# Patient Record
Sex: Male | Born: 1975 | Race: Black or African American | Hispanic: No | Marital: Married | Smoking: Former smoker
Health system: Southern US, Community
[De-identification: ages and names within clinical notes are randomized; demographics above are authoritative.]

## PROBLEM LIST (undated history)

## (undated) DIAGNOSIS — G8250 Quadriplegia, unspecified: Secondary | ICD-10-CM

## (undated) DIAGNOSIS — G40909 Epilepsy, unspecified, not intractable, without status epilepticus: Secondary | ICD-10-CM

## (undated) DIAGNOSIS — N319 Neuromuscular dysfunction of bladder, unspecified: Secondary | ICD-10-CM

## (undated) DIAGNOSIS — T883XXA Malignant hyperthermia due to anesthesia, initial encounter: Secondary | ICD-10-CM

## (undated) DIAGNOSIS — N210 Calculus in bladder: Secondary | ICD-10-CM

## (undated) DIAGNOSIS — G35 Multiple sclerosis: Secondary | ICD-10-CM

## (undated) DIAGNOSIS — J069 Acute upper respiratory infection, unspecified: Secondary | ICD-10-CM

## (undated) DIAGNOSIS — J45909 Unspecified asthma, uncomplicated: Secondary | ICD-10-CM

## (undated) DIAGNOSIS — F329 Major depressive disorder, single episode, unspecified: Secondary | ICD-10-CM

## (undated) DIAGNOSIS — R0602 Shortness of breath: Secondary | ICD-10-CM

## (undated) DIAGNOSIS — D649 Anemia, unspecified: Secondary | ICD-10-CM

## (undated) DIAGNOSIS — F0280 Dementia in other diseases classified elsewhere without behavioral disturbance: Secondary | ICD-10-CM

## (undated) DIAGNOSIS — N390 Urinary tract infection, site not specified: Secondary | ICD-10-CM

## (undated) DIAGNOSIS — G709 Myoneural disorder, unspecified: Secondary | ICD-10-CM

## (undated) DIAGNOSIS — J690 Pneumonitis due to inhalation of food and vomit: Secondary | ICD-10-CM

## (undated) DIAGNOSIS — F32A Depression, unspecified: Secondary | ICD-10-CM

## (undated) HISTORY — DX: Multiple sclerosis: G35

## (undated) HISTORY — DX: Dementia in other diseases classified elsewhere without behavioral disturbance: F02.80

## (undated) HISTORY — DX: Quadriplegia, unspecified: G82.50

## (undated) HISTORY — PX: SUPRAPUBIC CYSTOSTOMY: SHX2472

---

## 1999-10-02 ENCOUNTER — Emergency Department (HOSPITAL_COMMUNITY): Admission: EM | Admit: 1999-10-02 | Discharge: 1999-10-02 | Payer: Self-pay | Admitting: Emergency Medicine

## 2001-12-13 ENCOUNTER — Emergency Department (HOSPITAL_COMMUNITY): Admission: EM | Admit: 2001-12-13 | Discharge: 2001-12-13 | Payer: Self-pay | Admitting: Emergency Medicine

## 2002-08-10 ENCOUNTER — Emergency Department (HOSPITAL_COMMUNITY): Admission: EM | Admit: 2002-08-10 | Discharge: 2002-08-10 | Payer: Self-pay | Admitting: *Deleted

## 2002-08-10 ENCOUNTER — Encounter: Payer: Self-pay | Admitting: *Deleted

## 2002-08-13 ENCOUNTER — Emergency Department (HOSPITAL_COMMUNITY): Admission: EM | Admit: 2002-08-13 | Discharge: 2002-08-13 | Payer: Self-pay | Admitting: Emergency Medicine

## 2002-08-28 ENCOUNTER — Encounter: Payer: Self-pay | Admitting: Internal Medicine

## 2002-08-28 ENCOUNTER — Encounter: Admission: RE | Admit: 2002-08-28 | Discharge: 2002-08-28 | Payer: Self-pay | Admitting: Internal Medicine

## 2002-08-30 ENCOUNTER — Encounter: Payer: Self-pay | Admitting: Internal Medicine

## 2002-08-30 ENCOUNTER — Encounter: Admission: RE | Admit: 2002-08-30 | Discharge: 2002-08-30 | Payer: Self-pay | Admitting: Internal Medicine

## 2002-10-12 ENCOUNTER — Ambulatory Visit (HOSPITAL_COMMUNITY): Admission: RE | Admit: 2002-10-12 | Discharge: 2002-10-12 | Payer: Self-pay | Admitting: Neurology

## 2002-10-12 HISTORY — PX: LUMBAR PUNCTURE: SHX1985

## 2003-02-24 ENCOUNTER — Encounter: Payer: Self-pay | Admitting: Cardiology

## 2003-02-24 ENCOUNTER — Ambulatory Visit: Admission: RE | Admit: 2003-02-24 | Discharge: 2003-02-24 | Payer: Self-pay | Admitting: Neurology

## 2003-08-11 ENCOUNTER — Encounter (HOSPITAL_COMMUNITY): Admission: RE | Admit: 2003-08-11 | Discharge: 2003-11-09 | Payer: Self-pay | Admitting: Neurology

## 2003-12-10 ENCOUNTER — Ambulatory Visit: Payer: Self-pay

## 2004-01-12 ENCOUNTER — Encounter (HOSPITAL_COMMUNITY): Admission: RE | Admit: 2004-01-12 | Discharge: 2004-04-11 | Payer: Self-pay | Admitting: Neurology

## 2004-11-04 ENCOUNTER — Encounter: Admission: RE | Admit: 2004-11-04 | Discharge: 2004-11-04 | Payer: Self-pay | Admitting: Neurology

## 2005-08-09 ENCOUNTER — Encounter (INDEPENDENT_AMBULATORY_CARE_PROVIDER_SITE_OTHER): Payer: Self-pay | Admitting: *Deleted

## 2005-08-09 ENCOUNTER — Ambulatory Visit (HOSPITAL_COMMUNITY): Admission: RE | Admit: 2005-08-09 | Discharge: 2005-08-09 | Payer: Self-pay | Admitting: Psychiatry

## 2005-09-05 ENCOUNTER — Encounter (HOSPITAL_COMMUNITY): Admission: RE | Admit: 2005-09-05 | Discharge: 2005-12-04 | Payer: Self-pay | Admitting: Neurology

## 2005-12-05 ENCOUNTER — Encounter: Admission: RE | Admit: 2005-12-05 | Discharge: 2005-12-05 | Payer: Self-pay | Admitting: Neurology

## 2005-12-12 ENCOUNTER — Encounter (HOSPITAL_COMMUNITY): Admission: RE | Admit: 2005-12-12 | Discharge: 2006-03-12 | Payer: Self-pay | Admitting: Neurology

## 2006-03-13 ENCOUNTER — Encounter (HOSPITAL_COMMUNITY): Admission: RE | Admit: 2006-03-13 | Discharge: 2006-06-11 | Payer: Self-pay | Admitting: Neurology

## 2006-06-06 ENCOUNTER — Encounter: Admission: RE | Admit: 2006-06-06 | Discharge: 2006-06-06 | Payer: Self-pay | Admitting: Neurology

## 2006-06-13 ENCOUNTER — Encounter (HOSPITAL_COMMUNITY): Admission: RE | Admit: 2006-06-13 | Discharge: 2006-09-11 | Payer: Self-pay | Admitting: Neurology

## 2006-09-19 ENCOUNTER — Encounter (HOSPITAL_COMMUNITY): Admission: RE | Admit: 2006-09-19 | Discharge: 2006-12-18 | Payer: Self-pay | Admitting: Neurology

## 2007-02-06 ENCOUNTER — Encounter (HOSPITAL_COMMUNITY): Admission: RE | Admit: 2007-02-06 | Discharge: 2007-05-07 | Payer: Self-pay | Admitting: Neurology

## 2007-05-14 ENCOUNTER — Encounter (HOSPITAL_COMMUNITY): Admission: RE | Admit: 2007-05-14 | Discharge: 2007-08-12 | Payer: Self-pay | Admitting: Neurology

## 2007-08-08 ENCOUNTER — Encounter: Admission: RE | Admit: 2007-08-08 | Discharge: 2007-08-08 | Payer: Self-pay | Admitting: Neurology

## 2007-10-02 ENCOUNTER — Encounter (HOSPITAL_COMMUNITY): Admission: RE | Admit: 2007-10-02 | Discharge: 2007-12-31 | Payer: Self-pay | Admitting: Neurology

## 2008-01-21 ENCOUNTER — Encounter (HOSPITAL_COMMUNITY): Admission: RE | Admit: 2008-01-21 | Discharge: 2008-04-20 | Payer: Self-pay | Admitting: Neurology

## 2008-04-30 ENCOUNTER — Encounter: Admission: RE | Admit: 2008-04-30 | Discharge: 2008-04-30 | Payer: Self-pay | Admitting: Neurology

## 2008-05-05 ENCOUNTER — Encounter (HOSPITAL_COMMUNITY): Admission: RE | Admit: 2008-05-05 | Discharge: 2008-08-03 | Payer: Self-pay | Admitting: Neurology

## 2008-08-09 ENCOUNTER — Encounter (HOSPITAL_COMMUNITY): Admission: RE | Admit: 2008-08-09 | Discharge: 2008-11-07 | Payer: Self-pay | Admitting: Neurology

## 2008-11-18 ENCOUNTER — Encounter (HOSPITAL_COMMUNITY): Admission: RE | Admit: 2008-11-18 | Discharge: 2009-01-07 | Payer: Self-pay | Admitting: Neurology

## 2009-01-11 ENCOUNTER — Encounter (HOSPITAL_COMMUNITY): Admission: RE | Admit: 2009-01-11 | Discharge: 2009-04-11 | Payer: Self-pay | Admitting: Neurology

## 2009-05-26 ENCOUNTER — Encounter: Admission: RE | Admit: 2009-05-26 | Discharge: 2009-05-26 | Payer: Self-pay | Admitting: Neurology

## 2009-06-02 ENCOUNTER — Encounter (HOSPITAL_COMMUNITY): Admission: RE | Admit: 2009-06-02 | Discharge: 2009-08-31 | Payer: Self-pay | Admitting: Neurology

## 2009-06-17 ENCOUNTER — Ambulatory Visit: Payer: Self-pay | Admitting: Physical Medicine & Rehabilitation

## 2009-08-21 ENCOUNTER — Inpatient Hospital Stay (HOSPITAL_COMMUNITY): Admission: EM | Admit: 2009-08-21 | Discharge: 2009-08-22 | Payer: Self-pay | Admitting: Emergency Medicine

## 2009-09-01 ENCOUNTER — Encounter (HOSPITAL_COMMUNITY)
Admission: RE | Admit: 2009-09-01 | Discharge: 2009-11-30 | Payer: Self-pay | Source: Home / Self Care | Admitting: Neurology

## 2009-12-15 ENCOUNTER — Inpatient Hospital Stay (HOSPITAL_COMMUNITY): Admission: EM | Admit: 2009-12-15 | Discharge: 2009-06-20 | Payer: Self-pay | Admitting: Emergency Medicine

## 2009-12-21 ENCOUNTER — Encounter (HOSPITAL_COMMUNITY)
Admission: RE | Admit: 2009-12-21 | Discharge: 2010-02-07 | Payer: Self-pay | Source: Home / Self Care | Attending: Neurology | Admitting: Neurology

## 2010-01-29 ENCOUNTER — Encounter: Payer: Self-pay | Admitting: Neurology

## 2010-01-30 ENCOUNTER — Encounter: Payer: Self-pay | Admitting: Neurology

## 2010-02-14 ENCOUNTER — Encounter (HOSPITAL_COMMUNITY): Payer: Medicare PPO | Attending: Neurology

## 2010-02-14 DIAGNOSIS — G35 Multiple sclerosis: Secondary | ICD-10-CM | POA: Insufficient documentation

## 2010-02-14 DIAGNOSIS — Z79899 Other long term (current) drug therapy: Secondary | ICD-10-CM | POA: Insufficient documentation

## 2010-03-14 ENCOUNTER — Encounter (HOSPITAL_COMMUNITY): Payer: Medicare PPO | Attending: Neurology

## 2010-03-14 ENCOUNTER — Encounter (HOSPITAL_COMMUNITY): Payer: Medicare PPO

## 2010-03-14 DIAGNOSIS — G35 Multiple sclerosis: Secondary | ICD-10-CM | POA: Insufficient documentation

## 2010-03-14 DIAGNOSIS — Z79899 Other long term (current) drug therapy: Secondary | ICD-10-CM | POA: Insufficient documentation

## 2010-03-23 LAB — GLUCOSE, CAPILLARY
Glucose-Capillary: 127 mg/dL — ABNORMAL HIGH (ref 70–99)
Glucose-Capillary: 157 mg/dL — ABNORMAL HIGH (ref 70–99)
Glucose-Capillary: 166 mg/dL — ABNORMAL HIGH (ref 70–99)
Glucose-Capillary: 172 mg/dL — ABNORMAL HIGH (ref 70–99)
Glucose-Capillary: 218 mg/dL — ABNORMAL HIGH (ref 70–99)

## 2010-03-23 LAB — BASIC METABOLIC PANEL
BUN: 10 mg/dL (ref 6–23)
CO2: 25 mEq/L (ref 19–32)
Calcium: 9.3 mg/dL (ref 8.4–10.5)
Chloride: 108 mEq/L (ref 96–112)
Creatinine, Ser: 1.07 mg/dL (ref 0.4–1.5)
GFR calc Af Amer: >60 mL/min (ref >60)
GFR calc non Af Amer: >60 mL/min (ref >60)
Glucose, Bld: 169 mg/dL — ABNORMAL HIGH (ref 70–99)
Potassium: 4.7 mEq/L (ref 3.5–5.1)
Sodium: 141 mEq/L (ref 135–145)

## 2010-03-23 LAB — CBC
HCT: 37 % — ABNORMAL LOW (ref 39.0–52.0)
Hemoglobin: 13.3 g/dL (ref 13.0–17.0)
MCH: 29.9 pg (ref 26.0–34.0)
MCHC: 35.9 g/dL (ref 30.0–36.0)
MCV: 83.1 fL (ref 78.0–100.0)
Platelets: 261 10*3/uL (ref 150–400)
RBC: 4.45 MIL/uL (ref 4.22–5.81)
RDW: 13.7 % (ref 11.5–15.5)
WBC: 16.5 10*3/uL — ABNORMAL HIGH (ref 4.0–10.5)

## 2010-03-24 LAB — CBC
HCT: 35.2 % — ABNORMAL LOW (ref 39.0–52.0)
MCH: 28.6 pg (ref 26.0–34.0)
MCHC: 34.4 g/dL (ref 30.0–36.0)
MCV: 83.1 fL (ref 78.0–100.0)
Platelets: 262 10*3/uL (ref 150–400)
RBC: 4.19 MIL/uL — ABNORMAL LOW (ref 4.22–5.81)
RDW: 13.5 % (ref 11.5–15.5)
RDW: 13.6 % (ref 11.5–15.5)
WBC: 20.4 10*3/uL — ABNORMAL HIGH (ref 4.0–10.5)

## 2010-03-24 LAB — COMPREHENSIVE METABOLIC PANEL
ALT: 34 U/L (ref 0–53)
Albumin: 3.2 g/dL — ABNORMAL LOW (ref 3.5–5.2)
Alkaline Phosphatase: 89 U/L (ref 39–117)
Chloride: 107 mEq/L (ref 96–112)
Glucose, Bld: 98 mg/dL (ref 70–99)
Potassium: 3.8 mEq/L (ref 3.5–5.1)
Sodium: 139 mEq/L (ref 135–145)
Total Bilirubin: 0.6 mg/dL (ref 0.3–1.2)
Total Protein: 6.2 g/dL (ref 6.0–8.3)

## 2010-03-24 LAB — URINE MICROSCOPIC-ADD ON

## 2010-03-24 LAB — BASIC METABOLIC PANEL
BUN: 9 mg/dL (ref 6–23)
CO2: 22 mEq/L (ref 19–32)
Calcium: 8.9 mg/dL (ref 8.4–10.5)
Chloride: 104 mEq/L (ref 96–112)
Creatinine, Ser: 1.02 mg/dL (ref 0.4–1.5)
GFR calc Af Amer: >60 mL/min (ref >60)
Glucose, Bld: 112 mg/dL — ABNORMAL HIGH (ref 70–99)

## 2010-03-24 LAB — DIFFERENTIAL
Basophils Absolute: 0 10*3/uL (ref 0.0–0.1)
Eosinophils Absolute: 0.3 10*3/uL (ref 0.0–0.7)
Eosinophils Relative: 1 % (ref 0–5)
Lymphs Abs: 3.5 10*3/uL (ref 0.7–4.0)
Monocytes Absolute: 2.8 10*3/uL — ABNORMAL HIGH (ref 0.1–1.0)
Neutrophils Relative %: 74 % (ref 43–77)

## 2010-03-24 LAB — URINALYSIS, ROUTINE W REFLEX MICROSCOPIC
Bilirubin Urine: NEGATIVE
Glucose, UA: NEGATIVE mg/dL
Ketones, ur: 15 mg/dL — AB
Nitrite: POSITIVE — AB
Specific Gravity, Urine: 1.021 (ref 1.005–1.030)
pH: 5.5 (ref 5.0–8.0)

## 2010-03-24 LAB — URINE CULTURE: Colony Count: >=100000

## 2010-03-24 LAB — LIPID PANEL
Cholesterol: 151 mg/dL (ref 0–200)
Total CHOL/HDL Ratio: 3.1 RATIO
VLDL: 11 mg/dL (ref 0–40)

## 2010-03-24 LAB — CULTURE, BLOOD (ROUTINE X 2)

## 2010-03-24 LAB — TSH: TSH: 1.126 u[IU]/mL (ref 0.350–4.500)

## 2010-03-24 LAB — GLUCOSE, CAPILLARY: Glucose-Capillary: 90 mg/dL (ref 70–99)

## 2010-03-27 LAB — COMPREHENSIVE METABOLIC PANEL
Albumin: 3.3 g/dL — ABNORMAL LOW (ref 3.5–5.2)
Alkaline Phosphatase: 90 U/L (ref 39–117)
BUN: 10 mg/dL (ref 6–23)
BUN: 9 mg/dL (ref 6–23)
Chloride: 107 mEq/L (ref 96–112)
Chloride: 108 mEq/L (ref 96–112)
Creatinine, Ser: 0.91 mg/dL (ref 0.4–1.5)
GFR calc non Af Amer: >60 mL/min (ref >60)
Glucose, Bld: 134 mg/dL — ABNORMAL HIGH (ref 70–99)
Glucose, Bld: 152 mg/dL — ABNORMAL HIGH (ref 70–99)
Potassium: 4.6 mEq/L (ref 3.5–5.1)
Total Bilirubin: 0.6 mg/dL (ref 0.3–1.2)
Total Bilirubin: 0.7 mg/dL (ref 0.3–1.2)
Total Protein: 7.3 g/dL (ref 6.0–8.3)

## 2010-03-27 LAB — CBC
HCT: 38.6 % — ABNORMAL LOW (ref 39.0–52.0)
HCT: 41.7 % (ref 39.0–52.0)
Hemoglobin: 12.9 g/dL — ABNORMAL LOW (ref 13.0–17.0)
Hemoglobin: 14 g/dL (ref 13.0–17.0)
Hemoglobin: 14.5 g/dL (ref 13.0–17.0)
MCV: 87.5 fL (ref 78.0–100.0)
Platelets: 295 10*3/uL (ref 150–400)
RBC: 4.92 MIL/uL (ref 4.22–5.81)
RDW: 13.2 % (ref 11.5–15.5)
RDW: 13.5 % (ref 11.5–15.5)
RDW: 14 % (ref 11.5–15.5)
WBC: 11.6 10*3/uL — ABNORMAL HIGH (ref 4.0–10.5)
WBC: 16.3 10*3/uL — ABNORMAL HIGH (ref 4.0–10.5)

## 2010-03-27 LAB — URINE CULTURE: Special Requests: NEGATIVE

## 2010-03-27 LAB — DIFFERENTIAL
Basophils Absolute: 0.1 10*3/uL (ref 0.0–0.1)
Lymphocytes Relative: 24 % (ref 12–46)
Monocytes Absolute: 0.8 10*3/uL (ref 0.1–1.0)
Neutro Abs: 7.2 10*3/uL (ref 1.7–7.7)

## 2010-03-27 LAB — URINALYSIS, ROUTINE W REFLEX MICROSCOPIC
Nitrite: NEGATIVE
Specific Gravity, Urine: 1.033 — ABNORMAL HIGH (ref 1.005–1.030)
Urobilinogen, UA: 0.2 mg/dL (ref 0.0–1.0)

## 2010-03-27 LAB — URINALYSIS, MICROSCOPIC ONLY
Hgb urine dipstick: NEGATIVE
Leukocytes, UA: NEGATIVE
Nitrite: POSITIVE — AB
Protein, ur: NEGATIVE mg/dL
Specific Gravity, Urine: 1.02 (ref 1.005–1.030)
Urobilinogen, UA: 0.2 mg/dL (ref 0.0–1.0)

## 2010-03-27 LAB — BASIC METABOLIC PANEL
Calcium: 9.2 mg/dL (ref 8.4–10.5)
GFR calc Af Amer: >60 mL/min (ref >60)
GFR calc non Af Amer: >60 mL/min (ref >60)
Glucose, Bld: 93 mg/dL (ref 70–99)
Sodium: 138 mEq/L (ref 135–145)

## 2010-03-27 LAB — MAGNESIUM: Magnesium: 2.5 mg/dL (ref 1.5–2.5)

## 2010-03-27 LAB — PREALBUMIN: Prealbumin: 31.1 mg/dL (ref 18.0–45.0)

## 2010-03-29 LAB — DIFFERENTIAL
Basophils Absolute: 0.1 10*3/uL (ref 0.0–0.1)
Basophils Relative: 1 % (ref 0–1)
Eosinophils Absolute: 1.4 10*3/uL — ABNORMAL HIGH (ref 0.0–0.7)
Monocytes Relative: 6 % (ref 3–12)
Neutro Abs: 6 10*3/uL (ref 1.7–7.7)
Neutrophils Relative %: 53 % (ref 43–77)

## 2010-03-29 LAB — COMPREHENSIVE METABOLIC PANEL
ALT: 36 U/L (ref 0–53)
Alkaline Phosphatase: 91 U/L (ref 39–117)
BUN: 8 mg/dL (ref 6–23)
CO2: 24 mEq/L (ref 19–32)
Chloride: 104 mEq/L (ref 96–112)
Glucose, Bld: 98 mg/dL (ref 70–99)
Potassium: 4.1 mEq/L (ref 3.5–5.1)
Sodium: 138 mEq/L (ref 135–145)
Total Bilirubin: 0.7 mg/dL (ref 0.3–1.2)
Total Protein: 7.9 g/dL (ref 6.0–8.3)

## 2010-03-29 LAB — CBC
HCT: 43.1 % (ref 39.0–52.0)
Hemoglobin: 15 g/dL (ref 13.0–17.0)
RBC: 5.01 MIL/uL (ref 4.22–5.81)
WBC: 11.4 10*3/uL — ABNORMAL HIGH (ref 4.0–10.5)

## 2010-04-11 ENCOUNTER — Encounter (HOSPITAL_COMMUNITY): Payer: Medicare PPO

## 2010-04-18 LAB — CD4/CD8 (T-HELPER/T-SUPPRESSOR CELL)
CD4 absolute: 1630 /uL (ref 500–1900)
CD4%: 44 % (ref 30.0–60.0)
Total lymphocyte count: 3670 /uL (ref 1000–4000)

## 2010-04-18 LAB — COMPREHENSIVE METABOLIC PANEL
Alkaline Phosphatase: 91 U/L (ref 39–117)
BUN: 11 mg/dL (ref 6–23)
Chloride: 107 mEq/L (ref 96–112)
Creatinine, Ser: 0.85 mg/dL (ref 0.4–1.5)
GFR calc non Af Amer: >60 mL/min (ref >60)
Glucose, Bld: 89 mg/dL (ref 70–99)
Potassium: 4.2 mEq/L (ref 3.5–5.1)
Total Bilirubin: 0.7 mg/dL (ref 0.3–1.2)

## 2010-04-18 LAB — DIFFERENTIAL
Basophils Absolute: 0.2 10*3/uL — ABNORMAL HIGH (ref 0.0–0.1)
Basophils Relative: 2 % — ABNORMAL HIGH (ref 0–1)
Lymphocytes Relative: 32 % (ref 12–46)
Neutro Abs: 4.9 10*3/uL (ref 1.7–7.7)
Neutrophils Relative %: 43 % (ref 43–77)

## 2010-04-18 LAB — CBC
Hemoglobin: 14.1 g/dL (ref 13.0–17.0)
MCHC: 34.4 g/dL (ref 30.0–36.0)
RBC: 4.76 MIL/uL (ref 4.22–5.81)
RDW: 13.3 % (ref 11.5–15.5)

## 2010-04-18 LAB — MISCELLANEOUS TEST

## 2010-04-20 ENCOUNTER — Encounter (HOSPITAL_COMMUNITY): Payer: Medicare PPO

## 2010-05-08 ENCOUNTER — Encounter (HOSPITAL_COMMUNITY): Payer: Medicare PPO

## 2010-05-23 ENCOUNTER — Encounter (HOSPITAL_COMMUNITY): Payer: Medicare PPO

## 2010-05-26 NOTE — Op Note (Signed)
   NAME:  MEHTAAB, MAYEDA NO.:  [id number]   MEDICAL RECORD NO.:  [id number]                   PATIENT TYPE:  OUT   LOCATION:  MDC                                  FACILITY:  [facility]   PHYSICIAN:  Marlan Palau, M.D.               DATE OF BIRTH:  1975/06/08   DATE OF PROCEDURE:  10/12/2002  DATE OF DISCHARGE:                                 OPERATIVE REPORT   PROCEDURE PERFORMED:  Lumbar puncture.   INDICATIONS FOR PROCEDURE:  The patient is a 35 year old patient with a  history of progressive gait disturbance, dizziness with multiple white  matter lesions by MRI scan.  Patient being evaluated for possible multiple  sclerosis.   DESCRIPTION OF PROCEDURE:  The lumbar puncture was performed with the  patient in the fetal position on the left side.  The low back was cleaned  with Betadine solution.  Approximately 2 mL of 1% Xylocaine with epinephrine  was used a local anesthetic.  A 20 guage spinal needle was inserted in the  L3-4 interspace and approximately 18mL of clear colorless spinal fluid was  removed for testing. Opening pressure was 150 mmHg.  Again spinal fluid was  clear and colorless.  Tube #1 was sent for VDRL, cryptococcal antigen,  angiotensin converting enzyme level.  Tube #2 was sent for oligoclonal  banding, IgG, albumin ratio.  Tube #3 was sent for cells, differential,  glucose, protein, tube #4 was sent for Lyme antibody panel.  Blood work was  sent for ANA, rheumatoid factor, lupus anticoagulant, antiphospholipid  antibody panel, sed rate, HIV antibodies.  There were no complications to  the above procedure.  The patient tolerated the procedure well.                                               Marlan Palau, M.D.    CKW/MEDQ  D:  10/12/2002  T:  10/12/2002  Job:  [id number]

## 2010-05-30 ENCOUNTER — Encounter (HOSPITAL_COMMUNITY)
Admission: RE | Admit: 2010-05-30 | Discharge: 2010-05-30 | Disposition: A | Discharge status: Home / Self Care | Payer: Medicare PPO | Source: Ambulatory Visit | Attending: Neurology | Admitting: Neurology

## 2010-05-30 DIAGNOSIS — G35 Multiple sclerosis: Secondary | ICD-10-CM | POA: Insufficient documentation

## 2010-06-27 ENCOUNTER — Encounter (HOSPITAL_COMMUNITY): Payer: Medicare PPO

## 2010-07-03 ENCOUNTER — Encounter (HOSPITAL_COMMUNITY): Payer: Medicare PPO | Attending: Neurology

## 2010-07-03 DIAGNOSIS — G35 Multiple sclerosis: Secondary | ICD-10-CM | POA: Insufficient documentation

## 2010-07-06 ENCOUNTER — Emergency Department (HOSPITAL_COMMUNITY)
Admission: EM | Admit: 2010-07-06 | Discharge: 2010-07-06 | Disposition: A | Discharge status: Home / Self Care | Payer: Medicare PPO | Attending: Emergency Medicine | Admitting: Emergency Medicine

## 2010-07-06 DIAGNOSIS — G35 Multiple sclerosis: Secondary | ICD-10-CM | POA: Insufficient documentation

## 2010-07-06 DIAGNOSIS — Z79899 Other long term (current) drug therapy: Secondary | ICD-10-CM | POA: Insufficient documentation

## 2010-07-06 DIAGNOSIS — K089 Disorder of teeth and supporting structures, unspecified: Secondary | ICD-10-CM | POA: Insufficient documentation

## 2010-07-25 ENCOUNTER — Other Ambulatory Visit: Payer: Self-pay | Admitting: Neurology

## 2010-07-25 DIAGNOSIS — G35 Multiple sclerosis: Secondary | ICD-10-CM

## 2010-07-31 ENCOUNTER — Encounter (HOSPITAL_COMMUNITY): Payer: Medicare PPO

## 2010-08-04 ENCOUNTER — Encounter (HOSPITAL_COMMUNITY): Payer: Medicare PPO

## 2010-08-10 ENCOUNTER — Ambulatory Visit
Admission: RE | Admit: 2010-08-10 | Discharge: 2010-08-10 | Disposition: A | Discharge status: Home / Self Care | Payer: Medicare PPO | Source: Ambulatory Visit | Attending: Neurology | Admitting: Neurology

## 2010-08-10 DIAGNOSIS — G35 Multiple sclerosis: Secondary | ICD-10-CM

## 2010-08-10 MED ORDER — GADOBENATE DIMEGLUMINE 529 MG/ML IV SOLN
18.0000 mL | Freq: Once | INTRAVENOUS | Status: AC | PRN
  Administered 2010-08-10: 18 mL via INTRAVENOUS

## 2010-10-03 LAB — CD4/CD8 (T-HELPER/T-SUPPRESSOR CELL)
CD4 absolute: 1190
CD4%: 39
CD8tox: 28
Total lymphocyte count: 3050

## 2010-10-03 LAB — COMPREHENSIVE METABOLIC PANEL
ALT: 21
BUN: 10
CO2: 26
Calcium: 9.1
GFR calc non Af Amer: >60
Glucose, Bld: 87
Sodium: 136

## 2010-10-03 LAB — CBC
MCV: 83.1
Platelets: 299
WBC: 10.1

## 2010-10-03 LAB — DIFFERENTIAL
Basophils Relative: 1
Eosinophils Absolute: 0.9 — ABNORMAL HIGH
Lymphs Abs: 3.3
Neutrophils Relative %: 50

## 2010-10-15 ENCOUNTER — Emergency Department (HOSPITAL_COMMUNITY)
Admission: EM | Admit: 2010-10-15 | Discharge: 2010-10-15 | Disposition: A | Discharge status: Home / Self Care | Payer: Medicare HMO | Attending: Emergency Medicine | Admitting: Emergency Medicine

## 2010-10-15 DIAGNOSIS — G35 Multiple sclerosis: Secondary | ICD-10-CM | POA: Insufficient documentation

## 2010-10-15 DIAGNOSIS — R339 Retention of urine, unspecified: Secondary | ICD-10-CM | POA: Insufficient documentation

## 2010-10-15 DIAGNOSIS — Z993 Dependence on wheelchair: Secondary | ICD-10-CM | POA: Insufficient documentation

## 2010-10-15 DIAGNOSIS — R350 Frequency of micturition: Secondary | ICD-10-CM | POA: Insufficient documentation

## 2010-10-15 LAB — CBC
Platelets: 302 10*3/uL (ref 150–400)
RDW: 13 % (ref 11.5–15.5)
WBC: 8.9 10*3/uL (ref 4.0–10.5)

## 2010-10-15 LAB — URINALYSIS, ROUTINE W REFLEX MICROSCOPIC
Ketones, ur: NEGATIVE mg/dL
Leukocytes, UA: NEGATIVE
Nitrite: NEGATIVE
Protein, ur: NEGATIVE mg/dL
pH: 6 (ref 5.0–8.0)

## 2010-10-15 LAB — BASIC METABOLIC PANEL
BUN: 9 mg/dL (ref 6–23)
Chloride: 100 mEq/L (ref 96–112)
Creatinine, Ser: 0.89 mg/dL (ref 0.50–1.35)
GFR calc Af Amer: >90 mL/min (ref >90)
GFR calc non Af Amer: >90 mL/min (ref >90)
Potassium: 3.7 mEq/L (ref 3.5–5.1)

## 2010-10-15 LAB — DIFFERENTIAL
Basophils Absolute: 0.1 10*3/uL (ref 0.0–0.1)
Eosinophils Absolute: 0.6 10*3/uL (ref 0.0–0.7)
Eosinophils Relative: 7 % — ABNORMAL HIGH (ref 0–5)

## 2010-10-15 LAB — OCCULT BLOOD, POC DEVICE: Fecal Occult Bld: POSITIVE

## 2010-10-16 LAB — URINE CULTURE: Colony Count: NO GROWTH

## 2010-10-20 ENCOUNTER — Other Ambulatory Visit (HOSPITAL_COMMUNITY): Payer: Medicare PPO

## 2010-10-20 ENCOUNTER — Emergency Department (HOSPITAL_COMMUNITY)
Admission: EM | Admit: 2010-10-20 | Discharge: 2010-10-20 | Disposition: A | Discharge status: Other Acute Inpatient Hospital | Payer: Medicare PPO | Source: Home / Self Care | Attending: Emergency Medicine | Admitting: Emergency Medicine

## 2010-10-20 ENCOUNTER — Inpatient Hospital Stay (HOSPITAL_COMMUNITY)
Admission: AD | Admit: 2010-10-20 | Discharge: 2010-10-31 | DRG: 060 | Disposition: A | Discharge status: Home Health | Payer: Medicare PPO | Source: Other Acute Inpatient Hospital | Attending: Neurology | Admitting: Neurology

## 2010-10-20 ENCOUNTER — Emergency Department (HOSPITAL_COMMUNITY): Payer: Medicare PPO

## 2010-10-20 DIAGNOSIS — G35 Multiple sclerosis: Secondary | ICD-10-CM | POA: Insufficient documentation

## 2010-10-20 DIAGNOSIS — Z79899 Other long term (current) drug therapy: Secondary | ICD-10-CM

## 2010-10-20 DIAGNOSIS — R5381 Other malaise: Secondary | ICD-10-CM | POA: Diagnosis present

## 2010-10-20 DIAGNOSIS — R4789 Other speech disturbances: Secondary | ICD-10-CM | POA: Insufficient documentation

## 2010-10-20 DIAGNOSIS — Z993 Dependence on wheelchair: Secondary | ICD-10-CM

## 2010-10-20 LAB — CBC
HCT: 40.3 % (ref 39.0–52.0)
Hemoglobin: 14.1 g/dL (ref 13.0–17.0)
MCH: 29.3 pg (ref 26.0–34.0)
MCHC: 35 g/dL (ref 30.0–36.0)
MCV: 83.6 fL (ref 78.0–100.0)
RBC: 4.82 MIL/uL (ref 4.22–5.81)

## 2010-10-20 LAB — COMPREHENSIVE METABOLIC PANEL
AST: 26 U/L (ref 0–37)
BUN: 8 mg/dL (ref 6–23)
CO2: 26 mEq/L (ref 19–32)
Chloride: 102 mEq/L (ref 96–112)
Creatinine, Ser: 0.74 mg/dL (ref 0.50–1.35)
GFR calc Af Amer: >90 mL/min (ref >90)
GFR calc non Af Amer: >90 mL/min (ref >90)
Glucose, Bld: 116 mg/dL — ABNORMAL HIGH (ref 70–99)
Total Bilirubin: 0.2 mg/dL — ABNORMAL LOW (ref 0.3–1.2)

## 2010-10-20 LAB — DIFFERENTIAL
Lymphs Abs: 1.9 10*3/uL (ref 0.7–4.0)
Monocytes Absolute: 0.8 10*3/uL (ref 0.1–1.0)
Monocytes Relative: 9 % (ref 3–12)
Neutro Abs: 5.6 10*3/uL (ref 1.7–7.7)
Neutrophils Relative %: 63 % (ref 43–77)

## 2010-10-20 LAB — URINALYSIS, ROUTINE W REFLEX MICROSCOPIC
Bilirubin Urine: NEGATIVE
Glucose, UA: NEGATIVE mg/dL
Hgb urine dipstick: NEGATIVE
Ketones, ur: NEGATIVE mg/dL
Leukocytes, UA: NEGATIVE
Protein, ur: NEGATIVE mg/dL
pH: 6 (ref 5.0–8.0)

## 2010-10-22 NOTE — H&P (Signed)
NAMEMORTY, ORTWEIN NO.:  [id number]  MEDICAL RECORD NO.:  [id number]  LOCATION:  [facility]LED                         FACILITY:  [facility]  PHYSICIAN:  Weston Settle, MD   DATE OF BIRTH:  02-Apr-1975  DATE OF ADMISSION:  10/20/2010 DATE OF DISCHARGE:  10/20/2010                             HISTORY & PHYSICAL   HISTORY OF PRESENT ILLNESS:  The patient is a 35 year old African American male who is admitted to our service.  No family is here to corroborate details.  The patient is a reliable historian and the ER notes also gave further history.  According to the family, the patient has had a few-week history of progressively getting worse in a nonspecific diffuse manner.  He used to be able to use his upper extremities and feed himself, but he has had more difficulty doing that recently and has had more difficulty with his speech.  He has a history of multiple sclerosis diagnosed 5 years ago.  He says that the spinal tap at that time was positive, although we have no records.  Initially, he was on Copaxone, but he had some relapses which caused his physicians to switch him to [place].  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 For the past 3 years, the patient has been wheelchair bound with inability to even use a walker on his own.  He has also had increased difficulty with urination and expelling his urine recently.  Upon arrival here, the patient had an MRI of the brain which I reviewed personally.  It indicates periventricular and subcortical white matter changes which are relatively unchanged since his prior MRI in August 2011.  There are multiple corpus callosal lesions as well in a Dawson finger-type pattern.  MRI of the cervical spine was also performed, but it is significantly degraded by motion artifact and difficult  to analyze.  According to the prior cervical spine MRI in August, 2011, there were some lesions in the spinal cord, which were rather vague, but radiologist thought they were present.  Unfortunately on this admission, the patient could not tolerate the contrast imaging and we do not have any postcontrast images.  His CBC and complete metabolic panel and urinalysis are normal.  The patient has not been sick recently.  PAST MEDICAL HISTORY:  Multiple sclerosis.  PAST SURGICAL HISTORY:  Negative.  MEDICATIONS AT HOME: 1. Adderall. 2. Baclofen. 3. Prozac. 4. Tysabri until recently.  ALLERGIES:  No known drug allergies.  SOCIAL HISTORY:  The patient does not smoke, drink, or use any illicit drugs.  FAMILY HISTORY:  Noncontributory.  REVIEW OF SYSTEMS:  No fever, no meningismus, no diplopia, no dysphagia, no chest pain, no shortness of breath, no diarrhea or constipation.  All other review of systems are negative.  PHYSICAL EXAMINATION:  VITAL SIGNS:  His blood pressure was 117/75, pulse of 87, temperature 97.5. HEART:  Regular rate and rhythm.  S1, S2.  No murmurs. LUNGS:  Clear to auscultation.  There are no carotid bruits. NEUROLOGIC:  He  is very sleepy, but does awaken to stimulation.  He is disoriented to the year, the month, or the city, but knows the Economist.  His language is fluent.  Comprehension, naming, and repetition are intact.  Pupils are equal and reactive.  I do not see any obvious afferent pupillary defect.  Extraocular movements are intact. Face appears to have slightly left lower facial droop.  Tongue is midline.  Palate raises symmetrically.  There is no atrophy or fasciculations in the tongue.  He is able to have about a 4/5 strength in the bilateral upper extremities.  Lower extremities are about 3/5 in strength at best in all muscle groups.  He does have significant clonus at the ankles and +3 reflexes throughout the lower and upper extremities.  There  are bilateral Babinski signs present and there is equivocal Hoffman sign present bilaterally.  Sensory testing is grossly intact and coordination testing is impaired with some dysmetria bilaterally.  Gait testing was deferred at this time due to safety reasons.  ASSESSMENT AND PLAN:  This is a 35 year old with multiple sclerosis, who has progressed rather rapidly over the last 5 years and is wheelchair bound.  This admission is not consistent with any specific clear exacerbation of his multiple sclerosis and his MRI appears to be relatively stable compared to the prior MRI, although he could not tolerate post-contrast imaging.  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  Due to this reason and the fact that there is no clear exacerbation referable to a specific area of the nervous system, I hesitate to initiate empiric intravenous steroids.  I doubt that his current decompensation is related to the presence of progressive multifocal leukoencephalopathy.  However, strong consideration can be given to initiating plasmapheresis depending on his clinical course here in the hospital.  I will recommend extensive physical therapy and occupational therapy and speech therapy.  I will increase his baclofen to a maximum dose of 20 mg 4 times a day.  I will continue his Prozac at 20 mg a day.  I will also continue his Adderall at 20 mg every morning.  These can be readjusted based on his clinical course.  Due to the fact that he has some difficulty initiating urination and may have some urinary retention, I will initiate a Foley catheter for him.  At the present time, there is no evidence that he has any urinary tract infection.          ______________________________ Weston Settle, MD     SE/MEDQ  D:  10/20/2010  T:  10/21/2010   Job:  [id number]  Electronically Signed by Weston Settle MD on 10/22/2010 11:27:25 PM

## 2010-10-23 DIAGNOSIS — G35 Multiple sclerosis: Secondary | ICD-10-CM

## 2010-10-24 LAB — CBC
MCH: 28.6 pg (ref 26.0–34.0)
MCHC: 33.7 g/dL (ref 30.0–36.0)
Platelets: 294 10*3/uL (ref 150–400)
RBC: 4.93 MIL/uL (ref 4.22–5.81)

## 2010-10-24 LAB — BASIC METABOLIC PANEL
CO2: 24 mEq/L (ref 19–32)
Calcium: 9.9 mg/dL (ref 8.4–10.5)
GFR calc non Af Amer: >90 mL/min (ref >90)
Potassium: 4.8 mEq/L (ref 3.5–5.1)
Sodium: 141 mEq/L (ref 135–145)

## 2010-10-27 LAB — URINE CULTURE: Special Requests: NEGATIVE

## 2010-10-31 ENCOUNTER — Emergency Department (HOSPITAL_COMMUNITY)
Admission: EM | Admit: 2010-10-31 | Discharge: 2010-11-01 | Disposition: A | Discharge status: Home / Self Care | Payer: Medicare HMO | Attending: Emergency Medicine | Admitting: Emergency Medicine

## 2010-10-31 DIAGNOSIS — G35 Multiple sclerosis: Secondary | ICD-10-CM | POA: Insufficient documentation

## 2010-10-31 DIAGNOSIS — Z79899 Other long term (current) drug therapy: Secondary | ICD-10-CM | POA: Insufficient documentation

## 2010-10-31 DIAGNOSIS — R339 Retention of urine, unspecified: Secondary | ICD-10-CM | POA: Insufficient documentation

## 2010-10-31 DIAGNOSIS — N390 Urinary tract infection, site not specified: Secondary | ICD-10-CM | POA: Insufficient documentation

## 2010-10-31 DIAGNOSIS — R319 Hematuria, unspecified: Secondary | ICD-10-CM | POA: Insufficient documentation

## 2010-10-31 LAB — URINE MICROSCOPIC-ADD ON

## 2010-10-31 LAB — URINALYSIS, ROUTINE W REFLEX MICROSCOPIC
Specific Gravity, Urine: 1.011 (ref 1.005–1.030)
pH: 6.5 (ref 5.0–8.0)

## 2010-11-04 LAB — URINE CULTURE

## 2010-11-05 NOTE — Consult Note (Signed)
  NAME:  Terry Palmer, Terry Palmer NO.:  [id number]  MEDICAL RECORD NO.:  [id number]  LOCATION:                                 FACILITY:  PHYSICIAN:  Thana Farr, MD    DATE OF BIRTH:  12-14-1975  DATE OF CONSULTATION: DATE OF DISCHARGE:                                CONSULTATION   ADDENDUM  Due to complications with transportation, it was necessary to keep the patient in the hospital for one extended day.  During the extended day of hospitalization, the patient had no alterations in physical exam and no increasing weakness.  In fact, the patient states that he feels slightly stronger in bilateral upper extremities.  He is eating well, urinating, and defecating without any difficulty.  The patient will be discharged home as initially dictated today at 4 p.m.     Felicie Morn, PA-C   ______________________________ Thana Farr, MD    DS/MEDQ  D:  10/31/2010  T:  11/01/2010  Job:  [id number]  Electronically Signed by Felicie Morn PA-C on 11/02/2010 10:43:32 AM Electronically Signed by Thana Farr MD on 11/05/2010 05:21:48 PM

## 2010-11-27 NOTE — Discharge Summary (Signed)
Terry Palmer, Terry Palmer NO.:  [id number]  MEDICAL RECORD NO.:  [id number]  LOCATION:  3010                         FACILITY:  [facility]  PHYSICIAN:  Thana Farr, MD    DATE OF BIRTH:  07-12-1975  DATE OF ADMISSION:  10/20/2010 DATE OF DISCHARGE:  10/30/2010                              DISCHARGE SUMMARY   ADMITTING DIAGNOSIS:  Multiple sclerosis exacerbation.  DISCHARGE DIAGNOSIS:  Multiple sclerosis exacerbation.  HISTORY OF PRESENT ILLNESS:  This is a 35 year old male who was admitted to [facility].  The patient has a known history of multiple sclerosis and is currently on medications Adderall, baclofen, Prozac, and Tysabri until recently.  The patient is followed by [facility] as an outpatient.  The patient was doing well until few days prior to admission when he noticed his upper extremities were getting weaker and he had difficulty feeding himself.  He was also having progressive difficulty with his speech.  As stated he does have a history of multiple sclerosis diagnosed approximately 5 years ago. Initially the patient was on Copaxone but had some relapses and then was switched to Tysabri.  Recently he was found to be positive for JC virus and was about to be switched to Betaseron but had not yet had his first dose.  Over the past 3 years, the patient has been wheelchair bound with inability even using a walker on his own.  Upon arrival to the [facility], the patient's MRI of the brain indicate periventricular and subcortical white matter changes relatively unchanged since prior MRI in August 2011.  MRI of cervical spine was also performed but had significant degradation secondary to motion.  According to the prior cervical MRI in August 2011, there were some lesions in the spinal cord, but it was rather vague in the radiologist presentation.  During hospitalization, the patient received 6 doses of 1 g of  Solu- Medrol with significant improvement.  The patient was admitted on a home dose of baclofen 20 mg b.i.d. but was changed while hospitalized to baclofen 10 mg t.i.d. with improvement of lower extremity spasticity. Physical therapy saw the patient during hospitalization and noted recommendations of outpatient rehabilitation at [facility].  Dietitian saw the patient and recommended possibility of using nutritional supplements to prevent muscle mass loss if the patient continues to have difficulty with swallowing in the future.  Inpatient rehab evaluated the patient while he was on the floor, however, the patient did not meet the requirements needed for inpatient rehabilitation as he could not tolerate the duration of therapy that would be needed for inpatient rehab which consisted of 3 hours of therapy for 5 days a week.  Speech therapy saw the patient while in the hospital and obtained a FEES.  Their recommendation at that time was to place the patient on a dysphagia II thin liquid diet.  During FEES, no overt aspiration was noted.  Social worker worked very closely with the patient and family and recommended the possibility of both a skilled nursing facility versus home health.  At this time, family has decided that home health would be their desire and the patient would  be most desirable.  DISCHARGE PHYSICAL EXAMINATION:  The patient is awake.  He is oriented. He has slight facial asymmetry on the left aspect of his corner of his mouth.  Smile is fairly symmetrical.  Speech is slightly dysarthric, but he is able to hold conversations.  He shows no aphasia.  Pupils are equal, round, reactive to light.  Extraocular movements are grossly intact.  He does have slightly saccadic eye movement but is able to move his extraocular movements in all 6 cardinal gazes.  Vision is grossly intact.  The patient is able to hold his head upright without any difficulty.   Shoulder shrug head turn is within normal limits.  [name] strength in his right arm, his antigravity, however, with finger-to-nose notes that he gets weak very easily with the proximal aspect of his shoulder strength.  He does show some dysmetria with finger-nose on his right secondary to weakness.  His left arm is antigravity but weaker than his right.  He is in a cock-up wrist splint on his left hand to prevent spasticity.  His grip is slightly weaker on his left than his right, but he is moving bilateral upper extremities spontaneously and purposefully.  He states sensation is grossly intact throughout.  The patient shows no spontaneous movement of his lower extremities.  He does have spastic jerking of his lower extremities at times.  He has got significant amount of muscle atrophy in bilateral lower extremities.  Sensation is grossly intact.  Deep tendon reflexes are brisk throughout.  He does have upgoing toes bilaterally and clonus of the patella and Achilles bilaterally.  DISCHARGE MEDICATIONS:  The patient will be discharged on the following: 1. Automatic hospital bed.  Diagnosis for advanced multiple sclerosis     as the patient requires maximal to total assist for transfers,     ADLs. 2. Baclofen 10 mg t.i.d. by mouth. 3. Amphetamine/dextroamphetamine 20 mg 1 tab by mouth daily. 4. Fiber supplements. 5. Fish oil 1 capsule by mouth daily. 6. Fluoxetine 20 mg 1 cap by mouth daily. 7. Multivitamins 1 tab by mouth daily. 8. B12 1 tab by mouth daily. 9. Vitamin D3 over-the-counter 1 tab by mouth daily. 10.Scripts written for baclofen 10 mg tab, 1 tab by mouth 3 times     daily, dispensed #100, refills none.  DISPOSITION:  The patient will be discharged home with home health for physical therapy, occupational therapy, and speech therapy.  The orders have been written in the chart.  In addition, the patient has a followup appointment with Dr. Tinnie Gens at [facility] on October 29 at 2012.  The patient acknowledges this and family also acknowledges this.     Felicie Morn, PA-C   ______________________________ Thana Farr, MD    DS/MEDQ  D:  10/30/2010  T:  10/30/2010  Job:  [id number]  cc:   Thana Farr, MD  Electronically Signed by Felicie Morn PA-C on 11/27/2010 10:39:41 AM Electronically Signed by Thana Farr MD on 11/27/2010 10:41:29 AM

## 2010-11-30 ENCOUNTER — Emergency Department (HOSPITAL_COMMUNITY)
Admission: EM | Admit: 2010-11-30 | Discharge: 2010-12-01 | Disposition: A | Discharge status: Home / Self Care | Payer: Medicare HMO | Attending: Emergency Medicine | Admitting: Emergency Medicine

## 2010-11-30 ENCOUNTER — Encounter: Payer: Self-pay | Admitting: Emergency Medicine

## 2010-11-30 DIAGNOSIS — T8389XA Other specified complication of genitourinary prosthetic devices, implants and grafts, initial encounter: Secondary | ICD-10-CM | POA: Insufficient documentation

## 2010-11-30 DIAGNOSIS — R42 Dizziness and giddiness: Secondary | ICD-10-CM | POA: Insufficient documentation

## 2010-11-30 DIAGNOSIS — G35 Multiple sclerosis: Secondary | ICD-10-CM | POA: Insufficient documentation

## 2010-11-30 DIAGNOSIS — T83011A Breakdown (mechanical) of indwelling urethral catheter, initial encounter: Secondary | ICD-10-CM

## 2010-11-30 DIAGNOSIS — Z79899 Other long term (current) drug therapy: Secondary | ICD-10-CM | POA: Insufficient documentation

## 2010-11-30 DIAGNOSIS — Y849 Medical procedure, unspecified as the cause of abnormal reaction of the patient, or of later complication, without mention of misadventure at the time of the procedure: Secondary | ICD-10-CM | POA: Insufficient documentation

## 2010-11-30 DIAGNOSIS — R339 Retention of urine, unspecified: Secondary | ICD-10-CM | POA: Insufficient documentation

## 2010-11-30 DIAGNOSIS — R109 Unspecified abdominal pain: Secondary | ICD-10-CM | POA: Insufficient documentation

## 2010-11-30 HISTORY — DX: Unspecified asthma, uncomplicated: J45.909

## 2010-11-30 HISTORY — DX: Multiple sclerosis: G35

## 2010-11-30 NOTE — ED Provider Notes (Signed)
History     CSN: [id number] Arrival date & time: 11/30/2010  9:45 PM   First MD Initiated Contact with Patient 11/30/10 2213      Chief Complaint  Patient presents with  . Urinary Retention    (Consider location/radiation/quality/duration/timing/severity/associated sxs/prior treatment) HPI Comments: Patient with history of multiple sclerosis who lost the ability to urinate approximately 2 months ago after a severe flare -- presents with urinary retention. Patient has had a Foley catheter in place and has had problems with it becoming obstructed in the past 2 months. He thinks it is obstructed today. Patient is just about to finish course of Cipro that he has been on for 10 days. The current Foley catheter has been in place for approximately one week.  The history is provided by the patient and a caregiver.    No past medical history on file.  No past surgical history on file.  No family history on file.  History  Substance Use Topics  . Smoking status: Not on file  . Smokeless tobacco: Not on file  . Alcohol Use: Not on file      Review of Systems  Constitutional: Negative for fever and chills.  HENT: Negative for sore throat and rhinorrhea.   Eyes: Negative for redness.  Respiratory: Negative for shortness of breath.   Cardiovascular: Negative for chest pain.  Gastrointestinal: Positive for abdominal pain. Negative for nausea, vomiting, diarrhea and constipation.  Genitourinary: Positive for difficulty urinating. Negative for hematuria and flank pain.  Musculoskeletal: Negative for myalgias.  Skin: Negative for rash.  Neurological: Positive for dizziness. Negative for headaches.    Allergies  Review of patient's allergies indicates no known allergies.  Home Medications   Current Outpatient Rx  Name Route Sig Dispense Refill  . BACLOFEN 10 MG PO TABS Oral Take 10 mg by mouth 3 (three) times daily.      Marland Kitchen CIPROFLOXACIN HCL 500 MG PO TABS Oral Take 500 mg by  mouth 2 (two) times daily.      Marland Kitchen FLUOXETINE HCL 20 MG PO CAPS Oral Take 20 mg by mouth daily.        BP 115/75  Pulse 99  Temp(Src) 98.2 F (36.8 C) (Oral)  Resp 16  SpO2 97%  Physical Exam  Nursing note and vitals reviewed. Constitutional: He is oriented to person, place, and time. He appears well-developed and well-nourished.  HENT:  Head: Normocephalic and atraumatic.  Eyes: Conjunctivae are normal. Pupils are equal, round, and reactive to light. Right eye exhibits no discharge. Left eye exhibits no discharge.  Neck: Normal range of motion. Neck supple.  Cardiovascular: Normal rate, regular rhythm and normal heart sounds.   Pulmonary/Chest: Effort normal and breath sounds normal.  Abdominal: Soft. Bowel sounds are normal. There is no tenderness. There is no rebound and no guarding.  Musculoskeletal: He exhibits no edema.  Neurological: He is alert and oriented to person, place, and time.  Skin: Skin is warm and dry.  Psychiatric: He has a normal mood and affect.    ED Course  Procedures (including critical care time)  Labs Reviewed  URINALYSIS, ROUTINE W REFLEX MICROSCOPIC - Abnormal; Notable for the following:    Color, Urine RED (*) BIOCHEMICALS MAY BE AFFECTED BY COLOR   Appearance CLOUDY (*)    Hgb urine dipstick LARGE (*)    Ketones, ur TRACE (*)    Protein, ur 100 (*)    Leukocytes, UA SMALL (*)    All other components within normal  limits  URINE MICROSCOPIC-ADD ON  URINE CULTURE   No results found.   1. Urinary retention   2. Malfunction of indwelling urinary catheter     10:41 PM Patient seen and examined. The nurse had flushed the Foley prior to my exam and urine began flowing freely.  He feels much better. Urine sent.  1:02 AM patient and caregiver informed of UA results. Patient continues to feel well. Will discharge to home. Patient is working on getting appointment set up with urologist. He has plans to see his neurologist in the next week. Patient  is stable for discharge home  MDM  Patient with inability to urinate and this multiple sclerosis. His Foley block was locked earlier and obstruction was cleared with flushing. Symptoms fully resolved. UA is not suggestive of urinary tract infection however urine culture sent. Patient is currently on a course of Cipro.       Carolee Rota, [place] 12/01/10 0110

## 2010-11-30 NOTE — ED Notes (Signed)
EAV:[id number]> Expected date:<BR> Expected time:<BR> Means of arrival:<BR> Comments:<BR> Ems/urinary retention

## 2010-11-30 NOTE — ED Notes (Signed)
Per EMS: Pt with MS reports recent UTI and onset of urinary retention approximately 5 hours ago. Pt is currently on Fluoxetine and Ciprofloxacin and Baclofen.

## 2010-12-01 LAB — URINALYSIS, ROUTINE W REFLEX MICROSCOPIC
Bilirubin Urine: NEGATIVE
Protein, ur: 100 mg/dL — AB
Specific Gravity, Urine: 1.023 (ref 1.005–1.030)
Urobilinogen, UA: 0.2 mg/dL (ref 0.0–1.0)

## 2010-12-01 NOTE — ED Notes (Signed)
[facility] called for transport.  

## 2010-12-02 LAB — URINE CULTURE
Colony Count: NO GROWTH
Culture: NO GROWTH

## 2010-12-02 NOTE — ED Provider Notes (Signed)
Medical screening examination/treatment/procedure(s) were performed by non-physician practitioner and as supervising physician I was immediately available for consultation/collaboration.   Luie Laneve A. Patrica Duel, MD 12/02/10 [id number]

## 2010-12-26 ENCOUNTER — Inpatient Hospital Stay (HOSPITAL_COMMUNITY)
Admission: EM | Admit: 2010-12-26 | Discharge: 2011-01-04 | DRG: 058 | Disposition: A | Discharge status: Skilled Nursing Facility | Payer: Medicare PPO | Attending: Internal Medicine | Admitting: Internal Medicine

## 2010-12-26 ENCOUNTER — Encounter (HOSPITAL_COMMUNITY): Payer: Self-pay

## 2010-12-26 DIAGNOSIS — G35D Multiple sclerosis, unspecified: Secondary | ICD-10-CM

## 2010-12-26 DIAGNOSIS — N390 Urinary tract infection, site not specified: Secondary | ICD-10-CM

## 2010-12-26 DIAGNOSIS — G8250 Quadriplegia, unspecified: Secondary | ICD-10-CM | POA: Diagnosis present

## 2010-12-26 DIAGNOSIS — J45909 Unspecified asthma, uncomplicated: Secondary | ICD-10-CM | POA: Insufficient documentation

## 2010-12-26 DIAGNOSIS — R531 Weakness: Secondary | ICD-10-CM

## 2010-12-26 DIAGNOSIS — G35 Multiple sclerosis: Principal | ICD-10-CM | POA: Diagnosis present

## 2010-12-26 DIAGNOSIS — K5900 Constipation, unspecified: Secondary | ICD-10-CM | POA: Diagnosis present

## 2010-12-26 LAB — URINALYSIS, ROUTINE W REFLEX MICROSCOPIC
Ketones, ur: 15 mg/dL — AB
Protein, ur: >300 mg/dL — AB
Specific Gravity, Urine: 1.033 — ABNORMAL HIGH (ref 1.005–1.030)

## 2010-12-26 LAB — URINE MICROSCOPIC-ADD ON

## 2010-12-26 LAB — DIFFERENTIAL
Basophils Relative: 1 % (ref 0–1)
Eosinophils Relative: 2 % (ref 0–5)
Lymphocytes Relative: 28 % (ref 12–46)
Monocytes Absolute: 0.7 10*3/uL (ref 0.1–1.0)
Monocytes Relative: 10 % (ref 3–12)
Neutro Abs: 4.5 10*3/uL (ref 1.7–7.7)

## 2010-12-26 LAB — BASIC METABOLIC PANEL
BUN: 13 mg/dL (ref 6–23)
CO2: 25 mEq/L (ref 19–32)
Calcium: 9.7 mg/dL (ref 8.4–10.5)
Chloride: 106 mEq/L (ref 96–112)
Creatinine, Ser: 0.93 mg/dL (ref 0.50–1.35)

## 2010-12-26 LAB — CBC
HCT: 42 % (ref 39.0–52.0)
Hemoglobin: 14.7 g/dL (ref 13.0–17.0)
MCHC: 35 g/dL (ref 30.0–36.0)
MCV: 84.2 fL (ref 78.0–100.0)

## 2010-12-26 MED ORDER — BISACODYL 5 MG PO TBEC: 10.0000 mg | DELAYED_RELEASE_TABLET | Freq: Every day | ORAL | Status: DC | PRN

## 2010-12-26 MED ORDER — ENOXAPARIN SODIUM 40 MG/0.4ML ~~LOC~~ SOLN
40.0000 mg | SUBCUTANEOUS | Status: DC
  Administered 2010-12-26 – 2011-01-03 (×9): 40 mg via SUBCUTANEOUS
  Filled 2010-12-26 (×10): qty 0.4

## 2010-12-26 MED ORDER — FLUOXETINE HCL 20 MG PO CAPS
20.0000 mg | ORAL_CAPSULE | Freq: Every day | ORAL | Status: DC
  Administered 2010-12-26 – 2011-01-04 (×10): 20 mg via ORAL
  Filled 2010-12-26 (×10): qty 1

## 2010-12-26 MED ORDER — SODIUM CHLORIDE 0.9 % IV SOLN
INTRAVENOUS | Status: DC
  Administered 2010-12-26 – 2010-12-31 (×7): via INTRAVENOUS

## 2010-12-26 MED ORDER — DEXTROSE 5 % IV SOLN
1.0000 g | INTRAVENOUS | Status: DC
  Administered 2010-12-26 – 2010-12-27 (×2): 1 g via INTRAVENOUS
  Filled 2010-12-26 (×2): qty 10

## 2010-12-26 MED ORDER — INTERFERON BETA-1B 0.3 MG ~~LOC~~ SOLR: 1.0000 mg | SUBCUTANEOUS | Status: DC

## 2010-12-26 MED ORDER — OMEGA-3 FATTY ACIDS 1000 MG PO CAPS
1200.0000 mg | ORAL_CAPSULE | Freq: Every day | ORAL | Status: DC
  Administered 2010-12-26 – 2010-12-29 (×4): 1000 mg via ORAL
  Filled 2010-12-26 (×4): qty 1

## 2010-12-26 MED ORDER — SENNOSIDES-DOCUSATE SODIUM 8.6-50 MG PO TABS
2.0000 | ORAL_TABLET | Freq: Every day | ORAL | Status: DC
  Administered 2010-12-26 – 2011-01-03 (×9): 2 via ORAL
  Filled 2010-12-26 (×10): qty 2

## 2010-12-26 MED ORDER — GARLIC 1000 MG PO CAPS: 1000.0000 mg | ORAL_CAPSULE | Freq: Every day | ORAL | Status: DC

## 2010-12-26 MED ORDER — METHYLPREDNISOLONE SODIUM SUCC 1000 MG IJ SOLR
1000.0000 mg | Freq: Every day | INTRAMUSCULAR | Status: DC
  Administered 2010-12-26: 1000 mg via INTRAVENOUS
  Filled 2010-12-26: qty 8

## 2010-12-26 MED ORDER — DEXTROSE 5 % IV SOLN
1.0000 g | Freq: Once | INTRAVENOUS | Status: AC
  Administered 2010-12-26: 1 g via INTRAVENOUS
  Filled 2010-12-26: qty 10

## 2010-12-26 MED ORDER — BACLOFEN 10 MG PO TABS
10.0000 mg | ORAL_TABLET | Freq: Three times a day (TID) | ORAL | Status: DC
  Administered 2010-12-26 – 2011-01-04 (×26): 10 mg via ORAL
  Filled 2010-12-26 (×29): qty 1

## 2010-12-26 MED ORDER — VITAMIN B-12 1000 MCG PO TABS
2000.0000 ug | ORAL_TABLET | Freq: Every day | ORAL | Status: DC
  Administered 2010-12-26 – 2011-01-04 (×10): 2000 ug via ORAL
  Filled 2010-12-26 (×10): qty 2

## 2010-12-26 MED ORDER — VITAMIN D3 25 MCG (1000 UNIT) PO TABS
1000.0000 [IU] | ORAL_TABLET | Freq: Every day | ORAL | Status: DC
  Administered 2010-12-26 – 2011-01-04 (×10): 1000 [IU] via ORAL
  Filled 2010-12-26 (×10): qty 1

## 2010-12-26 MED ORDER — METHYLPREDNISOLONE SODIUM SUCC 125 MG IJ SOLR
125.0000 mg | Freq: Once | INTRAMUSCULAR | Status: DC
  Filled 2010-12-26: qty 2

## 2010-12-26 MED ORDER — SODIUM CHLORIDE 0.9 % IV SOLN: 1000.0000 mg | Freq: Every day | INTRAVENOUS | Status: DC

## 2010-12-26 NOTE — Progress Notes (Signed)
PHARMACIST - PHYSICIAN ORDER COMMUNICATION  CONCERNING: P&T Medication Policy on Herbal Medications  DESCRIPTION:  This patient's order for:  garlic  has been noted.  This product(s) is classified as an "herbal" or natural product. Due to a lack of definitive safety studies or FDA approval, nonstandard manufacturing practices, plus the potential risk of unknown drug-drug interactions while on inpatient medications, the Pharmacy and Therapeutics Committee does not permit the use of "herbal" or natural products of this type within [place].   ACTION TAKEN: The pharmacy department is unable to verify this order at this time and your patient has been informed of this safety policy. Please reevaluate patient's clinical condition at discharge and address if the herbal or natural product(s) should be resumed at that time.   

## 2010-12-26 NOTE — ED Notes (Signed)
5504-02 READY 

## 2010-12-26 NOTE — ED Provider Notes (Signed)
History    this is a 35 year old gentleman with a history of multiple sclerosis is presenting to the ED with chief complaints of weakness. Patient is bed bound. For the past 2 months patient has had multiple recurrent urinary tract infection. He has decrease in appetite in the same duration. He has been having increased lethargy, and general weakness, blurry vision, and change in speech for the past 4 days. His wife was contacted and spoke with his neurologist, who request for patient to be admitted to the hospital for IV prednisone. Patient is currently denying of any pain. He denies headache, chest pain, shortness of breath, abdominal pain, back pain, rash.  CSN: [id number] Arrival date & time: 12/26/2010  1:13 PM   First MD Initiated Contact with Patient 12/26/10 1327      Chief Complaint  Patient presents with  . Weakness    (Consider location/radiation/quality/duration/timing/severity/associated sxs/prior treatment) HPI  Past Medical History  Diagnosis Date  . MS (multiple sclerosis)   . UTI (urinary tract infection)   . Childhood asthma     No past surgical history on file.  Family History  Problem Relation Age of Onset  . Asthma Mother     History  Substance Use Topics  . Smoking status: Former Games developer  . Smokeless tobacco: Former Neurosurgeon  . Alcohol Use: No      Review of Systems  All other systems reviewed and are negative.    Allergies  Review of patient's allergies indicates no known allergies.  Home Medications   Current Outpatient Rx  Name Route Sig Dispense Refill  . INTERFERON BETA-1B 0.3 MG [place] SOLR Subcutaneous Inject 0.25 mg into the skin every other day.      Marland Kitchen BACLOFEN 10 MG PO TABS Oral Take 10 mg by mouth 3 (three) times daily.      Marland Kitchen FLUOXETINE HCL 20 MG PO CAPS Oral Take 20 mg by mouth daily.        There were no vitals taken for this visit.  Physical Exam  Nursing note and vitals reviewed. Constitutional: He appears well-developed and  well-nourished.       Awake, alert, nontoxic appearance. Slow speech, slow movements  HENT:  Head: Normocephalic and atraumatic.  Eyes: Conjunctivae and EOM are normal. Pupils are equal, round, and reactive to light. Right eye exhibits no discharge. Left eye exhibits no discharge.  Neck: Neck supple.  Cardiovascular: Normal rate and regular rhythm.   Pulmonary/Chest: Effort normal and breath sounds normal. He has no wheezes. He exhibits no tenderness.  Abdominal: Soft. Bowel sounds are normal. There is no tenderness. There is no rebound.  Musculoskeletal: He exhibits no tenderness.       Baseline ROM, no obvious new focal weakness  Neurological: He is alert.       Mental status and motor strength appears baseline for patient and situation.  Underlying MS, with bilateral extremities weakness, at his baseline  Skin: No rash noted.  Psychiatric: He has a normal mood and affect.    ED Course  Procedures (including critical care time)  Labs Reviewed - No data to display No results found.   No diagnosis found.    MDM  Pt w/ hx of MS and persistent UTI presents with increase weakness.  UA shows evidence of UTI.  Rocephin given.  IV fluid given.  Will call for admission.     4:32 PM Discuss pt with Orson Eva, who agrees to monitor and call for admission.  Pt require admission  for MS exacerbation.  Will require IV prednisone.     Fayrene Helper, PA 12/26/10 1632  Fayrene Helper, PA 12/26/10 [phone]

## 2010-12-26 NOTE — H&P (Signed)
Hospital Admission Note Date: 12/26/2010  Patient name: Terry Palmer Medical record number: [id number] Date of birth: 26-Feb-1975 Age: 35 y.o. Gender: male PCP: Dorrene German, MD Outpatient Neurologist: Dr. Stephanie Acre  Medical Service: Internal Medicine Teaching Service  Attending physician:  Tilford Pillar    1st Contact: Margorie John  Pager: [phone] 2nd Contact: Johnette Abraham  Pager: [phone] After 5 pm or weekends: 1st Contact:      Pager: [phone] 2nd Contact:      Pager: [phone]  Chief Complaint: Weakness  History of Present Illness: The patient is a 35 yo man with a long-standing history of MS x20 yrs, presenting with weakness.  The patient notes a 1-week history of generalized weakness, as well as increased weakness in his hands and feet, interfering with completion of his daily tasks.  He also notes decreased appetite during this time period, and notes that he did not eat anything on the day prior to admission.  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  The patient has also had several prior UTI's over the last 2 months, treated with cipro 500 mg BID x7 days on 10/24, cipro 500 mg BID x10 days around 11/12 (approx), and a different antibiotic x4 days 1 week ago at [facility]  He currently notes no dysuria, suprapubic pain, or CVA tenderness, but notes some decreased UOP x5 days, with darkening of his urine from his foley catheter.  The patient also notes some constipation recently, with his last bowel movement 2-3 days ago.  The patient notes no cp, sob, cough, fevers, chills, abd pain, nausea, or vomiting.  Meds: Medications Prior to Admission  Medication Sig Dispense Refill  . baclofen (LIORESAL) 10 MG tablet Take 10 mg by mouth 3 (three) times daily.       Marland Kitchen FLUoxetine (PROZAC) 20 MG capsule Take 20 mg by  mouth daily.       Betaseron (interferon beta 1b) 0.3 mg sq injection every other day  Allergies: Review of patient's allergies indicates no known allergies. Past Medical History  Diagnosis Date  . MS (multiple sclerosis)   . UTI (urinary tract infection) - history of multiple UTI's since placement of foley catheter during MS flare in August 2012, which caused significant urinary retention.  He has had no trial off of the foley since that time.  His foley was last changed 2 weeks ago.   . Childhood asthma - currently no inhaler use for many years    History reviewed. No pertinent past surgical history.  Family History  Problem Relation Age of Onset  . Asthma Mother    History   Social History  . Marital Status: Married    Spouse Name: N/A    Number of Children: N/A  . Years of Education: N/A   Occupational History  . Not on file.   Social History Main Topics  . Smoking status: Denies  . Smokeless tobacco: Former Neurosurgeon  . Alcohol Use: No  . Drug Use: Yes    Special: Marijuana, Cocaine     Former use  . Sexually Active:    Social History Narrative  . The patient currently lives at home with his wife and kids, who help him with his ADL's.  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  The patient  currently has [facility], and is in the process of reapplying for Medicaid (12/2010).   Review of Systems: General: no fevers, chills, changes in weight, changes in appetite Skin: no rash HEENT: no blurry vision, hearing changes, sore throat Pulm: no dyspnea, coughing, wheezing CV: no chest pain, palpitations, shortness of breath Abd: no abdominal pain, nausea/vomiting, diarrhea GU: see HPI Ext: no arthralgias, myalgias Neuro: see HPI  Physical Exam: Blood pressure 118/86, pulse 80, temperature 97.2 F (36.2 C), temperature source Oral, resp. rate 18, SpO2 98.00%. General: alert, cooperative, lying  in bed, and in no apparent distress HEENT: pupils equal round and reactive to light, vision grossly intact, oropharynx clear and non-erythematous  Neck: supple, no lymphadenopathy, no JVD Lungs: clear to ascultation bilaterally, normal work of respiration, no wheezes, rales, ronchi Heart: regular rate and rhythm, no murmurs, gallops, or rubs Abdomen: soft, non-tender, non-distended, normal bowel sounds, no CVA tenderness, no suprapubic tenderness Msk: no joint edema, warmth, or erythema Extremities: no cyanosis, clubbing, or edema Neurologic: alert & oriented X3, speech with some dysarthria and word-finding difficulty (baseline), mild bifacial weakness, tongue midline.  Spastic quadriparesis, with b/l UE strength 3/5 with noticeable muscle wasting.  B/l LE show spasticity and increased tone.  DTR's 3+ throughout, babinski upgoing bilaterally.  Sensation grossly intact.  Lab results: Basic Metabolic Panel:  Basename 12/26/10 1400  NA 141  K 3.9  CL 106  CO2 25  GLUCOSE 101*  BUN 13  CREATININE 0.93  CALCIUM 9.7  MG --  PHOS --   CBC:  Basename 12/26/10 1400  WBC 7.4  NEUTROABS 4.5  HGB 14.7  HCT 42.0  MCV 84.2  PLT 285   UA Color: red Appearance: turbid Spec grav: 1.033 pH: 5.5 Glucose: neg Bili: small Ketones: 15 Protein: > 300 Urobilinogen: 0.2 Nitrite: neg Leukocytes: large WBC's: too numerous to count RBC's: too numerous to count Squam: rare Bacteria: few Crystals: calcium oxalate  Assessment & Plan by Problem: The patient is a 35 yo man with a long-standing history of MS x20 yrs, presenting with weakness consistent with MS flare, and UA results suggestive of possible UTI.  1. Acute on Chronic Multiple Sclerosis - patient has a long-standing history of MS, presenting with symptoms of weakness and decreased appetite, likely consistent with MS flare.  [name] most recent MRI of the brain and cervical spine 10/20/10 showed no new lesions, though the c  spine imaging was of poor quality.  Repeat MRI is likely not needed at this point -appreciate neuro recs -IV solu-medrol -NS at 125 cc/hr  2. Decreased Urinary output - patient presents with decreased UOP, darkening of urine, and LE & WBC's seen on UA, though with no dysuria, suprapubic pain, fevers, or CVA tenderness, in the setting of a chronic foley catheter.  The patient does not meet the CDC or IDSA criteria for UTI (dysuria, suprapubic pain, fevers, or CVA tenderness), and his symptoms more likely represent asymptomatic bacteriuria.  However, his symptoms of decreased UOP, discoloration of urine, and recent antibiotic use complicate the picture, making relapsed UTI a possibility.  The patient has had 2 rounds of cipro (7 days starting 10/24, 10 days starting approx 11/12) and 1 round of an unknown antibiotic (4 days, starting 1 week ago at [facility]) recently, and was given ceftriaxone in the ED.  His most recent positive urine culture 10/31/10 showed E Coli and Proteus, which was pan-sensitive.  Culture 11/23 was negative.  Foley catheter was placed in August 2012  during an acute MS flare for urinary retention, with no trial off foley since that time. -continue ceftriaxone for now, though may consider discontinuing antibiotics in a.m. -urine culture sent, f/u results -the patient was placed on foley catheter in August during an acute MS flare.  Patient could likely try a trial off of foley, though it may be more appropriate in the outpatient setting than during an acute MS exacerbation.  Will touch base with neurology in a.m. -will replace foley in a.m. if we do not try a foley decath trial  3. Constipation - the patient has a history of chronic constipation, with currently no bowel movement in the last 2-3 days, with no abdominal pain.  The patient is on home dulcolax. -ordered sennakot S -consider miralax tomorrow if no BM  4. Prophy - Lovenox   Signed: Janalyn Harder 12/26/2010,  8:09 PM

## 2010-12-26 NOTE — Consult Note (Signed)
Reason for Consult: Multiple sclerosis worsening Referring Physician:Kevin Denton Lank, MD     HPI:  Terry Palmer is a 35 year old male with long-standing history of multiple sclerosis for the last 20 years. He has severe advanced disease and has quadriparesis and is bedbound. He's had multiple hospital admissions for exacerbation of his symptoms with the last admission being 2 months ago. He was treated at that time with IV Solu-Medrol six-day course and was found to have urinary tract infection. He lives at home with his wife and children. 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 He is presently on Betaseron. He complains of generalized weakness for the last 4 days with decreased appetite and not eating since yesterday. He complains of some dysuria and he wears a diaper at baseline. She denies any fever cough or cold. She denies significant vertigo, diplopia, blurred vision or focal increased weakness. His admission labs show a normal chemistry but too numerous  to count WBCs on his UA suggestive of persistent infection.  Past Medical History  Diagnosis Date  . MS (multiple sclerosis)   . UTI (urinary tract infection)   . Childhood asthma     History reviewed. No pertinent past surgical history.  Family History  Problem Relation Age of Onset  . Asthma Mother     Social History:  reports that he has quit smoking. He has quit using smokeless tobacco. He reports that he uses illicit drugs (Marijuana and Cocaine). He reports that he does not drink alcohol.  Allergies: No Known Allergies  Medications: I have reviewed the patient's current medications.  Results for orders placed during the hospital encounter of 12/26/10 (from the past 48 hour(s))  CBC     Status: Normal   Collection Time   12/26/10  2:00 PM      Component  Value Range Comment   WBC 7.4  4.0 - 10.5 (K/uL)    RBC 4.99  4.22 - 5.81 (MIL/uL)    Hemoglobin 14.7  13.0 - 17.0 (g/dL)    HCT 96.0  45.4 - 09.8 (%)    MCV 84.2  78.0 - 100.0 (fL)    MCH 29.5  26.0 - 34.0 (pg)    MCHC 35.0  30.0 - 36.0 (g/dL)    RDW 11.9  14.7 - 82.9 (%)    Platelets 285  150 - 400 (K/uL)   DIFFERENTIAL     Status: Normal   Collection Time   12/26/10  2:00 PM      Component Value Range Comment   Neutrophils Relative 60  43 - 77 (%)    Neutro Abs 4.5  1.7 - 7.7 (K/uL)    Lymphocytes Relative 28  12 - 46 (%)    Lymphs Abs 2.0  0.7 - 4.0 (K/uL)    Monocytes Relative 10  3 - 12 (%)    Monocytes Absolute 0.7  0.1 - 1.0 (K/uL)    Eosinophils Relative 2  0 - 5 (%)    Eosinophils Absolute 0.2  0.0 - 0.7 (K/uL)    Basophils Relative 1  0 - 1 (%)    Basophils Absolute 0.0  0.0 - 0.1 (K/uL)   BASIC METABOLIC PANEL     Status: Abnormal   Collection Time   12/26/10  2:00 PM      Component Value Range  Comment   Sodium 141  135 - 145 (mEq/L)    Potassium 3.9  3.5 - 5.1 (mEq/L)    Chloride 106  96 - 112 (mEq/L)    CO2 25  19 - 32 (mEq/L)    Glucose, Bld 101 (*) 70 - 99 (mg/dL)    BUN 13  6 - 23 (mg/dL)    Creatinine, Ser 1.61  0.50 - 1.35 (mg/dL)    Calcium 9.7  8.4 - 10.5 (mg/dL)    GFR calc non Af Amer >90  >90 (mL/min)    GFR calc Af Amer >90  >90 (mL/min)   URINALYSIS, ROUTINE W REFLEX MICROSCOPIC     Status: Abnormal   Collection Time   12/26/10  2:16 PM      Component Value Range Comment   Color, Urine RED (*) YELLOW  BIOCHEMICALS MAY BE AFFECTED BY COLOR   APPearance TURBID (*) CLEAR     Specific Gravity, Urine 1.033 (*) 1.005 - 1.030     pH 5.5  5.0 - 8.0     Glucose, UA NEGATIVE  NEGATIVE (mg/dL)    Hgb urine dipstick LARGE (*) NEGATIVE     Bilirubin Urine SMALL (*) NEGATIVE     Ketones, ur 15 (*) NEGATIVE (mg/dL)    Protein, ur >096 (*) NEGATIVE (mg/dL)    Urobilinogen, UA 0.2  0.0 - 1.0 (mg/dL)    Nitrite NEGATIVE  NEGATIVE     Leukocytes, UA LARGE  (*) NEGATIVE    URINE MICROSCOPIC-ADD ON     Status: Abnormal   Collection Time   12/26/10  2:16 PM      Component Value Range Comment   Squamous Epithelial / LPF RARE  RARE     WBC, UA TOO NUMEROUS TO COUNT  <3 (WBC/hpf)    RBC / HPF TOO NUMEROUS TO COUNT  <3 (RBC/hpf)    Bacteria, UA FEW (*) RARE     Crystals CA OXALATE CRYSTALS (*) NEGATIVE     Urine-Other MUCOUS PRESENT       No results found.     Review of Systems : as documented in history of present illness Blood pressure 110/73, pulse 76, temperature 97.2 F (36.2 C), temperature source Oral, resp. rate 18, SpO2 99.00%.   Physical Exam : frail cachectic young African American male who is not in distress. Afebrile. Distal pulses are felt. Head is nontraumatic without bruit. Spastic contractures of lower extremities with scissoring at the knees and hips Neurological Exam ; awake alert oriented to time place and person follow simple commands well. Speech is slow with scanning and dysarthria. Occasional word finding difficulties with disfluent speech. Eye movements are full range with saccadic dysmetria. Mild nystagmus on horizontal and vertical gaze. No ophthalmoplegia. Blinks to threat bilaterally. Mild bifacial weakness. Tongue is midline.  Motor system exam reveals spastic quadriparesis with 3/5 strength in the upper extremities at the shoulders and elbows with significant weakness of both grips and intrinsic hand muscles with muscle wasting. Lower extremities show significant spasticity with increased tone and bilateral ankle clonus with mild stimulation with scissoring at the hips and knees with the legs crossed. Deep tendon offenses all exaggerated. Plantars are both upgoing. Touch and pinprick sensation appear preserved.  There is bilateral finger-to-nose dysmetria and knee to heel coordination cannot be tested. Gait was not tested. Assessment/Plan:  35 year old male who with long-standing severe advanced end-stage multiple  sclerosis with subjective worsening of weakness with decreased appetite and poor oral intake in the setting  of urinary tract infection which appears to be chronic persistent and ongoing. This may represent worsening of his MS and treatment with a short course of high-dose steroids may be beneficial.  Plan : The patient will be admitted to the medical hospital;ist service and appropriate treatment of urinary tract infection with antibiotics. IV Solu-Medrol 1 g daily for 3 days. Maintain IV hydration with normal saline. Send urine culture sensitivity. Check chest x-ray. I had a long discussion with the patient regarding the symptoms plan for evaluation treatment and answered questions. Neurohospitalist team will follow the patient in a.m. Kindly call for questions.  SETHI,PRAMODKUMAR P 12/26/2010, 5:31 PM

## 2010-12-26 NOTE — ED Notes (Signed)
Pt eating meal tray at this time; wife assisting pt; 2 cokes with a cup of ice given to pt

## 2010-12-26 NOTE — ED Notes (Signed)
Provider at bedside

## 2010-12-26 NOTE — ED Notes (Signed)
Pt undressed, in gown, on monitor, continuous pulse oximetry and blood pressure cuff; wife at bedside 

## 2010-12-26 NOTE — ED Notes (Signed)
Per ems- pt c/o lethargy and gen weakness x4 days. Pt had 3 UTIS over past 2 mo. Pt oriented and responsive and follows commands to baseline.

## 2010-12-26 NOTE — ED Provider Notes (Signed)
Dr. Pearlean Brownie has seen the patient and written neuro consult. Recommends internal medicine admission secondary to UTI.  Rodena Medin, PA 12/26/10 1746    Medical screening examination/treatment/procedure(s) were conducted as a shared visit with non-physician practitioner(s) and myself.  I personally evaluated the patient during the encounter Pt hx ms w gen weakness, worsening. Neuro consult. rec high dose steroids, admit, rx uti.   Suzi Roots, MD 12/26/10 [phone]

## 2010-12-27 ENCOUNTER — Inpatient Hospital Stay (HOSPITAL_COMMUNITY): Payer: Medicare PPO

## 2010-12-27 ENCOUNTER — Encounter (HOSPITAL_COMMUNITY): Payer: Self-pay

## 2010-12-27 DIAGNOSIS — G35 Multiple sclerosis: Principal | ICD-10-CM

## 2010-12-27 LAB — COMPREHENSIVE METABOLIC PANEL
AST: 25 U/L (ref 0–37)
CO2: 21 mEq/L (ref 19–32)
Calcium: 9.2 mg/dL (ref 8.4–10.5)
Creatinine, Ser: 0.93 mg/dL (ref 0.50–1.35)
GFR calc Af Amer: >90 mL/min (ref >90)
GFR calc non Af Amer: >90 mL/min (ref >90)
Glucose, Bld: 127 mg/dL — ABNORMAL HIGH (ref 70–99)

## 2010-12-27 MED ORDER — SODIUM CHLORIDE 0.9 % IV SOLN
1000.0000 mg | Freq: Every day | INTRAVENOUS | Status: AC
  Administered 2010-12-27 – 2010-12-29 (×3): 1000 mg via INTRAVENOUS
  Filled 2010-12-27 (×3): qty 8

## 2010-12-27 MED ORDER — INTERFERON BETA-1B 0.3 MG ~~LOC~~ SOLR
0.3000 mg | SUBCUTANEOUS | Status: DC
  Administered 2010-12-27 – 2011-01-02 (×4): 0.3 mg via SUBCUTANEOUS
  Filled 2010-12-27: qty 0.3

## 2010-12-27 MED ORDER — FLEET ENEMA 7-19 GM/118ML RE ENEM
1.0000 | ENEMA | Freq: Every day | RECTAL | Status: DC | PRN
  Administered 2010-12-27: 1 via RECTAL
  Filled 2010-12-27: qty 1

## 2010-12-27 MED ORDER — ENSURE CLINICAL ST REVIGOR PO LIQD
237.0000 mL | Freq: Two times a day (BID) | ORAL | Status: DC
  Administered 2010-12-28 – 2011-01-04 (×15): 237 mL via ORAL

## 2010-12-27 NOTE — H&P (Signed)
Internal Medicine Teaching Service Attending Note Date: 12/27/2010  Patient name: Terry Palmer  Medical record number: [id number]  Date of birth: [birth year]   I have seen and evaluated Terry Palmer and discussed their care with the Residency Team.   Patient was eating breakfast with excellent nursing assistance when I went in room this AM. He is feeling better with HD IV solumedrol. Obviously he has had a rough time since 8/12 when foley was placed.  Physical Exam: Blood pressure 115/76, pulse 86, temperature 97.6 F (36.4 C), temperature source Oral, resp. rate 17, height 6\' 4"  (1.93 m), weight 166 lb 14.4 oz (75.705 kg), SpO2 97.00%. Pleasant male laying in bed, quadraplegic. Lungs-clear Heart-RRR Abdom-+BS, NT Extrem-no edema Neuro-some baseline dysarthria, but very alert and oriented and aware of what is going on Spastic quadriparesis, UE 3+/5 strength with muscle wasting, sensation grossly intact.  Lab results: Results for orders placed during the hospital encounter of 12/26/10 (from the past 24 hour(s))  CBC     Status: Normal   Collection Time   12/26/10  2:00 PM      Component Value Range   WBC 7.4  4.0 - 10.5 (K/uL)   RBC 4.99  4.22 - 5.81 (MIL/uL)   Hemoglobin 14.7  13.0 - 17.0 (g/dL)   HCT 84.6  96.2 - 95.2 (%)   MCV 84.2  78.0 - 100.0 (fL)   MCH 29.5  26.0 - 34.0 (pg)   MCHC 35.0  30.0 - 36.0 (g/dL)   RDW 84.1  32.4 - 40.1 (%)   Platelets 285  150 - 400 (K/uL)  DIFFERENTIAL     Status: Normal   Collection Time   12/26/10  2:00 PM      Component Value Range   Neutrophils Relative 60  43 - 77 (%)   Neutro Abs 4.5  1.7 - 7.7 (K/uL)   Lymphocytes Relative 28  12 - 46 (%)   Lymphs Abs 2.0  0.7 - 4.0 (K/uL)   Monocytes Relative 10  3 - 12 (%)   Monocytes Absolute 0.7  0.1 - 1.0 (K/uL)   Eosinophils Relative 2  0 - 5 (%)   Eosinophils Absolute 0.2  0.0 - 0.7 (K/uL)   Basophils Relative 1  0 - 1 (%)   Basophils Absolute 0.0  0.0 - 0.1 (K/uL)  BASIC  METABOLIC PANEL     Status: Abnormal   Collection Time   12/26/10  2:00 PM      Component Value Range   Sodium 141  135 - 145 (mEq/L)   Potassium 3.9  3.5 - 5.1 (mEq/L)   Chloride 106  96 - 112 (mEq/L)   CO2 25  19 - 32 (mEq/L)   Glucose, Bld 101 (*) 70 - 99 (mg/dL)   BUN 13  6 - 23 (mg/dL)   Creatinine, Ser 0.27  0.50 - 1.35 (mg/dL)   Calcium 9.7  8.4 - 25.3 (mg/dL)   GFR calc non Af Amer >90  >90 (mL/min)   GFR calc Af Amer >90  >90 (mL/min)  URINALYSIS, ROUTINE W REFLEX MICROSCOPIC     Status: Abnormal   Collection Time   12/26/10  2:16 PM      Component Value Range   Color, Urine RED (*) YELLOW    APPearance TURBID (*) CLEAR    Specific Gravity, Urine 1.033 (*) 1.005 - 1.030    pH 5.5  5.0 - 8.0    Glucose, UA NEGATIVE  NEGATIVE (mg/dL)  Hgb urine dipstick LARGE (*) NEGATIVE    Bilirubin Urine SMALL (*) NEGATIVE    Ketones, ur 15 (*) NEGATIVE (mg/dL)   Protein, ur >161 (*) NEGATIVE (mg/dL)   Urobilinogen, UA 0.2  0.0 - 1.0 (mg/dL)   Nitrite NEGATIVE  NEGATIVE    Leukocytes, UA LARGE (*) NEGATIVE   URINE MICROSCOPIC-ADD ON     Status: Abnormal   Collection Time   12/26/10  2:16 PM      Component Value Range   Squamous Epithelial / LPF RARE  RARE    WBC, UA TOO NUMEROUS TO COUNT  <3 (WBC/hpf)   RBC / HPF TOO NUMEROUS TO COUNT  <3 (RBC/hpf)   Bacteria, UA FEW (*) RARE    Crystals CA OXALATE CRYSTALS (*) NEGATIVE    Urine-Other MUCOUS PRESENT    COMPREHENSIVE METABOLIC PANEL     Status: Abnormal   Collection Time   12/26/10 11:34 PM      Component Value Range   Sodium 136  135 - 145 (mEq/L)   Potassium 3.7  3.5 - 5.1 (mEq/L)   Chloride 104  96 - 112 (mEq/L)   CO2 21  19 - 32 (mEq/L)   Glucose, Bld 127 (*) 70 - 99 (mg/dL)   BUN 12  6 - 23 (mg/dL)   Creatinine, Ser 0.96  0.50 - 1.35 (mg/dL)   Calcium 9.2  8.4 - 04.5 (mg/dL)   Total Protein 6.5  6.0 - 8.3 (g/dL)   Albumin 3.5  3.5 - 5.2 (g/dL)   AST 25  0 - 37 (U/L)   ALT 32  0 - 53 (U/L)   Alkaline Phosphatase  97  39 - 117 (U/L)   Total Bilirubin 0.3  0.3 - 1.2 (mg/dL)   GFR calc non Af Amer >90  >90 (mL/min)   GFR calc Af Amer >90  >90 (mL/min)    Imaging results:  No results found.  Assessment and Plan: I agree with the formulated Assessment and Plan with the following changes:  It is difficult to tell if patient has a UTI or not. This may represent relapsing UTI. His last + culture was 10/12 and was proteus and e. Coli and both were pansensitive. He has been on several courses of antibiotics, the last given to him from the ED at [facility]. Once he is out of this MS flare a trial without the foley might be considered.

## 2010-12-27 NOTE — Progress Notes (Signed)
INITIAL ADULT NUTRITION ASSESSMENT Date: 12/27/2010   Time: 3:30 PM  Reason for Assessment: Low Braden Scale  ASSESSMENT: Male 35 y.o.  Dx: Multiple sclerosis exacerbation  Hx:  Past Medical History  Diagnosis Date  . MS (multiple sclerosis)   . UTI (urinary tract infection)   . Childhood asthma     Related Meds:     . baclofen  10 mg Oral TID  . cefTRIAXone (ROCEPHIN)  IV  1 g Intravenous Once  . cefTRIAXone (ROCEPHIN)  IV  1 g Intravenous Q24H  . cholecalciferol  1,000 Units Oral Daily  . enoxaparin  40 mg Subcutaneous Q24H  . fish oil-omega-3 fatty acids  1,000 mg Oral Daily  . FLUoxetine  20 mg Oral Daily  . interferon beta-1b  1 mg Subcutaneous QODAY  . methylPREDNISolone (SOLU-MEDROL) injection  1,000 mg Intravenous Daily  . senna-docusate  2 tablet Oral QHS  . vitamin B-12  2,000 mcg Oral Daily  . DISCONTD: Garlic  1,000 mg Oral Daily  . DISCONTD: methylPREDNISolone (SOLU-MEDROL) injection  1,000 mg Intravenous Daily  . DISCONTD: methylPREDNISolone (SOLU-MEDROL) injection  1,000 mg Intravenous Daily  . DISCONTD: methylPREDNISolone (SOLU-MEDROL) injection  125 mg Intravenous Once     Ht: 6\' 4"  (193 cm)  Wt: 166 lb 14.4 oz (75.705 kg)  Ideal Wt: 91.8 % Ideal Wt: 82%  Usual Wt: unknown % Usual Wt: unknown  Body mass index is 20.32 kg/(m^2).  Food/Nutrition Related Hx: unable to talk with patient and obtain a food and nutrition hx. Per H&P decreased appetite and intake PTA which is consistent with previous MS flares.   Labs:  CMP     Component Value Date/Time   NA 136 12/26/2010 2334   K 3.7 12/26/2010 2334   CL 104 12/26/2010 2334   CO2 21 12/26/2010 2334   GLUCOSE 127* 12/26/2010 2334   BUN 12 12/26/2010 2334   CREATININE 0.93 12/26/2010 2334   CALCIUM 9.2 12/26/2010 2334   PROT 6.5 12/26/2010 2334   ALBUMIN 3.5 12/26/2010 2334   AST 25 12/26/2010 2334   ALT 32 12/26/2010 2334   ALKPHOS 97 12/26/2010 2334   BILITOT 0.3 12/26/2010 2334   GFRNONAA >90 12/26/2010 2334   GFRAA >90 12/26/2010 2334      Intake:  Output:   Diet Order: Dysphagia  Supplements/Tube Feeding:  IVF:    sodium chloride Last Rate: 125 mL/hr at 12/26/10 1405    Estimated Nutritional Needs:   Kcal:2200-2600 Protein:115-150 gm Fluid: 2.2 -2.6 L  Patient has a stage II pressure ulcer on sacrum. Patient was not available to confirm nutrition hx or weight trends. PO intake variable.  NUTRITION DIAGNOSIS: -Increased nutrient needs (NI-5.1).  Status: Ongoing  RELATED TO: stage II pressure ulcer  AS EVIDENCE BY: estimated protein needs  MONITORING/EVALUATION(Goals): Goal: PO intake will meet 90-100% of estimated needs.  Monitor: PO intake, weights, labs, I/O's  EDUCATION NEEDS: -No education needs identified at this time  INTERVENTION: 1. Add Ensure Clinical Strength BID 2. RD to follow for additional protein supplement needs.   Dietitian [phone]  DOCUMENTATION CODES Per approved criteria  -Not Applicable    [name] 12/27/2010, 3:30 PM

## 2010-12-27 NOTE — Progress Notes (Signed)
[facility] PROGRESS NOTE    SUBJECTIVE   Feels stronger today.  Has yet to receive first dose of Solumedrol.  OBJECTIVE   Vital signs in last 24 hours: Temp:  [97.2 F (36.2 C)-98.5 F (36.9 C)] 97.6 F (36.4 C) (12/19 0459) Pulse Rate:  [75-89] 86  (12/18 2156) Resp:  [16-18] 17  (12/19 0459) BP: (106-122)/(73-93) 115/76 mmHg (12/19 0459) SpO2:  [96 %-100 %] 97 % (12/19 0459) Weight:  [75.705 kg (166 lb 14.4 oz)] 166 lb 14.4 oz (75.705 kg) (12/18 2156)  Intake/Output from previous day: 12/18 0701 - 12/19 0700 In: 2156.3 [I.V.:2056.3; IV Piggyback:100] Out: 550 [Urine:550] Intake/Output this shift: Total I/O In: 354 [P.O.:354] Out: -  Nutritional status: Dysphagia  Past Medical History  Diagnosis Date  . MS (multiple sclerosis)   . UTI (urinary tract infection)   . Childhood asthma     Neurologic Exam:   Mental Status: Alert, oriented, thought content appropriate.  Speech dysarthric (baseline) fluent without evidence of aphasia. Able to follow 3 step commands without difficulty. Cranial Nerves: II-Visual fields grossly intact. III/IV/VI-Extraocular movements intact, his eye movements are not smooth (baseline).  Pupils reactive bilaterally. V/VII-Smile symmetric VIII-grossly intact IX/X-normal gag XI-bilateral shoulder shrug XII-midline tongue extension Motor: 5/5 bilaterally UE with normal tone and decreased bulk.  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   Sensory: Pinprick and light touch intact throughout, bilaterally Deep Tendon Reflexes: 2+ and symmetric throughout UE and bilateral LE shows 4+ patella and achilles with clonus Plantars: upgioing bilaterally Cerebellar: Normal finger-to-nose,     Lab Results: Lab Results  Component Value Date/Time   CHOL  Value: 151        ATP III CLASSIFICATION:  <200     mg/dL   Desirable  782-956  mg/dL   Borderline High  >=213    mg/dL   High        0/86/5784   7:06 AM   Lipid Panel No results found for this basename: CHOL,TRIG,HDL,CHOLHDL,VLDL,LDLCALC in the last 72 hours  Studies/Results: No results found.  Medications:     Scheduled:   . baclofen  10 mg Oral TID  . cefTRIAXone (ROCEPHIN)  IV  1 g Intravenous Once  . cefTRIAXone (ROCEPHIN)  IV  1 g Intravenous Q24H  . cholecalciferol  1,000 Units Oral Daily  . enoxaparin  40 mg Subcutaneous Q24H  . fish oil-omega-3 fatty acids  1,000 mg Oral Daily  . FLUoxetine  20 mg Oral Daily  . interferon beta-1b  1 mg Subcutaneous QODAY  . methylPREDNISolone (SOLU-MEDROL) injection  1,000 mg Intravenous Daily  . senna-docusate  2 tablet Oral QHS  . vitamin B-12  2,000 mcg Oral Daily  . DISCONTD: Garlic  1,000 mg Oral Daily  . DISCONTD: methylPREDNISolone (SOLU-MEDROL) injection  1,000 mg Intravenous Daily  . DISCONTD: methylPREDNISolone (SOLU-MEDROL) injection  1,000 mg Intravenous Daily  . DISCONTD: methylPREDNISolone (SOLU-MEDROL) injection  125 mg Intravenous Once    Assessment/Plan:    Patient Active Hospital Problem List: Multiple sclerosis exacerbation (12/26/2010)   Assessment:  Possible exacerbation secondary to UTI    Plan: getting IV solumedrol first dose today of total of 3    Buren Kos [facility] [phone]  12/27/2010, 11:26 AM    Thana Farr, MD [facility] [phone]

## 2010-12-27 NOTE — Progress Notes (Signed)
Subjective: Patient complains of constipation, asked nurse for enema. Denies any pain.  Vital signs in last 24 hours: Filed Vitals:   12/26/10 1915 12/26/10 2156 12/27/10 0459 12/27/10 1356  BP: 118/86 116/81 115/76 119/80  Pulse: 80 86  107  Temp:  98.5 F (36.9 C) 97.6 F (36.4 C) 97.9 F (36.6 C)  TempSrc:  Oral Oral Oral  Resp:  17 17 18   Height:  6\' 4"  (1.93 m)    Weight:  166 lb 14.4 oz (75.705 kg)    SpO2: 98% 96% 97% 98%   Weight change:   Intake/Output Summary (Last 24 hours) at 12/27/10 1503 Last data filed at 12/27/10 1300  Gross per 24 hour  Intake 2732.25 ml  Output   1750 ml  Net 982.25 ml   Physical Exam: General: young man resting in bed, in no acute distress HEENT: PERRL, EOMI, no scleral icterus, jumping eye movements Cardiac: RRR, no rubs, murmurs or gallops Pulm: clear to auscultation bilaterally, moving normal volumes of air Abd: soft, nontender, nondistended, BS present Ext: warm and well perfused, no pedal edema Neuro: alert and oriented X3, cranial nerves II-XII grossly intact  Lab Results: Basic Metabolic Panel:  Lab 12/26/10 1610 12/26/10 1400  NA 136 141  K 3.7 3.9  CL 104 106  CO2 21 25  GLUCOSE 127* 101*  BUN 12 13  CREATININE 0.93 0.93  CALCIUM 9.2 9.7  MG -- --  PHOS -- --   Liver Function Tests:  Lab 12/26/10 2334  AST 25  ALT 32  ALKPHOS 97  BILITOT 0.3  PROT 6.5  ALBUMIN 3.5   CBC:  Lab 12/26/10 1400  WBC 7.4  NEUTROABS 4.5  HGB 14.7  HCT 42.0  MCV 84.2  PLT 285   Micro Results: Recent Results (from the past 240 hour(s))  URINE CULTURE     Status: Normal (Preliminary result)   Collection Time   12/26/10  2:16 PM      Component Value Range Status Comment   Specimen Description URINE, CLEAN CATCH   Final    Special Requests ADDED ON [id number] @1627    Final    Setup Time [id number]   Final    Colony Count >=100,000 COLONIES/ML   Final    Culture GRAM NEGATIVE RODS   Final    Report Status PENDING    Incomplete    Studies/Results: Dg Chest 1 View  12/27/2010  *RADIOLOGY REPORT*  Clinical Data: Pneumonia  CHEST - 1 VIEW  Comparison: Portable chest x-ray of 08/21/2009  Findings: No active infiltrate or effusion is seen.  The heart is within normal limits in size.  No bony abnormality is noted.  IMPRESSION: No active lung disease.  Original Report Authenticated By: Juline Patch, M.D.   Medications: I have reviewed the patient's current medications. Scheduled Meds:   . baclofen  10 mg Oral TID  . cefTRIAXone (ROCEPHIN)  IV  1 g Intravenous Once  . cefTRIAXone (ROCEPHIN)  IV  1 g Intravenous Q24H  . cholecalciferol  1,000 Units Oral Daily  . enoxaparin  40 mg Subcutaneous Q24H  . fish oil-omega-3 fatty acids  1,000 mg Oral Daily  . FLUoxetine  20 mg Oral Daily  . interferon beta-1b  1 mg Subcutaneous QODAY  . methylPREDNISolone (SOLU-MEDROL) injection  1,000 mg Intravenous Daily  . senna-docusate  2 tablet Oral QHS  . vitamin B-12  2,000 mcg Oral Daily  . DISCONTD: Garlic  1,000 mg Oral Daily  . DISCONTD:  methylPREDNISolone (SOLU-MEDROL) injection  1,000 mg Intravenous Daily  . DISCONTD: methylPREDNISolone (SOLU-MEDROL) injection  1,000 mg Intravenous Daily  . DISCONTD: methylPREDNISolone (SOLU-MEDROL) injection  125 mg Intravenous Once   Continuous Infusions:   . sodium chloride 125 mL/hr at 12/26/10 1405   PRN Meds:.bisacodyl, sodium phosphate Assessment/Plan: Assessment & Plan by Problem:  The patient is a 35 yo man with a long-standing history of MS x20 yrs, presenting with weakness consistent with MS flare, and UA results suggestive of possible UTI.   1. Acute on Chronic Multiple Sclerosis - patient has a long-standing history of MS, presenting with symptoms of weakness and decreased appetite, likely consistent with MS flare. [name] most recent MRI of the brain and cervical spine 10/20/10 showed no new lesions, though the c spine imaging was of poor quality. Repeat MRI  is likely not needed at this point  -appreciate neuro recs  -IV solu-medrol  -will slow normal saline to 75cc/hr since patient taking good PO  2. UTI - patient presents with decreased UOP, darkening of urine, and LE & WBC's seen on UA, though with no dysuria, suprapubic pain, fevers, or CVA tenderness, in the setting of a chronic foley catheter. Preliminary Urine culture shows >10,000 gram negative rods. The patient has had 2 rounds of cipro (7 days starting 10/24, 10 days starting approx 11/12) and 1 round of an unknown antibiotic (4 days, starting 1 week ago at [facility]) recently, and was given ceftriaxone in the ED. His most recent positive urine culture 10/31/10 showed E Coli and Proteus, which was pan-sensitive. Culture 11/23 was negative. Foley catheter was placed in August 2012 during an acute MS flare for urinary retention, with no trial off foley since that time.  -continue ceftriaxone -urine culture showed GNR, will await sensitivty -the patient was placed on foley catheter in August during an acute MS flare. Patient could likely try a trial off of foley, though it may be more appropriate in the outpatient setting than during an acute MS exacerbation. -replaced oley, will consider voiding trial  3. Constipation - the patient has a history of chronic constipation, with currently no bowel movement in the last 2-3 days, with no abdominal pain. The patient is on home dulcolax.  -ordered sennakot S, gave fleet enema per patient's request -consider miralax tomorrow if no BM   4. Prophy - Lovenox   LOS: 1 day   Margorie John 12/27/2010, 3:03 PM

## 2010-12-28 LAB — URINE CULTURE

## 2010-12-28 MED ORDER — SULFAMETHOXAZOLE-TMP DS 800-160 MG PO TABS
1.0000 | ORAL_TABLET | Freq: Two times a day (BID) | ORAL | Status: DC
  Administered 2010-12-28 – 2011-01-04 (×15): 1 via ORAL
  Filled 2010-12-28 (×19): qty 1

## 2010-12-28 NOTE — Progress Notes (Signed)
Internal Medicine Teaching Service Attending Note Date: 12/28/2010  Patient name: Terry Palmer  Medical record number: [id number]  Date of birth: May 06, 1975    This patient has been seen and discussed with the house staff. Please see their note for complete details. I concur with their findings with the following additions/corrections:  Mr. Ortner is feeling better. We will try a trial of d/c foley today. On day #2 of 3 days of solumedrol. Appreciate neurology input.  Jeweline Reif E 12/28/2010, 11:25 AM

## 2010-12-28 NOTE — Progress Notes (Signed)
Subjective: Patient complains of continued constipation, fleet enema did not prompt BM. Denies any pain. Feels subjectively stronger after 1 dose of solumedrol.  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 Right hand is 4/5 and right hip extension is 3/5. Cannot perform dorsiflexion bilaterally.  Lab Results: Basic Metabolic Panel:  Lab 12/26/10 9604 12/26/10 1400  NA 136 141  K 3.7 3.9  CL 104 106  CO2 21 25  GLUCOSE 127* 101*  BUN 12 13  CREATININE 0.93 0.93  CALCIUM 9.2 9.7  MG -- --  PHOS -- --   Liver Function Tests:  Lab 12/26/10 2334  AST 25  ALT 32  ALKPHOS 97  BILITOT 0.3  PROT 6.5  ALBUMIN 3.5   CBC:  Lab 12/26/10 1400  WBC 7.4  NEUTROABS 4.5  HGB 14.7  HCT 42.0  MCV 84.2  PLT 285   Micro Results: Recent Results (from the past 240 hour(s))  URINE CULTURE     Status: Normal   Collection Time   12/26/10  2:16 PM      Component Value Range Status Comment   Specimen Description URINE, CLEAN CATCH    Final    Special Requests ADDED ON [id number] @1627    Final    Setup Time [id number]   Final    Colony Count >=100,000 COLONIES/ML   Final    Culture STENOTROPHOMONAS MALTOPHILIA   Final    Report Status 12/28/2010 FINAL   Final    Organism ID, Bacteria STENOTROPHOMONAS MALTOPHILIA   Final    Studies/Results: Dg Chest 1 View  12/27/2010  *RADIOLOGY REPORT*  Clinical Data: Pneumonia  CHEST - 1 VIEW  Comparison: Portable chest x-ray of 08/21/2009  Findings: No active infiltrate or effusion is seen.  The heart is within normal limits in size.  No bony abnormality is noted.  IMPRESSION: No active lung disease.  Original Report Authenticated By: Juline Patch, M.D.   Medications: I have reviewed the patient's current medications. Scheduled Meds:    . baclofen  10 mg Oral TID  . cefTRIAXone (ROCEPHIN)  IV  1 g Intravenous Q24H  . cholecalciferol  1,000 Units Oral Daily  . enoxaparin  40 mg Subcutaneous Q24H  . feeding supplement  237 mL Oral BID BM  . fish oil-omega-3 fatty acids  1,000 mg Oral Daily  . FLUoxetine  20 mg Oral Daily  .  interferon beta-1b  0.3 mg Subcutaneous QODAY  . methylPREDNISolone (SOLU-MEDROL) injection  1,000 mg Intravenous Daily  . senna-docusate  2 tablet Oral QHS  . vitamin B-12  2,000 mcg Oral Daily  . DISCONTD: interferon beta-1b  1 mg Subcutaneous QODAY   Continuous Infusions:    . sodium chloride 75 mL/hr at 12/28/10 0638   PRN Meds:.bisacodyl, sodium phosphate Assessment/Plan: Assessment & Plan by Problem:  The patient is a 35 yo man with a long-standing history of MS x20 yrs, presenting with weakness consistent with MS flare, and UA results suggestive of catheter-related UTI.   1. Acute on Chronic Multiple Sclerosis - patient has a long-standing history of MS, presenting with symptoms of weakness and decreased appetite, likely consistent with MS flare. [name] most recent MRI of the brain and cervical spine 10/20/10 showed no new lesions, though  the c spine imaging was of poor quality. Repeat MRI is likely not needed at this point  -appreciate neuro recs  -IV solu-medrol X 3 days -will slow normal saline to 50cc/hr since patient taking some PO  2. UTI - patient presents with decreased UOP, darkening of urine, and LE & WBC's seen on UA, though with no dysuria, suprapubic pain, fevers, or CVA tenderness, in the setting of a chronic foley catheter. Preliminary Urine culture shows >10,000 gram negative rods. The patient has had 2 rounds of cipro (7 days starting 10/24, 10 days starting approx 11/12) and 1 round of an unknown antibiotic (4 days, starting 1 week ago at [facility]) recently, and was given ceftriaxone in the ED. His most recent positive urine culture 10/31/10 showed E Coli and Proteus, which was pan-sensitive. Culture 11/23 was negative. Foley catheter was placed in August 2012 during an acute MS flare for urinary retention, with no trial off foley since that time.  -continue ceftriaxone -urine culture showed GNR, will await sensitivty -the patient was placed on foley catheter in August during an acute MS flare. Patient could likely try a trial off of foley, though it may be more appropriate in the outpatient setting than during an acute MS exacerbation. -replaced foley, will consider voiding trial  3. Constipation - the patient has a history of chronic constipation, with currently no bowel movement in the last 2-3 days, with no abdominal pain. The patient is on home dulcolax.  -ordered sennakot S, will try tap water enema today. -consider miralax  4. Prophy - Lovenox   LOS: 2 days   Margorie John 12/28/2010, 8:19 AM

## 2010-12-28 NOTE — Progress Notes (Signed)
[facility] PROGRESS NOTE    SUBJECTIVE   Continues to feel stronger today.  Complains of toothache he has had for two months  OBJECTIVE   Vital signs in last 24 hours: Temp:  [97.9 F (36.6 C)-98.3 F (36.8 C)] 98.3 F (36.8 C) (12/20 0548) Pulse Rate:  [78-110] 78  (12/20 0548) Resp:  [16-18] 18  (12/20 0548) BP: (119-123)/(73-80) 123/73 mmHg (12/20 0548) SpO2:  [96 %-98 %] 96 % (12/20 0548)  Intake/Output from previous day: 12/19 0701 - 12/20 0700 In: 1401 [P.O.:576; I.V.:825] Out: 2300 [Urine:2300] Intake/Output this shift:   Nutritional status: Dysphagia  Past Medical History  Diagnosis Date  . MS (multiple sclerosis)   . UTI (urinary tract infection)   . Childhood asthma     Neurologic Exam:   Mental Status:  Alert, oriented, thought content appropriate. Speech dysarthric (baseline) fluent without evidence of aphasia. Able to follow 3 step commands without difficulty.  Cranial Nerves:  II-Visual fields grossly intact.  III/IV/VI-Extraocular movements intact, his eye movements are not smooth (baseline). Pupils reactive bilaterally.  V/VII-Smile symmetric  VIII-grossly intact  IX/X-normal gag  XI-bilateral shoulder shrug  XII-midline tongue extension  Motor: 4/5 bilaterally UE with normal tone and decreased bulk. 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  Sensory: Pinprick and light touch intact throughout, bilaterally  Deep Tendon Reflexes: 2+ and symmetric throughout UE and bilateral LE shows 4+ patella and achilles with clonus  Plantars: upgioing bilaterally  Cerebellar: Normal finger-to-nose,    Lab Results: Lab Results  Component Value Date/Time   CHOL  Value: 151        ATP III CLASSIFICATION:  <200     mg/dL   Desirable  161-096  mg/dL   Borderline High  >=045    mg/dL   High        04/16/8117  7:06 AM   Lipid Panel No results found for this basename: CHOL,TRIG,HDL,CHOLHDL,VLDL,LDLCALC in the  last 72 hours  Studies/Results: Dg Chest 1 View  12/27/2010  *RADIOLOGY REPORT*  Clinical Data: Pneumonia  CHEST - 1 VIEW  Comparison: Portable chest x-ray of 08/21/2009  Findings: No active infiltrate or effusion is seen.  The heart is within normal limits in size.  No bony abnormality is noted.  IMPRESSION: No active lung disease.  Original Report Authenticated By: Juline Patch, M.D.    Medications:     Scheduled:   . baclofen  10 mg Oral TID  . cholecalciferol  1,000 Units Oral Daily  . enoxaparin  40 mg Subcutaneous Q24H  . feeding supplement  237 mL Oral BID BM  . fish oil-omega-3 fatty acids  1,000 mg Oral Daily  . FLUoxetine  20 mg Oral Daily  . interferon beta-1b  0.3 mg Subcutaneous QODAY  . methylPREDNISolone (SOLU-MEDROL) injection  1,000 mg Intravenous Daily  . senna-docusate  2 tablet Oral QHS  . sulfamethoxazole-trimethoprim  1 tablet Oral Q12H  . vitamin B-12  2,000 mcg Oral Daily  . DISCONTD: cefTRIAXone (ROCEPHIN)  IV  1 g Intravenous Q24H  . DISCONTD: interferon beta-1b  1 mg Subcutaneous QODAY    Assessment/Plan:    Patient Active Hospital Problem List: Multiple sclerosis exacerbation (12/26/2010)   Assessment: MS exacerbation associated with UTI   Plan: Continue with second dose of  solumedrol today (2/3).     Felicie Morn PA-C [facility] [phone]  12/28/2010, 9:24 AM

## 2010-12-29 LAB — COMPREHENSIVE METABOLIC PANEL
ALT: 27 U/L (ref 0–53)
AST: 19 U/L (ref 0–37)
Calcium: 8.7 mg/dL (ref 8.4–10.5)
Sodium: 138 mEq/L (ref 135–145)
Total Protein: 5.7 g/dL — ABNORMAL LOW (ref 6.0–8.3)

## 2010-12-29 LAB — GLUCOSE, CAPILLARY: Glucose-Capillary: 110 mg/dL — ABNORMAL HIGH (ref 70–99)

## 2010-12-29 LAB — CBC
HCT: 34.4 % — ABNORMAL LOW (ref 39.0–52.0)
Hemoglobin: 12 g/dL — ABNORMAL LOW (ref 13.0–17.0)
MCH: 29 pg (ref 26.0–34.0)
MCHC: 34.9 g/dL (ref 30.0–36.0)

## 2010-12-29 MED ORDER — OMEGA-3-ACID ETHYL ESTERS 1 G PO CAPS
1.0000 g | ORAL_CAPSULE | Freq: Every day | ORAL | Status: DC
  Administered 2010-12-30 – 2011-01-04 (×6): 1 g via ORAL
  Filled 2010-12-29 (×6): qty 1

## 2010-12-29 MED ORDER — MORPHINE SULFATE 2 MG/ML IJ SOLN: 2.0000 mg | Freq: Four times a day (QID) | INTRAMUSCULAR | Status: DC | PRN

## 2010-12-29 NOTE — Progress Notes (Addendum)
Subjective: Patient complains of 'bladder pain' which occurs before he voids. He has not complained of this previously. He has condom catheter currently since he voided in his bed yesterday and has a stage two pressure ulcer that should be kept dry. Foley was removed yesterday and patient was able to void but had pain and leg spasms with voiding as well.  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 Right hand is 4/5 and right knee extension is 3/5. Cannot perform dorsiflexion bilaterally.  Lab Results: Basic Metabolic Panel:  Lab 12/26/10 1610 12/26/10 1400  NA 136 141  K 3.7 3.9  CL 104 106  CO2 21 25  GLUCOSE 127* 101*  BUN 12 13  CREATININE 0.93 0.93  CALCIUM 9.2 9.7  MG -- --  PHOS -- --   Liver Function Tests:  Lab 12/26/10 2334  AST 25  ALT 32  ALKPHOS 97  BILITOT 0.3  PROT 6.5  ALBUMIN 3.5   CBC:  Lab 12/26/10 1400  WBC 7.4  NEUTROABS 4.5  HGB 14.7    HCT 42.0  MCV 84.2  PLT 285   Micro Results: Recent Results (from the past 240 hour(s))  URINE CULTURE     Status: Normal   Collection Time   12/26/10  2:16 PM      Component Value Range Status Comment   Specimen Description URINE, CLEAN CATCH   Final    Special Requests ADDED ON [id number] @1627    Final    Setup Time [id number]   Final    Colony Count >=100,000 COLONIES/ML   Final    Culture STENOTROPHOMONAS MALTOPHILIA   Final    Report Status 12/28/2010 FINAL   Final    Organism ID, Bacteria STENOTROPHOMONAS MALTOPHILIA   Final    Studies/Results: Dg Chest 1 View  12/27/2010  *RADIOLOGY REPORT*  Clinical Data: Pneumonia  CHEST - 1 VIEW  Comparison: Portable chest x-ray of 08/21/2009  Findings: No active infiltrate or effusion is seen.  The heart is within normal limits in size.  No bony abnormality is noted.  IMPRESSION: No active lung disease.  Original Report Authenticated By: Juline Patch, M.D.   Medications: I have reviewed the patient's current medications. Scheduled Meds:    . baclofen  10 mg Oral TID  . cholecalciferol  1,000  Units Oral Daily  . enoxaparin  40 mg Subcutaneous Q24H  . feeding supplement  237 mL Oral BID BM  . fish oil-omega-3 fatty acids  1,000 mg Oral Daily  . FLUoxetine  20 mg Oral Daily  . interferon beta-1b  0.3 mg Subcutaneous QODAY  . methylPREDNISolone (SOLU-MEDROL) injection  1,000 mg Intravenous Daily  . senna-docusate  2 tablet Oral QHS  . sulfamethoxazole-trimethoprim  1 tablet Oral Q12H  . vitamin B-12  2,000 mcg Oral Daily   Continuous Infusions:    . sodium chloride 50 mL/hr at 12/29/10 0906   PRN Meds:.bisacodyl, morphine injection Assessment/Plan: Assessment & Plan by Problem:  The patient is a 35 yo man with a long-standing history of MS x20 yrs, presenting with weakness consistent with MS flare, and UA results suggestive of catheter-related UTI.   1. Acute on Chronic Multiple Sclerosis - patient has a long-standing history  of MS, presenting with symptoms of weakness and decreased appetite, likely consistent with MS flare. [name] most recent MRI of the brain and cervical spine 10/20/10 showed no new lesions, though the c spine imaging was of poor quality. Repeat MRI is likely not needed at this point  -appreciate neuro recs  -IV solu-medrol X 3 days -will slow normal saline to 50cc/hr since patient taking some PO  2. UTI - patient presents with decreased UOP, darkening of urine, and LE & WBC's seen on UA, though with no dysuria, suprapubic pain, fevers, or CVA tenderness, in the setting of a chronic foley catheter. Preliminary Urine culture shows >10,000 gram negative rods. The patient has had 2 rounds of cipro (7 days starting 10/24, 10 days starting approx 11/12) and 1 round of an unknown antibiotic (4 days, starting 1 week ago at [facility]) recently, and was given ceftriaxone in the ED. His most recent positive urine culture 10/31/10 showed E Coli and Proteus, which was pan-se nsitive. Culture 11/23 was negative. Foley catheter was placed in August 2012 during an acute MS flare for urinary retention, with no trial off foley since that time.  -continue ceftriaxone -urine culture showed stenotrophomonas, switched ceftriaxone to  bactrim -patient is having bladder pain with removal of foley, will replace it since its benefits seem to outweigh the risks  3. Constipation - the patient has a history of chronic constipation, bowel movement 2 days ago after fleet enema, no abdominal pain. The patient is on home dulcolax.  -ordered sennakot S -consider miralax  4. Dispo- patient will be assessed by PT to see what they recommend as far as discharge.  5. Prophy - Lovenox   LOS: 3 days   Margorie John 12/29/2010, 11:29 AM

## 2010-12-29 NOTE — Progress Notes (Signed)
Internal Medicine Teaching Service Attending Note Date: 12/29/2010  Patient name: Terry Palmer  Medical record number: [id number]  Date of birth: 20-Mar-1975   I have seen and evaluated Terry Palmer and discussed their care with the Residency Team.   I spoke at length with patient's wife, [name]. She mentions that the social worker who works with family as an outpatient was discussing ?possible rehab for patient. We will see if that would be possible and will get PT/SW input for this. Patient has gotten considerably worse in the last 4-5 months and is a huge responsibility for wife and her 59 and 47 year old children. It sounds like the trial of the foley being out has not been successful as it has caused increased muscle spasms in LE and he has not done well with it out.    Physical Exam: Blood pressure 117/77, pulse 92, temperature 98.7 F (37.1 C), temperature source Oral, resp. rate 18, height 6\' 4"  (1.93 m), weight 166 lb 14.4 oz (75.705 kg), SpO2 97.00%. Sleeping comfortably, occasional LE spasm   Lab results: Results for orders placed during the hospital encounter of 12/26/10 (from the past 24 hour(s))  GLUCOSE, CAPILLARY     Status: Abnormal   Collection Time   12/29/10  7:24 AM      Component Value Range   Glucose-Capillary 110 (*) 70 - 99 (mg/dL)  COMPREHENSIVE METABOLIC PANEL     Status: Abnormal   Collection Time   12/29/10 11:40 AM      Component Value Range   Sodium 138  135 - 145 (mEq/L)   Potassium 3.3 (*) 3.5 - 5.1 (mEq/L)   Chloride 108  96 - 112 (mEq/L)   CO2 19  19 - 32 (mEq/L)   Glucose, Bld 125 (*) 70 - 99 (mg/dL)   BUN 10  6 - 23 (mg/dL)   Creatinine, Ser 8.29  0.50 - 1.35 (mg/dL)   Calcium 8.7  8.4 - 56.2 (mg/dL)   Total Protein 5.7 (*) 6.0 - 8.3 (g/dL)   Albumin 2.9 (*) 3.5 - 5.2 (g/dL)   AST 19  0 - 37 (U/L)   ALT 27  0 - 53 (U/L)   Alkaline Phosphatase 76  39 - 117 (U/L)   Total Bilirubin 0.2 (*) 0.3 - 1.2 (mg/dL)   GFR calc non Af Amer >90   >90 (mL/min)   GFR calc Af Amer >90  >90 (mL/min)  CBC     Status: Abnormal   Collection Time   12/29/10 11:40 AM      Component Value Range   WBC 22.4 (*) 4.0 - 10.5 (K/uL)   RBC 4.14 (*) 4.22 - 5.81 (MIL/uL)   Hemoglobin 12.0 (*) 13.0 - 17.0 (g/dL)   HCT 13.0 (*) 86.5 - 52.0 (%)   MCV 83.1  78.0 - 100.0 (fL)   MCH 29.0  26.0 - 34.0 (pg)   MCHC 34.9  30.0 - 36.0 (g/dL)   RDW 78.4  69.6 - 29.5 (%)   Platelets 273  150 - 400 (K/uL)    Imaging results:  No results found.  Assessment and Plan: I agree with the formulated Assessment and Plan with the following changes:  Will ask for PT and SW input regarding possible rehab. Dr. Maisie Fus spoke with Dr. Thad Ranger, neurology, and it sounds like patient probably needs to keep the foley catheter in at this point as he has increased spacticity and problems with it out.

## 2010-12-29 NOTE — Progress Notes (Signed)
[facility] PROGRESS NOTE    SUBJECTIVE   He feels stronger overall, his main concern at this time is bladder pain.    OBJECTIVE   Vital signs in last 24 hours: Temp:  [98.4 F (36.9 C)-98.8 F (37.1 C)] 98.7 F (37.1 C) (12/21 0407) Pulse Rate:  [84-126] 92  (12/21 0407) Resp:  [17-18] 18  (12/21 0407) BP: (117-141)/(70-79) 117/77 mmHg (12/21 0407) SpO2:  [97 %-99 %] 97 % (12/21 0407)  Intake/Output from previous day: 12/20 0701 - 12/21 0700 In: 904 [P.O.:354; I.V.:550] Out: 200 [Urine:200] Intake/Output this shift: Total I/O In: 358 [P.O.:358] Out: -  Nutritional status: Dysphagia  Past Medical History  Diagnosis Date  . MS (multiple sclerosis)   . UTI (urinary tract infection)   . Childhood asthma     Neurologic Exam:   Mental Status:  Alert, drowsy, oriented, thought content appropriate. Speech dysarthric (baseline) fluent without evidence of aphasia. Able to follow 3 step commands without difficulty.  Cranial Nerves:  II-Visual fields grossly intact.  III/IV/VI-Extraocular movements intact, his eye movements are not smooth (baseline). Pupils reactive bilaterally.  V/VII-Smile symmetric  VIII-grossly intact  IX/X-normal gag  XI-bilateral shoulder shrug  XII-midline tongue extension  Motor: 4/5 bilaterally UE with normal tone and decreased bulk. 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  Sensory: Pinprick and light touch intact throughout, bilaterally  Deep Tendon Reflexes: 2+ and symmetric throughout UE and bilateral LE shows 4+ patella and achilles with clonus  Plantars: upgioing bilaterally  Cerebellar: Normal finger-to-nose,    Lab Results: Lab Results  Component Value Date/Time   CHOL  Value: 151        ATP III CLASSIFICATION:  <200     mg/dL   Desirable  161-096  mg/dL   Borderline High  >=045    mg/dL   High        04/16/8117  7:06 AM   Lipid Panel No results found for this basename:  CHOL,TRIG,HDL,CHOLHDL,VLDL,LDLCALC in the last 72 hours  Studies/Results: Dg Chest 1 View  12/27/2010  *RADIOLOGY REPORT*  Clinical Data: Pneumonia  CHEST - 1 VIEW  Comparison: Portable chest x-ray of 08/21/2009  Findings: No active infiltrate or effusion is seen.  The heart is within normal limits in size.  No bony abnormality is noted.  IMPRESSION: No active lung disease.  Original Report Authenticated By: Juline Patch, M.D.    Medications:     Scheduled:   . baclofen  10 mg Oral TID  . cholecalciferol  1,000 Units Oral Daily  . enoxaparin  40 mg Subcutaneous Q24H  . feeding supplement  237 mL Oral BID BM  . fish oil-omega-3 fatty acids  1,000 mg Oral Daily  . FLUoxetine  20 mg Oral Daily  . interferon beta-1b  0.3 mg Subcutaneous QODAY  . methylPREDNISolone (SOLU-MEDROL) injection  1,000 mg Intravenous Daily  . senna-docusate  2 tablet Oral QHS  . sulfamethoxazole-trimethoprim  1 tablet Oral Q12H  . vitamin B-12  2,000 mcg Oral Daily    Assessment/Plan:    Patient Active Hospital Problem List: Multiple sclerosis exacerbation (12/26/2010)   Assessment: feeling stronger   Plan: No further recommendations.  Continue to treat UTI. Neurology will S/O    Felicie Morn PA-C [facility] [phone]  12/29/2010, 10:48  AM

## 2010-12-29 NOTE — Progress Notes (Signed)
Physical Therapy Evaluation Patient Details Name: Terry Palmer MRN: [id number] DOB: Jun 01, 1975 Today's Date: 12/29/2010  Problem List:  Patient Active Problem List  Diagnoses  . Multiple sclerosis exacerbation  . UTI (urinary tract infection)  . Asthma    Past Medical History:  Past Medical History  Diagnosis Date  . MS (multiple sclerosis)   . UTI (urinary tract infection)   . Childhood asthma    Past Surgical History: History reviewed. No pertinent past surgical history.  PT Assessment/Plan/Recommendation PT Assessment Clinical Impression Statement: Pt with baseline MS who has not stood in 3 years and non ambulatory for 5 years per pt report who is total care at home by wife. Pt is at baseline functional status and not appropriate for further therapy services. Should pt return home with wife recommend aide and will continue to need 24 hour total care. Should family decide they can no longer be care providers SNF recommended. Daily ROM would be beneficial to help prevent contractures which can be performed by nursing as wife performs daily at baseline per pt.  PT Recommendation/Assessment: Patent does not need any further PT services No Skilled PT: Patient unable to participate in therapy;Patient at baseline level of functioning PT Recommendation Follow Up Recommendations: None Equipment Recommended: None recommended by PT PT Goals     PT Evaluation Precautions/Restrictions    Prior Functioning  Home Living Lives With: Spouse;Family Receives Help From: Family Type of Home: House Home Layout: One level Home Access: Level entry Bathroom Toilet: Standard Bathroom Accessibility: No Home Adaptive Equipment: Other (comment);Bedside commode/3-in-1;Wheelchair - powered;Wheelchair - manual;Walker - rolling;Tub transfer bench;Hospital bed (hoyer lift) Prior Function Level of Independence: Needs assistance with ADLs;Needs assistance with tranfers;Needs assistance with  homemaking Bath: Maximal Toileting: Maximal Dressing: Maximal Feeding: Supervision/set-up Meal Prep: Total Light Housekeeping: Total Driving: No Comments: Pt lifted to WC via hoyer lift for 6 months. Wife home with pt as caregiver 24 hours a day. Pt has been non-ambulatory for 5 years Cognition Cognition Arousal/Alertness: Awake/alert Overall Cognitive Status: Impaired Orientation Level: Oriented X4 Safety/Judgement: Decreased safety judgement for tasks assessed Decreased Safety/Judgement: Decreased awareness of need for assistance Problem Solving: Requires assistance for problem solving Sensation/Coordination   Extremity Assessment RUE Assessment RUE Assessment: Exceptions to [facility] RUE AROM (degrees) Overall AROM Right Upper Extremity: Deficits (Pt with PROM WFL and grossly 2+/5 motion) LUE Assessment LUE Assessment: Exceptions to [facility] LUE Strength LUE Overall Strength: Deficits (Pt PROM WFL, strength grossly 2-/5) RLE Assessment RLE Assessment: Exceptions to [facility] RLE PROM (degrees) Overall PROM Right Lower Extremity: Deficits (PROM WFL, unable to Dorsiflex past neutral) RLE Strength RLE Overall Strength: Deficits;Due to premorbid status RLE Overall Strength Comments: Pt with no AROM RLE, spastic tremors with positioning into hip flexion lasting approximately 5 sec LLE Assessment LLE Assessment: Exceptions to WFL LLE PROM (degrees) Overall PROM Left Lower Extremity: Within functional limits for tasks assessed;Other (Comment) (unable to dorsiflex past neutral) LLE Strength LLE Overall Strength: Due to premorbid status;Deficits LLE Overall Strength Comments: Pt without AROM and spastic tremors with dorsiflexion and hip flexion at hip and ankle Mobility (including Balance) Bed Mobility Bed Mobility: Yes Rolling Right: 2: Max assist;With rail Rolling Right Details (indicate cue type and reason): cueing and assist to sequence transfer and complete Rolling Left: 2: Max  assist;With rail Rolling Left Details (indicate cue type and reason): cueing and assist to sequence transfer Supine to Sit: 1: +2 Total assist;Patient percentage (comment) (pt = 10%) Supine to Sit Details (indicate cue type and  reason): Pt attempting to help pull up with rail but unable to advance bil LE which were total A to EOB, pt with trunk extension and inability to initiate hip flexion with legs EOB despite total assist to attempt to lift trunk off bed.  Scooting to [facility]: 1: +2 Total assist;Patient percentage (comment) (pt = 5%) Scooting to [facility] Details (indicate cue type and reason): pt using RUE to assist sliding up in bed with rail Transfers Transfers: No Ambulation/Gait Ambulation/Gait: No Stairs: No Wheelchair Mobility Wheelchair Mobility: No  Posture/Postural Control Posture/Postural Control: Postural limitations Postural Limitations: Pt with extension of trunk with attempt into sitting Balance Balance Assessed: No Exercise  Other Exercises Other Exercises: PROM provided to Bil LE hip and knee End of Session PT - End of Session Activity Tolerance: Patient tolerated treatment well Patient left: in bed Nurse Communication: Mobility status for transfers;Need for lift equipment;Other (comment) (discussed with nurse discharge options) General Behavior During Session: [facility] for tasks performed Cognition: Impaired, at baseline  Delorse Lek 12/29/2010, 4:27 PM  Toney Sang, PT [phone]

## 2010-12-29 NOTE — ED Provider Notes (Signed)
Evaluation and management procedures were performed by the PA/NP under my supervision/collaboration.    Roch Quach D Harshil Cavallaro, MD 12/29/10 1522 

## 2010-12-30 MED ORDER — SULFAMETHOXAZOLE-TMP DS 800-160 MG PO TABS: 1.0000 | ORAL_TABLET | Freq: Two times a day (BID) | ORAL | Status: DC

## 2010-12-30 NOTE — Progress Notes (Signed)
Subjective: States the control of his upper extremities are improved.  Has completed IV methylprednisone.  Does not require PO taper.  Objective: BP 117/72  Pulse 66  Temp(Src) 97.7 F (36.5 C) (Oral)  Resp 16  Ht 6\' 4"  (1.93 m)  Wt 75.705 kg (166 lb 14.4 oz)  BMI 20.32 kg/m2  SpO2 96%  Intake/Output from previous day: 12/21 0701 - 12/22 0700 In: 1988 [P.O.:838; I.V.:1150] Out: 2850 [Urine:2850]  Medications:  Scheduled:   . baclofen  10 mg Oral TID  . cholecalciferol  1,000 Units Oral Daily  . enoxaparin  40 mg Subcutaneous Q24H  . feeding supplement  237 mL Oral BID BM  . FLUoxetine  20 mg Oral Daily  . interferon beta-1b  0.3 mg Subcutaneous QODAY  . omega-3 acid ethyl esters  1 g Oral Daily  . senna-docusate  2 tablet Oral QHS  . sulfamethoxazole-trimethoprim  1 tablet Oral Q12H  . vitamin B-12  2,000 mcg Oral Daily  . DISCONTD: fish oil-omega-3 fatty acids  1,000 mg Oral Daily    Neurologic Exam: Mental Status: Alert, oriented, thought content appropriate.  Speech fluent with base line dysarthria and hypophonia.   Able to follow commands without difficulty. Cranial Nerves: II- Visual fields grossly intact. III/IV/VI-Extraocular movements intact, conjugate and roving.   Pupils reactive bilaterally. V/VII-Smile symmetric VIII-hearing grossly intact XI-bilateral shoulder shrug XII-midline tongue extension Motor: Able to raise both arms spontaneously to 140 degrees.  Some dysmetria bilaterally.  Unable to move lower extremities (chronic).  Increased tone especially LE's.   Sensory: Light touch intact throughout, bilaterally Deep Tendon Reflexes: 1+ uppers, 3+ lowers and multi-jerk clonus achilles bilaterally. Plantars: Downgoing bilaterally   Lab Results: Results for orders placed during the hospital encounter of 12/26/10 (from the past 48 hour(s))  GLUCOSE, CAPILLARY     Status: Abnormal   Collection Time   12/29/10  7:24 AM      Component Value Range Comment    Glucose-Capillary 110 (*) 70 - 99 (mg/dL)   COMPREHENSIVE METABOLIC PANEL     Status: Abnormal   Collection Time   12/29/10 11:40 AM      Component Value Range Comment   Sodium 138  135 - 145 (mEq/L)    Potassium 3.3 (*) 3.5 - 5.1 (mEq/L)    Chloride 108  96 - 112 (mEq/L)    CO2 19  19 - 32 (mEq/L)    Glucose, Bld 125 (*) 70 - 99 (mg/dL)    BUN 10  6 - 23 (mg/dL)    Creatinine, Ser 9.62  0.50 - 1.35 (mg/dL)    Calcium 8.7  8.4 - 10.5 (mg/dL)    Total Protein 5.7 (*) 6.0 - 8.3 (g/dL)    Albumin 2.9 (*) 3.5 - 5.2 (g/dL)    AST 19  0 - 37 (U/L)    ALT 27  0 - 53 (U/L)    Alkaline Phosphatase 76  39 - 117 (U/L)    Total Bilirubin 0.2 (*) 0.3 - 1.2 (mg/dL)    GFR calc non Af Amer >90  >90 (mL/min)    GFR calc Af Amer >90  >90 (mL/min)   CBC     Status: Abnormal   Collection Time   12/29/10 11:40 AM      Component Value Range Comment   WBC 22.4 (*) 4.0 - 10.5 (K/uL)    RBC 4.14 (*) 4.22 - 5.81 (MIL/uL)    Hemoglobin 12.0 (*) 13.0 - 17.0 (g/dL)    HCT  34.4 (*) 39.0 - 52.0 (%)    MCV 83.1  78.0 - 100.0 (fL)    MCH 29.0  26.0 - 34.0 (pg)    MCHC 34.9  30.0 - 36.0 (g/dL)    RDW 16.1  09.6 - 04.5 (%)    Platelets 273  150 - 400 (K/uL)     Studies/Results: No results found.    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  He has improved control of his upper extremities since receiving IV solu-medrol.   Agree with primary team's disposition.     LOS: 4 days   Marya Fossa PA-C [facility] [phone] 12/30/2010  11:07 AM

## 2010-12-30 NOTE — Progress Notes (Addendum)
Discharge Note:  S: patient has no complaints, is sleeping comfortably  O:  Filed Vitals:   12/30/10 0529  BP: 117/72  Pulse: 66  Temp: 97.7 F (36.5 C)  Resp: 16   General: young man resting in bed in no acute distress HEENT: PERRL, EOMI, no scleral icterus Cardiac: RRR, no rubs, murmurs or gallops Pulm: clear to auscultation bilaterally, moving normal volumes of air Abd: soft, nontender, nondistended, BS present Ext: warm and well perfused, no pedal edema Neuro: alert and oriented X3, cranial nerves II-XII grossly intact  A/P: will DC IV and plan to discharge patient to home with aide or to SNF based on him and his wife's preferences. Patient would prefer to go home. Has finished steroid course and seems to have quite good use of his left arm with some use of his right arm.   Addendum: patient' wife is wavering in her decision for him to return home, she agrees that SNF is probably better for him and her family. Will discuss with case management tomorrow. He,has had frequent hospitalizations since August.

## 2010-12-30 NOTE — Progress Notes (Signed)
   CARE MANAGEMENT NOTE 12/30/2010  Patient:  Terry Palmer, Terry Palmer   Account Number:  [id number]  Date Initiated:  12/27/2010  Documentation initiated by:  Ronny Flurry  Subjective/Objective Assessment:   DX:  Weakness     Action/Plan:   discharge planning   Anticipated DC Date:  12/30/2010   Anticipated DC Plan:  HOME/SELF CARE      DC Planning Services  CM consult      Choice offered to / List presented to:             Status of service:  In process, will continue to follow Medicare Important Message given?   (If response is "NO", the following Medicare IM given date fields will be blank) Date Medicare IM given:   Date Additional Medicare IM given:    Discharge Disposition:    Per UR Regulation:  Reviewed for med. 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  He lives at home with his wife and children.  12/30/10 1111--New orders for pt. for [facility] aide.  Spouse not at bedside yet this am.  Will await spouse to visit to speak with her about [facility] choice.  Tera Mater, RN, BSN Case Manager # [phone]

## 2010-12-30 NOTE — Discharge Summary (Signed)
Internal Medicine Teaching [facility] Discharge Note  Name: Terry Palmer MRN: [id number] DOB: 1975-06-07 35 y.o.  Date of Admission: 12/26/2010  1:13 PM Date of Discharge: 12/30/2010 Attending Physician: Zoila Shutter  Discharge Diagnosis: Principal Problem:  Multiple sclerosis exacerbation- patient is able to move neck and left arm well at baseline and can move his right arm to a lesser degree. He seems to be near his baseline. Active Problems:  UTI (urinary tract infection)- patient has had foley catheter since August per his wife Placement in SNF- currently will defer placement but will likely be necessary in near future  Discharge Medications: Current Discharge Medication List    START taking these medications   Details  sulfamethoxazole-trimethoprim (BACTRIM DS) 800-160 MG per tablet Take 1 tablet by mouth every 12 (twelve) hours. Qty: 15 tablet, Refills: 0      CONTINUE these medications which have NOT CHANGED   Details  baclofen (LIORESAL) 10 MG tablet Take 10 mg by mouth 3 (three) times daily.     bisacodyl (DULCOLAX) 5 MG EC tablet Take 10 mg by mouth daily as needed. For constipation     cholecalciferol (VITAMIN D) 1000 UNITS tablet Take 1,000 Units by mouth daily.      fish oil-omega-3 fatty acids 1000 MG capsule Take 1,200 mg by mouth daily.      FLUoxetine (PROZAC) 20 MG capsule Take 20 mg by mouth daily.     Garlic 1000 MG CAPS Take 1,000 mg by mouth daily.      interferon beta-1b (BETASERON) 0.3 MG injection Inject 1 mg into the skin every other day.     vitamin B-12 (CYANOCOBALAMIN) 1000 MCG tablet Take 2,000 mcg by mouth daily.         Disposition and follow-up:   Terry Palmer was discharged from [facility] in Stable condition.    Follow-up Appointments: Follow-up Information    Follow up with AVBUERE,EDWIN A, MD. Call in 1 week. (please call his office and make an appointment within the next 1-2 weeks)       Follow up with Lesly Dukes, MD. Call in 1 week. (Please call Dr. Anne Hahn and make appointmetn with in the next 1-2 weeks)    Contact information:   [address], [address] [place] [address] [facility] [place] [address] [phone]    Consultations: Treatment Team:  [facility]  Procedures Performed:  Dg Chest 1 View  12/27/2010  *RADIOLOGY REPORT*  Clinical Data: Pneumonia  CHEST - 1 VIEW  Comparison: Portable chest x-ray of 08/21/2009  Findings: No active infiltrate or effusion is seen.  The heart is within normal limits in size.  No bony abnormality is noted.  IMPRESSION: No active lung disease.  Original Report Authenticated By: Juline Patch, M.D.   Admission HPI:  The patient is a 35 yo man with a long-standing history of MS x20 yrs, presenting with weakness. The patient notes a 1-week history of generalized weakness, as well as increased weakness in his hands and feet, interfering with completion of his daily tasks. He also notes decreased appetite during this time period, and notes that he did not eat anything on the day prior to admission. 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 The patient has also had several prior UTI's over the last 2 months, treated  with cipro 500 mg BID x7 days on 10/24, cipro 500 mg BID x10 days around 11/12 (approx), and a different antibiotic x4 days 1 week ago at [facility]. He currently notes no dysuria, suprapubic pain, or CVA tenderness, but notes some decreased UOP x5 days, with darkening of his urine from his foley catheter. The patient also notes some constipation recently, with his last bowel movement 2-3 days ago. The patient notes no cp, sob, cough, fevers, chills, abd pain, nausea, or vomiting.  Admitting Physical Exam:  Blood pressure 115/76,  pulse 86, temperature 97.6 F (36.4 C), temperature source Oral, resp. rate 17, height 6\' 4"  (1.93 m), weight 166 lb 14.4 oz (75.705 kg), SpO2 97.00%.  Pleasant male laying in bed, quadraplegic.  Lungs-clear  Heart-RRR  Abdom-+BS, NT  Extrem-no edema  Neuro-some baseline dysarthria, but very alert and oriented and aware of what is going on  Spastic quadriparesis, UE 3+/5 strength with muscle wasting, sensation grossly intact.  Hospital Course by problem list: Principal Problem:  *Multiple sclerosis exacerbation- patient was treated with 3 days of IV solumedrol by neurology team. He seemed to return to his baseline of having good functionality of his left arm and some functionality of right arm with good head and neck movement. Had recent flare per his wife treated at [facility] 2 months ago.   UTI (urinary tract infection)- patient was found to have stenotrophomonas on his urine culture so he was treated with Bactrim for 7 days. He had previously completed courses of ciprofloxacin as an outpatient. He has had an indwelling foley catheter since August which was removed with the hope of him not requiring a catheter. However, patient had violent leg spasms when trying to void and had sharp abdominal pains when trying to void that were likely caused by bladder spasms. Because these issues would make it very difficult for him to use a urinal or be straight-catherized we replaced his foley catheter.  Placement in Skilled Nursing Facility- Spoke with patient's wife [name] and she was explaining how difficult it is to care for her husband at home especially since she has lupus herself and they have a 37 year old daughter. She said that he has been in and out of the hospital since August. We do not have those records here, so was likely at outside hospital. She agreed that a skilled nursing facility would be a good idea since it is too difficult for her to care for him by herself and their insurance will  not cover a home health aide. Social work tired to find placeent for patient but only one facility, [facility] would consider patient due to insurance issues and young age. Patient's wife then changed her mind and said she wishes to take him home. Physical therapy evaluated patient and recommended SNF or home with home aide. No further rehabilitation recommendations were made.  Discharge Vitals:  BP 117/72  Pulse 66  Temp(Src) 97.7 F (36.5 C) (Oral)  Resp 16  Ht 6\' 4"  (1.93 m)  Wt 166 lb 14.4 oz (75.705 kg)  BMI 20.32 kg/m2  SpO2 96%  Discharge Labs:  Results for orders placed during the hospital encounter of 12/26/10 (from the past 24 hour(s))  COMPREHENSIVE METABOLIC PANEL     Status: Abnormal   Collection Time   12/29/10 11:40 AM      Component Value Range   Sodium 138  135 - 145 (mEq/L)   Potassium 3.3 (*) 3.5 - 5.1 (mEq/L)   Chloride 108  96 - 112 (mEq/L)   CO2 19  19 - 32 (mEq/L)   Glucose, Bld 125 (*) 70 - 99 (mg/dL)   BUN 10  6 - 23 (mg/dL)   Creatinine, Ser 1.61  0.50 - 1.35 (mg/dL)   Calcium 8.7  8.4 - 09.6 (mg/dL)   Total Protein 5.7 (*) 6.0 - 8.3 (g/dL)   Albumin 2.9 (*) 3.5 - 5.2 (g/dL)   AST 19  0 - 37 (U/L)   ALT 27  0 - 53 (U/L)   Alkaline Phosphatase 76  39 - 117 (U/L)   Total Bilirubin 0.2 (*) 0.3 - 1.2 (mg/dL)   GFR calc non Af Amer >90  >90 (mL/min)   GFR calc Af Amer >90  >90 (mL/min)  CBC     Status: Abnormal   Collection Time   12/29/10 11:40 AM      Component Value Range   WBC 22.4 (*) 4.0 - 10.5 (K/uL)   RBC 4.14 (*) 4.22 - 5.81 (MIL/uL)   Hemoglobin 12.0 (*) 13.0 - 17.0 (g/dL)   HCT 04.5 (*) 40.9 - 52.0 (%)   MCV 83.1  78.0 - 100.0 (fL)   MCH 29.0  26.0 - 34.0 (pg)   MCHC 34.9  30.0 - 36.0 (g/dL)   RDW 81.1  91.4 - 78.2 (%)   Platelets 273  150 - 400 (K/uL)   Signed: THOMAS, Baillie Mohammad 12/30/2010, 11:34 AM

## 2010-12-30 NOTE — Progress Notes (Signed)
1700  Pt was for d/c home today, CM waited to speak with wife regarding HH for home, wife arrived late, MD wrote d/c orders, attempt to speak with wife regarding d/c instructions, pt states she went home as she had children with her.  RN called pt family, pt father states pt is going to nursing home, wife seems confused still asking questions about possible short term rehab at nursing home, she does not really want him to go to "nursing home", but thinks he can improve and come home eventually.  Spoke with Dr Janee Morn, she in turn called the wife, got the same feeling that wife is confused as she has been told pt has debilitating illness that will not improve, so for now pt will stay another night and work with SW tomorrow regarding sending pt to SNF hoping to quickly resolve discharge issue as is on holiday weekend.  Bonney Leitz RN

## 2010-12-31 MED ORDER — WHITE PETROLATUM GEL
Status: AC
  Administered 2010-12-31: 06:00:00
  Filled 2010-12-31: qty 5

## 2010-12-31 NOTE — Progress Notes (Signed)
S: patient has no complaints, is sleepy but ate a full breakfast  O:  Filed Vitals:   12/31/10 0659  BP: 102/70  Pulse: 77  Temp: 97.9 F (36.6 C)  Resp: 20  General: tall young man resting in bed HEENT: PERRL, EOMI, no scleral icterus Cardiac: RRR, no rubs, murmurs or gallops Pulm: clear to auscultation bilaterally, moving normal volumes of air Abd: soft, nontender, nondistended, BS present Ext: warm and well perfused, no pedal edema Neuro: alert and oriented X3, cranial nerves II-XII grossly intact  A/P: Spoke with wife Terry Palmer last evening and she think SNF is the right decision for her husband and her family since he requires 24 hours care and she has lupus herself and a 107 year old daughter. Patient often wakes up in pain in the middle of the night and requires leg rubbing etc. Spoke with care manager and she contacted social worker and I left message for SW myself. She will discuss options for SNF with the patient and his wife today.

## 2010-12-31 NOTE — Progress Notes (Signed)
CSW received consult for SNF from [place], [place]. CSW spoke with pt's wife who wishes to pursue SNF. See chart for full eval and FL2. Will f/u with offers as available.  Dellie Burns, MSW, [place] [phone] (weekend)

## 2010-12-31 NOTE — Progress Notes (Signed)
   CARE MANAGEMENT NOTE 12/31/2010  Patient:  Terry Palmer, Terry Palmer   Account Number:  [id number]  Date Initiated:  12/27/2010  Documentation initiated by:  Ronny Flurry  Subjective/Objective Assessment:   DX:  Weakness     Action/Plan:   discharge planning   Anticipated DC Date:  12/30/2010   Anticipated DC Plan:  HOME/SELF CARE  In-house referral  Clinical Social Worker      DC Planning Services  CM consult      Choice offered to / List presented to:             Status of service:  In process, will continue to follow Medicare Important Message given?  NO (If response is "NO", the following Medicare IM given date fields will be blank) Date Medicare IM given:   Date Additional Medicare IM given:    Discharge Disposition:    Per UR Regulation:  Reviewed for med. 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  He lives at home with his wife and children.  12/31/10 0900--This NCM spoke with spouse, [name] at [phone]) about HH vs SNF.  [name] decided that she wanted pt. to go to SNF due to her not being able to adequately care for him along with trying to take care of their children.  TC to Josi, CSW [phone]) to make referral for new SNF placement.  NCM will continue to follow for any further discharge needs.  Tera Mater, RN, BSN Case Manager [phone]   12/30/10 1111--New orders for pt. for [facility] aide.  Spouse not at bedside yet this am.  Will await spouse to visit to speak with her about [facility] choice.  Tera Mater, RN, BSN Case Manager # [phone]

## 2011-01-01 LAB — BASIC METABOLIC PANEL
BUN: 10 mg/dL (ref 6–23)
Chloride: 104 mEq/L (ref 96–112)
GFR calc Af Amer: >90 mL/min (ref >90)
GFR calc non Af Amer: >90 mL/min (ref >90)
Glucose, Bld: 84 mg/dL (ref 70–99)
Potassium: 3.1 mEq/L — ABNORMAL LOW (ref 3.5–5.1)
Sodium: 137 mEq/L (ref 135–145)

## 2011-01-01 LAB — CBC
HCT: 37.8 % — ABNORMAL LOW (ref 39.0–52.0)
Hemoglobin: 13 g/dL (ref 13.0–17.0)
MCHC: 34.4 g/dL (ref 30.0–36.0)
RDW: 14.3 % (ref 11.5–15.5)
WBC: 10.3 10*3/uL (ref 4.0–10.5)

## 2011-01-01 MED ORDER — POTASSIUM CHLORIDE CRYS ER 20 MEQ PO TBCR
40.0000 meq | EXTENDED_RELEASE_TABLET | Freq: Two times a day (BID) | ORAL | Status: AC
  Administered 2011-01-01 (×2): 40 meq via ORAL
  Filled 2011-01-01 (×2): qty 2

## 2011-01-01 NOTE — Progress Notes (Signed)
S: patient has no complaints, is doing well. He is not able to tell me if he spoke to social worker yesterday.  O:  Filed Vitals:   01/01/11 0604  BP: 99/69  Pulse: 85  Temp: 98.1 F (36.7 C)  Resp: 20  General: tall young man resting in bed HEENT: PERRL, EOMI, no scleral icterus Cardiac: RRR, no rubs, murmurs or gallops Pulm: mild crackles heard on lateral-posterior lungs, moving normal volumes of air Abd: soft, nontender, nondistended, BS present Ext: warm and well perfused, no pedal edema Neuro: alert and oriented X3, cranial nerves II-XII grossly intact  A/P: Spoke to social worker Lupita Leash and she notes that patient is being considered for one SNF but that it will likely be a difficult placement since he is so young and doesn't have extensive insurance. Also may be delayed due to holiday closings at [facility].   Will stop normal saline since patient is taking good PO. Repleted his potassium X2 today.

## 2011-01-01 NOTE — Progress Notes (Signed)
Occupational Therapy Evaluation Patient Details Name: Terry Palmer MRN: [id number] DOB: 09/01/1975 Today's Date: 01/01/2011  Problem List:  Patient Active Problem List  Diagnoses  . Multiple sclerosis exacerbation  . UTI (urinary tract infection)  . Asthma    Past Medical History:  Past Medical History  Diagnosis Date  . MS (multiple sclerosis)   . UTI (urinary tract infection)   . Childhood asthma    Past Surgical History: History reviewed. No pertinent past surgical history.  OT Assessment/Plan/Recommendation OT Assessment Clinical Impression Statement: Pt appears to be at baseline level of functioning.  Pt. reports he was receiving HHOT and HHPT until ~ 2wks PTA.   No acute OT needs identified, but recommend OT at SNF.  Will sign off  OT Recommendation/Assessment: All further OT needs can be met in the next venue of care OT Problem List: Decreased strength;Decreased range of motion;Decreased activity tolerance;Impaired balance (sitting and/or standing);Impaired vision/perception;Decreased coordination;Decreased cognition;Impaired UE functional use OT Therapy Diagnosis : Generalized weakness;Disturbance of vision;Ataxia OT Recommendation Follow Up Recommendations: Skilled nursing facility Equipment Recommended: None recommended by OT Individuals Consulted Consulted and Agree with Results and Recommendations: Patient OT Goals    OT Evaluation Precautions/Restrictions    Prior Functioning Home Living Lives With: Spouse;Family Receives Help From: Family Type of Home: House Home Layout: One level Home Access: Level entry Bathroom Shower/Tub:  (N/A - sponge bathes) Bathroom Toilet: Standard Bathroom Accessibility: No Home Adaptive Equipment: Other (comment);Bedside commode/3-in-1;Wheelchair - powered;Wheelchair - manual;Walker - rolling;Tub transfer bench;Hospital bed Additional Comments: Plan is for pt to d/c to SNF Prior Function Level of Independence: Needs  assistance with ADLs;Needs assistance with tranfers;Needs assistance with homemaking Bath: Maximal Toileting: Maximal Dressing: Maximal Grooming: Maximal Feeding: Supervision/set-up Meal Prep: Total Light Housekeeping: Total Able to Take Stairs?: No Driving: No Vocation: On disability ADL ADL Eating/Feeding: Simulated;Set up Eating/Feeding Details (indicate cue type and reason): Pt. eats finger foods due to difficulty managing/manipulating utensils.  Discussed use of built of handles for utensils, but pt. prefers finger foods.  Pt. was provided with lidded mug to prevent spills Where Assessed - Eating/Feeding: Bed level Grooming: Simulated;Maximal assistance;Wash/dry face;Wash/dry hands;Teeth care;Brushing hair Where Assessed - Grooming: Supine, head of bed up Upper Body Bathing: Simulated;+1 Total assistance Where Assessed - Upper Body Bathing: Supine, head of bed up Lower Body Bathing: Simulated;+1 Total assistance Where Assessed - Lower Body Bathing: Supine, head of bed up;Rolling right and/or left Upper Body Dressing: Not assessed ADL Comments: Pt. feels he is at or close to baseline level of functioning.  He was max A - total A with BADLs PTA except for self feeding.  Pt. was provided with lidded mug to aid in independence Vision/Perception  Vision - History Baseline Vision: Other (comment) (Pt. with nystagmus and oscillopsia) Visual History: Other (comment) (visual impairment due to MS) Patient Visual Report: No change from baseline Vision - Assessment Eye Alignment: Impaired (comment) (Pt. with continuous nystagmus and oscillopsia) Vision Assessment: Vision not tested Additional Comments: Vision appears to be at baseline Cognition Cognition Arousal/Alertness: Lethargic Overall Cognitive Status: Difficult to assess Difficult to assess due to: level of arousal (Pt. very fatigued) Orientation Level: Oriented X4 Sensation/Coordination Sensation Light Touch: Not tested (Pt.  indicates no change from baseline) Coordination Gross Motor Movements are Fluid and Coordinated: No Fine Motor Movements are Fluid and Coordinated: No Coordination and Movement Description: Pt. with dysmetria/ataxia, and significant weakness throughout bil. UEs Extremity Assessment RUE Assessment RUE Assessment: Exceptions to [facility] RUE AROM (degrees) Overall AROM Right Upper  Extremity: Deficits RUE Overall AROM Comments: Pt. is Rt. hand dominant.  Shoulder 2-/5; elbow 3+/5; wrist 2/5; hand mass grasp/release with dysmetria noted LUE Assessment LUE Assessment: Exceptions to WFL LUE AROM (degrees) Overall AROM Left Upper Extremity: Deficits LUE Overall AROM Comments: Pt. is rt. hand dominant.  Pt. demonstrates strength shoulder 1+/5; elbow flex/ext 2/5; wrist 2-/5; hand mass grasp/release with dysmetria LUE Strength LUE Overall Strength: Deficits Mobility  Bed Mobility Bed Mobility: Yes Rolling Right: 2: Max assist Rolling Right Details (indicate cue type and reason): Pt. requires max A here in hospital due to placement of bed rails.  Pt. unable to perform horizontal abduction at 90 to reach upper rail, and lower rail to low for pt. to access with oposite UE to roll Rolling Left: 2: Max assist;With rail Rolling Left Details (indicate cue type and reason): See rolling Rt. details Exercises   End of Session OT - End of Session Activity Tolerance: Patient limited by fatigue Patient left: in bed;with call bell in reach General Behavior During Session: [facility] for tasks performed Cognition: Impaired, at baseline   Matsuko Kretz M 01/01/2011, 11:36 AM

## 2011-01-01 NOTE — Progress Notes (Signed)
[facility] checked; thus far- no bed offers on patient. He is being "considered" by [facility]. Social Worker will continue to check for possible offers. Met with patient and notified of above.   Darylene Price, BSW, 01/01/2011 8:44 AM

## 2011-01-02 LAB — GLUCOSE, CAPILLARY: Glucose-Capillary: 78 mg/dL (ref 70–99)

## 2011-01-02 LAB — BASIC METABOLIC PANEL
Calcium: 9.3 mg/dL (ref 8.4–10.5)
GFR calc non Af Amer: >90 mL/min (ref >90)
Potassium: 4.3 mEq/L (ref 3.5–5.1)
Sodium: 135 mEq/L (ref 135–145)

## 2011-01-02 NOTE — Progress Notes (Signed)
S: patient has no complaints, seems to be more alert. He was able to tell me a bit about his conversation with social worker yesterday. He would prefer to return home and I explained that he would not be able to have ongoing home aide visits which makes going home a non-viable option. I told him that there would likely be a delay through the holidays before we could find him a SNF.  O:  Filed Vitals:   01/02/11 0500  BP: 99/66  Pulse: 78  Temp: 97.9 F (36.6 C)  Resp: 20  General: tall young man resting in bed HEENT: PERRL, EOMI, no scleral icterus Cardiac: RRR, no rubs, murmurs or gallops Pulm: mild crackles heard on lateral-posterior lungs, moving normal volumes of air Abd: soft, nontender, nondistended, BS present Ext: warm and well perfused, no pedal edema Neuro: alert and oriented X3, cranial nerves II-XII grossly intact  A/P: Spoke to social worker Lupita Leash and she notes that patient is being considered for one SNF but that it will likely be a difficult placement since he is so young and doesn't have extensive insurance. Also may be delayed due to holiday closings at [facility].

## 2011-01-03 LAB — GLUCOSE, CAPILLARY: Glucose-Capillary: 94 mg/dL (ref 70–99)

## 2011-01-03 NOTE — Progress Notes (Signed)
Clinical social worker received a phone call that pt and his wife wanted to speak with CSW to address discharge plans. CSW met with pt and his wife, who shared that he would like to return home with home health services. CSW will follow up with RNCM to arrange home health services at discharge. CSW will continue to follow regardindg discharge planning.   Dede Query, MSW, [facility] [phone]

## 2011-01-03 NOTE — Progress Notes (Signed)
Nutrition Follow-up  Diet Order:  Dysphagia 3 PO intake mostly 100%, patient had open and partially drank Ensure in room at bedside.   Meds: Scheduled Meds:   . baclofen  10 mg Oral TID  . cholecalciferol  1,000 Units Oral Daily  . enoxaparin  40 mg Subcutaneous Q24H  . feeding supplement  237 mL Oral BID BM  . FLUoxetine  20 mg Oral Daily  . interferon beta-1b  0.3 mg Subcutaneous QODAY  . omega-3 acid ethyl esters  1 g Oral Daily  . senna-docusate  2 tablet Oral QHS  . sulfamethoxazole-trimethoprim  1 tablet Oral Q12H  . vitamin B-12  2,000 mcg Oral Daily   Continuous Infusions:  PRN Meds:.bisacodyl, morphine injection  Labs:  CMP     Component Value Date/Time   NA 135 01/02/2011 1030   K 4.3 01/02/2011 1030   CL 102 01/02/2011 1030   CO2 17* 01/02/2011 1030   GLUCOSE 101* 01/02/2011 1030   BUN 8 01/02/2011 1030   CREATININE 0.81 01/02/2011 1030   CALCIUM 9.3 01/02/2011 1030   PROT 5.7* 12/29/2010 1140   ALBUMIN 2.9* 12/29/2010 1140   AST 19 12/29/2010 1140   ALT 27 12/29/2010 1140   ALKPHOS 76 12/29/2010 1140   BILITOT 0.2* 12/29/2010 1140   GFRNONAA >90 01/02/2011 1030   GFRAA >90 01/02/2011 1030     Intake/Output Summary (Last 24 hours) at 01/03/11 1203 Last data filed at 01/03/11 0700  Gross per 24 hour  Intake    390 ml  Output    650 ml  Net   -260 ml   RD unable to speak with patient, he was resting with eyes closed and did not respond to his name. Patient had Ensure at bedside, appeared to be about half gone. Patient PO have improved. Patient still has stage II sacral pressure ulcer causing increased needs.  Weight Status:  No new weight for patient  Nutrition Dx:  Increased nutrient needs, ongoing  Goal:  PO intake to meet 90-100% of needs likely being met.  New goal: maintain >90% po intake for remainder of admission.   Intervention:  Continue with Ensure  Monitor:  Weights, labs, I/O's, PO intake   Rudean Haskell Pager #:   [phone]

## 2011-01-03 NOTE — Progress Notes (Signed)
PT NOTE: 01/03/2011   Received order for PT to establish and perform stretching program.   PT evaluated pt on 12/29/10 and found no acute PT needs. Spoke with evaluating PT today about new orders.  Evaluating PT reports pt's current condition is chronic.  Wife performs stretching at home prior to admission.  Acute PT intervention unlikely to improve pt's status.  Acute PT will not be following pt.  Suggesting PT follow-up in SNF to address pt's ROM and provide pt with any appropriate orthosis.  Acute PT signing off.    Ellijah Leffel L. Dameion Briles DPT [phone].

## 2011-01-03 NOTE — Progress Notes (Signed)
Clinical social worker contacted possible skilled nursing facilities that responded as considering patient. Due to patient insurance, and nursing facilities limited staff, pt does not have any actual bed offers at this time. The facilities will be able to respond more accurately tomorrow. CSW continuing to follow to assist with pt dc plans.   Catha Gosselin, [place] [facility]  [phone] .01/03/2011 14:45pm

## 2011-01-03 NOTE — Progress Notes (Signed)
Internal Medicine Teaching Service Attending Note Date: 01/03/2011  Patient name: Terry Palmer  Medical record number: [id number]  Date of birth: Sep 13, 1975    This patient has been seen and discussed with the house staff. Please see their note for complete details. I concur with their findings with the following additions/corrections:  PT has not been seeing patient regularly so we will see if we can get that started today. Otherwise awaiting placement, at least short term.  Tamel Abel E 01/03/2011, 11:01 AM

## 2011-01-03 NOTE — Progress Notes (Signed)
S: patient has no complaints, seems to be more alert. He would prefer to return home and I explained that he would not be able to have ongoing home aide visits which makes going home a non-viable option. I told him that there would likely be a delay through the holidays before we could find him a SNF. He is having some leg cramping which is helped by having the nurses help him stretch his legs.  O:  Filed Vitals:   01/03/11 0427  BP: 104/71  Pulse: 98  Temp: 98 F (36.7 C)  Resp: 16  General: tall young man resting in bed HEENT: PERRL, EOMI, no scleral icterus Cardiac: RRR, no rubs, murmurs or gallops Pulm: mild crackles heard on lateral-posterior lungs, moving normal volumes of air Abd: soft, nontender, nondistended, BS present Ext: warm and well perfused, no pedal edema Neuro: alert and oriented X3, cranial nerves II-XII grossly intact  A/P: will await information from social work about placement at [facility]

## 2011-01-04 LAB — GLUCOSE, CAPILLARY: Glucose-Capillary: 97 mg/dL (ref 70–99)

## 2011-01-04 NOTE — Progress Notes (Signed)
IV removed, site unremarkable.  Discharge instructions and follow-up appointment given. Pt has no new medications or prescriptions.  All questions answered and all belongings sent with pt.  Pt traveled home by [facility].  Pt's family at bedside for discharge instructions.

## 2011-01-04 NOTE — Progress Notes (Signed)
Covering Clinical Child psychotherapist (CSW) spoke with pt to discuss disposition as initial plan was for pt to dc to a SNF however this CSW observed that pt informed covering CSW yesterday that pt would like to go home with [facility]. Pt confirmed that the he and spouse have decided for pt to return home with [facility]. CSW has contacted CM of updated information.  CSW is signing off. Theresia Bough, MSW, Theresia Majors [phone]

## 2011-01-04 NOTE — Progress Notes (Signed)
Met with pt re d/c needs, pt states that he has used [place] in the past and wishes to use that agency again. HH needs very vague, noted PT eval not recommending acute PT needs, has ROM needs only. Will ask Genevieve Norlander to do in home eval and assessment after d/c to determine appropriate care that may be available for this pt.  Annamarie Major Davi Rotan RN MPH Case Manager [phone]     CARE MANAGEMENT NOTE 01/04/2011  Patient:  Terry Palmer, Terry Palmer   Account Number:  [id number]  Date Initiated:  12/27/2010  Documentation initiated by:  Ronny Flurry  Subjective/Objective Assessment:   DX:  Weakness     Action/Plan:   discharge planning  Pt wishes to use [facility] for [facility] needs will ask [facility] to provide [facility] eval and assessment   Anticipated DC Date:  01/04/2011   Anticipated DC Plan:  HOME W HOME HEALTH SERVICES  In-house referral  Clinical Social Worker      DC Planning Services  CM consult      [facility] Choice  HOME HEALTH   Choice offered to / List presented to:  C-1 Patient        HH arranged  HH-1 RN      [facility] agency  [facility]   Status of service:  Completed, signed off Medicare Important Message given?  NO (If response is "NO", the following Medicare IM given date fields will be blank) Date Medicare IM given:   Date Additional Medicare IM given:    Discharge Disposition:  HOME W HOME HEALTH SERVICES  Per UR Regulation:  Reviewed for med. 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  He lives at home with his wife and children. 01/04/2011 Met with pt re HH needs, pt insurance will not provide a HH aide, however pt has used [place] [facility] for [facility] previously and this CM will ask Genevieve Norlander to provide [facility] for eval and assessment in the home for any services which pt may qualify for at this time. CRoyal RN MPH Case Production designer, theatre/television/film..........[phone] 12/31/10 0900--This NCM spoke with spouse, [name] at [phone])  about HH vs SNF.  [name] decided that she wanted pt. to go to SNF due to her not being able to adequately care for him along with trying to take care of their children.  TC to Josi, CSW [phone]) to make referral for new SNF placement.  NCM will continue to follow for any further discharge needs.  Tera Mater, RN, BSN Case Manager [phone]   12/30/10 1111--New orders for pt. for [facility] aide.  Spouse not at bedside yet this am.  Will await spouse to visit to speak with her about [facility] choice.  Tera Mater, RN, BSN Case Manager # [phone]

## 2011-01-04 NOTE — Progress Notes (Signed)
Covering Clinical Child psychotherapist (CSW) was informed by RN that pt will need a non emergency ambulance home at 3:30pm. CSW contacted [facility] for transportation and placed [facility] form with pt face sheet inside pt wall-a-roo. NO additional needs requested. CSW is signing off.  Theresia Bough, MSW, Theresia Majors [phone]

## 2011-01-31 ENCOUNTER — Encounter (HOSPITAL_COMMUNITY): Payer: Self-pay | Admitting: *Deleted

## 2011-01-31 ENCOUNTER — Inpatient Hospital Stay (HOSPITAL_COMMUNITY)
Admission: EM | Admit: 2011-01-31 | Discharge: 2011-02-05 | DRG: 689 | Disposition: A | Discharge status: Skilled Nursing Facility | Payer: Medicare HMO | Attending: Internal Medicine | Admitting: Internal Medicine

## 2011-01-31 DIAGNOSIS — N390 Urinary tract infection, site not specified: Principal | ICD-10-CM | POA: Diagnosis present

## 2011-01-31 DIAGNOSIS — E876 Hypokalemia: Secondary | ICD-10-CM | POA: Diagnosis present

## 2011-01-31 DIAGNOSIS — R5383 Other fatigue: Secondary | ICD-10-CM | POA: Diagnosis present

## 2011-01-31 DIAGNOSIS — Z87440 Personal history of urinary (tract) infections: Secondary | ICD-10-CM

## 2011-01-31 DIAGNOSIS — R5381 Other malaise: Secondary | ICD-10-CM | POA: Diagnosis present

## 2011-01-31 DIAGNOSIS — B964 Proteus (mirabilis) (morganii) as the cause of diseases classified elsewhere: Secondary | ICD-10-CM | POA: Diagnosis present

## 2011-01-31 DIAGNOSIS — G35 Multiple sclerosis: Secondary | ICD-10-CM | POA: Diagnosis present

## 2011-01-31 DIAGNOSIS — R531 Weakness: Secondary | ICD-10-CM | POA: Diagnosis present

## 2011-01-31 DIAGNOSIS — G8250 Quadriplegia, unspecified: Secondary | ICD-10-CM | POA: Diagnosis present

## 2011-01-31 DIAGNOSIS — D72829 Elevated white blood cell count, unspecified: Secondary | ICD-10-CM | POA: Diagnosis present

## 2011-01-31 DIAGNOSIS — Z7401 Bed confinement status: Secondary | ICD-10-CM

## 2011-01-31 LAB — URINE MICROSCOPIC-ADD ON

## 2011-01-31 LAB — CBC
MCH: 29 pg (ref 26.0–34.0)
MCHC: 34.9 g/dL (ref 30.0–36.0)
MCV: 83.2 fL (ref 78.0–100.0)
Platelets: 340 10*3/uL (ref 150–400)
RDW: 14.2 % (ref 11.5–15.5)

## 2011-01-31 LAB — DIFFERENTIAL
Basophils Absolute: 0 10*3/uL (ref 0.0–0.1)
Basophils Relative: 0 % (ref 0–1)
Eosinophils Absolute: 0 10*3/uL (ref 0.0–0.7)
Eosinophils Relative: 0 % (ref 0–5)
Neutrophils Relative %: 82 % — ABNORMAL HIGH (ref 43–77)

## 2011-01-31 LAB — POCT I-STAT, CHEM 8
BUN: 12 mg/dL (ref 6–23)
Hemoglobin: 16 g/dL (ref 13.0–17.0)
Potassium: 3.8 mEq/L (ref 3.5–5.1)
Sodium: 138 mEq/L (ref 135–145)
TCO2: 18 mmol/L (ref 0–100)

## 2011-01-31 LAB — URINALYSIS, ROUTINE W REFLEX MICROSCOPIC
Glucose, UA: NEGATIVE mg/dL
Ketones, ur: NEGATIVE mg/dL
Nitrite: POSITIVE — AB
Protein, ur: >300 mg/dL — AB
Urobilinogen, UA: 0.2 mg/dL (ref 0.0–1.0)

## 2011-01-31 NOTE — ED Provider Notes (Signed)
History     CSN: [id number]  Arrival date & time 01/31/11  2124   First MD Initiated Contact with Patient 01/31/11 2143      Chief Complaint  Patient presents with  . Fever     HPI  History provided by the patient's father. Patient is a 36 year old male with advanced MS who is currently unable to communicate presents with concerns for fever for the past 2-3 days. Patient has daily home nursing. Recently there was some concern that patient may be having a UTI. His urine was collected by the home nurse and sent off for testing. Results were concerning for a UTI and patient had antibiotics called in. Per patient's father he has been unable to take this medication. Patient has had in general poor by mouth intake and is not swallowing well. There have been no other reports for other symptoms.    Past Medical History  Diagnosis Date  . MS (multiple sclerosis)   . UTI (urinary tract infection)   . Childhood asthma     No past surgical history on file.  Family History  Problem Relation Age of Onset  . Asthma Mother     History  Substance Use Topics  . Smoking status: Former Games developer  . Smokeless tobacco: Former Neurosurgeon  . Alcohol Use: No      Review of Systems  Unable to perform ROS Constitutional: Positive for fever. Negative for chills.  Genitourinary: Negative for hematuria.   level V caveat applies secondary to advanced MS  Allergies  Review of patient's allergies indicates no known allergies.  Home Medications   Current Outpatient Rx  Name Route Sig Dispense Refill  . BACLOFEN 10 MG PO TABS Oral Take 10 mg by mouth 3 (three) times daily.     Marland Kitchen BISACODYL 5 MG PO TBEC Oral Take 10 mg by mouth daily as needed. For constipation     . VITAMIN D 1000 UNITS PO TABS Oral Take 1,000 Units by mouth daily.      . OMEGA-3 FATTY ACIDS 1000 MG PO CAPS Oral Take 1,200 mg by mouth daily.      Marland Kitchen FLUOXETINE HCL 20 MG PO CAPS Oral Take 20 mg by mouth daily.     Marland Kitchen GARLIC 1000 MG PO  CAPS Oral Take 1,000 mg by mouth daily.      . INTERFERON BETA-1B 0.3 MG [place] SOLR Subcutaneous Inject 1 mg into the skin every other day.     Marland Kitchen VITAMIN B-12 1000 MCG PO TABS Oral Take 2,000 mcg by mouth daily.        BP 116/78  Pulse 110  Resp 16  SpO2 97%  Physical Exam  Nursing note and vitals reviewed. Constitutional: He appears well-developed and well-nourished.  HENT:  Head: Normocephalic and atraumatic.  Cardiovascular: Normal rate and regular rhythm.   Pulmonary/Chest: Effort normal and breath sounds normal. No respiratory distress. He has no wheezes.  Abdominal: Soft. He exhibits no distension. There is no tenderness.  Genitourinary: Penis normal.       Foley catheter in place. Urine dark and cloudy with sediment.  Neurological:       Awake. Nonverbal. Neurological function at baseline per father.  Skin: Skin is warm. No rash noted.    ED Course  Procedures  Labs Reviewed  CBC - Abnormal; Notable for the following:    WBC 14.2 (*)    All other components within normal limits  DIFFERENTIAL - Abnormal; Notable for the following:  Neutrophils Relative 82 (*)    Neutro Abs 11.7 (*)    Lymphocytes Relative 10 (*)    All other components within normal limits  URINALYSIS, ROUTINE W REFLEX MICROSCOPIC - Abnormal; Notable for the following:    APPearance TURBID (*)    pH 8.5 (*)    Hgb urine dipstick SMALL (*)    Protein, ur >300 (*)    Nitrite POSITIVE (*)    Leukocytes, UA LARGE (*)    All other components within normal limits  POCT I-STAT, CHEM 8 - Abnormal; Notable for the following:    Glucose, Bld 137 (*)    Calcium, Ion 1.07 (*)    All other components within normal limits  URINE MICROSCOPIC-ADD ON - Abnormal; Notable for the following:    Bacteria, UA MANY (*)    Crystals CA OXALATE CRYSTALS (*) TRIPLE PHOSPHATE CRYSTALS   All other components within normal limits  URINE CULTURE  I-STAT, CHEM 8    Results for orders placed during the hospital encounter  of 01/31/11  CBC      Component Value Range   WBC 14.2 (*) 4.0 - 10.5 (K/uL)   RBC 5.24  4.22 - 5.81 (MIL/uL)   Hemoglobin 15.2  13.0 - 17.0 (g/dL)   HCT 16.1  09.6 - 04.5 (%)   MCV 83.2  78.0 - 100.0 (fL)   MCH 29.0  26.0 - 34.0 (pg)   MCHC 34.9  30.0 - 36.0 (g/dL)   RDW 40.9  81.1 - 91.4 (%)   Platelets 340  150 - 400 (K/uL)  DIFFERENTIAL      Component Value Range   Neutrophils Relative 82 (*) 43 - 77 (%)   Neutro Abs 11.7 (*) 1.7 - 7.7 (K/uL)   Lymphocytes Relative 10 (*) 12 - 46 (%)   Lymphs Abs 1.5  0.7 - 4.0 (K/uL)   Monocytes Relative 7  3 - 12 (%)   Monocytes Absolute 1.0  0.1 - 1.0 (K/uL)   Eosinophils Relative 0  0 - 5 (%)   Eosinophils Absolute 0.0  0.0 - 0.7 (K/uL)   Basophils Relative 0  0 - 1 (%)   Basophils Absolute 0.0  0.0 - 0.1 (K/uL)  URINALYSIS, ROUTINE W REFLEX MICROSCOPIC      Component Value Range   Color, Urine YELLOW  YELLOW    APPearance TURBID (*) CLEAR    Specific Gravity, Urine 1.016  1.005 - 1.030    pH 8.5 (*) 5.0 - 8.0    Glucose, UA NEGATIVE  NEGATIVE (mg/dL)   Hgb urine dipstick SMALL (*) NEGATIVE    Bilirubin Urine NEGATIVE  NEGATIVE    Ketones, ur NEGATIVE  NEGATIVE (mg/dL)   Protein, ur >782 (*) NEGATIVE (mg/dL)   Urobilinogen, UA 0.2  0.0 - 1.0 (mg/dL)   Nitrite POSITIVE (*) NEGATIVE    Leukocytes, UA LARGE (*) NEGATIVE   POCT I-STAT, CHEM 8      Component Value Range   Sodium 138  135 - 145 (mEq/L)   Potassium 3.8  3.5 - 5.1 (mEq/L)   Chloride 109  96 - 112 (mEq/L)   BUN 12  6 - 23 (mg/dL)   Creatinine, Ser 9.56  0.50 - 1.35 (mg/dL)   Glucose, Bld 213 (*) 70 - 99 (mg/dL)   Calcium, Ion 0.86 (*) 1.12 - 1.32 (mmol/L)   TCO2 18  0 - 100 (mmol/L)   Hemoglobin 16.0  13.0 - 17.0 (g/dL)   HCT 57.8  46.9 - 62.9 (%)  URINE MICROSCOPIC-ADD ON      Component Value Range   WBC, UA TOO NUMEROUS TO COUNT  <3 (WBC/hpf)   RBC / HPF 7-10  <3 (RBC/hpf)   Bacteria, UA MANY (*) RARE    Crystals CA OXALATE CRYSTALS (*) NEGATIVE    Urine-Other  MUCOUS PRESENT        1. UTI (urinary tract infection)       MDM  9:45 PM patient seen and evaluated. Patient in no acute distress.  Patient continues to be stable. Pt was discussed with attending physician. Will start antibiotics for UTI and plan for admission.  Spoke with [facility] hospitalists. They will see patient and admit to team 4 general med surge bed.    Angus Seller, [place] 02/01/11 [phone]

## 2011-01-31 NOTE — ED Notes (Signed)
Pt has advanced MS. Developed a fever for past three days. Pt from home. Father thinks pt has a UTI. Pt has an indwelling catheter in his bladder. Urine is cloudy, concentrated with sediment in it. Pt is mostly nonverbal. Will follow simple commands. Pt is mostly immobile but with intermittent seizure activity.

## 2011-01-31 NOTE — ED Notes (Signed)
Pt has advanced MS and has an indwelling cathter that has cloudy, concentrated urine with sediment. Pt has been febrile for 3 days. Pt is noncommunicating at this time.

## 2011-02-01 ENCOUNTER — Encounter (HOSPITAL_COMMUNITY): Payer: Self-pay | Admitting: Internal Medicine

## 2011-02-01 DIAGNOSIS — R531 Weakness: Secondary | ICD-10-CM | POA: Diagnosis present

## 2011-02-01 DIAGNOSIS — D72829 Elevated white blood cell count, unspecified: Secondary | ICD-10-CM | POA: Diagnosis present

## 2011-02-01 LAB — BASIC METABOLIC PANEL
CO2: 22 mEq/L (ref 19–32)
Calcium: 9.3 mg/dL (ref 8.4–10.5)
Creatinine, Ser: 1.27 mg/dL (ref 0.50–1.35)
Glucose, Bld: 114 mg/dL — ABNORMAL HIGH (ref 70–99)

## 2011-02-01 LAB — CBC
Hemoglobin: 13.5 g/dL (ref 13.0–17.0)
MCH: 29 pg (ref 26.0–34.0)
MCV: 83.9 fL (ref 78.0–100.0)
Platelets: 309 10*3/uL (ref 150–400)
RBC: 4.65 MIL/uL (ref 4.22–5.81)

## 2011-02-01 LAB — GLUCOSE, CAPILLARY: Glucose-Capillary: 97 mg/dL (ref 70–99)

## 2011-02-01 MED ORDER — DEXTROSE 5 % IV SOLN
1.0000 g | Freq: Every day | INTRAVENOUS | Status: DC
  Administered 2011-02-01: 1 g via INTRAVENOUS
  Filled 2011-02-01 (×2): qty 10

## 2011-02-01 MED ORDER — DEXTROSE 5 % IV SOLN: 1.0000 g | INTRAVENOUS | Status: DC

## 2011-02-01 MED ORDER — SODIUM CHLORIDE 0.9 % IV SOLN
INTRAVENOUS | Status: DC
  Administered 2011-02-01 – 2011-02-02 (×5): via INTRAVENOUS
  Administered 2011-02-04: 20 mL/h via INTRAVENOUS

## 2011-02-01 MED ORDER — BACLOFEN 10 MG PO TABS
10.0000 mg | ORAL_TABLET | Freq: Three times a day (TID) | ORAL | Status: DC
  Administered 2011-02-01 – 2011-02-05 (×14): 10 mg via ORAL
  Filled 2011-02-01 (×18): qty 1

## 2011-02-01 MED ORDER — SENNA 8.6 MG PO TABS
1.0000 | ORAL_TABLET | Freq: Two times a day (BID) | ORAL | Status: DC
  Administered 2011-02-01 – 2011-02-05 (×8): 8.6 mg via ORAL
  Filled 2011-02-01 (×7): qty 1

## 2011-02-01 MED ORDER — INTERFERON BETA-1B 0.3 MG ~~LOC~~ SOLR
1.0000 mg | SUBCUTANEOUS | Status: DC
  Administered 2011-02-01 – 2011-02-03 (×2): 1 mg via SUBCUTANEOUS

## 2011-02-01 MED ORDER — ACETAMINOPHEN 650 MG RE SUPP: 650.0000 mg | Freq: Four times a day (QID) | RECTAL | Status: DC | PRN

## 2011-02-01 MED ORDER — DOCUSATE SODIUM 100 MG PO CAPS
100.0000 mg | ORAL_CAPSULE | Freq: Two times a day (BID) | ORAL | Status: DC
  Administered 2011-02-01 – 2011-02-05 (×9): 100 mg via ORAL
  Filled 2011-02-01 (×12): qty 1

## 2011-02-01 MED ORDER — HEPARIN SODIUM (PORCINE) 5000 UNIT/ML IJ SOLN
5000.0000 [IU] | Freq: Three times a day (TID) | INTRAMUSCULAR | Status: DC
  Administered 2011-02-01 – 2011-02-05 (×14): 5000 [IU] via SUBCUTANEOUS
  Filled 2011-02-01 (×19): qty 1

## 2011-02-01 MED ORDER — DEXTROSE 5 % IV SOLN
1.0000 g | Freq: Once | INTRAVENOUS | Status: AC
  Administered 2011-02-01: 1 g via INTRAVENOUS
  Filled 2011-02-01: qty 10

## 2011-02-01 MED ORDER — SODIUM CHLORIDE 0.9 % IV BOLUS (SEPSIS)
1000.0000 mL | Freq: Once | INTRAVENOUS | Status: AC
  Administered 2011-02-01: 1000 mL via INTRAVENOUS

## 2011-02-01 MED ORDER — FLUOXETINE HCL 20 MG PO CAPS
20.0000 mg | ORAL_CAPSULE | Freq: Every day | ORAL | Status: DC
  Administered 2011-02-01 – 2011-02-05 (×5): 20 mg via ORAL
  Filled 2011-02-01 (×6): qty 1

## 2011-02-01 MED ORDER — ACETAMINOPHEN 325 MG PO TABS: 650.0000 mg | ORAL_TABLET | Freq: Four times a day (QID) | ORAL | Status: DC | PRN

## 2011-02-01 NOTE — ED Notes (Signed)
Rufino Staup (cell) [phone]; (home) [phone],  Ocie Tino (cell) [phone] (home) [phone]

## 2011-02-01 NOTE — ED Provider Notes (Signed)
Medical screening examination/treatment/procedure(s) were performed by non-physician practitioner and as supervising physician I was immediately available for consultation/collaboration.   Gwyneth Sprout, MD 02/01/11 [phone]

## 2011-02-01 NOTE — Progress Notes (Signed)
Subjective: Feeling slightly better today.  Objective: Vital signs in last 24 hours: Filed Vitals:   02/01/11 0108 02/01/11 0126 02/01/11 0541 02/01/11 1406  BP: 121/77 118/83 114/76 112/75  Pulse: 96 83 88 84  Temp:  99.1 F (37.3 C) 98.1 F (36.7 C) 98.2 F (36.8 C)  TempSrc:  Oral Oral Oral  Resp:  16 16 16   Height:      Weight:  70.7 kg (155 lb 13.8 oz)    SpO2: 99% 99% 98% 99%   Weight change:   Intake/Output Summary (Last 24 hours) at 02/01/11 1735 Last data filed at 02/01/11 1300  Gross per 24 hour  Intake    465 ml  Output   1100 ml  Net   -635 ml    Physical Exam: General: Awake, Oriented, No acute distress. HEENT: EOMI. Neck: Supple CV: S1 and S2 Lungs: Clear to ascultation bilaterally Abdomen: Soft, Nontender, Nondistended, +bowel sounds. Ext: Good pulses. Trace edema. GU: Foley catheter in place.  Still pus noted from catheter insertion site.  Lab Results:  Basename 02/01/11 0322 01/31/11 2215  NA 137 138  K 3.9 3.8  CL 103 109  CO2 22 --  GLUCOSE 114* 137*  BUN 13 12  CREATININE 1.27 1.10  CALCIUM 9.3 --  MG -- --  PHOS -- --   No results found for this basename: AST:2,ALT:2,ALKPHOS:2,BILITOT:2,PROT:2,ALBUMIN:2 in the last 72 hours No results found for this basename: LIPASE:2,AMYLASE:2 in the last 72 hours  Basename 02/01/11 0322 01/31/11 2215 01/31/11 2210  WBC 15.7* -- 14.2*  NEUTROABS -- -- 11.7*  HGB 13.5 16.0 --  HCT 39.0 47.0 --  MCV 83.9 -- 83.2  PLT 309 -- 340   No results found for this basename: CKTOTAL:3,CKMB:3,CKMBINDEX:3,TROPONINI:3 in the last 72 hours No components found with this basename: POCBNP:3 No results found for this basename: DDIMER:2 in the last 72 hours No results found for this basename: HGBA1C:2 in the last 72 hours No results found for this basename: CHOL:2,HDL:2,LDLCALC:2,TRIG:2,CHOLHDL:2,LDLDIRECT:2 in the last 72 hours No results found for this basename: TSH,T4TOTAL,FREET3,T3FREE,THYROIDAB in the last 72  hours No results found for this basename: VITAMINB12:2,FOLATE:2,FERRITIN:2,TIBC:2,IRON:2,RETICCTPCT:2 in the last 72 hours  Micro Results: Recent Results (from the past 240 hour(s))  URINE CULTURE     Status: Normal (Preliminary result)   Collection Time   01/31/11 10:08 PM      Component Value Range Status Comment   Specimen Description URINE, RANDOM   Final    Special Requests NONE   Final    Setup Time [id number]   Final    Colony Count >=100,000 COLONIES/ML   Final    Culture GRAM NEGATIVE RODS   Final    Report Status PENDING   Incomplete     Studies/Results: No results found.  Medications: I have reviewed the patient's current medications. Scheduled Meds:   . baclofen  10 mg Oral TID  . cefTRIAXone (ROCEPHIN)  IV  1 g Intravenous Once  . cefTRIAXone (ROCEPHIN)  IV  1 g Intravenous QHS  . docusate sodium  100 mg Oral BID  . FLUoxetine  20 mg Oral Daily  . heparin  5,000 Units Subcutaneous Q8H  . interferon beta-1b  1 mg Subcutaneous QODAY  . senna  1 tablet Oral BID  . sodium chloride  1,000 mL Intravenous Once  . DISCONTD: cefTRIAXone (ROCEPHIN)  IV  1 g Intravenous Q24H   Continuous Infusions:   . sodium chloride 100 mL/hr at 02/01/11 1139   PRN  Meds:.acetaminophen, acetaminophen  Assessment/Plan: 1.  Urinary tract infection with leukocytosis.  Continue ceftriaxone.  Antibiotics since 02/01/2011.  Foley catheter changed in the emergency department.  Urine culture pending.  2.  History of multiple sclerosis.  Patient was evaluated by speech therapy, swelling at baseline.  Continue regular diet.  Stable at this time continue home medications including interferon and baclofen.  3.  Leukocytosis.  Likely due to #1.  4. Weakness generalized.  Will have physical therapy evaluate the patient.  5.  History of depression.  Continue fluoxetine.  6.  Prophylaxis.  Subcutaneous heparin.  7.  Disposition.  Pending.   LOS: 1 day  Haroon Shatto A, MD 02/01/2011, 5:35  PM

## 2011-02-01 NOTE — ED Notes (Signed)
Removed old foley. Pus coming out of penis. Peri care  performed. New Foley catheter 14Fr inserted using sterile technique.

## 2011-02-01 NOTE — Progress Notes (Signed)
INITIAL ADULT NUTRITION ASSESSMENT Date: 02/01/2011   Time: 10:52 AM Reason for Assessment: Low braden  ASSESSMENT: Male 36 y.o.  Dx: Fevers, cloudy urine output in foley  Hx:  Past Medical History  Diagnosis Date  . MS (multiple sclerosis)   . UTI (urinary tract infection)   . Childhood asthma    Related Meds:  Scheduled Meds:   . baclofen  10 mg Oral TID  . cefTRIAXone (ROCEPHIN)  IV  1 g Intravenous Once  . cefTRIAXone (ROCEPHIN)  IV  1 g Intravenous QHS  . docusate sodium  100 mg Oral BID  . FLUoxetine  20 mg Oral Daily  . heparin  5,000 Units Subcutaneous Q8H  . interferon beta-1b  1 mg Subcutaneous QODAY  . senna  1 tablet Oral BID  . sodium chloride  1,000 mL Intravenous Once  . DISCONTD: cefTRIAXone (ROCEPHIN)  IV  1 g Intravenous Q24H   Continuous Infusions:   . sodium chloride 100 mL/hr at 02/01/11 0145   PRN Meds:.acetaminophen, acetaminophen  Ht: 6' (182.9 cm)  Wt: 155 lb 13.8 oz (70.7 kg)  Ideal Wt: 80.9kg % Ideal Wt: 87  Usual Wt: 75.7kg on 12/27/10 admission at [facility] % Usual Wt: 93  Body mass index is 21.14 kg/(m^2).  Food/Nutrition Related Hx: Pt reports poor appetite for a long time. Noted pt has lost 11 pounds in the past month since December 2012 admission at [facility]. Pt unsure of usual weight before then. Pt states he drinks Ensure at home. Pt seen by SLP today who noted that pt has been requiring assistance to eat recently r/t pt not feeling well, otherwise pt without signs/symptoms of aspiration during evaluation.   Labs:  CMP     Component Value Date/Time   NA 140 02/02/2011 0325   K 3.3* 02/02/2011 0325   CL 109 02/02/2011 0325   CO2 22 02/02/2011 0325   GLUCOSE 89 02/02/2011 0325   BUN 8 02/02/2011 0325   CREATININE 0.80 02/02/2011 0325   CALCIUM 8.6 02/02/2011 0325   PROT 5.7* 12/29/2010 1140   ALBUMIN 2.9* 12/29/2010 1140   AST 19 12/29/2010 1140   ALT 27 12/29/2010 1140   ALKPHOS 76 12/29/2010 1140   BILITOT 0.2*  12/29/2010 1140   GFRNONAA >90 02/02/2011 0325   GFRAA >90 02/02/2011 0325    Diet Order: General  IVF:    sodium chloride Last Rate: 100 mL/hr at 02/01/11 0145    Estimated Nutritional Needs:   Kcal:1700-2000 Protein:85-105g Fluid:1.7-2L  NUTRITION DIAGNOSIS: -Inadequate oral intake (NI-2.1).  Status: Ongoing -Pt meets criteria for severe PCM of acute illness AEB 6.6% weight loss in the past month and <75% energy intake r/t poor appetite per pt statement  RELATED TO: poor appetite  AS EVIDENCE BY: pt statement  MONITORING/EVALUATION(Goals): Pt to consume >75% of meals/supplements.   EDUCATION NEEDS: -No education needs identified at this time  INTERVENTION: Ensure Clinical Strength TID. Encouraged increased intake.   Dietitian #: [phone]  DOCUMENTATION CODES Per approved criteria  -Severe malnutrition in the context of acute illness or injury    Marshall Cork 02/01/2011, 10:52 AM

## 2011-02-01 NOTE — ED Notes (Signed)
Called father to inform him that patient was moved to 7

## 2011-02-01 NOTE — H&P (Signed)
PCP:  Dorrene German, MD, MD   Unable to confirm this, but this is listed previously as well Per ED discussion with pt's father, he endorses pt's neurologist Dr. 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   Pt was recently admitted to IM teaching service 12/18 for weakness, with records indicating  several prior UTI's in the past few months, and poor PO intake. They felt acute on chronic  MS and started IV solumedrol x3d. Pt was given Bactrim 7d for stenotrophomonas in his urine.  Foley catheter was removed, but he didn't tolerate not having it, so it was replaced.    Pt cannot give history. There was a father here earlier, but he's now gone, so history gathered from ED staff. Pt has been febrile for past few days, and indwelling Foley outpt has been noted to be more cloudy. Family was concerned for UTI, and UA was sent off which was concerning for UTI, so outpt oral ABx were attempted however pt cannot swallow well, was unable to take PO's.   In the ED, pt was febrile to 100.9, with tachycardia up to 135, stable BP's, but most  recently p98. Labs showed HCO3 18, Ca 1.07, otherwise normal chem including renal 12/1.1.  WBC was 14.2 with 82% neutro, rest of CBC normal. UA with >300 protein, positive  nitrite/large LE, many bacteria, too numerous to count WBC's, and 7-10 RBC's. UCx pending. Pt had Foley replaced in the ED, and noted to have frank pus in the Foley and coming from penis. He was logrolled and no sacral decubitus ulcers were noted.   ROS o/w unobtainable.   Past Medical History  Diagnosis Date  . MS (multiple sclerosis)   . UTI (urinary tract infection)   .  Childhood asthma     History reviewed. No pertinent past surgical history.  Medications:  HOME MEDS:  Unable to reconcile this, but was done by pharmacy tech in ED.  Prior to Admission medications   Medication Sig Start Date End Date Taking? Authorizing Provider  baclofen (LIORESAL) 10 MG tablet Take 10 mg by mouth 3 (three) times daily.    Yes Historical Provider, MD  bisacodyl (DULCOLAX) 5 MG EC tablet Take 10 mg by mouth daily as needed. For constipation    Yes Historical Provider, MD  cholecalciferol (VITAMIN D) 1000 UNITS tablet Take 1,000 Units by mouth daily.     Yes Historical Provider, MD  fish oil-omega-3 fatty acids 1000 MG capsule Take 1,200 mg by mouth daily.     Yes Historical Provider, MD  FLUoxetine (PROZAC) 20 MG capsule Take 20 mg by mouth daily.    Yes Historical Provider, MD  Garlic 1000 MG CAPS Take 1,000 mg by mouth daily.     Yes Historical Provider, MD  interferon beta-1b (BETASERON) 0.3 MG injection Inject 1 mg into the skin every other day.    Yes Historical Provider, MD  vitamin B-12 (CYANOCOBALAMIN) 1000 MCG tablet Take 2,000 mcg by mouth daily.     Yes Historical Provider, MD   Allergies:  No Known Allergies  Social History:  Not reconciliable reports that he has quit smoking. He has quit using smokeless tobacco. He  reports that he uses illicit drugs (Marijuana and Cocaine). He reports that he does not drink alcohol. The patient currently lives at home with his wife and kids, who help him with his ADL's.  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  The patient currently has [facility], and is in the process of reapplying for Medicaid (12/2010).  Orrie Schubert (cell) [phone]; (home) [phone],   Shivaan Tierno (cell) [phone] (home) [phone]  Family History: Family History  Problem Relation Age of Onset  . Asthma Mother    Physical Exam: Filed Vitals:    01/31/11 2145 01/31/11 2346 02/01/11 0013 02/01/11 0108  BP: 150/99 123/83  121/77  Pulse: 135 98  96  Temp: 99 F (37.2 C)  100.9 F (38.3 C)   TempSrc: Rectal  Rectal   Resp:  11    Height: 6' (1.829 m)     Weight: 81.647 kg (180 lb)     SpO2: 98% 97%  99%   Blood pressure 121/77, pulse 96, temperature 100.9 F (38.3 C), temperature source Rectal, resp. rate 11, height 6' (1.829 m), weight 81.647 kg (180 lb), SpO2 99.00%. Gen: Thin, tall M laying in ED stetcher. Awakens with a start to loud voice, makes brief eye  contact, then dozes back off again, awakens again when touched. Doesn't talk, makes minimal  eye contact. Eyes glazed over, possibly moderately ill, but not sure if this is baseline for  him either.  HEENT: Pupils round, sclera/conjunctivae a bit injected but anicteric. Eyes with occasional  roving motions, and conjugate, grossly EOMI but can't assess fully, nor can I assess his  mouth. Lips moderately dry.  Lungs: Grossly clear with good air movement on the left anterolaterally (can't sit up), but  harder to assess on the right as breath sounds aren't as good.  Heart: RRR, not tachy, no gross m/g Abd: Scaphoid, thin, but soft NT ND, benign overall.  Extrem: warm, perfusing well, but thin, with decreased muscle bulk, almost cachectic. RUE  with normal tone, however LUE has somewhat increased tonicity. No clonus noted. When his  abdomen was touched, he startled and his RLE had a few rhythmic motions that settled down  quickly.  Neuro: Decreased alertness, not attentive, cannot follow commands or interact. Face grossly  symmetric. No speech. Can't assess muscle strength at all. Very limited exam.   Labs & Imaging Results for orders placed during the hospital encounter of 01/31/11 (from the past 48 hour(s))  URINALYSIS, ROUTINE W REFLEX MICROSCOPIC     Status: Abnormal   Collection Time   01/31/11 10:08 PM      Component Value Range Comment   Color, Urine YELLOW  YELLOW      APPearance TURBID (*) CLEAR     Specific Gravity, Urine 1.016  1.005 - 1.030     pH 8.5 (*) 5.0 - 8.0     Glucose, UA NEGATIVE  NEGATIVE (mg/dL)    Hgb urine dipstick SMALL (*) NEGATIVE     Bilirubin Urine NEGATIVE  NEGATIVE     Ketones, ur NEGATIVE  NEGATIVE (mg/dL)    Protein, ur >295 (*) NEGATIVE (mg/dL)    Urobilinogen, UA 0.2  0.0 - 1.0 (mg/dL)    Nitrite POSITIVE (*) NEGATIVE     Leukocytes, UA LARGE (*) NEGATIVE    URINE MICROSCOPIC-ADD ON     Status: Abnormal   Collection Time   01/31/11 10:08 PM  Component Value Range Comment   WBC, UA TOO NUMEROUS TO COUNT  <3 (WBC/hpf)    RBC / HPF 7-10  <3 (RBC/hpf)    Bacteria, UA MANY (*) RARE     Crystals CA OXALATE CRYSTALS (*) NEGATIVE  TRIPLE PHOSPHATE CRYSTALS   Urine-Other MUCOUS PRESENT     CBC     Status: Abnormal   Collection Time   01/31/11 10:10 PM      Component Value Range Comment   WBC 14.2 (*) 4.0 - 10.5 (K/uL)    RBC 5.24  4.22 - 5.81 (MIL/uL)    Hemoglobin 15.2  13.0 - 17.0 (g/dL)    HCT 16.1  09.6 - 04.5 (%)    MCV 83.2  78.0 - 100.0 (fL)    MCH 29.0  26.0 - 34.0 (pg)    MCHC 34.9  30.0 - 36.0 (g/dL)    RDW 40.9  81.1 - 91.4 (%)    Platelets 340  150 - 400 (K/uL)   DIFFERENTIAL     Status: Abnormal   Collection Time   01/31/11 10:10 PM      Component Value Range Comment   Neutrophils Relative 82 (*) 43 - 77 (%)    Neutro Abs 11.7 (*) 1.7 - 7.7 (K/uL)    Lymphocytes Relative 10 (*) 12 - 46 (%)    Lymphs Abs 1.5  0.7 - 4.0 (K/uL)    Monocytes Relative 7  3 - 12 (%)    Monocytes Absolute 1.0  0.1 - 1.0 (K/uL)    Eosinophils Relative 0  0 - 5 (%)    Eosinophils Absolute 0.0  0.0 - 0.7 (K/uL)    Basophils Relative 0  0 - 1 (%)    Basophils Absolute 0.0  0.0 - 0.1 (K/uL)   POCT I-STAT, CHEM 8     Status: Abnormal   Collection Time   01/31/11 10:15 PM      Component Value Range Comment   Sodium 138  135 - 145 (mEq/L)    Potassium 3.8  3.5 - 5.1 (mEq/L)    Chloride 109  96 - 112 (mEq/L)    BUN 12  6 -  23 (mg/dL)    Creatinine, Ser 7.82  0.50 - 1.35 (mg/dL)    Glucose, Bld 956 (*) 70 - 99 (mg/dL)    Calcium, Ion 2.13 (*) 1.12 - 1.32 (mmol/L)    TCO2 18  0 - 100 (mmol/L)    Hemoglobin 16.0  13.0 - 17.0 (g/dL)    HCT 08.6  57.8 - 46.9 (%)    No results found.  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   1. UTI, leukocytosis: There is frank pus in his Foley catheter, and this has been changed in  the ED already. UCx is currently pending, however prior cultures have shown Ecoli 10/2010  pansensitive, 10/2010 proteus without sensitivities, and 12/2010 stenotrophomonas sensitive  to bactrim/intermediation levofloxacine. Pt is now s/p Ceftraixone in the ED, which is  reasonable to continue pending culture data.   - Ceftriaxone, f/u UCx, maintenance IVF's   2. MS: continue home interferon, baclofen, prozac. Not sure at this time what his ability to  eat is, so will keep NPO and request swallowing evaluation before feeding him or giving  meds.   3. Holding all other non-essential meds  4. Social: SW consultation. Last d/c summary indicates that the patient's wife was  requesting placement but there were difficulties with this, only one place accepted him, but  then she changed her mind and took him home.   Regular bed, [facility] team 4 Presumed full code, unconfirmable   Other plans as per orders.  Copper Basnett 02/01/2011, 1:25 AM

## 2011-02-01 NOTE — Progress Notes (Signed)
Speech Language/Pathology Clinical/Bedside Swallow Evaluation Patient Details  Name: Terry Palmer MRN: [id number] DOB: 02-27-1975 Today's Date: 02/01/2011 [id number]  Past Medical History:  Past Medical History  Diagnosis Date  . MS (multiple sclerosis)   . UTI (urinary tract infection)   . Childhood asthma    Past Surgical History: History reviewed. No pertinent past surgical history. HPI:  36 year old male admitted to [facility] with fever, cloudly urine from chronic indwelling catheter - UTI, MS diagnosed 8-9 years ago, quadriparesis, bed bound.   Pt was reportedly unable to swallow his antibiotic, likely due to lethargy .  CXR 12/12 NAD, Pt with FTT 10/2010.  PMH + baclofen, ducolax, Prozac, B12.  Pt reports problems with constipation, trying to make an effort to drink more water.     Assessment/Recommendations/Treatment Plan    SLP Assessment Clinical Impression Statement: Pt presents with swallow function at baseline per his report.  No clinical s/s of aspiration with few bites of cracker, applesauce and water.  Pt does acknowledge getting choked on food/drink infrequently but does not have h/o pulmonary infections.  Pt consumes a regular/thin diet at home.  Chronic aspiration risk due to MS & decr pulmonary reserve but pt's swallow appears at baseline .  Pt will require someone to feed him due to his MS, he reports requring assitance at home in last few days to eat.  Recommend regular/thin , observing pt to assure swallow before giving more po, due to delay observed.   Risk for Aspiration: Moderate Other Related Risk Factors: History of dysphagia  Swallow Evaluation Recommendations Solid Consistency: Regular (prefer pt order soft foods) Liquid Consistency: Thin Liquid Administration via: Cup Medication Administration: Other (Comment) (per pt request)  WITH applesauce, pt agreeable Supervision: Staff feed patient Compensations: Slow rate;Small sips/bites Postural Changes and/or  Swallow Maneuvers: Seated upright 90 degrees Oral Care Recommendations: Oral care QID  Treatment Plan Treatment Plan Recommendations: No treatment recommended at this time   Individuals Consulted Consulted and Agree with Results and Recommendations: Patient   HPI: 36 year old male admitted to [facility] with fever, cloudly urine from chronic indwelling catheter - UTI, MS diagnosed 8-9 years ago, quadriparesis, bed bound.   Pt was reportedly unable to swallow his antibiotic, likely due to lethargy .  CXR 12/12 NAD, Pt with FTT 10/2010.  PMH + baclofen, ducolax, Prozac, B12.  Pt reports problems with constipation, trying to make an effort to drink more water.   Type of Study: Bedside swallow evaluation Diet Prior to this Study: Regular;Thin liquids Respiratory Status: Room air Behavior/Cognition: Alert;Cooperative;Pleasant mood Oral Cavity - Dentition: Adequate natural dentition Patient Positioning: Upright in bed Baseline Vocal Quality: Low vocal intensity Volitional Cough: Weak Volitional Swallow: Able to elicit  Oral Motor/Sensory Function  Overall Oral Motor/Sensory Function: Impaired at baseline (due to MS)  Consistency Results  Ice Chips Ice chips: Impaired Pharyngeal Phase Impairments: Delayed Swallow  Thin Liquid Thin Liquid: Impaired Presentation: Cup Pharyngeal  Phase Impairments: Delayed Swallow Other Comments: pt advised not to use straws by a previous SLP  Nectar Thick Liquid Nectar Thick Liquid: Not tested  Honey Thick Liquid Honey Thick Liquid: Not tested  Puree Puree: Impaired Presentation: Spoon Oral Phase Impairments: Reduced lingual movement/coordination Pharyngeal Phase Impairments: Delayed Swallow  Solid Solid: Impaired Pharyngeal Phase Impairments: Delayed Swallow   Chales Abrahams 02/01/2011,9:50 AM

## 2011-02-02 ENCOUNTER — Encounter (HOSPITAL_COMMUNITY): Payer: Self-pay

## 2011-02-02 LAB — CBC
HCT: 34.1 % — ABNORMAL LOW (ref 39.0–52.0)
MCHC: 34.3 g/dL (ref 30.0–36.0)
Platelets: 274 10*3/uL (ref 150–400)
RDW: 14.1 % (ref 11.5–15.5)

## 2011-02-02 LAB — BASIC METABOLIC PANEL
BUN: 8 mg/dL (ref 6–23)
GFR calc Af Amer: >90 mL/min (ref >90)
GFR calc non Af Amer: >90 mL/min (ref >90)
Potassium: 3.3 mEq/L — ABNORMAL LOW (ref 3.5–5.1)
Sodium: 140 mEq/L (ref 135–145)

## 2011-02-02 LAB — URINE CULTURE: Colony Count: >=100000

## 2011-02-02 MED ORDER — ENSURE CLINICAL ST REVIGOR PO LIQD
237.0000 mL | Freq: Three times a day (TID) | ORAL | Status: DC
  Administered 2011-02-02 – 2011-02-05 (×5): 237 mL via ORAL

## 2011-02-02 MED ORDER — CIPROFLOXACIN HCL 500 MG PO TABS
500.0000 mg | ORAL_TABLET | Freq: Two times a day (BID) | ORAL | Status: DC
  Administered 2011-02-02 – 2011-02-05 (×7): 500 mg via ORAL
  Filled 2011-02-02 (×11): qty 1

## 2011-02-02 MED ORDER — POTASSIUM CHLORIDE CRYS ER 20 MEQ PO TBCR
20.0000 meq | EXTENDED_RELEASE_TABLET | Freq: Once | ORAL | Status: AC
  Administered 2011-02-02: 20 meq via ORAL
  Filled 2011-02-02: qty 1

## 2011-02-02 NOTE — Progress Notes (Signed)
CSW met with patient. Patient is alert and oriented x3. Patient defers to his wife, Terry Palmer. CSW called [name]. She is requesting CSW explore the option of patient going to [facility] upon discharge. Fl-2 completed and faxed to [facility]. Psychosocial assessment completed and placed in shadow chart. CSW to follow for bed offer. Ej Pinson C. Karin Pinedo MSW, LCSW [phone]

## 2011-02-02 NOTE — Progress Notes (Addendum)
Subjective: Mentation improved.  Weakness also improved.  Objective: Vital signs in last 24 hours: Filed Vitals:   02/01/11 1406 02/01/11 2055 02/02/11 0526 02/02/11 1452  BP: 112/75 129/84 127/79 105/74  Pulse: 84 91 102 89  Temp: 98.2 F (36.8 C) 97.7 F (36.5 C) 98.9 F (37.2 C) 98.7 F (37.1 C)  TempSrc: Oral Oral Oral Oral  Resp: 16 18 18 16   Height:      Weight:      SpO2: 99% 99% 96% 99%   Weight change:   Intake/Output Summary (Last 24 hours) at 02/02/11 1720 Last data filed at 02/02/11 1500  Gross per 24 hour  Intake 3346.58 ml  Output   1275 ml  Net 2071.58 ml    Physical Exam: General: Awake, Oriented, No acute distress. HEENT: EOMI. Neck: Supple CV: S1 and S2 Lungs: Clear to ascultation bilaterally Abdomen: Soft, Nontender, Nondistended, +bowel sounds. Ext: Good pulses. Trace edema.  Lab Results:  Basename 02/02/11 0325 02/01/11 0322  NA 140 137  K 3.3* 3.9  CL 109 103  CO2 22 22  GLUCOSE 89 114*  BUN 8 13  CREATININE 0.80 1.27  CALCIUM 8.6 9.3  MG -- --  PHOS -- --   No results found for this basename: AST:2,ALT:2,ALKPHOS:2,BILITOT:2,PROT:2,ALBUMIN:2 in the last 72 hours No results found for this basename: LIPASE:2,AMYLASE:2 in the last 72 hours  Basename 02/02/11 0325 02/01/11 0322 01/31/11 2210  WBC 9.1 15.7* --  NEUTROABS -- -- 11.7*  HGB 11.7* 13.5 --  HCT 34.1* 39.0 --  MCV 83.2 83.9 --  PLT 274 309 --   No results found for this basename: CKTOTAL:3,CKMB:3,CKMBINDEX:3,TROPONINI:3 in the last 72 hours No components found with this basename: POCBNP:3 No results found for this basename: DDIMER:2 in the last 72 hours No results found for this basename: HGBA1C:2 in the last 72 hours No results found for this basename: CHOL:2,HDL:2,LDLCALC:2,TRIG:2,CHOLHDL:2,LDLDIRECT:2 in the last 72 hours No results found for this basename: TSH,T4TOTAL,FREET3,T3FREE,THYROIDAB in the last 72 hours No results found for this basename:  VITAMINB12:2,FOLATE:2,FERRITIN:2,TIBC:2,IRON:2,RETICCTPCT:2 in the last 72 hours  Micro Results: Recent Results (from the past 240 hour(s))  URINE CULTURE     Status: Normal   Collection Time   01/31/11 10:08 PM      Component Value Range Status Comment   Specimen Description URINE, RANDOM   Final    Special Requests NONE   Final    Setup Time [id number]   Final    Colony Count >=100,000 COLONIES/ML   Final    Culture PROTEUS MIRABILIS   Final    Report Status 02/02/2011 FINAL   Final    Organism ID, Bacteria PROTEUS MIRABILIS   Final     Studies/Results: No results found.  Medications: I have reviewed the patient's current medications. Scheduled Meds:    . baclofen  10 mg Oral TID  . ciprofloxacin  500 mg Oral BID  . docusate sodium  100 mg Oral BID  . feeding supplement  237 mL Oral TID WC  . FLUoxetine  20 mg Oral Daily  . heparin  5,000 Units Subcutaneous Q8H  . interferon beta-1b  1 mg Subcutaneous QODAY  . potassium chloride  20 mEq Oral Once  . senna  1 tablet Oral BID  . DISCONTD: cefTRIAXone (ROCEPHIN)  IV  1 g Intravenous QHS   Continuous Infusions:    . sodium chloride 75 mL/hr at 02/02/11 1451   PRN Meds:.acetaminophen, acetaminophen  Assessment/Plan: 1.  Proteus mirabilis urinary tract infection.  Continue ceftriaxone.  Antibiotics since 02/01/2011.  Foley catheter changed in the emergency department.  Urine culture sensitive to ampicillin, cefazolin, ceftriaxone, ciprofloxacin, levofloxacin, gentamicin, Zosyn, tobramycin, and Bactrim.  2.  History of multiple sclerosis.  Patient was evaluated by speech therapy, swelling at baseline.  Continue regular diet.  Stable at this time continue home medications including interferon and baclofen.  Given improvement in weakness continue to monitor, spoke to neurology who recommended if weakness does not continue to improve to consider doing MRI of the brain with contrast to evaluate for any multiple sclerosis  exacerbation.  3.  Leukocytosis.  Likely due to #1.  Resolved.  4. Weakness generalized.  Continue PT.  Patient agreeable for placement.  5.  History of depression.  Continue fluoxetine.  6.  Hypokalemia. Replace PRN  7.  Prophylaxis.  Subcutaneous heparin.  8.  Disposition.  Patient agreeable for skilled nursing facility placement.   LOS: 2 days  Janise Gora A, MD 02/02/2011, 5:20 PM

## 2011-02-02 NOTE — Progress Notes (Signed)
PT Note Order received. Pt asleep this pm and wife not in room.  Will see in AM for PT eval Ebony Hail, PT

## 2011-02-02 NOTE — Evaluation (Signed)
Occupational Therapy Evaluation Patient Details Name: Terry Palmer MRN: [id number] DOB: 1975/11/12 Today's Date: 02/02/2011 Time in: 11:30 am Time out: 1150 am Eval II  Problem List:  Patient Active Problem List  Diagnoses  . Multiple sclerosis exacerbation  . UTI (urinary tract infection)  . Asthma  . Leukocytosis  . Weakness generalized    Past Medical History:  Past Medical History  Diagnosis Date  . MS (multiple sclerosis)   . UTI (urinary tract infection)   . Childhood asthma    Past Surgical History:  Past Surgical History  Procedure Date  . Lumbar puncture 10/12/2002    OT Assessment/Plan/Recommendation OT Assessment Clinical Impression Statement: Pt will benefit from trial of OT to facilitate increased independence with self feeding task and adaptive equipment education. OT Recommendation/Assessment: Patient will need skilled OT in the acute care venue OT Problem List: Decreased strength;Decreased range of motion;Decreased knowledge of use of DME or AE;Impaired UE functional use;Impaired tone;Increased edema OT Therapy Diagnosis : Generalized weakness OT Plan OT Frequency: Min 2X/week OT Treatment/Interventions: Self-care/ADL training;DME and/or AE instruction;Patient/family education OT Recommendation Follow Up Recommendations: Home health OT;Other (comment) (was receiving PTA) Equipment Recommended: None recommended by OT Individuals Consulted Consulted and Agree with Results and Recommendations: Patient;Family member/caregiver Family Member Consulted: wife OT Goals Acute Rehab OT Goals OT Goal Formulation: With patient/family Time For Goal Achievement: 2 weeks ADL Goals Pt Will Perform Eating: with max assist;Supine, head of bed up;with adaptive utensils;with assist to don/doff brace/orthosis ADL Goal: Eating - Progress: Goal set today  OT Evaluation Precautions/Restrictions  Restrictions Weight Bearing Restrictions: Yes Prior Functioning Home  Living Lives With: Spouse Receives Help From: Family;Other (Comment) (wife 24/7. Has HH therapy and aide 2X/week) Type of Home: House Home Adaptive Equipment: Bedside commode/3-in-1;Hospital bed;Wheelchair - powered;Other (comment) (hoyer lift) Prior Function Level of Independence: Needs assistance with ADLs;Other (comment) (pt bedbound.) Comments: Pt's wife used a hoyer lift up until about a month ago to get patient up to his power wheelchair. She has not gotten him up with lift since a month ago because he almost slid out of his wheelchair. Could not hold himself up in wheelchair. ADL ADL Eating/Feeding: Simulated;+1 Total assistance Eating/Feeding Details (indicate cue type and reason): pt 10% Where Assessed - Eating/Feeding: Bed level Grooming: Simulated;Teeth care;+1 Total assistance Where Assessed - Grooming: Supine, head of bed up Upper Body Bathing: Not assessed Lower Body Bathing: Not assessed Upper Body Dressing: Not assessed Lower Body Dressing: Not assessed Toilet Transfer: Not assessed Toilet Transfer Method: Not assessed Toileting - Clothing Manipulation: Not assessed Toileting - Hygiene: Not assessed Tub/Shower Transfer: Not assessed Tub/Shower Transfer Method: Not assessed ADL Comments: Spoke with wife earlier this am and she states patient was able to feed himself up until about 6 months ago but has since gotten alot weaker. She assists with all ADL at bed level and he wears a depends for toileting needs. She has been able to roll him by herself with patient  able to assist with some of the rolling. She would like for the patient to be able to feed himself again and patient is willing to work on this goal.  Vision/Perception    Cognition Cognition Arousal/Alertness: Awake/alert Cognition - Other Comments: pt able to answer simple questions with increased time Sensation/Coordination Sensation Light Touch: Not tested Extremity Assessment RUE Assessment RUE  Assessment: Exceptions to [facility] RUE AROM (degrees) Right Shoulder Flexion  0-170: 15 Degrees (able to raise arm slightly off bed) RUE Strength RUE Overall Strength  Comments: AAROM Right UE throughout WNL. Pt able to flex/extend elbow across his chest while holding toothbrush to bring it to his chin but then unable to lift hand to bring toothbrush to lips to simulate feeding. His hand is shaky with movement. He can flex/extend wrist with UE supported but not in a functional setting.  Gross Grasp: Impaired (able to lightly hold toothbrush and lightly squeeze OT hand) LUE Assessment LUE Assessment: Exceptions to [place] LUE Strength LUE Overall Strength Comments: note edema left hand. Elevated it on pillows. Pt is right handed per wife. Unable to lift left UE off the bed. AAROM performed but note some tone in UE especially with elbow extension. With AAROM of shoulder flexion, note patient able to assist slightly. Pt able to bend elbow to slide arm across chest  toward his mouth but only about 1/2 the way.  Mobility    Exercises   End of Session OT - End of Session Activity Tolerance: Patient tolerated treatment well Patient left: in bed;with call bell in reach General Behavior During Session: [facility] for tasks performed Cognition: [facility] for tasks performed   Lennox Laity [phone] 02/02/2011, 12:45 PM

## 2011-02-03 LAB — CBC
Hemoglobin: 12.1 g/dL — ABNORMAL LOW (ref 13.0–17.0)
MCHC: 34.1 g/dL (ref 30.0–36.0)
RDW: 14.3 % (ref 11.5–15.5)
WBC: 6.4 10*3/uL (ref 4.0–10.5)

## 2011-02-03 LAB — BASIC METABOLIC PANEL
GFR calc Af Amer: >90 mL/min (ref >90)
GFR calc non Af Amer: >90 mL/min (ref >90)
Glucose, Bld: 94 mg/dL (ref 70–99)
Potassium: 3.6 mEq/L (ref 3.5–5.1)
Sodium: 140 mEq/L (ref 135–145)

## 2011-02-03 NOTE — Progress Notes (Signed)
Subjective: Feeling better.  No other specific complaints.  Objective: Vital signs in last 24 hours: Filed Vitals:   02/02/11 0526 02/02/11 1452 02/02/11 2043 02/03/11 0542  BP: 127/79 105/74 130/92 110/74  Pulse: 102 89 78 66  Temp: 98.9 F (37.2 C) 98.7 F (37.1 C) 97.7 F (36.5 C) 98.3 F (36.8 C)  TempSrc: Oral Oral Oral Oral  Resp: 18 16 17 18   Height:      Weight:      SpO2: 96% 99% 95% 97%   Weight change:   Intake/Output Summary (Last 24 hours) at 02/03/11 1350 Last data filed at 02/03/11 0917  Gross per 24 hour  Intake   2380 ml  Output   1750 ml  Net    630 ml    Physical Exam: General: Awake, Oriented, No acute distress. HEENT: EOMI. Neck: Supple CV: S1 and S2 Lungs: Clear to ascultation bilaterally Abdomen: Soft, Nontender, Nondistended, +bowel sounds. Ext: Good pulses. Trace edema.  Extremities slightly contracted  Lab Results:  Basename 02/03/11 0430 02/02/11 0325  NA 140 140  K 3.6 3.3*  CL 107 109  CO2 24 22  GLUCOSE 94 89  BUN 6 8  CREATININE 0.90 0.80  CALCIUM 9.0 8.6  MG -- --  PHOS -- --   No results found for this basename: AST:2,ALT:2,ALKPHOS:2,BILITOT:2,PROT:2,ALBUMIN:2 in the last 72 hours No results found for this basename: LIPASE:2,AMYLASE:2 in the last 72 hours  Basename 02/03/11 0430 02/02/11 0325 01/31/11 2210  WBC 6.4 9.1 --  NEUTROABS -- -- 11.7*  HGB 12.1* 11.7* --  HCT 35.5* 34.1* --  MCV 83.7 83.2 --  PLT 259 274 --   No results found for this basename: CKTOTAL:3,CKMB:3,CKMBINDEX:3,TROPONINI:3 in the last 72 hours No components found with this basename: POCBNP:3 No results found for this basename: DDIMER:2 in the last 72 hours No results found for this basename: HGBA1C:2 in the last 72 hours No results found for this basename: CHOL:2,HDL:2,LDLCALC:2,TRIG:2,CHOLHDL:2,LDLDIRECT:2 in the last 72 hours No results found for this basename: TSH,T4TOTAL,FREET3,T3FREE,THYROIDAB in the last 72 hours No results found for  this basename: VITAMINB12:2,FOLATE:2,FERRITIN:2,TIBC:2,IRON:2,RETICCTPCT:2 in the last 72 hours  Micro Results: Recent Results (from the past 240 hour(s))  URINE CULTURE     Status: Normal   Collection Time   01/31/11 10:08 PM      Component Value Range Status Comment   Specimen Description URINE, RANDOM   Final    Special Requests NONE   Final    Setup Time [id number]   Final    Colony Count >=100,000 COLONIES/ML   Final    Culture PROTEUS MIRABILIS   Final    Report Status 02/02/2011 FINAL   Final    Organism ID, Bacteria PROTEUS MIRABILIS   Final     Studies/Results: No results found.  Medications: I have reviewed the patient's current medications. Scheduled Meds:    . baclofen  10 mg Oral TID  . ciprofloxacin  500 mg Oral BID  . docusate sodium  100 mg Oral BID  . feeding supplement  237 mL Oral TID WC  . FLUoxetine  20 mg Oral Daily  . heparin  5,000 Units Subcutaneous Q8H  . interferon beta-1b  1 mg Subcutaneous QODAY  . senna  1 tablet Oral BID   Continuous Infusions:    . sodium chloride 20 mL/hr at 02/03/11 0600   PRN Meds:.acetaminophen, acetaminophen  Assessment/Plan: 1.  Proteus mirabilis urinary tract infection.  Continue ciprofloxacin.  Antibiotics since 02/01/2011.  Foley catheter changed  in the emergency department.  Urine culture sensitive to ampicillin, cefazolin, ceftriaxone, ciprofloxacin, levofloxacin, gentamicin, Zosyn, tobramycin, and Bactrim.  Discontinue Foley catheter. Will attempt to see how the patient does over the weekend, if the patient has more than 200 cc post void residual consider placing back the Foley catheter.  2.  History of multiple sclerosis.  Patient was evaluated by speech therapy, swelling at baseline.  Continue regular diet.  Stable at this time continue home medications including interferon and baclofen.  Given improvement in weakness continue to monitor.  Hold further workup.  3.  Leukocytosis.  Likely due to #1.   Resolved.  4. Weakness generalized.  Continue PT.  Patient agreeable for placement.  5.  History of depression.  Continue fluoxetine.  6.  Hypokalemia. Replace PRN  7.  Prophylaxis.  Subcutaneous heparin.  8.  Disposition.  Pending skilled nursing facility.   LOS: 3 days  Jyllian Haynie A, MD 02/03/2011, 1:50 PM

## 2011-02-03 NOTE — Evaluation (Signed)
Physical Therapy Evaluation Patient Details Name: Terry Palmer MRN: [id number] DOB: 28-Jul-1975 Today's Date: 02/03/2011 9:45-10:14 EVII  Problem List:  Patient Active Problem List  Diagnoses  . Multiple sclerosis exacerbation  . UTI (urinary tract infection)  . Asthma  . Leukocytosis  . Weakness generalized  . Hypokalemia    Past Medical History:  Past Medical History  Diagnosis Date  . MS (multiple sclerosis)   . UTI (urinary tract infection)   . Childhood asthma    Past Surgical History:  Past Surgical History  Procedure Date  . Lumbar puncture 10/12/2002    PT Assessment/Plan/Recommendation PT Assessment Clinical Impression Statement: Pt with increased tone and functional dependence from MS.  Hopefully will improve to baseline of helping wife with bed mobility as UTI improves.  He would benefit from continued PT at SNF to work to decrease burden of care PT Recommendation/Assessment: Patient will need skilled PT in the acute care venue PT Problem List: Impaired tone;Decreased strength;Decreased activity tolerance;Decreased mobility Barriers to Discharge: Decreased caregiver support PT Therapy Diagnosis : Generalized weakness PT Plan PT Frequency: Min 3X/week PT Treatment/Interventions: Therapeutic activities;Functional mobility training;Therapeutic exercise PT Recommendation Follow Up Recommendations: Skilled nursing facility Equipment Recommended: Defer to next venue (pt states his hospital bed needs replaced) PT Goals  Acute Rehab PT Goals PT Goal Formulation: With patient Time For Goal Achievement: Other (comment) Pt will Roll Supine to Right Side: with min assist PT Goal: Rolling Supine to Right Side - Progress: Goal set today Pt will Roll Supine to Left Side: with mod assist PT Goal: Rolling Supine to Left Side - Progress: Goal set today Pt will Perform Home Exercise Program: with min assist;Other (comment) (wife and pt will perform exercise program) PT  Goal: Perform Home Exercise Program - Progress: Goal set today  PT Evaluation Precautions/Restrictions    Prior Functioning  Home Living Lives With: Spouse Receives Help From: Family;Other (Comment) (wife 24/7. Has HH therapy and aide 2X/week) Type of Home: House Home Adaptive Equipment: Bedside commode/3-in-1;Hospital bed;Wheelchair - powered;Other (comment) (hoyer lift) Prior Function Level of Independence: Needs assistance with ADLs;Other (comment) (pt bedbound.) Cognition Cognition Arousal/Alertness: Awake/alert Overall Cognitive Status: Appears within functional limits for tasks assessed Orientation Level: Oriented X4 Sensation/Coordination Sensation Additional Comments: pt has continual random movements of eyes, did not evaluate vision Coordination Gross Motor Movements are Fluid and Coordinated: No (Ataxia noted with attempts at upper extremity movement) Coordination and Movement Description: pt has ataxia with upper extremtiy movement and has bilateral increased resistance to passive movement and ankle clonus  Extremity Assessment RLE Assessment RLE Assessment: Exceptions to [facility] RLE Tone RLE Tone Comments: pt with increased resistance to passive movement and ankle clonus bilaterally, but less in right leg than left.he has WFL ROM of hip and knee, but heelcord tightness resulting in plantarflexion contracture LLE Assessment LLE Assessment: Exceptions to WFL LLE Tone LLE Tone Comments: increased tone in LLE with similar presentaion as RLE, thought slightly less Mobility (including Balance) Bed Mobility Bed Mobility: Yes Rolling Right: 3: Mod assist Rolling Right Details (indicate cue type and reason): max assist to get left leg to hip flexed, knee flexed position, pt can reach with left arm to rail and pull himself over Rolling Left: 2: Max assist Rolling Left Details (indicate cue type and reason): max assist to position leg and assist pt to initiate roll , pt tries to  help by pulling on rail Transfers Transfers: No Ambulation/Gait Ambulation/Gait: No Stairs: No Wheelchair Mobility Wheelchair Mobility: No  Posture/Postural  Control Posture/Postural Control: Postural limitations Postural Limitations: pt very tall and thin, movement limited by abnormal muscle activation and bilaterl tight heel cords that are very sensitive to stretch setting off clonus Balance Balance Assessed: No Exercise    End of Session PT - End of Session Activity Tolerance: Patient limited by fatigue (pt fell asleep easily after treatment) Patient left: in bed;with call bell in reach General Behavior During Session: [facility] for tasks performed Cognition: [facility] for tasks performed  Donnetta Hail 02/03/2011, 10:40 AM

## 2011-02-04 NOTE — Progress Notes (Signed)
Patient has voided incontinently several rimes during the night.. Bladder scaner indicates 141 ml this am

## 2011-02-04 NOTE — Progress Notes (Signed)
Subjective: Feeling better.  No other specific complaints.  Objective: Vital signs in last 24 hours: Filed Vitals:   02/03/11 0542 02/03/11 1405 02/03/11 2133 02/04/11 0525  BP: 110/74 114/80 123/80 110/74  Pulse: 66 77 79 83  Temp: 98.3 F (36.8 C) 98.1 F (36.7 C) 98.8 F (37.1 C) 98.1 F (36.7 C)  TempSrc: Oral Oral Oral Oral  Resp: 18 16 18 18   Height:      Weight:      SpO2: 97% 98% 99% 99%   Weight change:   Intake/Output Summary (Last 24 hours) at 02/04/11 1215 Last data filed at 02/04/11 1100  Gross per 24 hour  Intake    180 ml  Output      0 ml  Net    180 ml    Physical Exam: General: Awake, Oriented, No acute distress. HEENT: EOMI. Neck: Supple CV: S1 and S2 Lungs: Clear to ascultation bilaterally Abdomen: Soft, Nontender, Nondistended, +bowel sounds. Ext: Good pulses. Trace edema.  Extremities slightly contracted  Lab Results:  Basename 02/03/11 0430 02/02/11 0325  NA 140 140  K 3.6 3.3*  CL 107 109  CO2 24 22  GLUCOSE 94 89  BUN 6 8  CREATININE 0.90 0.80  CALCIUM 9.0 8.6  MG -- --  PHOS -- --   No results found for this basename: AST:2,ALT:2,ALKPHOS:2,BILITOT:2,PROT:2,ALBUMIN:2 in the last 72 hours No results found for this basename: LIPASE:2,AMYLASE:2 in the last 72 hours  Basename 02/03/11 0430 02/02/11 0325  WBC 6.4 9.1  NEUTROABS -- --  HGB 12.1* 11.7*  HCT 35.5* 34.1*  MCV 83.7 83.2  PLT 259 274   No results found for this basename: CKTOTAL:3,CKMB:3,CKMBINDEX:3,TROPONINI:3 in the last 72 hours No components found with this basename: POCBNP:3 No results found for this basename: DDIMER:2 in the last 72 hours No results found for this basename: HGBA1C:2 in the last 72 hours No results found for this basename: CHOL:2,HDL:2,LDLCALC:2,TRIG:2,CHOLHDL:2,LDLDIRECT:2 in the last 72 hours No results found for this basename: TSH,T4TOTAL,FREET3,T3FREE,THYROIDAB in the last 72 hours No results found for this basename:  VITAMINB12:2,FOLATE:2,FERRITIN:2,TIBC:2,IRON:2,RETICCTPCT:2 in the last 72 hours  Micro Results: Recent Results (from the past 240 hour(s))  URINE CULTURE     Status: Normal   Collection Time   01/31/11 10:08 PM      Component Value Range Status Comment   Specimen Description URINE, RANDOM   Final    Special Requests NONE   Final    Setup Time [id number]   Final    Colony Count >=100,000 COLONIES/ML   Final    Culture PROTEUS MIRABILIS   Final    Report Status 02/02/2011 FINAL   Final    Organism ID, Bacteria PROTEUS MIRABILIS   Final     Studies/Results: No results found.  Medications: I have reviewed the patient's current medications. Scheduled Meds:    . baclofen  10 mg Oral TID  . ciprofloxacin  500 mg Oral BID  . docusate sodium  100 mg Oral BID  . feeding supplement  237 mL Oral TID WC  . FLUoxetine  20 mg Oral Daily  . heparin  5,000 Units Subcutaneous Q8H  . interferon beta-1b  1 mg Subcutaneous QODAY  . senna  1 tablet Oral BID   Continuous Infusions:    . sodium chloride 20 mL/hr (02/04/11 0922)   PRN Meds:.acetaminophen, acetaminophen  Assessment/Plan: 1.  Proteus mirabilis urinary tract infection.  Continue ciprofloxacin.  Antibiotics since 02/01/2011, define a total of 10 days.  Foley catheter  changed in the emergency department.  Urine culture sensitive to ampicillin, cefazolin, ceftriaxone, ciprofloxacin, levofloxacin, gentamicin, Zosyn, tobramycin, and Bactrim.  Foley catheter discontinued yesterday.  So far post void residual less than 150 cc.  Will avoid replacing Foley catheter if not necessary given propensity for frequent urinary tract infections.   2.  History of multiple sclerosis.  Continue regular diet.  Stable at this time continue home medications including interferon and baclofen.  Given improvement in weakness continue to monitor.  Hold further workup.  3.  Leukocytosis.  Likely due to #1.  Resolved.  4. Weakness generalized.  Continue PT.     5.  History of depression.  Continue fluoxetine.  6.  Hypokalemia. Replace PRN  7.  Prophylaxis.  Subcutaneous heparin.  8.  Disposition.  Pending skilled nursing facility.   LOS: 4 days  Lanea Vankirk A, MD 02/04/2011, 12:15 PM

## 2011-02-04 NOTE — Plan of Care (Signed)
Problem: Phase II Progression Outcomes Goal: Voiding independently Outcome: Completed/Met Date Met:  02/04/11 But incontinent

## 2011-02-05 LAB — GLUCOSE, CAPILLARY: Glucose-Capillary: 95 mg/dL (ref 70–99)

## 2011-02-05 MED ORDER — DSS 100 MG PO CAPS: 100.0000 mg | ORAL_CAPSULE | Freq: Two times a day (BID) | ORAL | Status: DC

## 2011-02-05 MED ORDER — ENSURE CLINICAL ST REVIGOR PO LIQD: 237.0000 mL | Freq: Three times a day (TID) | ORAL | Status: DC

## 2011-02-05 MED ORDER — SENNA 8.6 MG PO TABS: 1.0000 | ORAL_TABLET | Freq: Two times a day (BID) | ORAL | Status: DC

## 2011-02-05 MED ORDER — CIPROFLOXACIN HCL 500 MG PO TABS: 500.0000 mg | ORAL_TABLET | Freq: Two times a day (BID) | ORAL | Status: DC

## 2011-02-05 MED ORDER — DSS 100 MG PO CAPS: 100.0000 mg | ORAL_CAPSULE | Freq: Two times a day (BID) | ORAL | Status: AC

## 2011-02-05 MED ORDER — INTERFERON BETA-1B 0.3 MG ~~LOC~~ SOLR
0.2500 mg | SUBCUTANEOUS | Status: DC
  Administered 2011-02-05: 0.25 mg via SUBCUTANEOUS
  Filled 2011-02-05: qty 0.3

## 2011-02-05 MED ORDER — ACETAMINOPHEN 325 MG PO TABS: 650.0000 mg | ORAL_TABLET | Freq: Four times a day (QID) | ORAL | Status: DC | PRN

## 2011-02-05 MED ORDER — INTERFERON BETA-1B 0.3 MG ~~LOC~~ SOLR
0.3000 mg | SUBCUTANEOUS | Status: DC
  Filled 2011-02-05: qty 0.3

## 2011-02-05 MED ORDER — CIPROFLOXACIN HCL 500 MG PO TABS: 500.0000 mg | ORAL_TABLET | Freq: Two times a day (BID) | ORAL | Status: AC

## 2011-02-05 NOTE — Progress Notes (Signed)
[facility] does not contract with patients insurance company. CSW gave bed offers to patients wife. She chooses [facility]. [facility] is working on authorization from [place]. CSW to follow for auth and discharge today.  Earnest Thalman C. Shannon Kirkendall MSW, LCSW [phone]

## 2011-02-05 NOTE — Progress Notes (Signed)
Gave Discharge instructions to wife along with prescriptions.  She verbalized understanding.  Will contact [facility] for transportation.

## 2011-02-05 NOTE — Progress Notes (Signed)
Subjective: Feeling better.  No other specific complaints.  Objective: Vital signs in last 24 hours: Filed Vitals:   02/04/11 0525 02/04/11 1338 02/04/11 2135 02/05/11 0510  BP: 110/74 110/77 124/85 100/68  Pulse: 83 73 78 74  Temp: 98.1 F (36.7 C) 98.7 F (37.1 C) 98.3 F (36.8 C) 98.6 F (37 C)  TempSrc: Oral Oral Oral Oral  Resp: 18 18 16 12   Height:      Weight:      SpO2: 99% 96% 97% 97%   Weight change:   Intake/Output Summary (Last 24 hours) at 02/05/11 1001 Last data filed at 02/05/11 0826  Gross per 24 hour  Intake    820 ml  Output    200 ml  Net    620 ml    Physical Exam: General: Awake, Oriented, No acute distress. HEENT: EOMI. Neck: Supple CV: S1 and S2 Lungs: Clear to ascultation bilaterally Abdomen: Soft, Nontender, Nondistended, +bowel sounds. Ext: Good pulses. Trace edema.  Extremities slightly contracted  Lab Results:  Basename 02/03/11 0430  NA 140  K 3.6  CL 107  CO2 24  GLUCOSE 94  BUN 6  CREATININE 0.90  CALCIUM 9.0  MG --  PHOS --   No results found for this basename: AST:2,ALT:2,ALKPHOS:2,BILITOT:2,PROT:2,ALBUMIN:2 in the last 72 hours No results found for this basename: LIPASE:2,AMYLASE:2 in the last 72 hours  Basename 02/03/11 0430  WBC 6.4  NEUTROABS --  HGB 12.1*  HCT 35.5*  MCV 83.7  PLT 259   No results found for this basename: CKTOTAL:3,CKMB:3,CKMBINDEX:3,TROPONINI:3 in the last 72 hours No components found with this basename: POCBNP:3 No results found for this basename: DDIMER:2 in the last 72 hours No results found for this basename: HGBA1C:2 in the last 72 hours No results found for this basename: CHOL:2,HDL:2,LDLCALC:2,TRIG:2,CHOLHDL:2,LDLDIRECT:2 in the last 72 hours No results found for this basename: TSH,T4TOTAL,FREET3,T3FREE,THYROIDAB in the last 72 hours No results found for this basename: VITAMINB12:2,FOLATE:2,FERRITIN:2,TIBC:2,IRON:2,RETICCTPCT:2 in the last 72 hours  Micro Results: Recent Results  (from the past 240 hour(s))  URINE CULTURE     Status: Normal   Collection Time   01/31/11 10:08 PM      Component Value Range Status Comment   Specimen Description URINE, RANDOM   Final    Special Requests NONE   Final    Setup Time [id number]   Final    Colony Count >=100,000 COLONIES/ML   Final    Culture PROTEUS MIRABILIS   Final    Report Status 02/02/2011 FINAL   Final    Organism ID, Bacteria PROTEUS MIRABILIS   Final     Studies/Results: No results found.  Medications: I have reviewed the patient's current medications. Scheduled Meds:    . baclofen  10 mg Oral TID  . ciprofloxacin  500 mg Oral BID  . docusate sodium  100 mg Oral BID  . feeding supplement  237 mL Oral TID WC  . FLUoxetine  20 mg Oral Daily  . heparin  5,000 Units Subcutaneous Q8H  . interferon beta-1b  1 mg Subcutaneous QODAY  . senna  1 tablet Oral BID   Continuous Infusions:    . DISCONTD: sodium chloride 20 mL/hr (02/04/11 0922)   PRN Meds:.acetaminophen, acetaminophen  Assessment/Plan: 1.  Proteus mirabilis urinary tract infection.  Continue ciprofloxacin.  Antibiotics since 02/01/2011, define a total of 10 days.  Foley catheter changed in the emergency department which since then has been discontinued.  Urine culture sensitive to ampicillin, cefazolin, ceftriaxone, ciprofloxacin, levofloxacin,  gentamicin, Zosyn, tobramycin, and Bactrim.  So far patient able to void independently.  Currently has a condom catheter and tolerating it well.  Will avoid replacing Foley catheter if not necessary given propensity for frequent urinary tract infections.   2.  History of multiple sclerosis.  Continue regular diet.  Stable at this time continue home medications including interferon and baclofen.  Given improvement in weakness continue to monitor.  Hold further workup.  3.  Leukocytosis.  Likely due to #1.  Resolved.  4. Weakness generalized.  Continue PT.    5.  History of depression.  Continue  fluoxetine.  6.  Hypokalemia. Replace PRN  7.  Prophylaxis.  Subcutaneous heparin.  8.  Disposition.  Pending skilled nursing facility.   LOS: 5 days  Salisa Broz A, MD 02/05/2011, 10:01 AM

## 2011-02-05 NOTE — Progress Notes (Signed)
Spoke with pt's wife concerning discharge needs. She is taking pt home and wants to continue with [facility] with [facility], HHPT/OT, and HHCSW. Spoke with Corrie Dandy, RN with [facility] and faxed documents to [facility] [phone]. mp

## 2011-02-05 NOTE — Discharge Summary (Addendum)
Discharge Summary  JAKAI RISSE MR#: [id number]  DOB:1975/04/03  Date of Admission: 01/31/2011 Date of Discharge: 02/05/2011  Patient's PCP: Dorrene German, MD, MD  Attending Physician:Colbie Sliker A  Consults: None.  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  With history of urinary tract infections presented with fever on January 24 of 2013.  Discharge Medications Medication List  As of 02/05/2011 12:45 PM   TAKE these medications         acetaminophen 325 MG tablet   Commonly known as: TYLENOL   Take 2 tablets (650 mg total) by mouth every 6 (six) hours as needed (or Fever >/= 101).      baclofen 10 MG tablet   Commonly known as: LIORESAL   Take 10 mg by mouth 3 (three) times daily.      BETASERON 0.3 MG injection   Generic drug: interferon beta-1b   Inject 0.25 mg into the skin every other day.      bisacodyl 5 MG EC tablet   Commonly known as: DULCOLAX   Take 10 mg by mouth daily as needed. For constipation      cholecalciferol 1000 UNITS tablet   Commonly known as: VITAMIN D   Take 1,000 Units by mouth daily.      ciprofloxacin 500 MG tablet   Commonly known as: CIPRO   Take 1 tablet (500 mg total) by mouth 2 (two) times daily. Until 02/11/2011.      DSS 100 MG Caps   Take 100 mg by mouth 2 (two) times daily.      feeding supplement Liqd   Take 237 mLs by mouth 3 (three) times daily with meals.      fish oil-omega-3 fatty acids 1000 MG capsule   Take 1,200 mg by mouth daily.      FLUoxetine 20 MG capsule   Commonly known as: PROZAC   Take 20 mg by mouth daily.      Garlic 1000 MG Caps   Take 1,000 mg by mouth daily.      senna 8.6 MG Tabs   Commonly known as: SENOKOT   Take 1  tablet (8.6 mg total) by mouth 2 (two) times daily.      vitamin B-12 1000 MCG tablet   Commonly known as: CYANOCOBALAMIN   Take 2,000 mcg by mouth daily.            Hospital Course: 1.  Proteus mirabilis urinary tract infection.  Continue ciprofloxacin.  Antibiotics since 02/01/2011, define a total of 10 days, antibiotics to be discontinued on February 3 of 2013.  Foley catheter changed in the emergency department which since then has been discontinued.  Urine culture sensitive to ampicillin, cefazolin, ceftriaxone, ciprofloxacin, levofloxacin, gentamicin, Zosyn, tobramycin, and Bactrim.  So far patient able to void independently after Foley catheter has been discontinued.  Currently has a condom catheter and tolerating it well.  Would avoid replacing Foley catheter if not necessary given propensity for frequent urinary tract infections.   2.  History of multiple sclerosis.  Continue regular diet.  Stable at this time continue home medications including interferon and baclofen.  Given improvement in weakness continue to monitor.  Did not do any further workup during the course of hospital stay.  3.  Leukocytosis.  Likely due to  urinary tract infection.  Resolved.  4. Weakness generalized.  Continue PT.  Had a discussion with patient and wife who agreed for skilled nursing facility placement which will be arranged at the time of discharge.  5.  History of depression.  Continue fluoxetine.  6.  Hypokalemia. Replace PRN, resolved with replacement.  Day of Discharge BP 100/68  Pulse 74  Temp(Src) 98.6 F (37 C) (Oral)  Resp 12  Ht 6' (1.829 m)  Wt 70.7 kg (155 lb 13.8 oz)  BMI 21.14 kg/m2  SpO2 97%  Results for orders placed during the hospital encounter of 01/31/11 (from the past 48 hour(s))  GLUCOSE, CAPILLARY     Status: Normal   Collection Time   02/04/11  8:05 AM      Component Value Range Comment   Glucose-Capillary 94  70 - 99 (mg/dL)     No results  found.   Disposition: To skilled nursing facility for rehabilitation.  Diet: Regular  Activity: As tolerated as per physical therapy.   Follow-up Appts: Discharge Orders    Future Orders Please Complete By Expires   Diet general      Increase activity slowly      Discharge instructions      Comments:   Followup with Dorrene German, MD (PCP) in 1 week after discharge.      TESTS THAT NEED FOLLOW-UP None  Time spent on discharge, talking to the patient, and coordinating care:  25 mins.  Signed: Cristal Ford, MD 02/05/2011, 10:06 AM   Addendum: As arrangements were made for skilled nursing facility discharge.  Patient indicated that after discussion with his mother he would like to go home with home health.  As a result will arrange for discharge home with home health.  Traevon Meiring A, MD 02/05/2011, 2:50 PM

## 2011-02-06 LAB — GLUCOSE, CAPILLARY: Glucose-Capillary: 95 mg/dL (ref 70–99)

## 2011-03-01 ENCOUNTER — Encounter (HOSPITAL_COMMUNITY): Payer: Self-pay | Admitting: Family Medicine

## 2011-03-01 ENCOUNTER — Emergency Department (HOSPITAL_COMMUNITY)
Admission: EM | Admit: 2011-03-01 | Discharge: 2011-03-01 | Disposition: A | Discharge status: Home / Self Care | Payer: Medicare HMO | Attending: Emergency Medicine | Admitting: Emergency Medicine

## 2011-03-01 DIAGNOSIS — M2569 Stiffness of other specified joint, not elsewhere classified: Secondary | ICD-10-CM | POA: Insufficient documentation

## 2011-03-01 DIAGNOSIS — R300 Dysuria: Secondary | ICD-10-CM | POA: Insufficient documentation

## 2011-03-01 DIAGNOSIS — M216X9 Other acquired deformities of unspecified foot: Secondary | ICD-10-CM | POA: Insufficient documentation

## 2011-03-01 DIAGNOSIS — Z79899 Other long term (current) drug therapy: Secondary | ICD-10-CM | POA: Insufficient documentation

## 2011-03-01 DIAGNOSIS — G35 Multiple sclerosis: Secondary | ICD-10-CM | POA: Insufficient documentation

## 2011-03-01 DIAGNOSIS — N390 Urinary tract infection, site not specified: Secondary | ICD-10-CM | POA: Insufficient documentation

## 2011-03-01 LAB — URINE MICROSCOPIC-ADD ON

## 2011-03-01 LAB — URINALYSIS, ROUTINE W REFLEX MICROSCOPIC
Hgb urine dipstick: NEGATIVE
Nitrite: NEGATIVE
Protein, ur: NEGATIVE mg/dL
Specific Gravity, Urine: 1.027 (ref 1.005–1.030)
Urobilinogen, UA: 0.2 mg/dL (ref 0.0–1.0)

## 2011-03-01 MED ORDER — CEPHALEXIN 500 MG PO CAPS: 500.0000 mg | ORAL_CAPSULE | Freq: Four times a day (QID) | ORAL | Status: AC

## 2011-03-01 MED ORDER — CEFTRIAXONE SODIUM 1 G IJ SOLR
1.0000 g | Freq: Once | INTRAMUSCULAR | Status: AC
  Administered 2011-03-01: 1 g via INTRAMUSCULAR
  Filled 2011-03-01: qty 10

## 2011-03-01 MED ORDER — LIDOCAINE HCL 1 % IJ SOLN
INTRAMUSCULAR | Status: AC
  Administered 2011-03-01: 2.1 mL
  Filled 2011-03-01: qty 20

## 2011-03-01 NOTE — ED Notes (Signed)
Per EMS: pt from home, lives with wife, is in normal state, able to answer questions, but is slow to respond, reports hx of MS.  States Home health nurse did a quick cath and had cloudy urine return and hit a significant blockage.  Reports dysuria and urinary retention.

## 2011-03-01 NOTE — ED Notes (Signed)
WUJ:[id number]<JY> Expected date:03/01/11<BR> Expected time:<BR> Means of arrival:<BR> Comments:<BR> EMS 11 GC, 35 yom abd. Pain w UTI

## 2011-03-01 NOTE — ED Notes (Signed)
Patient is resting comfortably. 

## 2011-03-01 NOTE — Discharge Instructions (Signed)

## 2011-03-01 NOTE — ED Provider Notes (Signed)
History     CSN: [id number]  Arrival date & time 03/01/11  1431   First MD Initiated Contact with Patient 03/01/11 1451      Chief Complaint  Patient presents with  . Urinary Tract Infection     (Consider location/radiation/quality/duration/timing/severity/associated sxs/prior treatment) HPI Comments: Patient is 36 year old male with a history of MS who presents after his home health nurse noted that she had difficulty doing an I&O cath and then noticed cloudy urine - there has been no fever, chills, nausea or vomiting - patient denies any pain at this time.  Patient is a 36 y.o. male presenting with male genitourinary complaint. The history is provided by the patient and a caregiver. No language interpreter was used.  Male GU Problem Primary symptoms include dysuria.  Primary symptoms include no genital itching, no genital lesions, no genital rash, no penile discharge, no penile pain, no priapism and no scrotal pain. This is a new problem. The current episode started yesterday. The problem occurs constantly. The problem has not changed since onset.The symptoms occur during urination. Penile discharge characteristics: no discharge. Pertinent negatives include no anorexia, no diaphoresis, no nausea, no vomiting, no abdominal pain, no abdominal swelling, no frequency, no constipation and no diarrhea. There has been no fever. He has tried nothing for the symptoms. The treatment provided no relief. Sexual activity: non-contributory. Associated medical issues do not include gonorrhea, syphilis, chlamydia or Herpes simplex.    Past Medical History  Diagnosis Date  . MS (multiple sclerosis)   . UTI (urinary tract infection)   . Childhood asthma     Past Surgical History  Procedure Date  . Lumbar puncture 10/12/2002    Family History  Problem Relation Age of Onset  . Asthma Mother     History  Substance Use Topics  . Smoking status: Former Smoker    Types: Cigarettes    Quit date:  02/02/1999  . Smokeless tobacco: Former Neurosurgeon  . Alcohol Use: No      Review of Systems  Constitutional: Negative for diaphoresis.  Gastrointestinal: Negative for nausea, vomiting, abdominal pain, diarrhea, constipation and anorexia.  Genitourinary: Positive for dysuria and difficulty urinating. Negative for frequency, penile pain and penile discharge.  All other systems reviewed and are negative.    Allergies  Review of patient's allergies indicates no known allergies.  Home Medications   Current Outpatient Rx  Name Route Sig Dispense Refill  . ACETAMINOPHEN 325 MG PO TABS Oral Take 2 tablets (650 mg total) by mouth every 6 (six) hours as needed (or Fever >/= 101).    Marland Kitchen BACLOFEN 10 MG PO TABS Oral Take 10 mg by mouth 3 (three) times daily.     Marland Kitchen BISACODYL 5 MG PO TBEC Oral Take 10 mg by mouth daily as needed. For constipation     . VITAMIN D 1000 UNITS PO TABS Oral Take 1,000 Units by mouth daily.      Marland Kitchen ENSURE CLINICAL ST REVIGOR PO LIQD Oral Take 237 mLs by mouth 3 (three) times daily with meals.    . OMEGA-3 FATTY ACIDS 1000 MG PO CAPS Oral Take 1,200 mg by mouth daily.      Marland Kitchen FLUOXETINE HCL 20 MG PO CAPS Oral Take 20 mg by mouth daily.     Marland Kitchen GARLIC 1000 MG PO CAPS Oral Take 1,000 mg by mouth daily.      . INTERFERON BETA-1B 0.3 MG [place] SOLR Subcutaneous Inject 0.25 mg into the skin every other  day.    Marland Kitchen SENNA 8.6 MG PO TABS Oral Take 1 tablet (8.6 mg total) by mouth 2 (two) times daily. Hold for diarrhea. 60 each 0  . VITAMIN B-12 1000 MCG PO TABS Oral Take 2,000 mcg by mouth daily.        BP 116/86  Pulse 93  Temp(Src) 98.2 F (36.8 C) (Oral)  Resp 18  SpO2 99%  Physical Exam  Nursing note and vitals reviewed. Constitutional: He is oriented to person, place, and time. He appears well-developed and well-nourished. No distress.  HENT:  Head: Normocephalic and atraumatic.  Right Ear: External ear normal.  Left Ear: External ear normal.  Nose: Nose normal.    Mouth/Throat: Oropharynx is clear and moist. No oropharyngeal exudate.       Lips dry and chapped  Eyes: Conjunctivae are normal. Pupils are equal, round, and reactive to light. No scleral icterus.  Neck: Normal range of motion. Neck supple.  Cardiovascular: Normal rate, regular rhythm and normal heart sounds.  Exam reveals no gallop and no friction rub.   No murmur heard. Pulmonary/Chest: Effort normal and breath sounds normal. He exhibits no tenderness.  Abdominal: Soft. Bowel sounds are normal. He exhibits no distension. There is no tenderness.  Genitourinary: Penis normal. Right testis shows no mass and no tenderness. Left testis shows no mass and no tenderness. Circumcised. No penile tenderness. No discharge found.  Musculoskeletal:       Bilateral foot drop and stiff lower extremities  Lymphadenopathy:    He has no cervical adenopathy.  Neurological: He is alert and oriented to person, place, and time. No cranial nerve deficit.  Skin: Skin is warm and dry. No rash noted. No erythema. No pallor.  Psychiatric: He has a normal mood and affect. His behavior is normal. Judgment and thought content normal.    ED Course  Procedures (including critical care time)  Labs Reviewed  URINALYSIS, ROUTINE W REFLEX MICROSCOPIC - Abnormal; Notable for the following:    APPearance CLOUDY (*)    Ketones, ur TRACE (*)    Leukocytes, UA SMALL (*)    All other components within normal limits  URINE MICROSCOPIC-ADD ON - Abnormal; Notable for the following:    Bacteria, UA FEW (*)    All other components within normal limits  URINE CULTURE   No results found.  Results for orders placed during the hospital encounter of 03/01/11  URINALYSIS, ROUTINE W REFLEX MICROSCOPIC      Component Value Range   Color, Urine YELLOW  YELLOW    APPearance CLOUDY (*) CLEAR    Specific Gravity, Urine 1.027  1.005 - 1.030    pH 6.0  5.0 - 8.0    Glucose, UA NEGATIVE  NEGATIVE (mg/dL)   Hgb urine dipstick  NEGATIVE  NEGATIVE    Bilirubin Urine NEGATIVE  NEGATIVE    Ketones, ur TRACE (*) NEGATIVE (mg/dL)   Protein, ur NEGATIVE  NEGATIVE (mg/dL)   Urobilinogen, UA 0.2  0.0 - 1.0 (mg/dL)   Nitrite NEGATIVE  NEGATIVE    Leukocytes, UA SMALL (*) NEGATIVE   URINE MICROSCOPIC-ADD ON      Component Value Range   Squamous Epithelial / LPF RARE  RARE    WBC, UA 7-10  <3 (WBC/hpf)   Bacteria, UA FEW (*) RARE    No results found.   UTI    MDM  Patient with mild UTI - family concerned about frequency of UTI, tried reassurance but they will be speaking with Dr.  Yelverton as well - plan to d/c home - culture sent.       Izola Price [place], [place] 03/01/11 1659

## 2011-03-01 NOTE — ED Notes (Signed)
Patient denies pain and is resting comfortably.  

## 2011-03-01 NOTE — ED Provider Notes (Signed)
Medical screening examination/treatment/procedure(s) were performed by non-physician practitioner and as supervising physician I was immediately available for consultation/collaboration.   Loren Racer, MD 03/01/11 [phone]

## 2011-03-01 NOTE — ED Notes (Signed)
MD at bedside. 

## 2011-03-01 NOTE — ED Notes (Signed)
Vital signs stable. 

## 2011-03-03 LAB — URINE CULTURE: Special Requests: NORMAL

## 2011-03-04 NOTE — ED Notes (Signed)
+   Urine. Chart sent to EDP office for review. Given Keflex-No sensitivity listed.

## 2011-03-06 NOTE — ED Notes (Addendum)
Patient informed of positive results  

## 2011-03-06 NOTE — ED Notes (Signed)
Prescription called in to [facility] at [phone] for macrodantin 100mg  po bid x 7 days per [employer], no refills

## 2011-03-12 ENCOUNTER — Emergency Department (HOSPITAL_COMMUNITY): Payer: Medicare HMO

## 2011-03-12 ENCOUNTER — Other Ambulatory Visit: Payer: Self-pay

## 2011-03-12 ENCOUNTER — Encounter (HOSPITAL_COMMUNITY): Payer: Self-pay | Admitting: Emergency Medicine

## 2011-03-12 ENCOUNTER — Inpatient Hospital Stay (HOSPITAL_COMMUNITY)
Admission: EM | Admit: 2011-03-12 | Discharge: 2011-03-26 | DRG: 058 | Disposition: A | Discharge status: Home Health | Payer: Medicare HMO | Attending: Internal Medicine | Admitting: Internal Medicine

## 2011-03-12 DIAGNOSIS — Z681 Body mass index (BMI) 19 or less, adult: Secondary | ICD-10-CM

## 2011-03-12 DIAGNOSIS — D72829 Elevated white blood cell count, unspecified: Secondary | ICD-10-CM | POA: Diagnosis present

## 2011-03-12 DIAGNOSIS — G8250 Quadriplegia, unspecified: Secondary | ICD-10-CM | POA: Diagnosis present

## 2011-03-12 DIAGNOSIS — D649 Anemia, unspecified: Secondary | ICD-10-CM | POA: Diagnosis not present

## 2011-03-12 DIAGNOSIS — G9340 Encephalopathy, unspecified: Secondary | ICD-10-CM

## 2011-03-12 DIAGNOSIS — R633 Feeding difficulties, unspecified: Secondary | ICD-10-CM

## 2011-03-12 DIAGNOSIS — F329 Major depressive disorder, single episode, unspecified: Secondary | ICD-10-CM | POA: Diagnosis present

## 2011-03-12 DIAGNOSIS — J690 Pneumonitis due to inhalation of food and vomit: Secondary | ICD-10-CM | POA: Diagnosis not present

## 2011-03-12 DIAGNOSIS — R5381 Other malaise: Secondary | ICD-10-CM | POA: Diagnosis present

## 2011-03-12 DIAGNOSIS — R319 Hematuria, unspecified: Secondary | ICD-10-CM | POA: Diagnosis present

## 2011-03-12 DIAGNOSIS — R3129 Other microscopic hematuria: Secondary | ICD-10-CM | POA: Diagnosis present

## 2011-03-12 DIAGNOSIS — N390 Urinary tract infection, site not specified: Secondary | ICD-10-CM | POA: Diagnosis present

## 2011-03-12 DIAGNOSIS — R1310 Dysphagia, unspecified: Secondary | ICD-10-CM | POA: Diagnosis present

## 2011-03-12 DIAGNOSIS — N319 Neuromuscular dysfunction of bladder, unspecified: Secondary | ICD-10-CM | POA: Diagnosis present

## 2011-03-12 DIAGNOSIS — E46 Unspecified protein-calorie malnutrition: Secondary | ICD-10-CM

## 2011-03-12 DIAGNOSIS — K5641 Fecal impaction: Secondary | ICD-10-CM | POA: Diagnosis present

## 2011-03-12 DIAGNOSIS — N210 Calculus in bladder: Secondary | ICD-10-CM | POA: Diagnosis present

## 2011-03-12 DIAGNOSIS — Z7401 Bed confinement status: Secondary | ICD-10-CM

## 2011-03-12 DIAGNOSIS — Z79899 Other long term (current) drug therapy: Secondary | ICD-10-CM

## 2011-03-12 DIAGNOSIS — R531 Weakness: Secondary | ICD-10-CM | POA: Diagnosis present

## 2011-03-12 DIAGNOSIS — B952 Enterococcus as the cause of diseases classified elsewhere: Secondary | ICD-10-CM | POA: Diagnosis present

## 2011-03-12 DIAGNOSIS — E876 Hypokalemia: Secondary | ICD-10-CM | POA: Diagnosis not present

## 2011-03-12 DIAGNOSIS — F3289 Other specified depressive episodes: Secondary | ICD-10-CM | POA: Diagnosis present

## 2011-03-12 DIAGNOSIS — G35 Multiple sclerosis: Principal | ICD-10-CM | POA: Diagnosis present

## 2011-03-12 DIAGNOSIS — G9349 Other encephalopathy: Secondary | ICD-10-CM | POA: Diagnosis present

## 2011-03-12 DIAGNOSIS — IMO0001 Reserved for inherently not codable concepts without codable children: Secondary | ICD-10-CM | POA: Diagnosis present

## 2011-03-12 DIAGNOSIS — Z87891 Personal history of nicotine dependence: Secondary | ICD-10-CM

## 2011-03-12 HISTORY — DX: Major depressive disorder, single episode, unspecified: F32.9

## 2011-03-12 HISTORY — DX: Reserved for inherently not codable concepts without codable children: IMO0001

## 2011-03-12 HISTORY — DX: Myoneural disorder, unspecified: G70.9

## 2011-03-12 HISTORY — DX: Quadriplegia, unspecified: G82.50

## 2011-03-12 HISTORY — DX: Depression, unspecified: F32.A

## 2011-03-12 LAB — BASIC METABOLIC PANEL
BUN: 13 mg/dL (ref 6–23)
Chloride: 106 mEq/L (ref 96–112)
GFR calc Af Amer: >90 mL/min (ref >90)
GFR calc non Af Amer: >90 mL/min (ref >90)
Glucose, Bld: 98 mg/dL (ref 70–99)
Potassium: 4.2 mEq/L (ref 3.5–5.1)
Sodium: 142 mEq/L (ref 135–145)

## 2011-03-12 LAB — CBC
Hemoglobin: 13.2 g/dL (ref 13.0–17.0)
Platelets: 314 10*3/uL (ref 150–400)
RBC: 4.47 MIL/uL (ref 4.22–5.81)
WBC: 12 10*3/uL — ABNORMAL HIGH (ref 4.0–10.5)

## 2011-03-12 LAB — URINE MICROSCOPIC-ADD ON

## 2011-03-12 LAB — MAGNESIUM: Magnesium: 2.6 mg/dL — ABNORMAL HIGH (ref 1.5–2.5)

## 2011-03-12 LAB — DIFFERENTIAL
Eosinophils Absolute: 0.3 10*3/uL (ref 0.0–0.7)
Lymphs Abs: 1.6 10*3/uL (ref 0.7–4.0)
Monocytes Relative: 8 % (ref 3–12)
Neutro Abs: 9.1 10*3/uL — ABNORMAL HIGH (ref 1.7–7.7)
Neutrophils Relative %: 76 % (ref 43–77)

## 2011-03-12 LAB — URINALYSIS, ROUTINE W REFLEX MICROSCOPIC
Nitrite: NEGATIVE
Specific Gravity, Urine: 1.029 (ref 1.005–1.030)
Urobilinogen, UA: 0.2 mg/dL (ref 0.0–1.0)
pH: 6 (ref 5.0–8.0)

## 2011-03-12 LAB — HEPATIC FUNCTION PANEL
ALT: 18 U/L (ref 0–53)
AST: 23 U/L (ref 0–37)
Albumin: 3.1 g/dL — ABNORMAL LOW (ref 3.5–5.2)
Alkaline Phosphatase: 98 U/L (ref 39–117)
Total Bilirubin: 0.4 mg/dL (ref 0.3–1.2)

## 2011-03-12 LAB — APTT: aPTT: 30 seconds (ref 24–37)

## 2011-03-12 MED ORDER — BACLOFEN 20 MG PO TABS
20.0000 mg | ORAL_TABLET | Freq: Three times a day (TID) | ORAL | Status: DC
  Administered 2011-03-13 – 2011-03-17 (×10): 20 mg via ORAL
  Filled 2011-03-12 (×17): qty 1

## 2011-03-12 MED ORDER — PREDNISONE 20 MG PO TABS: 30.0000 mg | ORAL_TABLET | Freq: Once | ORAL | Status: DC

## 2011-03-12 MED ORDER — ALUM & MAG HYDROXIDE-SIMETH 200-200-20 MG/5ML PO SUSP
30.0000 mL | Freq: Four times a day (QID) | ORAL | Status: DC | PRN
  Administered 2011-03-20: 30 mL via ORAL
  Filled 2011-03-12: qty 30

## 2011-03-12 MED ORDER — INTERFERON BETA-1B 0.3 MG ~~LOC~~ SOLR
0.3000 mg | SUBCUTANEOUS | Status: DC
  Administered 2011-03-13 – 2011-03-19 (×4): 0.3 mg via SUBCUTANEOUS
  Filled 2011-03-12: qty 0.3

## 2011-03-12 MED ORDER — OMEGA-3-ACID ETHYL ESTERS 1 G PO CAPS
1.0000 g | ORAL_CAPSULE | Freq: Every day | ORAL | Status: DC
  Filled 2011-03-12 (×5): qty 1

## 2011-03-12 MED ORDER — PREDNISONE (PAK) 5 MG PO TABS: 15.0000 mg | ORAL_TABLET | Freq: Every day | ORAL | Status: DC

## 2011-03-12 MED ORDER — PANTOPRAZOLE SODIUM 40 MG IV SOLR
40.0000 mg | INTRAVENOUS | Status: DC
  Administered 2011-03-12 – 2011-03-13 (×2): 40 mg via INTRAVENOUS
  Filled 2011-03-12 (×3): qty 40

## 2011-03-12 MED ORDER — IBUPROFEN 200 MG PO TABS
200.0000 mg | ORAL_TABLET | Freq: Four times a day (QID) | ORAL | Status: DC | PRN
  Filled 2011-03-12: qty 1

## 2011-03-12 MED ORDER — PREDNISONE 5 MG PO TABS
15.0000 mg | ORAL_TABLET | Freq: Every day | ORAL | Status: DC
  Administered 2011-03-14 – 2011-03-17 (×3): 15 mg via ORAL
  Filled 2011-03-12 (×6): qty 1

## 2011-03-12 MED ORDER — SODIUM CHLORIDE 0.9 % IV SOLN
INTRAVENOUS | Status: DC
  Administered 2011-03-12 – 2011-03-13 (×3): via INTRAVENOUS

## 2011-03-12 MED ORDER — OMEGA-3 FATTY ACIDS 1000 MG PO CAPS: 1200.0000 mg | ORAL_CAPSULE | Freq: Every day | ORAL | Status: DC

## 2011-03-12 MED ORDER — ACETAMINOPHEN 650 MG RE SUPP
650.0000 mg | Freq: Four times a day (QID) | RECTAL | Status: DC | PRN
  Administered 2011-03-17 – 2011-03-25 (×4): 650 mg via RECTAL
  Filled 2011-03-12 (×6): qty 1

## 2011-03-12 MED ORDER — SODIUM CHLORIDE 0.9 % IV BOLUS (SEPSIS)
1000.0000 mL | Freq: Once | INTRAVENOUS | Status: AC
  Administered 2011-03-12: 1000 mL via INTRAVENOUS

## 2011-03-12 MED ORDER — METHYLPREDNISOLONE SODIUM SUCC 125 MG IJ SOLR
60.0000 mg | Freq: Once | INTRAMUSCULAR | Status: AC
  Administered 2011-03-12: 60 mg via INTRAVENOUS
  Filled 2011-03-12: qty 2

## 2011-03-12 MED ORDER — VITAMIN B-12 1000 MCG PO TABS
1000.0000 ug | ORAL_TABLET | Freq: Every day | ORAL | Status: DC
  Administered 2011-03-13 – 2011-03-15 (×3): 1000 ug via ORAL
  Filled 2011-03-12 (×5): qty 1

## 2011-03-12 MED ORDER — ONDANSETRON HCL 4 MG PO TABS: 4.0000 mg | ORAL_TABLET | Freq: Four times a day (QID) | ORAL | Status: DC | PRN

## 2011-03-12 MED ORDER — OXYCODONE HCL 5 MG PO TABS
5.0000 mg | ORAL_TABLET | ORAL | Status: DC | PRN
  Administered 2011-03-15: 5 mg via ORAL
  Filled 2011-03-12: qty 1

## 2011-03-12 MED ORDER — ACETAMINOPHEN 325 MG PO TABS
650.0000 mg | ORAL_TABLET | Freq: Four times a day (QID) | ORAL | Status: DC | PRN
Start: 2011-03-12 — End: 2011-03-26
  Administered 2011-03-26: 650 mg via ORAL
  Filled 2011-03-12: qty 2

## 2011-03-12 MED ORDER — PANTOPRAZOLE SODIUM 40 MG PO TBEC: 40.0000 mg | DELAYED_RELEASE_TABLET | Freq: Every day | ORAL | Status: DC

## 2011-03-12 MED ORDER — GARLIC 1000 MG PO CAPS: 1000.0000 mg | ORAL_CAPSULE | Freq: Every day | ORAL | Status: DC

## 2011-03-12 MED ORDER — BUPROPION HCL ER (SR) 150 MG PO TB12
150.0000 mg | ORAL_TABLET | Freq: Two times a day (BID) | ORAL | Status: DC
  Administered 2011-03-13 – 2011-03-15 (×6): 150 mg via ORAL
  Filled 2011-03-12 (×9): qty 1

## 2011-03-12 MED ORDER — DOCUSATE SODIUM 100 MG PO CAPS
100.0000 mg | ORAL_CAPSULE | Freq: Two times a day (BID) | ORAL | Status: DC
  Administered 2011-03-13 – 2011-03-17 (×6): 100 mg via ORAL
  Filled 2011-03-12 (×12): qty 1

## 2011-03-12 MED ORDER — SENNA 8.6 MG PO TABS
1.0000 | ORAL_TABLET | Freq: Two times a day (BID) | ORAL | Status: DC
  Administered 2011-03-13 – 2011-03-15 (×6): 8.6 mg via ORAL
  Filled 2011-03-12 (×9): qty 1

## 2011-03-12 MED ORDER — ONDANSETRON HCL 4 MG/2ML IJ SOLN: 4.0000 mg | Freq: Four times a day (QID) | INTRAMUSCULAR | Status: DC | PRN

## 2011-03-12 MED ORDER — SODIUM CHLORIDE 0.9 % IV SOLN
INTRAVENOUS | Status: AC
  Administered 2011-03-12: 17:00:00 via INTRAVENOUS

## 2011-03-12 NOTE — Consult Note (Addendum)
Reason for Consult: "altered mental status with MS"  HPI: Terry Palmer is an 36 y.o. Male with advanced MS and quadriparesis who comes in with altered mental status. Per report, the patient is more lethargic than normal. This problem has been gradually worsening.   Past Medical History  Diagnosis Date  . MS (multiple sclerosis)   . UTI (urinary tract infection)   . Childhood asthma    Medications: I have reviewed the patient's current medications.  Past Surgical History  Procedure Date  . Lumbar puncture 10/12/2002   Family History  Problem Relation Age of Onset  . Asthma Mother    Social History:  reports that he quit smoking about 12 years ago. His smoking use included Cigarettes. He has quit using smokeless tobacco. He reports that he uses illicit drugs (Marijuana and Cocaine). He reports that he does not drink alcohol.  Allergies: No Known Allergies  ROS: as above  Blood pressure 129/85, pulse 92, temperature 98.2 F (36.8 C), temperature source Oral, resp. rate 16, SpO2 98.00%.  Neuro: MS: AAO*1, follows some simple commands, PERRL, EOMI, directional gaze nystagmus in all 4 directions, left facial droop, severe dysarthria, distal RUE 3/5, proximal RUE 2/5, distal LUE 3-/5, proximal LUE 2/5, distal RLE 3/5, proximal RLE 1/5, LLE distal 1/5, withdrew LLE to pain less, Sensory: intact to pain throughout, Coord: could not assess, Reflexes: 3+ in UE, 3+ KJ b/l, sustained clonus in AJ b/l, plantars withdrawal b/l  Results for orders placed during the hospital encounter of 03/12/11 (from the past 48 hour(s))  CBC     Status: Abnormal   Collection Time   03/12/11 12:51 PM      Component Value Range Comment   WBC 12.0 (*) 4.0 - 10.5 (K/uL)    RBC 4.47  4.22 - 5.81 (MIL/uL)    Hemoglobin 13.2  13.0 - 17.0 (g/dL)    HCT 16.1  09.6 - 04.5 (%)    MCV 87.2  78.0 - 100.0 (fL)    MCH 29.5  26.0 - 34.0 (pg)    MCHC 33.8  30.0 - 36.0 (g/dL)    RDW 40.9  81.1 - 91.4 (%)    Platelets  314  150 - 400 (K/uL)   DIFFERENTIAL     Status: Abnormal   Collection Time   03/12/11 12:51 PM      Component Value Range Comment   Neutrophils Relative 76  43 - 77 (%)    Neutro Abs 9.1 (*) 1.7 - 7.7 (K/uL)    Lymphocytes Relative 13  12 - 46 (%)    Lymphs Abs 1.6  0.7 - 4.0 (K/uL)    Monocytes Relative 8  3 - 12 (%)    Monocytes Absolute 0.9  0.1 - 1.0 (K/uL)    Eosinophils Relative 2  0 - 5 (%)    Eosinophils Absolute 0.3  0.0 - 0.7 (K/uL)    Basophils Relative 0  0 - 1 (%)    Basophils Absolute 0.0  0.0 - 0.1 (K/uL)   BASIC METABOLIC PANEL     Status: Normal   Collection Time   03/12/11 12:51 PM      Component Value Range Comment   Sodium 142  135 - 145 (mEq/L)    Potassium 4.2  3.5 - 5.1 (mEq/L)    Chloride 106  96 - 112 (mEq/L)    CO2 26  19 - 32 (mEq/L)    Glucose, Bld 98  70 - 99 (mg/dL)  BUN 13  6 - 23 (mg/dL)    Creatinine, Ser 1.61  0.50 - 1.35 (mg/dL)    Calcium 9.7  8.4 - 10.5 (mg/dL)    GFR calc non Af Amer >90  >90 (mL/min)    GFR calc Af Amer >90  >90 (mL/min)   URINALYSIS, ROUTINE W REFLEX MICROSCOPIC     Status: Abnormal   Collection Time   03/12/11  2:23 PM      Component Value Range Comment   Color, Urine YELLOW  YELLOW     APPearance CLOUDY (*) CLEAR     Specific Gravity, Urine 1.029  1.005 - 1.030     pH 6.0  5.0 - 8.0     Glucose, UA NEGATIVE  NEGATIVE (mg/dL)    Hgb urine dipstick MODERATE (*) NEGATIVE     Bilirubin Urine SMALL (*) NEGATIVE     Ketones, ur 15 (*) NEGATIVE (mg/dL)    Protein, ur NEGATIVE  NEGATIVE (mg/dL)    Urobilinogen, UA 0.2  0.0 - 1.0 (mg/dL)    Nitrite NEGATIVE  NEGATIVE     Leukocytes, UA SMALL (*) NEGATIVE    URINE MICROSCOPIC-ADD ON     Status: Abnormal   Collection Time   03/12/11  2:23 PM      Component Value Range Comment   WBC, UA 3-6  <3 (WBC/hpf)    RBC / HPF 11-20  <3 (RBC/hpf)    Bacteria, UA FEW (*) RARE     Urine-Other MUCOUS PRESENT      Ct Head Wo Contrast  03/12/2011  *RADIOLOGY REPORT*  Clinical Data:  Mental status changes.  Not eating.  Speech disturbance.  History multiple sclerosis.  CT HEAD WITHOUT CONTRAST  Technique:  Contiguous axial images were obtained from the base of the skull through the vertex without contrast.  Comparison: MRI 10/20/2010  Findings: the brain shows generalized atrophy.  There is abnormal low density throughout the hemispheric white matter consistent with the patient's known chronic demyelinating disease.  This does not appear to have grossly changed.  No sign of acute infarction, mass lesion, hemorrhage, hydrocephalus or extra-axial collection. Sinuses, middle ears and mastoids are clear.  No skull or skull base lesion.  IMPRESSION: Chronic white matter lesions in brain atrophy consistent with chronic multiple sclerosis.  No sign of any identifiable acute process.  Original Report Authenticated By: Thomasenia Sales, M.D.   Assessment/Plan: 36 years old man with advanced MS and quadriparesis who comes in with progressive lethargy  1) Please obtain more history and clarify patient's baseline as well as find out where he gets his neurological care 2) Infectious work-up 3) Please discuss goals of care with family as patient's MS appears to be advanced and he is severely debilitated. If family wants to be aggressive, MRI brain and C-spine with Gad can be obtained and than patient can get 5 days of 1G of solumedrol daily if MRI shows enhancing lesions.   4) Ultimately, steroid treatment will not make a significant difference in regards to future recovery and improvement in quality of life. Placement should be considered if it has not been considered already. Call with questions.   Lexxi Koslow 03/12/2011, 5:13 PM

## 2011-03-12 NOTE — ED Notes (Signed)
Pts mother to briefly leave hospital, please call mother, Royetta Crochet @ [phone] if plan of care changes

## 2011-03-12 NOTE — ED Provider Notes (Signed)
History     CSN: [id number]  Arrival date & time 03/12/11  1215   First MD Initiated Contact with Patient 03/12/11 1306      Chief Complaint  Patient presents with  . Failure To Thrive    (Consider location/radiation/quality/duration/timing/severity/associated sxs/prior treatment) Patient is a 36 y.o. male presenting with altered mental status. The history is provided by the patient. No language interpreter was used.  Altered Mental Status This is a recurrent problem. The current episode started today. The problem has been gradually worsening. Associated symptoms include fatigue and weakness. Pertinent negatives include no abdominal pain, anorexia, chest pain, chills, diaphoresis, fever, headaches, neck pain, sore throat, swollen glands, vertigo or vomiting.    Past Medical History  Diagnosis Date  . MS (multiple sclerosis)   . UTI (urinary tract infection)   . Childhood asthma     Past Surgical History  Procedure Date  . Lumbar puncture 10/12/2002    Family History  Problem Relation Age of Onset  . Asthma Mother     History  Substance Use Topics  . Smoking status: Former Smoker    Types: Cigarettes    Quit date: 02/02/1999  . Smokeless tobacco: Former Neurosurgeon  . Alcohol Use: No      Review of Systems  Unable to perform ROS Constitutional: Positive for fatigue. Negative for fever, chills and diaphoresis.  HENT: Negative for sore throat and neck pain.   Cardiovascular: Negative for chest pain.  Gastrointestinal: Negative for vomiting, abdominal pain and anorexia.  Neurological: Positive for weakness. Negative for vertigo and headaches.  Psychiatric/Behavioral: Positive for altered mental status.    Allergies  Review of patient's allergies indicates no known allergies.  Home Medications   Current Outpatient Rx  Name Route Sig Dispense Refill  . BACLOFEN 10 MG PO TABS Oral Take 20 mg by mouth 3 (three) times daily.     . BUPROPION HCL ER (SR) 150 MG PO TB12  Oral Take 150 mg by mouth 2 (two) times daily.    Marland Kitchen VITAMIN D 1000 UNITS PO TABS Oral Take 1,000 Units by mouth daily.      Marland Kitchen DOCUSATE SODIUM 100 MG PO CAPS Oral Take 100 mg by mouth 2 (two) times daily.    Marland Kitchen ENSURE CLINICAL ST REVIGOR PO LIQD Oral Take 237 mLs by mouth 3 (three) times daily with meals.    . OMEGA-3 FATTY ACIDS 1000 MG PO CAPS Oral Take 1,200 mg by mouth daily.      Marland Kitchen GARLIC 1000 MG PO CAPS Oral Take 1,000 mg by mouth daily.      . IBUPROFEN 200 MG PO TABS Oral Take 200 mg by mouth every 6 (six) hours as needed. For pain and fever    . INTERFERON BETA-1B 0.3 MG [place] SOLR Subcutaneous Inject 0.3 mg into the skin every other day. Next dose due 03/13/2011    . NITROFURANTOIN [place] MACRO 100 MG PO CAPS Oral Take 100 mg by mouth 2 (two) times daily.    Marland Kitchen PREDNISONE (PAK) 5 MG PO TABS Oral Take 15 mg by mouth daily.    . SENNA 8.6 MG PO TABS Oral Take 1 tablet (8.6 mg total) by mouth 2 (two) times daily. Hold for diarrhea. 60 each 0  . VITAMIN B-12 1000 MCG PO TABS Oral Take 1,000 mcg by mouth daily.     . CEPHALEXIN 500 MG PO CAPS Oral Take 1 capsule (500 mg total) by mouth 4 (four) times daily. 40 capsule 0  BP 113/75  Pulse 96  Temp(Src) 98.2 F (36.8 C) (Oral)  Resp 16  SpO2 98%  Physical Exam  Nursing note and vitals reviewed. Constitutional: He is oriented to person, place, and time. He appears well-developed and well-nourished.  HENT:  Head: Normocephalic and atraumatic.  Eyes: Pupils are equal, round, and reactive to light.  Neck: Neck supple.  Cardiovascular: Normal rate and regular rhythm.  Exam reveals no gallop and no friction rub.   No murmur heard. Pulmonary/Chest: Breath sounds normal. No respiratory distress.  Abdominal: Soft. He exhibits no distension.  Musculoskeletal: Normal range of motion.  Neurological: He is alert and oriented to person, place, and time. No cranial nerve deficit.  Skin: Skin is warm and dry.  Psychiatric: He has a normal mood and  affect.    ED Course  Procedures (including critical care time)  Labs Reviewed  CBC - Abnormal; Notable for the following:    WBC 12.0 (*)    All other components within normal limits  DIFFERENTIAL - Abnormal; Notable for the following:    Neutro Abs 9.1 (*)    All other components within normal limits  BASIC METABOLIC PANEL  URINALYSIS, ROUTINE W REFLEX MICROSCOPIC   No results found.   No diagnosis found.    MDM   Date: 03/12/2011  Rate: 97  Rhythm: normal sinus rhythm  QRS Axis: normal  Intervals: normal  ST/T Wave abnormalities: normal  Conduction Disutrbances:none  Narrative Interpretation:   Old EKG Reviewed: none available  36yo male with MS and quadriparesis coming in today with decreased LOC per family. Patient has been treated recently for a UTI. Patient has bacteria and blood in his urine presently with pain on urination. Family states that the patient bears down when he tries to pee. His indwelling catheter was removed one month ago. The urine shows bacteria and hematuria we will culture this. He will be admitted by the hospitalist for observation and we will rule out kidney stone in the process. Neurology will consult for his decreased LOC. Patient has been on a narcotic medication at home. Asked to a sternal rub earlier in the day. He arouses to a shout now Will be admitted to [facility].  Neuro was consulted as well.  CT abdomnen and pelvis to r/o stone added.       Jethro Bastos, NP 03/12/11 1712

## 2011-03-12 NOTE — H&P (Signed)
Terry Palmer MRN: [id number] DOB/AGE: 38-10-1975 38 y.o. Primary Care Physician:AVBUERE,EDWIN A, MD, MD Admit date: 03/12/2011 Chief Complaint: Altered mental status HPI:  Terry Palmer is a unfortunate 36 year old gentleman with a history of multiple sclerosis and quadriparesis bedbound, history of recurrent UTIs who presents to the ED with altered mental status. Unable to decipher this patient's speech as it sounds garbled at times and as such history was obtained from the ED PA.Marland Kitchen EDPA patient was difficult to arouse this morning per family with increased weakness and fatigue. Also it was stated that patient did cry out in pain whenever he tried to pee. Patient was brought to the ED and in the ED with you was known noted to have a leukocytosis of 12,000. Urinalysis which was done did show small leukocytes nitrite negative, moderate hemoglobin with 11-20 RBCs and 3-6 WBCs with a few bacteria. CT of the head which was done showed chronic white matter lesions in the brain atrophy consistent with chronic multiple sclerosis. Will call to admit the patient for further evaluation and management. On interview patient is alert.  Past Medical History  Diagnosis Date  . MS (multiple sclerosis)   . UTI (urinary tract infection)   . Childhood asthma   . Quadriparesis (muscle weakness) 03/12/2011    Past Surgical History  Procedure Date  . Lumbar puncture 10/12/2002    Prior to Admission medications   Medication Sig Start Date End Date Taking? Authorizing Provider  baclofen (LIORESAL) 10 MG tablet Take 20 mg by mouth 3 (three) times daily.    Yes Historical Provider, MD  buPROPion (WELLBUTRIN SR) 150 MG 12 hr tablet Take 150 mg by mouth 2 (two) times daily.   Yes Historical Provider, MD  cholecalciferol (VITAMIN D) 1000 UNITS tablet Take 1,000 Units by mouth daily.     Yes Historical Provider, MD  docusate sodium (COLACE) 100 MG capsule Take 100 mg by mouth 2 (two) times daily.   Yes Historical  Provider, MD  feeding supplement (ENSURE CLINICAL STRENGTH) LIQD Take 237 mLs by mouth 3 (three) times daily with meals. 02/05/11  Yes Srikar Cherlynn Kaiser, MD  fish oil-omega-3 fatty acids 1000 MG capsule Take 1,200 mg by mouth daily.     Yes Historical Provider, MD  Garlic 1000 MG CAPS Take 1,000 mg by mouth daily.     Yes Historical Provider, MD  ibuprofen (ADVIL,MOTRIN) 200 MG tablet Take 200 mg by mouth every 6 (six) hours as needed. For pain and fever   Yes Historical Provider, MD  interferon beta-1b (BETASERON) 0.3 MG injection Inject 0.3 mg into the skin every other day. Next dose due 03/13/2011   Yes Historical Provider, MD  nitrofurantoin, macrocrystal-monohydrate, (MACROBID) 100 MG capsule Take 100 mg by mouth 2 (two) times daily. 03/05/11 03/12/11 Yes Historical Provider, MD  predniSONE (STERAPRED UNI-PAK) 5 MG TABS Take 15 mg by mouth daily.   Yes Historical Provider, MD  senna (SENOKOT) 8.6 MG TABS Take 1 tablet (8.6 mg total) by mouth 2 (two) times daily. Hold for diarrhea. 02/05/11  Yes Srikar Cherlynn Kaiser, MD  vitamin B-12 (CYANOCOBALAMIN) 1000 MCG tablet Take 1,000 mcg by mouth daily.    Yes Historical Provider, MD  cephALEXin (KEFLEX) 500 MG capsule Take 1 capsule (500 mg total) by mouth 4 (four) times daily. 03/01/11 03/11/11  Izola Price. [place], [place]    Allergies: No Known Allergies  Family History  Problem Relation Age of Onset  . Asthma Mother     Social History:  reports that he quit smoking about 12 years ago. His smoking use included Cigarettes. He has quit using smokeless tobacco. He reports that he uses illicit drugs (Marijuana and Cocaine). He reports that he does not drink alcohol.  ROS: All systems reviewed with the patient and was positive as per HPI otherwise all other systems are negative.  PHYSICAL EXAM: Blood pressure 129/85, pulse 92, temperature 98.2 F (36.8 C), temperature source Oral, resp. rate 16, SpO2 98.00%. General: Well-developed well-nourished in no acute  cardiopulmonary distress. Patient is alert. With some garbled speech.  HEENT: Normocephalic atraumatic. Pupils equal round and reactive to light and accommodation. Oropharynx is clear with no lesions has some food particles in there. Neck is supple with no lymphadenopathy. No bruits, no goiter. Heart: Regular rate and rhythm, without murmurs, rubs, gallops. Lungs: Clear to auscultation bilaterally. Abdomen: Soft, nontender, nondistended, positive bowel sounds. Extremities: No clubbing cyanosis or edema with positive pedal pulses. Neuro: Patient is alert quadriparesis with some mild contractures of the lower extremities. Patient moving extremities somewhat spontaneously. Unable to do the rest of the neurological exam.    EKG: None.  No results found for this or any previous visit (from the past 240 hour(s)).   Lab results:  Basename 03/12/11 1251  NA 142  K 4.2  CL 106  CO2 26  GLUCOSE 98  BUN 13  CREATININE 0.98  CALCIUM 9.7  MG --  PHOS --   No results found for this basename: AST:2,ALT:2,ALKPHOS:2,BILITOT:2,PROT:2,ALBUMIN:2 in the last 72 hours No results found for this basename: LIPASE:2,AMYLASE:2 in the last 72 hours  Basename 03/12/11 1251  WBC 12.0*  NEUTROABS 9.1*  HGB 13.2  HCT 39.0  MCV 87.2  PLT 314   No results found for this basename: CKTOTAL:3,CKMB:3,CKMBINDEX:3,TROPONINI:3 in the last 72 hours No components found with this basename: POCBNP:3 No results found for this basename: DDIMER in the last 72 hours No results found for this basename: HGBA1C:2 in the last 72 hours No results found for this basename: CHOL:2,HDL:2,LDLCALC:2,TRIG:2,CHOLHDL:2,LDLDIRECT:2 in the last 72 hours No results found for this basename: TSH,T4TOTAL,FREET3,T3FREE,THYROIDAB in the last 72 hours No results found for this basename: VITAMINB12:2,FOLATE:2,FERRITIN:2,TIBC:2,IRON:2,RETICCTPCT:2 in the last 72 hours Imaging results:  Ct Abdomen Pelvis Wo Contrast  03/12/2011  *RADIOLOGY  REPORT*  Clinical Data: Microscopic hematuria.  CT ABDOMEN AND PELVIS WITHOUT CONTRAST  Technique:  Multidetector CT imaging of the abdomen and pelvis was performed following the standard protocol without intravenous contrast.  Comparison: None.  Findings: Asymmetric elevation of the left hemidiaphragm noted.  No focal abnormalities seen in the liver or spleen on this study performed without intravenous contrast material.  The stomach, the duodenum, pancreas, gallbladder, and adrenal glands are unremarkable.  No stones are seen in either kidney.  There is no hydronephrosis. No secondary changes in either kidney.  No abdominal aortic aneurysm.  No free fluid or lymphadenopathy in the abdomen.  The abdominal bowel loops are unremarkable.  Imaging through the pelvis shows a prominent stool volume in the rectal vault.  No intraperitoneal free fluid.  Multiple stones are seen in the bladder lumen, measuring up to 1.5 cm in diameter.  No pelvic sidewall lymphadenopathy.  There is some abnormal edema in the presacral soft tissues of indeterminate etiology.  The terminal ileum and the appendix are normal in appearance.  Bone windows reveal no worrisome lytic or sclerotic osseous lesions.  IMPRESSION: No renal or ureteral calculi.  No secondary changes in either kidney.  Multiple bladder stones are evident.  Original Report Authenticated By: ERIC A. MANSELL, M.D.   Ct Head Wo Contrast  03/12/2011  *RADIOLOGY REPORT*  Clinical Data: Mental status changes.  Not eating.  Speech disturbance.  History multiple sclerosis.  CT HEAD WITHOUT CONTRAST  Technique:  Contiguous axial images were obtained from the base of the skull through the vertex without contrast.  Comparison: MRI 10/20/2010  Findings: the brain shows generalized atrophy.  There is abnormal low density throughout the hemispheric white matter consistent with the patient's known chronic demyelinating disease.  This does not appear to have grossly changed.  No sign of  acute infarction, mass lesion, hemorrhage, hydrocephalus or extra-axial collection. Sinuses, middle ears and mastoids are clear.  No skull or skull base lesion.  IMPRESSION: Chronic white matter lesions in brain atrophy consistent with chronic multiple sclerosis.  No sign of any identifiable acute process.  Original Report Authenticated By: Thomasenia Sales, M.D.   Dg Chest Portable 1 View  03/12/2011  *RADIOLOGY REPORT*  Clinical Data: Multiple sclerosis.  Failure to thrive.  PORTABLE CHEST - 1 VIEW  Comparison: 12/27/2010  Findings: 1715 hours.  Lung volumes are low. The lungs are clear without focal infiltrate, edema, pneumothorax or pleural effusion. The cardiopericardial silhouette is within normal limits for size. Imaged bony structures of the thorax are intact. Telemetry leads overlie the chest.  IMPRESSION: Low volume film without acute findings.  Original Report Authenticated By: ERIC A. MANSELL, M.D.   Impression/Plan:  Principal Problem:  *Encephalopathy Active Problems:  Multiple sclerosis exacerbation  Leukocytosis  Weakness generalized  Hematuria  Quadriparesis (muscle weakness)   #1 encephalopathy Questionable etiology. Patient is currently alert. No family at bedside to know his baseline. Head CT which was done was negative. Patient does have a leukocytosis. Urinalysis which was done did show small leukocytes negative nitrite and moderate RBCs. Patient was recently treated for urinary tract infection and finished antibiotics one day prior to admission. Will admit the patient. Chest x-ray i is negative. Will hydrate with IV fluids. Repeat chest x-ray in the morning to rule out a blood in pneumonia. And follow. If no improvement consider MRI and neuro evaluation as patient does have a history of MS.  #2 leukocytosis Questionable etiology. Patient is however on prednisone. Urinalysis is nitrite negative, small leukocytes and moderate blood. We'll get a CT of the abdomen and pelvis  stone protocol. Monitor and follow. Chest x-ray was negative. We'll repeat chest x-ray in the morning. We'll off on antibiotics at this time.  #3 hematuria We'll get a CT of the abdomen and pelvis stone protocol.  #4 multiple sclerosis/quadriparesis Stable continue home regimen of baclofen prednisone.  #5 prophylaxis SCDs for DVT prophylaxis. Protonix for GI prophylaxis.   Cherryl Babin,[place] 03/12/2011, 5:59 PM

## 2011-03-12 NOTE — ED Notes (Signed)
Anne, NP at pt bedside.   

## 2011-03-12 NOTE — ED Notes (Signed)
Pt not eating x 2 days and speaking with a weak voice. Seen at [facility] 2 weeks and put on antibiotics.History of MS.

## 2011-03-12 NOTE — ED Notes (Signed)
CALLED DR THOMPSON ABOUT HIS ORDERS. SPOKE WITH HIM ABOUT PT NPO STATUS AND THE PO MEDS ORDERED. HE SEEMED UPSET THAT THIS ORDER QUESTIONED BUT NOW WANTS A STAT SPEECH SWALLOW EVAL FOR PT. HE DOES NOT UNDERSTAND THAT WE MUST HAVE AN ORDER TO GIVE PT PO MEDS WHEN NPO STATUS.

## 2011-03-12 NOTE — ED Notes (Signed)
Patient transported to CT 

## 2011-03-13 ENCOUNTER — Inpatient Hospital Stay (HOSPITAL_COMMUNITY): Payer: Medicare HMO

## 2011-03-13 ENCOUNTER — Encounter (HOSPITAL_COMMUNITY): Payer: Self-pay | Admitting: General Practice

## 2011-03-13 LAB — CBC
HCT: 34.4 % — ABNORMAL LOW (ref 39.0–52.0)
MCH: 29.5 pg (ref 26.0–34.0)
MCHC: 34 g/dL (ref 30.0–36.0)
MCV: 86.9 fL (ref 78.0–100.0)
Platelets: 296 10*3/uL (ref 150–400)
RDW: 13.6 % (ref 11.5–15.5)

## 2011-03-13 LAB — BASIC METABOLIC PANEL
BUN: 13 mg/dL (ref 6–23)
CO2: 21 mEq/L (ref 19–32)
Calcium: 9.1 mg/dL (ref 8.4–10.5)
Creatinine, Ser: 0.73 mg/dL (ref 0.50–1.35)
Glucose, Bld: 114 mg/dL — ABNORMAL HIGH (ref 70–99)

## 2011-03-13 LAB — URINE CULTURE: Culture: NO GROWTH

## 2011-03-13 LAB — DIFFERENTIAL
Basophils Absolute: 0 10*3/uL (ref 0.0–0.1)
Eosinophils Absolute: 0 10*3/uL (ref 0.0–0.7)
Eosinophils Relative: 0 % (ref 0–5)
Monocytes Absolute: 0.4 10*3/uL (ref 0.1–1.0)

## 2011-03-13 MED ORDER — DIPHENHYDRAMINE HCL 25 MG PO CAPS
25.0000 mg | ORAL_CAPSULE | Freq: Four times a day (QID) | ORAL | Status: DC | PRN
  Administered 2011-03-13: 25 mg via ORAL
  Filled 2011-03-13: qty 1

## 2011-03-13 MED ORDER — ENSURE CLINICAL ST REVIGOR PO LIQD
237.0000 mL | Freq: Three times a day (TID) | ORAL | Status: DC
  Administered 2011-03-13 – 2011-03-15 (×6): 237 mL via ORAL

## 2011-03-13 MED ORDER — MINERAL OIL RE ENEM
1.0000 | ENEMA | Freq: Once | RECTAL | Status: AC
  Administered 2011-03-13: 1 via RECTAL
  Filled 2011-03-13: qty 1

## 2011-03-13 MED ORDER — FOOD THICKENER (THICKENUP CLEAR)
ORAL | Status: DC | PRN
  Filled 2011-03-13: qty 120

## 2011-03-13 MED ORDER — ADULT MULTIVITAMIN W/MINERALS CH
1.0000 | ORAL_TABLET | Freq: Every day | ORAL | Status: DC
  Administered 2011-03-13 – 2011-03-15 (×3): 1 via ORAL
  Filled 2011-03-13 (×4): qty 1

## 2011-03-13 NOTE — Progress Notes (Signed)
PT Note  Order received.  According to wife pt has been bed bound for quite some time.  No PT eval performed.  [employer] PT [phone]

## 2011-03-13 NOTE — Progress Notes (Signed)
Utilization Review Completed.  Shalena Ezzell T  03/13/2011  

## 2011-03-13 NOTE — Procedures (Signed)
FEES- Fiberoptic Endoscopic Evaluation of Swallowing Procedure Note Patient Details  Name: Terry Palmer MRN: [id number] Date of Birth: 1975/05/27  Today's Date: 03/13/2011 Past Medical History:  Past Medical History  Diagnosis Date  . MS (multiple sclerosis)   . UTI (urinary tract infection)   . Childhood asthma   . 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 Neuromuscular disorder     Quadraperesis  . Depression    Past Surgical History:  Past Surgical History  Procedure Date  . Lumbar puncture 10/12/2002   HPI:  36 y.o. male with h/o multiple sclerosis, quadraparesis, bedbound, and recurrent UTIs who presented on 03/12/11 with AMS due to UTI, leukocytosis and encephalopathy.  Wife reports that he has been mostly non-verbal over the last 2 months, has a weak cough and shows signs of aspiration with thin liquids for about 3 weeks at home.  Chest x-ray on 3/4 showed no acute pulmonary findings.     Recommendation/Prognosis  Clinical Impression Dysphagia Diagnosis: Moderate oral phase dysphagia;Mild pharyngeal phase dysphagia Clinical impression: Demonstrates both an oral and pharyngeal phase dysphagia marked by generalized weakness of all swallow musculature, particularly with base of tongue retraction, lending to premature spillage of oral bolus into the pharynx (reaching the pyriform sinuses) before the swallow trigger.  Immediate sensed aspiration of thin liquid trials 50% of the time and unclear if patient was able to fully expell the aspirate from the trachea.  Nectar-thick liquids were judged to be the safest for consumption as sensed aspiration of thin liquids creates a very debilitating cough response that requires time for patient to regain his breath.  Patient and wife agreeable to a modified diet during this acute illness with the plan for Home Health SLP services to facilitate treatment for return to thin liquids.  Swallow Evaluation Recommendations Solid Consistency:  Dysphagia 1 (Puree) Liquid Consistency: Thin Liquid Administration via: Cup Medication Administration: Crushed with puree Supervision: Staff feed patient;Full supervision/cueing for compensatory strategies Postural Changes and/or Swallow Maneuvers: Seated upright 90 degrees;Upright 30-60 min after meal (Assist with head posture during feeding)  Follow up Recommendations: Home health SLP Prognosis Prognosis for Safe Diet Advancement: Fair Individuals Consulted Consulted and Agree with Results and Recommendations: Patient;Family member/caregiver;MD;RN Family Member Consulted: wife  SLP Goals  SLP Swallowing Goals Patient will consume recommended diet without observed clinical signs of aspiration with: Minimal assistance Patient will utilize recommended strategies during swallow to increase swallowing safety with: Minimal assistance Patient will direct his care and caregivers during functional feeding to minimize aspiration risk with minimal cues.   Myra Rude, M.S.,CCC-SLP Pager [phone] 03/13/2011, 2:38 PM

## 2011-03-13 NOTE — Progress Notes (Signed)
Spoke to mother and wife at bedside who both stated that they would like a urology consult for Terry Palmer. He has been having increased occurences of UTIs and strains when urinating ("like he is having a baby"). They also talked with their home health RN about why he is having slurred speech and blank stares at times. They "googled" it and saw that it may be due to seizures. They were wondering about an EEG as well. Will pass the word along to Dr. Sharl Ma.

## 2011-03-13 NOTE — Progress Notes (Signed)
OT Note:  Spoke with patient's family (mother and wife) who state patient was receiving HHOT PTA. Mother and wife are comfortable with performing various stretches and exercises with patient here in the hospital. Recommend for patient to resume HH therapies after d/c from hospital if that is the patient and family's wish. Will sign off acutely. Thank you!  Glendale Chard, OTR/L Pager: [phone] 03/13/2011

## 2011-03-13 NOTE — Consult Note (Signed)
Urology Consult  CC: Bladder spasms, hematuria, difficult urination  HPI: 36 year old male with a several year history of multiple sclerosis, with significant progression. He has been on immune therapy, which was stopped within the past few months. The patient is admitted for deterioration of his neurologic condition as well as possible encephalopathy.  Apparently, for several months, he has been having recurrent urinary tract infections, about monthly. These infections have been treated with oral antibiotics. They have not been associated with fever, abdominal pain, or gross hematuria. Prior to several months ago, he does not have a long-standing history of these infections. He has not had a see a urologist before.  For quite some time, he has also had to strain with urination. There is not a significant amount of leakage, but the patient apparently does have to wear diapers. He is cared for at home usually by his mother and his wife. There is not a history of kidney stones in the past.   Urologic evaluation/consultation is requested for the patient's microscopic hematuria and his urinary situation. According to the patient's mother, he is not able to be transported to any physicians office.  PMH: Past Medical History  Diagnosis Date  . MS (multiple sclerosis)   . UTI (urinary tract infection)   . Childhood asthma   . 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 Neuromuscular disorder     Quadraperesis  . Depression     PSH: Past Surgical History  Procedure Date  . Lumbar puncture 10/12/2002    Allergies: No Known Allergies  Medications: Prescriptions prior to admission  Medication Sig Dispense Refill  . baclofen (LIORESAL) 10 MG tablet Take 20 mg by mouth 3 (three) times daily.       Marland Kitchen buPROPion (WELLBUTRIN SR) 150 MG 12 hr tablet Take 150 mg by mouth 2 (two) times daily.      . cholecalciferol (VITAMIN D) 1000 UNITS tablet Take 1,000 Units by mouth daily.        Marland Kitchen docusate  sodium (COLACE) 100 MG capsule Take 100 mg by mouth 2 (two) times daily.      . feeding supplement (ENSURE CLINICAL STRENGTH) LIQD Take 237 mLs by mouth 3 (three) times daily with meals.      . fish oil-omega-3 fatty acids 1000 MG capsule Take 1,200 mg by mouth daily.        . Garlic 1000 MG CAPS Take 1,000 mg by mouth daily.        Marland Kitchen ibuprofen (ADVIL,MOTRIN) 200 MG tablet Take 200 mg by mouth every 6 (six) hours as needed. For pain and fever      . interferon beta-1b (BETASERON) 0.3 MG injection Inject 0.3 mg into the skin every other day. Next dose due 03/13/2011      . nitrofurantoin, macrocrystal-monohydrate, (MACROBID) 100 MG capsule Take 100 mg by mouth 2 (two) times daily.      . predniSONE (STERAPRED UNI-PAK) 5 MG TABS Take 15 mg by mouth daily.      Marland Kitchen senna (SENOKOT) 8.6 MG TABS Take 1 tablet (8.6 mg total) by mouth 2 (two) times daily. Hold for diarrhea.  60 each  0  . vitamin B-12 (CYANOCOBALAMIN) 1000 MCG tablet Take 1,000 mcg by mouth daily.       . cephALEXin (KEFLEX) 500 MG capsule Take 1 capsule (500 mg total) by mouth 4 (four) times daily.  40 capsule  0     Social History: History   Social History  . Marital Status:  Married    Spouse Name: N/A    Number of Children: N/A  . Years of Education: N/A   Occupational History  . Not on file.   Social History Main Topics  . Smoking status: Former Smoker    Types: Cigarettes    Quit date: 02/02/1999  . Smokeless tobacco: Former Neurosurgeon  . Alcohol Use: No  . Drug Use: Yes    Special: Marijuana, Cocaine     Former use  . Sexually Active: No   Other Topics Concern  . Not on file   Social History Narrative   The patient currently lives at home with his wife and kids, who help him with his ADL's.  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  The patient currently has [facility], and is in the process of reapplying for Medicaid (12/2010). Ole Lafon (cell) [phone]; (home) [phone],  Makenzie Vittorio (cell) [phone] (home) [phone]    Family History: Family History  Problem Relation Age of Onset  . Asthma Mother     Review of Systems: Positive: Straining to urinate, slow urinary stream, recurring infections, significant progression of neurologic condition secondary to his multiple sclerosis Negative: .  A further 10 point review of systems was negative except what is listed in the HPI.  Physical Exam: @VITALS2 @ General: No acute distress.  Awake. He is alert but noncommunicative Head:  Normocephalic.  Atraumatic. Temporal wasting was present ENT:  EOMI.  Mucous membranes dry   Abdomen: Soft.  Mildly tender to palpation in the right lower quadrant. There was a mobile mass present, most likely secondary to a colon filled with stool. No peritoneal signs were noted Skin:  Normal turgor.  No visible rash. Extremity: No gross deformity of bilateral upper extremities.  No gross deformity of    bilateral lower extremities. Neurologic: Alert. Appropriate mood.  Penis:  circumcised.  No lesions. Urethra: Condom catheter in place.  Orthotopic meatus. Scrotum: No lesions.  No ecchymosis.  No erythema. Testicles: Descended bilaterally.  No masses bilaterally. Epididymis: Palpable bilaterally.  Non Tender to palpation.  Studies:  Recent Labs  Basename 03/13/11 0500 03/12/11 1251   HGB 11.7* 13.2   WBC 11.3* 12.0*   PLT 296 314    Recent Labs  Basename 03/13/11 0500 03/12/11 1251   NA 143 142   K 3.7 4.2   CL 110 106   CO2 21 26   BUN 13 13   CREATININE 0.73 0.98   CALCIUM 9.1 9.7   GFRNONAA >90 >90   GFRAA >90 >90     Recent Labs  Basename 03/12/11 2054 03/12/11 1850   INR -- 1.09   APTT 30 --     No components found with this basename: ABG:2  I reviewed the patient's CT urogram. This revealed several stones within the bladder layering posteriorly. Kidneys appear normal, without  hydronephrosis, perinephric edema or scarring. Prostate appears small, with some calcifications.  Assessment:   1. History of recurring urinary tract infections. No doubt, this is related to the patient's current bladder situation-with his multiple sclerosis, there is a fairly good chance that he is not emptying out completely, which would lead to his infections. Additionally, there are several stones which are not painful for him, but may be predisposing to urinary tract infections.  2. Bladder calculi. These may be infections stones. The past 3 urine cultures that grew stenotrophomonas maltophilia, Proteus and enterococcus. At the  very least, Proteus could be colonized in the stone  3. 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 This is a difficult situation in this young man with progressive multiple sclerosis. His bladder does not seem distended on the CT scan. I do think, more than likely, he needs assistance with bladder emptying. Additionally, he has a significant impaction, and I think that this may be adding to the lack of proper bladder emptying.  Plan: 1. I spoke with the patient's mother today. I outlined a plan-either get him on regular in and out catheterizations, at least twice a day (the patient does not have a significant oral intake of free water, so I do not think he will be making large amounts of urine), or leave a Foley catheter in. At this point, I think we should start every 8 hours in and out catheterization. In between, he can have the condom catheter left on . His mother is not very confident that even twice a day in and out catheterizations can be done  2. Regarding the patient's impaction, I have ordered a an enema. Because of his low free water intake, I think constipation and impaction is could be a regular issue in this man, making his urinary situation worse  3. More than likely, the patient's microscopic hematuria secondary to his stones.  4. Because of his debilitated state, I do  not think he is a candidate for stone management i.e. operative/cystoscopic extraction.  5. If a Foley catheter is left in, I think it would be worthwhile to start him on dilute acetic acid irrigation (0.025%) once or twice a day-this will decrease bacterial colonization  6. At this point, I do not have a significant amount more to offer. [name] mother says that he cannot be seen as an outpatient due to lack of mobility. I would see about working with her and the family about possibly taking them in and out catheterization or providing home health care that can do this. If not, I think it worthwhile just to leave the Foley in.      Pager:[phone]

## 2011-03-13 NOTE — Evaluation (Signed)
Clinical/Bedside Swallow Evaluation Patient Details  Name: Terry Palmer MRN: [id number] DOB: [birth year] Today's Date: 03/13/2011  Past Medical History:  Past Medical History  Diagnosis Date  . MS (multiple sclerosis)   . UTI (urinary tract infection)   . Childhood asthma   . 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 Neuromuscular disorder     Quadraperesis  . Depression    Past Surgical History:  Past Surgical History  Procedure Date  . Lumbar puncture 10/12/2002   HPI:  36 y.o. male with h/o multiple sclerosis, quadraparesis, bedbound, and recurrent UTIs who presented on 03/12/11 with AMS due to UTI, leukocytosis and encephalopathy.  Wife reports that he has been mostly non-verbal over the last 2 months, has a weak cough and shows signs of aspiration with thin liquids for about 3 weeks at home.  Chest x-ray on 3/4 showed no acute pulmonary findings.   Assessment/Recommendations/Treatment Plan    SLP Assessment Clinical Impression Statement: Demonstrates clinical indicators of both an oral and suspected pharyngeal phase dysphagia with consistent prolonged oral transit, ataxic oral movements as well as a suspected delay in swallow trigger with immediate coughing with thin liquid trials 75% of the time.  Long discussion with wife and patient regarding options.  Patient has been likely aspirating PTA with no pulmonary consequence at this time as well as clarification of their goals, which the wife stated was his quality of life.  Suggested a Palliative Care consult as a medium to determine what he wants in terms of care from this point forward and wife/patient agreed.  We decided to proceed with a FEES to objectively assess his swallow function and determine his true aspiration risk.  Dr. Sharl Ma called and discussed these suggestions. Risk for Aspiration: Severe Other Related Risk Factors: History of dysphagia (Quadraparesis)  Swallow Evaluation Recommendations Recommended Consults:  FEES General Recommendation: NPO except meds (Crush meds in puree) Medication Administration: Crushed with puree Supervision: Staff feed patient Oral Care Recommendations: Staff/trained caregiver to provide oral care;Oral care QID Recommendations for Other Services: Other (Comment) (Palliative Care) Follow up Recommendations: Home health SLP  Treatment Plan Treatment Plan Recommendations: Defer treatment plan to SLP at (Comment) (Pending FEES)    Myra Rude, M.S.,CCC-SLP Pager [phone] 03/13/2011,10:32 AM

## 2011-03-13 NOTE — Progress Notes (Addendum)
INITIAL ADULT NUTRITION ASSESSMENT Date: 03/13/2011   Time: 2:13 PM Reason for Assessment: Consult, dysphgia  ASSESSMENT: Male 36 y.o.  Dx: Encephalopathy  Hx:  Past Medical History  Diagnosis Date  . MS (multiple sclerosis)   . UTI (urinary tract infection)   . Childhood asthma   . 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 Neuromuscular disorder     Quadraperesis  . Depression     Related Meds:     . sodium chloride   Intravenous STAT  . baclofen  20 mg Oral TID  . buPROPion  150 mg Oral BID  . docusate sodium  100 mg Oral BID  . interferon beta-1b  0.3 mg Subcutaneous QODAY  . methylPREDNISolone (SOLU-MEDROL) injection  60 mg Intravenous Once  . omega-3 acid ethyl esters  1 g Oral Daily  . pantoprazole (PROTONIX) IV  40 mg Intravenous Q24H  . predniSONE  15 mg Oral Q breakfast  . senna  1 tablet Oral BID  . sodium chloride  1,000 mL Intravenous Once  . vitamin B-12  1,000 mcg Oral Daily  . DISCONTD: fish oil-omega-3 fatty acids  1,000 mg Oral Daily  . DISCONTD: Garlic  1,000 mg Oral Daily  . DISCONTD: pantoprazole  40 mg Oral Q0600  . DISCONTD: predniSONE  30 mg Oral Once  . DISCONTD: predniSONE  15 mg Oral Daily     Ht: 6\' 4"  (193 cm)  Wt: 152 lb 12.5 oz (69.3 kg)  Ideal Wt:  91.8 kg % Ideal Wt: 75%  Usual Wt: 84.1 kg  In August per patients family reports Wt Readings from Last 3 Encounters:  03/13/11 152 lb 12.5 oz (69.3 kg)  02/01/11 155 lb 13.8 oz (70.7 kg)  12/26/10 166 lb 14.4 oz (75.705 kg)  % usual weight: 82%  Body mass index is 18.60 kg/(m^2). underweight  Food/Nutrition Related Hx: Increased dysphagia for several months  Labs:  CMP     Component Value Date/Time   NA 143 03/13/2011 0500   K 3.7 03/13/2011 0500   CL 110 03/13/2011 0500   CO2 21 03/13/2011 0500   GLUCOSE 114* 03/13/2011 0500   BUN 13 03/13/2011 0500   CREATININE 0.73 03/13/2011 0500   CALCIUM 9.1 03/13/2011 0500   PROT 6.5 03/12/2011 2054   ALBUMIN 3.1* 03/12/2011 2054   AST 23  03/12/2011 2054   ALT 18 03/12/2011 2054   ALKPHOS 98 03/12/2011 2054   BILITOT 0.4 03/12/2011 2054   GFRNONAA >90 03/13/2011 0500   GFRAA >90 03/13/2011 0500     Intake/Output Summary (Last 24 hours) at 03/13/11 1419 Last data filed at 03/13/11 0900  Gross per 24 hour  Intake    705 ml  Output      0 ml  Net    705 ml     Diet Order: NPO  Supplements/Tube Feeding: none  IVF:    sodium chloride Last Rate: 100 mL/hr at 03/13/11 1312    Estimated Nutritional Needs:   Kcal: 1610-9604  Protein: 80-100 gm Fluid: 2-2.2 L  Patient was seen by SLP who recommended a FEES and NPO except crushed meds and palliative consult. RD spoke with patients wife and mother who provided a nutrition hx. Patient has been losing weight since about August. Patient has had trouble with swallowing, per family it would take him an hour to eat half a sandwich. Started trying to provide Ensure 3 times a day, but it would run back out of his mouth. Based on this, patient  has been eating less then 50% of his needs for greater then one month. Patients weight loss is 17.8% in less then one year. Patient meets criteria for severe malnutrition in the context of chronic illness 2/2 to the above mentioned characteristics.  RD also spoke with family about patient being able to meet nutrition needs. Family is open to other feeding options if patient is unable to meet nutrition needs through PO intake.  Patient has multiple stage II pressure ulcers.   NUTRITION DIAGNOSIS: -Inadequate oral intake (NI-2.1).  Status: Ongoing  RELATED TO: swallowing difficulty  AS EVIDENCE BY: weight loss, 17.8% in less then one year  MONITORING/EVALUATION(Goals): Goal: PO intake of meals and supplements will meet >90% of estimated nutrition needs Monitor: PO intake, weights, labs, Goals of Care  EDUCATION NEEDS: -No education needs identified at this time  INTERVENTION: 1. Ensure Clinical Strength TID (thicken to appropriate consistency  per SLP recommendations) 2. RD will continue to follow  3. Multivitamin added  Dietitian [phone]  DOCUMENTATION CODES Per approved criteria  -Severe malnutrition in the context of chronic illness -Underweight    KOWALSKI, Alwyn Cordner MARIE 03/13/2011, 2:13 PM

## 2011-03-13 NOTE — Progress Notes (Signed)
   CARE MANAGEMENT NOTE 03/13/2011  Patient:  Terry Palmer, Terry Palmer   Account Number:  [id number]  Date Initiated:  03/13/2011  Documentation initiated by:  Junius Creamer  Subjective/Objective Assessment:   adm w uti, hx mult sclerosis     Action/Plan:   lives w wife, pcp dr Fleet Contras, [facility]   Anticipated DC Date:  03/15/2011   Anticipated DC Plan:  HOME W HOME HEALTH SERVICES      DC Planning Services  CM consult      [facility] Choice  Resumption Of Svcs/PTA Provider   Choice offered to / List presented to:          [facility] arranged  HH-1 RN      [facility] agency  [facility]   Status of service:   Medicare Important Message given?   (If response is "NO", the following Medicare IM given date fields will be blank) Date Medicare IM given:   Date Additional Medicare IM given:    Discharge Disposition:  HOME W HOME HEALTH SERVICES  Per UR Regulation:    Comments:  3-5 act w [facility]. alerted mary [facility] who will follow along until disch. debbie Blong Busk rn,bsn [phone]

## 2011-03-13 NOTE — ED Provider Notes (Signed)
Medical screening examination/treatment/procedure(s) were performed by non-physician practitioner and as supervising physician I was immediately available for consultation/collaboration.   Brittley Regner, MD 03/13/11 0849 

## 2011-03-13 NOTE — Progress Notes (Signed)
Subjective: Patient seen and examined, admitted to the hospital with altered mental status , now back to the baseline.   Objective: Vital signs in last 24 hours: Temp:  [97.1 F (36.2 C)-98.6 F (37 C)] 97.1 F (36.2 C) (03/05 1400) Pulse Rate:  [91-97] 91  (03/05 1400) Resp:  [15-18] 18  (03/05 1400) BP: (109-130)/(70-85) 109/72 mmHg (03/05 1400) SpO2:  [95 %-99 %] 99 % (03/05 1400) Weight:  [67.6 kg (149 lb 0.5 oz)-69.3 kg (152 lb 12.5 oz)] 69.3 kg (152 lb 12.5 oz) (03/05 0612) Weight change:  Last BM Date: 03/12/11  Intake/Output from previous day: 03/04 0701 - 03/05 0700 In: 705 [I.V.:705] Out: -      Physical Exam: HEENT: Atraumatic, normocephalic Neck: Supple Chest : Clear to auscultation bilaterally, no wheezing, no crackles Heart : S1 S2 Regular, no murmurs Abdomen: Soft, Nontender, no organomegaly Ext : No cyanosis, clubbing, edema   Lab Results: Basic Metabolic Panel:  Basename 03/13/11 0500 03/12/11 1850 03/12/11 1251  NA 143 -- 142  K 3.7 -- 4.2  CL 110 -- 106  CO2 21 -- 26  GLUCOSE 114* -- 98  BUN 13 -- 13  CREATININE 0.73 -- 0.98  CALCIUM 9.1 -- 9.7  MG -- 2.6* --  PHOS -- -- --   Liver Function Tests:  [facility] 03/12/11 2054  AST 23  ALT 18  ALKPHOS 98  BILITOT 0.4  PROT 6.5  ALBUMIN 3.1*   No results found for this basename: LIPASE:2,AMYLASE:2 in the last 72 hours No results found for this basename: AMMONIA:2 in the last 72 hours CBC:  Basename 03/13/11 0500 03/12/11 1251  WBC 11.3* 12.0*  NEUTROABS 9.9* 9.1*  HGB 11.7* 13.2  HCT 34.4* 39.0  MCV 86.9 87.2  PLT 296 314   Coagulation:  Basename 03/12/11 1850  LABPROT 14.3  INR 1.09   Urine Drug Screen: Drugs of Abuse  No results found for this basename: labopia, cocainscrnur, labbenz, amphetmu, thcu, labbarb    Alcohol Level: No results found for this basename: ETH:2 in the last 72 hours Urinalysis:  Basename 03/12/11 1423  COLORURINE YELLOW  LABSPEC 1.029  PHURINE  6.0  GLUCOSEU NEGATIVE  HGBUR MODERATE*  BILIRUBINUR SMALL*  KETONESUR 15*  PROTEINUR NEGATIVE  UROBILINOGEN 0.2  NITRITE NEGATIVE  LEUKOCYTESUR SMALL*   Misc. Labs:  Recent Results (from the past 240 hour(s))  URINE CULTURE     Status: Normal   Collection Time   03/12/11  2:23 PM      Component Value Range Status Comment   Specimen Description URINE, RANDOM   Final    Special Requests ADDED [id number]   Final    Culture  Setup Time [id number]   Final    Colony Count NO GROWTH   Final    Culture NO GROWTH   Final    Report Status 03/13/2011 FINAL   Final     Studies/Results: Ct Abdomen Pelvis Wo Contrast  03/12/2011  *RADIOLOGY REPORT*  Clinical Data: Microscopic hematuria.  CT ABDOMEN AND PELVIS WITHOUT CONTRAST  Technique:  Multidetector CT imaging of the abdomen and pelvis was performed following the standard protocol without intravenous contrast.  Comparison: None.  Findings: Asymmetric elevation of the left hemidiaphragm noted.  No focal abnormalities seen in the liver or spleen on this study performed without intravenous contrast material.  The stomach, the duodenum, pancreas, gallbladder, and adrenal glands are unremarkable.  No stones are seen in either kidney.  There is no hydronephrosis. No secondary  changes in either kidney.  No abdominal aortic aneurysm.  No free fluid or lymphadenopathy in the abdomen.  The abdominal bowel loops are unremarkable.  Imaging through the pelvis shows a prominent stool volume in the rectal vault.  No intraperitoneal free fluid.  Multiple stones are seen in the bladder lumen, measuring up to 1.5 cm in diameter.  No pelvic sidewall lymphadenopathy.  There is some abnormal edema in the presacral soft tissues of indeterminate etiology.  The terminal ileum and the appendix are normal in appearance.  Bone windows reveal no worrisome lytic or sclerotic osseous lesions.  IMPRESSION: No renal or ureteral calculi.  No secondary changes in either kidney.   Multiple bladder stones are evident.  Original Report Authenticated By: ERIC A. MANSELL, M.D.   Ct Head Wo Contrast  03/12/2011  *RADIOLOGY REPORT*  Clinical Data: Mental status changes.  Not eating.  Speech disturbance.  History multiple sclerosis.  CT HEAD WITHOUT CONTRAST  Technique:  Contiguous axial images were obtained from the base of the skull through the vertex without contrast.  Comparison: MRI 10/20/2010  Findings: the brain shows generalized atrophy.  There is abnormal low density throughout the hemispheric white matter consistent with the patient's known chronic demyelinating disease.  This does not appear to have grossly changed.  No sign of acute infarction, mass lesion, hemorrhage, hydrocephalus or extra-axial collection. Sinuses, middle ears and mastoids are clear.  No skull or skull base lesion.  IMPRESSION: Chronic white matter lesions in brain atrophy consistent with chronic multiple sclerosis.  No sign of any identifiable acute process.  Original Report Authenticated By: Thomasenia Sales, M.D.   Dg Chest Port 1 View  03/13/2011  *RADIOLOGY REPORT*  Clinical Data: Pneumonia.  PORTABLE CHEST - 1 VIEW  Comparison: 03/12/2011.  Findings: The cardiac silhouette, mediastinal and hilar contours are within normal limits and stable.  Stable mild eventration of the left hemidiaphragm with mild overlying atelectasis.  Low lung volumes with mild vascular crowding but no infiltrates, edema or effusions.  IMPRESSION: No significant pulmonary findings.  Original Report Authenticated By: P. Loralie Champagne, M.D.   Dg Chest Portable 1 View  03/12/2011  *RADIOLOGY REPORT*  Clinical Data: Multiple sclerosis.  Failure to thrive.  PORTABLE CHEST - 1 VIEW  Comparison: 12/27/2010  Findings: 1715 hours.  Lung volumes are low. The lungs are clear without focal infiltrate, edema, pneumothorax or pleural effusion. The cardiopericardial silhouette is within normal limits for size. Imaged bony structures of the thorax  are intact. Telemetry leads overlie the chest.  IMPRESSION: Low volume film without acute findings.  Original Report Authenticated By: ERIC A. MANSELL, M.D.    Medications: Scheduled Meds:   . sodium chloride   Intravenous STAT  . baclofen  20 mg Oral TID  . buPROPion  150 mg Oral BID  . docusate sodium  100 mg Oral BID  . interferon beta-1b  0.3 mg Subcutaneous QODAY  . methylPREDNISolone (SOLU-MEDROL) injection  60 mg Intravenous Once  . omega-3 acid ethyl esters  1 g Oral Daily  . pantoprazole (PROTONIX) IV  40 mg Intravenous Q24H  . predniSONE  15 mg Oral Q breakfast  . senna  1 tablet Oral BID  . vitamin B-12  1,000 mcg Oral Daily  . DISCONTD: fish oil-omega-3 fatty acids  1,000 mg Oral Daily  . DISCONTD: Garlic  1,000 mg Oral Daily  . DISCONTD: pantoprazole  40 mg Oral Q0600  . DISCONTD: predniSONE  30 mg Oral Once  . DISCONTD: predniSONE  15 mg Oral Daily   Continuous Infusions:   . sodium chloride 100 mL/hr at 03/13/11 1312   PRN Meds:.acetaminophen, acetaminophen, alum & mag hydroxide-simeth, diphenhydrAMINE, food thickener, ibuprofen, ondansetron (ZOFRAN) IV, ondansetron, oxyCODONE  Assessment/Plan: 1 encephalopathy  Resolved, now back to baseline. Urine culture is pending.  #2 leukocytosis  CXR done today is negative. Urine culture is negative.  #3 hematuria  CT stone protocol is negative Follow urine culture Urology consulted,  #4 multiple sclerosis/quadriparesis  Stable continue home regimen of baclofen prednisone.   #5 prophylaxis  SCDs for DVT prophylaxis.     LOS: 1 day   [facility] Pager: [phone] 03/13/2011, 3:02 PM

## 2011-03-14 ENCOUNTER — Inpatient Hospital Stay (HOSPITAL_COMMUNITY): Payer: Medicare HMO

## 2011-03-14 MED ORDER — LORAZEPAM 2 MG/ML IJ SOLN
1.0000 mg | Freq: Once | INTRAMUSCULAR | Status: AC
  Administered 2011-03-14: 1 mg via INTRAVENOUS
  Filled 2011-03-14: qty 1

## 2011-03-14 NOTE — Progress Notes (Signed)
Spoke with Corrie Dandy at [facility], informed her patient will need hospital bed and air mattress , will fax order to her at [phone].

## 2011-03-14 NOTE — Progress Notes (Signed)
Subjective: Still very weak and poorly functional  Unable to verbalize enough or move   Objective: Vital signs in last 24 hours: Temp:  [97.1 F (36.2 C)-98.5 F (36.9 C)] 98.3 F (36.8 C) (03/06 0521) Pulse Rate:  [82-91] 82  (03/06 0521) Resp:  [18] 18  (03/06 0521) BP: (109-123)/(71-78) 123/78 mmHg (03/06 0521) SpO2:  [99 %] 99 % (03/06 0521) Weight:  [72.6 kg (160 lb 0.9 oz)] 72.6 kg (160 lb 0.9 oz) (03/06 0521) Weight change: 5 kg (11 lb 0.4 oz) Last BM Date: 03/12/11  Intake/Output from previous day: 03/05 0701 - 03/06 0700 In: 2620 [P.O.:320; I.V.:2300] Out: 610 [Urine:610]     Physical Exam: HEENT: Atraumatic, normocephalic, oculogyric movements,   Chest : Clear to auscultation bilaterally, no wheezing, no crackles Heart : S1 S2 Regular, no murmurs Abdomen: Soft, Nontender, no organomegaly Ext : No cyanosis, clubbing, edema Extremities: contractures,  Lab Results: Basic Metabolic Panel:  Basename 03/13/11 0500 03/12/11 1850 03/12/11 1251  NA 143 -- 142  K 3.7 -- 4.2  CL 110 -- 106  CO2 21 -- 26  GLUCOSE 114* -- 98  BUN 13 -- 13  CREATININE 0.73 -- 0.98  CALCIUM 9.1 -- 9.7  MG -- 2.6* --  PHOS -- -- --   Liver Function Tests:  [facility] 03/12/11 2054  AST 23  ALT 18  ALKPHOS 98  BILITOT 0.4  PROT 6.5  ALBUMIN 3.1*   No results found for this basename: LIPASE:2,AMYLASE:2 in the last 72 hours No results found for this basename: AMMONIA:2 in the last 72 hours CBC:  Basename 03/13/11 0500 03/12/11 1251  WBC 11.3* 12.0*  NEUTROABS 9.9* 9.1*  HGB 11.7* 13.2  HCT 34.4* 39.0  MCV 86.9 87.2  PLT 296 314   Coagulation:  Basename 03/12/11 1850  LABPROT 14.3  INR 1.09   Urine Drug Screen: Drugs of Abuse  No results found for this basename: labopia,  cocainscrnur,  labbenz,  amphetmu,  thcu,  labbarb    Alcohol Level: No results found for this basename: ETH:2 in the last 72 hours Urinalysis:  Basename 03/12/11 1423  COLORURINE YELLOW    LABSPEC 1.029  PHURINE 6.0  GLUCOSEU NEGATIVE  HGBUR MODERATE*  BILIRUBINUR SMALL*  KETONESUR 15*  PROTEINUR NEGATIVE  UROBILINOGEN 0.2  NITRITE NEGATIVE  LEUKOCYTESUR SMALL*   Misc. Labs:  Recent Results (from the past 240 hour(s))  URINE CULTURE     Status: Normal   Collection Time   03/12/11  2:23 PM      Component Value Range Status Comment   Specimen Description URINE, RANDOM   Final    Special Requests ADDED [id number]   Final    Culture  Setup Time [id number]   Final    Colony Count NO GROWTH   Final    Culture NO GROWTH   Final    Report Status 03/13/2011 FINAL   Final     Studies/Results: Ct Abdomen Pelvis Wo Contrast  03/12/2011  *RADIOLOGY REPORT*  Clinical Data: Microscopic hematuria.  CT ABDOMEN AND PELVIS WITHOUT CONTRAST  Technique:  Multidetector CT imaging of the abdomen and pelvis was performed following the standard protocol without intravenous contrast.  Comparison: None.  Findings: Asymmetric elevation of the left hemidiaphragm noted.  No focal abnormalities seen in the liver or spleen on this study performed without intravenous contrast material.  The stomach, the duodenum, pancreas, gallbladder, and adrenal glands are unremarkable.  No stones are seen in either kidney.  There is  no hydronephrosis. No secondary changes in either kidney.  No abdominal aortic aneurysm.  No free fluid or lymphadenopathy in the abdomen.  The abdominal bowel loops are unremarkable.  Imaging through the pelvis shows a prominent stool volume in the rectal vault.  No intraperitoneal free fluid.  Multiple stones are seen in the bladder lumen, measuring up to 1.5 cm in diameter.  No pelvic sidewall lymphadenopathy.  There is some abnormal edema in the presacral soft tissues of indeterminate etiology.  The terminal ileum and the appendix are normal in appearance.  Bone windows reveal no worrisome lytic or sclerotic osseous lesions.  IMPRESSION: No renal or ureteral calculi.  No secondary  changes in either kidney.  Multiple bladder stones are evident.  Original Report Authenticated By: ERIC A. MANSELL, M.D.   Ct Head Wo Contrast  03/12/2011  *RADIOLOGY REPORT*  Clinical Data: Mental status changes.  Not eating.  Speech disturbance.  History multiple sclerosis.  CT HEAD WITHOUT CONTRAST  Technique:  Contiguous axial images were obtained from the base of the skull through the vertex without contrast.  Comparison: MRI 10/20/2010  Findings: the brain shows generalized atrophy.  There is abnormal low density throughout the hemispheric white matter consistent with the patient's known chronic demyelinating disease.  This does not appear to have grossly changed.  No sign of acute infarction, mass lesion, hemorrhage, hydrocephalus or extra-axial collection. Sinuses, middle ears and mastoids are clear.  No skull or skull base lesion.  IMPRESSION: Chronic white matter lesions in brain atrophy consistent with chronic multiple sclerosis.  No sign of any identifiable acute process.  Original Report Authenticated By: Thomasenia Sales, M.D.   Dg Chest Port 1 View  03/13/2011  *RADIOLOGY REPORT*  Clinical Data: Pneumonia.  PORTABLE CHEST - 1 VIEW  Comparison: 03/12/2011.  Findings: The cardiac silhouette, mediastinal and hilar contours are within normal limits and stable.  Stable mild eventration of the left hemidiaphragm with mild overlying atelectasis.  Low lung volumes with mild vascular crowding but no infiltrates, edema or effusions.  IMPRESSION: No significant pulmonary findings.  Original Report Authenticated By: P. Loralie Champagne, M.D.   Dg Chest Portable 1 View  03/12/2011  *RADIOLOGY REPORT*  Clinical Data: Multiple sclerosis.  Failure to thrive.  PORTABLE CHEST - 1 VIEW  Comparison: 12/27/2010  Findings: 1715 hours.  Lung volumes are low. The lungs are clear without focal infiltrate, edema, pneumothorax or pleural effusion. The cardiopericardial silhouette is within normal limits for size. Imaged bony  structures of the thorax are intact. Telemetry leads overlie the chest.  IMPRESSION: Low volume film without acute findings.  Original Report Authenticated By: ERIC A. MANSELL, M.D.    Medications: Scheduled Meds:    . baclofen  20 mg Oral TID  . buPROPion  150 mg Oral BID  . docusate sodium  100 mg Oral BID  . feeding supplement  237 mL Oral TID WC  . interferon beta-1b  0.3 mg Subcutaneous QODAY  . mineral oil  1 enema Rectal Once  . mulitivitamin with minerals  1 tablet Oral Daily  . omega-3 acid ethyl esters  1 g Oral Daily  . predniSONE  15 mg Oral Q breakfast  . senna  1 tablet Oral BID  . vitamin B-12  1,000 mcg Oral Daily  . DISCONTD: pantoprazole (PROTONIX) IV  40 mg Intravenous Q24H   Continuous Infusions:    . DISCONTD: sodium chloride 100 mL/hr at 03/13/11 1312   PRN Meds:.acetaminophen, acetaminophen, alum & mag hydroxide-simeth, diphenhydrAMINE,  food thickener, ibuprofen, ondansetron (ZOFRAN) IV, ondansetron, oxyCODONE  Assessment/Plan:  1 encephalopathy  Resolved, now back to baseline. Urine culture is pending.  #2 leukocytosis  CXR negative  Urine culture is negative.  #3 hematuria  CT showing bladder stones  Urology consulted, recommended I and O cath   #4 multiple sclerosis/quadriparesis  Discussed with Dr. Anne Hahn - plan is to repeat MRI of the brain and cervical  spine,  He was on Tysabri and there is concern that he may have PML - will await MRI results.  Stable continue home regimen of baclofen prednisone.   Dysphagia on nectar thick liquids  #5 prophylaxis  SCDs for DVT prophylaxis.     LOS: 2 days   Mikaele Stecher [facility] Pager [phone] 03/14/2011, 8:10 AM

## 2011-03-14 NOTE — Progress Notes (Signed)
Speech Pathology: Dysphagia Treatment Note  Subjective: Awake, alert  Objective: Limited PO trials as patient had just completed eating the majority of his lunch tray and he declined further bites/sips after 3 of each, nodding "yes" when asked if he was full. SLP facilitated head positioning without any signs of aspiration and a timely oral transit. Patient's mother present and positional strategies reviewed for safest/most effective feeding.  Wife not present in this session.    Assessment: Demonstrates safe toleration and good intake with today's lunch.  Aware of physician's concern for overall and long term nutrition (Dr. Lavera Guise).  Recommendations:  1. Continue Dys.1 & Nectar-thick liquids 2. Crush meds 3. Full assist at meals with proper head support/positioning  Pain:   none Intervention Required:   No  Goals: Goals Partially Met  Myra Rude, M.S.,CCC-SLP Pager [phone]

## 2011-03-14 NOTE — Progress Notes (Signed)
Performed intermittent I&O cath- no urine output.

## 2011-03-14 NOTE — Progress Notes (Signed)
   CARE MANAGEMENT NOTE 03/14/2011  Patient:  Terry Palmer,Terry Palmer   Account Number:  [id number]  Date Initiated:  03/13/2011  Documentation initiated by:  DOWELL,DEBBIE  Subjective/Objective Assessment:   adm w uti, hx mult sclerosis     Action/Plan:   lives w wife, pcp dr edwin avbuere, [facility] w [facility]   Anticipated DC Date:  03/15/2011   Anticipated DC Plan:  HOME W HOME HEALTH SERVICES      DC Planning Services  CM consult      PAC Choice  Resumption Of Svcs/PTA Provider   Choice offered to / List presented to:          HH arranged  HH-1 RN      HH agency  [facility]   Status of service:   Medicare Important Message given?   (If response is "NO", the following Medicare IM given date fields will be blank) Date Medicare IM given:   Date Additional Medicare IM given:    Discharge Disposition:  HOME W HOME HEALTH SERVICES  Per UR Regulation:    Comments:  03/14/11 12:17 Genevieve Arbaugh RN, BSN [phone] Patient is active with [facility] per NCM note, I called Mary with [facility] to see what services patient had previously.  I tried to talke to patient , but patient was not awake yet.  3-5 act w hhc. alerted mary w [facility] who will follow along until disch. debbie dowell rn,bsn [phone]    

## 2011-03-14 NOTE — Progress Notes (Signed)
MRI was unable to get a clear picture. Per Dr. Lavera Guise, we will reschedule MRI Brain with contrast for tomorrow 2/7

## 2011-03-14 NOTE — Progress Notes (Signed)
HH services on hold for now, patient is not eating etc.  Will await plan for disposition.

## 2011-03-15 ENCOUNTER — Inpatient Hospital Stay (HOSPITAL_COMMUNITY): Payer: Medicare HMO

## 2011-03-15 MED ORDER — SODIUM CHLORIDE 0.9 % IV SOLN
INTRAVENOUS | Status: DC
  Administered 2011-03-15: 200 mL via INTRAVENOUS
  Administered 2011-03-15: 1000 mL via INTRAVENOUS

## 2011-03-15 MED ORDER — GADOBENATE DIMEGLUMINE 529 MG/ML IV SOLN
10.0000 mL | Freq: Once | INTRAVENOUS | Status: AC
  Administered 2011-03-15: 10 mL via INTRAVENOUS

## 2011-03-15 MED ORDER — LORAZEPAM 2 MG/ML IJ SOLN
2.0000 mg | Freq: Once | INTRAMUSCULAR | Status: AC
  Administered 2011-03-15: 2 mg via INTRAVENOUS
  Filled 2011-03-15: qty 1

## 2011-03-15 NOTE — Progress Notes (Signed)
   CARE MANAGEMENT NOTE 03/15/2011  Patient:  Terry Palmer, Terry Palmer   Account Number:  [id number]  Date Initiated:  03/13/2011  Documentation initiated by:  Junius Creamer  Subjective/Objective Assessment:   adm w uti, hx mult sclerosis     Action/Plan:   lives w wife, pcp dr Fleet Contras, [facility]   Anticipated DC Date:  03/15/2011   Anticipated DC Plan:  HOME W HOME HEALTH SERVICES      DC Planning Services  CM consult      [facility] Choice  Resumption Of Svcs/PTA Provider   Choice offered to / List presented to:     DME arranged  AIR OVERLAY MATTRESS  HOSPITAL BED        HH arranged  HH-1 RN      [facility] agency  [facility]   Status of service:   Medicare Important Message given?   (If response is "NO", the following Medicare IM given date fields will be blank) Date Medicare IM given:   Date Additional Medicare IM given:    Discharge Disposition:  HOME W HOME HEALTH SERVICES  Per UR Regulation:    Comments:  03/15/11- 1245- Donn Pierini RN, BSN [phone] Received notice from [facility] that pt already has hospital bed, order for gel overlay given to [facility] with [facility]. Per pt's wife they are working with someone to get pt a newer bed through an assistance program. CM to continue to follow for d/c needs. Will need HH-RN orders prior to discharge for I&O cath at home.  03/14/11 12:17 Letha Cape RN, BSN [phone] Patient is active with [facility] per NCM note, I called Corrie Dandy with [facility] to see what services patient had previously.  I tried to talke to patient , but patient was not awake yet.  Spoke with mother [name] she states they will need a hospital bed and air mattress, her phone number is  [phone].  Mother states wife name is [name] , her home phone is [phone] and cell is [phone].  I called [facility] with [facility] and faxed orders for hospital bed and air mattress, she states they will coordinate with wife for delivery tomorrow.  3-5 act w hhc. alerted mary w  [facility] who will follow along until disch. debbie dowell rn,bsn [phone]

## 2011-03-15 NOTE — Progress Notes (Signed)
Subjective: Sedated post mri  Objective: Vital signs in last 24 hours: Temp:  [98 F (36.7 C)-98.2 F (36.8 C)] 98 F (36.7 C) (03/07 1442) Pulse Rate:  [85-107] 107  (03/07 1442) Resp:  [18-20] 19  (03/07 1442) BP: (104-112)/(68-72) 104/72 mmHg (03/07 1442) SpO2:  [98 %-100 %] 98 % (03/07 1442) Weight:  [70 kg (154 lb 5.2 oz)] 70 kg (154 lb 5.2 oz) (03/07 0558) Weight change: -2.6 kg (-5 lb 11.7 oz) Last BM Date: 03/14/11  Intake/Output from previous day: 03/06 0701 - 03/07 0700 In: 365 [P.O.:365] Out: 951 [Urine:950; Stool:1] Total I/O In: 240 [P.O.:240] Out: 200 [Urine:200]   Physical Exam: HEENT: Atraumatic, normocephalic, oculogyric movements,   Chest : Clear to auscultation bilaterally, no wheezing, no crackles Heart : S1 S2 Regular, no murmurs Abdomen: Soft, Nontender, no organomegaly Ext : No cyanosis, clubbing, edema Extremities: contractures,  Lab Results: Basic Metabolic Panel:  Basename 03/13/11 0500 03/12/11 1850  NA 143 --  K 3.7 --  CL 110 --  CO2 21 --  GLUCOSE 114* --  BUN 13 --  CREATININE 0.73 --  CALCIUM 9.1 --  MG -- 2.6*  PHOS -- --   Liver Function Tests:  [facility] 03/12/11 2054  AST 23  ALT 18  ALKPHOS 98  BILITOT 0.4  PROT 6.5  ALBUMIN 3.1*   No results found for this basename: LIPASE:2,AMYLASE:2 in the last 72 hours No results found for this basename: AMMONIA:2 in the last 72 hours CBC:  Basename 03/13/11 0500  WBC 11.3*  NEUTROABS 9.9*  HGB 11.7*  HCT 34.4*  MCV 86.9  PLT 296   Coagulation:  Basename 03/12/11 1850  LABPROT 14.3  INR 1.09   Urine Drug Screen: Drugs of Abuse  No results found for this basename: labopia,  cocainscrnur,  labbenz,  amphetmu,  thcu,  labbarb    Alcohol Level: No results found for this basename: ETH:2 in the last 72 hours Urinalysis: No results found for this basename:  COLORURINE:2,APPERANCEUR:2,LABSPEC:2,PHURINE:2,GLUCOSEU:2,HGBUR:2,BILIRUBINUR:2,KETONESUR:2,PROTEINUR:2,UROBILINOGEN:2,NITRITE:2,LEUKOCYTESUR:2 in the last 72 hours Misc. Labs:  Recent Results (from the past 240 hour(s))  URINE CULTURE     Status: Normal   Collection Time   03/12/11  2:23 PM      Component Value Range Status Comment   Specimen Description URINE, RANDOM   Final    Special Requests ADDED [id number]   Final    Culture  Setup Time [id number]   Final    Colony Count NO GROWTH   Final    Culture NO GROWTH   Final    Report Status 03/13/2011 FINAL   Final     Studies/Results: Mr Laqueta Jean MW Contrast  03/15/2011  *RADIOLOGY REPORT*  Clinical Data:  History of multiple sclerosis.  Decrease in mental status.  Please note present examination was performed over 2 days as the patient could not tolerate imaging one time.  MRI HEAD WITHOUT AND WITH CONTRAST MRI CERVICAL SPINE WITHOUT AND WITH CONTRAST  Technique:  Multiplanar, multiecho pulse sequences of the brain and surrounding structures, and cervical spine, to include the craniocervical junction and cervicothoracic junction, were obtained without and with intravenous contrast.  Contrast: ,10mL MULTIHANCE GADOBENATE DIMEGLUMINE 529 MG/ML IV SOLN  Comparison:  10/20/2010.  MRI HEAD  Findings:  No acute infarct.  Mild progression of the severe demyelinating disease with change most apparent in the posterior-superior periventricular region and periatrial region.  None of these areas demonstrates enhancement or restricted motion as can be seen with active areas  of demyelination.  No intracranial mass.  No intracranial hemorrhage.  Global atrophy without hydrocephalus.  Major intracranial vascular structures are patent.  Minimal paranasal sinus mucosal thickening.  Cervical spondylotic changes as discussed below.  IMPRESSION: Mild progression of the severe demyelinating disease with change most apparent in the posterior-superior periventricular  region and periatrial region.  None of these areas demonstrates enhancement or restricted motion as can be seen with active areas of demyelination.  MRI CERVICAL SPINE  Findings: Diffuse altered signal intensity throughout the cervical and thoracic spine consistent with demyelinating process.  It is difficult to make direct comparison to the most recent prior exam as the prior exam is motion degraded.  Grossly there appears been no significant change.  None of these areas demonstrates enhancement to suggest active demyelination.  C2-3:  Bulge/shallow protrusion.  C3-4:  Shallow broad-based protrusion greater to the right with minimal cord flattening.  C4-5:  Minimal bulge.  Minimal Schmorl's node deformity.  C5-6:  Minimal to mild bulge.  C6-7:  Minimal Schmorl's node deformity.  Minimal bulge.  C7-T1:  Negative.  2 HU hemangioma unchanged.  IMPRESSION: Relatively similar appearance of demyelination involving the cervical and thoracic cord taking into account that the prior examination was motion degraded.  Similar appearance of disc disease most notable C3-4 level.  Please see above.  Original Report Authenticated By: Fuller Canada, M.D.   Mr Cervical Spine W Wo Contrast  03/15/2011  *RADIOLOGY REPORT*  Clinical Data:  History of multiple sclerosis.  Decrease in mental status.  Please note present examination was performed over 2 days as the patient could not tolerate imaging one time.  MRI HEAD WITHOUT AND WITH CONTRAST MRI CERVICAL SPINE WITHOUT AND WITH CONTRAST  Technique:  Multiplanar, multiecho pulse sequences of the brain and surrounding structures, and cervical spine, to include the craniocervical junction and cervicothoracic junction, were obtained without and with intravenous contrast.  Contrast: ,10mL MULTIHANCE GADOBENATE DIMEGLUMINE 529 MG/ML IV SOLN  Comparison:  10/20/2010.  MRI HEAD  Findings:  No acute infarct.  Mild progression of the severe demyelinating disease with change most apparent in  the posterior-superior periventricular region and periatrial region.  None of these areas demonstrates enhancement or restricted motion as can be seen with active areas of demyelination.  No intracranial mass.  No intracranial hemorrhage.  Global atrophy without hydrocephalus.  Major intracranial vascular structures are patent.  Minimal paranasal sinus mucosal thickening.  Cervical spondylotic changes as discussed below.  IMPRESSION: Mild progression of the severe demyelinating disease with change most apparent in the posterior-superior periventricular region and periatrial region.  None of these areas demonstrates enhancement or restricted motion as can be seen with active areas of demyelination.  MRI CERVICAL SPINE  Findings: Diffuse altered signal intensity throughout the cervical and thoracic spine consistent with demyelinating process.  It is difficult to make direct comparison to the most recent prior exam as the prior exam is motion degraded.  Grossly there appears been no significant change.  None of these areas demonstrates enhancement to suggest active demyelination.  C2-3:  Bulge/shallow protrusion.  C3-4:  Shallow broad-based protrusion greater to the right with minimal cord flattening.  C4-5:  Minimal bulge.  Minimal Schmorl's node deformity.  C5-6:  Minimal to mild bulge.  C6-7:  Minimal Schmorl's node deformity.  Minimal bulge.  C7-T1:  Negative.  2 HU hemangioma unchanged.  IMPRESSION: Relatively similar appearance of demyelination involving the cervical and thoracic cord taking into account that the prior examination was motion  degraded.  Similar appearance of disc disease most notable C3-4 level.  Please see above.  Original Report Authenticated By: Fuller Canada, M.D.   MRI brain with multiple white matter lesions - none of them enhancing Medications: Scheduled Meds:    . baclofen  20 mg Oral TID  . buPROPion  150 mg Oral BID  . docusate sodium  100 mg Oral BID  . feeding supplement   237 mL Oral TID WC  . gadobenate dimeglumine  10 mL Intravenous Once  . interferon beta-1b  0.3 mg Subcutaneous QODAY  . LORazepam  2 mg Intravenous Once  . mulitivitamin with minerals  1 tablet Oral Daily  . omega-3 acid ethyl esters  1 g Oral Daily  . predniSONE  15 mg Oral Q breakfast  . senna  1 tablet Oral BID  . vitamin B-12  1,000 mcg Oral Daily   Continuous Infusions:    . sodium chloride 200 mL (03/15/11 1143)   PRN Meds:.acetaminophen, acetaminophen, alum & mag hydroxide-simeth, diphenhydrAMINE, food thickener, ibuprofen, ondansetron (ZOFRAN) IV, ondansetron, oxyCODONE  Assessment/Plan:  1 encephalopathy  Resolved, now back to baseline. Urine culture no growth   #2 leukocytosis  CXR negative  Urine culture is negative. Resolved   #3 hematuria  CT showing bladder stones  Urology consulted, recommended I and O cath   #4 multiple sclerosis/quadriparesis  Discussed with Dr. Anne Hahn - plan is to repeat MRI of the brain and cervical  spine,  He was on Tysabri and there is concern that he may have PML - will await MRI results.  Stable continue home regimen of baclofen prednisone.  MRI rsulted on 03/15/11 no active lesions - will continue betaseron and get palliative care for goals of care meeting  Dysphagia on nectar thick liquids  #5 prophylaxis  SCDs for DVT prophylaxis.     LOS: 3 days   Ruel Dimmick [facility] Pager [phone] 03/15/2011, 6:19 PM

## 2011-03-15 NOTE — Progress Notes (Signed)
   CARE MANAGEMENT NOTE 03/15/2011  Patient:  Terry Palmer, Terry Palmer   Account Number:  [id number]  Date Initiated:  03/13/2011  Documentation initiated by:  Junius Creamer  Subjective/Objective Assessment:   adm w uti, hx mult sclerosis     Action/Plan:   lives w wife, pcp dr Fleet Contras, [facility]   Anticipated DC Date:  03/15/2011   Anticipated DC Plan:  HOME W HOME HEALTH SERVICES      DC Planning Services  CM consult      [facility] Choice  Resumption Of Svcs/PTA Provider   Choice offered to / List presented to:     DME arranged  AIR OVERLAY MATTRESS  HOSPITAL BED        HH arranged  HH-1 RN      [facility] agency  [facility]   Status of service:   Medicare Important Message given?   (If response is "NO", the following Medicare IM given date fields will be blank) Date Medicare IM given:   Date Additional Medicare IM given:    Discharge Disposition:  HOME W HOME HEALTH SERVICES  Per UR Regulation:    Comments:  03/14/11 12:17 Letha Cape RN, BSN [phone] Patient is active with [facility] per NCM note, I called Corrie Dandy with [facility] to see what services patient had previously.  I tried to talke to patient , but patient was not awake yet.  Spoke with mother [name] she states they will need a hospital bed and air mattress, her phone number is  [phone].  Mother states wife name is [name] , her home phone is [phone] and cell is [phone].  I called [facility] with [facility] and faxed orders for hospital bed and air mattress, she states they will coordinate with wife for delivery tomorrow.  3-5 act w hhc. alerted mary w [facility] who will follow along until disch. debbie dowell rn,bsn [phone]

## 2011-03-16 LAB — BASIC METABOLIC PANEL
Calcium: 8.8 mg/dL (ref 8.4–10.5)
Creatinine, Ser: 0.77 mg/dL (ref 0.50–1.35)
GFR calc Af Amer: >90 mL/min (ref >90)
GFR calc non Af Amer: >90 mL/min (ref >90)
Sodium: 136 mEq/L (ref 135–145)

## 2011-03-16 LAB — CBC
MCH: 29.1 pg (ref 26.0–34.0)
MCHC: 34 g/dL (ref 30.0–36.0)
MCV: 85.5 fL (ref 78.0–100.0)
Platelets: 284 10*3/uL (ref 150–400)
RBC: 3.92 MIL/uL — ABNORMAL LOW (ref 4.22–5.81)
RDW: 13.5 % (ref 11.5–15.5)

## 2011-03-16 MED ORDER — KCL IN DEXTROSE-NACL 40-5-0.45 MEQ/L-%-% IV SOLN
INTRAVENOUS | Status: DC
  Administered 2011-03-16 – 2011-03-19 (×3): via INTRAVENOUS
  Filled 2011-03-16 (×7): qty 1000

## 2011-03-16 NOTE — Progress Notes (Signed)
Speech Language/Pathology Pt now NPO due to choking episode during breakfast. RN reports pt is often pocketing food with frequent coughing with nectar thick liquids. Spoke with Dr. Lavera Guise who reports that due to the pts fluctuating alertness level safety with PO intake likely to be inconsistent. Dr. Lavera Guise plans to discuss plan of care with family to decide on continuing PO diet with known risk vs alternative nutrition.   Recent objective testing demonstrates that pt does have ability to consume more restrictive diet though POs should be presented when pt is most alert. If POs are to be resumed with an attempt to reduce aspiration risk consider initiating (dys 1) Puree diet with honey thick liquids. SLP would be glad to provide education to family as needed and will continue to follow pt as plan of care decisions are made.  Harlon Ditty, MA CCC-SLP [phone]

## 2011-03-16 NOTE — Progress Notes (Signed)
Palliative Medicine Team consult for goals of care requested by Dr Lavera Guise; spoke with patient's mother Elenka Ramsay at bedside she stated she can confirm appointment time for patient's wife [name] and her ex-husband patient's father who plan to participate-  meeting scheduled for tomorrow, Saturday 03/17/11 @ 11:30 am.   Valente David, RN 03/16/2011, 1:40 PM Palliative Medicine Team RN Liaison [phone]

## 2011-03-16 NOTE — Progress Notes (Signed)
Utilization review complete 

## 2011-03-16 NOTE — Progress Notes (Signed)
Pt choked earlier today while he was being fed . His skin color dusky and no air movement  Heimlich maneuver and turning to side and suction was done. Patient vomited a moderate amt of emesis. Dr. Lavera Guise and  Rapid Response at bedside. Pt  Became more awake , coughing some and his sat was in the 90's  Placed on monitor /Dr. Lavera Guise and 02 at 2l  , HOB up NPO for now with meds only.O2 sat on finger for continuous monitoring

## 2011-03-16 NOTE — Progress Notes (Signed)
Subjective: Choked with eggs today   Objective: Vital signs in last 24 hours: Temp:  [98 F (36.7 C)-99.3 F (37.4 C)] 98.5 F (36.9 C) (03/08 0545) Pulse Rate:  [96-107] 96  (03/08 0545) Resp:  [18-20] 18  (03/08 0545) BP: (104-116)/(66-80) 116/80 mmHg (03/08 0545) SpO2:  [98 %] 98 % (03/08 0545) Weight:  [69.7 kg (153 lb 10.6 oz)] 69.7 kg (153 lb 10.6 oz) (03/08 0545) Weight change: -0.3 kg (-10.6 oz) Last BM Date: 03/14/11  Intake/Output from previous day: 03/07 0701 - 03/08 0700 In: 1029.2 [P.O.:665; I.V.:364.2] Out: 1585 [Urine:1585]     Physical Exam: Now he is in no distress  Awake Minimally responsive Cvs: rrr Rs: ctab  Abdomen : soft, nt Contractures Non verbal    Lab Results: Basic Metabolic Panel:  Basename 03/16/11 0618  NA 136  K 3.2*  CL 102  CO2 23  GLUCOSE 77  BUN 4*  CREATININE 0.77  CALCIUM 8.8  MG --  PHOS --   Liver Function Tests: No results found for this basename: AST:2,ALT:2,ALKPHOS:2,BILITOT:2,PROT:2,ALBUMIN:2 in the last 72 hours No results found for this basename: LIPASE:2,AMYLASE:2 in the last 72 hours No results found for this basename: AMMONIA:2 in the last 72 hours CBC:  Basename 03/16/11 0618  WBC 10.7*  NEUTROABS --  HGB 11.4*  HCT 33.5*  MCV 85.5  PLT 284    Recent Results (from the past 240 hour(s))  URINE CULTURE     Status: Normal   Collection Time   03/12/11  2:23 PM      Component Value Range Status Comment   Specimen Description URINE, RANDOM   Final    Special Requests ADDED [id number]   Final    Culture  Setup Time [id number]   Final    Colony Count NO GROWTH   Final    Culture NO GROWTH   Final    Report Status 03/13/2011 FINAL   Final     Studies/Results: Mr Laqueta Jean NW Contrast  03/15/2011  *RADIOLOGY REPORT*  Clinical Data:  History of multiple sclerosis.  Decrease in mental status.  Please note present examination was performed over 2 days as the patient could not tolerate imaging one time.   MRI HEAD WITHOUT AND WITH CONTRAST MRI CERVICAL SPINE WITHOUT AND WITH CONTRAST  Technique:  Multiplanar, multiecho pulse sequences of the brain and surrounding structures, and cervical spine, to include the craniocervical junction and cervicothoracic junction, were obtained without and with intravenous contrast.  Contrast: ,10mL MULTIHANCE GADOBENATE DIMEGLUMINE 529 MG/ML IV SOLN  Comparison:  10/20/2010.  MRI HEAD  Findings:  No acute infarct.  Mild progression of the severe demyelinating disease with change most apparent in the posterior-superior periventricular region and periatrial region.  None of these areas demonstrates enhancement or restricted motion as can be seen with active areas of demyelination.  No intracranial mass.  No intracranial hemorrhage.  Global atrophy without hydrocephalus.  Major intracranial vascular structures are patent.  Minimal paranasal sinus mucosal thickening.  Cervical spondylotic changes as discussed below.  IMPRESSION: Mild progression of the severe demyelinating disease with change most apparent in the posterior-superior periventricular region and periatrial region.  None of these areas demonstrates enhancement or restricted motion as can be seen with active areas of demyelination.  MRI CERVICAL SPINE  Findings: Diffuse altered signal intensity throughout the cervical and thoracic spine consistent with demyelinating process.  It is difficult to make direct comparison to the most recent prior exam as the prior exam  is motion degraded.  Grossly there appears been no significant change.  None of these areas demonstrates enhancement to suggest active demyelination.  C2-3:  Bulge/shallow protrusion.  C3-4:  Shallow broad-based protrusion greater to the right with minimal cord flattening.  C4-5:  Minimal bulge.  Minimal Schmorl's node deformity.  C5-6:  Minimal to mild bulge.  C6-7:  Minimal Schmorl's node deformity.  Minimal bulge.  C7-T1:  Negative.  2 HU hemangioma unchanged.   IMPRESSION: Relatively similar appearance of demyelination involving the cervical and thoracic cord taking into account that the prior examination was motion degraded.  Similar appearance of disc disease most notable C3-4 level.  Please see above.  Original Report Authenticated By: Fuller Canada, M.D.   Mr Cervical Spine W Wo Contrast  03/15/2011  *RADIOLOGY REPORT*  Clinical Data:  History of multiple sclerosis.  Decrease in mental status.  Please note present examination was performed over 2 days as the patient could not tolerate imaging one time.  MRI HEAD WITHOUT AND WITH CONTRAST MRI CERVICAL SPINE WITHOUT AND WITH CONTRAST  Technique:  Multiplanar, multiecho pulse sequences of the brain and surrounding structures, and cervical spine, to include the craniocervical junction and cervicothoracic junction, were obtained without and with intravenous contrast.  Contrast: ,10mL MULTIHANCE GADOBENATE DIMEGLUMINE 529 MG/ML IV SOLN  Comparison:  10/20/2010.  MRI HEAD  Findings:  No acute infarct.  Mild progression of the severe demyelinating disease with change most apparent in the posterior-superior periventricular region and periatrial region.  None of these areas demonstrates enhancement or restricted motion as can be seen with active areas of demyelination.  No intracranial mass.  No intracranial hemorrhage.  Global atrophy without hydrocephalus.  Major intracranial vascular structures are patent.  Minimal paranasal sinus mucosal thickening.  Cervical spondylotic changes as discussed below.  IMPRESSION: Mild progression of the severe demyelinating disease with change most apparent in the posterior-superior periventricular region and periatrial region.  None of these areas demonstrates enhancement or restricted motion as can be seen with active areas of demyelination.  MRI CERVICAL SPINE  Findings: Diffuse altered signal intensity throughout the cervical and thoracic spine consistent with demyelinating process.   It is difficult to make direct comparison to the most recent prior exam as the prior exam is motion degraded.  Grossly there appears been no significant change.  None of these areas demonstrates enhancement to suggest active demyelination.  C2-3:  Bulge/shallow protrusion.  C3-4:  Shallow broad-based protrusion greater to the right with minimal cord flattening.  C4-5:  Minimal bulge.  Minimal Schmorl's node deformity.  C5-6:  Minimal to mild bulge.  C6-7:  Minimal Schmorl's node deformity.  Minimal bulge.  C7-T1:  Negative.  2 HU hemangioma unchanged.  IMPRESSION: Relatively similar appearance of demyelination involving the cervical and thoracic cord taking into account that the prior examination was motion degraded.  Similar appearance of disc disease most notable C3-4 level.  Please see above.  Original Report Authenticated By: Fuller Canada, M.D.   MRI brain with multiple white matter lesions - none of them enhancing Medications: Scheduled Meds:    . baclofen  20 mg Oral TID  . buPROPion  150 mg Oral BID  . docusate sodium  100 mg Oral BID  . feeding supplement  237 mL Oral TID WC  . gadobenate dimeglumine  10 mL Intravenous Once  . interferon beta-1b  0.3 mg Subcutaneous QODAY  . LORazepam  2 mg Intravenous Once  . mulitivitamin with minerals  1 tablet Oral  Daily  . omega-3 acid ethyl esters  1 g Oral Daily  . predniSONE  15 mg Oral Q breakfast  . senna  1 tablet Oral BID  . vitamin B-12  1,000 mcg Oral Daily   Continuous Infusions:    . dextrose 5 % and 0.45 % NaCl with KCl 40 mEq/L    . DISCONTD: sodium chloride 1,000 mL (03/15/11 1859)   PRN Meds:.acetaminophen, acetaminophen, alum & mag hydroxide-simeth, diphenhydrAMINE, food thickener, ibuprofen, ondansetron (ZOFRAN) IV, ondansetron, oxyCODONE  Assessment/Plan:  1 encephalopathy  Resolved, now back to baseline. Urine culture no growth   #2 leukocytosis  CXR negative  Urine culture is negative. 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 Anne Hahn - plan is to repeat MRI of the brain and cervical  spine,  He was on Tysabri and there is concern that he may have PML - but MRI does not indicate such  Stable continue home regimen of baclofen prednisone.  MRI resulted on 03/15/11 -  no active lesions - will continue betaseron and get palliative care for goals of care meeting   We need to address artificial feeding and code status   #5 prophylaxis  SCDs for DVT prophylaxis.     LOS: 4 days   Nateisha Moyd [facility] Pager [phone] 03/16/2011, 8:13 AM

## 2011-03-16 NOTE — Significant Event (Signed)
Rapid Response Event Note  Overview: Time Called: 0836 Arrival Time: 7829 Event Type: Other (Comment) (choking)  Initial Focused Assessment: Per Staff, patient choked on small spoonful of scrambled eggs while NT feeding patient.  RN performed Heimlich maneuver, pt vomited. Upon arrival patient breathing freely, good air movement bilat lung sounds.  No stridor noted.  Suction at bedside.  Patient mildly responsive. VSS  BP 117/83,  HR 108, RR 18, O2 sat 99%, Temp 98 oral Dr Lavera Guise at bedside, orders received. Patient repositioned and oral care done. Patient becoming more alert and responsive, back to baseline.  Interventions: Pt now NPO except meds O2 via [place] 500 cc NS bolus given RN to call if assistance needed.     Event Summary: Name of Physician Notified: Dr Lavera Guise at [phone]    at    Outcome: Stayed in room and stabalized  Event End Time: 0905  Marcellina Millin

## 2011-03-17 LAB — CBC
MCV: 84.6 fL (ref 78.0–100.0)
Platelets: 338 10*3/uL (ref 150–400)
RBC: 4.22 MIL/uL (ref 4.22–5.81)
RDW: 13.3 % (ref 11.5–15.5)
WBC: 12.5 10*3/uL — ABNORMAL HIGH (ref 4.0–10.5)

## 2011-03-17 LAB — BASIC METABOLIC PANEL
CO2: 23 mEq/L (ref 19–32)
Calcium: 9.1 mg/dL (ref 8.4–10.5)
Chloride: 98 mEq/L (ref 96–112)
Creatinine, Ser: 0.82 mg/dL (ref 0.50–1.35)
GFR calc Af Amer: >90 mL/min (ref >90)
Sodium: 133 mEq/L — ABNORMAL LOW (ref 135–145)

## 2011-03-17 MED ORDER — LEVOFLOXACIN IN D5W 500 MG/100ML IV SOLN
500.0000 mg | INTRAVENOUS | Status: DC
  Administered 2011-03-17 – 2011-03-18 (×2): 500 mg via INTRAVENOUS
  Filled 2011-03-17 (×3): qty 100

## 2011-03-17 MED ORDER — WHITE PETROLATUM GEL
Status: AC
  Administered 2011-03-17: 12:00:00
  Filled 2011-03-17: qty 5

## 2011-03-17 MED ORDER — METHOCARBAMOL 100 MG/ML IJ SOLN
500.0000 mg | Freq: Three times a day (TID) | INTRAVENOUS | Status: DC
  Administered 2011-03-17 – 2011-03-26 (×28): 500 mg via INTRAVENOUS
  Filled 2011-03-17 (×50): qty 5

## 2011-03-17 NOTE — Progress Notes (Signed)
Subjective: Choked with meds today  He is choking with his own saliva Objective: Vital signs in last 24 hours: Temp:  [98.4 F (36.9 C)-100.3 F (37.9 C)] 100.3 F (37.9 C) (03/09 1431) Pulse Rate:  [102-119] 119  (03/09 1431) Resp:  [20] 20  (03/09 1431) BP: (109-119)/(76-80) 119/80 mmHg (03/09 1431) SpO2:  [95 %-100 %] 95 % (03/09 1431) Weight:  [68.9 kg (151 lb 14.4 oz)] 68.9 kg (151 lb 14.4 oz) (03/09 0511) Weight change: -0.8 kg (-1 lb 12.2 oz) Last BM Date: 03/14/11  Intake/Output from previous day: 03/08 0701 - 03/09 0700 In: 1491.7 [I.V.:1491.7] Out: 1375 [Urine:1375] Total I/O In: -  Out: 200 [Urine:200]   Physical Exam:  Minimally responsive Cvs: rrr Rs: ctab  Abdomen : soft, nt Contractures Non verbal  Moaning at times  Lab Results: Basic Metabolic Panel:  Basename 03/17/11 0615 03/16/11 0618  NA 133* 136  K 3.6 3.2*  CL 98 102  CO2 23 23  GLUCOSE 86 77  BUN 5* 4*  CREATININE 0.82 0.77  CALCIUM 9.1 8.8  MG -- --  PHOS -- --   Liver Function Tests: No results found for this basename: AST:2,ALT:2,ALKPHOS:2,BILITOT:2,PROT:2,ALBUMIN:2 in the last 72 hours No results found for this basename: LIPASE:2,AMYLASE:2 in the last 72 hours No results found for this basename: AMMONIA:2 in the last 72 hours CBC:  Basename 03/17/11 0615 03/16/11 0618  WBC 12.5* 10.7*  NEUTROABS -- --  HGB 12.2* 11.4*  HCT 35.7* 33.5*  MCV 84.6 85.5  PLT 338 284    Recent Results (from the past 240 hour(s))  URINE CULTURE     Status: Normal   Collection Time   03/12/11  2:23 PM      Component Value Range Status Comment   Specimen Description URINE, RANDOM   Final    Special Requests ADDED [id number]   Final    Culture  Setup Time [id number]   Final    Colony Count NO GROWTH   Final    Culture NO GROWTH   Final    Report Status 03/13/2011 FINAL   Final     Studies/Results: No results found. MRI brain with multiple white matter lesions - none of them  enhancing Medications: Scheduled Meds:    . interferon beta-1b  0.3 mg Subcutaneous QODAY  . levofloxacin (LEVAQUIN) IV  500 mg Intravenous Q24H  . methocarbamol(ROBAXIN) IV  500 mg Intravenous Q8H  . white petrolatum      . DISCONTD: baclofen  20 mg Oral TID  . DISCONTD: docusate sodium  100 mg Oral BID  . DISCONTD: feeding supplement  237 mL Oral TID WC  . DISCONTD: predniSONE  15 mg Oral Q breakfast   Continuous Infusions:    . dextrose 5 % and 0.45 % NaCl with KCl 40 mEq/L 50 mL/hr at 03/16/11 0930   PRN Meds:.acetaminophen, acetaminophen, alum & mag hydroxide-simeth, diphenhydrAMINE, food thickener, ibuprofen, ondansetron (ZOFRAN) IV, ondansetron  Assessment/Plan:  1 encephalopathy - worse again today. Suspect probably due to aspiration event.  #2 leukocytosis  Probably aspiration pneumonia - we'll start Levaquin Urine culture is negative.   #3 hematuria  CT showing bladder stones  Urology consulted, recommended I and O cath . 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 Anne Hahn - plan is to repeat MRI of the brain and cervical  spine,  He was on Tysabri and there is concern that he may have PML - but MRI does not indicate such  MRI resulted on 03/15/11 -  no active lesions - will continue betaseron and get palliative care for goals of care meeting   We need to address artificial feeding and code status   #5 prophylaxis  SCDs for DVT prophylaxis.     LOS: 5 days   Chenoah Mcnally [facility] Pager [phone] 03/17/2011, 4:32 PM

## 2011-03-17 NOTE — Progress Notes (Signed)
Patient choked earlier while taking crushed meds in applesauce. Gasping for air and drooling. Suction him out and patient cleared with coughing. Dr. Lavera Guise aware. Will continue to monitor.

## 2011-03-17 NOTE — Consult Note (Signed)
Consult Note from the Palliative Medicine Team at [facility] Patient Terry Palmer      DOB: 02-11-1975      XBM:[id number]   Consult Requested by: Dr. Lavera Guise     PCP: Dorrene German, MD, MD Reason for Consultation:       Phone Number:[phone]  Assessment and Plan: 1. Code Status: Full code for now.  Patient's spouse is going to contact their Imam and see if he can meet and assist in the interpretation of end of life choices that would be compatible with their Muslim faith. 2. Symptom Control: 1. Dysphagia: Keep patient NPO while family decides on placement of a feeding tube.  Permission will be given to paint the tongue with flavors with explicit instructions to not soak the sponge.  Patient's spouse and I need to discuss option to not place feeding tube if Terry Palmer is truly at end of life. 2. Altered mental status secondary to progression of MS, less likely PML.  [name] Mother asks that we clarify how to check for PML 3. Psycho/Social: The patient is married with two children a boy and girl ages 32 and 41.  His mother and father are  Terry Palmer and Terry Palmer are active in his care and are very supportive of his wife.  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  He loves classical music, so I have left our CD player for his family to play for him. 4. Spiritual: Patient is Muslim and his wife asks that we respect his wishes around death rituals- turning his bed to face [place], having the Imam prepare his body after death.  I will get more information from the Imam when he comes. 5. Disposition: To be determined . I suspect that with his poor prognosis that he will need residential placement.  We will work through the current goals and re-assess.  Patient Documents Completed or Given: Document Given Completed  Advanced Directives Pkt    MOST    DNR    Gone from My Sight    Hard Choices      Brief HPI: Patient is a 36 year old male with a known history of multiple sclerosis he  presented to the hospital with worsening mental status in the context of having recent treatment with Tysabri. There was some concern that his alteration in mental status could be secondary to PML. 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 Patient was evaluated by MRI and found to have diffuse demyelinating disease apparent in the posterior superior periventricular region and peritrial region. I have been asked to work with this family on both of care  ROS: Unable secondary to altered mental status and poor verbal skills    PMH:  Past Medical History  Diagnosis Date  . MS (multiple sclerosis)   . UTI (urinary tract infection)   . Childhood asthma   . 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 Neuromuscular disorder     Quadraperesis  . Depression      PSH: Past Surgical History  Procedure Date  . Lumbar puncture 10/12/2002   I have reviewed the FH and SH and  If appropriate update it with new information. No Known Allergies Scheduled Meds:   . interferon beta-1b  0.3 mg Subcutaneous QODAY  . methocarbamol(ROBAXIN) IV  500 mg Intravenous Q8H  . white petrolatum      . DISCONTD: baclofen  20 mg Oral TID  . DISCONTD: docusate sodium  100 mg Oral BID  .  DISCONTD: feeding supplement  237 mL Oral TID WC  . DISCONTD: predniSONE  15 mg Oral Q breakfast   Continuous Infusions:   . dextrose 5 % and 0.45 % NaCl with KCl 40 mEq/L 50 mL/hr at 03/16/11 0930   PRN Meds:.acetaminophen, acetaminophen, alum & mag hydroxide-simeth, diphenhydrAMINE, food thickener, ibuprofen, ondansetron (ZOFRAN) IV, ondansetron    BP 119/80  Pulse 119  Temp(Src) 100.3 F (37.9 C) (Oral)  Resp 20  Ht 6\' 4"  (1.93 m)  Wt 68.9 kg (151 lb 14.4 oz)  BMI 18.49 kg/m2  SpO2 95%   PPS: 10%   Intake/Output Summary (Last 24 hours) at 03/17/11 1553 Last data filed at 03/17/11 0837  Gross per 24 hour  Intake 966.67 ml  Output   1575 ml  Net -608.33 ml   LBM: 03/14/2011                       Physical Exam:  General: Arousable to gentle verbal and tactile stimuli. In mild distress with moaning. HEENT:  He has bilateral ptosis with slowly reactive pupils. Mucous membranes are dry he has obvious temporal wasting Chest:  Decreased with coarse rhonchi bilaterally no wheezing CVS: Tachycardic positive S1 and S2 no S3 or S4 is noted no murmur rub or gallop  Abdomen: Soft nontender nondistended with positive bowel sounds Ext: Contracted without ability to move upper or lower extremities. Neuro: Intermittent wakefulness with slurred speech is not intelligible contracted and spastic upper and lower extremities  Labs: CBC    Component Value Date/Time   WBC 12.5* 03/17/2011 0615   RBC 4.22 03/17/2011 0615   HGB 12.2* 03/17/2011 0615   HCT 35.7* 03/17/2011 0615   PLT 338 03/17/2011 0615   MCV 84.6 03/17/2011 0615   MCH 28.9 03/17/2011 0615   MCHC 34.2 03/17/2011 0615   RDW 13.3 03/17/2011 0615   LYMPHSABS 1.0 03/13/2011 0500   MONOABS 0.4 03/13/2011 0500   EOSABS 0.0 03/13/2011 0500   BASOSABS 0.0 03/13/2011 0500      CMP     Component Value Date/Time   NA 133* 03/17/2011 0615   K 3.6 03/17/2011 0615   CL 98 03/17/2011 0615   CO2 23 03/17/2011 0615   GLUCOSE 86 03/17/2011 0615   BUN 5* 03/17/2011 0615   CREATININE 0.82 03/17/2011 0615   CALCIUM 9.1 03/17/2011 0615   PROT 6.5 03/12/2011 2054   ALBUMIN 3.1* 03/12/2011 2054   AST 23 03/12/2011 2054   ALT 18 03/12/2011 2054   ALKPHOS 98 03/12/2011 2054   BILITOT 0.4 03/12/2011 2054   GFRNONAA >90 03/17/2011 0615   GFRAA >90 03/17/2011 0615    Chest Xray Reviewed/Impressions: As of 3/5 no significant pulmonary findings  MRI of the brain shows diffuse demyelination with increased areas of change.    Time In Time Out Total Time Spent with Patient Total Overall Time  11:30 AM   1:15 PM   45 minutes   105 minute     Greater than 50%  of this time was spent counseling and coordinating care related to the above assessment and plan.   Terry Heatley L. Ladona Ridgel, MD  MBA The Palliative Medicine Team at [facility] Phone: [phone] Pager: [phone]    Addendum: I received a late evening call from the patient's wife who states that she spoke with the Imam, they desire to proceed with consideration for PEG tube placement if the patient is able to have this. Have not addressed his CODE  STATUS at this time we'll continue to work with him on this he remains a full code. Please note that the patient's wife had a sister with lupus who required a feeding tube in her nose she survived the life limiting illness and was threatening her and was able to have the tube removed. At this time the family does not desire to have an Panda tube in the nose they would like to go directly to gastric tube.    Starla Deller L. Ladona Ridgel, MD MBA The Palliative Medicine Team at [facility] Phone: [phone] Pager: [phone]

## 2011-03-18 ENCOUNTER — Inpatient Hospital Stay (HOSPITAL_COMMUNITY): Payer: Medicare HMO

## 2011-03-18 LAB — BASIC METABOLIC PANEL
Calcium: 9.4 mg/dL (ref 8.4–10.5)
Creatinine, Ser: 0.89 mg/dL (ref 0.50–1.35)
GFR calc Af Amer: >90 mL/min (ref >90)

## 2011-03-18 LAB — CBC
MCH: 29.2 pg (ref 26.0–34.0)
MCV: 83.8 fL (ref 78.0–100.0)
Platelets: 380 10*3/uL (ref 150–400)
RDW: 13.5 % (ref 11.5–15.5)

## 2011-03-18 MED ORDER — DEXTROSE 5 % IV SOLN
1.0000 g | Freq: Two times a day (BID) | INTRAVENOUS | Status: DC
  Administered 2011-03-18 – 2011-03-19 (×3): 1 g via INTRAVENOUS
  Filled 2011-03-18 (×4): qty 1

## 2011-03-18 MED ORDER — VANCOMYCIN HCL IN DEXTROSE 1-5 GM/200ML-% IV SOLN
1000.0000 mg | Freq: Two times a day (BID) | INTRAVENOUS | Status: DC
  Administered 2011-03-18 – 2011-03-20 (×5): 1000 mg via INTRAVENOUS
  Filled 2011-03-18 (×6): qty 200

## 2011-03-18 NOTE — Progress Notes (Addendum)
Subjective:     Objective: Vital signs in last 24 hours: Temp:  [100.1 F (37.8 C)-101.3 F (38.5 C)] 100.1 F (37.8 C) (03/10 0519) Pulse Rate:  [115-122] 115  (03/10 0519) Resp:  [17-20] 17  (03/10 0519) BP: (113-119)/(74-80) 113/74 mmHg (03/10 0519) SpO2:  [95 %-100 %] 100 % (03/10 0519) Weight:  [73.1 kg (161 lb 2.5 oz)] 73.1 kg (161 lb 2.5 oz) (03/10 0500) Weight change: 4.2 kg (9 lb 4.2 oz) Last BM Date: 03/14/11  Intake/Output from previous day: 03/09 0701 - 03/10 0700 In: 1388.3 [I.V.:1233.3; IV Piggyback:155] Out: 675 [Urine:675]     Physical Exam: Arousable Tracking with eyes Chest anterior rhonchi Unable to protect the airway choker with his own saliva Heart tachycardic and regular without murmurs Abdomen soft nontender All 4 extremities with contracture  Lab Results: Basic Metabolic Panel:  Basename 03/17/11 0615 03/16/11 0618  NA 133* 136  K 3.6 3.2*  CL 98 102  CO2 23 23  GLUCOSE 86 77  BUN 5* 4*  CREATININE 0.82 0.77  CALCIUM 9.1 8.8  MG -- --  PHOS -- --   Liver Function Tests: No results found for this basename: AST:2,ALT:2,ALKPHOS:2,BILITOT:2,PROT:2,ALBUMIN:2 in the last 72 hours No results found for this basename: LIPASE:2,AMYLASE:2 in the last 72 hours No results found for this basename: AMMONIA:2 in the last 72 hours CBC:  Basename 03/18/11 0551 03/17/11 0615  WBC 16.1* 12.5*  NEUTROABS -- --  HGB 12.6* 12.2*  HCT 36.2* 35.7*  MCV 83.8 84.6  PLT 380 338    Recent Results (from the past 240 hour(s))  URINE CULTURE     Status: Normal   Collection Time   03/12/11  2:23 PM      Component Value Range Status Comment   Specimen Description URINE, RANDOM   Final    Special Requests ADDED [id number]   Final    Culture  Setup Time [id number]   Final    Colony Count NO GROWTH   Final    Culture NO GROWTH   Final    Report Status 03/13/2011 FINAL   Final     Studies/Results: No results found. MRI brain with multiple white  matter lesions - none of them enhancing Medications: Scheduled Meds:    . interferon beta-1b  0.3 mg Subcutaneous QODAY  . levofloxacin (LEVAQUIN) IV  500 mg Intravenous Q24H  . methocarbamol(ROBAXIN) IV  500 mg Intravenous Q8H  . white petrolatum      . DISCONTD: baclofen  20 mg Oral TID  . DISCONTD: docusate sodium  100 mg Oral BID  . DISCONTD: feeding supplement  237 mL Oral TID WC  . DISCONTD: predniSONE  15 mg Oral Q breakfast   Continuous Infusions:    . dextrose 5 % and 0.45 % NaCl with KCl 40 mEq/L 50 mL/hr at 03/17/11 2139   PRN Meds:.acetaminophen, acetaminophen, alum & mag hydroxide-simeth, diphenhydrAMINE, food thickener, ibuprofen, ondansetron (ZOFRAN) IV, ondansetron Urine culture 03/01/11 enterococus Urine culture 03/12/11 no growth  Urine culture 03/18/11 pending  Blood culture 03/18/11 pending   Assessment/Plan:  1 encephalopathy - worse again today. Suspect probably due to aspiration event vs sepsis  #2 recurrent  leukocytosis / fever/tachycardia  Probably aspiration pneumonia - vs recurrent UTI vs bacteremia  Doubt meningitis - he is still waking up and responding to family Urine culture and blood cultures have been sent Started on empiric vanc, cefepime and levaquin to cover for resistant pathogens   #3 hematuria  CT showing  bladder stones  Urology consulted, recommended I and O cath .  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 Anne Hahn - He was on Tysabri and there is concern that he may have PML - but MRI does not indicate such  MRI resulted on 03/15/11 -  Shows no active MS lesions - will continue betaseron   Dysphagia and malnutrition - for now will start D1 diet with honey thick liquids  Will place  peg tube after fever resolved   Full code per family wishes after palliative care met with family   #5 prophylaxis  SCDs for DVT prophylaxis.     LOS: 6 days   Quy Lotts [facility] Pager [phone] 03/18/2011, 7:42 AM

## 2011-03-18 NOTE — Progress Notes (Addendum)
ANTIBIOTIC CONSULT NOTE - INITIAL  Pharmacy Consult for Vancomycin/Cefepime Indication: suspected PNA  No Known Allergies  Patient Measurements: Height: 6\' 4"  (193 cm) Weight: 161 lb 2.5 oz (73.1 kg) IBW/kg (Calculated) : 86.8   Vital Signs: Temp: 100.1 F (37.8 C) (03/10 0519) Temp src: Axillary (03/10 0519) BP: 113/74 mmHg (03/10 0519) Pulse Rate: 115  (03/10 0519) Intake/Output from previous day: 03/09 0701 - 03/10 0700 In: 1388.3 [I.V.:1233.3; IV Piggyback:155] Out: 675 [Urine:675] Intake/Output from this shift:    Labs:  Basename 03/18/11 0551 03/17/11 0615 03/16/11 0618  WBC 16.1* 12.5* 10.7*  HGB 12.6* 12.2* 11.4*  PLT 380 338 284  LABCREA -- -- --  CREATININE 0.89 0.82 0.77   Estimated Creatinine Clearance: 117.5 ml/min (by C-G formula based on Cr of 0.89). No results found for this basename: VANCOTROUGH:2,VANCOPEAK:2,VANCORANDOM:2,GENTTROUGH:2,GENTPEAK:2,GENTRANDOM:2,TOBRATROUGH:2,TOBRAPEAK:2,TOBRARND:2,AMIKACINPEAK:2,AMIKACINTROU:2,AMIKACIN:2, in the last 72 hours   Microbiology: Recent Results (from the past 720 hour(s))  URINE CULTURE     Status: Normal   Collection Time   03/01/11  3:13 PM      Component Value Range Status Comment   Specimen Description URINE, CATHETERIZED   Final    Special Requests none Normal   Final    Culture  Setup Time [id number]   Final    Colony Count >=100,000 COLONIES/ML   Final    Culture ENTEROCOCCUS SPECIES   Final    Report Status 03/03/2011 FINAL   Final    Organism ID, Bacteria ENTEROCOCCUS SPECIES   Final   URINE CULTURE     Status: Normal   Collection Time   03/12/11  2:23 PM      Component Value Range Status Comment   Specimen Description URINE, RANDOM   Final    Special Requests ADDED [id number]   Final    Culture  Setup Time [id number]   Final    Colony Count NO GROWTH   Final    Culture NO GROWTH   Final    Report Status 03/13/2011 FINAL   Final     Medical History: Past Medical History  Diagnosis  Date  . MS (multiple sclerosis)   . UTI (urinary tract infection)   . Childhood asthma   . 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 Neuromuscular disorder     Quadraperesis  . Depression     Medications:  Prescriptions prior to admission  Medication Sig Dispense Refill  . baclofen (LIORESAL) 10 MG tablet Take 20 mg by mouth 3 (three) times daily.       Marland Kitchen buPROPion (WELLBUTRIN SR) 150 MG 12 hr tablet Take 150 mg by mouth 2 (two) times daily.      . cholecalciferol (VITAMIN D) 1000 UNITS tablet Take 1,000 Units by mouth daily.        Marland Kitchen docusate sodium (COLACE) 100 MG capsule Take 100 mg by mouth 2 (two) times daily.      . feeding supplement (ENSURE CLINICAL STRENGTH) LIQD Take 237 mLs by mouth 3 (three) times daily with meals.      . fish oil-omega-3 fatty acids 1000 MG capsule Take 1,200 mg by mouth daily.        . Garlic 1000 MG CAPS Take 1,000 mg by mouth daily.        Marland Kitchen ibuprofen (ADVIL,MOTRIN) 200 MG tablet Take 200 mg by mouth every 6 (six) hours as needed. For pain and fever      . interferon beta-1b (BETASERON) 0.3 MG injection Inject 0.3 mg into the skin every other  day. Next dose due 03/13/2011      . nitrofurantoin, macrocrystal-monohydrate, (MACROBID) 100 MG capsule Take 100 mg by mouth 2 (two) times daily.      . predniSONE (STERAPRED UNI-PAK) 5 MG TABS Take 15 mg by mouth daily.      Marland Kitchen senna (SENOKOT) 8.6 MG TABS Take 1 tablet (8.6 mg total) by mouth 2 (two) times daily. Hold for diarrhea.  60 each  0  . vitamin B-12 (CYANOCOBALAMIN) 1000 MCG tablet Take 1,000 mcg by mouth daily.       . cephALEXin (KEFLEX) 500 MG capsule Take 1 capsule (500 mg total) by mouth 4 (four) times daily.  40 capsule  0   Assessment: 36 y.o. Male with multiple sclerosis/quadriparesis to begin Vancomycin Day #1  and cefepime for suspected PNA. Pt already on Levaquin Day #2 to cover asp pna. Tm 101.3. Pt with normal Creat but UOP only 675/24h. Creatinine may not be reflective of renal function in pt  with MS and quadriparesis. Recent cultures ngtd or neg.  Goal of Therapy:  Vancomycin trough level 15-20 mcg/ml  Plan:  1. Vancomycin 1 gm IV q12h 2. Cefepime 1g IV q12h. 3. F/u UOP, cultures, and will check level at Css  Christoper Fabian, PharmD, BCPS Clinical pharmacist, pager [phone] 03/18/2011,8:32 AM  Misty Stanley, PharmD, BCPS 03/18/11, 1008

## 2011-03-18 NOTE — Progress Notes (Signed)
Visited briefly with [name] today.  He was able to open is eyes and maintain contact better than yesterday but he is still not able to speak clearly.  His wife and mother were not at bedside.  I conveyed to Dr. Lavera Guise the desire for placement of a PEG tube.  I will engage his wife and mother at a later time to follow up our discussion regarding code status.  At present they desire full code status.  Agree with work up for pneumonia.  [name] L. Ladona Ridgel, MD MBA The Palliative Medicine Team at [facility] Phone: [phone] Pager: [phone]

## 2011-03-19 DIAGNOSIS — G9340 Encephalopathy, unspecified: Secondary | ICD-10-CM

## 2011-03-19 DIAGNOSIS — G35 Multiple sclerosis: Principal | ICD-10-CM

## 2011-03-19 DIAGNOSIS — N390 Urinary tract infection, site not specified: Secondary | ICD-10-CM

## 2011-03-19 LAB — CBC
Hemoglobin: 11.9 g/dL — ABNORMAL LOW (ref 13.0–17.0)
MCH: 29.1 pg (ref 26.0–34.0)
RBC: 4.09 MIL/uL — ABNORMAL LOW (ref 4.22–5.81)

## 2011-03-19 LAB — BASIC METABOLIC PANEL
CO2: 18 mEq/L — ABNORMAL LOW (ref 19–32)
Calcium: 9.2 mg/dL (ref 8.4–10.5)
Chloride: 102 mEq/L (ref 96–112)
Glucose, Bld: 105 mg/dL — ABNORMAL HIGH (ref 70–99)
Sodium: 133 mEq/L — ABNORMAL LOW (ref 135–145)

## 2011-03-19 MED ORDER — PIPERACILLIN-TAZOBACTAM 3.375 G IVPB 30 MIN
3.3750 g | INTRAVENOUS | Status: AC
  Administered 2011-03-19: 3.375 g via INTRAVENOUS
  Filled 2011-03-19: qty 50

## 2011-03-19 MED ORDER — SODIUM CHLORIDE 0.9 % IV BOLUS (SEPSIS)
1000.0000 mL | Freq: Once | INTRAVENOUS | Status: AC
  Administered 2011-03-19: 1000 mL via INTRAVENOUS

## 2011-03-19 MED ORDER — SODIUM CHLORIDE 0.9 % IV SOLN
INTRAVENOUS | Status: DC
  Administered 2011-03-19 – 2011-03-20 (×2): via INTRAVENOUS

## 2011-03-19 MED ORDER — PIPERACILLIN-TAZOBACTAM 3.375 G IVPB
3.3750 g | Freq: Three times a day (TID) | INTRAVENOUS | Status: DC
  Administered 2011-03-20 (×2): 3.375 g via INTRAVENOUS
  Filled 2011-03-19 (×3): qty 50

## 2011-03-19 NOTE — Progress Notes (Signed)
Nutrition Follow-up  Diet Order:  Dysphagia 1 with honey thick liquids.  Patient continues to have difficulty with swallowing, likely PNA from aspirations per MD notes. Palliative is now involved and family would like a PEG tube placed. MD notes it will be placed when fevers are resolved.    Meds: Scheduled Meds:   . ceFEPime (MAXIPIME) IV  1 g Intravenous Q12H  . interferon beta-1b  0.3 mg Subcutaneous QODAY  . levofloxacin (LEVAQUIN) IV  500 mg Intravenous Q24H  . methocarbamol(ROBAXIN) IV  500 mg Intravenous Q8H  . vancomycin  1,000 mg Intravenous Q12H   Continuous Infusions:   . dextrose 5 % and 0.45 % NaCl with KCl 40 mEq/L 50 mL/hr at 03/19/11 0023   PRN Meds:.acetaminophen, acetaminophen, alum & mag hydroxide-simeth, diphenhydrAMINE, food thickener, ibuprofen, ondansetron (ZOFRAN) IV, ondansetron  Labs:  CMP     Component Value Date/Time   NA 133* 03/19/2011 0615   K 3.8 03/19/2011 0615   CL 102 03/19/2011 0615   CO2 18* 03/19/2011 0615   GLUCOSE 105* 03/19/2011 0615   BUN 6 03/19/2011 0615   CREATININE 0.81 03/19/2011 0615   CALCIUM 9.2 03/19/2011 0615   PROT 6.5 03/12/2011 2054   ALBUMIN 3.1* 03/12/2011 2054   AST 23 03/12/2011 2054   ALT 18 03/12/2011 2054   ALKPHOS 98 03/12/2011 2054   BILITOT 0.4 03/12/2011 2054   GFRNONAA >90 03/19/2011 0615   GFRAA >90 03/19/2011 0615     Intake/Output Summary (Last 24 hours) at 03/19/11 0951 Last data filed at 03/19/11 0600  Gross per 24 hour  Intake   2070 ml  Output    800 ml  Net   1270 ml    Weight Status:  183 lbs, significant increase over night, ? Accuracy of this weight. Weight does appear to be trending up.   Re-estimated needs:  1975-2175 kcal, 80-100 gm protein  Nutrition Dx:  Inadequate oral intake, continues  Goal:  PO intake of meals and supplements to meet >90% of estimated needs, unmet, D/C New Goal: >90% of estimated nutrition needs will be met via EN once PEG is placed  Intervention:   1. Recommend if patient is  to be NPO, Jevity 1.2 at a goal rate of 70 ml/hr via PEG tube. This will provide 2016 kcal, 93 gm protein, and 1355 ml free water.  2. Recommend 150 ml free water bolus every 4 hours.   Monitor:  PEG placement, weight, labs, I/O's   Rudean Haskell Pager #:  [phone]

## 2011-03-19 NOTE — Progress Notes (Signed)
Speech Language/Pathology Speech Pathology: Dysphagia Treatment Note  Patient was observed with : Pureed and Honey liquids.  Patient was noted to have s/s of aspiration : Yes:  Weak cough, no vocal fold adduction  Lung Sounds:  Clear diminished Temperature: 99-101  Patient has been observed to have fluctuating function from nursing staff, sometimes with overt aspiration of solids and sometimes tolerating meals well. Pt is now on a Dys 1 puree diet and honey thick liquids, pt has been requesting bites/sips. Plan for now is to proceed with PEG tube and allow comfort feeds as pt is able.  SLP observed pt with bites of honey thick liquids via teaspoon, initially slightly delayed oral transit and delayed swallow response. Delay increased with each trial. Switched to magic cup, pt continue to consume functionally, intermittently requiring oral suction of secretions mixed with residue. Pt requested thick ensure, pt with ineffective cough response with first teaspoon presentation.   Clinical Impression: Pt continue to present with fluctuating function with poor endurance. Suspect residuals that build in pharynx after several bites sips causing cough response. If pt/family proceed with PEG agree that pt should be allowed bites/sips of MOIST puree and honey thick liquids as tolerated. Provided education to wife who has experience with thickened liquids.   Recommendations:  Continue current diet and precautions, posted  Pain:   none Intervention Required:   No   Goals: Goals Partially Met  Harlon Ditty, MA CCC-SLP [phone]

## 2011-03-19 NOTE — Consult Note (Signed)
[facility] Consult: 11:53 AM 03/19/2011   Referring Provider: Lonia Blood, MD Primary Care Physician:  Dorrene German, MD Primary Gastroenterologist:  None    Reason for Consultation:  PEG placement  HPI: Terry Palmer is a 36 y.o. male.  Has 10 year hx MS with spasticity and limb weakness.  Hospitalized 1 week ago acute confusion, hematuria and leukocytosis. He has history of recurrent UTIs. On CT scan he has bladder stones. His MS has reached end-stage with severe dysphagia confirmed by speech pathology study performed today at bedside. Patient has been approved for pured diet and honey liquids. His fluctuating mental status and general poor in duress put him at high risk for aspiration. He is noted to have cough response to suspected residuals in the pharynx. He has not had formal modified barium swallow study study.  His wife and other family members desire him to have a feeding tube placed. He has never seen a GI doctor before.    Past Medical History  Diagnosis Date  . MS (multiple sclerosis)   . UTI (urinary tract infection)   . Childhood asthma   . 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 Neuromuscular disorder     Quadraperesis  . Depression     Past Surgical History  Procedure Date  . Lumbar puncture 10/12/2002    Prior to Admission medications   Medication Sig Start Date End Date Taking? Authorizing Provider  baclofen (LIORESAL) 10 MG tablet Take 20 mg by mouth 3 (three) times daily.    Yes Historical Provider, MD  buPROPion (WELLBUTRIN SR) 150 MG 12 hr tablet Take 150 mg by mouth 2 (two) times daily.   Yes Historical Provider, MD  cholecalciferol (VITAMIN D) 1000 UNITS tablet Take 1,000 Units by mouth daily.     Yes Historical Provider, MD  docusate sodium (COLACE) 100 MG capsule Take 100 mg by mouth 2 (two) times daily.   Yes Historical Provider, MD  feeding supplement (ENSURE CLINICAL STRENGTH) LIQD Take 237 mLs by mouth  3 (three) times daily with meals. 02/05/11  Yes Srikar Cherlynn Kaiser, MD  fish oil-omega-3 fatty acids 1000 MG capsule Take 1,200 mg by mouth daily.     Yes Historical Provider, MD  Garlic 1000 MG CAPS Take 1,000 mg by mouth daily.     Yes Historical Provider, MD  ibuprofen (ADVIL,MOTRIN) 200 MG tablet Take 200 mg by mouth every 6 (six) hours as needed. For pain and fever   Yes Historical Provider, MD  interferon beta-1b (BETASERON) 0.3 MG injection Inject 0.3 mg into the skin every other day. Next dose due 03/13/2011   Yes Historical Provider, MD  predniSONE (STERAPRED UNI-PAK) 5 MG TABS Take 15 mg by mouth daily.   Yes Historical Provider, MD  senna (SENOKOT) 8.6 MG TABS Take 1 tablet (8.6 mg total) by mouth 2 (two) times daily. Hold for diarrhea. 02/05/11  Yes Srikar Cherlynn Kaiser, MD  vitamin B-12 (CYANOCOBALAMIN) 1000 MCG tablet Take 1,000 mcg by mouth daily.    Yes Historical Provider, MD    Scheduled Meds:    . ceFEPime (MAXIPIME) IV  1 g Intravenous Q12H  . interferon beta-1b  0.3 mg Subcutaneous QODAY  . levofloxacin (LEVAQUIN) IV  500 mg Intravenous Q24H  . methocarbamol(ROBAXIN) IV  500 mg Intravenous Q8H  . vancomycin  1,000 mg Intravenous Q12H   Infusions:    . dextrose 5 % and 0.45 % NaCl with KCl 40 mEq/L 50 mL/hr at 03/19/11 0023   PRN Meds:  acetaminophen, acetaminophen, alum & mag hydroxide-simeth, diphenhydrAMINE, food thickener, ibuprofen, ondansetron (ZOFRAN) IV, ondansetron   Allergies as of 03/12/2011  . (No Known Allergies)    Family History  Problem Relation Age of Onset  . Asthma Mother     History   Social History  . Marital Status: Married    Spouse Name: N/A    Number of Children: N/A  . Years of Education: N/A   Occupational History  . Not on file.   Social History Main Topics  . Smoking status: Former Smoker    Types: Cigarettes    Quit date: 02/02/1999  . Smokeless tobacco: Former Neurosurgeon  . Alcohol Use: No  . Drug Use: Yes    Special: Marijuana,  Cocaine     Former use  . Sexually Active: No   Other Topics Concern  . Not on file   Social History Narrative   The patient currently lives at home with his wife and kids, who help him with his ADL's.  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  The patient currently has [facility], and is in the process of reapplying for Medicaid (12/2010). [name] (cell) [phone]; (home) [phone],  [name] (cell) [phone] (home) [phone]    REVIEW OF SYSTEMS: Constitutional:  Significant weight loss of 30 or more pounds over the past couple of years ENT:  No nosebleeds Pulm:  Cough is weak. CV:  nno chest pain or palpitations.2 GU:  Patient has difficulty voiding and when he does try to avoid he has a spastic effort in the abdomen. Family reports frequent UTIs. GI:  Constipation is a chronic problem. Family has not yet resorted to manual disimpaction, they use stool softeners or laxatives and not very often will use enemas Heme:  No history of any.    Transfusions:  none Neuro:  Patient is essentially bedridden. Derm:  No source or rash Endocrine:  No history diabetes or thyroid disease Immunization:  Status of vaccinations unknown. Travel:  None generally stays put in [place]   PHYSICAL EXAM: Vital signs in last 24 hours: Temp:  [99.4 F (37.4 C)-101.5 F (38.6 C)] 99.4 F (37.4 C) (03/11 0500) Pulse Rate:  [111-115] 111  (03/11 0500) Resp:  [18-20] 20  (03/11 0500) BP: (106-118)/(72-79) 118/79 mmHg (03/11 0500) SpO2:  [98 %-100 %] 98 % (03/11 0500) Weight:  [183 lb 3.2 oz (83.1 kg)] 183 lb 3.2 oz (83.1 kg) (03/11 0500)  General: cachectic, chronically ill-appearing man who looks about his stated age Head:  Temporal wasting evident  Eyes:  No scleral icterus or pallor of the conjunctiva Ears:  Unable to assess hearing, he is not responding to my voice but does respond to his  mothers voice Nose:  No discharge Mouth:  Mucous membranes are moist, pink and clear. Teeth are in good repair Neck:  No masses or JVD Lungs:  Clear to auscultation and percussion in front Heart: regular rate and rhythm no murmurs rubs or gallops Abdomen:  Thin. Active bowel sounds.   There is some fullness in the upper abdomen near the right upper quadrant unable to tell whether this is the liver and were just stool. Rectal: not perform   Musc/Skeltl:  contracted at limbs Extremities: no pedal edema. Neurologic:  Spasticity of the limbs and head Skin:  No rash or sore Tattoos:  None see Nodes:  None of the neck   Psych:  Unable to  assess as the patient is not verbal for me  Intake/Output from previous day: 03/10 0701 - 03/11 0700 In: 2070 [I.V.:1200; IV Piggyback:870] Out: 800 [Urine:800] Intake/Output this shift: Total I/O In: 120 [P.O.:120] Out: -   LAB RESULTS:  Basename 03/19/11 0615 03/18/11 0551 03/17/11 0615  WBC 13.4* 16.1* 12.5*  HGB 11.9* 12.6* 12.2*  HCT 34.0* 36.2* 35.7*  PLT 392 380 338   BMET Lab Results  Component Value Date   NA 133* 03/19/2011   NA 132* 03/18/2011   NA 133* 03/17/2011   K 3.8 03/19/2011   K 4.1 03/18/2011   K 3.6 03/17/2011   CL 102 03/19/2011   CL 99 03/18/2011   CL 98 03/17/2011   CO2 18* 03/19/2011   CO2 20 03/18/2011   CO2 23 03/17/2011   GLUCOSE 105* 03/19/2011   GLUCOSE 107* 03/18/2011   GLUCOSE 86 03/17/2011   BUN 6 03/19/2011   BUN 7 03/18/2011   BUN 5* 03/17/2011   CREATININE 0.81 03/19/2011   CREATININE 0.89 03/18/2011   CREATININE 0.82 03/17/2011   CALCIUM 9.2 03/19/2011   CALCIUM 9.4 03/18/2011   CALCIUM 9.1 03/17/2011   LFT No results found for this basename: PROT:3,ALBUMIN:3,AST:3,ALT:3,ALKPHOS:3,BILITOT:3,BILIDIR:3,IBILI:3 in the last 72 hours PT/INR Lab Results  Component Value Date   INR 1.09 03/12/2011   RADIOLOGY STUDIES: Dg Chest Port 1 View  03/18/2011  *RADIOLOGY REPORT*  Clinical Data: Aspiration.  PORTABLE CHEST - 1 VIEW   Comparison: Chest x-ray 03/13/2011.  Findings: Lung volumes are low.  No consolidative airspace disease. No pleural effusions.  Pulmonary vasculature and the cardiomediastinal silhouette are within normal limits.  IMPRESSION:  1.  No radiographic evidence of acute cardiopulmonary disease.  Low lung volumes.  Original Report Authenticated By: Florencia Reasons, M.D.    ENDOSCOPIC STUDIES: None   IMPRESSION: 1.  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 2.  Recurrent UTIs.  Now with hematuria and bladder stones. Urine culture is pending 3. Normocytic anemia  PLAN: 1.  Peg tomorrow.  Risks and benefits discussed with patient's family and they agreed to proceed. His wife and mother were both in the room during the exam and discussion   LOS: 7 days   Jennye Moccasin  03/19/2011, 11:53 AM Pager: [phone]

## 2011-03-19 NOTE — Progress Notes (Signed)
ANTIBIOTIC CONSULT NOTE - FOLLOW UP  Pharmacy Consult for Zosyn Indication: Empiric - recurrent fevers  No Known Allergies  Patient Measurements: Height: 6\' 4"  (193 cm) Weight: 183 lb 3.2 oz (83.1 kg) (new air overlay bed) IBW/kg (Calculated) : 86.8  Adjusted Body Weight:   Vital Signs: Temp: 101.7 F (38.7 C) (03/11 1525) Temp src: Rectal (03/11 1525) BP: 115/77 mmHg (03/11 1401) Pulse Rate: 121  (03/11 1401) Intake/Output from previous day: 03/10 0701 - 03/11 0700 In: 2070 [I.V.:1200; IV Piggyback:870] Out: 800 [Urine:800] Intake/Output from this shift: Total I/O In: 120 [P.O.:120] Out: -   Labs:  Basename 03/19/11 0615 03/18/11 0551 03/17/11 0615  WBC 13.4* 16.1* 12.5*  HGB 11.9* 12.6* 12.2*  PLT 392 380 338  LABCREA -- -- --  CREATININE 0.81 0.89 0.82   Estimated Creatinine Clearance: 146.8 ml/min (by C-G formula based on Cr of 0.81). No results found for this basename: VANCOTROUGH:2,VANCOPEAK:2,VANCORANDOM:2,GENTTROUGH:2,GENTPEAK:2,GENTRANDOM:2,TOBRATROUGH:2,TOBRAPEAK:2,TOBRARND:2,AMIKACINPEAK:2,AMIKACINTROU:2,AMIKACIN:2, in the last 72 hours   Microbiology: Recent Results (from the past 720 hour(s))  URINE CULTURE     Status: Normal   Collection Time   03/01/11  3:13 PM      Component Value Range Status Comment   Specimen Description URINE, CATHETERIZED   Final    Special Requests none Normal   Final    Culture  Setup Time [id number]   Final    Colony Count >=100,000 COLONIES/ML   Final    Culture ENTEROCOCCUS SPECIES   Final    Report Status 03/03/2011 FINAL   Final    Organism ID, Bacteria ENTEROCOCCUS SPECIES   Final   URINE CULTURE     Status: Normal   Collection Time   03/12/11  2:23 PM      Component Value Range Status Comment   Specimen Description URINE, RANDOM   Final    Special Requests ADDED [id number]   Final    Culture  Setup Time [id number]   Final    Colony Count NO GROWTH   Final    Culture NO GROWTH   Final    Report Status  03/13/2011 FINAL   Final   URINE CULTURE     Status: Normal (Preliminary result)   Collection Time   03/18/11  8:26 AM      Component Value Range Status Comment   Specimen Description URINE, RANDOM   Final    Special Requests NONE   Final    Culture  Setup Time [id number]   Final    Colony Count >=100,000 COLONIES/ML   Final    Culture ENTEROCOCCUS SPECIES   Final    Report Status PENDING   Incomplete   CULTURE, BLOOD (ROUTINE X 2)     Status: Normal (Preliminary result)   Collection Time   03/18/11 11:20 AM      Component Value Range Status Comment   Specimen Description BLOOD RIGHT ARM   Final    Special Requests BOTTLES DRAWN AEROBIC AND ANAEROBIC 10CC   Final    Culture  Setup Time [id number]   Final    Culture     Final    Value:        BLOOD CULTURE RECEIVED NO GROWTH TO DATE CULTURE WILL BE HELD FOR 5 DAYS BEFORE ISSUING A FINAL NEGATIVE REPORT   Report Status PENDING   Incomplete   CULTURE, BLOOD (ROUTINE X 2)     Status: Normal (Preliminary result)   Collection Time   03/18/11 11:25 AM  Component Value Range Status Comment   Specimen Description BLOOD RIGHT HAND   Final    Special Requests BOTTLES DRAWN AEROBIC AND ANAEROBIC 10CC   Final    Culture  Setup Time [id number]   Final    Culture     Final    Value:        BLOOD CULTURE RECEIVED NO GROWTH TO DATE CULTURE WILL BE HELD FOR 5 DAYS BEFORE ISSUING A FINAL NEGATIVE REPORT   Report Status PENDING   Incomplete     Anti-infectives     Start     Dose/Rate Route Frequency Ordered Stop   03/18/11 1100   ceFEPIme (MAXIPIME) 1 g in dextrose 5 % 50 mL IVPB  Status:  Discontinued        1 g 100 mL/hr over 30 Minutes Intravenous Every 12 hours 03/18/11 1009 03/19/11 1624   03/18/11 0900   vancomycin (VANCOCIN) IVPB 1000 mg/200 mL premix        1,000 mg 200 mL/hr over 60 Minutes Intravenous Every 12 hours 03/18/11 0838     03/17/11 1700   levofloxacin (LEVAQUIN) IVPB 500 mg  Status:  Discontinued        500  mg 100 mL/hr over 60 Minutes Intravenous Every 24 hours 03/17/11 1625 03/19/11 1624          Assessment: 35yom to change antibiotic coverage from Cefepime/Levaquin to Zosyn for empiric coverage of recurrent fevers (aspiration PNA vs UTI vs bacteremia). Patient is also receiving Vancomycin. Wt 83kg, Crcl >100 (may not be reflective of renal function in pt with MS and quadriparesis).   Goal of Therapy:  Clinical improvement  Plan:  1. Zosyn 3.375g IV q8h -  Infuse over 4hrs 2. Monitor renal function, cultures, clinical course and adjust as indicated  Cleon Dew [phone] 03/19/2011,5:51 PM

## 2011-03-19 NOTE — Progress Notes (Signed)
Subjective:  Better today    Objective: Vital signs in last 24 hours: Temp:  [99.4 F (37.4 C)-101.5 F (38.6 C)] 99.4 F (37.4 C) (03/11 0500) Pulse Rate:  [111-115] 111  (03/11 0500) Resp:  [18-20] 20  (03/11 0500) BP: (106-118)/(72-79) 118/79 mmHg (03/11 0500) SpO2:  [98 %-100 %] 98 % (03/11 0500) Weight:  [83.1 kg (183 lb 3.2 oz)] 83.1 kg (183 lb 3.2 oz) (03/11 0500) Weight change: 10 kg (22 lb 0.7 oz) Last BM Date: 03/14/11  Patient Vitals for the past 24 hrs:  BP Temp Temp src Pulse Resp SpO2 Weight  03/19/11 0500 118/79 mmHg 99.4 F (37.4 C) Oral 111  20  98 % 83.1 kg (183 lb 3.2 oz)  03/18/11 2115 106/72 mmHg 101 F (38.3 C) Axillary 111  18  99 % -  03/18/11 1400 111/73 mmHg 101.5 F (38.6 C) Axillary 115  20  100 % -     Intake/Output from previous day: 03/10 0701 - 03/11 0700 In: 2070 [I.V.:1200; IV Piggyback:870] Out: 800 [Urine:800]     Physical Exam: Arousable Tracking with eyes Chest anterior rhonchi Unable to protect the airway choker with his own saliva Heart tachycardic and regular without murmurs Abdomen soft nontender All 4 extremities with contracture  Lab Results: Basic Metabolic Panel:  Basename 03/19/11 0615 03/18/11 0551  NA 133* 132*  K 3.8 4.1  CL 102 99  CO2 18* 20  GLUCOSE 105* 107*  BUN 6 7  CREATININE 0.81 0.89  CALCIUM 9.2 9.4  MG -- --  PHOS -- --   Liver Function Tests: No results found for this basename: AST:2,ALT:2,ALKPHOS:2,BILITOT:2,PROT:2,ALBUMIN:2 in the last 72 hours No results found for this basename: LIPASE:2,AMYLASE:2 in the last 72 hours No results found for this basename: AMMONIA:2 in the last 72 hours CBC:  Basename 03/19/11 0615 03/18/11 0551  WBC 13.4* 16.1*  NEUTROABS -- --  HGB 11.9* 12.6*  HCT 34.0* 36.2*  MCV 83.1 83.8  PLT 392 380    Recent Results (from the past 240 hour(s))  URINE CULTURE     Status: Normal   Collection Time   03/12/11  2:23 PM      Component Value Range Status  Comment   Specimen Description URINE, RANDOM   Final    Special Requests ADDED [id number]   Final    Culture  Setup Time [id number]   Final    Colony Count NO GROWTH   Final    Culture NO GROWTH   Final    Report Status 03/13/2011 FINAL   Final   CULTURE, BLOOD (ROUTINE X 2)     Status: Normal (Preliminary result)   Collection Time   03/18/11 11:20 AM      Component Value Range Status Comment   Specimen Description BLOOD RIGHT ARM   Final    Special Requests BOTTLES DRAWN AEROBIC AND ANAEROBIC 10CC   Final    Culture  Setup Time [id number]   Final    Culture     Final    Value:        BLOOD CULTURE RECEIVED NO GROWTH TO DATE CULTURE WILL BE HELD FOR 5 DAYS BEFORE ISSUING A FINAL NEGATIVE REPORT   Report Status PENDING   Incomplete   CULTURE, BLOOD (ROUTINE X 2)     Status: Normal (Preliminary result)   Collection Time   03/18/11 11:25 AM      Component Value Range Status Comment   Specimen Description BLOOD RIGHT HAND  Final    Special Requests BOTTLES DRAWN AEROBIC AND ANAEROBIC 10CC   Final    Culture  Setup Time [id number]   Final    Culture     Final    Value:        BLOOD CULTURE RECEIVED NO GROWTH TO DATE CULTURE WILL BE HELD FOR 5 DAYS BEFORE ISSUING A FINAL NEGATIVE REPORT   Report Status PENDING   Incomplete     Studies/Results: Dg Chest Port 1 View  03/18/2011  *RADIOLOGY REPORT*  Clinical Data: Aspiration.  PORTABLE CHEST - 1 VIEW  Comparison: Chest x-ray 03/13/2011.  Findings: Lung volumes are low.  No consolidative airspace disease. No pleural effusions.  Pulmonary vasculature and the cardiomediastinal silhouette are within normal limits.  IMPRESSION:  1.  No radiographic evidence of acute cardiopulmonary disease.  Low lung volumes.  Original Report Authenticated By: Florencia Reasons, M.D.   MRI brain with multiple white matter lesions - none of them enhancing Medications: Scheduled Meds:    . ceFEPime (MAXIPIME) IV  1 g Intravenous Q12H  . interferon  beta-1b  0.3 mg Subcutaneous QODAY  . levofloxacin (LEVAQUIN) IV  500 mg Intravenous Q24H  . methocarbamol(ROBAXIN) IV  500 mg Intravenous Q8H  . vancomycin  1,000 mg Intravenous Q12H   Continuous Infusions:    . dextrose 5 % and 0.45 % NaCl with KCl 40 mEq/L 50 mL/hr at 03/19/11 0023   PRN Meds:.acetaminophen, acetaminophen, alum & mag hydroxide-simeth, diphenhydrAMINE, food thickener, ibuprofen, ondansetron (ZOFRAN) IV, ondansetron Urine culture 03/01/11 enterococus Urine culture 03/12/11 no growth  Urine culture 03/18/11 pending  Blood culture 03/18/11 pending   Assessment/Plan:  1 encephalopathy - waxing and waning course - related to fever   #2 recurrent  leukocytosis / fever/tachycardia  Probably aspiration pneumonia - vs recurrent UTI vs bacteremia . CXR looking pretty good.  Doubt meningitis - he is still waking up and responding to family Urine culture and blood cultures have been sent Started on empiric vanc, cefepime and levaquin to cover for resistant pathogens   #3 hematuria  CT showing bladder stones  Urology consulted, recommended I and O cath .  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 Anne Hahn - he recommended peg tube He was on Tysabri and there is concern that he may have PML - but MRI does not indicate such  MRI resulted on 03/15/11 -  Shows no active MS lesions - will continue betaseron   Dysphagia and malnutrition - for now will start D1 diet with honey thick liquids  Will place peg tube after fever resolved   Full code per family wishes after palliative care met with family   #5 prophylaxis  SCDs for DVT prophylaxis.     LOS: 7 days   Deshaun Schou [facility] Pager [phone] 03/19/2011, 8:02  AM

## 2011-03-19 NOTE — Consult Note (Signed)
Patient seen, examined, and I agree with the above documentation, including the assessment and plan. Febrile this pm to 103F, concern is for UTI with resistant organism, ? VRE I discussed PEG with family (Wife, mother) including the risks/benefits/alternative, and they are agreeable to proceed.  They also state patient is agreeable.  Pt somnolent at present with fever and therefore I could not discuss with him. I would like defervescence before proceeding with PEG placement. Will re-eval tomorrow. Could consider NG for feedings if needed today.

## 2011-03-20 DIAGNOSIS — B952 Enterococcus as the cause of diseases classified elsewhere: Secondary | ICD-10-CM

## 2011-03-20 DIAGNOSIS — N390 Urinary tract infection, site not specified: Secondary | ICD-10-CM | POA: Diagnosis present

## 2011-03-20 DIAGNOSIS — R633 Feeding difficulties: Secondary | ICD-10-CM

## 2011-03-20 DIAGNOSIS — E46 Unspecified protein-calorie malnutrition: Secondary | ICD-10-CM

## 2011-03-20 LAB — CBC
HCT: 34.4 % — ABNORMAL LOW (ref 39.0–52.0)
Hemoglobin: 11.6 g/dL — ABNORMAL LOW (ref 13.0–17.0)
MCH: 28.4 pg (ref 26.0–34.0)
MCV: 84.3 fL (ref 78.0–100.0)
Platelets: 413 10*3/uL — ABNORMAL HIGH (ref 150–400)
RBC: 4.08 MIL/uL — ABNORMAL LOW (ref 4.22–5.81)

## 2011-03-20 LAB — BASIC METABOLIC PANEL
BUN: 6 mg/dL (ref 6–23)
CO2: 19 mEq/L (ref 19–32)
Calcium: 9.2 mg/dL (ref 8.4–10.5)
Creatinine, Ser: 0.83 mg/dL (ref 0.50–1.35)
Glucose, Bld: 88 mg/dL (ref 70–99)

## 2011-03-20 LAB — URINE CULTURE

## 2011-03-20 MED ORDER — DEXTROSE-NACL 5-0.9 % IV SOLN
INTRAVENOUS | Status: DC
  Administered 2011-03-20: 1000 mL via INTRAVENOUS

## 2011-03-20 MED ORDER — SODIUM CHLORIDE 0.9 % IV SOLN
3.0000 g | Freq: Four times a day (QID) | INTRAVENOUS | Status: DC
  Administered 2011-03-20 – 2011-03-26 (×24): 3 g via INTRAVENOUS
  Filled 2011-03-20 (×26): qty 3

## 2011-03-20 NOTE — Progress Notes (Signed)
I agree with the above documentation, including the assessment and plan. Overall improvement on treatment on enterococcal UTI. Last fever to this point, yesterday pm. Will plan PEG for tomorrow am.  I would like for him to be at least 24 hour out from last fever.

## 2011-03-20 NOTE — Progress Notes (Signed)
03/20/2011 Palliative Medicine Team SW 11:17 AM Pt discussed in PMT Rounds, referred for psychosocial support. Met with pt and wife at bedside; both in good spirits about perceived improvement. Pt alert, nodding and attentive to conversation. Wife expressed questions about feeding tube placement; RN to follow up.   Discussed family's social support network including friends, family, and faith community. Wife expressed difficulty with pt's increased medical complications. Provided information on [facility] services for 10 and 75 y.o. Children. Family very much focused on improvement and moving forward, deny any acute emotional or practical needs at this time. Provided contact information as additional needs arise. Available as needed.   Kennieth Francois, [place] Pager [phone]

## 2011-03-20 NOTE — Progress Notes (Addendum)
Subjective:  Better today    Objective: Vital signs in last 24 hours: Temp:  [98.2 F (36.8 C)-103.3 F (39.6 C)] 98.4 F (36.9 C) (03/12 0435) Pulse Rate:  [98-121] 98  (03/12 0435) Resp:  [18-19] 18  (03/12 0435) BP: (104-119)/(71-80) 119/80 mmHg (03/12 0435) SpO2:  [90 %-100 %] 100 % (03/12 0435) Weight:  [87 kg (191 lb 12.8 oz)] 87 kg (191 lb 12.8 oz) (03/12 0435) Weight change: 3.9 kg (8 lb 9.6 oz) Last BM Date: 03/14/11 (has been NPO )  Patient Vitals for the past 24 hrs:  BP Temp Temp src Pulse Resp SpO2 Weight  03/20/11 0435 119/80 mmHg 98.4 F (36.9 C) Oral 98  18  100 % 87 kg (191 lb 12.8 oz)  03/19/11 2201 104/71 mmHg 99.2 F (37.3 C) Oral 119  19  90 % -  03/19/11 1630 - 99.7 F (37.6 C) Rectal - - - -  03/19/11 1525 - 101.7 F (38.7 C) Rectal - - - -  03/19/11 1419 - 103.3 F (39.6 C) Rectal - - - -  03/19/11 1401 115/77 mmHg 98.2 F (36.8 C) - 121  19  100 % -     Intake/Output from previous day: 03/11 0701 - 03/12 0700 In: 591.7 [P.O.:120; I.V.:116.7; IV Piggyback:355] Out: 450 [Urine:450] Total I/O In: 0  Out: 400 [Urine:400]   Physical Exam: Alert Tracking with the eyes Ocular gyrate movements Opening the mouth and taking pured diet Chest clear bilaterally Abdomen soft with suprapubic tenderness Lower extremities with contractures no edema  Lab Results: Basic Metabolic Panel:  Basename 03/20/11 0516 03/19/11 0615  NA 135 133*  K 3.6 3.8  CL 104 102  CO2 19 18*  GLUCOSE 88 105*  BUN 6 6  CREATININE 0.83 0.81  CALCIUM 9.2 9.2  MG -- --  PHOS -- --   Liver Function Tests: No results found for this basename: AST:2,ALT:2,ALKPHOS:2,BILITOT:2,PROT:2,ALBUMIN:2 in the last 72 hours No results found for this basename: LIPASE:2,AMYLASE:2 in the last 72 hours No results found for this basename: AMMONIA:2 in the last 72 hours CBC:  Basename 03/20/11 0516 03/19/11 0615  WBC 8.0 13.4*  NEUTROABS -- --  HGB 11.6* 11.9*  HCT 34.4* 34.0*    MCV 84.3 83.1  PLT 413* 392    Recent Results (from the past 240 hour(s))  URINE CULTURE     Status: Normal   Collection Time   03/12/11  2:23 PM      Component Value Range Status Comment   Specimen Description URINE, RANDOM   Final    Special Requests ADDED [id number]   Final    Culture  Setup Time [id number]   Final    Colony Count NO GROWTH   Final    Culture NO GROWTH   Final    Report Status 03/13/2011 FINAL   Final   URINE CULTURE     Status: Normal   Collection Time   03/18/11  8:26 AM      Component Value Range Status Comment   Specimen Description URINE, RANDOM   Final    Special Requests NONE   Final    Culture  Setup Time [id number]   Final    Colony Count >=100,000 COLONIES/ML   Final    Culture ENTEROCOCCUS SPECIES   Final    Report Status 03/20/2011 FINAL   Final    Organism ID, Bacteria ENTEROCOCCUS SPECIES   Final   CULTURE, BLOOD (ROUTINE X 2)  Status: Normal (Preliminary result)   Collection Time   03/18/11 11:20 AM      Component Value Range Status Comment   Specimen Description BLOOD RIGHT ARM   Final    Special Requests BOTTLES DRAWN AEROBIC AND ANAEROBIC 10CC   Final    Culture  Setup Time [id number]   Final    Culture     Final    Value:        BLOOD CULTURE RECEIVED NO GROWTH TO DATE CULTURE WILL BE HELD FOR 5 DAYS BEFORE ISSUING A FINAL NEGATIVE REPORT   Report Status PENDING   Incomplete   CULTURE, BLOOD (ROUTINE X 2)     Status: Normal (Preliminary result)   Collection Time   03/18/11 11:25 AM      Component Value Range Status Comment   Specimen Description BLOOD RIGHT HAND   Final    Special Requests BOTTLES DRAWN AEROBIC AND ANAEROBIC 10CC   Final    Culture  Setup Time [id number]   Final    Culture     Final    Value:        BLOOD CULTURE RECEIVED NO GROWTH TO DATE CULTURE WILL BE HELD FOR 5 DAYS BEFORE ISSUING A FINAL NEGATIVE REPORT   Report Status PENDING   Incomplete     Studies/Results: No results found. MRI brain with  multiple white matter lesions - none of them enhancing Medications: Scheduled Meds:    . ampicillin-sulbactam (UNASYN) IV  3 g Intravenous Q6H  . interferon beta-1b  0.3 mg Subcutaneous QODAY  . methocarbamol(ROBAXIN) IV  500 mg Intravenous Q8H  . piperacillin-tazobactam  3.375 g Intravenous NOW  . sodium chloride  1,000 mL Intravenous Once  . DISCONTD: ceFEPime (MAXIPIME) IV  1 g Intravenous Q12H  . DISCONTD: levofloxacin (LEVAQUIN) IV  500 mg Intravenous Q24H  . DISCONTD: piperacillin-tazobactam (ZOSYN)  IV  3.375 g Intravenous Q8H  . DISCONTD: vancomycin  1,000 mg Intravenous Q12H   Continuous Infusions:    . dextrose 5 % and 0.9% NaCl    . DISCONTD: sodium chloride 100 mL/hr at 03/20/11 0956  . DISCONTD: dextrose 5 % and 0.45 % NaCl with KCl 40 mEq/L 50 mL/hr at 03/19/11 0023   PRN Meds:.acetaminophen, acetaminophen, alum & mag hydroxide-simeth, diphenhydrAMINE, food thickener, ibuprofen, ondansetron (ZOFRAN) IV, ondansetron Urine culture 03/01/11 enterococus Urine culture 03/12/11 no growth  Urine culture 3/10/13Enterococcus sensitive to ampicillin Blood culture 03/18/11 NGTD  Assessment/Plan:  1 encephalopathy - Initially patient was admitted for supposedly altered mental status. 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 The patient can communicate a little, is bed bound, has contractures, has extreme difficulty with swallowing, cannot chew. When he becomes febrile and there is an infectious complication obviously the encephalopathy is getting worse.   Enterococcus UTI - the patient was admitted on March 12, 2011. Initially we suspected he may have a urinary tract infection but there are surprise the initial urine culture from March 12, 2011 did not indicate any growth. On March 17, 2011 we noticed that he was having low-grade fever. By March 18, 2011 temperature was up to 101 and the patient  was markedly worse. Blood cultures urine cultures and chest x-ray were done on that date. The patient was started on empiric vancomycin Levaquin and cefepime. He continued to remain febrile. On March 19, 2011 the urine culture results were available indicated enterococcus. On  that date we have switched antibiotics to vancomycin and Zosyn. Final urine culture results were available on March 20, 2011 and the patient is currently on Unasyn. Obviously the presence of bladder stones is making this infection extremity difficult to eradicate.    #3 hematuria  CT showing bladder stones Urology consulted, recommended I and O cath but the patient's wife says she will be unable to do this. .  Patient was extremely uncomfortable with In and Out cath- moaning and complaining so we placed an indwelling Foley catheter for now.   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 Anne Hahn, patient's primary neurologist  - he recommended peg tube The patient  was on Tysabri for 3 years and there is concern that he may have PML - but MRI does not indicate such  MRI resulted on 03/15/11 -  Shows no active MS lesions - will continue betaseron and not give pulse dose iv steroids.   #5 Dysphagia and malnutrition - for now will start D1 diet with honey thick liquids  Will place peg tube after fever resolved for consistent long-term nutrition in this patient with end-stage multiple sclerosis and dysphagia  Full code per family wishes after palliative care met with family   #5 prophylaxis  SCDs for DVT prophylaxis.     LOS: 8 days   Takeysha Bonk [facility] Pager [phone] 03/20/2011, 1:27 PM

## 2011-03-20 NOTE — Progress Notes (Signed)
     [facility] Daily Rounding Note 03/20/2011, 8:30 AM  SUBJECTIVE:       Fevers improved.  Last acetominophen at 1423 yesterday.  WBCs improved.   OBJECTIVE:        General: Looks better  Vital signs in last 24 hours:    Temp:  [98.2 F (36.8 C)-103.3 F (39.6 C)] 98.4 F (36.9 C) (03/12 0435) Pulse Rate:  [98-121] 98  (03/12 0435) Resp:  [18-19] 18  (03/12 0435) BP: (104-119)/(71-80) 119/80 mmHg (03/12 0435) SpO2:  [90 %-100 %] 100 % (03/12 0435) Weight:  [191 lb 12.8 oz (87 kg)] 191 lb 12.8 oz (87 kg) (03/12 0435) Last BM Date: 03/14/11  Heart: RRR Chest: Clear in front.  No labored breathing. Abdomen: soft, ND, NT.  BS normoactive.   Extremities: no pedal edema.  Neuro/Psych:  Awake, alert, attempts speech but unable to vocalize, no spasms, follows commands:  Overall better.   Intake/Output from previous day: 03/11 0701 - 03/12 0700 In: 591.7 [P.O.:120; I.V.:116.7; IV Piggyback:355] Out: 450 [Urine:450]  Intake/Output this shift:    Lab Results:  Basename 03/20/11 0516 03/19/11 0615 03/18/11 0551  WBC 8.0 13.4* 16.1*  HGB 11.6* 11.9* 12.6*  HCT 34.4* 34.0* 36.2*  PLT 413* 392 380   BMET  Basename 03/20/11 0516 03/19/11 0615 03/18/11 0551  NA 135 133* 132*  K 3.6 3.8 4.1  CL 104 102 99  CO2 19 18* 20  GLUCOSE 88 105* 107*  BUN 6 6 7   CREATININE 0.83 0.81 0.89  CALCIUM 9.2 9.2 9.4   Studies/Results: Dg Chest Port 1 View  03/18/2011  *RADIOLOGY REPORT*  Clinical Data: Aspiration.  PORTABLE CHEST - 1 VIEW  Comparison: Chest x-ray 03/13/2011.  Findings: Lung volumes are low.  No consolidative airspace disease. No pleural effusions.  Pulmonary vasculature and the cardiomediastinal silhouette are within normal limits.  IMPRESSION:  1.  No radiographic evidence of acute cardiopulmonary disease.  Low lung volumes.  Original Report Authenticated By: Florencia Reasons, M.D.    ASSESMENT: 1. 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  2. Recurrent  UTIs. Now with hematuria and bladder stones. Urine culture is pending.  Fever improved. On Zosyn, Vanco.  Growing enterococcal species in urine. 3. Normocytic anemia    PLAN: 1.  PEG.  Timing per Dr Rhea Belton.  Continue puree diet for now with IVF.   LOS: 8 days   Jennye Moccasin  03/20/2011, 8:30 AM Pager: [phone]

## 2011-03-20 NOTE — Progress Notes (Signed)
ANTIBIOTIC CONSULT NOTE - FOLLOW UP  Pharmacy Consult for Zosyn Indication: Empiric - recurrent fevers  No Known Allergies  Patient Measurements: Height: 6\' 4"  (193 cm) Weight: 191 lb 12.8 oz (87 kg) IBW/kg (Calculated) : 86.8  Adjusted Body Weight:   Vital Signs: Temp: 98.4 F (36.9 C) (03/12 0435) Temp src: Oral (03/12 0435) BP: 119/80 mmHg (03/12 0435) Pulse Rate: 98  (03/12 0435) Intake/Output from previous day: 03/11 0701 - 03/12 0700 In: 591.7 [P.O.:120; I.V.:116.7; IV Piggyback:355] Out: 450 [Urine:450] Intake/Output from this shift:    Labs:  Basename 03/20/11 0516 03/19/11 0615 03/18/11 0551  WBC 8.0 13.4* 16.1*  HGB 11.6* 11.9* 12.6*  PLT 413* 392 380  LABCREA -- -- --  CREATININE 0.83 0.81 0.89   Estimated Creatinine Clearance: 149.6 ml/min (by C-G formula based on Cr of 0.83). No results found for this basename: VANCOTROUGH:2,VANCOPEAK:2,VANCORANDOM:2,GENTTROUGH:2,GENTPEAK:2,GENTRANDOM:2,TOBRATROUGH:2,TOBRAPEAK:2,TOBRARND:2,AMIKACINPEAK:2,AMIKACINTROU:2,AMIKACIN:2, in the last 72 hours   Microbiology: Recent Results (from the past 720 hour(s))  URINE CULTURE     Status: Normal   Collection Time   03/01/11  3:13 PM      Component Value Range Status Comment   Specimen Description URINE, CATHETERIZED   Final    Special Requests none Normal   Final    Culture  Setup Time [id number]   Final    Colony Count >=100,000 COLONIES/ML   Final    Culture ENTEROCOCCUS SPECIES   Final    Report Status 03/03/2011 FINAL   Final    Organism ID, Bacteria ENTEROCOCCUS SPECIES   Final   URINE CULTURE     Status: Normal   Collection Time   03/12/11  2:23 PM      Component Value Range Status Comment   Specimen Description URINE, RANDOM   Final    Special Requests ADDED [id number]   Final    Culture  Setup Time [id number]   Final    Colony Count NO GROWTH   Final    Culture NO GROWTH   Final    Report Status 03/13/2011 FINAL   Final   URINE CULTURE     Status:  Normal   Collection Time   03/18/11  8:26 AM      Component Value Range Status Comment   Specimen Description URINE, RANDOM   Final    Special Requests NONE   Final    Culture  Setup Time [id number]   Final    Colony Count >=100,000 COLONIES/ML   Final    Culture ENTEROCOCCUS SPECIES   Final    Report Status 03/20/2011 FINAL   Final    Organism ID, Bacteria ENTEROCOCCUS SPECIES   Final   CULTURE, BLOOD (ROUTINE X 2)     Status: Normal (Preliminary result)   Collection Time   03/18/11 11:20 AM      Component Value Range Status Comment   Specimen Description BLOOD RIGHT ARM   Final    Special Requests BOTTLES DRAWN AEROBIC AND ANAEROBIC 10CC   Final    Culture  Setup Time [id number]   Final    Culture     Final    Value:        BLOOD CULTURE RECEIVED NO GROWTH TO DATE CULTURE WILL BE HELD FOR 5 DAYS BEFORE ISSUING A FINAL NEGATIVE REPORT   Report Status PENDING   Incomplete   CULTURE, BLOOD (ROUTINE X 2)     Status: Normal (Preliminary result)   Collection Time   03/18/11 11:25 AM  Component Value Range Status Comment   Specimen Description BLOOD RIGHT HAND   Final    Special Requests BOTTLES DRAWN AEROBIC AND ANAEROBIC 10CC   Final    Culture  Setup Time [id number]   Final    Culture     Final    Value:        BLOOD CULTURE RECEIVED NO GROWTH TO DATE CULTURE WILL BE HELD FOR 5 DAYS BEFORE ISSUING A FINAL NEGATIVE REPORT   Report Status PENDING   Incomplete     Anti-infectives     Start     Dose/Rate Route Frequency Ordered Stop   03/20/11 1200   Ampicillin-Sulbactam (UNASYN) 3 g in sodium chloride 0.9 % 100 mL IVPB        3 g 100 mL/hr over 60 Minutes Intravenous Every 6 hours 03/20/11 1013     03/20/11 0000   piperacillin-tazobactam (ZOSYN) IVPB 3.375 g  Status:  Discontinued        3.375 g 12.5 mL/hr over 240 Minutes Intravenous Every 8 hours 03/19/11 1802 03/20/11 1013   03/19/11 1815   piperacillin-tazobactam (ZOSYN) IVPB 3.375 g        3.375 g 100 mL/hr over  30 Minutes Intravenous NOW 03/19/11 1802 03/19/11 1923   03/18/11 1100   ceFEPIme (MAXIPIME) 1 g in dextrose 5 % 50 mL IVPB  Status:  Discontinued        1 g 100 mL/hr over 30 Minutes Intravenous Every 12 hours 03/18/11 1009 03/19/11 1624   03/18/11 0900   vancomycin (VANCOCIN) IVPB 1000 mg/200 mL premix  Status:  Discontinued        1,000 mg 200 mL/hr over 60 Minutes Intravenous Every 12 hours 03/18/11 0838 03/20/11 1013   03/17/11 1700   levofloxacin (LEVAQUIN) IVPB 500 mg  Status:  Discontinued        500 mg 100 mL/hr over 60 Minutes Intravenous Every 24 hours 03/17/11 1625 03/19/11 1624          Assessment: Urine culture came back with enterococcus that is sens to ampicillin. D/w Dr. Lavera Guise, change vanc/zosyn to unasyn.  Goal of Therapy:  Clinical improvement  Plan:  1. Unasyn 3gm IV q6 2. Rx to sign off

## 2011-03-20 NOTE — Progress Notes (Signed)
Utilization review complete 

## 2011-03-21 ENCOUNTER — Encounter (HOSPITAL_COMMUNITY): Payer: Self-pay

## 2011-03-21 ENCOUNTER — Encounter (HOSPITAL_COMMUNITY)
Admission: EM | Disposition: A | Discharge status: Home Health | Payer: Self-pay | Source: Home / Self Care | Attending: Internal Medicine

## 2011-03-21 LAB — CBC
Hemoglobin: 10.8 g/dL — ABNORMAL LOW (ref 13.0–17.0)
MCHC: 34.4 g/dL (ref 30.0–36.0)
Platelets: 389 10*3/uL (ref 150–400)
RBC: 3.78 MIL/uL — ABNORMAL LOW (ref 4.22–5.81)

## 2011-03-21 LAB — BASIC METABOLIC PANEL
GFR calc non Af Amer: >90 mL/min (ref >90)
Glucose, Bld: 105 mg/dL — ABNORMAL HIGH (ref 70–99)
Potassium: 3.1 mEq/L — ABNORMAL LOW (ref 3.5–5.1)
Sodium: 138 mEq/L (ref 135–145)

## 2011-03-21 SURGERY — INSERTION, PEG TUBE
Anesthesia: Moderate Sedation

## 2011-03-21 MED ORDER — BUTAMBEN-TETRACAINE-BENZOCAINE 2-2-14 % EX AERO
INHALATION_SPRAY | CUTANEOUS | Status: DC | PRN
  Administered 2011-03-21: 1 via TOPICAL

## 2011-03-21 MED ORDER — POTASSIUM CHLORIDE 2 MEQ/ML IV SOLN
INTRAVENOUS | Status: AC
  Administered 2011-03-21 – 2011-03-22 (×3): via INTRAVENOUS
  Filled 2011-03-21 (×5): qty 1000

## 2011-03-21 MED ORDER — FENTANYL NICU IV SYRINGE 50 MCG/ML
INJECTION | INTRAMUSCULAR | Status: DC | PRN
  Administered 2011-03-21: 12.5 ug via INTRAVENOUS
  Administered 2011-03-21 (×3): 25 ug via INTRAVENOUS

## 2011-03-21 MED ORDER — LIDOCAINE HCL 2 % IJ SOLN
INTRAMUSCULAR | Status: AC
  Filled 2011-03-21: qty 1

## 2011-03-21 MED ORDER — INTERFERON BETA-1B 0.3 MG ~~LOC~~ SOLR
0.3000 mg | SUBCUTANEOUS | Status: DC
  Administered 2011-03-21 – 2011-03-25 (×3): 0.3 mg via SUBCUTANEOUS
  Filled 2011-03-21: qty 0.3

## 2011-03-21 MED ORDER — MIDAZOLAM HCL 10 MG/2ML IJ SOLN
INTRAMUSCULAR | Status: DC | PRN
  Administered 2011-03-21 (×2): 1 mg via INTRAVENOUS
  Administered 2011-03-21 (×2): 2 mg via INTRAVENOUS

## 2011-03-21 MED ORDER — MIDAZOLAM HCL 10 MG/2ML IJ SOLN
INTRAMUSCULAR | Status: AC
  Filled 2011-03-21: qty 2

## 2011-03-21 MED ORDER — FENTANYL CITRATE 0.05 MG/ML IJ SOLN
INTRAMUSCULAR | Status: AC
  Filled 2011-03-21: qty 2

## 2011-03-21 NOTE — Progress Notes (Signed)
Patent seen and examined, long discussion with wife and mother at bedside, patient has very advanced MS, now with worsening dysphagia with plans to place a peg tube later today, family today was very hesitant in going ahead with peg tube, however after re- discussion,they want to proceed.Have spoken with IR to see if they can place it today. Family interested in exploring home hospice options as well. Agree with plan as outlined by Ms Elyn Peers.

## 2011-03-21 NOTE — Progress Notes (Signed)
03/21/2011 Palliative Medicine Team SW 1:42 PM  PMT RN/Hospice Liaison Valente David received request from RN/CM to provide education on hospice services to family. PMT RN and PMT SW met with pt's wife and mother and had extensive discussion about general services and philosophy of hospice at home as well as residential hospice options. Informed family that pt would need to be evaluated for eligibility for home/residential hospice services.    Family struggling to make choices regarding a comfort vs aggressive treatment approach for pt given the daily variability of his condition. Family report they are beginning to note a cycle of repeated infections/hospitalizations related to his medical decline.   Family report current consensus that attempting the feeding tube placement would be an effort to support pt's quality of life at this time. Wife and mother disagree on code status, but mother deferring to wife, who requests pt continue to be full code at this time.   Family expressed some concerns about pt d/c to home based on increased care needs and fears related to a home death and request further information about residential hospice, stating this would be their preference. Requested BP Liaison Forrestine Him for educational visit follow up. Family strongly state SNF placement not an option. Family report desire to continue to gather information, are hopeful for PEG placement this afternoon, and plan to contact PMT to communicate their decisions on residential vs hospice at home vs home health. PMT will continue to follow and support.   Kennieth Francois, [place] Pager [phone]

## 2011-03-21 NOTE — Interval H&P Note (Signed)
History and Physical Interval Note:  Received request for gastric tube placement under fluoroscopy.  History reviewed and patient examined. Seen earlier by palliative care.  No family currently present to talk with- they have apparently been talked with by others in regards to IR gastric tube placement.  RN informs me they wish to proceed. Orders placed to do so.   03/21/2011 1:49 PM    Terry Palmer D

## 2011-03-21 NOTE — Progress Notes (Signed)
Spouse and mother have returned to unit.  Discussed with them in detail procedure for percutaneous gastric tube placement.  Potential complications including but not limited to infection, bleeding, complications with moderate sedation and anatomical variance affecting placement were discussed and all their questions answered.  Spouse signed consent.  Orders written to start barium this pm for planned placement tomorrow.

## 2011-03-21 NOTE — Progress Notes (Signed)
Agree 

## 2011-03-21 NOTE — H&P (View-Only) (Signed)
Patient ID: Duncan E Rabago, male   DOB: 10/29/1975, 36 y.o.   MRN: [id number]  Consults:   1.  Palliative Care (full code) 2.  Urology  Procedures:   Subjective: Patient Asleep.  Family very concerned about: 1.  Recurrently UTIs necessitating frequent hospitalizations since Sept. 2.  Bladder Stones. 3.  Family feels strongly if they could get him past recurrent UTIs they could attempt stronger therapy for M/S.   Objective: Vital signs in last 24 hours: Temp:  [98 F (36.7 C)-99.5 F (37.5 C)] 98 F (36.7 C) (03/13 0504) Pulse Rate:  [89-98] 89  (03/13 0504) Resp:  [17-18] 17  (03/13 0504) BP: (112-116)/(75-76) 116/76 mmHg (03/13 0504) SpO2:  [97 %-100 %] 99 % (03/13 0504) Weight:  [82.5 kg (181 lb 14.1 oz)] 82.5 kg (181 lb 14.1 oz) (03/13 0504) Weight change: -4.5 kg (-9 lb 14.7 oz) Last BM Date: 03/14/11  Patient Vitals for the past 24 hrs:  BP Temp Temp src Pulse Resp SpO2 Weight  03/21/11 0504 116/76 mmHg 98 F (36.7 C) Oral 89  17  99 % 82.5 kg (181 lb 14.1 oz)  03/20/11 2220 114/75 mmHg 98.8 F (37.1 C) Axillary 98  18  97 % -  03/20/11 1400 112/76 mmHg 99.5 F (37.5 C) Oral 92  18  100 % -     Intake/Output from previous day: 03/12 0701 - 03/13 0700 In: 1390 [P.O.:240; I.V.:800; IV Piggyback:350] Out: 1400 [Urine:1400]    Physical Exam: Sleeping soundly after sedation in Endoscopy lab. Chest clear bilaterally Abdomen soft with suprapubic tenderness, foley in place. Lower extremities with contractures no edema  Lab Results: Basic Metabolic Panel:  Basename 03/21/11 0500 03/20/11 0516  NA 138 135  K 3.1* 3.6  CL 105 104  CO2 21 19  GLUCOSE 105* 88  BUN <3* 6  CREATININE 0.64 0.83  CALCIUM 8.7 9.2  MG -- --  PHOS -- --   CBC:  Basename 03/21/11 0500 03/20/11 0516  WBC 6.5 8.0  NEUTROABS -- --  HGB 10.8* 11.6*  HCT 31.4* 34.4*  MCV 83.1 84.3  PLT 389 413*    Recent Results (from the past 240 hour(s))  URINE CULTURE     Status:  Normal   Collection Time   03/12/11  2:23 PM      Component Value Range Status Comment   Specimen Description URINE, RANDOM   Final    Special Requests ADDED [id number]   Final    Culture  Setup Time [id number]   Final    Colony Count NO GROWTH   Final    Culture NO GROWTH   Final    Report Status 03/13/2011 FINAL   Final   URINE CULTURE     Status: Normal   Collection Time   03/18/11  8:26 AM      Component Value Range Status Comment   Specimen Description URINE, RANDOM   Final    Special Requests NONE   Final    Culture  Setup Time [id number]   Final    Colony Count >=100,000 COLONIES/ML   Final    Culture ENTEROCOCCUS SPECIES   Final    Report Status 03/20/2011 FINAL   Final    Organism ID, Bacteria ENTEROCOCCUS SPECIES   Final   CULTURE, BLOOD (ROUTINE X 2)     Status: Normal (Preliminary result)   Collection Time   03/18/11 11:20 AM      Component Value Range Status Comment     Specimen Description BLOOD RIGHT ARM   Final    Special Requests BOTTLES DRAWN AEROBIC AND ANAEROBIC 10CC   Final    Culture  Setup Time [id number]   Final    Culture     Final    Value:        BLOOD CULTURE RECEIVED NO GROWTH TO DATE CULTURE WILL BE HELD FOR 5 DAYS BEFORE ISSUING A FINAL NEGATIVE REPORT   Report Status PENDING   Incomplete   CULTURE, BLOOD (ROUTINE X 2)     Status: Normal (Preliminary result)   Collection Time   03/18/11 11:25 AM      Component Value Range Status Comment   Specimen Description BLOOD RIGHT HAND   Final    Special Requests BOTTLES DRAWN AEROBIC AND ANAEROBIC 10CC   Final    Culture  Setup Time [id number]   Final    Culture     Final    Value:        BLOOD CULTURE RECEIVED NO GROWTH TO DATE CULTURE WILL BE HELD FOR 5 DAYS BEFORE ISSUING A FINAL NEGATIVE REPORT   Report Status PENDING   Incomplete     Studies/Results: No results found. MRI brain with multiple white matter lesions - none of them enhancing Medications: Scheduled Meds:    .  ampicillin-sulbactam (UNASYN) IV  3 g Intravenous Q6H  . interferon beta-1b  0.3 mg Subcutaneous QODAY  . methocarbamol(ROBAXIN) IV  500 mg Intravenous Q8H  . DISCONTD: piperacillin-tazobactam (ZOSYN)  IV  3.375 g Intravenous Q8H  . DISCONTD: vancomycin  1,000 mg Intravenous Q12H   Continuous Infusions:    . dextrose 5 % and 0.9% NaCl 1,000 mL (03/20/11 1632)  . DISCONTD: sodium chloride 100 mL/hr at 03/20/11 0956   PRN Meds:.acetaminophen, acetaminophen, alum & mag hydroxide-simeth, diphenhydrAMINE, food thickener, ibuprofen, ondansetron (ZOFRAN) IV, ondansetron Urine culture 03/01/11 enterococus Urine culture 03/12/11 no growth  Urine culture 3/10/13Enterococcus sensitive to ampicillin Blood culture 03/18/11 NGTD  Assessment/Plan:  encephalopathy - Initially patient was admitted for supposedly altered mental status. 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 The patient can communicate a little, is bed bound, has contractures, has extreme difficulty with swallowing, cannot chew. When he becomes febrile and there is an infectious complication obviously the encephalopathy is getting worse.   Enterococcus UTI - the patient was admitted on March 12, 2011. Initially we suspected he may have a urinary tract infection but the initial urine culture from March 12, 2011 did not indicate any growth. On March 17, 2011 we noticed that he was having low-grade fever. By March 18, 2011 temperature was up to 103 (R) and the patient was markedly worse. Blood cultures urine cultures and chest x-ray were done on that date. The patient was started on empiric vancomycin Levaquin and cefepime. He continued to remain febrile. On March 19, 2011 the urine culture results were available indicated enterococcus. On that date we have switched antibiotics to vancomycin and Zosyn. Final urine culture results were available on March 20, 2011  and the patient is currently on Unasyn. Obviously the presence of bladder stones is making this infection extremity difficult to eradicate.   Have requested Urology re-consultation on 3/13 for recurrent UTIs   hematuria  CT showing bladder stones Urology consulted, recommended I and O cath but the patient's wife says she will be unable to do this. .  Patient was extremely   uncomfortable with In and Out cath- moaning and complaining so we placed an indwelling Foley catheter for now.   Dr. Dahlstadt consulted on 3/5 and made the following recommendations:    1.  In and Out Cath q 8 hours.  (Patient unable to tolerate this). 2.  Eliminate constipation and fecal impaction. 3.   I do not think he is a candidate for stone management i.e. operative/cystoscopic extraction. (Family would like to explore stone management further). 4.  If a foley is left in place start him on dilute acetic acid irrigation (0.025%) once or twice a day to decrease bacterial colonization. - ( Please leave specific instructions to nursing about how this should be done).   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  Discussed with Dr. Willis, patient's primary neurologist  - he recommended peg tube The patient  was on Tysabri for 3 years and there is concern that he may have PML - but MRI does not indicate such.  MRI resulted on 03/15/11 -  Shows no active MS lesions - will continue betaseron and not give pulse dose iv steroids.   #5 Dysphagia and malnutrition - for now will start D1 diet with honey thick liquids Gastroenterology attempted PEG placement, but determined it would be better to be done by IR. IR to see patient today and hopefully place peg today.  Hypokalemia  Replete in IV Fluids.  Change to elixir after PEG placed.  Full code per family wishes after palliative care met with family    prophylaxis  SCDs for  DVT prophylaxis.     LOS: 9 days   York, Ezra Denne L [facility] Pager [phone] 03/21/2011, 8:05 AM  

## 2011-03-21 NOTE — Progress Notes (Signed)
Patient discussed at the Long Length of Stay Terry Palmer 03/21/2011  

## 2011-03-21 NOTE — Progress Notes (Signed)
I spoke with the patient's wife, Terry Palmer on the phone this evening. She was concerned about the bladder stones. Currently, he has a Foley catheter and is adequately draining his bladder. I do not recommend treating his bladder stones, as that would require a significant procedure under anesthesia, and this man cannot tolerate that at the present time.I would recommend leaving the catheter in, and changing it every 4-6 weeks. If the stones/infection becomes symptomatic,irrigating the bladder twice a day through the catheter with a very weak acetic acid solution (0.025%) would be the best way to manage that, probably keeping the rate of infections down.

## 2011-03-21 NOTE — Progress Notes (Signed)
Nutrition Follow-up/ Consult  Diet Order:  Dysphagia 1 with honey thick PEG placement was attempted on 3/13, unsuccessful.  Plan to try again 3/14 with IR. RD consult for recommendations for TF.   Meds: Scheduled Meds:   . ampicillin-sulbactam (UNASYN) IV  3 g Intravenous Q6H  . interferon beta-1b  0.3 mg Subcutaneous QODAY  . methocarbamol(ROBAXIN) IV  500 mg Intravenous Q8H   Continuous Infusions:   . dextrose 5 % 1,000 mL with potassium chloride 40 mEq infusion 100 mL/hr at 03/21/11 1339  . DISCONTD: dextrose 5 % and 0.9% NaCl 1,000 mL (03/20/11 1632)   PRN Meds:.acetaminophen, acetaminophen, alum & mag hydroxide-simeth, diphenhydrAMINE, food thickener, ibuprofen, ondansetron (ZOFRAN) IV, ondansetron, DISCONTD: butamben-tetracaine-benzocaine, DISCONTD: fentaNYL, DISCONTD: midazolam  Labs:  CMP     Component Value Date/Time   NA 138 03/21/2011 0500   K 3.1* 03/21/2011 0500   CL 105 03/21/2011 0500   CO2 21 03/21/2011 0500   GLUCOSE 105* 03/21/2011 0500   BUN <3* 03/21/2011 0500   CREATININE 0.64 03/21/2011 0500   CALCIUM 8.7 03/21/2011 0500   PROT 6.5 03/12/2011 2054   ALBUMIN 3.1* 03/12/2011 2054   AST 23 03/12/2011 2054   ALT 18 03/12/2011 2054   ALKPHOS 98 03/12/2011 2054   BILITOT 0.4 03/12/2011 2054   GFRNONAA >90 03/21/2011 0500   GFRAA >90 03/21/2011 0500     Intake/Output Summary (Last 24 hours) at 03/21/11 1447 Last data filed at 03/21/11 0429  Gross per 24 hour  Intake   1040 ml  Output   1000 ml  Net     40 ml    Weight Status:  181 lbs, 10 lbs weight increased in one day. ? Accuracy of weight.   Re-estimated needs:  1900-2100 kcal, 85-100 gm protein  Nutrition Dx:  Inadequate oral intake, continues  Goal:  >90% of estimated nutrition needs will be met via EN once PEG placed, unmet  Intervention:   If continuous feeding is desired, recommend: Jevity 1.2 at a goal rate of 70 ml/hr to provide 2016 kcal, 93 gm protein, and 1355 ml free water. Plus additional 150 ml free  water bolus q 6 hours for hydration needs.   If bolus feeding is desired, recommend: Jevity 1.2 cal, 7 can daily. 2 cans at each 0800, 1200, 1600, and 1 can at 2000. This provides 1995 kcal, 92.4 gm protein and 1337 ml free water. Plus additional 150 ml free water flush q 6 hours for hydration.   Monitor:  PEG placement, TF tolerance, weight , labs   Terry Palmer MARIE Pager #:  [phone]

## 2011-03-21 NOTE — Op Note (Signed)
[facility] [address] [place], [place]  [address]  ENDOSCOPY PROCEDURE REPORT  PATIENT:  Terry Palmer, Terry Palmer  MR#:  [id number] BIRTHDATE:  14-Mar-1975, 35 yrs. old  GENDER:  male ENDOSCOPIST:  Carie Caddy. Aurel Nguyen, MD Referred by:  [facility] PROCEDURE DATE:  03/21/2011 PROCEDURE:  EGD, diagnostic 43235 ASA CLASS:  Class III INDICATIONS:  dysphagia, placement of PEG MEDICATIONS:   Fentanyl 87.5 mcg IV, Versed 6 mg IV TOPICAL ANESTHETIC:  Cetacaine Spray  DESCRIPTION OF PROCEDURE:   After the risks benefits and alternatives of the procedure were thoroughly explained, informed consent was obtained.  The Pentax Gastroscope [id number] endoscope was introduced through the mouth and advanced to the second portion of the duodenum, without limitations.  The instrument was slowly withdrawn as the mucosa was fully examined. <<PROCEDUREIMAGES>>  The upper, middle, and distal third of the esophagus were carefully inspected and no abnormalities were noted. The z-line was well seen at the GEJ. The endoscope was pushed into the fundus which was normal including a retroflexed view. The antrum,gastric body, first and second part of the duodenum were unremarkable. Retroflexed views revealed no abnormalities.   PEG placement was not performed due to the inability to transilluminate and less than ideal one to one palpation after insufflation of the stomach. The scope was then withdrawn from the patient and the procedure completed.  COMPLICATIONS:  None  ENDOSCOPIC IMPRESSION: 1) Normal EGD 2) PEG placement not performed due to inability to transilluminate.  RECOMMENDATIONS: 1) IR consult for PEG placement.  Carie Caddy. Rhea Belton, MD  n. Rosalie DoctorCarie Caddy. Isaack Preble at 03/21/2011 10:13 AM  Lennie Odor, [phone]

## 2011-03-21 NOTE — Progress Notes (Signed)
Patient ID: Terry Palmer, male   DOB: 1975-12-12, 36 y.o.   MRN: [id number]  Consults:   1.  Palliative Care (full code) 2.  Urology  Procedures:   Subjective: Patient Asleep.  Family very concerned about: 1.  Recurrently UTIs necessitating frequent hospitalizations since Sept. 2.  Bladder Stones. 3.  Family feels strongly if they could get him past recurrent UTIs they could attempt stronger therapy for M/S.   Objective: Vital signs in last 24 hours: Temp:  [98 F (36.7 C)-99.5 F (37.5 C)] 98 F (36.7 C) (03/13 0504) Pulse Rate:  [89-98] 89  (03/13 0504) Resp:  [17-18] 17  (03/13 0504) BP: (112-116)/(75-76) 116/76 mmHg (03/13 0504) SpO2:  [97 %-100 %] 99 % (03/13 0504) Weight:  [82.5 kg (181 lb 14.1 oz)] 82.5 kg (181 lb 14.1 oz) (03/13 0504) Weight change: -4.5 kg (-9 lb 14.7 oz) Last BM Date: 03/14/11  Patient Vitals for the past 24 hrs:  BP Temp Temp src Pulse Resp SpO2 Weight  03/21/11 0504 116/76 mmHg 98 F (36.7 C) Oral 89  17  99 % 82.5 kg (181 lb 14.1 oz)  03/20/11 2220 114/75 mmHg 98.8 F (37.1 C) Axillary 98  18  97 % -  03/20/11 1400 112/76 mmHg 99.5 F (37.5 C) Oral 92  18  100 % -     Intake/Output from previous day: 03/12 0701 - 03/13 0700 In: 1390 [P.O.:240; I.V.:800; IV Piggyback:350] Out: 1400 [Urine:1400]    Physical Exam: Sleeping soundly after sedation in Endoscopy lab. Chest clear bilaterally Abdomen soft with suprapubic tenderness, foley in place. Lower extremities with contractures no edema  Lab Results: Basic Metabolic Panel:  Basename 03/21/11 0500 03/20/11 0516  NA 138 135  K 3.1* 3.6  CL 105 104  CO2 21 19  GLUCOSE 105* 88  BUN <3* 6  CREATININE 0.64 0.83  CALCIUM 8.7 9.2  MG -- --  PHOS -- --   CBC:  Basename 03/21/11 0500 03/20/11 0516  WBC 6.5 8.0  NEUTROABS -- --  HGB 10.8* 11.6*  HCT 31.4* 34.4*  MCV 83.1 84.3  PLT 389 413*    Recent Results (from the past 240 hour(s))  URINE CULTURE     Status:  Normal   Collection Time   03/12/11  2:23 PM      Component Value Range Status Comment   Specimen Description URINE, RANDOM   Final    Special Requests ADDED [id number]   Final    Culture  Setup Time [id number]   Final    Colony Count NO GROWTH   Final    Culture NO GROWTH   Final    Report Status 03/13/2011 FINAL   Final   URINE CULTURE     Status: Normal   Collection Time   03/18/11  8:26 AM      Component Value Range Status Comment   Specimen Description URINE, RANDOM   Final    Special Requests NONE   Final    Culture  Setup Time [id number]   Final    Colony Count >=100,000 COLONIES/ML   Final    Culture ENTEROCOCCUS SPECIES   Final    Report Status 03/20/2011 FINAL   Final    Organism ID, Bacteria ENTEROCOCCUS SPECIES   Final   CULTURE, BLOOD (ROUTINE X 2)     Status: Normal (Preliminary result)   Collection Time   03/18/11 11:20 AM      Component Value Range Status Comment  Specimen Description BLOOD RIGHT ARM   Final    Special Requests BOTTLES DRAWN AEROBIC AND ANAEROBIC 10CC   Final    Culture  Setup Time [id number]   Final    Culture     Final    Value:        BLOOD CULTURE RECEIVED NO GROWTH TO DATE CULTURE WILL BE HELD FOR 5 DAYS BEFORE ISSUING A FINAL NEGATIVE REPORT   Report Status PENDING   Incomplete   CULTURE, BLOOD (ROUTINE X 2)     Status: Normal (Preliminary result)   Collection Time   03/18/11 11:25 AM      Component Value Range Status Comment   Specimen Description BLOOD RIGHT HAND   Final    Special Requests BOTTLES DRAWN AEROBIC AND ANAEROBIC 10CC   Final    Culture  Setup Time [id number]   Final    Culture     Final    Value:        BLOOD CULTURE RECEIVED NO GROWTH TO DATE CULTURE WILL BE HELD FOR 5 DAYS BEFORE ISSUING A FINAL NEGATIVE REPORT   Report Status PENDING   Incomplete     Studies/Results: No results found. MRI brain with multiple white matter lesions - none of them enhancing Medications: Scheduled Meds:    .  ampicillin-sulbactam (UNASYN) IV  3 g Intravenous Q6H  . interferon beta-1b  0.3 mg Subcutaneous QODAY  . methocarbamol(ROBAXIN) IV  500 mg Intravenous Q8H  . DISCONTD: piperacillin-tazobactam (ZOSYN)  IV  3.375 g Intravenous Q8H  . DISCONTD: vancomycin  1,000 mg Intravenous Q12H   Continuous Infusions:    . dextrose 5 % and 0.9% NaCl 1,000 mL (03/20/11 1632)  . DISCONTD: sodium chloride 100 mL/hr at 03/20/11 0956   PRN Meds:.acetaminophen, acetaminophen, alum & mag hydroxide-simeth, diphenhydrAMINE, food thickener, ibuprofen, ondansetron (ZOFRAN) IV, ondansetron Urine culture 03/01/11 enterococus Urine culture 03/12/11 no growth  Urine culture 3/10/13Enterococcus sensitive to ampicillin Blood culture 03/18/11 NGTD  Assessment/Plan:  encephalopathy - Initially patient was admitted for supposedly altered mental status. 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 The patient can communicate a little, is bed bound, has contractures, has extreme difficulty with swallowing, cannot chew. When he becomes febrile and there is an infectious complication obviously the encephalopathy is getting worse.   Enterococcus UTI - the patient was admitted on March 12, 2011. Initially we suspected he may have a urinary tract infection but the initial urine culture from March 12, 2011 did not indicate any growth. On March 17, 2011 we noticed that he was having low-grade fever. By March 18, 2011 temperature was up to 103 (R) and the patient was markedly worse. Blood cultures urine cultures and chest x-ray were done on that date. The patient was started on empiric vancomycin Levaquin and cefepime. He continued to remain febrile. On March 19, 2011 the urine culture results were available indicated enterococcus. On that date we have switched antibiotics to vancomycin and Zosyn. Final urine culture results were available on March 20, 2011  and the patient is currently on Unasyn. Obviously the presence of bladder stones is making this infection extremity difficult to eradicate.   Have requested Urology re-consultation on 3/13 for recurrent UTIs   hematuria  CT showing bladder stones Urology consulted, recommended I and O cath but the patient's wife says she will be unable to do this. .  Patient was extremely  uncomfortable with In and Out cath- moaning and complaining so we placed an indwelling Foley catheter for now.   Dr. Hillis Range consulted on 3/5 and made the following recommendations:    1.  In and Out Cath q 8 hours.  (Patient unable to tolerate this). 2.  Eliminate constipation and fecal impaction. 3.   I do not think he is a candidate for stone management i.e. operative/cystoscopic extraction. (Family would like to explore stone management further). 4.  If a foley is left in place start him on dilute acetic acid irrigation (0.025%) once or twice a day to decrease bacterial colonization. - ( Please leave specific instructions to nursing about how this should be done).   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  Discussed with Dr. Anne Hahn, patient's primary neurologist  - he recommended peg tube The patient  was on Tysabri for 3 years and there is concern that he may have PML - but MRI does not indicate such.  MRI resulted on 03/15/11 -  Shows no active MS lesions - will continue betaseron and not give pulse dose iv steroids.   #5 Dysphagia and malnutrition - for now will start D1 diet with honey thick liquids Gastroenterology attempted PEG placement, but determined it would be better to be done by IR. IR to see patient today and hopefully place peg today.  Hypokalemia  Replete in IV Fluids.  Change to elixir after PEG placed.  Full code per family wishes after palliative care met with family    prophylaxis  SCDs for  DVT prophylaxis.     LOS: 9 days   Stephani Police [facility] Pager [phone] 03/21/2011, 8:05 AM

## 2011-03-21 NOTE — Interval H&P Note (Signed)
History and Physical Interval Note:  03/21/2011 9:32 AM  Terry Palmer  has presented today for surgery, with the diagnosis of dysphagia, difficulty with feeding along with known hx of MS.  The various methods of treatment have been discussed with the patient and family. After consideration of risks, benefits and other options for treatment, the patient has consented to  Procedure(s) (LRB): PERCUTANEOUS ENDOSCOPIC GASTROSTOMY (PEG) PLACEMENT (N/A) as a surgical intervention .  [name] history has been reviewed, patient examined, no change in status, stable for surgery.  I have reviewed the patients' chart and labs.  Questions were answered to the patient's satisfaction.    No fevers for > 24 hours.  Already on abx, thus no additional pre-procedure medication necessary.   Caroljean Monsivais M

## 2011-03-22 ENCOUNTER — Inpatient Hospital Stay (HOSPITAL_COMMUNITY): Payer: Medicare HMO

## 2011-03-22 LAB — BASIC METABOLIC PANEL
BUN: <3 mg/dL — ABNORMAL LOW (ref 6–23)
Chloride: 102 mEq/L (ref 96–112)
GFR calc Af Amer: >90 mL/min (ref >90)
GFR calc non Af Amer: >90 mL/min (ref >90)
Potassium: 3.3 mEq/L — ABNORMAL LOW (ref 3.5–5.1)
Sodium: 133 mEq/L — ABNORMAL LOW (ref 135–145)

## 2011-03-22 MED ORDER — FENTANYL CITRATE 0.05 MG/ML IJ SOLN
INTRAMUSCULAR | Status: AC
  Filled 2011-03-22: qty 4

## 2011-03-22 MED ORDER — MIDAZOLAM HCL 2 MG/2ML IJ SOLN
INTRAMUSCULAR | Status: AC
  Filled 2011-03-22: qty 4

## 2011-03-22 MED ORDER — MORPHINE SULFATE 2 MG/ML IJ SOLN
1.0000 mg | INTRAMUSCULAR | Status: DC | PRN
  Administered 2011-03-22: 1 mg via INTRAVENOUS
  Filled 2011-03-22: qty 1

## 2011-03-22 MED ORDER — CEFAZOLIN SODIUM 1-5 GM-% IV SOLN
INTRAVENOUS | Status: AC
  Filled 2011-03-22: qty 50

## 2011-03-22 NOTE — ED Notes (Signed)
Procedure postponed till am pending barium location.

## 2011-03-22 NOTE — Progress Notes (Signed)
Gave pt half of cup of Barium thickened to honey consistency per order. He started coughing so I did not resume to finish the cup. Pt resting comfortably 30 minutes later.

## 2011-03-22 NOTE — Progress Notes (Addendum)
Patient ZO:[id number] Terry Palmer      DOB: 1975/01/25      VWU:[id number]   Palliative Medicine Team at [facility] Progress Note    Cc: "Pt with c/o of L arm discomfort     Filed Vitals:   03/22/11 0459  BP: 112/72  Pulse: 94  Temp: 99 F (37.2 C)  Resp: 20   Objective: Patient alert this am to verbal stimuli, wife present at bedside.Scheduled in IR for PEG tube placement today. Pt.stated he was having pain in L arm. Physical exam:  Resp: CTA bilaterally, unlabored  CV: RRR,   NEURO: alert/arousable with  Verbal stimuli, responds appropriately with muffled voice to queries    Assessment and plan:     Pain: Ordered Morphine 1 mg IV q 4 hrs as needed  GOC: Asked wife to see if patients mother Terry Palmer) would agree to meet with PCT this afternoon around 2 pm to discuss disposition plans and future care goals. Spoke with wife re plans for disposition. She verbalized she would want to continue aggresive medical intervention for future problems that would arise, but has concerns about her ability to care for husband at home.Wife Terry Palmer  does not wish to have patient placed on hospice care, and would consider SNF placement with palliative care. Wife also informed Dr Sharl Ma that patients mother coming in today. Will plan meeting today with wife and mother to readdress goals of care.  Lowella Dell, ANP-C  Palliative Care Service  I have seen the patient and agree with above plan  Meredeth Ide [phone]

## 2011-03-22 NOTE — Progress Notes (Signed)
PATIENT DETAILS Name: Terry Palmer Age: 36 y.o. Sex: male Date of Birth: Jan 30, 1975 Admit Date: 03/12/2011 ZOX:[id number],UJWJX A, MD, MD  Subjective: More awake today  Objective: Vital signs in last 24 hours: Filed Vitals:   03/21/11 1300 03/21/11 2203 03/22/11 0459 03/22/11 0500  BP: 104/69 115/74 112/72   Pulse: 85 97 94   Temp: 98.1 F (36.7 C) 99.5 F (37.5 C) 99 F (37.2 C)   TempSrc: Oral Oral Rectal   Resp: 20 18 20    Height:      Weight:    82.555 kg (182 lb)  SpO2:  99% 98%     Weight change: 0.055 kg (1.9 oz)  Body mass index is 22.15 kg/(m^2).  Intake/Output from previous day:  Intake/Output Summary (Last 24 hours) at 03/22/11 1136 Last data filed at 03/22/11 0600  Gross per 24 hour  Intake   2200 ml  Output   1851 ml  Net    349 ml    PHYSICAL EXAM: Gen Exam: Awake and alert  Neck: Supple, No JVD.   Chest: B/L Clear.   CVS: S1 S2 Regular, no murmurs.  Abdomen: soft, BS +, non tender, non distended.  Extremities: no edema, lower extremities warm to touch.   CONSULTS: Palliative Care Urology  LAB RESULTS: CBC  Lab 03/21/11 0500 03/20/11 0516 03/19/11 0615 03/18/11 0551 03/17/11 0615  WBC 6.5 8.0 13.4* 16.1* 12.5*  HGB 10.8* 11.6* 11.9* 12.6* 12.2*  HCT 31.4* 34.4* 34.0* 36.2* 35.7*  PLT 389 413* 392 380 338  MCV 83.1 84.3 83.1 83.8 84.6  MCH 28.6 28.4 29.1 29.2 28.9  MCHC 34.4 33.7 35.0 34.8 34.2  RDW 13.7 13.7 13.4 13.5 13.3  LYMPHSABS -- -- -- -- --  MONOABS -- -- -- -- --  EOSABS -- -- -- -- --  BASOSABS -- -- -- -- --  BANDABS -- -- -- -- --    Chemistries   Lab 03/22/11 0510 03/21/11 0500 03/20/11 0516 03/19/11 0615 03/18/11 0551  NA 133* 138 135 133* 132*  K 3.3* 3.1* 3.6 3.8 4.1  CL 102 105 104 102 99  CO2 21 21 19  18* 20  GLUCOSE 105* 105* 88 105* 107*  BUN <3* <3* 6 6 7   CREATININE 0.65 0.64 0.83 0.81 0.89  CALCIUM 8.8 8.7 9.2 9.2 9.4  MG -- -- -- -- --    GFR Estimated Creatinine Clearance: 147.7 ml/min (by  C-G formula based on Cr of 0.65).  Coagulation profile No results found for this basename: INR:5,PROTIME:5 in the last 168 hours  Cardiac Enzymes No results found for this basename: CK:3,CKMB:3,TROPONINI:3,MYOGLOBIN:3 in the last 168 hours  No components found with this basename: POCBNP:3 No results found for this basename: DDIMER:2 in the last 72 hours No results found for this basename: HGBA1C:2 in the last 72 hours No results found for this basename: CHOL:2,HDL:2,LDLCALC:2,TRIG:2,CHOLHDL:2,LDLDIRECT:2 in the last 72 hours No results found for this basename: TSH,T4TOTAL,FREET3,T3FREE,THYROIDAB in the last 72 hours No results found for this basename: VITAMINB12:2,FOLATE:2,FERRITIN:2,TIBC:2,IRON:2,RETICCTPCT:2 in the last 72 hours No results found for this basename: LIPASE:2,AMYLASE:2 in the last 72 hours  Urine Studies No results found for this basename: UACOL:2,UAPR:2,USPG:2,UPH:2,UTP:2,UGL:2,UKET:2,UBIL:2,UHGB:2,UNIT:2,UROB:2,ULEU:2,UEPI:2,UWBC:2,URBC:2,UBAC:2,CAST:2,CRYS:2,UCOM:2,BILUA:2 in the last 72 hours  MICROBIOLOGY: Recent Results (from the past 240 hour(s))  URINE CULTURE     Status: Normal   Collection Time   03/12/11  2:23 PM      Component Value Range Status Comment   Specimen Description URINE, RANDOM   Final    Special  Requests ADDED [id number]   Final    Culture  Setup Time [id number]   Final    Colony Count NO GROWTH   Final    Culture NO GROWTH   Final    Report Status 03/13/2011 FINAL   Final   URINE CULTURE     Status: Normal   Collection Time   03/18/11  8:26 AM      Component Value Range Status Comment   Specimen Description URINE, RANDOM   Final    Special Requests NONE   Final    Culture  Setup Time [id number]   Final    Colony Count >=100,000 COLONIES/ML   Final    Culture ENTEROCOCCUS SPECIES   Final    Report Status 03/20/2011 FINAL   Final    Organism ID, Bacteria ENTEROCOCCUS SPECIES   Final   CULTURE, BLOOD (ROUTINE X 2)     Status: Normal  (Preliminary result)   Collection Time   03/18/11 11:20 AM      Component Value Range Status Comment   Specimen Description BLOOD RIGHT ARM   Final    Special Requests BOTTLES DRAWN AEROBIC AND ANAEROBIC 10CC   Final    Culture  Setup Time [id number]   Final    Culture     Final    Value:        BLOOD CULTURE RECEIVED NO GROWTH TO DATE CULTURE WILL BE HELD FOR 5 DAYS BEFORE ISSUING A FINAL NEGATIVE REPORT   Report Status PENDING   Incomplete   CULTURE, BLOOD (ROUTINE X 2)     Status: Normal (Preliminary result)   Collection Time   03/18/11 11:25 AM      Component Value Range Status Comment   Specimen Description BLOOD RIGHT HAND   Final    Special Requests BOTTLES DRAWN AEROBIC AND ANAEROBIC 10CC   Final    Culture  Setup Time [id number]   Final    Culture     Final    Value:        BLOOD CULTURE RECEIVED NO GROWTH TO DATE CULTURE WILL BE HELD FOR 5 DAYS BEFORE ISSUING A FINAL NEGATIVE REPORT   Report Status PENDING   Incomplete     RADIOLOGY STUDIES/RESULTS: Ct Abdomen Pelvis Wo Contrast  03/12/2011  *RADIOLOGY REPORT*  Clinical Data: Microscopic hematuria.  CT ABDOMEN AND PELVIS WITHOUT CONTRAST  Technique:  Multidetector CT imaging of the abdomen and pelvis was performed following the standard protocol without intravenous contrast.  Comparison: None.  Findings: Asymmetric elevation of the left hemidiaphragm noted.  No focal abnormalities seen in the liver or spleen on this study performed without intravenous contrast material.  The stomach, the duodenum, pancreas, gallbladder, and adrenal glands are unremarkable.  No stones are seen in either kidney.  There is no hydronephrosis. No secondary changes in either kidney.  No abdominal aortic aneurysm.  No free fluid or lymphadenopathy in the abdomen.  The abdominal bowel loops are unremarkable.  Imaging through the pelvis shows a prominent stool volume in the rectal vault.  No intraperitoneal free fluid.  Multiple stones are seen in the  bladder lumen, measuring up to 1.5 cm in diameter.  No pelvic sidewall lymphadenopathy.  There is some abnormal edema in the presacral soft tissues of indeterminate etiology.  The terminal ileum and the appendix are normal in appearance.  Bone windows reveal no worrisome lytic or sclerotic osseous lesions.  IMPRESSION: No renal or ureteral calculi.  No secondary changes in  either kidney.  Multiple bladder stones are evident.  Original Report Authenticated By: ERIC A. MANSELL, M.D.   Ct Head Wo Contrast  03/12/2011  *RADIOLOGY REPORT*  Clinical Data: Mental status changes.  Not eating.  Speech disturbance.  History multiple sclerosis.  CT HEAD WITHOUT CONTRAST  Technique:  Contiguous axial images were obtained from the base of the skull through the vertex without contrast.  Comparison: MRI 10/20/2010  Findings: the brain shows generalized atrophy.  There is abnormal low density throughout the hemispheric white matter consistent with the patient's known chronic demyelinating disease.  This does not appear to have grossly changed.  No sign of acute infarction, mass lesion, hemorrhage, hydrocephalus or extra-axial collection. Sinuses, middle ears and mastoids are clear.  No skull or skull base lesion.  IMPRESSION: Chronic white matter lesions in brain atrophy consistent with chronic multiple sclerosis.  No sign of any identifiable acute process.  Original Report Authenticated By: Thomasenia Sales, M.D.   Mr Laqueta Jean Wo Contrast  03/15/2011  *RADIOLOGY REPORT*  Clinical Data:  History of multiple sclerosis.  Decrease in mental status.  Please note present examination was performed over 2 days as the patient could not tolerate imaging one time.  MRI HEAD WITHOUT AND WITH CONTRAST MRI CERVICAL SPINE WITHOUT AND WITH CONTRAST  Technique:  Multiplanar, multiecho pulse sequences of the brain and surrounding structures, and cervical spine, to include the craniocervical junction and cervicothoracic junction, were obtained  without and with intravenous contrast.  Contrast: ,10mL MULTIHANCE GADOBENATE DIMEGLUMINE 529 MG/ML IV SOLN  Comparison:  10/20/2010.  MRI HEAD  Findings:  No acute infarct.  Mild progression of the severe demyelinating disease with change most apparent in the posterior-superior periventricular region and periatrial region.  None of these areas demonstrates enhancement or restricted motion as can be seen with active areas of demyelination.  No intracranial mass.  No intracranial hemorrhage.  Global atrophy without hydrocephalus.  Major intracranial vascular structures are patent.  Minimal paranasal sinus mucosal thickening.  Cervical spondylotic changes as discussed below.  IMPRESSION: Mild progression of the severe demyelinating disease with change most apparent in the posterior-superior periventricular region and periatrial region.  None of these areas demonstrates enhancement or restricted motion as can be seen with active areas of demyelination.  MRI CERVICAL SPINE  Findings: Diffuse altered signal intensity throughout the cervical and thoracic spine consistent with demyelinating process.  It is difficult to make direct comparison to the most recent prior exam as the prior exam is motion degraded.  Grossly there appears been no significant change.  None of these areas demonstrates enhancement to suggest active demyelination.  C2-3:  Bulge/shallow protrusion.  C3-4:  Shallow broad-based protrusion greater to the right with minimal cord flattening.  C4-5:  Minimal bulge.  Minimal Schmorl's node deformity.  C5-6:  Minimal to mild bulge.  C6-7:  Minimal Schmorl's node deformity.  Minimal bulge.  C7-T1:  Negative.  2 HU hemangioma unchanged.  IMPRESSION: Relatively similar appearance of demyelination involving the cervical and thoracic cord taking into account that the prior examination was motion degraded.  Similar appearance of disc disease most notable C3-4 level.  Please see above.  Original Report Authenticated  By: Fuller Canada, M.D.   Mr Cervical Spine W Wo Contrast  03/15/2011  *RADIOLOGY REPORT*  Clinical Data:  History of multiple sclerosis.  Decrease in mental status.  Please note present examination was performed over 2 days as the patient could not tolerate imaging one time.  MRI HEAD  WITHOUT AND WITH CONTRAST MRI CERVICAL SPINE WITHOUT AND WITH CONTRAST  Technique:  Multiplanar, multiecho pulse sequences of the brain and surrounding structures, and cervical spine, to include the craniocervical junction and cervicothoracic junction, were obtained without and with intravenous contrast.  Contrast: ,10mL MULTIHANCE GADOBENATE DIMEGLUMINE 529 MG/ML IV SOLN  Comparison:  10/20/2010.  MRI HEAD  Findings:  No acute infarct.  Mild progression of the severe demyelinating disease with change most apparent in the posterior-superior periventricular region and periatrial region.  None of these areas demonstrates enhancement or restricted motion as can be seen with active areas of demyelination.  No intracranial mass.  No intracranial hemorrhage.  Global atrophy without hydrocephalus.  Major intracranial vascular structures are patent.  Minimal paranasal sinus mucosal thickening.  Cervical spondylotic changes as discussed below.  IMPRESSION: Mild progression of the severe demyelinating disease with change most apparent in the posterior-superior periventricular region and periatrial region.  None of these areas demonstrates enhancement or restricted motion as can be seen with active areas of demyelination.  MRI CERVICAL SPINE  Findings: Diffuse altered signal intensity throughout the cervical and thoracic spine consistent with demyelinating process.  It is difficult to make direct comparison to the most recent prior exam as the prior exam is motion degraded.  Grossly there appears been no significant change.  None of these areas demonstrates enhancement to suggest active demyelination.  C2-3:  Bulge/shallow protrusion.  C3-4:   Shallow broad-based protrusion greater to the right with minimal cord flattening.  C4-5:  Minimal bulge.  Minimal Schmorl's node deformity.  C5-6:  Minimal to mild bulge.  C6-7:  Minimal Schmorl's node deformity.  Minimal bulge.  C7-T1:  Negative.  2 HU hemangioma unchanged.  IMPRESSION: Relatively similar appearance of demyelination involving the cervical and thoracic cord taking into account that the prior examination was motion degraded.  Similar appearance of disc disease most notable C3-4 level.  Please see above.  Original Report Authenticated By: Fuller Canada, M.D.   Dg Chest Port 1 View  03/18/2011  *RADIOLOGY REPORT*  Clinical Data: Aspiration.  PORTABLE CHEST - 1 VIEW  Comparison: Chest x-ray 03/13/2011.  Findings: Lung volumes are low.  No consolidative airspace disease. No pleural effusions.  Pulmonary vasculature and the cardiomediastinal silhouette are within normal limits.  IMPRESSION:  1.  No radiographic evidence of acute cardiopulmonary disease.  Low lung volumes.  Original Report Authenticated By: Florencia Reasons, M.D.   Dg Chest Port 1 View  03/13/2011  *RADIOLOGY REPORT*  Clinical Data: Pneumonia.  PORTABLE CHEST - 1 VIEW  Comparison: 03/12/2011.  Findings: The cardiac silhouette, mediastinal and hilar contours are within normal limits and stable.  Stable mild eventration of the left hemidiaphragm with mild overlying atelectasis.  Low lung volumes with mild vascular crowding but no infiltrates, edema or effusions.  IMPRESSION: No significant pulmonary findings.  Original Report Authenticated By: P. Loralie Champagne, M.D.   Dg Chest Portable 1 View  03/12/2011  *RADIOLOGY REPORT*  Clinical Data: Multiple sclerosis.  Failure to thrive.  PORTABLE CHEST - 1 VIEW  Comparison: 12/27/2010  Findings: 1715 hours.  Lung volumes are low. The lungs are clear without focal infiltrate, edema, pneumothorax or pleural effusion. The cardiopericardial silhouette is within normal limits for size.  Imaged bony structures of the thorax are intact. Telemetry leads overlie the chest.  IMPRESSION: Low volume film without acute findings.  Original Report Authenticated By: ERIC A. MANSELL, M.D.    MEDICATIONS: Scheduled Meds:   . ampicillin-sulbactam (UNASYN) IV  3 g Intravenous Q6H  . interferon beta-1b  0.3 mg Subcutaneous QODAY  . methocarbamol(ROBAXIN) IV  500 mg Intravenous Q8H  . DISCONTD: interferon beta-1b  0.3 mg Subcutaneous QODAY   Continuous Infusions:   . dextrose 5 % 1,000 mL with potassium chloride 40 mEq infusion 100 mL/hr at 03/22/11 0950  . DISCONTD: dextrose 5 % and 0.9% NaCl 1,000 mL (03/20/11 1632)   PRN Meds:.acetaminophen, acetaminophen, alum & mag hydroxide-simeth, diphenhydrAMINE, food thickener, ibuprofen, morphine injection, ondansetron (ZOFRAN) IV, ondansetron  Antibiotics: Anti-infectives     Start     Dose/Rate Route Frequency Ordered Stop   03/20/11 1200   Ampicillin-Sulbactam (UNASYN) 3 g in sodium chloride 0.9 % 100 mL IVPB        3 g 100 mL/hr over 60 Minutes Intravenous Every 6 hours 03/20/11 1013     03/20/11 0000   piperacillin-tazobactam (ZOSYN) IVPB 3.375 g  Status:  Discontinued        3.375 g 12.5 mL/hr over 240 Minutes Intravenous Every 8 hours 03/19/11 1802 03/20/11 1013   03/19/11 1815  piperacillin-tazobactam (ZOSYN) IVPB 3.375 g       3.375 g 100 mL/hr over 30 Minutes Intravenous NOW 03/19/11 1802 03/19/11 1923   03/18/11 1100   ceFEPIme (MAXIPIME) 1 g in dextrose 5 % 50 mL IVPB  Status:  Discontinued        1 g 100 mL/hr over 30 Minutes Intravenous Every 12 hours 03/18/11 1009 03/19/11 1624   03/18/11 0900   vancomycin (VANCOCIN) IVPB 1000 mg/200 mL premix  Status:  Discontinued        1,000 mg 200 mL/hr over 60 Minutes Intravenous Every 12 hours 03/18/11 0838 03/20/11 1013   03/17/11 1700   levofloxacin (LEVAQUIN) IVPB 500 mg  Status:  Discontinued        500 mg 100 mL/hr over 60 Minutes Intravenous Every 24 hours 03/17/11  1625 03/19/11 1624          Assessment/Plan: Patient Active Hospital Problem List: Enterococcus UTI -continue with Unasyn -will transition to augmentin on dischargte  Encephalopathy  -likely may have had worsening encephalopathy from baseline on admission-from UTI -current mental status now close to baseline  Hematuria/Bladder Stones -per urology not a operative/Procedure candidate  Neurogenic Bladder -foley in place -family hesitant to try straight cath at home  Hypokakemia -replete  Dysphagia -for Peg Tube placement by IR  Multiple sclerosis -advanced -continue with Betaseron -long discussion with patient   Quadriparesis (muscle weakness) -from advanced MS -bed bound, now needing peg tube  And foley cath placement  Disposition: Unclear-will need to talk with family to see if they are interested in SNF or home with home health services  DVT Prophylaxis: SCD's for now, after peg placed will re-evaluate after peg placed  Code Status:  Maretta Bees, fifth  MD. 03/22/2011, 11:36 AM

## 2011-03-22 NOTE — Progress Notes (Signed)
   CARE MANAGEMENT NOTE 03/22/2011  Patient:  Terry Palmer, Terry Palmer   Account Number:  [id number]  Date Initiated:  03/13/2011  Documentation initiated by:  Junius Creamer  Subjective/Objective Assessment:   adm w uti, hx mult sclerosis     Action/Plan:   lives w wife, pcp dr Fleet Contras, act w [facility]   Anticipated DC Date:  03/15/2011   Anticipated DC Plan:  HOME W HOME HEALTH SERVICES      DC Planning Services  CM consult      [facility] Choice  Resumption Of Svcs/PTA Provider   Choice offered to / List presented to:     DME arranged  AIR OVERLAY MATTRESS  HOSPITAL BED        HH arranged  HH-1 RN      [facility] agency  [facility]   Status of service:   Medicare Important Message given?   (If response is "NO", the following Medicare IM given date fields will be blank) Date Medicare IM given:   Date Additional Medicare IM given:    Discharge Disposition:  HOME W HOME HEALTH SERVICES  Per UR Regulation:    If discussed at Long Length of Stay Meetings, dates discussed:   03/21/2011    Comments:  03/21/11- 1100- Donn Pierini RN, BSN [phone] Will have Margie/PC team come talk with pt's wife/family regarding hospice services for information purposes. Also called [facility] and confirmed services that pt had PTA- pt had PT/OT/ST/RN and an aide that came 2x week. - gel overlay has been delivered to home-pt already has hospital bed. Pt for Peg tube placement. CM to continue to follow for d/c needs.  03/19/11- 1400- Donn Pierini RN, BSN [phone] Per MD family wound like to move forward with peg tube- GI to be consulted. Pt with fevers- unsure when peg tube to be placed. PC consulted and met with family over weekend. CM to continue to follow for d/c needs and planning.  03/15/11- 1245- Donn Pierini RN, BSN [phone] Received notice from Christoper Allegra that pt already has hospital bed, order for gel overlay given to [facility] with [facility]. Per pt's wife they are working with someone to get  pt a newer bed through an assistance program. CM to continue to follow for d/c needs. Will need HH-RN orders prior to discharge for I&O cath at home.  03/14/11 12:17 Letha Cape RN, BSN [phone] Patient is active with [facility] per NCM note, I called Corrie Dandy with [facility] to see what services patient had previously.  I tried to talke to patient , but patient was not awake yet.  Spoke with mother [name] she states they will need a hospital bed and air mattress, her phone number is  [phone].  Mother states wife name is [name] , her home phone is [phone] and cell is [phone].  I called [facility] with [facility] and faxed orders for hospital bed and air mattress, she states they will coordinate with wife for delivery tomorrow.  3-5 act w hhc. alerted mary w [facility] who will follow along until disch. debbie dowell rn,bsn [phone]

## 2011-03-22 NOTE — Progress Notes (Signed)
Patient ID: Terry Palmer, male   DOB: 09-24-1975, 36 y.o.   MRN: [id number]  Patient brought down for possible gastrostomy placement.  Barium ingested has reached transverse colon but not the distal transverse or splenic flexure.  Significant bowel gas in small bowel.  Attempt made to pass 5 Fr catheter through mouth into stomach to see how stomach would insufflate with air under fluoro.  Procedure aborted due to severe gag reflex and incomplete colonic opacification.  Patient will require moderate sedation just to allow orogastric catheter placement.  Plan: 1)  Gastrostomy tube placement cancelled for today. 2)  Will obtain abdominal xray in AM to check on barium transit and determine if safe to proceed with gastrostomy tomorrow.

## 2011-03-22 NOTE — Progress Notes (Signed)
Speech Language/Pathology Pt to have PEG placement today, SLP will defer treatment. Harlon Ditty, MA CCC-SLP [phone]

## 2011-03-22 NOTE — Procedures (Signed)
Fluoro utilized to check bowel gas and barium transit.  See report in Imaging/PACS.

## 2011-03-23 ENCOUNTER — Telehealth (HOSPITAL_COMMUNITY): Payer: Self-pay

## 2011-03-23 ENCOUNTER — Inpatient Hospital Stay (HOSPITAL_COMMUNITY): Payer: Medicare HMO

## 2011-03-23 ENCOUNTER — Encounter (HOSPITAL_COMMUNITY): Payer: Self-pay | Admitting: General Surgery

## 2011-03-23 DIAGNOSIS — R1310 Dysphagia, unspecified: Secondary | ICD-10-CM

## 2011-03-23 LAB — BASIC METABOLIC PANEL
CO2: 23 mEq/L (ref 19–32)
Chloride: 104 mEq/L (ref 96–112)
Creatinine, Ser: 0.64 mg/dL (ref 0.50–1.35)
GFR calc Af Amer: >90 mL/min (ref >90)
Potassium: 4.1 mEq/L (ref 3.5–5.1)
Sodium: 138 mEq/L (ref 135–145)

## 2011-03-23 MED ORDER — FENTANYL CITRATE 0.05 MG/ML IJ SOLN
INTRAMUSCULAR | Status: AC
  Filled 2011-03-23: qty 4

## 2011-03-23 MED ORDER — SODIUM CHLORIDE 0.9 % IV SOLN
1750.0000 mg | Freq: Once | INTRAVENOUS | Status: AC
  Administered 2011-03-23: 1750 mg via INTRAVENOUS
  Filled 2011-03-23: qty 1750

## 2011-03-23 MED ORDER — MIDAZOLAM HCL 2 MG/2ML IJ SOLN
INTRAMUSCULAR | Status: AC
  Filled 2011-03-23: qty 6

## 2011-03-23 MED ORDER — CEFAZOLIN SODIUM 1-5 GM-% IV SOLN
1.0000 g | Freq: Once | INTRAVENOUS | Status: AC
  Administered 2011-03-23: 1 g via INTRAVENOUS

## 2011-03-23 MED ORDER — POTASSIUM CHLORIDE 2 MEQ/ML IV SOLN
INTRAVENOUS | Status: DC
  Administered 2011-03-23 – 2011-03-25 (×5): via INTRAVENOUS
  Filled 2011-03-23 (×6): qty 1000

## 2011-03-23 MED ORDER — FENTANYL CITRATE 0.05 MG/ML IJ SOLN
INTRAMUSCULAR | Status: AC | PRN
  Administered 2011-03-23: 25 ug via INTRAVENOUS

## 2011-03-23 MED ORDER — VANCOMYCIN HCL IN DEXTROSE 1-5 GM/200ML-% IV SOLN
1000.0000 mg | Freq: Two times a day (BID) | INTRAVENOUS | Status: DC
  Administered 2011-03-24 – 2011-03-26 (×5): 1000 mg via INTRAVENOUS
  Filled 2011-03-23 (×6): qty 200

## 2011-03-23 MED ORDER — MIDAZOLAM HCL 5 MG/5ML IJ SOLN
INTRAMUSCULAR | Status: AC | PRN
  Administered 2011-03-23: 1 mg via INTRAVENOUS

## 2011-03-23 MED ORDER — VANCOMYCIN HCL IN DEXTROSE 1-5 GM/200ML-% IV SOLN
1000.0000 mg | Freq: Three times a day (TID) | INTRAVENOUS | Status: DC
  Filled 2011-03-23: qty 200

## 2011-03-23 NOTE — Progress Notes (Signed)
Patient temp 101.4  MD made aware Vanco protocol  Per Pharmacy order blood cx will attach cooling blanket, have to order  Rectal probe.

## 2011-03-23 NOTE — Progress Notes (Signed)
Utilization review completed.  

## 2011-03-23 NOTE — Consult Note (Signed)
Progress Note from the Palliative Medicine Team at [facility]  Subjective: Patient seen and examined, has been spiking temperature . Temp is 102.1. Mother at bedside, Peg tube could not be placed by IR. Surgery was consulted and at this time , patient declined peg tube . Mother supports the son's decision of not putting peg tube . Patient is on po dys 1 diet.     Objective: No Known Allergies Scheduled Meds:   . ampicillin-sulbactam (UNASYN) IV  3 g Intravenous Q6H  . ceFAZolin      .  ceFAZolin (ANCEF) IV  1 g Intravenous Once  . fentaNYL      . interferon beta-1b  0.3 mg Subcutaneous QODAY  . methocarbamol(ROBAXIN) IV  500 mg Intravenous Q8H  . midazolam       Continuous Infusions:   . dextrose 5 % with kcl 50 mL/hr at 03/23/11 1524   PRN Meds:.acetaminophen, acetaminophen, alum & mag hydroxide-simeth, diphenhydrAMINE, fentaNYL, food thickener, ibuprofen, midazolam, morphine injection, ondansetron (ZOFRAN) IV, ondansetron  BP 112/13  Pulse 77  Temp(Src) 102.1 F (38.9 C) (Axillary)  Resp 18  Ht 6\' 4"  (1.93 m)  Wt 83.961 kg (185 lb 1.6 oz)  BMI 22.53 kg/m2  SpO2 98%   PPS: 30%     Intake/Output Summary (Last 24 hours) at 03/23/11 1635 Last data filed at 03/23/11 1458  Gross per 24 hour  Intake   1640 ml  Output   3600 ml  Net  -1960 ml      LBM: 03/22/11    Stool Softner: Dulcolax  Physical Exam:  General: lethargic, does respond to verbal stimuli HEENT: Oral mucosa is pink and moist Chest: Clear Bilaterally CVS: S1S2 RRR Abdomen: Soft, nontender Ext: No c/c/e Neuro: Drowsy, responds to verbal stimuli  Labs: CBC    Component Value Date/Time   WBC 6.5 03/21/2011 0500   RBC 3.78* 03/21/2011 0500   HGB 10.8* 03/21/2011 0500   HCT 31.4* 03/21/2011 0500   PLT 389 03/21/2011 0500   MCV 83.1 03/21/2011 0500   MCH 28.6 03/21/2011 0500   MCHC 34.4 03/21/2011 0500   RDW 13.7 03/21/2011 0500   LYMPHSABS 1.0 03/13/2011 0500   MONOABS 0.4 03/13/2011 0500   EOSABS 0.0  03/13/2011 0500   BASOSABS 0.0 03/13/2011 0500    BMET    Component Value Date/Time   NA 138 03/23/2011 0500   K 4.1 03/23/2011 0500   CL 104 03/23/2011 0500   CO2 23 03/23/2011 0500   GLUCOSE 102* 03/23/2011 0500   BUN <3* 03/23/2011 0500   CREATININE 0.64 03/23/2011 0500   CALCIUM 9.2 03/23/2011 0500   GFRNONAA >90 03/23/2011 0500   GFRAA >90 03/23/2011 0500    CMP     Component Value Date/Time   NA 138 03/23/2011 0500   K 4.1 03/23/2011 0500   CL 104 03/23/2011 0500   CO2 23 03/23/2011 0500   GLUCOSE 102* 03/23/2011 0500   BUN <3* 03/23/2011 0500   CREATININE 0.64 03/23/2011 0500   CALCIUM 9.2 03/23/2011 0500   PROT 6.5 03/12/2011 2054   ALBUMIN 3.1* 03/12/2011 2054   AST 23 03/12/2011 2054   ALT 18 03/12/2011 2054   ALKPHOS 98 03/12/2011 2054   BILITOT 0.4 03/12/2011 2054   GFRNONAA >90 03/23/2011 0500   GFRAA >90 03/23/2011 0500        1     Assessment and Plan:  Multiple sclerosis Fever ? Aspiration pneumonia UTI  Consider CXR for possible pneumonia. Will  try to schedule regoals of care on 03/24/11.  Patient Documents Completed or Given: Document Given Completed  Advanced Directives Pkt    MOST    DNR    Gone from My Sight    Hard Choices      Time In Time Out Total Time Spent with Patient Total Overall Time         Greater than 50%  of this time was spent counseling and coordinating care related to the above assessment and plan.     1

## 2011-03-23 NOTE — Progress Notes (Signed)
   CARE MANAGEMENT NOTE 03/23/2011  Patient:  Terry Palmer, Terry Palmer   Account Number:  [id number]  Date Initiated:  03/13/2011  Documentation initiated by:  Junius Creamer  Subjective/Objective Assessment:   adm w uti, hx mult sclerosis     Action/Plan:   lives w wife, pcp dr Fleet Contras, act w [facility]   Anticipated DC Date:  03/26/2011   Anticipated DC Plan:  SKILLED NURSING FACILITY      DC Planning Services  CM consult      [facility] Choice  Resumption Of Svcs/PTA Provider   Choice offered to / List presented to:     DME arranged  AIR OVERLAY MATTRESS  HOSPITAL BED        HH arranged  HH-1 RN      [facility] agency  [facility]   Status of service:   Medicare Important Message given?   (If response is "NO", the following Medicare IM given date fields will be blank) Date Medicare IM given:   Date Additional Medicare IM given:    Discharge Disposition:  HOME W HOME HEALTH SERVICES  Per UR Regulation:    If discussed at Long Length of Stay Meetings, dates discussed:   03/21/2011    Comments:  03/23/11 15:46 Letha Cape RN, BSN [phone] per MD , wife is wanting snf now, CSW aware.  03/21/11- 1100- Donn Pierini RN, BSN [phone] Will have Margie/PC team come talk with pt's wife/family regarding hospice services for information purposes. Also called [facility] and confirmed services that pt had PTA- pt had PT/OT/ST/RN and an aide that came 2x week. - gel overlay has been delivered to home-pt already has hospital bed. CM to continue to follow for d/c needs.  03/19/11- 1400- Donn Pierini RN, BSN [phone] Per MD family wound like to move forward with peg tube- GI to be consulted. Pt with fevers- unsure when peg tube to be placed. PC consulted and met with family over weekend. CM to continue to follow for d/c needs and planning.  03/15/11- 1245- Donn Pierini RN, BSN [phone] Received notice from Christ[facility] Allegra that pt already has hospital bed, order for gel overlay given to [facility]  with [facility]. Per pt's wife they are working with someone to get pt a newer bed through an assistance program. CM to continue to follow for d/c needs. Will need HH-RN orders prior to discharge for I&O cath at home.  03/14/11 12:17 Letha Cape RN, BSN [phone] Patient is active with [facility] per NCM note, I called Corrie Dandy with [facility] to see what services patient had previously.  I tried to talke to patient , but patient was not awake yet.  Spoke with mother [name] she states they will need a hospital bed and air mattress, her phone number is  [phone].  Mother states wife name is [name] , her home phone is [phone] and cell is [phone].  I called [facility] with [facility] and faxed orders for hospital bed and air mattress, she states they will coordinate with wife for delivery tomorrow.  3-5 act w hhc. alerted mary w [facility] who will follow along until disch. debbie dowell rn,bsn [phone]

## 2011-03-23 NOTE — Progress Notes (Signed)
PATIENT DETAILS Name: Terry Palmer Age: 36 y.o. Sex: male Date of Birth: May 08, 1975 Admit Date: 03/12/2011 ZOX:[id number],UJWJX A, MD, MD  Subjective: More awake today-IR unable to place peg tube  Objective: Vital signs in last 24 hours: Filed Vitals:   03/23/11 0905 03/23/11 0910 03/23/11 0915 03/23/11 0920  BP: 112/76 109/69 106/72 112/76  Pulse: 93 93 93 94  Temp:      TempSrc:      Resp: 11 19 15 14   Height:      Weight:      SpO2: 98% 98% 100% 100%    Weight change:   Body mass index is 22.15 kg/(m^2).  Intake/Output from previous day:  Intake/Output Summary (Last 24 hours) at 03/23/11 1125 Last data filed at 03/23/11 0622  Gross per 24 hour  Intake   1555 ml  Output   3000 ml  Net  -1445 ml    PHYSICAL EXAM: Gen Exam: Awake and alert  Neck: Supple, No JVD.   Chest: B/L Clear.   CVS: S1 S2 Regular, no murmurs.  Abdomen: soft, BS +, non tender, non distended.  Extremities: no edema, lower extremities warm to touch.   CONSULTS: Palliative Care Urology  LAB RESULTS: CBC  Lab 03/21/11 0500 03/20/11 0516 03/19/11 0615 03/18/11 0551 03/17/11 0615  WBC 6.5 8.0 13.4* 16.1* 12.5*  HGB 10.8* 11.6* 11.9* 12.6* 12.2*  HCT 31.4* 34.4* 34.0* 36.2* 35.7*  PLT 389 413* 392 380 338  MCV 83.1 84.3 83.1 83.8 84.6  MCH 28.6 28.4 29.1 29.2 28.9  MCHC 34.4 33.7 35.0 34.8 34.2  RDW 13.7 13.7 13.4 13.5 13.3  LYMPHSABS -- -- -- -- --  MONOABS -- -- -- -- --  EOSABS -- -- -- -- --  BASOSABS -- -- -- -- --  BANDABS -- -- -- -- --    Chemistries   Lab 03/23/11 0500 03/22/11 0510 03/21/11 0500 03/20/11 0516 03/19/11 0615  NA 138 133* 138 135 133*  K 4.1 3.3* 3.1* 3.6 3.8  CL 104 102 105 104 102  CO2 23 21 21 19  18*  GLUCOSE 102* 105* 105* 88 105*  BUN <3* <3* <3* 6 6  CREATININE 0.64 0.65 0.64 0.83 0.81  CALCIUM 9.2 8.8 8.7 9.2 9.2  MG -- -- -- -- --    GFR Estimated Creatinine Clearance: 147.7 ml/min (by C-G formula based on Cr of 0.64).  Coagulation  profile No results found for this basename: INR:5,PROTIME:5 in the last 168 hours  Cardiac Enzymes No results found for this basename: CK:3,CKMB:3,TROPONINI:3,MYOGLOBIN:3 in the last 168 hours  No components found with this basename: POCBNP:3 No results found for this basename: DDIMER:2 in the last 72 hours No results found for this basename: HGBA1C:2 in the last 72 hours No results found for this basename: CHOL:2,HDL:2,LDLCALC:2,TRIG:2,CHOLHDL:2,LDLDIRECT:2 in the last 72 hours No results found for this basename: TSH,T4TOTAL,FREET3,T3FREE,THYROIDAB in the last 72 hours No results found for this basename: VITAMINB12:2,FOLATE:2,FERRITIN:2,TIBC:2,IRON:2,RETICCTPCT:2 in the last 72 hours No results found for this basename: LIPASE:2,AMYLASE:2 in the last 72 hours  Urine Studies No results found for this basename: UACOL:2,UAPR:2,USPG:2,UPH:2,UTP:2,UGL:2,UKET:2,UBIL:2,UHGB:2,UNIT:2,UROB:2,ULEU:2,UEPI:2,UWBC:2,URBC:2,UBAC:2,CAST:2,CRYS:2,UCOM:2,BILUA:2 in the last 72 hours  MICROBIOLOGY: Recent Results (from the past 240 hour(s))  URINE CULTURE     Status: Normal   Collection Time   03/18/11  8:26 AM      Component Value Range Status Comment   Specimen Description URINE, RANDOM   Final    Special Requests NONE   Final    Culture  Setup Time  [id number]   Final    Colony Count >=100,000 COLONIES/ML   Final    Culture ENTEROCOCCUS SPECIES   Final    Report Status 03/20/2011 FINAL   Final    Organism ID, Bacteria ENTEROCOCCUS SPECIES   Final   CULTURE, BLOOD (ROUTINE X 2)     Status: Normal (Preliminary result)   Collection Time   03/18/11 11:20 AM      Component Value Range Status Comment   Specimen Description BLOOD RIGHT ARM   Final    Special Requests BOTTLES DRAWN AEROBIC AND ANAEROBIC 10CC   Final    Culture  Setup Time [id number]   Final    Culture     Final    Value:        BLOOD CULTURE RECEIVED NO GROWTH TO DATE CULTURE WILL BE HELD FOR 5 DAYS BEFORE ISSUING A FINAL NEGATIVE  REPORT   Report Status PENDING   Incomplete   CULTURE, BLOOD (ROUTINE X 2)     Status: Normal (Preliminary result)   Collection Time   03/18/11 11:25 AM      Component Value Range Status Comment   Specimen Description BLOOD RIGHT HAND   Final    Special Requests BOTTLES DRAWN AEROBIC AND ANAEROBIC 10CC   Final    Culture  Setup Time [id number]   Final    Culture     Final    Value:        BLOOD CULTURE RECEIVED NO GROWTH TO DATE CULTURE WILL BE HELD FOR 5 DAYS BEFORE ISSUING A FINAL NEGATIVE REPORT   Report Status PENDING   Incomplete     RADIOLOGY STUDIES/RESULTS: Ct Abdomen Pelvis Wo Contrast  03/12/2011  *RADIOLOGY REPORT*  Clinical Data: Microscopic hematuria.  CT ABDOMEN AND PELVIS WITHOUT CONTRAST  Technique:  Multidetector CT imaging of the abdomen and pelvis was performed following the standard protocol without intravenous contrast.  Comparison: None.  Findings: Asymmetric elevation of the left hemidiaphragm noted.  No focal abnormalities seen in the liver or spleen on this study performed without intravenous contrast material.  The stomach, the duodenum, pancreas, gallbladder, and adrenal glands are unremarkable.  No stones are seen in either kidney.  There is no hydronephrosis. No secondary changes in either kidney.  No abdominal aortic aneurysm.  No free fluid or lymphadenopathy in the abdomen.  The abdominal bowel loops are unremarkable.  Imaging through the pelvis shows a prominent stool volume in the rectal vault.  No intraperitoneal free fluid.  Multiple stones are seen in the bladder lumen, measuring up to 1.5 cm in diameter.  No pelvic sidewall lymphadenopathy.  There is some abnormal edema in the presacral soft tissues of indeterminate etiology.  The terminal ileum and the appendix are normal in appearance.  Bone windows reveal no worrisome lytic or sclerotic osseous lesions.  IMPRESSION: No renal or ureteral calculi.  No secondary changes in either kidney.  Multiple bladder stones  are evident.  Original Report Authenticated By: ERIC A. MANSELL, M.D.   Ct Head Wo Contrast  03/12/2011  *RADIOLOGY REPORT*  Clinical Data: Mental status changes.  Not eating.  Speech disturbance.  History multiple sclerosis.  CT HEAD WITHOUT CONTRAST  Technique:  Contiguous axial images were obtained from the base of the skull through the vertex without contrast.  Comparison: MRI 10/20/2010  Findings: the brain shows generalized atrophy.  There is abnormal low density throughout the hemispheric white matter consistent with the patient's known chronic demyelinating disease.  This does not  appear to have grossly changed.  No sign of acute infarction, mass lesion, hemorrhage, hydrocephalus or extra-axial collection. Sinuses, middle ears and mastoids are clear.  No skull or skull base lesion.  IMPRESSION: Chronic white matter lesions in brain atrophy consistent with chronic multiple sclerosis.  No sign of any identifiable acute process.  Original Report Authenticated By: Thomasenia Sales, M.D.   Mr Laqueta Jean Wo Contrast  03/15/2011  *RADIOLOGY REPORT*  Clinical Data:  History of multiple sclerosis.  Decrease in mental status.  Please note present examination was performed over 2 days as the patient could not tolerate imaging one time.  MRI HEAD WITHOUT AND WITH CONTRAST MRI CERVICAL SPINE WITHOUT AND WITH CONTRAST  Technique:  Multiplanar, multiecho pulse sequences of the brain and surrounding structures, and cervical spine, to include the craniocervical junction and cervicothoracic junction, were obtained without and with intravenous contrast.  Contrast: ,10mL MULTIHANCE GADOBENATE DIMEGLUMINE 529 MG/ML IV SOLN  Comparison:  10/20/2010.  MRI HEAD  Findings:  No acute infarct.  Mild progression of the severe demyelinating disease with change most apparent in the posterior-superior periventricular region and periatrial region.  None of these areas demonstrates enhancement or restricted motion as can be seen with active  areas of demyelination.  No intracranial mass.  No intracranial hemorrhage.  Global atrophy without hydrocephalus.  Major intracranial vascular structures are patent.  Minimal paranasal sinus mucosal thickening.  Cervical spondylotic changes as discussed below.  IMPRESSION: Mild progression of the severe demyelinating disease with change most apparent in the posterior-superior periventricular region and periatrial region.  None of these areas demonstrates enhancement or restricted motion as can be seen with active areas of demyelination.  MRI CERVICAL SPINE  Findings: Diffuse altered signal intensity throughout the cervical and thoracic spine consistent with demyelinating process.  It is difficult to make direct comparison to the most recent prior exam as the prior exam is motion degraded.  Grossly there appears been no significant change.  None of these areas demonstrates enhancement to suggest active demyelination.  C2-3:  Bulge/shallow protrusion.  C3-4:  Shallow broad-based protrusion greater to the right with minimal cord flattening.  C4-5:  Minimal bulge.  Minimal Schmorl's node deformity.  C5-6:  Minimal to mild bulge.  C6-7:  Minimal Schmorl's node deformity.  Minimal bulge.  C7-T1:  Negative.  2 HU hemangioma unchanged.  IMPRESSION: Relatively similar appearance of demyelination involving the cervical and thoracic cord taking into account that the prior examination was motion degraded.  Similar appearance of disc disease most notable C3-4 level.  Please see above.  Original Report Authenticated By: Fuller Canada, M.D.   Mr Cervical Spine W Wo Contrast  03/15/2011  *RADIOLOGY REPORT*  Clinical Data:  History of multiple sclerosis.  Decrease in mental status.  Please note present examination was performed over 2 days as the patient could not tolerate imaging one time.  MRI HEAD WITHOUT AND WITH CONTRAST MRI CERVICAL SPINE WITHOUT AND WITH CONTRAST  Technique:  Multiplanar, multiecho pulse sequences of the  brain and surrounding structures, and cervical spine, to include the craniocervical junction and cervicothoracic junction, were obtained without and with intravenous contrast.  Contrast: ,10mL MULTIHANCE GADOBENATE DIMEGLUMINE 529 MG/ML IV SOLN  Comparison:  10/20/2010.  MRI HEAD  Findings:  No acute infarct.  Mild progression of the severe demyelinating disease with change most apparent in the posterior-superior periventricular region and periatrial region.  None of these areas demonstrates enhancement or restricted motion as can be seen with active areas  of demyelination.  No intracranial mass.  No intracranial hemorrhage.  Global atrophy without hydrocephalus.  Major intracranial vascular structures are patent.  Minimal paranasal sinus mucosal thickening.  Cervical spondylotic changes as discussed below.  IMPRESSION: Mild progression of the severe demyelinating disease with change most apparent in the posterior-superior periventricular region and periatrial region.  None of these areas demonstrates enhancement or restricted motion as can be seen with active areas of demyelination.  MRI CERVICAL SPINE  Findings: Diffuse altered signal intensity throughout the cervical and thoracic spine consistent with demyelinating process.  It is difficult to make direct comparison to the most recent prior exam as the prior exam is motion degraded.  Grossly there appears been no significant change.  None of these areas demonstrates enhancement to suggest active demyelination.  C2-3:  Bulge/shallow protrusion.  C3-4:  Shallow broad-based protrusion greater to the right with minimal cord flattening.  C4-5:  Minimal bulge.  Minimal Schmorl's node deformity.  C5-6:  Minimal to mild bulge.  C6-7:  Minimal Schmorl's node deformity.  Minimal bulge.  C7-T1:  Negative.  2 HU hemangioma unchanged.  IMPRESSION: Relatively similar appearance of demyelination involving the cervical and thoracic cord taking into account that the prior  examination was motion degraded.  Similar appearance of disc disease most notable C3-4 level.  Please see above.  Original Report Authenticated By: Fuller Canada, M.D.   Dg Chest Port 1 View  03/18/2011  *RADIOLOGY REPORT*  Clinical Data: Aspiration.  PORTABLE CHEST - 1 VIEW  Comparison: Chest x-ray 03/13/2011.  Findings: Lung volumes are low.  No consolidative airspace disease. No pleural effusions.  Pulmonary vasculature and the cardiomediastinal silhouette are within normal limits.  IMPRESSION:  1.  No radiographic evidence of acute cardiopulmonary disease.  Low lung volumes.  Original Report Authenticated By: Florencia Reasons, M.D.   Dg Chest Port 1 View  03/13/2011  *RADIOLOGY REPORT*  Clinical Data: Pneumonia.  PORTABLE CHEST - 1 VIEW  Comparison: 03/12/2011.  Findings: The cardiac silhouette, mediastinal and hilar contours are within normal limits and stable.  Stable mild eventration of the left hemidiaphragm with mild overlying atelectasis.  Low lung volumes with mild vascular crowding but no infiltrates, edema or effusions.  IMPRESSION: No significant pulmonary findings.  Original Report Authenticated By: P. Loralie Champagne, M.D.   Dg Chest Portable 1 View  03/12/2011  *RADIOLOGY REPORT*  Clinical Data: Multiple sclerosis.  Failure to thrive.  PORTABLE CHEST - 1 VIEW  Comparison: 12/27/2010  Findings: 1715 hours.  Lung volumes are low. The lungs are clear without focal infiltrate, edema, pneumothorax or pleural effusion. The cardiopericardial silhouette is within normal limits for size. Imaged bony structures of the thorax are intact. Telemetry leads overlie the chest.  IMPRESSION: Low volume film without acute findings.  Original Report Authenticated By: ERIC A. MANSELL, M.D.    MEDICATIONS: Scheduled Meds:    . ampicillin-sulbactam (UNASYN) IV  3 g Intravenous Q6H  . ceFAZolin      .  ceFAZolin (ANCEF) IV  1 g Intravenous Once  . fentaNYL      . interferon beta-1b  0.3 mg Subcutaneous  QODAY  . methocarbamol(ROBAXIN) IV  500 mg Intravenous Q8H  . midazolam       Continuous Infusions:    . dextrose 5 % 1,000 mL with potassium chloride 40 mEq infusion 100 mL/hr at 03/22/11 0950  . dextrose 5 % with kcl 100 mL/hr at 03/23/11 0104   PRN Meds:.acetaminophen, acetaminophen, alum & mag hydroxide-simeth, diphenhydrAMINE,  fentaNYL, food thickener, ibuprofen, midazolam, morphine injection, ondansetron (ZOFRAN) IV, ondansetron  Antibiotics: Anti-infectives     Start     Dose/Rate Route Frequency Ordered Stop   03/23/11 0900   ceFAZolin (ANCEF) IVPB 1 g/50 mL premix        1 g 100 mL/hr over 30 Minutes Intravenous  Once 03/23/11 0917 03/23/11 1610   03/22/11 1619   ceFAZolin (ANCEF) 1-5 GM-% IVPB     Comments: Delmer Islam: cabinet override         03/22/11 1619 03/23/11 0429   03/20/11 1200   Ampicillin-Sulbactam (UNASYN) 3 g in sodium chloride 0.9 % 100 mL IVPB        3 g 100 mL/hr over 60 Minutes Intravenous Every 6 hours 03/20/11 1013     03/20/11 0000   piperacillin-tazobactam (ZOSYN) IVPB 3.375 g  Status:  Discontinued        3.375 g 12.5 mL/hr over 240 Minutes Intravenous Every 8 hours 03/19/11 1802 03/20/11 1013   03/19/11 1815   piperacillin-tazobactam (ZOSYN) IVPB 3.375 g        3.375 g 100 mL/hr over 30 Minutes Intravenous NOW 03/19/11 1802 03/19/11 1923   03/18/11 1100   ceFEPIme (MAXIPIME) 1 g in dextrose 5 % 50 mL IVPB  Status:  Discontinued        1 g 100 mL/hr over 30 Minutes Intravenous Every 12 hours 03/18/11 1009 03/19/11 1624   03/18/11 0900   vancomycin (VANCOCIN) IVPB 1000 mg/200 mL premix  Status:  Discontinued        1,000 mg 200 mL/hr over 60 Minutes Intravenous Every 12 hours 03/18/11 0838 03/20/11 1013   03/17/11 1700   levofloxacin (LEVAQUIN) IVPB 500 mg  Status:  Discontinued        500 mg 100 mL/hr over 60 Minutes Intravenous Every 24 hours 03/17/11 1625 03/19/11 1624          Assessment/Plan: Patient Active Hospital Problem  List: Enterococcus UTI -continue with Unasyn -will transition to augmentin on dischargte  Encephalopathy  -likely may have had worsening encephalopathy from baseline on admission-from UTI -current mental status now close to baseline  Hematuria/Bladder Stones -per urology not a operative/Procedure candidate  Neurogenic Bladder -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 03/23/2011, 11:25 AM

## 2011-03-23 NOTE — Procedures (Signed)
Unable to puncture stomach safely under fluoro because of high position beneath the left costal margin. Therefore, can't place gastrostomy without risking small bowel injury or placement too close to the rib margin. Discussed with Dr. Rhea Belton.  rec surgical consultation for placement

## 2011-03-23 NOTE — Progress Notes (Signed)
Patient Temp elevated Tylenol given MD aware

## 2011-03-23 NOTE — Progress Notes (Signed)
Follow up- spoke with patient's mother Remus Blake outside of room, mother voiced conflicting emotions around decision making and patient's labile condition, offered emotional support and actively listened allowing mother to voice fears and frustrations, mother agreeable to talking again with PMT MD and contacted wife [name] via phone [phone]) who also is willing to be present- both [name] and patient's mother can meet together with PMT MD -meeting time confirmed for tomorrow  Saturday 03/24/11 @ 5:00 pm.  Valente David, RN 03/23/2011, 6:02 PM Palliative Medicine Team RN Liaison [phone]

## 2011-03-23 NOTE — Consult Note (Signed)
Family unsure at this point. Consult once decision made.

## 2011-03-23 NOTE — Consult Note (Signed)
I was asked to see this patient in regards to placement of an open gastrostomy tube.  He has a history of multiple sclerosis, which is advanced, along with bladder stones and recurrent UTIs.  He had been doing well at home until he kept getting recurrent UTIs 7-8 months ago.  When he felt bad his PO intake would drop off.  Otherwise, he tolerates a Dysphagia I diet without difficulty.  The family has been considering placement of a G-tube for nutritional assistance.  They are very much so on the fence about this decision.  I had a long d/w the wife and mother of the patient and explained that we would prefer not to place a g-tube if the patient is able to eat.  We also discussed that this would be an open placement which would require complete anesthesia, which has its own risks and complications, especially given the patient's current medical state.  After a long discussion, the mother and wife were leaning more away from placement of a g-tube.  I told them to discuss it and to also talk to the palliative care team about it as well.  I informed them that there is no need for an urgent decision.  I told them we were available if they changed their minds.  They understood and for now would like to wait.  I d/w the primary service.  Please call if family changes their mind.  Echo Propp E 4:04 PM 03-23-11

## 2011-03-23 NOTE — Progress Notes (Addendum)
ANTIBIOTIC CONSULT NOTE - INITIAL  Pharmacy Consult for Vancomycin Indication: Empiric for fever and concern for infection (source unknown)  No Known Allergies  Patient Measurements: Height: 6\' 4"  (193 cm) Weight: 185 lb 1.6 oz (83.961 kg) IBW/kg (Calculated) : 86.8   Vital Signs: Temp: 102.1 F (38.9 C) (03/15 1454) Temp src: Axillary (03/15 1454) BP: 112/13 mmHg (03/15 1357) Pulse Rate: 77  (03/15 1357) Intake/Output from previous day: 03/14 0701 - 03/15 0700 In: 1555 [I.V.:1300; IV Piggyback:255] Out: 3000 [Urine:3000] Intake/Output from this shift: Total I/O In: 240 [P.O.:240] Out: 600 [Urine:600]  Labs:  Basename 03/23/11 0500 03/22/11 0510 03/21/11 0500  WBC -- -- 6.5  HGB -- -- 10.8*  PLT -- -- 389  LABCREA -- -- --  CREATININE 0.64 0.65 0.64   Estimated Creatinine Clearance: 150.2 ml/min (by C-G formula based on Cr of 0.64). No results found for this basename: VANCOTROUGH:2,VANCOPEAK:2,VANCORANDOM:2,GENTTROUGH:2,GENTPEAK:2,GENTRANDOM:2,TOBRATROUGH:2,TOBRAPEAK:2,TOBRARND:2,AMIKACINPEAK:2,AMIKACINTROU:2,AMIKACIN:2, in the last 72 hours   Microbiology: Recent Results (from the past 720 hour(s))  URINE CULTURE     Status: Normal   Collection Time   03/01/11  3:13 PM      Component Value Range Status Comment   Specimen Description URINE, CATHETERIZED   Final    Special Requests none Normal   Final    Culture  Setup Time [id number]   Final    Colony Count >=100,000 COLONIES/ML   Final    Culture ENTEROCOCCUS SPECIES   Final    Report Status 03/03/2011 FINAL   Final    Organism ID, Bacteria ENTEROCOCCUS SPECIES   Final   URINE CULTURE     Status: Normal   Collection Time   03/12/11  2:23 PM      Component Value Range Status Comment   Specimen Description URINE, RANDOM   Final    Special Requests ADDED [id number]   Final    Culture  Setup Time [id number]   Final    Colony Count NO GROWTH   Final    Culture NO GROWTH   Final    Report Status 03/13/2011  FINAL   Final   URINE CULTURE     Status: Normal   Collection Time   03/18/11  8:26 AM      Component Value Range Status Comment   Specimen Description URINE, RANDOM   Final    Special Requests NONE   Final    Culture  Setup Time [id number]   Final    Colony Count >=100,000 COLONIES/ML   Final    Culture ENTEROCOCCUS SPECIES   Final    Report Status 03/20/2011 FINAL   Final    Organism ID, Bacteria ENTEROCOCCUS SPECIES   Final   CULTURE, BLOOD (ROUTINE X 2)     Status: Normal (Preliminary result)   Collection Time   03/18/11 11:20 AM      Component Value Range Status Comment   Specimen Description BLOOD RIGHT ARM   Final    Special Requests BOTTLES DRAWN AEROBIC AND ANAEROBIC 10CC   Final    Culture  Setup Time [id number]   Final    Culture     Final    Value:        BLOOD CULTURE RECEIVED NO GROWTH TO DATE CULTURE WILL BE HELD FOR 5 DAYS BEFORE ISSUING A FINAL NEGATIVE REPORT   Report Status PENDING   Incomplete   CULTURE, BLOOD (ROUTINE X 2)     Status: Normal (Preliminary result)   Collection Time   03/18/11 11:25  AM      Component Value Range Status Comment   Specimen Description BLOOD RIGHT HAND   Final    Special Requests BOTTLES DRAWN AEROBIC AND ANAEROBIC 10CC   Final    Culture  Setup Time [id number]   Final    Culture     Final    Value:        BLOOD CULTURE RECEIVED NO GROWTH TO DATE CULTURE WILL BE HELD FOR 5 DAYS BEFORE ISSUING A FINAL NEGATIVE REPORT   Report Status PENDING   Incomplete     Medical History: Past Medical History  Diagnosis Date  . MS (multiple sclerosis)   . UTI (urinary tract infection)   . 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 Childhood asthma   . Depression   . Neuromuscular disorder     Quadraperesis    Medications:  Anti-infectives     Start     Dose/Rate Route Frequency Ordered Stop   03/23/11 0900   ceFAZolin (ANCEF) IVPB 1 g/50 mL premix        1 g 100 mL/hr over 30 Minutes Intravenous  Once 03/23/11 0917 03/23/11 0937    03/22/11 1619   ceFAZolin (ANCEF) 1-5 GM-% IVPB     Comments: BARR, KAILEE: cabinet override         03/22/11 1619 03/23/11 0429   03/20/11 1200   Ampicillin-Sulbactam (UNASYN) 3 g in sodium chloride 0.9 % 100 mL IVPB        3 g 100 mL/hr over 60 Minutes Intravenous Every 6 hours 03/20/11 1013     03/20/11 0000   piperacillin-tazobactam (ZOSYN) IVPB 3.375 g  Status:  Discontinued        3.375 g 12.5 mL/hr over 240 Minutes Intravenous Every 8 hours 03/19/11 1802 03/20/11 1013   03/19/11 1815  piperacillin-tazobactam (ZOSYN) IVPB 3.375 g       3.375 g 100 mL/hr over 30 Minutes Intravenous NOW 03/19/11 1802 03/19/11 1923   03/18/11 1100   ceFEPIme (MAXIPIME) 1 g in dextrose 5 % 50 mL IVPB  Status:  Discontinued        1 g 100 mL/hr over 30 Minutes Intravenous Every 12 hours 03/18/11 1009 03/19/11 1624   03/18/11 0900   vancomycin (VANCOCIN) IVPB 1000 mg/200 mL premix  Status:  Discontinued        1,000 mg 200 mL/hr over 60 Minutes Intravenous Every 12 hours 03/18/11 0838 03/20/11 1013   03/17/11 1700   levofloxacin (LEVAQUIN) IVPB 500 mg  Status:  Discontinued        500 mg 100 mL/hr over 60 Minutes Intravenous Every 24 hours 03/17/11 1625 03/19/11 1624         Assessment: 36 year old male with multiple sclerosis with fever on Unasyn, now to start Vancomycin empirically. 03/18/11- Urine culture grew >100K Enterococcus sensitive to ampicillin and vancomycin. Blood cultures NGTD. Obtaining repeat blood cultures today. ?Pneumonia.   WBC wnl. Tmax 102.1. SCr 0.64/estCrCl>149ml/min--caution in interpreting in patient with MS however good body habitus. UOP~1.5 ml/kg/hr.   Goal of Therapy:  Vancomycin trough level 15-20 mcg/ml  Plan:  1. Vancomycin 1750mg  IV x1, then 1g IV  Q12h.  2. Follow-up renal function closely.  3. Follow-up cultures and antibiotic plans.   Link Snuffer, PharmD, BCPS Clinical Pharmacist [phone] 03/23/2011,5:16 PM

## 2011-03-24 LAB — CBC
HCT: 33.9 % — ABNORMAL LOW (ref 39.0–52.0)
MCV: 83.7 fL (ref 78.0–100.0)
RBC: 4.05 MIL/uL — ABNORMAL LOW (ref 4.22–5.81)
RDW: 13.8 % (ref 11.5–15.5)
WBC: 5.8 10*3/uL (ref 4.0–10.5)

## 2011-03-24 LAB — CULTURE, BLOOD (ROUTINE X 2)
Culture: NO GROWTH
Culture: NO GROWTH

## 2011-03-24 LAB — BASIC METABOLIC PANEL
CO2: 22 mEq/L (ref 19–32)
Chloride: 101 mEq/L (ref 96–112)
Creatinine, Ser: 0.62 mg/dL (ref 0.50–1.35)

## 2011-03-24 MED ORDER — STARCH (THICKENING) PO POWD
ORAL | Status: DC | PRN
  Filled 2011-03-24: qty 227

## 2011-03-24 MED ORDER — BUPROPION HCL ER (SR) 150 MG PO TB12
150.0000 mg | ORAL_TABLET | Freq: Two times a day (BID) | ORAL | Status: DC
  Administered 2011-03-24 – 2011-03-26 (×4): 150 mg via ORAL
  Filled 2011-03-24 (×5): qty 1

## 2011-03-24 MED ORDER — ENOXAPARIN SODIUM 40 MG/0.4ML ~~LOC~~ SOLN
40.0000 mg | SUBCUTANEOUS | Status: DC
  Administered 2011-03-24 – 2011-03-25 (×2): 40 mg via SUBCUTANEOUS
  Filled 2011-03-24 (×3): qty 0.4

## 2011-03-24 MED ORDER — POLYETHYLENE GLYCOL 3350 17 G PO PACK
17.0000 g | PACK | Freq: Every day | ORAL | Status: DC
  Administered 2011-03-24 – 2011-03-26 (×2): 17 g via ORAL
  Filled 2011-03-24 (×3): qty 1

## 2011-03-24 MED ORDER — FLEET ENEMA 7-19 GM/118ML RE ENEM
1.0000 | ENEMA | Freq: Every day | RECTAL | Status: DC | PRN
  Administered 2011-03-24: 1 via RECTAL
  Filled 2011-03-24: qty 1

## 2011-03-24 MED ORDER — PHENOL 1.4 % MT LIQD
1.0000 | OROMUCOSAL | Status: DC | PRN
  Administered 2011-03-24: 1 via OROMUCOSAL
  Filled 2011-03-24: qty 177

## 2011-03-24 NOTE — Progress Notes (Addendum)
Progress Note from the Palliative Medicine Team at [facility]  Subjective: Patient seen and examined, no fever today. Eating is fair. Patient does not want surgery for peg tube.      Objective: No Known Allergies Scheduled Meds:   . ampicillin-sulbactam (UNASYN) IV  3 g Intravenous Q6H  . buPROPion  150 mg Oral BID  . fentaNYL      . interferon beta-1b  0.3 mg Subcutaneous QODAY  . methocarbamol(ROBAXIN) IV  500 mg Intravenous Q8H  . midazolam      . polyethylene glycol  17 g Oral Daily  . vancomycin  1,750 mg Intravenous Once  . vancomycin  1,000 mg Intravenous Q12H   Continuous Infusions:   . dextrose 5 % with kcl 50 mL/hr at 03/24/11 1350   PRN Meds:.acetaminophen, acetaminophen, alum & mag hydroxide-simeth, diphenhydrAMINE, food thickener, food thickener, ibuprofen, morphine injection, ondansetron (ZOFRAN) IV, ondansetron, sodium phosphate  BP 121/82  Pulse 96  Temp(Src) 99.7 F (37.6 C) (Oral)  Resp 18  Ht 6\' 4"  (1.93 m)  Wt 83.7 kg (184 lb 8.4 oz)  BMI 22.46 kg/m2  SpO2 95%      Intake/Output Summary (Last 24 hours) at 03/24/11 1753 Last data filed at 03/24/11 1400  Gross per 24 hour  Intake 2492.5 ml  Output   3250 ml  Net -757.5 ml        Physical Exam:  General: alert, responding better HEENT: Oral mucosa is moist Chest: Clear Bilaterally CVS:  s1S2 RRR Abdomen: Soft, nontender Ext: No edema   Labs: CBC    Component Value Date/Time   WBC 5.8 03/24/2011 0710   RBC 4.05* 03/24/2011 0710   HGB 11.6* 03/24/2011 0710   HCT 33.9* 03/24/2011 0710   PLT 451* 03/24/2011 0710   MCV 83.7 03/24/2011 0710   MCH 28.6 03/24/2011 0710   MCHC 34.2 03/24/2011 0710   RDW 13.8 03/24/2011 0710   LYMPHSABS 1.0 03/13/2011 0500   MONOABS 0.4 03/13/2011 0500   EOSABS 0.0 03/13/2011 0500   BASOSABS 0.0 03/13/2011 0500    BMET    Component Value Date/Time   NA 135 03/24/2011 0710   K 3.9 03/24/2011 0710   CL 101 03/24/2011 0710   CO2 22 03/24/2011 0710   GLUCOSE 96  03/24/2011 0710   BUN <3* 03/24/2011 0710   CREATININE 0.62 03/24/2011 0710   CALCIUM 9.2 03/24/2011 0710   GFRNONAA >90 03/24/2011 0710   GFRAA >90 03/24/2011 0710    CMP     Component Value Date/Time   NA 135 03/24/2011 0710   K 3.9 03/24/2011 0710   CL 101 03/24/2011 0710   CO2 22 03/24/2011 0710   GLUCOSE 96 03/24/2011 0710   BUN <3* 03/24/2011 0710   CREATININE 0.62 03/24/2011 0710   CALCIUM 9.2 03/24/2011 0710   PROT 6.5 03/12/2011 2054   ALBUMIN 3.1* 03/12/2011 2054   AST 23 03/12/2011 2054   ALT 18 03/12/2011 2054   ALKPHOS 98 03/12/2011 2054   BILITOT 0.4 03/12/2011 2054   GFRNONAA >90 03/24/2011 0710   GFRAA >90 03/24/2011 0710         1     Assessment and Plan: Multiple sclerosis UTI Fever   Had long discussion with family, both mother and wife, discussed the options from going home vs SNF. At this time family would like to take him home if they can arrange for transportation for doctor visits. If not they would consider the option for SNF.  CODE Status:  Discussed with patient,  wife and mother at bedside, the CPR, Intubation, mechanical ventilation, if he develops cardiopulmonary arrest. Patient has decided to be DNR. Family supports his decision.  Family also would consider comfort care approach in future,  once patient's condition  declines and he becomes unresponsive and not interacting with them.    1.   Patient Documents Completed or Given: Document Given Completed  Advanced Directives Pkt    MOST    DNR    Gone from My Sight    Hard Choices      Time In Time Out Total Time Spent with Patient Total Overall Time  5:00 pm 6:00 pm 15 min 60 min    Greater than 50%  of this time was spent counseling and coordinating care related to the above assessment and plan.     1

## 2011-03-24 NOTE — Progress Notes (Addendum)
PATIENT DETAILS Name: Terry Palmer Age: 36 y.o. Sex: male Date of Birth: [birth year] Admit Date: 03/12/2011 VWU:[id number],NWGNF A, MD, MD  Subjective: More awake today-IR unable to place peg tube,family discussed with [facility]CS-now leaning against Febrile yesterday pm-no further fever since then  Objective: Vital signs in last 24 hours: Filed Vitals:   03/23/11 1515 03/23/11 1757 03/23/11 2215 03/24/11 0436  BP:   105/70 108/74  Pulse:   91 86  Temp: 101.4 F (38.6 C) 100.3 F (37.9 C) 98.4 F (36.9 C) 98.1 F (36.7 C)  TempSrc: Axillary Oral Oral Oral  Resp:   20 20  Height:      Weight:    83.7 kg (184 lb 8.4 oz)  SpO2:   97% 100%    Weight change:   Body mass index is 22.46 kg/(m^2).  Intake/Output from previous day:  Intake/Output Summary (Last 24 hours) at 03/24/11 1119 Last data filed at 03/24/11 0900  Gross per 24 hour  Intake 2492.5 ml  Output   2900 ml  Net -407.5 ml    PHYSICAL EXAM: Gen Exam: Awake and alert  Neck: Supple, No JVD.   Chest: B/L Clear.   CVS: S1 S2 Regular, no murmurs.  Abdomen: soft, BS +, non tender, non distended.  Extremities: no edema, lower extremities warm to touch.   CONSULTS: Palliative Care Urology  LAB RESULTS: CBC  Lab 03/24/11 0710 03/21/11 0500 03/20/11 0516 03/19/11 0615 03/18/11 0551  WBC 5.8 6.5 8.0 13.4* 16.1*  HGB 11.6* 10.8* 11.6* 11.9* 12.6*  HCT 33.9* 31.4* 34.4* 34.0* 36.2*  PLT 451* 389 413* 392 380  MCV 83.7 83.1 84.3 83.1 83.8  MCH 28.6 28.6 28.4 29.1 29.2  MCHC 34.2 34.4 33.7 35.0 34.8  RDW 13.8 13.7 13.7 13.4 13.5  LYMPHSABS -- -- -- -- --  MONOABS -- -- -- -- --  EOSABS -- -- -- -- --  BASOSABS -- -- -- -- --  BANDABS -- -- -- -- --    Chemistries   Lab 03/24/11 0710 03/23/11 0500 03/22/11 0510 03/21/11 0500 03/20/11 0516  NA 135 138 133* 138 135  K 3.9 4.1 3.3* 3.1* 3.6  CL 101 104 102 105 104  CO2 22 23 21 21 19   GLUCOSE 96 102* 105* 105* 88  BUN <3* <3* <3* <3* 6  CREATININE 0.62  0.64 0.65 0.64 0.83  CALCIUM 9.2 9.2 8.8 8.7 9.2  MG -- -- -- -- --    GFR Estimated Creatinine Clearance: 149.7 ml/min (by C-G formula based on Cr of 0.62).  Coagulation profile No results found for this basename: INR:5,PROTIME:5 in the last 168 hours  Cardiac Enzymes No results found for this basename: CK:3,CKMB:3,TROPONINI:3,MYOGLOBIN:3 in the last 168 hours  No components found with this basename: POCBNP:3 No results found for this basename: DDIMER:2 in the last 72 hours No results found for this basename: HGBA1C:2 in the last 72 hours No results found for this basename: CHOL:2,HDL:2,LDLCALC:2,TRIG:2,CHOLHDL:2,LDLDIRECT:2 in the last 72 hours No results found for this basename: TSH,T4TOTAL,FREET3,T3FREE,THYROIDAB in the last 72 hours No results found for this basename: VITAMINB12:2,FOLATE:2,FERRITIN:2,TIBC:2,IRON:2,RETICCTPCT:2 in the last 72 hours No results found for this basename: LIPASE:2,AMYLASE:2 in the last 72 hours  Urine Studies No results found for this basename: UACOL:2,UAPR:2,USPG:2,UPH:2,UTP:2,UGL:2,UKET:2,UBIL:2,UHGB:2,UNIT:2,UROB:2,ULEU:2,UEPI:2,UWBC:2,URBC:2,UBAC:2,CAST:2,CRYS:2,UCOM:2,BILUA:2 in the last 72 hours  MICROBIOLOGY: Recent Results (from the past 240 hour(s))  URINE CULTURE     Status: Normal   Collection Time   03/18/11  8:26 AM      Component Value Range Status  Comment   Specimen Description URINE, RANDOM   Final    Special Requests NONE   Final    Culture  Setup Time [id number]   Final    Colony Count >=100,000 COLONIES/ML   Final    Culture ENTEROCOCCUS SPECIES   Final    Report Status 03/20/2011 FINAL   Final    Organism ID, Bacteria ENTEROCOCCUS SPECIES   Final   CULTURE, BLOOD (ROUTINE X 2)     Status: Normal   Collection Time   03/18/11 11:20 AM      Component Value Range Status Comment   Specimen Description BLOOD RIGHT ARM   Final    Special Requests BOTTLES DRAWN AEROBIC AND ANAEROBIC 10CC   Final    Culture  Setup Time  [id number]   Final    Culture NO GROWTH 5 DAYS   Final    Report Status 03/24/2011 FINAL   Final   CULTURE, BLOOD (ROUTINE X 2)     Status: Normal   Collection Time   03/18/11 11:25 AM      Component Value Range Status Comment   Specimen Description BLOOD RIGHT HAND   Final    Special Requests BOTTLES DRAWN AEROBIC AND ANAEROBIC 10CC   Final    Culture  Setup Time [id number]   Final    Culture NO GROWTH 5 DAYS   Final    Report Status 03/24/2011 FINAL   Final     RADIOLOGY STUDIES/RESULTS: Ct Abdomen Pelvis Wo Contrast  03/12/2011  *RADIOLOGY REPORT*  Clinical Data: Microscopic hematuria.  CT ABDOMEN AND PELVIS WITHOUT CONTRAST  Technique:  Multidetector CT imaging of the abdomen and pelvis was performed following the standard protocol without intravenous contrast.  Comparison: None.  Findings: Asymmetric elevation of the left hemidiaphragm noted.  No focal abnormalities seen in the liver or spleen on this study performed without intravenous contrast material.  The stomach, the duodenum, pancreas, gallbladder, and adrenal glands are unremarkable.  No stones are seen in either kidney.  There is no hydronephrosis. No secondary changes in either kidney.  No abdominal aortic aneurysm.  No free fluid or lymphadenopathy in the abdomen.  The abdominal bowel loops are unremarkable.  Imaging through the pelvis shows a prominent stool volume in the rectal vault.  No intraperitoneal free fluid.  Multiple stones are seen in the bladder lumen, measuring up to 1.5 cm in diameter.  No pelvic sidewall lymphadenopathy.  There is some abnormal edema in the presacral soft tissues of indeterminate etiology.  The terminal ileum and the appendix are normal in appearance.  Bone windows reveal no worrisome lytic or sclerotic osseous lesions.  IMPRESSION: No renal or ureteral calculi.  No secondary changes in either kidney.  Multiple bladder stones are evident.  Original Report Authenticated By: ERIC A. MANSELL, M.D.    Ct Head Wo Contrast  03/12/2011  *RADIOLOGY REPORT*  Clinical Data: Mental status changes.  Not eating.  Speech disturbance.  History multiple sclerosis.  CT HEAD WITHOUT CONTRAST  Technique:  Contiguous axial images were obtained from the base of the skull through the vertex without contrast.  Comparison: MRI 10/20/2010  Findings: the brain shows generalized atrophy.  There is abnormal low density throughout the hemispheric white matter consistent with the patient's known chronic demyelinating disease.  This does not appear to have grossly changed.  No sign of acute infarction, mass lesion, hemorrhage, hydrocephalus or extra-axial collection. Sinuses, middle ears and mastoids are clear.  No skull or skull base lesion.  IMPRESSION: Chronic white matter lesions in brain atrophy consistent with chronic multiple sclerosis.  No sign of any identifiable acute process.  Original Report Authenticated By: Thomasenia Sales, M.D.   Mr Laqueta Jean Wo Contrast  03/15/2011  *RADIOLOGY REPORT*  Clinical Data:  History of multiple sclerosis.  Decrease in mental status.  Please note present examination was performed over 2 days as the patient could not tolerate imaging one time.  MRI HEAD WITHOUT AND WITH CONTRAST MRI CERVICAL SPINE WITHOUT AND WITH CONTRAST  Technique:  Multiplanar, multiecho pulse sequences of the brain and surrounding structures, and cervical spine, to include the craniocervical junction and cervicothoracic junction, were obtained without and with intravenous contrast.  Contrast: ,10mL MULTIHANCE GADOBENATE DIMEGLUMINE 529 MG/ML IV SOLN  Comparison:  10/20/2010.  MRI HEAD  Findings:  No acute infarct.  Mild progression of the severe demyelinating disease with change most apparent in the posterior-superior periventricular region and periatrial region.  None of these areas demonstrates enhancement or restricted motion as can be seen with active areas of demyelination.  No intracranial mass.  No intracranial  hemorrhage.  Global atrophy without hydrocephalus.  Major intracranial vascular structures are patent.  Minimal paranasal sinus mucosal thickening.  Cervical spondylotic changes as discussed below.  IMPRESSION: Mild progression of the severe demyelinating disease with change most apparent in the posterior-superior periventricular region and periatrial region.  None of these areas demonstrates enhancement or restricted motion as can be seen with active areas of demyelination.  MRI CERVICAL SPINE  Findings: Diffuse altered signal intensity throughout the cervical and thoracic spine consistent with demyelinating process.  It is difficult to make direct comparison to the most recent prior exam as the prior exam is motion degraded.  Grossly there appears been no significant change.  None of these areas demonstrates enhancement to suggest active demyelination.  C2-3:  Bulge/shallow protrusion.  C3-4:  Shallow broad-based protrusion greater to the right with minimal cord flattening.  C4-5:  Minimal bulge.  Minimal Schmorl's node deformity.  C5-6:  Minimal to mild bulge.  C6-7:  Minimal Schmorl's node deformity.  Minimal bulge.  C7-T1:  Negative.  2 HU hemangioma unchanged.  IMPRESSION: Relatively similar appearance of demyelination involving the cervical and thoracic cord taking into account that the prior examination was motion degraded.  Similar appearance of disc disease most notable C3-4 level.  Please see above.  Original Report Authenticated By: Fuller Canada, M.D.   Mr Cervical Spine W Wo Contrast  03/15/2011  *RADIOLOGY REPORT*  Clinical Data:  History of multiple sclerosis.  Decrease in mental status.  Please note present examination was performed over 2 days as the patient could not tolerate imaging one time.  MRI HEAD WITHOUT AND WITH CONTRAST MRI CERVICAL SPINE WITHOUT AND WITH CONTRAST  Technique:  Multiplanar, multiecho pulse sequences of the brain and surrounding structures, and cervical spine, to  include the craniocervical junction and cervicothoracic junction, were obtained without and with intravenous contrast.  Contrast: ,10mL MULTIHANCE GADOBENATE DIMEGLUMINE 529 MG/ML IV SOLN  Comparison:  10/20/2010.  MRI HEAD  Findings:  No acute infarct.  Mild progression of the severe demyelinating disease with change most apparent in the posterior-superior periventricular region and periatrial region.  None of these areas demonstrates enhancement or restricted motion as can be seen with active areas of demyelination.  No intracranial mass.  No intracranial hemorrhage.  Global atrophy without hydrocephalus.  Major intracranial vascular structures are patent.  Minimal paranasal sinus mucosal thickening.  Cervical spondylotic changes as  discussed below.  IMPRESSION: Mild progression of the severe demyelinating disease with change most apparent in the posterior-superior periventricular region and periatrial region.  None of these areas demonstrates enhancement or restricted motion as can be seen with active areas of demyelination.  MRI CERVICAL SPINE  Findings: Diffuse altered signal intensity throughout the cervical and thoracic spine consistent with demyelinating process.  It is difficult to make direct comparison to the most recent prior exam as the prior exam is motion degraded.  Grossly there appears been no significant change.  None of these areas demonstrates enhancement to suggest active demyelination.  C2-3:  Bulge/shallow protrusion.  C3-4:  Shallow broad-based protrusion greater to the right with minimal cord flattening.  C4-5:  Minimal bulge.  Minimal Schmorl's node deformity.  C5-6:  Minimal to mild bulge.  C6-7:  Minimal Schmorl's node deformity.  Minimal bulge.  C7-T1:  Negative.  2 HU hemangioma unchanged.  IMPRESSION: Relatively similar appearance of demyelination involving the cervical and thoracic cord taking into account that the prior examination was motion degraded.  Similar appearance of disc  disease most notable C3-4 level.  Please see above.  Original Report Authenticated By: Fuller Canada, M.D.   Dg Chest Port 1 View  03/18/2011  *RADIOLOGY REPORT*  Clinical Data: Aspiration.  PORTABLE CHEST - 1 VIEW  Comparison: Chest x-ray 03/13/2011.  Findings: Lung volumes are low.  No consolidative airspace disease. No pleural effusions.  Pulmonary vasculature and the cardiomediastinal silhouette are within normal limits.  IMPRESSION:  1.  No radiographic evidence of acute cardiopulmonary disease.  Low lung volumes.  Original Report Authenticated By: Florencia Reasons, M.D.   Dg Chest Port 1 View  03/13/2011  *RADIOLOGY REPORT*  Clinical Data: Pneumonia.  PORTABLE CHEST - 1 VIEW  Comparison: 03/12/2011.  Findings: The cardiac silhouette, mediastinal and hilar contours are within normal limits and stable.  Stable mild eventration of the left hemidiaphragm with mild overlying atelectasis.  Low lung volumes with mild vascular crowding but no infiltrates, edema or effusions.  IMPRESSION: No significant pulmonary findings.  Original Report Authenticated By: P. Loralie Champagne, M.D.   Dg Chest Portable 1 View  03/12/2011  *RADIOLOGY REPORT*  Clinical Data: Multiple sclerosis.  Failure to thrive.  PORTABLE CHEST - 1 VIEW  Comparison: 12/27/2010  Findings: 1715 hours.  Lung volumes are low. The lungs are clear without focal infiltrate, edema, pneumothorax or pleural effusion. The cardiopericardial silhouette is within normal limits for size. Imaged bony structures of the thorax are intact. Telemetry leads overlie the chest.  IMPRESSION: Low volume film without acute findings.  Original Report Authenticated By: ERIC A. MANSELL, M.D.    MEDICATIONS: Scheduled Meds:    . ampicillin-sulbactam (UNASYN) IV  3 g Intravenous Q6H  . fentaNYL      . interferon beta-1b  0.3 mg Subcutaneous QODAY  . methocarbamol(ROBAXIN) IV  500 mg Intravenous Q8H  . midazolam      . polyethylene glycol  17 g Oral Daily  .  vancomycin  1,750 mg Intravenous Once  . vancomycin  1,000 mg Intravenous Q12H  . DISCONTD: vancomycin  1,000 mg Intravenous Q8H   Continuous Infusions:    . dextrose 5 % with kcl 50 mL/hr at 03/23/11 1524   PRN Meds:.acetaminophen, acetaminophen, alum & mag hydroxide-simeth, diphenhydrAMINE, food thickener, food thickener, ibuprofen, morphine injection, ondansetron (ZOFRAN) IV, ondansetron, sodium phosphate  Antibiotics: Anti-infectives     Start     Dose/Rate Route Frequency Ordered Stop   03/24/11 0500  vancomycin (VANCOCIN) IVPB 1000 mg/200 mL premix        1,000 mg 200 mL/hr over 60 Minutes Intravenous Every 12 hours 03/23/11 1729     03/24/11 0200   vancomycin (VANCOCIN) IVPB 1000 mg/200 mL premix  Status:  Discontinued        1,000 mg 200 mL/hr over 60 Minutes Intravenous Every 8 hours 03/23/11 1726 03/23/11 1729   03/23/11 1800   vancomycin (VANCOCIN) 1,750 mg in sodium chloride 0.9 % 500 mL IVPB        1,750 mg 250 mL/hr over 120 Minutes Intravenous  Once 03/23/11 1726 03/23/11 2126   03/23/11 0900   ceFAZolin (ANCEF) IVPB 1 g/50 mL premix        1 g 100 mL/hr over 30 Minutes Intravenous  Once 03/23/11 0917 03/23/11 1610   03/22/11 1619   ceFAZolin (ANCEF) 1-5 GM-% IVPB     Comments: BARR, KAILEE: cabinet override         03/22/11 1619 03/23/11 0429   03/20/11 1200   Ampicillin-Sulbactam (UNASYN) 3 g in sodium chloride 0.9 % 100 mL IVPB        3 g 100 mL/hr over 60 Minutes Intravenous Every 6 hours 03/20/11 1013     03/20/11 0000   piperacillin-tazobactam (ZOSYN) IVPB 3.375 g  Status:  Discontinued        3.375 g 12.5 mL/hr over 240 Minutes Intravenous Every 8 hours 03/19/11 1802 03/20/11 1013   03/19/11 1815  piperacillin-tazobactam (ZOSYN) IVPB 3.375 g       3.375 g 100 mL/hr over 30 Minutes Intravenous NOW 03/19/11 1802 03/19/11 1923   03/18/11 1100   ceFEPIme (MAXIPIME) 1 g in dextrose 5 % 50 mL IVPB  Status:  Discontinued        1 g 100 mL/hr over 30  Minutes Intravenous Every 12 hours 03/18/11 1009 03/19/11 1624   03/18/11 0900   vancomycin (VANCOCIN) IVPB 1000 mg/200 mL premix  Status:  Discontinued        1,000 mg 200 mL/hr over 60 Minutes Intravenous Every 12 hours 03/18/11 0838 03/20/11 1013   03/17/11 1700   levofloxacin (LEVAQUIN) IVPB 500 mg  Status:  Discontinued        500 mg 100 mL/hr over 60 Minutes Intravenous Every 24 hours 03/17/11 1625 03/19/11 1624          Assessment/Plan: Patient Active Hospital Problem List: Fever -while on Unasyn -CXR did not show PNA-but high risk of aspiration-given advanced MS -will empirically continue with Vancomycin -will culture again if febrile  Enterococcus UTI -continue with Unasyn -will transition to augmentin on dischargte  Encephalopathy  -likely may have had worsening encephalopathy from baseline on admission-from UTI -current mental status now close to baseline  Hematuria/Bladder Stones -per urology not a operative/Procedure candidate  Neurogenic Bladder -foley in place -family hesitant to try straight cath at home, therefore will keep Foley in place.  Hypokakemia -resolved, monitor lytes periodically  Dysphagia - Peg Tube not able to be placed by IR and GI-family talked with CCS and now very reluctant to proceed-continue with Dys 1 diet-family accepting risks of aspiration  Multiple sclerosis -advanced -continue with Betaseron  Quadriparesis (muscle weakness) -from advanced MS -bed bound-with contractures  Disposition: Unclear-will need to talk with family to see if they are interested in SNF or home with home health services  DVT Prophylaxis: Resume Lovenox  Code Status: Full Code  Repeat goals of care meeting with the palliative care team scheduled later  todayMaretta Bees, fifth  MD. 03/24/2011, 11:19 AM

## 2011-03-24 NOTE — Plan of Care (Signed)
Problem: Phase II Progression Outcomes Goal: Vital signs remain stable Outcome: Progressing Patient with no fever for the shift

## 2011-03-24 NOTE — Progress Notes (Signed)
Patient received fleets enema and had a Huge BM. Great result Patient reports feeling better.

## 2011-03-25 MED ORDER — SODIUM CHLORIDE 0.9 % IV BOLUS (SEPSIS)
500.0000 mL | Freq: Once | INTRAVENOUS | Status: AC
  Administered 2011-03-25: 500 mL via INTRAVENOUS

## 2011-03-25 NOTE — Progress Notes (Signed)
PATIENT DETAILS Name: Terry Palmer Age: 36 y.o. Sex: male Date of Birth: 1975/06/30 Admit Date: 03/12/2011 ZOX:[id number] A, MD, MD  Subjective: Afebrile for 48 hours-awake-no major issues overnight  Objective: Vital signs in last 24 hours: Filed Vitals:   03/24/11 0436 03/24/11 1400 03/24/11 2251 03/25/11 0526  BP: 108/74 121/82 113/70 88/59  Pulse: 86 96 99 91  Temp: 98.1 F (36.7 C) 99.7 F (37.6 C) 98.9 F (37.2 C) 98.4 F (36.9 C)  TempSrc: Oral Oral Oral Axillary  Resp: 20 18 18 18   Height:      Weight: 83.7 kg (184 lb 8.4 oz)   85.1 kg (187 lb 9.8 oz)  SpO2: 100% 95% 94% 97%    Weight change: 1.139 kg (2 lb 8.2 oz)  Body mass index is 22.84 kg/(m^2).  Intake/Output from previous day:  Intake/Output Summary (Last 24 hours) at 03/25/11 1037 Last data filed at 03/25/11 0547  Gross per 24 hour  Intake 3673.33 ml  Output   3300 ml  Net 373.33 ml    PHYSICAL EXAM: Gen Exam: Awake and alert  Neck: Supple, No JVD.   Chest: B/L Clear.   CVS: S1 S2 Regular, no murmurs.  Abdomen: soft, BS +, non tender, non distended.  Extremities: no edema, lower extremities warm to touch.   CONSULTS: [facility]  LAB RESULTS: CBC  Lab 03/24/11 0710 03/21/11 0500 03/20/11 0516 03/19/11 0615  WBC 5.8 6.5 8.0 13.4*  HGB 11.6* 10.8* 11.6* 11.9*  HCT 33.9* 31.4* 34.4* 34.0*  PLT 451* 389 413* 392  MCV 83.7 83.1 84.3 83.1  MCH 28.6 28.6 28.4 29.1  MCHC 34.2 34.4 33.7 35.0  RDW 13.8 13.7 13.7 13.4  LYMPHSABS -- -- -- --  MONOABS -- -- -- --  EOSABS -- -- -- --  BASOSABS -- -- -- --  BANDABS -- -- -- --    Chemistries   Lab 03/24/11 0710 03/23/11 0500 03/22/11 0510 03/21/11 0500 03/20/11 0516  NA 135 138 133* 138 135  K 3.9 4.1 3.3* 3.1* 3.6  CL 101 104 102 105 104  CO2 22 23 21 21 19   GLUCOSE 96 102* 105* 105* 88  BUN <3* <3* <3* <3* 6  CREATININE 0.62 0.64 0.65 0.64 0.83  CALCIUM 9.2 9.2 8.8 8.7 9.2  MG -- -- -- -- --    GFR Estimated  Creatinine Clearance: 152.2 ml/min (by C-G formula based on Cr of 0.62).  Coagulation profile No results found for this basename: INR:5,PROTIME:5 in the last 168 hours  Cardiac Enzymes No results found for this basename: CK:3,CKMB:3,TROPONINI:3,MYOGLOBIN:3 in the last 168 hours  No components found with this basename: POCBNP:3 No results found for this basename: DDIMER:2 in the last 72 hours No results found for this basename: HGBA1C:2 in the last 72 hours No results found for this basename: CHOL:2,HDL:2,LDLCALC:2,TRIG:2,CHOLHDL:2,LDLDIRECT:2 in the last 72 hours No results found for this basename: TSH,T4TOTAL,FREET3,T3FREE,THYROIDAB in the last 72 hours No results found for this basename: VITAMINB12:2,FOLATE:2,FERRITIN:2,TIBC:2,IRON:2,RETICCTPCT:2 in the last 72 hours No results found for this basename: LIPASE:2,AMYLASE:2 in the last 72 hours  Urine Studies No results found for this basename: UACOL:2,UAPR:2,USPG:2,UPH:2,UTP:2,UGL:2,UKET:2,UBIL:2,UHGB:2,UNIT:2,UROB:2,ULEU:2,UEPI:2,UWBC:2,URBC:2,UBAC:2,CAST:2,CRYS:2,UCOM:2,BILUA:2 in the last 72 hours  MICROBIOLOGY: Recent Results (from the past 240 hour(s))  URINE CULTURE     Status: Normal   Collection Time   03/18/11  8:26 AM      Component Value Range Status Comment   Specimen Description URINE, RANDOM   Final    Special Requests NONE  Final    Culture  Setup Time [id number]   Final    Colony Count >=100,000 COLONIES/ML   Final    Culture ENTEROCOCCUS SPECIES   Final    Report Status 03/20/2011 FINAL   Final    Organism ID, Bacteria ENTEROCOCCUS SPECIES   Final   CULTURE, BLOOD (ROUTINE X 2)     Status: Normal   Collection Time   03/18/11 11:20 AM      Component Value Range Status Comment   Specimen Description BLOOD RIGHT ARM   Final    Special Requests BOTTLES DRAWN AEROBIC AND ANAEROBIC 10CC   Final    Culture  Setup Time [id number]   Final    Culture NO GROWTH 5 DAYS   Final    Report Status 03/24/2011 FINAL   Final    CULTURE, BLOOD (ROUTINE X 2)     Status: Normal   Collection Time   03/18/11 11:25 AM      Component Value Range Status Comment   Specimen Description BLOOD RIGHT HAND   Final    Special Requests BOTTLES DRAWN AEROBIC AND ANAEROBIC 10CC   Final    Culture  Setup Time [id number]   Final    Culture NO GROWTH 5 DAYS   Final    Report Status 03/24/2011 FINAL   Final     RADIOLOGY STUDIES/RESULTS: Ct Abdomen Pelvis Wo Contrast  03/12/2011  *RADIOLOGY REPORT*  Clinical Data: Microscopic hematuria.  CT ABDOMEN AND PELVIS WITHOUT CONTRAST  Technique:  Multidetector CT imaging of the abdomen and pelvis was performed following the standard protocol without intravenous contrast.  Comparison: None.  Findings: Asymmetric elevation of the left hemidiaphragm noted.  No focal abnormalities seen in the liver or spleen on this study performed without intravenous contrast material.  The stomach, the duodenum, pancreas, gallbladder, and adrenal glands are unremarkable.  No stones are seen in either kidney.  There is no hydronephrosis. No secondary changes in either kidney.  No abdominal aortic aneurysm.  No free fluid or lymphadenopathy in the abdomen.  The abdominal bowel loops are unremarkable.  Imaging through the pelvis shows a prominent stool volume in the rectal vault.  No intraperitoneal free fluid.  Multiple stones are seen in the bladder lumen, measuring up to 1.5 cm in diameter.  No pelvic sidewall lymphadenopathy.  There is some abnormal edema in the presacral soft tissues of indeterminate etiology.  The terminal ileum and the appendix are normal in appearance.  Bone windows reveal no worrisome lytic or sclerotic osseous lesions.  IMPRESSION: No renal or ureteral calculi.  No secondary changes in either kidney.  Multiple bladder stones are evident.  Original Report Authenticated By: ERIC A. MANSELL, M.D.   Ct Head Wo Contrast  03/12/2011  *RADIOLOGY REPORT*  Clinical Data: Mental status changes.  Not eating.   Speech disturbance.  History multiple sclerosis.  CT HEAD WITHOUT CONTRAST  Technique:  Contiguous axial images were obtained from the base of the skull through the vertex without contrast.  Comparison: MRI 10/20/2010  Findings: the brain shows generalized atrophy.  There is abnormal low density throughout the hemispheric white matter consistent with the patient's known chronic demyelinating disease.  This does not appear to have grossly changed.  No sign of acute infarction, mass lesion, hemorrhage, hydrocephalus or extra-axial collection. Sinuses, middle ears and mastoids are clear.  No skull or skull base lesion.  IMPRESSION: Chronic white matter lesions in brain atrophy consistent with chronic multiple sclerosis.  No sign of  any identifiable acute process.  Original Report Authenticated By: Thomasenia Sales, M.D.   Mr Laqueta Jean Wo Contrast  03/15/2011  *RADIOLOGY REPORT*  Clinical Data:  History of multiple sclerosis.  Decrease in mental status.  Please note present examination was performed over 2 days as the patient could not tolerate imaging one time.  MRI HEAD WITHOUT AND WITH CONTRAST MRI CERVICAL SPINE WITHOUT AND WITH CONTRAST  Technique:  Multiplanar, multiecho pulse sequences of the brain and surrounding structures, and cervical spine, to include the craniocervical junction and cervicothoracic junction, were obtained without and with intravenous contrast.  Contrast: ,10mL MULTIHANCE GADOBENATE DIMEGLUMINE 529 MG/ML IV SOLN  Comparison:  10/20/2010.  MRI HEAD  Findings:  No acute infarct.  Mild progression of the severe demyelinating disease with change most apparent in the posterior-superior periventricular region and periatrial region.  None of these areas demonstrates enhancement or restricted motion as can be seen with active areas of demyelination.  No intracranial mass.  No intracranial hemorrhage.  Global atrophy without hydrocephalus.  Major intracranial vascular structures are patent.  Minimal  paranasal sinus mucosal thickening.  Cervical spondylotic changes as discussed below.  IMPRESSION: Mild progression of the severe demyelinating disease with change most apparent in the posterior-superior periventricular region and periatrial region.  None of these areas demonstrates enhancement or restricted motion as can be seen with active areas of demyelination.  MRI CERVICAL SPINE  Findings: Diffuse altered signal intensity throughout the cervical and thoracic spine consistent with demyelinating process.  It is difficult to make direct comparison to the most recent prior exam as the prior exam is motion degraded.  Grossly there appears been no significant change.  None of these areas demonstrates enhancement to suggest active demyelination.  C2-3:  Bulge/shallow protrusion.  C3-4:  Shallow broad-based protrusion greater to the right with minimal cord flattening.  C4-5:  Minimal bulge.  Minimal Schmorl's node deformity.  C5-6:  Minimal to mild bulge.  C6-7:  Minimal Schmorl's node deformity.  Minimal bulge.  C7-T1:  Negative.  2 HU hemangioma unchanged.  IMPRESSION: Relatively similar appearance of demyelination involving the cervical and thoracic cord taking into account that the prior examination was motion degraded.  Similar appearance of disc disease most notable C3-4 level.  Please see above.  Original Report Authenticated By: Fuller Canada, M.D.   Mr Cervical Spine W Wo Contrast  03/15/2011  *RADIOLOGY REPORT*  Clinical Data:  History of multiple sclerosis.  Decrease in mental status.  Please note present examination was performed over 2 days as the patient could not tolerate imaging one time.  MRI HEAD WITHOUT AND WITH CONTRAST MRI CERVICAL SPINE WITHOUT AND WITH CONTRAST  Technique:  Multiplanar, multiecho pulse sequences of the brain and surrounding structures, and cervical spine, to include the craniocervical junction and cervicothoracic junction, were obtained without and with intravenous contrast.   Contrast: ,10mL MULTIHANCE GADOBENATE DIMEGLUMINE 529 MG/ML IV SOLN  Comparison:  10/20/2010.  MRI HEAD  Findings:  No acute infarct.  Mild progression of the severe demyelinating disease with change most apparent in the posterior-superior periventricular region and periatrial region.  None of these areas demonstrates enhancement or restricted motion as can be seen with active areas of demyelination.  No intracranial mass.  No intracranial hemorrhage.  Global atrophy without hydrocephalus.  Major intracranial vascular structures are patent.  Minimal paranasal sinus mucosal thickening.  Cervical spondylotic changes as discussed below.  IMPRESSION: Mild progression of the severe demyelinating disease with change most apparent in the  posterior-superior periventricular region and periatrial region.  None of these areas demonstrates enhancement or restricted motion as can be seen with active areas of demyelination.  MRI CERVICAL SPINE  Findings: Diffuse altered signal intensity throughout the cervical and thoracic spine consistent with demyelinating process.  It is difficult to make direct comparison to the most recent prior exam as the prior exam is motion degraded.  Grossly there appears been no significant change.  None of these areas demonstrates enhancement to suggest active demyelination.  C2-3:  Bulge/shallow protrusion.  C3-4:  Shallow broad-based protrusion greater to the right with minimal cord flattening.  C4-5:  Minimal bulge.  Minimal Schmorl's node deformity.  C5-6:  Minimal to mild bulge.  C6-7:  Minimal Schmorl's node deformity.  Minimal bulge.  C7-T1:  Negative.  2 HU hemangioma unchanged.  IMPRESSION: Relatively similar appearance of demyelination involving the cervical and thoracic cord taking into account that the prior examination was motion degraded.  Similar appearance of disc disease most notable C3-4 level.  Please see above.  Original Report Authenticated By: Fuller Canada, M.D.   Dg Chest  Port 1 View  03/18/2011  *RADIOLOGY REPORT*  Clinical Data: Aspiration.  PORTABLE CHEST - 1 VIEW  Comparison: Chest x-ray 03/13/2011.  Findings: Lung volumes are low.  No consolidative airspace disease. No pleural effusions.  Pulmonary vasculature and the cardiomediastinal silhouette are within normal limits.  IMPRESSION:  1.  No radiographic evidence of acute cardiopulmonary disease.  Low lung volumes.  Original Report Authenticated By: Florencia Reasons, M.D.   Dg Chest Port 1 View  03/13/2011  *RADIOLOGY REPORT*  Clinical Data: Pneumonia.  PORTABLE CHEST - 1 VIEW  Comparison: 03/12/2011.  Findings: The cardiac silhouette, mediastinal and hilar contours are within normal limits and stable.  Stable mild eventration of the left hemidiaphragm with mild overlying atelectasis.  Low lung volumes with mild vascular crowding but no infiltrates, edema or effusions.  IMPRESSION: No significant pulmonary findings.  Original Report Authenticated By: P. Loralie Champagne, M.D.   Dg Chest Portable 1 View  03/12/2011  *RADIOLOGY REPORT*  Clinical Data: Multiple sclerosis.  Failure to thrive.  PORTABLE CHEST - 1 VIEW  Comparison: 12/27/2010  Findings: 1715 hours.  Lung volumes are low. The lungs are clear without focal infiltrate, edema, pneumothorax or pleural effusion. The cardiopericardial silhouette is within normal limits for size. Imaged bony structures of the thorax are intact. Telemetry leads overlie the chest.  IMPRESSION: Low volume film without acute findings.  Original Report Authenticated By: ERIC A. MANSELL, M.D.    MEDICATIONS: Scheduled Meds:    . ampicillin-sulbactam (UNASYN) IV  3 g Intravenous Q6H  . buPROPion  150 mg Oral BID  . enoxaparin (LOVENOX) injection  40 mg Subcutaneous Q24H  . interferon beta-1b  0.3 mg Subcutaneous QODAY  . methocarbamol(ROBAXIN) IV  500 mg Intravenous Q8H  . polyethylene glycol  17 g Oral Daily  . sodium chloride  500 mL Intravenous Once  . vancomycin  1,000 mg  Intravenous Q12H   Continuous Infusions:    . dextrose 5 % with kcl 50 mL/hr at 03/24/11 1350   PRN Meds:.acetaminophen, acetaminophen, alum & mag hydroxide-simeth, diphenhydrAMINE, food thickener, food thickener, ibuprofen, morphine injection, ondansetron (ZOFRAN) IV, ondansetron, phenol, sodium phosphate  Antibiotics: Anti-infectives     Start     Dose/Rate Route Frequency Ordered Stop   03/24/11 0500   vancomycin (VANCOCIN) IVPB 1000 mg/200 mL premix        1,000 mg 200 mL/hr over  60 Minutes Intravenous Every 12 hours 03/23/11 1729     03/24/11 0200   vancomycin (VANCOCIN) IVPB 1000 mg/200 mL premix  Status:  Discontinued        1,000 mg 200 mL/hr over 60 Minutes Intravenous Every 8 hours 03/23/11 1726 03/23/11 1729   03/23/11 1800   vancomycin (VANCOCIN) 1,750 mg in sodium chloride 0.9 % 500 mL IVPB        1,750 mg 250 mL/hr over 120 Minutes Intravenous  Once 03/23/11 1726 03/23/11 2126   03/23/11 0900   ceFAZolin (ANCEF) IVPB 1 g/50 mL premix        1 g 100 mL/hr over 30 Minutes Intravenous  Once 03/23/11 0917 03/23/11 1610   03/22/11 1619   ceFAZolin (ANCEF) 1-5 GM-% IVPB     Comments: BARR, KAILEE: cabinet override         03/22/11 1619 03/23/11 0429   03/20/11 1200   Ampicillin-Sulbactam (UNASYN) 3 g in sodium chloride 0.9 % 100 mL IVPB        3 g 100 mL/hr over 60 Minutes Intravenous Every 6 hours 03/20/11 1013     03/20/11 0000   piperacillin-tazobactam (ZOSYN) IVPB 3.375 g  Status:  Discontinued        3.375 g 12.5 mL/hr over 240 Minutes Intravenous Every 8 hours 03/19/11 1802 03/20/11 1013   03/19/11 1815   piperacillin-tazobactam (ZOSYN) IVPB 3.375 g        3.375 g 100 mL/hr over 30 Minutes Intravenous NOW 03/19/11 1802 03/19/11 1923   03/18/11 1100   ceFEPIme (MAXIPIME) 1 g in dextrose 5 % 50 mL IVPB  Status:  Discontinued        1 g 100 mL/hr over 30 Minutes Intravenous Every 12 hours 03/18/11 1009 03/19/11 1624   03/18/11 0900   vancomycin (VANCOCIN)  IVPB 1000 mg/200 mL premix  Status:  Discontinued        1,000 mg 200 mL/hr over 60 Minutes Intravenous Every 12 hours 03/18/11 0838 03/20/11 1013   03/17/11 1700   levofloxacin (LEVAQUIN) IVPB 500 mg  Status:  Discontinued        500 mg 100 mL/hr over 60 Minutes Intravenous Every 24 hours 03/17/11 1625 03/19/11 1624          Assessment/Plan: Patient Active Hospital Problem List: Fever -while on Unasyn -CXR did not show PNA-but high risk of aspiration-given advanced MS, no clear cut foci of sepsis evident -will empirically continue with Vancomycin for another 24-48 hours-if continues to be afebrile will then d/c -will culture again if febrile  Enterococcus UTI -continue with Unasyn -will transition to augmentin on discharge  Encephalopathy  -likely may have had worsening encephalopathy from baseline on admission-from UTI -current mental status now close to baseline  Hematuria/Bladder Stones -per urology not a operative/Procedure candidate  Neurogenic Bladder -foley in place -family hesitant to try straight cath at home, therefore will keep Foley in place.  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 03/25/2011, 10:37 AM

## 2011-03-25 NOTE — Progress Notes (Signed)
Physical Therapy Discharge Note  MD order received for PT eval.  Pt is bedbound and has been for quite some time.   Per chart, OT/PT received eval orders 03/13/11.  A family interview was completed at that time, with pt's wife and mother stated they were comfortable performing ROM/stretching with pt.  He received HHOT PTA.  Acute care PT is not indicated.  PT sign off.  Aida Raider, PT  Office # [phone] Pager [phone]

## 2011-03-26 ENCOUNTER — Telehealth (HOSPITAL_COMMUNITY): Payer: Self-pay | Admitting: Radiology

## 2011-03-26 MED ORDER — NITROFURANTOIN MONOHYD MACRO 100 MG PO CAPS: 100.0000 mg | ORAL_CAPSULE | Freq: Two times a day (BID) | ORAL | Status: AC

## 2011-03-26 MED ORDER — AMOXICILLIN-POT CLAVULANATE 400-57 MG/5ML PO SUSR: 875.0000 mg | Freq: Two times a day (BID) | ORAL | Status: AC

## 2011-03-26 MED ORDER — AMOXICILLIN-POT CLAVULANATE 400-57 MG/5ML PO SUSR
875.0000 mg | Freq: Two times a day (BID) | ORAL | Status: DC
  Administered 2011-03-26: 875 mg via ORAL
  Filled 2011-03-26 (×2): qty 10.9

## 2011-03-26 MED ORDER — POLYETHYLENE GLYCOL 3350 17 G PO PACK: 17.0000 g | PACK | Freq: Every day | ORAL | Status: AC

## 2011-03-26 MED ORDER — METHOCARBAMOL 500 MG PO TABS: 500.0000 mg | ORAL_TABLET | Freq: Three times a day (TID) | ORAL | Status: AC

## 2011-03-26 MED ORDER — ACETAMINOPHEN 325 MG PO TABS: 650.0000 mg | ORAL_TABLET | Freq: Four times a day (QID) | ORAL | Status: DC | PRN

## 2011-03-26 NOTE — Progress Notes (Signed)
03/26/2011 Palliative Medicine Team SW 11:26 AM Pt discussed in PMT Rounds and plan reported as return home without hospice. Was informed that family has questions regarding transportation to medical follow up, left contact information for county transportation with pt, no family present. No further needs, available as needed.   Kennieth Francois, [place] Pager [phone]

## 2011-03-26 NOTE — Progress Notes (Signed)
Clinical Child psychotherapist received referral for [employer]. Pt was discussed with RN Case Manager and MD. Per MD, pt family main concern was transporting pt to doctors appointments and if this concern could be resolved then pt family wanted pt to return home with home health services. Per RN Case Manager, RN Case Manager spoke with pt and pt wife and arranged for in home physician services through Dr. Redmond School. Per RN Case Manager, pt and pt wife agreeable to pt return home today and RN Case Manager notified this Clinical Social Worker of need for ambulance transportation and pt wife request for transport to be arranged later in the day in order for pt wife to be present at home. No full psychosocial assessment completed.     Clinical Social Worker arranged ambulance transportation for pt to home at discharge. Clinical Social Worker contacted pt wife and left message to notify and notified pt mother to inform pt wife and pt mother confirmed pt wife was home. No further social work needs identified at this time. Per chart, pt to have home health social worker at home to address any further needs with pt and pt family. Clinical Social Worker signing off.   Jacklynn Lewis, MSW, LCSWA  Clinical Social Work [phone]

## 2011-03-26 NOTE — Progress Notes (Signed)
Speech Language/Pathology Speech Pathology: Dysphagia Treatment Note  Patient was observed with : pt not observed directly with POs   Lung Sounds:  Diminished, clear Temperature: 98.1-100.0   Clinical Impression: Discussed diet decisions with family. PEG unable to be placed, have decided to d/c home with hospice consuming POs. Pt and wife report good tolerance of POs recently with no significant choking episodes. SLP completed education with pt/family regarding aspiration precautions and altering diet as needed with home health SLP depending on pts alertness and strength. Pt likely to fluctuate and family is willing to provided upgraded textures with help of SLP.   Recommendations:  Continue Dys 1 puree/honey thick liquids until pt showing further improvement. Home health SLP.   Pain:   none Intervention Required:   No   Goals: All Goals Met

## 2011-03-26 NOTE — Progress Notes (Signed)
Pt. discharged to home. Pts. Wife has not been answering her phone so I was unable to give her discharge instructions. The EMTs will call me once they get to his house so discharge instructions can be given.  Pt remains stable. No signs and symptoms of distress. Will continue to call wife.Terry Palmer E

## 2011-03-26 NOTE — Progress Notes (Signed)
  Patient seen, no new complaints.  Filed Vitals:   03/26/11 0442  BP: 103/68  Pulse: 96  Temp: 98.3 F (36.8 C)  Resp: 18   Assesment Multiple sclerosis UTI Plan Had a long discussion with patient's mother and wife on 03/24/11 Patient is DNR They are not interested in hospice at this time. They want to take him home with home health, also would like to see if they can get help with transportation for doctor's visit.

## 2011-03-26 NOTE — Progress Notes (Signed)
Nutrition Follow-up  Diet Order:  Dysphagia 1, Nectar thick liquids PO intake mostly 50-100% Patient was waiting for PEG placement which had to be put off for fevers. Now patient declines PEG and accepts risk with PO intake. Now DNR status. No family at bedside at time of RD visit, patient resting with eyes closed.   Meds: Scheduled Meds:   . ampicillin-sulbactam (UNASYN) IV  3 g Intravenous Q6H  . buPROPion  150 mg Oral BID  . enoxaparin (LOVENOX) injection  40 mg Subcutaneous Q24H  . interferon beta-1b  0.3 mg Subcutaneous QODAY  . methocarbamol(ROBAXIN) IV  500 mg Intravenous Q8H  . polyethylene glycol  17 g Oral Daily  . vancomycin  1,000 mg Intravenous Q12H   Continuous Infusions:   . dextrose 5 % with kcl 20 mL/hr at 03/25/11 2349   PRN Meds:.acetaminophen, acetaminophen, alum & mag hydroxide-simeth, diphenhydrAMINE, food thickener, food thickener, ibuprofen, morphine injection, ondansetron (ZOFRAN) IV, ondansetron, phenol, sodium phosphate  Labs:  CMP     Component Value Date/Time   NA 135 03/24/2011 0710   K 3.9 03/24/2011 0710   CL 101 03/24/2011 0710   CO2 22 03/24/2011 0710   GLUCOSE 96 03/24/2011 0710   BUN <3* 03/24/2011 0710   CREATININE 0.62 03/24/2011 0710   CALCIUM 9.2 03/24/2011 0710   PROT 6.5 03/12/2011 2054   ALBUMIN 3.1* 03/12/2011 2054   AST 23 03/12/2011 2054   ALT 18 03/12/2011 2054   ALKPHOS 98 03/12/2011 2054   BILITOT 0.4 03/12/2011 2054   GFRNONAA >90 03/24/2011 0710   GFRAA >90 03/24/2011 0710     Intake/Output Summary (Last 24 hours) at 03/26/11 0948 Last data filed at 03/26/11 1610  Gross per 24 hour  Intake 2574.5 ml  Output   2150 ml  Net  424.5 ml    Weight Status:  186 lbs, stable for about 1 week, up from admission weight Body mass index is 22.76 kg/(m^2).   Re-estimated needs:  1900-2100 kcal, 85-100 gm protein  Nutrition Dx:  Inadequate oral intake, ongoing  Goal:  >90% of estimated nutrition needs to be met through PO and EN, unmet, no  longer applicable New Goal: PO intake as tolerated with in Goals of Care  Intervention:   RD will continue to follow for goals of care  Monitor:  PO intake, desire for supplements, weights   Rudean Haskell Pager #:  [phone]

## 2011-03-26 NOTE — Progress Notes (Signed)
Patient will also have speech therapy with [facility].

## 2011-03-26 NOTE — Progress Notes (Signed)
ANTIBIOTIC CONSULT NOTE - INITIAL  Pharmacy Consult for Vancomycin Indication: Empiric for fever and concern for infection (source unknown)  No Known Allergies  Patient Measurements: Height: 6\' 4"  (193 cm) Weight: 186 lb 15.2 oz (84.8 kg) IBW/kg (Calculated) : 86.8   Vital Signs: Temp: 98.3 F (36.8 C) (03/18 0442) Temp src: Oral (03/18 0442) BP: 103/68 mmHg (03/18 0442) Pulse Rate: 96  (03/18 0442) Intake/Output from previous day: 03/17 0701 - 03/18 0700 In: 2509.5 [P.O.:480; I.V.:1064.5; IV Piggyback:965] Out: 900 [Urine:900] Intake/Output from this shift: Total I/O In: 120 [P.O.:120] Out: 1250 [Urine:1250]  Labs:  [facility] 03/24/11 0710  WBC 5.8  HGB 11.6*  PLT 451*  LABCREA --  CREATININE 0.62   Estimated Creatinine Clearance: 151.6 ml/min (by C-G formula based on Cr of 0.62). No results found for this basename: VANCOTROUGH:2,VANCOPEAK:2,VANCORANDOM:2,GENTTROUGH:2,GENTPEAK:2,GENTRANDOM:2,TOBRATROUGH:2,TOBRAPEAK:2,TOBRARND:2,AMIKACINPEAK:2,AMIKACINTROU:2,AMIKACIN:2, in the last 72 hours   Microbiology: Recent Results (from the past 720 hour(s))  URINE CULTURE     Status: Normal   Collection Time   03/01/11  3:13 PM      Component Value Range Status Comment   Specimen Description URINE, CATHETERIZED   Final    Special Requests none Normal   Final    Culture  Setup Time [id number]   Final    Colony Count >=100,000 COLONIES/ML   Final    Culture ENTEROCOCCUS SPECIES   Final    Report Status 03/03/2011 FINAL   Final    Organism ID, Bacteria ENTEROCOCCUS SPECIES   Final   URINE CULTURE     Status: Normal   Collection Time   03/12/11  2:23 PM      Component Value Range Status Comment   Specimen Description URINE, RANDOM   Final    Special Requests ADDED [id number]   Final    Culture  Setup Time [id number]   Final    Colony Count NO GROWTH   Final    Culture NO GROWTH   Final    Report Status 03/13/2011 FINAL   Final   URINE CULTURE     Status: Normal   Collection Time   03/18/11  8:26 AM      Component Value Range Status Comment   Specimen Description URINE, RANDOM   Final    Special Requests NONE   Final    Culture  Setup Time [id number]   Final    Colony Count >=100,000 COLONIES/ML   Final    Culture ENTEROCOCCUS SPECIES   Final    Report Status 03/20/2011 FINAL   Final    Organism ID, Bacteria ENTEROCOCCUS SPECIES   Final   CULTURE, BLOOD (ROUTINE X 2)     Status: Normal   Collection Time   03/18/11 11:20 AM      Component Value Range Status Comment   Specimen Description BLOOD RIGHT ARM   Final    Special Requests BOTTLES DRAWN AEROBIC AND ANAEROBIC 10CC   Final    Culture  Setup Time [id number]   Final    Culture NO GROWTH 5 DAYS   Final    Report Status 03/24/2011 FINAL   Final   CULTURE, BLOOD (ROUTINE X 2)     Status: Normal   Collection Time   03/18/11 11:25 AM      Component Value Range Status Comment   Specimen Description BLOOD RIGHT HAND   Final    Special Requests BOTTLES DRAWN AEROBIC AND ANAEROBIC 10CC   Final    Culture  Setup Time [id number]  Final    Culture NO GROWTH 5 DAYS   Final    Report Status 03/24/2011 FINAL   Final     Medical History: Past Medical History  Diagnosis Date  . MS (multiple sclerosis)   . UTI (urinary tract infection)   . 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 Childhood asthma   . Depression   . Neuromuscular disorder     Quadraperesis    Medications:  Anti-infectives     Start     Dose/Rate Route Frequency Ordered Stop   03/24/11 0500   vancomycin (VANCOCIN) IVPB 1000 mg/200 mL premix        1,000 mg 200 mL/hr over 60 Minutes Intravenous Every 12 hours 03/23/11 1729     03/24/11 0200   vancomycin (VANCOCIN) IVPB 1000 mg/200 mL premix  Status:  Discontinued        1,000 mg 200 mL/hr over 60 Minutes Intravenous Every 8 hours 03/23/11 1726 03/23/11 1729   03/23/11 1800   vancomycin (VANCOCIN) 1,750 mg in sodium chloride 0.9 % 500 mL IVPB        1,750 mg 250  mL/hr over 120 Minutes Intravenous  Once 03/23/11 1726 03/23/11 2126   03/23/11 0900   ceFAZolin (ANCEF) IVPB 1 g/50 mL premix        1 g 100 mL/hr over 30 Minutes Intravenous  Once 03/23/11 0917 03/23/11 0937   03/22/11 1619   ceFAZolin (ANCEF) 1-5 GM-% IVPB     Comments: BARR, KAILEE: cabinet override         03/22/11 1619 03/23/11 0429   03/20/11 1200   Ampicillin-Sulbactam (UNASYN) 3 g in sodium chloride 0.9 % 100 mL IVPB        3 g 100 mL/hr over 60 Minutes Intravenous Every 6 hours 03/20/11 1013     03/20/11 0000   piperacillin-tazobactam (ZOSYN) IVPB 3.375 g  Status:  Discontinued        3.375 g 12.5 mL/hr over 240 Minutes Intravenous Every 8 hours 03/19/11 1802 03/20/11 1013   03/19/11 1815   piperacillin-tazobactam (ZOSYN) IVPB 3.375 g        3.375 g 100 mL/hr over 30 Minutes Intravenous NOW 03/19/11 1802 03/19/11 1923   03/18/11 1100   ceFEPIme (MAXIPIME) 1 g in dextrose 5 % 50 mL IVPB  Status:  Discontinued        1 g 100 mL/hr over 30 Minutes Intravenous Every 12 hours 03/18/11 1009 03/19/11 1624   03/18/11 0900   vancomycin (VANCOCIN) IVPB 1000 mg/200 mL premix  Status:  Discontinued        1,000 mg 200 mL/hr over 60 Minutes Intravenous Every 12 hours 03/18/11 0838 03/20/11 1013   03/17/11 1700   levofloxacin (LEVAQUIN) IVPB 500 mg  Status:  Discontinued        500 mg 100 mL/hr over 60 Minutes Intravenous Every 24 hours 03/17/11 1625 03/19/11 1624         Assessment: 36 year old male with multiple sclerosis with fever on Unasyn, Vancomycin added empirically 3/15. 03/18/11- Urine culture grew >100K Enterococcus sensitive to ampicillin and vancomycin. Blood cultures NGTD.   WBC wnl. Tmax 100.1. SCr 0.62/estCrCl>125ml/min--caution in interpreting in patient with MS however good body habitus.   Goal of Therapy:  Vancomycin trough level 15-20 mcg/ml  Plan:  1. Continue vancomycin 1g q12.  Per MD plan, will change to Augmentin po at discharge, hopefully today.  No  trough levels for now. 2. Follow-up renal function closely.  3.  Follow-up cultures and antibiotic plans.   Reece Leader, Pharm D 03/26/2011 10:55 AM

## 2011-03-26 NOTE — Progress Notes (Signed)
CARE MANAGEMENT NOTE 03/26/2011  Patient:  Terry Palmer, Terry Palmer   Account Number:  [id number]  Date Initiated:  03/13/2011  Documentation initiated by:  Junius Creamer  Subjective/Objective Assessment:   adm w uti, hx mult sclerosis     Action/Plan:   lives w wife, pcp dr Fleet Contras, [facility]   Anticipated DC Date:  03/26/2011   Anticipated DC Plan:  HOME W HOME HEALTH SERVICES      DC Planning Services  CM consult      [facility] Choice  Resumption Of Svcs/PTA Provider   Choice offered to / List presented to:     DME arranged  AIR OVERLAY MATTRESS  HOSPITAL BED        HH arranged  HH-1 RN  HH-4 NURSE'S AIDE  HH-2 PT  HH-3 OT  HH-6 SOCIAL WORKER      HH agency  [facility]   Status of service:  Completed, signed off Medicare Important Message given?   (If response is "NO", the following Medicare IM given date fields will be blank) Date Medicare IM given:   Date Additional Medicare IM given:    Discharge Disposition:  HOME W HOME HEALTH SERVICES  Per UR Regulation:    If discussed at Long Length of Stay Meetings, dates discussed:   03/21/2011    Comments:  03/26/11 16:32 Letha Cape RN, BSN [phone] patient for dc to home today with [facility] services resumed with [facility], PT, OT , Aide and SW.  Notified Mary with [facility], she states the hospital bed is already at the patient's home with air mattress.  Soc will begin 24-48 hs post discharge.  Admission papers were faxed to Dr. Sherilyn Cooter Tripp's office today to fax number [phone].  I spoke with Skipper Cliche who received these papers and stated she has everything she needs and Dr. Redmond School will see this patient in 2 weeks.  Burt Knack phone is [phone].  Also patient's wife states she does not want pantient to be a DNR, and patient also verified this information when I was in the room, I informed the MD and he updated this information.  Patient will be transported by ambulance, which CSW is  arranging.   03/23/11 15:46 Letha Cape RN, BSN [phone] per MD , wife is wanting snf now, CSW aware.  03/21/11- 1100- Donn Pierini RN, BSN [phone] Will have Margie/PC team come talk with pt's wife/family regarding hospice services for information purposes. Also called [facility] and confirmed services that pt had PTA- pt had PT/OT/ST/RN and an aide that came 2x week. - gel overlay has been delivered to home-pt already has hospital bed. CM to continue to follow for d/c needs.  03/19/11- 1400- Donn Pierini RN, BSN [phone] Per MD family wound like to move forward with peg tube- GI to be consulted. Pt with fevers- unsure when peg tube to be placed. PC consulted and met with family over weekend. CM to continue to follow for d/c needs and planning.  03/15/11- 1245- Donn Pierini RN, BSN [phone] Received notice from Christoper Allegra that pt already has hospital bed, order for gel overlay given to [facility] with [facility]. Per pt's wife they are working with someone to get pt a newer bed through an assistance program. CM to continue to follow for d/c needs. Will need HH-RN orders prior to discharge for I&O cath at home.  03/14/11 12:17 Letha Cape RN, BSN [phone] Patient is active with [facility]  per NCM note, I called Corrie Dandy with [facility] to see what services patient had previously.  I tried to talke to patient , but patient was not awake yet.  Spoke with mother [name] she states they will need a hospital bed and air mattress, her phone number is  [phone].  Mother states wife name is [name] , her home phone is [phone] and cell is [phone].  I called [facility] with [facility] and faxed orders for hospital bed and air mattress, she states they will coordinate with wife for delivery tomorrow.  3-5 act w hhc. alerted mary w [facility] who will follow along until disch. debbie dowell rn,bsn [phone]

## 2011-03-26 NOTE — Progress Notes (Signed)
Wife called unit and discharge instructions given by nurse via telephone. Discharge summary reviewed and wife capable of re verbalizing medications and follow up appointments. Terry Palmer E

## 2011-03-26 NOTE — Discharge Summary (Signed)
PATIENT DETAILS Name: Terry Palmer Age: 36 y.o. Sex: male Date of Birth: 21-Jul-1975 MRN: [id number]. Admit Date: 03/12/2011 Admitting Physician: Rodolph Bong, MD NWG:[id number],[id number], MD, MD  PRIMARY DISCHARGE DIAGNOSIS:  Principal Problem:  *Enterococcus UTI Active Problems:  Multiple sclerosis exacerbation  Weakness generalized  Encephalopathy  Hematuria  Quadriparesis (muscle weakness)  Malnutrition  Feeding difficulties      PAST MEDICAL HISTORY: Past Medical History  Diagnosis Date  . MS (multiple sclerosis)   . UTI (urinary tract infection)   . 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 Childhood asthma   . Depression   . Neuromuscular disorder     Quadraperesis    DISCHARGE MEDICATIONS: Medication List  As of 03/26/2011  1:10 PM   STOP taking these medications         baclofen 10 MG tablet      bisacodyl 5 MG EC tablet      cephALEXin 500 MG capsule      FLUoxetine 20 MG capsule      predniSONE 5 MG Tabs         TAKE these medications         acetaminophen 325 MG tablet   Commonly known as: TYLENOL   Take 2 tablets (650 mg total) by mouth every 6 (six) hours as needed (or Fever >/= 101).      amoxicillin-clavulanate 400-57 MG/5ML suspension   Commonly known as: AUGMENTIN   Take 10.9 mLs (875 mg total) by mouth every 12 (twelve) hours. Take  for 7 days      BETASERON 0.3 MG injection   Generic drug: interferon beta-1b   Inject 0.3 mg into the skin every other day. Next dose due 03/13/2011      buPROPion 150 MG 12 hr tablet   Commonly known as: WELLBUTRIN SR   Take 150 mg by mouth 2 (two) times daily.      cholecalciferol 1000 UNITS tablet   Commonly known as: VITAMIN D   Take 1,000 Units by mouth daily.      docusate sodium 100 MG capsule   Commonly known as: COLACE   Take 100 mg by mouth 2 (two) times daily.      feeding supplement Liqd   Take 237 mLs by mouth 3 (three) times daily with meals.      fish oil-omega-3 fatty acids  1000 MG capsule   Take 1,200 mg by mouth daily.      Garlic 1000 MG Caps   Take 1,000 mg by mouth daily.      ibuprofen 200 MG tablet   Commonly known as: ADVIL,MOTRIN   Take 200 mg by mouth every 6 (six) hours as needed. For pain and fever      methocarbamol 500 MG tablet   Commonly known as: ROBAXIN   Take 1 tablet (500 mg total) by mouth 3 (three) times daily.      nitrofurantoin (macrocrystal-monohydrate) 100 MG capsule   Commonly known as: MACROBID   Take 1 capsule (100 mg total) by mouth 2 (two) times daily.      polyethylene glycol packet   Commonly known as: MIRALAX / GLYCOLAX   Take 17 g by mouth daily.      senna 8.6 MG Tabs   Commonly known as: SENOKOT   Take 1 tablet (8.6 mg total) by mouth 2 (two) times daily. Hold for diarrhea.      vitamin B-12 1000 MCG tablet   Commonly known as: CYANOCOBALAMIN   Take  1,000 mcg by mouth daily.             BRIEF HPI:  See H&P, Labs, Consult and Test reports for all details in brief, patient was admitted for   CONSULTATIONS:  Palliative care, GI and general surgery  PERTINENT RADIOLOGIC STUDIES: Ct Abdomen Pelvis Wo Contrast  03/12/2011  *RADIOLOGY REPORT*  Clinical Data: Microscopic hematuria.  CT ABDOMEN AND PELVIS WITHOUT CONTRAST  Technique:  Multidetector CT imaging of the abdomen and pelvis was performed following the standard protocol without intravenous contrast.  Comparison: None.  Findings: Asymmetric elevation of the left hemidiaphragm noted.  No focal abnormalities seen in the liver or spleen on this study performed without intravenous contrast material.  The stomach, the duodenum, pancreas, gallbladder, and adrenal glands are unremarkable.  No stones are seen in either kidney.  There is no hydronephrosis. No secondary changes in either kidney.  No abdominal aortic aneurysm.  No free fluid or lymphadenopathy in the abdomen.  The abdominal bowel loops are unremarkable.  Imaging through the pelvis shows a prominent  stool volume in the rectal vault.  No intraperitoneal free fluid.  Multiple stones are seen in the bladder lumen, measuring up to 1.5 cm in diameter.  No pelvic sidewall lymphadenopathy.  There is some abnormal edema in the presacral soft tissues of indeterminate etiology.  The terminal ileum and the appendix are normal in appearance.  Bone windows reveal no worrisome lytic or sclerotic osseous lesions.  IMPRESSION: No renal or ureteral calculi.  No secondary changes in either kidney.  Multiple bladder stones are evident.  Original Report Authenticated By: ERIC A. MANSELL, M.D.   Dg Abd 1 View  03/23/2011  *RADIOLOGY REPORT*  Clinical Data: For gastrostomy tube placement.  ABDOMEN - 1 VIEW  Comparison: Images dated 03/22/2011  Findings: There is now contrast throughout the transverse portion of the colon to the level of the splenic flexure.  Minimal air in the nondistended bowel.  No osseous abnormality.  IMPRESSION: Contrast throughout the transverse colon.  Original Report Authenticated By: Gwynn Burly, M.D.   Ct Head Wo Contrast  03/12/2011  *RADIOLOGY REPORT*  Clinical Data: Mental status changes.  Not eating.  Speech disturbance.  History multiple sclerosis.  CT HEAD WITHOUT CONTRAST  Technique:  Contiguous axial images were obtained from the base of the skull through the vertex without contrast.  Comparison: MRI 10/20/2010  Findings: the brain shows generalized atrophy.  There is abnormal low density throughout the hemispheric white matter consistent with the patient's known chronic demyelinating disease.  This does not appear to have grossly changed.  No sign of acute infarction, mass lesion, hemorrhage, hydrocephalus or extra-axial collection. Sinuses, middle ears and mastoids are clear.  No skull or skull base lesion.  IMPRESSION: Chronic white matter lesions in brain atrophy consistent with chronic multiple sclerosis.  No sign of any identifiable acute process.  Original Report Authenticated By:  Thomasenia Sales, M.D.   Mr Laqueta Jean Wo Contrast  03/15/2011  *RADIOLOGY REPORT*  Clinical Data:  History of multiple sclerosis.  Decrease in mental status.  Please note present examination was performed over 2 days as the patient could not tolerate imaging one time.  MRI HEAD WITHOUT AND WITH CONTRAST MRI CERVICAL SPINE WITHOUT AND WITH CONTRAST  Technique:  Multiplanar, multiecho pulse sequences of the brain and surrounding structures, and cervical spine, to include the craniocervical junction and cervicothoracic junction, were obtained without and with intravenous contrast.  Contrast: ,10mL MULTIHANCE GADOBENATE DIMEGLUMINE 529  MG/ML IV SOLN  Comparison:  10/20/2010.  MRI HEAD  Findings:  No acute infarct.  Mild progression of the severe demyelinating disease with change most apparent in the posterior-superior periventricular region and periatrial region.  None of these areas demonstrates enhancement or restricted motion as can be seen with active areas of demyelination.  No intracranial mass.  No intracranial hemorrhage.  Global atrophy without hydrocephalus.  Major intracranial vascular structures are patent.  Minimal paranasal sinus mucosal thickening.  Cervical spondylotic changes as discussed below.  IMPRESSION: Mild progression of the severe demyelinating disease with change most apparent in the posterior-superior periventricular region and periatrial region.  None of these areas demonstrates enhancement or restricted motion as can be seen with active areas of demyelination.  MRI CERVICAL SPINE  Findings: Diffuse altered signal intensity throughout the cervical and thoracic spine consistent with demyelinating process.  It is difficult to make direct comparison to the most recent prior exam as the prior exam is motion degraded.  Grossly there appears been no significant change.  None of these areas demonstrates enhancement to suggest active demyelination.  C2-3:  Bulge/shallow protrusion.  C3-4:  Shallow  broad-based protrusion greater to the right with minimal cord flattening.  C4-5:  Minimal bulge.  Minimal Schmorl's node deformity.  C5-6:  Minimal to mild bulge.  C6-7:  Minimal Schmorl's node deformity.  Minimal bulge.  C7-T1:  Negative.  2 HU hemangioma unchanged.  IMPRESSION: Relatively similar appearance of demyelination involving the cervical and thoracic cord taking into account that the prior examination was motion degraded.  Similar appearance of disc disease most notable C3-4 level.  Please see above.  Original Report Authenticated By: Fuller Canada, M.D.   Mr Cervical Spine W Wo Contrast  03/15/2011  *RADIOLOGY REPORT*  Clinical Data:  History of multiple sclerosis.  Decrease in mental status.  Please note present examination was performed over 2 days as the patient could not tolerate imaging one time.  MRI HEAD WITHOUT AND WITH CONTRAST MRI CERVICAL SPINE WITHOUT AND WITH CONTRAST  Technique:  Multiplanar, multiecho pulse sequences of the brain and surrounding structures, and cervical spine, to include the craniocervical junction and cervicothoracic junction, were obtained without and with intravenous contrast.  Contrast: ,10mL MULTIHANCE GADOBENATE DIMEGLUMINE 529 MG/ML IV SOLN  Comparison:  10/20/2010.  MRI HEAD  Findings:  No acute infarct.  Mild progression of the severe demyelinating disease with change most apparent in the posterior-superior periventricular region and periatrial region.  None of these areas demonstrates enhancement or restricted motion as can be seen with active areas of demyelination.  No intracranial mass.  No intracranial hemorrhage.  Global atrophy without hydrocephalus.  Major intracranial vascular structures are patent.  Minimal paranasal sinus mucosal thickening.  Cervical spondylotic changes as discussed below.  IMPRESSION: Mild progression of the severe demyelinating disease with change most apparent in the posterior-superior periventricular region and periatrial  region.  None of these areas demonstrates enhancement or restricted motion as can be seen with active areas of demyelination.  MRI CERVICAL SPINE  Findings: Diffuse altered signal intensity throughout the cervical and thoracic spine consistent with demyelinating process.  It is difficult to make direct comparison to the most recent prior exam as the prior exam is motion degraded.  Grossly there appears been no significant change.  None of these areas demonstrates enhancement to suggest active demyelination.  C2-3:  Bulge/shallow protrusion.  C3-4:  Shallow broad-based protrusion greater to the right with minimal cord flattening.  C4-5:  Minimal bulge.  Minimal Schmorl's node deformity.  C5-6:  Minimal to mild bulge.  C6-7:  Minimal Schmorl's node deformity.  Minimal bulge.  C7-T1:  Negative.  2 HU hemangioma unchanged.  IMPRESSION: Relatively similar appearance of demyelination involving the cervical and thoracic cord taking into account that the prior examination was motion degraded.  Similar appearance of disc disease most notable C3-4 level.  Please see above.  Original Report Authenticated By: Fuller Canada, M.D.   Ir Fluoro Rm 30-60 Min  03/23/2011  *RADIOLOGY REPORT*  Clinical Data: Advanced multiple sclerosis, quadriparesis, malnutrition  IR FLOURO RM 0-60 MIN  Comparison: 03/12/2011 CT abdomen  Findings: In preparation for percutaneous fluoroscopic gastrostomy, the patient was administered barium the night before.  Under fluoroscopy the barium is within the transverse colon.  Next, a 5- [place] oral gastric catheter was advanced under fluoroscopy into the stomach for insufflation with air.  The stomach was distended with air.  Fluoroscopy exam was performed of the abdomen.  The air distended stomach is high in the left upper quadrant beneath the left subcostal margin.  Even with adequate stomach distention, the stomach is well beneath the left subcostal margin in both the frontal and lateral projections.   Therefore percutaneous needle access of the stomach cannot be performed safely with fluoroscopy without risk of bowel injury or placing the tube too close to the rib margin.  Therefore the gastrostomy tube placement was not performed.  Findings discussed with Dr. Rhea Belton.  Recommend surgical consultation for gastrostomy placement  IMPRESSION: Unable to place percutaneous gastrostomy safely under fluoroscopy because of a high positioned stomach in the left upper quadrant beneath the subcostal margin as described  Original Report Authenticated By: Judie Petit. Ruel Favors, M.D.   Ir Fluoro Rm 30-60 Min  03/22/2011  *RADIOLOGY REPORT*  Clinical Data: Multiple sclerosis and dysphagia.  The patient has been referred for gastrostomy tube placement.  IR FLOURO RM 0-60 MIN  Comparison: CT of the abdomen dated 03/12/2011  The patient was brought down for planned gastrostomy tube placement and had received some barium by mouth yesterday.  Initial fluoroscopy was performed and images saved.  There was attempt made to place a 5-French catheter through the mouth and into the stomach under fluoroscopy.  This attempt was aborted before successful advancement.  Findings:  Initial fluoroscopy shows barium in the proximal colon and up to approximately the mid transverse colon level.  The stomach is located in a high position with adjacent high splenic flexure noted.  In addition, multiple loops of air-filled small bowel are present throughout the abdomen.  Attempt was made to place a 5-French orogastric catheter to try to insufflate the stomach under fluoroscopy to determine whether a gastrostomy tube could be placed safely.  The patient tolerated the attempt very poorly due to gag reflex and increased secretions.  It was clear that the patient would not tolerate further attempt at orogastric catheter advancement without significant administration of conscious sedation.  Given radiographic findings, it was felt not safe to proceed today  with attempted gastrostomy tube placement.  An abdominal film will be obtained tomorrow morning to reassess advancement of barium in the colon and whether to attempt orogastric catheter placement after administration of IV conscious sedation to try to insufflate the stomach safely for gastrostomy tube placement.  IMPRESSION: Gastrostomy tube placement aborted today due to findings above.  An abdominal film will be obtained in the morning to reassess bowel gas pattern and transit of barium.  Original Report Authenticated By: Sherrine Maples  T. Fredia Sorrow, M.D.   Dg Chest Port 1 View  03/23/2011  *RADIOLOGY REPORT*  Clinical Data: Shortness of breath.  PORTABLE CHEST - 1 VIEW  Comparison: Portable chest 03/18/2011.  Findings: The heart size is normal. Scarring in the left upper lobe is stable.  The lungs are otherwise clear.  The visualized soft tissues and bony thorax are unremarkable.  IMPRESSION: No acute cardiopulmonary disease or significant interval change.  Original Report Authenticated By: Jamesetta Orleans. MATTERN, M.D.   Dg Chest Port 1 View  03/18/2011  *RADIOLOGY REPORT*  Clinical Data: Aspiration.  PORTABLE CHEST - 1 VIEW  Comparison: Chest x-ray 03/13/2011.  Findings: Lung volumes are low.  No consolidative airspace disease. No pleural effusions.  Pulmonary vasculature and the cardiomediastinal silhouette are within normal limits.  IMPRESSION:  1.  No radiographic evidence of acute cardiopulmonary disease.  Low lung volumes.  Original Report Authenticated By: Florencia Reasons, M.D.   Dg Chest Port 1 View  03/13/2011  *RADIOLOGY REPORT*  Clinical Data: Pneumonia.  PORTABLE CHEST - 1 VIEW  Comparison: 03/12/2011.  Findings: The cardiac silhouette, mediastinal and hilar contours are within normal limits and stable.  Stable mild eventration of the left hemidiaphragm with mild overlying atelectasis.  Low lung volumes with mild vascular crowding but no infiltrates, edema or effusions.  IMPRESSION: No significant  pulmonary findings.  Original Report Authenticated By: P. Loralie Champagne, M.D.   Dg Chest Portable 1 View  03/12/2011  *RADIOLOGY REPORT*  Clinical Data: Multiple sclerosis.  Failure to thrive.  PORTABLE CHEST - 1 VIEW  Comparison: 12/27/2010  Findings: 1715 hours.  Lung volumes are low. The lungs are clear without focal infiltrate, edema, pneumothorax or pleural effusion. The cardiopericardial silhouette is within normal limits for size. Imaged bony structures of the thorax are intact. Telemetry leads overlie the chest.  IMPRESSION: Low volume film without acute findings.  Original Report Authenticated By: ERIC A. MANSELL, M.D.     PERTINENT LAB RESULTS: CBC:  Basename 03/24/11 0710  WBC 5.8  HGB 11.6*  HCT 33.9*  PLT 451*   CMET CMP     Component Value Date/Time   NA 135 03/24/2011 0710   K 3.9 03/24/2011 0710   CL 101 03/24/2011 0710   CO2 22 03/24/2011 0710   GLUCOSE 96 03/24/2011 0710   BUN <3* 03/24/2011 0710   CREATININE 0.62 03/24/2011 0710   CALCIUM 9.2 03/24/2011 0710   PROT 6.5 03/12/2011 2054   ALBUMIN 3.1* 03/12/2011 2054   AST 23 03/12/2011 2054   ALT 18 03/12/2011 2054   ALKPHOS 98 03/12/2011 2054   BILITOT 0.4 03/12/2011 2054   GFRNONAA >90 03/24/2011 0710   GFRAA >90 03/24/2011 0710    GFR Estimated Creatinine Clearance: 151.6 ml/min (by C-G formula based on Cr of 0.62). No results found for this basename: LIPASE:2,AMYLASE:2 in the last 72 hours No results found for this basename: CKTOTAL:3,CKMB:3,CKMBINDEX:3,TROPONINI:3 in the last 72 hours No components found with this basename: POCBNP:3 No results found for this basename: DDIMER:2 in the last 72 hours No results found for this basename: HGBA1C:2 in the last 72 hours No results found for this basename: CHOL:2,HDL:2,LDLCALC:2,TRIG:2,CHOLHDL:2,LDLDIRECT:2 in the last 72 hours No results found for this basename: TSH,T4TOTAL,FREET3,T3FREE,THYROIDAB in the last 72 hours No results found for this basename:  VITAMINB12:2,FOLATE:2,FERRITIN:2,TIBC:2,IRON:2,RETICCTPCT:2 in the last 72 hours Coags: No results found for this basename: PT:2,INR:2 in the last 72 hours Microbiology: Recent Results (from the past 240 hour(s))  URINE CULTURE     Status:  Normal   Collection Time   03/18/11  8:26 AM      Component Value Range Status Comment   Specimen Description URINE, RANDOM   Final    Special Requests NONE   Final    Culture  Setup Time [id number]   Final    Colony Count >=100,000 COLONIES/ML   Final    Culture ENTEROCOCCUS SPECIES   Final    Report Status 03/20/2011 FINAL   Final    Organism ID, Bacteria ENTEROCOCCUS SPECIES   Final   CULTURE, BLOOD (ROUTINE X 2)     Status: Normal   Collection Time   03/18/11 11:20 AM      Component Value Range Status Comment   Specimen Description BLOOD RIGHT ARM   Final    Special Requests BOTTLES DRAWN AEROBIC AND ANAEROBIC 10CC   Final    Culture  Setup Time [id number]   Final    Culture NO GROWTH 5 DAYS   Final    Report Status 03/24/2011 FINAL   Final   CULTURE, BLOOD (ROUTINE X 2)     Status: Normal   Collection Time   03/18/11 11:25 AM      Component Value Range Status Comment   Specimen Description BLOOD RIGHT HAND   Final    Special Requests BOTTLES DRAWN AEROBIC AND ANAEROBIC 10CC   Final    Culture  Setup Time [id number]   Final    Culture NO GROWTH 5 DAYS   Final    Report Status 03/24/2011 FINAL   Final      BRIEF HOSPITAL COURSE:   Principal Problem: Enterococcus UTI   the patient was admitted on March 12, 2011. Initially we suspected he may have a urinary tract infection but there are surprise the initial urine culture from March 12, 2011 did not indicate any growth. On March 17, 2011 we noticed that he was having low-grade fever. By March 18, 2011 temperature was up to 101 and the patient was markedly worse. Blood cultures urine cultures and chest x-ray were done on that date. The patient was started on empiric vancomycin Levaquin and  cefepime. He continued to remain febrile. On March 19, 2011 the urine culture results were available indicated enterococcus. On that date we have switched antibiotics to vancomycin and Zosyn. Final urine culture results were available on March 20, 2011 and the patient is currently on Unasyn. Obviously the presence of bladder stones is making this infection extremity difficult to eradicate. Patient will be transitioned to Augmentin on discharge, he will then after he finishes Augmentin he will go back on Macrobid for suppressive therapy   Encephalopathy - Initially patient was admitted for supposedly altered mental status. 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 The patient can communicate a little, is bed bound, has contractures, has extreme difficulty with swallowing, cannot chew. When he becomes febrile and there is an infectious complication obviously the encephalopathy is getting worse. Currently mental status is back to baseline  hematuria  CT showing bladder stones  Urology consulted, recommended I and O cath but the patient's wife says she will be unable to do this. .  Patient was extremely uncomfortable with In and Out cath- moaning and complaining so we placed an indwelling Foley catheter for now-this will need to be changed monthly. 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 Anne Hahn, patient's primary neurologist - he recommended peg tube- not able to place peg tube by GI or IR. Family did speak with [facility] regarding open peg placement, then subsequently refused to take risk of general anesthesia The patient was on Tysabri for 3 years and there is concern that he may have PML - but MRI does not  indicate such  MRI resulted on 03/15/11 - Shows no active MS lesions - will continue betaseron on discharge  Dysphagia -Initially plans were to place a Peg Tube, but  Both GI and IR could not plave given patient's anatomy-stomach high up, we did consult [facility], family briefly discussed with [facility] but then decided to continue with Dysphagia 1 diet, they did not want to take risk of general anesthesia, patient seems to be tolerating Dysphagia 1 diet, family is well aware of the risks of Aspiration and are accepting it.   Palliative Care -Patient had multiple discussions with the palliative care team, at one point the family and the patient chose DO NOT RESUSCITATE status, however on the day of discharge date again decided to reverse DO NOT RESUSCITATE status and outpatient is back to being a full code. However the family and patient regularly advised that this patient has end-stage/advanced multiple sclerosis, remains at risk for recurrent UTIs and aspiration pneumonia. He also is at risk for DVTs and sacral decubitus ulcers. At this point plans are to discharge him back home with home health services.  TODAY-DAY OF DISCHARGE:  Subjective:   Terry Palmer today has no headache,no chest abdominal pain,no new weakness tingling or numbness, feels that his usual baseline and family is anxious to take him back home.  Objective:   Blood pressure 103/68, pulse 96, temperature 98.3 F (36.8 C), temperature source Oral, resp. rate 18, height 6\' 4"  (1.93 m), weight 84.8 kg (186 lb 15.2 oz), SpO2 97.00%.  Intake/Output Summary (Last 24 hours) at 03/26/11 1310 Last data filed at 03/26/11 0939  Gross per 24 hour  Intake 2234.5 ml  Output   2150 ml  Net   84.5 ml    Exam Awake Alert, Oriented *3, No new F.N deficits, Normal affect [place].AT,PERRAL Supple Neck,No JVD, No cervical lymphadenopathy appriciated.  Symmetrical Chest wall movement, Good air movement bilaterally, CTAB RRR,No Gallops,Rubs or new  Murmurs, No Parasternal Heave +ve B.Sounds, Abd Soft, Non tender, No organomegaly appriciated, No rebound -guarding or rigidity. No Cyanosis, Clubbing or edema, No new Rash or bruise  DISPOSITION: Home with Home health services   DISCHARGE INSTRUCTIONS:    Follow-up Information    Follow up with Florentina Jenny, MD. (2 weeks)    Contact information:   [address] [place]. [address] [place] [address] [phone]         Total Time spent on discharge equals 45 minutes.  SignedJeoffrey Massed 03/26/2011 1:10 PM

## 2011-04-02 ENCOUNTER — Encounter (HOSPITAL_COMMUNITY): Payer: Self-pay | Admitting: Internal Medicine

## 2011-04-10 ENCOUNTER — Other Ambulatory Visit: Payer: Self-pay

## 2011-04-10 ENCOUNTER — Inpatient Hospital Stay (HOSPITAL_COMMUNITY)
Admission: EM | Admit: 2011-04-10 | Discharge: 2011-04-19 | Disposition: A | Discharge status: Home Health | Payer: Medicare HMO | Source: Home / Self Care | Attending: Internal Medicine | Admitting: Internal Medicine

## 2011-04-10 ENCOUNTER — Emergency Department (HOSPITAL_COMMUNITY): Payer: Medicare HMO

## 2011-04-10 ENCOUNTER — Encounter (HOSPITAL_COMMUNITY): Payer: Self-pay | Admitting: Cardiology

## 2011-04-10 DIAGNOSIS — R1310 Dysphagia, unspecified: Secondary | ICD-10-CM | POA: Diagnosis present

## 2011-04-10 DIAGNOSIS — IMO0002 Reserved for concepts with insufficient information to code with codable children: Secondary | ICD-10-CM

## 2011-04-10 DIAGNOSIS — E860 Dehydration: Secondary | ICD-10-CM | POA: Diagnosis present

## 2011-04-10 DIAGNOSIS — N390 Urinary tract infection, site not specified: Secondary | ICD-10-CM | POA: Diagnosis present

## 2011-04-10 DIAGNOSIS — K5900 Constipation, unspecified: Secondary | ICD-10-CM | POA: Diagnosis present

## 2011-04-10 DIAGNOSIS — G9340 Encephalopathy, unspecified: Secondary | ICD-10-CM | POA: Diagnosis present

## 2011-04-10 DIAGNOSIS — R633 Feeding difficulties, unspecified: Secondary | ICD-10-CM | POA: Diagnosis present

## 2011-04-10 DIAGNOSIS — R197 Diarrhea, unspecified: Secondary | ICD-10-CM | POA: Diagnosis present

## 2011-04-10 DIAGNOSIS — G35 Multiple sclerosis: Secondary | ICD-10-CM | POA: Diagnosis present

## 2011-04-10 DIAGNOSIS — R531 Weakness: Secondary | ICD-10-CM | POA: Diagnosis present

## 2011-04-10 DIAGNOSIS — M6281 Muscle weakness (generalized): Secondary | ICD-10-CM | POA: Diagnosis present

## 2011-04-10 DIAGNOSIS — L8992 Pressure ulcer of unspecified site, stage 2: Secondary | ICD-10-CM | POA: Diagnosis present

## 2011-04-10 DIAGNOSIS — A419 Sepsis, unspecified organism: Principal | ICD-10-CM | POA: Diagnosis present

## 2011-04-10 DIAGNOSIS — Z7401 Bed confinement status: Secondary | ICD-10-CM

## 2011-04-10 DIAGNOSIS — N210 Calculus in bladder: Secondary | ICD-10-CM | POA: Diagnosis present

## 2011-04-10 DIAGNOSIS — K668 Other specified disorders of peritoneum: Secondary | ICD-10-CM | POA: Diagnosis present

## 2011-04-10 DIAGNOSIS — E46 Unspecified protein-calorie malnutrition: Secondary | ICD-10-CM | POA: Diagnosis present

## 2011-04-10 DIAGNOSIS — E876 Hypokalemia: Secondary | ICD-10-CM | POA: Diagnosis present

## 2011-04-10 DIAGNOSIS — G8929 Other chronic pain: Secondary | ICD-10-CM | POA: Diagnosis present

## 2011-04-10 DIAGNOSIS — IMO0001 Reserved for inherently not codable concepts without codable children: Secondary | ICD-10-CM | POA: Diagnosis present

## 2011-04-10 DIAGNOSIS — R000 Tachycardia, unspecified: Secondary | ICD-10-CM | POA: Diagnosis present

## 2011-04-10 DIAGNOSIS — J45909 Unspecified asthma, uncomplicated: Secondary | ICD-10-CM | POA: Diagnosis present

## 2011-04-10 DIAGNOSIS — R627 Adult failure to thrive: Secondary | ICD-10-CM | POA: Diagnosis present

## 2011-04-10 DIAGNOSIS — G8250 Quadriplegia, unspecified: Secondary | ICD-10-CM | POA: Diagnosis present

## 2011-04-10 DIAGNOSIS — J690 Pneumonitis due to inhalation of food and vomit: Secondary | ICD-10-CM | POA: Diagnosis present

## 2011-04-10 DIAGNOSIS — R319 Hematuria, unspecified: Secondary | ICD-10-CM | POA: Diagnosis present

## 2011-04-10 DIAGNOSIS — B965 Pseudomonas (aeruginosa) (mallei) (pseudomallei) as the cause of diseases classified elsewhere: Secondary | ICD-10-CM | POA: Diagnosis present

## 2011-04-10 DIAGNOSIS — L89109 Pressure ulcer of unspecified part of back, unspecified stage: Secondary | ICD-10-CM | POA: Diagnosis present

## 2011-04-10 LAB — URINALYSIS, ROUTINE W REFLEX MICROSCOPIC
Glucose, UA: NEGATIVE mg/dL
Protein, ur: 30 mg/dL — AB

## 2011-04-10 LAB — BASIC METABOLIC PANEL
BUN: 12 mg/dL (ref 6–23)
CO2: 25 mEq/L (ref 19–32)
Calcium: 9.3 mg/dL (ref 8.4–10.5)
Creatinine, Ser: 0.93 mg/dL (ref 0.50–1.35)
Glucose, Bld: 102 mg/dL — ABNORMAL HIGH (ref 70–99)
Sodium: 141 mEq/L (ref 135–145)

## 2011-04-10 LAB — DIFFERENTIAL
Eosinophils Relative: 1 % (ref 0–5)
Lymphocytes Relative: 14 % (ref 12–46)
Lymphs Abs: 1.7 10*3/uL (ref 0.7–4.0)
Monocytes Absolute: 0.9 10*3/uL (ref 0.1–1.0)
Monocytes Relative: 7 % (ref 3–12)

## 2011-04-10 LAB — CREATININE, SERUM
Creatinine, Ser: 0.75 mg/dL (ref 0.50–1.35)
GFR calc Af Amer: >90 mL/min (ref >90)
GFR calc non Af Amer: >90 mL/min (ref >90)

## 2011-04-10 LAB — CBC
HCT: 41.5 % (ref 39.0–52.0)
Hemoglobin: 11.8 g/dL — ABNORMAL LOW (ref 13.0–17.0)
MCV: 84.7 fL (ref 78.0–100.0)
RBC: 4.9 MIL/uL (ref 4.22–5.81)
RBC: 4.15 MIL/uL — ABNORMAL LOW (ref 4.22–5.81)
WBC: 12.6 10*3/uL — ABNORMAL HIGH (ref 4.0–10.5)

## 2011-04-10 LAB — URINE MICROSCOPIC-ADD ON

## 2011-04-10 MED ORDER — BACLOFEN 10 MG PO TABS
10.0000 mg | ORAL_TABLET | Freq: Three times a day (TID) | ORAL | Status: DC
  Administered 2011-04-10 – 2011-04-19 (×20): 10 mg via ORAL
  Filled 2011-04-10 (×28): qty 1

## 2011-04-10 MED ORDER — SODIUM CHLORIDE 0.9 % IV BOLUS (SEPSIS)
1000.0000 mL | Freq: Once | INTRAVENOUS | Status: AC
  Administered 2011-04-10: 1000 mL via INTRAVENOUS

## 2011-04-10 MED ORDER — CIPROFLOXACIN IN D5W 200 MG/100ML IV SOLN
200.0000 mg | Freq: Two times a day (BID) | INTRAVENOUS | Status: DC
  Administered 2011-04-10 – 2011-04-11 (×2): 200 mg via INTRAVENOUS
  Filled 2011-04-10 (×3): qty 100

## 2011-04-10 MED ORDER — IBUPROFEN 200 MG PO TABS
200.0000 mg | ORAL_TABLET | Freq: Once | ORAL | Status: AC
Start: 2011-04-10 — End: 2011-04-10
  Administered 2011-04-10: 200 mg via ORAL
  Filled 2011-04-10: qty 1

## 2011-04-10 MED ORDER — VITAMIN D3 25 MCG (1000 UNIT) PO TABS
1000.0000 [IU] | ORAL_TABLET | Freq: Two times a day (BID) | ORAL | Status: DC
  Administered 2011-04-10 – 2011-04-19 (×14): 1000 [IU] via ORAL
  Filled 2011-04-10 (×19): qty 1

## 2011-04-10 MED ORDER — ENSURE PUDDING PO PUDG
1.0000 | Freq: Three times a day (TID) | ORAL | Status: DC
  Administered 2011-04-10 – 2011-04-16 (×12): 1 via ORAL
  Filled 2011-04-10: qty 1

## 2011-04-10 MED ORDER — OMEGA-3-ACID ETHYL ESTERS 1 G PO CAPS
1.0000 g | ORAL_CAPSULE | Freq: Every day | ORAL | Status: DC
  Administered 2011-04-13 – 2011-04-19 (×4): 1 g via ORAL
  Filled 2011-04-10 (×9): qty 1

## 2011-04-10 MED ORDER — SODIUM CHLORIDE 0.9 % IJ SOLN
3.0000 mL | Freq: Two times a day (BID) | INTRAMUSCULAR | Status: DC
  Administered 2011-04-11 – 2011-04-17 (×6): 3 mL via INTRAVENOUS

## 2011-04-10 MED ORDER — STARCH (THICKENING) PO POWD
ORAL | Status: DC | PRN
  Filled 2011-04-10: qty 227

## 2011-04-10 MED ORDER — SENNA 8.6 MG PO TABS
2.0000 | ORAL_TABLET | Freq: Every day | ORAL | Status: DC
  Administered 2011-04-13 – 2011-04-19 (×7): 17.2 mg via ORAL
  Filled 2011-04-10 (×9): qty 2

## 2011-04-10 MED ORDER — INTERFERON BETA-1B 0.3 MG ~~LOC~~ SOLR
0.3000 mg | SUBCUTANEOUS | Status: DC
  Administered 2011-04-12 – 2011-04-18 (×3): 0.3 mg via SUBCUTANEOUS
  Filled 2011-04-10: qty 0.3

## 2011-04-10 MED ORDER — DEXTROSE-NACL 5-0.9 % IV SOLN
INTRAVENOUS | Status: DC
  Administered 2011-04-10 – 2011-04-11 (×3): via INTRAVENOUS
  Administered 2011-04-12: 100 mL via INTRAVENOUS

## 2011-04-10 MED ORDER — ENOXAPARIN SODIUM 40 MG/0.4ML ~~LOC~~ SOLN
40.0000 mg | SUBCUTANEOUS | Status: DC
  Administered 2011-04-10 – 2011-04-15 (×6): 40 mg via SUBCUTANEOUS
  Filled 2011-04-10 (×7): qty 0.4

## 2011-04-10 NOTE — ED Notes (Signed)
Pt to department via EMs from home- wife reports that pt has had a decreased intake over the past couple of days. Reports that he is not acting his baseline. Pt is non-verbal per baseline. Bp- 120/86 Hr-112 CBG-101 20g LAC with 500cc NS.

## 2011-04-10 NOTE — ED Notes (Signed)
[id number] Ready 

## 2011-04-10 NOTE — Progress Notes (Signed)
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 Hx obtained from mother and his wife. Patient was recently discharged 3 weeks back with hospitalized for AMS with enterococcal UTI . At that time it was thought he also has advanced MS and a palliative care consult was called.  He was DNR but family was not interested in hospice. They also revoked DNR upon discharge. patient was seen by GI and IR for peg tube and given his complex anatomy recommended open surgery for PEG placement and family refused it as well.  His baseline mental status is mainly nodding head to questions and he was not responding much since yesterday and lethargic as well. Wife noticed him to have temp of 99.4. Denies chills, N,V , SOB or diarrhea. On my evaluation in the ED family felt patient was more interactive. In the ED,  Patient was tachycardic to 120s and abs done shows mild leucocytosis with UA showing leucocytes with few bacteria. His foley was changed yesterday.    Allergies: No Known Allergies  Prior to Admission medications   Medication Sig Start Date End Date Taking? Authorizing Provider  baclofen (LIORESAL) 20 MG tablet Take 10 mg by mouth 3 (three) times daily.   Yes Historical Provider, MD  cholecalciferol (VITAMIN D) 1000 UNITS tablet Take 1,000 Units by mouth 2 (two) times daily.    Yes Historical Provider, MD  Garlic 1000 MG CAPS Take 1,000 mg by mouth daily.     Yes Historical Provider, MD  interferon beta-1b (BETASERON) 0.3 MG injection Inject 0.3 mg into the skin every other day. Next dose due 04/10/2011   Yes Historical Provider, MD  nitrofurantoin (MACRODANTIN)  100 MG capsule Take 100 mg by mouth 2 (two) times daily.   Yes Historical Provider, MD  omega-3 acid ethyl esters (LOVAZA) 1 G capsule Take 1 g by mouth daily.   Yes Historical Provider, MD  senna (SENOKOT) 8.6 MG TABS Take 2 tablets by mouth daily.   Yes Historical Provider, MD    Past Medical History  Diagnosis Date  . MS (multiple sclerosis)   . UTI (urinary tract infection)   . 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 Childhood asthma   . Depression   . Neuromuscular disorder     Quadraperesis    Past Surgical History  Procedure Date  . Lumbar puncture 10/12/2002  . Peg placement 03/21/2011    Procedure: PERCUTANEOUS ENDOSCOPIC GASTROSTOMY (PEG) PLACEMENT;  Surgeon: Beverley Fiedler, MD;  Location: [facility] ENDOSCOPY;  Service: Gastroenterology;  Laterality: N/A;  sara /ja new peg    Social History:  reports that he quit smoking about 12 years ago. His smoking use included Cigarettes. He has quit using smokeless tobacco. He reports that he uses illicit drugs (Marijuana and Cocaine). He reports that he does not drink alcohol.  Family History  Problem Relation Age of Onset  . Asthma Mother     Review of Systems:  Constitutional: low grade fever, no  chills, diaphoresis, poor appetite HEENT: Denies photophobia, eye pain, redness, hearing loss, ear pain, congestion, sore throat, rhinorrhea, sneezing, mouth sores, trouble swallowing, neck pain, neck stiffness and  tinnitus.   Respiratory: Denies SOB, DOE, cough, chest tightness,  and wheezing.   Cardiovascular: Denies chest pain, palpitations and leg swelling.  Gastrointestinal: Denies nausea, vomiting, abdominal pain, diarrhea, constipation, blood in stool and abdominal distention.  Genitourinary: Denies dysuria, urgency, frequency, hematuria, flank pain and difficulty urinating.  Musculoskeletal: Denies myalgias, back pain, joint swelling, arthralgias and gait problem.  Skin: Denies pallor, rash and wound.  Neurological: positive for  lethargy, Denies dizziness, seizures, syncope, weakness, light-headedness, numbness and headaches.  Hematological: Denies adenopathy. Easy bruising, personal or family bleeding history  Psychiatric/Behavioral: Denies suicidal ideation, mood changes, confusion, nervousness, sleep disturbance and agitation   Physical Exam:  Filed Vitals:   04/10/11 1627 04/10/11 1708 04/10/11 1818  BP: 129/88 117/82 119/80  Pulse: 120 113 100  Temp: 98.2 F (36.8 C)    TempSrc: Oral    Resp: 18 18 16   SpO2: 98% 98% 98%   General: Well-developed thin built young male in no acute distress. Alert with some garbled speech.  HEENT: Normocephalic atraumatic. Pupils equal round and reactive to light and accommodation.dry oral mucosa. Neck is supple with no lymphadenopathy. No bruits, no goiter.  Heart: Regular rate and rhythm, without murmurs, rubs, gallops.  Lungs: Clear to auscultation bilaterally.  Abdomen: Soft, nontender, nondistended, positive bowel sounds.  Extremities: No clubbing cyanosis or edema with positive pedal pulses.  Neuro: Patient is alert with eye movement and some nodding on questioning,  quadriparesis with some mild contractures of the lower extremities.Unable to perform rest of the neurological exam.       Labs on Admission:  Results for orders placed during the hospital encounter of 04/10/11 (from the past 48 hour(s))  CBC     Status: Abnormal   Collection Time   04/10/11  5:00 PM      Component Value Range Comment   WBC 12.6 (*) 4.0 - 10.5 (K/uL)    RBC 4.90  4.22 - 5.81 (MIL/uL)    Hemoglobin 14.3  13.0 - 17.0 (g/dL)    HCT 96.0  45.4 - 09.8 (%)    MCV 84.7  78.0 - 100.0 (fL)    MCH 29.2  26.0 - 34.0 (pg)    MCHC 34.5  30.0 - 36.0 (g/dL)    RDW 11.9  14.7 - 82.9 (%)    Platelets 338  150 - 400 (K/uL)   DIFFERENTIAL     Status: Abnormal   Collection Time   04/10/11  5:00 PM      Component Value Range Comment   Neutrophils Relative 78 (*) 43 - 77 (%)    Neutro Abs 9.8 (*)  1.7 - 7.7 (K/uL)    Lymphocytes Relative 14  12 - 46 (%)    Lymphs Abs 1.7  0.7 - 4.0 (K/uL)    Monocytes Relative 7  3 - 12 (%)    Monocytes Absolute 0.9  0.1 - 1.0 (K/uL)    Eosinophils Relative 1  0 - 5 (%)    Eosinophils Absolute 0.1  0.0 - 0.7 (K/uL)    Basophils Relative 0  0 - 1 (%)    Basophils Absolute 0.0  0.0 - 0.1 (K/uL)   BASIC METABOLIC PANEL     Status: Abnormal   Collection Time   04/10/11  5:00 PM      Component Value Range Comment   Sodium 141  135 - 145 (mEq/L)    Potassium 4.1  3.5 - 5.1 (mEq/L)    Chloride 104  96 - 112 (  mEq/L)    CO2 25  19 - 32 (mEq/L)    Glucose, Bld 102 (*) 70 - 99 (mg/dL)    BUN 12  6 - 23 (mg/dL)    Creatinine, Ser 1.61  0.50 - 1.35 (mg/dL)    Calcium 9.3  8.4 - 10.5 (mg/dL)    GFR calc non Af Amer >90  >90 (mL/min)    GFR calc Af Amer >90  >90 (mL/min)   URINALYSIS, ROUTINE W REFLEX MICROSCOPIC     Status: Abnormal   Collection Time   04/10/11  5:23 PM      Component Value Range Comment   Color, Urine AMBER (*) YELLOW  BIOCHEMICALS MAY BE AFFECTED BY COLOR   APPearance TURBID (*) CLEAR     Specific Gravity, Urine 1.029  1.005 - 1.030     pH 5.5  5.0 - 8.0     Glucose, UA NEGATIVE  NEGATIVE (mg/dL)    Hgb urine dipstick MODERATE (*) NEGATIVE     Bilirubin Urine SMALL (*) NEGATIVE     Ketones, ur 15 (*) NEGATIVE (mg/dL)    Protein, ur 30 (*) NEGATIVE (mg/dL)    Urobilinogen, UA 0.2  0.0 - 1.0 (mg/dL)    Nitrite NEGATIVE  NEGATIVE     Leukocytes, UA LARGE (*) NEGATIVE    URINE MICROSCOPIC-ADD ON     Status: Abnormal   Collection Time   04/10/11  5:23 PM      Component Value Range Comment   Squamous Epithelial / LPF FEW (*) RARE     WBC, UA TOO NUMEROUS TO COUNT  <3 (WBC/hpf)    RBC / HPF 0-2  <3 (RBC/hpf)    Bacteria, UA FEW (*) RARE     Crystals CA OXALATE CRYSTALS (*) NEGATIVE     Urine-Other MANY YEAST   MUCOUS PRESENT    Radiological Exams on Admission: No results found.  Assessment/Plan  #1 encephalopathy  Possibly  related to? UTI again and with poor appetite and dehydration and perhaps advanced MS. patient had similar presentation recently. appears quite dehydrated with tachycardia  monitor in tele  Patient is currently alert and improving as per family. Patient has mild  Leukocytosis and UA does show signs of UTI . Patient has hx of recurrent UTI and  chronically on microbid. Will treat for UTI with IV cipro and follow urine cx..   Will hydrate with IV fluids. Monitor improvement neurological fn Will try dysphagia level 1 diet with meds crushed in puree which he's on at home.    multiple sclerosis/quadriparesis   continue home regimen of baclofen prednisone.   DVT prophylaxis: sq lovenox   Code status:  Full code  Goals of care: Please refer to Dr Windell Norfolk discharge summary from march.  Patient was evaluated for PEG but due to complexity recommenced for surgical PEG placement but family refused due to risks involved. Also revoked DNR prior to discharge. Discussed these issues with famiyl again . They want to think overnight and might be able to decide tomorrow depending upon how patient progresses. Also refused hospice after discussing with palliative care during last admission.    Time Spent on Admission: 55 minutes  Kristell Wooding 04/10/2011, 7:27 PM

## 2011-04-10 NOTE — ED Notes (Signed)
EKG given to Dr. Radford Pax.  Copies placed in chart.

## 2011-04-10 NOTE — ED Notes (Signed)
Dr. Gonzella Lex was in to see the patient

## 2011-04-10 NOTE — ED Notes (Signed)
Patient is scheduled to receive his Interferon today to be medicated with Ibuprofen prior to his injection. Dr. Jamas Lav was made aware and informed RN that it is okay for the patient's family to administer it.

## 2011-04-10 NOTE — ED Provider Notes (Signed)
History     CSN: [id number]  Arrival date & time 04/10/11  1619   First MD Initiated Contact with Patient 04/10/11 1635      Chief Complaint  Patient presents with  . Altered Mental Status    (Consider location/radiation/quality/duration/timing/severity/associated sxs/prior treatment) HPI Pt here with decreased oral intake, less responsive today.  Has h/o chronic utis with indwelling foley. decreased intake and weight loss recently 2/2 dysphagia and poor effort.  Pt's wife and mother had been in talks recently with physicians about placing g tube but pt did not want this at that time.  Sx's moderate, no aggravating or alleviating factors.  AMS started today.    Past Medical History  Diagnosis Date  . MS (multiple sclerosis)   . UTI (urinary tract infection)   . 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 Childhood asthma   . Depression   . Neuromuscular disorder     Quadraperesis    Past Surgical History  Procedure Date  . Lumbar puncture 10/12/2002  . Peg placement 03/21/2011    Procedure: PERCUTANEOUS ENDOSCOPIC GASTROSTOMY (PEG) PLACEMENT;  Surgeon: Beverley Fiedler, MD;  Location: [facility] ENDOSCOPY;  Service: Gastroenterology;  Laterality: N/A;  sara /ja new peg    Family History  Problem Relation Age of Onset  . Asthma Mother     History  Substance Use Topics  . Smoking status: Former Smoker    Types: Cigarettes    Quit date: 02/02/1999  . Smokeless tobacco: Former Neurosurgeon  . Alcohol Use: No      Review of Systems  Unable to perform ROS: Mental status change  Constitutional: Positive for activity change and appetite change.  Psychiatric/Behavioral: Positive for confusion.    Allergies  Review of patient's allergies indicates no known allergies.  Home Medications   No current outpatient prescriptions on file.  BP 123/79  Pulse 93  Temp(Src) 98.9 F (37.2 C) (Oral)  Resp 18  Ht 6\' 4"  (1.93 m)  Wt 148 lb 5.9 oz (67.3 kg)  BMI 18.06 kg/m2  SpO2  99%  Physical Exam  Nursing note and vitals reviewed. Constitutional: He appears well-developed and well-nourished.  HENT:  Head: Normocephalic and atraumatic.  Eyes: Right eye exhibits no discharge. Left eye exhibits no discharge.  Neck: Normal range of motion. Neck supple.  Cardiovascular: Regular rhythm and normal heart sounds.  Tachycardia present.   Pulmonary/Chest: Effort normal and breath sounds normal.  Abdominal: Soft. There is no tenderness.  Musculoskeletal: Normal range of motion. He exhibits no tenderness.  Neurological: He is alert. He displays atrophy (all extremities).  Skin: Skin is warm and dry.  Psychiatric: He has a normal mood and affect. His behavior is normal.    ED Course  Procedures (including critical care time)  Labs Reviewed  CBC - Abnormal; Notable for the following:    WBC 12.6 (*)    All other components within normal limits  DIFFERENTIAL - Abnormal; Notable for the following:    Neutrophils Relative 78 (*)    Neutro Abs 9.8 (*)    All other components within normal limits  BASIC METABOLIC PANEL - Abnormal; Notable for the following:    Glucose, Bld 102 (*)    All other components within normal limits  URINALYSIS, ROUTINE W REFLEX MICROSCOPIC - Abnormal; Notable for the following:    Color, Urine AMBER (*) BIOCHEMICALS MAY BE AFFECTED BY COLOR   APPearance TURBID (*)    Hgb urine dipstick MODERATE (*)    Bilirubin Urine SMALL (*)  Ketones, ur 15 (*)    Protein, ur 30 (*)    Leukocytes, UA LARGE (*)    All other components within normal limits  URINE MICROSCOPIC-ADD ON - Abnormal; Notable for the following:    Squamous Epithelial / LPF FEW (*)    Bacteria, UA FEW (*)    Crystals CA OXALATE CRYSTALS (*)    All other components within normal limits  CBC - Abnormal; Notable for the following:    WBC 11.1 (*)    RBC 4.15 (*)    Hemoglobin 11.8 (*)    HCT 35.3 (*)    All other components within normal limits  CREATININE, SERUM  URINE  CULTURE   Dg Chest Port 1 View  04/10/2011  *RADIOLOGY REPORT*  Clinical Data: Altered mental status, tachycardia, history of dysphagia  PORTABLE CHEST - 1 VIEW  Comparison: Portable exam 1707 hours compared to 03/23/2011  Findings: Normal heart size, mediastinal contours, and pulmonary vascularity. Minimal peribronchial thickening, chronic. No infiltrate, pleural effusion or pneumothorax. Bones unremarkable.  IMPRESSION: No acute abnormalities.  Original Report Authenticated By: Lollie Marrow, M.D.     1. Dehydration   2. Malnourished       Date: 04/11/2011  Rate: 110  Rhythm: sinus tachycardia  QRS Axis: normal  Intervals: normal  ST/T Wave abnormalities: normal  Conduction Disutrbances:none  Narrative Interpretation:   Old EKG Reviewed: unchanged   MDM  Pt here with decreased oral intake, less responsive today.  Has h/o chronic utis with indwelling foley.  Ct head no acute.  cxr nad.  Pt tachy, appears dehydrated on exam, has had decreased intake and weight loss recently 2/2 dysphagia and poor effort.  Appears to be failure to thrive, dehydration picture.  HR improved with ivf's.  Pt's wife and mother had been in talks recently with physicians about placing g tube but pt did not want this at that time.  I have consulted internal medicine for admission and to revisit the g tube issue.  Pt stable on admission        Elijio Miles, MD 04/11/11 [phone]

## 2011-04-11 ENCOUNTER — Encounter (HOSPITAL_COMMUNITY): Payer: Self-pay

## 2011-04-11 LAB — URINE CULTURE: Colony Count: >=100000

## 2011-04-11 MED ORDER — ADULT MULTIVITAMIN W/MINERALS CH
1.0000 | ORAL_TABLET | Freq: Every day | ORAL | Status: DC
  Administered 2011-04-11 – 2011-04-19 (×8): 1 via ORAL
  Filled 2011-04-11 (×9): qty 1

## 2011-04-11 MED ORDER — VANCOMYCIN HCL 1000 MG IV SOLR
1250.0000 mg | Freq: Once | INTRAVENOUS | Status: AC
  Administered 2011-04-11: 1250 mg via INTRAVENOUS
  Filled 2011-04-11: qty 1250

## 2011-04-11 MED ORDER — VANCOMYCIN HCL IN DEXTROSE 1-5 GM/200ML-% IV SOLN
1000.0000 mg | Freq: Two times a day (BID) | INTRAVENOUS | Status: DC
  Administered 2011-04-12: 1000 mg via INTRAVENOUS
  Filled 2011-04-11 (×2): qty 200

## 2011-04-11 NOTE — Progress Notes (Signed)
Speech Pathology Note This pt known to SLP from previous admit. Pt discharged on a puree diet with honey thick liquids via teaspoon. Family not currently available to discuss how pt has tolerated this, though report in chart is that pt has had poor PO intake recently. Currently Pt is not arousable for POs. Pt is on a Dysphagia 1 diet with pudding thick liquids, which is appropriate if pt requests PO.  SLP will return in one day to complete assessment.

## 2011-04-11 NOTE — Progress Notes (Signed)
ANTIBIOTIC CONSULT NOTE - INITIAL  Pharmacy Consult for Vancomycin Indication: UTI (usually resistant enterococcus)  No Known Allergies  Patient Measurements: Height: 6\' 4"  (193 cm) Weight: 148 lb 5.9 oz (67.3 kg) IBW/kg (Calculated) : 86.8  Adjusted Body Weight:   Vital Signs: Temp: 98.9 F (37.2 C) (04/03 1345) Temp src: Oral (04/03 1345) BP: 123/79 mmHg (04/03 1345) Pulse Rate: 93  (04/03 1345) Intake/Output from previous day: 04/02 0701 - 04/03 0700 In: 993.3 [I.V.:893.3; IV Piggyback:100] Out: 350 [Urine:350] Intake/Output from this shift:    Labs:  [facility] 04/10/11 2116 04/10/11 1700  WBC 11.1* 12.6*  HGB 11.8* 14.3  PLT 285 338  LABCREA -- --  CREATININE 0.75 0.93   Estimated Creatinine Clearance: 120.3 ml/min (by C-G formula based on Cr of 0.75). No results found for this basename: VANCOTROUGH:2,VANCOPEAK:2,VANCORANDOM:2,GENTTROUGH:2,GENTPEAK:2,GENTRANDOM:2,TOBRATROUGH:2,TOBRAPEAK:2,TOBRARND:2,AMIKACINPEAK:2,AMIKACINTROU:2,AMIKACIN:2, in the last 72 hours   Microbiology: Recent Results (from the past 720 hour(s))  URINE CULTURE     Status: Normal   Collection Time   03/18/11  8:26 AM      Component Value Range Status Comment   Specimen Description URINE, RANDOM   Final    Special Requests NONE   Final    Culture  Setup Time [id number]   Final    Colony Count >=100,000 COLONIES/ML   Final    Culture ENTEROCOCCUS SPECIES   Final    Report Status 03/20/2011 FINAL   Final    Organism ID, Bacteria ENTEROCOCCUS SPECIES   Final   CULTURE, BLOOD (ROUTINE X 2)     Status: Normal   Collection Time   03/18/11 11:20 AM      Component Value Range Status Comment   Specimen Description BLOOD RIGHT ARM   Final    Special Requests BOTTLES DRAWN AEROBIC AND ANAEROBIC 10CC   Final    Culture  Setup Time [id number]   Final    Culture NO GROWTH 5 DAYS   Final    Report Status 03/24/2011 FINAL   Final   CULTURE, BLOOD (ROUTINE X 2)     Status: Normal   Collection  Time   03/18/11 11:25 AM      Component Value Range Status Comment   Specimen Description BLOOD RIGHT HAND   Final    Special Requests BOTTLES DRAWN AEROBIC AND ANAEROBIC 10CC   Final    Culture  Setup Time [id number]   Final    Culture NO GROWTH 5 DAYS   Final    Report Status 03/24/2011 FINAL   Final     Medical History: Past Medical History  Diagnosis Date  . MS (multiple sclerosis)   . UTI (urinary tract infection)   . 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 Childhood asthma   . Depression   . Neuromuscular disorder     Quadraperesis    Medications:  Scheduled:    . baclofen  10 mg Oral TID  . cholecalciferol  1,000 Units Oral BID  . enoxaparin  40 mg Subcutaneous Q24H  . feeding supplement  1 Container Oral TID BM  . interferon beta-1b  0.3 mg Subcutaneous QODAY  . mulitivitamin with minerals  1 tablet Oral Daily  . omega-3 acid ethyl esters  1 g Oral Daily  . senna  2 tablet Oral Daily  . sodium chloride  3 mL Intravenous Q12H  . vancomycin  1,250 mg Intravenous Once  . vancomycin  1,000 mg Intravenous Q12H  . DISCONTD: ciprofloxacin  200 mg Intravenous BID   Assessment: 36 yr  old male with end stage multiple sclerosis and quadriplegia. He has recurrent UTI secondary to bladder stones. His last UTI in March was enterococcus sensitive to vancomycin.  Goal of Therapy:  Vancomycin trough level 15-20 mcg/ml  Plan:  Pt was hospitalized in March with a UTI which was treated with Vancomycin. Will give a loading dose of 1250 mg then start him back on the dose he was on at his last admission of 1 Gm Vancomycin q12 hrs. CrCl is probably not accurate in this quadriplegic patient. Will need to get a vancomycin trough at steady state to check dose.  Eugene Garnet 04/11/2011,9:13 PM

## 2011-04-11 NOTE — Progress Notes (Signed)
   CARE MANAGEMENT NOTE 04/11/2011  Patient:  Terry Palmer, Terry Palmer   Account Number:  [id number]  Date Initiated:  04/11/2011  Documentation initiated by:  Donn Pierini  Subjective/Objective Assessment:   Pt admitted with encephalopathy     Action/Plan:   PTA pt was living at home with wife, assisted with ADLs- had HH with [facility]   Anticipated DC Date:  04/14/2011   Anticipated DC Plan:  HOME W HOME HEALTH SERVICES      DC Planning Services  CM consult      [facility] Choice  Resumption Of Svcs/PTA Provider   Choice offered to / List presented to:             Status of service:  In process, will continue to follow Medicare Important Message given?   (If response is "NO", the following Medicare IM given date fields will be blank) Date Medicare IM given:   Date Additional Medicare IM given:    Discharge Disposition:    Per UR Regulation:    If discussed at Long Length of Stay Meetings, dates discussed:    Comments:  PCP- [place]  04/11/11- 1200- Donn Pierini RN, BSN [phone] Pt was recently discharged home with [facility] services through [facility] for HH-RN/PT/OT/ST/aide/CSW. Pt has a hospital bed and air overlay at home. Arrangements were also made for pt to be followed at home by Dr. Florentina Jenny. Will need resumption orders for [facility] at time of discharge. CM to follow

## 2011-04-11 NOTE — Progress Notes (Signed)
Patient ID: Terry Palmer, male   DOB: 13-Sep-1975, 36 y.o.   MRN: [id number] PATIENT DETAILS Name: Terry Palmer Age: 36 y.o. Sex: male Date of Birth: 1975/05/11 Admit Date: 04/10/2011 PCP:@PCP    Interim History:  Patient with end stage multiple sclerosis and quadriplegia.  Has recurrent urinary tract infections secondary to bladder stones.  These urinary tract infections cause altered mental status.  Previously in March his urinary tract infection was enterococcus sensitive to vancomycin. Of note the family has refused hospice services and palliative care consult in the past.  Further he has a history of aspiration.  PEG tube placement was attempted by GI and IR last admission but they were unable to complete gastrostomy placement. The family elected not to have surgical placement. The patient is a full code.  Subjective: The patient denies pain.  He looks at me and answers yes and no to questions this afternoon (he did not this morning).  Objective: Weight change:   Intake/Output Summary (Last 24 hours) at 04/11/11 1604 Last data filed at 04/11/11 0610  Gross per 24 hour  Intake 993.33 ml  Output    350 ml  Net 643.33 ml   Blood pressure 123/79, pulse 93, temperature 98.9 F (37.2 C), temperature source Oral, resp. rate 18, height 6\' 4"  (1.93 m), weight 67.3 kg (148 lb 5.9 oz), SpO2 99.00%. Filed Vitals:   04/10/11 1930 04/10/11 2130 04/11/11 0559 04/11/11 1345  BP: 112/75 107/72 128/76 123/79  Pulse: 104 100 99 93  Temp:  97.9 F (36.6 C) 100.4 F (38 C) 98.9 F (37.2 C)  TempSrc:  Axillary Axillary Oral  Resp: 16 16 17 18   Height:  6\' 4"  (1.93 m)    Weight:  67.3 kg (148 lb 5.9 oz)    SpO2: 99% 99% 100% 99%    Physical Exam: General: No acute distress, slight slow nystagmus  Lungs: Clear to auscultation bilaterally without wheezes or crackles Cardiovascular: Regular rate and rhythm without murmur gallop or rub normal S1 and S2 Abdomen: thin Nontender,  nondistended, soft, bowel sounds positive, no rebound, no ascites, no appreciable mass Extremities:  Contracted LEs. No significant cyanosis, clubbing, or edema bilateral lower extremities  Basic Metabolic Panel:  Lab 04/10/11 4098 04/10/11 1700  NA -- 141  K -- 4.1  CL -- 104  CO2 -- 25  GLUCOSE -- 102*  BUN -- 12  CREATININE 0.75 0.93  CALCIUM -- 9.3  MG -- --  PHOS -- --   CBC:  Lab 04/10/11 2116 04/10/11 1700  WBC 11.1* 12.6*  NEUTROABS -- 9.8*  HGB 11.8* 14.3  HCT 35.3* 41.5  MCV 85.1 84.7  PLT 285 338    Studies/Results: Scheduled Meds:   . baclofen  10 mg Oral TID  . cholecalciferol  1,000 Units Oral BID  . ciprofloxacin  200 mg Intravenous BID  . enoxaparin  40 mg Subcutaneous Q24H  . feeding supplement  1 Container Oral TID BM  . ibuprofen  200 mg Oral Once  . interferon beta-1b  0.3 mg Subcutaneous QODAY  . mulitivitamin with minerals  1 tablet Oral Daily  . omega-3 acid ethyl esters  1 g Oral Daily  . senna  2 tablet Oral Daily  . sodium chloride  1,000 mL Intravenous Once  . sodium chloride  1,000 mL Intravenous Once  . sodium chloride  3 mL Intravenous Q12H   Continuous Infusions:   . dextrose 5 % and 0.9% NaCl 100 mL/hr at 04/11/11 0807  PRN Meds:.food thickener  Anti-infectives:  Anti-infectives     Start     Dose/Rate Route Frequency Ordered Stop   04/10/11 2330   ciprofloxacin (CIPRO) IVPB 200 mg  Status:  Discontinued        200 mg 100 mL/hr over 60 Minutes Intravenous 2 times daily 04/10/11 2103 04/11/11 1613          Assessment/Plan: Active Problems:  Multiple sclerosis exacerbation  Asthma  Weakness generalized  Encephalopathy  Hematuria  Quadriparesis (muscle weakness)  Feeding difficulties  1.  Altered mental status.  Improving.  Able to answer yes/no.  2.  Urinary Tract Infection.  Recurrent despite prophylatic macrobid. Usually resistant enterococcus.   Will d/c cipro and start vanc per pharmacy.  Follow culture and  sensitivities.  3.  MS.  End stage.  Per neurology last admit there is nothing else to try.  Family still hopeful for treatment.  Refuse [employer]  4.  Dysphagia with Aspiration.  If family wants PEG it will need to be placed by Surgery.   5.  DVT Prophylaxis.  Lovenox.  Family (mother and wife) very involved in patient's care.  Went to patient's room twice to see if family was there but I did not catch them to gather their current expectations and plans.   LOS: 1 day   Conley Canal [facility] 04/11/2011, 4:18 PM [phone]

## 2011-04-11 NOTE — Progress Notes (Signed)
04/11/2011 Hiep Ollis SPARKS Case Management Note [phone]  Utilization review completed.  

## 2011-04-11 NOTE — Progress Notes (Signed)
Addendum  Patient seen and examined, chart and data base reviewed.  I agree with the above assessment and plan  For full details please see Mrs. Algis Downs PA. Note.  Advanced MS and bedfast status. He came in for UTI.  Clint Lipps Pager: [phone] 04/11/2011, 4:45 PM

## 2011-04-11 NOTE — Progress Notes (Signed)
INITIAL ADULT NUTRITION ASSESSMENT Date: 04/11/2011   Time: 2:51 PM Reason for Assessment: Dysphagia, Pudding thick liquids  ASSESSMENT: Male 36 y.o.  Dx: AMS, UTI  Hx:  Past Medical History  Diagnosis Date  . MS (multiple sclerosis)   . UTI (urinary tract infection)   . 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 Childhood asthma   . Depression   . Neuromuscular disorder     Quadraperesis    Related Meds:     . baclofen  10 mg Oral TID  . cholecalciferol  1,000 Units Oral BID  . ciprofloxacin  200 mg Intravenous BID  . enoxaparin  40 mg Subcutaneous Q24H  . feeding supplement  1 Container Oral TID BM  . ibuprofen  200 mg Oral Once  . interferon beta-1b  0.3 mg Subcutaneous QODAY  . omega-3 acid ethyl esters  1 g Oral Daily  . senna  2 tablet Oral Daily  . sodium chloride  1,000 mL Intravenous Once  . sodium chloride  1,000 mL Intravenous Once  . sodium chloride  3 mL Intravenous Q12H     Ht: 6\' 4"  (193 cm)  Wt: 148 lb 5.9 oz (67.3 kg)  Ideal Wt: 80 kg % Ideal Wt: 84%  Usual Wt: 184 lbs Pt was 152 lbs at last admission, weight trended up through out admission and then was stable at 184 lbs at discharge. This was his UBW reported at last admission.   % Usual Wt: 80%  Body mass index is 18.06 kg/(m^2). Underweight  Food/Nutrition Related Hx: Pt with difficulty eating, dysphagia. Weight loss.   Labs:  CMP     Component Value Date/Time   NA 141 04/10/2011 1700   K 4.1 04/10/2011 1700   CL 104 04/10/2011 1700   CO2 25 04/10/2011 1700   GLUCOSE 102* 04/10/2011 1700   BUN 12 04/10/2011 1700   CREATININE 0.75 04/10/2011 2116   CALCIUM 9.3 04/10/2011 1700   PROT 6.5 03/12/2011 2054   ALBUMIN 3.1* 03/12/2011 2054   AST 23 03/12/2011 2054   ALT 18 03/12/2011 2054   ALKPHOS 98 03/12/2011 2054   BILITOT 0.4 03/12/2011 2054   GFRNONAA >90 04/10/2011 2116   GFRAA >90 04/10/2011 2116      Intake/Output Summary (Last 24 hours) at 04/11/11 1516 Last data filed at 04/11/11 1610  Gross  per 24 hour  Intake 993.33 ml  Output    350 ml  Net 643.33 ml     Diet Order: Dysphagia 1 with pudding thick liquids  Supplements/Tube Feeding: Ensure Pudding TID between meals  IVF:    dextrose 5 % and 0.9% NaCl Last Rate: 100 mL/hr at 04/11/11 0807    Estimated Nutritional Needs:   Kcal: 1600-1800  Protein: 80-90 gm Fluid: 1.6 - 1.8 L  Pt with recent admission. Previously was going to have PEG tube placed, but then family decided against it due to risk of complications. Pt was d/c'd on puree diet with honey thick liquids. RD spoke with wife, pt has to be encouraged to eat/swallow but will on command. Wife agreeable with supplements.  Since last admission pt has had weight loss, 36 lbs (19.5%) in about one month, severe weight loss. Pt may have some fluid losses/dehydration. Current weight is less then last admission weight. Pt likely has decreased PO intake. Also with Stage II pressure ulcer on sacrum.  Last admission pt had been dx with severe malnutrition in the context of chronic illness, this continues.   NUTRITION DIAGNOSIS: -Inadequate  oral intake (NI-2.1).  Status: Ongoing  RELATED TO: MS, dysphagia  AS EVIDENCE BY: weight loss, dehydration  MONITORING/EVALUATION(Goals): Goal: PO intake will be adequate to maintain weight and promote healing of pressure ulcer Monitor: PO intake, weight, labs, I/O's, initiation of NS and GOC  EDUCATION NEEDS: -No education needs identified at this time  INTERVENTION: 1. Agree with Ensure pudding TID, continue 2. Add multivitamin 3. RD will continue to follow  Dietitian [phone]  DOCUMENTATION CODES Per approved criteria  -Severe malnutrition in the context of chronic illness -Underweight    KOWALSKI, Zarina Pe MARIE 04/11/2011, 2:51 PM

## 2011-04-12 LAB — CBC
Platelets: 228 10*3/uL (ref 150–400)
RBC: 3.9 MIL/uL — ABNORMAL LOW (ref 4.22–5.81)
WBC: 10 10*3/uL (ref 4.0–10.5)

## 2011-04-12 LAB — BASIC METABOLIC PANEL
CO2: 20 mEq/L (ref 19–32)
Calcium: 8.4 mg/dL (ref 8.4–10.5)
Chloride: 106 mEq/L (ref 96–112)
Sodium: 136 mEq/L (ref 135–145)

## 2011-04-12 MED ORDER — KCL IN DEXTROSE-NACL 20-5-0.9 MEQ/L-%-% IV SOLN
INTRAVENOUS | Status: DC
  Administered 2011-04-12 – 2011-04-17 (×7): via INTRAVENOUS
  Filled 2011-04-12 (×15): qty 1000

## 2011-04-12 MED ORDER — FLUCONAZOLE IN SODIUM CHLORIDE 400-0.9 MG/200ML-% IV SOLN
400.0000 mg | INTRAVENOUS | Status: DC
  Administered 2011-04-12 – 2011-04-17 (×6): 400 mg via INTRAVENOUS
  Filled 2011-04-12 (×8): qty 200

## 2011-04-12 NOTE — Progress Notes (Signed)
Addendum  Patient seen and examined, chart and data base reviewed.  I agree with the above assessment and plan  For full details please see Mrs. Cordelia Pen NP note.  Urine showing yeast. Start Diflucan.  Clint Lipps Pager: [phone] 04/12/2011, 3:02 PM

## 2011-04-12 NOTE — Progress Notes (Signed)
   CARE MANAGEMENT NOTE 04/12/2011  Patient:  Terry Palmer, Terry Palmer   Account Number:  [id number]  Date Initiated:  04/11/2011  Documentation initiated by:  Donn Pierini  Subjective/Objective Assessment:   Pt admitted with encephalopathy     Action/Plan:   PTA pt was living at home with wife, assisted with ADLs- had HH with [facility]   Anticipated DC Date:  04/14/2011   Anticipated DC Plan:  HOME W HOME HEALTH SERVICES      DC Planning Services  CM consult      [facility] Choice  Resumption Of Svcs/PTA Provider   Choice offered to / List presented to:             Status of service:  In process, will continue to follow Medicare Important Message given?   (If response is "NO", the following Medicare IM given date fields will be blank) Date Medicare IM given:   Date Additional Medicare IM given:    Discharge Disposition:    Per UR Regulation:    If discussed at Long Length of Stay Meetings, dates discussed:    Comments:  PCP- Avbuere  04/12/11 15:50 Letha Cape RN, BSN [phone] patient has poor po intake, just started on Diflucan, was on Vanc iv and cipro iv but dc'd.  Will need resumption of Care [facility] services for RN,PT, OT, ST, aide and CSW when ready for dc.  04/11/11- 1200- Donn Pierini RN, BSN [phone] Pt was recently discharged home with [facility] services through [facility] for HH-RN/PT/OT/ST/aide/CSW. Pt has a hospital bed and air overlay at home. Arrangements were also made for pt to be followed at home by Dr. Florentina Jenny. Will need resumption orders for [facility] at time of discharge. CM to follow

## 2011-04-12 NOTE — Progress Notes (Signed)
Speech Pathology Note Pt known to this SLP from previous admission. Pt was sleeping on arrival, has not been mostly nonverbal/sleeping today.  Wife states that pt has been consuming pureed diet with pudding thick liquids without significant choking events. Asked wife if she or Mr. Stuckey feel that they would want to upgrade to thinner liquids for increased pleasure/intake. Wife is unsure, is comfortable with current consistency. Given pts general decline since last admit, would not recommend changing pts diet unless LOA/strength improves or if pt transitions to comfort care. No further skilled treatment needed at this time, will defer full eval. Please reorder if any changes in pt status warrant reevaluation. Harlon Ditty, MA CCC-SLP [phone]

## 2011-04-12 NOTE — Progress Notes (Signed)
Received call from Ransom at [facility] states patient is approved for inpatient, authorization is [id number],WUJ will still need further clinical to further approve for remaining days of stay.

## 2011-04-12 NOTE — Progress Notes (Signed)
PATIENT DETAILS Name: Terry Palmer Age: 36 y.o. Sex: male Date of Birth: 10/21/1975 Admit Date: 04/10/2011 ZOX:[id number] A, MD, MD POA:   CONSULTS: None   Interim history:  Urine cx >100,000 yeast. WBC 10.0<--11.1<--12.6   Subjective: Nonverbal on exam  Objective: Vital signs in last 24 hours: Temp:  [98.3 F (36.8 C)-99.8 F (37.7 C)] 99.8 F (37.7 C) (04/04 0519) Pulse Rate:  [104-105] 104  (04/04 0519) Resp:  [18] 18  (04/04 0519) BP: (133-137)/(70-76) 137/70 mmHg (04/04 0519) SpO2:  [100 %] 100 % (04/04 0519) Weight change:  Last BM Date:  (prior to admission)  Intake/Output from previous day:  Intake/Output Summary (Last 24 hours) at 04/12/11 1349 Last data filed at 04/12/11 1100  Gross per 24 hour  Intake 2833.33 ml  Output   1000 ml  Net 1833.33 ml     Physical Exam:  Gen:  Chronically weak appearing, opening eyes to verbal stimuli, but nonverbal Cardiovascular:  S1S2 RRR, no m/r/g Respiratory: CTAB, no w/r/c, no increased wob Gastrointestinal: abdomen flat, soft, NT/ND, BS+ Extremities: contracted LE's. No c/ce GU: foley draining clear, yellow urine.    Lab Results:  Lab 04/12/11 0537 04/10/11 2116 04/10/11 1700  HGB 11.0* 11.8* 14.3  HCT 33.5* 35.3* 41.5  WBC 10.0 11.1* 12.6*  PLT 228 285 338     Lab 04/12/11 0537 04/10/11 2116 04/10/11 1700  NA 136 -- 141  K 3.4* -- 4.1  CL 106 -- 104  CO2 20 -- 25  GLUCOSE 105* -- 102*  BUN 3* -- 12  CREATININE 0.64 0.75 0.93  CALCIUM 8.4 -- 9.3  MG -- -- --  PHOS -- -- --    Studies/Results: Dg Chest Port 1 View  04/10/2011  *RADIOLOGY REPORT*  Clinical Data: Altered mental status, tachycardia, history of dysphagia  PORTABLE CHEST - 1 VIEW  Comparison: Portable exam 1707 hours compared to 03/23/2011  Findings: Normal heart size, mediastinal contours, and pulmonary vascularity. Minimal peribronchial thickening, chronic. No infiltrate, pleural effusion or pneumothorax. Bones unremarkable.   IMPRESSION: No acute abnormalities.  Original Report Authenticated By: Lollie Marrow, M.D.    Medications: Scheduled Meds:   . baclofen  10 mg Oral TID  . cholecalciferol  1,000 Units Oral BID  . enoxaparin  40 mg Subcutaneous Q24H  . feeding supplement  1 Container Oral TID BM  . fluconazole (DIFLUCAN) IV  400 mg Intravenous Q24H  . interferon beta-1b  0.3 mg Subcutaneous QODAY  . mulitivitamin with minerals  1 tablet Oral Daily  . omega-3 acid ethyl esters  1 g Oral Daily  . senna  2 tablet Oral Daily  . sodium chloride  3 mL Intravenous Q12H  . vancomycin  1,250 mg Intravenous Once  . DISCONTD: ciprofloxacin  200 mg Intravenous BID  . DISCONTD: vancomycin  1,000 mg Intravenous Q12H   Continuous Infusions:   . dextrose 5 % and 0.9% NaCl 100 mL (04/12/11 0746)   PRN Meds:.food thickener Antibiotics: Anti-infectives     Start     Dose/Rate Route Frequency Ordered Stop   04/12/11 1200   fluconazole (DIFLUCAN) IVPB 400 mg        400 mg 200 mL/hr over 60 Minutes Intravenous Every 24 hours 04/12/11 1106     04/12/11 0600   vancomycin (VANCOCIN) IVPB 1000 mg/200 mL premix  Status:  Discontinued        1,000 mg 200 mL/hr over 60 Minutes Intravenous Every 12 hours 04/11/11 1725 04/12/11 1106  04/11/11 1800   vancomycin (VANCOCIN) 1,250 mg in sodium chloride 0.9 % 250 mL IVPB        1,250 mg 166.7 mL/hr over 90 Minutes Intravenous  Once 04/11/11 1723 04/11/11 1954   04/10/11 2330   ciprofloxacin (CIPRO) IVPB 200 mg  Status:  Discontinued        200 mg 100 mL/hr over 60 Minutes Intravenous 2 times daily 04/10/11 2103 04/11/11 1613           Assessment/Plan: Patient with end stage multiple sclerosis and quadriplegia. Has recurrent urinary tract infections secondary to bladder stones. These urinary tract infections cause altered mental status. Previously in March his urinary tract infection was enterococcus sensitive to vancomycin. Of note the family has refused hospice  services and palliative care consult in the past. Further he has a history of aspiration. PEG tube placement was attempted by GI and IR last admission but they were unable to complete gastrostomy placement. The family elected not to have surgical placement. The patient is a full code.   1. Yeast UTI:  Start Diflucan. Ok to d/c vancomycin. Prophylactic macrobid as pta   2. Acute encephalopathy: still not at baseline. Apparently, pt has had similar presentation in the past with UTIs. CT head on admit with no acute findings.   3. Hypokalemia, mild: replete with IVFs  4. End-stage multiple sclerosis: family refused hospice and revoked DNR during recent hospitalization. They did undergo a palliative medicine consult on 03/17/11. They are of muslim faith and are receiving guidance from their Imam.   5. Chronic dysphagia in setting of MS: PEG tube unable to be placed by IR and GI during recent admission due to difficult anatomy. Family has declined surgical placement. If patient continues to remain lethargic with no po intake, will need to discuss TPN with family.  6. DVT prophylaxis: on Lovenox  Cordelia Pen, NP-C [facility]  pgr [phone]   LOS: 2 days    04/12/2011, 1:49 PM

## 2011-04-12 NOTE — ED Provider Notes (Signed)
I saw and evaluated the patient, reviewed the resident's note and I agree with the findings and plan.   .Face to face Exam:  General:  Awake HEENT:  Atraumatic Resp:  Normal effort Abd:  Nondistended Neuro:No focal weakness Lymph: No adenopathy   Nelia Shi, MD 04/12/11 1116

## 2011-04-13 ENCOUNTER — Encounter (HOSPITAL_COMMUNITY): Payer: Self-pay | Admitting: General Surgery

## 2011-04-13 DIAGNOSIS — R1310 Dysphagia, unspecified: Secondary | ICD-10-CM

## 2011-04-13 LAB — CBC
MCH: 27.9 pg (ref 26.0–34.0)
MCHC: 33.5 g/dL (ref 30.0–36.0)
MCV: 83.1 fL (ref 78.0–100.0)
Platelets: 254 10*3/uL (ref 150–400)
RBC: 3.91 MIL/uL — ABNORMAL LOW (ref 4.22–5.81)

## 2011-04-13 LAB — BASIC METABOLIC PANEL
BUN: 3 mg/dL — ABNORMAL LOW (ref 6–23)
CO2: 20 mEq/L (ref 19–32)
Calcium: 8.4 mg/dL (ref 8.4–10.5)
Creatinine, Ser: 0.66 mg/dL (ref 0.50–1.35)
Glucose, Bld: 98 mg/dL (ref 70–99)

## 2011-04-13 MED ORDER — POTASSIUM CHLORIDE 20 MEQ/15ML (10%) PO LIQD
40.0000 meq | Freq: Two times a day (BID) | ORAL | Status: AC
  Administered 2011-04-13 (×2): 40 meq via ORAL
  Filled 2011-04-13 (×2): qty 30

## 2011-04-13 NOTE — Progress Notes (Signed)
Pt resting comfortably in bed. Turn every 2 hour from side to side. Show no immediate sign of distress. Bed in low position. Will continue to monitor.

## 2011-04-13 NOTE — Progress Notes (Signed)
Addendum  Patient seen and examined, chart and data base reviewed.  I agree with the above assessment and plan  For full details please see Mrs. Algis Downs PA. Note.  Surgical consultation for PEG-tube placement.  I think he back to baseline. He answer yes/no questions.  Clint Lipps Pager: [phone] 04/13/2011, 11:50 AM

## 2011-04-13 NOTE — Progress Notes (Signed)
   CARE MANAGEMENT NOTE 04/13/2011  Patient:  Terry Palmer, Terry Palmer   Account Number:  [id number]  Date Initiated:  04/11/2011  Documentation initiated by:  Donn Pierini  Subjective/Objective Assessment:   Pt admitted with encephalopathy     Action/Plan:   PTA pt was living at home with wife, assisted with ADLs- had HH with [facility]   Anticipated DC Date:  04/17/2011   Anticipated DC Plan:  HOME W HOME HEALTH SERVICES      DC Planning Services  CM consult      [facility] Choice  Resumption Of Svcs/PTA Provider   Choice offered to / List presented to:  C-3 Spouse        HH arranged  HH-1 RN  HH-2 PT  HH-3 OT  HH-4 NURSE'S AIDE  HH-5 SPEECH THERAPY  HH-6 SOCIAL WORKER      HH agency  [facility]   Status of service:  In process, will continue to follow Medicare Important Message given?   (If response is "NO", the following Medicare IM given date fields will be blank) Date Medicare IM given:   Date Additional Medicare IM given:    Discharge Disposition:    Per UR Regulation:    If discussed at Long Length of Stay Meetings, dates discussed:    Comments:  PCP- Avbuere  04/13/11 15:37 Letha Cape RN, BSN [phone] patient will have the surgery for the peg tube on Monday, Patient will resume services with [facility], PT,OT, St, aide and Child psychotherapist.  Referral made to [facility] with [facility], Corrie Dandy will come by hospital on Monday to get orders.  04/12/11 15:50 Letha Cape RN, BSN [phone] patient has poor po intake, just started on Diflucan, was on Vanc iv and cipro iv but dc'd.  Will need resumption of Care [facility] services for RN,PT, OT, ST, aide and CSW when ready for dc.  04/11/11- 1200- Donn Pierini RN, BSN [phone] Pt was recently discharged home with [facility] services through [facility] for HH-RN/PT/OT/ST/aide/CSW. Pt has a hospital bed and air overlay at home. Arrangements were also made for pt to be followed at home by Dr. Florentina Jenny. Will need  resumption orders for [facility] at time of discharge. CM to follow

## 2011-04-13 NOTE — Consult Note (Signed)
Terry Palmer 04/23/1975  [id number].   Requesting MD: Dr. Arthor Captain Chief Complaint/Reason for Consult: request for G/J tube HPI: This is a 36 yo male with end stage MS who has dysphagia as well as recurrent UTIs that cause anorexia.  I met the patient and his family several weeks ago for the same request.  The family was not certain at that time that they wanted to pursue a feeding tube option.  The patient has been readmitted for another UTI and is having increasing difficulties with dysphagia per his mom.  The patient is essentially nonverbal.  The mom states that he is not eating much at home either.  We have been asked to see him again as the patient and family are now all agreeable for feeding tube placement.  Review of Systems: Unable to obtain from the patient.  Family History  Problem Relation Age of Onset  . Asthma Mother     Past Medical History  Diagnosis Date  . MS (multiple sclerosis)   . UTI (urinary tract infection)   . 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 Childhood asthma   . Depression   . Neuromuscular disorder     Quadraperesis    Past Surgical History  Procedure Date  . Lumbar puncture 10/12/2002    Social History:  reports that he quit smoking about 12 years ago. His smoking use included Cigarettes. He has quit using smokeless tobacco. He reports that he uses illicit drugs (Marijuana and Cocaine). He reports that he does not drink alcohol.  Allergies: No Known Allergies  Medications Prior to Admission  Medication Dose Route Frequency Provider Last Rate Last Dose  . baclofen (LIORESAL) tablet 10 mg  10 mg Oral TID Nishant Dhungel, MD   10 mg at 04/13/11 1049  . cholecalciferol (VITAMIN D) tablet 1,000 Units  1,000 Units Oral BID Nishant Dhungel, MD   1,000 Units at 04/13/11 1049  . dextrose 5 % and 0.9 % NaCl with KCl 20 mEq/L infusion   Intravenous Continuous Cordelia Pen, NP 75 mL/hr at 04/13/11 [phone]    . enoxaparin (LOVENOX) injection 40 mg   40 mg Subcutaneous Q24H Nishant Dhungel, MD   40 mg at 04/13/11 0029  . feeding supplement (ENSURE) pudding 1 Container  1 Container Oral TID BM Nelia Shi, MD   1 Container at 04/13/11 1000  . fluconazole (DIFLUCAN) IVPB 400 mg  400 mg Intravenous Q24H Cordelia Pen, NP   400 mg at 04/13/11 1228  . food thickener (THICK IT) powder   Oral PRN Caroline More, NP      . ibuprofen (ADVIL,MOTRIN) tablet 200 mg  200 mg Oral Once Elijio Miles, MD   200 mg at 04/10/11 1856  . interferon beta-1b (BETASERON) injection 0.3 mg  0.3 mg Subcutaneous QODAY Clydia Llano, MD   0.3 mg at 04/12/11 1726  . mulitivitamin with minerals tablet 1 tablet  1 tablet Oral Daily Tonye Becket, RD   1 tablet at 04/13/11 1049  . omega-3 acid ethyl esters (LOVAZA) capsule 1 g  1 g Oral Daily Nishant Dhungel, MD   1 g at 04/13/11 1049  . potassium chloride 20 MEQ/15ML (10%) liquid 40 mEq  40 mEq Oral BID Stephani Police, PA   40 mEq at 04/13/11 1054  . senna (SENOKOT) tablet 17.2 mg  2 tablet Oral Daily Nishant Dhungel, MD   17.2 mg at 04/13/11 1049  . sodium chloride 0.9 % bolus 1,000 mL  1,000 mL Intravenous  Once Elijio Miles, MD   1,000 mL at 04/10/11 1707  . sodium chloride 0.9 % bolus 1,000 mL  1,000 mL Intravenous Once Elijio Miles, MD   1,000 mL at 04/10/11 1816  . sodium chloride 0.9 % injection 3 mL  3 mL Intravenous Q12H Nishant Dhungel, MD   3 mL at 04/13/11 1054  . vancomycin (VANCOCIN) 1,250 mg in sodium chloride 0.9 % 250 mL IVPB  1,250 mg Intravenous Once Clydia Llano, MD   1,250 mg at 04/11/11 1824  . DISCONTD: ciprofloxacin (CIPRO) IVPB 200 mg  200 mg Intravenous BID Nishant Dhungel, MD   200 mg at 04/11/11 1031  . DISCONTD: dextrose 5 %-0.9 % sodium chloride infusion   Intravenous Continuous Nishant Dhungel, MD 100 mL/hr at 04/12/11 0746 100 mL at 04/12/11 0746  . DISCONTD: vancomycin (VANCOCIN) IVPB 1000 mg/200 mL premix  1,000 mg Intravenous Q12H Clydia Llano, MD   1,000 mg at 04/12/11 0518    Medications Prior to Admission  Medication Sig Dispense Refill  . cholecalciferol (VITAMIN D) 1000 UNITS tablet Take 1,000 Units by mouth 2 (two) times daily.       . Garlic 1000 MG CAPS Take 1,000 mg by mouth daily.        . interferon beta-1b (BETASERON) 0.3 MG injection Inject 0.3 mg into the skin every other day. Next dose due 04/10/2011        Blood pressure 105/68, pulse 98, temperature 97.5 F (36.4 C), temperature source Axillary, resp. rate 18, height 6\' 4"  (1.93 m), weight 148 lb 5.9 oz (67.3 kg), SpO2 99.00%. Physical Exam: General: 36 yo cachetic appearing male who is laying in bed in NAD HEENT: head is normocephalic, atraumatic.  Sclera are noninjected.  PERRL.  Ears and nose without any masses or lesions.  Mouth is pink and moist Heart: regular, rate, and rhythm.  Normal s1,s2. No obvious murmurs, gallops, or rubs noted.  Palpable radial and pedal pulses bilaterally Lungs: CTAB, no wheezes, rhonchi, or rales noted.  Respiratory effort nonlabored Abd: soft, NT, ND, +BS, no masses, hernias, or organomegaly MS: all 4 extremities are symmetrical with no cyanosis, clubbing, or edema.  Somewhat atrophic limbs Psych: the patient is awake but only answers yes or no questions if really pushed.  Unable to assess orientation    Results for orders placed during the hospital encounter of 04/10/11 (from the past 48 hour(s))  CBC     Status: Abnormal   Collection Time   04/12/11  5:37 AM      Component Value Range Comment   WBC 10.0  4.0 - 10.5 (K/uL)    RBC 3.90 (*) 4.22 - 5.81 (MIL/uL)    Hemoglobin 11.0 (*) 13.0 - 17.0 (g/dL)    HCT 16.1 (*) 09.6 - 52.0 (%)    MCV 85.9  78.0 - 100.0 (fL)    MCH 28.2  26.0 - 34.0 (pg)    MCHC 32.8  30.0 - 36.0 (g/dL)    RDW 04.5  40.9 - 81.1 (%)    Platelets 228  150 - 400 (K/uL)   BASIC METABOLIC PANEL     Status: Abnormal   Collection Time   04/12/11  5:37 AM      Component Value Range Comment   Sodium 136  135 - 145 (mEq/L)    Potassium 3.4  (*) 3.5 - 5.1 (mEq/L) DELTA CHECK NOTED   Chloride 106  96 - 112 (mEq/L)    CO2 20  19 - 32 (mEq/L)  Glucose, Bld 105 (*) 70 - 99 (mg/dL)    BUN 3 (*) 6 - 23 (mg/dL)    Creatinine, Ser 1.61  0.50 - 1.35 (mg/dL)    Calcium 8.4  8.4 - 10.5 (mg/dL)    GFR calc non Af Amer >90  >90 (mL/min)    GFR calc Af Amer >90  >90 (mL/min)   BASIC METABOLIC PANEL     Status: Abnormal   Collection Time   04/13/11  7:00 AM      Component Value Range Comment   Sodium 138  135 - 145 (mEq/L)    Potassium 3.2 (*) 3.5 - 5.1 (mEq/L)    Chloride 106  96 - 112 (mEq/L)    CO2 20  19 - 32 (mEq/L)    Glucose, Bld 98  70 - 99 (mg/dL)    BUN 3 (*) 6 - 23 (mg/dL)    Creatinine, Ser 0.96  0.50 - 1.35 (mg/dL)    Calcium 8.4  8.4 - 10.5 (mg/dL)    GFR calc non Af Amer >90  >90 (mL/min)    GFR calc Af Amer >90  >90 (mL/min)   CBC     Status: Abnormal   Collection Time   04/13/11  7:00 AM      Component Value Range Comment   WBC 7.5  4.0 - 10.5 (K/uL)    RBC 3.91 (*) 4.22 - 5.81 (MIL/uL)    Hemoglobin 10.9 (*) 13.0 - 17.0 (g/dL)    HCT 04.5 (*) 40.9 - 52.0 (%)    MCV 83.1  78.0 - 100.0 (fL)    MCH 27.9  26.0 - 34.0 (pg)    MCHC 33.5  30.0 - 36.0 (g/dL)    RDW 81.1  91.4 - 78.2 (%)    Platelets 254  150 - 400 (K/uL)    No results found.     Assessment/Plan 1. End stage MS 2. Dysphagia  Patient Active Problem List  Diagnoses  . Multiple sclerosis exacerbation  . Asthma  . Weakness generalized  . Encephalopathy  . Hematuria  . Quadriparesis (muscle weakness)  . Enterococcus UTI  . Malnutrition  . Feeding difficulties   Plan: 1. I have discussed placement of a feeding open G tube with the patient's mother.  His wife is currently not at the bedside.  When asked, the patient does say "yeah" to wanting a feeding tube.  It was initially expressed the desire for a GJ tube.  His stomach and intestines function properly.  I do not see an immediate need for a J tube as I do not think that will add any  additional benefits to the patient's care.  Sometimes you can get reflux with a G tube and have aspiration, but as long as the patient's stomach and intestines are working well, which they are, I do see that the patient needs anything beyond a G tube at this time.  I will have Dr. Ezzard Standing evaluate and make recommendations if he feels differently.  I discussed with the mother that if we can proceed with placement over the weekend we will try, but it may not be until early next week.  Thank you for this consult.  OSBORNE,KELLY E 04/13/2011, 1:38 PM  Agree with above. Wife was at the patient's side when I saw the patient.  I discussed the options with the wife. The patient himself is basically non-communicative. I reviewed with her the differences between jejunostomy tubes and gastrostomy tubes.  A gastrostomy tube would,  most likely, be much easier to manage. She also understands there is a risk to any surgery and her husband's condition would make any procedure risky. I told her it would most likely be next week before anything could be done.  DN  Ovidio Kin, MD, [facility] Pager: [phone] Office phone:  [phone]

## 2011-04-13 NOTE — Progress Notes (Signed)
Patient ID: Terry Palmer, male   DOB: Nov 28, 1975, 36 y.o.   MRN: [id number]  PATIENT DETAILS Name: Terry Palmer Age: 36 y.o. Sex: male Date of Birth: 1975-05-29 Admit Date: 04/10/2011 [name], MD, MD POA:   CONSULTS: [facility]   Interim history:  Family requests surgical consultation for J tube placement.  [name] mother understands that Terry Palmer's time is limited.  However, she requests that all conversation in front of him remain positive. Currently the patient is a full code by his own request.   Subjective: Nonverbal on exam  Objective: Vital signs in last 24 hours: Temp:  [97.5 F (36.4 C)-98.9 F (37.2 C)] 97.5 F (36.4 C) (04/05 0518) Pulse Rate:  [98-105] 98  (04/05 0518) Resp:  [18-19] 18  (04/05 0518) BP: (105-150)/(68-87) 105/68 mmHg (04/05 0518) SpO2:  [99 %-100 %] 99 % (04/05 0518) Weight change:  Last BM Date:  (PTA)  Intake/Output from previous day:  Intake/Output Summary (Last 24 hours) at 04/13/11 0739 Last data filed at 04/13/11 0644  Gross per 24 hour  Intake   1220 ml  Output   1800 ml  Net   -580 ml     Physical Exam:  Gen:  Chronically weak appearing, opening eyes to verbal stimuli, but nonverbal HEENT:  Eyes with slow nystagmus Cardiovascular:  S1S2 RRR, no m/r/g Respiratory: CTAB, no w/r/c, no increased wob Gastrointestinal: abdomen flat, soft, NT/ND, BS+ Extremities: contracted LE's. No c/ce GU: foley draining clear, yellow urine.    Lab Results:  Lab 04/12/11 0537 04/10/11 2116 04/10/11 1700  HGB 11.0* 11.8* 14.3  HCT 33.5* 35.3* 41.5  WBC 10.0 11.1* 12.6*  PLT 228 285 338     Lab 04/12/11 0537 04/10/11 2116 04/10/11 1700  NA 136 -- 141  K 3.4* -- 4.1  CL 106 -- 104  CO2 20 -- 25  GLUCOSE 105* -- 102*  BUN 3* -- 12  CREATININE 0.64 0.75 0.93  CALCIUM 8.4 -- 9.3  MG -- -- --  PHOS -- -- --    Studies/Results: No results found.  Medications: Scheduled Meds:    . baclofen   10 mg Oral TID  . cholecalciferol  1,000 Units Oral BID  . enoxaparin  40 mg Subcutaneous Q24H  . feeding supplement  1 Container Oral TID BM  . fluconazole (DIFLUCAN) IV  400 mg Intravenous Q24H  . interferon beta-1b  0.3 mg Subcutaneous QODAY  . mulitivitamin with minerals  1 tablet Oral Daily  . omega-3 acid ethyl esters  1 g Oral Daily  . senna  2 tablet Oral Daily  . sodium chloride  3 mL Intravenous Q12H  . DISCONTD: vancomycin  1,000 mg Intravenous Q12H   Continuous Infusions:    . dextrose 5 % and 0.9 % NaCl with KCl 20 mEq/L 75 mL/hr at 04/13/11 0644  . DISCONTD: dextrose 5 % and 0.9% NaCl 100 mL (04/12/11 0746)   PRN Meds:.food thickener Antibiotics: Anti-infectives     Start     Dose/Rate Route Frequency Ordered Stop   04/12/11 1200   fluconazole (DIFLUCAN) IVPB 400 mg        400 mg 200 mL/hr over 60 Minutes Intravenous Every 24 hours 04/12/11 1106     04/12/11 0600   vancomycin (VANCOCIN) IVPB 1000 mg/200 mL premix  Status:  Discontinued        1,000 mg 200 mL/hr over 60 Minutes Intravenous Every 12 hours 04/11/11 1725 04/12/11 1106   04/11/11 1800  vancomycin (VANCOCIN) 1,250 mg in sodium chloride 0.9 % 250 mL IVPB        1,250 mg 166.7 mL/hr over 90 Minutes Intravenous  Once 04/11/11 1723 04/11/11 1954   04/10/11 2330   ciprofloxacin (CIPRO) IVPB 200 mg  Status:  Discontinued        200 mg 100 mL/hr over 60 Minutes Intravenous 2 times daily 04/10/11 2103 04/11/11 1613           Assessment/Plan: Patient with end stage multiple sclerosis and quadriplegia. Has recurrent urinary tract infections secondary to bladder stones. These urinary tract infections cause altered mental status. Previously in March his urinary tract infection was enterococcus sensitive to vancomycin. Of note the family has refused hospice services and palliative care consult in the past. Further he has a history of aspiration. PEG tube placement was attempted by GI and IR last admission but  they were unable to complete gastrostomy placement. The family elected not to have surgical placement during that hospitalization. The patient is a full code.   1. Yeast UTI:  Start Diflucan 4/4. Ok to d/c vancomycin. Prophylactic macrobid as pta   2. Acute encephalopathy: - Resolved Wife describes baseline as blinking once for yes or twice no.  Or patient mouths words but does not speak. CT head on admit with no acute findings.   3. Hypokalemia, mild: replete with IVFs and potassium liquid.  4. End-stage multiple sclerosis: family refused hospice and revoked DNR during recent hospitalization. They did undergo a palliative medicine consult on 03/17/11. They are of muslim faith and are receiving guidance from their Imam.   5. Chronic dysphagia with aspiration (family is aware he aspirates) in setting of MS: Family would like surgery to evaluate for the placement of Gastrostomy.  It was suggested to the family by speech therapy that Jejunostomy be placed.  [facility] consulted for consideration of G/J placement.  6. DVT prophylaxis: on Lovenox  Terry Downs, PA-C [facility] Pager: [phone]    LOS: 3 days    04/13/2011, 7:39 AM

## 2011-04-14 LAB — BASIC METABOLIC PANEL
CO2: 21 mEq/L (ref 19–32)
Calcium: 8.6 mg/dL (ref 8.4–10.5)
Creatinine, Ser: 0.62 mg/dL (ref 0.50–1.35)

## 2011-04-14 NOTE — Progress Notes (Signed)
Patient ID: Terry Palmer, male   DOB: 1975/07/27, 36 y.o.   MRN: [id number]  PATIENT DETAILS Name: Terry Palmer Age: 36 y.o. Sex: male Date of Birth: February 16, 1975 Admit Date: 04/10/2011 [id number] A, MD, MD POA:   CONSULTS: [facility]   Interim history:  Family requests surgical consultation for J tube placement.  [name] mother understands that [place]'s time is limited.  However, she requests that all conversation in front of him remain positive. Currently the patient is a full code by his own request.   Subjective: Nonverbal on exam  Objective: Vital signs in last 24 hours: Temp:  [98.2 F (36.8 C)-99 F (37.2 C)] 98.2 F (36.8 C) (04/06 0621) Pulse Rate:  [93-99] 94  (04/06 0621) Resp:  [18-20] 20  (04/06 0621) BP: (107-112)/(71-75) 107/75 mmHg (04/06 0621) SpO2:  [97 %-99 %] 97 % (04/06 0621) Weight change:  Last BM Date:  (pta)  Intake/Output from previous day:  Intake/Output Summary (Last 24 hours) at 04/14/11 1232 Last data filed at 04/14/11 0900  Gross per 24 hour  Intake   1943 ml  Output   1850 ml  Net     93 ml     Physical Exam:  Gen:  Chronically weak appearing, opening eyes to verbal stimuli, but nonverbal HEENT:  Eyes with slow nystagmus Cardiovascular:  S1S2 RRR, no m/r/g Respiratory: CTAB, no w/r/c, no increased wob Gastrointestinal: abdomen flat, soft, NT/ND, BS+ Extremities: contracted LE's. No c/ce GU: foley draining clear, yellow urine.    Lab Results:  Lab 04/13/11 0700 04/12/11 0537 04/10/11 2116  HGB 10.9* 11.0* 11.8*  HCT 32.5* 33.5* 35.3*  WBC 7.5 10.0 11.1*  PLT 254 228 285     Lab 04/14/11 0500 04/13/11 0700 04/12/11 0537 04/10/11 2116 04/10/11 1700  NA 140 138 136 -- 141  K 3.5 3.2* -- -- --  CL 110 106 106 -- 104  CO2 21 20 20  -- 25  GLUCOSE 103* 98 105* -- 102*  BUN <3* 3* 3* -- 12  CREATININE 0.62 0.66 0.64 0.75 0.93  CALCIUM 8.6 8.4 8.4 -- 9.3  MG -- -- -- -- --  PHOS -- -- --  -- --    Studies/Results: No results found.  Medications: Scheduled Meds:    . baclofen  10 mg Oral TID  . cholecalciferol  1,000 Units Oral BID  . enoxaparin  40 mg Subcutaneous Q24H  . feeding supplement  1 Container Oral TID BM  . fluconazole (DIFLUCAN) IV  400 mg Intravenous Q24H  . interferon beta-1b  0.3 mg Subcutaneous QODAY  . mulitivitamin with minerals  1 tablet Oral Daily  . omega-3 acid ethyl esters  1 g Oral Daily  . potassium chloride  40 mEq Oral BID  . senna  2 tablet Oral Daily  . sodium chloride  3 mL Intravenous Q12H   Continuous Infusions:    . dextrose 5 % and 0.9 % NaCl with KCl 20 mEq/L 75 mL/hr at 04/14/11 1124   PRN Meds:.food thickener Antibiotics: Anti-infectives     Start     Dose/Rate Route Frequency Ordered Stop   04/12/11 1200   fluconazole (DIFLUCAN) IVPB 400 mg        400 mg 200 mL/hr over 60 Minutes Intravenous Every 24 hours 04/12/11 1106     04/12/11 0600   vancomycin (VANCOCIN) IVPB 1000 mg/200 mL premix  Status:  Discontinued        1,000 mg 200 mL/hr over 60  Minutes Intravenous Every 12 hours 04/11/11 1725 04/12/11 1106   04/11/11 1800   vancomycin (VANCOCIN) 1,250 mg in sodium chloride 0.9 % 250 mL IVPB        1,250 mg 166.7 mL/hr over 90 Minutes Intravenous  Once 04/11/11 1723 04/11/11 1954   04/10/11 2330   ciprofloxacin (CIPRO) IVPB 200 mg  Status:  Discontinued        200 mg 100 mL/hr over 60 Minutes Intravenous 2 times daily 04/10/11 2103 04/11/11 1613           Assessment/Plan: Patient with end stage multiple sclerosis and quadriplegia. Has recurrent urinary tract infections secondary to bladder stones. These urinary tract infections cause altered mental status. Previously in March his urinary tract infection was enterococcus sensitive to vancomycin. Of note the family has refused hospice services and palliative care consult in the past. Further he has a history of aspiration. PEG tube placement was attempted by GI  and IR last admission but they were unable to complete gastrostomy placement. The family elected not to have surgical placement during that hospitalization. The patient is a full code.   1. Yeast UTI:   -This is indwelling Foley catheter associated UTI, patient also has bladder stones increases his risks. -Start Diflucan 4/4. Ok to d/c vancomycin. Prophylactic macrobid as pta   2. Acute encephalopathy: - Resolved Wife describes baseline as blinking once for yes or twice no.  Or patient mouths words but does not speak. CT head on admit with no acute findings.   3. Hypokalemia, mild: replete with IVFs and potassium liquid.  4. End-stage multiple sclerosis: family refused hospice and revoked DNR during recent hospitalization. They did undergo a palliative medicine consult on 03/17/11. They are of muslim faith and are receiving guidance from their Imam.   5. Chronic dysphagia with aspiration (family is aware he aspirates) -Gen. surgery is following. -Gastrostomy tube, likely early next week.  6. DVT prophylaxis: on Lovenox  Clint Lipps Pager: [phone] 04/14/2011, 12:38 PM     LOS: 4 days

## 2011-04-15 MED ORDER — POTASSIUM CHLORIDE 20 MEQ/15ML (10%) PO LIQD
40.0000 meq | Freq: Once | ORAL | Status: AC
  Administered 2011-04-15: 40 meq via ORAL
  Filled 2011-04-15: qty 30

## 2011-04-15 NOTE — Progress Notes (Signed)
Patient ID: Terry Palmer, male   DOB: 1975/11/13, 36 y.o.   MRN: [id number]  PATIENT DETAILS Name: Terry Palmer Age: 36 y.o. Sex: male Date of Birth: Mar 10, 1975 Admit Date: 04/10/2011 [id number] A, MD, MD POA:   CONSULTS: [facility]   Subjective: His is back to his baseline, sitting, the nurse technician is feeding him. He answers questions with yes or no.  Objective: Vital signs in last 24 hours: Temp:  [98.1 F (36.7 C)-100.9 F (38.3 C)] 98.5 F (36.9 C) (04/07 0526) Pulse Rate:  [89-99] 92  (04/07 0526) Resp:  [16-18] 16  (04/07 0526) BP: (116-138)/(78-86) 121/79 mmHg (04/07 0526) SpO2:  [99 %] 99 % (04/07 0526) Weight change:  Last BM Date: 04/14/11  Intake/Output from previous day:  Intake/Output Summary (Last 24 hours) at 04/15/11 1306 Last data filed at 04/15/11 0900  Gross per 24 hour  Intake    965 ml  Output   1675 ml  Net   -710 ml     Physical Exam:  Gen:  Chronically weak appearing, opening eyes to verbal stimuli, but nonverbal HEENT:  Eyes with slow nystagmus Cardiovascular:  S1S2 RRR, no m/r/g Respiratory: CTAB, no w/r/c, no increased wob Gastrointestinal: abdomen flat, soft, NT/ND, BS+ Extremities: contracted LE's. No c/ce GU: foley draining clear, yellow urine.    Lab Results:  Lab 04/13/11 0700 04/12/11 0537 04/10/11 2116  HGB 10.9* 11.0* 11.8*  HCT 32.5* 33.5* 35.3*  WBC 7.5 10.0 11.1*  PLT 254 228 285     Lab 04/14/11 0500 04/13/11 0700 04/12/11 0537 04/10/11 2116 04/10/11 1700  NA 140 138 136 -- 141  K 3.5 3.2* -- -- --  CL 110 106 106 -- 104  CO2 21 20 20  -- 25  GLUCOSE 103* 98 105* -- 102*  BUN <3* 3* 3* -- 12  CREATININE 0.62 0.66 0.64 0.75 0.93  CALCIUM 8.6 8.4 8.4 -- 9.3  MG -- -- -- -- --  PHOS -- -- -- -- --    Studies/Results: No results found.  Medications: Scheduled Meds:    . baclofen  10 mg Oral TID  . cholecalciferol  1,000 Units Oral BID  . enoxaparin  40 mg Subcutaneous  Q24H  . feeding supplement  1 Container Oral TID BM  . fluconazole (DIFLUCAN) IV  400 mg Intravenous Q24H  . interferon beta-1b  0.3 mg Subcutaneous QODAY  . mulitivitamin with minerals  1 tablet Oral Daily  . omega-3 acid ethyl esters  1 g Oral Daily  . potassium chloride  40 mEq Oral Once  . senna  2 tablet Oral Daily  . sodium chloride  3 mL Intravenous Q12H   Continuous Infusions:    . dextrose 5 % and 0.9 % NaCl with KCl 20 mEq/L 75 mL/hr at 04/15/11 0237   PRN Meds:.food thickener Antibiotics: Anti-infectives     Start     Dose/Rate Route Frequency Ordered Stop   04/12/11 1200   fluconazole (DIFLUCAN) IVPB 400 mg        400 mg 200 mL/hr over 60 Minutes Intravenous Every 24 hours 04/12/11 1106     04/12/11 0600   vancomycin (VANCOCIN) IVPB 1000 mg/200 mL premix  Status:  Discontinued        1,000 mg 200 mL/hr over 60 Minutes Intravenous Every 12 hours 04/11/11 1725 04/12/11 1106   04/11/11 1800   vancomycin (VANCOCIN) 1,250 mg in sodium chloride 0.9 % 250 mL IVPB  1,250 mg 166.7 mL/hr over 90 Minutes Intravenous  Once 04/11/11 1723 04/11/11 1954   04/10/11 2330   ciprofloxacin (CIPRO) IVPB 200 mg  Status:  Discontinued        200 mg 100 mL/hr over 60 Minutes Intravenous 2 times daily 04/10/11 2103 04/11/11 1613           Assessment/Plan: Patient with end stage multiple sclerosis and quadriplegia. Has recurrent urinary tract infections secondary to bladder stones. These urinary tract infections cause altered mental status. Previously in March his urinary tract infection was enterococcus sensitive to vancomycin. Of note the family has refused hospice services and palliative care consult in the past. Further he has a history of aspiration. PEG tube placement was attempted by GI and IR last admission but they were unable to complete gastrostomy placement. The family elected not to have surgical placement during that hospitalization. The patient is a full code.   1.  Yeast UTI:   -This is indwelling Foley catheter associated UTI, patient also has bladder stones increases his risks. -Start Diflucan 4/4. Ok to d/c vancomycin. Prophylactic macrobid as pta   2. Acute encephalopathy: - Resolved Wife describes baseline as blinking once for yes or twice no.  Or patient mouths words but does not speak. CT head on admit with no acute findings.   3. Hypokalemia, mild: replete with IVFs and potassium liquid.  4. End-stage multiple sclerosis: family refused hospice and revoked DNR during recent hospitalization. They did undergo a palliative medicine consult on 03/17/11. They are of muslim faith and are receiving guidance from their Imam.   5. Chronic dysphagia with aspiration (family is aware he aspirates) -Gen. surgery is following. -Gastrostomy tube, likely early next week.  6. DVT prophylaxis: on Lovenox  Clint Lipps Pager: [phone] 04/15/2011, 1:06 PM     LOS: 5 days

## 2011-04-16 ENCOUNTER — Encounter (HOSPITAL_COMMUNITY)
Admission: EM | Disposition: A | Discharge status: Home Health | Payer: Self-pay | Source: Home / Self Care | Attending: Internal Medicine

## 2011-04-16 ENCOUNTER — Encounter (HOSPITAL_COMMUNITY): Payer: Self-pay | Admitting: Certified Registered"

## 2011-04-16 ENCOUNTER — Inpatient Hospital Stay (HOSPITAL_COMMUNITY): Payer: Medicare HMO | Admitting: Certified Registered"

## 2011-04-16 HISTORY — PX: GASTROSTOMY: SHX5249

## 2011-04-16 LAB — CBC
HCT: 36.5 % — ABNORMAL LOW (ref 39.0–52.0)
MCH: 28.5 pg (ref 26.0–34.0)
MCHC: 33.7 g/dL (ref 30.0–36.0)
MCV: 84.7 fL (ref 78.0–100.0)
RDW: 14.3 % (ref 11.5–15.5)

## 2011-04-16 SURGERY — INSERTION OF GASTROSTOMY TUBE
Anesthesia: General | Site: Abdomen | Wound class: Clean Contaminated

## 2011-04-16 MED ORDER — MORPHINE SULFATE 2 MG/ML IJ SOLN
1.0000 mg | INTRAMUSCULAR | Status: DC | PRN
  Administered 2011-04-16: 2 mg via INTRAVENOUS
  Administered 2011-04-17 (×2): 4 mg via INTRAVENOUS
  Filled 2011-04-16 (×2): qty 2
  Filled 2011-04-16 (×2): qty 1

## 2011-04-16 MED ORDER — ONDANSETRON HCL 4 MG/2ML IJ SOLN
INTRAMUSCULAR | Status: DC | PRN
  Administered 2011-04-16: 4 mg via INTRAVENOUS

## 2011-04-16 MED ORDER — GLYCOPYRROLATE 0.2 MG/ML IJ SOLN
INTRAMUSCULAR | Status: DC | PRN
  Administered 2011-04-16: .4 mg via INTRAVENOUS

## 2011-04-16 MED ORDER — ENOXAPARIN SODIUM 40 MG/0.4ML ~~LOC~~ SOLN
40.0000 mg | SUBCUTANEOUS | Status: DC
  Filled 2011-04-16: qty 0.4

## 2011-04-16 MED ORDER — LACTATED RINGERS IV SOLN
INTRAVENOUS | Status: DC
  Administered 2011-04-16: 12:00:00 via INTRAVENOUS

## 2011-04-16 MED ORDER — HYDROCODONE-ACETAMINOPHEN 7.5-500 MG/15ML PO SOLN
10.0000 mL | Freq: Four times a day (QID) | ORAL | Status: DC | PRN
  Administered 2011-04-19: 10 mL
  Filled 2011-04-16: qty 15

## 2011-04-16 MED ORDER — ONDANSETRON HCL 4 MG/2ML IJ SOLN: 4.0000 mg | Freq: Once | INTRAMUSCULAR | Status: DC | PRN

## 2011-04-16 MED ORDER — ENOXAPARIN SODIUM 40 MG/0.4ML ~~LOC~~ SOLN
40.0000 mg | SUBCUTANEOUS | Status: DC
  Administered 2011-04-17 – 2011-04-18 (×2): 40 mg via SUBCUTANEOUS
  Filled 2011-04-16 (×3): qty 0.4

## 2011-04-16 MED ORDER — PHENYLEPHRINE HCL 10 MG/ML IJ SOLN
INTRAMUSCULAR | Status: DC | PRN
  Administered 2011-04-16 (×2): 80 ug via INTRAVENOUS

## 2011-04-16 MED ORDER — 0.9 % SODIUM CHLORIDE (POUR BTL) OPTIME
TOPICAL | Status: DC | PRN
  Administered 2011-04-16: 1000 mL

## 2011-04-16 MED ORDER — PROPOFOL 10 MG/ML IV EMUL
INTRAVENOUS | Status: DC | PRN
  Administered 2011-04-16: 200 mg via INTRAVENOUS

## 2011-04-16 MED ORDER — LIDOCAINE HCL (CARDIAC) 20 MG/ML IV SOLN
INTRAVENOUS | Status: DC | PRN
  Administered 2011-04-16: 100 mg via INTRAVENOUS

## 2011-04-16 MED ORDER — HYDROMORPHONE HCL PF 1 MG/ML IJ SOLN
0.2500 mg | INTRAMUSCULAR | Status: DC | PRN
Start: 2011-04-16 — End: 2011-04-16
  Administered 2011-04-16: 0.25 mg via INTRAVENOUS

## 2011-04-16 MED ORDER — NEOSTIGMINE METHYLSULFATE 1 MG/ML IJ SOLN
INTRAMUSCULAR | Status: DC | PRN
  Administered 2011-04-16: 3 mg via INTRAVENOUS

## 2011-04-16 MED ORDER — FENTANYL CITRATE 0.05 MG/ML IJ SOLN
INTRAMUSCULAR | Status: DC | PRN
  Administered 2011-04-16: 100 ug via INTRAVENOUS
  Administered 2011-04-16: 50 ug via INTRAVENOUS

## 2011-04-16 MED ORDER — ROCURONIUM BROMIDE 100 MG/10ML IV SOLN
INTRAVENOUS | Status: DC | PRN
  Administered 2011-04-16: 30 mg via INTRAVENOUS

## 2011-04-16 MED ORDER — DEXTROSE 5 % IV SOLN
1.0000 g | Freq: Once | INTRAVENOUS | Status: AC
  Administered 2011-04-16: 1 g via INTRAVENOUS
  Filled 2011-04-16: qty 1

## 2011-04-16 SURGICAL SUPPLY — 45 items
BAG URINE DRAINAGE (UROLOGICAL SUPPLIES) ×1 IMPLANT
CANISTER SUCTION 2500CC (MISCELLANEOUS) ×2 IMPLANT
CATH MALECOT BARD  24FR (CATHETERS) ×1
CATH MALECOT BARD 24FR (CATHETERS) ×1 IMPLANT
CHLORAPREP W/TINT 26ML (MISCELLANEOUS) ×2 IMPLANT
CLOTH BEACON ORANGE TIMEOUT ST (SAFETY) ×2 IMPLANT
COVER SURGICAL LIGHT HANDLE (MISCELLANEOUS) ×2 IMPLANT
DRAPE LAPAROSCOPIC ABDOMINAL (DRAPES) ×2 IMPLANT
DRAPE UTILITY 15X26 W/TAPE STR (DRAPE) ×4 IMPLANT
ELECT CAUTERY BLADE 6.4 (BLADE) ×2 IMPLANT
ELECT REM PT RETURN 9FT ADLT (ELECTROSURGICAL) ×2
ELECTRODE REM PT RTRN 9FT ADLT (ELECTROSURGICAL) ×1 IMPLANT
GLOVE BIO SURGEON STRL SZ7 (GLOVE) ×1 IMPLANT
GLOVE BIO SURGEON STRL SZ8 (GLOVE) ×4 IMPLANT
GLOVE BIOGEL PI IND STRL 7.0 (GLOVE) IMPLANT
GLOVE BIOGEL PI IND STRL 8 (GLOVE) ×1 IMPLANT
GLOVE BIOGEL PI INDICATOR 7.0 (GLOVE) ×1
GLOVE BIOGEL PI INDICATOR 8 (GLOVE) ×2
GLOVE ECLIPSE 7.0 STRL STRAW (GLOVE) ×1 IMPLANT
GLOVE EXAM NITRILE MICROT MD (GLOVE) ×1 IMPLANT
GOWN PREVENTION PLUS XLARGE (GOWN DISPOSABLE) ×2 IMPLANT
GOWN STRL NON-REIN LRG LVL3 (GOWN DISPOSABLE) ×3 IMPLANT
KIT BASIN OR (CUSTOM PROCEDURE TRAY) ×2 IMPLANT
KIT ROOM TURNOVER OR (KITS) ×2 IMPLANT
NEEDLE 22X1 1/2 (OR ONLY) (NEEDLE) IMPLANT
NS IRRIG 1000ML POUR BTL (IV SOLUTION) ×2 IMPLANT
PACK GENERAL/GYN (CUSTOM PROCEDURE TRAY) ×2 IMPLANT
PAD ARMBOARD 7.5X6 YLW CONV (MISCELLANEOUS) ×4 IMPLANT
PLUG CATH AND CAP STER (CATHETERS) IMPLANT
SPONGE GAUZE 4X4 12PLY (GAUZE/BANDAGES/DRESSINGS) ×2 IMPLANT
STAPLER VISISTAT 35W (STAPLE) ×2 IMPLANT
SUCTION POOLE TIP (SUCTIONS) IMPLANT
SUT PDS AB 1 TP1 54 (SUTURE) ×2 IMPLANT
SUT SILK 2 0 (SUTURE) ×2
SUT SILK 2 0 SH (SUTURE) ×2 IMPLANT
SUT SILK 2 0 SH CR/8 (SUTURE) ×2 IMPLANT
SUT SILK 2-0 18XBRD TIE 12 (SUTURE) ×1 IMPLANT
SUT SILK 3 0 (SUTURE) ×2
SUT SILK 3 0 SH CR/8 (SUTURE) ×1 IMPLANT
SUT SILK 3-0 18XBRD TIE 12 (SUTURE) ×1 IMPLANT
SYR CONTROL 10ML LL (SYRINGE) IMPLANT
SYRINGE TOOMEY DISP (SYRINGE) ×2 IMPLANT
TOWEL OR 17X24 6PK STRL BLUE (TOWEL DISPOSABLE) ×2 IMPLANT
TOWEL OR 17X26 10 PK STRL BLUE (TOWEL DISPOSABLE) ×2 IMPLANT
WATER STERILE IRR 1000ML POUR (IV SOLUTION) ×2 IMPLANT

## 2011-04-16 NOTE — Progress Notes (Signed)
Patient ID: Terry Palmer, male   DOB: 04-Nov-1975, 36 y.o.   MRN: [id number]    Subjective: Pt is sleeping.  Doesn't awaken to his name.  Objective: Vital signs in last 24 hours: Temp:  [98.1 F (36.7 C)-98.3 F (36.8 C)] 98.1 F (36.7 C) (04/08 0511) Pulse Rate:  [74-91] 83  (04/08 0511) Resp:  [17-18] 17  (04/08 0511) BP: (111-125)/(76-83) 111/76 mmHg (04/08 0511) SpO2:  [98 %-99 %] 98 % (04/08 0511) Last BM Date: 04/14/11  Intake/Output from previous day: 04/07 0701 - 04/08 0700 In: 1241.3 [P.O.:145; I.V.:896.3; IV Piggyback:200] Out: 950 [Urine:950] Intake/Output this shift:    PE: Abd: soft, NT  Lab Results:  No results found for this basename: WBC:2,HGB:2,HCT:2,PLT:2 in the last 72 hours BMET  Basename 04/14/11 0500  NA 140  K 3.5  CL 110  CO2 21  GLUCOSE 103*  BUN <3*  CREATININE 0.62  CALCIUM 8.6   PT/INR No results found for this basename: LABPROT:2,INR:2 in the last 72 hours CMP     Component Value Date/Time   NA 140 04/14/2011 0500   K 3.5 04/14/2011 0500   CL 110 04/14/2011 0500   CO2 21 04/14/2011 0500   GLUCOSE 103* 04/14/2011 0500   BUN <3* 04/14/2011 0500   CREATININE 0.62 04/14/2011 0500   CALCIUM 8.6 04/14/2011 0500   PROT 6.5 03/12/2011 2054   ALBUMIN 3.1* 03/12/2011 2054   AST 23 03/12/2011 2054   ALT 18 03/12/2011 2054   ALKPHOS 98 03/12/2011 2054   BILITOT 0.4 03/12/2011 2054   GFRNONAA >90 04/14/2011 0500   GFRAA >90 04/14/2011 0500   Lipase  No results found for this basename: lipase       Studies/Results: No results found.  Anti-infectives: Anti-infectives     Start     Dose/Rate Route Frequency Ordered Stop   04/16/11 0800   cefOXitin (MEFOXIN) 1 g in dextrose 5 % 50 mL IVPB     Comments: Please give on call to OR      1 g 100 mL/hr over 30 Minutes Intravenous  Once 04/16/11 0731     04/12/11 1200   fluconazole (DIFLUCAN) IVPB 400 mg        400 mg 200 mL/hr over 60 Minutes Intravenous Every 24 hours 04/12/11 1106     04/12/11 0600    vancomycin (VANCOCIN) IVPB 1000 mg/200 mL premix  Status:  Discontinued        1,000 mg 200 mL/hr over 60 Minutes Intravenous Every 12 hours 04/11/11 1725 04/12/11 1106   04/11/11 1800   vancomycin (VANCOCIN) 1,250 mg in sodium chloride 0.9 % 250 mL IVPB        1,250 mg 166.7 mL/hr over 90 Minutes Intravenous  Once 04/11/11 1723 04/11/11 1954   04/10/11 2330   ciprofloxacin (CIPRO) IVPB 200 mg  Status:  Discontinued        200 mg 100 mL/hr over 60 Minutes Intravenous 2 times daily 04/10/11 2103 04/11/11 1613           Assessment/Plan  1. End stage MS 2. Dysphagia  Plan: 1. Plan for OR today to place an open G-tube.  Wife will have to sign consent for patient as he is unable.   LOS: 6 days    Jaelie Aguilera E 04/16/2011

## 2011-04-16 NOTE — Transfer of Care (Signed)
Immediate Anesthesia Transfer of Care Note  Patient: Terry Palmer  Procedure(s) Performed: Procedure(s) (LRB): GASTROSTOMY (N/A)  Patient Location: PACU  Anesthesia Type: General  Level of Consciousness: lethargic and responds to stimulation  Airway & Oxygen Therapy: Patient Spontanous Breathing, Patient connected to face mask oxygen and some stridor on inspiration  Post-op Assessment: Report given to PACU RN  Post vital signs: Reviewed and stable  Complications: No apparent anesthesia complications

## 2011-04-16 NOTE — Progress Notes (Signed)
Nutrition Follow-up/ Consult  Diet Order:  NPO for sx, previously D1 with pudding thick liquids.  Pt is back near baseline with mental status. Plan is for open G-Tube placement today. RD consulted for recommendations.  PO intake; pt is dependant for feedings. Intake has been variable this admission, 0-100% documented.  RD spoke with wife, states she would like nocturnal feedings and allow pt to eat as desired during the day. Agreeable to meeting 100% of needs with TF.   Meds: Scheduled Meds:   . baclofen  10 mg Oral TID  . cefOXitin  1 g Intravenous Once  . cholecalciferol  1,000 Units Oral BID  . enoxaparin  40 mg Subcutaneous Q24H  . feeding supplement  1 Container Oral TID BM  . fluconazole (DIFLUCAN) IV  400 mg Intravenous Q24H  . interferon beta-1b  0.3 mg Subcutaneous QODAY  . mulitivitamin with minerals  1 tablet Oral Daily  . omega-3 acid ethyl esters  1 g Oral Daily  . potassium chloride  40 mEq Oral Once  . senna  2 tablet Oral Daily  . sodium chloride  3 mL Intravenous Q12H  . DISCONTD: enoxaparin  40 mg Subcutaneous Q24H   Continuous Infusions:   . dextrose 5 % and 0.9 % NaCl with KCl 20 mEq/L 75 mL/hr at 04/15/11 0237   PRN Meds:.food thickener  Labs:  CMP     Component Value Date/Time   NA 140 04/14/2011 0500   K 3.5 04/14/2011 0500   CL 110 04/14/2011 0500   CO2 21 04/14/2011 0500   GLUCOSE 103* 04/14/2011 0500   BUN <3* 04/14/2011 0500   CREATININE 0.62 04/14/2011 0500   CALCIUM 8.6 04/14/2011 0500   PROT 6.5 03/12/2011 2054   ALBUMIN 3.1* 03/12/2011 2054   AST 23 03/12/2011 2054   ALT 18 03/12/2011 2054   ALKPHOS 98 03/12/2011 2054   BILITOT 0.4 03/12/2011 2054   GFRNONAA >90 04/14/2011 0500   GFRAA >90 04/14/2011 0500     Intake/Output Summary (Last 24 hours) at 04/16/11 0953 Last data filed at 04/16/11 0900  Gross per 24 hour  Intake 1216.25 ml  Output   1250 ml  Net -33.75 ml    Weight Status:  No new weights have been obtained.   Re-estimated needs:  1600-1800 kcal,  80-90 gm protein, > 1.6 L fluids.   Nutrition Dx:  Inadequate oral intake, ongoing  Goal:  PO intake will be adequate to maintain weight and promote healing of pressure ulcer, unmet  New Goal: EN and PO intake will meet 100% of estimated nutrition needs.   Intervention:   1. Recommend once G tube has been placed, starting nocturnal feedings. Goal of Osmolite 1.2 @ 105 ml/hr for 14 hours (8 pm-10 am).  2. Free water bolus 150 ml 4 times daily to meet hydration needs.  3. This will provide 1764 kcal, 82 gm protein and 1805 ml free water. This will meet 100% nutrition needs, PO intake will be comfort/pleasure only.  4. Continue with D1 diet and pudding thick liquids.   Monitor:  Tube placement, weight, labs, I/O's   Rudean Haskell Pager #:  [phone]

## 2011-04-16 NOTE — Progress Notes (Signed)
  Subjective: Arouses but unable to communicate  Objective: Vital signs in last 24 hours: Temp:  [98.1 F (36.7 C)-98.3 F (36.8 C)] 98.1 F (36.7 C) (04/08 0511) Pulse Rate:  [74-91] 83  (04/08 0511) Resp:  [17-18] 17  (04/08 0511) BP: (111-125)/(76-83) 111/76 mmHg (04/08 0511) SpO2:  [98 %-99 %] 98 % (04/08 0511) Last BM Date: 04/14/11  Intake/Output from previous day: 04/07 0701 - 04/08 0700 In: 1241.3 [P.O.:145; I.V.:896.3; IV Piggyback:200] Out: 950 [Urine:950] Intake/Output this shift:    General appearance: cooperative Resp: clear to auscultation bilaterally GI: soft, NT, ND  Lab Results:  No results found for this basename: WBC:2,HGB:2,HCT:2,PLT:2 in the last 72 hours BMET  Basename 04/14/11 0500  NA 140  K 3.5  CL 110  CO2 21  GLUCOSE 103*  BUN <3*  CREATININE 0.62  CALCIUM 8.6   PT/INR No results found for this basename: LABPROT:2,INR:2 in the last 72 hours ABG No results found for this basename: PHART:2,PCO2:2,PO2:2,HCO3:2 in the last 72 hours  Studies/Results: No results found.  Anti-infectives: Anti-infectives     Start     Dose/Rate Route Frequency Ordered Stop   04/16/11 0800   cefOXitin (MEFOXIN) 1 g in dextrose 5 % 50 mL IVPB     Comments: Please give on call to OR      1 g 100 mL/hr over 30 Minutes Intravenous  Once 04/16/11 0731     04/12/11 1200   fluconazole (DIFLUCAN) IVPB 400 mg        400 mg 200 mL/hr over 60 Minutes Intravenous Every 24 hours 04/12/11 1106     04/12/11 0600   vancomycin (VANCOCIN) IVPB 1000 mg/200 mL premix  Status:  Discontinued        1,000 mg 200 mL/hr over 60 Minutes Intravenous Every 12 hours 04/11/11 1725 04/12/11 1106   04/11/11 1800   vancomycin (VANCOCIN) 1,250 mg in sodium chloride 0.9 % 250 mL IVPB        1,250 mg 166.7 mL/hr over 90 Minutes Intravenous  Once 04/11/11 1723 04/11/11 1954   04/10/11 2330   ciprofloxacin (CIPRO) IVPB 200 mg  Status:  Discontinued        200 mg 100 mL/hr over 60  Minutes Intravenous 2 times daily 04/10/11 2103 04/11/11 1613          Assessment/Plan: s/p Procedure(s) (LRB): GASTROSTOMY (N/A) For open G-tube placement today.  Patient is agreeable but I will also speak to his wife before surgery.  I also spoke to the IM team taking care of him.  LOS: 6 days    Othman Masur E 04/16/2011  

## 2011-04-16 NOTE — Anesthesia Postprocedure Evaluation (Signed)
  Anesthesia Post-op Note  Patient: Terry Palmer  Procedure(s) Performed: Procedure(s) (LRB): GASTROSTOMY (N/A)  Patient Location: PACU  Anesthesia Type: General  Level of Consciousness: awake  Airway and Oxygen Therapy: Patient Spontanous Breathing  Post-op Pain: mild  Post-op Assessment: Post-op Vital signs reviewed, Patient's Cardiovascular Status Stable and Respiratory Function Stable  Post-op Vital Signs: stable  Complications: No apparent anesthesia complications

## 2011-04-16 NOTE — H&P (View-Only) (Signed)
  Subjective: Arouses but unable to communicate  Objective: Vital signs in last 24 hours: Temp:  [98.1 F (36.7 C)-98.3 F (36.8 C)] 98.1 F (36.7 C) (04/08 0511) Pulse Rate:  [74-91] 83  (04/08 0511) Resp:  [17-18] 17  (04/08 0511) BP: (111-125)/(76-83) 111/76 mmHg (04/08 0511) SpO2:  [98 %-99 %] 98 % (04/08 0511) Last BM Date: 04/14/11  Intake/Output from previous day: 04/07 0701 - 04/08 0700 In: 1241.3 [P.O.:145; I.V.:896.3; IV Piggyback:200] Out: 950 [Urine:950] Intake/Output this shift:    General appearance: cooperative Resp: clear to auscultation bilaterally GI: soft, NT, ND  Lab Results:  No results found for this basename: WBC:2,HGB:2,HCT:2,PLT:2 in the last 72 hours BMET  Basename 04/14/11 0500  NA 140  K 3.5  CL 110  CO2 21  GLUCOSE 103*  BUN <3*  CREATININE 0.62  CALCIUM 8.6   PT/INR No results found for this basename: LABPROT:2,INR:2 in the last 72 hours ABG No results found for this basename: PHART:2,PCO2:2,PO2:2,HCO3:2 in the last 72 hours  Studies/Results: No results found.  Anti-infectives: Anti-infectives     Start     Dose/Rate Route Frequency Ordered Stop   04/16/11 0800   cefOXitin (MEFOXIN) 1 g in dextrose 5 % 50 mL IVPB     Comments: Please give on call to OR      1 g 100 mL/hr over 30 Minutes Intravenous  Once 04/16/11 0731     04/12/11 1200   fluconazole (DIFLUCAN) IVPB 400 mg        400 mg 200 mL/hr over 60 Minutes Intravenous Every 24 hours 04/12/11 1106     04/12/11 0600   vancomycin (VANCOCIN) IVPB 1000 mg/200 mL premix  Status:  Discontinued        1,000 mg 200 mL/hr over 60 Minutes Intravenous Every 12 hours 04/11/11 1725 04/12/11 1106   04/11/11 1800   vancomycin (VANCOCIN) 1,250 mg in sodium chloride 0.9 % 250 mL IVPB        1,250 mg 166.7 mL/hr over 90 Minutes Intravenous  Once 04/11/11 1723 04/11/11 1954   04/10/11 2330   ciprofloxacin (CIPRO) IVPB 200 mg  Status:  Discontinued        200 mg 100 mL/hr over 60  Minutes Intravenous 2 times daily 04/10/11 2103 04/11/11 1613          Assessment/Plan: s/p Procedure(s) (LRB): GASTROSTOMY (N/A) For open G-tube placement today.  Patient is agreeable but I will also speak to his wife before surgery.  I also spoke to the IM team taking care of him.  LOS: 6 days    Akhilesh Sassone E 04/16/2011

## 2011-04-16 NOTE — Progress Notes (Signed)
Patient ID: AVETT REINECK, male   DOB: [birth year], 36 y.o.   MRN: [id number]   PATIENT DETAILS Name: DAVIONTE LUSBY Age: 36 y.o. Sex: male Date of Birth: Oct 23, 1975 Admit Date: 04/10/2011 [name],[name], MD, MD POA:   CONSULTS: [name]  Subjective: No complaints.  Wife and Mother at bedside.  Objective: Vital signs in last 24 hours: Temp:  [98.1 F (36.7 C)-98.3 F (36.8 C)] 98.1 F (36.7 C) (04/08 0511) Pulse Rate:  [74-91] 83  (04/08 0511) Resp:  [17-18] 17  (04/08 0511) BP: (111-125)/(76-83) 111/76 mmHg (04/08 0511) SpO2:  [98 %-99 %] 98 % (04/08 0511) Weight change:  Last BM Date: 04/14/11  Intake/Output from previous day:  Intake/Output Summary (Last 24 hours) at 04/16/11 0728 Last data filed at 04/16/11 0100  Gross per 24 hour  Intake 1241.25 ml  Output    950 ml  Net 291.25 ml     Physical Exam:  Gen:  Chronically weak appearing, opening eyes to verbal stimuli, nods yes and no to my questions. HEENT:  Eyes with slow nystagmus Cardiovascular:  S1S2 RRR, no m/r/g Respiratory: CTAB, no w/r/c, no increased wob Gastrointestinal: abdomen flat, soft, NT/ND, BS+ Extremities: contracted LE's. No c/ce GU: foley draining clear, yellow urine.    Lab Results:  Lab 04/13/11 0700 04/12/11 0537 04/10/11 2116  HGB 10.9* 11.0* 11.8*  HCT 32.5* 33.5* 35.3*  WBC 7.5 10.0 11.1*  PLT 254 228 285     Lab 04/14/11 0500 04/13/11 0700 04/12/11 0537 04/10/11 2116 04/10/11 1700  NA 140 138 136 -- 141  K 3.5 3.2* -- -- --  CL 110 106 106 -- 104  CO2 21 20 20  -- 25  GLUCOSE 103* 98 105* -- 102*  BUN <3* 3* 3* -- 12  CREATININE 0.62 0.66 0.64 0.75 0.93  CALCIUM 8.6 8.4 8.4 -- 9.3  MG -- -- -- -- --  PHOS -- -- -- -- --    Studies/Results: No results found.  Medications: Scheduled Meds:    . baclofen  10 mg Oral TID  . cholecalciferol  1,000 Units Oral BID  . enoxaparin  40 mg Subcutaneous Q24H  . feeding supplement  1 Container Oral  TID BM  . fluconazole (DIFLUCAN) IV  400 mg Intravenous Q24H  . interferon beta-1b  0.3 mg Subcutaneous QODAY  . mulitivitamin with minerals  1 tablet Oral Daily  . omega-3 acid ethyl esters  1 g Oral Daily  . potassium chloride  40 mEq Oral Once  . senna  2 tablet Oral Daily  . sodium chloride  3 mL Intravenous Q12H   Continuous Infusions:    . dextrose 5 % and 0.9 % NaCl with KCl 20 mEq/L 75 mL/hr at 04/15/11 0237   PRN Meds:.food thickener Antibiotics: Anti-infectives     Start     Dose/Rate Route Frequency Ordered Stop   04/12/11 1200   fluconazole (DIFLUCAN) IVPB 400 mg        400 mg 200 mL/hr over 60 Minutes Intravenous Every 24 hours 04/12/11 1106     04/12/11 0600   vancomycin (VANCOCIN) IVPB 1000 mg/200 mL premix  Status:  Discontinued        1,000 mg 200 mL/hr over 60 Minutes Intravenous Every 12 hours 04/11/11 1725 04/12/11 1106   04/11/11 1800   vancomycin (VANCOCIN) 1,250 mg in sodium chloride 0.9 % 250 mL IVPB        1,250 mg 166.7 mL/hr over 90 Minutes Intravenous  Once 04/11/11 1723 04/11/11 1954   04/10/11 2330   ciprofloxacin (CIPRO) IVPB 200 mg  Status:  Discontinued        200 mg 100 mL/hr over 60 Minutes Intravenous 2 times daily 04/10/11 2103 04/11/11 1613           Assessment/Plan: Patient with end stage multiple sclerosis and quadriplegia. Has recurrent urinary tract infections secondary to bladder stones. These urinary tract infections cause altered mental status. Previously in March his urinary tract infection was enterococcus sensitive to vancomycin. Of note the family has refused hospice services and palliative care consult in the past. Further he has a history of aspiration.  GI and IR are unable to safely place gastrostomy tube.  The patient went to the Operating Room 4/8 for gastrostomy tube placement.   The patient is a full code.   1. Yeast UTI:   -This is indwelling Foley catheter associated UTI, patient also has bladder stones increases  his risks. -Start Diflucan 4/4. Ok to d/c vancomycin. Prophylactic macrobid as pta   2. Acute encephalopathy: - Resolved Wife describes baseline as blinking once for yes or twice no.  Or patient mouths words but rarely speaks. CT head on admit with no acute findings.   3. Hypokalemia, mild: replete with IVFs and potassium liquid.  4. End-stage multiple sclerosis: family refused hospice and revoked DNR during recent hospitalization. They did undergo a palliative medicine consult on 03/17/11. They are of muslim faith and are receiving guidance from their Imam.   5. Chronic dysphagia with aspiration (family is aware he aspirates) -Gen. surgery is following. -Gastrostomy tube.  Patient to the O.R. Today (4/8). -Nutrition consultation requested for PEG feedings and free water recommendations.  6. DVT prophylaxis: on Lovenox  Disposition:  Hopefully home 4/9 or 4/10.  Algis Downs, PA-C [facility] Pager: [phone]  04/16/2011, 7:28 AM     LOS: 6 days

## 2011-04-16 NOTE — Anesthesia Procedure Notes (Signed)
Procedure Name: Intubation Date/Time: 04/16/2011 1:29 PM Performed by: Jefm Miles E Pre-anesthesia Checklist: Patient identified, Emergency Drugs available, Suction available, Patient being monitored and Timeout performed Patient Re-evaluated:Patient Re-evaluated prior to inductionOxygen Delivery Method: Circle system utilized Preoxygenation: Pre-oxygenation with 100% oxygen Intubation Type: IV induction Ventilation: Mask ventilation without difficulty Laryngoscope Size: Mac and 4 Grade View: Grade I Tube type: Oral Tube size: 7.5 mm Number of attempts: 1 Airway Equipment and Method: Stylet Placement Confirmation: ETT inserted through vocal cords under direct vision,  breath sounds checked- equal and bilateral and positive ETCO2 Secured at: 23 cm Tube secured with: Tape Dental Injury: Teeth and Oropharynx as per pre-operative assessment

## 2011-04-16 NOTE — Progress Notes (Signed)
Addendum  Patient seen and examined, chart and data base reviewed.  I agree with the above assessment and plan  For full details please see Mrs. Algis Downs PA. Note.  Gastrostomy tube placed today by general surgery.  He will be appropriate for discharge in the morning if it's okay with general surgery.  Clint Lipps Pager: [phone] 04/16/2011, 3:20 PM

## 2011-04-16 NOTE — Op Note (Signed)
04/10/2011 - 04/16/2011  2:16 PM  PATIENT:  Terry Palmer  36 y.o. male  PRE-OPERATIVE DIAGNOSIS:  dysphasia  POST-OPERATIVE DIAGNOSIS:  Dysphasia   PROCEDURE:  Procedure(s): GASTROSTOMY  SURGEON:  Surgeon(s): Liz Malady, MD  PHYSICIAN ASSISTANT: Jettie Pagan, PA-C  ASSISTANTS:   ANESTHESIA:   general  EBL:  Total I/O In: 0  Out: 310 [Urine:300; Blood:10]  BLOOD ADMINISTERED:none  DRAINS: none   SPECIMEN:  No Specimen  DISPOSITION OF SPECIMEN:  N/A  COUNTS:  YES  DICTATION: .Dragon Dictation patient has end-stage multiple sclerosis and significant dysphagia. Family has decided on feeding tube placement. This was unable to be done in interventional radiology. He is brought for open gastrostomy tube placement today. The patient was identified in the preop holding area. Informed consent was obtained from his wife. He received intravenous antibiotics. He was brought to the operating room and general endotracheal anesthesia was administered by the anesthesia staff. His abdomen was prepped and draped in a sterile fashion. We did a time out procedure. Upper midline incision was made. Subcutaneous tissues were dissected down revealing the anterior fascia. This was divided sharply along the midline. Peritoneal cavity was entered under direct vision without difficulty. The stomach was visualized. It reached down to an area in the left upper quadrant a couple centimeters below the costal margin. This site was selected. 2 concentric pursestring 2-0 silk sutures were placed on the stomach body. The 24 French Malecot tube was brought to the small skin incision in the left upper quadrant into the abdomen. A small gastrotomy was made in the center of the pursestring sutures. The Malecot was inserted into the stomach without difficulty. The inner followed by the outer pursestring suture was tied leaving the needle in place. The tube was gently withdrawn from the skin level until the stomach  was flush up against the abdominal wall. Next these 2 sutures that were left in place were used to tack the stomach up to the anterior abdominal wall. Next multiple 2-0 silk sutures were used to circumferentially tack the stomach up to the anterior abdominal wall. The tube is not visible at the completion of this. Tube was secured to the skin with a 2-0 silk suture. It was flushed with saline and this flowed easily without leakage. There was good hemostasis. The fascia was closed with 2 lengths of running #1 PDS tied in the middle. Skin was irrigated thoroughly and closed with staples. The G-tube was hooked up to a Foley bag for drainage. All counts were correct. A sterile dressing was applied. There were no apparent complications. Patient tolerated the procedure well and was taken recovery room in stable condition.  PATIENT DISPOSITION:  PACU - hemodynamically stable.   Delay start of Pharmacological VTE agent (>24hrs) due to surgical blood loss or risk of bleeding:  no  Violeta Gelinas, MD, MPH, FACS Pager: [phone]  4/8/20132:16 PM

## 2011-04-16 NOTE — Preoperative (Signed)
Beta Blockers   Reason not to administer Beta Blockers:Not Applicable 

## 2011-04-16 NOTE — Interval H&P Note (Signed)
History and Physical Interval Note:  04/16/2011 11:29 AM  Terry Palmer  has presented today for surgery, with the diagnosis of dehydration  The various methods of treatment have been discussed with the patient and family. After consideration of risks, benefits and other options for treatment, the patient has consented to  Procedure(s) (LRB): GASTROSTOMY (N/A) as a surgical intervention .  [name] history has been reviewed, patient re-examined, no change in status, stable for surgery.  I have reviewed the patients' chart and labs.  Questions were answered to the patient's satisfaction.    I also spoke to the patient's wife at the bedside.  Consent obtained and questions answered. Terry Palmer E

## 2011-04-16 NOTE — Anesthesia Preprocedure Evaluation (Signed)
Anesthesia Evaluation  Patient identified by MRN, date of birth, ID band Patient confused    Reviewed: Allergy & Precautions, H&P , NPO status , Patient's Chart, lab work & pertinent test results  Airway Mallampati: I TM Distance: >3 FB Neck ROM: full    Dental   Pulmonary asthma ,          Cardiovascular Rhythm:regular Rate:Normal     Neuro/Psych PSYCHIATRIC DISORDERS  Neuromuscular disease    GI/Hepatic   Endo/Other    Renal/GU      Musculoskeletal   Abdominal   Peds  Hematology   Anesthesia Other Findings   Reproductive/Obstetrics                           Anesthesia Physical Anesthesia Plan  ASA: IV  Anesthesia Plan: General   Post-op Pain Management:    Induction: Intravenous  Airway Management Planned: Oral ETT  Additional Equipment:   Intra-op Plan:   Post-operative Plan: Extubation in OR  Informed Consent: I have reviewed the patients History and Physical, chart, labs and discussed the procedure including the risks, benefits and alternatives for the proposed anesthesia with the patient or authorized representative who has indicated his/her understanding and acceptance.     Plan Discussed with: CRNA, Anesthesiologist and Surgeon  Anesthesia Plan Comments:         Anesthesia Quick Evaluation

## 2011-04-16 NOTE — Progress Notes (Signed)
See my note Violeta Gelinas, MD, MPH, FACS Pager: [phone]

## 2011-04-17 ENCOUNTER — Encounter (HOSPITAL_COMMUNITY): Payer: Self-pay | Admitting: General Surgery

## 2011-04-17 DIAGNOSIS — E46 Unspecified protein-calorie malnutrition: Secondary | ICD-10-CM

## 2011-04-17 LAB — CBC
HCT: 34.8 % — ABNORMAL LOW (ref 39.0–52.0)
Hemoglobin: 11.8 g/dL — ABNORMAL LOW (ref 13.0–17.0)
MCH: 28.1 pg (ref 26.0–34.0)
MCHC: 33.9 g/dL (ref 30.0–36.0)
MCV: 82.9 fL (ref 78.0–100.0)
Platelets: 381 10*3/uL (ref 150–400)
RBC: 4.2 MIL/uL — ABNORMAL LOW (ref 4.22–5.81)
RDW: 14.4 % (ref 11.5–15.5)
WBC: 10.7 10*3/uL — ABNORMAL HIGH (ref 4.0–10.5)

## 2011-04-17 LAB — BASIC METABOLIC PANEL
BUN: 3 mg/dL — ABNORMAL LOW (ref 6–23)
CO2: 23 mEq/L (ref 19–32)
Chloride: 103 mEq/L (ref 96–112)
Creatinine, Ser: 0.81 mg/dL (ref 0.50–1.35)

## 2011-04-17 LAB — URINALYSIS, ROUTINE W REFLEX MICROSCOPIC
Glucose, UA: NEGATIVE mg/dL
Hgb urine dipstick: NEGATIVE
Ketones, ur: NEGATIVE mg/dL
Protein, ur: NEGATIVE mg/dL
Urobilinogen, UA: 0.2 mg/dL (ref 0.0–1.0)

## 2011-04-17 LAB — URINE MICROSCOPIC-ADD ON

## 2011-04-17 MED ORDER — ACETAMINOPHEN 325 MG PO TABS
650.0000 mg | ORAL_TABLET | Freq: Four times a day (QID) | ORAL | Status: DC | PRN
  Administered 2011-04-18: 650 mg via ORAL
  Filled 2011-04-17: qty 2

## 2011-04-17 MED ORDER — OSMOLITE 1.2 CAL PO LIQD
1000.0000 mL | ORAL | Status: DC
  Administered 2011-04-17: 1000 mL
  Filled 2011-04-17 (×5): qty 1000

## 2011-04-17 NOTE — Plan of Care (Signed)
Problem: Phase II Progression Outcomes Goal: Discharge plan established Outcome: Completed/Met Date Met:  04/17/11 To go home with family and with home health assistance.

## 2011-04-17 NOTE — Progress Notes (Signed)
Nutrition Follow-up/Consult Pt had G tube placed yesterday. Consulted to start TF today. Per PA, unsure if pt will have nocturnal TF or continuous when he goes home. Will start TF now and hopefully get to goal prior to D/C. RD will provide recommendations for both nocturnal and continuous, see intervention at bottom of note.  Pt with fevers overnight. Tmax- 101 F.   Diet Order:  NPO  Meds: Scheduled Meds:   . baclofen  10 mg Oral TID  . cholecalciferol  1,000 Units Oral BID  . enoxaparin  40 mg Subcutaneous Q24H  . feeding supplement  1 Container Oral TID BM  . fluconazole (DIFLUCAN) IV  400 mg Intravenous Q24H  . interferon beta-1b  0.3 mg Subcutaneous QODAY  . mulitivitamin with minerals  1 tablet Oral Daily  . omega-3 acid ethyl esters  1 g Oral Daily  . senna  2 tablet Oral Daily  . sodium chloride  3 mL Intravenous Q12H  . DISCONTD: enoxaparin  40 mg Subcutaneous Q24H   Continuous Infusions:   . dextrose 5 % and 0.9 % NaCl with KCl 20 mEq/L 75 mL/hr at 04/17/11 0707  . lactated ringers 50 mL/hr at 04/16/11 1227   PRN Meds:.food thickener, HYDROcodone-acetaminophen, morphine injection, DISCONTD: 0.9 % irrigation (POUR BTL), DISCONTD: HYDROmorphone, DISCONTD: ondansetron (ZOFRAN) IV  Labs:  CMP     Component Value Date/Time   NA 135 04/17/2011 0645   K 4.3 04/17/2011 0645   CL 103 04/17/2011 0645   CO2 23 04/17/2011 0645   GLUCOSE 103* 04/17/2011 0645   BUN 3* 04/17/2011 0645   CREATININE 0.81 04/17/2011 0645   CALCIUM 9.1 04/17/2011 0645   PROT 6.5 03/12/2011 2054   ALBUMIN 3.1* 03/12/2011 2054   AST 23 03/12/2011 2054   ALT 18 03/12/2011 2054   ALKPHOS 98 03/12/2011 2054   BILITOT 0.4 03/12/2011 2054   GFRNONAA >90 04/17/2011 0645   GFRAA >90 04/17/2011 0645     Intake/Output Summary (Last 24 hours) at 04/17/11 1128 Last data filed at 04/17/11 1112  Gross per 24 hour  Intake    763 ml  Output    460 ml  Net    303 ml    Weight Status:  No new weights  Re-estimated needs:  1700-1900  kcal, 80-90 gm protein  Nutrition Dx:  Inadequate oral intake, ongoing   Goal:  EN and PO intake will meet 100% of estimated nutrition needs, currently unmet  Intervention:   1. Initiate Osmolite 1.2 at 15 ml/hr and advance by 10 ml q 4 hours as tolerated to a goal rate of 65 ml/hr. This will provide 1872 kcal, 87 gm protein, and 1279 ml free water. This meet 100% of estimated kcal and protein needs. Hydration needs being met through IV fluids.  2. When pt returns home, RD recommends pt be on a nocturnal feeding regimen that meet >90% of estimated needs. Recommend using Osmolite 1.5 at a rate of 85 ml/hr over 14 hours (8 pm-10 am) via G tube. This will provide pt with 1785 kcal (105%), 75 gm protein (93%), and 906 ml free water.   3.Pt will needs additional free water to meet hydration needs at home, recommend 150 ml water bolus 6 times daily.   Monitor:  TF Rate/tolerance, PO intake, weight, labs, I/O's   Rudean Haskell Pager #:  [phone]

## 2011-04-17 NOTE — Progress Notes (Signed)
Addendum  Patient seen and examined, chart and data base reviewed.  I agree with the above assessment and plan  For full details please see Mrs. Algis Downs PA. Note.  Gastrostomy tube placed by Gen. surgery yesterday.  Low-grade fever, will not start antibiotic observe for now. If afebrile can be discharged in the morning.  Clint Lipps Pager: [phone] 04/17/2011, 5:02 PM

## 2011-04-17 NOTE — Progress Notes (Signed)
Utilization review completed.  

## 2011-04-17 NOTE — Progress Notes (Signed)
Patient ID: Terry Palmer, male   DOB: 1975-08-10, 36 y.o.   MRN: [id number] 1 Day Post-Op  Subjective: Pt nonverbal this morning.  Objective: Vital signs in last 24 hours: Temp:  [97.3 F (36.3 C)-101 F (38.3 C)] 99.9 F (37.7 C) (04/09 0500) Pulse Rate:  [69-112] 112  (04/09 0444) Resp:  [11-18] 18  (04/09 0444) BP: (119-133)/(70-87) 132/70 mmHg (04/09 0444) SpO2:  [96 %-100 %] 96 % (04/09 0444) Last BM Date: 04/16/11  Intake/Output from previous day: 04/08 0701 - 04/09 0700 In: 760 [I.V.:760] Out: 760 [Urine:750; Blood:10] Intake/Output this shift:    PE: Abd: soft, g tube in place, incision c/d/i  Lab Results:   Basename 04/17/11 0645 04/16/11 1840  WBC 10.7* 13.4*  HGB 11.8* 12.3*  HCT 34.8* 36.5*  PLT 381 368   BMET  Basename 04/17/11 0645 04/16/11 1840  NA 135 --  K 4.3 --  CL 103 --  CO2 23 --  GLUCOSE 103* --  BUN 3* --  CREATININE 0.81 0.64  CALCIUM 9.1 --   PT/INR No results found for this basename: LABPROT:2,INR:2 in the last 72 hours CMP     Component Value Date/Time   NA 135 04/17/2011 0645   K 4.3 04/17/2011 0645   CL 103 04/17/2011 0645   CO2 23 04/17/2011 0645   GLUCOSE 103* 04/17/2011 0645   BUN 3* 04/17/2011 0645   CREATININE 0.81 04/17/2011 0645   CALCIUM 9.1 04/17/2011 0645   PROT 6.5 03/12/2011 2054   ALBUMIN 3.1* 03/12/2011 2054   AST 23 03/12/2011 2054   ALT 18 03/12/2011 2054   ALKPHOS 98 03/12/2011 2054   BILITOT 0.4 03/12/2011 2054   GFRNONAA >90 04/17/2011 0645   GFRAA >90 04/17/2011 0645   Lipase  No results found for this basename: lipase       Studies/Results: No results found.  Anti-infectives: Anti-infectives     Start     Dose/Rate Route Frequency Ordered Stop   04/16/11 0800   cefOXitin (MEFOXIN) 1 g in dextrose 5 % 50 mL IVPB     Comments: Please give on call to OR      1 g 100 mL/hr over 30 Minutes Intravenous  Once 04/16/11 0731 04/16/11 0830   04/12/11 1200   fluconazole (DIFLUCAN) IVPB 400 mg        400 mg 200 mL/hr  over 60 Minutes Intravenous Every 24 hours 04/12/11 1106     04/12/11 0600   vancomycin (VANCOCIN) IVPB 1000 mg/200 mL premix  Status:  Discontinued        1,000 mg 200 mL/hr over 60 Minutes Intravenous Every 12 hours 04/11/11 1725 04/12/11 1106   04/11/11 1800   vancomycin (VANCOCIN) 1,250 mg in sodium chloride 0.9 % 250 mL IVPB        1,250 mg 166.7 mL/hr over 90 Minutes Intravenous  Once 04/11/11 1723 04/11/11 1954   04/10/11 2330   ciprofloxacin (CIPRO) IVPB 200 mg  Status:  Discontinued        200 mg 100 mL/hr over 60 Minutes Intravenous 2 times daily 04/10/11 2103 04/11/11 1613           Assessment/Plan  1. Dysphagia 2. MS 3.s/p placement of open g tube  Plan: 1. Will have nutrition start TF today and advance as they see fit.   LOS: 7 days    Reighn Kaplan E 04/17/2011

## 2011-04-17 NOTE — Progress Notes (Signed)
Patient ID: Terry Palmer, male   DOB: 13-Mar-1975, 36 y.o.   MRN: [id number]   PATIENT DETAILS Name: Terry Palmer Age: 36 y.o. Sex: male Date of Birth: 08-29-1975 Admit Date: 04/10/2011 AOZ:[id number],IONGE A, MD, MD POA:   CONSULTS: [facility]  Subjective: Patient grunts in apparent pain.  Unable to convey what is bothering him.  Objective: Vital signs in last 24 hours: Temp:  [97.3 F (36.3 C)-101 F (38.3 C)] 99.9 F (37.7 C) (04/09 0500) Pulse Rate:  [69-112] 112  (04/09 0444) Resp:  [11-18] 18  (04/09 0444) BP: (119-133)/(70-87) 132/70 mmHg (04/09 0444) SpO2:  [96 %-100 %] 96 % (04/09 0444) Weight change:  Last BM Date: 04/16/11  Intake/Output from previous day:  Intake/Output Summary (Last 24 hours) at 04/17/11 1216 Last data filed at 04/17/11 1112  Gross per 24 hour  Intake    763 ml  Output    460 ml  Net    303 ml     Physical Exam:  Gen:  Chronically weak appearing, Eyes open, slightly diaphoretic HEENT:  Eyes with slow nystagmus Cardiovascular:  S1S2 RRR, no m/r/g Respiratory: CTAB, no w/r/c, no increased wob Gastrointestinal: abdomen flat, Gastrostomy in place.  Patient tender to palpation. Extremities: contracted LE's. No c/ce GU: foley draining clear, yellow urine.    Lab Results:  Lab 04/17/11 0645 04/16/11 1840 04/13/11 0700  HGB 11.8* 12.3* 10.9*  HCT 34.8* 36.5* 32.5*  WBC 10.7* 13.4* 7.5  PLT 381 368 254     Lab 04/17/11 0645 04/16/11 1840 04/14/11 0500 04/13/11 0700 04/12/11 0537 04/10/11 1700  NA 135 -- 140 138 136 141  K 4.3 -- 3.5 -- -- --  CL 103 -- 110 106 106 104  CO2 23 -- 21 20 20 25   GLUCOSE 103* -- 103* 98 105* 102*  BUN 3* -- <3* 3* 3* 12  CREATININE 0.81 0.64 0.62 0.66 0.64 --  CALCIUM 9.1 -- 8.6 8.4 8.4 9.3  MG -- -- -- -- -- --  PHOS -- -- -- -- -- --    Studies/Results: No results found.  Medications: Scheduled Meds:    . baclofen  10 mg Oral TID  . cholecalciferol  1,000 Units Oral BID   . enoxaparin  40 mg Subcutaneous Q24H  . fluconazole (DIFLUCAN) IV  400 mg Intravenous Q24H  . interferon beta-1b  0.3 mg Subcutaneous QODAY  . mulitivitamin with minerals  1 tablet Oral Daily  . omega-3 acid ethyl esters  1 g Oral Daily  . senna  2 tablet Oral Daily  . sodium chloride  3 mL Intravenous Q12H  . DISCONTD: enoxaparin  40 mg Subcutaneous Q24H  . DISCONTD: feeding supplement  1 Container Oral TID BM   Continuous Infusions:    . dextrose 5 % and 0.9 % NaCl with KCl 20 mEq/L 75 mL/hr at 04/17/11 0707  . feeding supplement (OSMOLITE 1.2 CAL)    . lactated ringers 50 mL/hr at 04/16/11 1227   PRN Meds:.food thickener, HYDROcodone-acetaminophen, morphine injection, DISCONTD: 0.9 % irrigation (POUR BTL), DISCONTD: HYDROmorphone, DISCONTD: ondansetron (ZOFRAN) IV Antibiotics: Anti-infectives     Start     Dose/Rate Route Frequency Ordered Stop   04/16/11 0800   cefOXitin (MEFOXIN) 1 g in dextrose 5 % 50 mL IVPB     Comments: Please give on call to OR      1 g 100 mL/hr over 30 Minutes Intravenous  Once 04/16/11 0731 04/16/11 0830   04/12/11 1200  fluconazole (DIFLUCAN) IVPB 400 mg        400 mg 200 mL/hr over 60 Minutes Intravenous Every 24 hours 04/12/11 1106     04/12/11 0600   vancomycin (VANCOCIN) IVPB 1000 mg/200 mL premix  Status:  Discontinued        1,000 mg 200 mL/hr over 60 Minutes Intravenous Every 12 hours 04/11/11 1725 04/12/11 1106   04/11/11 1800   vancomycin (VANCOCIN) 1,250 mg in sodium chloride 0.9 % 250 mL IVPB        1,250 mg 166.7 mL/hr over 90 Minutes Intravenous  Once 04/11/11 1723 04/11/11 1954   04/10/11 2330   ciprofloxacin (CIPRO) IVPB 200 mg  Status:  Discontinued        200 mg 100 mL/hr over 60 Minutes Intravenous 2 times daily 04/10/11 2103 04/11/11 1613           Assessment/Plan: Patient with end stage multiple sclerosis and quadriplegia. Has recurrent urinary tract infections secondary to bladder stones. These urinary tract  infections cause altered mental status. Previously in March his urinary tract infection was enterococcus sensitive to vancomycin. Of note the family has refused hospice services and palliative care consult in the past. Further he has a history of aspiration.  GI and IR are unable to safely place gastrostomy tube.  The patient went to the Operating Room 4/8 for gastrostomy tube placement.   The patient is a full code.    Yeast UTI:   -This is indwelling Foley catheter associated UTI, patient also has bladder stones increases his risks. -Start Diflucan 4/4. Ok to d/c vancomycin. Prophylactic macrobid as pta   Acute encephalopathy: - Resolved Wife describes baseline as blinking once for yes or twice no.  Or patient mouths words but rarely speaks. CT head on admit with no acute findings.   Hypokalemia, mild: replete with IVFs and potassium liquid.  End-stage multiple sclerosis: family refused hospice and revoked DNR during recent hospitalization. They did undergo a palliative medicine consult on 03/17/11. They are of muslim faith and are receiving guidance from their Imam.   Chronic dysphagia with aspiration (family is aware he aspirates) -Gen. surgery is following. -Gastrostomy tube placed 4/8. -continuous peg feedings started.  Appreciate nutrition recs.  Will check residuals q 6 hours.  Fever. (101 on 4/9) Possibly reaction to gastrostomy placement.  Will monitor.  Check u/a.   DVT prophylaxis: on Lovenox  Disposition:  Hopefully home 4/10.  Terry Downs, PA-C [facility] Pager: [phone]  04/17/2011, 12:16 PM     LOS: 7 days

## 2011-04-17 NOTE — Progress Notes (Signed)
Plan to start TF I spoke to his family Patient examined and I agree with the assessment and plan  Violeta Gelinas, MD, MPH, FACS Pager: [phone]  04/17/2011 3:07 PM

## 2011-04-18 LAB — CBC
Hemoglobin: 11.8 g/dL — ABNORMAL LOW (ref 13.0–17.0)
MCH: 28.2 pg (ref 26.0–34.0)
MCHC: 33.4 g/dL (ref 30.0–36.0)
MCV: 84.4 fL (ref 78.0–100.0)
RBC: 4.18 MIL/uL — ABNORMAL LOW (ref 4.22–5.81)

## 2011-04-18 MED ORDER — OSMOLITE 1.5 CAL PO LIQD
1000.0000 mL | ORAL | Status: DC
  Administered 2011-04-18 – 2011-04-19 (×2): 1000 mL
  Filled 2011-04-18 (×5): qty 1000

## 2011-04-18 MED ORDER — FREE WATER
150.0000 mL | Freq: Every day | Status: DC
  Administered 2011-04-18 – 2011-04-19 (×7): 150 mL

## 2011-04-18 MED ORDER — WHITE PETROLATUM GEL
Status: AC
  Administered 2011-04-18: 16:00:00
  Filled 2011-04-18: qty 5

## 2011-04-18 NOTE — Progress Notes (Signed)
Patient examined and I agree with the assessment and plan  Alazia Crocket, MD, MPH, FACS Pager: [phone]  04/18/2011 5:11 PM  

## 2011-04-18 NOTE — Progress Notes (Signed)
   CARE MANAGEMENT NOTE 04/18/2011  Patient:  Terry Palmer, Terry Palmer   Account Number:  [id number]  Date Initiated:  04/11/2011  Documentation initiated by:  Donn Pierini  Subjective/Objective Assessment:   Pt admitted with encephalopathy     Action/Plan:   PTA pt was living at home with wife, assisted with ADLs- had HH with [facility]   Anticipated DC Date:  04/17/2011   Anticipated DC Plan:  HOME W HOME HEALTH SERVICES      DC Planning Services  CM consult      [facility] Choice  Resumption Of Svcs/PTA Provider   Choice offered to / List presented to:  C-3 Spouse   DME arranged  TUBE FEEDING  TUBE FEEDING PUMP      DME agency  [facility]     [facility] arranged  HH-1 RN  HH-2 PT  HH-3 OT  HH-4 NURSE'S AIDE  HH-5 SPEECH THERAPY  HH-6 SOCIAL WORKER      HH agency  [facility]   Status of service:  Completed, signed off Medicare Important Message given?   (If response is "NO", the following Medicare IM given date fields will be blank) Date Medicare IM given:   Date Additional Medicare IM given:    Discharge Disposition:  HOME W HOME HEALTH SERVICES  Per UR Regulation:    If discussed at Long Length of Stay Meetings, dates discussed:   04/18/2011    Comments:  PCP- [place]  04/18/11 10:53  Letha Cape RN, BSN [phone] patient for dc today, will resume hh services with [facility], informed [facility] with [facility] that patient is for dc today, she will be here around 1:30 pm.  Patient will need ambulance transport at dc.  Informed patient's wife that pt will be dc today, she will be here shortly.  04/17/11- 1150- Donn Pierini RN, BSN [phone] Pt s/p peg tub placement- to start tube feeds today- plan to d/c home with [facility]- when tolerating tube feeds.  04/13/11 15:37 Letha Cape RN, BSN [phone] patient will have the surgery for the peg tube on Monday, Patient will resume services with [facility], PT,OT, St, aide and Arts development officer.  Referral made to [facility] with [facility], Corrie Dandy will come by hospital on Monday to get orders.  04/12/11 15:50 Letha Cape RN, BSN [phone] patient has poor po intake, just started on Diflucan, was on Vanc iv and cipro iv but dc'd.  Will need resumption of [facility] services for RN,PT, OT, ST, aide and CSW when ready for dc.  04/11/11- 1200- Donn Pierini RN, BSN [phone] Pt was recently discharged home with [facility] services through [facility] for HH-RN/PT/OT/ST/aide/CSW. Pt has a hospital bed and air overlay at home. Arrangements were also made for pt to be followed at home by Dr. Florentina Jenny. Will need resumption orders for [facility] at time of discharge. CM to follow

## 2011-04-18 NOTE — Progress Notes (Signed)
Patient ID: Terry Palmer, male   DOB: January 01, 1976, 36 y.o.   MRN: [id number]   PATIENT DETAILS Name: Terry Palmer Age: 36 y.o. Sex: male Date of Birth: 18-Oct-1975 Admit Date: 04/10/2011 [id number] A, MD, MD POA:   CONSULTS: [facility]  Subjective: Patient non-verbal.  Eyes open, tries to follow commands  Objective: Vital signs in last 24 hours: Temp:  [98.1 F (36.7 C)-98.9 F (37.2 C)] 98.5 F (36.9 C) (04/10 0517) Pulse Rate:  [102-116] 102  (04/10 0517) Resp:  [18-20] 20  (04/10 0517) BP: (103-127)/(57-80) 111/80 mmHg (04/10 0517) SpO2:  [96 %-100 %] 97 % (04/10 0517) Weight:  [73 kg (160 lb 15 oz)] 73 kg (160 lb 15 oz) (04/10 0517) Weight change:  Last BM Date: 04/17/11  Intake/Output from previous day:  Intake/Output Summary (Last 24 hours) at 04/18/11 1317 Last data filed at 04/18/11 0500  Gross per 24 hour  Intake   1710 ml  Output   1000 ml  Net    710 ml     Physical Exam:  Gen:  Chronically weak appearing, Eyes open,  HEENT:  Eyes with slow nystagmus Cardiovascular:  S1S2 tachycardic (102 - 116), no m/r/g Respiratory: CTAB, no w/r/c, no increased wob Gastrointestinal: abdomen flat, Gastrostomy in place.  + BS.  Peg site with no infection.  Patient tender to palpation. Extremities: contracted LE's. No c/ce.  Legs with more spastic movement. GU: foley draining clear, yellow urine.    Lab Results:  Lab 04/18/11 0700 04/17/11 0645 04/16/11 1840  HGB 11.8* 11.8* 12.3*  HCT 35.3* 34.8* 36.5*  WBC 13.1* 10.7* 13.4*  PLT 368 381 368     Lab 04/17/11 0645 04/16/11 1840 04/14/11 0500 04/13/11 0700 04/12/11 0537  NA 135 -- 140 138 136  K 4.3 -- 3.5 -- --  CL 103 -- 110 106 106  CO2 23 -- 21 20 20   GLUCOSE 103* -- 103* 98 105*  BUN 3* -- <3* 3* 3*  CREATININE 0.81 0.64 0.62 0.66 0.64  CALCIUM 9.1 -- 8.6 8.4 8.4  MG -- -- -- -- --  PHOS -- -- -- -- --    Studies/Results: No results found.  Medications: Scheduled  Meds:    . baclofen  10 mg Oral TID  . cholecalciferol  1,000 Units Oral BID  . enoxaparin  40 mg Subcutaneous Q24H  . fluconazole (DIFLUCAN) IV  400 mg Intravenous Q24H  . free water  150 mL Per Tube 6 X Daily  . interferon beta-1b  0.3 mg Subcutaneous QODAY  . mulitivitamin with minerals  1 tablet Oral Daily  . omega-3 acid ethyl esters  1 g Oral Daily  . senna  2 tablet Oral Daily  . sodium chloride  3 mL Intravenous Q12H  . white petrolatum       Continuous Infusions:    . dextrose 5 % and 0.9 % NaCl with KCl 20 mEq/L 75 mL/hr at 04/17/11 2242  . feeding supplement (OSMOLITE 1.5 CAL)    . lactated ringers 50 mL/hr at 04/16/11 1227  . DISCONTD: feeding supplement (OSMOLITE 1.2 CAL) 1,000 mL (04/17/11 1514)   PRN Meds:.acetaminophen, food thickener, HYDROcodone-acetaminophen, morphine injection Antibiotics: Anti-infectives     Start     Dose/Rate Route Frequency Ordered Stop   04/16/11 0800   cefOXitin (MEFOXIN) 1 g in dextrose 5 % 50 mL IVPB     Comments: Please give on call to OR      1 g 100  mL/hr over 30 Minutes Intravenous  Once 04/16/11 0731 04/16/11 0830   04/12/11 1200   fluconazole (DIFLUCAN) IVPB 400 mg        400 mg 200 mL/hr over 60 Minutes Intravenous Every 24 hours 04/12/11 1106     04/12/11 0600   vancomycin (VANCOCIN) IVPB 1000 mg/200 mL premix  Status:  Discontinued        1,000 mg 200 mL/hr over 60 Minutes Intravenous Every 12 hours 04/11/11 1725 04/12/11 1106   04/11/11 1800   vancomycin (VANCOCIN) 1,250 mg in sodium chloride 0.9 % 250 mL IVPB        1,250 mg 166.7 mL/hr over 90 Minutes Intravenous  Once 04/11/11 1723 04/11/11 1954   04/10/11 2330   ciprofloxacin (CIPRO) IVPB 200 mg  Status:  Discontinued        200 mg 100 mL/hr over 60 Minutes Intravenous 2 times daily 04/10/11 2103 04/11/11 1613           Assessment/Plan: Patient with end stage multiple sclerosis and quadriplegia. Has recurrent urinary tract infections secondary to bladder  stones. These urinary tract infections cause altered mental status. Previously in March his urinary tract infection was enterococcus sensitive to vancomycin. Of note the family has refused hospice services and palliative care consult in the past. Further he has a history of aspiration.  GI and IR are unable to safely place gastrostomy tube.  The patient went to the Operating Room 4/8 for gastrostomy tube placement.   Continuous tube feeds have been started and are running without complication.  The patient is a full code.    Yeast UTI:   -This is indwelling Foley catheter associated UTI, patient also has bladder stones increases his risks. -Start Diflucan 4/4. Ok to d/c vancomycin. Prophylactic macrobid as pta   Acute encephalopathy: - Resolved Wife describes baseline as blinking once for yes or twice no.  Or patient mouths words but rarely speaks. CT head on admit with no acute findings.   Hypokalemia, mild: replete with IVFs and potassium liquid.  End-stage multiple sclerosis: family refused hospice and revoked DNR during recent hospitalization. They did undergo a palliative medicine consult on 03/17/11. They are of muslim faith and are receiving guidance from their Imam.   Chronic dysphagia with aspiration (family is aware he aspirates) -Gen. surgery is following. -Gastrostomy tube placed 4/8. -continuous peg feedings started.  Appreciate nutrition recs.  Will check residuals q 6 hours.  Fever. (101 on 4/9) - no fever for 24 hours as of 04/18/11. Possibly reaction to gastrostomy placement.  Will monitor.  Check u/a.   DVT prophylaxis: on Lovenox  Disposition: Patient is slightly tachycardic, and his wbc is slightly elevated.  Family not prepared to manage G-tube at home yet.  Home Health will be meeting them at their home on 4/11 to assist in use and care of PEG.  D/C to home 4/11 approximately mid day.   Algis Downs, PA-C [facility] Pager: [phone]  04/18/2011, 1:17  PM  Attending  I have seen and examined the patient, agree with the above assessment and plan, patient had one episode of fever early yesterday, afebrile since. Monitor another 24 hours. Will need peg teaching to family prior to discharge. Likely d/c in am-if afebrile.  Dr Windell Norfolk    LOS: 8 days

## 2011-04-18 NOTE — Progress Notes (Signed)
Nutrition Follow-up  Diet Order:  NPO  Osmolite 1.2 currently at 55 ml/hr per PA notes. Tolerating well. RD consulted to provide order for TF when pt returns home. Per RD's conversation with pts wife, would prefer nocturnal TF. RD will order TF per recommendations given in previous note, 4/9.   Meds: Scheduled Meds:   . baclofen  10 mg Oral TID  . cholecalciferol  1,000 Units Oral BID  . enoxaparin  40 mg Subcutaneous Q24H  . fluconazole (DIFLUCAN) IV  400 mg Intravenous Q24H  . interferon beta-1b  0.3 mg Subcutaneous QODAY  . mulitivitamin with minerals  1 tablet Oral Daily  . omega-3 acid ethyl esters  1 g Oral Daily  . senna  2 tablet Oral Daily  . sodium chloride  3 mL Intravenous Q12H  . white petrolatum      . DISCONTD: feeding supplement  1 Container Oral TID BM   Continuous Infusions:   . dextrose 5 % and 0.9 % NaCl with KCl 20 mEq/L 75 mL/hr at 04/17/11 2242  . feeding supplement (OSMOLITE 1.2 CAL) 1,000 mL (04/17/11 1514)  . lactated ringers 50 mL/hr at 04/16/11 1227   PRN Meds:.acetaminophen, food thickener, HYDROcodone-acetaminophen, morphine injection  Labs:  CMP     Component Value Date/Time   NA 135 04/17/2011 0645   K 4.3 04/17/2011 0645   CL 103 04/17/2011 0645   CO2 23 04/17/2011 0645   GLUCOSE 103* 04/17/2011 0645   BUN 3* 04/17/2011 0645   CREATININE 0.81 04/17/2011 0645   CALCIUM 9.1 04/17/2011 0645   PROT 6.5 03/12/2011 2054   ALBUMIN 3.1* 03/12/2011 2054   AST 23 03/12/2011 2054   ALT 18 03/12/2011 2054   ALKPHOS 98 03/12/2011 2054   BILITOT 0.4 03/12/2011 2054   GFRNONAA >90 04/17/2011 0645   GFRAA >90 04/17/2011 0645     Intake/Output Summary (Last 24 hours) at 04/18/11 1113 Last data filed at 04/18/11 0500  Gross per 24 hour  Intake   1710 ml  Output   1000 ml  Net    710 ml    Weight Status:  No new weights  Re-estimated needs:  1700-1900 kcal, 80-90 gm protein  Nutrition Dx:  Inadequate oral intake improving  Goal:  EN and PO intake will meet 100% of  estimated nutrition needs, still unmet.   Intervention:   Will order TF: Osmolite 1.5 at a rte of 85 ml/hr over 14 hours, (8 pm-10 am) via G tube. Also  Water flushes, 150 ml  6 times daily.  This will provide 1785 kcal, 75 gm protein, and 1806 ml water daily.   Monitor:  D/C needs    Rudean Haskell Pager #:  [phone]

## 2011-04-18 NOTE — Progress Notes (Signed)
Started teaching wife  Regarding tube feeding how to check tube placement and how to check for residual and  tube site care verbalize and understand.

## 2011-04-18 NOTE — Progress Notes (Signed)
Patient ID: Terry Palmer, male   DOB: 07/14/1975, 36 y.o.   MRN: [id number] 2 Days Post-Op  Subjective: Pt sleeping.  Seems to be tolerating TFs at 55cc/hr currently without an issue.  His goal is 65cc/hr.  Objective: Vital signs in last 24 hours: Temp:  [98.1 F (36.7 C)-98.9 F (37.2 C)] 98.5 F (36.9 C) (04/10 0517) Pulse Rate:  [102-116] 102  (04/10 0517) Resp:  [18-20] 20  (04/10 0517) BP: (103-127)/(57-80) 111/80 mmHg (04/10 0517) SpO2:  [96 %-100 %] 97 % (04/10 0517) Weight:  [160 lb 15 oz (73 kg)] 160 lb 15 oz (73 kg) (04/10 0517) Last BM Date: 04/17/11  Intake/Output from previous day: 04/09 0701 - 04/10 0700 In: 1713 [I.V.:1653] Out: 1000 [Urine:1000] Intake/Output this shift:    PE: Abd: soft, ND, g tube in place receiving tube feeds. Incision c/d/i with staples.  Lab Results:   Basename 04/18/11 0700 04/17/11 0645  WBC 13.1* 10.7*  HGB 11.8* 11.8*  HCT 35.3* 34.8*  PLT 368 381   BMET  Basename 04/17/11 0645 04/16/11 1840  NA 135 --  K 4.3 --  CL 103 --  CO2 23 --  GLUCOSE 103* --  BUN 3* --  CREATININE 0.81 0.64  CALCIUM 9.1 --   PT/INR No results found for this basename: LABPROT:2,INR:2 in the last 72 hours CMP     Component Value Date/Time   NA 135 04/17/2011 0645   K 4.3 04/17/2011 0645   CL 103 04/17/2011 0645   CO2 23 04/17/2011 0645   GLUCOSE 103* 04/17/2011 0645   BUN 3* 04/17/2011 0645   CREATININE 0.81 04/17/2011 0645   CALCIUM 9.1 04/17/2011 0645   PROT 6.5 03/12/2011 2054   ALBUMIN 3.1* 03/12/2011 2054   AST 23 03/12/2011 2054   ALT 18 03/12/2011 2054   ALKPHOS 98 03/12/2011 2054   BILITOT 0.4 03/12/2011 2054   GFRNONAA >90 04/17/2011 0645   GFRAA >90 04/17/2011 0645   Lipase  No results found for this basename: lipase       Studies/Results: No results found.  Anti-infectives: Anti-infectives     Start     Dose/Rate Route Frequency Ordered Stop   04/16/11 0800   cefOXitin (MEFOXIN) 1 g in dextrose 5 % 50 mL IVPB     Comments: Please give  on call to OR      1 g 100 mL/hr over 30 Minutes Intravenous  Once 04/16/11 0731 04/16/11 0830   04/12/11 1200   fluconazole (DIFLUCAN) IVPB 400 mg        400 mg 200 mL/hr over 60 Minutes Intravenous Every 24 hours 04/12/11 1106     04/12/11 0600   vancomycin (VANCOCIN) IVPB 1000 mg/200 mL premix  Status:  Discontinued        1,000 mg 200 mL/hr over 60 Minutes Intravenous Every 12 hours 04/11/11 1725 04/12/11 1106   04/11/11 1800   vancomycin (VANCOCIN) 1,250 mg in sodium chloride 0.9 % 250 mL IVPB        1,250 mg 166.7 mL/hr over 90 Minutes Intravenous  Once 04/11/11 1723 04/11/11 1954   04/10/11 2330   ciprofloxacin (CIPRO) IVPB 200 mg  Status:  Discontinued        200 mg 100 mL/hr over 60 Minutes Intravenous 2 times daily 04/10/11 2103 04/11/11 1613           Assessment/Plan  1. Dysphagia, s/p placement of open g tube  Plan: 1. Cont TFs.  Once patient is able  to tolerate goal TFs and is used to this he could be switched to bolus TFs at home or nightly TFs, whichever the family desires.  This way he does not have to continuously be hooked up to the feeding machine. 2.  He will need to follow up in our office in 7-10 days for staple removal.  We will sign off.  Please call if needed.   LOS: 8 days    Emmons Toth E 04/18/2011

## 2011-04-18 NOTE — Progress Notes (Signed)
This patient suffers from chronic pain which is caused by endstage multiple sclerosis and quadriplegia. Hospital bed will alleviate pain by allowing his spine and pelvis to be positioned in ways not feasible with a normal bed. Pain episodes and spasms are frequent and require frequent changes in body position which cannot be achieved with normal bed.

## 2011-04-19 LAB — BASIC METABOLIC PANEL
BUN: 6 mg/dL (ref 6–23)
Calcium: 8.8 mg/dL (ref 8.4–10.5)
Creatinine, Ser: 0.61 mg/dL (ref 0.50–1.35)
GFR calc Af Amer: >90 mL/min (ref >90)
GFR calc non Af Amer: >90 mL/min (ref >90)

## 2011-04-19 LAB — CBC
HCT: 35.8 % — ABNORMAL LOW (ref 39.0–52.0)
MCHC: 33.8 g/dL (ref 30.0–36.0)
MCV: 83.3 fL (ref 78.0–100.0)
Platelets: 421 10*3/uL — ABNORMAL HIGH (ref 150–400)
RDW: 14.4 % (ref 11.5–15.5)
WBC: 9.5 10*3/uL (ref 4.0–10.5)

## 2011-04-19 MED ORDER — OSMOLITE 1.5 CAL PO LIQD: 1000.0000 mL | ORAL | Status: DC

## 2011-04-19 MED ORDER — FREE WATER: 150.0000 mL | Freq: Every day | Status: DC

## 2011-04-19 MED ORDER — HYDROCODONE-ACETAMINOPHEN 7.5-500 MG/15ML PO SOLN: 10.0000 mL | Freq: Four times a day (QID) | ORAL | Status: DC | PRN

## 2011-04-19 NOTE — Discharge Summary (Signed)
Physician Discharge Summary  Patient ID: Terry Palmer MRN: [id number] DOB/AGE: 08-21-1975 35 y.o.  Admit date: 04/10/2011 Discharge date: 04/19/2011    Discharge Diagnoses:  1. Yeast UTI 2. Acute encephalopathy, resolved 3. Hypokalemia, resolved 4. End-stage MS/Quadriparesis 5. Chronic dysphagia secondary to above s/p  gtube placement 04/16/11  Consults: General surgery for G tube placement   Brief Summary: Terry Palmer is a 36 y.o. y/o male with a PMH of multiple sclerosis and quadriparesis, bedbound, history of recurrent UTIs ( on chronic foley) that cause anorexia. He was hospitalized just prior to this admission with enterococcal UTI. Pt presented to the ED on 4/2 with his wife with complaints of lethargy and poor po intake. During recent hospitalization, pt was seen by IR and GI for peg tube and given his complex anatomy recommended open surgery for PEG placement and family refused at that time. Of note the family has refused hospice services and palliative care consult in the past. They reversed his DNR during recent admission and continue to wish pt to be a full code.     Hospital Course by Discharge Summary  1. Yeast UTI: initially started on empiric Vancomycin given recent enterococcal UTI. Culture grew only yeast. Vancomycin was discontinued and pt was treated with Diflucan x 7 days. Will continue prophylactic Macrobid as prior to admission. Recurrent UTI's related to chronic indwelling foley and bladder stones.   2. Acute encephalopathy, resolved: felt secondary to UTI as resolved with treatment and patient has had similar presentation in the past. Pt currently at baseline blinking once for no and twice for yes, nonverbal.  3. End-stage MS with chronic dysphagia: as mentioned above, family did not wish to proceed with surgical intervention during most recent hospitalization 3/4-3/18 after GI and IR were unable to place G-tube with pt's difficult anatomy. However, pt's  wife and mother opted to proceed with g-tube placement as they felt he was having increasing difficulty with dysphagia and subsequent anorexia. Gastrostomy tube was placed on 4/8. Pt is currently receiving continuous peg feedings per nutrition recommendations. This will be continued at discharged and followed by [facility]. Plan to transition pt to bolus feeds though family is somewhat uncomfortable with this as of now until more teaching can be done in home setting. Pt still with significant aspiration risk despite tube placement.   4. Disposition: patient's family has  had multiple discussions with the palliative care team during recent hospitalization. They have changed patient's code status to a full code.  However the family and patient regularly advised that this patient has end-stage/advanced multiple sclerosis, remains at risk for recurrent UTIs and aspiration pneumonia. He also is at risk for DVTs and sacral decubitus ulcers. At this point plans are to discharge him back home with home health services.   Significant Diagnostic Studies:   PORTABLE CHEST - 1 VIEW  Comparison: Portable exam 1707 hours compared to 03/23/2011  Findings:  Normal heart size, mediastinal contours, and pulmonary vascularity.  Minimal peribronchial thickening, chronic.  No infiltrate, pleural effusion or pneumothorax.  Bones unremarkable.  IMPRESSION:  No acute abnormalities.   Discharge Exam: Gen: Chronically weak appearing, Eyes open,  HEENT: Eyes with slow nystagmus  Cardiovascular: S1S2 RRR, no m/r/g  Respiratory: CTAB, no w/r/c, no increased wob  Gastrointestinal: abdomen flat, Gastrostomy in place. + BS. Peg site with no infection. Patient tender to palpation.  Extremities: contracted LE's. No c/ce.  GU: foley draining clear, yellow urine.      Discharge Labs  BMET  Lab 04/19/11 0545 04/17/11 0645 04/16/11 1840 04/14/11 0500 04/13/11 0700  NA 139 135 -- 140 138  K 3.9 4.3 -- -- --  CL 104 103 --  110 106  CO2 24 23 -- 21 20  GLUCOSE 117* 103* -- 103* 98  BUN 6 3* -- <3* 3*  CREATININE 0.61 0.81 0.64 0.62 0.66  CALCIUM 8.8 9.1 -- 8.6 8.4  MG -- -- -- -- --  PHOS -- -- -- -- --     CBC   Lab 04/19/11 0545 04/18/11 0700 04/17/11 0645  HGB 12.1* 11.8* 11.8*  HCT 35.8* 35.3* 34.8*  WBC 9.5 13.1* 10.7*  PLT 421* 368 381         Follow-up Information    Follow up with AVBUERE,EDWIN A, MD. Call in 2 weeks.      Schedule an appointment as soon as possible for a visit with Liz Malady, MD. (7-10 days for staple removal)    Contact information:   [facility] [address] [place] [address] [phone]            Aries, [place]  Home Medication Instructions [id number]   Printed on:04/19/11 1257  Medication Information                    cholecalciferol (VITAMIN D) 1000 UNITS tablet Take 1,000 Units by mouth 2 (two) times daily.            Garlic 1000 MG CAPS Take 1,000 mg by mouth daily.             interferon beta-1b (BETASERON) 0.3 MG injection Inject 0.3 mg into the skin every other day. Next dose due 04/10/2011           baclofen (LIORESAL) 20 MG tablet Take 10 mg by mouth 3 (three) times daily.           nitrofurantoin (MACRODANTIN) 100 MG capsule Take 100 mg by mouth 2 (two) times daily.           senna (SENOKOT) 8.6 MG TABS Take 2 tablets by mouth daily.           omega-3 acid ethyl esters (LOVAZA) 1 G capsule Take 1 g by mouth daily.           HYDROcodone-acetaminophen (LORTAB) 7.5-500 MG/15ML solution Place 10 mLs into feeding tube every 6 (six) hours as needed.           Water For Irrigation, Sterile (FREE WATER) SOLN Place 150 mLs into feeding tube 6 (six) times daily.           Nutritional Supplements (FEEDING SUPPLEMENT, OSMOLITE 1.5 CAL,) LIQD Place 1,000 mLs into feeding tube continuous.              Disposition: 06-Home-Health Care Svc  Discharged Condition: Terry Palmer has met maximum benefit of inpatient care and is medically stable and cleared for discharge.  Patient is pending follow up as above.      Time spent on disposition:  Greater than 35 minutes.   Cordelia Pen, NP-C [facility]  pgr [phone]

## 2011-04-19 NOTE — Progress Notes (Signed)
Discharge instructions reviewed with wife. Verbalized and understand prescription given to wife. Foam dressing intact to sacral area. PEG tube intact site cleanse and new dsg reapplied. Foley intact and draining yellow urine. 1325 Patient discharged to home per order, transported via ambulance and two attendants also  Accompanied by wife.

## 2011-04-19 NOTE — Progress Notes (Signed)
   CARE MANAGEMENT NOTE 04/19/2011  Patient:  NEO, YEPIZ   Account Number:  [id number]  Date Initiated:  04/11/2011  Documentation initiated by:  Donn Pierini  Subjective/Objective Assessment:   Pt admitted with encephalopathy     Action/Plan:   PTA pt was living at home with wife, assisted with ADLs- had HH with [facility]   Anticipated DC Date:  04/17/2011   Anticipated DC Plan:  HOME W HOME HEALTH SERVICES      DC Planning Services  CM consult      [facility] Choice  Resumption Of Svcs/PTA Provider   Choice offered to / List presented to:  C-3 Spouse   DME arranged  TUBE FEEDING  TUBE FEEDING PUMP      DME agency  [facility]     [facility] arranged  HH-1 RN  HH-2 PT  HH-3 OT  HH-4 NURSE'S AIDE  HH-5 SPEECH THERAPY  HH-6 SOCIAL WORKER      HH agency  [facility]   Status of service:  Completed, signed off Medicare Important Message given?   (If response is "NO", the following Medicare IM given date fields will be blank) Date Medicare IM given:   Date Additional Medicare IM given:    Discharge Disposition:  HOME W HOME HEALTH SERVICES  Per UR Regulation:    If discussed at Long Length of Stay Meetings, dates discussed:   04/18/2011    Comments:  PCP- Avbuere  04/19/11 14:11 Letha Cape RN, BSN   [phone] patient dc to home today via ambulance.  [facility] with [facility] notified. Patient's wife, [name] also wanted anothe hospital bed ordered, informed [facility] and faxed order over for hospital bed to [facility].  Corrie Dandy stated to inform the wife that they are working on getting another hospital bed with air mattress overlay.  04/18/11 10:53  Letha Cape RN, BSN [phone] patient for dc today, will resume hh services with [facility], informed [facility] with [facility] that patient is for dc today, she will be here around 1:30 pm.  Patient will need ambulance transport at dc.  Informed patient's wife that pt will be dc today, she  will be here shortly.  04/17/11- 1150- Donn Pierini RN, BSN [phone] Pt s/p peg tub placement- to start tube feeds today- plan to d/c home with [facility]- when tolerating tube feeds.  04/13/11 15:37 Letha Cape RN, BSN [phone] patient will have the surgery for the peg tube on Monday, Patient will resume services with [facility] for [facility], PT,OT, St, aide and Child psychotherapist.  Referral made to [facility] with [facility], Corrie Dandy will come by hospital on Monday to get orders.  04/12/11 15:50 Letha Cape RN, BSN [phone] patient has poor po intake, just started on Diflucan, was on Vanc iv and cipro iv but dc'd.  Will need resumption of [facility] services for RN,PT, OT, ST, aide and CSW when ready for dc.  04/11/11- 1200- Donn Pierini RN, BSN [phone] Pt was recently discharged home with [facility] services through [facility] for HH-RN/PT/OT/ST/aide/CSW. Pt has a hospital bed and air overlay at home. Arrangements were also made for pt to be followed at home by Dr. Florentina Jenny. Will need resumption orders for [facility] at time of discharge. CM to follow

## 2011-04-19 NOTE — Discharge Summary (Signed)
I have seen and examined the patient, I agree with the assessment and plan as outlined as above.He is now persistently afebrile, tachycardia is significantly better. His peg feeding is at goal, he is stable to be discharged home today.

## 2011-04-19 NOTE — Progress Notes (Signed)
0400 Pt HR 115-130. Pt moaning, grimace and clammy.  Gave pt Lortab 10 ml tube. Will continue to monitor. Dahlia Byes Boschen

## 2011-04-20 ENCOUNTER — Inpatient Hospital Stay (HOSPITAL_COMMUNITY): Payer: Medicare HMO

## 2011-04-20 ENCOUNTER — Inpatient Hospital Stay (HOSPITAL_COMMUNITY)
Admission: EM | Admit: 2011-04-20 | Discharge: 2011-05-03 | DRG: 871 | Disposition: A | Discharge status: Home Health | Payer: Medicare HMO | Attending: Internal Medicine | Admitting: Internal Medicine

## 2011-04-20 ENCOUNTER — Encounter (HOSPITAL_COMMUNITY): Payer: Self-pay | Admitting: *Deleted

## 2011-04-20 DIAGNOSIS — K668 Other specified disorders of peritoneum: Secondary | ICD-10-CM | POA: Diagnosis present

## 2011-04-20 DIAGNOSIS — L89109 Pressure ulcer of unspecified part of back, unspecified stage: Secondary | ICD-10-CM | POA: Diagnosis present

## 2011-04-20 DIAGNOSIS — E46 Unspecified protein-calorie malnutrition: Secondary | ICD-10-CM | POA: Diagnosis present

## 2011-04-20 DIAGNOSIS — N390 Urinary tract infection, site not specified: Secondary | ICD-10-CM | POA: Diagnosis present

## 2011-04-20 DIAGNOSIS — K5900 Constipation, unspecified: Secondary | ICD-10-CM | POA: Diagnosis present

## 2011-04-20 DIAGNOSIS — Q232 Congenital mitral stenosis: Secondary | ICD-10-CM

## 2011-04-20 DIAGNOSIS — L89152 Pressure ulcer of sacral region, stage 2: Secondary | ICD-10-CM | POA: Diagnosis present

## 2011-04-20 DIAGNOSIS — R627 Adult failure to thrive: Secondary | ICD-10-CM | POA: Diagnosis present

## 2011-04-20 DIAGNOSIS — L8992 Pressure ulcer of unspecified site, stage 2: Secondary | ICD-10-CM | POA: Diagnosis present

## 2011-04-20 DIAGNOSIS — G35 Multiple sclerosis: Secondary | ICD-10-CM | POA: Diagnosis present

## 2011-04-20 DIAGNOSIS — A419 Sepsis, unspecified organism: Secondary | ICD-10-CM | POA: Diagnosis present

## 2011-04-20 DIAGNOSIS — N210 Calculus in bladder: Secondary | ICD-10-CM | POA: Diagnosis present

## 2011-04-20 DIAGNOSIS — M6281 Muscle weakness (generalized): Secondary | ICD-10-CM | POA: Diagnosis present

## 2011-04-20 DIAGNOSIS — R1310 Dysphagia, unspecified: Secondary | ICD-10-CM | POA: Diagnosis present

## 2011-04-20 DIAGNOSIS — R197 Diarrhea, unspecified: Secondary | ICD-10-CM | POA: Diagnosis present

## 2011-04-20 DIAGNOSIS — R000 Tachycardia, unspecified: Secondary | ICD-10-CM | POA: Diagnosis present

## 2011-04-20 LAB — CBC
HCT: 32.9 % — ABNORMAL LOW (ref 39.0–52.0)
MCV: 82.8 fL (ref 78.0–100.0)
MCV: 83.5 fL (ref 78.0–100.0)
Platelets: 469 10*3/uL — ABNORMAL HIGH (ref 150–400)
RBC: 4.37 MIL/uL (ref 4.22–5.81)
RBC: 3.94 MIL/uL — ABNORMAL LOW (ref 4.22–5.81)
RDW: 14.3 % (ref 11.5–15.5)
WBC: 11.6 10*3/uL — ABNORMAL HIGH (ref 4.0–10.5)
WBC: 7.7 10*3/uL (ref 4.0–10.5)

## 2011-04-20 LAB — BASIC METABOLIC PANEL
Calcium: 9.1 mg/dL (ref 8.4–10.5)
Creatinine, Ser: 0.61 mg/dL (ref 0.50–1.35)
GFR calc Af Amer: >90 mL/min (ref >90)
GFR calc non Af Amer: >90 mL/min (ref >90)
Sodium: 135 mEq/L (ref 135–145)

## 2011-04-20 LAB — URINALYSIS, ROUTINE W REFLEX MICROSCOPIC
Bilirubin Urine: NEGATIVE
Nitrite: POSITIVE — AB
Specific Gravity, Urine: 1.029 (ref 1.005–1.030)
Urobilinogen, UA: 0.2 mg/dL (ref 0.0–1.0)

## 2011-04-20 LAB — MAGNESIUM: Magnesium: 2.1 mg/dL (ref 1.5–2.5)

## 2011-04-20 LAB — CREATININE, SERUM: GFR calc Af Amer: >90 mL/min (ref >90)

## 2011-04-20 LAB — URINE MICROSCOPIC-ADD ON

## 2011-04-20 MED ORDER — HYDROMORPHONE HCL PF 1 MG/ML IJ SOLN
0.5000 mg | INTRAMUSCULAR | Status: DC | PRN
  Filled 2011-04-20: qty 1

## 2011-04-20 MED ORDER — INTERFERON BETA-1B 0.3 MG ~~LOC~~ SOLR
0.3000 mg | SUBCUTANEOUS | Status: DC
  Administered 2011-04-22 – 2011-05-02 (×5): 0.3 mg via SUBCUTANEOUS

## 2011-04-20 MED ORDER — OSMOLITE 1.5 CAL PO LIQD: 1000.0000 mL | ORAL | Status: DC

## 2011-04-20 MED ORDER — ACETAMINOPHEN 160 MG/5ML PO SOLN: 650.0000 mg | Freq: Four times a day (QID) | ORAL | Status: DC | PRN

## 2011-04-20 MED ORDER — ENOXAPARIN SODIUM 40 MG/0.4ML ~~LOC~~ SOLN
40.0000 mg | SUBCUTANEOUS | Status: DC
  Administered 2011-04-20 – 2011-05-02 (×13): 40 mg via SUBCUTANEOUS
  Filled 2011-04-20 (×15): qty 0.4

## 2011-04-20 MED ORDER — BACLOFEN 10 MG PO TABS
10.0000 mg | ORAL_TABLET | Freq: Three times a day (TID) | ORAL | Status: DC
  Administered 2011-04-21 – 2011-04-25 (×11): 10 mg
  Filled 2011-04-20 (×16): qty 1

## 2011-04-20 MED ORDER — SODIUM CHLORIDE 0.9 % IV SOLN
250.0000 mg | Freq: Four times a day (QID) | INTRAVENOUS | Status: AC
  Administered 2011-04-21 – 2011-04-23 (×11): 250 mg via INTRAVENOUS
  Filled 2011-04-20 (×14): qty 250

## 2011-04-20 MED ORDER — SODIUM CHLORIDE 0.9 % IV SOLN
250.0000 mg | Freq: Once | INTRAVENOUS | Status: AC
  Administered 2011-04-20: 250 mg via INTRAVENOUS
  Filled 2011-04-20: qty 250

## 2011-04-20 MED ORDER — SODIUM CHLORIDE 0.9 % IV SOLN
INTRAVENOUS | Status: DC
  Administered 2011-04-20: 20:00:00 via INTRAVENOUS

## 2011-04-20 MED ORDER — VANCOMYCIN HCL 1000 MG IV SOLR
750.0000 mg | Freq: Two times a day (BID) | INTRAVENOUS | Status: DC
  Administered 2011-04-21 – 2011-04-23 (×5): 750 mg via INTRAVENOUS
  Filled 2011-04-20 (×7): qty 750

## 2011-04-20 MED ORDER — ONDANSETRON HCL 4 MG/2ML IJ SOLN: 4.0000 mg | Freq: Four times a day (QID) | INTRAMUSCULAR | Status: DC | PRN

## 2011-04-20 MED ORDER — ONDANSETRON HCL 4 MG PO TABS: 4.0000 mg | ORAL_TABLET | Freq: Four times a day (QID) | ORAL | Status: DC | PRN

## 2011-04-20 MED ORDER — FREE WATER
150.0000 mL | Freq: Every day | Status: DC
  Administered 2011-04-21 – 2011-04-27 (×33): 150 mL

## 2011-04-20 MED ORDER — SENNA 8.6 MG PO TABS
2.0000 | ORAL_TABLET | Freq: Every day | ORAL | Status: DC
  Administered 2011-04-22: 17.2 mg
  Filled 2011-04-20 (×4): qty 2

## 2011-04-20 MED ORDER — SODIUM CHLORIDE 0.9 % IV BOLUS (SEPSIS)
1000.0000 mL | Freq: Once | INTRAVENOUS | Status: AC
  Administered 2011-04-20: 1000 mL via INTRAVENOUS

## 2011-04-20 MED ORDER — OSMOLITE 1.5 CAL PO LIQD
1000.0000 mL | ORAL | Status: DC
  Filled 2011-04-20 (×5): qty 1000

## 2011-04-20 MED ORDER — VANCOMYCIN HCL IN DEXTROSE 1-5 GM/200ML-% IV SOLN
1000.0000 mg | Freq: Once | INTRAVENOUS | Status: AC
  Administered 2011-04-20: 1000 mg via INTRAVENOUS
  Filled 2011-04-20: qty 200

## 2011-04-20 MED ORDER — SODIUM CHLORIDE 0.9 % IV SOLN
INTRAVENOUS | Status: DC
  Administered 2011-04-20 – 2011-04-23 (×6): via INTRAVENOUS

## 2011-04-20 MED ORDER — DEXTROSE 5 % IV SOLN
1.0000 g | Freq: Once | INTRAVENOUS | Status: AC
  Administered 2011-04-20: 1 g via INTRAVENOUS
  Filled 2011-04-20: qty 10

## 2011-04-20 MED ORDER — HYDROCODONE-ACETAMINOPHEN 7.5-500 MG/15ML PO SOLN
10.0000 mL | Freq: Four times a day (QID) | ORAL | Status: DC | PRN
  Administered 2011-04-25 – 2011-05-03 (×7): 10 mL
  Filled 2011-04-20 (×7): qty 15

## 2011-04-20 NOTE — Progress Notes (Signed)
Clinical Social Work Department BRIEF PSYCHOSOCIAL ASSESSMENT 04/20/2011  Patient:  Terry Palmer, Terry Palmer     Account Number:  [id number]     Admit date:  04/20/2011  Clinical Social Worker:  Thomasene Mohair  Date/Time:  04/20/2011 03:45 PM  Referred by:  Physician  Date Referred:  04/20/2011 Referred for  SNF Placement   Other Referral:   Interview type:  Family Other interview type:   Pt emergency room with family, awake but sleepy.    PSYCHOSOCIAL DATA Living Status:  FAMILY Admitted from facility:   Level of care:   Primary support name:  Hosteen Kienast Primary support relationship to patient:  SPOUSE Degree of support available:   strong. Mother very supportive as well. 61yr old son and 89 year old daugther in home.    CURRENT CONCERNS Current Concerns  Post-Acute Placement  Other - See comment   Other Concerns:   End of life issues, support for wife    SOCIAL WORK ASSESSMENT / PLAN CSW was requested to meet with Pt and family in the ED because they are requesting SNF placement. Pt dc'd home with [facility] home health services yesterday afternoon. Pt has new PEG tube which family was not educated on prior to dc and then food was not in home when home health nurse arrived to do the teaching. Pt's wife called home health nurse with concerns with Pt's tube and was instructed to bring Pt to ED. Pt's wife and mother now agree that Pt would benefit from 24hr skilled nursing care. Pt's wife is very supportive but reports little social support related to current situation. Pt also have 2 children at home who are grieving the loss of their father as he is in end stage of his disease.  Pt's mother is supportive and at bedside, she is in agreement with placement.  Pt's insurance requires preauthorization for SNF and is closed until Monday.  Pt's secondary insurance also requires preauthorization. Pt would likely qualify for hospice services, but family is not interested in those services at  this time and are sensitive to the conversation.  Pt's family receives support from their Imam.   Assessment/plan status:  Psychosocial Support/Ongoing Assessment of Needs Other assessment/ plan:   Work on SNF placement while Pt is in ED. CSW will contact facilities with medicaid beds for potential availability.   Information/referral to community resources:    PATIENT'S/FAMILY'S RESPONSE TO PLAN OF CARE: Pt's family is overwhelmed with new care requirements of Pt.  Pt's wife is very attentive and is trying to navigate Pt's care. Pt's family is agreeable to SNF placement.     Frederico Hamman, LCSW [phone]

## 2011-04-20 NOTE — Consult Note (Signed)
ANTIBIOTIC CONSULT NOTE - INITIAL  Pharmacy Consult for Vancomycin, Imipenem Indication: UTI  No Known Allergies  Patient Measurements:  Height: 6\' 4"  (193 cm)  Weight: 148 lb 5.9 oz (67.3 kg)  IBW/kg (Calculated) : 86.8   Vital Signs: Temp: 97.8 F (36.6 C) (04/12 1203) Temp src: Rectal (04/12 1203) BP: 109/85 mmHg (04/12 1600) Pulse Rate: 107  (04/12 1600) Intake/Output from previous day:   Intake/Output from this shift:    Labs:  Basename 04/20/11 1149 04/19/11 0545 04/18/11 0700  WBC 11.6* 9.5 13.1*  HGB 12.2* 12.1* 11.8*  PLT 469* 421* 368  LABCREA -- -- --  CREATININE 0.61 0.61 --   The CrCl is unknown because both a height and weight (above a minimum accepted value) are required for this calculation. No results found for this basename: VANCOTROUGH:2,VANCOPEAK:2,VANCORANDOM:2,GENTTROUGH:2,GENTPEAK:2,GENTRANDOM:2,TOBRATROUGH:2,TOBRAPEAK:2,TOBRARND:2,AMIKACINPEAK:2,AMIKACINTROU:2,AMIKACIN:2, in the last 72 hours   Microbiology: Recent Results (from the past 720 hour(s))  URINE CULTURE     Status: Normal   Collection Time   04/10/11  5:23 PM      Component Value Range Status Comment   Specimen Description URINE, CATHETERIZED   Final    Special Requests NONE   Final    Culture  Setup Time [id number]   Final    Colony Count >=100,000 COLONIES/ML   Final    Culture YEAST   Final    Report Status 04/11/2011 FINAL   Final     Medical History: Past Medical History  Diagnosis Date  . MS (multiple sclerosis)   . UTI (urinary tract infection)   . 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 Childhood asthma   . Depression   . Neuromuscular disorder     Quadraperesis    Medications:   (Not in a hospital admission) Admit Complaint: 36 y.o.  male  admitted 04/20/2011 with MS, quadraparesis, neurogenic bladder with chronic foley admitted with fever and tachycardia.  Pharmacy consulted to start vancomycin and imipenem empirically for UTI.  Assessment: Infectious  Disease: UTI, sepsis, currently afebrile, WBC elevated, tachycardic, low BP, His UTI in April it was Yeast and in March was enterococcus sensitive to vancomycin.   Goal of Therapy:  Vancomycin trough level 15-20 mcg/ml   Plan:  1. Vancomycin 1g IV x 1 load then 750 mg IV q12h, due to MS, population PK not likely to be accurate will plan trough at steady state to evaluate dose. 2. Imipenem 250 mg IV q6h 3. Follow up SCr, UOP, cultures, clinical course and adjust as clinically indicated.   Terry Palmer Terry Palmer Terry Palmer Terry Palmer 04/20/2011,6:48 PM

## 2011-04-20 NOTE — Progress Notes (Signed)
Paged by NP Dewayne Hatch to find placement for this patient. Informed her that I would contact SW for this. I contacted SW Delice Bison and gave her a brief background and need.

## 2011-04-20 NOTE — ED Notes (Signed)
Pt has MS, had peg tube placed yesterday. Pts baseline is non verbal and non mobile. Family noticed low grade fever this am and pt is curled up and appears in some abd pain.

## 2011-04-20 NOTE — Progress Notes (Signed)
Chart review complete. Patient triggered on our high risk for readmission report. Contacted [employer] CM Nunzio Cory [phone]) regarding available support services to assist this patient.  Left VM message with call back request.  The patient is clinically not appropriate for [facility] Care Management services. However, I am available to assist with community resource assessments. For any additional questions or new referrals please contact Anibal Henderson BSN RN [facility] Liaison at [phone].

## 2011-04-20 NOTE — ED Notes (Signed)
Patient was received to POD 11 with c/o on and off abdominal pain with fever. Pt has a hx of MS and is non-verbal. Pt has contractures noted to legs. Family is at the bedside

## 2011-04-20 NOTE — H&P (Signed)
PCP:   Dorrene German, MD, MD   Chief Complaint:  Feverish and sweaty today, with rapid heart rate.  HPI: This is a 36 year old male, with known history of multiple sclerosis, quadriparesis and neurogenic bladder, on chronic Foley catheter, bedbound, stage 2 sacral decubitus, recurrent UTIs on prophylactic Macrobid, hospitalized from 04/10/11- 04/19/11, with FFT, dehydration, fungal UTI and altered mental status. Patient has chronic dysphagia, and aspiration. In the recent past, he had been evaluated by IR and GI for peg tube placement and given his complex anatomy, was recommended  surgery for PEG. Gastrostomy tube was placed surgically on 04/16/11, by Dr Violeta Gelinas. Patient was discharged in satisfactery condition. Family is not present in the ED at this time, and at baseline, patient is nonverbal, nodding or shaking his head, and/or blinking once for no and twice for yes. History is gleaned mainly from ED PA, and patient nodded in affirmative, as i repeated the information I had. It appears the he became quite sweaty and ran a temperature on 04/19/11, after getting home. Today, he has become worse, and developed a rapid heart rate. He was therefore, brought to the ED.  Allergies:  No Known Allergies    Past Medical History  Diagnosis Date  . MS (multiple sclerosis)   . UTI (urinary tract infection)   . 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 Childhood asthma   . Depression   . Neuromuscular disorder     Quadraperesis    Past Surgical History  Procedure Date  . Lumbar puncture 10/12/2002  . Gastrostomy 04/16/2011    Procedure: GASTROSTOMY;  Surgeon: Liz Malady, MD;  Location: [facility] OR;  Service: General;  Laterality: N/A;  Open G-Tube placement    Prior to Admission medications   Medication Sig Start Date End Date Taking? Authorizing Provider  baclofen (LIORESAL) 20 MG tablet Take 10 mg by mouth 3 (three) times daily.   Yes Historical Provider, MD  cholecalciferol (VITAMIN D)  1000 UNITS tablet Take 1,000 Units by mouth 2 (two) times daily.    Yes Historical Provider, MD  Garlic 1000 MG CAPS Take 1,000 mg by mouth daily.     Yes Historical Provider, MD  HYDROcodone-acetaminophen (LORTAB) 7.5-500 MG/15ML solution Place 10 mLs into feeding tube every 6 (six) hours as needed. For pain 04/19/11 04/29/11 Yes Cordelia Pen, NP  ibuprofen (ADVIL,MOTRIN) 200 MG tablet Take 200 mg by mouth every 6 (six) hours as needed. For pain   Yes Historical Provider, MD  interferon beta-1b (BETASERON) 0.3 MG injection Inject 0.3 mg into the skin every other day. Next dose due 04/20/2011   Yes Historical Provider, MD  nitrofurantoin (MACRODANTIN) 100 MG capsule Take 100 mg by mouth 2 (two) times daily.   Yes Historical Provider, MD  omega-3 acid ethyl esters (LOVAZA) 1 G capsule Take 1 g by mouth daily.   Yes Historical Provider, MD  senna (SENOKOT) 8.6 MG TABS Take 2 tablets by mouth daily.   Yes Historical Provider, MD  Nutritional Supplements (FEEDING SUPPLEMENT, OSMOLITE 1.5 CAL,) LIQD Place 1,000 mLs into feeding tube continuous. 04/19/11   Cordelia Pen, NP    Social History:  reports that he quit smoking about 12 years ago. His smoking use included Cigarettes. He has quit using smokeless tobacco. He reports that he uses illicit drugs (Marijuana and Cocaine). He reports that he does not drink alcohol.  Family History  Problem Relation Age of Onset  . Asthma Mother     Review of Systems:  As per HPI and chief complaint. Patent denies chest pain, cough, vomiting, diarrhea. The rest of the systems review was unobtainable.  Physical Exam:  General:  Patient does not appear to be in obvious acute distress, not short of breath at rest, following simple commands. He appears warm to tough, and mildly diaphoretic.  HEENT:  No clinical pallor, no jaundice, no conjunctival injection or discharge. Hydration status appears fair. NECK:  Supple, JVP not seen, no carotid bruits, no palpable  lymphadenopathy, no palpable goiter. CHEST:  Clinically clear to auscultation, no wheezes, no crackles. HEART:  Sounds 1 and 2 heard, normal, regular, mildly tachycardic, no murmurs. ABDOMEN:  Flat, soft, non-tender, no palpable organomegaly, no palpable masses, normal bowel sounds. Peg site is unremarkable, and dressings are clean. GENITALIA:  Not examined. Patient has healing stage 2 sacral decubitus. LOWER EXTREMITIES:  No pitting edema, palpable peripheral pulses. MUSCULOSKELETAL SYSTEM:  Unremarkable. CENTRAL NERVOUS SYSTEM:  Quadriparetic.  Labs on Admission:  Results for orders placed during the hospital encounter of 04/20/11 (from the past 48 hour(s))  BASIC METABOLIC PANEL     Status: Abnormal   Collection Time   04/20/11 11:49 AM      Component Value Range Comment   Sodium 135  135 - 145 (mEq/L)    Potassium 3.8  3.5 - 5.1 (mEq/L)    Chloride 101  96 - 112 (mEq/L)    CO2 20  19 - 32 (mEq/L)    Glucose, Bld 106 (*) 70 - 99 (mg/dL)    BUN 11  6 - 23 (mg/dL)    Creatinine, Ser 1.61  0.50 - 1.35 (mg/dL)    Calcium 9.1  8.4 - 10.5 (mg/dL)    GFR calc non Af Amer >90  >90 (mL/min)    GFR calc Af Amer >90  >90 (mL/min)   CBC     Status: Abnormal   Collection Time   04/20/11 11:49 AM      Component Value Range Comment   WBC 11.6 (*) 4.0 - 10.5 (K/uL)    RBC 4.37  4.22 - 5.81 (MIL/uL)    Hemoglobin 12.2 (*) 13.0 - 17.0 (g/dL)    HCT 09.6 (*) 04.5 - 52.0 (%)    MCV 82.8  78.0 - 100.0 (fL)    MCH 27.9  26.0 - 34.0 (pg)    MCHC 33.7  30.0 - 36.0 (g/dL)    RDW 40.9  81.1 - 91.4 (%)    Platelets 469 (*) 150 - 400 (K/uL)   URINALYSIS, ROUTINE W REFLEX MICROSCOPIC     Status: Abnormal   Collection Time   04/20/11 12:49 PM      Component Value Range Comment   Color, Urine AMBER (*) YELLOW  BIOCHEMICALS MAY BE AFFECTED BY COLOR   APPearance TURBID (*) CLEAR     Specific Gravity, Urine 1.029  1.005 - 1.030     pH 6.0  5.0 - 8.0     Glucose, UA NEGATIVE  NEGATIVE (mg/dL)    Hgb  urine dipstick LARGE (*) NEGATIVE     Bilirubin Urine NEGATIVE  NEGATIVE     Ketones, ur 15 (*) NEGATIVE (mg/dL)    Protein, ur 782 (*) NEGATIVE (mg/dL)    Urobilinogen, UA 0.2  0.0 - 1.0 (mg/dL)    Nitrite POSITIVE (*) NEGATIVE     Leukocytes, UA LARGE (*) NEGATIVE    URINE MICROSCOPIC-ADD ON     Status: Abnormal   Collection Time   04/20/11 12:49 PM  Component Value Range Comment   WBC, UA TOO NUMEROUS TO COUNT  <3 (WBC/hpf)    RBC / HPF 7-10  <3 (RBC/hpf)    Bacteria, UA FEW (*) RARE      Radiological Exams on Admission: No results found.  Assessment/Plan Principal Problem:  *UTI (lower urinary tract infection): Patient has a positive urinary sediment, with marked pyuria, consistent with a UTI. He has just completed a 7-day course of Diflucan for funguria, during his recent hospitalization. ED PA has given a single dose of iv Rocephin already, but given his hository of recurrences, chronic Foley catheter and prophylactic Macrobid, this is a complicated UTI, and I suspect a resistant organism may be responsible. We shall therefore, treat with Vancomycin/Primaxin, until cultures/sensitivities are available.  Active Problems:  1. Sepsis: Patient appears to have SIRS, and possibly even early sepsis. The has a leukocytosis, tachycardia and borderline low BP. He was reportedly, feverish at home. We shall carry out septic workup, including urine cultures, blood cultures and CXR. Also check lactic acid level and procalcitonin.  2. Multiple sclerosis: This is advanced MS, and patient is under the care of Dr Lesia Sago, neurologist, and is on Betaseron, which we shall continue.  3. FTT (failure to thrive) in adult/Protein calorie malnutrition: This is occasioned by chronic dysphagia, due to multiple sclerosis, and will be addressed with artificial nutrition, via PEG tube.   4. Sacral decubitus ulcer, stage II: Patient dose have a healing stage 2 sacral decubitus, which appease well granulated  and un-infected. We shall request WOC consult, and utilize appropriate mattress overlay.  Further management will depend on clinical course.  Comment: Of note the family has refused hospice services and palliative care consult in the past. They reversed his DNR during recent admission and continue to wish patient to be FULL CODE.   Per my discussion with Remi Haggard PA, family now want patient placed.  Time Spent on Admission: 1 hour.  [place] Jakubiak,CHRISTOPHER 04/20/2011, 5:14 PM

## 2011-04-20 NOTE — ED Notes (Signed)
Pt up to floor with EMT & family x2, pt & family updated.

## 2011-04-20 NOTE — ED Notes (Signed)
[id number] Ready 

## 2011-04-20 NOTE — ED Provider Notes (Signed)
History     CSN: [id number]  Arrival date & time 04/20/11  1113   First MD Initiated Contact with Patient 04/20/11 1120      Chief Complaint  Patient presents with  . Abdominal Pain  . Fever    (Consider location/radiation/quality/duration/timing/severity/associated sxs/prior treatment) Patient is a 36 y.o. male presenting with abdominal pain and fever. The history is provided by the patient, a parent and the spouse. No language interpreter was used.  Abdominal Pain The primary symptoms of the illness include abdominal pain, fever and nausea. The primary symptoms of the illness do not include shortness of breath, vomiting or diarrhea. The current episode started 3 to 5 hours ago. The onset of the illness was gradual.  The patient states that she believes she is currently not pregnant. The patient has not had a change in bowel habit. Additional symptoms associated with the illness include diaphoresis. Symptoms associated with the illness do not include chills or back pain.  Fever Primary symptoms of the febrile illness include fever, abdominal pain and nausea. Primary symptoms do not include shortness of breath, vomiting or diarrhea.   36 year old male with MS but is bed ridden with quadriparesis due to muscle weakness.  His wife takes care of him at home. He was discharged from the hospital yesterday treated for unresponsiveness and urinary tract infection. Patient had a PEG tube placement on Monday with Dr. Janee Morn.  No tube feeding at home and was not trained on how to feed through the tube. Wife states that he has been holding his stomach with abdominal pain intermittently. States she has also had a fever at home 99.7 max. Is also on Diflucan for yeast infection in his groin area. Today she states that she cannot take care of him at home and he needs to go to a facility. NO PCP at this time. He does see Dr.  Hahn. Patient does have a Foley as well with cloudy urine. Patient's mother  states that the home health care [facility] constitute a house. States that Dr. Nash Shearer specific and see them today but she was sick. States that this ER trip is to get him into a facility because they are having a difficult time taking care of him at home. Social worker contacted. Patient answers simple yes and no questions by blinking once for yes and twice for no.    Past Medical History  Diagnosis Date  . MS (multiple sclerosis)   . UTI (urinary tract infection)   . 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 Childhood asthma   . Depression   . Neuromuscular disorder     Quadraperesis    Past Surgical History  Procedure Date  . Lumbar puncture 10/12/2002  . Gastrostomy 04/16/2011    Procedure: GASTROSTOMY;  Surgeon: Liz Malady, MD;  Location: [facility] OR;  Service: General;  Laterality: N/A;  Open G-Tube placement    Family History  Problem Relation Age of Onset  . Asthma Mother     History  Substance Use Topics  . Smoking status: Former Smoker    Types: Cigarettes    Quit date: 02/02/1999  . Smokeless tobacco: Former Neurosurgeon  . Alcohol Use: No      Review of Systems  Constitutional: Positive for fever and diaphoresis. Negative for chills.  Respiratory: Negative for shortness of breath.   Cardiovascular: Negative for chest pain.  Gastrointestinal: Positive for nausea and abdominal pain. Negative for vomiting and diarrhea.  Musculoskeletal: Negative for back pain.  Quadraplegia  Skin: Negative.   Neurological:       Quadroplegia  All other systems reviewed and are negative.    Allergies  Review of patient's allergies indicates no known allergies.  Home Medications   Current Outpatient Rx  Name Route Sig Dispense Refill  . BACLOFEN 20 MG PO TABS Oral Take 10 mg by mouth 3 (three) times daily.    Marland Kitchen VITAMIN D 1000 UNITS PO TABS Oral Take 1,000 Units by mouth 2 (two) times daily.     Marland Kitchen GARLIC 1000 MG PO CAPS Oral Take 1,000 mg by mouth daily.      Marland Kitchen  HYDROCODONE-ACETAMINOPHEN 7.5-500 MG/15ML PO SOLN Per Tube Place 10 mLs into feeding tube every 6 (six) hours as needed. For pain    . IBUPROFEN 200 MG PO TABS Oral Take 200 mg by mouth every 6 (six) hours as needed. For pain    . INTERFERON BETA-1B 0.3 MG [place] SOLR Subcutaneous Inject 0.3 mg into the skin every other day. Next dose due 04/20/2011    . NITROFURANTOIN MACROCRYSTAL 100 MG PO CAPS Oral Take 100 mg by mouth 2 (two) times daily.    . OMEGA-3-ACID ETHYL ESTERS 1 G PO CAPS Oral Take 1 g by mouth daily.    . SENNA 8.6 MG PO TABS Oral Take 2 tablets by mouth daily.    . OSMOLITE 1.5 CAL PO LIQD Per Tube Place 1,000 mLs into feeding tube continuous.      BP 103/73  Pulse 116  Temp(Src) 97.8 F (36.6 C) (Rectal)  Resp 12  SpO2 96%  Physical Exam  Nursing note and vitals reviewed. Constitutional: He is oriented to person, place, and time. He appears well-developed and well-nourished.  HENT:  Head: Normocephalic.  Eyes: Conjunctivae and EOM are normal. Pupils are equal, round, and reactive to light.  Neck: Normal range of motion. Neck supple.  Cardiovascular: Normal rate and intact distal pulses.   Pulmonary/Chest: Effort normal and breath sounds normal. No respiratory distress.  Abdominal: Soft. Bowel sounds are normal. He exhibits no distension. There is no tenderness.  Musculoskeletal: He exhibits tenderness.       Upper and lower extremity with no muscle tone LE contracted and upper extremities contracted to some extent.    Neurological: He is alert and oriented to person, place, and time. He has normal reflexes.  Skin: Skin is warm and dry.  Psychiatric: He has a normal mood and affect.    ED Course  Procedures (including critical care time)  Labs Reviewed  CBC - Abnormal; Notable for the following:    WBC 11.6 (*)    Hemoglobin 12.2 (*)    HCT 36.2 (*)    Platelets 469 (*)    All other components within normal limits  BASIC METABOLIC PANEL  URINALYSIS, ROUTINE W  REFLEX MICROSCOPIC   No results found.   No diagnosis found.    MDM  12:00  Case management called to get patient placed but social work will be contacted to start proceedings.   1:30 pm  Spoke with social will be here asap. 3:30pm  Child psychotherapist at bedside.  Doubtful he will get a placement today.  Family not prepared to take him home.  Pt has UTI and they want him re-admitted.  Also want to be trained on how to feed through the tube and they need TF at home.   1630  Hospitalist team 2 Dr Brien Few will admit for UTI and nursing home placement. Patient  was hydrated in the ER. And given 1 gram of rocephen IV.  Child psychotherapist for nhp.    Labs Reviewed  BASIC METABOLIC PANEL - Abnormal; Notable for the following:    Glucose, Bld 106 (*)    All other components within normal limits  CBC - Abnormal; Notable for the following:    WBC 11.6 (*)    Hemoglobin 12.2 (*)    HCT 36.2 (*)    Platelets 469 (*)    All other components within normal limits  URINALYSIS, ROUTINE W REFLEX MICROSCOPIC - Abnormal; Notable for the following:    Color, Urine AMBER (*) BIOCHEMICALS MAY BE AFFECTED BY COLOR   APPearance TURBID (*)    Hgb urine dipstick LARGE (*)    Ketones, ur 15 (*)    Protein, ur 100 (*)    Nitrite POSITIVE (*)    Leukocytes, UA LARGE (*)    All other components within normal limits  URINE MICROSCOPIC-ADD ON - Abnormal; Notable for the following:    Bacteria, UA FEW (*)    All other components within normal limits          Remi Haggard, NP 04/20/11 1652

## 2011-04-20 NOTE — ED Notes (Signed)
Family is at the bedside  

## 2011-04-21 ENCOUNTER — Inpatient Hospital Stay (HOSPITAL_COMMUNITY): Payer: Medicare HMO

## 2011-04-21 LAB — COMPREHENSIVE METABOLIC PANEL
AST: 17 U/L (ref 0–37)
Albumin: 2.5 g/dL — ABNORMAL LOW (ref 3.5–5.2)
Alkaline Phosphatase: 79 U/L (ref 39–117)
Chloride: 104 mEq/L (ref 96–112)
Potassium: 3.8 mEq/L (ref 3.5–5.1)
Total Bilirubin: 0.2 mg/dL — ABNORMAL LOW (ref 0.3–1.2)

## 2011-04-21 LAB — CBC
MCH: 27.4 pg (ref 26.0–34.0)
MCV: 83.1 fL (ref 78.0–100.0)
Platelets: 431 10*3/uL — ABNORMAL HIGH (ref 150–400)
RBC: 3.61 MIL/uL — ABNORMAL LOW (ref 4.22–5.81)
RDW: 14.3 % (ref 11.5–15.5)

## 2011-04-21 MED ORDER — WHITE PETROLATUM GEL
Status: AC
  Administered 2011-04-21: 10:00:00
  Filled 2011-04-21: qty 5

## 2011-04-21 MED ORDER — OSMOLITE 1.2 CAL PO LIQD
1000.0000 mL | ORAL | Status: DC
  Administered 2011-04-21 – 2011-04-30 (×7): 1000 mL
  Filled 2011-04-21 (×22): qty 1000

## 2011-04-21 MED ORDER — METOCLOPRAMIDE HCL 5 MG/ML IJ SOLN
5.0000 mg | Freq: Four times a day (QID) | INTRAMUSCULAR | Status: DC
  Administered 2011-04-21 – 2011-04-22 (×4): 5 mg via INTRAVENOUS
  Filled 2011-04-21 (×8): qty 1

## 2011-04-21 MED ORDER — BISACODYL 10 MG RE SUPP
10.0000 mg | Freq: Every day | RECTAL | Status: DC
  Administered 2011-04-21 – 2011-04-22 (×2): 10 mg via RECTAL
  Filled 2011-04-21 (×2): qty 1

## 2011-04-21 NOTE — Progress Notes (Signed)
PT Cancellation Note  Treatment cancelled today due to pt is at end stage MS and has been bedbound. Spoke with MD regarding PT needs. Will provide nurses with bed exercises and educate on frequency. Will not follow acutely. Please reorder if necessary. Thank you. 04/21/2011 Milana Kidney DPT PAGER: [phone] OFFICE: [phone]    Milana Kidney 04/21/2011, 11:38 AM

## 2011-04-21 NOTE — Progress Notes (Signed)
Nursing- secretary called for overlay mattress.

## 2011-04-21 NOTE — ED Provider Notes (Signed)
Medical screening examination/treatment/procedure(s) were performed by non-physician practitioner and as supervising physician I was immediately available for consultation/collaboration.  Nihal Marzella, MD 04/21/11 0707 

## 2011-04-21 NOTE — Progress Notes (Signed)
Nursing- PEG Residual checked before administering TF to pt. Residual found to be greater than . [facility] floor coverage notified. Orders given to hold TF until morning. Will cont to monitor pt.

## 2011-04-21 NOTE — Progress Notes (Signed)
INITIAL ADULT NUTRITION ASSESSMENT Date: 04/21/2011   Time: 11:23 AM Reason for Assessment: New TF  ASSESSMENT: Male 36 y.o.  Dx: UTI (lower urinary tract infection)  Hx:  Past Medical History  Diagnosis Date  . MS (multiple sclerosis)   . UTI (urinary tract infection)   . 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 Childhood asthma   . Depression   . Neuromuscular disorder     Quadraperesis   Related Meds:     . baclofen  10 mg Per Tube TID  . cefTRIAXone (ROCEPHIN)  IV  1 g Intravenous Once  . enoxaparin  40 mg Subcutaneous Q24H  . free water  150 mL Per Tube 6 X Daily  . imipenem-cilastatin  250 mg Intravenous Once  . imipenem-cilastatin  250 mg Intravenous Q6H  . interferon beta-1b  0.3 mg Subcutaneous QODAY  . senna  2 tablet Per Tube Daily  . sodium chloride  1,000 mL Intravenous Once  . vancomycin  750 mg Intravenous Q12H  . vancomycin  1,000 mg Intravenous Once  . white petrolatum      . DISCONTD: sodium chloride   Intravenous STAT   Ht: 6\' 4"  (193 cm)  Wt: 160 lbs (72.7 kg)  Ideal Wt: 78 - 82.6 kg % Ideal Wt: 93%  Usual Wt:  04/10/11: 148 lbs Wt Readings from Last 10 Encounters:  04/18/11 160 lb 15 oz (73 kg)  04/18/11 160 lb 15 oz (73 kg)  03/26/11 186 lb 15.2 oz (84.8 kg)  03/26/11 186 lb 15.2 oz (84.8 kg)  02/01/11 155 lb 13.8 oz (70.7 kg)  12/26/10 166 lb 14.4 oz (75.705 kg)    % Usual Wt: >100%  BMI: 19.5 WNL  Food/Nutrition Related Hx:   Per H&P this is a 36 year old male, with known history of multiple sclerosis, quadriparesis and neurogenic bladder, on chronic Foley catheter, bedbound, stage 2 sacral decubitus, recurrent UTIs on prophylactic Macrobid, hospitalized from 04/10/11- 04/19/11, with FFT, dehydration, fungal UTI and altered mental status. Patient has chronic dysphagia, and aspiration. In the recent past, he had been evaluated by IR and GI for peg tube placement and given his complex anatomy, was recommended surgery for PEG.  Gastrostomy tube was placed surgically on 04/16/11, by Dr Violeta Gelinas. Patient was discharged in satisfactery condition. Family is not present in the ED at this time, and at baseline, patient is nonverbal, nodding or shaking his head, and/or blinking once for no and twice for yes. History is gleaned mainly from ED PA, and patient nodded in affirmative, as i repeated the information I had. It appears the he became quite sweaty and ran a temperature on 04/19/11, after getting home. Today, he has become worse, and developed a rapid heart rate. He was therefore, brought to the ED. Pt currently lives at home with family but noted plans to d/c to SNF. Per records pt uses marijuana and cocaine. Previous TF regimen:Osmolite 1.5 at a rte of 85 ml/hr over 14 hours, (8 pm-10 am) via G tube. Also Water flushes, 150 ml 6 times daily.  This will provide 1785 kcal, 75 gm protein, and 1806 ml water daily.   Spoke with pt's mother, per her account pt has gained some wt since starting TF. She reports that pt went home on Thursday and no one delivered pt's TF.  TF currently held for abd CT. Spoke with Dr. Brien Few, who plans to resume TF once CT completed. Asked to leave TF recommendations in the chart. Pt with  hx of severe malnutrition in the context of chronic illness. Pt with 3 stage 2 wounds on his sacrum. WOC RN consult pending.  Labs:  CMP     Component Value Date/Time   NA 136 04/21/2011 0640   K 3.8 04/21/2011 0640   CL 104 04/21/2011 0640   CO2 22 04/21/2011 0640   GLUCOSE 86 04/21/2011 0640   BUN 8 04/21/2011 0640   CREATININE 0.63 04/21/2011 0640   CALCIUM 8.4 04/21/2011 0640   PROT 5.8* 04/21/2011 0640   ALBUMIN 2.5* 04/21/2011 0640   AST 17 04/21/2011 0640   ALT 12 04/21/2011 0640   ALKPHOS 79 04/21/2011 0640   BILITOT 0.2* 04/21/2011 0640   GFRNONAA >90 04/21/2011 0640   GFRAA >90 04/21/2011 0640   No intake or output data in the 24 hours ending 04/21/11 1128  Diet Order: NPO  Supplements/Tube Feeding:  Osmolite 1.5 @ 85 ml/hr via PEG, providing: 3060 kcals, 128 grams protein, 1554 ml H2O Free water: 150 ml every 4 hours: 900 ml H2O Total water: 2454 ml/day  IVF:    sodium chloride Last Rate: 125 mL/hr at 04/21/11 0446  feeding supplement (OSMOLITE 1.5 CAL)   DISCONTD: feeding supplement (OSMOLITE 1.5 CAL)     Estimated Nutritional Needs:   Kcal: 1700-1900 Protein: 80-95 grams Fluid: >2L/day  NUTRITION DIAGNOSIS: -Swallowing difficulty (NI-1.1).  Status: Ongoing  RELATED TO: multiple sclerosis  AS EVIDENCE BY: NPO status, need for alternate nutrition  MONITORING/EVALUATION(Goals): Goal: Provide >/= 90 % of his estimated needs Monitor: weight, labs  EDUCATION NEEDS: -No education needs identified at this time  INTERVENTION:  Recommend once TF can resume to change formula to Osmolite 1.2 with goal rate of 60 ml/hr (will provide 1728 kcals, 80 grams protein, 1166 ml H2O)  Dietitian #:[id number]  DOCUMENTATION CODES Per approved criteria  -Not Applicable    Kendell Bane Cornelison 04/21/2011, 11:23 AM

## 2011-04-21 NOTE — Progress Notes (Signed)
Nursing- Dr Eppie Gibson (radiologist) called critical value of free air in RUQ pt abdomen at 2141. Paged  [facility] Floor coverage Burnadette Peter). Page returned at 2145, critical value relayed. No new orders received.

## 2011-04-21 NOTE — Progress Notes (Addendum)
Subjective: No new issues overnight. Feels better today.  Objective: Vital signs in last 24 hours: Temp:  [97.5 F (36.4 C)-98.5 F (36.9 C)] 97.5 F (36.4 C) (04/13 0952) Pulse Rate:  [92-116] 96  (04/13 0952) Resp:  [12-18] 18  (04/13 0952) BP: (100-133)/(66-85) 129/84 mmHg (04/13 0952) SpO2:  [96 %-99 %] 96 % (04/13 0952) Weight change:     Intake/Output from previous day:       Physical Exam: General: Appears comfortable, not short of breath at rest, following simple commands.  HEENT: No clinical pallor, no jaundice, no conjunctival injection or discharge. Hydration status appears fair.  NECK: Supple, JVP not seen, no carotid bruits, no palpable lymphadenopathy, no palpable goiter.  CHEST: Clinically clear to auscultation, no wheezes, no crackles.  HEART: Sounds 1 and 2 heard, normal, regular, mildly tachycardic, no murmurs.  ABDOMEN: Flat, soft, non-tender, no palpable organomegaly, no palpable masses, normal bowel sounds. Peg site is unremarkable, and dressings are clean.  GENITALIA: Not examined. Patient has healing stage 2 sacral decubitus.  LOWER EXTREMITIES: No pitting edema, palpable peripheral pulses.  MUSCULOSKELETAL SYSTEM: Unremarkable.  CENTRAL NERVOUS SYSTEM: Quadriparetic.  Lab Results:  [facility] 04/21/11 0640 04/20/11 2105  WBC 6.8 7.7  HGB 9.9* 11.0*  HCT 30.0* 32.9*  PLT 431* 468*    Basename 04/21/11 0640 04/20/11 2105 04/20/11 1149  NA 136 -- 135  K 3.8 -- 3.8  CL 104 -- 101  CO2 22 -- 20  GLUCOSE 86 -- 106*  BUN 8 -- 11  CREATININE 0.63 0.72 --  CALCIUM 8.4 -- 9.1   No results found for this or any previous visit (from the past 240 hour(s)).   Studies/Results: Dg Chest Port 1 View  04/20/2011  *RADIOLOGY REPORT*  Clinical Data: Fever.  Percutaneous gastrostomy tube placement several days ago.  PORTABLE CHEST - 1 VIEW  Comparison: 04/10/2011  Findings: A moderate amount of free intraperitoneal air is seen in the right upper quadrant. This  is likely due to recent percutaneous gastrostomy tube placement.  No evidence of pneumothorax or pleural effusion.  Low lung volumes are seen however both lungs are clear.  Heart size is normal.  IMPRESSION:  1.  No active cardiopulmonary disease. 2.  Pneumoperitoneum, likely due to given history of recent percutaneous gastrostomy tube placement.  Critical Value/emergent results were called by telephone at the time of interpretation on 04/20/2011  at 2145 hours  to  the patient's floor nurse Terry Palmer on 3100, who verbally acknowledged these results. .  Original Report Authenticated By: Terry Palmer, M.D.    Medications: Scheduled Meds:   . baclofen  10 mg Per Tube TID  . cefTRIAXone (ROCEPHIN)  IV  1 g Intravenous Once  . enoxaparin  40 mg Subcutaneous Q24H  . free water  150 mL Per Tube 6 X Daily  . imipenem-cilastatin  250 mg Intravenous Once  . imipenem-cilastatin  250 mg Intravenous Q6H  . interferon beta-1b  0.3 mg Subcutaneous QODAY  . senna  2 tablet Per Tube Daily  . sodium chloride  1,000 mL Intravenous Once  . vancomycin  750 mg Intravenous Q12H  . vancomycin  1,000 mg Intravenous Once  . white petrolatum      . DISCONTD: sodium chloride   Intravenous STAT   Continuous Infusions:   . sodium chloride 125 mL/hr at 04/21/11 0446  . feeding supplement (OSMOLITE 1.5 CAL)    . DISCONTD: feeding supplement (OSMOLITE 1.5 CAL)     PRN Meds:.acetaminophen (TYLENOL)  oral liquid 160 mg/5 mL, HYDROcodone-acetaminophen, HYDROmorphone, ondansetron (ZOFRAN) IV, ondansetron  Assessment/Plan: Principal Problem:  *UTI (lower urinary tract infection): Patient has a positive urinary sediment, with marked pyuria, consistent with a UTI. He has just completed a 7-day course of Diflucan for funguria, during his recent hospitalization. ED PA gave a single dose of iv Rocephin in the ED, but given his history of recurrences, chronic Foley catheter and prophylactic Macrobid, this is a complicated UTI, and I  suspect a resistant organism may be responsible. We shall therefore, treat with Vancomycin/Primaxin now day# 2, until cultures/sensitivities are available.  Active Problems:  1. Sepsis: Patient appeared to have SIRS, and possibly even early sepsis on presentation. He had a leukocytosis, tachycardia and borderline low BP and was reportedly, feverish at home. Septic workup, is in progress including urine cultures, blood cultures and CXR. Lactic acid is 1.3 and procalcitonin is <0.10, effectively ruling out sepsis.  2. Multiple sclerosis: This is advanced MS, and patient is under the care of Terry Palmer, neurologist, and is on Betaseron, which we have continued.  3. FTT (failure to thrive) in adult/Protein calorie malnutrition: This is occasioned by chronic dysphagia, due to multiple sclerosis, and is being addressed with artificial nutrition, via PEG tube.  4. Sacral decubitus ulcer, stage II: Patient does have a healing stage 2 sacral decubitus, which appears well granulated and un-infected. We have requested WOC consult, and have obtained appropriate mattress overlay.  5. Pneumoperitoneum. This was an incidental finding on CXR, and was not seen on single view AXR. It is likely to be a legacy of patient's surgical PEG placement on 04/16/11. Have discussed with radiologist, and an acute abdominal series has been recommended, to evaluate for progression. Patient has no abdominal tenderness, and bowel sounds are heard. I have discussed with Terry Palmer, and he has assured me that following such a procedure, a pneumoperitoneum is anticipated for 7-10 days.   Comment: Of note the family has refused hospice services and palliative care consult in the past. They reversed his DNR during recent admission and continue to wish patient to be FULL CODE.  Per my discussion with Terry Haggard PA, family now want patient placed.   Patient had 300cc residual on tube feeding. Will start Reglan.     LOS: 1 day    Terry Palmer,Terry 04/21/2011, 11:13 AM

## 2011-04-22 LAB — CBC
HCT: 30 % — ABNORMAL LOW (ref 39.0–52.0)
Hemoglobin: 10.2 g/dL — ABNORMAL LOW (ref 13.0–17.0)
MCH: 27.9 pg (ref 26.0–34.0)
MCHC: 34 g/dL (ref 30.0–36.0)
MCV: 82.2 fL (ref 78.0–100.0)
Platelets: 409 K/uL — ABNORMAL HIGH (ref 150–400)
RBC: 3.65 MIL/uL — ABNORMAL LOW (ref 4.22–5.81)
RDW: 14.1 % (ref 11.5–15.5)
WBC: 7.9 K/uL (ref 4.0–10.5)

## 2011-04-22 LAB — COMPREHENSIVE METABOLIC PANEL WITH GFR
ALT: 12 U/L (ref 0–53)
AST: 19 U/L (ref 0–37)
Albumin: 2.5 g/dL — ABNORMAL LOW (ref 3.5–5.2)
Alkaline Phosphatase: 70 U/L (ref 39–117)
BUN: 4 mg/dL — ABNORMAL LOW (ref 6–23)
CO2: 21 meq/L (ref 19–32)
Calcium: 8.5 mg/dL (ref 8.4–10.5)
Chloride: 106 meq/L (ref 96–112)
Creatinine, Ser: 0.65 mg/dL (ref 0.50–1.35)
GFR calc Af Amer: >90 mL/min
GFR calc non Af Amer: >90 mL/min
Glucose, Bld: 102 mg/dL — ABNORMAL HIGH (ref 70–99)
Potassium: 3.1 meq/L — ABNORMAL LOW (ref 3.5–5.1)
Sodium: 138 meq/L (ref 135–145)
Total Bilirubin: 0.2 mg/dL — ABNORMAL LOW (ref 0.3–1.2)
Total Protein: 5.7 g/dL — ABNORMAL LOW (ref 6.0–8.3)

## 2011-04-22 MED ORDER — POTASSIUM CHLORIDE 20 MEQ/15ML (10%) PO LIQD
40.0000 meq | Freq: Every day | ORAL | Status: DC
  Administered 2011-04-22 – 2011-04-25 (×4): 40 meq
  Filled 2011-04-22 (×4): qty 30

## 2011-04-22 MED ORDER — METOCLOPRAMIDE HCL 5 MG/5ML PO SOLN
10.0000 mg | Freq: Four times a day (QID) | ORAL | Status: DC
Start: 2011-04-22 — End: 2011-05-03
  Administered 2011-04-22 – 2011-05-03 (×42): 10 mg
  Filled 2011-04-22 (×50): qty 10

## 2011-04-22 NOTE — Progress Notes (Signed)
Subjective: No new issues. Moved bowels several times.  Objective: Vital signs in last 24 hours: Temp:  [98.1 F (36.7 C)] 98.1 F (36.7 C) (04/13 2014) Pulse Rate:  [114] 114  (04/13 2014) Resp:  [18] 18  (04/13 2014) BP: (130)/(73) 130/73 mmHg (04/13 2014) SpO2:  [100 %] 100 % (04/13 2014) Weight change:  Last BM Date: 04/22/11  Intake/Output from previous day: 04/13 0701 - 04/14 0700 In: 0  Out: 1200 [Urine:1200]     Physical Exam: General: Appears comfortable, not short of breath at rest, following simple commands.  HEENT: No clinical pallor, no jaundice, no conjunctival injection or discharge. Hydration status appears fair.  NECK: Supple, JVP not seen, no carotid bruits, no palpable lymphadenopathy, no palpable goiter.  CHEST: Clinically clear to auscultation, no wheezes, no crackles.  HEART: Sounds 1 and 2 heard, normal, regular, mildly tachycardic, no murmurs.  ABDOMEN: Flat, soft, non-tender, no palpable organomegaly, no palpable masses, normal bowel sounds. Peg site is unremarkable, and dressings are clean.  GENITALIA: Not examined. Patient has healing stage 2 sacral decubitus.  LOWER EXTREMITIES: No pitting edema, palpable peripheral pulses.  MUSCULOSKELETAL SYSTEM: Unremarkable.  CENTRAL NERVOUS SYSTEM: Quadriparetic.  Lab Results:  [facility] 04/22/11 0626 04/21/11 0640  WBC 7.9 6.8  HGB 10.2* 9.9*  HCT 30.0* 30.0*  PLT 409* 431*    Basename 04/22/11 0626 04/21/11 0640  NA 138 136  K 3.1* 3.8  CL 106 104  CO2 21 22  GLUCOSE 102* 86  BUN 4* 8  CREATININE 0.65 0.63  CALCIUM 8.5 8.4   Recent Results (from the past 240 hour(s))  CULTURE, BLOOD (ROUTINE X 2)     Status: Normal (Preliminary result)   Collection Time   04/20/11  9:05 PM      Component Value Range Status Comment   Specimen Description BLOOD RIGHT ARM   Final    Special Requests BOTTLES DRAWN AEROBIC AND ANAEROBIC 10CC EACH   Final    Culture  Setup Time [id number]   Final    Culture      Final    Value:        BLOOD CULTURE RECEIVED NO GROWTH TO DATE CULTURE WILL BE HELD FOR 5 DAYS BEFORE ISSUING A FINAL NEGATIVE REPORT   Report Status PENDING   Incomplete   CULTURE, BLOOD (ROUTINE X 2)     Status: Normal (Preliminary result)   Collection Time   04/20/11  9:14 PM      Component Value Range Status Comment   Specimen Description BLOOD RIGHT HAND   Final    Special Requests BOTTLES DRAWN AEROBIC AND ANAEROBIC 10CC EACH   Final    Culture  Setup Time [id number]   Final    Culture     Final    Value:        BLOOD CULTURE RECEIVED NO GROWTH TO DATE CULTURE WILL BE HELD FOR 5 DAYS BEFORE ISSUING A FINAL NEGATIVE REPORT   Report Status PENDING   Incomplete      Studies/Results: Dg Chest Port 1 View  04/20/2011  *RADIOLOGY REPORT*  Clinical Data: Fever.  Percutaneous gastrostomy tube placement several days ago.  PORTABLE CHEST - 1 VIEW  Comparison: 04/10/2011  Findings: A moderate amount of free intraperitoneal air is seen in the right upper quadrant. This is likely due to recent percutaneous gastrostomy tube placement.  No evidence of pneumothorax or pleural effusion.  Low lung volumes are seen however both lungs are clear.  Heart size  is normal.  IMPRESSION:  1.  No active cardiopulmonary disease. 2.  Pneumoperitoneum, likely due to given history of recent percutaneous gastrostomy tube placement.  Critical Value/emergent results were called by telephone at the time of interpretation on 04/20/2011  at 2145 hours  to  the patient's floor nurse [name] on 3100, who verbally acknowledged these results. .  Original Report Authenticated By: Danae Orleans, M.D.   Dg Abd 2 Views  04/21/2011  *RADIOLOGY REPORT*  Clinical Data: Evaluate pneumoperitoneum  ABDOMEN - 2 VIEW  Comparison: Supine abdominal radiograph dated 04/21/2011.  Semi- erect chest radiograph dated 04/20/2011.  Findings: Gastrostomy tube.  Overlying skin staples.  Nonobstructive bowel gas pattern.  Pneumoperitoneum on the decubitus  radiograph, although not expected given the history of recent gastrostomy placement.  Given differences in technique, the amount cannot be directly compared to the prior radiographs.  Visualized osseous structures are within normal limits.  IMPRESSION: Pneumoperitoneum on the decubitus radiograph, not unexpected given the history of recent gastrostomy placement.  Follow-up radiographs (performed with a consistent technique) could be performed to confirm that the amount of free air is decreasing.  If the patient develops symptoms of abdominal pain, CT abdomen/pelvis should be considered.  These findings were discussed in person with the referring physician at the time of dictation.  Original Report Authenticated By: Charline Bills, M.D.   Dg Abd Portable 1v  04/21/2011  *RADIOLOGY REPORT*  Clinical Data: Recent gastrostomy tube placement  PORTABLE ABDOMEN - 1 VIEW  Comparison: Chest film 04/12 1013  Findings: There are skin staples over the midabdomen.  Percutaneous gastrostomy tube is in the expected location of the stomach. Intraperitoneal free air is difficult to evaluate on this supine exam.  IMPRESSION: No evidence of  complication following percutaneous gastrostomy tube placement.  Recommend follow-up acute abdominal series to further assess free air seen on chest radiograph.  Original Report Authenticated By: Genevive Bi, M.D.    Medications: Scheduled Meds:    . baclofen  10 mg Per Tube TID  . bisacodyl  10 mg Rectal Daily  . enoxaparin  40 mg Subcutaneous Q24H  . free water  150 mL Per Tube 6 X Daily  . imipenem-cilastatin  250 mg Intravenous Q6H  . interferon beta-1b  0.3 mg Subcutaneous QODAY  . metoCLOPramide (REGLAN) injection  5 mg Intravenous Q6H  . senna  2 tablet Per Tube Daily  . vancomycin  750 mg Intravenous Q12H   Continuous Infusions:    . sodium chloride 125 mL/hr at 04/22/11 0202  . feeding supplement (OSMOLITE 1.2 CAL) 1,000 mL (04/21/11 2046)  . DISCONTD:  feeding supplement (OSMOLITE 1.5 CAL)     PRN Meds:.acetaminophen (TYLENOL) oral liquid 160 mg/5 mL, HYDROcodone-acetaminophen, HYDROmorphone, ondansetron (ZOFRAN) IV, ondansetron  Assessment/Plan: Principal Problem:  *UTI (lower urinary tract infection): Patient has a positive urinary sediment, with marked pyuria, consistent with a UTI. He has just completed a 7-day course of Diflucan for funguria, during his recent hospitalization. ED PA gave a single dose of iv Rocephin in the ED, but given his history of recurrences, chronic Foley catheter and prophylactic Macrobid, this is a complicated UTI, and I suspect a resistant organism may be responsible. We shall therefore, treat with Vancomycin/Primaxin now day# 3, until cultures/sensitivities are available.  Active Problems:  1. Query Sepsis: Patient appeared to have SIRS, and possibly even early sepsis on presentation. He had a leukocytosis, tachycardia and borderline low BP and was reportedly, feverish at home. Septic workup, is in progress including  urine cultures, blood cultures and CXR. Lactic acid is 1.3 and procalcitonin is <0.10, effectively ruling out sepsis.  2. Multiple sclerosis: This is advanced MS, and patient is under the care of Dr Lesia Sago, neurologist, and is on Betaseron, which we have continued.  3. FTT (failure to thrive) in adult/Protein calorie malnutrition: This is occasioned by chronic dysphagia, due to multiple sclerosis, and is being addressed with artificial nutrition, via PEG tube.  4. Sacral decubitus ulcer, stage II: Patient does have a healing stage 2 sacral decubitus, which appears well granulated and un-infected. We have requested WOC consult, and have obtained appropriate mattress overlay.  5. Pneumoperitoneum. This was an incidental finding on CXR, and was not seen on single view AXR. It is likely to be a legacy of patient's surgical PEG placement on 04/16/11. Have discussed with radiologist, and an acute abdominal  series has been recommended, to evaluate for progression. Patient has no abdominal tenderness, and bowel sounds are heard. I have discussed with Dr Violeta Gelinas, and he has assured me that following such a procedure, a pneumoperitoneum is anticipated for 7-10 days. We anticipate a repeat radiologic study on 04/24/11. 6. Diarrhea: Patent has had several bowel movements today, and while this may be a consequence of his artificial nutrition, there is concern for C.Difficle colitis, given recent and on-going antibiotic therapy. Will discontinue laxatives, and check C.Difficile PCR. 7. Bladder Calculi: Patient was found to have multiple bladder calculi on Pelvic CT scan of 03/12/11. This, in addition to neurogenic bladder, this is obviously yet another predisposition to UTIs. Will consult Urology, for recommendations.  Comment: Of note the family has refused hospice services and palliative care consult in the past. They reversed his DNR during recent admission and continue to wish patient to be FULL CODE.  Per my discussion with Remi Haggard PA, family now want patient placed.       LOS: 2 days   Amyra Vantuyl,CHRISTOPHER 04/22/2011, 11:11 AM

## 2011-04-22 NOTE — Consult Note (Signed)
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  History of Present Illness: Patient is a very unfortunate 36 year old male who has had progressive multiple sclerosis. Urology was consulted and he was seen approximately a month ago by Dr. Retta Diones. Counseled at that time was for similar reasons with bladder calculi. [name] has had multiple admissions for urinary tract infection and urosepsis. 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 They have apparently wanted him to continue to be a fold. Patient was noted to have some bladder calculi from admission one month ago. He has had a Foley catheter now for the last month. Dr. Retta Diones at that time did not feel that he would really benefit from removal was bladder stones and he would be a difficult situation given his debilitated state. The patient has been readmitted now with another febrile urinary tract infection. Patient's currently line in bed and is noncommunicative. He has an indwelling stent 16 French Foley draining clear urine. Past Medical History  Diagnosis Date  . MS (multiple sclerosis)   . UTI (urinary tract infection)   . 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 Childhood asthma   . Depression   . Neuromuscular disorder     Quadraperesis   Past Surgical History  Procedure Date  . Lumbar puncture 10/12/2002  . Gastrostomy 04/16/2011    Procedure: GASTROSTOMY;  Surgeon: Liz Malady, MD;  Location: [facility] OR;  Service: General;  Laterality: N/A;  Open G-Tube placement    Medications:  Scheduled:   . baclofen  10 mg Per Tube TID  . enoxaparin  40 mg Subcutaneous Q24H  . free water  150 mL Per Tube 6 X Daily  . imipenem-cilastatin  250 mg Intravenous Q6H  . interferon beta-1b  0.3 mg Subcutaneous QODAY  . metoCLOPramide  10 mg Per Tube Q6H  . potassium  chloride  40 mEq Per Tube Daily  . vancomycin  750 mg Intravenous Q12H  . DISCONTD: bisacodyl  10 mg Rectal Daily  . DISCONTD: metoCLOPramide (REGLAN) injection  5 mg Intravenous Q6H  . DISCONTD: senna  2 tablet Per Tube Daily    Allergies: No Known Allergies  Family History  Problem Relation Age of Onset  . Asthma Mother     Social History:  reports that he quit smoking about 12 years ago. His smoking use included Cigarettes. He has quit using smokeless tobacco. He reports that he uses illicit drugs (Marijuana and Cocaine). He reports that he does not drink alcohol.  Not obtainable  Physical Exam:  Vital signs in last 24 hours: Temp:  [98 F (36.7 C)-98.1 F (36.7 C)] 98 F (36.7 C) (04/14 1500) Pulse Rate:  [91-114] 91  (04/14 1500) Resp:  [18] 18  (04/14 1500) BP: (114-130)/(73-76) 114/76 mmHg (04/14 1500) SpO2:  [93 %-100 %] 93 % (04/14 1500)  Constitutional: Vital signs reviewed. WD WN in NAD Head: Normocephalic and atraumatic   Eyes: PERRL, No scleral icterus.  Neck: Supple No  Gross JVD, mass, thyromegaly, or carotid bruit present.  Cardiovascular: RRR Pulmonary/Chest: Normal effort Abdominal: Soft. Non-tender, non-distended Genitourinary: Indwelling Foley catheter draining clear urine. Extremities: Markedly contracted lower extremities     Laboratory Data:  Results for orders placed during the hospital encounter of 04/20/11 (from the past 72 hour(s))  BASIC METABOLIC PANEL     Status: Abnormal   Collection Time  04/20/11 11:49 AM      Component Value Range Comment   Sodium 135  135 - 145 (mEq/L)    Potassium 3.8  3.5 - 5.1 (mEq/L)    Chloride 101  96 - 112 (mEq/L)    CO2 20  19 - 32 (mEq/L)    Glucose, Bld 106 (*) 70 - 99 (mg/dL)    BUN 11  6 - 23 (mg/dL)    Creatinine, Ser 0.86  0.50 - 1.35 (mg/dL)    Calcium 9.1  8.4 - 10.5 (mg/dL)    GFR calc non Af Amer >90  >90 (mL/min)    GFR calc Af Amer >90  >90 (mL/min)   CBC     Status: Abnormal   Collection  Time   04/20/11 11:49 AM      Component Value Range Comment   WBC 11.6 (*) 4.0 - 10.5 (K/uL)    RBC 4.37  4.22 - 5.81 (MIL/uL)    Hemoglobin 12.2 (*) 13.0 - 17.0 (g/dL)    HCT 57.8 (*) 46.9 - 52.0 (%)    MCV 82.8  78.0 - 100.0 (fL)    MCH 27.9  26.0 - 34.0 (pg)    MCHC 33.7  30.0 - 36.0 (g/dL)    RDW 62.9  52.8 - 41.3 (%)    Platelets 469 (*) 150 - 400 (K/uL)   URINALYSIS, ROUTINE W REFLEX MICROSCOPIC     Status: Abnormal   Collection Time   04/20/11 12:49 PM      Component Value Range Comment   Color, Urine AMBER (*) YELLOW  BIOCHEMICALS MAY BE AFFECTED BY COLOR   APPearance TURBID (*) CLEAR     Specific Gravity, Urine 1.029  1.005 - 1.030     pH 6.0  5.0 - 8.0     Glucose, UA NEGATIVE  NEGATIVE (mg/dL)    Hgb urine dipstick LARGE (*) NEGATIVE     Bilirubin Urine NEGATIVE  NEGATIVE     Ketones, ur 15 (*) NEGATIVE (mg/dL)    Protein, ur 244 (*) NEGATIVE (mg/dL)    Urobilinogen, UA 0.2  0.0 - 1.0 (mg/dL)    Nitrite POSITIVE (*) NEGATIVE     Leukocytes, UA LARGE (*) NEGATIVE    URINE MICROSCOPIC-ADD ON     Status: Abnormal   Collection Time   04/20/11 12:49 PM      Component Value Range Comment   WBC, UA TOO NUMEROUS TO COUNT  <3 (WBC/hpf)    RBC / HPF 7-10  <3 (RBC/hpf)    Bacteria, UA FEW (*) RARE    CULTURE, BLOOD (ROUTINE X 2)     Status: Normal (Preliminary result)   Collection Time   04/20/11  9:05 PM      Component Value Range Comment   Specimen Description BLOOD RIGHT ARM      Special Requests BOTTLES DRAWN AEROBIC AND ANAEROBIC 10CC EACH      Culture  Setup Time [id number]      Culture        Value:        BLOOD CULTURE RECEIVED NO GROWTH TO DATE CULTURE WILL BE HELD FOR 5 DAYS BEFORE ISSUING A FINAL NEGATIVE REPORT   Report Status PENDING     LACTIC ACID, PLASMA     Status: Normal   Collection Time   04/20/11  9:05 PM      Component Value Range Comment   Lactic Acid, Venous 1.3  0.5 - 2.2 (mmol/L)   PROCALCITONIN  Status: Normal   Collection Time   04/20/11   9:05 PM      Component Value Range Comment   Procalcitonin <0.10     CBC     Status: Abnormal   Collection Time   04/20/11  9:05 PM      Component Value Range Comment   WBC 7.7  4.0 - 10.5 (K/uL)    RBC 3.94 (*) 4.22 - 5.81 (MIL/uL)    Hemoglobin 11.0 (*) 13.0 - 17.0 (g/dL)    HCT 16.1 (*) 09.6 - 52.0 (%)    MCV 83.5  78.0 - 100.0 (fL)    MCH 27.9  26.0 - 34.0 (pg)    MCHC 33.4  30.0 - 36.0 (g/dL)    RDW 04.5  40.9 - 81.1 (%)    Platelets 468 (*) 150 - 400 (K/uL)   CREATININE, SERUM     Status: Normal   Collection Time   04/20/11  9:05 PM      Component Value Range Comment   Creatinine, Ser 0.72  0.50 - 1.35 (mg/dL)    GFR calc non Af Amer >90  >90 (mL/min)    GFR calc Af Amer >90  >90 (mL/min)   MAGNESIUM     Status: Normal   Collection Time   04/20/11  9:05 PM      Component Value Range Comment   Magnesium 2.1  1.5 - 2.5 (mg/dL)   CULTURE, BLOOD (ROUTINE X 2)     Status: Normal (Preliminary result)   Collection Time   04/20/11  9:14 PM      Component Value Range Comment   Specimen Description BLOOD RIGHT HAND      Special Requests BOTTLES DRAWN AEROBIC AND ANAEROBIC 10CC EACH      Culture  Setup Time [id number]      Culture        Value:        BLOOD CULTURE RECEIVED NO GROWTH TO DATE CULTURE WILL BE HELD FOR 5 DAYS BEFORE ISSUING A FINAL NEGATIVE REPORT   Report Status PENDING     COMPREHENSIVE METABOLIC PANEL     Status: Abnormal   Collection Time   04/21/11  6:40 AM      Component Value Range Comment   Sodium 136  135 - 145 (mEq/L)    Potassium 3.8  3.5 - 5.1 (mEq/L)    Chloride 104  96 - 112 (mEq/L)    CO2 22  19 - 32 (mEq/L)    Glucose, Bld 86  70 - 99 (mg/dL)    BUN 8  6 - 23 (mg/dL)    Creatinine, Ser 0.86  0.50 - 1.35 (mg/dL)    Calcium 8.4  8.4 - 10.5 (mg/dL)    Total Protein 5.8 (*) 6.0 - 8.3 (g/dL)    Albumin 2.5 (*) 3.5 - 5.2 (g/dL)    AST 17  0 - 37 (U/L)    ALT 12  0 - 53 (U/L)    Alkaline Phosphatase 79  39 - 117 (U/L)    Total Bilirubin 0.2 (*)  0.3 - 1.2 (mg/dL)    GFR calc non Af Amer >90  >90 (mL/min)    GFR calc Af Amer >90  >90 (mL/min)   CBC     Status: Abnormal   Collection Time   04/21/11  6:40 AM      Component Value Range Comment   WBC 6.8  4.0 - 10.5 (K/uL)    RBC 3.61 (*) 4.22 -  5.81 (MIL/uL)    Hemoglobin 9.9 (*) 13.0 - 17.0 (g/dL)    HCT 47.8 (*) 29.5 - 52.0 (%)    MCV 83.1  78.0 - 100.0 (fL)    MCH 27.4  26.0 - 34.0 (pg)    MCHC 33.0  30.0 - 36.0 (g/dL)    RDW 62.1  30.8 - 65.7 (%)    Platelets 431 (*) 150 - 400 (K/uL)   CBC     Status: Abnormal   Collection Time   04/22/11  6:26 AM      Component Value Range Comment   WBC 7.9  4.0 - 10.5 (K/uL)    RBC 3.65 (*) 4.22 - 5.81 (MIL/uL)    Hemoglobin 10.2 (*) 13.0 - 17.0 (g/dL)    HCT 84.6 (*) 96.2 - 52.0 (%)    MCV 82.2  78.0 - 100.0 (fL)    MCH 27.9  26.0 - 34.0 (pg)    MCHC 34.0  30.0 - 36.0 (g/dL)    RDW 95.2  84.1 - 32.4 (%)    Platelets 409 (*) 150 - 400 (K/uL)   COMPREHENSIVE METABOLIC PANEL     Status: Abnormal   Collection Time   04/22/11  6:26 AM      Component Value Range Comment   Sodium 138  135 - 145 (mEq/L)    Potassium 3.1 (*) 3.5 - 5.1 (mEq/L)    Chloride 106  96 - 112 (mEq/L)    CO2 21  19 - 32 (mEq/L)    Glucose, Bld 102 (*) 70 - 99 (mg/dL)    BUN 4 (*) 6 - 23 (mg/dL)    Creatinine, Ser 4.01  0.50 - 1.35 (mg/dL)    Calcium 8.5  8.4 - 10.5 (mg/dL)    Total Protein 5.7 (*) 6.0 - 8.3 (g/dL)    Albumin 2.5 (*) 3.5 - 5.2 (g/dL)    AST 19  0 - 37 (U/L)    ALT 12  0 - 53 (U/L)    Alkaline Phosphatase 70  39 - 117 (U/L)    Total Bilirubin 0.2 (*) 0.3 - 1.2 (mg/dL)    GFR calc non Af Amer >90  >90 (mL/min)    GFR calc Af Amer >90  >90 (mL/min)    Recent Results (from the past 240 hour(s))  CULTURE, BLOOD (ROUTINE X 2)     Status: Normal (Preliminary result)   Collection Time   04/20/11  9:05 PM      Component Value Range Status Comment   Specimen Description BLOOD RIGHT ARM   Final    Special Requests BOTTLES DRAWN AEROBIC AND ANAEROBIC  10CC EACH   Final    Culture  Setup Time [id number]   Final    Culture     Final    Value:        BLOOD CULTURE RECEIVED NO GROWTH TO DATE CULTURE WILL BE HELD FOR 5 DAYS BEFORE ISSUING A FINAL NEGATIVE REPORT   Report Status PENDING   Incomplete   CULTURE, BLOOD (ROUTINE X 2)     Status: Normal (Preliminary result)   Collection Time   04/20/11  9:14 PM      Component Value Range Status Comment   Specimen Description BLOOD RIGHT HAND   Final    Special Requests BOTTLES DRAWN AEROBIC AND ANAEROBIC [facility]   Final    Culture  Setup Time [id number]   Final    Culture     Final    Value:  BLOOD CULTURE RECEIVED NO GROWTH TO DATE CULTURE WILL BE HELD FOR 5 DAYS BEFORE ISSUING A FINAL NEGATIVE REPORT   Report Status PENDING   Incomplete    Creatinine:  Basename 04/22/11 0626 04/21/11 0640 04/20/11 2105 04/20/11 1149 04/19/11 0545 04/17/11 0645 04/16/11 1840  CREATININE 0.65 0.63 0.72 0.61 0.61 0.81 0.64   Baseline Creatinine:   Impression/Assessment:  Mr. Jean Rosenthal has a very difficult situation with neurogenic bladder secondary to end-stage multiple sclerosis and bladder calculi. Given the need for chronic catheterization he will never really clear his urinary tract of bacterial colonization. Whether the stones are really making the situation worse is debatable. Treatment of the stones would be difficult and potentially risky for the patient given his current situation medically. In addition given his markedly contracted lower extremities it would be exceedingly difficult to do an endoscopic procedure on him. Certainly rigid cystoscopy would be out of the question and it would be difficult to use flexible cystoscopy with laser treatment to try to render him stone free. Given the entirety of the situation it really appears that it may be more prudent to really continue to address issues of palliative care in this patient who clearly will not benefit long-term from aggressive medical  therapy.  Plan:  Will notify Dr. Retta Diones about Mr. Markovic admission and he can ultimately decide whether he wants to pursue treatment of the bladder calculi.  Reford Olliff S 04/22/2011, 4:31 PM

## 2011-04-22 NOTE — Progress Notes (Signed)
CSW reviewed PT sign off.  CSW requested RN put order in for re-eval with recommendations.  CSW met with Pt and mother, who are hopeful for short term rehab, with return back home.  Rehab list given to Pt mother with copy left for Pt wife.  Pt mother reported Pt does not have mcd, had it for only 2 months and lost it.  CSW requested weekday SW refer to financial counselor.  CSW to f/u with rehab recommendations. Milus Banister MSW,LCSW w/e Coverage [phone]

## 2011-04-23 LAB — COMPREHENSIVE METABOLIC PANEL
ALT: 14 U/L (ref 0–53)
AST: 20 U/L (ref 0–37)
Alkaline Phosphatase: 75 U/L (ref 39–117)
CO2: 22 mEq/L (ref 19–32)
Chloride: 108 mEq/L (ref 96–112)
GFR calc non Af Amer: >90 mL/min (ref >90)
Sodium: 141 mEq/L (ref 135–145)
Total Bilirubin: 0.2 mg/dL — ABNORMAL LOW (ref 0.3–1.2)

## 2011-04-23 LAB — CBC
Platelets: 447 10*3/uL — ABNORMAL HIGH (ref 150–400)
RBC: 3.72 MIL/uL — ABNORMAL LOW (ref 4.22–5.81)
WBC: 7.8 10*3/uL (ref 4.0–10.5)

## 2011-04-23 LAB — URINE CULTURE

## 2011-04-23 LAB — GLUCOSE, CAPILLARY: Glucose-Capillary: 87 mg/dL (ref 70–99)

## 2011-04-23 MED ORDER — CIPROFLOXACIN IN D5W 400 MG/200ML IV SOLN
400.0000 mg | Freq: Two times a day (BID) | INTRAVENOUS | Status: DC
Start: 2011-04-24 — End: 2011-04-29
  Administered 2011-04-24 – 2011-04-29 (×12): 400 mg via INTRAVENOUS
  Filled 2011-04-23 (×13): qty 200

## 2011-04-23 NOTE — Progress Notes (Signed)
Received return call from [facility] discharge planner [facility] RN [phone] x [phone].  She is available to assist with discharge & long term care planning for patient.  For any additional questions or new referrals please contact Anibal Henderson BSN RN [facility] Liaison at [phone].

## 2011-04-23 NOTE — Progress Notes (Signed)
Subjective: No new issues. Feels "hungry".  Objective: Vital signs in last 24 hours: Temp:  [97.4 F (36.3 C)-98.2 F (36.8 C)] 97.8 F (36.6 C) (04/15 1346) Pulse Rate:  [94-101] 99  (04/15 1346) Resp:  [18] 18  (04/15 1346) BP: (104-116)/(68-74) 116/74 mmHg (04/15 1346) SpO2:  [94 %-98 %] 95 % (04/15 1346) Weight:  [75.297 kg (166 lb)] 75.297 kg (166 lb) (04/15 1147) Weight change:  Last BM Date: 04/22/11  Intake/Output from previous day: 04/14 0701 - 04/15 0700 In: -  Out: 2500 [Urine:2500] Total I/O In: -  Out: 1300 [Urine:1300]   Physical Exam: General: Appears comfortable, not short of breath at rest, following simple commands. HEENT: No clinical pallor, no jaundice, no conjunctival injection or discharge. Hydration status appears fair.  NECK: Supple, JVP not seen, no carotid bruits, no palpable lymphadenopathy, no palpable goiter.  CHEST: Clinically clear to auscultation, no wheezes, no crackles.  HEART: Sounds 1 and 2 heard, normal, regular, mildly tachycardic, no murmurs.  ABDOMEN: Flat, soft, non-tender, no palpable organomegaly, no palpable masses, normal bowel sounds. Peg site is unremarkable, and dressings are clean.  GENITALIA: Not examined. Patient has healing stage 2 sacral decubitus.  LOWER EXTREMITIES: No pitting edema, palpable peripheral pulses.  MUSCULOSKELETAL SYSTEM: Unremarkable.  CENTRAL NERVOUS SYSTEM: Quadriparetic.  Lab Results:  [facility] 04/23/11 0606 04/22/11 0626  WBC 7.8 7.9  HGB 10.4* 10.2*  HCT 30.6* 30.0*  PLT 447* 409*    Basename 04/23/11 0606 04/22/11 0626  NA 141 138  K 3.5 3.1*  CL 108 106  CO2 22 21  GLUCOSE 135* 102*  BUN <3* 4*  CREATININE 0.56 0.65  CALCIUM 8.5 8.5   Recent Results (from the past 240 hour(s))  CULTURE, BLOOD (ROUTINE X 2)     Status: Normal (Preliminary result)   Collection Time   04/20/11  9:05 PM      Component Value Range Status Comment   Specimen Description BLOOD RIGHT ARM   Final    Special Requests BOTTLES DRAWN AEROBIC AND ANAEROBIC 10CC EACH   Final    Culture  Setup Time [id number]   Final    Culture     Final    Value:        BLOOD CULTURE RECEIVED NO GROWTH TO DATE CULTURE WILL BE HELD FOR 5 DAYS BEFORE ISSUING A FINAL NEGATIVE REPORT   Report Status PENDING   Incomplete   CULTURE, BLOOD (ROUTINE X 2)     Status: Normal (Preliminary result)   Collection Time   04/20/11  9:14 PM      Component Value Range Status Comment   Specimen Description BLOOD RIGHT HAND   Final    Special Requests BOTTLES DRAWN AEROBIC AND ANAEROBIC 10CC EACH   Final    Culture  Setup Time [id number]   Final    Culture     Final    Value:        BLOOD CULTURE RECEIVED NO GROWTH TO DATE CULTURE WILL BE HELD FOR 5 DAYS BEFORE ISSUING A FINAL NEGATIVE REPORT   Report Status PENDING   Incomplete   URINE CULTURE     Status: Normal   Collection Time   04/21/11  6:51 AM      Component Value Range Status Comment   Specimen Description URINE, CATHETERIZED   Final    Special Requests NONE   Final    Culture  Setup Time [id number]   Final    Colony Count 50,000 COLONIES/ML  Final    Culture PSEUDOMONAS AERUGINOSA   Final    Report Status 04/23/2011 FINAL   Final    Organism ID, Bacteria PSEUDOMONAS AERUGINOSA   Final      Studies/Results: Dg Abd 2 Views  May 12, 2011  *RADIOLOGY REPORT*  Clinical Data: Evaluate pneumoperitoneum  ABDOMEN - 2 VIEW  Comparison: Supine abdominal radiograph dated May 12, 2011.  Semi- erect chest radiograph dated 04/20/2011.  Findings: Gastrostomy tube.  Overlying skin staples.  Nonobstructive bowel gas pattern.  Pneumoperitoneum on the decubitus radiograph, although not expected given the history of recent gastrostomy placement.  Given differences in technique, the amount cannot be directly compared to the prior radiographs.  Visualized osseous structures are within normal limits.  IMPRESSION: Pneumoperitoneum on the decubitus radiograph, not unexpected given the  history of recent gastrostomy placement.  Follow-up radiographs (performed with a consistent technique) could be performed to confirm that the amount of free air is decreasing.  If the patient develops symptoms of abdominal pain, CT abdomen/pelvis should be considered.  These findings were discussed in person with the referring physician at the time of dictation.  Original Report Authenticated By: Charline Bills, M.D.    Medications: Scheduled Meds:    . baclofen  10 mg Per Tube TID  . enoxaparin  40 mg Subcutaneous Q24H  . free water  150 mL Per Tube 6 X Daily  . imipenem-cilastatin  250 mg Intravenous Q6H  . interferon beta-1b  0.3 mg Subcutaneous QODAY  . metoCLOPramide  10 mg Per Tube Q6H  . potassium chloride  40 mEq Per Tube Daily  . vancomycin  750 mg Intravenous Q12H   Continuous Infusions:    . sodium chloride 75 mL/hr at 04/23/11 0458  . feeding supplement (OSMOLITE 1.2 CAL) 1,000 mL (04/23/11 1552)   PRN Meds:.acetaminophen (TYLENOL) oral liquid 160 mg/5 mL, HYDROcodone-acetaminophen, HYDROmorphone, ondansetron (ZOFRAN) IV, ondansetron  Assessment/Plan: Principal Problem:  *UTI (lower urinary tract infection): Patient has a positive urinary sediment, with marked pyuria, consistent with a UTI. He has just completed a 7-day course of Diflucan for funguria, during his recent hospitalization. ED PA gave a single dose of iv Rocephin in the ED, but given his history of recurrences, chronic Foley catheter and prophylactic Macrobid, this is a complicated UTI, and a resistant organism may be responsible. He was placed on Vancomycin/Primaxin now day# 4, with satisfactory clinical response. Wcc has normalized, he is no longer tachycardic, and feels considerably better.Urine culture grew Pseudomonas Aeruginosa, most sensitive to Imipenem and Ciprofloxacin. We shall switch patient to Ciprofloxacin monotherapy from 04/24/11. Vancomycin has been discontinued. Total planned course of  antibiotics, will be 10 days. Active Problems:  1. Query Sepsis: Patient appeared to have SIRS, and possibly even early sepsis on presentation. He had a leukocytosis, tachycardia and borderline low BP and was reportedly, feverish at home. CXR showed no acute disease. Lactic acid is 1.3 and procalcitonin is <0.10, effectively ruling out sepsis.  2. Multiple sclerosis: This is advanced MS, and patient is under the care of Dr Lesia Sago, neurologist, and is on Betaseron, which we have continued.  3. FTT (failure to thrive) in adult/Protein calorie malnutrition: This is occasioned by chronic dysphagia, due to multiple sclerosis, and is being addressed with artificial nutrition, via PEG tube.  4. Sacral decubitus ulcer, stage II: Patient does have a healing stage 2 sacral decubitus, which appears well granulated and un-infected. WOC consult recommendations have been obtained, and he has appropriate mattress overlay.  5. Pneumoperitoneum. This was an incidental  finding on CXR, and was not seen on single view AXR. It is likely to be a legacy of patient's surgical PEG placement on 04/16/11. Have discussed with radiologist, and an acute abdominal series has been recommended, to evaluate for progression. Patient has no abdominal tenderness, and bowel sounds are heard. I have discussed with Dr Violeta Gelinas, and he has assured me that following such a procedure, a pneumoperitoneum is anticipated for 7-10 days. We anticipate a repeat radiologic study on 04/24/11. 6. Diarrhea: Patent has had several bowel movements today, and while this may be a consequence of his artificial nutrition, there is concern for C.Difficle colitis, given recent and on-going antibiotic therapy. Will discontinue laxatives, and check C.Difficile PCR. 7. Bladder Calculi: Patient was found to have multiple bladder calculi on Pelvic CT scan of 03/12/11. This, in addition to neurogenic bladder, is obviously yet another predisposition to UTIs. Urology  consultation was provided by Dr Isabel Caprice, and he has opined that specific/invasive management of patient's calculi, will prove technically very difficult. He feels that given the entirety of patient's particular situation, it may be more prudent to continue to pursue issues of palliative care. 8. Dysphagia. Patient has dysphagia, due to MS, and per family, was able to tolerate soft mechanical diet, pre-admission. They are desirous of re-starting this, now that he is better. We shall consult speech therapist, to make recommendations.  Comment: Of note the family has refused hospice services and palliative care consult in the past. They reversed his DNR during recent admission and continue to wish patient to be FULL CODE.  Per my discussion with Remi Haggard PA, family now want patient placed.       LOS: 3 days   Kazuo Durnil,CHRISTOPHER 04/23/2011, 4:20 PM

## 2011-04-23 NOTE — Evaluation (Signed)
Occupational Therapy Evaluation Patient Details Name: Terry Palmer MRN: [id number] DOB: 1975/05/20 Today's Date: 04/23/2011  Problem List:  Patient Active Problem List  Diagnoses  . Multiple sclerosis exacerbation  . Asthma  . Weakness generalized  . Encephalopathy  . Hematuria  . Quadriparesis (muscle weakness)  . Enterococcus UTI  . Malnutrition  . Feeding difficulties  . UTI (lower urinary tract infection)  . Sepsis  . Multiple sclerosis  . FTT (failure to thrive) in adult  . Protein calorie malnutrition  . Sacral decubitus ulcer, stage II    Past Medical History:  Past Medical History  Diagnosis Date  . MS (multiple sclerosis)   . UTI (urinary tract infection)   . 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 Childhood asthma   . Depression   . Neuromuscular disorder     Quadraperesis   Past Surgical History:  Past Surgical History  Procedure Date  . Lumbar puncture 10/12/2002  . Gastrostomy 04/16/2011    Procedure: GASTROSTOMY;  Surgeon: Liz Malady, MD;  Location: [facility] OR;  Service: General;  Laterality: N/A;  Open G-Tube placement    OT Assessment/Plan/Recommendation OT Assessment Clinical Impression Statement: Pt admitted with UTI in the setting of end stage MS.  Pt is dependent in all mobility and ADL.  Joint integrity is fairly well maintained through ROM and positioning.  No splinting needs.  No further OT needs acutely.  Family is seeking SNF placement. OT Recommendation/Assessment: Patient does not need any further OT services OT Recommendation Follow Up Recommendations: Skilled nursing facility OT Evaluation Precautions/Restrictions  Precautions Precautions: Fall Restrictions Weight Bearing Restrictions: No Prior Functioning Home Living Lives With: Family Available Help at Discharge: Family Home Adaptive Equipment: Hospital bed Additional Comments: No family available. Prior Function Level of Independence: Needs assistance Needs  Assistance: Bathing;Dressing;Grooming;Transfers Bath: Total Dressing: Total Grooming: Total  ADL ADL Eating/Feeding: Other (comment);NPO (peg tube) Grooming: Performed;+1 Total assistance;Wash/dry face Where Assessed - Grooming: Supine, head of bed up ADL Comments: Pt requires total care for all ADL and bed mobility.  Worked with pt on accessing his call button with his head or either hand.   Cognition Cognition Arousal/Alertness: Awake/alert Overall Cognitive Status: Difficult to assess Difficult to assess due to: other (comment) (non verbal) Orientation Level: Oriented to person Extremity Assessment RUE Assessment RUE Assessment: Exceptions to [facility] (full PROM, no functional use.) LUE Assessment LUE Assessment: Exceptions to [facility] (contracted elbow at 90, all other PROM [facility]) Mobility  Bed Mobility Bed Mobility: Yes Rolling Right: 1: +1 Total assist Rolling Right Details (indicate cue type and reason): Pt with significant muscle spasms in LEs contributing to flexed posturing in bed. Responded favorable to abduction of LEs and slow stretch. Rolling Left: 1: +1 Total assist Scooting to HOB: 1: +1 Total assist Transfers Transfers: No End of Session OT - End of Session Activity Tolerance: Patient limited by fatigue;Other (comment) (closed his eyes once positioned optimally) Patient left: in bed;with call bell in reach Nurse Communication: Other (comment) (placement of call pad for pt's access) General Behavior During Session: [facility] for tasks performed   Evern Bio 04/23/2011, 12:34 PM  [phone]

## 2011-04-23 NOTE — Progress Notes (Signed)
Clinical Social Worker spoke with patient and patient wife at bedside to discuss patient plans at discharge.  According to CSW assessment on 4/12, patient family had expressed interest in short term SNF placement.  Patient and patient wife continues to be agreeable with this plan, however understands that patient insurance may not approve patient for short term stay and is prepared to have patient return home with [facility].    Patient wife agreeable to SNF search in [facility] - CSW completed FL2 and initiated SNF search for possible bed offers.  Once bed has been chosen, will attempt for insurance authorization with [facility].  Patient wife understands that patient may be eligible for placement at [facility] without prior authorization - if this is the case patient wife is agreeable with placement at [facility].    Clinical Social Worker will continue to follow up with patient and patient wife to discuss bed offers and insurance authorization.  [address] [place], [place] [phone]

## 2011-04-23 NOTE — Progress Notes (Addendum)
Clinical Social Work Department CLINICAL SOCIAL WORK PLACEMENT NOTE 04/23/2011  Patient:  Terry Palmer, Terry Palmer  Account Number:  [id number] Admit date:  04/20/2011  Clinical Social Worker:  Macario Golds, Theresia Majors  Date/time:  04/23/2011 10:10 AM  Clinical Social Work is seeking post-discharge placement for this patient at the following level of care:   SKILLED NURSING   (*CSW will update this form in Epic as items are completed)   04/23/2011  Patient/family provided with [facility] Department of Clinical Social Work's list of facilities offering this level of care within the geographic area requested by the patient (or if unable, by the patient's family).  04/23/2011  Patient/family informed of their freedom to choose among providers that offer the needed level of care, that participate in Medicare, Medicaid or managed care program needed by the patient, have an available bed and are willing to accept the patient.  04/23/2011  Patient/family informed of [facility]' ownership interest in [facility], as well as of the fact that they are under no obligation to receive care at this facility.  PASARR submitted to EDS on 10/29/2010 PASARR number received from EDS on 10/30/2010  FL2 transmitted to all facilities in geographic area requested by pt/family on  04/23/2011 FL2 transmitted to all facilities within larger geographic area on   Patient informed that his/her managed care company has contracts with or will negotiate with  certain facilities, including the following:   Family understands that [facility] may not authorize short term SNF option at discharge     Patient/family informed of bed offers received:  04/27/2011 Patient chooses bed at [facility] Physician recommends and patient chooses bed at    Patient to be transferred to  on  05/03/11 Patient to be transferred to facility by [facility]  The following physician request were entered in Epic:   Additional  Comments:  Macario Golds, LCSWA [phone]

## 2011-04-23 NOTE — Consult Note (Signed)
WOC consult Note Reason for Consult: eval wound of sacrum.  Family at bedside reports they have been caring for this at home, cleaning and applying dressing. Has HHRN for support with this as well, she reports he has hospital bed but does not have air mattress overlay, would suggest this if pt is to be dced back to home. Wound type: Stage III pressure ulcer, full thickness skin loss Pressure Ulcer POA: Yes Measurement: 2.5cm x 2.0cm x 0.2cm  Wound JXB:JYNW and beefy red with 100% granulation tissue present Drainage (amount, consistency, odor) minimal on dressing, serous, no odor Periwound:intact without problems Dressing procedure/placement/frequency: silicone foam dressing to this wound, changed every 3 days and PRN soilage. Discussed with family at bedside and with nursing.  Air mattress in place currently for pressure redistribution.  Re consult if needed, will not follow at this time. Thanks  Shontez Sermon [employer], CWOCN [phone])

## 2011-04-23 NOTE — Consult Note (Signed)
ANTIBIOTIC CONSULT NOTE - INITIAL  Pharmacy Consult for Vancomycin, Imipenem Indication: UTI  No Known Allergies  Patient Measurements:  Height: 6\' 4"  (193 cm)  Weight: 148 lb 5.9 oz (67.3 kg)  IBW/kg (Calculated) : 86.8   Vital Signs: Temp: 98.2 F (36.8 C) (04/15 0631) Temp src: Oral (04/15 0631) BP: 107/70 mmHg (04/15 0631) Pulse Rate: 94  (04/15 0631) Intake/Output from previous day: 04/14 0701 - 04/15 0700 In: -  Out: 2500 [Urine:2500] Intake/Output from this shift:    Labs:  Basename 04/23/11 0606 04/22/11 0626 04/21/11 0640  WBC 7.8 7.9 6.8  HGB 10.4* 10.2* 9.9*  PLT 447* 409* 431*  LABCREA -- -- --  CREATININE 0.56 0.65 0.63   The CrCl is unknown because both a height and weight (above a minimum accepted value) are required for this calculation. No results found for this basename: VANCOTROUGH:2,VANCOPEAK:2,VANCORANDOM:2,GENTTROUGH:2,GENTPEAK:2,GENTRANDOM:2,TOBRATROUGH:2,TOBRAPEAK:2,TOBRARND:2,AMIKACINPEAK:2,AMIKACINTROU:2,AMIKACIN:2, in the last 72 hours   Microbiology: Recent Results (from the past 720 hour(s))  URINE CULTURE     Status: Normal   Collection Time   04/10/11  5:23 PM      Component Value Range Status Comment   Specimen Description URINE, CATHETERIZED   Final    Special Requests NONE   Final    Culture  Setup Time [id number]   Final    Colony Count >=100,000 COLONIES/ML   Final    Culture YEAST   Final    Report Status 04/11/2011 FINAL   Final   CULTURE, BLOOD (ROUTINE X 2)     Status: Normal (Preliminary result)   Collection Time   04/20/11  9:05 PM      Component Value Range Status Comment   Specimen Description BLOOD RIGHT ARM   Final    Special Requests BOTTLES DRAWN AEROBIC AND ANAEROBIC 10CC EACH   Final    Culture  Setup Time [id number]   Final    Culture     Final    Value:        BLOOD CULTURE RECEIVED NO GROWTH TO DATE CULTURE WILL BE HELD FOR 5 DAYS BEFORE ISSUING A FINAL NEGATIVE REPORT   Report Status PENDING   Incomplete    CULTURE, BLOOD (ROUTINE X 2)     Status: Normal (Preliminary result)   Collection Time   04/20/11  9:14 PM      Component Value Range Status Comment   Specimen Description BLOOD RIGHT HAND   Final    Special Requests BOTTLES DRAWN AEROBIC AND ANAEROBIC 10CC EACH   Final    Culture  Setup Time [id number]   Final    Culture     Final    Value:        BLOOD CULTURE RECEIVED NO GROWTH TO DATE CULTURE WILL BE HELD FOR 5 DAYS BEFORE ISSUING A FINAL NEGATIVE REPORT   Report Status PENDING   Incomplete   URINE CULTURE     Status: Normal   Collection Time   04/21/11  6:51 AM      Component Value Range Status Comment   Specimen Description URINE, CATHETERIZED   Final    Special Requests NONE   Final    Culture  Setup Time [id number]   Final    Colony Count 50,000 COLONIES/ML   Final    Culture PSEUDOMONAS AERUGINOSA   Final    Report Status 04/23/2011 FINAL   Final    Organism ID, Bacteria PSEUDOMONAS AERUGINOSA   Final     Medical History: Past Medical History  Diagnosis Date  . MS (multiple sclerosis)   . UTI (urinary tract infection)   . 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 Childhood asthma   . Depression   . Neuromuscular disorder     Quadraperesis    Medications:  Prescriptions prior to admission  Medication Sig Dispense Refill  . baclofen (LIORESAL) 20 MG tablet Take 10 mg by mouth 3 (three) times daily.      . cholecalciferol (VITAMIN D) 1000 UNITS tablet Take 1,000 Units by mouth 2 (two) times daily.       . Garlic 1000 MG CAPS Take 1,000 mg by mouth daily.        Marland Kitchen HYDROcodone-acetaminophen (LORTAB) 7.5-500 MG/15ML solution Place 10 mLs into feeding tube every 6 (six) hours as needed. For pain      . ibuprofen (ADVIL,MOTRIN) 200 MG tablet Take 200 mg by mouth every 6 (six) hours as needed. For pain      . interferon beta-1b (BETASERON) 0.3 MG injection Inject 0.3 mg into the skin every other day. Next dose due 04/20/2011      . nitrofurantoin (MACRODANTIN) 100 MG  capsule Take 100 mg by mouth 2 (two) times daily.      Marland Kitchen omega-3 acid ethyl esters (LOVAZA) 1 G capsule Take 1 g by mouth daily.      Marland Kitchen senna (SENOKOT) 8.6 MG TABS Take 2 tablets by mouth daily.      . Nutritional Supplements (FEEDING SUPPLEMENT, OSMOLITE 1.5 CAL,) LIQD Place 1,000 mLs into feeding tube continuous.       Admit Complaint: 35 y.o.  male  admitted 04/20/2011 with MS, quadraparesis, neurogenic bladder with chronic foley admitted with fever and tachycardia.  Pharmacy consulted to start vancomycin and imipenem empirically for UTI.  Assessment: Infectious Disease: UTI, sepsis, currently afebrile, WBC 7.8, urine culture Pseudomonas aeruginosa sens to primaxin.  Goal of Therapy:  Vancomycin trough level 15-20 mcg/ml   Plan:  1. Continue imipenem 250 mg IV q6h 2.  Check vanc trough tonight.   Talbert Cage Poteet 04/23/2011,12:31 PM

## 2011-04-24 ENCOUNTER — Inpatient Hospital Stay (HOSPITAL_COMMUNITY): Payer: Medicare HMO

## 2011-04-24 LAB — BASIC METABOLIC PANEL
BUN: 4 mg/dL — ABNORMAL LOW (ref 6–23)
GFR calc non Af Amer: >90 mL/min (ref >90)
Glucose, Bld: 92 mg/dL (ref 70–99)
Potassium: 3.8 mEq/L (ref 3.5–5.1)

## 2011-04-24 LAB — CBC
HCT: 32.1 % — ABNORMAL LOW (ref 39.0–52.0)
Hemoglobin: 10.8 g/dL — ABNORMAL LOW (ref 13.0–17.0)
MCH: 28.2 pg (ref 26.0–34.0)
MCHC: 33.6 g/dL (ref 30.0–36.0)

## 2011-04-24 LAB — GLUCOSE, CAPILLARY
Glucose-Capillary: 93 mg/dL (ref 70–99)
Glucose-Capillary: 94 mg/dL (ref 70–99)
Glucose-Capillary: 96 mg/dL (ref 70–99)

## 2011-04-24 NOTE — Progress Notes (Signed)
Clinical Social Worker spoke with patient mother and patient wife while patient down in Radiology.  Patient mother and patient wife expressed concerns about patient returning home and are still hopeful for short term SNF placement.  CSW explained that due to patient age and current insurance provider there would be a difficulty of placement.  Patient family understanding but remain hopeful for short term placement prior to home.  Patient family agreeable to discuss patient eligibility for palliative care services but do not want to discuss hospice option - CSW will notify MD.  Clinical Social Worker will continue to follow up with patient and patient family to discuss placement options versus home option pending insurance authorization.    [address] [place], [place] [phone]

## 2011-04-24 NOTE — Progress Notes (Signed)
Speech-language Pathology   Order received for swallowing eval.  Pt well-known to SLP services from prior admissions.  Family questioning if pt may begin POs for pleasure.  Discussed with mother and wife hx of Sarah's dysphagia, basic precautions, and the moderately high risk of aspiration of POs as well as PEG feedings (explaining that PEGs do not eliminate aspiration risk in progressive neurological disease).  They would like to resume some POs in the mornings when pt is stronger, well-rested, and more alert, but rely primarily on enteral feeding for nutritional support. Provided support.    Formalized SLP swallow eval not undertaken - there is, unfortunately, little more we can offer in way of rehabilitating swallow, but pt's wife and mother would like to provide the pt with POs per his preferences and it appears quite appropriate to do so.  REC:  Wife/mother to feed pt per his preferences; primary nutrition via PEG. No further SLP intervention warranted.

## 2011-04-24 NOTE — Discharge Summary (Signed)
Physician Discharge Summary  Patient ID: Terry Palmer MRN: [id number] DOB/AGE: Oct 09, 1975 36 y.o.  Admit date: 04/20/2011 Discharge date: 04/24/2011  Primary Care Physician:  Dorrene German, MD, MD   Discharge Diagnoses:    Patient Active Problem List  Diagnoses  . Multiple sclerosis exacerbation  . Asthma  . Weakness generalized  . Encephalopathy  . Hematuria  . Quadriparesis (muscle weakness)  . Enterococcus UTI  . Malnutrition  . Feeding difficulties  . UTI (lower urinary tract infection)  . Sepsis  . Multiple sclerosis  . FTT (failure to thrive) in adult  . Protein calorie malnutrition  . Sacral decubitus ulcer, stage II    Medication List  As of 04/24/2011  5:00 PM   ASK your doctor about these medications         baclofen 20 MG tablet   Commonly known as: LIORESAL   Take 10 mg by mouth 3 (three) times daily.      BETASERON 0.3 MG injection   Generic drug: interferon beta-1b   Inject 0.3 mg into the skin every other day. Next dose due 04/20/2011      cholecalciferol 1000 UNITS tablet   Commonly known as: VITAMIN D   Take 1,000 Units by mouth 2 (two) times daily.      feeding supplement (OSMOLITE 1.5 CAL) Liqd   Place 1,000 mLs into feeding tube continuous.      free water Soln   Place 150 mLs into feeding tube 6 (six) times daily.      Garlic 1000 MG Caps   Take 1,000 mg by mouth daily.      HYDROcodone-acetaminophen 7.5-500 MG/15ML solution   Commonly known as: LORTAB   Place 10 mLs into feeding tube every 6 (six) hours as needed. For pain      ibuprofen 200 MG tablet   Commonly known as: ADVIL,MOTRIN   Take 200 mg by mouth every 6 (six) hours as needed. For pain      nitrofurantoin 100 MG capsule   Commonly known as: MACRODANTIN   Take 100 mg by mouth 2 (two) times daily.      omega-3 acid ethyl esters 1 G capsule   Commonly known as: LOVAZA   Take 1 g by mouth daily.      senna 8.6 MG Tabs   Commonly known as: SENOKOT   Take 2 tablets  by mouth daily.             Disposition and Follow-up:  Follow up with SNF MD.  Consults:  urology  Dr Isabel Caprice, Urologist.  Significant Diagnostic Studies:  Dg Chest Port 1 View  04/20/2011  *RADIOLOGY REPORT*  Clinical Data: Fever.  Percutaneous gastrostomy tube placement several days ago.  PORTABLE CHEST - 1 VIEW  Comparison: 04/10/2011  Findings: A moderate amount of free intraperitoneal air is seen in the right upper quadrant. This is likely due to recent percutaneous gastrostomy tube placement.  No evidence of pneumothorax or pleural effusion.  Low lung volumes are seen however both lungs are clear.  Heart size is normal.  IMPRESSION:  1.  No active cardiopulmonary disease. 2.  Pneumoperitoneum, likely due to given history of recent percutaneous gastrostomy tube placement.  Critical Value/emergent results were called by telephone at the time of interpretation on 04/20/2011  at 2145 hours  to  the patient's floor nurse [name] on 3100, who verbally acknowledged these results. .  Original Report Authenticated By: Danae Orleans, M.D.   Dg Abd Portable 1v  04/21/2011  *RADIOLOGY REPORT*  Clinical Data: Recent gastrostomy tube placement  PORTABLE ABDOMEN - 1 VIEW  Comparison: Chest film 04/12 1013  Findings: There are skin staples over the midabdomen.  Percutaneous gastrostomy tube is in the expected location of the stomach. Intraperitoneal free air is difficult to evaluate on this supine exam.  IMPRESSION: No evidence of  complication following percutaneous gastrostomy tube placement.  Recommend follow-up acute abdominal series to further assess free air seen on chest radiograph.  Original Report Authenticated By: Genevive Bi, M.D.    Brief H and P: For complete details, refer to admission H and P. However, in brief, this is a 36 year old male, with known history of multiple sclerosis, quadriparesis and neurogenic bladder, on chronic Foley catheter, bedbound, stage 2 sacral decubitus,  recurrent UTIs on prophylactic Macrobid, hospitalized from 04/10/11- 04/19/11, with FFT, dehydration, fungal UTI and altered mental status. Patient has chronic dysphagia, and aspiration. In the recent past, he had been evaluated by IR and GI for peg tube placement and given his complex anatomy, was recommended surgery for PEG. Gastrostomy tube was placed surgically on 04/16/11, by Dr Violeta Gelinas. Patient was discharged in satisfactory condition on 04/19/11, but on arrival at home became quite sweaty and ran a temperature on 04/19/11. On 04/20/11, he become worse, and developed a rapid heart rate. He was therefore, brought to the ED and was re-admitted, for further evaluation, investigation and management.  Physical Exam: On 04/24/11. General: Appears comfortable, not short of breath at rest, following simple commands.  HEENT: No clinical pallor, no jaundice, no conjunctival injection or discharge. Hydration status is satisfactory.  NECK: Supple, JVP not seen, no carotid bruits, no palpable lymphadenopathy, no palpable goiter.  CHEST: Clinically clear to auscultation, no wheezes, no crackles.  HEART: Sounds 1 and 2 heard, normal, regular, mildly tachycardic, no murmurs.  ABDOMEN: Flat, soft, non-tender, no palpable organomegaly, no palpable masses, normal bowel sounds. Peg site is unremarkable, and dressings are clean.  GENITALIA: Not examined. Patient has healing stage 2 sacral decubitus.  LOWER EXTREMITIES: No pitting edema, palpable peripheral pulses.  MUSCULOSKELETAL SYSTEM: Unremarkable.  CENTRAL NERVOUS SYSTEM: Quadriparetic.  Hospital Course:  Principal Problem:  *UTI (lower urinary tract infection): Patient at presentation, was found to have a positive urinary sediment, with marked pyuria, consistent with a UTI. He has just completed a 7-day course of Diflucan for funguria, during his recent hospitalization. ED PA gave a single dose of iv Rocephin in the ED, but given his history of recurrences,  chronic Foley catheter and prophylactic Macrobid, this was deemed a complicated UTI, and as a resistant organism may be responsible, he was placed on Vancomycin/Primaxin, with satisfactory clinical response. Wcc normalized, tachycardia resolved, and patient felt considerably better. Urine culture grew Pseudomonas Aeruginosa, most sensitive to Imipenem and Ciprofloxacin. We switched patient to Ciprofloxacin monotherapy from 04/24/11. Total planned course of antibiotics, will be 10 days.  Active Problems:  1. Query Sepsis: Patient appeared to have SIRS, and possibly even early sepsis on presentation. He had a leukocytosis, tachycardia and borderline low BP and was reportedly, feverish at home. CXR showed no acute disease. Lactic acid was 1.3 and procalcitonin was <0.10, effectively ruling out sepsis.  2. Multiple sclerosis: This is advanced MS, patient is under the care of Dr Lesia Sago, neurologist, and is on Betaseron, which we have continued.  3. FTT (failure to thrive) in adult/Protein calorie malnutrition: This is occasioned by chronic dysphagia, due to multiple sclerosis, and is being addressed with  artificial nutrition, via PEG tube.  4. Sacral decubitus ulcer, stage II: Patient does have a healing stage 2 sacral decubitus, which appears well granulated and un-infected. WOC consult recommendations were obtained during this hospitalization and he was managed with appropriate mattress overlay.  5. Pneumoperitoneum. This was an incidental finding on CXR, and was not seen on single view AXR. It is likely to be a legacy of patient's surgical PEG placement on 04/16/11. Have discussed with radiologist, and an acute abdominal series was been recommended, to evaluate for progression. Patient had no abdominal tenderness, and bowel sounds were heard. I have discussed with Dr Violeta Gelinas, surgeon, and he has assured me that following such a procedure, a pneumoperitoneum is anticipated for 7-10 days. Repeat  abdominal X-Ray of  04/24/11, showed pneumoperitoneum which has mildly decreased in size. Normal bowel gas pattern. Patient has been reassured accordingly.  6. Diarrhea: Patent had several bowel movements on 04/23/11, and while this may be a consequence of his artificial nutrition, there was concern for C.Difficle colitis, given recent and on-going antibiotic therapy. Have discontinued laxatives, without recurrence. C.Diff PCR is pending.  7. Bladder Calculi: Patient was found to have multiple bladder calculi on Pelvic CT scan of 03/12/11. This, in addition to neurogenic bladder, is obviously yet another predisposition to UTIs. Urology consultation was provided by Dr Isabel Caprice, and he has opined that specific/invasive management of patient's calculi, will prove technically very difficult. He feels that given the entirety of patient's particular situation, it may be more prudent to continue to pursue issues of palliative care.  8. Dysphagia. Patient has dysphagia, due to MS, and per family, was able to tolerate soft mechanical diet, pre-admission. They are desirous of re-starting this, now that he is better. We consulted speech therapist, to make recommendations. Per speech therapist, formalized SLP swallow evauationl not undertaken, as there is unfortunately, little that can be offered in way of rehabilitating swallow, but patient's wife and mother would like to provide the patient with POs per his preferences and it appears quite appropriate to do so.  Comment:  Clinically stable for discharge to SNF on 04/25/11.   Time spent on Discharge: 45 mins.  Signed: Aubrianne Molyneux,CHRISTOPHER 04/24/2011, 5:00 PM

## 2011-04-24 NOTE — Progress Notes (Signed)
Subjective: No new issues.   Objective: Vital signs in last 24 hours: Temp:  [97.7 F (36.5 C)-97.8 F (36.6 C)] 97.7 F (36.5 C) (04/16 0550) Pulse Rate:  [92-99] 94  (04/16 0550) Resp:  [18] 18  (04/16 0550) BP: (106-121)/(70-80) 106/70 mmHg (04/16 0550) SpO2:  [95 %-98 %] 96 % (04/16 0550) Weight change:  Last BM Date: 04/23/11  Intake/Output from previous day: 04/15 0701 - 04/16 0700 In: -  Out: 2900 [Urine:2900]     Physical Exam: General: Appears comfortable, not short of breath at rest, following simple commands. HEENT: No clinical pallor, no jaundice, no conjunctival injection or discharge. Hydration status is satisfactory. NECK: Supple, JVP not seen, no carotid bruits, no palpable lymphadenopathy, no palpable goiter.  CHEST: Clinically clear to auscultation, no wheezes, no crackles.  HEART: Sounds 1 and 2 heard, normal, regular, mildly tachycardic, no murmurs.  ABDOMEN: Flat, soft, non-tender, no palpable organomegaly, no palpable masses, normal bowel sounds. Peg site is unremarkable, and dressings are clean.  GENITALIA: Not examined. Patient has healing stage 2 sacral decubitus.  LOWER EXTREMITIES: No pitting edema, palpable peripheral pulses.  MUSCULOSKELETAL SYSTEM: Unremarkable.  CENTRAL NERVOUS SYSTEM: Quadriparetic.  Lab Results:  [facility] 04/24/11 0555 04/23/11 0606  WBC 7.1 7.8  HGB 10.8* 10.4*  HCT 32.1* 30.6*  PLT 482* 447*    Basename 04/24/11 0555 04/23/11 0606  NA 140 141  K 3.8 3.5  CL 107 108  CO2 23 22  GLUCOSE 92 135*  BUN 4* <3*  CREATININE 0.58 0.56  CALCIUM 8.9 8.5   Recent Results (from the past 240 hour(s))  CULTURE, BLOOD (ROUTINE X 2)     Status: Normal (Preliminary result)   Collection Time   04/20/11  9:05 PM      Component Value Range Status Comment   Specimen Description BLOOD RIGHT ARM   Final    Special Requests BOTTLES DRAWN AEROBIC AND ANAEROBIC 10CC EACH   Final    Culture  Setup Time [id number]   Final    Culture     Final    Value:        BLOOD CULTURE RECEIVED NO GROWTH TO DATE CULTURE WILL BE HELD FOR 5 DAYS BEFORE ISSUING A FINAL NEGATIVE REPORT   Report Status PENDING   Incomplete   CULTURE, BLOOD (ROUTINE X 2)     Status: Normal (Preliminary result)   Collection Time   04/20/11  9:14 PM      Component Value Range Status Comment   Specimen Description BLOOD RIGHT HAND   Final    Special Requests BOTTLES DRAWN AEROBIC AND ANAEROBIC 10CC EACH   Final    Culture  Setup Time [id number]   Final    Culture     Final    Value:        BLOOD CULTURE RECEIVED NO GROWTH TO DATE CULTURE WILL BE HELD FOR 5 DAYS BEFORE ISSUING A FINAL NEGATIVE REPORT   Report Status PENDING   Incomplete   URINE CULTURE     Status: Normal   Collection Time   04/21/11  6:51 AM      Component Value Range Status Comment   Specimen Description URINE, CATHETERIZED   Final    Special Requests NONE   Final    Culture  Setup Time [id number]   Final    Colony Count 50,000 COLONIES/ML   Final    Culture PSEUDOMONAS AERUGINOSA   Final    Report Status 04/23/2011 FINAL  Final    Organism ID, Bacteria PSEUDOMONAS AERUGINOSA   Final      Studies/Results: No results found.  Medications: Scheduled Meds:    . baclofen  10 mg Per Tube TID  . ciprofloxacin  400 mg Intravenous Q12H  . enoxaparin  40 mg Subcutaneous Q24H  . free water  150 mL Per Tube 6 X Daily  . imipenem-cilastatin  250 mg Intravenous Q6H  . interferon beta-1b  0.3 mg Subcutaneous QODAY  . metoCLOPramide  10 mg Per Tube Q6H  . potassium chloride  40 mEq Per Tube Daily  . DISCONTD: vancomycin  750 mg Intravenous Q12H   Continuous Infusions:    . sodium chloride 75 mL/hr at 04/23/11 0458  . feeding supplement (OSMOLITE 1.2 CAL) 1,000 mL (04/24/11 0742)   PRN Meds:.acetaminophen (TYLENOL) oral liquid 160 mg/5 mL, HYDROcodone-acetaminophen, HYDROmorphone, ondansetron (ZOFRAN) IV, ondansetron  Assessment/Plan: Principal Problem:  *UTI (lower  urinary tract infection): Patient has a positive urinary sediment, with marked pyuria, consistent with a UTI. He has just completed a 7-day course of Diflucan for funguria, during his recent hospitalization. ED PA gave a single dose of iv Rocephin in the ED, but given his history of recurrences, chronic Foley catheter and prophylactic Macrobid, this is a complicated UTI, and a resistant organism may be responsible. He was placed on Vancomycin/Primaxin now day# 4, with satisfactory clinical response. Wcc has normalized, he is no longer tachycardic, and feels considerably better.Urine culture grew Pseudomonas Aeruginosa, most sensitive to Imipenem and Ciprofloxacin. We have switched patient to Ciprofloxacin monotherapy from 04/24/11. Vancomycin has been discontinued. Total planned course of antibiotics, will be 10 days. Active Problems:  1. Query Sepsis: Patient appeared to have SIRS, and possibly even early sepsis on presentation. He had a leukocytosis, tachycardia and borderline low BP and was reportedly, feverish at home. CXR showed no acute disease. Lactic acid is 1.3 and procalcitonin is <0.10, effectively ruling out sepsis.  2. Multiple sclerosis: This is advanced MS, and patient is under the care of Dr Lesia Sago, neurologist, and is on Betaseron, which we have continued.  3. FTT (failure to thrive) in adult/Protein calorie malnutrition: This is occasioned by chronic dysphagia, due to multiple sclerosis, and is being addressed with artificial nutrition, via PEG tube.  4. Sacral decubitus ulcer, stage II: Patient does have a healing stage 2 sacral decubitus, which appears well granulated and un-infected. WOC consult recommendations have been obtained, and he has appropriate mattress overlay.  5. Pneumoperitoneum. This was an incidental finding on CXR, and was not seen on single view AXR. It is likely to be a legacy of patient's surgical PEG placement on 04/16/11. Have discussed with radiologist, and an  acute abdominal series has been recommended, to evaluate for progression. Patient has no abdominal tenderness, and bowel sounds are heard. I have discussed with Dr Violeta Gelinas, and he has assured me that following such a procedure, a pneumoperitoneum is anticipated for 7-10 days. We anticipate a repeat radiologic study on 04/24/11. 6. Diarrhea: Patent has had several bowel movements today, and while this may be a consequence of his artificial nutrition, there is concern for C.Difficle colitis, given recent and on-going antibiotic therapy. Have discontinued laxatives, without recurrence. C.Diff PCR is pending. 7. Bladder Calculi: Patient was found to have multiple bladder calculi on Pelvic CT scan of 03/12/11. This, in addition to neurogenic bladder, is obviously yet another predisposition to UTIs. Urology consultation was provided by Dr Isabel Caprice, and he has opined that specific/invasive management of  patient's calculi, will prove technically very difficult. He feels that given the entirety of patient's particular situation, it may be more prudent to continue to pursue issues of palliative care. 8. Dysphagia. Patient has dysphagia, due to MS, and per family, was able to tolerate soft mechanical diet, pre-admission. They are desirous of re-starting this, now that he is better. We shall consult speech therapist, to make recommendations.  Comment: Of note the family has refused hospice services and palliative care consult in the past. They reversed his DNR during recent admission and continue to wish patient to be FULL CODE. Per my discussion with Remi Haggard PA, family now want patient placed.   Disposition: Anticipate discharge on 04/25/11 or 04/26/11.    LOS: 4 days   Jadis Mika,CHRISTOPHER 04/24/2011, 12:47 PM

## 2011-04-25 ENCOUNTER — Encounter (HOSPITAL_COMMUNITY): Payer: Self-pay | Admitting: Internal Medicine

## 2011-04-25 LAB — BASIC METABOLIC PANEL
CO2: 23 mEq/L (ref 19–32)
Chloride: 105 mEq/L (ref 96–112)
Glucose, Bld: 112 mg/dL — ABNORMAL HIGH (ref 70–99)
Sodium: 140 mEq/L (ref 135–145)

## 2011-04-25 LAB — GLUCOSE, CAPILLARY
Glucose-Capillary: 113 mg/dL — ABNORMAL HIGH (ref 70–99)
Glucose-Capillary: 97 mg/dL (ref 70–99)

## 2011-04-25 LAB — CBC
HCT: 32.9 % — ABNORMAL LOW (ref 39.0–52.0)
Hemoglobin: 11.2 g/dL — ABNORMAL LOW (ref 13.0–17.0)
MCHC: 34 g/dL (ref 30.0–36.0)

## 2011-04-25 MED ORDER — ACETIC ACID 0.25 % IR SOLN
Freq: Every day | Status: DC
  Administered 2011-04-25 – 2011-05-03 (×9)
  Filled 2011-04-25 (×5): qty 1000

## 2011-04-25 MED ORDER — BUPROPION HCL 100 MG PO TABS
100.0000 mg | ORAL_TABLET | Freq: Three times a day (TID) | ORAL | Status: DC
  Administered 2011-04-25 – 2011-05-03 (×23): 100 mg
  Filled 2011-04-25 (×29): qty 1

## 2011-04-25 MED ORDER — POTASSIUM CHLORIDE 20 MEQ/15ML (10%) PO LIQD
20.0000 meq | Freq: Every day | ORAL | Status: DC
  Administered 2011-04-26 – 2011-05-03 (×8): 20 meq
  Filled 2011-04-25 (×8): qty 15

## 2011-04-25 MED ORDER — BACLOFEN 20 MG PO TABS
20.0000 mg | ORAL_TABLET | Freq: Three times a day (TID) | ORAL | Status: DC
  Administered 2011-04-25 – 2011-05-03 (×23): 20 mg
  Filled 2011-04-25 (×28): qty 1

## 2011-04-25 MED ORDER — BUPROPION HCL 100 MG PO TABS: 100.0000 mg | ORAL_TABLET | Freq: Three times a day (TID) | ORAL | Status: DC

## 2011-04-25 NOTE — Progress Notes (Signed)
Subjective: Alert unable to speak but following the conversation   Objective: Vital signs in last 24 hours: Temp:  [97.7 F (36.5 C)-98.6 F (37 C)] 98.6 F (37 C) (04/17 0600) Pulse Rate:  [92-107] 107  (04/17 0600) Resp:  [18-19] 19  (04/17 0600) BP: (108-120)/(76-83) 120/83 mmHg (04/17 0600) SpO2:  [98 %-100 %] 98 % (04/17 0600) Weight change:  Last BM Date: 04/23/11  Intake/Output from previous day: 25-Apr-2022 0701 - 04/17 0700 In: -  Out: 1600 [Urine:1600]     Physical Exam: Restless, contractures UE>LE Spastic movements CVs: RRR Rs: CTAB Abdomen : excavated soft , G tube in place Staples in place   Lab Results:  Basename 04/25/11 0600 2011/04/25 0555  WBC 6.7 7.1  HGB 11.2* 10.8*  HCT 32.9* 32.1*  PLT 528* 482*    Basename 04/25/11 0600 2011/04/25 0555  NA 140 140  K 4.2 3.8  CL 105 107  CO2 23 23  GLUCOSE 112* 92  BUN 6 4*  CREATININE 0.62 0.58  CALCIUM 9.4 8.9   Recent Results (from the past 240 hour(s))  CULTURE, BLOOD (ROUTINE X 2)     Status: Normal (Preliminary result)   Collection Time   04/20/11  9:05 PM      Component Value Range Status Comment   Specimen Description BLOOD RIGHT ARM   Final    Special Requests BOTTLES DRAWN AEROBIC AND ANAEROBIC 10CC EACH   Final    Culture  Setup Time [id number]   Final    Culture     Final    Value:        BLOOD CULTURE RECEIVED NO GROWTH TO DATE CULTURE WILL BE HELD FOR 5 DAYS BEFORE ISSUING A FINAL NEGATIVE REPORT   Report Status PENDING   Incomplete   CULTURE, BLOOD (ROUTINE X 2)     Status: Normal (Preliminary result)   Collection Time   04/20/11  9:14 PM      Component Value Range Status Comment   Specimen Description BLOOD RIGHT HAND   Final    Special Requests BOTTLES DRAWN AEROBIC AND ANAEROBIC 10CC EACH   Final    Culture  Setup Time [id number]   Final    Culture     Final    Value:        BLOOD CULTURE RECEIVED NO GROWTH TO DATE CULTURE WILL BE HELD FOR 5 DAYS BEFORE ISSUING A FINAL NEGATIVE  REPORT   Report Status PENDING   Incomplete   URINE CULTURE     Status: Normal   Collection Time   04/21/11  6:51 AM      Component Value Range Status Comment   Specimen Description URINE, CATHETERIZED   Final    Special Requests NONE   Final    Culture  Setup Time [id number]   Final    Colony Count 50,000 COLONIES/ML   Final    Culture PSEUDOMONAS AERUGINOSA   Final    Report Status 04/23/2011 FINAL   Final    Organism ID, Bacteria PSEUDOMONAS AERUGINOSA   Final      Studies/Results: Dg Abd 2 Views  04/25/11  *RADIOLOGY REPORT*  Clinical Data: Evaluate pneumoperitoneum.  Abdominal pain.  ABDOMEN - 2 VIEW  Comparison: 04/21/2011.  Findings: There is a persistent pneumoperitoneum located superior to the right lobe the liver on the decubitus view.  This has mildly decreased in size.  The bowel gas pattern is normal.  There is a feeding gastrostomy tube present.  IMPRESSION: Pneumoperitoneum  which has mildly decreased in size.  Normal bowel gas pattern.  Original Report Authenticated By: Rolla Plate, M.D.    Medications: Scheduled Meds:    . acetic acid   Irrigation Daily  . baclofen  20 mg Per Tube TID  . buPROPion  100 mg Per Tube TID  . ciprofloxacin  400 mg Intravenous Q12H  . enoxaparin  40 mg Subcutaneous Q24H  . free water  150 mL Per Tube 6 X Daily  . interferon beta-1b  0.3 mg Subcutaneous QODAY  . metoCLOPramide  10 mg Per Tube Q6H  . potassium chloride  20 mEq Per Tube Daily  . DISCONTD: baclofen  10 mg Per Tube TID  . DISCONTD: buPROPion  100 mg Oral TID  . DISCONTD: potassium chloride  40 mEq Per Tube Daily   Continuous Infusions:    . feeding supplement (OSMOLITE 1.2 CAL) 1,000 mL (04/24/11 0742)   PRN Meds:.acetaminophen (TYLENOL) oral liquid 160 mg/5 mL, HYDROcodone-acetaminophen, HYDROmorphone, ondansetron (ZOFRAN) IV, ondansetron  Assessment/Plan: Principal Problem:  *UTI (lower urinary tract infection): Patient has a positive urinary sediment,  with marked pyuria, consistent with a UTI. He has just completed a 7-day course of Diflucan for funguria, during his recent hospitalization. ED PA gave a single dose of iv Rocephin in the ED, but given his history of recurrences, chronic Foley catheter and prophylactic Macrobid, this is a complicated UTI, and a resistant organism may be responsible. He was placed on Vancomycin/Primaxin now day# 4, with satisfactory clinical response. Wcc has normalized, he is no longer tachycardic, and feels considerably better.Urine culture grew Pseudomonas Aeruginosa, most sensitive to Imipenem and Ciprofloxacin. We have switched patient to Ciprofloxacin monotherapy from 04/24/11. Vancomycin has been discontinued. Total planned course of antibiotics, will be 10 days.  1. Mild Sepsis on admission: Patient appeared to have SIRS, and possibly even early sepsis on presentation. He had a leukocytosis, tachycardia and borderline low BP and was reportedly, feverish at home. CXR showed no acute disease. Lactic acid was only 1.3 and procalcitonin is <0.10, effectively ruling out severe sepsis. 2. Multiple sclerosis: This is advanced MS, and patient is under the care of Dr Lesia Sago, neurologist, and is on Betaseron, which we have continued. Family is requesting Fingolimod (Gilenya) 3. FTT (failure to thrive) in adult/Protein calorie malnutrition: This is occasioned by chronic dysphagia, due to multiple sclerosis, and is being addressed with artificial nutrition, via PEG tube.  4. Sacral decubitus ulcer, stage II: Patient does have a healing stage 2 sacral decubitus, which appears well granulated and un-infected. WOC consult recommendations have been obtained, and he has appropriate mattress overlay.  5. Pneumoperitoneum. This was an incidental finding on CXR, and was not seen on single view AXR. It is likely to be a legacy of patient's surgical PEG placement on 04/16/11. Have discussed with radiologist, and an acute abdominal series  has been recommended, to evaluate for progression. Patient has no abdominal tenderness, and bowel sounds are heard. I have discussed with Dr Violeta Gelinas, and he has assured me that following such a procedure, a pneumoperitoneum is anticipated for 7-10 days. We anticipate a repeat radiologic study on 04/24/11. 6. Diarrhea: Patent has had several bowel movements today, and while this may be a consequence of his artificial nutrition, there is concern for C.Difficile colitis, given recent and on-going antibiotic therapy. Have discontinued laxatives, without recurrence. C.Diff PCR is pending. 7. Bladder Calculi: Patient was found to have multiple bladder calculi on Pelvic CT scan of 03/12/11. This, in  addition to neurogenic bladder, is obviously yet another predisposition to UTIs. Urology consultation was provided by Dr Isabel Caprice, and he has opined that specific/invasive management of patient's calculi, will prove technically very difficult. He feels that given the entirety of patient's particular situation, it may be more prudent to continue to pursue issues of palliative care. 8. Dysphagia. Patient has dysphagia, due to MS, and per family, was able to tolerate soft mechanical diet, pre-admission. They are desirous of re-starting this, now that he is better. He is getting comfort pureed feeds  Comment: Of note the family has refused hospice services and palliative care consult in the past. They reversed his DNR during recent admission and continue to wish patient to be FULL CODE.        LOS: 5 days   Sairah Knobloch 04/25/2011, 1:25 PM

## 2011-04-26 LAB — VARICELLA ZOSTER ANTIBODY, IGG: Varicella IgG: 4.6 {ISR} — ABNORMAL HIGH

## 2011-04-26 LAB — GLUCOSE, CAPILLARY: Glucose-Capillary: 93 mg/dL (ref 70–99)

## 2011-04-26 NOTE — Progress Notes (Signed)
Utilization Review Completed.Terry Palmer T4/18/2013   

## 2011-04-26 NOTE — Progress Notes (Signed)
LOS 6    Subjective: Alert unable to speak but following the conversation   Objective: Vital signs in last 24 hours: Temp:  [97.6 F (36.4 C)-98.6 F (37 C)] 97.8 F (36.6 C) (04/18 0600) Pulse Rate:  [98-112] 112  (04/18 0600) Resp:  [16-18] 16  (04/18 0600) BP: (96-104)/(59-73) 96/59 mmHg (04/18 0600) SpO2:  [96 %-99 %] 97 % (04/18 0600) Weight change:  Last BM Date: 04/23/11  Intake/Output from previous day: 04/17 0701 - 04/18 0700 In: -  Out: 3750 [Urine:3750] Total I/O In: -  Out: 850 [Urine:850]   Physical Exam: contractures UE>LE Spastic movements CVs: RRR Rs: CTAB Abdomen : excavated soft , G tube in place, staples removed yesterday without complications    Lab Results:  Basename 04/25/11 0600 10-May-2011 0555  WBC 6.7 7.1  HGB 11.2* 10.8*  HCT 32.9* 32.1*  PLT 528* 482*    Basename 04/25/11 0600 10-May-2011 0555  NA 140 140  K 4.2 3.8  CL 105 107  CO2 23 23  GLUCOSE 112* 92  BUN 6 4*  CREATININE 0.62 0.58  CALCIUM 9.4 8.9   Recent Results (from the past 240 hour(s))  CULTURE, BLOOD (ROUTINE X 2)     Status: Normal (Preliminary result)   Collection Time   04/20/11  9:05 PM      Component Value Range Status Comment   Specimen Description BLOOD RIGHT ARM   Final    Special Requests BOTTLES DRAWN AEROBIC AND ANAEROBIC 10CC EACH   Final    Culture  Setup Time [id number]   Final    Culture     Final    Value:        BLOOD CULTURE RECEIVED NO GROWTH TO DATE CULTURE WILL BE HELD FOR 5 DAYS BEFORE ISSUING A FINAL NEGATIVE REPORT   Report Status PENDING   Incomplete   CULTURE, BLOOD (ROUTINE X 2)     Status: Normal (Preliminary result)   Collection Time   04/20/11  9:14 PM      Component Value Range Status Comment   Specimen Description BLOOD RIGHT HAND   Final    Special Requests BOTTLES DRAWN AEROBIC AND ANAEROBIC 10CC EACH   Final    Culture  Setup Time [id number]   Final    Culture     Final    Value:        BLOOD CULTURE RECEIVED NO GROWTH  TO DATE CULTURE WILL BE HELD FOR 5 DAYS BEFORE ISSUING A FINAL NEGATIVE REPORT   Report Status PENDING   Incomplete   URINE CULTURE     Status: Normal   Collection Time   04/21/11  6:51 AM      Component Value Range Status Comment   Specimen Description URINE, CATHETERIZED   Final    Special Requests NONE   Final    Culture  Setup Time [id number]   Final    Colony Count 50,000 COLONIES/ML   Final    Culture PSEUDOMONAS AERUGINOSA   Final    Report Status 04/23/2011 FINAL   Final    Organism ID, Bacteria PSEUDOMONAS AERUGINOSA   Final      Studies/Results: Dg Abd 2 Views  2011/05/10  *RADIOLOGY REPORT*  Clinical Data: Evaluate pneumoperitoneum.  Abdominal pain.  ABDOMEN - 2 VIEW  Comparison: 04/21/2011.  Findings: There is a persistent pneumoperitoneum located superior to the right lobe the liver on the decubitus view.  This has mildly decreased in size.  The bowel gas pattern  is normal.  There is a feeding gastrostomy tube present.  IMPRESSION: Pneumoperitoneum which has mildly decreased in size.  Normal bowel gas pattern.  Original Report Authenticated By: Rolla Plate, M.D.    Medications: Scheduled Meds:    . acetic acid   Irrigation Daily  . baclofen  20 mg Per Tube TID  . buPROPion  100 mg Per Tube TID  . ciprofloxacin  400 mg Intravenous Q12H  . enoxaparin  40 mg Subcutaneous Q24H  . free water  150 mL Per Tube 6 X Daily  . interferon beta-1b  0.3 mg Subcutaneous QODAY  . metoCLOPramide  10 mg Per Tube Q6H  . potassium chloride  20 mEq Per Tube Daily   Continuous Infusions:    . feeding supplement (OSMOLITE 1.2 CAL) 1,000 mL (04/26/11 1318)   PRN Meds:.acetaminophen (TYLENOL) oral liquid 160 mg/5 mL, HYDROcodone-acetaminophen, HYDROmorphone, ondansetron (ZOFRAN) IV, ondansetron  Assessment/Plan: Principal Problem:  *UTI (lower urinary tract infection): Patient has a positive urinary sediment, with marked pyuria, consistent with a UTI. He has just completed a  7-day course of Diflucan for funguria, during his recent hospitalization. ED PA gave a single dose of iv Rocephin in the ED, but given his history of recurrences, chronic Foley catheter and prophylactic Macrobid, this is a complicated UTI, and a resistant organism may be responsible. He was placed on Vancomycin/Primaxin, with satisfactory clinical response. Wcc has normalized, he is no longer tachycardic, and feels considerably better. Urine culture grew Pseudomonas Aeruginosa, most sensitive to Imipenem and Ciprofloxacin. We have switched patient to Ciprofloxacin monotherapy from 04/24/11. Vancomycin has been discontinued. Total planned course of antibiotics, will be 10 days. Today os day 6/10 of planned therapy   1. Mild Sepsis on admission: Patient appeared to have SIRS, and possibly even early sepsis on presentation. He had a leukocytosis, tachycardia and borderline low BP and was reportedly, feverish at home. CXR showed no acute disease. Lactic acid was only 1.3 and procalcitonin is <0.10, effectively ruling out severe sepsis. 2. Multiple sclerosis: This is advanced MS, and patient is under the care of Dr Lesia Sago, neurologist, and is on Betaseron, which we have continued. Family is requesting Fingolimod (Gilenya) - spoke with Dr. Anne Hahn and he will arrange as outpatient  Patient needs zoster atibodies - pending EKG - [facility] exam - at SNF  3. FTT (failure to thrive) in adult/Protein calorie malnutrition: This is occasioned by chronic dysphagia, due to multiple sclerosis, and is being addressed with artificial nutrition, via PEG tube.  4. Sacral decubitus ulcer, stage II: Patient does have a healing stage 2 sacral decubitus, which appears well granulated and un-infected. WOC consult recommendations have been obtained, and he has appropriate mattress overlay.  5. Pneumoperitoneum. This was an incidental finding on CXR, and was not seen on single view AXR. It is likely to be a legacy of patient's  surgical PEG placement on 04/16/11. Have discussed with radiologist, and an acute abdominal series has been recommended, to evaluate for progression. Patient has no abdominal tenderness, and bowel sounds are heard. I have discussed with Dr Violeta Gelinas, and he has assured me that following such a procedure, a pneumoperitoneum is anticipated for 7-10 days.  6. Diarrhea: Patent has had several bowel movements today, and while this may be a consequence of his artificial nutrition, there is concern for C.Difficile colitis, given recent and on-going antibiotic therapy. Have discontinued laxatives, without recurrence. C.Diff PCR is pending. 7. Bladder Calculi: Patient was found to have multiple bladder  calculi on Pelvic CT scan of 03/12/11. This, in addition to neurogenic bladder, is obviously yet another predisposition to UTIs. Urology consultation was provided by Dr Isabel Caprice, and he has opined that specific/invasive management of patient's calculi, will prove technically very difficult. He feels that given the entirety of patient's particular situation, it may be more prudent to continue to pursue issues of palliative care. 8. Dysphagia. Patient has dysphagia, due to MS, and per family, was able to tolerate soft mechanical diet, pre-admission. They are desirous of re-starting this, now that he is better. He is getting comfort pureed feeds  Comment: Of note the family has refused hospice services and palliative care consult in the past. They reversed his DNR during recent admission and continue to wish patient to be FULL CODE.   9. Awaiting snf placement     LOS: 6 days   Alexie Samson 04/26/2011, 2:11 PM

## 2011-04-26 NOTE — Progress Notes (Signed)
Clinical Social Worker spoke with patient family at bedside to further discuss patient discharge plans.  Patient family plans to tour [employer] this afternoon and determine whether plan will continue to be SNF versus home with home health.  CSW will follow up with patient family in the morning to discuss discharge plans further.  Clinical Social Worker spoke with patient mother over the phone this evening who is speaking with wife in detail tonight about further plans of care.  Patient mother is starting to verbalize her acceptance of patient severe decline and is wavering between continuing full treatment and making patient comfort care.  Patient mother feels she needs the night to "sleep on it" and she will provide her decisions in the morning with patient wife.  Patient mother is having feelings of guilt and selfishness about "keeping patient alive."  CSW offered continued support and recommended follow up Palliative Care Consult - patient family to discuss this evening and let CSW know.  Clinical Social Worker will continue to follow up with patient family for emotional support and discharge planning needs.  [address] [place], [place] [phone]

## 2011-04-27 LAB — GLUCOSE, CAPILLARY
Glucose-Capillary: 103 mg/dL — ABNORMAL HIGH (ref 70–99)
Glucose-Capillary: 98 mg/dL (ref 70–99)
Glucose-Capillary: 98 mg/dL (ref 70–99)

## 2011-04-27 LAB — CULTURE, BLOOD (ROUTINE X 2): Culture  Setup Time: 201304130224

## 2011-04-27 LAB — CREATININE, SERUM: GFR calc Af Amer: >90 mL/min (ref >90)

## 2011-04-27 MED ORDER — FREE WATER
200.0000 mL | Freq: Every day | Status: DC
  Administered 2011-04-27 – 2011-05-03 (×32): 200 mL

## 2011-04-27 MED ORDER — PRO-STAT SUGAR FREE PO LIQD
30.0000 mL | Freq: Every day | ORAL | Status: DC
  Administered 2011-04-27 – 2011-05-02 (×6): 30 mL
  Filled 2011-04-27 (×4): qty 30

## 2011-04-27 MED ORDER — DOCUSATE SODIUM 50 MG/5ML PO LIQD
100.0000 mg | Freq: Two times a day (BID) | ORAL | Status: DC
  Administered 2011-04-27 – 2011-05-03 (×12): 100 mg
  Filled 2011-04-27 (×15): qty 10

## 2011-04-27 MED ORDER — POLYETHYLENE GLYCOL 3350 17 G PO PACK
17.0000 g | PACK | Freq: Every day | ORAL | Status: DC | PRN
  Administered 2011-04-30: 17 g via ORAL
  Filled 2011-04-27: qty 1

## 2011-04-27 NOTE — Progress Notes (Signed)
Nutrition Follow-up  Diet Order:  NPO  Osmolite 1.2 @ 60 ml/hr via PEG; provides: 1728 kcals, 80 grams protein, 1166 ml H2O Free Water: 150 ml every 6 hours provides: 600 ml Total Free Water: 1766 ml Last BM documented 4/15 Pt continues to have 3 stage 2 wounds.  Per notes family considering SNF/HH vs comfort care  Meds: Scheduled Meds:   . acetic acid   Irrigation Daily  . baclofen  20 mg Per Tube TID  . buPROPion  100 mg Per Tube TID  . ciprofloxacin  400 mg Intravenous Q12H  . enoxaparin  40 mg Subcutaneous Q24H  . free water  150 mL Per Tube 6 X Daily  . interferon beta-1b  0.3 mg Subcutaneous QODAY  . metoCLOPramide  10 mg Per Tube Q6H  . potassium chloride  20 mEq Per Tube Daily   Continuous Infusions:   . feeding supplement (OSMOLITE 1.2 CAL) 1,000 mL (04/26/11 1318)   PRN Meds:.acetaminophen (TYLENOL) oral liquid 160 mg/5 mL, HYDROcodone-acetaminophen, HYDROmorphone, ondansetron (ZOFRAN) IV, ondansetron  Labs:  CMP     Component Value Date/Time   NA 140 04/25/2011 0600   K 4.2 04/25/2011 0600   CL 105 04/25/2011 0600   CO2 23 04/25/2011 0600   GLUCOSE 112* 04/25/2011 0600   BUN 6 04/25/2011 0600   CREATININE 0.78 04/27/2011 0600   CALCIUM 9.4 04/25/2011 0600   PROT 5.7* 04/23/2011 0606   ALBUMIN 2.5* 04/23/2011 0606   AST 20 04/23/2011 0606   ALT 14 04/23/2011 0606   ALKPHOS 75 04/23/2011 0606   BILITOT 0.2* 04/23/2011 0606   GFRNONAA >90 04/27/2011 0600   GFRAA >90 04/27/2011 0600    Intake/Output Summary (Last 24 hours) at 04/27/11 1142 Last data filed at 04/27/11 0700  Gross per 24 hour  Intake      0 ml  Output   2200 ml  Net  -2200 ml    Weight Status:   166 lbs 4/16 (admit wt 160 lbs)  Re-estimated needs:  Kcal: 1700-1900 Protein: 80-95 grams  Nutrition Dx:  Swallowing difficulty (NI-1.1). Status: Ongoing  Goal:  Goal: Provide >/= 90 % of his estimated needs; met  Intervention:    Add 30 ml Prostat daily  Increase free water to 200 ml every 6  hours New TF regimen will provide 1828 kcals, 95 grams protein, with total free water of 1966 ml/day   Monitor:  TF tolerance, weight   Kendell Bane Cornelison Pager #:  [phone]

## 2011-04-27 NOTE — Progress Notes (Signed)
LOS 7    Subjective: Alert unable to speak but following the conversation, and softly mumbles yes or no. Patient following commands.  Objective: Vital signs in last 24 hours: Temp:  [97.3 F (36.3 C)-99.1 F (37.3 C)] 97.5 F (36.4 C) (04/19 0600) Pulse Rate:  [93-113] 93  (04/19 0600) Resp:  [18] 18  (04/19 0600) BP: (103-106)/(68-69) 106/69 mmHg (04/19 0600) SpO2:  [99 %] 99 % (04/19 0600) Weight change:  Last BM Date: 04/23/11  Intake/Output from previous day: 04/18 0701 - 04/19 0700 In: -  Out: 2200 [Urine:2200]     Physical Exam: contractures UE>LE Spastic movements CVs: RRR Rs: CTAB anterior lung fields. Abdomen : excavated soft , G tube in place, staples removed 2 days ago, without complications    Lab Results:  Basename 04/25/11 0600  WBC 6.7  HGB 11.2*  HCT 32.9*  PLT 528*    Basename 04/27/11 0600 04/25/11 0600  NA -- 140  K -- 4.2  CL -- 105  CO2 -- 23  GLUCOSE -- 112*  BUN -- 6  CREATININE 0.78 0.62  CALCIUM -- 9.4   Recent Results (from the past 240 hour(s))  CULTURE, BLOOD (ROUTINE X 2)     Status: Normal   Collection Time   04/20/11  9:05 PM      Component Value Range Status Comment   Specimen Description BLOOD RIGHT ARM   Final    Special Requests BOTTLES DRAWN AEROBIC AND ANAEROBIC [facility]   Final    Culture  Setup Time [id number]   Final    Culture NO GROWTH 5 DAYS   Final    Report Status 04/27/2011 FINAL   Final   CULTURE, BLOOD (ROUTINE X 2)     Status: Normal   Collection Time   04/20/11  9:14 PM      Component Value Range Status Comment   Specimen Description BLOOD RIGHT HAND   Final    Special Requests BOTTLES DRAWN AEROBIC AND ANAEROBIC [facility]   Final    Culture  Setup Time [id number]   Final    Culture NO GROWTH 5 DAYS   Final    Report Status 04/27/2011 FINAL   Final   URINE CULTURE     Status: Normal   Collection Time   04/21/11  6:51 AM      Component Value Range Status Comment   Specimen Description  URINE, CATHETERIZED   Final    Special Requests NONE   Final    Culture  Setup Time [id number]   Final    Colony Count 50,000 COLONIES/ML   Final    Culture PSEUDOMONAS AERUGINOSA   Final    Report Status 04/23/2011 FINAL   Final    Organism ID, Bacteria PSEUDOMONAS AERUGINOSA   Final      Studies/Results: No results found.  Medications: Scheduled Meds:    . acetic acid   Irrigation Daily  . baclofen  20 mg Per Tube TID  . buPROPion  100 mg Per Tube TID  . ciprofloxacin  400 mg Intravenous Q12H  . enoxaparin  40 mg Subcutaneous Q24H  . feeding supplement  30 mL Per Tube Daily  . free water  200 mL Per Tube 6 X Daily  . interferon beta-1b  0.3 mg Subcutaneous QODAY  . metoCLOPramide  10 mg Per Tube Q6H  . potassium chloride  20 mEq Per Tube Daily  . DISCONTD: free water  150 mL Per Tube 6 X Daily  Continuous Infusions:    . feeding supplement (OSMOLITE 1.2 CAL) 1,000 mL (04/26/11 1318)   PRN Meds:.acetaminophen (TYLENOL) oral liquid 160 mg/5 mL, HYDROcodone-acetaminophen, HYDROmorphone, ondansetron (ZOFRAN) IV, ondansetron  Assessment/Plan: Principal Problem:   1.UTI (lower urinary tract infection): Patient has a positive urinary sediment, with marked pyuria, consistent with a UTI. He has just completed a 7-day course of Diflucan for funguria, during his recent hospitalization. ED PA gave a single dose of iv Rocephin in the ED, but given his history of recurrences, chronic Foley catheter and prophylactic Macrobid, this is a complicated UTI, and a resistant organism may be responsible. He was placed on Vancomycin/Primaxin, with satisfactory clinical response. Wcc has normalized, he is no longer tachycardic, and feels considerably better. Urine culture grew Pseudomonas Aeruginosa, most sensitive to Imipenem and Ciprofloxacin. We have switched patient to Ciprofloxacin monotherapy from 04/24/11. Vancomycin has been discontinued. Total planned course of antibiotics, will be 10 days.  Today is day 7/10-14 of planned therapy   2. Mild Sepsis on admission: Patient appeared to have SIRS, and possibly even early sepsis on presentation. He had a leukocytosis, tachycardia and borderline low BP and was reportedly, feverish at home. CXR showed no acute disease. Lactic acid was only 1.3 and procalcitonin is <0.10, effectively ruling out severe sepsis.Blood cultures negative x 2. Urine cx with pseudomonas.Continue current antibiotics. 3. Multiple sclerosis: This is advanced MS, and patient is under the care of Dr Lesia Sago, neurologist, and is on Betaseron, which we have continued. Family is requesting Fingolimod (Gilenya) -  Dr. Lavera Guise spoke with Dr. Anne Hahn and he will arrange as outpatient  Patient needs zoster atibodies which are elevated at 4.60 for varicella. EKG - [place] [facility] exam - at SNF  4. FTT (failure to thrive) in adult/Protein calorie malnutrition: This is occasioned by chronic dysphagia, due to multiple sclerosis, and is being addressed with artificial nutrition, via PEG tube.  5. Sacral decubitus ulcer, stage II: Patient does have a healing stage 2 sacral decubitus, which appears well granulated and un-infected. WOC consult recommendations have been obtained, and he has appropriate mattress overlay.  6. Pneumoperitoneum. This was an incidental finding on CXR, and was not seen on single view AXR. It is likely to be a legacy of patient's surgical PEG placement on 04/16/11. Have discussed with radiologist, and an acute abdominal series has been recommended, to evaluate for progression. Patient has no abdominal tenderness, and bowel sounds are heard. Dr Lavera Guise has discussed with Dr Violeta Gelinas, and he has assured him that following such a procedure, a pneumoperitoneum is anticipated for 7-10 days.  7. Diarrhea: Patent has had several bowel movements today, and while this may be a consequence of his artificial nutrition, there is concern for C.Difficile colitis, given recent and on-going  antibiotic therapy. Have discontinued laxatives, without recurrence. C.Diff PCR is pending. 8. Bladder Calculi: Patient was found to have multiple bladder calculi on Pelvic CT scan of 03/12/11. This, in addition to neurogenic bladder, is obviously yet another predisposition to UTIs. Urology consultation was provided by Dr Isabel Caprice, and he has opined that specific/invasive management of patient's calculi, will prove technically very difficult. He feels that given the entirety of patient's particular situation, it may be more prudent to continue to pursue issues of palliative care. 9. Dysphagia. Patient has dysphagia, due to MS, and per family, was able to tolerate soft mechanical diet, pre-admission. They are desirous of re-starting this, now that he is better. He is getting comfort pureed feeds  Comment: Of  note the family has refused hospice services and palliative care consult in the past. They reversed his DNR during recent admission and continue to wish patient to be FULL CODE.   10. Awaiting snf placement     LOS: 7 days   Terry Palmer,Terry Palmer 04/27/2011, 1:30 PM

## 2011-04-28 LAB — BASIC METABOLIC PANEL
BUN: 11 mg/dL (ref 6–23)
Creatinine, Ser: 0.71 mg/dL (ref 0.50–1.35)
GFR calc non Af Amer: >90 mL/min (ref >90)
Glucose, Bld: 113 mg/dL — ABNORMAL HIGH (ref 70–99)
Potassium: 4.1 mEq/L (ref 3.5–5.1)

## 2011-04-28 LAB — GLUCOSE, CAPILLARY: Glucose-Capillary: 120 mg/dL — ABNORMAL HIGH (ref 70–99)

## 2011-04-28 MED ORDER — WHITE PETROLATUM GEL
Status: AC
  Administered 2011-04-28: 17:00:00
  Filled 2011-04-28: qty 5

## 2011-04-28 NOTE — Progress Notes (Signed)
LOS 8    Subjective: Alert unable to speak but following the conversation, and softly mumbles yes or no. Patient following commands. No complaints.  Objective: Vital signs in last 24 hours: Temp:  [97.3 F (36.3 C)-98.6 F (37 C)] 97.3 F (36.3 C) (04/20 0639) Pulse Rate:  [86-108] 86  (04/20 0639) Resp:  [18] 18  (04/20 0639) BP: (102-106)/(67-72) 106/70 mmHg (04/20 0639) SpO2:  [98 %-100 %] 100 % (04/20 0639) Weight change:  Last BM Date: 04/27/11  Intake/Output from previous day: 04/19 0701 - 04/20 0700 In: 2920 [IV Piggyback:1800] Out: 2600 [Urine:2600]     Physical Exam: contractures UE>LE Spastic movements CVs: RRR Rs: CTAB anterior lung fields. Abdomen : excavated soft , G tube in place, staples removed 3 days ago, without complications    Lab Results: No results found for this basename: WBC:2,HGB:2,HCT:2,PLT:2 in the last 72 hours  Basename 04/28/11 0615 04/27/11 0600  NA 136 --  K 4.1 --  CL 101 --  CO2 23 --  GLUCOSE 113* --  BUN 11 --  CREATININE 0.71 0.78  CALCIUM 9.2 --   Recent Results (from the past 240 hour(s))  CULTURE, BLOOD (ROUTINE X 2)     Status: Normal   Collection Time   04/20/11  9:05 PM      Component Value Range Status Comment   Specimen Description BLOOD RIGHT ARM   Final    Special Requests BOTTLES DRAWN AEROBIC AND ANAEROBIC [facility]   Final    Culture  Setup Time [id number]   Final    Culture NO GROWTH 5 DAYS   Final    Report Status 04/27/2011 FINAL   Final   CULTURE, BLOOD (ROUTINE X 2)     Status: Normal   Collection Time   04/20/11  9:14 PM      Component Value Range Status Comment   Specimen Description BLOOD RIGHT HAND   Final    Special Requests BOTTLES DRAWN AEROBIC AND ANAEROBIC [facility]   Final    Culture  Setup Time [id number]   Final    Culture NO GROWTH 5 DAYS   Final    Report Status 04/27/2011 FINAL   Final   URINE CULTURE     Status: Normal   Collection Time   04/21/11  6:51 AM      Component  Value Range Status Comment   Specimen Description URINE, CATHETERIZED   Final    Special Requests NONE   Final    Culture  Setup Time [id number]   Final    Colony Count 50,000 COLONIES/ML   Final    Culture PSEUDOMONAS AERUGINOSA   Final    Report Status 04/23/2011 FINAL   Final    Organism ID, Bacteria PSEUDOMONAS AERUGINOSA   Final      Studies/Results: No results found.  Medications: Scheduled Meds:    . acetic acid   Irrigation Daily  . baclofen  20 mg Per Tube TID  . buPROPion  100 mg Per Tube TID  . ciprofloxacin  400 mg Intravenous Q12H  . docusate  100 mg Per Tube BID  . enoxaparin  40 mg Subcutaneous Q24H  . feeding supplement  30 mL Per Tube Daily  . free water  200 mL Per Tube 6 X Daily  . interferon beta-1b  0.3 mg Subcutaneous QODAY  . metoCLOPramide  10 mg Per Tube Q6H  . potassium chloride  20 mEq Per Tube Daily   Continuous Infusions:    .  feeding supplement (OSMOLITE 1.2 CAL) 1,000 mL (04/26/11 1318)   PRN Meds:.acetaminophen (TYLENOL) oral liquid 160 mg/5 mL, HYDROcodone-acetaminophen, HYDROmorphone, ondansetron (ZOFRAN) IV, ondansetron, polyethylene glycol  Assessment/Plan: Principal Problem:   1.UTI (lower urinary tract infection): Patient has a positive urinary sediment, with marked pyuria, consistent with a UTI. He has just completed a 7-day course of Diflucan for funguria, during his recent hospitalization. ED PA gave a single dose of iv Rocephin in the ED, but given his history of recurrences, chronic Foley catheter and prophylactic Macrobid, this is a complicated UTI, and a resistant organism may be responsible. He was placed on Vancomycin/Primaxin, with satisfactory clinical response. Wcc has normalized, he is no longer tachycardic, and feels considerably better. Urine culture grew Pseudomonas Aeruginosa, most sensitive to Imipenem and Ciprofloxacin. We have switched patient to Ciprofloxacin monotherapy from 04/24/11. Vancomycin has been discontinued.  Total planned course of antibiotics, will be 10-14 days. Today is day 8/10-14 of planned therapy   2. Mild Sepsis on admission: Patient appeared to have SIRS, and possibly even early sepsis on presentation. He had a leukocytosis, tachycardia and borderline low BP and was reportedly, feverish at home. CXR showed no acute disease. Lactic acid was only 1.3 and procalcitonin is <0.10, effectively ruling out severe sepsis.Blood cultures negative x 2. Urine cx with pseudomonas.Continue current antibiotics. 3. Multiple sclerosis: This is advanced MS, and patient is under the care of Dr Lesia Sago, neurologist, and is on Betaseron, which we have continued. Family is requesting Fingolimod (Gilenya) -  Dr. Lavera Guise spoke with Dr. Anne Hahn and he will arrange as outpatient  Patient's zoster atibodies are elevated at 4.60 for varicella. EKG - [place] [facility] exam - at SNF  4. FTT (failure to thrive) in adult/Protein calorie malnutrition: This is occasioned by chronic dysphagia, due to multiple sclerosis, and is being addressed with artificial nutrition, via PEG tube.  5. Sacral decubitus ulcer, stage II: Patient does have a healing stage 2 sacral decubitus, which appears well granulated and un-infected. WOC consult recommendations have been obtained, and he has appropriate mattress overlay.  6. Pneumoperitoneum. This was an incidental finding on CXR, and was not seen on single view AXR. It is likely to be a legacy of patient's surgical PEG placement on 04/16/11. Have discussed with radiologist, and an acute abdominal series has been recommended, to evaluate for progression. Patient has no abdominal tenderness, and bowel sounds are heard. Dr Lavera Guise has discussed with Dr Violeta Gelinas, and he has assured him that following such a procedure, a pneumoperitoneum is anticipated for 7-10 days.  7. Diarrhea: Patent has had several bowel movements today, and while this may be a consequence of his artificial nutrition, there is concern for  C.Difficile colitis, given recent and on-going antibiotic therapy. Have discontinued laxatives, without recurrence. C.Diff PCR is pending. 8. Bladder Calculi: Patient was found to have multiple bladder calculi on Pelvic CT scan of 03/12/11. This, in addition to neurogenic bladder, is obviously yet another predisposition to UTIs. Urology consultation was provided by Dr Isabel Caprice, and he has opined that specific/invasive management of patient's calculi, will prove technically very difficult. He feels that given the entirety of patient's particular situation, it may be more prudent to continue to pursue issues of palliative care. 9. Dysphagia. Patient has dysphagia, due to MS, and per family, was able to tolerate soft mechanical diet, pre-admission. They are desirous of re-starting this, now that he is better. He is getting comfort pureed feeds  Comment: Of note the family has refused hospice  services and palliative care consult in the past. They reversed his DNR during recent admission and continue to wish patient to be FULL CODE.   10. Awaiting snf placement     LOS: 8 days   Terry Palmer,Kimon 04/28/2011, 1:29 PM

## 2011-04-29 LAB — GLUCOSE, CAPILLARY
Glucose-Capillary: 91 mg/dL (ref 70–99)
Glucose-Capillary: 97 mg/dL (ref 70–99)
Glucose-Capillary: 109 mg/dL — ABNORMAL HIGH (ref 70–99)
Glucose-Capillary: 98 mg/dL (ref 70–99)
Glucose-Capillary: 95 mg/dL (ref 70–99)

## 2011-04-29 MED ORDER — BISACODYL 10 MG RE SUPP
10.0000 mg | Freq: Once | RECTAL | Status: AC
  Administered 2011-04-29: 10 mg via RECTAL
  Filled 2011-04-29: qty 1

## 2011-04-29 NOTE — Progress Notes (Signed)
LOS 9    Subjective:  No new events  Objective: Vital signs in last 24 hours: Temp:  [97.1 F (36.2 C)-98.4 F (36.9 C)] 98.4 F (36.9 C) (04/21 0529) Pulse Rate:  [95-103] 95  (04/21 0529) Resp:  [18-20] 18  (04/21 0529) BP: (101-125)/(61-80) 102/61 mmHg (04/21 0529) SpO2:  [99 %-100 %] 99 % (04/21 0529) Weight change:  Last BM Date: 04/27/11  Intake/Output from previous day: 04/20 0701 - 04/21 0700 In: 1560 [IV Piggyback:400] Out: 2450 [Urine:2450]     Physical Exam: contractures UE>LE Spastic movements CVs: RRR Rs: CTAB anterior lung fields. Abdomen : excavated soft , G tube in place, staples removed 3 days ago, without complications    Lab Results: No results found for this basename: WBC:2,HGB:2,HCT:2,PLT:2 in the last 72 hours  Basename 04/28/11 0615 04/27/11 0600  NA 136 --  K 4.1 --  CL 101 --  CO2 23 --  GLUCOSE 113* --  BUN 11 --  CREATININE 0.71 0.78  CALCIUM 9.2 --   Recent Results (from the past 240 hour(s))  CULTURE, BLOOD (ROUTINE X 2)     Status: Normal   Collection Time   04/20/11  9:05 PM      Component Value Range Status Comment   Specimen Description BLOOD RIGHT ARM   Final    Special Requests BOTTLES DRAWN AEROBIC AND ANAEROBIC [facility]   Final    Culture  Setup Time [id number]   Final    Culture NO GROWTH 5 DAYS   Final    Report Status 04/27/2011 FINAL   Final   CULTURE, BLOOD (ROUTINE X 2)     Status: Normal   Collection Time   04/20/11  9:14 PM      Component Value Range Status Comment   Specimen Description BLOOD RIGHT HAND   Final    Special Requests BOTTLES DRAWN AEROBIC AND ANAEROBIC [facility]   Final    Culture  Setup Time [id number]   Final    Culture NO GROWTH 5 DAYS   Final    Report Status 04/27/2011 FINAL   Final   URINE CULTURE     Status: Normal   Collection Time   04/21/11  6:51 AM      Component Value Range Status Comment   Specimen Description URINE, CATHETERIZED   Final    Special Requests NONE    Final    Culture  Setup Time [id number]   Final    Colony Count 50,000 COLONIES/ML   Final    Culture PSEUDOMONAS AERUGINOSA   Final    Report Status 04/23/2011 FINAL   Final    Organism ID, Bacteria PSEUDOMONAS AERUGINOSA   Final      Studies/Results: No results found.  Medications: Scheduled Meds:    . acetic acid   Irrigation Daily  . baclofen  20 mg Per Tube TID  . buPROPion  100 mg Per Tube TID  . ciprofloxacin  400 mg Intravenous Q12H  . docusate  100 mg Per Tube BID  . enoxaparin  40 mg Subcutaneous Q24H  . feeding supplement  30 mL Per Tube Daily  . free water  200 mL Per Tube 6 X Daily  . interferon beta-1b  0.3 mg Subcutaneous QODAY  . metoCLOPramide  10 mg Per Tube Q6H  . potassium chloride  20 mEq Per Tube Daily  . white petrolatum       Continuous Infusions:    . feeding supplement (OSMOLITE 1.2 CAL)  1,000 mL (04/29/11 0528)   PRN Meds:.acetaminophen (TYLENOL) oral liquid 160 mg/5 mL, HYDROcodone-acetaminophen, HYDROmorphone, ondansetron (ZOFRAN) IV, ondansetron, polyethylene glycol  Assessment/Plan: Principal Problem:   1.UTI (lower urinary tract infection): Patient has a positive urinary sediment, with marked pyuria, consistent with a UTI. He has just completed a 7-day course of Diflucan for funguria, during his recent hospitalization. ED PA gave a single dose of iv Rocephin in the ED, but given his history of recurrences, chronic Foley catheter and prophylactic Macrobid, this is a complicated UTI, and a resistant organism may be responsible. He was placed on Vancomycin/Primaxin, with satisfactory clinical response. Wcc has normalized, he is no longer tachycardic, and feels considerably better. Urine culture grew Pseudomonas Aeruginosa, most sensitive to Imipenem and Ciprofloxacin. We have switched patient to Ciprofloxacin monotherapy from 04/24/11. Vancomycin has been discontinued. Total planned course of antibiotics, will be 10-14 days. Today is day 8/10-14 of  planned therapy   2. Mild Sepsis on admission: Patient appeared to have SIRS, and possibly even early sepsis on presentation. He had a leukocytosis, tachycardia and borderline low BP and was reportedly, feverish at home. CXR showed no acute disease. Lactic acid was only 1.3 and procalcitonin is <0.10, effectively ruling out severe sepsis.Blood cultures negative x 2. Urine cx with pseudomonas.Completed abx course 04/29/11.  3. Multiple sclerosis: This is advanced MS, and patient is under the care of Dr Lesia Sago, neurologist, and is on Betaseron, which we have continued. Family is requesting Fingolimod (Gilenya) -  Dr. Lavera Guise spoke with Dr. Anne Hahn and he will arrange as outpatient  Patient's zoster atibodies are elevated at 4.60 for varicella.  EKG - [place] [facility] exam - at SNF  4. FTT (failure to thrive) in adult/Protein calorie malnutrition: This is occasioned by chronic dysphagia, due to multiple sclerosis, and is being addressed with artificial nutrition, via PEG tube.  5. Sacral decubitus ulcer, stage II: Patient does have a healing stage 2 sacral decubitus, which appears well granulated and un-infected. WOC consult recommendations have been obtained, and he has appropriate mattress overlay.  6. Pneumoperitoneum. This was an incidental finding on CXR, and was not seen on single view AXR. It is likely to be a legacy of patient's surgical PEG placement on 04/16/11. Have discussed with radiologist, and an acute abdominal series has been recommended, to evaluate for progression. Patient has no abdominal tenderness, and bowel sounds are heard. Dr Brien Few has discussed with Dr Violeta Gelinas, and he has assured him that following such a procedure, a pneumoperitoneum is anticipated for 7-10 days.  7. Diarrhea: Patent has had several bowel movements today, and while this may be a consequence of his artificial nutrition, there is concern for C.Difficile colitis, given recent and on-going antibiotic therapy. Have  discontinued laxatives, without recurrence. C.Diff PCR is pending. 8. Bladder Calculi: Patient was found to have multiple bladder calculi on Pelvic CT scan of 03/12/11. This, in addition to neurogenic bladder, is obviously yet another predisposition to UTIs. Urology consultation was provided by Dr Isabel Caprice, and he has opined that specific/invasive management of patient's calculi, will prove technically very difficult. He feels that given the entirety of patient's particular situation, it may be more prudent to continue to pursue issues of palliative care. 9. Dysphagia. Patient has dysphagia, due to MS, and per family, was able to tolerate soft mechanical diet, pre-admission. They are desirous of re-starting this, now that he is better. He is getting comfort pureed feeds  Comment: Of note the family has refused hospice services and  palliative care consult in the past. They reversed his DNR during recent admission and continue to wish patient to be FULL CODE.   10. Awaiting snf placement     LOS: 9 days   Providence Stivers 04/29/2011, 8:35 AM

## 2011-04-30 MED ORDER — CIPROFLOXACIN HCL 500 MG PO TABS
500.0000 mg | ORAL_TABLET | Freq: Two times a day (BID) | ORAL | Status: DC
  Administered 2011-04-30 – 2011-05-03 (×6): 500 mg
  Filled 2011-04-30 (×11): qty 1

## 2011-04-30 NOTE — Progress Notes (Signed)
LOS 10    Subjective:  No new events  Objective: Vital signs in last 24 hours: Temp:  [98.4 F (36.9 C)-100.6 F (38.1 C)] 98.5 F (36.9 C) (04/22 1355) Pulse Rate:  [105-115] 113  (04/22 1355) Resp:  [18-20] 18  (04/22 1355) BP: (97-115)/(64-74) 114/67 mmHg (04/22 1355) SpO2:  [94 %-99 %] 94 % (04/22 1355) Weight change:  Last BM Date: 04/27/11  Intake/Output from previous day: 04/21 0701 - 04/22 0700 In: 1320  Out: 4050 [Urine:4050] Total I/O In: -  Out: 700 [Urine:700]   Physical Exam: contractures UE>LE Spastic movements CVs: RRR Rs: CTAB anterior lung fields. Abdomen : excavated soft , G tube in place,     Lab Results: No results found for this basename: WBC:2,HGB:2,HCT:2,PLT:2 in the last 72 hours  Basename 04/28/11 0615  NA 136  K 4.1  CL 101  CO2 23  GLUCOSE 113*  BUN 11  CREATININE 0.71  CALCIUM 9.2   Recent Results (from the past 240 hour(s))  CULTURE, BLOOD (ROUTINE X 2)     Status: Normal   Collection Time   04/20/11  9:05 PM      Component Value Range Status Comment   Specimen Description BLOOD RIGHT ARM   Final    Special Requests BOTTLES DRAWN AEROBIC AND ANAEROBIC [facility]   Final    Culture  Setup Time [id number]   Final    Culture NO GROWTH 5 DAYS   Final    Report Status 04/27/2011 FINAL   Final   CULTURE, BLOOD (ROUTINE X 2)     Status: Normal   Collection Time   04/20/11  9:14 PM      Component Value Range Status Comment   Specimen Description BLOOD RIGHT HAND   Final    Special Requests BOTTLES DRAWN AEROBIC AND ANAEROBIC [facility]   Final    Culture  Setup Time [id number]   Final    Culture NO GROWTH 5 DAYS   Final    Report Status 04/27/2011 FINAL   Final   URINE CULTURE     Status: Normal   Collection Time   04/21/11  6:51 AM      Component Value Range Status Comment   Specimen Description URINE, CATHETERIZED   Final    Special Requests NONE   Final    Culture  Setup Time [id number]   Final    Colony Count  50,000 COLONIES/ML   Final    Culture PSEUDOMONAS AERUGINOSA   Final    Report Status 04/23/2011 FINAL   Final    Organism ID, Bacteria PSEUDOMONAS AERUGINOSA   Final      Studies/Results: No results found.  Medications: Scheduled Meds:    . acetic acid   Irrigation Daily  . baclofen  20 mg Per Tube TID  . buPROPion  100 mg Per Tube TID  . ciprofloxacin  500 mg Per Tube BID  . docusate  100 mg Per Tube BID  . enoxaparin  40 mg Subcutaneous Q24H  . feeding supplement  30 mL Per Tube Daily  . free water  200 mL Per Tube 6 X Daily  . interferon beta-1b  0.3 mg Subcutaneous QODAY  . metoCLOPramide  10 mg Per Tube Q6H  . potassium chloride  20 mEq Per Tube Daily   Continuous Infusions:    . feeding supplement (OSMOLITE 1.2 CAL) 1,000 mL (04/30/11 1225)   PRN Meds:.acetaminophen (TYLENOL) oral liquid 160 mg/5 mL, HYDROcodone-acetaminophen, HYDROmorphone, ondansetron (ZOFRAN)  IV, ondansetron, polyethylene glycol  Assessment/Plan: Principal Problem:   1.UTI (lower urinary tract infection): Patient has a positive urinary sediment, with marked pyuria, consistent with a UTI. He has just completed a 7-day course of Diflucan for funguria, during his recent hospitalization. ED PA gave a single dose of iv Rocephin in the ED, but given his history of recurrences, chronic Foley catheter and prophylactic Macrobid, this is a complicated UTI, and a resistant organism may be responsible. He was placed on Vancomycin/Primaxin, with satisfactory clinical response. Wcc has normalized, he is no longer tachycardic, and feels considerably better. Urine culture grew Pseudomonas Aeruginosa, most sensitive to Imipenem and Ciprofloxacin. We have switched patient to Ciprofloxacin monotherapy from 04/24/11. Vancomycin has been discontinued. Total planned course of antibiotics, will be 14 days.   2. Mild Sepsis on admission: Patient appeared to have SIRS, and possibly even early sepsis on presentation. He had a  leukocytosis, tachycardia and borderline low BP and was reportedly, feverish at home. CXR showed no acute disease. Lactic acid was only 1.3 and procalcitonin is <0.10, effectively ruling out severe sepsis.Blood cultures negative x 2. Urine cx with pseudomonas.Completed abx course 04/29/11.  3. Multiple sclerosis: This is advanced MS, and patient is under the care of Dr Lesia Sago, neurologist, and is on Betaseron, which we have continued. Family is requesting Fingolimod (Gilenya) -  Dr. Lavera Guise spoke with Dr. Anne Hahn and he will arrange as outpatient  Patient's zoster atibodies are elevated at 4.60 for varicella.  EKG - [place] Eye exam - at SNF  4. FTT (failure to thrive) in adult/Protein calorie malnutrition: This is occasioned by chronic dysphagia, due to multiple sclerosis, and is being addressed with artificial nutrition, via PEG tube.  5. Sacral decubitus ulcer, stage II: Patient does have a healing stage 2 sacral decubitus, which appears well granulated and un-infected. WOC consult recommendations have been obtained, and he has appropriate mattress overlay.  6. Pneumoperitoneum. This was an incidental finding on CXR, and was not seen on single view AXR. It is likely to be a legacy of patient's surgical PEG placement on 04/16/11. Have discussed with radiologist, and an acute abdominal series has been recommended, to evaluate for progression. Patient has no abdominal tenderness, and bowel sounds are heard. Dr Brien Few has discussed with Dr Violeta Gelinas, and he has assured him that following such a procedure, a pneumoperitoneum is anticipated for 7-10 days.  7. Diarrhea: Patent has had several bowel movements today, and while this may be a consequence of his artificial nutrition, there is concern for C.Difficile colitis, given recent and on-going antibiotic therapy. Have discontinued laxatives, without recurrence. C.Diff PCR is pending. 8. Bladder Calculi: Patient was found to have multiple bladder calculi on  Pelvic CT scan of 03/12/11. This, in addition to neurogenic bladder, is obviously yet another predisposition to UTIs. Urology consultation was provided by Dr Isabel Caprice, and he has opined that specific/invasive management of patient's calculi, will prove technically very difficult. He feels that given the entirety of patient's particular situation, it may be more prudent to continue to pursue issues of palliative care. Dr. Retta Diones recommended acetic acid irrigation  9. Dysphagia. Patient has dysphagia, due to MS, and per family, was able to tolerate soft mechanical diet, pre-admission. They are desirous of re-starting this, now that he is better. He is getting comfort pureed feeds  Comment: Of note the family has refused hospice services and palliative care consult in the past. They reversed his DNR during recent admission and continue to wish patient  to be FULL CODE.   10. Awaiting snf placement     LOS: 10 days   Feliciana Narayan 04/30/2011, 6:26 PM

## 2011-04-30 NOTE — Progress Notes (Signed)
Clinical Social Work  CSW met with patient's wife who stated she felt patient needed to go to SNF due to complications with medical problems. Wife expressed concern with distance from home to SNF. CSW provided wife with directions. CSW informed SNF ([facility]) of patient's desire to go to their facility. CSW sent additional information to facility for insurance approval. CSW will continue to follow.  [place], [place] [phone]

## 2011-04-30 NOTE — Progress Notes (Signed)
Utilization Review Completed.Terry Palmer T4/22/2013   

## 2011-04-30 NOTE — Progress Notes (Signed)
Clinical Social Work  CSW received call from CM that patient was not accepted into LTAC. CSW went to room and spoke with mother while patient slept. Mother is aware that LTAC is not an option but is not agreeable to making decision for [facility] vs SNF. CSW called wife [phone]) and left a message regarding SNF vs HH. Per mom, she and wife visited SNF but did not even tour facility due to feeling it was too far from her house. CSW will continue to follow.  [place], [place] [phone]

## 2011-05-01 LAB — GLUCOSE, CAPILLARY: Glucose-Capillary: 107 mg/dL — ABNORMAL HIGH (ref 70–99)

## 2011-05-01 MED ORDER — BISACODYL 10 MG RE SUPP
10.0000 mg | Freq: Once | RECTAL | Status: AC
  Administered 2011-05-01: 10 mg via RECTAL

## 2011-05-01 NOTE — Progress Notes (Signed)
LOS 11    Subjective Seems less interactive  Objective: Vital signs in last 24 hours: Temp:  [97.3 F (36.3 C)-98 F (36.7 C)] 98 F (36.7 C) (04/23 1310) Pulse Rate:  [104-112] 112  (04/23 1310) Resp:  [15-18] 15  (04/23 1310) BP: (105-119)/(71-79) 106/71 mmHg (04/23 1310) SpO2:  [95 %-96 %] 95 % (04/23 1310) Weight change:  Last BM Date: 04/27/11  Intake/Output from previous day: 04/22 0701 - 04/23 0700 In: 1480  Out: 2350 [Urine:2350]     Physical Exam: contractures UE>LE Spastic movements CVs: RRR Rs: CTAB anterior lung fields. Abdomen : excavated soft , G tube in place,     Lab Results: No results found for this basename: WBC:2,HGB:2,HCT:2,PLT:2 in the last 72 hours No results found for this basename: NA:2,K:2,CL:2,CO2:2,GLUCOSE:2,BUN:2,CREATININE:2,CALCIUM:2 in the last 72 hours No results found for this or any previous visit (from the past 240 hour(s)).   Studies/Results: No results found.  Medications: Scheduled Meds:    . acetic acid   Irrigation Daily  . baclofen  20 mg Per Tube TID  . bisacodyl  10 mg Rectal Once  . buPROPion  100 mg Per Tube TID  . ciprofloxacin  500 mg Per Tube BID  . docusate  100 mg Per Tube BID  . enoxaparin  40 mg Subcutaneous Q24H  . feeding supplement  30 mL Per Tube Daily  . free water  200 mL Per Tube 6 X Daily  . interferon beta-1b  0.3 mg Subcutaneous QODAY  . metoCLOPramide  10 mg Per Tube Q6H  . potassium chloride  20 mEq Per Tube Daily   Continuous Infusions:    . feeding supplement (OSMOLITE 1.2 CAL) 1,000 mL (04/30/11 1225)   PRN Meds:.acetaminophen (TYLENOL) oral liquid 160 mg/5 mL, HYDROcodone-acetaminophen, HYDROmorphone, ondansetron (ZOFRAN) IV, ondansetron, polyethylene glycol  Assessment/Plan:    Patient seems to be again sliding backwards - low grade fever, decreased responsiveness, We will resend urine culture and also do blood cultures if T >101   1.UTI (lower urinary tract infection):  Patient has a positive urinary sediment, with marked pyuria, consistent with a UTI. He has just completed a 7-day course of Diflucan for funguria, during his recent hospitalization. ED PA gave a single dose of iv Rocephin in the ED, but given his history of recurrences, chronic Foley catheter and prophylactic Macrobid, this is a complicated UTI, and a resistant organism may be responsible. He was placed on Vancomycin/Primaxin, with satisfactory clinical response. Wcc has normalized, he is no longer tachycardic, and feels considerably better. Urine culture grew Pseudomonas Aeruginosa, most sensitive to Imipenem and Ciprofloxacin. We have switched patient to Ciprofloxacin monotherapy from 04/24/11. Vancomycin has been discontinued. Total planned course of antibiotics, will be 14 days.   2. Mild Sepsis on admission: Patient appeared to have SIRS, and possibly even early sepsis on presentation. He had a leukocytosis, tachycardia and borderline low BP and was reportedly, feverish at home. CXR showed no acute disease. Lactic acid was only 1.3 and procalcitonin is <0.10, effectively ruling out severe sepsis.Blood cultures negative x 2. Urine cx with pseudomonas.Completed abx course 04/29/11.  3. Multiple sclerosis: This is advanced MS, and patient is under the care of Dr Lesia Sago, neurologist, and is on Betaseron, which we have continued. Family is requesting Fingolimod (Gilenya) -  Dr. Lavera Guise spoke with Dr. Anne Hahn and he will arrange as outpatient  Patient's zoster atibodies are elevated at 4.60 for varicella.  EKG - [place] Eye exam - at SNF  4.  FTT (failure to thrive) in adult/Protein calorie malnutrition: This is occasioned by chronic dysphagia, due to multiple sclerosis, and is being addressed with artificial nutrition, via PEG tube.  5. Sacral decubitus ulcer, stage II: Patient does have a healing stage 2 sacral decubitus, which appears well granulated and un-infected. WOC consult recommendations have been  obtained, and he has appropriate mattress overlay.  6. Pneumoperitoneum. This was an incidental finding on CXR, and was not seen on single view AXR. It is likely to be a legacy of patient's surgical PEG placement on 04/16/11. Have discussed with radiologist, and an acute abdominal series has been recommended, to evaluate for progression. Patient has no abdominal tenderness, and bowel sounds are heard. Dr Brien Few has discussed with Dr Violeta Gelinas, and he has assured him that following such a procedure, a pneumoperitoneum is anticipated for 7-10 days.   7. Bladder Calculi: Patient was found to have multiple bladder calculi on Pelvic CT scan of 03/12/11. This, in addition to neurogenic bladder, is obviously yet another predisposition to UTIs. Urology consultation was provided by Dr Isabel Caprice, and he has opined that specific/invasive management of patient's calculi, will prove technically very difficult. He feels that given the entirety of patient's particular situation, it may be more prudent to continue to pursue issues of palliative care. Dr. Retta Diones recommended acetic acid irrigation  8. Dysphagia.  Comment: Of note the family has refused hospice services and palliative care consult in the past. They reversed his DNR during recent admission and continue to wish patient to be FULL CODE.   9. Awaiting snf placement     LOS: 11 days   Archita Lomeli 05/01/2011, 9:41 PM

## 2011-05-01 NOTE — Progress Notes (Signed)
Clinical Social Work  CSW called SNF [facility]) who stated they had submitted authorization but still did not have approval from [employer]. SNF stated they would call CSW as soon as insurance contacted them. CSW will continue to follow.  [place], [place] [phone]

## 2011-05-02 ENCOUNTER — Inpatient Hospital Stay (HOSPITAL_COMMUNITY): Payer: Medicare HMO

## 2011-05-02 LAB — GLUCOSE, CAPILLARY
Glucose-Capillary: 133 mg/dL — ABNORMAL HIGH (ref 70–99)
Glucose-Capillary: 114 mg/dL — ABNORMAL HIGH (ref 70–99)

## 2011-05-02 LAB — BASIC METABOLIC PANEL
Chloride: 101 mEq/L (ref 96–112)
Creatinine, Ser: 0.83 mg/dL (ref 0.50–1.35)
GFR calc Af Amer: >90 mL/min (ref >90)
GFR calc non Af Amer: >90 mL/min (ref >90)
Potassium: 4 mEq/L (ref 3.5–5.1)

## 2011-05-02 LAB — CBC
MCH: 27.9 pg (ref 26.0–34.0)
MCV: 83.1 fL (ref 78.0–100.0)
Platelets: 453 10*3/uL — ABNORMAL HIGH (ref 150–400)
RBC: 4.33 MIL/uL (ref 4.22–5.81)
RDW: 14.8 % (ref 11.5–15.5)
WBC: 11.9 10*3/uL — ABNORMAL HIGH (ref 4.0–10.5)

## 2011-05-02 MED ORDER — BISACODYL 10 MG RE SUPP
10.0000 mg | Freq: Once | RECTAL | Status: AC
  Administered 2011-05-02: 10 mg via RECTAL
  Filled 2011-05-02: qty 1

## 2011-05-02 MED ORDER — POLYETHYLENE GLYCOL 3350 17 G PO PACK
17.0000 g | PACK | Freq: Two times a day (BID) | ORAL | Status: DC
  Administered 2011-05-02 (×2): 17 g via ORAL
  Filled 2011-05-02 (×4): qty 1

## 2011-05-02 MED ORDER — MINERAL OIL RE ENEM
1.0000 | ENEMA | Freq: Once | RECTAL | Status: DC
  Filled 2011-05-02: qty 1

## 2011-05-02 NOTE — Progress Notes (Signed)
Clinical Social Work  Per MD, patient is not ready to dc today. CSW called SNF [facility]) and updated facility on patient's dc plans. SNF still agreeable to admission. CSW received call from DSS Angelina Pih [phone]) regarding patient. CSW returned phone call and left a message with DSS. CSW will continue to follow to assist with dc needs.  [place], [place] [phone]

## 2011-05-02 NOTE — Progress Notes (Signed)
Clinical Social Work  CSW received call from SNF stating that they received approval from insurance. Patient can dc to SNF when medically ready.  [place], [place] [phone]

## 2011-05-02 NOTE — Progress Notes (Signed)
Subjective: Per family patient more awake. He smile once.  No bowel movement since 16 ?  Objective: Filed Vitals:   05/01/11 0600 05/01/11 1310 05/01/11 2200 05/02/11 0600  BP: 119/79 106/71 130/86 112/75  Pulse: 104 112 100 106  Temp: 97.3 F (36.3 C) 98 F (36.7 C) 98 F (36.7 C) 97.8 F (36.6 C)  TempSrc: Oral Oral Oral Oral  Resp: 18 15 18 18   Height:      Weight:      SpO2: 95% 95% 98% 98%   Weight change:   Intake/Output Summary (Last 24 hours) at 05/02/11 1215 Last data filed at 05/01/11 1758  Gross per 24 hour  Intake      0 ml  Output    600 ml  Net   -600 ml    General: Alert, awake,, in no acute distress.  HEENT: No bruits, no goiter.  Heart: Regular rate and rhythm, without murmurs, rubs, gallops.  Lungs: Crackles left side, bilateral air movement.  Abdomen: Soft, nontender, nondistended, positive bowel sounds.  Neuro: contracture extremities. Eyes open.    Lab Results:  [facility] 05/02/11 0535  NA 137  K 4.0  CL 101  CO2 23  GLUCOSE 100*  BUN 15  CREATININE 0.83  CALCIUM 9.6  MG --  PHOS --    Basename 05/02/11 0535  WBC 11.9*  NEUTROABS --  HGB 12.1*  HCT 36.0*  MCV 83.1  PLT 453*    Micro Results: No results found for this or any previous visit (from the past 240 hour(s)).  Studies/Results: No results found.  Medications: I have reviewed the patient's current medications.  1.UTI (lower urinary tract infection): Patient has a positive urinary sediment, with marked pyuria, consistent with a UTI. He has just completed a 7-day course of Diflucan for funguria, during his recent hospitalization. ED PA gave a single dose of iv Rocephin in the ED, but given his history of recurrences, chronic Foley catheter and prophylactic Macrobid, this is a complicated UTI, and a resistant organism may be responsible. He was placed on Vancomycin/Primaxin, with satisfactory clinical response. . Urine culture grew Pseudomonas Aeruginosa, most sensitive to  Imipenem and Ciprofloxacin. We have switched patient to Ciprofloxacin monotherapy from 04/24/11. Vancomycin has been discontinued. Total planned course of antibiotics, will be 14 days.  Will monitor for fever, repeat cbc in am.   2. Mild Sepsis on admission: Patient appeared to have SIRS, and possibly even early sepsis on presentation. He had a leukocytosis, tachycardia and borderline low BP and was reportedly, feverish at home. CXR showed no acute disease. Lactic acid was only 1.3 and procalcitonin is <0.10, effectively ruling out severe sepsis.Blood cultures negative x 2. Urine cx with pseudomonas.Completed abx course 04/29/11.  3. Multiple sclerosis: This is advanced MS, and patient is under the care of Dr Lesia Sago, neurologist, and is on Betaseron, which we have continued. Family is requesting Fingolimod (Gilenya) - Dr. Lavera Guise spoke with Dr. Anne Hahn and he will arrange as outpatient  Patient's zoster atibodies are elevated at 4.60 for varicella.  EKG - [place]  Eye exam - at SNF  4. FTT (failure to thrive) in adult/Protein calorie malnutrition: This is occasioned by chronic dysphagia, due to multiple sclerosis, and is being addressed with artificial nutrition, via PEG tube.  5. Sacral decubitus ulcer, stage II: Patient does have a healing stage 2 sacral decubitus, which appears well granulated and un-infected. WOC consult recommendations have been obtained, and he has appropriate mattress overlay.  6. Pneumoperitoneum.  This was an incidental finding on CXR, and was not seen on single view AXR. It is likely to be a legacy of patient's surgical PEG placement on 04/16/11. Have discussed with radiologist, and an acute abdominal series has been recommended, to evaluate for progression. Patient has no abdominal tenderness, and bowel sounds are heard. Dr Brien Few has discussed with Dr Violeta Gelinas, and he has assured him that following such a procedure, a pneumoperitoneum is anticipated for 7-10 days. Repeat KUB. 7.  Bladder Calculi: Patient was found to have multiple bladder calculi on Pelvic CT scan of 03/12/11. This, in addition to neurogenic bladder, is obviously yet another predisposition to UTIs. Urology consultation was provided by Dr Isabel Caprice, and he has opined that specific/invasive management of patient's calculi, will prove technically very difficult. He feels that given the entirety of patient's particular situation, it may be more prudent to continue to pursue issues of palliative care.  Dr. Retta Diones recommended acetic acid irrigation  8. Dysphagia.  Comment: Of note the family has refused hospice services and palliative care consult in the past. They reversed his DNR during recent admission and continue to wish patient to be FULL CODE.   9Constipation: will add schedule miralax, will check KUB, will give enema.     LOS: 12 days   Guerry Covington M.D.  [facility] 05/02/2011, 12:15 PM

## 2011-05-03 LAB — CBC
HCT: 35.6 % — ABNORMAL LOW (ref 39.0–52.0)
Hemoglobin: 11.9 g/dL — ABNORMAL LOW (ref 13.0–17.0)
RBC: 4.3 MIL/uL (ref 4.22–5.81)
WBC: 10.7 10*3/uL — ABNORMAL HIGH (ref 4.0–10.5)

## 2011-05-03 LAB — GLUCOSE, CAPILLARY
Glucose-Capillary: 107 mg/dL — ABNORMAL HIGH (ref 70–99)
Glucose-Capillary: 123 mg/dL — ABNORMAL HIGH (ref 70–99)
Glucose-Capillary: 81 mg/dL (ref 70–99)

## 2011-05-03 LAB — URINE CULTURE: Colony Count: 60000

## 2011-05-03 LAB — BASIC METABOLIC PANEL
CO2: 24 mEq/L (ref 19–32)
Chloride: 100 mEq/L (ref 96–112)
GFR calc non Af Amer: >90 mL/min (ref >90)
Glucose, Bld: 100 mg/dL — ABNORMAL HIGH (ref 70–99)
Potassium: 4.1 mEq/L (ref 3.5–5.1)
Sodium: 137 mEq/L (ref 135–145)

## 2011-05-03 MED ORDER — METOCLOPRAMIDE HCL 5 MG/5ML PO SOLN: 10.0000 mg | Freq: Four times a day (QID) | ORAL | Status: DC

## 2011-05-03 MED ORDER — FREE WATER: 150.0000 mL | Freq: Every day | Status: DC

## 2011-05-03 MED ORDER — CIPROFLOXACIN HCL 500 MG PO TABS: 500.0000 mg | ORAL_TABLET | Freq: Two times a day (BID) | ORAL | Status: AC

## 2011-05-03 MED ORDER — FLUCONAZOLE 100 MG PO TABS: 100.0000 mg | ORAL_TABLET | Freq: Every day | ORAL | Status: AC

## 2011-05-03 MED ORDER — POLYETHYLENE GLYCOL 3350 17 G PO PACK: 17.0000 g | PACK | Freq: Every day | ORAL | Status: AC

## 2011-05-03 MED ORDER — ACETAMINOPHEN 160 MG/5ML PO SOLN: 650.0000 mg | Freq: Four times a day (QID) | ORAL | Status: AC | PRN

## 2011-05-03 MED ORDER — DOCUSATE SODIUM 50 MG/5ML PO LIQD: 100.0000 mg | Freq: Two times a day (BID) | ORAL | Status: AC

## 2011-05-03 NOTE — Discharge Summary (Addendum)
Admit date: 04/20/2011 Discharge date: 05/03/2011  Primary Care Physician:  Dorrene German, MD, MD   Discharge Diagnoses:         Pseudomona UTI. .  Multiple sclerosis exacerbation   .  Asthma   .  Weakness generalized   .  Encephalopathy   .  Hematuria   .  Quadriparesis (muscle weakness)   .  Enterococcus UTI   .  Malnutrition   .  Feeding difficulties   .  UTI (lower urinary tract infection)   .  Sepsis   .  Multiple sclerosis   .  FTT (failure to thrive) in adult   .  Protein calorie malnutrition   .  Sacral decubitus ulcer, stage II      DISCHARGE MEDICATION: Medication List  As of 05/03/2011  9:46 AM   STOP taking these medications         HYDROcodone-acetaminophen 7.5-500 MG/15ML solution      ibuprofen 200 MG tablet      nitrofurantoin 100 MG capsule         TAKE these medications         acetaminophen 160 MG/5ML solution   Commonly known as: TYLENOL   Place 20.3 mLs (650 mg total) into feeding tube every 6 (six) hours as needed.      baclofen 20 MG tablet   Commonly known as: LIORESAL   Take 10 mg by mouth 3 (three) times daily.      BETASERON 0.3 MG injection   Generic drug: interferon beta-1b   Inject 0.3 mg into the skin every other day. Next dose due 04/20/2011      cholecalciferol 1000 UNITS tablet   Commonly known as: VITAMIN D   Take 1,000 Units by mouth 2 (two) times daily.      ciprofloxacin 500 MG tablet   Commonly known as: CIPRO   Place 1 tablet (500 mg total) into feeding tube 2 (two) times daily.      docusate 50 MG/5ML liquid   Commonly known as: COLACE   Place 10 mLs (100 mg total) into feeding tube 2 (two) times daily.      feeding supplement (OSMOLITE 1.5 CAL) Liqd   Place 1,000 mLs into feeding tube continuous.      fluconazole 100 MG tablet   Commonly known as: DIFLUCAN   Place 1 tablet (100 mg total) into feeding tube daily.      free water Soln   Place 150 mLs into feeding tube 6 (six) times daily.      Garlic 1000  MG Caps   Take 1,478 mg by mouth daily.      metoCLOPramide 5 MG/5ML solution   Commonly known as: REGLAN   Place 10 mLs (10 mg total) into feeding tube every 6 (six) hours.      omega-3 acid ethyl esters 1 G capsule   Commonly known as: LOVAZA   Take 1 g by mouth daily.      polyethylene glycol packet   Commonly known as: MIRALAX / GLYCOLAX   Take 17 g by mouth daily.      senna 8.6 MG Tabs   Commonly known as: SENOKOT   Take 2 tablets by mouth daily.              Consults: Treatment Team:  Marcine Matar, MD   SIGNIFICANT DIAGNOSTIC STUDIES:  Dg Abd 1 View  05/02/2011  *RADIOLOGY REPORT*  Clinical Data: 36 year old male with pneumoperitoneum. Constipation.  ABDOMEN -  1 VIEW  Comparison:   04/24/2011 and earlier.  Findings: Two supine views of the abdomen pelvis. Nonobstructed bowel gas pattern.Percutaneous enteric tube in the epigastric region.  No definite pneumoperitoneum, but a fairly large volume of pneumoperitoneum is not evident on the previous supine views (just the decubitus views). Stable visualized osseous structures.    IMPRESSION: 1.  No evidence of persistent pneumoperitoneum on the supine views. A repeat left side down lateral decubitus view would be more sensitive. 2. Nonobstructed bowel gas pattern.  Original Report Authenticated By: Harley Hallmark, M.D.   Dg Chest Port 1 View  04/20/2011  *RADIOLOGY REPORT*  Clinical Data: Fever.  Percutaneous gastrostomy tube placement several days ago.  PORTABLE CHEST - 1 VIEW  Comparison: 04/10/2011  Findings: A moderate amount of free intraperitoneal air is seen in the right upper quadrant. This is likely due to recent percutaneous gastrostomy tube placement.  No evidence of pneumothorax or pleural effusion.  Low lung volumes are seen however both lungs are clear.  Heart size is normal.  IMPRESSION:  1.  No active cardiopulmonary disease. 2.  Pneumoperitoneum, likely due to given history of recent percutaneous gastrostomy  tube placement.  Critical Value/emergent results were called by telephone at the time of interpretation on 04/20/2011  at 2145 hours  to  the patient's floor nurse [name] on 3100, who verbally acknowledged these results. .  Original Report Authenticated By: Danae Orleans, M.D.   Dg Chest Port 1 View  04/10/2011  *RADIOLOGY REPORT*  Clinical Data: Altered mental status, tachycardia, history of dysphagia  PORTABLE CHEST - 1 VIEW  Comparison: Portable exam 1707 hours compared to 03/23/2011  Findings: Normal heart size, mediastinal contours, and pulmonary vascularity. Minimal peribronchial thickening, chronic. No infiltrate, pleural effusion or pneumothorax. Bones unremarkable.  IMPRESSION: No acute abnormalities.  Original Report Authenticated By: Lollie Marrow, M.D.   Dg Abd 2 Views  04/24/2011  *RADIOLOGY REPORT*  Clinical Data: Evaluate pneumoperitoneum.  Abdominal pain.  ABDOMEN - 2 VIEW  Comparison: 04/21/2011.  Findings: There is a persistent pneumoperitoneum located superior to the right lobe the liver on the decubitus view.  This has mildly decreased in size.  The bowel gas pattern is normal.  There is a feeding gastrostomy tube present.  IMPRESSION: Pneumoperitoneum which has mildly decreased in size.  Normal bowel gas pattern.  Original Report Authenticated By: Rolla Plate, M.D.   Dg Abd 2 Views  04/21/2011  *RADIOLOGY REPORT*  Clinical Data: Evaluate pneumoperitoneum  ABDOMEN - 2 VIEW  Comparison: Supine abdominal radiograph dated 04/21/2011.  Semi- erect chest radiograph dated 04/20/2011.  Findings: Gastrostomy tube.  Overlying skin staples.  Nonobstructive bowel gas pattern.  Pneumoperitoneum on the decubitus radiograph, although not expected given the history of recent gastrostomy placement.  Given differences in technique, the amount cannot be directly compared to the prior radiographs.  Visualized osseous structures are within normal limits.  IMPRESSION: Pneumoperitoneum on the decubitus  radiograph, not unexpected given the history of recent gastrostomy placement.  Follow-up radiographs (performed with a consistent technique) could be performed to confirm that the amount of free air is decreasing.  If the patient develops symptoms of abdominal pain, CT abdomen/pelvis should be considered.  These findings were discussed in person with the referring physician at the time of dictation.  Original Report Authenticated By: Charline Bills, M.D.   Dg Abd Portable 1v  04/21/2011  *RADIOLOGY REPORT*  Clinical Data: Recent gastrostomy tube placement  PORTABLE ABDOMEN - 1 VIEW  Comparison: Chest  film 04/12 1013  Findings: There are skin staples over the midabdomen.  Percutaneous gastrostomy tube is in the expected location of the stomach. Intraperitoneal free air is difficult to evaluate on this supine exam.  IMPRESSION: No evidence of  complication following percutaneous gastrostomy tube placement.  Recommend follow-up acute abdominal series to further assess free air seen on chest radiograph.  Original Report Authenticated By: Genevive Bi, M.D.     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 Patient has chronic dysphagia, and aspiration. In the recent past, he had been evaluated by IR and GI for peg tube placement and given his complex anatomy, was recommended surgery for PEG. Gastrostomy tube was placed surgically on 04/16/11, by Dr Violeta Gelinas. Patient was discharged in satisfactery condition. Family is not present in the ED at this time, and at baseline, patient is nonverbal, nodding or shaking his head, and/or blinking once for no and twice for yes. History is gleaned mainly from ED PA, and patient nodded in  affirmative, as i repeated the information I had. It appears the he became quite sweaty and ran a temperature on 04/19/11, after getting home. Today, he has become worse, and developed a rapid heart rate. He was therefore, brought to the ED  Hospital Course:  1.UTI (lower urinary tract infection): Patient has a positive urinary sediment, with marked pyuria, consistent with a UTI. He has just completed a 7-day course of Diflucan for funguria, during his recent hospitalization. ED PA gave a single dose of iv Rocephin in the ED, but given his history of recurrences, chronic Foley catheter and prophylactic Macrobid, this is a complicated UTI, and a resistant organism may be responsible. He was placed on Vancomycin/Primaxin, with satisfactory clinical response. . Urine culture grew Pseudomonas Aeruginosa, most sensitive to Imipenem and Ciprofloxacin. We have switched patient to Ciprofloxacin monotherapy from 04/24/11. Vancomycin has been discontinued. Total planned course of antibiotics, will be 14 days counting from 4-16. Patient has remain afebrile, WBC decreasing. I will prescribe 5 days empirically diflucan due to recent yeast infection. Please make sure patient follow with the urologist. Please consider restart Macrobid.   2. Mild Sepsis on admission: Patient appeared to have SIRS, and possibly even early sepsis on presentation. He had a leukocytosis, tachycardia and borderline low BP and was reportedly, feverish at home. CXR showed no acute disease. Lactic acid was only 1.3 and procalcitonin is <0.10, effectively ruling out severe sepsis.Blood cultures negative x 2. Urine cx with pseudomonas. Vitals stable.  3. Multiple sclerosis: This is advanced MS, and patient is under the care of Dr Lesia Sago, neurologist, and is on Betaseron, which we have continued. Family is requesting Fingolimod (Gilenya) - Dr. Lavera Guise spoke with Dr. Anne Hahn and he will arrange as outpatient . Patient's zoster atibodies are elevated at  4.60 for varicella.  EKG - OK  Eye exam - at SNF  4. FTT (failure to thrive) in adult/Protein calorie malnutrition: This is occasioned by chronic dysphagia, due to multiple sclerosis, and is being addressed with artificial nutrition, via PEG tube.  5. Sacral decubitus ulcer, stage II: Patient does have a healing stage 2 sacral decubitus, which appears well granulated and un-infected. WOC consult recommendations have been obtained, and he has appropriate mattress overlay.  6.  Pneumoperitoneum. This was an incidental finding on CXR, and was not seen on single view AXR. It is likely to be a legacy of patient's surgical PEG placement on 04/16/11. Have discussed with radiologist, and an acute abdominal series has been recommended, to evaluate for progression. Patient has no abdominal tenderness, and bowel sounds are heard. Dr Brien Few has discussed with Dr Violeta Gelinas, and he has assured him that following such a procedure, a pneumoperitoneum is anticipated for 7-10 days. Repeat KUB show no evidence of pneumoperitoneum on supine view.   7. Bladder Calculi: Patient was found to have multiple bladder calculi on Pelvic CT scan of 03/12/11. This, in addition to neurogenic bladder, is obviously yet another predisposition to UTIs. Urology consultation was provided by Dr Isabel Caprice, and he has opined that specific/invasive management of patient's calculi, will prove technically very difficult. He feels that given the entirety of patient's particular situation, it may be more prudent to continue to pursue issues of palliative care.  Dr. Retta Diones recommended acetic acid irrigation for 7 days. Needs to follow up with urology.  8. Dysphagia.  Comment: Of note the family has refused hospice services and palliative care consult in the past. They reversed his DNR during recent admission and continue to wish patient to be FULL CODE.  9Constipation:  check KUB 4-24 was  negative for obstruction. Patient had a bowel movement. Continue  with bowel regimen.     Disposition and Follow-up:  Discharge Orders    Future Orders Please Complete By Expires   Increase activity slowly        Follow-up Information    Follow up with Dorrene German, MD .          DISCHARGE EXAM:  General: Alert, awake,, in no acute distress.  HEENT: No bruits, no goiter.  Heart: Regular rate and rhythm, without murmurs, rubs, gallops.  Lungs: CTA, bilateral air movement.  Abdomen: Soft, nontender, nondistended, positive bowel sounds.  Neuro: contracture extremities. Eyes open, following some commands, open mouth to command.    Blood pressure 100/68, pulse 99, temperature 97.9 F (36.6 C), temperature source Oral, resp. rate 16, height 6\' 3"  (1.905 m), weight 75.297 kg (166 lb), SpO2 98.00%.   Basename 05/03/11 0648 05/02/11 0535  NA 137 137  K 4.1 4.0  CL 100 101  CO2 24 23  GLUCOSE 100* 100*  BUN 16 15  CREATININE 0.78 0.83  CALCIUM 9.4 9.6  MG -- --  PHOS -- --    Basename 05/03/11 0648 05/02/11 0535  WBC 10.7* 11.9*  NEUTROABS -- --  HGB 11.9* 12.1*  HCT 35.6* 36.0*  MCV 82.8 83.1  PLT 491* 453*    Signed: Amandajo Gonder M.D. 05/03/2011, 9:46 AM

## 2011-05-03 NOTE — Progress Notes (Signed)
Utilization Review Completed.Terry Palmer T4/25/2013   

## 2011-05-03 NOTE — Progress Notes (Signed)
Dc instructions provided to wife. Wife verbalized understanding. Pt under no s/s distress.

## 2011-05-03 NOTE — Progress Notes (Signed)
05/03/2011 Milana Kidney DPT PAGER: [phone] OFFICE: [phone]

## 2011-05-03 NOTE — Progress Notes (Signed)
Clinical Social Work   Per MD, patient is ready to dc. CSW faxed dc summary to SNF [facility]) who is agreeable to admission. CSW prepared dc packet. CSW met with wife at bedside and informed her of dc to SNF. Wife agreeable to dc as well. CSW informed RN who asked for PTAR pick up to be around 1200. CSW coordinated transportation via [facility]. CSW is signing off.  [place], [place] [phone]

## 2011-05-03 NOTE — Progress Notes (Signed)
OT / PT Discharge Note  Patient is being discharged from OT/ PT services secondary to:    Pt total (A) for all ADLs and bed mobility PTA   Pt bed bound at baseline   Pt is end stage MS  Please see latest Therapy Progress Note for current level of functioning and progress toward goals.  Progress and discharge plan and discussed with patient/caregiver and they    Pt nonverbal    Patient unable to participate in discharge planning   RN Selena Batten) educated on PROM exercise program for contracture management.   Handouts provided to staff   Terry Palmer   OTR/L Pager: [phone] Office: [phone] .

## 2011-05-27 ENCOUNTER — Inpatient Hospital Stay (HOSPITAL_COMMUNITY)
Admission: EM | Admit: 2011-05-27 | Discharge: 2011-05-31 | DRG: 871 | Disposition: A | Discharge status: Skilled Nursing Facility | Payer: Medicare HMO | Attending: Internal Medicine | Admitting: Internal Medicine

## 2011-05-27 ENCOUNTER — Emergency Department (HOSPITAL_COMMUNITY): Payer: Medicare HMO

## 2011-05-27 ENCOUNTER — Encounter (HOSPITAL_COMMUNITY): Payer: Self-pay | Admitting: *Deleted

## 2011-05-27 DIAGNOSIS — G35 Multiple sclerosis: Secondary | ICD-10-CM

## 2011-05-27 DIAGNOSIS — M25529 Pain in unspecified elbow: Secondary | ICD-10-CM | POA: Diagnosis present

## 2011-05-27 DIAGNOSIS — E43 Unspecified severe protein-calorie malnutrition: Secondary | ICD-10-CM | POA: Diagnosis present

## 2011-05-27 DIAGNOSIS — G8250 Quadriplegia, unspecified: Secondary | ICD-10-CM

## 2011-05-27 DIAGNOSIS — N3000 Acute cystitis without hematuria: Secondary | ICD-10-CM

## 2011-05-27 DIAGNOSIS — L8992 Pressure ulcer of unspecified site, stage 2: Secondary | ICD-10-CM | POA: Diagnosis present

## 2011-05-27 DIAGNOSIS — B952 Enterococcus as the cause of diseases classified elsewhere: Secondary | ICD-10-CM

## 2011-05-27 DIAGNOSIS — R627 Adult failure to thrive: Secondary | ICD-10-CM

## 2011-05-27 DIAGNOSIS — D649 Anemia, unspecified: Secondary | ICD-10-CM | POA: Diagnosis present

## 2011-05-27 DIAGNOSIS — L89109 Pressure ulcer of unspecified part of back, unspecified stage: Secondary | ICD-10-CM | POA: Diagnosis present

## 2011-05-27 DIAGNOSIS — N210 Calculus in bladder: Secondary | ICD-10-CM | POA: Diagnosis present

## 2011-05-27 DIAGNOSIS — R531 Weakness: Secondary | ICD-10-CM

## 2011-05-27 DIAGNOSIS — R1310 Dysphagia, unspecified: Secondary | ICD-10-CM | POA: Diagnosis present

## 2011-05-27 DIAGNOSIS — E46 Unspecified protein-calorie malnutrition: Secondary | ICD-10-CM

## 2011-05-27 DIAGNOSIS — L89152 Pressure ulcer of sacral region, stage 2: Secondary | ICD-10-CM | POA: Diagnosis present

## 2011-05-27 DIAGNOSIS — A419 Sepsis, unspecified organism: Principal | ICD-10-CM | POA: Diagnosis present

## 2011-05-27 DIAGNOSIS — G9340 Encephalopathy, unspecified: Secondary | ICD-10-CM

## 2011-05-27 DIAGNOSIS — R6510 Systemic inflammatory response syndrome (SIRS) of non-infectious origin without acute organ dysfunction: Secondary | ICD-10-CM

## 2011-05-27 DIAGNOSIS — Z931 Gastrostomy status: Secondary | ICD-10-CM

## 2011-05-27 DIAGNOSIS — IMO0001 Reserved for inherently not codable concepts without codable children: Secondary | ICD-10-CM | POA: Diagnosis present

## 2011-05-27 DIAGNOSIS — N319 Neuromuscular dysfunction of bladder, unspecified: Secondary | ICD-10-CM | POA: Diagnosis present

## 2011-05-27 DIAGNOSIS — N390 Urinary tract infection, site not specified: Secondary | ICD-10-CM

## 2011-05-27 DIAGNOSIS — R633 Feeding difficulties: Secondary | ICD-10-CM

## 2011-05-27 DIAGNOSIS — Z79899 Other long term (current) drug therapy: Secondary | ICD-10-CM

## 2011-05-27 DIAGNOSIS — Z87891 Personal history of nicotine dependence: Secondary | ICD-10-CM

## 2011-05-27 HISTORY — DX: Anemia, unspecified: D64.9

## 2011-05-27 HISTORY — DX: Calculus in bladder: N21.0

## 2011-05-27 HISTORY — DX: Neuromuscular dysfunction of bladder, unspecified: N31.9

## 2011-05-27 HISTORY — DX: Shortness of breath: R06.02

## 2011-05-27 HISTORY — DX: Acute upper respiratory infection, unspecified: J06.9

## 2011-05-27 HISTORY — DX: Urinary tract infection, site not specified: N39.0

## 2011-05-27 LAB — COMPREHENSIVE METABOLIC PANEL
ALT: 59 U/L — ABNORMAL HIGH (ref 0–53)
Albumin: 3.9 g/dL (ref 3.5–5.2)
Alkaline Phosphatase: 110 U/L (ref 39–117)
Calcium: 10 mg/dL (ref 8.4–10.5)
Potassium: 4.1 mEq/L (ref 3.5–5.1)
Sodium: 142 mEq/L (ref 135–145)
Total Protein: 8.1 g/dL (ref 6.0–8.3)

## 2011-05-27 LAB — DIFFERENTIAL
Basophils Relative: 0 % (ref 0–1)
Eosinophils Absolute: 0.1 10*3/uL (ref 0.0–0.7)
Eosinophils Relative: 1 % (ref 0–5)
Neutrophils Relative %: 80 % — ABNORMAL HIGH (ref 43–77)

## 2011-05-27 LAB — CBC
MCH: 27.9 pg (ref 26.0–34.0)
MCHC: 33.8 g/dL (ref 30.0–36.0)
MCV: 82.6 fL (ref 78.0–100.0)
Platelets: 476 10*3/uL — ABNORMAL HIGH (ref 150–400)
Platelets: 397 10*3/uL (ref 150–400)
RDW: 14.5 % (ref 11.5–15.5)
RDW: 14.5 % (ref 11.5–15.5)
WBC: 17.2 10*3/uL — ABNORMAL HIGH (ref 4.0–10.5)

## 2011-05-27 LAB — GLUCOSE, CAPILLARY: Glucose-Capillary: 110 mg/dL — ABNORMAL HIGH (ref 70–99)

## 2011-05-27 LAB — URINE MICROSCOPIC-ADD ON

## 2011-05-27 LAB — URINALYSIS, ROUTINE W REFLEX MICROSCOPIC
Glucose, UA: NEGATIVE mg/dL
Protein, ur: NEGATIVE mg/dL
pH: 5.5 (ref 5.0–8.0)

## 2011-05-27 LAB — PROCALCITONIN: Procalcitonin: <0.1 ng/mL

## 2011-05-27 LAB — LACTIC ACID, PLASMA: Lactic Acid, Venous: 2.6 mmol/L — ABNORMAL HIGH (ref 0.5–2.2)

## 2011-05-27 LAB — CREATININE, SERUM
GFR calc Af Amer: >90 mL/min (ref >90)
GFR calc non Af Amer: >90 mL/min (ref >90)

## 2011-05-27 MED ORDER — SODIUM CHLORIDE 0.9 % IV BOLUS (SEPSIS)
1000.0000 mL | Freq: Once | INTRAVENOUS | Status: AC
  Administered 2011-05-27: 1000 mL via INTRAVENOUS

## 2011-05-27 MED ORDER — PIPERACILLIN-TAZOBACTAM 3.375 G IVPB
3.3750 g | Freq: Once | INTRAVENOUS | Status: AC
  Administered 2011-05-27: 3.375 g via INTRAVENOUS
  Filled 2011-05-27: qty 50

## 2011-05-27 MED ORDER — METOCLOPRAMIDE HCL 5 MG/5ML PO SOLN
10.0000 mg | Freq: Four times a day (QID) | ORAL | Status: DC
  Administered 2011-05-27 – 2011-05-31 (×17): 10 mg
  Filled 2011-05-27 (×20): qty 10

## 2011-05-27 MED ORDER — INTERFERON BETA-1B 0.3 MG ~~LOC~~ SOLR
0.3000 mg | SUBCUTANEOUS | Status: DC
  Administered 2011-05-27 – 2011-05-29 (×2): 0.3 mg via SUBCUTANEOUS
  Filled 2011-05-27 (×2): qty 0.3

## 2011-05-27 MED ORDER — CIPROFLOXACIN IN D5W 400 MG/200ML IV SOLN
400.0000 mg | Freq: Two times a day (BID) | INTRAVENOUS | Status: DC
  Administered 2011-05-27 – 2011-05-28 (×2): 400 mg via INTRAVENOUS
  Filled 2011-05-27 (×3): qty 200

## 2011-05-27 MED ORDER — POLYETHYLENE GLYCOL 3350 17 G PO PACK
17.0000 g | PACK | Freq: Every day | ORAL | Status: DC
  Administered 2011-05-27 – 2011-05-31 (×4): 17 g
  Filled 2011-05-27 (×5): qty 1

## 2011-05-27 MED ORDER — DOCUSATE SODIUM 50 MG/5ML PO LIQD
100.0000 mg | Freq: Two times a day (BID) | ORAL | Status: DC
  Administered 2011-05-27 – 2011-05-31 (×8): 100 mg
  Filled 2011-05-27 (×10): qty 10

## 2011-05-27 MED ORDER — SODIUM CHLORIDE 0.9 % IJ SOLN
3.0000 mL | Freq: Two times a day (BID) | INTRAMUSCULAR | Status: DC
  Administered 2011-05-27 – 2011-05-31 (×4): 3 mL via INTRAVENOUS

## 2011-05-27 MED ORDER — OSMOLITE 1.2 CAL PO LIQD
1000.0000 mL | ORAL | Status: DC
  Administered 2011-05-27: 1000 mL
  Filled 2011-05-27 (×2): qty 1000

## 2011-05-27 MED ORDER — ENOXAPARIN SODIUM 40 MG/0.4ML ~~LOC~~ SOLN
40.0000 mg | SUBCUTANEOUS | Status: DC
  Administered 2011-05-27 – 2011-05-31 (×5): 40 mg via SUBCUTANEOUS
  Filled 2011-05-27 (×5): qty 0.4

## 2011-05-27 MED ORDER — BACLOFEN 10 MG PO TABS
10.0000 mg | ORAL_TABLET | Freq: Four times a day (QID) | ORAL | Status: DC
  Administered 2011-05-27 – 2011-05-31 (×16): 10 mg
  Filled 2011-05-27 (×19): qty 1

## 2011-05-27 MED ORDER — ACETAMINOPHEN 160 MG/5ML PO SOLN
650.0000 mg | Freq: Four times a day (QID) | ORAL | Status: DC | PRN
  Filled 2011-05-27: qty 20.3

## 2011-05-27 MED ORDER — SODIUM CHLORIDE 0.9 % IV SOLN
INTRAVENOUS | Status: DC
  Administered 2011-05-27 – 2011-05-28 (×2): via INTRAVENOUS

## 2011-05-27 MED ORDER — TIZANIDINE HCL 4 MG PO TABS
4.0000 mg | ORAL_TABLET | Freq: Four times a day (QID) | ORAL | Status: DC
  Administered 2011-05-27 – 2011-05-31 (×18): 4 mg
  Filled 2011-05-27 (×20): qty 1

## 2011-05-27 MED ORDER — FLUCONAZOLE IN SODIUM CHLORIDE 200-0.9 MG/100ML-% IV SOLN
200.0000 mg | INTRAVENOUS | Status: DC
  Administered 2011-05-27 – 2011-05-31 (×5): 200 mg via INTRAVENOUS
  Filled 2011-05-27 (×6): qty 100

## 2011-05-27 MED ORDER — FREE WATER
150.0000 mL | Freq: Every day | Status: DC
  Administered 2011-05-27 – 2011-05-31 (×26): 150 mL

## 2011-05-27 MED ORDER — PIPERACILLIN-TAZOBACTAM 3.375 G IVPB
3.3750 g | Freq: Three times a day (TID) | INTRAVENOUS | Status: DC
  Administered 2011-05-27 – 2011-05-31 (×13): 3.375 g via INTRAVENOUS
  Filled 2011-05-27 (×15): qty 50

## 2011-05-27 MED ORDER — BUPROPION HCL 100 MG PO TABS
100.0000 mg | ORAL_TABLET | Freq: Three times a day (TID) | ORAL | Status: DC
  Administered 2011-05-27 – 2011-05-31 (×13): 100 mg
  Filled 2011-05-27 (×14): qty 1

## 2011-05-27 MED ORDER — MORPHINE SULFATE 2 MG/ML IJ SOLN
2.0000 mg | INTRAMUSCULAR | Status: DC | PRN
  Administered 2011-05-28 – 2011-05-30 (×5): 2 mg via INTRAVENOUS
  Filled 2011-05-27 (×5): qty 1

## 2011-05-27 MED ORDER — ACETAMINOPHEN 325 MG PO TABS
650.0000 mg | ORAL_TABLET | Freq: Once | ORAL | Status: AC
  Administered 2011-05-27: 650 mg
  Filled 2011-05-27: qty 2

## 2011-05-27 MED ORDER — ONDANSETRON HCL 4 MG/2ML IJ SOLN: 4.0000 mg | Freq: Four times a day (QID) | INTRAMUSCULAR | Status: DC | PRN

## 2011-05-27 MED ORDER — SODIUM CHLORIDE 0.9 % IV SOLN
Freq: Once | INTRAVENOUS | Status: AC
  Administered 2011-05-27: 13:00:00 via INTRAVENOUS

## 2011-05-27 MED ORDER — SODIUM CHLORIDE 0.9 % IV BOLUS (SEPSIS)
500.0000 mL | Freq: Once | INTRAVENOUS | Status: AC
  Administered 2011-05-27: 500 mL via INTRAVENOUS

## 2011-05-27 MED ORDER — ONDANSETRON HCL 4 MG PO TABS: 4.0000 mg | ORAL_TABLET | Freq: Four times a day (QID) | ORAL | Status: DC | PRN

## 2011-05-27 NOTE — Progress Notes (Signed)
ANTIBIOTIC CONSULT NOTE - INITIAL  Pharmacy Consult for Zosyn/Diflucan/Cipro Indication: presumed UTI  No Known Allergies  Patient Measurements: Height: 6\' 2"  (188 cm) (stated by family) Weight: 142 lb 3.2 oz (64.5 kg) (bed scale) IBW/kg (Calculated) : 82.2  Adjusted Body Weight:   Vital Signs: Temp: 97.8 F (36.6 C) (05/19 1248) Temp src: Axillary (05/19 1248) BP: 129/64 mmHg (05/19 1248) Pulse Rate: 117  (05/19 1248) Intake/Output from previous day:   Intake/Output from this shift: Total I/O In: 2142.5 [I.V.:1552.5; Other:40; IV Piggyback:550] Out: 450 [Urine:450]  Labs:  Basename 05/27/11 1257 05/27/11 0700  WBC 17.2* 17.4*  HGB 12.1* 13.0  PLT 397 476*  LABCREA -- --  CREATININE 0.77 0.78   Estimated Creatinine Clearance: 115.3 ml/min (by C-G formula based on Cr of 0.77). No results found for this basename: VANCOTROUGH:2,VANCOPEAK:2,VANCORANDOM:2,GENTTROUGH:2,GENTPEAK:2,GENTRANDOM:2,TOBRATROUGH:2,TOBRAPEAK:2,TOBRARND:2,AMIKACINPEAK:2,AMIKACINTROU:2,AMIKACIN:2, in the last 72 hours   Microbiology: Recent Results (from the past 720 hour(s))  URINE CULTURE     Status: Normal   Collection Time   05/02/11  6:52 AM      Component Value Range Status Comment   Specimen Description URINE, CATHETERIZED   Final    Special Requests PATIENT ON FOLLOWING CIPRO   Final    Culture  Setup Time [id number]   Final    Colony Count 60,000 COLONIES/ML   Final    Culture YEAST   Final    Report Status 05/03/2011 FINAL   Final     Medical History: Past Medical History  Diagnosis Date  . MS (multiple sclerosis)   . 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 Childhood asthma   . Depression   . Neuromuscular disorder     Quadraperesis  . Recurrent UTI   . Dysphagia   . Bladder calculi   . Neurogenic bladder   . Shortness of breath   . Recurrent upper respiratory infection (URI)     Medications:  CTX 1 gram IV x 1 5/19 Finished course (unknown duration) macrobid  5/18  Assessment: 35yoM presents w/ fever, chills, diaphoresis, AMS, difficulty breathing. Recent hospitalization 04/2011 for pseudomonas UTI treated w/ vanc/primaxin-->cipro along with many other previous UTI infections with yeast, enterobacter.  NKDAs.   Pharmacy asked to manage Diflucan/Zosyn/Cipro for presumed UTI with early sepsis. Received one dose CTX as outpt 5/19, finished course macrobid 5/18 (unclear duration). In ED: Tm100.1, CBC 17.4, HR 132. U/A and CXR negative. PCT <0.1. Urine, blood cxs pending. Renal fxn stable.    Plan:  Zosyn 3.375g IV q 8 hours Diflucan 200mg  IV q 24h Cipro 400mg  IV q 12 hours. F/u renal fxn, clinical course, cultures, T, WBC, ect   Terry Palmer E 05/27/2011,2:01 PM

## 2011-05-27 NOTE — ED Notes (Addendum)
Pt arrived via [facility] from [facility] with  c/o fever, Altered LOC, skin cool pale and  diaphoresis. Pt hx End stage MS. EMS notes yellowish tan discharge around PEG tude

## 2011-05-27 NOTE — ED Notes (Signed)
Pt is still having frequent diaphoresis.  Pt eyes open and moans.  Indwelling urinary catheter with no urine for sample.  Tried repositioning and have clamped foley to try to get sample.  Lung sounds clear.  Temp 100.1 rectal reported to dr. Judd Lien and giving another NS 500 bolus.  Pt has peg tube with some slight drainage around site.  Bowel sounds hypoactive.  Will continue to monitor

## 2011-05-27 NOTE — ED Notes (Signed)
Pt given tylenol via tube.  No residual and flushed well

## 2011-05-27 NOTE — ED Notes (Signed)
Pt is tachycardic, tachypnea, diaphoretic, cool clammy skin. Lung sounds clear. Pt non verbal.

## 2011-05-27 NOTE — ED Notes (Addendum)
Pt chronic hx of UTI per staff at [facility]. NPO. All PO med through PEG tube

## 2011-05-27 NOTE — ED Notes (Signed)
Pt from [facility] sent to ED for fever chills and diaphoretic with decreased LOC Hx MS end stage

## 2011-05-27 NOTE — ED Provider Notes (Signed)
Pt with sweats, fever, tachycardia, has elevated lactic acid, procalciton is normal, likely early uro-bacteremia or sepsis.  Foley will be replaced.  Cultures obtained.  UA shows 7-10 WBC's.  Pt is already on rocephin according to [facility].  Will give Zosyn for broader coverage.  Will consult hosptialist for admission.  Pt after IVF has improvement in HR.    CRITICAL CARE Performed by: Lear Ng   Total critical care time: 30 min  Critical care time was exclusive of separately billable procedures and treating other patients.  Critical care was necessary to treat or prevent imminent or life-threatening deterioration.  Critical care was time spent personally by me on the following activities: development of treatment plan with patient and/or surrogate as well as nursing, discussions with consultants, evaluation of patient's response to treatment, examination of patient, obtaining history from patient or surrogate, ordering and performing treatments and interventions, ordering and review of laboratory studies, ordering and review of radiographic studies, pulse oximetry and re-evaluation of patient's condition.      Gavin Pound. Juwana Thoreson, MD 05/27/11 1030

## 2011-05-27 NOTE — ED Notes (Signed)
Still no urine production to draw out a sample

## 2011-05-27 NOTE — ED Notes (Signed)
Per EDP pt switched from NRB mask to [employer] going at 2 L/min

## 2011-05-27 NOTE — H&P (Signed)
History and Physical  Terry Palmer:[id number] DOB: March 04, 1975 DOA: 05/27/2011  Referring physician: Quita Skye, MD PCP: Dorrene German, MD, MD  Neurologist: Marlan Palau, MD  Chief Complaint: fever  HPI:  36 year old man with history of end-stage multiple sclerosis, quadriparesis, neurogenic bladder with chronic indwelling Foley catheter, recurrent urinary tract infections presents to the emergency department from [facility] for fever, chills, diaphoresis, decreased level of consciousness, difficulty breathing. History obtained from father at bedside, emergency department physician and documentation as patient is nonverbal. By [facility] patient was started on Rocephin today as an outpatient, presumably for a UTI although this is not confirmed.  Review the records as summarized as below and is notable for recurrent urinary tract infections and progression of multiple sclerosis. Last documented post status was full code.  In the emergency department was noted to have a temperature 100.1, tachycardia with rate 132. Normotensive. Minimal hypoxia. Complete metabolic panel is unremarkable but CBC was notable for a leukocytosis of 17.4. Lactic acid 2.6. Urinalysis was equivocal. Chest x-ray was negative. Patient was treated with a 1 L bolus of normal saline and Zosyn and referred for admission for presumed UTI with early sepsis.  Chart Review:  04/20/2011-05/03/2011 Hospitalization: Pseudomonas UTI, sepsis, failure to thrive, multiple sclerosis exacerbation. Treated initially with vancomycin/Primaxin and then placed on ciprofloxacin and based on culture data. Presented with diaphoresis, tachycardia and fever. Patient's family refused hospice services in palliative care in the past and patient was a code during this hospitalization.  04/10/2011-04/19/2011 Hospitalization: Yeast UTI, acute encephalopathy, end-stage multiple sclerosis, chronic dysphagia status post G-tube  placement.  03/12/2011-03/26/2011 Hospitalization: Enterococcus UTI, encephalopathy.  01/31/2011-02/05/2011 Hospitalization: UTI.  12/26/2010-12/28/2010 Hospitalization: UTI.  Review of Systems:  As above. Additional items obtained from father bedside, otherwise unobtainable. Negative for bleeding, nausea, vomiting.  Past Medical History  Diagnosis Date  . MS (multiple sclerosis)   . 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 Childhood asthma   . Depression   . Neuromuscular disorder     Quadraperesis  . Recurrent UTI   . Dysphagia   . Bladder calculi   . Neurogenic bladder    Past Surgical History  Procedure Date  . Lumbar puncture 10/12/2002  . Gastrostomy 04/16/2011    Procedure: GASTROSTOMY;  Surgeon: Liz Malady, MD;  Location: [facility] OR;  Service: General;  Laterality: N/A;  Open G-Tube placement   Social History:  reports that he quit smoking about 12 years ago. His smoking use included Cigarettes. He has quit using smokeless tobacco. He reports that he does not drink alcohol. His drug history not on file.  No Known Allergies  Family History  Problem Relation Age of Onset  . Asthma Mother    Prior to Admission medications   Medication Sig Start Date End Date Taking? Authorizing Provider  acetaminophen (TYLENOL) 160 MG/5ML solution 650 mg by PEG Tube route every 6 (six) hours as needed. For moderate pain   Yes Historical Provider, MD  Ascorbic Acid (VITAMIN C PO) 5 mLs by PEG Tube route 2 (two) times daily. Vitamin C 500mg /78ml liquid   Yes Historical Provider, MD  baclofen (LIORESAL) 20 MG tablet 10 mg by PEG Tube route 4 (four) times daily.    Yes Historical Provider, MD  buPROPion (WELLBUTRIN) 100 MG tablet 100 mg by PEG Tube route 3 (three) times daily.   Yes Historical Provider, MD  cefTRIAXone (ROCEPHIN) 1 G injection Inject 1 g into the muscle daily. Starting 05/27/11 for 7 days for UTI  Yes Historical Provider, MD  cholecalciferol (VITAMIN D) 1000 UNITS tablet  Take 1,000 Units by mouth 2 (two) times daily.    Yes Historical Provider, MD  Cranberry 475 MG CAPS 1 capsule by PEG Tube route 2 (two) times daily.   Yes Historical Provider, MD  Cranberry-Vitamin C-Inulin (UTI-STAT) LIQD 30 mLs by PEG Tube route 2 (two) times daily.   Yes Historical Provider, MD  docusate (COLACE) 50 MG/5ML liquid 100 mg by PEG Tube route 2 (two) times daily.   Yes Historical Provider, MD  Garlic 1000 MG CAPS 1,000 mg by PEG Tube route daily.    Yes Historical Provider, MD  interferon beta-1b (BETASERON) 0.3 MG injection Inject 0.3 mg into the skin every other day. Odd days   Yes Historical Provider, MD  metoCLOPramide (REGLAN) 5 MG/5ML solution Place 10 mg into feeding tube every 6 (six) hours. 05/03/11 06/02/11 Yes Belkys A Regalado, MD  Multiple Vitamin (MULITIVITAMIN WITH MINERALS) TABS 1 tablet by PEG Tube route daily.   Yes Historical Provider, MD  nitrofurantoin, macrocrystal-monohydrate, (MACROBID) 100 MG capsule Take 100 mg by mouth 2 (two) times daily. D/C on 5/18   Yes Historical Provider, MD  omega-3 acid ethyl esters (LOVAZA) 1 G capsule Take 1 g by mouth daily.   Yes Historical Provider, MD  polyethylene glycol (MIRALAX / GLYCOLAX) packet 17 g by PEG Tube route daily.   Yes Historical Provider, MD  predniSONE 5 MG/5ML solution 5-35 mg by PEG Tube route daily. Starting 5/18 should end 5/24. 7 day taper, decrease by 5 ml each day   Yes Historical Provider, MD  tiZANidine (ZANAFLEX) 2 MG tablet 4 mg by PEG Tube route every 6 (six) hours.   Yes Historical Provider, MD  zinc sulfate 220 MG capsule 220 mg by PEG Tube route daily.   Yes Historical Provider, MD  Nutritional Supplements (FEEDING SUPPLEMENT, OSMOLITE 1.5 CAL,) LIQD Place 1,000 mLs into feeding tube continuous. 04/19/11   Cordelia Pen, NP  Water For Irrigation, Sterile (FREE WATER) SOLN Place 150 mLs into feeding tube 6 (six) times daily. 05/03/11   Alba Cory, MD   Physical Exam: Filed Vitals:    05/27/11 0540 05/27/11 0543 05/27/11 0738 05/27/11 0930  BP:  163/92 123/83 122/75  Pulse:   132 108  Temp:  98.9 F (37.2 C) 100.1 F (37.8 C) 99.3 F (37.4 C)  TempSrc:  Oral Rectal Core (Comment)  Resp:  20 22 20   SpO2: 98% 100% 100% 98%    General:  Examined in the emergency department. Thin man in no acute distress. Appears calm. Nonverbal and unable to follow commands at baseline except did open mouth to command. Currently normotensive with normal oxygenation and respiratory effort.  Eyes: Pupils are round and appear to be equal. Lids appear unremarkable.  ENT: Appears grossly normal hearing. Lips and tongue appear unremarkable.  Neck: No lymphadenopathy or masses. No thyromegaly.  Cardiovascular: Tachycardic, regular rhythm. No murmur, rub, gallop. No lower extremity edema.  Respiratory: Clear to auscultation bilaterally. No wheezes, rales, rhonchi. Normal respiratory effort.  Abdomen: Thin, soft, nondistended, nontender. Well-healed vertical incision midline. PEG tube in place.  Skin: Appears grossly unremarkable anteriorly. A history of stage II sacral decubitus which was not examined at this point to prevent patient discomfort and worsening spasm of his legs.  Musculoskeletal: Spontaneous spasm of legs during examination. Unable to follow commands. Contractures at the hip and knees.  Psychiatric: Unable to assess.  Neurologic: Appearance consistent with history of advanced/end-stage  multiple sclerosis.  Labs on Admission:  Basic Metabolic Panel:  Lab 05/27/11 8938  NA 142  K 4.1  CL 104  CO2 20  GLUCOSE 97  BUN 17  CREATININE 0.78  CALCIUM 10.0  MG --  PHOS --   Liver Function Tests:  Lab 05/27/11 0700  AST 43*  ALT 59*  ALKPHOS 110  BILITOT 0.3  PROT 8.1  ALBUMIN 3.9   CBC:  Lab 05/27/11 0700  WBC 17.4*  NEUTROABS 13.9*  HGB 13.0  HCT 38.5*  MCV 82.6  PLT 476*   Radiological Exams on Admission: Dg Chest Port 1 View  05/27/2011   *RADIOLOGY REPORT*  Clinical Data: Shortness of breath  PORTABLE CHEST - 1 VIEW  Comparison: 04/20/2011  Findings: Lungs are clear. No pleural effusion or pneumothorax. The cardiomediastinal contours are within normal limits. The visualized bones and soft tissues are without significant appreciable abnormality.  IMPRESSION: No radiographic evidence for acute cardiopulmonary process.  Original Report Authenticated By: Waneta Martins, M.D.   Principal Problem:  *UTI (lower urinary tract infection) Active Problems:  Sepsis  Quadriparesis (muscle weakness)  Multiple sclerosis  Sacral decubitus ulcer, stage II   Assessment/Plan 1. Febrile illness: Presumed UTI with early sepsis. 2. Presumed UTI with early sepsis: IV fluids, broad-spectrum antibiotics given multiple hospitalizations, chronic indwelling Foley catheter and chronic bladder calculi. Follow-up urine culture and blood cultures. Most recent urine cultures were notable for yeast and Pseudomonas sensitive to Cipro and Zosyn. March 2010: Enterococcus sensitive to ampicillin. 3. Neurogenic bladder/bladder calculi: By review of previous urology consultations the patient is not felt to be a candidate for stone extraction or annihilation. 4. End-stage multiple sclerosis  5. Stage II sacral decubitus: Wound care.  Prognosis guarded. Discussed with wife by telephone, all questions answered to her apparent satisfaction. She spontaneously told me that she wants her husband to be full code.  Code Status: Full code  Family Communication: Wife Brandan Robicheaux, [phone] Disposition Plan: Pending further evaluation  Brendia Sacks, MD  [facility] Pager [phone]  If 8PM-8AM, please contact floor/night-coverage at [email or web address], password [facility] 05/27/2011, 10:10 AM

## 2011-05-27 NOTE — ED Provider Notes (Addendum)
History     CSN: [id number]  Arrival date & time 05/27/11  0536   First MD Initiated Contact with Patient 05/27/11 0542      Chief Complaint  Patient presents with  . Fever  . Chills    (Consider location/radiation/quality/duration/timing/severity/associated sxs/prior treatment) HPI Comments: Patient is non-verbal, therefore a Level 5 caveat applies.  Patient has a history of end-stage MS, by [facility] from [facility] where he began with difficulty breathing, diaphoresis acutely this AM.    Patient is a 36 y.o. male presenting with fever.  Fever Primary symptoms of the febrile illness include fever.    Past Medical History  Diagnosis Date  . MS (multiple sclerosis)   . UTI (urinary tract infection)   . 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 Childhood asthma   . Depression   . Neuromuscular disorder     Quadraperesis    Past Surgical History  Procedure Date  . Lumbar puncture 10/12/2002  . Gastrostomy 04/16/2011    Procedure: GASTROSTOMY;  Surgeon: Liz Malady, MD;  Location: [facility] OR;  Service: General;  Laterality: N/A;  Open G-Tube placement    Family History  Problem Relation Age of Onset  . Asthma Mother     History  Substance Use Topics  . Smoking status: Former Smoker    Types: Cigarettes    Quit date: 02/02/1999  . Smokeless tobacco: Former Neurosurgeon  . Alcohol Use: No      Review of Systems  Constitutional: Positive for fever.    Allergies  Review of patient's allergies indicates no known allergies.  Home Medications   Current Outpatient Rx  Name Route Sig Dispense Refill  . BACLOFEN 20 MG PO TABS Oral Take 10 mg by mouth 3 (three) times daily.    Marland Kitchen VITAMIN D 1000 UNITS PO TABS Oral Take 1,000 Units by mouth 2 (two) times daily.     Marland Kitchen GARLIC 1000 MG PO CAPS Oral Take 1,000 mg by mouth daily.      . INTERFERON BETA-1B 0.3 MG Boon SOLR Subcutaneous Inject 0.3 mg into the skin every other day. Next dose due 04/20/2011    . METOCLOPRAMIDE HCL 5 MG/5ML PO  SOLN Per Tube Place 10 mLs (10 mg total) into feeding tube every 6 (six) hours. 120 mL 0  . OSMOLITE 1.5 CAL PO LIQD Per Tube Place 1,000 mLs into feeding tube continuous.    . OMEGA-3-ACID ETHYL ESTERS 1 G PO CAPS Oral Take 1 g by mouth daily.    . SENNA 8.6 MG PO TABS Oral Take 2 tablets by mouth daily.    Marland Kitchen FREE WATER Per Tube Place 150 mLs into feeding tube 6 (six) times daily. 500 mL 0    BP 163/92  Temp(Src) 98.9 F (37.2 C) (Oral)  Resp 20  SpO2 100%  Physical Exam  Nursing note and vitals reviewed. Constitutional: He is oriented to person, place, and time.       The patient appears chronically and acutely ill.    HENT:  Head: Normocephalic and atraumatic.       MM's somewhat dry.  Neck: Normal range of motion. Neck supple.  Cardiovascular: Normal rate and regular rhythm.   No murmur heard. Pulmonary/Chest: Effort normal and breath sounds normal.  Abdominal: Soft. Bowel sounds are normal. He exhibits no distension. There is no tenderness.  Musculoskeletal:       The extremities show muscle wasting.    Lymphadenopathy:    He has no cervical adenopathy.  Neurological: He is alert and oriented to person, place, and time.  Skin: There is pallor.       diaphoretic    ED Course  Procedures (including critical care time)  Labs Reviewed - No data to display No results found.   No diagnosis found.    MDM  The patient arrived febrile and sweating profusely.  The workup reveals a wbc of 17k, raising my suspicion for infection/sepsis.  He has an indwelling foley and I suspect this is likely the culprit.  As of yet, a UA has not been obtained as there has been no urine from the catheter.  The nurse is currently adjusting the catheter in an attempt to obtain the specimen.  At this point, the patient's care will be signed out to Dr. Oletta Lamas at shift change.  He will obtain the results of the tests and determine the final disposition.           Geoffery Lyons, MD 05/27/11  1610  Geoffery Lyons, MD 07/10/11 [phone]

## 2011-05-28 ENCOUNTER — Encounter (HOSPITAL_COMMUNITY): Payer: Self-pay | Admitting: Family Medicine

## 2011-05-28 DIAGNOSIS — N210 Calculus in bladder: Secondary | ICD-10-CM

## 2011-05-28 DIAGNOSIS — G35 Multiple sclerosis: Secondary | ICD-10-CM

## 2011-05-28 DIAGNOSIS — E43 Unspecified severe protein-calorie malnutrition: Secondary | ICD-10-CM | POA: Diagnosis present

## 2011-05-28 DIAGNOSIS — D649 Anemia, unspecified: Secondary | ICD-10-CM

## 2011-05-28 DIAGNOSIS — N3000 Acute cystitis without hematuria: Secondary | ICD-10-CM

## 2011-05-28 DIAGNOSIS — E1165 Type 2 diabetes mellitus with hyperglycemia: Secondary | ICD-10-CM

## 2011-05-28 DIAGNOSIS — N319 Neuromuscular dysfunction of bladder, unspecified: Secondary | ICD-10-CM | POA: Diagnosis present

## 2011-05-28 HISTORY — DX: Anemia, unspecified: D64.9

## 2011-05-28 LAB — CBC
HCT: 31 % — ABNORMAL LOW (ref 39.0–52.0)
MCV: 84.2 fL (ref 78.0–100.0)
Platelets: 350 10*3/uL (ref 150–400)
RBC: 3.68 MIL/uL — ABNORMAL LOW (ref 4.22–5.81)
WBC: 7.7 10*3/uL (ref 4.0–10.5)

## 2011-05-28 LAB — URINE CULTURE
Colony Count: 80000
Culture  Setup Time: 201305191708

## 2011-05-28 LAB — BASIC METABOLIC PANEL
CO2: 23 mEq/L (ref 19–32)
Chloride: 107 mEq/L (ref 96–112)
GFR calc Af Amer: >90 mL/min (ref >90)
Potassium: 3.6 mEq/L (ref 3.5–5.1)
Sodium: 140 mEq/L (ref 135–145)

## 2011-05-28 MED ORDER — OSMOLITE 1.2 CAL PO LIQD
1000.0000 mL | ORAL | Status: DC
  Administered 2011-05-28 – 2011-05-30 (×4): 1000 mL
  Filled 2011-05-28 (×12): qty 1000

## 2011-05-28 MED ORDER — PRO-STAT SUGAR FREE PO LIQD
30.0000 mL | Freq: Every day | ORAL | Status: DC
  Administered 2011-05-28 – 2011-05-31 (×4): 30 mL
  Filled 2011-05-28 (×4): qty 30

## 2011-05-28 NOTE — Progress Notes (Addendum)
INITIAL ADULT NUTRITION ASSESSMENT Date: 05/28/2011   Time: 9:11 AM Reason for Assessment: consult  ASSESSMENT: Male 36 y.o.  Dx: UTI (lower urinary tract infection)  Hx:  Past Medical History  Diagnosis Date  . MS (multiple sclerosis)   . 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 Childhood asthma   . Depression   . Neuromuscular disorder     Quadraperesis  . Recurrent UTI   . Dysphagia   . Bladder calculi   . Neurogenic bladder   . Shortness of breath   . Recurrent upper respiratory infection (URI)     Related Meds:     . sodium chloride   Intravenous Once  . baclofen  10 mg Per Tube QID  . buPROPion  100 mg Per Tube TID  . ciprofloxacin  400 mg Intravenous Q12H  . docusate  100 mg Per Tube BID  . enoxaparin  40 mg Subcutaneous Q24H  . fluconazole (DIFLUCAN) IV  200 mg Intravenous Q24H  . free water  150 mL Per Tube 6 X Daily  . interferon beta-1b  0.3 mg Subcutaneous Q48H  . metoCLOPramide  10 mg Per Tube Q6H  . piperacillin-tazobactam (ZOSYN)  IV  3.375 g Intravenous Once  . piperacillin-tazobactam (ZOSYN)  IV  3.375 g Intravenous Q8H  . polyethylene glycol  17 g Per Tube Daily  . sodium chloride  500 mL Intravenous Once  . sodium chloride  3 mL Intravenous Q12H  . tiZANidine  4 mg Per Tube Q6H     Ht: 6\' 2"  (188 cm) (stated by family)  Wt: 147 lb 11.3 oz (67 kg) (Bed wt.)  Ideal Wt: 86.4 kg % Ideal Wt: 77%  Usual Wt: 184 lbs  Wt Readings from Last 10 Encounters:  05/28/11 147 lb 11.3 oz (67 kg)  04/23/11 166 lb (75.297 kg)  04/18/11 160 lb 15 oz (73 kg)  04/18/11 160 lb 15 oz (73 kg)  03/26/11 186 lb 15.2 oz (84.8 kg)  03/26/11 186 lb 15.2 oz (84.8 kg)  02/01/11 155 lb 13.8 oz (70.7 kg)  12/26/10 166 lb 14.4 oz (75.705 kg)    % Usual Wt: 80%  Body mass index is 18.96 kg/(m^2). Pt is underweight  Food/Nutrition Related Hx: pt with dysphagia and weight loss pta.   Labs:  CMP     Component Value Date/Time   NA 140 05/28/2011 0602   K 3.6  05/28/2011 0602   CL 107 05/28/2011 0602   CO2 23 05/28/2011 0602   GLUCOSE 103* 05/28/2011 0602   BUN 12 05/28/2011 0602   CREATININE 0.85 05/28/2011 0602   CALCIUM 8.8 05/28/2011 0602   PROT 8.1 05/27/2011 0700   ALBUMIN 3.9 05/27/2011 0700   AST 43* 05/27/2011 0700   ALT 59* 05/27/2011 0700   ALKPHOS 110 05/27/2011 0700   BILITOT 0.3 05/27/2011 0700   GFRNONAA >90 05/28/2011 0602   GFRAA >90 05/28/2011 0602     Intake/Output Summary (Last 24 hours) at 05/28/11 0916 Last data filed at 05/28/11 4540  Gross per 24 hour  Intake   4560 ml  Output   1225 ml  Net   3335 ml     Diet Order: NPO  Supplements/Tube Feeding: Osmolite 1.2 @ 40 ml/hr via PEG   IVF:    sodium chloride Last Rate: 75 mL/hr at 05/27/11 1319  feeding supplement (OSMOLITE 1.2 CAL) Last Rate: 1,000 mL (05/27/11 1505)    Estimated Nutritional Needs:   Kcal: 2200-2400 Protein: 113-125 gm  Fluid:  2.2 - 2.4 L  Pt is well known to this RD. Had PEG placed at last admission. Pt has had weight loss since d/c from last admission, 19 lbs since 4/15 (11%) severe weight loss. Per nursing home records pt was receiving Osmolite 1.2 @ 70 ml/hr over 24 hours providing 2016 kcal and 93 gm protein. Pt also with stage 2 pressure ulcer on sacrum.  Pt has been previously diagnosed with severe malnutrition in the context of chronic illness, this continues given continued weight loss.   RD attempted to speak with pt, he was unable to provide any additional information. No family was available to talk with at time of RD visit.  NUTRITION DIAGNOSIS: -Inadequate enteral nutrition infusion (NI-2.3).  Status: Ongoing  RELATED TO: high energy needs with pressure ulcer  AS EVIDENCE BY: weight loss  MONITORING/EVALUATION(Goals): Goal: EN to provide 90-100% of estimated nutrition needs to promote weight maintenance or gain.   Monitor: Weight, TF, I/O's, labs  EDUCATION NEEDS: -No education needs identified at this  time  INTERVENTION: 1. RD to adjust TF: increase Osmolite 1.2 to a goal rate of 75 ml/hr.  2. Add 30 ml Pro-stat once daily via tube  This EN regimen will provide 2260 kcal, 115 gm protein and 1467 ml free water. Additional free water flushes of 150 ml 6 times daily meeting additional hydration needs at this time.  3. RD will continue to follow.   Dietitian [phone]  DOCUMENTATION CODES Per approved criteria  -Severe malnutrition in the context of chronic illness    KOWALSKI, Shekera Beavers MARIE 05/28/2011, 9:11 AM

## 2011-05-28 NOTE — Clinical Social Work Psychosocial (Signed)
     Clinical Social Work Department BRIEF PSYCHOSOCIAL ASSESSMENT 05/28/2011  Patient:  ZAVIYAR, RAHAL     Account Number:  [id number]     Admit date:  05/27/2011  Clinical Social Worker:  Hulan Fray  Date/Time:  05/28/2011 02:00 PM  Referred by:  CSW  Date Referred:  05/28/2011 Referred for  Other - See comment   Other Referral:   admit from facility   Interview type:  Family Other interview type:   Darnelle Bos [phone]    PSYCHOSOCIAL DATA Living Status:  FACILITY Admitted from facility:  [facility] Level of care:  Skilled Nursing Facility Primary support name:  [name] Primary support relationship to patient:  SPOUSE Degree of support available:   supportive    CURRENT CONCERNS Current Concerns  Post-Acute Placement   Other Concerns:    SOCIAL WORK ASSESSMENT / PLAN Clinical Social Worker reviewed chart and saw that patient was from facility. CSW spoke with wife, [name] at bedside and confirmed patient was at [facility]. Per wife, plans are to return at discharge. CSW will complete FL2 for MD's signature and continue to follow for discharge planning needs.   Assessment/plan status:  Psychosocial Support/Ongoing Assessment of Needs Other assessment/ plan:   Information/referral to community resources:    PATIENTS/FAMILYS RESPONSE TO PLAN OF CARE: Wife is agreeable for patient to return back to facility. Wife was appreciative of CSW's assistance.

## 2011-05-28 NOTE — Consult Note (Signed)
WOC consult Note Reason for Consult:Consult requested for sacrum wound.  Pt was seen last month by wound care for this site and it is greatly improved in appearance and decreased in size. Wound type: Stage 2 Pressure Ulcer POA: Yes Measurement:.5X.5X.1cm Wound bed: 100% red Drainage (amount, consistency, odor) scant yellow, no odor Periwound: intact Dressing procedure/placement/frequency: Foam dressing to protect and promote healing. Will not plan to follow further unless re-consulted.  [address] RN, MSN, [employer]  [phone]

## 2011-05-28 NOTE — Progress Notes (Signed)
[facility] PROGRESS NOTE  Terry Palmer:[id number] DOB: 06/27/1975 DOA: 05/27/2011 PCP: Dorrene German, MD, MD Neurologist: Marlan Palau, MD  Assessment/Plan  1. Presumed UTI with early sepsis: Clinically appears better although blood pressure is borderline low. Continue IV fluids, broad-spectrum antibiotics given multiple hospitalizations, chronic indwelling Foley catheter and chronic bladder calculi. Follow-up urine culture and blood cultures. Most recent urine cultures were notable for yeast and Pseudomonas sensitive to Cipro and Zosyn. March 2013: Enterococcus sensitive to ampicillin. 2. Neurogenic bladder/bladder calculi: By review of previous urology consultations the patient is not felt to be a candidate for stone extraction or annihilation. 3. Normocytic anemia: Baseline hemoglobin approximately 10-12. Monitor. Repeat CBC in the morning. 4. End-stage multiple sclerosis: Continue Betaseron, baclofen, Zanaflex. 5. Chronic dysphasia: Continue tube feeds. 6. Stage II sacral decubitus: Wound care. 7. Severe malnutrition in context of chronic illness: Continue tube feeds. Pro-stat 30 mL once daily via tube.  Updated wife by telephone today.  Code Status: Full code  Family Communication: Wife Maxim Bedel, [phone]  Disposition Plan: Return to [facility] when improved, anticipated 48-72 hours.  Brendia Sacks, MD  [facility] Pager [phone]. If 8PM-8AM, please contact night-coverage at [email or web address], password [facility] 05/28/2011, 9:33 AM  LOS: 1 day   Brief narrative: 36 year old male with end-stage multiple sclerosis, quadriparesis, neurogenic bladder with chronic indwelling Foley catheter, bladder calculi and recurrent urinary tract infections presented to the emergency department from [facility] for fever, chills, diaphoresis, decreased level of consciousness. Admitted for treatment for sepsis and  UTI.  Consultants:    Procedures:    Antibiotics:  Ciprofloxacin May 19?  Diflucan May 19?  Zosyn May 19?  HPI/Subjective: Interval documentation review. Borderline hypotensive. Respiratory rate, oxygenation and heart rate stable. Afebrile. Blood cultures, urine culture pending.  Objective: Filed Vitals:   05/27/11 1447 05/28/11 0015 05/28/11 0310 05/28/11 0652  BP: 123/71 99/57 93/57  90/50  Pulse: 110 87 94 86  Temp: 98.6 F (37 C) 98.5 F (36.9 C) 97.5 F (36.4 C) 97.6 F (36.4 C)  TempSrc: Axillary Axillary Axillary Axillary  Resp: 20 18 18 18   Height:      Weight:    67 kg (147 lb 11.3 oz)  SpO2: 100% 100% 100% 100%    Intake/Output Summary (Last 24 hours) at 05/28/11 0933 Last data filed at 05/28/11 6578  Gross per 24 hour  Intake   4560 ml  Output   1225 ml  Net   3335 ml    Exam:   General:  Appears better today. Calm and comfortable in appearance. Able to speak a few words today.  Cardiovascular: Regular rate and rhythm. No murmur, rub, gallop.  Respiratory: Clear to auscultation bilaterally. No wheezes, rales, rhonchi. Normal respiratory effort.  Data Reviewed: Basic Metabolic Panel:  Lab 05/28/11 4696 05/27/11 1257 05/27/11 0700  NA 140 -- 142  K 3.6 -- 4.1  CL 107 -- 104  CO2 23 -- 20  GLUCOSE 103* -- 97  BUN 12 -- 17  CREATININE 0.85 0.77 0.78  CALCIUM 8.8 -- 10.0  MG -- -- --  PHOS -- -- --   Liver Function Tests:  Lab 05/27/11 0700  AST 43*  ALT 59*  ALKPHOS 110  BILITOT 0.3  PROT 8.1  ALBUMIN 3.9   CBC:  Lab 05/28/11 0602 05/27/11 1257 05/27/11 0700  WBC 7.7 17.2* 17.4*  NEUTROABS -- -- 13.9*  HGB 10.0* 12.1* 13.0  HCT 31.0* 36.1* 38.5*  MCV 84.2 83.0 82.6  PLT 350 397 476*   CBG:  Lab 05/28/11 0557 05/27/11 1633  GLUCAP 98 110*    Recent Results (from the past 240 hour(s))  MRSA PCR SCREENING     Status: Normal   Collection Time   05/27/11  1:12 PM      Component Value Range Status Comment   MRSA by  PCR NEGATIVE  NEGATIVE  Final     Studies: Dg Chest Port 1 View  05/27/2011  *RADIOLOGY REPORT*  Clinical Data: Shortness of breath  PORTABLE CHEST - 1 VIEW  IMPRESSION: No radiographic evidence for acute cardiopulmonary process.  Original Report Authenticated By: Waneta Martins, M.D.   Scheduled Meds:   . sodium chloride   Intravenous Once  . baclofen  10 mg Per Tube QID  . buPROPion  100 mg Per Tube TID  . ciprofloxacin  400 mg Intravenous Q12H  . docusate  100 mg Per Tube BID  . enoxaparin  40 mg Subcutaneous Q24H  . fluconazole (DIFLUCAN) IV  200 mg Intravenous Q24H  . free water  150 mL Per Tube 6 X Daily  . interferon beta-1b  0.3 mg Subcutaneous Q48H  . metoCLOPramide  10 mg Per Tube Q6H  . piperacillin-tazobactam (ZOSYN)  IV  3.375 g Intravenous Once  . piperacillin-tazobactam (ZOSYN)  IV  3.375 g Intravenous Q8H  . polyethylene glycol  17 g Per Tube Daily  . sodium chloride  500 mL Intravenous Once  . sodium chloride  3 mL Intravenous Q12H  . tiZANidine  4 mg Per Tube Q6H   Continuous Infusions:   . sodium chloride 75 mL/hr at 05/27/11 1319  . feeding supplement (OSMOLITE 1.2 CAL) 1,000 mL (05/27/11 1505)    Principal Problem:  *UTI (lower urinary tract infection) Active Problems:  Sepsis  Quadriparesis (muscle weakness)  Multiple sclerosis  Neurogenic bladder  Bladder calculi  Normocytic anemia  Sacral decubitus ulcer, stage II  Severe malnutrition

## 2011-05-29 ENCOUNTER — Inpatient Hospital Stay (HOSPITAL_COMMUNITY): Payer: Medicare HMO

## 2011-05-29 DIAGNOSIS — A419 Sepsis, unspecified organism: Secondary | ICD-10-CM

## 2011-05-29 DIAGNOSIS — G35 Multiple sclerosis: Secondary | ICD-10-CM

## 2011-05-29 DIAGNOSIS — N3000 Acute cystitis without hematuria: Secondary | ICD-10-CM

## 2011-05-29 DIAGNOSIS — N210 Calculus in bladder: Secondary | ICD-10-CM

## 2011-05-29 DIAGNOSIS — M25529 Pain in unspecified elbow: Secondary | ICD-10-CM | POA: Diagnosis present

## 2011-05-29 LAB — CBC
Hemoglobin: 10.5 g/dL — ABNORMAL LOW (ref 13.0–17.0)
MCH: 27.7 pg (ref 26.0–34.0)
MCV: 83.9 fL (ref 78.0–100.0)
RBC: 3.79 MIL/uL — ABNORMAL LOW (ref 4.22–5.81)
WBC: 7.7 10*3/uL (ref 4.0–10.5)

## 2011-05-29 NOTE — Progress Notes (Signed)
[facility] PROGRESS NOTE  Terry Palmer NWG:[id number] DOB: 1975/01/09 DOA: 05/27/2011 PCP: Dorrene German, MD, MD Neurologist: Marlan Palau, MD  Assessment/Plan  1. Presumed UTI with early sepsis: Continues to improve.  Blood pressure stable (may have been low secondary to drug-drug reaction between Cipro and Zanaflex). Stop fluids. Blood cultures NGTD. Urine culture unrevealing (patient was given Rocephin prior to admission). Given multiple hospitalizations, chronic indwelling Foley catheter, chronic bladder calculi and previous culture data will continue to treat empirically with Zosyn and Diflucan. Most recent urine cultures were notable for yeast and Pseudomonas sensitive to Cipro and Zosyn. March 2013: Enterococcus sensitive to ampicillin. 2. Neurogenic bladder/bladder calculi: By review of previous urology consultations the patient is not felt to be a candidate for stone extraction or annihilation. 3. Right elbow pain: Check x-ray given history of fall prior to admission. 4. Normocytic anemia: Stable. 5. End-stage multiple sclerosis: Continue Betaseron, baclofen, Zanaflex. 6. Chronic dysphasia: Continue tube feeds. 7. Stage II sacral decubitus: Wound care evaluation appreciated. 8. Severe malnutrition in context of chronic illness: Continue tube feeds. Pro-stat 30 mL once daily via tube.  Updated wife at bedside today. Can probably change to therapy per tube in 24-48 hours. Anticipate discharge 48 hours.  Code Status: Full code  Family Communication: Wife Terry Palmer, [phone]  Disposition Plan: Return to [facility] when improved, anticipated 48 hours.  Brendia Sacks, MD  [facility] Pager [phone]. If 8PM-8AM, please contact night-coverage at [email or web address], password [facility] 05/29/2011, 11:41 AM  LOS: 2 days   Brief narrative: 36 year old male with end-stage multiple sclerosis, quadriparesis, neurogenic bladder with chronic indwelling Foley  catheter, bladder calculi and recurrent urinary tract infections presented to the emergency department from [facility] for fever, chills, diaphoresis, decreased level of consciousness. Admitted for treatment for sepsis and UTI.  Consultants:  Wound care: Stage II ulcer greatly improved in appearance. Foam dressing to protect and promote healing.  Procedures:  None  Antibiotics:  Ciprofloxacin May 19?20  Diflucan May 19?  Zosyn May 19?  HPI/Subjective: Interval documentation reviewed. Afebrile, vital signs stable with minimal tachypnea. Hypotension was probably related to drug interaction between ciprofloxacin and Zanaflex. Wife at bedside. She reports the patient talked to her this morning and complained of right elbow pain. Apparently he fell out of bed at the facility a few days prior to admission.  Objective: Filed Vitals:   05/28/11 0652 05/28/11 1412 05/28/11 2042 05/29/11 0444  BP: 90/50 110/69 112/72 116/69  Pulse: 86 89 88 94  Temp: 97.6 F (36.4 C) 98 F (36.7 C) 98.4 F (36.9 C) 98.7 F (37.1 C)  TempSrc: Axillary Oral Oral Oral  Resp: 18 18 22 24   Height:      Weight: 67 kg (147 lb 11.3 oz)   65.5 kg (144 lb 6.4 oz)  SpO2: 100% 100% 100% 100%    Intake/Output Summary (Last 24 hours) at 05/29/11 1141 Last data filed at 05/29/11 1050  Gross per 24 hour  Intake    153 ml  Output   2300 ml  Net  -2147 ml   Exam:   General:  Appears calm and comfortable. Does not speak to me today.  Cardiovascular: Regular rate and rhythm. No murmur, rub, gallop.  Respiratory: Clear to auscultation bilaterally. No wheezes, rales, rhonchi. Normal respiratory effort.  Musculoskeletal: Normal passive movement at right elbow joint. No edema or ecchymosis.   Data Reviewed: Basic Metabolic Panel:  Lab 05/28/11 4010 05/27/11 1257 05/27/11 0700  NA 140 --  142  K 3.6 -- 4.1  CL 107 -- 104  CO2 23 -- 20  GLUCOSE 103* -- 97  BUN 12 -- 17  CREATININE 0.85 0.77 0.78   CALCIUM 8.8 -- 10.0  MG -- -- --  PHOS -- -- --   Liver Function Tests:  Lab 05/27/11 0700  AST 43*  ALT 59*  ALKPHOS 110  BILITOT 0.3  PROT 8.1  ALBUMIN 3.9   CBC:  Lab 05/29/11 0615 05/28/11 0602 05/27/11 1257 05/27/11 0700  WBC 7.7 7.7 17.2* 17.4*  NEUTROABS -- -- -- 13.9*  HGB 10.5* 10.0* 12.1* 13.0  HCT 31.8* 31.0* 36.1* 38.5*  MCV 83.9 84.2 83.0 82.6  PLT 347 350 397 476*   CBG:  Lab 05/28/11 0557 05/27/11 1633  GLUCAP 98 110*   Recent Results (from the past 240 hour(s))  CULTURE, BLOOD (ROUTINE X 2)     Status: Normal (Preliminary result)   Collection Time   05/27/11  7:00 AM      Component Value Range Status Comment   Specimen Description BLOOD RIGHT HAND   Final    Special Requests BOTTLES DRAWN AEROBIC ONLY 3CC   Final    Culture  Setup Time [id number]   Final    Culture     Final    Value:        BLOOD CULTURE RECEIVED NO GROWTH TO DATE CULTURE WILL BE HELD FOR 5 DAYS BEFORE ISSUING A FINAL NEGATIVE REPORT   Report Status PENDING   Incomplete   CULTURE, BLOOD (ROUTINE X 2)     Status: Normal (Preliminary result)   Collection Time   05/27/11  7:05 AM      Component Value Range Status Comment   Specimen Description BLOOD RIGHT HAND   Final    Special Requests BOTTLES DRAWN AEROBIC ONLY 5CC   Final    Culture  Setup Time [id number]   Final    Culture     Final    Value:        BLOOD CULTURE RECEIVED NO GROWTH TO DATE CULTURE WILL BE HELD FOR 5 DAYS BEFORE ISSUING A FINAL NEGATIVE REPORT   Report Status PENDING   Incomplete   URINE CULTURE     Status: Normal   Collection Time   05/27/11  9:30 AM      Component Value Range Status Comment   Specimen Description URINE, CATHETERIZED   Final    Special Requests NONE   Final    Culture  Setup Time [id number]   Final    Colony Count 80,000 COLONIES/ML   Final    Culture     Final    Value: Multiple bacterial morphotypes present, none predominant. Suggest appropriate recollection if clinically indicated.    Report Status 05/28/2011 FINAL   Final   MRSA PCR SCREENING     Status: Normal   Collection Time   05/27/11  1:12 PM      Component Value Range Status Comment   MRSA by PCR NEGATIVE  NEGATIVE  Final     Studies: Dg Chest Port 1 View  05/27/2011  *RADIOLOGY REPORT*  Clinical Data: Shortness of breath  PORTABLE CHEST - 1 VIEW  IMPRESSION: No radiographic evidence for acute cardiopulmonary process.  Original Report Authenticated By: Waneta Martins, M.D.   Scheduled Meds:    . baclofen  10 mg Per Tube QID  . buPROPion  100 mg Per Tube TID  . docusate  100 mg Per Tube  BID  . enoxaparin  40 mg Subcutaneous Q24H  . feeding supplement  30 mL Per Tube Q1200  . fluconazole (DIFLUCAN) IV  200 mg Intravenous Q24H  . free water  150 mL Per Tube 6 X Daily  . interferon beta-1b  0.3 mg Subcutaneous Q48H  . metoCLOPramide  10 mg Per Tube Q6H  . piperacillin-tazobactam (ZOSYN)  IV  3.375 g Intravenous Q8H  . polyethylene glycol  17 g Per Tube Daily  . sodium chloride  3 mL Intravenous Q12H  . tiZANidine  4 mg Per Tube Q6H  . DISCONTD: ciprofloxacin  400 mg Intravenous Q12H   Continuous Infusions:    . sodium chloride 125 mL/hr at 05/28/11 2340  . feeding supplement (OSMOLITE 1.2 CAL) 1,000 mL (05/28/11 2039)   Principal Problem:  *UTI (lower urinary tract infection) Active Problems:  Sepsis  Quadriparesis (muscle weakness)  Multiple sclerosis  Neurogenic bladder  Bladder calculi  Normocytic anemia  Sacral decubitus ulcer, stage II  Severe malnutrition  Elbow pain

## 2011-05-29 NOTE — Progress Notes (Signed)
Pts brother stated the pt was in pain.  Pt received 2 mg morphine IV. Will continue to monitor.

## 2011-05-30 DIAGNOSIS — N210 Calculus in bladder: Secondary | ICD-10-CM

## 2011-05-30 DIAGNOSIS — A419 Sepsis, unspecified organism: Secondary | ICD-10-CM

## 2011-05-30 DIAGNOSIS — G35D Multiple sclerosis, unspecified: Secondary | ICD-10-CM

## 2011-05-30 DIAGNOSIS — N3000 Acute cystitis without hematuria: Secondary | ICD-10-CM

## 2011-05-30 DIAGNOSIS — G35 Multiple sclerosis: Secondary | ICD-10-CM

## 2011-05-30 LAB — CBC
HCT: 33.2 % — ABNORMAL LOW (ref 39.0–52.0)
Hemoglobin: 10.9 g/dL — ABNORMAL LOW (ref 13.0–17.0)
MCV: 83.6 fL (ref 78.0–100.0)
RDW: 14.8 % (ref 11.5–15.5)
WBC: 9.3 10*3/uL (ref 4.0–10.5)

## 2011-05-30 NOTE — Progress Notes (Signed)
Patient ID: Terry Palmer  male  ZOX:[id number]    DOB: 05/03/1975    DOA: 05/27/2011  PCP: Dorrene German, MD, MD  Subjective: Afebrile, awake and staring although lethargic, wife at bedside.  Objective: Weight change: -0.5 kg (-1 lb 1.6 oz)  Intake/Output Summary (Last 24 hours) at 05/30/11 1454 Last data filed at 05/30/11 1300  Gross per 24 hour  Intake      0 ml  Output   2575 ml  Net  -2575 ml   Blood pressure 123/72, pulse 100, temperature 98.1 F (36.7 C), temperature source Oral, resp. rate 22, height 6\' 2"  (1.88 m), weight 65 kg (143 lb 4.8 oz), SpO2 98.00%.  Physical Exam: General: awake, and staring, noncommunicative HEENT: anicteric sclera, pupils reactive to light and accommodation, EOMI CVS: S1-S2 clear, no murmur rubs or gallops Chest: clear to auscultation bilaterally, no wheezing, rales or rhonchi Abdomen: soft nontender, nondistended, normal bowel sounds, no organomegaly Extremities: no cyanosis, clubbing or edema noted bilaterally   Lab Results: Basic Metabolic Panel:  Lab 05/28/11 8119 05/27/11 1257 05/27/11 0700  NA 140 -- 142  K 3.6 -- 4.1  CL 107 -- 104  CO2 23 -- 20  GLUCOSE 103* -- 97  BUN 12 -- 17  CREATININE 0.85 0.77 --  CALCIUM 8.8 -- 10.0  MG -- -- --  PHOS -- -- --   Liver Function Tests:  Lab 05/27/11 0700  AST 43*  ALT 59*  ALKPHOS 110  BILITOT 0.3  PROT 8.1  ALBUMIN 3.9   CBC:  Lab 05/30/11 0545 05/29/11 0615 05/27/11 0700  WBC 9.3 7.7 --  NEUTROABS -- -- 13.9*  HGB 10.9* 10.5* --  HCT 33.2* 31.8* --  MCV 83.6 83.9 --  PLT 386 347 --   CBG:  Lab 05/28/11 0557 05/27/11 1633  GLUCAP 98 110*     Micro Results: Recent Results (from the past 240 hour(s))  CULTURE, BLOOD (ROUTINE X 2)     Status: Normal (Preliminary result)   Collection Time   05/27/11  7:00 AM      Component Value Range Status Comment   Specimen Description BLOOD RIGHT HAND   Final    Special Requests BOTTLES DRAWN AEROBIC ONLY 3CC   Final    Culture  Setup Time [id number]   Final    Culture     Final    Value:        BLOOD CULTURE RECEIVED NO GROWTH TO DATE CULTURE WILL BE HELD FOR 5 DAYS BEFORE ISSUING A FINAL NEGATIVE REPORT   Report Status PENDING   Incomplete   CULTURE, BLOOD (ROUTINE X 2)     Status: Normal (Preliminary result)   Collection Time   05/27/11  7:05 AM      Component Value Range Status Comment   Specimen Description BLOOD RIGHT HAND   Final    Special Requests BOTTLES DRAWN AEROBIC ONLY 5CC   Final    Culture  Setup Time [id number]   Final    Culture     Final    Value:        BLOOD CULTURE RECEIVED NO GROWTH TO DATE CULTURE WILL BE HELD FOR 5 DAYS BEFORE ISSUING A FINAL NEGATIVE REPORT   Report Status PENDING   Incomplete   URINE CULTURE     Status: Normal   Collection Time   05/27/11  9:30 AM      Component Value Range Status Comment   Specimen Description URINE, CATHETERIZED  Final    Special Requests NONE   Final    Culture  Setup Time [id number]   Final    Colony Count 80,000 COLONIES/ML   Final    Culture     Final    Value: Multiple bacterial morphotypes present, none predominant. Suggest appropriate recollection if clinically indicated.   Report Status 05/28/2011 FINAL   Final   MRSA PCR SCREENING     Status: Normal   Collection Time   05/27/11  1:12 PM      Component Value Range Status Comment   MRSA by PCR NEGATIVE  NEGATIVE  Final     Studies/Results: Dg Elbow 2 Views Right  05/29/2011  *RADIOLOGY REPORT*  Clinical Data: History of injury from fall.  History of pain.  RIGHT ELBOW - 2 VIEW  Comparison: None.  Findings: No joint effusion is evident. Alignment is normal.  Joint spaces are preserved.  No fracture or dislocation is evident.  No soft tissue lesions are seen.  IMPRESSION: No fracture or dislocation is evident.  Original Report Authenticated By: Crawford Givens, M.D.   Dg Abd 1 View  05/02/2011  *RADIOLOGY REPORT*  Clinical Data: 36 year old male with pneumoperitoneum.  Constipation.  ABDOMEN - 1 VIEW  Comparison:   04/24/2011 and earlier.  Findings: Two supine views of the abdomen pelvis. Nonobstructed bowel gas pattern.Percutaneous enteric tube in the epigastric region.  No definite pneumoperitoneum, but a fairly large volume of pneumoperitoneum is not evident on the previous supine views (just the decubitus views). Stable visualized osseous structures.    IMPRESSION: 1.  No evidence of persistent pneumoperitoneum on the supine views. A repeat left side down lateral decubitus view would be more sensitive. 2. Nonobstructed bowel gas pattern.  Original Report Authenticated By: Harley Hallmark, M.D.   Dg Chest Port 1 View  05/27/2011  *RADIOLOGY REPORT*  Clinical Data: Shortness of breath  PORTABLE CHEST - 1 VIEW  Comparison: 04/20/2011  Findings: Lungs are clear. No pleural effusion or pneumothorax. The cardiomediastinal contours are within normal limits. The visualized bones and soft tissues are without significant appreciable abnormality.  IMPRESSION: No radiographic evidence for acute cardiopulmonary process.  Original Report Authenticated By: Waneta Martins, M.D.    Medications: Scheduled Meds:   . baclofen  10 mg Per Tube QID  . buPROPion  100 mg Per Tube TID  . docusate  100 mg Per Tube BID  . enoxaparin  40 mg Subcutaneous Q24H  . feeding supplement  30 mL Per Tube Q1200  . fluconazole (DIFLUCAN) IV  200 mg Intravenous Q24H  . free water  150 mL Per Tube 6 X Daily  . interferon beta-1b  0.3 mg Subcutaneous Q48H  . metoCLOPramide  10 mg Per Tube Q6H  . piperacillin-tazobactam (ZOSYN)  IV  3.375 g Intravenous Q8H  . polyethylene glycol  17 g Per Tube Daily  . sodium chloride  3 mL Intravenous Q12H  . tiZANidine  4 mg Per Tube Q6H   Continuous Infusions:   . feeding supplement (OSMOLITE 1.2 CAL) 1,000 mL (05/30/11 0554)     Assessment/Plan: Principal Problem:  *UTI (lower urinary tract infection) the early sepsis - Blood cultures negative to  date, continue with Zosyn and Diflucan. Will transition to ciprofloxacin tomorrow  Active Problems:   Multiple sclerosis: End-stage with the quadriparesis   Sacral decubitus ulcer, stage II: Wound care consult appreciated   Neurogenic bladder/bladder calculi:  - Not a candidate for stone extraction per previous urology consultations, outpatient followup  with urology    Severe malnutrition continue the tube feeds   Normocytic anemia stable   Elbow pain - X-ray negative for any fracture or dislocation  DVT Prophylaxis: Lovenox  Code Status: Full code Disposition: Not medically ready    LOS: 3 days   Hazell Siwik M.D. [facility] 05/30/2011, 2:54 PM Pager: [phone]

## 2011-05-30 NOTE — Progress Notes (Signed)
ANTIBIOTIC CONSULT NOTE - FOLLOW UP  Pharmacy Consult for Zosyn and Diflucan Indication: UTI  No Known Allergies  Patient Measurements: Height: 6\' 2"  (188 cm) (stated by family) Weight: 143 lb 4.8 oz (65 kg) IBW/kg (Calculated) : 82.2   Vital Signs: Temp: 98.1 F (36.7 C) (05/22 0500) Temp src: Oral (05/22 0500) BP: 123/72 mmHg (05/22 0500) Pulse Rate: 100  (05/22 0500) Intake/Output from previous day: 05/21 0701 - 05/22 0700 In: 153 [I.V.:3] Out: 4075 [Urine:4075] Intake/Output from this shift: Total I/O In: 0  Out: 600 [Urine:600]  Labs:  Basename 05/30/11 0545 05/29/11 0615 05/28/11 0602 05/27/11 1257  WBC 9.3 7.7 7.7 --  HGB 10.9* 10.5* 10.0* --  PLT 386 347 350 --  LABCREA -- -- -- --  CREATININE -- -- 0.85 0.77   Estimated Creatinine Clearance: 109.4 ml/min (by C-G formula based on Cr of 0.85). No results found for this basename: VANCOTROUGH:2,VANCOPEAK:2,VANCORANDOM:2,GENTTROUGH:2,GENTPEAK:2,GENTRANDOM:2,TOBRATROUGH:2,TOBRAPEAK:2,TOBRARND:2,AMIKACINPEAK:2,AMIKACINTROU:2,AMIKACIN:2, in the last 72 hours   Microbiology: Recent Results (from the past 720 hour(s))  URINE CULTURE     Status: Normal   Collection Time   05/02/11  6:52 AM      Component Value Range Status Comment   Specimen Description URINE, CATHETERIZED   Final    Special Requests PATIENT ON FOLLOWING CIPRO   Final    Culture  Setup Time [id number]   Final    Colony Count 60,000 COLONIES/ML   Final    Culture YEAST   Final    Report Status 05/03/2011 FINAL   Final   CULTURE, BLOOD (ROUTINE X 2)     Status: Normal (Preliminary result)   Collection Time   05/27/11  7:00 AM      Component Value Range Status Comment   Specimen Description BLOOD RIGHT HAND   Final    Special Requests BOTTLES DRAWN AEROBIC ONLY 3CC   Final    Culture  Setup Time [id number]   Final    Culture     Final    Value:        BLOOD CULTURE RECEIVED NO GROWTH TO DATE CULTURE WILL BE HELD FOR 5 DAYS BEFORE ISSUING A  FINAL NEGATIVE REPORT   Report Status PENDING   Incomplete   CULTURE, BLOOD (ROUTINE X 2)     Status: Normal (Preliminary result)   Collection Time   05/27/11  7:05 AM      Component Value Range Status Comment   Specimen Description BLOOD RIGHT HAND   Final    Special Requests BOTTLES DRAWN AEROBIC ONLY 5CC   Final    Culture  Setup Time [id number]   Final    Culture     Final    Value:        BLOOD CULTURE RECEIVED NO GROWTH TO DATE CULTURE WILL BE HELD FOR 5 DAYS BEFORE ISSUING A FINAL NEGATIVE REPORT   Report Status PENDING   Incomplete   URINE CULTURE     Status: Normal   Collection Time   05/27/11  9:30 AM      Component Value Range Status Comment   Specimen Description URINE, CATHETERIZED   Final    Special Requests NONE   Final    Culture  Setup Time [id number]   Final    Colony Count 80,000 COLONIES/ML   Final    Culture     Final    Value: Multiple bacterial morphotypes present, none predominant. Suggest appropriate recollection if clinically indicated.   Report Status 05/28/2011 FINAL  Final   MRSA PCR SCREENING     Status: Normal   Collection Time   05/27/11  1:12 PM      Component Value Range Status Comment   MRSA by PCR NEGATIVE  NEGATIVE  Final     Anti-infectives     Start     Dose/Rate Route Frequency Ordered Stop   05/27/11 1500   ciprofloxacin (CIPRO) IVPB 400 mg  Status:  Discontinued        400 mg 200 mL/hr over 60 Minutes Intravenous Every 12 hours 05/27/11 1404 05/28/11 1335   05/27/11 1500  piperacillin-tazobactam (ZOSYN) IVPB 3.375 g       3.375 g 12.5 mL/hr over 240 Minutes Intravenous 3 times per day 05/27/11 1404     05/27/11 1500   fluconazole (DIFLUCAN) IVPB 200 mg        200 mg 100 mL/hr over 60 Minutes Intravenous Every 24 hours 05/27/11 1404     05/27/11 1015  piperacillin-tazobactam (ZOSYN) IVPB 3.375 g       3.375 g 12.5 mL/hr over 240 Minutes Intravenous  Once 05/27/11 1002 05/27/11 1427          Assessment: 36 yo Palmer who  presented with fever, chills, diaphoresis, AMS, and difficulty breathing. Recent hospitalization 04/2011 for pseudomonas UTI treated w/ vanc/primaxin-->cipro along with many other previous UTI infections with yeast, enterobacter.  Pharmacy asked to manage Diflucan/Zosyn for presumed UTI with early sepsis, now on day 3. Urine culture resulted with no predominate organism. Afeb and WBC trending down.  Goal of Therapy:  treat infection appropriately  Plan:  Continue Zosyn 3.375 g IV q8h to be infused over 4 hours Continue Diflucan 200 mg IV q24h for now - consider d/c based on urine culture results  [place], Pharm.D., BCPS Clinical Pharmacist 05/30/2011 11:02 AM

## 2011-05-31 DIAGNOSIS — A419 Sepsis, unspecified organism: Secondary | ICD-10-CM

## 2011-05-31 DIAGNOSIS — N3000 Acute cystitis without hematuria: Secondary | ICD-10-CM

## 2011-05-31 DIAGNOSIS — G35 Multiple sclerosis: Secondary | ICD-10-CM

## 2011-05-31 DIAGNOSIS — N210 Calculus in bladder: Secondary | ICD-10-CM

## 2011-05-31 LAB — CBC
HCT: 34.7 % — ABNORMAL LOW (ref 39.0–52.0)
Hemoglobin: 11.5 g/dL — ABNORMAL LOW (ref 13.0–17.0)
MCH: 27.4 pg (ref 26.0–34.0)
MCHC: 33.1 g/dL (ref 30.0–36.0)
MCV: 82.8 fL (ref 78.0–100.0)
Platelets: 401 10*3/uL — ABNORMAL HIGH (ref 150–400)
RBC: 4.19 MIL/uL — ABNORMAL LOW (ref 4.22–5.81)
RDW: 14.8 % (ref 11.5–15.5)
WBC: 8.7 10*3/uL (ref 4.0–10.5)

## 2011-05-31 MED ORDER — CIPROFLOXACIN HCL 500 MG PO TABS: 500.0000 mg | ORAL_TABLET | Freq: Two times a day (BID) | ORAL | Status: AC

## 2011-05-31 MED ORDER — FLUCONAZOLE 200 MG PO TABS: 200.0000 mg | ORAL_TABLET | Freq: Every day | ORAL | Status: AC

## 2011-05-31 NOTE — Progress Notes (Signed)
UR Completed. Rafan Sanders, RN, Nurse Case Manager [phone]     

## 2011-05-31 NOTE — Progress Notes (Signed)
Pt transferred to [facility] per ambulance with discharge orders. All paperwork given to ambulance personnel. J-Tube flushed and clamped for transfer. Pt's wife aware of pending transfer back to facility today. Marisa Cyphers RN

## 2011-05-31 NOTE — Progress Notes (Signed)
Clinical Social Worker facilitated discharge by contacting facility, [facility] and family. Wife was at bedside. Patient will be transported via EMS. Patient's discharge packet, EMS form and facesheet will be placed in designated room number at nurses station. CSW will sign off as social work intervention is no longer needed.   Rozetta Nunnery MSW, [employer] [phone]

## 2011-05-31 NOTE — Progress Notes (Signed)
Pt is non verbal.Has had MS for 10 yrs & unable to teach pt for pt ed or careplan at this time. Marisa Cyphers RN

## 2011-05-31 NOTE — Discharge Summary (Signed)
Physician Discharge Summary  Patient ID: Terry Palmer MRN: [id number] DOB/AGE: May 18, 1975 35 y.o.  Admit date: 05/27/2011 Discharge date: 05/31/2011  Primary Care Physician:  Dorrene German, MD, MD  Discharge Diagnoses:    . recurrent UTI (lower urinary tract infection) .Sepsis .Sacral decubitus ulcer, stage II .Quadriparesis (muscle weakness) .Multiple sclerosis .Neurogenic bladder .Bladder calculi .Severe malnutrition .Normocytic anemia .Elbow pain  Consults: None   Discharge Medications: Medication List  As of 05/31/2011 12:09 PM   STOP taking these medications         baclofen 20 MG tablet      cefTRIAXone 1 G injection      nitrofurantoin (macrocrystal-monohydrate) 100 MG capsule      predniSONE 5 MG/5ML solution         TAKE these medications         acetaminophen 160 MG/5ML solution   Commonly known as: TYLENOL   650 mg by PEG Tube route every 6 (six) hours as needed. For moderate pain      BETASERON 0.3 MG injection   Generic drug: interferon beta-1b   Inject 0.3 mg into the skin every other day. Odd days      buPROPion 100 MG tablet   Commonly known as: WELLBUTRIN   100 mg by PEG Tube route 3 (three) times daily.      cholecalciferol 1000 UNITS tablet   Commonly known as: VITAMIN D   Take 1,000 Units by mouth 2 (two) times daily.      ciprofloxacin 500 MG tablet   Commonly known as: CIPRO   Place 1 tablet (500 mg total) into feeding tube 2 (two) times daily. X 10days      Cranberry 475 MG Caps   1 capsule by PEG Tube route 2 (two) times daily.      docusate 50 MG/5ML liquid   Commonly known as: COLACE   100 mg by PEG Tube route 2 (two) times daily.      feeding supplement (OSMOLITE 1.5 CAL) Liqd   Place 1,000 mLs into feeding tube continuous.      fluconazole 200 MG tablet   Commonly known as: DIFLUCAN   Place 1 tablet (200 mg total) into feeding tube daily. X 10 days      free water Soln   Place 150 mLs into feeding tube 6 (six)  times daily.      Garlic 1000 MG Caps   1,000 mg by PEG Tube route daily.      metoCLOPramide 5 MG/5ML solution   Commonly known as: REGLAN   Place 10 mg into feeding tube every 6 (six) hours.      mulitivitamin with minerals Tabs   1 tablet by PEG Tube route daily.      omega-3 acid ethyl esters 1 G capsule   Commonly known as: LOVAZA   Take 1 g by mouth daily.      polyethylene glycol packet   Commonly known as: MIRALAX / GLYCOLAX   17 g by PEG Tube route daily.      tiZANidine 2 MG tablet   Commonly known as: ZANAFLEX   4 mg by PEG Tube route every 6 (six) hours.      UTI-Stat Liqd   30 mLs by PEG Tube route 2 (two) times daily.      VITAMIN C PO   5 mLs by PEG Tube route 2 (two) times daily. Vitamin C 500mg /44ml liquid      zinc sulfate 220 MG capsule  220 mg by PEG Tube route daily.             Brief H and P: For complete details please refer to admission H and P, but in brief per admission note by Dr. Irene Limbo, patient is a 36 year old man with history of end-stage multiple sclerosis, quadriparesis, neurogenic bladder with chronic indwelling Foley catheter, recurrent urinary tract infections presented to the emergency department from [facility] for fever, chills, diaphoresis, decreased level of consciousness, difficulty breathing. History was obtained from father at bedside, emergency department physician and documentation as patient is nonverbal. By [facility] patient was started on Rocephin today as an outpatient, presumably for a UTI although this was not confirmed.  Review the records was notable for recurrent urinary tract infections and progression of multiple sclerosis. Last documented post status was full code.  In the emergency department was noted to have a temperature 100.1, tachycardia with rate 132. Normotensive. Minimal hypoxia. Complete metabolic panel is unremarkable but CBC was notable for a leukocytosis of 17.4. Lactic acid 2.6. Urinalysis was  equivocal. Chest x-ray was negative. Patient was treated with a 1 L bolus of normal saline and Zosyn and referred for admission for presumed UTI with early sepsis.   Hospital Course:  Patient was admitted for acute febrile illness with early sepsis presumed from UTI. He was aggressively treated with IV fluids and broad-spectrum antibiotics given multiple hospitalizations. UTI (lower urinary tract infection) with early sepsis: Clinically improved, blood culture remained negative to date. Urine culture showed 80,000 colonies. With the prior record of urine cultures patient was started on IV Diflucan and Zosyn. Patient was transitioned to ciprofloxacin and Diflucan per tube to continue for 10 days for complicated UTI. Patient's wife was also recommended to make followup appointment with urology in 10 days due to recurrent UTIs.   Multiple sclerosis: End-stage with the quadriparesis  Sacral decubitus ulcer, stage II: Patient was seen by wound care consultation during hospitalization   Neurogenic bladder/bladder calculi:  Not a candidate for stone extraction per previous urology consultations, outpatient followup with urology recommended to patient's wife. Severe malnutrition: continue the tube feeds  Elbow pain: X-ray was negative for any fracture or dislocation   Day of Discharge BP 109/66  Pulse 101  Temp(Src) 97.7 F (36.5 C) (Axillary)  Resp 18  Ht 6\' 2"  (1.88 m)  Wt 66.2 kg (145 lb 15.1 oz)  BMI 18.74 kg/m2  SpO2 100%  Physical Exam: General: Alert and awake, not in any acute distress, blinks his eyes for yes and no questions. Nonverbal and unable to follow any commands HEENT: anicteric sclera, pupils reactive to light and accommodation CVS: S1-S2 clear no murmur rubs or gallops Chest: clear to auscultation bilaterally, no wheezing rales or rhonchi Abdomen: PEG tube with well-healed vertical incision soft nontender Extremities: Contractures at the hips and knees     The results of  significant diagnostics from this hospitalization (including imaging, microbiology, ancillary and laboratory) are listed below for reference.    LAB RESULTS: Basic Metabolic Panel:  Lab 05/28/11 2130 05/27/11 1257 05/27/11 0700  NA 140 -- 142  K 3.6 -- 4.1  CL 107 -- 104  CO2 23 -- 20  GLUCOSE 103* -- 97  BUN 12 -- 17  CREATININE 0.85 0.77 --  CALCIUM 8.8 -- 10.0  MG -- -- --  PHOS -- -- --   Liver Function Tests:  Lab 05/27/11 0700  AST 43*  ALT 59*  ALKPHOS 110  BILITOT 0.3  PROT 8.1  ALBUMIN 3.9   CBC:  Lab 05/31/11 0632 05/30/11 0545 05/27/11 0700  WBC 8.7 9.3 --  NEUTROABS -- -- 13.9*  HGB 11.5* 10.9* --  HCT 34.7* 33.2* --  MCV 82.8 -- --  PLT 401* 386 --   CBG:  Lab 05/28/11 0557 05/27/11 1633  GLUCAP 98 110*    Significant Diagnostic Studies:  Dg Chest Port 1 View  05/27/2011  *RADIOLOGY REPORT*  Clinical Data: Shortness of breath  PORTABLE CHEST - 1 VIEW  Comparison: 04/20/2011  Findings: Lungs are clear. No pleural effusion or pneumothorax. The cardiomediastinal contours are within normal limits. The visualized bones and soft tissues are without significant appreciable abnormality.  IMPRESSION: No radiographic evidence for acute cardiopulmonary process.  Original Report Authenticated By: Waneta Martins, M.D.     Disposition and Follow-up: Discharge Orders    Future Orders Please Complete By Expires   Increase activity slowly      Discharge instructions      Comments:   Please make urology appointment in next 2 weeks due to recurrent UTI's       DISPOSITION: Skilled nursing facility  DIET: via PEG tube  ACTIVITY: As tolerated  DISCHARGE FOLLOW-UP Follow-up Information    Follow up with AVBUERE,EDWIN A, MD. Schedule an appointment as soon as possible for a visit in 2 weeks.   Contact information:   [address] [place] [address] [phone]          Time spent on Discharge: 45  minutes  Signed:  Farhan Jean M.D. [facility] 05/31/2011, 12:09 PM

## 2011-06-02 LAB — CULTURE, BLOOD (ROUTINE X 2)
Culture  Setup Time: 201305191139
Culture  Setup Time: 201305191139
Culture: NO GROWTH

## 2011-06-13 ENCOUNTER — Other Ambulatory Visit (HOSPITAL_COMMUNITY): Payer: Self-pay | Admitting: Internal Medicine

## 2011-06-13 DIAGNOSIS — R9389 Abnormal findings on diagnostic imaging of other specified body structures: Secondary | ICD-10-CM

## 2011-06-13 DIAGNOSIS — M899 Disorder of bone, unspecified: Secondary | ICD-10-CM

## 2011-06-19 ENCOUNTER — Encounter (HOSPITAL_COMMUNITY)
Admission: RE | Admit: 2011-06-19 | Discharge: 2011-06-19 | Disposition: A | Discharge status: Home / Self Care | Payer: Medicare HMO | Source: Ambulatory Visit | Attending: Internal Medicine | Admitting: Internal Medicine

## 2011-06-19 ENCOUNTER — Ambulatory Visit (HOSPITAL_COMMUNITY)
Admission: RE | Admit: 2011-06-19 | Discharge: 2011-06-19 | Disposition: A | Discharge status: Home / Self Care | Payer: Medicare HMO | Source: Ambulatory Visit | Attending: Internal Medicine | Admitting: Internal Medicine

## 2011-06-19 DIAGNOSIS — M899 Disorder of bone, unspecified: Secondary | ICD-10-CM

## 2011-06-19 DIAGNOSIS — C9000 Multiple myeloma not having achieved remission: Secondary | ICD-10-CM | POA: Insufficient documentation

## 2011-06-19 DIAGNOSIS — R937 Abnormal findings on diagnostic imaging of other parts of musculoskeletal system: Secondary | ICD-10-CM | POA: Insufficient documentation

## 2011-06-19 DIAGNOSIS — R9389 Abnormal findings on diagnostic imaging of other specified body structures: Secondary | ICD-10-CM

## 2011-06-19 MED ORDER — TECHNETIUM TC 99M MEDRONATE IV KIT
25.0000 | PACK | Freq: Once | INTRAVENOUS | Status: AC | PRN
  Administered 2011-06-19: 25 via INTRAVENOUS

## 2011-07-28 ENCOUNTER — Emergency Department (HOSPITAL_COMMUNITY): Payer: Medicare HMO

## 2011-07-28 ENCOUNTER — Emergency Department (HOSPITAL_COMMUNITY)
Admission: EM | Admit: 2011-07-28 | Discharge: 2011-07-28 | Disposition: A | Discharge status: Skilled Nursing Facility | Payer: Medicare HMO | Attending: Emergency Medicine | Admitting: Emergency Medicine

## 2011-07-28 DIAGNOSIS — Z431 Encounter for attention to gastrostomy: Secondary | ICD-10-CM

## 2011-07-28 DIAGNOSIS — G35 Multiple sclerosis: Secondary | ICD-10-CM | POA: Insufficient documentation

## 2011-07-28 DIAGNOSIS — Z87891 Personal history of nicotine dependence: Secondary | ICD-10-CM | POA: Insufficient documentation

## 2011-07-28 DIAGNOSIS — F3289 Other specified depressive episodes: Secondary | ICD-10-CM | POA: Insufficient documentation

## 2011-07-28 DIAGNOSIS — F329 Major depressive disorder, single episode, unspecified: Secondary | ICD-10-CM | POA: Insufficient documentation

## 2011-07-28 DIAGNOSIS — Z79899 Other long term (current) drug therapy: Secondary | ICD-10-CM | POA: Insufficient documentation

## 2011-07-28 MED ORDER — IOHEXOL 300 MG/ML  SOLN
10.0000 mL | Freq: Once | INTRAMUSCULAR | Status: AC | PRN
  Administered 2011-07-28: 10 mL

## 2011-07-28 NOTE — Procedures (Signed)
Successful replacement of an 20fr balloon retention gtube No comp Ready for use Full report in PACS

## 2011-07-28 NOTE — ED Notes (Signed)
Patient is alert to voice.  His PEG tube was reinserted and he has been sent back to [facility].  V/S stable.   He is not showing any signs of distress at this time

## 2011-07-28 NOTE — ED Notes (Signed)
EMS called to Terry Palmer.  Patient was found laying in bed with PEG tube removed. Patient is non verbal.  Showing no signs of pain.  No bleeding present

## 2011-07-28 NOTE — ED Provider Notes (Signed)
History     CSN: [id number]  Arrival date & time 07/28/11  4782   First MD Initiated Contact with Patient 07/28/11 1008      Chief Complaint  Patient presents with  . Wound Check    PEG tube replacement    (Consider location/radiation/quality/duration/timing/severity/associated sxs/prior treatment) Patient is a 36 y.o. male presenting with wound check. The history is provided by the patient.  Wound Check    patient was sent in from [facility] health care for PEG tube replacement. Reportedly found at 3 in the morning with a PEG tube. Patient has no family doctor to get IR to replace tube in the morning. His history of severe MS and is nonverbal. He is also nonambulatory.  Past Medical History  Diagnosis Date  . MS (multiple sclerosis)   . 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 Childhood asthma   . Depression   . Neuromuscular disorder     Quadraperesis  . Recurrent UTI   . Dysphagia   . Bladder calculi   . Neurogenic bladder   . Shortness of breath   . Recurrent upper respiratory infection (URI)   . Normocytic anemia 05/28/2011    Past Surgical History  Procedure Date  . Lumbar puncture 10/12/2002  . Gastrostomy 04/16/2011    Procedure: GASTROSTOMY;  Surgeon: Liz Malady, MD;  Location: [facility] OR;  Service: General;  Laterality: N/A;  Open G-Tube placement    Family History  Problem Relation Age of Onset  . Asthma Mother     History  Substance Use Topics  . Smoking status: Former Smoker    Types: Cigarettes    Quit date: 02/02/1999  . Smokeless tobacco: Former Neurosurgeon  . Alcohol Use: No      Review of Systems  Unable to perform ROS   Allergies  Review of patient's allergies indicates no known allergies.  Home Medications   Current Outpatient Rx  Name Route Sig Dispense Refill  . ACETAMINOPHEN 160 MG/5ML PO SOLN PEG Tube 160 mg by PEG Tube route every 6 (six) hours as needed. For pain.    Marland Kitchen VITAMIN C 500 MG/5ML PO SYRP PEG Tube 500 mg by PEG Tube  route 2 (two) times daily.    . BUPROPION HCL 100 MG PO TABS PEG Tube 100 mg by PEG Tube route 3 (three) times daily.    . CEPHALEXIN 500 MG PO CAPS PEG Tube 500 mg by PEG Tube route 3 (three) times daily. He is taking from 07/25/11 until 07/28/11 for a urinary tract infection.. He has not completed this regimen.    Marland Kitchen VITAMIN D 1000 UNITS PO TABS PEG Tube 1,000 Units by PEG Tube route 2 (two) times daily.     Marland Kitchen CRANBERRY 475 MG PO CAPS PEG Tube 1 capsule by PEG Tube route 2 (two) times daily.    Marland Kitchen UTI-STAT PO LIQD PEG Tube 30 mLs by PEG Tube route 2 (two) times daily.    Marland Kitchen DOCUSATE SODIUM 50 MG/5ML PO LIQD PEG Tube 100 mg by PEG Tube route every morning.     Marland Kitchen GARLIC 500 MG PO CAPS PEG Tube 1,000 mg by PEG Tube route every morning.    Marland Kitchen HYDROCODONE-ACETAMINOPHEN 7.5-500 MG/15ML PO SOLN PEG Tube 10 mLs by PEG Tube route every 4 (four) hours as needed. For pain.    . INTERFERON BETA-1B 0.3 MG [place] SOLR Subcutaneous Inject 0.3 mg into the skin every other day. He takes on even days.    Marland Kitchen LEVETIRACETAM 500  MG PO TABS PEG Tube 500 mg by PEG Tube route 2 (two) times daily.    Marland Kitchen METOCLOPRAMIDE HCL 10 MG/10ML PO SOLN PEG Tube 5 mg by PEG Tube route 4 (four) times daily.    . ADULT MULTIVITAMIN W/MINERALS CH PEG Tube 1 tablet by PEG Tube route every morning.     . OSMOLITE 1.5 CAL PO LIQD Per Tube Place 70 mLs into feeding tube continuous.     . OMEGA-3-ACID ETHYL ESTERS 1 G PO CAPS PEG Tube 1 g by PEG Tube route every morning.    Marland Kitchen POLYETHYLENE GLYCOL 3350 PO PACK PEG Tube 17 g by PEG Tube route every other day.     Marland Kitchen TIZANIDINE HCL 2 MG PO TABS PEG Tube 4 mg by PEG Tube route every 6 (six) hours.    Marland Kitchen FREE WATER Per Tube Place 150 mLs into feeding tube 6 (six) times daily. 500 mL 0  . ZINC SULFATE 220 MG PO CAPS PEG Tube 220 mg by PEG Tube route every morning.     Marland Kitchen METOCLOPRAMIDE HCL 5 MG/5ML PO SOLN Per Tube Place 5 mg into feeding tube every 6 (six) hours.       BP 114/70  Pulse 103  Temp 98.2 F  (36.8 C) (Oral)  Resp 20  SpO2 98%  Physical Exam  Constitutional: He appears well-developed.  Cardiovascular: Normal rate.   Pulmonary/Chest: No respiratory distress.  Abdominal: There is no tenderness.       PEG tube site left upper abdomen. hole has mostly closed. Nontender.  Neurological:       She is alert and will not yes or no. Baseline contractions.    ED Course  Procedures (including critical care time)  Labs Reviewed - No data to display Ir Replc Gastro/colonic Tube Percut W/fluoro  07/28/2011  *RADIOLOGY REPORT*  Clinical Data: Quadriparesis, malnutrition, dysphagia, accidental gastrostomy removal  GASTROSTOMY CATHETER REPLACEMENT  Comparison: 05/02/2011 abdominal x-ray  Findings: Under sterile conditions, the existing percutaneous tract in the left lower quadrant was cannulated with a 5-French Kumpe catheter.  This was advanced into the stomach.  Contrast injection confirms position in the stomach.  Amplatz guide wire inserted.  18 French tract dilatation performed.  18-French balloon retention gastrostomy advanced over the catheter into the stomach.  Retention balloon inflated with 10 ml saline containing 1 ml contrast.  This was retracted against anterior gastric wall.  This was secured externally.  Contrast injection confirms position in the stomach. Gastrostomy ready for use.  IMPRESSION: Fluoroscopic replacement of an 18-French balloon retention gastrostomy ready for use  Original Report Authenticated By: Judie Petit. Ruel Favors, M.D.     1. PEG (percutaneous endoscopic gastrostomy) adjustment/replacement/removal       MDM  Patient is sent for PEG tube replacement by interventional radiology. His been replaced initial be discharged back to nursing home.        Juliet Rude. Rubin Payor, MD 07/28/11 1240

## 2011-07-28 NOTE — ED Notes (Signed)
MVH:[id number]<GE> Expected date:07/28/11<BR> Expected time: 9:38 AM<BR> Means of arrival:Ambulance<BR> Comments:<BR> Peg tube out

## 2011-08-20 ENCOUNTER — Ambulatory Visit (HOSPITAL_COMMUNITY)
Admission: RE | Admit: 2011-08-20 | Discharge: 2011-08-20 | Disposition: A | Discharge status: Home / Self Care | Payer: Medicare HMO | Source: Ambulatory Visit | Attending: Internal Medicine | Admitting: Internal Medicine

## 2011-08-20 DIAGNOSIS — Q232 Congenital mitral stenosis: Secondary | ICD-10-CM

## 2011-08-20 DIAGNOSIS — G35 Multiple sclerosis: Secondary | ICD-10-CM | POA: Insufficient documentation

## 2011-08-20 DIAGNOSIS — R1312 Dysphagia, oropharyngeal phase: Secondary | ICD-10-CM | POA: Insufficient documentation

## 2011-08-20 NOTE — Procedures (Signed)
Objective Swallowing Evaluation: Modified Barium Swallowing Study  Patient Details  Name: Terry Palmer MRN: [id number] Date of Birth: 1975-10-18  Today's Date: 08/20/2011 Time: 1100-1135 SLP Time Calculation (min): 35 min  Past Medical History:  Past Medical History  Diagnosis Date  . MS (multiple sclerosis)   . 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 Childhood asthma   . Depression   . Neuromuscular disorder     Quadraperesis  . Recurrent UTI   . Dysphagia   . Bladder calculi   . Neurogenic bladder   . Shortness of breath   . Recurrent upper respiratory infection (URI)   . Normocytic anemia 05/28/2011   Past Surgical History:  Past Surgical History  Procedure Date  . Lumbar puncture 10/12/2002  . Gastrostomy 04/16/2011    Procedure: GASTROSTOMY;  Surgeon: Liz Malady, MD;  Location: [facility] OR;  Service: General;  Laterality: N/A;  Open G-Tube placement   HPI:  36 year old male with h/o progressing MS, FTT, severe dysphagia s/p PEG tube placement. Wife present for evaluation and reports that for the past 4 days, patient has been taking a new MD med via mouth, whole in puree, secondary to inability to crush and put down PEG tube. She reports overall good tolerance of this particular pill. Current MBS ordered, per wife, primarily to ensure safety with continuation of this pill via mouth. Wife verbalizes understanding that based on patient's current condition as well as previously documented h/o dysphagia, aspiration risk high in general at this time. Most recent objective evaluation was a FEES which was completed 03/13/11 and recommended initially dysphagia 1 with thin liquids. By end of that hospitalization however, patient required honey thick liquids along with pureed solids to decrease risk of aspiration as much as possible.      Assessment / Plan / Recommendation Clinical Impression  Dysphagia Diagnosis: Severe oral phase dysphagia;Moderate pharyngeal phase  dysphagia Clinical impression: Patient continues to present with a primary and severe oral phase dysphagia with a secondary pharyngeal component. Oral phase characterized by gross weakness resulting in limited ability to transit bolus posteriorly for initiation of the swallow. After much delay, including tongue pumping, anterior spillage, and decreased bolus cohesion, patient was able to intiate a swallow with first bite of pureed solids only, swallowing without penetration/aspiration and with full pharyngeal clearance. Patient then however, due to significant fatigue, was unable to orally transit remaining trials, eventually requiring manual suctioning of oral cavity. Although no aspiration noted during today's exam, risk of aspiration remains high with all pos and abilities are likely to fluctuate on a day to day or even hour to hour basis depending on strength and alertness. Discussed with wife who verbalized understanding of aspiration risk. Defer decision to MD regarding continuation of po meds, particulary discussion regarding benefits of medications vs risk of aspiration. Overall, it appears based on wife report that patient has been tolerating meds up to this date. If po meds are to continue, recommend providing in pureed solids during most alert time of day and with assist with upright positioning.     Treatment Recommendation  Defer treatment plan to SLP at (Comment) (SNF)    Diet Recommendation NPO;Alternative means - long-term (med whole in puree if needed.)   Postural Changes and/or Swallow Maneuvers:  (ensure patient fully alert and sitting upright)    Other  Recommendations Oral Care Recommendations: Oral care QID   Follow Up Recommendations  Skilled Nursing facility (for education if patient to continue po meds)  SLP Swallow Goals     General HPI: 36 year old male with h/o progressing MS, FTT, severe dysphagia s/p PEG tube placement. Wife present for evaluation and  reports that for the past 4 days, patient has been taking a new MD med via mouth, whole in puree, secondary to inability to crush and put down PEG tube. She reports overall good tolerance of this particular pill. Current MBS ordered, per wife, primarily to ensure safety with continuation of this pill via mouth. Wife verbalizes understanding that based on patient's current condition as well as previously documented h/o dysphagia, aspiration risk high in general at this time. Most recent objective evaluation was a FEES which was completed 03/13/11 and recommended initially dysphagia 1 with thin liquids. By end of that hospitalization however, patient required honey thick liquids along with pureed solids to decrease risk of aspiration as much as possible.  Type of Study: Modified Barium Swallowing Study Reason for Referral: Objectively evaluate swallowing function Previous Swallow Assessment: see clinical impression statement Diet Prior to this Study: NPO;PEG tube (except for one medication whole in puree) Temperature Spikes Noted: No Respiratory Status: Room air History of Recent Intubation: No Behavior/Cognition: Lethargic Oral Cavity - Dentition: Adequate natural dentition Oral Motor / Sensory Function: Impaired motor Oral impairment: Right lingual;Left lingual;Right facial;Left facial;Right labial;Left labial (gross weakness, poor secretion management) Self-Feeding Abilities: Total assist Patient Positioning: Upright in chair (significant head lean downward and to the right) Baseline Vocal Quality: Aphonic Volitional Cough: Other (Comment) (unable to due poor breath support) Volitional Swallow: Able to elicit Anatomy: Other (Comment) (significant posterior curvature of cervical spine ) Pharyngeal Secretions: Not observed secondary MBS    Reason for Referral Objectively evaluate swallowing function   Oral Phase     Pharyngeal Phase Pharyngeal Phase: Impaired   Cervical Esophageal  Phase    GO   Ferdinand Lango MA, CCC-SLP [phone]  Cervical Esophageal Phase: [facility]    Terry Palmer 08/20/2011, 1:57 PM

## 2012-01-29 ENCOUNTER — Inpatient Hospital Stay (HOSPITAL_COMMUNITY)
Admission: EM | Admit: 2012-01-29 | Discharge: 2012-02-08 | DRG: 871 | Disposition: A | Discharge status: Skilled Nursing Facility | Payer: Medicare HMO | Attending: Internal Medicine | Admitting: Internal Medicine

## 2012-01-29 ENCOUNTER — Emergency Department (HOSPITAL_COMMUNITY): Payer: Medicare HMO

## 2012-01-29 ENCOUNTER — Encounter (HOSPITAL_COMMUNITY): Payer: Self-pay | Admitting: Emergency Medicine

## 2012-01-29 DIAGNOSIS — R531 Weakness: Secondary | ICD-10-CM

## 2012-01-29 DIAGNOSIS — N390 Urinary tract infection, site not specified: Secondary | ICD-10-CM | POA: Diagnosis present

## 2012-01-29 DIAGNOSIS — L89609 Pressure ulcer of unspecified heel, unspecified stage: Secondary | ICD-10-CM | POA: Diagnosis present

## 2012-01-29 DIAGNOSIS — G40909 Epilepsy, unspecified, not intractable, without status epilepticus: Secondary | ICD-10-CM | POA: Diagnosis present

## 2012-01-29 DIAGNOSIS — G92 Toxic encephalopathy: Secondary | ICD-10-CM | POA: Diagnosis present

## 2012-01-29 DIAGNOSIS — E876 Hypokalemia: Secondary | ICD-10-CM | POA: Diagnosis present

## 2012-01-29 DIAGNOSIS — M25529 Pain in unspecified elbow: Secondary | ICD-10-CM

## 2012-01-29 DIAGNOSIS — R633 Feeding difficulties: Secondary | ICD-10-CM

## 2012-01-29 DIAGNOSIS — G929 Unspecified toxic encephalopathy: Secondary | ICD-10-CM | POA: Diagnosis present

## 2012-01-29 DIAGNOSIS — N210 Calculus in bladder: Secondary | ICD-10-CM

## 2012-01-29 DIAGNOSIS — G35 Multiple sclerosis: Secondary | ICD-10-CM | POA: Diagnosis present

## 2012-01-29 DIAGNOSIS — N319 Neuromuscular dysfunction of bladder, unspecified: Secondary | ICD-10-CM | POA: Diagnosis present

## 2012-01-29 DIAGNOSIS — G8250 Quadriplegia, unspecified: Secondary | ICD-10-CM | POA: Diagnosis present

## 2012-01-29 DIAGNOSIS — D649 Anemia, unspecified: Secondary | ICD-10-CM

## 2012-01-29 DIAGNOSIS — R1310 Dysphagia, unspecified: Secondary | ICD-10-CM | POA: Diagnosis present

## 2012-01-29 DIAGNOSIS — E43 Unspecified severe protein-calorie malnutrition: Secondary | ICD-10-CM

## 2012-01-29 DIAGNOSIS — Z515 Encounter for palliative care: Secondary | ICD-10-CM

## 2012-01-29 DIAGNOSIS — E46 Unspecified protein-calorie malnutrition: Secondary | ICD-10-CM | POA: Diagnosis present

## 2012-01-29 DIAGNOSIS — A419 Sepsis, unspecified organism: Principal | ICD-10-CM | POA: Diagnosis present

## 2012-01-29 DIAGNOSIS — L8992 Pressure ulcer of unspecified site, stage 2: Secondary | ICD-10-CM | POA: Diagnosis present

## 2012-01-29 DIAGNOSIS — B964 Proteus (mirabilis) (morganii) as the cause of diseases classified elsewhere: Secondary | ICD-10-CM | POA: Diagnosis present

## 2012-01-29 DIAGNOSIS — G9340 Encephalopathy, unspecified: Secondary | ICD-10-CM

## 2012-01-29 DIAGNOSIS — B952 Enterococcus as the cause of diseases classified elsewhere: Secondary | ICD-10-CM

## 2012-01-29 DIAGNOSIS — R627 Adult failure to thrive: Secondary | ICD-10-CM

## 2012-01-29 DIAGNOSIS — Z87891 Personal history of nicotine dependence: Secondary | ICD-10-CM

## 2012-01-29 DIAGNOSIS — Z931 Gastrostomy status: Secondary | ICD-10-CM

## 2012-01-29 DIAGNOSIS — L89152 Pressure ulcer of sacral region, stage 2: Secondary | ICD-10-CM

## 2012-01-29 DIAGNOSIS — L89109 Pressure ulcer of unspecified part of back, unspecified stage: Secondary | ICD-10-CM | POA: Diagnosis present

## 2012-01-29 DIAGNOSIS — R4182 Altered mental status, unspecified: Secondary | ICD-10-CM | POA: Diagnosis present

## 2012-01-29 LAB — URINALYSIS, ROUTINE W REFLEX MICROSCOPIC
Bilirubin Urine: NEGATIVE
Ketones, ur: NEGATIVE mg/dL
Nitrite: NEGATIVE
Specific Gravity, Urine: 1.037 — ABNORMAL HIGH (ref 1.005–1.030)
Urobilinogen, UA: 0.2 mg/dL (ref 0.0–1.0)
pH: 8.5 — ABNORMAL HIGH (ref 5.0–8.0)

## 2012-01-29 LAB — COMPREHENSIVE METABOLIC PANEL
CO2: 24 mEq/L (ref 19–32)
Calcium: 9.2 mg/dL (ref 8.4–10.5)
Chloride: 97 mEq/L (ref 96–112)
Creatinine, Ser: 0.72 mg/dL (ref 0.50–1.35)
GFR calc Af Amer: >90 mL/min (ref >90)
GFR calc non Af Amer: >90 mL/min (ref >90)
Glucose, Bld: 108 mg/dL — ABNORMAL HIGH (ref 70–99)
Total Bilirubin: 0.2 mg/dL — ABNORMAL LOW (ref 0.3–1.2)

## 2012-01-29 LAB — URINE MICROSCOPIC-ADD ON

## 2012-01-29 LAB — CBC
HCT: 38.8 % — ABNORMAL LOW (ref 39.0–52.0)
HCT: 36.3 % — ABNORMAL LOW (ref 39.0–52.0)
Hemoglobin: 13.2 g/dL (ref 13.0–17.0)
MCH: 30.3 pg (ref 26.0–34.0)
MCH: 29.5 pg (ref 26.0–34.0)
MCHC: 33.6 g/dL (ref 30.0–36.0)
MCV: 89.2 fL (ref 78.0–100.0)
MCV: 87.9 fL (ref 78.0–100.0)
RBC: 4.35 MIL/uL (ref 4.22–5.81)
RDW: 13.5 % (ref 11.5–15.5)
WBC: 17.8 10*3/uL — ABNORMAL HIGH (ref 4.0–10.5)

## 2012-01-29 LAB — PROCALCITONIN: Procalcitonin: <0.1 ng/mL

## 2012-01-29 LAB — LACTIC ACID, PLASMA: Lactic Acid, Venous: 3.7 mmol/L — ABNORMAL HIGH (ref 0.5–2.2)

## 2012-01-29 LAB — INFLUENZA PANEL BY PCR (TYPE A & B): H1N1 flu by pcr: NOT DETECTED

## 2012-01-29 MED ORDER — FREE WATER
150.0000 mL | Freq: Every day | Status: DC
  Administered 2012-01-29 – 2012-02-04 (×34): 150 mL

## 2012-01-29 MED ORDER — PRO-STAT SUGAR FREE PO LIQD
60.0000 mL | Freq: Two times a day (BID) | ORAL | Status: DC
  Administered 2012-01-29 – 2012-02-08 (×20): 60 mL
  Filled 2012-01-29 (×22): qty 60

## 2012-01-29 MED ORDER — SODIUM CHLORIDE 0.9 % IV BOLUS (SEPSIS)
2000.0000 mL | Freq: Once | INTRAVENOUS | Status: AC
  Administered 2012-01-29: 2000 mL via INTRAVENOUS

## 2012-01-29 MED ORDER — POLYETHYLENE GLYCOL 3350 17 G PO PACK
17.0000 g | PACK | ORAL | Status: DC
  Administered 2012-02-06 – 2012-02-08 (×2): 17 g
  Filled 2012-01-29 (×6): qty 1

## 2012-01-29 MED ORDER — CIPROFLOXACIN IN D5W 400 MG/200ML IV SOLN
400.0000 mg | Freq: Once | INTRAVENOUS | Status: DC
  Filled 2012-01-29: qty 200

## 2012-01-29 MED ORDER — ACETAMINOPHEN 160 MG/5ML PO SOLN
975.0000 mg | Freq: Three times a day (TID) | ORAL | Status: DC
  Administered 2012-01-29 – 2012-01-30 (×2): 975 mg via ORAL
  Filled 2012-01-29 (×3): qty 40.6

## 2012-01-29 MED ORDER — CIPROFLOXACIN IN D5W 400 MG/200ML IV SOLN
400.0000 mg | Freq: Two times a day (BID) | INTRAVENOUS | Status: DC
  Administered 2012-01-29 – 2012-01-31 (×4): 400 mg via INTRAVENOUS
  Filled 2012-01-29 (×6): qty 200

## 2012-01-29 MED ORDER — ACETAMINOPHEN 650 MG RE SUPP
650.0000 mg | Freq: Once | RECTAL | Status: AC
  Administered 2012-01-29: 650 mg via RECTAL
  Filled 2012-01-29: qty 1

## 2012-01-29 MED ORDER — SODIUM CHLORIDE 0.9 % IV BOLUS (SEPSIS)
1000.0000 mL | Freq: Once | INTRAVENOUS | Status: AC
  Administered 2012-01-29: 1000 mL via INTRAVENOUS

## 2012-01-29 MED ORDER — ACETAMINOPHEN 160 MG/5ML PO SOLN
975.0000 mg | Freq: Four times a day (QID) | ORAL | Status: DC | PRN
  Administered 2012-01-29 – 2012-02-08 (×13): 975 mg
  Filled 2012-01-29 (×2): qty 20.3
  Filled 2012-01-29 (×2): qty 40.6
  Filled 2012-01-29 (×2): qty 20.3
  Filled 2012-01-29 (×2): qty 40.6
  Filled 2012-01-29: qty 20.3
  Filled 2012-01-29 (×3): qty 40.6
  Filled 2012-01-29: qty 20.3
  Filled 2012-01-29: qty 40.6
  Filled 2012-01-29: qty 20.3

## 2012-01-29 MED ORDER — ENOXAPARIN SODIUM 40 MG/0.4ML ~~LOC~~ SOLN
40.0000 mg | SUBCUTANEOUS | Status: DC
  Administered 2012-01-29 – 2012-02-07 (×10): 40 mg via SUBCUTANEOUS
  Filled 2012-01-29 (×11): qty 0.4

## 2012-01-29 MED ORDER — ONDANSETRON HCL 4 MG PO TABS: 4.0000 mg | ORAL_TABLET | Freq: Four times a day (QID) | ORAL | Status: DC | PRN

## 2012-01-29 MED ORDER — BUPROPION HCL 100 MG PO TABS
100.0000 mg | ORAL_TABLET | Freq: Three times a day (TID) | ORAL | Status: DC
  Administered 2012-01-29 – 2012-02-08 (×30): 100 mg
  Filled 2012-01-29 (×33): qty 1

## 2012-01-29 MED ORDER — GUAIFENESIN-DM 100-10 MG/5ML PO SYRP
5.0000 mL | ORAL_SOLUTION | ORAL | Status: DC | PRN
  Administered 2012-02-01: 5 mL
  Filled 2012-01-29: qty 5

## 2012-01-29 MED ORDER — ADULT MULTIVITAMIN W/MINERALS CH: 1.0000 | ORAL_TABLET | Freq: Every morning | ORAL | Status: DC

## 2012-01-29 MED ORDER — OMEGA-3-ACID ETHYL ESTERS 1 G PO CAPS
1.0000 g | ORAL_CAPSULE | Freq: Every day | ORAL | Status: DC
  Administered 2012-01-31 – 2012-02-08 (×6): 1 g
  Filled 2012-01-29 (×11): qty 1

## 2012-01-29 MED ORDER — SODIUM CHLORIDE 0.9 % IV SOLN
INTRAVENOUS | Status: DC
  Administered 2012-01-30 – 2012-01-31 (×2): 75 mL/h via INTRAVENOUS

## 2012-01-29 MED ORDER — TIZANIDINE HCL 4 MG PO TABS
4.0000 mg | ORAL_TABLET | Freq: Four times a day (QID) | ORAL | Status: DC
  Administered 2012-01-29 – 2012-02-08 (×39): 4 mg
  Filled 2012-01-29 (×49): qty 1

## 2012-01-29 MED ORDER — CIPROFLOXACIN IN D5W 400 MG/200ML IV SOLN
400.0000 mg | Freq: Once | INTRAVENOUS | Status: AC
  Administered 2012-01-29: 400 mg via INTRAVENOUS

## 2012-01-29 MED ORDER — ALBUTEROL SULFATE (5 MG/ML) 0.5% IN NEBU: 2.5000 mg | INHALATION_SOLUTION | RESPIRATORY_TRACT | Status: DC | PRN

## 2012-01-29 MED ORDER — ONDANSETRON HCL 4 MG/2ML IJ SOLN: 4.0000 mg | Freq: Four times a day (QID) | INTRAMUSCULAR | Status: DC | PRN

## 2012-01-29 MED ORDER — DEXTROSE 5 % IV SOLN
1.0000 g | Freq: Two times a day (BID) | INTRAVENOUS | Status: DC
  Administered 2012-01-29 – 2012-01-31 (×5): 1 g via INTRAVENOUS
  Filled 2012-01-29 (×8): qty 1

## 2012-01-29 MED ORDER — VANCOMYCIN HCL IN DEXTROSE 1-5 GM/200ML-% IV SOLN
1000.0000 mg | Freq: Two times a day (BID) | INTRAVENOUS | Status: DC
  Administered 2012-01-29 – 2012-01-31 (×4): 1000 mg via INTRAVENOUS
  Filled 2012-01-29 (×6): qty 200

## 2012-01-29 MED ORDER — ACETAMINOPHEN 650 MG RE SUPP: 650.0000 mg | Freq: Four times a day (QID) | RECTAL | Status: DC | PRN

## 2012-01-29 MED ORDER — OSMOLITE 1.5 CAL PO LIQD
1000.0000 mL | ORAL | Status: DC
  Administered 2012-01-30 – 2012-02-08 (×4): 1000 mL
  Filled 2012-01-29 (×18): qty 1000

## 2012-01-29 MED ORDER — ADULT MULTIVITAMIN LIQUID CH
5.0000 mL | Freq: Every day | ORAL | Status: DC
  Administered 2012-01-29 – 2012-01-30 (×2): 5 mL via ORAL
  Filled 2012-01-29 (×2): qty 5

## 2012-01-29 MED ORDER — PIPERACILLIN-TAZOBACTAM 3.375 G IVPB
3.3750 g | Freq: Once | INTRAVENOUS | Status: AC
  Administered 2012-01-29: 3.375 g via INTRAVENOUS
  Filled 2012-01-29: qty 50

## 2012-01-29 MED ORDER — SODIUM CHLORIDE 0.9 % IV SOLN
400.0000 mg | Freq: Once | INTRAVENOUS | Status: AC
  Administered 2012-01-29: 400 mg via INTRAVENOUS
  Filled 2012-01-29: qty 4

## 2012-01-29 MED ORDER — IBUPROFEN 100 MG/5ML PO SUSP
600.0000 mg | Freq: Once | ORAL | Status: AC
  Administered 2012-01-29: 600 mg
  Filled 2012-01-29: qty 30

## 2012-01-29 MED ORDER — VITAMIN C 500 MG/5ML PO SYRP
500.0000 mg | ORAL_SOLUTION | Freq: Two times a day (BID) | ORAL | Status: DC
  Administered 2012-01-29 – 2012-02-08 (×21): 500 mg
  Filled 2012-01-29 (×22): qty 5

## 2012-01-29 MED ORDER — SODIUM CHLORIDE 0.9 % IV BOLUS (SEPSIS): 2000.0000 mL | Freq: Once | INTRAVENOUS | Status: DC

## 2012-01-29 MED ORDER — VANCOMYCIN HCL IN DEXTROSE 1-5 GM/200ML-% IV SOLN
1000.0000 mg | Freq: Once | INTRAVENOUS | Status: AC
  Administered 2012-01-29: 1000 mg via INTRAVENOUS
  Filled 2012-01-29: qty 200

## 2012-01-29 MED ORDER — ACETAMINOPHEN 500 MG PO TABS: 1000.0000 mg | ORAL_TABLET | Freq: Once | ORAL | Status: DC

## 2012-01-29 MED ORDER — ACETAMINOPHEN 160 MG/5ML PO SOLN
975.0000 mg | Freq: Once | ORAL | Status: AC
  Administered 2012-01-29: 975 mg
  Filled 2012-01-29: qty 40.6

## 2012-01-29 MED ORDER — WHITE PETROLATUM GEL
Status: AC
  Administered 2012-01-29: 23:00:00
  Filled 2012-01-29: qty 5

## 2012-01-29 MED ORDER — ACETAMINOPHEN 325 MG PO TABS: 650.0000 mg | ORAL_TABLET | Freq: Four times a day (QID) | ORAL | Status: DC | PRN

## 2012-01-29 MED ORDER — LEVETIRACETAM 500 MG PO TABS
500.0000 mg | ORAL_TABLET | Freq: Two times a day (BID) | ORAL | Status: DC
  Administered 2012-01-29 – 2012-01-30 (×3): 500 mg via ORAL
  Filled 2012-01-29 (×4): qty 1

## 2012-01-29 MED ORDER — METOCLOPRAMIDE HCL 5 MG/5ML PO SOLN
5.0000 mg | Freq: Four times a day (QID) | ORAL | Status: DC
  Administered 2012-01-29 – 2012-02-08 (×40): 5 mg
  Filled 2012-01-29 (×45): qty 5

## 2012-01-29 NOTE — H&P (Signed)
PATIENT DETAILS Name: Terry Palmer Age: 37 y.o. Sex: male Date of Birth: Nov 27, 1975 Admit Date: 01/29/2012 ZOX:[id number],UJWJX A, MD   CHIEF COMPLAINT:  Altered mental status with fever  HPI: Patient is a 37 year old man with a history of disabling multiple sclerosis and resultant quadriparesis, severe dysphagia with a PEG tube in place, history of neurogenic bladder with a suprapubic catheter in place who was brought to the hospital for evaluation of the above noted complaints. Please note that this patient is a resident of a local skilled nursing facility, most of this history is obtained from the patient's mother who is at bedside. Per the patient's mother, since approximately 2 days ago patient started having fever and purulent discharge from his urine was noted in the Foley bag. He then started to have fever. Apparently a urinary analysis was done at his skilled nursing facility that was negative for UTI, his Foley catheter was changed just yesterday. Over the past 24 hours, patient started getting more lethargic, and started moaning and groaning. He was then brought to the emergency room where she was found to have persistent fever in spite of giving Tylenol and ibuprofen, he was found to have a urinary tract infection. He was found to have persistent tachycardia, but his blood pressure was stable. I was subsequently called to admit this patient for further evaluation and treatment -[name] mother she has also noted that for the past 1 day or so he has had cough and has difficulty bringing in. During my evaluation, patient just moans and groans, he is currently unable to communicate. From what I can gather from the nursing staff and from the patient's mother, there is no history of shortness of breath, nausea vomiting or diarrhea. There is no history of chest pain abdominal pain. There is no history of headaches.   ALLERGIES:  No Known Allergies  PAST MEDICAL HISTORY: Past Medical  History  Diagnosis Date  . MS (multiple sclerosis)   . 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 Childhood asthma   . Depression   . Neuromuscular disorder     Quadraperesis  . Recurrent UTI   . Dysphagia   . Bladder calculi   . Neurogenic bladder   . Shortness of breath   . Recurrent upper respiratory infection (URI)   . Normocytic anemia 05/28/2011    PAST SURGICAL HISTORY: Past Surgical History  Procedure Date  . Lumbar puncture 10/12/2002  . Gastrostomy 04/16/2011    Procedure: GASTROSTOMY;  Surgeon: Liz Malady, MD;  Location: [facility] OR;  Service: General;  Laterality: N/A;  Open G-Tube placement    MEDICATIONS AT HOME: Prior to Admission medications   Medication Sig Start Date End Date Taking? Authorizing Provider  acetaminophen (TYLENOL) 160 MG/5ML solution 160 mg by PEG Tube route every 6 (six) hours as needed. For pain.   Yes Historical Provider, MD  ascorbic acid (VITAMIN C) 500 MG/5ML syrup 500 mg by PEG Tube route 2 (two) times daily.   Yes Historical Provider, MD  buPROPion (WELLBUTRIN) 100 MG tablet 100 mg by PEG Tube route 3 (three) times daily.   Yes Historical Provider, MD  cholecalciferol (VITAMIN D) 1000 UNITS tablet 1,000 Units by PEG Tube route 2 (two) times daily.    Yes Historical Provider, MD  Cranberry 475 MG CAPS 1 capsule by PEG Tube route 2 (two) times daily.   Yes Historical Provider, MD  Cranberry-Vitamin C-Inulin (UTI-STAT) LIQD 30 mLs by PEG Tube route 2 (two) times daily.  Yes Historical Provider, MD  Fingolimod HCl (GILENYA) 0.5 MG CAPS Take 1 capsule by mouth daily.   Yes Historical Provider, MD  Garlic 500 MG CAPS 1,000 mg by PEG Tube route every morning.   Yes Historical Provider, MD  levETIRAcetam (KEPPRA) 500 MG tablet 500 mg by PEG Tube route 2 (two) times daily.   Yes Historical Provider, MD  metoCLOPramide (REGLAN) 5 MG/5ML solution Place 5 mg into feeding tube every 6 (six) hours.  05/03/11 02/28/12 Yes Belkys A Regalado, MD  Multiple  Vitamin (MULITIVITAMIN WITH MINERALS) TABS 1 tablet by PEG Tube route every morning.    Yes Historical Provider, MD  Nutritional Supplements (FEEDING SUPPLEMENT, OSMOLITE 1.5 CAL,) LIQD Place 75 mLs into feeding tube continuous.  04/19/11  Yes Army Chaco, NP  omega-3 acid ethyl esters (LOVAZA) 1 G capsule 1 g by PEG Tube route every morning.   Yes Historical Provider, MD  polyethylene glycol (MIRALAX / GLYCOLAX) packet 17 g by PEG Tube route every other day.    Yes Historical Provider, MD  tiZANidine (ZANAFLEX) 2 MG tablet 4 mg by PEG Tube route every 6 (six) hours.   Yes Historical Provider, MD  Water For Irrigation, Sterile (FREE WATER) SOLN Place 150 mLs into feeding tube 6 (six) times daily. 05/03/11   Belkys A Regalado, MD    FAMILY HISTORY: Family History  Problem Relation Age of Onset  . Asthma Mother     SOCIAL HISTORY:  reports that he quit smoking about 12 years ago. His smoking use included Cigarettes. He has quit using smokeless tobacco. He reports that he does not drink alcohol. His drug history not on file.  REVIEW OF SYSTEMS:  Constitutional:   No  weight loss, night sweats,   HEENT:    No headaches,Tooth/dental problems,Sore throat,  No sneezing, itching, ear ache, nasal congestion, post nasal drip,   Cardio-vascular: No chest pain,  Orthopnea, PND, swelling in lower extremities, anasarca,  dizziness, palpitations  GI:  No heartburn, indigestion, abdominal pain, nausea, vomiting, diarrhea, change in bowel habits, loss of appetite  Resp: No shortness of breath with exertion or at rest.  No excess mucus,   No coughing up of blood.No change in color of mucus.No wheezing.No chest wall deformity  Skin:  no rash or lesions.  GU:  no dysuria, change in color of urine, no urgency or frequency.  No flank pain.  Musculoskeletal: No joint pain or swelling.  No decreased range of motion.  No back pain.  Psych: No change in mood or affect. No depression or  anxiety.  No memory loss.   PHYSICAL EXAM: Blood pressure 112/63, pulse 119, temperature 103.3 F (39.6 C), temperature source Rectal, resp. rate 27, SpO2 95.00%.  General appearance : Flushed, lethargic, moaning and groaning. Currently unable to communicate. HEENT: Atraumatic and Normocephalic, pupils equally reactive to light and accomodation Neck: supple, no JVD. No cervical lymphadenopathy.  Chest:Good air entry bilaterally, no added sounds  CVS: S1 S2 regular, tachycardic. Abdomen: Bowel sounds present, Non tender and not distended with no gaurding, rigidity or rebound. PEG tube in place, no discharge or erythema seen around the PEG tube Extremities: B/L Lower Ext shows no edema, both legs are warm to touch. He has significant contractures of all of his 4 extremities. Neurology: As above, contracted an office for extremity Skin:No Rash Wounds: Stage 1-2 pressure ulcers present in the sacrum-these were present prior to admission  LABS ON ADMISSION:   [facility] 01/29/12 0307  NA 136  K 4.1  CL 97  CO2 24  GLUCOSE 108*  BUN 16  CREATININE 0.72  CALCIUM 9.2  MG --  PHOS --    Basename 01/29/12 0307  AST 22  ALT 37  ALKPHOS 120*  BILITOT 0.2*  PROT 7.6  ALBUMIN 3.7   No results found for this basename: LIPASE:2,AMYLASE:2 in the last 72 hours  Basename 01/29/12 0307  WBC 17.8*  NEUTROABS --  HGB 13.2  HCT 38.8*  MCV 89.2  PLT 308   No results found for this basename: CKTOTAL:3,CKMB:3,CKMBINDEX:3,TROPONINI:3 in the last 72 hours No results found for this basename: DDIMER:2 in the last 72 hours No components found with this basename: POCBNP:3   RADIOLOGIC STUDIES ON ADMISSION: Ct Abdomen Pelvis Wo Contrast  01/29/2012  *RADIOLOGY REPORT*  Clinical Data: Fever.  CT ABDOMEN AND PELVIS WITHOUT CONTRAST  Technique:  Multidetector CT imaging of the abdomen and pelvis was performed following the standard protocol without intravenous contrast.  Comparison: CT of the  abdomen and pelvis performed 03/12/2011  Findings: Minimal bibasilar atelectasis is noted.  The visualized portions of the liver and spleen are unremarkable in appearance.  The gallbladder is within normal limits.  The pancreas and adrenal glands are unremarkable.  The kidneys are unremarkable in appearance.  There is no evidence of hydronephrosis.  No renal or ureteral stones are seen.  No perinephric stranding is appreciated.  No free fluid is identified.  The small bowel is unremarkable in appearance.  The stomach is within normal limits.  A G-tube is noted ending at the antrum of the stomach.  No acute vascular abnormalities are seen.  Minimal calcification is noted along the distal abdominal aorta.  The appendix is normal in caliber, without evidence for appendicitis.  The visualized portions of the colon are unremarkable in appearance.  The bladder is decompressed, with a suprapubic catheter in place. The prostate is not well characterized.  No inguinal lymphadenopathy is seen.  There is posterior dislocation or marked subluxation of the right hip joint, new from the prior studies, with minimal associated cortical irregularity at the femoral head.  An associated joint effusion is suggested.  IMPRESSION:  1.  No definite findings seen to explain the patient's symptoms; the patient's known neurogenic bladder is decompressed and not well assessed. 2.  Posterior dislocation or marked subluxation of the right hip joint, new from the prior studies, with minimal associated cortical irregularity at the femoral head.  Associated hip joint effusion suggested.  These results were called by telephone on 01/29/2012 at 06:02 a.m. to Dr. Mendel Ryder, who verbally acknowledged these results.   Original Report Authenticated By: Tonia Ghent, M.D.    Dg Chest Port 1 View  01/29/2012  *RADIOLOGY REPORT*  Clinical Data: Fever and cough.  PORTABLE CHEST - 1 VIEW  Comparison: Chest radiograph performed 05/27/2011   Findings: The lungs are mildly hypoexpanded but appear clear. There is no evidence of focal opacification, pleural effusion or pneumothorax.  The cardiomediastinal silhouette is within normal limits.  No acute osseous abnormalities are seen.  IMPRESSION: Mildly hypoexpanded but clear lungs.   Original Report Authenticated By: Tonia Ghent, M.D.     ASSESSMENT AND PLAN: Present on Admission:  . Sepsis - Suspect source to be urinary tract infection from the history obtained and from positive urine analysis. Chest x-ray is negative for UTI, his sacral decubitus seems to be healing and is very dry without any discharge. He is persistently febrile, tachycardic and has altered mental status.  -  Has just received 1 L of bolus in the emergency room, will go ahead and bolus with 2 L of normal saline. Has received vancomycin and Zosyn here in the emergency room, add ciprofloxacin . - Currently blood pressure remained stable, he will be admitted to the step down unit, await lactate and for calcitonin levels.  - If he develops further hemodynamic instability, he needs to be transferred to the intensive care unit.  - Followup blood and urine culture results have been already collected in the ED. Await influenza PCR done in the emergency room.  . Altered mental status - Likely toxic metabolite encephalopathy - Hopefully with treatment of underlying UTI, he will come back to his usual baseline.  Marland Kitchen UTI (lower urinary tract infection) - Has a chronic suprapubic catheter, has a history of Pseudomonas UTI in the past.  - Will place him on empiric vancomycin, cefepime and ciprofloxacin.  - Upon further clinical stability, and depending on the cultures-this can be further narrowed   . Sacral decubitus ulcer, stage II - Obtain wound care consultation   . Protein calorie malnutrition - Has a chronic PEG tube in place, will get nutrition therapy services to evaluate and recommend appropriate take feedings and  nutritional supplementation   . Neurogenic bladder - Likely secondary to severe and disabling multiple sclerosis  - Is a chronic suprapubic catheter in place   . Multiple sclerosis - Is on Gilyena- per patient's mother, this can only be given via mouth. Currently patient is very lethargic and not able to have any sort of by mouth intake. For now hold off on this and resume when able   . Seizure disorder - Continue with Keppra  . Quadriparesis (muscle weakness) - All 4 extremities are contracted and weak- likely from multiple sclerosis   Further plan will depend as patient's clinical course evolves and further radiologic and laboratory data become available. Patient will be monitored closely.  DVT Prophylaxis: - Prophylactic Lovenox  Code Status: - Full code  Total time spent for admission equals 45 minutes.  [facility] Pager [phone]  If 7PM-7AM, please contact night-coverage [email or web address] Password [facility] 01/29/2012, 8:04 AM

## 2012-01-29 NOTE — Consult Note (Signed)
WOC consult Note Reason for Consult: sacral wound, however this is completely healed, has scar tissue present. No open wound of the sacrum, also noted healed pressure ulcer of the left trochanter.  Pt does present on admission with two pressure ulcers, left heel and left medial first metatarsal head.  Pt severely contracted, and only moans to my conversation today. Wound type: Stage III healing pressure ulcer of the left medial first met head DTI (deep tissue injury) of the left heel  Pressure Ulcer POA: Yes x 2 Measurement: Left lateral first met head: 0.5cm x 0.5cm x 0.2cm  Left heel: 2.5cm x 2.5cm x 0 Wound bed: Left lateral first met head: pink, 100% granulation tissue Left heel: deep, dark, maroon tissue, not open, minimal fluctuance  Drainage (amount, consistency, odor) serous from the medial left foot wound, no odor. None from the heel Periwound: all intact Dressing procedure/placement/frequency:silicone foam heel dressing and foam to the medial foot, change every 3 days and PRN. Will not add Prevalon due to contracted extremity, will need to be positioned as much as possible for offloading.  Re consult if needed, will not follow at this time. Thanks  Jaxx Huish [employer], CWOCN [phone])

## 2012-01-29 NOTE — Progress Notes (Signed)
Pt arrived from ED, barely responsive, febrile, VS otherwise stable. Will continue to monitor.

## 2012-01-29 NOTE — Progress Notes (Signed)
INITIAL NUTRITION ASSESSMENT  DOCUMENTATION CODES Per approved criteria  -Not Applicable   INTERVENTION:  Initiate TF via PEG with Osmolite 1.5 at 30 ml/h, increase by 10 ml every 4 hours to goal rate of 50 ml/h with Prostat 60 ml BID to provide 2200 kcals, 135 gm protein, 914 ml free water daily.  NUTRITION DIAGNOSIS: Inadequate oral intake related to chronic dysphagia as evidenced by receiving TF via PEG for full nutrition support.   Goal: Intake to meet >90% of estimated nutrition needs.  Monitor:  TF tolerance/adequacy, weight trend, labs, I/O  Reason for Assessment: MD Consult for TF initiation and management.  37 y.o. male  Admitting Dx: Sepsis(995.91)  ASSESSMENT: Patient was brought to the hospital for evaluation of altered mental status with fever.  He has a history of disabling multiple sclerosis and resultant quadriparesis, severe dysphagia with a PEG tube in place.  Patient is severely contracted.  Weight seems to have increased quite a bit over the past 8 months.  Per review of home meds, patient was receiving Osmolite 1.5 at 75 ml/h with MVI daily, vitamin C 500 mg BID, Reglan 5 mg QID, free water 150 ml 6 times daily.  Unsure if patient was receiving TF for 24 hours/day or just part of the day.    Height: Ht Readings from Last 1 Encounters:  01/29/12 6' 2.02" (1.88 m)    Weight: Wt Readings from Last 1 Encounters:  01/29/12 183 lb 3.2 oz (83.1 kg)    Ideal Body Weight: 78-86 kg  % Ideal Body Weight: 100%  Wt Readings from Last 10 Encounters:  01/29/12 183 lb 3.2 oz (83.1 kg)  05/31/11 145 lb 15.1 oz (66.2 kg)  04/23/11 166 lb (75.297 kg)  04/18/11 160 lb 15 oz (73 kg)  04/18/11 160 lb 15 oz (73 kg)  03/26/11 186 lb 15.2 oz (84.8 kg)  03/26/11 186 lb 15.2 oz (84.8 kg)  02/01/11 155 lb 13.8 oz (70.7 kg)  12/26/10 166 lb 14.4 oz (75.705 kg)    Usual Body Weight: 166 lb  % Usual Body Weight: 110%  BMI:  Body mass index is 23.51  kg/(m^2).  Estimated Nutritional Needs: Kcal: 2000-2200 Protein: 120-135 gm Fluid: 2 L  Skin: Stage III healing pressure ulcer of the left medial first metatarsal head; DTI (deep tissue injury) of the left heel   Diet Order: NPO  EDUCATION NEEDS: -No education needs identified at this time  No intake or output data in the 24 hours ending 01/29/12 1457  Labs:   Lab 01/29/12 0307  NA 136  K 4.1  CL 97  CO2 24  BUN 16  CREATININE 0.72  CALCIUM 9.2  MG --  PHOS --  GLUCOSE 108*    CBG (last 3)   Basename 01/29/12 0229  GLUCAP 117*    Scheduled Meds:   . ascorbic acid  500 mg Per Tube BID  . buPROPion  100 mg Per Tube TID  . ceFEPime (MAXIPIME) IV  1 g Intravenous Q12H  . ciprofloxacin  400 mg Intravenous Q12H  . enoxaparin (LOVENOX) injection  40 mg Subcutaneous Q24H  . free water  150 mL Per Tube 6 X Daily  . levETIRAcetam  500 mg Oral BID  . metoCLOPramide  5 mg Per Tube Q6H  . multivitamin  5 mL Oral Daily  . omega-3 acid ethyl esters  1 g Per Tube Daily  . polyethylene glycol  17 g Per Tube QODAY  . tiZANidine  4 mg  Per Tube Q6H  . vancomycin  1,000 mg Intravenous Q12H    Continuous Infusions:   . sodium chloride      Past Medical History  Diagnosis Date  . MS (multiple sclerosis)   . 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 Childhood asthma   . Depression   . Neuromuscular disorder     Quadraperesis  . Recurrent UTI   . Dysphagia   . Bladder calculi   . Neurogenic bladder   . Shortness of breath   . Recurrent upper respiratory infection (URI)   . Normocytic anemia 05/28/2011    Past Surgical History  Procedure Date  . Lumbar puncture 10/12/2002  . Gastrostomy 04/16/2011    Procedure: GASTROSTOMY;  Surgeon: Liz Malady, MD;  Location: [facility] OR;  Service: General;  Laterality: N/A;  Open G-Tube placement    Terry Palmer, RD, LDN, CNSC Pager# [phone] After Hours Pager# [phone]

## 2012-01-29 NOTE — Progress Notes (Signed)
ANTIBIOTIC CONSULT NOTE - INITIAL  Pharmacy Consult for Cipro Indication: rule out sepsis  No Known Allergies  Patient Measurements:   Adjusted Body Weight: n/a Per pt. Mother, he weighs about 145 lbs.   Vital Signs: Temp: 103.3 F (39.6 C) (01/21 0847) Temp src: Rectal (01/21 0847) BP: 124/57 mmHg (01/21 0830) Pulse Rate: 116  (01/21 0830) Intake/Output from previous day:   Intake/Output from this shift:    Labs:  [facility] 01/29/12 0307  WBC 17.8*  HGB 13.2  PLT 308  LABCREA --  CREATININE 0.72   The CrCl is unknown because both a height and weight (above a minimum accepted value) are required for this calculation. No results found for this basename: VANCOTROUGH:2,VANCOPEAK:2,VANCORANDOM:2,GENTTROUGH:2,GENTPEAK:2,GENTRANDOM:2,TOBRATROUGH:2,TOBRAPEAK:2,TOBRARND:2,AMIKACINPEAK:2,AMIKACINTROU:2,AMIKACIN:2, in the last 72 hours   Microbiology: No results found for this or any previous visit (from the past 720 hour(s)).  Medical History: Past Medical History  Diagnosis Date  . MS (multiple sclerosis)   . 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 Childhood asthma   . Depression   . Neuromuscular disorder     Quadraperesis  . Recurrent UTI   . Dysphagia   . Bladder calculi   . Neurogenic bladder   . Shortness of breath   . Recurrent upper respiratory infection (URI)   . Normocytic anemia 05/28/2011    Assessment: 37 yo male admitted from SNF with altered mental status and fever.  Hx multiple sclerosis, quadriparesis, chronic foley and PEG tube.  Pharmacy asked to begin empiric treatment with IV Cipro.  Initial dose of 400 mg given at 0830 AM.  Urine and blood cultures pending.  Estimated CrCl > 50; but difficult to esimate based on Scr due to quadriparesis.  Scr 0.7 seems to be close to his baseline Scr.  Goal of Therapy:  Resolution of infection  Plan:  1. Cipro 400 mg IV q 12 hrs. 2. Watch renal function closely and adjust dose as needed. 3. F/U  cultures.  Malon Siddall C 01/29/2012,8:51 AM

## 2012-01-29 NOTE — Progress Notes (Signed)
Utilization review completed.  

## 2012-01-29 NOTE — ED Notes (Signed)
Admitting called and given update on pt status.

## 2012-01-29 NOTE — ED Provider Notes (Addendum)
History     CSN: [id number]  Arrival date & time 01/29/12  0214   First MD Initiated Contact with Patient 01/29/12 0216      Chief Complaint  Patient presents with  . Fever    (Consider location/radiation/quality/duration/timing/severity/associated sxs/prior treatment) Patient is a 37 y.o. male presenting with fever. The history is provided by the EMS personnel. The history is limited by the condition of the patient.  Fever Primary symptoms of the febrile illness include fever. The current episode started today. This is a new problem. The problem has not changed since onset. Risk factors for febrile illness include implanted device and immunodeficiency.   Past Medical History  Diagnosis Date  . MS (multiple sclerosis)   . 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 Childhood asthma   . Depression   . Neuromuscular disorder     Quadraperesis  . Recurrent UTI   . Dysphagia   . Bladder calculi   . Neurogenic bladder   . Shortness of breath   . Recurrent upper respiratory infection (URI)   . Normocytic anemia 05/28/2011    Past Surgical History  Procedure Date  . Lumbar puncture 10/12/2002  . Gastrostomy 04/16/2011    Procedure: GASTROSTOMY;  Surgeon: Liz Malady, MD;  Location: [facility] OR;  Service: General;  Laterality: N/A;  Open G-Tube placement    Family History  Problem Relation Age of Onset  . Asthma Mother     History  Substance Use Topics  . Smoking status: Former Smoker    Types: Cigarettes    Quit date: 02/02/1999  . Smokeless tobacco: Former Neurosurgeon  . Alcohol Use: No      Review of Systems  Unable to perform ROS: Mental status change  Constitutional: Positive for fever.    Allergies  Review of patient's allergies indicates no known allergies.  Home Medications   Current Outpatient Rx  Name  Route  Sig  Dispense  Refill  . ACETAMINOPHEN 160 MG/5ML PO SOLN   PEG Tube   160 mg by PEG Tube route every 6 (six) hours as needed. For pain.           Marland Kitchen VITAMIN C 500 MG/5ML PO SYRP   PEG Tube   500 mg by PEG Tube route 2 (two) times daily.         . BUPROPION HCL 100 MG PO TABS   PEG Tube   100 mg by PEG Tube route 3 (three) times daily.         . CEPHALEXIN 500 MG PO CAPS   PEG Tube   500 mg by PEG Tube route 3 (three) times daily. He is taking from 07/25/11 until 07/28/11 for a urinary tract infection.. He has not completed this regimen.         Marland Kitchen VITAMIN D 1000 UNITS PO TABS   PEG Tube   1,000 Units by PEG Tube route 2 (two) times daily.          Marland Kitchen CRANBERRY 475 MG PO CAPS   PEG Tube   1 capsule by PEG Tube route 2 (two) times daily.         Marland Kitchen UTI-STAT PO LIQD   PEG Tube   30 mLs by PEG Tube route 2 (two) times daily.         Marland Kitchen DOCUSATE SODIUM 50 MG/5ML PO LIQD   PEG Tube   100 mg by PEG Tube route every morning.          Marland Kitchen  GARLIC 500 MG PO CAPS   PEG Tube   1,000 mg by PEG Tube route every morning.         Marland Kitchen HYDROCODONE-ACETAMINOPHEN 7.5-500 MG/15ML PO SOLN   PEG Tube   10 mLs by PEG Tube route every 4 (four) hours as needed. For pain.         . INTERFERON BETA-1B 0.3 MG [place] SOLR   Subcutaneous   Inject 0.3 mg into the skin every other day. He takes on even days.         Marland Kitchen LEVETIRACETAM 500 MG PO TABS   PEG Tube   500 mg by PEG Tube route 2 (two) times daily.         Marland Kitchen METOCLOPRAMIDE HCL 10 MG/10ML PO SOLN   PEG Tube   5 mg by PEG Tube route 4 (four) times daily.         Marland Kitchen METOCLOPRAMIDE HCL 5 MG/5ML PO SOLN   Per Tube   Place 5 mg into feeding tube every 6 (six) hours.          . ADULT MULTIVITAMIN W/MINERALS CH   PEG Tube   1 tablet by PEG Tube route every morning.          . OSMOLITE 1.5 CAL PO LIQD   Per Tube   Place 70 mLs into feeding tube continuous.          . OMEGA-3-ACID ETHYL ESTERS 1 G PO CAPS   PEG Tube   1 g by PEG Tube route every morning.         Marland Kitchen POLYETHYLENE GLYCOL 3350 PO PACK   PEG Tube   17 g by PEG Tube route every other day.           Marland Kitchen TIZANIDINE HCL 2 MG PO TABS   PEG Tube   4 mg by PEG Tube route every 6 (six) hours.         Marland Kitchen FREE WATER   Per Tube   Place 150 mLs into feeding tube 6 (six) times daily.   500 mL   0   . ZINC SULFATE 220 MG PO CAPS   PEG Tube   220 mg by PEG Tube route every morning.            BP 125/86  Pulse 125  Temp 104.3 F (40.2 C) (Rectal)  Resp 11  SpO2 94%  Physical Exam  HENT:  Head: Normocephalic and atraumatic.  Eyes: Conjunctivae normal and EOM are normal. Pupils are equal, round, and reactive to light.  Neck: Normal range of motion. Neck supple.  Cardiovascular: Regular rhythm.  Tachycardia present.   Pulmonary/Chest: Effort normal and breath sounds normal. No respiratory distress.  Abdominal: Soft.       Peg in place  Genitourinary: Penis normal.       Indwelling catheter present  Neurological:       Says name,  Moans to pain.  No spont movement otherwise noted  Skin:       Sacral, heel, and thigh Decubitis    ED Course  Procedures (including critical care time)  Labs Reviewed  GLUCOSE, CAPILLARY - Abnormal; Notable for the following:    Glucose-Capillary 117 (*)     All other components within normal limits  CBC  COMPREHENSIVE METABOLIC PANEL  CULTURE, BLOOD (ROUTINE X 2)  CULTURE, BLOOD (ROUTINE X 2)  URINALYSIS, ROUTINE W REFLEX MICROSCOPIC  INFLUENZA PANEL BY PCR   No results found.  No diagnosis found.  Date: 01/29/2012  Rate: 116  Rhythm: normal sinus rhythm  QRS Axis: normal  Intervals: normal  ST/T Wave abnormalities: normal  Conduction Disutrbances: none  Narrative Interpretation: unremarkable   CRITICAL CARE Performed by: Rosanne Ashing   Total critical care time:  Critical care time was exclusive of separately billable procedures and treating other patients.  Critical care was necessary to treat or prevent imminent or life-threatening deterioration.  Critical care was time spent personally by me on the  following activities: development of treatment plan with patient and/or surrogate as well as nursing, discussions with consultants, evaluation of patient's response to treatment, examination of patient, obtaining history from patient or surrogate, ordering and performing treatments and interventions, ordering and review of laboratory studies, ordering and review of radiographic studies, pulse oximetry and re-evaluation of patient's condition.  MDM  + fever,  Ams,  Decubiti.  Will labs,  Ua, culture,  Xray, reassess  + urosepsis.  Empiric abx given.  Will admit .  Cooling blanket and antipyretic for hyperthermia      Traeson Dusza Lytle Michaels, MD 01/29/12 4098  Khriz Liddy Lytle Michaels, MD 01/29/12 [phone]

## 2012-01-29 NOTE — ED Notes (Signed)
Per ems- pt from [facility]. Pt is in final stages of MS. Pt moans and growns, unable to speak but responds to his name. Unsure of baseline. Facility reports took temp and was 103. Pt has a couple bed sores stage 1/2. Pt also with catheter and PEG tube.

## 2012-01-29 NOTE — Progress Notes (Signed)
ANTIBIOTIC CONSULT NOTE - INITIAL  Pharmacy Consult: Vancomycin / Cefepime Indication:  Sepsis, possible urosepsis  No Known Allergies  Patient Measurements: Height: 6' 2.02" (188 cm) Weight: 145 lb 15.1 oz (66.2 kg) IBW/kg (Calculated) : 82.24   Vital Signs: Temp: 102.7 F (39.3 C) (01/21 1149) Temp src: Rectal (01/21 1149) BP: 122/62 mmHg (01/21 1130) Pulse Rate: 113  (01/21 1130)  Labs:  [facility] 01/29/12 0307  WBC 17.8*  HGB 13.2  PLT 308  LABCREA --  CREATININE 0.72   Estimated Creatinine Clearance: 117.2 ml/min (by C-G formula based on Cr of 0.72). No results found for this basename: VANCOTROUGH:2,VANCOPEAK:2,VANCORANDOM:2,GENTTROUGH:2,GENTPEAK:2,GENTRANDOM:2,TOBRATROUGH:2,TOBRAPEAK:2,TOBRARND:2,AMIKACINPEAK:2,AMIKACINTROU:2,AMIKACIN:2, in the last 72 hours   Microbiology: No results found for this or any previous visit (from the past 720 hour(s)).  Medical History: Past Medical History  Diagnosis Date  . MS (multiple sclerosis)   . 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 Childhood asthma   . Depression   . Neuromuscular disorder     Quadraperesis  . Recurrent UTI   . Dysphagia   . Bladder calculi   . Neurogenic bladder   . Shortness of breath   . Recurrent upper respiratory infection (URI)   . Normocytic anemia 05/28/2011       Assessment: 36 YOM from a skilled nursing home presented to the ED with AMS and fever.  Pharmacy consulted to manage vancomycin and Cefepime for sepsis, probable urosepsis.  He is also started on Cipro therapy.  Noted patient is quadriplegic, so estimated CrCL is likely falsely elevated.  He is febrile and his WBC is elevated.    Vanc 1/21 >> Cefepime 1/21 >> Cipro 1/21 >>   Goal of Therapy:  Vancomycin trough level 15-20 mcg/ml until sepsis ruled out   Plan:  - Vanc 1gm IV Q12H - Cefepime 1gm IV Q12H - Continue Cipro 400mg  IV Q12H - Monitor renal fxn, clinical course, C/S, vanc trough prior to 4th dose - F/U  VTE prophylaxis     Larosa Rhines D. Laney Potash, PharmD, BCPS Pager:  [phone] 01/29/2012, 1:02 PM

## 2012-01-30 DIAGNOSIS — G35 Multiple sclerosis: Secondary | ICD-10-CM

## 2012-01-30 DIAGNOSIS — R627 Adult failure to thrive: Secondary | ICD-10-CM

## 2012-01-30 LAB — GLUCOSE, CAPILLARY: Glucose-Capillary: 96 mg/dL (ref 70–99)

## 2012-01-30 LAB — COMPREHENSIVE METABOLIC PANEL
BUN: 11 mg/dL (ref 6–23)
CO2: 21 mEq/L (ref 19–32)
Chloride: 104 mEq/L (ref 96–112)
Creatinine, Ser: 0.51 mg/dL (ref 0.50–1.35)
GFR calc non Af Amer: >90 mL/min (ref >90)
Glucose, Bld: 114 mg/dL — ABNORMAL HIGH (ref 70–99)
Total Bilirubin: 0.4 mg/dL (ref 0.3–1.2)

## 2012-01-30 LAB — CBC
HCT: 36.4 % — ABNORMAL LOW (ref 39.0–52.0)
MCV: 86.1 fL (ref 78.0–100.0)
RBC: 4.23 MIL/uL (ref 4.22–5.81)
WBC: 10.9 10*3/uL — ABNORMAL HIGH (ref 4.0–10.5)

## 2012-01-30 LAB — LACTIC ACID, PLASMA: Lactic Acid, Venous: 1.2 mmol/L (ref 0.5–2.2)

## 2012-01-30 MED ORDER — ONDANSETRON HCL 4 MG PO TABS: 4.0000 mg | ORAL_TABLET | Freq: Four times a day (QID) | ORAL | Status: DC | PRN

## 2012-01-30 MED ORDER — CHLORHEXIDINE GLUCONATE 0.12 % MT SOLN
15.0000 mL | Freq: Two times a day (BID) | OROMUCOSAL | Status: DC
  Administered 2012-01-30 – 2012-02-07 (×16): 15 mL via OROMUCOSAL
  Filled 2012-01-30 (×13): qty 15

## 2012-01-30 MED ORDER — ADULT MULTIVITAMIN LIQUID CH
5.0000 mL | Freq: Every day | ORAL | Status: DC
  Administered 2012-01-31 – 2012-02-08 (×9): 5 mL
  Filled 2012-01-30 (×9): qty 5

## 2012-01-30 MED ORDER — BIOTENE DRY MOUTH MT LIQD
15.0000 mL | Freq: Two times a day (BID) | OROMUCOSAL | Status: DC
  Administered 2012-01-30 – 2012-02-07 (×16): 15 mL via OROMUCOSAL

## 2012-01-30 MED ORDER — LEVETIRACETAM 100 MG/ML PO SOLN
500.0000 mg | Freq: Two times a day (BID) | ORAL | Status: DC
  Administered 2012-01-30 – 2012-02-03 (×9): 500 mg
  Filled 2012-01-30 (×11): qty 5

## 2012-01-30 MED ORDER — ONDANSETRON HCL 4 MG/2ML IJ SOLN: 4.0000 mg | Freq: Four times a day (QID) | INTRAMUSCULAR | Status: DC | PRN

## 2012-01-30 MED ORDER — ARTIFICIAL TEARS OP OINT
1.0000 "application " | TOPICAL_OINTMENT | Freq: Three times a day (TID) | OPHTHALMIC | Status: DC
  Administered 2012-01-30 – 2012-02-08 (×26): 1 via OPHTHALMIC
  Filled 2012-01-30: qty 3.5

## 2012-01-30 NOTE — Progress Notes (Signed)
[facility] Progress Note [place] TEAM 1 - Stepdown/ICU TEAM   Terry Palmer ZOX:[id number] DOB: [birth year] DOA: 01/29/2012 PCP: Dorrene German, MD  Brief narrative: 37 year old man with a history of disabling multiple sclerosis and resultant quadriparesis, severe dysphagia with a PEG tube in place, history of neurogenic bladder with a suprapubic catheter in place who was brought to the hospital for evaluation of altered mental status w/ fever. Patient is a resident of a local skilled nursing facility.  Per the patient's mother, since approximately 2 days ago patient started having fever and purulent discharge from his urine was noted in the Foley bag. He then started to have fever. Apparently a urinary analysis was done at his skilled nursing facility that was "negative" for UTI.  His Foley catheter was changed 01/28/2012. Over 24 hours, patient started getting more lethargic,and started moaning and groaning. He was then brought to the emergency room where she was found to have persistent fever in spite of giving Tylenol and ibuprofen.  He was found to have a urinary tract infection. He was found to have persistent tachycardia, but his blood pressure was stable.  Assessment/Plan:  Sepsis  - Suspect source to be urinary tract infection from the history obtained and from positive urine analysis. Chest x-ray negative.  His sacral decubitus is very dry without any discharge.   - urine cx + for >100k Proteus - blood cx negative thus far  . Altered mental status  - Likely toxic metabolite encephalopathy  - Hopefully with treatment of underlying UTI, he will come back to his usual baseline - no improvement as of today   . UTI (lower urinary tract infection) - Proteus - Has a chronic suprapubic catheter - has a history of Pseudomonas UTI in the past.  - on empiric vancomycin, cefepime and ciprofloxacin.  - Upon further clinical stability, and depending on the cultures this can be  further narrowed   . Sacral decubitus ulcer, stage II  - Wound care is following - utilizing air mattress  . Protein calorie malnutrition  - Has a chronic PEG tube in place - nutrition therapy services to follow  . Neurogenic bladder  - secondary to severe and disabling multiple sclerosis  - has a chronic suprapubic catheter in place   . Multiple sclerosis  - Is on Gilyena - this can only be given via mouth - patient is very lethargic and not able to have any sort of by mouth intake - hold off on this and resume when able - discussed w/ family at bedside  . Seizure disorder  - Continue with Keppra   . Quadriparesis (muscle weakness)  - All 4 extremities are contracted from multiple sclerosis   Code Status: Full Family Communication: Spoke with mother wife and children at bedside Disposition Plan: Stepdown unit  Consultants: None  Procedures: None  Antibiotics: Maxipime 01/29/2012 Ciprofloxacin 01/29/2012 Vancomycin 01/29/2012  DVT prophylaxis: lovenox  HPI/Subjective: The patient is unresponsive.  He does moan but does so almost constantly.  He does not communicate in a meaningful way.  He does not appear to be uncomfortable.   Objective: Blood pressure 155/86, pulse 93, temperature 102.8 F (39.3 C), temperature source Axillary, resp. rate 16, height 6' 2.02" (1.88 m), weight 83.1 kg (183 lb 3.2 oz), SpO2 100.00%.  Intake/Output Summary (Last 24 hours) at 01/30/12 1534 Last data filed at 01/30/12 1219  Gross per 24 hour  Intake   3270 ml  Output   5425 ml  Net  -  2155 ml     Exam: General: No acute respiratory distress Lungs: Clear to auscultation bilaterally without wheezes or crackles Cardiovascular: Regular rate and rhythm without murmur gallop or rub normal S1 and S2 Abdomen: Nontender, nondistended, soft, bowel sounds positive, no rebound, no ascites, no appreciable mass Extremities: No significant cyanosis, clubbing, or edema bilateral lower  extremities - all extremities are flexed/contracted  Data Reviewed: Basic Metabolic Panel:  Lab 01/30/12 0981 01/29/12 1651 01/29/12 0307  NA 136 -- 136  K 3.5 -- 4.1  CL 104 -- 97  CO2 21 -- 24  GLUCOSE 114* -- 108*  BUN 11 -- 16  CREATININE 0.51 0.58 0.72  CALCIUM 8.4 -- 9.2  MG -- -- --  PHOS -- -- --   Liver Function Tests:  Lab 01/30/12 0455 01/29/12 0307  AST 46* 22  ALT 38 37  ALKPHOS 93 120*  BILITOT 0.4 0.2*  PROT 6.4 7.6  ALBUMIN 3.1* 3.7   CBC:  Lab 01/30/12 0455 01/29/12 1651 01/29/12 0307  WBC 10.9* 15.0* 17.8*  NEUTROABS -- -- --  HGB 12.6* 12.2* 13.2  HCT 36.4* 36.3* 38.8*  MCV 86.1 87.9 89.2  PLT 235 247 308   CBG:  Lab 01/30/12 0806 01/29/12 0229  GLUCAP 96 117*    Recent Results (from the past 240 hour(s))  CULTURE, BLOOD (ROUTINE X 2)     Status: Normal (Preliminary result)   Collection Time   01/29/12  3:00 AM      Component Value Range Status Comment   Specimen Description BLOOD RIGHT HAND   Final    Special Requests BOTTLES DRAWN AEROBIC ONLY 10CC   Final    Culture  Setup Time 01/29/2012 09:28   Final    Culture     Final    Value:        BLOOD CULTURE RECEIVED NO GROWTH TO DATE CULTURE WILL BE HELD FOR 5 DAYS BEFORE ISSUING A FINAL NEGATIVE REPORT   Report Status PENDING   Incomplete   CULTURE, BLOOD (ROUTINE X 2)     Status: Normal (Preliminary result)   Collection Time   01/29/12  3:10 AM      Component Value Range Status Comment   Specimen Description BLOOD LEFT HAND   Final    Special Requests BOTTLES DRAWN AEROBIC ONLY 4CC   Final    Culture  Setup Time 01/29/2012 09:28   Final    Culture     Final    Value:        BLOOD CULTURE RECEIVED NO GROWTH TO DATE CULTURE WILL BE HELD FOR 5 DAYS BEFORE ISSUING A FINAL NEGATIVE REPORT   Report Status PENDING   Incomplete   URINE CULTURE     Status: Normal (Preliminary result)   Collection Time   01/29/12  3:35 AM      Component Value Range Status Comment   Specimen Description URINE,  CATHETERIZED   Final    Special Requests CX ADDED AT 0407 ON 191478   Final    Culture  Setup Time 01/29/2012 09:31   Final    Colony Count >=100,000 COLONIES/ML   Final    Culture PROTEUS MIRABILIS   Final    Report Status PENDING   Incomplete   MRSA PCR SCREENING     Status: Normal   Collection Time   01/29/12  2:05 PM      Component Value Range Status Comment   MRSA by PCR NEGATIVE  NEGATIVE Final  Studies:  Recent x-ray studies have been reviewed in detail by the Attending Physician  Scheduled Meds:  Reviewed in detail by the Attending Physician   Lonia Blood, MD [facility] Office  [phone] Pager [phone]  On-Call/Text Page:      [email or web address]      password [email or web address]  If 7PM-7AM, please contact night-coverage [email or web address] Password [facility] 01/30/2012, 3:34 PM   LOS: 1 day

## 2012-01-30 NOTE — Clinical Social Work Psychosocial (Addendum)
Clinical Social Work Department BRIEF PSYCHOSOCIAL ASSESSMENT 01/30/2012  Patient:  Terry Palmer, Terry Palmer     Account Number:  [id number]     Admit date:  01/29/2012  Clinical Social Worker:  Lourdes Sledge  Date/Time:  01/30/2012 10:41 AM  Referred by:  RN  Date Referred:  01/30/2012 Referred for  SNF Placement   Other Referral:   Interview type:  Family Other interview type:   CSW completed assessment with pt mother Royetta Crochet [phone] who was present in pt room.    PSYCHOSOCIAL DATA Living Status:  FACILITY Admitted from facility:  [facility] Level of care:  Skilled Nursing Facility Primary support name:  Wife, Mother & Father Primary support relationship to patient:  FAMILY Degree of support available:   Pt wife, mother and father (contact information listed in EPIC) are all very actively involved in pt care. Pt family considering taking pt home at dc.    CURRENT CONCERNS Current Concerns  Post-Acute Placement   Other Concerns:   SNF vs. HH vs. Hospice/Palliative Care    SOCIAL WORK ASSESSMENT / PLAN CSW received a call from [facility] that pt was admitted from their facility and would be able to return when medically ready.    CSW visited pt room and introduced herself and role to pt mother [name] Ramsay. CSW confirmed that pt was admitted from [facility] however CSW informed that family did not want pt to return to that facility at discharge. CSW provided active listening as pt mother expressed concern with lack of care pt received at the facility. Pt mother stated she expressed concern with facility staff and the state however the care did not improve. CSW validated pt mother feelings and expressed empathy regarding disappointment mother had with care pt received. CSW explored whether family would be agreeable to exploring another SNF for pt.    Pt mother became tearful and stated she understood that pt course would only  decline from here and was unsure whether palliative or hospice would now be appropriate. CSW provided education regarding the differences and the ability to also have them in a facility or at home. CSW observed that mother and from conversation pt wife and father are unsure of the best route to go at discharge. CSW recommended that a goals of care meeting be had to discuss pt wishes and EOL. Pt mother agreed this would be helpful and stated pt wife would also be agreeable to a palliative care meeting. CSW informed mother that CSW would request a GOC meeting (CSW notified RN & included request in physician sticky note in EPIC.)    Pt wife aware that facilities may not offer placement due to not having any Medicaid beds available. Pt mother states in this instance family may consider taking pt home. Pt mother stated that in the past she was informed by [facility] that they would provide 6hrs of assistance a day. CSW informed pt mother that CSW would request RNCM to explore this further. Pt mother appreciative. At this moment disposition has not been confirmed. CSW will however do a SNF search in [facility].   Assessment/plan status:  Psychosocial Support/Ongoing Assessment of Needs Other assessment/ plan:   Information/referral to community resources:   CSW provided pt mother with a SNF list and CSW contact information. CSW will provide additional resources when appropriate. CSW has also informed RN/MD of family request for a goals of care meeting.    PATIENT'S/FAMILY'S RESPONSE TO PLAN OF CARE: Pt  laying in bed moaning however nonverbal. Assessment could not be completed with pt.    Pt mother present in the room and very involved. Pt mother very appreciative of CSW assistance and support. Pt mother tearful but presents willing to discuss EOL. Pt parents and wife involved and from conversation with mother all work together and openly discuss and decide together plan of care.  CSW will speak to wife as  she is pt main Management consultant.

## 2012-01-31 DIAGNOSIS — N319 Neuromuscular dysfunction of bladder, unspecified: Secondary | ICD-10-CM

## 2012-01-31 LAB — CBC
MCH: 29.6 pg (ref 26.0–34.0)
MCV: 84.4 fL (ref 78.0–100.0)
Platelets: 232 10*3/uL (ref 150–400)
RDW: 12.8 % (ref 11.5–15.5)

## 2012-01-31 LAB — BASIC METABOLIC PANEL
BUN: 11 mg/dL (ref 6–23)
CO2: 24 mEq/L (ref 19–32)
Calcium: 8.7 mg/dL (ref 8.4–10.5)
Creatinine, Ser: 0.53 mg/dL (ref 0.50–1.35)
GFR calc Af Amer: >90 mL/min (ref >90)

## 2012-01-31 LAB — URINE CULTURE

## 2012-01-31 LAB — GLUCOSE, CAPILLARY: Glucose-Capillary: 131 mg/dL — ABNORMAL HIGH (ref 70–99)

## 2012-01-31 MED ORDER — VANCOMYCIN HCL IN DEXTROSE 1-5 GM/200ML-% IV SOLN
1000.0000 mg | Freq: Three times a day (TID) | INTRAVENOUS | Status: DC
  Administered 2012-01-31: 1000 mg via INTRAVENOUS
  Filled 2012-01-31 (×2): qty 200

## 2012-01-31 MED ORDER — DEXTROSE 5 % IV SOLN
1.0000 g | INTRAVENOUS | Status: DC
  Administered 2012-01-31 – 2012-02-03 (×4): 1 g via INTRAVENOUS
  Filled 2012-01-31 (×6): qty 10

## 2012-01-31 NOTE — Progress Notes (Signed)
[facility] Progress Note [place] TEAM 1 - Stepdown/ICU TEAM   Terry Palmer EXB:[id number] DOB: 05/04/1975 DOA: 01/29/2012 PCP: Dorrene German, MD  Brief narrative: 37 year old man with a history of disabling multiple sclerosis and resultant quadriparesis, severe dysphagia with a PEG tube in place, history of neurogenic bladder with a suprapubic catheter in place who was brought to the hospital for evaluation of altered mental status w/ fever. Patient is a resident of a local skilled nursing facility.  Per the patient's mother, since approximately 2 days ago patient started having fever and purulent discharge from his urine was noted in the Foley bag. He then started to have fever. Apparently a urinary analysis was done at his skilled nursing facility that was "negative" for UTI.  His Foley catheter was changed 01/28/2012. Over 24 hours, patient started getting more lethargic,and started moaning and groaning. He was then brought to the emergency room where she was found to have persistent fever in spite of giving Tylenol and ibuprofen.  He was found to have a urinary tract infection. He was found to have persistent tachycardia, but his blood pressure was stable.  Assessment/Plan:  Sepsis  - Suspect source to be urinary tract infection from the history obtained and from positive urine analysis. Chest x-ray negative.  His sacral decubitus is very dry without any discharge.   - urine cx + for >100k Proteus-this is sensitive to Rocephin therefore will DC cefepime Levaquin and vancomycin and start Rocephin-will need to treat for at least 10 days - blood cx negative thus far  . Altered mental status  - Likely toxic metabolite encephalopathy  - Hopefully with treatment of underlying UTI, he will come back to his usual baseline - Apparently was slightly more awake this morning but not noted on my exam  . UTI (lower urinary tract infection) - Proteus - Has a chronic suprapubic catheter -  has a history of Pseudomonas UTI in the past.  - As mentioned above will be changing to Rocephin  . Sacral decubitus ulcer, stage II  - Wound care is following - utilizing air mattress  . Protein calorie malnutrition  - Has a chronic PEG tube in place  - nutrition therapy services to follow -tolerating tube feeds and free water flushes  . Neurogenic bladder  - secondary to severe and disabling multiple sclerosis  - has a chronic suprapubic catheter in place   . Multiple sclerosis  - Is on Gilyena - this can only be given via mouth - patient is very lethargic and not able to have any sort of by mouth intake - hold off on this and resume when able - discussed w/ family at bedside  . Seizure disorder  - Continue with Keppra   . Quadriparesis (muscle weakness)  - All 4 extremities are contracted from multiple sclerosis   Code Status: Full Family Communication: None today Disposition Plan: Transfer to med surg-of note family has been very resistant to palliative care in the past  Consultants: None  Procedures: None  Antibiotics: Maxipime 01/29/2012 Ciprofloxacin 01/29/2012 Vancomycin 01/29/2012  DVT prophylaxis: lovenox  HPI/Subjective: The patient is lethargic but on my exam but he opens eyes when his children and wife are in the room - wife tells me over the phone that he is attemptig to talk but too weak to vocalize. She does agree to talking with palliative care again.    Objective: Blood pressure 105/69, pulse 103, temperature 98.5 F (36.9 C), temperature source Axillary, resp. rate 19,  height 6' 2.02" (1.88 m), weight 83.1 kg (183 lb 3.2 oz), SpO2 100.00%.  Intake/Output Summary (Last 24 hours) at 01/31/12 1627 Last data filed at 01/31/12 1207  Gross per 24 hour  Intake 3506.25 ml  Output   4750 ml  Net -1243.75 ml     Exam: General: eyes closed, when I attempt to open them he is resisting, No acute respiratory distress Lungs: Clear to auscultation  bilaterally without wheezes or crackles Cardiovascular: Regular rate and rhythm without murmur gallop or rub normal S1 and S2 Abdomen: Nontender, nondistended, soft, bowel sounds positive, no rebound, no ascites, no appreciable mass Extremities: No significant cyanosis, clubbing, or edema bilateral lower extremities - all extremities are flexed/contracted  Data Reviewed: Basic Metabolic Panel:  Lab 01/31/12 4540 01/30/12 0455 01/29/12 1651 01/29/12 0307  NA 131* 136 -- 136  K 3.4* 3.5 -- 4.1  CL 92* 104 -- 97  CO2 24 21 -- 24  GLUCOSE 137* 114* -- 108*  BUN 11 11 -- 16  CREATININE 0.53 0.51 0.58 0.72  CALCIUM 8.7 8.4 -- 9.2  MG -- -- -- --  PHOS -- -- -- --   Liver Function Tests:  Lab 01/30/12 0455 01/29/12 0307  AST 46* 22  ALT 38 37  ALKPHOS 93 120*  BILITOT 0.4 0.2*  PROT 6.4 7.6  ALBUMIN 3.1* 3.7   CBC:  Lab 01/31/12 0645 01/30/12 0455 01/29/12 1651 01/29/12 0307  WBC 11.6* 10.9* 15.0* 17.8*  NEUTROABS -- -- -- --  HGB 12.9* 12.6* 12.2* 13.2  HCT 36.8* 36.4* 36.3* 38.8*  MCV 84.4 86.1 87.9 89.2  PLT 232 235 247 308   CBG:  Lab 01/31/12 0916 01/30/12 0806 01/29/12 0229  GLUCAP 131* 96 117*    Recent Results (from the past 240 hour(s))  CULTURE, BLOOD (ROUTINE X 2)     Status: Normal (Preliminary result)   Collection Time   01/29/12  3:00 AM      Component Value Range Status Comment   Specimen Description BLOOD RIGHT HAND   Final    Special Requests BOTTLES DRAWN AEROBIC ONLY 10CC   Final    Culture  Setup Time 01/29/2012 09:28   Final    Culture     Final    Value:        BLOOD CULTURE RECEIVED NO GROWTH TO DATE CULTURE WILL BE HELD FOR 5 DAYS BEFORE ISSUING A FINAL NEGATIVE REPORT   Report Status PENDING   Incomplete   CULTURE, BLOOD (ROUTINE X 2)     Status: Normal (Preliminary result)   Collection Time   01/29/12  3:10 AM      Component Value Range Status Comment   Specimen Description BLOOD LEFT HAND   Final    Special Requests BOTTLES DRAWN AEROBIC  ONLY 4CC   Final    Culture  Setup Time 01/29/2012 09:28   Final    Culture     Final    Value:        BLOOD CULTURE RECEIVED NO GROWTH TO DATE CULTURE WILL BE HELD FOR 5 DAYS BEFORE ISSUING A FINAL NEGATIVE REPORT   Report Status PENDING   Incomplete   URINE CULTURE     Status: Normal   Collection Time   01/29/12  3:35 AM      Component Value Range Status Comment   Specimen Description URINE, CATHETERIZED   Final    Special Requests CX ADDED AT 0407 ON [id number]   Final  Culture  Setup Time 01/29/2012 09:31   Final    Colony Count >=100,000 COLONIES/ML   Final    Culture PROTEUS MIRABILIS   Final    Report Status 01/31/2012 FINAL   Final    Organism ID, Bacteria PROTEUS MIRABILIS   Final   MRSA PCR SCREENING     Status: Normal   Collection Time   01/29/12  2:05 PM      Component Value Range Status Comment   MRSA by PCR NEGATIVE  NEGATIVE Final      Studies:  Recent x-ray studies have been reviewed in detail by the Attending Physician  Scheduled Meds:  Reviewed in detail by the Attending Physician     Calvert Cantor, MD [phone] If 7PM-7AM, please contact night-coverage [email or web address] Password [facility] 01/31/2012, 4:27 PM   LOS: 2 days

## 2012-01-31 NOTE — Progress Notes (Signed)
Pt transferred to 6N32 via bed,alert and VSS.

## 2012-01-31 NOTE — Progress Notes (Signed)
Vascular tech. Here to do lower extremity dopplers. Unable to do them because of the patient's legs severely contracture and they do not relax.

## 2012-01-31 NOTE — Progress Notes (Signed)
ANTIBIOTIC CONSULT NOTE - FOLLOW UP  Pharmacy Consult for vancomycin Indication: Sepsis, possible urosepsis   No Known Allergies  Patient Measurements: Height: 6' 2.02" (188 cm) Weight: 183 lb 3.2 oz (83.1 kg) IBW/kg (Calculated) : 82.24    Vital Signs: Temp: 98.5 F (36.9 C) (01/23 1600) Temp src: Axillary (01/23 1600) BP: 105/69 mmHg (01/23 1600) Intake/Output from previous day: 01/22 0701 - 01/23 0700 In: 3881.3 [I.V.:2581.3; IV Piggyback:450] Out: 5375 [Urine:5375] Intake/Output from this shift: Total I/O In: 325 [Other:125; IV Piggyback:200] Out: 2550 [Urine:2550]  Labs:  [facility] 01/31/12 0645 01/30/12 0455 01/29/12 1651  WBC 11.6* 10.9* 15.0*  HGB 12.9* 12.6* 12.2*  PLT 232 235 247  LABCREA -- -- --  CREATININE 0.53 0.51 0.58   Estimated Creatinine Clearance: 145.6 ml/min (by C-G formula based on Cr of 0.53).  Basename 01/31/12 1525  VANCOTROUGH 6.2*  VANCOPEAK --  Drue Dun --  GENTTROUGH --  GENTPEAK --  GENTRANDOM --  TOBRATROUGH --  TOBRAPEAK --  TOBRARND --  AMIKACINPEAK --  AMIKACINTROU --  AMIKACIN --      Assessment: 89 YOM from a skilled nursing home presented to the ED with AMS and fever. Pharmacy consulted to manage vancomycin and Cefepime for sepsis, probable urosepsis. A vancomycin trough today was 6.2 on 1000mg  IV q12h  Vanc 1/21 >> Cefepime 1/21 >> Cipro 1/21 >>  1/21 Blood - NGTD 1/21 MRSA - NEG 1/21 Urine - >100K Proteus (R to cipro, levaquin, bactrim; sens to ampicillin, rocephin)   Goal of Therapy:  Vancomycin trough level 15-20 mcg/ml  Plan:  -Change vancomycin to 1000mg  IV q8h -Could consider streamlining antibiotics to rocephin?  Harland German, Pharm D 01/31/2012 4:25 PM

## 2012-01-31 NOTE — Progress Notes (Signed)
Patient ZO:XWRUEA [name]      DOB: 1975/05/02      VWU:[id number]  Spoke with Wife [name]. Have set GOC meeting for 11 am tomorrow.  Kendrix Orman L. Ladona Ridgel, MD MBA The Palliative Medicine Team at [facility] Phone: [phone] Pager: [phone]

## 2012-01-31 NOTE — Progress Notes (Addendum)
Clinical Social Work Department CLINICAL SOCIAL WORK PLACEMENT NOTE 01/31/2012  Patient:  Terry Palmer, Terry Palmer  Account Number:  [id number] Admit date:  01/29/2012  Clinical Social Worker:  Theresia Bough, Theresia Majors  Date/time:  01/31/2012 08:42 AM  Clinical Social Work is seeking post-discharge placement for this patient at the following level of care:   SKILLED NURSING   (*CSW will update this form in Epic as items are completed)   01/31/2012  Patient/family provided with [facility] Department of Clinical Social Work's list of facilities offering this level of care within the geographic area requested by the patient (or if unable, by the patient's family).  01/31/2012  Patient/family informed of their freedom to choose among providers that offer the needed level of care, that participate in Medicare, Medicaid or managed care program needed by the patient, have an available bed and are willing to accept the patient.  01/31/2012  Patient/family informed of [facility]' ownership interest in [facility], as well as of the fact that they are under no obligation to receive care at this facility.  PASARR submitted to EDS on  PASARR number received from [employer] on EXISTING # [id number]  FL2 transmitted to all facilities in geographic area requested by pt/family on  01/31/2012 FL2 transmitted to all facilities within larger geographic area on   Patient informed that his/her managed care company has contracts with or will negotiate with  certain facilities, including the following:     Patient/family informed of bed offers received:  02-04-12 Patient chooses bed at  Physician recommends and patient chooses bed at    Patient to be transferred to  on   Patient to be transferred to facility by   The following physician request were entered in Epic:   Additional Comments:

## 2012-01-31 NOTE — Progress Notes (Signed)
Patient Terry Palmer      DOB: 31-May-1975      VWU:[id number]  Consult for Palliative Medicine consult received made contact with Dr. Butler Denmark this am.  Family in the past has appreciated our consult services but had firm beliefs that they attributed to their religious beliefs which would choose to promote life and therefore, excluded palliative end of life choices.  Dr. Butler Denmark will communicate with the family to affirm their desire to participate in goals of care.  Alyss Granato L. Ladona Ridgel, MD MBA The Palliative Medicine Team at [facility] Phone: [phone] Pager: [phone]

## 2012-02-01 ENCOUNTER — Inpatient Hospital Stay (HOSPITAL_COMMUNITY): Payer: Medicare HMO

## 2012-02-01 DIAGNOSIS — E876 Hypokalemia: Secondary | ICD-10-CM | POA: Diagnosis not present

## 2012-02-01 DIAGNOSIS — Z515 Encounter for palliative care: Secondary | ICD-10-CM

## 2012-02-01 DIAGNOSIS — A419 Sepsis, unspecified organism: Secondary | ICD-10-CM

## 2012-02-01 DIAGNOSIS — G92 Toxic encephalopathy: Secondary | ICD-10-CM | POA: Diagnosis present

## 2012-02-01 LAB — GLUCOSE, CAPILLARY: Glucose-Capillary: 113 mg/dL — ABNORMAL HIGH (ref 70–99)

## 2012-02-01 LAB — BASIC METABOLIC PANEL
BUN: 12 mg/dL (ref 6–23)
Chloride: 97 mEq/L (ref 96–112)
GFR calc Af Amer: >90 mL/min (ref >90)
Potassium: 2.8 mEq/L — ABNORMAL LOW (ref 3.5–5.1)
Sodium: 136 mEq/L (ref 135–145)

## 2012-02-01 LAB — MAGNESIUM: Magnesium: 2.2 mg/dL (ref 1.5–2.5)

## 2012-02-01 LAB — CBC
HCT: 36.3 % — ABNORMAL LOW (ref 39.0–52.0)
Hemoglobin: 12.7 g/dL — ABNORMAL LOW (ref 13.0–17.0)
MCHC: 35 g/dL (ref 30.0–36.0)
RBC: 4.24 MIL/uL (ref 4.22–5.81)

## 2012-02-01 MED ORDER — SALINE SPRAY 0.65 % NA SOLN
1.0000 | NASAL | Status: DC | PRN
  Administered 2012-02-01 – 2012-02-05 (×2): 1 via NASAL
  Filled 2012-02-01: qty 44

## 2012-02-01 MED ORDER — POTASSIUM CHLORIDE 20 MEQ/15ML (10%) PO LIQD
40.0000 meq | ORAL | Status: AC
  Administered 2012-02-01 (×2): 40 meq
  Filled 2012-02-01 (×2): qty 30

## 2012-02-01 MED ORDER — POTASSIUM CHLORIDE 20 MEQ PO PACK: 40.0000 meq | PACK | ORAL | Status: DC

## 2012-02-01 NOTE — Consult Note (Signed)
NEURO HOSPITALIST CONSULT NOTE    Reason for Consult: MS  HPI:                                                                                                                                          Terry Palmer is an 37 y.o. male who was diagnosed with MS 10 years ago and is followed as a out patient by Dr. Anne Hahn of [facility].  Patient was on Tysabri until one year ago in February he was diagnosed with JC virus and this medication was stopped. Family stated he did not take any immunomodulating medications for about three months. Over this time period patient had a significant decline in functioning.  Over the last year he has been unable to feed himself, had difficulty with speaking, and over the past three months has been communicating by mouthing words or blinking. He recently has been able to take PO medications including his Gilenya which he received just 4 days ago.  Currently patient has a UTI and is febrile. He has not been able to take PO medications for last few days, in addition, now minimally responding to external stimuli. Patient also has a 8 month history of intermittent seizure activity.  Last seizure was on Monday, day of admission. He currently is on Keppra and has had no further seizures while in the hospital.   Past Medical History  Diagnosis Date  . MS (multiple sclerosis)   . 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 Childhood asthma   . Depression   . Neuromuscular disorder     Quadraperesis  . Recurrent UTI   . Dysphagia   . Bladder calculi   . Neurogenic bladder   . Shortness of breath   . Recurrent upper respiratory infection (URI)   . Normocytic anemia 05/28/2011    Past Surgical History  Procedure Date  . Lumbar puncture 10/12/2002  . Gastrostomy 04/16/2011    Procedure: GASTROSTOMY;  Surgeon: Liz Malady, MD;  Location: [facility] OR;  Service: General;  Laterality: N/A;  Open G-Tube placement    Family History  Problem Relation Age of  Onset  . Asthma Mother      Social History:  reports that he quit smoking about 13 years ago. His smoking use included Cigarettes. He has quit using smokeless tobacco. He reports that he does not drink alcohol. His drug history not on file.  No Known Allergies  MEDICATIONS:  Scheduled:   . antiseptic oral rinse  15 mL Mouth Rinse q12n4p  . artificial tears  1 application Both Eyes Q8H  . ascorbic acid  500 mg Per Tube BID  . buPROPion  100 mg Per Tube TID  . cefTRIAXone (ROCEPHIN)  IV  1 g Intravenous Q24H  . chlorhexidine  15 mL Mouth Rinse BID  . enoxaparin (LOVENOX) injection  40 mg Subcutaneous Q24H  . feeding supplement  60 mL Per Tube BID  . free water  150 mL Per Tube 6 X Daily  . levETIRAcetam  500 mg Per Tube BID  . metoCLOPramide  5 mg Per Tube Q6H  . multivitamin  5 mL Per Tube Daily  . omega-3 acid ethyl esters  1 g Per Tube Daily  . polyethylene glycol  17 g Per Tube QODAY  . tiZANidine  4 mg Per Tube Q6H     ROS:                                                                                                                                       History obtained from mother and wife  General ROS: positive for - chills, fatigue, fever, night sweats,  Psychological ROS: negative for - behavioral disorder, hallucinations, memory difficulties, mood swings or suicidal ideation Ophthalmic ROS: negative for - blurry vision, double vision, eye pain or loss of vision ENT ROS: negative for - epistaxis, nasal discharge, oral lesions, sore throat, tinnitus or vertigo Allergy and Immunology ROS: negative for - hives or itchy/watery eyes Hematological and Lymphatic ROS: negative for - bleeding problems, bruising or swollen lymph nodes Endocrine ROS: negative for - galactorrhea, hair pattern changes, polydipsia/polyuria or temperature intolerance Respiratory ROS:  positive for - cough,shortness of breath or wheezing Cardiovascular ROS: negative for - chest pain, dyspnea on exertion, edema or irregular heartbeat Gastrointestinal ROS: negative for - abdominal pain, diarrhea, hematemesis, nausea/vomiting or stool incontinence Genito-Urinary ROS: negative for - dysuria, hematuria, incontinence or urinary frequency/urgency Musculoskeletal ROS: positive for - spastic quadriparesis  Neurological ROS: as noted in HPI Dermatological ROS: negative for rash and skin lesion changes   Blood pressure 115/63, pulse 103, temperature 100.8 F (38.2 C), temperature source Axillary, resp. rate 20, height 6' 2.02" (1.88 m), weight 83.1 kg (183 lb 3.2 oz), SpO2 96.00%.   Neurologic Examination:  Mental Status: Awake spontaneously. Will close eyes to command and open them to command.  Cranial Nerves: II: Discs flat bilaterally; does not blink to threat, pupils equal, round, reactive to light  III,IV, VI: ptosis not present, Doll's eyes intact V,VII: face symmetrical, facial light touch sensation normal bilaterally VIII: No response to verbal stimuli  IX,X: cough intact XI: bilateral shoulder shrug XII: unable to asess Motor: Bilateral UE spasticity, with forcefully flex both arms with nailbed pressure, Significant right LE spasticity and left leg is flexed at 90 degrees and crossed over his right leg.  spasticity in all 4 extremities.  Sensory: Will forcefully flex arms to nailbed pressure and triple reflex noted bilateral LE with nailbed pressure.  Deep Tendon Reflexes: 2+ throughout Plantars: Toes fan with plantar stimulation bilaterally.  Cerebellar: Unable to perform CV: pulses palpable throughout     Lab Results  Component Value Date/Time   CHOL  Value: 151        ATP III CLASSIFICATION:  <200     mg/dL   Desirable  191-478  mg/dL   Borderline High  >=295     mg/dL   High        06/28/3084  7:06 AM    Results for orders placed during the hospital encounter of 01/29/12 (from the past 48 hour(s))  CBC     Status: Abnormal   Collection Time   01/31/12  6:45 AM      Component Value Range Comment   WBC 11.6 (*) 4.0 - 10.5 K/uL    RBC 4.36  4.22 - 5.81 MIL/uL    Hemoglobin 12.9 (*) 13.0 - 17.0 g/dL    HCT 57.8 (*) 46.9 - 52.0 %    MCV 84.4  78.0 - 100.0 fL    MCH 29.6  26.0 - 34.0 pg    MCHC 35.1  30.0 - 36.0 g/dL    RDW 62.9  52.8 - 41.3 %    Platelets 232  150 - 400 K/uL   BASIC METABOLIC PANEL     Status: Abnormal   Collection Time   01/31/12  6:45 AM      Component Value Range Comment   Sodium 131 (*) 135 - 145 mEq/L    Potassium 3.4 (*) 3.5 - 5.1 mEq/L    Chloride 92 (*) 96 - 112 mEq/L    CO2 24  19 - 32 mEq/L    Glucose, Bld 137 (*) 70 - 99 mg/dL    BUN 11  6 - 23 mg/dL    Creatinine, Ser 2.44  0.50 - 1.35 mg/dL    Calcium 8.7  8.4 - 01.0 mg/dL    GFR calc non Af Amer >90  >90 mL/min    GFR calc Af Amer >90  >90 mL/min   GLUCOSE, CAPILLARY     Status: Abnormal   Collection Time   01/31/12  9:16 AM      Component Value Range Comment   Glucose-Capillary 131 (*) 70 - 99 mg/dL    Comment 1 Documented in Chart      Comment 2 Notify RN     VANCOMYCIN, [place]     Status: Abnormal   Collection Time   01/31/12  3:25 PM      Component Value Range Comment   Vancomycin Tr 6.2 (*) 10.0 - 20.0 ug/mL   BASIC METABOLIC PANEL     Status: Abnormal   Collection Time   02/01/12  5:55 AM      Component  Value Range Comment   Sodium 136  135 - 145 mEq/L    Potassium 2.8 (*) 3.5 - 5.1 mEq/L    Chloride 97  96 - 112 mEq/L    CO2 23  19 - 32 mEq/L    Glucose, Bld 145 (*) 70 - 99 mg/dL    BUN 12  6 - 23 mg/dL    Creatinine, Ser 2.13  0.50 - 1.35 mg/dL    Calcium 8.6  8.4 - 08.6 mg/dL    GFR calc non Af Amer >90  >90 mL/min    GFR calc Af Amer >90  >90 mL/min   CBC     Status: Abnormal   Collection Time   02/01/12  5:55 AM      Component Value  Range Comment   WBC 10.0  4.0 - 10.5 K/uL    RBC 4.24  4.22 - 5.81 MIL/uL    Hemoglobin 12.7 (*) 13.0 - 17.0 g/dL    HCT 57.8 (*) 46.9 - 52.0 %    MCV 85.6  78.0 - 100.0 fL    MCH 30.0  26.0 - 34.0 pg    MCHC 35.0  30.0 - 36.0 g/dL    RDW 62.9  52.8 - 41.3 %    Platelets 280  150 - 400 K/uL   GLUCOSE, CAPILLARY     Status: Abnormal   Collection Time   02/01/12  8:15 AM      Component Value Range Comment   Glucose-Capillary 113 (*) 70 - 99 mg/dL    Comment 1 Notify RN       No results found.   Assessment/Plan:  37 YO male with known MS currently on Gilenya. Currently he is suffering from UTI/sepsis.  Patient had observed seizure activity (facial twisting) on day of admission but no further seizures since admission. Breakthrough seizure likely secondary to decreased seizure threshold from infection. Over the past few days has noted to be less responsive and unable to take oral medications due to decreased mental status.  He continues to be febrile and I suspect that his infection continues to be the reason for his AMS.   Recommend: 1) EEG to confirm no further seizure activity---Continue current dose of Keppra but consider increase once mental status is improved.  2) Would continue Gilenya PO once able to take PO medications.  3) I no further improvement in next few days will obtain MRI to look for possible active MS flair, though my suspicion for this is low.    Felicie Morn PA-C [facility] [phone]  02/01/2012, 2:14 PM     I have seen and evaluated the patient. I have reviewed the above note and made appropriate changes. 37 yo M with progressive MS rapidly worsening earlier this year. The rapidity of the decline from October to March is unusual, but he had also been off treatment for a period of time around then.   Ritta Slot, MD [facility] [phone]  If 7pm- 7am, please page neurology on call at [phone].

## 2012-02-01 NOTE — Progress Notes (Signed)
[facility] Progress Note [name] TEAM 1 - Stepdown/ICU TEAM   Terry Palmer WGN:[id number] DOB: 1975/01/19 DOA: 01/29/2012 PCP: Dorrene German, MD  Brief narrative: 37 year old man with a history of disabling multiple sclerosis and resultant quadriparesis, severe dysphagia with a PEG tube in place, history of neurogenic bladder with a suprapubic catheter in place who was brought to the hospital for evaluation of altered mental status w/ fever. Patient is a resident of a local skilled nursing facility.  Per the patient's mother, since approximately 2 days ago patient started having fever and purulent discharge from his urine was noted in the Foley bag. He then started to have fever. Apparently a urinary analysis was done at his skilled nursing facility that was "negative" for UTI.  His Foley catheter was changed 01/28/2012. Over 24 hours, patient started getting more lethargic,and started moaning and groaning. He was then brought to the emergency room where she was found to have persistent fever in spite of giving Tylenol and ibuprofen.  He was found to have a urinary tract infection. He was found to have persistent tachycardia, but his blood pressure was stable.  Assessment/Plan:  Sepsis  - Suspect source to be urinary tract infection from the history obtained and from positive urine analysis. Chest x-ray negative.  His sacral decubitus is very dry without any discharge.   - urine cx + for >100k Proteus-this is sensitive to Rocephin therefore will DC cefepime Levaquin and vancomycin and start Rocephin-will need to treat for at least 10 days - blood cx negative thus far  . Altered mental status/toxic metabolite encephalopathy  - Hopefully with treatment of underlying UTI, he will come back to his usual baseline -Mental status seems to be waxing and waning. Apparently was more alert yesterday than today. Family gives history of involuntary movements of mouth and eyes PTA consistent with  prior seizure episodes. Will get EEG. Neurology consulted. Patient has not been getting any sedative medications  . UTI (lower urinary tract infection) - Proteus - Has a chronic suprapubic catheter - has a history of Pseudomonas UTI in the past.  - As mentioned above will be changing to Rocephin   Hypokalemia Replete and follow BMP   . Sacral decubitus ulcer, stage II  - Wound care is following - utilizing air mattress  . Protein calorie malnutrition  - Has a chronic PEG tube in place  - nutrition therapy services to follow -tolerating tube feeds and free water flushes  . Neurogenic bladder  - secondary to severe and disabling multiple sclerosis  - has a chronic suprapubic catheter in place   . Multiple sclerosis  - Is on Gilyena - this can only be given via mouth (verified with pharmacy on 1/24) - patient is very lethargic and not able to have any sort of by mouth intake - hold off on this and resume when able - discussed w/ family at bedside  . Seizure disorder  - Continue with Keppra. Requested EEG to rule out ongoing non-epileptiform seizure activity and need for increasing Keppra dose. Neurology consulted.   Edwina Barth (muscle weakness)  - All 4 extremities are contracted from multiple sclerosis   Code Status: Full Family Communication: Discussed with patient's mother and spouse the Disposition Plan: Not medically ready for discharge. SNF when stable  Consultants:  Palliative care team  [facility] Hospitalists  Procedures: None  Antibiotics: Maxipime 01/29/2012 Ciprofloxacin 01/29/2012 Vancomycin 01/29/2012  DVT prophylaxis: lovenox  HPI/Subjective: Patient is lethargic, barely opens eyes to call or  touch. Nonverbal. No involuntary movements noted during exam. Per family, a week prior to admission patient was alert, attempting to sing along his favorite music, recognizing family   Objective: Blood pressure 115/63, pulse 103, temperature 100.8 F (38.2  C), temperature source Axillary, resp. rate 20, height 6' 2.02" (1.88 m), weight 83.1 kg (183 lb 3.2 oz), SpO2 96.00%.  Intake/Output Summary (Last 24 hours) at 02/01/12 1449 Last data filed at 02/01/12 9562  Gross per 24 hour  Intake    870 ml  Output   2550 ml  Net  -1680 ml     Exam: General: Chronically ill looking male with extensive contractures of extremities Lungs: Clear to auscultation bilaterally without wheezes or crackles Cardiovascular: Regular rate and rhythm without murmur gallop or rub normal S1 and S2 Abdomen: Nontender, nondistended, soft, bowel sounds positive, no rebound, no ascites, no appreciable mass. PEG and suprapubic tube sites look okay. Extremities:all extremities are contracted CNS: Lethargic, drowsy, barely opens eyes to call or touch, nonverbal, drooling of saliva  Data Reviewed: Basic Metabolic Panel:  Lab 02/01/12 1308 01/31/12 0645 01/30/12 0455 01/29/12 1651 01/29/12 0307  NA 136 131* 136 -- 136  K 2.8* 3.4* 3.5 -- 4.1  CL 97 92* 104 -- 97  CO2 23 24 21  -- 24  GLUCOSE 145* 137* 114* -- 108*  BUN 12 11 11  -- 16  CREATININE 0.55 0.53 0.51 0.58 0.72  CALCIUM 8.6 8.7 8.4 -- 9.2  MG -- -- -- -- --  PHOS -- -- -- -- --   Liver Function Tests:  Lab 01/30/12 0455 01/29/12 0307  AST 46* 22  ALT 38 37  ALKPHOS 93 120*  BILITOT 0.4 0.2*  PROT 6.4 7.6  ALBUMIN 3.1* 3.7   CBC:  Lab 02/01/12 0555 01/31/12 0645 01/30/12 0455 01/29/12 1651 01/29/12 0307  WBC 10.0 11.6* 10.9* 15.0* 17.8*  NEUTROABS -- -- -- -- --  HGB 12.7* 12.9* 12.6* 12.2* 13.2  HCT 36.3* 36.8* 36.4* 36.3* 38.8*  MCV 85.6 84.4 86.1 87.9 89.2  PLT 280 232 235 247 308   CBG:  Lab 02/01/12 0815 01/31/12 0916 01/30/12 0806 01/29/12 0229  GLUCAP 113* 131* 96 117*    Recent Results (from the past 240 hour(s))  CULTURE, BLOOD (ROUTINE X 2)     Status: Normal (Preliminary result)   Collection Time   01/29/12  3:00 AM      Component Value Range Status Comment   Specimen  Description BLOOD RIGHT HAND   Final    Special Requests BOTTLES DRAWN AEROBIC ONLY 10CC   Final    Culture  Setup Time 01/29/2012 09:28   Final    Culture     Final    Value:        BLOOD CULTURE RECEIVED NO GROWTH TO DATE CULTURE WILL BE HELD FOR 5 DAYS BEFORE ISSUING A FINAL NEGATIVE REPORT   Report Status PENDING   Incomplete   CULTURE, BLOOD (ROUTINE X 2)     Status: Normal (Preliminary result)   Collection Time   01/29/12  3:10 AM      Component Value Range Status Comment   Specimen Description BLOOD LEFT HAND   Final    Special Requests BOTTLES DRAWN AEROBIC ONLY 4CC   Final    Culture  Setup Time 01/29/2012 09:28   Final    Culture     Final    Value:        BLOOD CULTURE RECEIVED NO GROWTH TO DATE  CULTURE WILL BE HELD FOR 5 DAYS BEFORE ISSUING A FINAL NEGATIVE REPORT   Report Status PENDING   Incomplete   URINE CULTURE     Status: Normal   Collection Time   01/29/12  3:35 AM      Component Value Range Status Comment   Specimen Description URINE, CATHETERIZED   Final    Special Requests CX ADDED AT 0407 ON [id number]   Final    Culture  Setup Time 01/29/2012 09:31   Final    Colony Count >=100,000 COLONIES/ML   Final    Culture PROTEUS MIRABILIS   Final    Report Status 01/31/2012 FINAL   Final    Organism ID, Bacteria PROTEUS MIRABILIS   Final   MRSA PCR SCREENING     Status: Normal   Collection Time   01/29/12  2:05 PM      Component Value Range Status Comment   MRSA by PCR NEGATIVE  NEGATIVE Final      Studies:  Recent x-ray studies have been reviewed in detail by the Attending Physician  Scheduled Meds:  Reviewed in detail by the Attending Physician     [facility] Pager: [phone] If 7PM-7AM, please contact night-coverage [email or web address] Password [email or web address] 02/01/2012, 2:49 PM   LOS: 3 days

## 2012-02-01 NOTE — Consult Note (Signed)
Patient ZO:XWRUEA ZURICH Palmer      DOB: 1975/05/11      VWU:[id number]     Consult Note from the Palliative Medicine Team at [facility]    Consult Requested by: Dr. Butler Denmark   PCP: Dorrene German, MD Reason for Consultation: GOC    Phone [phone]  Assessment of patients Current state: Terry Palmer is known to our service. He is a brave 37 year old with Multiple Sclerosis.  He has been severely debility for greater than  1 yr. His baseline good days are being awake and interacting with his family , watching tv.  He does speak slowly but had been able to intermittently let his family know when he is hungry or to laugh. He and is family value these moments and define his quality of life is defined by this as being ok and worth continuing.  He presented to the hospital with altered mental status.  He was diagnosed with a UTI which he has recurrently.  His Mother Terry Palmer and wife Terry Palmer make decisions together ( although Terry Palmer defers to Terry Palmer as his spouse). At this time, Terry Palmer and Terry Palmer desire to continue to treat with full intervention including antibiotics, CPR, Vent support if needed, aggressive wound care.   Goals of Care: 1.  Code Status: Full code   2. Scope of Treatment: 1. Vital Signs: per routine 2. Respiratory/Oxygen: as needed 3. Nutritional Support/Tube Feeds: continue tube feeds 4. Antibiotics: continue 5. IVF: as needed 6. Labs: continue as needed 7. Telemetry: would add for now 8. Consults: Agree with Neuro to see   4. Disposition: Family would like to look at another facility as they do not feel    3. Symptom Management:   1. Pain: family does desire treatment of pain if needed. Terry Palmer will cry out and moan if he is uncomfortable.  Would try to avoid opiates which will further sedate unless absolutely necessary.  Currently ,he has not been indicating pain. Tylenol is present . Call if symptoms uncontrolled 2. Bowel Regimen: Miralax present 3. Delirium: Mental status remains  depressed.  Agree with Neuro eval. 4. Fever: Tylenol 5. Nausea/Vomiting: Monitor tube feeds. Prn meds if needed  4. Psychosocial: Terry Palmer is a highly intelligent male , married to Terry Palmer with two children .  He is also supported by his mother and father.  5. Spiritual: He is Muslim in faith.     Patient Documents Completed or Given: Document Given Completed  Advanced Directives Pkt    MOST    DNR    Gone from My Sight    Hard Choices      Brief HPI: Terry Palmer is a 37 yr old with Multiple Sclerosis severely debilitated. Returns with sepsis secondary to UTI. Course complicated by persistent altered mental status and sacral decubitus   ROS:   Unable to obtain secondary to non verbal    PMH:  Past Medical History  Diagnosis Date  . MS (multiple sclerosis)   . 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 Childhood asthma   . Depression   . Neuromuscular disorder     Quadraperesis  . Recurrent UTI   . Dysphagia   . Bladder calculi   . Neurogenic bladder   . Shortness of breath   . Recurrent upper respiratory infection (URI)   . Normocytic anemia 05/28/2011     PSH: Past Surgical History  Procedure Date  . Lumbar puncture 10/12/2002  . Gastrostomy 04/16/2011    Procedure: GASTROSTOMY;  Surgeon: Liz Malady, MD;  Location: [facility] OR;  Service: General;  Laterality: N/A;  Open G-Tube placement   I have reviewed the FH and SH and  If appropriate update it with new information. No Known Allergies Scheduled Meds:   . antiseptic oral rinse  15 mL Mouth Rinse q12n4p  . artificial tears  1 application Both Eyes Q8H  . ascorbic acid  500 mg Per Tube BID  . buPROPion  100 mg Per Tube TID  . cefTRIAXone (ROCEPHIN)  IV  1 g Intravenous Q24H  . chlorhexidine  15 mL Mouth Rinse BID  . enoxaparin (LOVENOX) injection  40 mg Subcutaneous Q24H  . feeding supplement  60 mL Per Tube BID  . free water  150 mL Per Tube 6 X Daily  . levETIRAcetam  500 mg Per Tube BID  . metoCLOPramide  5 mg  Per Tube Q6H  . multivitamin  5 mL Per Tube Daily  . omega-3 acid ethyl esters  1 g Per Tube Daily  . polyethylene glycol  17 g Per Tube QODAY  . tiZANidine  4 mg Per Tube Q6H   Continuous Infusions:   . feeding supplement (OSMOLITE 1.5 CAL) 1,000 mL (02/01/12 1631)   PRN Meds:.acetaminophen (TYLENOL) oral liquid 160 mg/5 mL, albuterol, guaiFENesin-dextromethorphan, ondansetron (ZOFRAN) IV, ondansetron, sodium chloride      99.7    P: 113 R 18  152/82  96% PPS: 10 -20%   Intake/Output Summary (Last 24 hours) at 02/02/12 0741 Last data filed at 02/02/12 0532  Gross per 24 hour  Intake   2010 ml  Output   1901 ml  Net    109 ml   LBM: 1/24                         Physical Exam:  General: Obtunded but will open his eyes to loud verbal stimuli and familiar voices HEENT:  PERRL, EOM are difficult to asses as he is not tracking Chest:  Decreased, occasional rhonchi CVS: slightly tachycardic Abdomen: schaphoid, soft, PEG in place Ext: spastic quadraparesis Neuro: slight rouse to loud tactile stimuli, otherwise obtunded  Labs: CBC    Component Value Date/Time   WBC 10.0 02/01/2012 0555   RBC 4.24 02/01/2012 0555   HGB 12.7* 02/01/2012 0555   HCT 36.3* 02/01/2012 0555   PLT 280 02/01/2012 0555   MCV 85.6 02/01/2012 0555   MCH 30.0 02/01/2012 0555   MCHC 35.0 02/01/2012 0555   RDW 13.4 02/01/2012 0555   LYMPHSABS 1.8 05/27/2011 0700   MONOABS 1.6* 05/27/2011 0700   EOSABS 0.1 05/27/2011 0700   BASOSABS 0.1 05/27/2011 0700     CMP  Results for Terry Palmer, Terry Palmer (MRN [id number]) as of 02/02/2012 07:56  02/01/2012 05:55 02/01/2012 15:23  Sodium 136   Potassium 2.8 (L)   Chloride 97   CO2 23   BUN 12   Creatinine 0.55   Calcium 8.6   GFR calc non Af Amer >90   GFR calc Af Amer >90   Glucose 145 (H)   Magnesium  2.2    Chest Xray Reviewed/Impressions: hypoexpanded but clear  CT scan of theAbd Reviewed/Impressions:   IMPRESSION:  1. No definite findings seen to explain  the patient's symptoms;  the patient's known neurogenic bladder is decompressed and not well  assessed.  2. Posterior dislocation or marked subluxation of the right hip  joint, new from the prior studies, with minimal associated cortical  irregularity at the femoral head. Associated  hip joint effusion  suggested.      Time In Time Out Total Time Spent with Patient Total Overall Time  1110 am 1240 am 30 min 90 min    Greater than 50%  of this time was spent counseling and coordinating care related to the above assessment and plan.  Discussed with Dr. Granville Lewis L. Ladona Ridgel, MD MBA The Palliative Medicine Team at [facility] Phone: [phone] Pager: [phone]

## 2012-02-01 NOTE — Progress Notes (Signed)
Patient ZO:XWRUEA KYLAND NO      DOB: [birth year]      VWU:[id number]  Summary of Goals of Care,  Full note to follow:  Met with patient's wife [name] and mother Charlean Merl was admitted with sepsis from Proteus UTI.  He is slightly more somnolent today compared with yesterday.  Family states he has had a "good" last 1 year with quality of life which is at baseline intermittently awake and alert , able to watch tv with the family, and occasionally speak.  He is spastic at baseline with intermittent spasms which have increased over the last 3 months.  Family still desires Full code status,  Full treatment status and want to continue treatment for MS as soon as he is able.  Dr. Waymon Amato and I spoke with them about the decrease in mental status and I support a Neurology consult to help [place] align the goals of care being assert on Danny's behalf.  The family knows that he is at high risk for death related to the sum total of all of his conditions.  They have been offered assistance with working with their two children.  At this time we will respect their wishes to continue to aggressively treat and be supportive of his primary care giving team.  The family does not want Danny to return to the SNF that he was last in as they feel his care was suboptimal .  I will let the social work team know about their request.   Ashaya Raftery L. Ladona Ridgel, MD MBA The Palliative Medicine Team at [name] Phone: [phone] Pager: [phone]  11:10-1238 pm

## 2012-02-01 NOTE — Procedures (Signed)
History: 37 yo M with a history of   Sedation: None  Background: There is a well defined posterior dominant rhythm of 11 Hz that attenuates with eye opening. During wakefulness, the background consists of intermixed delta, alpha and theta frequencies. There is generalized delta activity throughout the study, even in maximal wakefulness. There is also an asymetry in the occipital rhythm with the right being higher amplitude than the left.   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 There was no seizure or seizure predisposition recorded on this study.   Terry Slot, MD [facility] [phone]  If 7pm- 7am, please page neurology on call at [phone].

## 2012-02-01 NOTE — Progress Notes (Signed)
Pt transferred to 6N32. [id number] CSW signing off however new unit CSW to facilitate with dc plans pending GOC meeting today.  Theresia Bough, MSW, Theresia Majors [phone]

## 2012-02-01 NOTE — Progress Notes (Signed)
PMT SW Psychosocial visit, pt somnolent, met with pt's wife and mtr who are familiar to me from previous admission in 2013. Mtr and wife report appreciation for supportive visit, were happy to report pt's relatively good quality of life over the last year. Family report continued desire to "see how he does"; family very used to pt's "surpising [place] all" and having "ups and downs". Family report good coping, plans to split shifts for visitation and report kids are doing well. Family have my contact info as needs arise, available as needed.   Terry Palmer, [place] Pager [phone] PMT Phone [phone]

## 2012-02-01 NOTE — Progress Notes (Signed)
Portable EEG completed

## 2012-02-02 LAB — BASIC METABOLIC PANEL
Chloride: 104 mEq/L (ref 96–112)
GFR calc Af Amer: >90 mL/min (ref >90)
GFR calc non Af Amer: >90 mL/min (ref >90)
Potassium: 3.4 mEq/L — ABNORMAL LOW (ref 3.5–5.1)
Sodium: 142 mEq/L (ref 135–145)

## 2012-02-02 MED ORDER — POTASSIUM CHLORIDE 20 MEQ/15ML (10%) PO LIQD
40.0000 meq | Freq: Once | ORAL | Status: AC
  Administered 2012-02-02: 40 meq
  Filled 2012-02-02: qty 30

## 2012-02-02 MED ORDER — POTASSIUM CHLORIDE 20 MEQ PO PACK: 40.0000 meq | PACK | Freq: Once | ORAL | Status: DC

## 2012-02-02 NOTE — Progress Notes (Signed)
[facility] Progress Note [place] TEAM 1 - Stepdown/ICU TEAM   Terry Palmer ZOX:[id number] DOB: 1975/09/16 DOA: 01/29/2012 PCP: Dorrene German, MD  Brief narrative: 37 year old man with a history of disabling multiple sclerosis and resultant quadriparesis, severe dysphagia with a PEG tube in place, history of neurogenic bladder with a suprapubic catheter in place who was brought to the hospital for evaluation of altered mental status w/ fever. Patient is a resident of a local skilled nursing facility.  Per the patient's mother, since approximately 2 days ago patient started having fever and purulent discharge from his urine was noted in the Foley bag. He then started to have fever. Apparently a urinary analysis was done at his skilled nursing facility that was "negative" for UTI.  His Foley catheter was changed 01/28/2012. Over 24 hours, patient started getting more lethargic,and started moaning and groaning. He was then brought to the emergency room where she was found to have persistent fever in spite of giving Tylenol and ibuprofen.  He was found to have a urinary tract infection. He was found to have persistent tachycardia, but his blood pressure was stable.  Assessment/Plan:  Sepsis  - Suspect source to be urinary tract infection from the history obtained and from positive urine analysis. Chest x-ray negative.  His sacral decubitus is very dry without any discharge.   - urine cx + for >100k Proteus-this is sensitive to Rocephin therefore will DC cefepime Levaquin and vancomycin and start Rocephin-will need to treat for at least 10 days - blood cx negative thus far. - Low grade fever 1/24. Patient however looks much this am- alert and tracking with eyes. Monitor.  . Altered mental status/toxic metabolite encephalopathy  - Hopefully with treatment of underlying UTI, he will come back to his usual baseline -Mental status seems to be waxing and waning. Family gave history of  involuntary movements of mouth and eyes PTA consistent with prior seizure episodes. Patient has not been getting any sedative medications. Alert this am. Neurology input appreciated. EEG without seizure activity. Neurology recommends ? increasing Keppra dose once MS consistently improved.  Marland Kitchen UTI (lower urinary tract infection) - Proteus - Has a chronic suprapubic catheter - has a history of Pseudomonas UTI in the past.  - As mentioned above will be changing to Rocephin. Consider changing to Ampicillin or Ceftin on D/C.   Hypokalemia Better. Replete and follow BMP   . Sacral decubitus ulcer, stage II  - Wound care is following - utilizing air mattress  . Protein calorie malnutrition  - Has a chronic PEG tube in place  - nutrition therapy services to follow -tolerating tube feeds and free water flushes  . Neurogenic bladder  - secondary to severe and disabling multiple sclerosis  - has a chronic suprapubic catheter in place   . Multiple sclerosis  - Is on Gilyena - this can only be given via mouth (verified with pharmacy on 1/24) - patient is very lethargic and not able to have any sort of by mouth intake - hold off on this and resume when able.  . Seizure disorder  - Continue with Keppra. Neurology input appreciated. EEG without seizure activity. Neurology recommends ? increasing Keppra dose once MS consistently improved.  . Quadriparesis (muscle weakness)  - All 4 extremities are contracted from multiple sclerosis   Code Status: Full. Continued aggressive care as discussed at Palliative GOC meeting on 1/24. Family Communication: Discussed with patient's mother and spouse the Disposition Plan: Not medically ready for discharge.  SNF when stable  Consultants:  Palliative care team  [facility]  Procedures: None  Antibiotics: Maxipime 01/29/2012> 1/23 Ciprofloxacin 01/29/2012>1/23 Vancomycin 01/29/2012> 1/23 Zosyn 1 dose on 1/21 Rocephin 1/23 >  DVT  prophylaxis: lovenox  HPI/Subjective:  This am- alert and tracking with eyes. Per nursing, low grade fever yesterday.    Objective: Blood pressure 123/78, pulse 102, temperature 99.1 F (37.3 C), temperature source Axillary, resp. rate 16, height 6' 2.02" (1.88 m), weight 83.1 kg (183 lb 3.2 oz), SpO2 97.00%.  Intake/Output Summary (Last 24 hours) at 02/02/12 1109 Last data filed at 02/02/12 0532  Gross per 24 hour  Intake   2010 ml  Output   1901 ml  Net    109 ml     Exam: General: Chronically ill looking male with extensive contractures of extremities Lungs: Clear to auscultation bilaterally without wheezes or crackles Cardiovascular: Regular rate and rhythm without murmur gallop or rub normal S1 and S2 Abdomen: Nontender, nondistended, soft, bowel sounds positive, no rebound, no ascites, no appreciable mass. PEG and suprapubic tube sites look okay. Extremities:all extremities are contracted CNS: alert and tracking with eyes, nonverbal,   Data Reviewed: Basic Metabolic Panel:  Lab 02/02/12 7846 02/01/12 1523 02/01/12 0555 01/31/12 0645 01/30/12 0455 01/29/12 1651 01/29/12 0307  NA 142 -- 136 131* 136 -- 136  K 3.4* -- 2.8* 3.4* 3.5 -- 4.1  CL 104 -- 97 92* 104 -- 97  CO2 27 -- 23 24 21  -- 24  GLUCOSE 106* -- 145* 137* 114* -- 108*  BUN 15 -- 12 11 11  -- 16  CREATININE 0.55 -- 0.55 0.53 0.51 0.58 --  CALCIUM 9.1 -- 8.6 8.7 8.4 -- 9.2  MG -- 2.2 -- -- -- -- --  PHOS -- -- -- -- -- -- --   Liver Function Tests:  Lab 01/30/12 0455 01/29/12 0307  AST 46* 22  ALT 38 37  ALKPHOS 93 120*  BILITOT 0.4 0.2*  PROT 6.4 7.6  ALBUMIN 3.1* 3.7   CBC:  Lab 02/01/12 0555 01/31/12 0645 01/30/12 0455 01/29/12 1651 01/29/12 0307  WBC 10.0 11.6* 10.9* 15.0* 17.8*  NEUTROABS -- -- -- -- --  HGB 12.7* 12.9* 12.6* 12.2* 13.2  HCT 36.3* 36.8* 36.4* 36.3* 38.8*  MCV 85.6 84.4 86.1 87.9 89.2  PLT 280 232 235 247 308   CBG:  Lab 02/01/12 0815 01/31/12 0916 01/30/12 0806  01/29/12 0229  GLUCAP 113* 131* 96 117*    Recent Results (from the past 240 hour(s))  CULTURE, BLOOD (ROUTINE X 2)     Status: Normal (Preliminary result)   Collection Time   01/29/12  3:00 AM      Component Value Range Status Comment   Specimen Description BLOOD RIGHT HAND   Final    Special Requests BOTTLES DRAWN AEROBIC ONLY 10CC   Final    Culture  Setup Time 01/29/2012 09:28   Final    Culture     Final    Value:        BLOOD CULTURE RECEIVED NO GROWTH TO DATE CULTURE WILL BE HELD FOR 5 DAYS BEFORE ISSUING A FINAL NEGATIVE REPORT   Report Status PENDING   Incomplete   CULTURE, BLOOD (ROUTINE X 2)     Status: Normal (Preliminary result)   Collection Time   01/29/12  3:10 AM      Component Value Range Status Comment   Specimen Description BLOOD LEFT HAND   Final    Special Requests BOTTLES  DRAWN AEROBIC ONLY 4CC   Final    Culture  Setup Time 01/29/2012 09:28   Final    Culture     Final    Value:        BLOOD CULTURE RECEIVED NO GROWTH TO DATE CULTURE WILL BE HELD FOR 5 DAYS BEFORE ISSUING A FINAL NEGATIVE REPORT   Report Status PENDING   Incomplete   URINE CULTURE     Status: Normal   Collection Time   01/29/12  3:35 AM      Component Value Range Status Comment   Specimen Description URINE, CATHETERIZED   Final    Special Requests CX ADDED AT 0407 ON [id number]   Final    Culture  Setup Time 01/29/2012 09:31   Final    Colony Count >=100,000 COLONIES/ML   Final    Culture PROTEUS MIRABILIS   Final    Report Status 01/31/2012 FINAL   Final    Organism ID, Bacteria PROTEUS MIRABILIS   Final   MRSA PCR SCREENING     Status: Normal   Collection Time   01/29/12  2:05 PM      Component Value Range Status Comment   MRSA by PCR NEGATIVE  NEGATIVE Final      Studies:  Recent x-ray studies have been reviewed in detail by the Attending Physician  Scheduled Meds:  Reviewed in detail by the Attending Physician     [facility] Pager: [phone] If 7PM-7AM, please contact  night-coverage [email or web address] Password [email or web address] 02/02/2012, 11:09 AM   LOS: 4 days

## 2012-02-02 NOTE — Evaluation (Signed)
Clinical/Bedside Swallow Evaluation Patient Details  Name: Terry Palmer MRN: [id number] Date of Birth: [birth year]  Today's Date: 02/02/2012 Time: 9528-4132 SLP Time Calculation (min): 130 min  Past Medical History:  Past Medical History  Diagnosis Date  . MS (multiple sclerosis)   . 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 Childhood asthma   . Depression   . Neuromuscular disorder     Quadraperesis  . Recurrent UTI   . Dysphagia   . Bladder calculi   . Neurogenic bladder   . Shortness of breath   . Recurrent upper respiratory infection (URI)   . Normocytic anemia 05/28/2011   Past Surgical History:  Past Surgical History  Procedure Date  . Lumbar puncture 10/12/2002  . Gastrostomy 04/16/2011    Procedure: GASTROSTOMY;  Surgeon: Liz Malady, MD;  Location: [facility] OR;  Service: General;  Laterality: N/A;  Open G-Tube placement   HPI:  37 year old man with a history of disabling multiple sclerosis and resultant quadriparesis, severe dysphagia with a PEG tube in place, history of neurogenic bladder with a suprapubic catheter in place who was brought to the hospital for evaluation of altered mental status w/ fever.  Dx UTI.  Pt with somnolence at admission.  Today with improved mental status; 70% of usual function per his mother.  Pt is well-known to SLP services from prior admissions.  Most recent evaluation was 08/20/11 - pt underwent an OPMBS, with recs to continue oral meds but deliver primary nutrition enterally.  Pt has a severe oral and moderate pharyngeal dysphagia.  Swallow eval ordered to determine if pt is safe to resume PO med, Gilenya.     Assessment / Plan / Recommendation Clinical Impression  Pt assessed for potential to resume oral med, Gilenya, given change in mental status associated with UTI.  With RN assisting in positioning and oral suctioning available, trial of puree was administered with limited success - pt moaning, no purposeful mastication was observed,  and no swallow was triggered.    Bolus was suctioned from oral cavity.    Phoned Terry Palmer's mother, Terry Palmer - she verbalized concern re: his inability to take oral med and the rapid impact its absence may have on Terry Palmer's overall functioning.  She states further that his swallowing/speaking ability is generally better in the mornings.  SLP arranged to meet Terry Palmer next date at 10:00 am to maximize opportunity for success.      Aspiration Risk  Severe    Diet Recommendation NPO        Other  Recommendations Oral Care Recommendations: Oral care QID Other Recommendations: Have oral suction available   Follow Up Recommendations       Frequency and Duration min 1 x/week  1 week   Pertinent Vitals/Pain Moans with repositioning; unable to verbalize pain level.      SLP Swallow Goals Goal #3: Pt will initiate oral propulsion of puree bolus and trigger a swallow response with max assist.   Swallow Study Prior Functional Status       General HPI: 37 year old man with a history of disabling multiple sclerosis and resultant quadriparesis, severe dysphagia with a PEG tube in place, history of neurogenic bladder with a suprapubic catheter in place who was brought to the hospital for evaluation of altered mental status w/ fever.  Dx UTI.  Pt with somnolence at admission.  Today with improved mental status.  Pt is well-known to SLP services from prior admissions.  Most recent evaluation was 08/20/11 - pt underwent  an OPMBS, with recs to continue oral meds but deliver primary nutrition enterally.  Pt has a severe oral and moderate pharyngeal dysphagia.  Swallow eval ordered to determine if pt is safe to resume PO med, Gilenya.   Type of Study: Bedside swallow evaluation Previous Swallow Assessment: MBS 08/20/11 Diet Prior to this Study: NPO Temperature Spikes Noted: Yes Respiratory Status: Room air Behavior/Cognition: Alert Oral Cavity - Dentition: Adequate natural dentition Self-Feeding  Abilities: Total assist Patient Positioning: Partially reclined Baseline Vocal Quality: Clear Volitional Cough: Cognitively unable to elicit Volitional Swallow: Unable to elicit    Oral/Motor/Sensory Function Overall Oral Motor/Sensory Function: Impaired (able to achieve limited tongue extension)   Ice Chips Ice chips: Not tested   Thin Liquid Thin Liquid: Not tested    Nectar Thick Nectar Thick Liquid: Not tested   Honey Thick Honey Thick Liquid: Not tested   Puree Puree: Impaired Presentation: Spoon Oral Phase Impairments: Reduced labial seal;Reduced lingual movement/coordination;Impaired anterior to posterior transit;Impaired mastication Oral Phase Functional Implications: Oral residue   Solid   GO    Solid: Not tested      Terry Palmer, [place] CCC/SLP Pager [phone]  Terry Palmer 02/02/2012,5:03 PM

## 2012-02-02 NOTE — Progress Notes (Signed)
Subjective: Mother reports he seems improved today. No sz or further fevers.   Exam: Filed Vitals:   02/02/12 1445  BP: 121/78  Pulse: 101  Temp: 98.9 F (37.2 C)  Resp: 17   Gen: In bed, NAD MS: Awake, appears to regard examiner though could be orienting to voice. , follows commands to stick out tongue and blink eyes.  ZO:XWRUE, cross midlines in both directions.  Motor: Severe spastic quadriparesis.  Sensory:responds to stim x 4 .  Impression: 37 yo M with severe MS and worsened mental status in the setting of infection. He has improved today and EEG was negative for ongoing sz yesterday.   Recommendations: 1) Continue keppra 500mg  BID 2) Could have SLP eval for ability to swallow since he is following commands.   Ritta Slot, MD [facility] [phone]  If 7pm- 7am, please page neurology on call at [phone].

## 2012-02-02 NOTE — Progress Notes (Signed)
Patient AV:[id number] ZAVIYAR RAHAL      DOB: 1975-08-02      BJY:[id number]   Palliative Medicine Team at [facility] Progress Note    Subjective: [name] is more awake and alert this morning.  During my visit he opened his eyes without stimulation and looked around.  His mother is at the bedside. Noted temperature spike yesterday.    Filed Vitals:   02/02/12 1037  BP: 123/78  Pulse: 102  Temp: 99.1 F (37.3 C)  Resp: 16   Physical exam:  General: No acute distress, more awake today PERRL, EOMI, anicteric Chest: decreased but clear CVS: tachycardic,S1, S2 Abd: schaphoid, soft, spasmotic with palpation Ext: all four extremities are spastic Neuro: more awake and alert, but very fatigued.  Not back to full baseline yet  Lab Results  Component Value Date   WBC 10.0 02/01/2012   HGB 12.7* 02/01/2012   HCT 36.3* 02/01/2012   MCV 85.6 02/01/2012   PLT 280 02/01/2012    Lab Results  Component Value Date   CREATININE 0.55 02/02/2012   BUN 15 02/02/2012   NA 142 02/02/2012   K 3.4* 02/02/2012   CL 104 02/02/2012   CO2 27 02/02/2012      Assessment and plan: 37 year old white male with advanced Multiple sclerosis.  Admitted with sepsis secondary to UTI.  Course complicated by history of seizures, and worsening spasticity of MS medication.    1.  Full Code  2.  Resume MS medication once able to safely swallow. Speech to check safety of po  3. UTI - continue treatment.  4. Decubitus on sacrum and heel.  On mattress overlay.  Local care continues  5 Disposition : back to SNF.  Family requesting change in location.   Total time 15 min  Jacey Eckerson L. Ladona Ridgel, MD MBA The Palliative Medicine Team at [facility] Phone: [phone] Pager: [phone]

## 2012-02-03 LAB — CBC
HCT: 36.6 % — ABNORMAL LOW (ref 39.0–52.0)
MCV: 88.2 fL (ref 78.0–100.0)
RBC: 4.15 MIL/uL — ABNORMAL LOW (ref 4.22–5.81)
RDW: 14.2 % (ref 11.5–15.5)
WBC: 7.6 10*3/uL (ref 4.0–10.5)

## 2012-02-03 LAB — BASIC METABOLIC PANEL
CO2: 24 mEq/L (ref 19–32)
Chloride: 100 mEq/L (ref 96–112)
GFR calc Af Amer: >90 mL/min (ref >90)
Potassium: 3.7 mEq/L (ref 3.5–5.1)
Sodium: 136 mEq/L (ref 135–145)

## 2012-02-03 MED ORDER — FINGOLIMOD HCL 0.5 MG PO CAPS: 1.0000 | ORAL_CAPSULE | Freq: Every day | ORAL | Status: DC

## 2012-02-03 NOTE — Progress Notes (Signed)
Speech Language Pathology Dysphagia Treatment Patient Details Name: Terry Palmer MRN: [id number] DOB: 05-04-1975 Today's Date: 02/03/2012 Time: 3474-2595 SLP Time Calculation (min): 30 min  Assessment / Plan / Recommendation Clinical Impression  F/u after yesterday's swallow assessment.  Met with pt's mother.  Pt alert, following some oral-motor commands.  Per mother, in the morning when more alert, pt is generally able to achieve two functional swallows that allow him to consume Ginelya orally.  Today, with proper positioning, pt able to swallow two boluses of applesauce with palpable laryngeal elevation.  The third bolus had to be orally suctioned, as pt fatigued quickly.     Recommend: resume administration of Ginelya orally;  give med in a.m. when pt most alert;  provide vigilant oral care.    Discussed with mother; no further SLP services warranted.  Will sign off.       Diet Recommendation  Continue with Current Diet: NPO    SLP Plan All goals met       Swallowing Goals  SLP Swallowing Goals Goal #3: Pt will initiate oral propulsion of puree bolus and trigger a swallow response with max assist. Swallow Study Goal #3 - Progress: Met  General Respiratory Status: Room air Behavior/Cognition: Alert Oral Cavity - Dentition: Adequate natural dentition Patient Positioning: Upright in bed  Oral Cavity - Oral Hygiene Does patient have any of the following "at risk" factors?: Nutritional status - inadequate;Oxygen therapy - cannula, mask, simple oxygen devices Patient is HIGH RISK - Oral Care Protocol followed (see row info): Yes   Dysphagia Treatment Treatment focused on: Facilitation of oral preparatory phase;Facilitation of pharyngeal phase Treatment Methods/Modalities: Skilled observation Patient observed directly with PO's: Yes Type of PO's observed: Dysphagia 1 (puree) Feeding: Total assist Oral Phase Signs & Symptoms: Prolonged oral phase Type of cueing:  Verbal;Tactile Amount of cueing: Total   GO    Terry Palmer, [place] CCC/SLP Pager [phone]  Terry Palmer 02/03/2012, 10:47 AM

## 2012-02-03 NOTE — Progress Notes (Signed)
Subjective: Mother reported to nursing earlier that he seemed to improve. He seems slightly more somnolent to me, but he apparently does better in the mornings.   Exam: Filed Vitals:   02/03/12 1406  BP: 128/84  Pulse: 101  Temp: 98.8 F (37.1 C)  Resp: 17   Gen: In bed, NAD MS: Awake, follows commands to stick out tongue and blink eyes.  NW:GNFAO, cross midlines in both directions.  Motor: Severe spastic quadriparesis.  Sensory:responds to stim x 4 with posturing(baseline).  Impression: 37 yo M with severe MS and worsened mental status in the setting of infection. I feel that with his continued improvement, we can attribute his MS to infection and do not feel strongly about reimaging him currently. He did have a breakthrough seizure prior to admission on the current keppra dose and will increase this slightly.   Given that he has remained afebrile for two days and his WBC has come down, I feel it would be reasonable to restart the gilenya.   Recommendations: 1) Increase keppra to 750mg  BID.  2) restart gilenya 3) Neurology will sign off at this time. Please call with any further questions.   Ritta Slot, MD [facility] [phone]  If 7pm- 7am, please page neurology on call at [phone].

## 2012-02-03 NOTE — Progress Notes (Signed)
[facility] Progress Note [place] TEAM 1 - Stepdown/ICU TEAM   Terry Palmer RUE:[id number] DOB: 1975/12/11 DOA: 01/29/2012 PCP: Dorrene German, MD  Brief narrative: 37 year old man with a history of disabling multiple sclerosis and resultant quadriparesis, severe dysphagia with a PEG tube in place, history of neurogenic bladder with a suprapubic catheter in place who was brought to the hospital for evaluation of altered mental status w/ fever. Patient is a resident of a local skilled nursing facility.  Per the patient's mother, since approximately 2 days ago patient started having fever and purulent discharge from his urine was noted in the Foley bag. He then started to have fever. Apparently a urinary analysis was done at his skilled nursing facility that was "negative" for UTI.  His Foley catheter was changed 01/28/2012. Over 24 hours, patient started getting more lethargic,and started moaning and groaning. He was then brought to the emergency room where she was found to have persistent fever in spite of giving Tylenol and ibuprofen.  He was found to have a urinary tract infection. He was found to have persistent tachycardia, but his blood pressure was stable.  Assessment/Plan:  Sepsis  - Suspect source to be urinary tract infection from the history obtained and from positive urine analysis. Chest x-ray negative.  His sacral decubitus is very dry without any discharge.   - urine cx + for >100k Proteus-this is sensitive to Rocephin therefore will DC cefepime Levaquin and vancomycin and start Rocephin-will need to treat for at least 10 days - blood cx negative thus far. - Low grade fever 1/24. Patient however looks much this am- alert and tracking with eyes. Monitor.  . Altered mental status/toxic metabolite encephalopathy  - Hopefully with treatment of underlying UTI, he will come back to his usual baseline -Mental status seems to be waxing and waning. Family gave history of  involuntary movements of mouth and eyes PTA consistent with prior seizure episodes. Patient has not been getting any sedative medications. Alert this am. Neurology input appreciated. EEG without seizure activity. Neurology recommends continuing current dose of Keppra.  Marland Kitchen UTI (lower urinary tract infection) - Proteus - Has a chronic suprapubic catheter - has a history of Pseudomonas UTI in the past.  - As mentioned above will be changing to Rocephin. Consider changing to Ampicillin or Ceftin via PEG on D/C.   Hypokalemia Repleted  . Sacral decubitus ulcer, stage II  - Wound care is following - utilizing air mattress  . Protein calorie malnutrition  - Has a chronic PEG tube in place  - nutrition therapy services to follow -tolerating tube feeds and free water flushes  . Neurogenic bladder  - secondary to severe and disabling multiple sclerosis  - has a chronic suprapubic catheter in place   . Multiple sclerosis  - Per ST evaluation on 1/26, okay to start Gilyena. This medication had been held because patient was unable to take by mouth do to extreme lethargy and it could not be given by any other route.  . Seizure disorder  - Continue with Keppra. Neurology input appreciated. EEG without seizure activity. Neurology recommends continuing current Keppra dose.  . Quadriparesis (muscle weakness)  - All 4 extremities are contracted from multiple sclerosis   Code Status: Full. Continued aggressive care as discussed at Palliative GOC meeting on 1/24. Family Communication: Discussed with patient's mother Disposition Plan: Medically ready for discharge. SNF ? 1/27  Consultants:  Palliative care team  Neuro Hospitalists  Procedures: None  Antibiotics: Maxipime 01/29/2012>  1/23 Ciprofloxacin 01/29/2012>1/23 Vancomycin 01/29/2012> 1/23 Zosyn 1 dose on 1/21 Rocephin 1/23 >  DVT prophylaxis: lovenox  HPI/Subjective:  This am- alert and tracking with eyes. No further fevers.  Per mother, continues to improve.    Objective: Blood pressure 128/84, pulse 101, temperature 98.8 F (37.1 C), temperature source Axillary, resp. rate 17, height 6' 2.02" (1.88 m), weight 83.1 kg (183 lb 3.2 oz), SpO2 98.00%.  Intake/Output Summary (Last 24 hours) at 02/03/12 1608 Last data filed at 02/03/12 1300  Gross per 24 hour  Intake    100 ml  Output   1578 ml  Net  -1478 ml     Exam: General: Chronically ill looking male with extensive contractures of extremities Lungs: Clear to auscultation bilaterally without wheezes or crackles Cardiovascular: Regular rate and rhythm without murmur gallop or rub normal S1 and S2 Abdomen: Nontender, nondistended, soft, bowel sounds positive, no rebound, no ascites, no appreciable mass. PEG and suprapubic tube sites look okay. Extremities:all extremities are contracted CNS: alert and tracking with eyes, nonverbal,   Data Reviewed: Basic Metabolic Panel:  Lab 02/03/12 1610 02/02/12 0605 02/01/12 1523 02/01/12 0555 01/31/12 0645 01/30/12 0455  NA 136 142 -- 136 131* 136  K 3.7 3.4* -- 2.8* 3.4* 3.5  CL 100 104 -- 97 92* 104  CO2 24 27 -- 23 24 21   GLUCOSE 90 106* -- 145* 137* 114*  BUN 15 15 -- 12 11 11   CREATININE 0.50 0.55 -- 0.55 0.53 0.51  CALCIUM 9.3 9.1 -- 8.6 8.7 8.4  MG -- -- 2.2 -- -- --  PHOS -- -- -- -- -- --   Liver Function Tests:  Lab 01/30/12 0455 01/29/12 0307  AST 46* 22  ALT 38 37  ALKPHOS 93 120*  BILITOT 0.4 0.2*  PROT 6.4 7.6  ALBUMIN 3.1* 3.7   CBC:  Lab 02/03/12 0618 02/01/12 0555 01/31/12 0645 01/30/12 0455 01/29/12 1651  WBC 7.6 10.0 11.6* 10.9* 15.0*  NEUTROABS -- -- -- -- --  HGB 12.5* 12.7* 12.9* 12.6* 12.2*  HCT 36.6* 36.3* 36.8* 36.4* 36.3*  MCV 88.2 85.6 84.4 86.1 87.9  PLT 291 280 232 235 247   CBG:  Lab 02/01/12 0815 01/31/12 0916 01/30/12 0806 01/29/12 0229  GLUCAP 113* 131* 96 117*    Recent Results (from the past 240 hour(s))  CULTURE, BLOOD (ROUTINE X 2)     Status: Normal  (Preliminary result)   Collection Time   01/29/12  3:00 AM      Component Value Range Status Comment   Specimen Description BLOOD RIGHT HAND   Final    Special Requests BOTTLES DRAWN AEROBIC ONLY 10CC   Final    Culture  Setup Time 01/29/2012 09:28   Final    Culture     Final    Value:        BLOOD CULTURE RECEIVED NO GROWTH TO DATE CULTURE WILL BE HELD FOR 5 DAYS BEFORE ISSUING A FINAL NEGATIVE REPORT   Report Status PENDING   Incomplete   CULTURE, BLOOD (ROUTINE X 2)     Status: Normal (Preliminary result)   Collection Time   01/29/12  3:10 AM      Component Value Range Status Comment   Specimen Description BLOOD LEFT HAND   Final    Special Requests BOTTLES DRAWN AEROBIC ONLY 4CC   Final    Culture  Setup Time 01/29/2012 09:28   Final    Culture     Final  Value:        BLOOD CULTURE RECEIVED NO GROWTH TO DATE CULTURE WILL BE HELD FOR 5 DAYS BEFORE ISSUING A FINAL NEGATIVE REPORT   Report Status PENDING   Incomplete   URINE CULTURE     Status: Normal   Collection Time   01/29/12  3:35 AM      Component Value Range Status Comment   Specimen Description URINE, CATHETERIZED   Final    Special Requests CX ADDED AT 0407 ON 161096   Final    Culture  Setup Time 01/29/2012 09:31   Final    Colony Count >=100,000 COLONIES/ML   Final    Culture PROTEUS MIRABILIS   Final    Report Status 01/31/2012 FINAL   Final    Organism ID, Bacteria PROTEUS MIRABILIS   Final   MRSA PCR SCREENING     Status: Normal   Collection Time   01/29/12  2:05 PM      Component Value Range Status Comment   MRSA by PCR NEGATIVE  NEGATIVE Final      Studies:  Recent x-ray studies have been reviewed in detail by the Attending Physician  Scheduled Meds:  Reviewed in detail by the Attending Physician     [facility] Pager: [phone] If 7PM-7AM, please contact night-coverage [email or web address] Password [email or web address] 02/03/2012, 4:08 PM   LOS: 5 days

## 2012-02-04 ENCOUNTER — Inpatient Hospital Stay (HOSPITAL_COMMUNITY): Payer: Medicare HMO

## 2012-02-04 LAB — CBC WITH DIFFERENTIAL/PLATELET
Eosinophils Absolute: 0.3 10*3/uL (ref 0.0–0.7)
Eosinophils Relative: 3 % (ref 0–5)
HCT: 37.1 % — ABNORMAL LOW (ref 39.0–52.0)
Lymphocytes Relative: 16 % (ref 12–46)
Lymphs Abs: 1.7 10*3/uL (ref 0.7–4.0)
MCH: 29.5 pg (ref 26.0–34.0)
MCV: 88.3 fL (ref 78.0–100.0)
Monocytes Absolute: 0.9 10*3/uL (ref 0.1–1.0)
Monocytes Relative: 8 % (ref 3–12)
RBC: 4.2 MIL/uL — ABNORMAL LOW (ref 4.22–5.81)
WBC: 10.9 10*3/uL — ABNORMAL HIGH (ref 4.0–10.5)

## 2012-02-04 LAB — CULTURE, BLOOD (ROUTINE X 2): Culture: NO GROWTH

## 2012-02-04 LAB — URINALYSIS, ROUTINE W REFLEX MICROSCOPIC
Bilirubin Urine: NEGATIVE
Glucose, UA: NEGATIVE mg/dL
Ketones, ur: NEGATIVE mg/dL
Nitrite: NEGATIVE
Specific Gravity, Urine: 1.034 — ABNORMAL HIGH (ref 1.005–1.030)
pH: 6 (ref 5.0–8.0)

## 2012-02-04 MED ORDER — FREE WATER
200.0000 mL | Freq: Every day | Status: DC
  Administered 2012-02-04 – 2012-02-08 (×21): 200 mL

## 2012-02-04 MED ORDER — PIPERACILLIN-TAZOBACTAM 3.375 G IVPB
3.3750 g | Freq: Three times a day (TID) | INTRAVENOUS | Status: DC
  Administered 2012-02-04 – 2012-02-08 (×12): 3.375 g via INTRAVENOUS
  Filled 2012-02-04 (×14): qty 50

## 2012-02-04 MED ORDER — LEVETIRACETAM 100 MG/ML PO SOLN
750.0000 mg | Freq: Two times a day (BID) | ORAL | Status: DC
  Administered 2012-02-04 – 2012-02-08 (×9): 750 mg
  Filled 2012-02-04 (×11): qty 7.5

## 2012-02-04 NOTE — Progress Notes (Signed)
ANTIBIOTIC CONSULT NOTE - INITIAL  Pharmacy Consult for zosyn Indication: UTI  No Known Allergies  Patient Measurements: Height: 6' 2.02" (188 cm) Weight: 183 lb 3.2 oz (83.1 kg) IBW/kg (Calculated) : 82.24    Vital Signs: Temp: 100.6 F (38.1 C) (01/27 0947) Temp src: Axillary (01/27 0947) BP: 127/71 mmHg (01/27 0553) Pulse Rate: 115  (01/27 0553) Intake/Output from previous day: 01/26 0701 - 01/27 0700 In: -  Out: 826 [Urine:825; Stool:1] Intake/Output from this shift:    Labs:  Basename 02/03/12 0618 02/02/12 0605  WBC 7.6 --  HGB 12.5* --  PLT 291 --  LABCREA -- --  CREATININE 0.50 0.55   Estimated Creatinine Clearance: 145.6 ml/min (by C-G formula based on Cr of 0.5). No results found for this basename: VANCOTROUGH:2,VANCOPEAK:2,VANCORANDOM:2,GENTTROUGH:2,GENTPEAK:2,GENTRANDOM:2,TOBRATROUGH:2,TOBRAPEAK:2,TOBRARND:2,AMIKACINPEAK:2,AMIKACINTROU:2,AMIKACIN:2, in the last 72 hours   Microbiology: Recent Results (from the past 720 hour(s))  CULTURE, BLOOD (ROUTINE X 2)     Status: Normal   Collection Time   01/29/12  3:00 AM      Component Value Range Status Comment   Specimen Description BLOOD RIGHT HAND   Final    Special Requests BOTTLES DRAWN AEROBIC ONLY 10CC   Final    Culture  Setup Time 01/29/2012 09:28   Final    Culture NO GROWTH 5 DAYS   Final    Report Status 02/04/2012 FINAL   Final   CULTURE, BLOOD (ROUTINE X 2)     Status: Normal   Collection Time   01/29/12  3:10 AM      Component Value Range Status Comment   Specimen Description BLOOD LEFT HAND   Final    Special Requests BOTTLES DRAWN AEROBIC ONLY 4CC   Final    Culture  Setup Time 01/29/2012 09:28   Final    Culture NO GROWTH 5 DAYS   Final    Report Status 02/04/2012 FINAL   Final   URINE CULTURE     Status: Normal   Collection Time   01/29/12  3:35 AM      Component Value Range Status Comment   Specimen Description URINE, CATHETERIZED   Final    Special Requests CX ADDED AT 0407 ON  161096   Final    Culture  Setup Time 01/29/2012 09:31   Final    Colony Count >=100,000 COLONIES/ML   Final    Culture PROTEUS MIRABILIS   Final    Report Status 01/31/2012 FINAL   Final    Organism ID, Bacteria PROTEUS MIRABILIS   Final   MRSA PCR SCREENING     Status: Normal   Collection Time   01/29/12  2:05 PM      Component Value Range Status Comment   MRSA by PCR NEGATIVE  NEGATIVE Final     Medical History: Past Medical History  Diagnosis Date  . MS (multiple sclerosis)   . 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 Childhood asthma   . Depression   . Neuromuscular disorder     Quadraperesis  . Recurrent UTI   . Dysphagia   . Bladder calculi   . Neurogenic bladder   . Shortness of breath   . Recurrent upper respiratory infection (URI)   . Normocytic anemia 05/28/2011    Medications:  Prescriptions prior to admission  Medication Sig Dispense Refill  . acetaminophen (TYLENOL) 160 MG/5ML solution 160 mg by PEG Tube route every 6 (six) hours as needed. For pain.      Marland Kitchen ascorbic acid (VITAMIN C) 500 MG/5ML  syrup 500 mg by PEG Tube route 2 (two) times daily.      Marland Kitchen buPROPion (WELLBUTRIN) 100 MG tablet 100 mg by PEG Tube route 3 (three) times daily.      . cholecalciferol (VITAMIN D) 1000 UNITS tablet 1,000 Units by PEG Tube route 2 (two) times daily.       . Cranberry 475 MG CAPS 1 capsule by PEG Tube route 2 (two) times daily.      . Cranberry-Vitamin C-Inulin (UTI-STAT) LIQD 30 mLs by PEG Tube route 2 (two) times daily.      . Fingolimod HCl (GILENYA) 0.5 MG CAPS Take 1 capsule by mouth daily.      . Garlic 500 MG CAPS 1,000 mg by PEG Tube route every morning.      . levETIRAcetam (KEPPRA) 500 MG tablet 500 mg by PEG Tube route 2 (two) times daily.      . metoCLOPramide (REGLAN) 5 MG/5ML solution Place 5 mg into feeding tube every 6 (six) hours.       . Multiple Vitamin (MULITIVITAMIN WITH MINERALS) TABS 1 tablet by PEG Tube route every morning.       . Nutritional  Supplements (FEEDING SUPPLEMENT, OSMOLITE 1.5 CAL,) LIQD Place 75 mLs into feeding tube continuous.       Marland Kitchen omega-3 acid ethyl esters (LOVAZA) 1 G capsule 1 g by PEG Tube route every morning.      . polyethylene glycol (MIRALAX / GLYCOLAX) packet 17 g by PEG Tube route every other day.       Marland Kitchen tiZANidine (ZANAFLEX) 2 MG tablet 4 mg by PEG Tube route every 6 (six) hours.      . Water For Irrigation, Sterile (FREE WATER) SOLN Place 150 mLs into feeding tube 6 (six) times daily.  500 mL  0   Assessment: 37 yo man with suprapubic catheter to change antibiotics from rocephin to zosyn for continued low grade fevers.  He has Proteus growing from UC.   Goal of Therapy:  Eradication of infection  Plan:  Zosyn 3.375 gm IV q8 hours. F/u clinical progress.  Witney Huie Poteet 02/04/2012,1:20 PM

## 2012-02-04 NOTE — Care Management Note (Signed)
  Page 2 of 2   02/05/2012     3:16:03 PM   CARE MANAGEMENT NOTE 02/05/2012  Patient:  CLESTER, CHLEBOWSKI   Account Number:  [id number]  Date Initiated:  02/04/2012  Documentation initiated by:  Ronny Flurry  Subjective/Objective Assessment:     Action/Plan:   Anticipated DC Date:  02/06/2012   Anticipated DC Plan:  SKILLED NURSING FACILITY         Choice offered to / List presented to:             Status of service:   Medicare Important Message given?   (If response is "NO", the following Medicare IM given date fields will be blank) Date Medicare IM given:   Date Additional Medicare IM given:    Discharge Disposition:    Per UR Regulation:    If discussed at Long Length of Stay Meetings, dates discussed:    Comments:  02-05-12 Tommy from [facility] called back patient does not meet LTAC criteria .  Called patient's mother [name] and left voice mail regarding same .  Ronny Flurry RN BSN [phone]    02-05-12 Spoke with patient's mother , SW and MD at bedside . Patient 's mother wanting patient to go to LTAC [facility] ) , explained patient may not need IV antibiotics at discharge and he may not meet criteria for LTAC .  Mother voiced understanding , asked her what her plan would be if LTAC denied patient , she stated SNF . SW continuing to work on SNF .  Ronny Flurry RN BSN [phone]    02-04-12 Patient from SNF , per social worker family wanting to take patient home with home health at discharge.  Family not in patient's room at present . Left private Duty Care Agencies List and Home Health Agency list in patient's room . Called patient's wife [name] at [phone] and left voice mail for her to return call , also called patient's mother [name] [phone] no answer .  Ronny Flurry RN BS [phone]

## 2012-02-04 NOTE — Clinical Social Work Note (Addendum)
Clinical Social Worker spoke with patient's spouse and mother, who were at bedside regarding update on SNF search. CSW informed family that patient does not have any bed offers in [facility] and explained the need to expand search. Family is agreeable for CSW to expand search to [facility] and [facility]. Mother and spouse expressed interest in having patient transferred to [facility], because "they specialize in MS." CSW informed family that MD will be notified of their request. During visit, family expressed multiple accounts at previous SNF where the patient was not adequately cared for. Spouse reported, for instance that patient was not turned often, therefore creating bed sores, and there have been an instance where feces was "caked to his skin." CSW provided family with information on Ombudsman contact because they expressed several complaints they experienced at patient's previous facility. CSW will continue to follow and update family when bed offers are received. CSW sent prior approval request to [facility].   13:17 pm CSW received phone call from patient's mother, [name] regarding decision to take patient home with home health services instead of SNF. CSW notified CM.   13:30pm CSW received another call from [facility] and she requested to keep SNF search going, just in case. CSW will update family, when bed offers are made. Mother sounded very anxious and overwhelmed regarding disposition planning for patient. CSW validated mother's feelings and offered emotional support.   Rozetta Nunnery MSW, [employer] [phone]

## 2012-02-04 NOTE — Progress Notes (Signed)
[facility] Progress Note [place] TEAM 1 - Stepdown/ICU TEAM   Terry Palmer EAV:[id number] DOB: 1975-02-19 DOA: 01/29/2012 PCP: Dorrene German, MD  Brief narrative: 37 year old man with a history of disabling multiple sclerosis and resultant quadriparesis, severe dysphagia with a PEG tube in place, history of neurogenic bladder with a suprapubic catheter in place who was brought to the hospital for evaluation of altered mental status w/ fever. Patient is a resident of a local skilled nursing facility.  Per the patient's mother, since approximately 2 days ago patient started having fever and purulent discharge from his urine was noted in the Foley bag. He then started to have fever. Apparently a urinary analysis was done at his skilled nursing facility that was "negative" for UTI.  His Foley catheter was changed 01/28/2012. Over 24 hours, patient started getting more lethargic,and started moaning and groaning. He was then brought to the emergency room where she was found to have persistent fever in spite of giving Tylenol and ibuprofen.  He was found to have a urinary tract infection. He was found to have persistent tachycardia, but his blood pressure was stable.  Assessment/Plan:  Sepsis/Fever  Source was suspected to be from UTI/indwelling suprapubic catheter. Chest x-ray was negative and sacral decubitus was dry without features of infection. He was initially started on IV cefepime, Levaquin and vancomycin. Final urine culture results showed Proteus-antibiotics were narrowed to Rocephin. Over the last 48 hours, patient has had intermittent low-grade fevers (MAXIMUM TEMPERATURE 100.79F). No cough. Chest x-ray negative. Urine appears slightly concentrated. Suprapubic catheter site without pus or features of cellulitis. Will repeat blood cultures, urine cultures and change IV antibiotics to Zosyn. If cultures are negative or shows same orgasm in urine culture, can revert back to Rocephin. UA  shows decrease in white blood cell compared to prior results but still many bacteria (? Colonization). Discussed with neurology who recommends holding Fingolimod until concern for infection completely resolved.  Altered mental status/toxic metabolite encephalopathy  This was presumed to be secondary to sepsis/UTI. His mental status improved on 1/25 and 1/26 when he was alert and interacting with family. Family history of involuntary movements prior to admission. EEG did not show seizure like activity. Patient is not on sedative medications. Patient apparently was awake all night and is sleeping this a.m. Will monitor as the day progresses.  UTI (lower urinary tract infection) - Proteus Chronic suprapubic catheter was changed a week ago. Management as per above.  Hypokalemia Repleted  Sacral decubitus ulcer, stage II  Management per wound care team. Has air overlay mattress. No features of active infection.  Protein calorie malnutrition  Continue PEG feeding. Tolerating same. We'll increase free water secondary to possible dehydration from fevers.  Neurogenic bladder  Secondary to multiple sclerosis. Has chronic suprapubic catheter.  Multiple sclerosis  Although speech therapy cleared patient to start Gilyena on 1/26, given fevers will hold it until certain that infections are resolved. Family were asking social worker about transfer to [facility]. At this time no clear indication requiring transfer to [facility]. As discussed with neurology, from neurological standpoint he doesn't warrant transfer.   Seizure disorder  There was some concern of ongoing seizures as cause for altered mental status. EEG was repeated and did not show seizure like activity. Neurology consulted and recommended increasing Keppra to 750 mg via PEG twice a day  Quadriparesis (muscle weakness)  All 4 extremities are contracted from multiple sclerosis   Code Status: Full. Continued aggressive care as discussed  at Palliative GOC meeting on 1/24. Family Communication: Discussed with patient's mother and spouse at bedside Disposition Plan: Not medically ready for discharge.  Consultants:  Palliative care team  Neuro Hospitalists  Procedures: None  Antibiotics: Maxipime 01/29/2012> 1/23 Ciprofloxacin 01/29/2012>1/23 Vancomycin 01/29/2012> 1/23 Zosyn 1 dose on 1/21 Rocephin 1/23 > 1/27 Zosyn 1/27 >  DVT prophylaxis: lovenox  HPI/Subjective: Per nursing, patient apparently was awake all night and is somnolent this morning. Arousable.   Objective: Blood pressure 127/71, pulse 115, temperature 100.6 F (38.1 C), temperature source Axillary, resp. rate 18, height 6' 2.02" (1.88 m), weight 83.1 kg (183 lb 3.2 oz), SpO2 94.00%.  Intake/Output Summary (Last 24 hours) at 02/04/12 1225 Last data filed at 02/04/12 0554  Gross per 24 hour  Intake      0 ml  Output    826 ml  Net   -826 ml     Exam: General: Chronically ill looking male with extensive contractures of extremities Lungs: Clear to auscultation bilaterally without wheezes or crackles Cardiovascular: S1 and S2 heard, regular mild tachycardia. No JVD, murmurs or pedal edema. Abdomen: Nondistended, soft and nontender. Suprapubic catheter site unremarkable-no purulent drainage or erythema. PEG site looks okay. Normal bowel sounds heard.  Extremities:all extremities are contracted CNS: Somnolent this morning but arousable, nonverbal,   Data Reviewed: Basic Metabolic Panel:  Lab 02/03/12 1610 02/02/12 0605 02/01/12 1523 02/01/12 0555 01/31/12 0645 01/30/12 0455  NA 136 142 -- 136 131* 136  K 3.7 3.4* -- 2.8* 3.4* 3.5  CL 100 104 -- 97 92* 104  CO2 24 27 -- 23 24 21   GLUCOSE 90 106* -- 145* 137* 114*  BUN 15 15 -- 12 11 11   CREATININE 0.50 0.55 -- 0.55 0.53 0.51  CALCIUM 9.3 9.1 -- 8.6 8.7 8.4  MG -- -- 2.2 -- -- --  PHOS -- -- -- -- -- --   Liver Function Tests:  Lab 01/30/12 0455 01/29/12 0307  AST 46* 22  ALT 38  37  ALKPHOS 93 120*  BILITOT 0.4 0.2*  PROT 6.4 7.6  ALBUMIN 3.1* 3.7   CBC:  Lab 02/03/12 0618 02/01/12 0555 01/31/12 0645 01/30/12 0455 01/29/12 1651  WBC 7.6 10.0 11.6* 10.9* 15.0*  NEUTROABS -- -- -- -- --  HGB 12.5* 12.7* 12.9* 12.6* 12.2*  HCT 36.6* 36.3* 36.8* 36.4* 36.3*  MCV 88.2 85.6 84.4 86.1 87.9  PLT 291 280 232 235 247   CBG:  Lab 02/01/12 0815 01/31/12 0916 01/30/12 0806 01/29/12 0229  GLUCAP 113* 131* 96 117*    Recent Results (from the past 240 hour(s))  CULTURE, BLOOD (ROUTINE X 2)     Status: Normal   Collection Time   01/29/12  3:00 AM      Component Value Range Status Comment   Specimen Description BLOOD RIGHT HAND   Final    Special Requests BOTTLES DRAWN AEROBIC ONLY 10CC   Final    Culture  Setup Time 01/29/2012 09:28   Final    Culture NO GROWTH 5 DAYS   Final    Report Status 02/04/2012 FINAL   Final   CULTURE, BLOOD (ROUTINE X 2)     Status: Normal   Collection Time   01/29/12  3:10 AM      Component Value Range Status Comment   Specimen Description BLOOD LEFT HAND   Final    Special Requests BOTTLES DRAWN AEROBIC ONLY 4CC   Final    Culture  Setup Time 01/29/2012 09:28  Final    Culture NO GROWTH 5 DAYS   Final    Report Status 02/04/2012 FINAL   Final   URINE CULTURE     Status: Normal   Collection Time   01/29/12  3:35 AM      Component Value Range Status Comment   Specimen Description URINE, CATHETERIZED   Final    Special Requests CX ADDED AT 0407 ON 161096   Final    Culture  Setup Time 01/29/2012 09:31   Final    Colony Count >=100,000 COLONIES/ML   Final    Culture PROTEUS MIRABILIS   Final    Report Status 01/31/2012 FINAL   Final    Organism ID, Bacteria PROTEUS MIRABILIS   Final   MRSA PCR SCREENING     Status: Normal   Collection Time   01/29/12  2:05 PM      Component Value Range Status Comment   MRSA by PCR NEGATIVE  NEGATIVE Final      Studies:  Recent x-ray studies have been reviewed in detail by the Attending  Physician  Scheduled Meds:  Reviewed in detail by the Attending Physician     [facility] Pager: [phone] If 7PM-7AM, please contact night-coverage [email or web address] Password [facility] 02/04/2012, 12:25 PM   LOS: 6 days

## 2012-02-04 NOTE — Progress Notes (Signed)
NUTRITION FOLLOW UP  Intervention:   1. Continue Osmolite 1.5 at 50 ml/h with Prostat 60 ml BID. 2. Recommend bi-weekly weights to help assess nutritional status and TF adequacy 3. RD to continue to follow nutrition care plan  Nutrition Dx:   Inadequate oral intake related to chronic dysphagia as evidenced by receiving TF via PEG for full nutrition support. Ongoing.  Goal:   Intake to meet >90% of estimated nutrition needs.  Monitor:   TF tolerance/adequacy, weight trend, labs, I/O  Assessment:   Palliative care was consulted on 1/23. Family continues to pursue aggressive medical treatment, including enteral nutrition.  BSE completed on 1/25 with recommendations of NPO status. Per chart, pt was able to swallow medications yesterday.  Continues to receive Osmolite 1.5 at 50 ml/h with Prostat 60 ml BID; provides 2200 kcals, 135 gm protein, 914 ml free water daily. Free water of 150 ml 6 x daily, provides an additional 900 ml water daily. RN confirms that pt is tolerating goal rate at this time.  Height: Ht Readings from Last 1 Encounters:  01/29/12 6' 2.02" (1.88 m)    Weight Status:  No new weight available Wt Readings from Last 1 Encounters:  01/29/12 183 lb 3.2 oz (83.1 kg)    Re-estimated needs:  Kcal: 2000 - 2200 kcal Protein: 120 - 135 grams protein Fluid: 2 liters  Skin: stage II on foot  Diet Order: NPO   Intake/Output Summary (Last 24 hours) at 02/04/12 1129 Last data filed at 02/04/12 0554  Gross per 24 hour  Intake      0 ml  Output    826 ml  Net   -826 ml    Last BM: 1/26   Labs:   Lab 02/03/12 0618 02/02/12 0605 02/01/12 1523 02/01/12 0555  NA 136 142 -- 136  K 3.7 3.4* -- 2.8*  CL 100 104 -- 97  CO2 24 27 -- 23  BUN 15 15 -- 12  CREATININE 0.50 0.55 -- 0.55  CALCIUM 9.3 9.1 -- 8.6  MG -- -- 2.2 --  PHOS -- -- -- --  GLUCOSE 90 106* -- 145*    CBG (last 3)  No results found for this basename: GLUCAP:3 in the last 72 hours  Scheduled  Meds:   . antiseptic oral rinse  15 mL Mouth Rinse q12n4p  . artificial tears  1 application Both Eyes Q8H  . ascorbic acid  500 mg Per Tube BID  . buPROPion  100 mg Per Tube TID  . cefTRIAXone (ROCEPHIN)  IV  1 g Intravenous Q24H  . chlorhexidine  15 mL Mouth Rinse BID  . enoxaparin (LOVENOX) injection  40 mg Subcutaneous Q24H  . feeding supplement  60 mL Per Tube BID  . Fingolimod HCl  1 capsule Oral Daily  . free water  150 mL Per Tube 6 X Daily  . levETIRAcetam  750 mg Per Tube BID  . metoCLOPramide  5 mg Per Tube Q6H  . multivitamin  5 mL Per Tube Daily  . omega-3 acid ethyl esters  1 g Per Tube Daily  . polyethylene glycol  17 g Per Tube QODAY  . tiZANidine  4 mg Per Tube Q6H    Continuous Infusions:   . feeding supplement (OSMOLITE 1.5 CAL) 1,000 mL (02/01/12 1631)    Jarold Motto MS, RD, LDN Pager: [phone] After-hours pager: [phone]

## 2012-02-04 NOTE — Progress Notes (Signed)
Patient Terry Palmer      DOB: May 31, 1975      YQM:[id number]   Palliative Medicine Team at [facility] Progress Note    Subjective: He was noted to be a week most of the night. He was able to communicate with his nurses by blinking his eyes. Currently he has very much sound asleep he will stir slightly with tactile stimuli but is not opening his eyes to verbal stimuli. Noted he was able to swallow his medications yesterday. Family continues to be vigilant about his care.  Filed Vitals:   02/04/12 0553  BP: 127/71  Pulse: 115  Temp: 99.9 F (37.7 C)  Resp: 18   Physical exam:  Gen. no acute distress. Sleeping at present and will rouse to tactile stimuli but not open eyes. Pupils not examined. Because membranes remain dry Chest is decreased but clear to auscultation and don't hear rhonchi at this time Cardiovascular tachycardic positive S1 and S2 no S3 or S4 no murmur or gallop Abdomen: Soft nontender PEG tube in place no grimace on palpation Extremities: Contracture of all limbs at baseline Neurologically: Somnolent at this time, but he was up all night.  Lab Results  Component Value Date   WBC 7.6 02/03/2012   HGB 12.5* 02/03/2012   HCT 36.6* 02/03/2012   MCV 88.2 02/03/2012   PLT 291 02/03/2012   Lab Results  Component Value Date   CREATININE 0.50 02/03/2012   BUN 15 02/03/2012   NA 136 02/03/2012   K 3.7 02/03/2012   CL 100 02/03/2012   CO2 24 02/03/2012      Assessment and  plan: 37 year old white male with advanced multiple sclerosis admitted with sepsis secondary to urinary tract infection. Patient continues to show intermittent slight improvements. He was able to swallow some yesterday in order to take his multiple sclerosis medication. He continues with his tube feeds and standard of care treatment for his infections at his family's request. He is a full code.   1. Full code 2. Urinary tract infection continue antibiotics as per standard of care. 3 Multiple  sclerosis with dysphagia: If patient is able to safely take 2 swallows he will be permitted to take his Ginelya which may improve his ability to interact and swallow.  4. patient will return to skilled nursing facility once medically stable per his family. 5. Decubitus on his sacrum and left heel.  Prognosis: [name] has already outlived a poor prognosis one year ago. His family desires to continue aggressive care, he is at continued risk , however, for death at any time related to his multiple medical issues.  Disposition: Placement when medically stable   Total time 15 minutes; 7 30 -745 AM  Khrystal Jeanmarie L. Ladona Ridgel, MD MBA The Palliative Medicine Team at [facility] Phone: [phone] Pager: [phone]

## 2012-02-05 LAB — URINE CULTURE: Colony Count: NO GROWTH

## 2012-02-05 NOTE — Progress Notes (Signed)
Palliative Medicine Team SW Psychosocial follow up, no family present, pt sleeping but comfortable, fever improving per RN. Spoke with pt's wife Terry Palmer by phone who reports "feeling depressed today" in reaction to news of pt's fever and that pt "having down day".  Wife expressed family's continued difficulty with the "ups and downs" and "feeling unsure" about what to be hopeful for. Empathized with the "day by day" approach that family has had to take with pt's care and with their attempts to view the "big picture". Wife reports her appreciation for family support and their ability to take shifts with pt's care. Wife home with children today due schools' early release, but reports pt's father due to visit later today. Reviewed importance of self-care and resources for support. Wife appreciative of support, has my contact info as needs arise. Available as needed.   Kennieth Francois, LCSWA PMT Phone [phone]

## 2012-02-05 NOTE — Progress Notes (Signed)
Terry Palmer is more alert now. Blinks once for yes and twice for no. I asked pt if he was in pain and he blinked twice for no.

## 2012-02-05 NOTE — Progress Notes (Signed)
[facility] Progress Note [place] TEAM 1 - Stepdown/ICU TEAM   Terry Palmer ZOX:[id number] DOB: 07-28-1975 DOA: 01/29/2012 PCP: Dorrene German, MD  Brief narrative: 37 year old man with a history of disabling multiple sclerosis and resultant quadriparesis, severe dysphagia with a PEG tube in place, history of neurogenic bladder with a suprapubic catheter in place who was brought to the hospital for evaluation of altered mental status w/ fever. Patient is a resident of a local skilled nursing facility.  Per the patient's mother, since approximately 2 days ago patient started having fever and purulent discharge from his urine was noted in the Foley bag. He then started to have fever. Apparently a urinary analysis was done at his skilled nursing facility that was "negative" for UTI.  His Foley catheter was changed 01/28/2012. Over 24 hours, patient started getting more lethargic,and started moaning and groaning. He was then brought to the emergency room where she was found to have persistent fever in spite of giving Tylenol and ibuprofen.  He was found to have a urinary tract infection. He was found to have persistent tachycardia, but his blood pressure was stable.  Assessment/Plan:  Sepsis/Fever  Source was suspected to be from UTI/indwelling suprapubic catheter. Chest x-ray was negative and sacral decubitus was dry without features of infection. He was initially started on IV cefepime, Levaquin and vancomycin. Final urine culture results showed Proteus-antibiotics were narrowed to Rocephin. After a few days of being afebrile, patient again had intermittent low-grade fevers (MAXIMUM TEMPERATURE 100.20F) and altered MS on 1/28. No cough. Chest x-ray negative. UA shows decrease in white blood cell compared to prior results but still many bacteria (? Colonization). Suprapubic catheter site without pus or features of cellulitis. Repeat blood cultures, urine cultures were obtained and changed IV  antibiotics to Zosyn. If cultures are negative, one option may be to stop all antibiotics (day 8 of IV antibiotics today) and monitor (? Drug fever). Fingolimod held until concern for infection completely resolved. Blood cultures negative to date.  Altered mental status/toxic metabolite encephalopathy  This was presumed to be secondary to sepsis/UTI. His mental status improved on 1/25 and 1/26 when he was alert and interacting with family. Family history of involuntary movements prior to admission. EEG did not show seizure like activity. Patient is not on sedative medications.  On 1/27 and 1/28 a.m., patient began somnolent-per report patient slept overnight. His waxing and waning mental status changes could be from fevers or possible infection. If cultures negative, may consider stopping all antibiotics and monitor. May consider MRI brain but not sure how he will be able to tolerate given extreme contractures and intermittent spasms.  UTI (lower urinary tract infection) - Proteus Chronic suprapubic catheter was changed a week ago. Management as per above.  Hypokalemia Repleted  Sacral decubitus ulcer, stage II  Management per wound care team. Has air overlay mattress. No features of active infection.  Protein calorie malnutrition  Continue PEG feeding. Tolerating same. We'll increase free water secondary to possible dehydration from fevers.  Neurogenic bladder  Secondary to multiple sclerosis. Has chronic suprapubic catheter.  Multiple sclerosis  Although speech therapy cleared patient to start Gilyena on 1/26, given fevers will hold it until certain that infections are resolved. Family were asking social worker about transfer to [facility]. At this time no clear indication requiring transfer to [facility]. As discussed with neurology, from neurological standpoint he doesn't warrant transfer.   Seizure disorder  There was some concern of ongoing seizures as cause  for altered mental  status. EEG was repeated and did not show seizure like activity. Neurology consulted and recommended increasing Keppra to 750 mg via PEG twice a day  Quadriparesis (muscle weakness)  All 4 extremities are contracted from multiple sclerosis   Code Status: Full. Continued aggressive care as discussed at Palliative GOC meeting on 1/24. Family Communication: Discussed with patient's mother. Disposition Plan: Not medically ready for discharge.  Consultants:  Palliative care team  [facility]  Procedures: None  Antibiotics: Maxipime 01/29/2012> 1/23 Ciprofloxacin 01/29/2012>1/23 Vancomycin 01/29/2012> 1/23 Zosyn 1 dose on 1/21 Rocephin 1/23 > 1/27 Zosyn 1/27 >  DVT prophylaxis: lovenox  HPI/Subjective: Per nursing, patient slept overnight and somnolent today. Low-grade fevers intermittently.   Objective: Blood pressure 85/51, pulse 89, temperature 99.6 F (37.6 C), temperature source Axillary, resp. rate 18, height 6' 2.02" (1.88 m), weight 83.1 kg (183 lb 3.2 oz), SpO2 99.00%.  Intake/Output Summary (Last 24 hours) at 02/05/12 1605 Last data filed at 02/05/12 1354  Gross per 24 hour  Intake      0 ml  Output   1200 ml  Net  -1200 ml     Exam: General: Chronically ill looking male with extensive contractures of extremities Lungs: Clear to auscultation bilaterally without wheezes or crackles Cardiovascular: S1 and S2 heard, regular mild tachycardia. No JVD, murmurs or pedal edema. Abdomen: Nondistended, soft and nontender. Suprapubic catheter site unremarkable-no purulent drainage or erythema. PEG site looks okay. Normal bowel sounds heard.  Extremities:all extremities are contracted CNS: Somnolent this morning but arousable-briefly opens eyes, nonverbal,   Data Reviewed: Basic Metabolic Panel:  Lab 02/05/12 1610 02/03/12 0618 02/02/12 0605 02/01/12 1523 02/01/12 0555 01/31/12 0645 01/30/12 0455  NA -- 136 142 -- 136 131* 136  K -- 3.7 3.4* -- 2.8* 3.4* 3.5    CL -- 100 104 -- 97 92* 104  CO2 -- 24 27 -- 23 24 21   GLUCOSE -- 90 106* -- 145* 137* 114*  BUN -- 15 15 -- 12 11 11   CREATININE 0.66 0.50 0.55 -- 0.55 0.53 --  CALCIUM -- 9.3 9.1 -- 8.6 8.7 8.4  MG -- -- -- 2.2 -- -- --  PHOS -- -- -- -- -- -- --   Liver Function Tests:  Lab 01/30/12 0455  AST 46*  ALT 38  ALKPHOS 93  BILITOT 0.4  PROT 6.4  ALBUMIN 3.1*   CBC:  Lab 02/04/12 1250 02/03/12 0618 02/01/12 0555 01/31/12 0645 01/30/12 0455  WBC 10.9* 7.6 10.0 11.6* 10.9*  NEUTROABS 7.9* -- -- -- --  HGB 12.4* 12.5* 12.7* 12.9* 12.6*  HCT 37.1* 36.6* 36.3* 36.8* 36.4*  MCV 88.3 88.2 85.6 84.4 86.1  PLT 350 291 280 232 235   CBG:  Lab 02/05/12 0803 02/01/12 0815 01/31/12 0916 01/30/12 0806  GLUCAP 113* 113* 131* 96    Recent Results (from the past 240 hour(s))  CULTURE, BLOOD (ROUTINE X 2)     Status: Normal   Collection Time   01/29/12  3:00 AM      Component Value Range Status Comment   Specimen Description BLOOD RIGHT HAND   Final    Special Requests BOTTLES DRAWN AEROBIC ONLY 10CC   Final    Culture  Setup Time 01/29/2012 09:28   Final    Culture NO GROWTH 5 DAYS   Final    Report Status 02/04/2012 FINAL   Final   CULTURE, BLOOD (ROUTINE X 2)     Status: Normal   Collection  Time   01/29/12  3:10 AM      Component Value Range Status Comment   Specimen Description BLOOD LEFT HAND   Final    Special Requests BOTTLES DRAWN AEROBIC ONLY 4CC   Final    Culture  Setup Time 01/29/2012 09:28   Final    Culture NO GROWTH 5 DAYS   Final    Report Status 02/04/2012 FINAL   Final   URINE CULTURE     Status: Normal   Collection Time   01/29/12  3:35 AM      Component Value Range Status Comment   Specimen Description URINE, CATHETERIZED   Final    Special Requests CX ADDED AT 0407 ON 161096   Final    Culture  Setup Time 01/29/2012 09:31   Final    Colony Count >=100,000 COLONIES/ML   Final    Culture PROTEUS MIRABILIS   Final    Report Status 01/31/2012 FINAL   Final     Organism ID, Bacteria PROTEUS MIRABILIS   Final   MRSA PCR SCREENING     Status: Normal   Collection Time   01/29/12  2:05 PM      Component Value Range Status Comment   MRSA by PCR NEGATIVE  NEGATIVE Final   CULTURE, BLOOD (ROUTINE X 2)     Status: Normal (Preliminary result)   Collection Time   02/04/12 11:15 AM      Component Value Range Status Comment   Specimen Description BLOOD RIGHT HAND   Final    Special Requests BOTTLES DRAWN AEROBIC AND ANAEROBIC 10CC   Final    Culture  Setup Time 02/04/2012 21:48   Final    Culture     Final    Value:        BLOOD CULTURE RECEIVED NO GROWTH TO DATE CULTURE WILL BE HELD FOR 5 DAYS BEFORE ISSUING A FINAL NEGATIVE REPORT   Report Status PENDING   Incomplete   CULTURE, BLOOD (ROUTINE X 2)     Status: Normal (Preliminary result)   Collection Time   02/04/12 11:20 AM      Component Value Range Status Comment   Specimen Description BLOOD RIGHT ARM   Final    Special Requests BOTTLES DRAWN AEROBIC AND ANAEROBIC 10CC   Final    Culture  Setup Time 02/04/2012 18:05   Final    Culture     Final    Value:        BLOOD CULTURE RECEIVED NO GROWTH TO DATE CULTURE WILL BE HELD FOR 5 DAYS BEFORE ISSUING A FINAL NEGATIVE REPORT   Report Status PENDING   Incomplete      Studies:  Recent x-ray studies have been reviewed in detail by the Attending Physician  Scheduled Meds:  Reviewed in detail by the Attending Physician     [facility] Pager: [phone] If 7PM-7AM, please contact night-coverage [email or web address] Password [email or web address] 02/05/2012, 4:05 PM   LOS: 7 days

## 2012-02-06 DIAGNOSIS — R633 Feeding difficulties: Secondary | ICD-10-CM

## 2012-02-06 LAB — BASIC METABOLIC PANEL
CO2: 26 mEq/L (ref 19–32)
Calcium: 9.3 mg/dL (ref 8.4–10.5)
Glucose, Bld: 102 mg/dL — ABNORMAL HIGH (ref 70–99)
Sodium: 140 mEq/L (ref 135–145)

## 2012-02-06 LAB — CBC
HCT: 38.3 % — ABNORMAL LOW (ref 39.0–52.0)
Hemoglobin: 12.9 g/dL — ABNORMAL LOW (ref 13.0–17.0)
MCH: 29.9 pg (ref 26.0–34.0)
RBC: 4.31 MIL/uL (ref 4.22–5.81)

## 2012-02-06 NOTE — Progress Notes (Signed)
Patient ZO:XWRUEA RAKEEN GAILLARD      DOB: [birth year]      VWU:[id number]  Stopped by to check on Danny.  No family at the bedside.  He is awake and able to blink appropriately to answer if he is in pain- two blinks.  The PMT will shadow along as were see if [name] can get back to baseline and return to SNF  . We are always happy to reingage in goals of care along the course of his illness for now it is critical to his family to continue with full care and full code.  Jerris Keltz L. Ladona Ridgel, MD MBA The Palliative Medicine Team at [facility] Phone: [phone] Pager: [phone]

## 2012-02-06 NOTE — Clinical Social Work Note (Addendum)
Clinical Child psychotherapist spoke with patient's mother, Terry Palmer regarding decision between [facility] vs previous facility [facility]. Mother informed CSW that she, nor Terry Palmer, patient's wife, were able to tour [facility] today. Mother reported that she is feeling sick and Terry Palmer was unavailable today. CSW informed mother that a decision will need to made regarding placement due to patient's insurance requiring authorization and potential for discharge this week. Mother reported that she understood and stated that she will get in contact with Terry Palmer and will return call back to CSW. CSW also attempted to call wife, but received answering machine. CSW will continue to follow.   3:40pm  CSW received call from patient's mother, Terry Palmer and she is requesting for patient to return back to [facility]. Per Alessandra [place] was "too far." CSW informed facility to call into [facility] to proceed with prior authorization.   Rozetta Nunnery MSW, [employer] [phone]

## 2012-02-06 NOTE — Progress Notes (Signed)
[facility] PROGRESS NOTE  TRAVAS SCHEXNAYDER WUJ:[id number] DOB: 1975/03/16 DOA: 01/29/2012 PCP: Dorrene German, MD  Assessment/Plan: Principal Problem:  *Sepsis(995.91) Active Problems:  Quadriparesis (muscle weakness)  UTI (lower urinary tract infection)  Multiple sclerosis  Protein calorie malnutrition  Sacral decubitus ulcer, stage II  Neurogenic bladder  Encephalopathy, toxic  Hypokalemia    1. Sepsis/Fever:  Patient presented with altered mental status, described as lethargy, as well as a pyrexia. Source was suspected to be from UTI/indwelling suprapubic catheter. Chest x-ray was negative and sacral decubitus was dry without features of infection. He was initially started on IV Cefepime, Levaquin and Vancomycin. Final urine culture results showed Proteus and antibiotics were narrowed to Rocephin. After a few days of being afebrile, patient again had intermittent low-grade fevers (MAXIMUM TEMPERATURE 100.50F) and altered MS on 02/05/12. No cough. Chest x-ray negative. UA showed decrease in white blood cell compared to prior results, but still many bacteria (? Colonization). Suprapubic catheter site was without pus or features of cellulitis. Repeat blood cultures, urine cultures were obtained Antibiotics were changed to iv Zosyn on 02/05/12. Patient is now on day# 9 of antibiotic therapy, initial urine culture grew Proteus Mirabilis, sensitive to the cephalosporins and Zosyn, but resistant to the quinolones. Patient has remained afebrile, since Zosyn was started. Will continue. Blood cultures are negative so far. Fingolimod held until concern for infection completely resolved.  2. Altered mental status/toxic metabolite encephalopathy: This was presumed to be secondary to sepsis/UTI. His mental status improved, and as of 02/02/12, he was alert and interacting with family. EEG did not show seizure-like activity. Patient is not on sedative medications. Mental status has waxed and waned,  since, but patient was alert and interactive in AM of 02/06/12. Likely, the etiology is delirium.  3. UTI (lower urinary tract infection): This is due to Proteus mirabilis. Chronic suprapubic catheter was changed a week ago. Management as described. Above.  4. Hypokalemia: Repleted as indicated.  5. Sacral decubitus ulcer, stage II:  Managed per wound care team recommendations. Has air overlay mattress. No features of active infection.  6. Protein calorie malnutrition:  Continued on PEG feeding, which he is tolerating well.  7. Neurogenic bladder:  Secondary to multiple sclerosis. Has chronic suprapubic catheter.  8. Multiple sclerosis:  Patient is quadriparetic, and has neurogenic bladder as well as dysphagia, requiring PEG tube feeding and chronic Foley catheter. All four extremities are contracted from multiple sclerosis. Evaluated by SLP on 02/03/12, and cleared to resume oral Ginelya. This is still on hold, pending resolution of infection.  9. Seizure disorder:  There was some concern of on-going seizures as cause for altered mental status. EEG was repeated and did not show seizure like activity. Dr Ritta Slot provided neurology consultation, and recommended increasing Keppra to 750 mg via PEG twice a day. This has been implemented.     Code Status: Full Code.  Family Communication:  Disposition Plan: To be determined. Family were asking social worker about transfer to [facility]. At this time no clear indication requiring transfer to [facility]. As discussed with neurology, from neurological standpoint he doesn't warrant transfer.     Brief narrative: 37 year old man with a history of disabling multiple sclerosis and resultant quadriparesis, severe dysphagia with a PEG tube in place, history of neurogenic bladder s/p suprapubic catheter, who was brought to the hospital for evaluation of altered mental status and fever. Patient is a resident of a local skilled nursing facility. Per  the patient's mother, since approximately  2 days prior, patient started having fever and purulent discharge from his urine was noted in the urinary bag. Apparently a urinalysis was done at his skilled nursing facility, that was "negative" for UTI. His Foley catheter was changed 01/28/2012. Over the next 24 hours, patient got more lethargic, and started moaning and groaning. He was then brought to the emergency room, where he was found to have a urinary tract infection and a persistent tachycardia. Blood pressure was stable. He was admitted for further management.     Consultants:  Dr Ritta Slot, neurologist.   Dr Derenda Mis, Palliative Care Medicine.   Procedures:  CXR.   Antibiotics:  Cefepime 01/29/12-01/31/12.  Ciprofloxacin 01/29/12-01/31/12.  Rocephin 01/31/12-02/03/12.  Vancomycin 01/28/12-01/31/12.  Zosyn 02/04/12>>>  HPI/Subjective: More alert and interactive today.   Objective: Vital signs in last 24 hours: Temp:  [97.9 F (36.6 C)-100.3 F (37.9 C)] 97.9 F (36.6 C) (01/29 0556) Pulse Rate:  [89-101] 91  (01/29 0556) Resp:  [18-19] 18  (01/29 0556) BP: (85-121)/(51-88) 114/83 mmHg (01/29 0556) SpO2:  [94 %-100 %] 94 % (01/29 0556) Weight change:  Last BM Date: 02/06/12  Intake/Output from previous day: 01/28 0701 - 01/29 0700 In: -  Out: 1925 [Urine:1925]     Physical Exam: General: Comfortable, alert, not communicative, not short of breath at rest.  HEENT:  No clinical pallor, no jaundice, no conjunctival injection or discharge. Hydration status is fair.  NECK:  Supple, JVP not seen, no carotid bruits, no palpable lymphadenopathy, no palpable goiter. CHEST:  Clinically clear to auscultation, no wheezes, no crackles. HEART:  Sounds 1 and 2 heard, normal, regular, no murmurs. ABDOMEN:  Full, soft, non-tender, no palpable organomegaly, no palpable masses, normal bowel sounds. PEG site is clean and otherwise, unremarkable.  GENITALIA:  Not  examined. LOWER EXTREMITIES:  No pitting edema, palpable peripheral pulses. MUSCULOSKELETAL SYSTEM:  Bilateral UE/LE flexion contractures. CENTRAL NERVOUS SYSTEM:  Quadriparetic.  Lab Results:  Basename 02/06/12 0508 02/04/12 1250  WBC 7.1 10.9*  HGB 12.9* 12.4*  HCT 38.3* 37.1*  PLT 366 350    Basename 02/06/12 0508 02/05/12 0650  NA 140 --  K 3.8 --  CL 103 --  CO2 26 --  GLUCOSE 102* --  BUN 15 --  CREATININE 0.67 0.66  CALCIUM 9.3 --   Recent Results (from the past 240 hour(s))  CULTURE, BLOOD (ROUTINE X 2)     Status: Normal   Collection Time   01/29/12  3:00 AM      Component Value Range Status Comment   Specimen Description BLOOD RIGHT HAND   Final    Special Requests BOTTLES DRAWN AEROBIC ONLY 10CC   Final    Culture  Setup Time 01/29/2012 09:28   Final    Culture NO GROWTH 5 DAYS   Final    Report Status 02/04/2012 FINAL   Final   CULTURE, BLOOD (ROUTINE X 2)     Status: Normal   Collection Time   01/29/12  3:10 AM      Component Value Range Status Comment   Specimen Description BLOOD LEFT HAND   Final    Special Requests BOTTLES DRAWN AEROBIC ONLY 4CC   Final    Culture  Setup Time 01/29/2012 09:28   Final    Culture NO GROWTH 5 DAYS   Final    Report Status 02/04/2012 FINAL   Final   URINE CULTURE     Status: Normal   Collection Time   01/29/12  3:35  AM      Component Value Range Status Comment   Specimen Description URINE, CATHETERIZED   Final    Special Requests CX ADDED AT 0407 ON [id number]   Final    Culture  Setup Time 01/29/2012 09:31   Final    Colony Count >=100,000 COLONIES/ML   Final    Culture PROTEUS MIRABILIS   Final    Report Status 01/31/2012 FINAL   Final    Organism ID, Bacteria PROTEUS MIRABILIS   Final   MRSA PCR SCREENING     Status: Normal   Collection Time   01/29/12  2:05 PM      Component Value Range Status Comment   MRSA by PCR NEGATIVE  NEGATIVE Final   CULTURE, BLOOD (ROUTINE X 2)     Status: Normal (Preliminary result)    Collection Time   02/04/12 11:15 AM      Component Value Range Status Comment   Specimen Description BLOOD RIGHT HAND   Final    Special Requests BOTTLES DRAWN AEROBIC AND ANAEROBIC 10CC   Final    Culture  Setup Time 02/04/2012 21:48   Final    Culture     Final    Value:        BLOOD CULTURE RECEIVED NO GROWTH TO DATE CULTURE WILL BE HELD FOR 5 DAYS BEFORE ISSUING A FINAL NEGATIVE REPORT   Report Status PENDING   Incomplete   CULTURE, BLOOD (ROUTINE X 2)     Status: Normal (Preliminary result)   Collection Time   02/04/12 11:20 AM      Component Value Range Status Comment   Specimen Description BLOOD RIGHT ARM   Final    Special Requests BOTTLES DRAWN AEROBIC AND ANAEROBIC 10CC   Final    Culture  Setup Time 02/04/2012 18:05   Final    Culture     Final    Value:        BLOOD CULTURE RECEIVED NO GROWTH TO DATE CULTURE WILL BE HELD FOR 5 DAYS BEFORE ISSUING A FINAL NEGATIVE REPORT   Report Status PENDING   Incomplete   URINE CULTURE     Status: Normal   Collection Time   02/04/12 11:26 AM      Component Value Range Status Comment   Specimen Description URINE, SUPRAPUBIC   Final    Special Requests NONE   Final    Culture  Setup Time 02/04/2012 21:01   Final    Colony Count NO GROWTH   Final    Culture NO GROWTH   Final    Report Status 02/05/2012 FINAL   Final      Studies/Results: Dg Chest Port 1 View  02/04/2012  *RADIOLOGY REPORT*  Clinical Data: Fever.  Question aspiration.  History of multiple sclerosis.  PORTABLE CHEST - 1 VIEW  Comparison: 01/29/2012.  Findings: 1135 hours.  There are persistent low lung volumes.  The heart size and mediastinal contours are stable.  The lungs are clear.  There is no pleural effusion or pneumothorax.  IMPRESSION: Stable examination.  No evidence of aspiration or other acute process.   Original Report Authenticated By: Carey Bullocks, M.D.     Medications: Scheduled Meds:   . antiseptic oral rinse  15 mL Mouth Rinse q12n4p  . artificial  tears  1 application Both Eyes Q8H  . ascorbic acid  500 mg Per Tube BID  . buPROPion  100 mg Per Tube TID  . chlorhexidine  15 mL Mouth Rinse  BID  . enoxaparin (LOVENOX) injection  40 mg Subcutaneous Q24H  . feeding supplement  60 mL Per Tube BID  . free water  200 mL Per Tube 6 X Daily  . levETIRAcetam  750 mg Per Tube BID  . metoCLOPramide  5 mg Per Tube Q6H  . multivitamin  5 mL Per Tube Daily  . omega-3 acid ethyl esters  1 g Per Tube Daily  . piperacillin-tazobactam (ZOSYN)  IV  3.375 g Intravenous Q8H  . polyethylene glycol  17 g Per Tube QODAY  . tiZANidine  4 mg Per Tube Q6H   Continuous Infusions:   . feeding supplement (OSMOLITE 1.5 CAL) 1,000 mL (02/04/12 1351)   PRN Meds:.acetaminophen (TYLENOL) oral liquid 160 mg/5 mL, albuterol, guaiFENesin-dextromethorphan, ondansetron (ZOFRAN) IV, ondansetron, sodium chloride    LOS: 8 days   Murice Barbar,CHRISTOPHER  [facility] Pager [phone]. If 8PM-8AM, please contact night-coverage at [email or web address], password [facility] 02/06/2012, 11:15 AM  LOS: 8 days

## 2012-02-07 LAB — CBC
HCT: 36.3 % — ABNORMAL LOW (ref 39.0–52.0)
MCV: 89 fL (ref 78.0–100.0)
RBC: 4.08 MIL/uL — ABNORMAL LOW (ref 4.22–5.81)
WBC: 6.7 10*3/uL (ref 4.0–10.5)

## 2012-02-07 LAB — COMPREHENSIVE METABOLIC PANEL
BUN: 16 mg/dL (ref 6–23)
CO2: 24 mEq/L (ref 19–32)
Chloride: 101 mEq/L (ref 96–112)
Creatinine, Ser: 0.67 mg/dL (ref 0.50–1.35)
GFR calc non Af Amer: >90 mL/min (ref >90)
Total Bilirubin: 0.3 mg/dL (ref 0.3–1.2)

## 2012-02-07 LAB — GLUCOSE, CAPILLARY: Glucose-Capillary: 85 mg/dL (ref 70–99)

## 2012-02-07 MED ORDER — FINGOLIMOD HCL 0.5 MG PO CAPS
1.0000 | ORAL_CAPSULE | Freq: Every day | ORAL | Status: DC
  Administered 2012-02-07 – 2012-02-08 (×2): 0.5 mg via ORAL

## 2012-02-07 NOTE — Discharge Summary (Signed)
Physician Discharge Summary  Terry Palmer ZOX:[id number] DOB: December 08, 1975 DOA: 01/29/2012  PCP: Dorrene German, MD  Admit date: 01/29/2012 Discharge date: 02/08/2012  Time spent: 40 minutes  Recommendations for Outpatient Follow-up:  1. Follow up with Primary MD. 2. Follow up with SNF MD.   Discharge Diagnoses:  Principal Problem:  *Sepsis(995.91) Active Problems:  Quadriparesis (muscle weakness)  UTI (lower urinary tract infection)  Multiple sclerosis  Protein calorie malnutrition  Sacral decubitus ulcer, stage II  Neurogenic bladder  Encephalopathy, toxic  Hypokalemia   Discharge Condition: Satisfactory.  Diet recommendation: Osmolite 1.5 cal tube feed.   Filed Weights   01/29/12 1149 01/29/12 1404  Weight: 66.2 kg (145 lb 15.1 oz) 83.1 kg (183 lb 3.2 oz)    History of present illness:  37 year old man with a history of disabling multiple sclerosis and resultant quadriparesis, severe dysphagia with a PEG tube in place, history of neurogenic bladder s/p suprapubic catheter, who was brought to the hospital for evaluation of altered mental status and fever. Patient is a resident of a local skilled nursing facility. Per the patient's mother, since approximately 2 days prior, patient started having fever and purulent discharge from his urine was noted in the urinary bag. Apparently a urinalysis was done at his skilled nursing facility, that was "negative" for UTI. His Foley catheter was changed 01/28/2012. Over the next 24 hours, patient got more lethargic, and started moaning and groaning. He was then brought to the emergency room, where he was found to have a urinary tract infection and a persistent tachycardia. Blood pressure was stable. He was admitted for further management.    Hospital Course:  1. Sepsis/Fever:  Patient presented with altered mental status, described as lethargy, as well as a pyrexia. Source was suspected to be from UTI/indwelling suprapubic catheter.  Chest x-ray was negative and sacral decubitus was dry without features of infection. He was initially started on IV Cefepime, Levaquin and Vancomycin. Final urine culture results showed Proteus, and antibiotics were narrowed to Rocephin. After a few days of being afebrile, patient again had intermittent low-grade fevers (MAXIMUM TEMPERATURE 100.5F) and altered mental status on 02/05/12. No cough. Chest x-ray negative. UA showed decrease in white blood cell compared to prior results, but still many bacteria (? Colonization). Suprapubic catheter site was without pus or features of cellulitis. Repeat blood cultures, urine cultures were obtained, and antibiotics changed to iv Zosyn on 02/05/12. As of 02/07/12, patient had completed 10 days of parenteral antibiotic therapy. As described above, initial urine culture grew Proteus Mirabilis, sensitive to the cephalosporins and Zosyn, but resistant to the quinolones. Patient has remained afebrile, since Zosyn was started. Blood cultures have remained negative so far. Transitioned to oral Augumentin on 02/07/12, for a further 4 days of antibiotic therapy, to be concluded on 02/10/12.  2. Altered mental status/toxic metabolite encephalopathy:  This was presumed to be secondary to sepsis/UTI. His mental status improved, and as of 02/02/12, he was alert and interacting with family. EEG did not show seizure-like activity. Patient is not on sedative medications. Mental status has waxed and waned, since, but patient was alert and interactive in AM of 02/06/12, and has remained stable, since. Likely, the etiology is delirium.  3. UTI (lower urinary tract infection): This is due to Proteus mirabilis. Chronic suprapubic catheter was changed during this hospitalization. Management as described above.  4. Hypokalemia: Repleted as indicated.  5. Sacral decubitus ulcer, stage II: Managed per wound care team recommendations. Has air overlay mattress. No features  of active infection.  6.  Protein calorie malnutrition: Continued on PEG feeding, which he is tolerating well.  7. Multiple sclerosis: Patient is quadriparetic, and has neurogenic bladder as well as dysphagia, requiring PEG tube feeding and chronic suprapubic Foley catheter. All four extremities are contracted from multiple sclerosis. Evaluated by SLP on 02/03/12, and cleared to resume oral Gilenya. Recommenced on 02/07/12.  9. Seizure disorder: There was some concern of on-going seizures as cause for altered mental status. EEG was repeated and did not show seizure like activity. Dr Ritta Slot provided neurology consultation, and recommended increasing Keppra to 750 mg via PEG twice a day. This was been implemented.   Comment: Dr Derenda Mis provided Palliative Care Medicine consultation, and GOC meeting with family was held on 02/01/12. Family opted for continued aggressive care and FULL CODE status.     Procedures:  See Below.   Consultations: Dr Ritta Slot, neurologist.  Dr Derenda Mis, Palliative Care Medicine.   Discharge Exam: Filed Vitals:   02/07/12 0615 02/07/12 1405 02/07/12 2131 02/08/12 0500  BP: 112/74 122/73 116/74 140/89  Pulse: 102 105 90 92  Temp: 98.4 F (36.9 C) 99 F (37.2 C) 98.2 F (36.8 C) 98.2 F (36.8 C)  TempSrc: Oral Axillary    Resp: 18 18 16 17   Height:      Weight:      SpO2: 100% 97% 100% 98%    General: Comfortable, alert, not communicative, not short of breath at rest.  HEENT: No clinical pallor, no jaundice, no conjunctival injection or discharge. Hydration status is fair.  NECK: Supple, JVP not seen, no carotid bruits, no palpable lymphadenopathy, no palpable goiter.  CHEST: Clinically clear to auscultation, no wheezes, no crackles.  HEART: Sounds 1 and 2 heard, normal, regular, no murmurs.  ABDOMEN: Full, soft, non-tender, no palpable organomegaly, no palpable masses, normal bowel sounds. PEG site is clean and otherwise, unremarkable.  GENITALIA:  Not examined.  LOWER EXTREMITIES: No pitting edema, palpable peripheral pulses.  MUSCULOSKELETAL SYSTEM: Bilateral UE/LE flexion contractures.  CENTRAL NERVOUS SYSTEM: Quadriparetic.  Discharge Instructions      Discharge Orders    Future Orders Please Complete By Expires   Diet - low sodium heart healthy      Increase activity slowly          Medication List     As of 02/08/2012 12:44 PM    STOP taking these medications         levETIRAcetam 500 MG tablet   Commonly known as: KEPPRA      TAKE these medications         acetaminophen 160 MG/5ML solution   Commonly known as: TYLENOL   160 mg by PEG Tube route every 6 (six) hours as needed. For pain.      amoxicillin-clavulanate 875-125 MG per tablet   Commonly known as: AUGMENTIN   Place 1 tablet into feeding tube every 12 (twelve) hours.      artificial tears Oint ophthalmic ointment   Apply 1 application to eye every 8 (eight) hours. For dry eyes.      ascorbic acid 500 MG/5ML syrup   Commonly known as: VITAMIN C   500 mg by PEG Tube route 2 (two) times daily.      buPROPion 100 MG tablet   Commonly known as: WELLBUTRIN   100 mg by PEG Tube route 3 (three) times daily.      cholecalciferol 1000 UNITS tablet   Commonly known as: VITAMIN D  1,000 Units by PEG Tube route 2 (two) times daily.      Cranberry 475 MG Caps   1 capsule by PEG Tube route 2 (two) times daily.      feeding supplement (OSMOLITE 1.5 CAL) Liqd   Place 10,000 mLs into feeding tube continuous.      feeding supplement Liqd   Place 60 mLs into feeding tube 2 (two) times daily.      free water Soln   Place 200 mLs into feeding tube 6 (six) times daily.      Garlic 500 MG Caps   1,000 mg by PEG Tube route every morning.      GILENYA 0.5 MG Caps   Generic drug: Fingolimod HCl   Take 1 capsule by mouth daily.      levETIRAcetam 100 MG/ML solution   Commonly known as: KEPPRA   Place 7.5 mLs (750 mg total) into feeding tube 2 (two)  times daily.      metoCLOPramide 5 MG/5ML solution   Commonly known as: REGLAN   Place 5 mg into feeding tube every 6 (six) hours.      multivitamin with minerals Tabs   1 tablet by PEG Tube route every morning.      omega-3 acid ethyl esters 1 G capsule   Commonly known as: LOVAZA   1 g by PEG Tube route every morning.      polyethylene glycol packet   Commonly known as: MIRALAX / GLYCOLAX   17 g by PEG Tube route every other day.      tiZANidine 2 MG tablet   Commonly known as: ZANAFLEX   4 mg by PEG Tube route every 6 (six) hours.      UTI-Stat Liqd   30 mLs by PEG Tube route 2 (two) times daily.         Follow-up Information    Follow up with Dorrene German, MD.   Contact information:   [address] [place] [address] [phone]       Please follow up. (Follow up with SNF MD. )           The results of significant diagnostics from this hospitalization (including imaging, microbiology, ancillary and laboratory) are listed below for reference.    Significant Diagnostic Studies: Ct Abdomen Pelvis Wo Contrast  01/29/2012  *RADIOLOGY REPORT*  Clinical Data: Fever.  CT ABDOMEN AND PELVIS WITHOUT CONTRAST  Technique:  Multidetector CT imaging of the abdomen and pelvis was performed following the standard protocol without intravenous contrast.  Comparison: CT of the abdomen and pelvis performed 03/12/2011  Findings: Minimal bibasilar atelectasis is noted.  The visualized portions of the liver and spleen are unremarkable in appearance.  The gallbladder is within normal limits.  The pancreas and adrenal glands are unremarkable.  The kidneys are unremarkable in appearance.  There is no evidence of hydronephrosis.  No renal or ureteral stones are seen.  No perinephric stranding is appreciated.  No free fluid is identified.  The small bowel is unremarkable in appearance.  The stomach is within normal limits.  A G-tube is noted ending at the antrum of the stomach.  No  acute vascular abnormalities are seen.  Minimal calcification is noted along the distal abdominal aorta.  The appendix is normal in caliber, without evidence for appendicitis.  The visualized portions of the colon are unremarkable in appearance.  The bladder is decompressed, with a suprapubic catheter in place. The prostate is not well characterized.  No inguinal  lymphadenopathy is seen.  There is posterior dislocation or marked subluxation of the right hip joint, new from the prior studies, with minimal associated cortical irregularity at the femoral head.  An associated joint effusion is suggested.  IMPRESSION:  1.  No definite findings seen to explain the patient's symptoms; the patient's known neurogenic bladder is decompressed and not well assessed. 2.  Posterior dislocation or marked subluxation of the right hip joint, new from the prior studies, with minimal associated cortical irregularity at the femoral head.  Associated hip joint effusion suggested.  These results were called by telephone on 01/29/2012 at 06:02 a.m. to Dr. Mendel Ryder, who verbally acknowledged these results.   Original Report Authenticated By: Tonia Ghent, M.D.    [facility] 1 View  02/04/2012  *RADIOLOGY REPORT*  Clinical Data: Fever.  Question aspiration.  History of multiple sclerosis.  PORTABLE CHEST - 1 VIEW  Comparison: 01/29/2012.  Findings: 1135 hours.  There are persistent low lung volumes.  The heart size and mediastinal contours are stable.  The lungs are clear.  There is no pleural effusion or pneumothorax.  IMPRESSION: Stable examination.  No evidence of aspiration or other acute process.   Original Report Authenticated By: Carey Bullocks, M.D.    Dg Chest Port 1 View  01/29/2012  *RADIOLOGY REPORT*  Clinical Data: Fever and cough.  PORTABLE CHEST - 1 VIEW  Comparison: Chest radiograph performed 05/27/2011  Findings: The lungs are mildly hypoexpanded but appear clear. There is no evidence of focal  opacification, pleural effusion or pneumothorax.  The cardiomediastinal silhouette is within normal limits.  No acute osseous abnormalities are seen.  IMPRESSION: Mildly hypoexpanded but clear lungs.   Original Report Authenticated By: Tonia Ghent, M.D.     Microbiology: Recent Results (from the past 240 hour(s))  MRSA PCR SCREENING     Status: Normal   Collection Time   01/29/12  2:05 PM      Component Value Range Status Comment   MRSA by PCR NEGATIVE  NEGATIVE Final   CULTURE, BLOOD (ROUTINE X 2)     Status: Normal (Preliminary result)   Collection Time   02/04/12 11:15 AM      Component Value Range Status Comment   Specimen Description BLOOD RIGHT HAND   Final    Special Requests BOTTLES DRAWN AEROBIC AND ANAEROBIC 10CC   Final    Culture  Setup Time 02/04/2012 21:48   Final    Culture     Final    Value:        BLOOD CULTURE RECEIVED NO GROWTH TO DATE CULTURE WILL BE HELD FOR 5 DAYS BEFORE ISSUING A FINAL NEGATIVE REPORT   Report Status PENDING   Incomplete   CULTURE, BLOOD (ROUTINE X 2)     Status: Normal (Preliminary result)   Collection Time   02/04/12 11:20 AM      Component Value Range Status Comment   Specimen Description BLOOD RIGHT ARM   Final    Special Requests BOTTLES DRAWN AEROBIC AND ANAEROBIC 10CC   Final    Culture  Setup Time 02/04/2012 18:05   Final    Culture     Final    Value:        BLOOD CULTURE RECEIVED NO GROWTH TO DATE CULTURE WILL BE HELD FOR 5 DAYS BEFORE ISSUING A FINAL NEGATIVE REPORT   Report Status PENDING   Incomplete   URINE CULTURE     Status: Normal   Collection Time  02/04/12 11:26 AM      Component Value Range Status Comment   Specimen Description URINE, SUPRAPUBIC   Final    Special Requests NONE   Final    Culture  Setup Time 02/04/2012 21:01   Final    Colony Count NO GROWTH   Final    Culture NO GROWTH   Final    Report Status 02/05/2012 FINAL   Final      Labs: Basic Metabolic Panel:  Lab 02/08/12 1610 02/07/12 0545 02/06/12  [id number] 02/05/12 0650 02/03/12 0618 02/02/12 0605 02/01/12 1523  NA 139 138 140 -- 136 142 --  K 3.6 3.5 3.8 -- 3.7 3.4* --  CL 104 101 103 -- 100 104 --  CO2 25 24 26  -- 24 27 --  GLUCOSE 95 95 102* -- 90 106* --  BUN 15 16 15  -- 15 15 --  CREATININE 0.70 0.67 0.67 0.66 0.50 -- --  CALCIUM 9.3 9.2 9.3 -- 9.3 9.1 --  MG -- -- -- -- -- -- 2.2  PHOS -- -- -- -- -- -- --   Liver Function Tests:  Lab 02/07/12 0545  AST 19  ALT 26  ALKPHOS 87  BILITOT 0.3  PROT 7.0  ALBUMIN 3.6   No results found for this basename: LIPASE:5,AMYLASE:5 in the last 168 hours No results found for this basename: AMMONIA:5 in the last 168 hours CBC:  Lab 02/08/12 0528 02/07/12 0545 02/06/12 0508 02/04/12 1250 02/03/12 0618  WBC 5.7 6.7 7.1 10.9* 7.6  NEUTROABS -- -- -- 7.9* --  HGB 12.5* 11.8* 12.9* 12.4* 12.5*  HCT 37.6* 36.3* 38.3* 37.1* 36.6*  MCV 89.7 89.0 88.9 88.3 88.2  PLT 404* 368 366 350 291   Cardiac Enzymes: No results found for this basename: CKTOTAL:5,CKMB:5,CKMBINDEX:5,TROPONINI:5 in the last 168 hours BNP: BNP (last 3 results) No results found for this basename: PROBNP:3 in the last 8760 hours CBG:  Lab 02/08/12 0811 02/07/12 0744 02/06/12 0746 02/05/12 0803  GLUCAP 98 85 100* 113*       Signed:  Amonte Brookover,CHRISTOPHER  [facility] 02/08/2012, 12:44 PM

## 2012-02-07 NOTE — Progress Notes (Signed)
[facility] PROGRESS NOTE  HARI CASAUS ZOX:[id number] DOB: Apr 01, 1975 DOA: 01/29/2012 PCP: Dorrene German, MD  Assessment/Plan: Principal Problem:  *Sepsis(995.91) Active Problems:  Quadriparesis (muscle weakness)  UTI (lower urinary tract infection)  Multiple sclerosis  Protein calorie malnutrition  Sacral decubitus ulcer, stage II  Neurogenic bladder  Encephalopathy, toxic  Hypokalemia    1. Sepsis/Fever:  Patient presented with altered mental status, described as lethargy, as well as a pyrexia. Source was suspected to be from UTI/indwelling suprapubic catheter. Chest x-ray was negative and sacral decubitus was dry without features of infection. He was initially started on IV Cefepime, Levaquin and Vancomycin. Final urine culture results showed Proteus and antibiotics were narrowed to Rocephin. After a few days of being afebrile, patient again had intermittent low-grade fevers (MAXIMUM TEMPERATURE 100.77F) and altered MS on 02/05/12. No cough. Chest x-ray negative. UA showed decrease in white blood cell compared to prior results, but still many bacteria (? Colonization). Suprapubic catheter site was without pus or features of cellulitis. Repeat blood cultures, urine cultures were obtained Antibiotics were changed to iv Zosyn on 02/05/12. Patient is now on day# 9 of antibiotic therapy, initial urine culture grew Proteus Mirabilis, sensitive to the cephalosporins and Zosyn, but resistant to the quinolones. Patient has remained afebrile, since Zosyn was started. Blood cultures are negative so far. Have transitioned to oral Augumentin today, for a further 4 days of antibiotic therapy, to be concluded on 02/10/12.  2. Altered mental status/toxic metabolite encephalopathy: This was presumed to be secondary to sepsis/UTI. His mental status improved, and as of 02/02/12, he was alert and interacting with family. EEG did not show seizure-like activity. Patient is not on sedative medications.  Mental status has waxed and waned, since, but patient was alert and interactive in AM of 02/06/12, and has remained stable, since. Likely, the etiology is delirium.  3. UTI (lower urinary tract infection): This is due to Proteus mirabilis. Chronic suprapubic catheter was changed a week ago. Management as described. Above.  4. Hypokalemia: Repleted as indicated.  5. Sacral decubitus ulcer, stage II:  Managed per wound care team recommendations. Has air overlay mattress. No features of active infection.  6. Protein calorie malnutrition:  Continued on PEG feeding, which he is tolerating well.  7. Neurogenic bladder:  Secondary to multiple sclerosis. Has chronic suprapubic catheter.  8. Multiple sclerosis:  Patient is quadriparetic, and has neurogenic bladder as well as dysphagia, requiring PEG tube feeding and chronic Foley catheter. All four extremities are contracted from multiple sclerosis. Evaluated by SLP on 02/03/12, and cleared to resume oral Ginelya. Recommenced on 02/07/12.   9. Seizure disorder:  There was some concern of on-going seizures as cause for altered mental status. EEG was repeated and did not show seizure like activity. Dr Ritta Slot provided neurology consultation, and recommended increasing Keppra to 750 mg via PEG twice a day. This has been implemented.     Code Status: Full Code.  Family Communication:  Disposition Plan: To be determined. Family were asking social worker about transfer to [facility]. At this time no clear indication requiring transfer to [facility]. As discussed with neurology, from neurological standpoint he doesn't warrant transfer. Aiming discharge on 02/08/12.     Brief narrative: 37 year old man with a history of disabling multiple sclerosis and resultant quadriparesis, severe dysphagia with a PEG tube in place, history of neurogenic bladder s/p suprapubic catheter, who was brought to the hospital for evaluation of altered mental status and  fever. Patient is a  resident of a local skilled nursing facility. Per the patient's mother, since approximately 2 days prior, patient started having fever and purulent discharge from his urine was noted in the urinary bag. Apparently a urinalysis was done at his skilled nursing facility, that was "negative" for UTI. His Foley catheter was changed 01/28/2012. Over the next 24 hours, patient got more lethargic, and started moaning and groaning. He was then brought to the emergency room, where he was found to have a urinary tract infection and a persistent tachycardia. Blood pressure was stable. He was admitted for further management.     Consultants:  Dr Ritta Slot, neurologist.   Dr Derenda Mis, Palliative Care Medicine.   Procedures:  CXR.   Antibiotics:  Cefepime 01/29/12-01/31/12.  Ciprofloxacin 01/29/12-01/31/12.  Rocephin 01/31/12-02/03/12.  Vancomycin 01/28/12-01/31/12.  Zosyn 02/04/12>>>  HPI/Subjective: Alert today.   Objective: Vital signs in last 24 hours: Temp:  [98.4 F (36.9 C)-98.9 F (37.2 C)] 98.4 F (36.9 C) (01/30 0615) Pulse Rate:  [102-103] 102  (01/30 0615) Resp:  [18] 18  (01/30 0615) BP: (112-113)/(67-75) 112/74 mmHg (01/30 0615) SpO2:  [95 %-100 %] 100 % (01/30 0615) Weight change:  Last BM Date: 02/06/12  Intake/Output from previous day: 01/29 0701 - 01/30 0700 In: -  Out: 1550 [Urine:1550]     Physical Exam: General: Comfortable, alert, not communicative, not short of breath at rest.  HEENT:  No clinical pallor, no jaundice, no conjunctival injection or discharge. Hydration status is fair.  NECK:  Supple, JVP not seen, no carotid bruits, no palpable lymphadenopathy, no palpable goiter. CHEST:  Clinically clear to auscultation, no wheezes, no crackles. HEART:  Sounds 1 and 2 heard, normal, regular, no murmurs. ABDOMEN:  Full, soft, non-tender, no palpable organomegaly, no palpable masses, normal bowel sounds. PEG site is clean and  otherwise, unremarkable.  GENITALIA:  Not examined. LOWER EXTREMITIES:  No pitting edema, palpable peripheral pulses. MUSCULOSKELETAL SYSTEM:  Bilateral UE/LE flexion contractures. CENTRAL NERVOUS SYSTEM:  Quadriparetic.  Lab Results:  [facility] 02/07/12 0545 02/06/12 0508  WBC 6.7 7.1  HGB 11.8* 12.9*  HCT 36.3* 38.3*  PLT 368 366    Basename 02/07/12 0545 02/06/12 0508  NA 138 140  K 3.5 3.8  CL 101 103  CO2 24 26  GLUCOSE 95 102*  BUN 16 15  CREATININE 0.67 0.67  CALCIUM 9.2 9.3   Recent Results (from the past 240 hour(s))  CULTURE, BLOOD (ROUTINE X 2)     Status: Normal   Collection Time   01/29/12  3:00 AM      Component Value Range Status Comment   Specimen Description BLOOD RIGHT HAND   Final    Special Requests BOTTLES DRAWN AEROBIC ONLY 10CC   Final    Culture  Setup Time 01/29/2012 09:28   Final    Culture NO GROWTH 5 DAYS   Final    Report Status 02/04/2012 FINAL   Final   CULTURE, BLOOD (ROUTINE X 2)     Status: Normal   Collection Time   01/29/12  3:10 AM      Component Value Range Status Comment   Specimen Description BLOOD LEFT HAND   Final    Special Requests BOTTLES DRAWN AEROBIC ONLY 4CC   Final    Culture  Setup Time 01/29/2012 09:28   Final    Culture NO GROWTH 5 DAYS   Final    Report Status 02/04/2012 FINAL   Final   URINE CULTURE     Status:  Normal   Collection Time   01/29/12  3:35 AM      Component Value Range Status Comment   Specimen Description URINE, CATHETERIZED   Final    Special Requests CX ADDED AT 0407 ON 161096   Final    Culture  Setup Time 01/29/2012 09:31   Final    Colony Count >=100,000 COLONIES/ML   Final    Culture PROTEUS MIRABILIS   Final    Report Status 01/31/2012 FINAL   Final    Organism ID, Bacteria PROTEUS MIRABILIS   Final   MRSA PCR SCREENING     Status: Normal   Collection Time   01/29/12  2:05 PM      Component Value Range Status Comment   MRSA by PCR NEGATIVE  NEGATIVE Final   CULTURE, BLOOD (ROUTINE X 2)      Status: Normal (Preliminary result)   Collection Time   02/04/12 11:15 AM      Component Value Range Status Comment   Specimen Description BLOOD RIGHT HAND   Final    Special Requests BOTTLES DRAWN AEROBIC AND ANAEROBIC 10CC   Final    Culture  Setup Time 02/04/2012 21:48   Final    Culture     Final    Value:        BLOOD CULTURE RECEIVED NO GROWTH TO DATE CULTURE WILL BE HELD FOR 5 DAYS BEFORE ISSUING A FINAL NEGATIVE REPORT   Report Status PENDING   Incomplete   CULTURE, BLOOD (ROUTINE X 2)     Status: Normal (Preliminary result)   Collection Time   02/04/12 11:20 AM      Component Value Range Status Comment   Specimen Description BLOOD RIGHT ARM   Final    Special Requests BOTTLES DRAWN AEROBIC AND ANAEROBIC 10CC   Final    Culture  Setup Time 02/04/2012 18:05   Final    Culture     Final    Value:        BLOOD CULTURE RECEIVED NO GROWTH TO DATE CULTURE WILL BE HELD FOR 5 DAYS BEFORE ISSUING A FINAL NEGATIVE REPORT   Report Status PENDING   Incomplete   URINE CULTURE     Status: Normal   Collection Time   02/04/12 11:26 AM      Component Value Range Status Comment   Specimen Description URINE, SUPRAPUBIC   Final    Special Requests NONE   Final    Culture  Setup Time 02/04/2012 21:01   Final    Colony Count NO GROWTH   Final    Culture NO GROWTH   Final    Report Status 02/05/2012 FINAL   Final      Studies/Results: No results found.  Medications: Scheduled Meds:    . antiseptic oral rinse  15 mL Mouth Rinse q12n4p  . artificial tears  1 application Both Eyes Q8H  . ascorbic acid  500 mg Per Tube BID  . buPROPion  100 mg Per Tube TID  . chlorhexidine  15 mL Mouth Rinse BID  . enoxaparin (LOVENOX) injection  40 mg Subcutaneous Q24H  . feeding supplement  60 mL Per Tube BID  . Fingolimod HCl  1 capsule Oral Daily  . free water  200 mL Per Tube 6 X Daily  . levETIRAcetam  750 mg Per Tube BID  . metoCLOPramide  5 mg Per Tube Q6H  . multivitamin  5 mL Per Tube Daily  .  omega-3 acid ethyl esters  1 g Per Tube Daily  . piperacillin-tazobactam (ZOSYN)  IV  3.375 g Intravenous Q8H  . polyethylene glycol  17 g Per Tube QODAY  . tiZANidine  4 mg Per Tube Q6H   Continuous Infusions:    . feeding supplement (OSMOLITE 1.5 CAL) 1,000 mL (02/04/12 1351)   PRN Meds:.acetaminophen (TYLENOL) oral liquid 160 mg/5 mL, albuterol, guaiFENesin-dextromethorphan, ondansetron (ZOFRAN) IV, ondansetron, sodium chloride    LOS: 9 days   Faisal Stradling,CHRISTOPHER  [facility] Pager [phone]. If 8PM-8AM, please contact night-coverage at [email or web address], password [facility] 02/07/2012, 11:47 AM  LOS: 9 days

## 2012-02-07 NOTE — Clinical Social Work Note (Signed)
Clinical Child psychotherapist received call from [employer] and [employer] is no longer in network for that facility and so the patient will be under Medicaid when he returns. CSW spoke with patient's mother regarding facility no longer in network and mother reported that the patient's medicaid "should be coming soon." CSW spoke with facility and patient will still have same room, and per Marlan Palau, admissions coordinator at facility, they will complete the medicaid prior approval at their facility. CSW will continue to follow to facilitate discharge back to the facility.   Rozetta Nunnery MSW, [employer] [phone]

## 2012-02-07 NOTE — Progress Notes (Signed)
ANTIBIOTIC CONSULT NOTE - Follow Up  Pharmacy Consult for zosyn Indication: UTI  No Known Allergies  Patient Measurements: Height: 6' 2.02" (188 cm) Weight: 183 lb 3.2 oz (83.1 kg) IBW/kg (Calculated) : 82.24    Vital Signs: Temp: 98.4 F (36.9 C) (01/30 0615) Temp src: Oral (01/30 0615) BP: 112/74 mmHg (01/30 0615) Pulse Rate: 102  (01/30 0615) Intake/Output from previous day: 01/29 0701 - 01/30 0700 In: -  Out: 1550 [Urine:1550] Intake/Output from this shift:    Labs:  Basename 02/07/12 0545 02/06/12 0508 02/05/12 0650 02/04/12 1250  WBC 6.7 7.1 -- 10.9*  HGB 11.8* 12.9* -- 12.4*  PLT 368 366 -- 350  LABCREA -- -- -- --  CREATININE 0.67 0.67 0.66 --   Estimated Creatinine Clearance: 145.6 ml/min (by C-G formula based on Cr of 0.67). No results found for this basename: VANCOTROUGH:2,VANCOPEAK:2,VANCORANDOM:2,GENTTROUGH:2,GENTPEAK:2,GENTRANDOM:2,TOBRATROUGH:2,TOBRAPEAK:2,TOBRARND:2,AMIKACINPEAK:2,AMIKACINTROU:2,AMIKACIN:2, in the last 72 hours   Microbiology: Recent Results (from the past 720 hour(s))  CULTURE, BLOOD (ROUTINE X 2)     Status: Normal   Collection Time   01/29/12  3:00 AM      Component Value Range Status Comment   Specimen Description BLOOD RIGHT HAND   Final    Special Requests BOTTLES DRAWN AEROBIC ONLY 10CC   Final    Culture  Setup Time 01/29/2012 09:28   Final    Culture NO GROWTH 5 DAYS   Final    Report Status 02/04/2012 FINAL   Final   CULTURE, BLOOD (ROUTINE X 2)     Status: Normal   Collection Time   01/29/12  3:10 AM      Component Value Range Status Comment   Specimen Description BLOOD LEFT HAND   Final    Special Requests BOTTLES DRAWN AEROBIC ONLY 4CC   Final    Culture  Setup Time 01/29/2012 09:28   Final    Culture NO GROWTH 5 DAYS   Final    Report Status 02/04/2012 FINAL   Final   URINE CULTURE     Status: Normal   Collection Time   01/29/12  3:35 AM      Component Value Range Status Comment   Specimen Description URINE,  CATHETERIZED   Final    Special Requests CX ADDED AT 0407 ON 161096   Final    Culture  Setup Time 01/29/2012 09:31   Final    Colony Count >=100,000 COLONIES/ML   Final    Culture PROTEUS MIRABILIS   Final    Report Status 01/31/2012 FINAL   Final    Organism ID, Bacteria PROTEUS MIRABILIS   Final   MRSA PCR SCREENING     Status: Normal   Collection Time   01/29/12  2:05 PM      Component Value Range Status Comment   MRSA by PCR NEGATIVE  NEGATIVE Final   CULTURE, BLOOD (ROUTINE X 2)     Status: Normal (Preliminary result)   Collection Time   02/04/12 11:15 AM      Component Value Range Status Comment   Specimen Description BLOOD RIGHT HAND   Final    Special Requests BOTTLES DRAWN AEROBIC AND ANAEROBIC 10CC   Final    Culture  Setup Time 02/04/2012 21:48   Final    Culture     Final    Value:        BLOOD CULTURE RECEIVED NO GROWTH TO DATE CULTURE WILL BE HELD FOR 5 DAYS BEFORE ISSUING A FINAL NEGATIVE REPORT   Report  Status PENDING   Incomplete   CULTURE, BLOOD (ROUTINE X 2)     Status: Normal (Preliminary result)   Collection Time   02/04/12 11:20 AM      Component Value Range Status Comment   Specimen Description BLOOD RIGHT ARM   Final    Special Requests BOTTLES DRAWN AEROBIC AND ANAEROBIC 10CC   Final    Culture  Setup Time 02/04/2012 18:05   Final    Culture     Final    Value:        BLOOD CULTURE RECEIVED NO GROWTH TO DATE CULTURE WILL BE HELD FOR 5 DAYS BEFORE ISSUING A FINAL NEGATIVE REPORT   Report Status PENDING   Incomplete   URINE CULTURE     Status: Normal   Collection Time   02/04/12 11:26 AM      Component Value Range Status Comment   Specimen Description URINE, SUPRAPUBIC   Final    Special Requests NONE   Final    Culture  Setup Time 02/04/2012 21:01   Final    Colony Count NO GROWTH   Final    Culture NO GROWTH   Final    Report Status 02/05/2012 FINAL   Final     Medical History: Past Medical History  Diagnosis Date  . MS (multiple sclerosis)   .  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 Childhood asthma   . Depression   . Neuromuscular disorder     Quadraperesis  . Recurrent UTI   . Dysphagia   . Bladder calculi   . Neurogenic bladder   . Shortness of breath   . Recurrent upper respiratory infection (URI)   . Normocytic anemia 05/28/2011    Medications:  Prescriptions prior to admission  Medication Sig Dispense Refill  . acetaminophen (TYLENOL) 160 MG/5ML solution 160 mg by PEG Tube route every 6 (six) hours as needed. For pain.      Marland Kitchen ascorbic acid (VITAMIN C) 500 MG/5ML syrup 500 mg by PEG Tube route 2 (two) times daily.      Marland Kitchen buPROPion (WELLBUTRIN) 100 MG tablet 100 mg by PEG Tube route 3 (three) times daily.      . cholecalciferol (VITAMIN D) 1000 UNITS tablet 1,000 Units by PEG Tube route 2 (two) times daily.       . Cranberry 475 MG CAPS 1 capsule by PEG Tube route 2 (two) times daily.      . Cranberry-Vitamin C-Inulin (UTI-STAT) LIQD 30 mLs by PEG Tube route 2 (two) times daily.      . Fingolimod HCl (GILENYA) 0.5 MG CAPS Take 1 capsule by mouth daily.      . Garlic 500 MG CAPS 1,000 mg by PEG Tube route every morning.      . levETIRAcetam (KEPPRA) 500 MG tablet 500 mg by PEG Tube route 2 (two) times daily.      . metoCLOPramide (REGLAN) 5 MG/5ML solution Place 5 mg into feeding tube every 6 (six) hours.       . Multiple Vitamin (MULITIVITAMIN WITH MINERALS) TABS 1 tablet by PEG Tube route every morning.       . Nutritional Supplements (FEEDING SUPPLEMENT, OSMOLITE 1.5 CAL,) LIQD Place 75 mLs into feeding tube continuous.       Marland Kitchen omega-3 acid ethyl esters (LOVAZA) 1 G capsule 1 g by PEG Tube route every morning.      . polyethylene glycol (MIRALAX / GLYCOLAX) packet 17 g by PEG Tube route every other day.       Marland Kitchen  tiZANidine (ZANAFLEX) 2 MG tablet 4 mg by PEG Tube route every 6 (six) hours.      . Water For Irrigation, Sterile (FREE WATER) SOLN Place 150 mLs into feeding tube 6 (six) times daily.  500 mL  0    Assessment: 37 yo man with suprapubic catheter to change antibiotics from rocephin to zosyn for continued low grade fevers.  He has Proteus growing from [facility] 1/21.  Repeat UC 1/27 no growth. Afeb, WBC 6.7   Goal of Therapy:  Eradication of infection  Plan:  Cont Zosyn 3.375 gm IV q8 hours. F/u clinical progress.  Perl Folmar Poteet 02/07/2012,10:10 AM

## 2012-02-08 DIAGNOSIS — R5381 Other malaise: Secondary | ICD-10-CM

## 2012-02-08 DIAGNOSIS — R5383 Other fatigue: Secondary | ICD-10-CM

## 2012-02-08 LAB — BASIC METABOLIC PANEL
Chloride: 104 mEq/L (ref 96–112)
Creatinine, Ser: 0.7 mg/dL (ref 0.50–1.35)
GFR calc Af Amer: >90 mL/min (ref >90)
Potassium: 3.6 mEq/L (ref 3.5–5.1)

## 2012-02-08 LAB — CBC
Platelets: 404 10*3/uL — ABNORMAL HIGH (ref 150–400)
RDW: 14.8 % (ref 11.5–15.5)
WBC: 5.7 10*3/uL (ref 4.0–10.5)

## 2012-02-08 LAB — GLUCOSE, CAPILLARY: Glucose-Capillary: 98 mg/dL (ref 70–99)

## 2012-02-08 MED ORDER — LEVETIRACETAM 100 MG/ML PO SOLN: 750.0000 mg | Freq: Two times a day (BID) | ORAL | Status: DC

## 2012-02-08 MED ORDER — AMOXICILLIN-POT CLAVULANATE 875-125 MG PO TABS
1.0000 | ORAL_TABLET | Freq: Two times a day (BID) | ORAL | Status: DC
  Administered 2012-02-08: 1
  Filled 2012-02-08 (×2): qty 1

## 2012-02-08 MED ORDER — FREE WATER: 200.0000 mL | Freq: Every day | Status: DC

## 2012-02-08 MED ORDER — AMOXICILLIN-POT CLAVULANATE 875-125 MG PO TABS: 1.0000 | ORAL_TABLET | Freq: Two times a day (BID) | ORAL | Status: AC

## 2012-02-08 MED ORDER — OSMOLITE 1.5 CAL PO LIQD: 10000.0000 mL | ORAL | Status: DC

## 2012-02-08 MED ORDER — WHITE PETROLATUM GEL
Status: AC
  Filled 2012-02-08: qty 5

## 2012-02-08 MED ORDER — ARTIFICIAL TEARS OP OINT: 1.0000 "application " | TOPICAL_OINTMENT | Freq: Three times a day (TID) | OPHTHALMIC | Status: DC

## 2012-02-08 MED ORDER — PRO-STAT SUGAR FREE PO LIQD: 60.0000 mL | Freq: Two times a day (BID) | ORAL | Status: DC

## 2012-02-08 NOTE — Clinical Social Work Note (Signed)
Clinical Social Worker facilitated discharge by contacting facility and family, wife [name] regarding discharge today. Patient will [facility]. CSW will complete discharge packet and place with shadow chart. CSW will sign off, as social work intervention is no longer needed.   Rozetta Nunnery MSW, [employer] [phone]

## 2012-02-08 NOTE — Progress Notes (Signed)
Palliative Medicine Team SW Psychosocial follow up; pt sleeping, no family present. Have been in touch with mtr and wife throughout the week for emotional support, no acute needs. Family have my contact info as needed.   Kennieth Francois, [facility] PMT Phone [phone]

## 2012-02-08 NOTE — Progress Notes (Signed)
Progress Note from the Palliative Medicine Team at [facility]  Subjective: patient is lethargic and weak, unable to communicate verbally  -spoke with mother yesterday and open to [facility] to follow to facility and spoke with Verlon Au SW to include in FL-2, pt to be dc today to SNF-[facility]   Objective: No Known Allergies Scheduled Meds:   . amoxicillin-clavulanate  1 tablet Per Tube Q12H  . antiseptic oral rinse  15 mL Mouth Rinse q12n4p  . artificial tears  1 application Both Eyes Q8H  . ascorbic acid  500 mg Per Tube BID  . buPROPion  100 mg Per Tube TID  . chlorhexidine  15 mL Mouth Rinse BID  . enoxaparin (LOVENOX) injection  40 mg Subcutaneous Q24H  . feeding supplement  60 mL Per Tube BID  . Fingolimod HCl  1 capsule Oral Daily  . free water  200 mL Per Tube 6 X Daily  . levETIRAcetam  750 mg Per Tube BID  . metoCLOPramide  5 mg Per Tube Q6H  . multivitamin  5 mL Per Tube Daily  . omega-3 acid ethyl esters  1 g Per Tube Daily  . polyethylene glycol  17 g Per Tube QODAY  . tiZANidine  4 mg Per Tube Q6H  . white petrolatum       Continuous Infusions:   . feeding supplement (OSMOLITE 1.5 CAL) 1,000 mL (02/08/12 1006)   PRN Meds:.acetaminophen (TYLENOL) oral liquid 160 mg/5 mL, albuterol, guaiFENesin-dextromethorphan, ondansetron (ZOFRAN) IV, ondansetron, sodium chloride  BP 116/71  Pulse 89  Temp 98.6 F (37 C) (Axillary)  Resp 18  Ht 6' 2.02" (1.88 m)  Wt 83.1 kg (183 lb 3.2 oz)  BMI 23.51 kg/m2  SpO2 98%   PPS: 20%     Intake/Output Summary (Last 24 hours) at 02/08/12 1457 Last data filed at 02/08/12 1421  Gross per 24 hour  Intake    775 ml  Output   3200 ml  Net  -2425 ml      Physical Exam:  General: chronically ill appearing NAD HEENT:  + temporal muscle wasting, moist buccal membranes, no exudate noted Chest:   diminished in bases, CTA CVS: RRR Abdomen: soft NT + BS, PEG site unremarkable Ext: BLE with contractures, no edema Neuro:non  verbal, unable to follow commands  Labs: CBC    Component Value Date/Time   WBC 5.7 02/08/2012 0528   RBC 4.19* 02/08/2012 0528   HGB 12.5* 02/08/2012 0528   HCT 37.6* 02/08/2012 0528   PLT 404* 02/08/2012 0528   MCV 89.7 02/08/2012 0528   MCH 29.8 02/08/2012 0528   MCHC 33.2 02/08/2012 0528   RDW 14.8 02/08/2012 0528   LYMPHSABS 1.7 02/04/2012 1250   MONOABS 0.9 02/04/2012 1250   EOSABS 0.3 02/04/2012 1250   BASOSABS 0.0 02/04/2012 1250    BMET    Component Value Date/Time   NA 139 02/08/2012 0528   K 3.6 02/08/2012 0528   CL 104 02/08/2012 0528   CO2 25 02/08/2012 0528   GLUCOSE 95 02/08/2012 0528   BUN 15 02/08/2012 0528   CREATININE 0.70 02/08/2012 0528   CALCIUM 9.3 02/08/2012 0528   GFRNONAA >90 02/08/2012 0528   GFRAA >90 02/08/2012 0528    CMP     Component Value Date/Time   NA 139 02/08/2012 0528   K 3.6 02/08/2012 0528   CL 104 02/08/2012 0528   CO2 25 02/08/2012 0528   GLUCOSE 95 02/08/2012 0528   BUN 15 02/08/2012 0528  CREATININE 0.70 02/08/2012 0528   CALCIUM 9.3 02/08/2012 0528   PROT 7.0 02/07/2012 0545   ALBUMIN 3.6 02/07/2012 0545   AST 19 02/07/2012 0545   ALT 26 02/07/2012 0545   ALKPHOS 87 02/07/2012 0545   BILITOT 0.3 02/07/2012 0545   GFRNONAA >90 02/08/2012 0528   GFRAA >90 02/08/2012 0528     Assessment and Plan: 1.  Full code, continue with all available medical interventions to prolong life.  Mother verbalizes her son and his family's strong religious beliefs that support those directives.  Recommend Palliative care Services to follow on dc to SNF   Lorinda Creed NP  [phone]     1

## 2012-02-10 LAB — CULTURE, BLOOD (ROUTINE X 2): Culture: NO GROWTH

## 2012-05-02 ENCOUNTER — Emergency Department (HOSPITAL_COMMUNITY): Payer: Medicare HMO

## 2012-05-02 ENCOUNTER — Emergency Department (HOSPITAL_COMMUNITY)
Admission: EM | Admit: 2012-05-02 | Discharge: 2012-05-02 | Disposition: A | Discharge status: Skilled Nursing Facility | Payer: Medicare HMO | Attending: Emergency Medicine | Admitting: Emergency Medicine

## 2012-05-02 ENCOUNTER — Encounter (HOSPITAL_COMMUNITY): Payer: Self-pay | Admitting: Emergency Medicine

## 2012-05-02 DIAGNOSIS — R509 Fever, unspecified: Secondary | ICD-10-CM

## 2012-05-02 DIAGNOSIS — Z87442 Personal history of urinary calculi: Secondary | ICD-10-CM | POA: Insufficient documentation

## 2012-05-02 DIAGNOSIS — A5903 Trichomonal cystitis and urethritis: Secondary | ICD-10-CM

## 2012-05-02 DIAGNOSIS — Z79899 Other long term (current) drug therapy: Secondary | ICD-10-CM | POA: Insufficient documentation

## 2012-05-02 DIAGNOSIS — G35 Multiple sclerosis: Secondary | ICD-10-CM | POA: Insufficient documentation

## 2012-05-02 DIAGNOSIS — Z87891 Personal history of nicotine dependence: Secondary | ICD-10-CM | POA: Insufficient documentation

## 2012-05-02 DIAGNOSIS — N390 Urinary tract infection, site not specified: Secondary | ICD-10-CM

## 2012-05-02 DIAGNOSIS — Z8669 Personal history of other diseases of the nervous system and sense organs: Secondary | ICD-10-CM | POA: Insufficient documentation

## 2012-05-02 DIAGNOSIS — A5909 Other urogenital trichomoniasis: Secondary | ICD-10-CM | POA: Insufficient documentation

## 2012-05-02 DIAGNOSIS — IMO0001 Reserved for inherently not codable concepts without codable children: Secondary | ICD-10-CM

## 2012-05-02 DIAGNOSIS — G35D Multiple sclerosis, unspecified: Secondary | ICD-10-CM

## 2012-05-02 DIAGNOSIS — G8250 Quadriplegia, unspecified: Secondary | ICD-10-CM

## 2012-05-02 DIAGNOSIS — J45901 Unspecified asthma with (acute) exacerbation: Secondary | ICD-10-CM | POA: Insufficient documentation

## 2012-05-02 LAB — URINALYSIS, ROUTINE W REFLEX MICROSCOPIC
Protein, ur: 30 mg/dL — AB
Specific Gravity, Urine: 1.046 — ABNORMAL HIGH (ref 1.005–1.030)
Urobilinogen, UA: 0.2 mg/dL (ref 0.0–1.0)

## 2012-05-02 LAB — COMPREHENSIVE METABOLIC PANEL
AST: 30 U/L (ref 0–37)
CO2: 23 mEq/L (ref 19–32)
Calcium: 9.4 mg/dL (ref 8.4–10.5)
Creatinine, Ser: 0.8 mg/dL (ref 0.50–1.35)
GFR calc Af Amer: >90 mL/min (ref >90)
GFR calc non Af Amer: >90 mL/min (ref >90)
Glucose, Bld: 93 mg/dL (ref 70–99)
Total Protein: 7.8 g/dL (ref 6.0–8.3)

## 2012-05-02 LAB — CBC WITH DIFFERENTIAL/PLATELET
Basophils Absolute: 0 10*3/uL (ref 0.0–0.1)
Eosinophils Absolute: 0.1 10*3/uL (ref 0.0–0.7)
Eosinophils Relative: 1 % (ref 0–5)
HCT: 38.4 % — ABNORMAL LOW (ref 39.0–52.0)
Lymphocytes Relative: 13 % (ref 12–46)
MCH: 28.9 pg (ref 26.0–34.0)
MCV: 87.3 fL (ref 78.0–100.0)
Monocytes Absolute: 1.1 10*3/uL — ABNORMAL HIGH (ref 0.1–1.0)
RDW: 13.9 % (ref 11.5–15.5)
WBC: 7.6 10*3/uL (ref 4.0–10.5)

## 2012-05-02 LAB — URINE MICROSCOPIC-ADD ON

## 2012-05-02 MED ORDER — SODIUM CHLORIDE 0.9 % IV SOLN
1000.0000 mL | Freq: Once | INTRAVENOUS | Status: AC
  Administered 2012-05-02: 1000 mL via INTRAVENOUS

## 2012-05-02 MED ORDER — CIPROFLOXACIN 500 MG/5ML (10%) PO SUSR: 500.0000 mg | Freq: Two times a day (BID) | ORAL | Status: DC

## 2012-05-02 MED ORDER — METRONIDAZOLE 50 MG/ML ORAL SUSPENSION: 2000.0000 mg | Freq: Once | ORAL | Status: DC

## 2012-05-02 MED ORDER — SODIUM CHLORIDE 0.9 % IV SOLN
1000.0000 mL | INTRAVENOUS | Status: DC
  Administered 2012-05-02: 1000 mL via INTRAVENOUS

## 2012-05-02 MED ORDER — METRONIDAZOLE 500 MG PO TABS
2000.0000 mg | ORAL_TABLET | Freq: Once | ORAL | Status: AC
  Administered 2012-05-02: 2000 mg via ORAL
  Filled 2012-05-02: qty 4

## 2012-05-02 NOTE — ED Notes (Signed)
Pt is coming from [facility] and is a paraplegic and has been having a fever of 102 axillary gave tylenol at 3 today and pt is diaphoretic. Pt is contracted and hx of this due to his ms, pt also has a urostomy in place. Pt is not able to communicate with staff, pt is a full code.

## 2012-05-02 NOTE — ED Provider Notes (Signed)
Medical screening examination/treatment/procedure(s) were conducted as a shared visit with non-physician practitioner(s) and myself.  I personally evaluated the patient during the encounter  Patient is a resident of a nursing facility. He presents with fever. No definite source for infection but his urinalysis is abnormal. We'll start him on antibiotics to cover for potential UTI. Does not appear septic. Lactic acid is reassuring. Patient can be monitored closely nursing facility.  Celene Kras, MD 05/02/12 [phone]

## 2012-05-02 NOTE — ED Provider Notes (Signed)
History     CSN: [id number]  Arrival date & time 05/02/12  1814   First MD Initiated Contact with Patient 05/02/12 1814      Chief Complaint  Patient presents with  . Fever    (Consider location/radiation/quality/duration/timing/severity/associated sxs/prior treatment) Patient is a 37 y.o. male presenting with fever. The history is provided by the patient and medical records. No language interpreter was used.  Fever Temp source:  Axillary Severity:  Moderate Onset quality:  Gradual Duration:  2 days Timing:  Constant Progression:  Unchanged Chronicity:  New Relieved by:  Nothing Worsened by:  Nothing tried Ineffective treatments:  Acetaminophen   WINFIELD CABA is a 37 y.o. male  with a hx of MS causing his severe contractions presents to the Emergency Department complaining of gradual, persistent, progressively worsening fever onset yesterday afternoon.  Patient is unable to communicate with staff taking the level V caveat. History is obtained from the wife. Wife states yesterday when she was in she noticed that he was very diaphoretic and hot to the touch. She asked the staff to measure his fever he was found to be 102. They have been giving him Tylenol however he is been persistently diaphoretic and warm to touch. Wife states patient has a history of UTIs and sepsis and she is concerned that this case today. Patient has a G-tube, suprapubic urinary catheter.  She also states she has several decubitus ulcers on his right foot and she is concerned that these are infected.   Past Medical History  Diagnosis Date  . MS (multiple sclerosis)   . 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 Childhood asthma   . Depression   . Neuromuscular disorder     Quadraperesis  . Recurrent UTI   . Dysphagia   . Bladder calculi   . Neurogenic bladder   . Shortness of breath   . Recurrent upper respiratory infection (URI)   . Normocytic anemia 05/28/2011    Past Surgical History   Procedure Laterality Date  . Lumbar puncture  10/12/2002  . Gastrostomy  04/16/2011    Procedure: GASTROSTOMY;  Surgeon: Liz Malady, MD;  Location: [facility] OR;  Service: General;  Laterality: N/A;  Open G-Tube placement    Family History  Problem Relation Age of Onset  . Asthma Mother     History  Substance Use Topics  . Smoking status: Former Smoker    Types: Cigarettes    Quit date: 02/02/1999  . Smokeless tobacco: Former Neurosurgeon  . Alcohol Use: No      Review of Systems  Unable to perform ROS: Patient nonverbal  Constitutional: Positive for fever.    Allergies  Review of patient's allergies indicates no known allergies.  Home Medications   Current Outpatient Rx  Name  Route  Sig  Dispense  Refill  . acetaminophen (TYLENOL) 160 MG/5ML solution   PEG Tube   160 mg by PEG Tube route every 6 (six) hours as needed. For pain.         Marland Kitchen artificial tears (LACRILUBE) OINT ophthalmic ointment   Ophthalmic   Apply 1 application to eye every 8 (eight) hours. For dry eyes.   1 Tube   0   . ascorbic acid (VITAMIN C) 500 MG/5ML syrup   PEG Tube   500 mg by PEG Tube route 2 (two) times daily.         . baclofen (LIORESAL) 10 MG tablet   PEG Tube   10 mg by PEG  Tube route 4 (four) times daily as needed (as needed for muscle spasms).         Marland Kitchen buPROPion (WELLBUTRIN) 100 MG tablet   PEG Tube   100 mg by PEG Tube route 3 (three) times daily.         . cholecalciferol (VITAMIN D) 1000 UNITS tablet   PEG Tube   1,000 Units by PEG Tube route 2 (two) times daily.          . Cranberry 475 MG CAPS   PEG Tube   1 capsule by PEG Tube route 2 (two) times daily.         . Cranberry-Vitamin C-Inulin (UTI-STAT) LIQD   PEG Tube   30 mLs by PEG Tube route 2 (two) times daily.         Marland Kitchen ipratropium-albuterol (DUONEB) 0.5-2.5 (3) MG/3ML SOLN   Nebulization   Take 3 mLs by nebulization every 4 (four) hours as needed (as needed for cough and congestion).         Marland Kitchen  levETIRAcetam (KEPPRA) 100 MG/ML solution   Per Tube   Place 7.5 mLs (750 mg total) into feeding tube 2 (two) times daily.   473 mL   0   . nystatin (MYCOSTATIN/NYSTOP) 100000 UNIT/GM POWD   Topical   Apply 1 g topically daily. Apply to groin and buttocks topically every shift for yeast         . polyethylene glycol (MIRALAX / GLYCOLAX) packet   PEG Tube   17 g by PEG Tube route every other day.          . sulfamethoxazole-trimethoprim (BACTRIM DS) 800-160 MG per tablet   Oral   Take 1 tablet by mouth 2 (two) times daily as needed (as needed for disruption of wound).         . Water For Irrigation, Sterile (FREE WATER) SOLN   Per Tube   Place 200 mLs into feeding tube 6 (six) times daily.   500 mL   0   . ciprofloxacin (CIPRO) 500 MG/5ML (10%) suspension   Oral   Take 5 mLs (500 mg total) by mouth 2 (two) times daily. For 10 days   100 mL   0     BP 101/81  Pulse 92  Temp(Src) 98.4 F (36.9 C) (Rectal)  Resp 16  SpO2 97%  Physical Exam  Nursing note and vitals reviewed. Constitutional: He appears well-developed and well-nourished. No distress.  Pt with significant contractures of all 4 extremities  HENT:  Head: Normocephalic and atraumatic.  Right Ear: Tympanic membrane, external ear and ear canal normal.  Left Ear: Tympanic membrane, external ear and ear canal normal.  Nose: Nose normal. No mucosal edema or rhinorrhea.  Mouth/Throat: Oropharynx is clear and moist. No edematous. No oropharyngeal exudate, posterior oropharyngeal edema, posterior oropharyngeal erythema or tonsillar abscesses.  Eyes: Conjunctivae and EOM are normal. Pupils are equal, round, and reactive to light. No scleral icterus.  Neck: Normal range of motion. Neck supple.  Cardiovascular: Normal rate, regular rhythm, normal heart sounds and intact distal pulses.   No murmur heard. Pulmonary/Chest: Effort normal and breath sounds normal. No respiratory distress. He has no wheezes. He has no  rales. He exhibits no tenderness.  Abdominal: Soft. Bowel sounds are normal. He exhibits no distension and no mass. There is no tenderness. There is no rebound and no guarding.  Musculoskeletal: Normal range of motion. He exhibits no edema.  Lymphadenopathy:    He has no  cervical adenopathy.  Neurological: He is alert. He exhibits abnormal muscle tone. Coordination abnormal.  Patient is alert to baseline per wife though nonverbal at baseline  Skin: Skin is warm and dry. He is not diaphoretic.  Patient with small stage II decubitus ulcers on the heel of the right foot, minor amount of drainage, with very small amount of erythema, no evidence of cellulitis no induration no area of fluctuance  Psychiatric: He has a normal mood and affect.    ED Course  Procedures (including critical care time)  Labs Reviewed  CBC WITH DIFFERENTIAL - Abnormal; Notable for the following:    Hemoglobin 12.7 (*)    HCT 38.4 (*)    Monocytes Relative 14 (*)    Monocytes Absolute 1.1 (*)    All other components within normal limits  COMPREHENSIVE METABOLIC PANEL - Abnormal; Notable for the following:    BUN 24 (*)    Alkaline Phosphatase 122 (*)    All other components within normal limits  URINALYSIS, ROUTINE W REFLEX MICROSCOPIC - Abnormal; Notable for the following:    Specific Gravity, Urine 1.046 (*)    Protein, ur 30 (*)    Leukocytes, UA SMALL (*)    All other components within normal limits  CULTURE, BLOOD (ROUTINE X 2)  CULTURE, BLOOD (ROUTINE X 2)  URINE CULTURE  PROCALCITONIN  URINE MICROSCOPIC-ADD ON  CG4 I-STAT (LACTIC ACID)   Dg Chest Port 1 View  05/02/2012  *RADIOLOGY REPORT*  Clinical Data: Fever.  PORTABLE CHEST - 1 VIEW  Comparison: Chest x-ray 02/04/2012.  Findings: Lung volumes are very low.  There are bibasilar opacities favored to reflect subsegmental atelectasis.  No definite acute consolidative airspace disease.  Mild crowding of the pulmonary vasculature, accentuated by low  lung volumes, without frank pulmonary edema.  Heart size is normal.  Mediastinal contours are unremarkable.  Elevation of both hemidiaphragms, and there is a large amount of gas filled bowel (predominately colon) noted in the visualized upper abdomen.  IMPRESSION: 1.  Low lung volumes without radiographic evidence of acute cardiopulmonary disease.   Original Report Authenticated By: Trudie Reed, M.D.      1. Trichomonal cystitis   2. UTI (lower urinary tract infection)   3. Fever   4. Multiple sclerosis   5. Quadriparesis (muscle weakness)       MDM  [name] presents with fever and concerns for sepsis.  Pt without leukocytosis.  Chest x-ray without evidence of consolidation concerning for pneumonia.  Patient without elevated lactic acid and CBC with mild anemia patient's baseline. CMP mildly elevated BUN and urine is consistent with the patient being mildly dehydrated.  Patient given fluid bolus here in the department.  Urinalysis with 7-10 white blood cells and trichomonas. He'll be treated for trichomonas here in the department and discharged home with the buttocks of urinary tract infection.  Patient's father at bedside upon discharge. I discussed with him the need for the patient to be monitored in for followup with primary care physician.  Pt is afebrile here in the department but on presentation and upon reevaluation, no CVA tenderness, normotensive, and family denies N/V. There is no indication for SIRS or sepsis.  Patient vitals signs remained stable throughout his time here in the department. Pt to be dc home with antibiotics and instructions to follow up with PCP if symptoms persist.  Dr. Linwood Dibbles was consulted, evaluated this patient with me and agrees with the plan.  Dahlia Client Beulah Capobianco, PA-C 05/02/12 2202

## 2012-05-02 NOTE — ED Notes (Signed)
[id number]<AV> Expected date:<BR> Expected time:<BR> Means of arrival:<BR> Comments:<BR> Ems/ quad with fever

## 2012-05-04 LAB — URINE CULTURE

## 2012-05-08 LAB — CULTURE, BLOOD (ROUTINE X 2): Culture: NO GROWTH

## 2012-05-13 ENCOUNTER — Emergency Department (HOSPITAL_COMMUNITY): Payer: Medicare HMO

## 2012-05-13 ENCOUNTER — Encounter (HOSPITAL_COMMUNITY): Payer: Self-pay | Admitting: *Deleted

## 2012-05-13 ENCOUNTER — Emergency Department (HOSPITAL_COMMUNITY)
Admission: EM | Admit: 2012-05-13 | Discharge: 2012-05-14 | Disposition: A | Discharge status: Skilled Nursing Facility | Payer: Medicare HMO | Attending: Emergency Medicine | Admitting: Emergency Medicine

## 2012-05-13 DIAGNOSIS — Y846 Urinary catheterization as the cause of abnormal reaction of the patient, or of later complication, without mention of misadventure at the time of the procedure: Secondary | ICD-10-CM | POA: Insufficient documentation

## 2012-05-13 DIAGNOSIS — T839XXA Unspecified complication of genitourinary prosthetic device, implant and graft, initial encounter: Secondary | ICD-10-CM

## 2012-05-13 DIAGNOSIS — T83091A Other mechanical complication of indwelling urethral catheter, initial encounter: Secondary | ICD-10-CM | POA: Insufficient documentation

## 2012-05-13 DIAGNOSIS — Z931 Gastrostomy status: Secondary | ICD-10-CM | POA: Insufficient documentation

## 2012-05-13 DIAGNOSIS — Z87440 Personal history of urinary (tract) infections: Secondary | ICD-10-CM | POA: Insufficient documentation

## 2012-05-13 DIAGNOSIS — Z87891 Personal history of nicotine dependence: Secondary | ICD-10-CM | POA: Insufficient documentation

## 2012-05-13 DIAGNOSIS — J45909 Unspecified asthma, uncomplicated: Secondary | ICD-10-CM | POA: Insufficient documentation

## 2012-05-13 DIAGNOSIS — R509 Fever, unspecified: Secondary | ICD-10-CM | POA: Insufficient documentation

## 2012-05-13 DIAGNOSIS — R000 Tachycardia, unspecified: Secondary | ICD-10-CM | POA: Insufficient documentation

## 2012-05-13 DIAGNOSIS — Z8709 Personal history of other diseases of the respiratory system: Secondary | ICD-10-CM | POA: Insufficient documentation

## 2012-05-13 DIAGNOSIS — Z87442 Personal history of urinary calculi: Secondary | ICD-10-CM | POA: Insufficient documentation

## 2012-05-13 DIAGNOSIS — Z79899 Other long term (current) drug therapy: Secondary | ICD-10-CM | POA: Insufficient documentation

## 2012-05-13 DIAGNOSIS — G8250 Quadriplegia, unspecified: Secondary | ICD-10-CM | POA: Insufficient documentation

## 2012-05-13 DIAGNOSIS — Z862 Personal history of diseases of the blood and blood-forming organs and certain disorders involving the immune mechanism: Secondary | ICD-10-CM | POA: Insufficient documentation

## 2012-05-13 DIAGNOSIS — F329 Major depressive disorder, single episode, unspecified: Secondary | ICD-10-CM | POA: Insufficient documentation

## 2012-05-13 DIAGNOSIS — G35 Multiple sclerosis: Secondary | ICD-10-CM | POA: Insufficient documentation

## 2012-05-13 DIAGNOSIS — F3289 Other specified depressive episodes: Secondary | ICD-10-CM | POA: Insufficient documentation

## 2012-05-13 DIAGNOSIS — Z87448 Personal history of other diseases of urinary system: Secondary | ICD-10-CM | POA: Insufficient documentation

## 2012-05-13 LAB — CBC WITH DIFFERENTIAL/PLATELET
Basophils Relative: 0 % (ref 0–1)
Hemoglobin: 13.6 g/dL (ref 13.0–17.0)
Lymphocytes Relative: 7 % — ABNORMAL LOW (ref 12–46)
Lymphs Abs: 0.9 10*3/uL (ref 0.7–4.0)
MCHC: 34.3 g/dL (ref 30.0–36.0)
Monocytes Relative: 8 % (ref 3–12)
Neutro Abs: 10.2 10*3/uL — ABNORMAL HIGH (ref 1.7–7.7)
Neutrophils Relative %: 83 % — ABNORMAL HIGH (ref 43–77)
RBC: 4.71 MIL/uL (ref 4.22–5.81)
WBC: 12.3 10*3/uL — ABNORMAL HIGH (ref 4.0–10.5)

## 2012-05-13 LAB — BASIC METABOLIC PANEL
BUN: 19 mg/dL (ref 6–23)
Chloride: 103 mEq/L (ref 96–112)
GFR calc Af Amer: >90 mL/min (ref >90)
Potassium: 3.8 mEq/L (ref 3.5–5.1)

## 2012-05-13 MED ORDER — SODIUM CHLORIDE 0.9 % IV BOLUS (SEPSIS)
1000.0000 mL | Freq: Once | INTRAVENOUS | Status: AC
  Administered 2012-05-14: 1000 mL via INTRAVENOUS

## 2012-05-13 NOTE — ED Provider Notes (Signed)
History     CSN: [id number]  Arrival date & time 05/13/12  2153   First MD Initiated Contact with Patient 05/13/12 2202      Chief Complaint  Patient presents with  . Fever    (Consider location/radiation/quality/duration/timing/severity/associated sxs/prior treatment) HPI Comments: 39 y M with PMH of MS now quadriplegic with contracture of all extremities, s/p PEG and suprapubic catheter here after referral from his facility for a reported fever of 100.5 on tympanic thermometer.  No other issues per EMS.  No antipyretics given PTA.  Patient is a 37 y.o. male presenting with general illness. The history is provided by the EMS personnel.  Illness  The current episode started today. The onset is undetermined. The problem occurs continuously. The problem has been unchanged. The problem is mild. Associated symptoms include a fever. Pertinent negatives include no diarrhea, no vomiting and no cough.    Past Medical History  Diagnosis Date  . MS (multiple sclerosis)   . 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 Childhood asthma   . Depression   . Neuromuscular disorder     Quadraperesis  . Recurrent UTI   . Dysphagia   . Bladder calculi   . Neurogenic bladder   . Shortness of breath   . Recurrent upper respiratory infection (URI)   . Normocytic anemia 05/28/2011    Past Surgical History  Procedure Laterality Date  . Lumbar puncture  10/12/2002  . Gastrostomy  04/16/2011    Procedure: GASTROSTOMY;  Surgeon: Liz Malady, MD;  Location: [facility] OR;  Service: General;  Laterality: N/A;  Open G-Tube placement    Family History  Problem Relation Age of Onset  . Asthma Mother     History  Substance Use Topics  . Smoking status: Former Smoker    Types: Cigarettes    Quit date: 02/02/1999  . Smokeless tobacco: Former Neurosurgeon  . Alcohol Use: No      Review of Systems  Unable to perform ROS: Patient nonverbal  Constitutional: Positive for fever.  Respiratory: Negative for  cough.   Gastrointestinal: Negative for vomiting and diarrhea.    Allergies  Review of patient's allergies indicates no known allergies.  Home Medications   Current Outpatient Rx  Name  Route  Sig  Dispense  Refill  . acetaminophen (TYLENOL) 160 MG/5ML solution   PEG Tube   160 mg by PEG Tube route every 6 (six) hours as needed. For pain.         Marland Kitchen artificial tears (LACRILUBE) OINT ophthalmic ointment   Ophthalmic   Apply 1 application to eye every 8 (eight) hours. For dry eyes.   1 Tube   0   . ascorbic acid (VITAMIN C) 500 MG/5ML syrup   PEG Tube   500 mg by PEG Tube route 2 (two) times daily.         . baclofen (LIORESAL) 10 MG tablet   PEG Tube   10 mg by PEG Tube route 4 (four) times daily as needed (as needed for muscle spasms).         Marland Kitchen buPROPion (WELLBUTRIN) 100 MG tablet   PEG Tube   100 mg by PEG Tube route 3 (three) times daily.         . cholecalciferol (VITAMIN D) 1000 UNITS tablet   PEG Tube   1,000 Units by PEG Tube route 2 (two) times daily.          . ciprofloxacin (CIPRO) 500 MG/5ML (10%) suspension  Oral   Take 5 mLs (500 mg total) by mouth 2 (two) times daily. For 10 days   100 mL   0   . Cranberry 475 MG CAPS   PEG Tube   1 capsule by PEG Tube route 2 (two) times daily.         . Cranberry-Vitamin C-Inulin (UTI-STAT) LIQD   PEG Tube   30 mLs by PEG Tube route 2 (two) times daily.         Marland Kitchen ipratropium-albuterol (DUONEB) 0.5-2.5 (3) MG/3ML SOLN   Nebulization   Take 3 mLs by nebulization every 4 (four) hours as needed (as needed for cough and congestion).         Marland Kitchen levETIRAcetam (KEPPRA) 100 MG/ML solution   Per Tube   Place 7.5 mLs (750 mg total) into feeding tube 2 (two) times daily.   473 mL   0   . nystatin (MYCOSTATIN/NYSTOP) 100000 UNIT/GM POWD   Topical   Apply 1 g topically daily. Apply to groin and buttocks topically every shift for yeast         . polyethylene glycol (MIRALAX / GLYCOLAX) packet   PEG  Tube   17 g by PEG Tube route every other day.          . sulfamethoxazole-trimethoprim (BACTRIM DS) 800-160 MG per tablet   Oral   Take 1 tablet by mouth 2 (two) times daily as needed (as needed for disruption of wound).         . Water For Irrigation, Sterile (FREE WATER) SOLN   Per Tube   Place 200 mLs into feeding tube 6 (six) times daily.   500 mL   0    Device Data - BP: 114/74 mmHg (Device Time: 22:00:00) ; MAP (mmHg): 84 (Device Time: 22:00:00) ; Resp: 15 (Device Time: 22:00:01) ; SpO2: 96 % (Device Time: 22:00:01) ; ECG Heart Rate: 121 (Device Time: 22:00:01)  Temp: 99.4 F (37.4 C) ; Temp src: Rectal  Physical Exam  Vitals reviewed. Constitutional: He appears well-developed and well-nourished. No distress.  HENT:  Head: Normocephalic.  Right Ear: External ear normal.  Left Ear: External ear normal.  Nose: Nose normal.  Mouth/Throat: Mucous membranes are dry. No oropharyngeal exudate.  Eyes: Conjunctivae and EOM are normal. Pupils are equal, round, and reactive to light.  Neck: Normal range of motion. Neck supple.  Cardiovascular: Regular rhythm, normal heart sounds and intact distal pulses.  Tachycardia present.  Exam reveals no gallop and no friction rub.   No murmur heard. Pulmonary/Chest: Effort normal and breath sounds normal.  Abdominal: Soft. Bowel sounds are normal. He exhibits no distension. There is no tenderness. There is no rebound and no guarding.  PEG and suprapubic sites with no warmth/erythema/pus  Musculoskeletal: He exhibits no edema and no tenderness.  Flexion contractures and atrophy of all extremities  Neurological: He is alert. No cranial nerve deficit.  Follows commands  Skin: Skin is warm and dry.    ED Course  Procedures (including critical care time)  Labs Reviewed  CBC WITH DIFFERENTIAL - Abnormal; Notable for the following:    WBC 12.3 (*)    Neutrophils Relative 83 (*)    Neutro Abs 10.2 (*)    Lymphocytes Relative 7 (*)     All other components within normal limits  URINE CULTURE  BASIC METABOLIC PANEL  CG4 I-STAT (LACTIC ACID)   Dg Chest Portable 1 View  05/13/2012  *RADIOLOGY REPORT*  Clinical Data: 37 year old male with  fever and recent urinary tract infection.  Quadriparesis.  PORTABLE CHEST - 1 VIEW  Comparison: 05/02/2012 and earlier.  Findings: AP portable semi upright view 2227 hours.  Low mildly improved lung volumes.  Cardiac size and mediastinal contours are within normal limits.  Visualized tracheal air column is within normal limits.  Allowing for portable technique, the lungs are clear. Visualized bowel gas pattern is nonobstructed.  IMPRESSION: No acute cardiopulmonary abnormality.   Original Report Authenticated By: Erskine Speed, M.D.      1. Foley catheter problem, initial encounter   2. Fever       MDM   50 y M with PMH of MS now quadriplegic with contracture of all extremities, s/p PEG and suprapubic catheter here after referral from his facility for a reported fever of 100.5 on tympanic thermometer.  No other issues per EMS.  No antipyretics given PTA.  Afebrile rectally on arrival here.  Pt able to follow commands.  Abd soft, NT, ND, no rebound/grimace/guarding.  Stage I/II Decubitus ulcers that do not appear infected.  Will eval with CBC, BMP, CXR, lactate and send urine culture.  NS bolus.  No indication for Abx at this time given the fact that he is afebrile here and seems to be at his basline.  Urine cultures from prior visit 4/24 reviewed and grew multiple species.  Trich also noted and he received treatment for this.  12:10 AM Family has arrived and the case was discussed.  They feel that he has been straining lately.  No signs of acute abdomen.  Nursing has attempted to flush his catheter with no success.  This may be the source of the "straining" noted by family.  Will switch out catheter.  2nd NS bolus.  His family feels that he is at his baseline otherwise.  1:15 AM CXR normal.  CBC  with nonspecific leukocytosis.  HR has improved to upper 90s with fluids.  It is felt the pt is stable for discharge back to his facility after his IVF bolus finishes.  Urine culture from new catheter pending.   BP 111/80  Pulse 100  Temp(Src) 99.4 F (37.4 C) (Rectal)  Resp 16  SpO2 97%   Return precautions reviewed.  It is felt the pt is stable for d/c with close PCP f/u.  All questions answered and patient expressed understanding.  Disposition: Discharge  Condition: Good  Follow-up Information   Schedule an appointment as soon as possible for a visit with Eloisa Northern, MD.   Contact information:   [address] [place] [address] [phone]       Pt seen in conjunction with my attending, Dr. Carylon Perches, MD PGY-II [facility] Emergency Medicine Resident   Oleh Genin, MD 05/14/12 0200

## 2012-05-13 NOTE — ED Notes (Signed)
Patient presents via EMS from [facility].  Staff reports he has a fever

## 2012-05-14 NOTE — ED Notes (Signed)
Report called to Corrie Dandy, RN  At [facility].

## 2012-05-14 NOTE — ED Notes (Signed)
Suprapubic catheter that was in this patient prior to arrival was not working, would not flush.  Removed and immediate return of urine from suprapubic area

## 2012-05-14 NOTE — ED Notes (Signed)
[facility] here to transport  

## 2012-05-15 NOTE — ED Provider Notes (Signed)
I have personally seen and examined the patient.  I have discussed the plan of care with the resident.  I have reviewed the documentation on PMH/FH/Soc. History.  I have reviewed the documentation of the resident and agree.  Pt stable in the ED.  Vitals improved.  Foley changed by nursing and pt tolerated well.  Stable for d/c back to nursing facility  Joya Gaskins, MD 05/15/12 0200

## 2012-05-17 ENCOUNTER — Telehealth (HOSPITAL_COMMUNITY): Payer: Self-pay | Admitting: Emergency Medicine

## 2012-05-17 LAB — URINE CULTURE

## 2012-05-17 NOTE — ED Notes (Signed)
Post ED Visit - Positive Culture Follow-up: Successful Patient Follow-Up  Culture assessed and recommendations reviewed by: []  Wes Dulaney, Pharm.D., BCPS []  Celedonio Miyamoto, Pharm.D., BCPS [x]  Georgina Pillion, Pharm.D., BCPS []  [place], [address].D., BCPS, AAHIVP []  Estella Husk, Pharm.D., BCPS, AAHIVP  Positive urine culture  [x]  Patient discharged without antimicrobial prescription and treatment is now indicated []  Organism is resistant to prescribed ED discharge antimicrobial []  Patient with positive blood cultures  Patient resident @ [facility]  Changes discussed with ED provider: Glade Nurse PA  New antibiotic prescription Amoxicillin 500 mg PO BID x 10 days Called to [facility] RN  Contacted caregiver, date 05/17/12, time 1700   Terry Palmer 05/17/2012, 5:05 PM

## 2012-05-17 NOTE — Progress Notes (Addendum)
ED Antimicrobial Stewardship Positive Culture Follow Up   Terry Palmer is an 37 y.o. male who presented to [facility] on 05/13/2012 with a chief complaint of a clogged suprapubic catheter  Chief Complaint  Patient presents with  . Fever    Recent Results (from the past 720 hour(s))  CULTURE, BLOOD (ROUTINE X 2)     Status: None   Collection Time    05/02/12  7:04 PM      Result Value Range Status   Specimen Description BLOOD LEFT HAND   Final   Special Requests BOTTLES DRAWN AEROBIC AND ANAEROBIC   Final   Culture  Setup Time 05/02/2012 23:53   Final   Culture NO GROWTH 5 DAYS   Final   Report Status 05/08/2012 FINAL   Final  CULTURE, BLOOD (ROUTINE X 2)     Status: None   Collection Time    05/02/12  7:30 PM      Result Value Range Status   Specimen Description BLOOD RIGHT HAND   Final   Special Requests BOTTLES DRAWN AEROBIC AND ANAEROBIC 2CC   Final   Culture  Setup Time 05/02/2012 23:53   Final   Culture NO GROWTH 5 DAYS   Final   Report Status 05/08/2012 FINAL   Final  URINE CULTURE     Status: None   Collection Time    05/02/12  8:37 PM      Result Value Range Status   Specimen Description URINE, SUPRAPUBIC   Final   Special Requests NONE   Final   Culture  Setup Time 05/03/2012 02:48   Final   Colony Count 70,000 COLONIES/ML   Final   Culture     Final   Value: Multiple bacterial morphotypes present, none predominant. Suggest appropriate recollection if clinically indicated.   Report Status 05/04/2012 FINAL   Final  URINE CULTURE     Status: None   Collection Time    05/14/12 12:29 AM      Result Value Range Status   Specimen Description URINE, CATHETERIZED   Final   Special Requests NONE   Final   Culture  Setup Time 05/14/2012 10:46   Final   Colony Count >=100,000 COLONIES/ML   Final   Culture     Final   Value: PROTEUS MIRABILIS     GRAM NEGATIVE RODS   Report Status PENDING   Incomplete   Organism ID, Bacteria PROTEUS MIRABILIS   Final    []   Treated with x, organism resistant to prescribed antimicrobial [x]  Patient discharged originally without antimicrobial agent and treatment is now indicated  The patient is a quadraplegic and has a suprapubic catheter. 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   The cultures came back on 2 separate days -- the first culture (resulted on 5/10) was sensitive to Amoxicillin and a Rx was sent for this to the NH. The second culture that came back today (5/11) was resistant to several antibiotics, including amoxicillin -- so the decision was made to switch to something that would treat both cultures.  New antibiotic prescription: Fosfomycin 3g every 3 days x 3 doses  ED Provider: Glade Nurse (5/10) and  Dierdre Forth, PA-C (5/11)  [name] 05/17/2012, 4:42 PM Infectious Diseases Pharmacist Phone# [phone]

## 2012-10-01 ENCOUNTER — Emergency Department (HOSPITAL_COMMUNITY)
Admission: EM | Admit: 2012-10-01 | Discharge: 2012-10-02 | Disposition: A | Discharge status: Home / Self Care | Payer: Medicare HMO | Attending: Emergency Medicine | Admitting: Emergency Medicine

## 2012-10-01 ENCOUNTER — Encounter (HOSPITAL_COMMUNITY): Payer: Self-pay | Admitting: *Deleted

## 2012-10-01 DIAGNOSIS — Z87442 Personal history of urinary calculi: Secondary | ICD-10-CM | POA: Insufficient documentation

## 2012-10-01 DIAGNOSIS — Z79899 Other long term (current) drug therapy: Secondary | ICD-10-CM | POA: Insufficient documentation

## 2012-10-01 DIAGNOSIS — Z8669 Personal history of other diseases of the nervous system and sense organs: Secondary | ICD-10-CM | POA: Insufficient documentation

## 2012-10-01 DIAGNOSIS — F3289 Other specified depressive episodes: Secondary | ICD-10-CM | POA: Insufficient documentation

## 2012-10-01 DIAGNOSIS — J45909 Unspecified asthma, uncomplicated: Secondary | ICD-10-CM | POA: Insufficient documentation

## 2012-10-01 DIAGNOSIS — E860 Dehydration: Secondary | ICD-10-CM | POA: Insufficient documentation

## 2012-10-01 DIAGNOSIS — Z87448 Personal history of other diseases of urinary system: Secondary | ICD-10-CM | POA: Insufficient documentation

## 2012-10-01 DIAGNOSIS — Z87440 Personal history of urinary (tract) infections: Secondary | ICD-10-CM | POA: Insufficient documentation

## 2012-10-01 DIAGNOSIS — F329 Major depressive disorder, single episode, unspecified: Secondary | ICD-10-CM | POA: Insufficient documentation

## 2012-10-01 DIAGNOSIS — Z862 Personal history of diseases of the blood and blood-forming organs and certain disorders involving the immune mechanism: Secondary | ICD-10-CM | POA: Insufficient documentation

## 2012-10-01 DIAGNOSIS — Z87891 Personal history of nicotine dependence: Secondary | ICD-10-CM | POA: Insufficient documentation

## 2012-10-01 LAB — URINALYSIS, ROUTINE W REFLEX MICROSCOPIC
Nitrite: NEGATIVE
Specific Gravity, Urine: 1.042 — ABNORMAL HIGH (ref 1.005–1.030)
Urobilinogen, UA: 0.2 mg/dL (ref 0.0–1.0)

## 2012-10-01 LAB — URINE MICROSCOPIC-ADD ON

## 2012-10-01 LAB — CBC WITH DIFFERENTIAL/PLATELET
Basophils Relative: 0 % (ref 0–1)
Eosinophils Absolute: 0.6 10*3/uL (ref 0.0–0.7)
Lymphs Abs: 2.2 10*3/uL (ref 0.7–4.0)
MCH: 29.7 pg (ref 26.0–34.0)
Neutro Abs: 6.3 10*3/uL (ref 1.7–7.7)
Neutrophils Relative %: 63 % (ref 43–77)
Platelets: 279 10*3/uL (ref 150–400)
RBC: 4.68 MIL/uL (ref 4.22–5.81)

## 2012-10-01 LAB — BASIC METABOLIC PANEL
Chloride: 101 mEq/L (ref 96–112)
GFR calc Af Amer: >90 mL/min (ref >90)
GFR calc non Af Amer: >90 mL/min (ref >90)
Glucose, Bld: 77 mg/dL (ref 70–99)
Potassium: 3.8 mEq/L (ref 3.5–5.1)
Sodium: 138 mEq/L (ref 135–145)

## 2012-10-01 MED ORDER — SODIUM CHLORIDE 0.9 % IV BOLUS (SEPSIS)
1000.0000 mL | Freq: Once | INTRAVENOUS | Status: AC
  Administered 2012-10-01: 1000 mL via INTRAVENOUS

## 2012-10-01 NOTE — ED Notes (Signed)
Per facility staff, pt's mother has been at the facility with pt all day and mother told staff that the pt has had very little urine output today and mother refused to leave until pt was transferred to ED or pt voided. From 1500 until 2200 pt has only had an output of 200 ml of output via suprapubic catheter. Mother requesting transport to ED d/t decreased output. Facility attempted to flush and reinsert suprapubic cath without change in pt condition.

## 2012-10-01 NOTE — ED Notes (Signed)
Bed: NW29 Expected date:  Expected time:  Means of arrival:  Comments: EMS 36yo M from [facility], urinary retention, dec output

## 2012-10-01 NOTE — ED Provider Notes (Signed)
CSN: [id number]     Arrival date & time 10/01/12  2200 History   First MD Initiated Contact with Patient 10/01/12 2215     Chief Complaint  Patient presents with  . Urinary Retention   (Consider location/radiation/quality/duration/timing/severity/associated sxs/prior Treatment) HPI Comments: Terry Palmer is a 37 y.o. Male who presents for evaluation from his nursing care facility. Today, his mother was worried that he had decreased urinary output. A nurse at this facility changed his suprapubic catheter; but apparently there was minimal urinary output, afterwards- "200 cc". The patient is unable to contribute to history.  L5 caveat- Encephalopathy   The history is provided by the patient.    Past Medical History  Diagnosis Date  . MS (multiple sclerosis)   . 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 Childhood asthma   . Depression   . Neuromuscular disorder     Quadraperesis  . Recurrent UTI   . Dysphagia   . Bladder calculi   . Neurogenic bladder   . Shortness of breath   . Recurrent upper respiratory infection (URI)   . Normocytic anemia 05/28/2011   Past Surgical History  Procedure Laterality Date  . Lumbar puncture  10/12/2002  . Gastrostomy  04/16/2011    Procedure: GASTROSTOMY;  Surgeon: Liz Malady, MD;  Location: [facility] OR;  Service: General;  Laterality: N/A;  Open G-Tube placement   Family History  Problem Relation Age of Onset  . Asthma Mother    History  Substance Use Topics  . Smoking status: Former Smoker    Types: Cigarettes    Quit date: 02/02/1999  . Smokeless tobacco: Former Neurosurgeon  . Alcohol Use: No    Review of Systems  Unable to perform ROS   Allergies  Review of patient's allergies indicates no known allergies.  Home Medications   Current Outpatient Rx  Name  Route  Sig  Dispense  Refill  . artificial tears (LACRILUBE) OINT ophthalmic ointment   Ophthalmic   Apply 1 application to eye every 8 (eight) hours. For dry eyes.   1  Tube   0   . ascorbic acid (VITAMIN C) 500 MG/5ML syrup   PEG Tube   500 mg by PEG Tube route 2 (two) times daily.         . baclofen (LIORESAL) 10 MG tablet   PEG Tube   10 mg by PEG Tube route 4 (four) times daily as needed (as needed for muscle spasms).         Marland Kitchen buPROPion (WELLBUTRIN) 100 MG tablet   PEG Tube   100 mg by PEG Tube route 3 (three) times daily.         . cholecalciferol (VITAMIN D) 1000 UNITS tablet   PEG Tube   1,000 Units by PEG Tube route 2 (two) times daily.          . Cranberry 475 MG CAPS   PEG Tube   1 capsule by PEG Tube route 2 (two) times daily.         Marland Kitchen docusate (COLACE) 50 MG/5ML liquid   PEG Tube   100 mg by PEG Tube route daily.         . Fingolimod HCl (GILENYA) 0.5 MG CAPS   PEG Tube   1 capsule by PEG Tube route daily.         Marland Kitchen levETIRAcetam (KEPPRA) 100 MG/ML solution   Per Tube   Place 7.5 mLs (750 mg total) into feeding tube 2 (two)  times daily.   473 mL   0   . loratadine (CLARITIN) 10 MG tablet   PEG Tube   10 mg by PEG Tube route daily.         . Multiple Vitamin (MULTIVITAMIN WITH MINERALS) TABS   PEG Tube   1 tablet by PEG Tube route daily.         . Nutritional Supplements (FEEDING SUPPLEMENT, OSMOLITE 1.5 CAL,) LIQD   Per Tube   Place into feeding tube continuous. 74ml/HR         . nystatin (MYCOSTATIN/NYSTOP) 100000 UNIT/GM POWD   Topical   Apply 1 g topically daily. Apply to groin and buttocks topically every shift for yeast         . ODORLESS GARLIC PO   PEG Tube   1,000 mg by PEG Tube route daily. 500 mg caps         . polyethylene glycol (MIRALAX / GLYCOLAX) packet   PEG Tube   17 g by PEG Tube route every other day.          . sodium chloride (OCEAN) 0.65 % nasal spray   Nasal   Place 1 spray into the nose 2 (two) times daily as needed for congestion.         Marland Kitchen ipratropium-albuterol (DUONEB) 0.5-2.5 (3) MG/3ML SOLN   Nebulization   Take 3 mLs by nebulization every 4  (four) hours as needed (as needed for cough and congestion).         Marland Kitchen sulfamethoxazole-trimethoprim (BACTRIM DS) 800-160 MG per tablet   Oral   Take 1 tablet by mouth 2 (two) times daily as needed (as needed for disruption of wound).          BP 112/77  Pulse 86  Temp(Src) 97.7 F (36.5 C) (Rectal)  SpO2 96% Physical Exam  Nursing note and vitals reviewed. Constitutional: He appears well-developed. He appears distressed.  Ill appearing  HENT:  Head: Normocephalic and atraumatic.  Right Ear: External ear normal.  Left Ear: External ear normal.  Dry mucous membranes  Eyes: Conjunctivae and EOM are normal. Pupils are equal, round, and reactive to light.  Neck: Normal range of motion and phonation normal. Neck supple.  Cardiovascular: Normal rate, regular rhythm, normal heart sounds and intact distal pulses.   Pulmonary/Chest: Effort normal and breath sounds normal. He exhibits no bony tenderness.  Abdominal: Soft. Normal appearance. He exhibits no mass. There is no tenderness. There is no guarding.  Gastric tube ostomy and suprapubic tube ostomies appear normal. The catheters in each appear in good repair.  Musculoskeletal: Normal range of motion.  Neurological: He is alert. He has normal strength. No cranial nerve deficit or sensory deficit. He exhibits normal muscle tone. Coordination normal.  Obtunded, quadriparetic  Skin: Skin is warm, dry and intact.    ED Course  Procedures (including critical care time)  Bladder scan less than 15 cc  IV fluids, ordered  22:30 case discussed with mother, who has just arrived. She understands the planned treatment and evaluation.  12:13 AM Reevaluation with update and discussion. After initial assessment and treatment, an updated evaluation reveals he continues to have some urinary output. A second liter of saline is ordered. His mother was concerned that he is uncomfortable, but he indicated to her that he was not. He stopped  groaning spontaneously. D/C plan discussed with mother, and she agrees.Deshae Dickison L   Labs Review Labs Reviewed  CBC WITH DIFFERENTIAL - Abnormal; Notable for the  following:    Eosinophils Relative 6 (*)    All other components within normal limits  URINALYSIS, ROUTINE W REFLEX MICROSCOPIC - Abnormal; Notable for the following:    APPearance CLOUDY (*)    Specific Gravity, Urine 1.042 (*)    Hgb urine dipstick MODERATE (*)    Protein, ur 30 (*)    Leukocytes, UA LARGE (*)    All other components within normal limits  URINE CULTURE  BASIC METABOLIC PANEL  URINE MICROSCOPIC-ADD ON   Imaging Review No results found.  MDM   1. Dehydration    Dehydration, cause unclear. He can be managed as an outpatient with increased  free water intake. No good evidence for UTI. Urine culture has been sent. On examination today. He did not have a fever, or elevated white blood cell count. He has a chronic indwelling suprapubic catheter, likely contributing to an abnormal urinalysis. No evidence for sepsis, metabolic instability, or suspected impending vascular collapse. He is stable for discharge.  Nursing Notes Reviewed/ Care Coordinated, and agree without changes. Applicable Imaging Reviewed.  Interpretation of Laboratory Data incorporated into ED treatment   Plan: Home Medications- usual; Home Treatments and Observation- Increase free water; return here if the recommended treatment, does not improve the symptoms; Recommended follow up- PCP check up in 3 days      Flint Melter, MD 10/02/12 0020

## 2012-10-02 MED ORDER — SODIUM CHLORIDE 0.9 % IV BOLUS (SEPSIS)
1000.0000 mL | Freq: Once | INTRAVENOUS | Status: AC
  Administered 2012-10-02: 1000 mL via INTRAVENOUS

## 2012-10-04 LAB — URINE CULTURE: Colony Count: 10000

## 2012-10-28 ENCOUNTER — Emergency Department (HOSPITAL_COMMUNITY): Payer: Medicare HMO

## 2012-10-28 ENCOUNTER — Emergency Department (HOSPITAL_COMMUNITY)
Admission: EM | Admit: 2012-10-28 | Discharge: 2012-10-28 | Disposition: A | Discharge status: Home / Self Care | Payer: Medicare HMO | Attending: Emergency Medicine | Admitting: Emergency Medicine

## 2012-10-28 ENCOUNTER — Encounter (HOSPITAL_COMMUNITY): Payer: Self-pay | Admitting: Emergency Medicine

## 2012-10-28 DIAGNOSIS — Z87442 Personal history of urinary calculi: Secondary | ICD-10-CM | POA: Insufficient documentation

## 2012-10-28 DIAGNOSIS — R509 Fever, unspecified: Secondary | ICD-10-CM | POA: Insufficient documentation

## 2012-10-28 DIAGNOSIS — L89301 Pressure ulcer of unspecified buttock, stage 1: Secondary | ICD-10-CM

## 2012-10-28 DIAGNOSIS — Z8739 Personal history of other diseases of the musculoskeletal system and connective tissue: Secondary | ICD-10-CM | POA: Insufficient documentation

## 2012-10-28 DIAGNOSIS — F3289 Other specified depressive episodes: Secondary | ICD-10-CM | POA: Insufficient documentation

## 2012-10-28 DIAGNOSIS — R000 Tachycardia, unspecified: Secondary | ICD-10-CM | POA: Insufficient documentation

## 2012-10-28 DIAGNOSIS — L89309 Pressure ulcer of unspecified buttock, unspecified stage: Secondary | ICD-10-CM | POA: Insufficient documentation

## 2012-10-28 DIAGNOSIS — Z87440 Personal history of urinary (tract) infections: Secondary | ICD-10-CM | POA: Insufficient documentation

## 2012-10-28 DIAGNOSIS — L89522 Pressure ulcer of left ankle, stage 2: Secondary | ICD-10-CM

## 2012-10-28 DIAGNOSIS — Z862 Personal history of diseases of the blood and blood-forming organs and certain disorders involving the immune mechanism: Secondary | ICD-10-CM | POA: Insufficient documentation

## 2012-10-28 DIAGNOSIS — J45909 Unspecified asthma, uncomplicated: Secondary | ICD-10-CM | POA: Insufficient documentation

## 2012-10-28 DIAGNOSIS — Z79899 Other long term (current) drug therapy: Secondary | ICD-10-CM | POA: Insufficient documentation

## 2012-10-28 DIAGNOSIS — F329 Major depressive disorder, single episode, unspecified: Secondary | ICD-10-CM | POA: Insufficient documentation

## 2012-10-28 DIAGNOSIS — Z8669 Personal history of other diseases of the nervous system and sense organs: Secondary | ICD-10-CM | POA: Insufficient documentation

## 2012-10-28 DIAGNOSIS — Z87891 Personal history of nicotine dependence: Secondary | ICD-10-CM | POA: Insufficient documentation

## 2012-10-28 DIAGNOSIS — L8992 Pressure ulcer of unspecified site, stage 2: Secondary | ICD-10-CM | POA: Insufficient documentation

## 2012-10-28 DIAGNOSIS — L8991 Pressure ulcer of unspecified site, stage 1: Secondary | ICD-10-CM | POA: Insufficient documentation

## 2012-10-28 DIAGNOSIS — R109 Unspecified abdominal pain: Secondary | ICD-10-CM | POA: Insufficient documentation

## 2012-10-28 DIAGNOSIS — Z931 Gastrostomy status: Secondary | ICD-10-CM | POA: Insufficient documentation

## 2012-10-28 DIAGNOSIS — L89509 Pressure ulcer of unspecified ankle, unspecified stage: Secondary | ICD-10-CM | POA: Insufficient documentation

## 2012-10-28 LAB — CBC WITH DIFFERENTIAL/PLATELET
Basophils Absolute: 0 10*3/uL (ref 0.0–0.1)
HCT: 38.8 % — ABNORMAL LOW (ref 39.0–52.0)
Hemoglobin: 13.1 g/dL (ref 13.0–17.0)
Lymphocytes Relative: 11 % — ABNORMAL LOW (ref 12–46)
Lymphs Abs: 1.4 10*3/uL (ref 0.7–4.0)
Monocytes Absolute: 0.6 10*3/uL (ref 0.1–1.0)
Monocytes Relative: 5 % (ref 3–12)
Neutro Abs: 9.5 10*3/uL — ABNORMAL HIGH (ref 1.7–7.7)
RBC: 4.49 MIL/uL (ref 4.22–5.81)
RDW: 13.4 % (ref 11.5–15.5)
WBC: 11.9 10*3/uL — ABNORMAL HIGH (ref 4.0–10.5)

## 2012-10-28 LAB — URINALYSIS, ROUTINE W REFLEX MICROSCOPIC
Bilirubin Urine: NEGATIVE
Protein, ur: NEGATIVE mg/dL
Urobilinogen, UA: 0.2 mg/dL (ref 0.0–1.0)

## 2012-10-28 LAB — COMPREHENSIVE METABOLIC PANEL
AST: 19 U/L (ref 0–37)
CO2: 23 mEq/L (ref 19–32)
Chloride: 99 mEq/L (ref 96–112)
Creatinine, Ser: 0.67 mg/dL (ref 0.50–1.35)
GFR calc non Af Amer: >90 mL/min (ref >90)
Total Bilirubin: 0.2 mg/dL — ABNORMAL LOW (ref 0.3–1.2)

## 2012-10-28 LAB — URINE MICROSCOPIC-ADD ON

## 2012-10-28 LAB — CG4 I-STAT (LACTIC ACID): Lactic Acid, Venous: 2.42 mmol/L — ABNORMAL HIGH (ref 0.5–2.2)

## 2012-10-28 MED ORDER — SODIUM CHLORIDE 0.9 % IV SOLN
INTRAVENOUS | Status: DC
Start: 2012-10-28 — End: 2012-10-29
  Administered 2012-10-28: 15:00:00 via INTRAVENOUS

## 2012-10-28 MED ORDER — SODIUM CHLORIDE 0.9 % IV BOLUS (SEPSIS)
1000.0000 mL | Freq: Once | INTRAVENOUS | Status: AC
  Administered 2012-10-28: 1000 mL via INTRAVENOUS

## 2012-10-28 MED ORDER — IOHEXOL 300 MG/ML  SOLN
25.0000 mL | INTRAMUSCULAR | Status: AC
  Administered 2012-10-28: 25 mL via ORAL

## 2012-10-28 MED ORDER — CEPHALEXIN 250 MG/5ML PO SUSR: 500.0000 mg | Freq: Four times a day (QID) | ORAL | Status: DC

## 2012-10-28 MED ORDER — FENTANYL CITRATE 0.05 MG/ML IJ SOLN
100.0000 ug | Freq: Once | INTRAMUSCULAR | Status: AC
  Administered 2012-10-28: 100 ug via INTRAVENOUS
  Filled 2012-10-28: qty 2

## 2012-10-28 MED ORDER — SODIUM CHLORIDE 0.9 % IV BOLUS (SEPSIS): 500.0000 mL | Freq: Once | INTRAVENOUS | Status: DC

## 2012-10-28 MED ORDER — IOHEXOL 300 MG/ML  SOLN
100.0000 mL | Freq: Once | INTRAMUSCULAR | Status: AC | PRN
  Administered 2012-10-28: 100 mL via INTRAVENOUS

## 2012-10-28 MED ORDER — CLINDAMYCIN HCL 150 MG PO CAPS: 300.0000 mg | ORAL_CAPSULE | Freq: Four times a day (QID) | ORAL | Status: DC

## 2012-10-28 NOTE — ED Notes (Signed)
Per EMS pt from [facility] with c/o moaning and possible fever. Pt began to moan this am, appears to have abdominal pain. Wife states pt not normally like this. BP 108/60, HR 98. Pt seen at urologist this am for f/u from previous urinary tract infection, states cleared on their end, but sent here due to moaning in pain. Hx of MS, quadriplegic, nonverbal.

## 2012-10-28 NOTE — ED Notes (Signed)
Pt had large BM, loose, but brown, cleaned and chux placed under pt.

## 2012-10-28 NOTE — ED Notes (Signed)
Lactic acid result shown to  Geiple,PA.

## 2012-10-28 NOTE — ED Notes (Signed)
Report called to [facility], [facility] called to transport pt back.

## 2012-10-28 NOTE — ED Notes (Signed)
Contrast given via peg tube.

## 2012-10-28 NOTE — ED Notes (Signed)
Wife states pt found to be moaning this am, states he appeared he was in pain. No fever noted, pt not warm to touch. Has decub to left foot wife is concerned about, also has skin breakdown to bottom. Pt seen at urology this am, states cleared from previous UTI he was being treated for.

## 2012-10-28 NOTE — ED Provider Notes (Signed)
CSN: [id number]     Arrival date & time 10/28/12  1256 History   First MD Initiated Contact with Patient 10/28/12 1257     Chief Complaint  Patient presents with  . Fever  . Abdominal Pain   (Consider location/radiation/quality/duration/timing/severity/associated sxs/prior Treatment) HPI Comments: Patients with history of quadriplegia, encephalopathy/non-verbal, patent tube, indwelling suprapubic catheter -- presents with discomfort. Patient was seen a urologist office this morning and cleared from a previous UTI. Nursing facility felt patient had a fever. He was moaning which is unusual. Per wife, acting as if he is having abdominal cramps. No fever upon arrival to ED. H/o L heel decubitus ulcer x 1 year, worsening over past month. Also minor sacral decubitus ulcer. Level V caveat as patient non-verbal.   Patient is a 37 y.o. male presenting with fever and abdominal pain. The history is provided by medical records and the spouse.  Fever Abdominal Pain Associated symptoms: fever     Past Medical History  Diagnosis Date  . MS (multiple sclerosis)   . 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 Childhood asthma   . Depression   . Neuromuscular disorder     Quadraperesis  . Recurrent UTI   . Dysphagia   . Bladder calculi   . Neurogenic bladder   . Shortness of breath   . Recurrent upper respiratory infection (URI)   . Normocytic anemia 05/28/2011   Past Surgical History  Procedure Laterality Date  . Lumbar puncture  10/12/2002  . Gastrostomy  04/16/2011    Procedure: GASTROSTOMY;  Surgeon: Liz Malady, MD;  Location: [facility] OR;  Service: General;  Laterality: N/A;  Open G-Tube placement   Family History  Problem Relation Age of Onset  . Asthma Mother    History  Substance Use Topics  . Smoking status: Former Smoker    Types: Cigarettes    Quit date: 02/02/1999  . Smokeless tobacco: Former Neurosurgeon  . Alcohol Use: No    Review of Systems  Unable to perform ROS: Patient  nonverbal  Constitutional: Positive for fever.  Gastrointestinal: Positive for abdominal pain.    Allergies  Review of patient's allergies indicates no known allergies.  Home Medications   Current Outpatient Rx  Name  Route  Sig  Dispense  Refill  . artificial tears (LACRILUBE) OINT ophthalmic ointment   Ophthalmic   Apply 1 application to eye every 8 (eight) hours. For dry eyes.   1 Tube   0   . ascorbic acid (VITAMIN C) 500 MG/5ML syrup   PEG Tube   500 mg by PEG Tube route 2 (two) times daily.         . baclofen (LIORESAL) 10 MG tablet   PEG Tube   10 mg by PEG Tube route 4 (four) times daily as needed (as needed for muscle spasms).         Marland Kitchen buPROPion (WELLBUTRIN) 100 MG tablet   PEG Tube   100 mg by PEG Tube route 3 (three) times daily.         . cholecalciferol (VITAMIN D) 1000 UNITS tablet   PEG Tube   1,000 Units by PEG Tube route 2 (two) times daily.          . Cranberry 475 MG CAPS   PEG Tube   1 capsule by PEG Tube route 2 (two) times daily.         Marland Kitchen docusate (COLACE) 50 MG/5ML liquid   PEG Tube   100 mg by PEG  Tube route daily.         . Fingolimod HCl (GILENYA) 0.5 MG CAPS   PEG Tube   1 capsule by PEG Tube route daily.         Marland Kitchen ipratropium-albuterol (DUONEB) 0.5-2.5 (3) MG/3ML SOLN   Nebulization   Take 3 mLs by nebulization every 4 (four) hours as needed (as needed for cough and congestion).         Marland Kitchen levETIRAcetam (KEPPRA) 100 MG/ML solution   Per Tube   Place 7.5 mLs (750 mg total) into feeding tube 2 (two) times daily.   473 mL   0   . loratadine (CLARITIN) 10 MG tablet   PEG Tube   10 mg by PEG Tube route daily.         . Multiple Vitamin (MULTIVITAMIN WITH MINERALS) TABS   PEG Tube   1 tablet by PEG Tube route daily.         . Nutritional Supplements (FEEDING SUPPLEMENT, OSMOLITE 1.5 CAL,) LIQD   Per Tube   Place into feeding tube continuous. 38ml/HR         . nystatin (MYCOSTATIN/NYSTOP) 100000 UNIT/GM  POWD   Topical   Apply 1 g topically daily. Apply to groin and buttocks topically every shift for yeast         . ODORLESS GARLIC PO   PEG Tube   1,000 mg by PEG Tube route daily. 500 mg caps         . polyethylene glycol (MIRALAX / GLYCOLAX) packet   PEG Tube   17 g by PEG Tube route every other day.          . sodium chloride (OCEAN) 0.65 % nasal spray   Nasal   Place 1 spray into the nose 2 (two) times daily as needed for congestion.         . sulfamethoxazole-trimethoprim (BACTRIM DS) 800-160 MG per tablet   Oral   Take 1 tablet by mouth 2 (two) times daily as needed (as needed for disruption of wound).          BP 116/76  Temp(Src) 97.7 F (36.5 C) (Rectal)  Resp 22  SpO2 96% Physical Exam  Nursing note and vitals reviewed. Constitutional: He appears well-developed and well-nourished.  HENT:  Head: Normocephalic and atraumatic.  Mouth/Throat: Oropharynx is clear and moist.  Eyes: Conjunctivae are normal. Pupils are equal, round, and reactive to light. Right eye exhibits no discharge. Left eye exhibits no discharge.  Neck: Normal range of motion. Neck supple.  Cardiovascular: Regular rhythm and normal heart sounds.   Mild tachycardia  Pulmonary/Chest: Effort normal and breath sounds normal. No respiratory distress. He has no wheezes. He has no rales.  Abdominal: Soft. There is no guarding.  PEG in place  Genitourinary:  Suprapubic cath in place  Neurological: He is alert.  Skin: Skin is warm and dry.  L heel decub ulcer, stage 2-3, mild associated purulent drainage, no cellulitis. Sacral decub stage 1.   Psychiatric: He has a normal mood and affect.    ED Course  Procedures (including critical care time) Labs Review Labs Reviewed  CBC WITH DIFFERENTIAL - Abnormal; Notable for the following:    WBC 11.9 (*)    HCT 38.8 (*)    Platelets 428 (*)    Neutrophils Relative % 80 (*)    Neutro Abs 9.5 (*)    Lymphocytes Relative 11 (*)    All other  components within normal limits  COMPREHENSIVE METABOLIC PANEL - Abnormal; Notable for the following:    Total Protein 8.5 (*)    Alkaline Phosphatase 140 (*)    Total Bilirubin 0.2 (*)    All other components within normal limits  URINALYSIS, ROUTINE W REFLEX MICROSCOPIC - Abnormal; Notable for the following:    APPearance HAZY (*)    Hgb urine dipstick SMALL (*)    Leukocytes, UA SMALL (*)    All other components within normal limits  URINE MICROSCOPIC-ADD ON - Abnormal; Notable for the following:    Bacteria, UA FEW (*)    Crystals CA OXALATE CRYSTALS (*)    All other components within normal limits  CG4 I-STAT (LACTIC ACID) - Abnormal; Notable for the following:    Lactic Acid, Venous 2.42 (*)    All other components within normal limits  URINE CULTURE   Imaging Review Dg Chest 1 View  10/28/2012   CLINICAL DATA:  Fever  EXAM: CHEST - 1 VIEW  COMPARISON:  05/13/2012  FINDINGS: The cardiac shadow is stable. The lungs are clear bilaterally. Mild gaseous distension of the colon is seen. No bony abnormality is noted.  IMPRESSION: No acute abnormality seen.   Electronically Signed   By: Alcide Clever M.D.   On: 10/28/2012 14:41   Ct Abdomen Pelvis W Contrast  10/28/2012   CLINICAL DATA:  Moaning and possible fever. Patient appears to have abdominal pain. History of quadriparesis.  EXAM: CT ABDOMEN AND PELVIS WITH CONTRAST  TECHNIQUE: Multidetector CT imaging of the abdomen and pelvis was performed using the standard protocol following bolus administration of intravenous contrast.  CONTRAST:  OMNIPAQUE IOHEXOL 300 MG/ML  SOLN  COMPARISON:  01/29/2012  FINDINGS: Limited evaluation of the lung bases due to motion artifact. There is a poorly characterized nodular structure in the right middle lobe on sequence 4, image 8. Patient did have a pulmonary nodule on the exam from 2013 and unclear if this has changed in size.  No evidence for free air. Patient has a gastrostomy tube that appears  to be appropriately positioned in the stomach. No gross abnormality to the liver, gallbladder, portal venous system, spleen, pancreas or adrenal glands. No gross abnormality to the kidneys. The urinary bladder is decompressed with a suprapubic catheter. There is a gas and fluid throughout the colon. There is some gas within the appendix.  Posterior dislocation of the right hip and posterior subluxation of the left hip.  IMPRESSION: No acute abnormalities within the abdomen or pelvis.  Probable nodule in the right middle lobe which is poorly characterized due to excessive motion artifact.  Right hip dislocation and left hip subluxation.   Electronically Signed   By: Richarda Overlie M.D.   On: 10/28/2012 17:57   Dg Foot 2 Views Left  10/28/2012   CLINICAL DATA:  Left heel ulcer.  EXAM: LEFT FOOT - 2 VIEW  COMPARISON:  None.  FINDINGS: Small ulcer/ soft tissue defect noted within the heel soft tissues. No underlying acute bony abnormality. No evidence of osteomyelitis.  Soft tissue swelling along the dorsum of the foot overlying the metatarsals. No underlying acute bony abnormality. No fracture, subluxation or dislocation.  IMPRESSION: No acute bony abnormality.   Electronically Signed   By: Charlett Nose M.D.   On: 10/28/2012 14:48    EKG Interpretation   None      1:50 PM Patient seen and examined. D/w Dr. Jeraldine Loots. Work-up initiated. Medications ordered.   Vital signs reviewed and are as follows:  Filed Vitals:   10/28/12 1322  BP: 116/76  Temp: 97.7 F (36.5 C)  Resp: 22   Patient remained stable during ED stay. Pt continues to moan and without obvious source, CT ordered to ensure no abdominal catastrophe.   CT reviewed by myself. Patient's father informed of results. He is concerned about leaking PEG tube as well as decubitus ulcers. Reassured the PEG tube is functional and located in its appropriate place. Decubitus ulcer needs to be followed closely by PCP.  Father to ensure close followup,  possible wound care followup.  Will start on Keflex for possible infection.  Father urged to return with pt with worsening symptoms or other concerns. Parent verbalizes understanding and agrees with plan.     MDM   1. Abdominal pain   2. Decubitus ulcer of ankle, left, stage II   3. Decubitus ulcer of buttock, unspecified laterality, stage I    Patient with reported fever, possible abdominal pain. Patient has not been febrile in emergency department. As he is unable to verbalize symptoms, workup initiated showing mild lactic acidosis treated with fluids, normal chest x-ray, normal CT scan. 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 Suspect mild infection of heel ulcer, will start on Keflex. Otherwise patient appears at baseline. No emergent medical conditions identified. No medical conditions which would benefit from inpatient hospitalization identified. Will discharge home to nursing care, close PCP followup.   Renne Crigler, PA-C 10/28/12 1953

## 2012-10-28 NOTE — ED Notes (Signed)
Pt began to moan, as if in pain, given pain medication.

## 2012-10-28 NOTE — ED Notes (Signed)
Pt in CT at this time.

## 2012-10-29 ENCOUNTER — Telehealth (HOSPITAL_COMMUNITY): Payer: Self-pay | Admitting: *Deleted

## 2012-10-29 LAB — URINE CULTURE
Colony Count: NO GROWTH
Culture: NO GROWTH

## 2012-10-29 NOTE — ED Provider Notes (Signed)
  This was a shared visit with a mid-level provided (NP or PA).  Throughout the patient's course I was available for consultation/collaboration.  I saw the ECG (if appropriate), relevant labs and studies - I agree with the interpretation.  On my exam the patient was in no distress.  However, given his significant comorbidities, a broad evaluation was conducted essentially to rule out early infection.  Patient's labs were largely reassuring, and he had no decomposition here in the emergency department. Patient's evaluation is most notable for demonstration of cutaneous infection, with no evidence of osteomyelitis.  Patient has home health care, in a substantial amount, and thus, seems appropriate for outpatient management.     Gerhard Munch, MD 10/29/12 1217

## 2012-12-12 ENCOUNTER — Inpatient Hospital Stay (HOSPITAL_COMMUNITY): Payer: Medicare HMO

## 2012-12-12 ENCOUNTER — Encounter (HOSPITAL_COMMUNITY): Payer: Self-pay | Admitting: Emergency Medicine

## 2012-12-12 ENCOUNTER — Emergency Department (HOSPITAL_COMMUNITY): Payer: Medicare HMO

## 2012-12-12 ENCOUNTER — Inpatient Hospital Stay (HOSPITAL_COMMUNITY)
Admission: EM | Admit: 2012-12-12 | Discharge: 2012-12-15 | DRG: 698 | Disposition: A | Discharge status: Skilled Nursing Facility | Payer: Medicare HMO | Attending: Internal Medicine | Admitting: Internal Medicine

## 2012-12-12 ENCOUNTER — Other Ambulatory Visit: Payer: Self-pay

## 2012-12-12 DIAGNOSIS — Y846 Urinary catheterization as the cause of abnormal reaction of the patient, or of later complication, without mention of misadventure at the time of the procedure: Secondary | ICD-10-CM | POA: Diagnosis present

## 2012-12-12 DIAGNOSIS — Z825 Family history of asthma and other chronic lower respiratory diseases: Secondary | ICD-10-CM

## 2012-12-12 DIAGNOSIS — G40909 Epilepsy, unspecified, not intractable, without status epilepticus: Secondary | ICD-10-CM | POA: Diagnosis present

## 2012-12-12 DIAGNOSIS — Z87891 Personal history of nicotine dependence: Secondary | ICD-10-CM

## 2012-12-12 DIAGNOSIS — L8992 Pressure ulcer of unspecified site, stage 2: Secondary | ICD-10-CM | POA: Diagnosis present

## 2012-12-12 DIAGNOSIS — L89109 Pressure ulcer of unspecified part of back, unspecified stage: Secondary | ICD-10-CM | POA: Diagnosis present

## 2012-12-12 DIAGNOSIS — A419 Sepsis, unspecified organism: Secondary | ICD-10-CM | POA: Diagnosis present

## 2012-12-12 DIAGNOSIS — Z87442 Personal history of urinary calculi: Secondary | ICD-10-CM

## 2012-12-12 DIAGNOSIS — G8250 Quadriplegia, unspecified: Secondary | ICD-10-CM

## 2012-12-12 DIAGNOSIS — R531 Weakness: Secondary | ICD-10-CM

## 2012-12-12 DIAGNOSIS — T83511A Infection and inflammatory reaction due to indwelling urethral catheter, initial encounter: Principal | ICD-10-CM | POA: Diagnosis present

## 2012-12-12 DIAGNOSIS — F3289 Other specified depressive episodes: Secondary | ICD-10-CM | POA: Diagnosis present

## 2012-12-12 DIAGNOSIS — E876 Hypokalemia: Secondary | ICD-10-CM

## 2012-12-12 DIAGNOSIS — N210 Calculus in bladder: Secondary | ICD-10-CM

## 2012-12-12 DIAGNOSIS — R1110 Vomiting, unspecified: Secondary | ICD-10-CM | POA: Diagnosis present

## 2012-12-12 DIAGNOSIS — Z87440 Personal history of urinary (tract) infections: Secondary | ICD-10-CM

## 2012-12-12 DIAGNOSIS — G35 Multiple sclerosis: Secondary | ICD-10-CM

## 2012-12-12 DIAGNOSIS — D649 Anemia, unspecified: Secondary | ICD-10-CM | POA: Diagnosis present

## 2012-12-12 DIAGNOSIS — L89152 Pressure ulcer of sacral region, stage 2: Secondary | ICD-10-CM

## 2012-12-12 DIAGNOSIS — G9340 Encephalopathy, unspecified: Secondary | ICD-10-CM | POA: Diagnosis present

## 2012-12-12 DIAGNOSIS — Z931 Gastrostomy status: Secondary | ICD-10-CM

## 2012-12-12 DIAGNOSIS — R627 Adult failure to thrive: Secondary | ICD-10-CM | POA: Diagnosis present

## 2012-12-12 DIAGNOSIS — B952 Enterococcus as the cause of diseases classified elsewhere: Secondary | ICD-10-CM

## 2012-12-12 DIAGNOSIS — E43 Unspecified severe protein-calorie malnutrition: Secondary | ICD-10-CM

## 2012-12-12 DIAGNOSIS — G92 Toxic encephalopathy: Secondary | ICD-10-CM

## 2012-12-12 DIAGNOSIS — E46 Unspecified protein-calorie malnutrition: Secondary | ICD-10-CM

## 2012-12-12 DIAGNOSIS — F329 Major depressive disorder, single episode, unspecified: Secondary | ICD-10-CM | POA: Diagnosis present

## 2012-12-12 DIAGNOSIS — R633 Feeding difficulties: Secondary | ICD-10-CM

## 2012-12-12 DIAGNOSIS — N390 Urinary tract infection, site not specified: Secondary | ICD-10-CM | POA: Diagnosis present

## 2012-12-12 DIAGNOSIS — G35D Multiple sclerosis, unspecified: Secondary | ICD-10-CM | POA: Diagnosis present

## 2012-12-12 DIAGNOSIS — N319 Neuromuscular dysfunction of bladder, unspecified: Secondary | ICD-10-CM | POA: Diagnosis present

## 2012-12-12 DIAGNOSIS — Z79899 Other long term (current) drug therapy: Secondary | ICD-10-CM

## 2012-12-12 LAB — POCT I-STAT 3, ART BLOOD GAS (G3+)
Bicarbonate: 21 mEq/L (ref 20.0–24.0)
Patient temperature: 98.6
TCO2: 22 mmol/L (ref 0–100)
pCO2 arterial: 30.8 mmHg — ABNORMAL LOW (ref 35.0–45.0)
pH, Arterial: 7.441 (ref 7.350–7.450)
pO2, Arterial: 119 mmHg — ABNORMAL HIGH (ref 80.0–100.0)

## 2012-12-12 LAB — CBC WITH DIFFERENTIAL/PLATELET
Basophils Absolute: 0 10*3/uL (ref 0.0–0.1)
Basophils Relative: 0 % (ref 0–1)
Eosinophils Relative: 0 % (ref 0–5)
HCT: 40.8 % (ref 39.0–52.0)
Hemoglobin: 13.7 g/dL (ref 13.0–17.0)
Lymphocytes Relative: 6 % — ABNORMAL LOW (ref 12–46)
MCHC: 33.6 g/dL (ref 30.0–36.0)
MCV: 86.6 fL (ref 78.0–100.0)
Monocytes Absolute: 1.5 10*3/uL — ABNORMAL HIGH (ref 0.1–1.0)
Monocytes Relative: 7 % (ref 3–12)
RBC: 4.71 MIL/uL (ref 4.22–5.81)
RDW: 14.7 % (ref 11.5–15.5)
WBC: 20.9 10*3/uL — ABNORMAL HIGH (ref 4.0–10.5)

## 2012-12-12 LAB — URINALYSIS, ROUTINE W REFLEX MICROSCOPIC
Bilirubin Urine: NEGATIVE
Bilirubin Urine: NEGATIVE
Nitrite: NEGATIVE
Protein, ur: 100 mg/dL — AB
Specific Gravity, Urine: 1.016 (ref 1.005–1.030)
Specific Gravity, Urine: 1.014 (ref 1.005–1.030)
Urobilinogen, UA: 0.2 mg/dL (ref 0.0–1.0)
Urobilinogen, UA: 0.2 mg/dL (ref 0.0–1.0)
pH: 8 (ref 5.0–8.0)

## 2012-12-12 LAB — URINE MICROSCOPIC-ADD ON

## 2012-12-12 LAB — PROCALCITONIN: Procalcitonin: <0.1 ng/mL

## 2012-12-12 LAB — COMPREHENSIVE METABOLIC PANEL
ALT: 20 U/L (ref 0–53)
AST: 22 U/L (ref 0–37)
Albumin: 3.5 g/dL (ref 3.5–5.2)
BUN: 25 mg/dL — ABNORMAL HIGH (ref 6–23)
CO2: 22 mEq/L (ref 19–32)
Calcium: 8.9 mg/dL (ref 8.4–10.5)
Creatinine, Ser: 0.89 mg/dL (ref 0.50–1.35)
GFR calc non Af Amer: >90 mL/min (ref >90)
Total Bilirubin: 0.3 mg/dL (ref 0.3–1.2)

## 2012-12-12 LAB — CG4 I-STAT (LACTIC ACID): Lactic Acid, Venous: 1.88 mmol/L (ref 0.5–2.2)

## 2012-12-12 LAB — POCT I-STAT, CHEM 8
Calcium, Ion: 1.13 mmol/L (ref 1.12–1.23)
Chloride: 106 mEq/L (ref 96–112)
Creatinine, Ser: 1.2 mg/dL (ref 0.50–1.35)
Glucose, Bld: 105 mg/dL — ABNORMAL HIGH (ref 70–99)
Hemoglobin: 15 g/dL (ref 13.0–17.0)
Potassium: 4.1 mEq/L (ref 3.5–5.1)
Sodium: 140 mEq/L (ref 135–145)
TCO2: 22 mmol/L (ref 0–100)

## 2012-12-12 LAB — GLUCOSE, CAPILLARY: Glucose-Capillary: 87 mg/dL (ref 70–99)

## 2012-12-12 LAB — LIPASE, BLOOD: Lipase: 15 U/L (ref 11–59)

## 2012-12-12 MED ORDER — COLLAGENASE 250 UNIT/GM EX OINT
TOPICAL_OINTMENT | Freq: Every day | CUTANEOUS | Status: DC
  Administered 2012-12-12 – 2012-12-15 (×4): via TOPICAL
  Filled 2012-12-12: qty 30

## 2012-12-12 MED ORDER — ACETAMINOPHEN 650 MG RE SUPP
650.0000 mg | Freq: Once | RECTAL | Status: AC
  Administered 2012-12-12: 650 mg via RECTAL
  Filled 2012-12-12: qty 1

## 2012-12-12 MED ORDER — SODIUM CHLORIDE 0.9 % IV SOLN
1000.0000 mL | INTRAVENOUS | Status: DC
  Administered 2012-12-12: 1000 mL via INTRAVENOUS

## 2012-12-12 MED ORDER — ONDANSETRON HCL 4 MG PO TABS: 4.0000 mg | ORAL_TABLET | Freq: Four times a day (QID) | ORAL | Status: DC | PRN

## 2012-12-12 MED ORDER — ONDANSETRON HCL 4 MG/2ML IJ SOLN: 4.0000 mg | Freq: Four times a day (QID) | INTRAMUSCULAR | Status: DC | PRN

## 2012-12-12 MED ORDER — VANCOMYCIN HCL IN DEXTROSE 1-5 GM/200ML-% IV SOLN
1000.0000 mg | Freq: Three times a day (TID) | INTRAVENOUS | Status: DC
  Administered 2012-12-12 – 2012-12-14 (×7): 1000 mg via INTRAVENOUS
  Filled 2012-12-12 (×9): qty 200

## 2012-12-12 MED ORDER — SODIUM CHLORIDE 0.9 % IV SOLN
1000.0000 mL | Freq: Once | INTRAVENOUS | Status: AC
  Administered 2012-12-12: 1000 mL via INTRAVENOUS

## 2012-12-12 MED ORDER — ACETAMINOPHEN 325 MG PO TABS
650.0000 mg | ORAL_TABLET | Freq: Four times a day (QID) | ORAL | Status: DC | PRN
  Administered 2012-12-13: 650 mg via ORAL
  Filled 2012-12-12: qty 2

## 2012-12-12 MED ORDER — SODIUM CHLORIDE 0.9 % IV SOLN
750.0000 mg | Freq: Two times a day (BID) | INTRAVENOUS | Status: DC
  Filled 2012-12-12 (×2): qty 7.5

## 2012-12-12 MED ORDER — BUPROPION HCL 75 MG PO TABS
75.0000 mg | ORAL_TABLET | Freq: Every day | ORAL | Status: DC
  Administered 2012-12-12 – 2012-12-15 (×4): 75 mg via ORAL
  Filled 2012-12-12 (×4): qty 1

## 2012-12-12 MED ORDER — OSMOLITE 1.5 CAL PO LIQD
1000.0000 mL | ORAL | Status: DC
  Administered 2012-12-12 – 2012-12-15 (×4): 1000 mL
  Filled 2012-12-12 (×9): qty 1000

## 2012-12-12 MED ORDER — BACLOFEN 20 MG PO TABS
20.0000 mg | ORAL_TABLET | Freq: Three times a day (TID) | ORAL | Status: DC
  Administered 2012-12-12 – 2012-12-15 (×9): 20 mg
  Filled 2012-12-12 (×11): qty 1

## 2012-12-12 MED ORDER — LEVETIRACETAM 100 MG/ML PO SOLN
750.0000 mg | Freq: Two times a day (BID) | ORAL | Status: DC
  Administered 2012-12-12 – 2012-12-15 (×7): 750 mg
  Filled 2012-12-12 (×9): qty 7.5

## 2012-12-12 MED ORDER — SODIUM CHLORIDE 0.9 % IJ SOLN
3.0000 mL | Freq: Two times a day (BID) | INTRAMUSCULAR | Status: DC
  Administered 2012-12-12 – 2012-12-14 (×4): 3 mL via INTRAVENOUS

## 2012-12-12 MED ORDER — KCL IN DEXTROSE-NACL 20-5-0.9 MEQ/L-%-% IV SOLN
INTRAVENOUS | Status: DC
  Administered 2012-12-12: 12:00:00 via INTRAVENOUS
  Filled 2012-12-12 (×6): qty 1000

## 2012-12-12 MED ORDER — PANTOPRAZOLE SODIUM 40 MG IV SOLR
40.0000 mg | Freq: Every day | INTRAVENOUS | Status: DC
  Administered 2012-12-12 – 2012-12-15 (×4): 40 mg via INTRAVENOUS
  Filled 2012-12-12 (×4): qty 40

## 2012-12-12 MED ORDER — PRO-STAT SUGAR FREE PO LIQD
30.0000 mL | Freq: Three times a day (TID) | ORAL | Status: DC
  Administered 2012-12-12 – 2012-12-15 (×9): 30 mL
  Filled 2012-12-12 (×11): qty 30

## 2012-12-12 MED ORDER — PIPERACILLIN-TAZOBACTAM 3.375 G IVPB
3.3750 g | Freq: Three times a day (TID) | INTRAVENOUS | Status: DC
  Administered 2012-12-12 – 2012-12-14 (×7): 3.375 g via INTRAVENOUS
  Filled 2012-12-12 (×9): qty 50

## 2012-12-12 MED ORDER — ACETAMINOPHEN 650 MG RE SUPP: 650.0000 mg | Freq: Four times a day (QID) | RECTAL | Status: DC | PRN

## 2012-12-12 MED ORDER — HEPARIN SODIUM (PORCINE) 5000 UNIT/ML IJ SOLN
5000.0000 [IU] | Freq: Three times a day (TID) | INTRAMUSCULAR | Status: DC
  Administered 2012-12-12 – 2012-12-15 (×9): 5000 [IU] via SUBCUTANEOUS
  Filled 2012-12-12 (×12): qty 1

## 2012-12-12 MED ORDER — SODIUM CHLORIDE 0.9 % IV SOLN: INTRAVENOUS | Status: DC

## 2012-12-12 MED ORDER — BACLOFEN 1 MG/ML ORAL SUSPENSION
20.0000 mg | Freq: Three times a day (TID) | ORAL | Status: DC
  Filled 2012-12-12 (×3): qty 2

## 2012-12-12 MED ORDER — POLYETHYLENE GLYCOL 3350 17 G PO PACK
17.0000 g | PACK | Freq: Every day | ORAL | Status: DC
  Administered 2012-12-13 – 2012-12-15 (×3): 17 g via ORAL
  Filled 2012-12-12 (×4): qty 1

## 2012-12-12 MED ORDER — VANCOMYCIN HCL IN DEXTROSE 1-5 GM/200ML-% IV SOLN
1000.0000 mg | Freq: Once | INTRAVENOUS | Status: AC
  Administered 2012-12-12: 1000 mg via INTRAVENOUS
  Filled 2012-12-12: qty 200

## 2012-12-12 MED ORDER — SODIUM CHLORIDE 0.9 % IV BOLUS (SEPSIS)
500.0000 mL | Freq: Once | INTRAVENOUS | Status: AC
  Administered 2012-12-12: 500 mL via INTRAVENOUS

## 2012-12-12 MED ORDER — ALBUTEROL SULFATE (5 MG/ML) 0.5% IN NEBU: 2.5000 mg | INHALATION_SOLUTION | Freq: Four times a day (QID) | RESPIRATORY_TRACT | Status: DC | PRN

## 2012-12-12 MED ORDER — DEXTROSE 5 % IV SOLN
2.0000 g | Freq: Two times a day (BID) | INTRAVENOUS | Status: DC
  Administered 2012-12-12: 2 g via INTRAVENOUS
  Filled 2012-12-12: qty 2

## 2012-12-12 NOTE — Progress Notes (Signed)
ANTIBIOTIC CONSULT NOTE - INITIAL  Pharmacy Consult for Vancocin and Zosyn Indication: rule out pneumonia and UTI  No Known Allergies  Patient Measurements: Weight: ~85kg  Vital Signs: Temp: 100.9 F (38.3 C) (12/05 0410) Temp src: Rectal (12/05 0410) BP: 132/81 mmHg (12/05 0630) Pulse Rate: 123 (12/05 0630)  Labs:  Recent Labs  12/12/12 0251 12/12/12 0255  WBC  --  20.9*  HGB 15.0 13.7  PLT  --  362  CREATININE 1.20 0.89    Microbiology: No results found for this or any previous visit (from the past 720 hour(s)).  Medical History: Past Medical History  Diagnosis Date  . MS (multiple sclerosis)   . 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 Childhood asthma   . Depression   . Neuromuscular disorder     Quadraperesis  . Recurrent UTI   . Dysphagia   . Bladder calculi   . Neurogenic bladder   . Shortness of breath   . Recurrent upper respiratory infection (URI)   . Normocytic anemia 05/28/2011    Assessment: 37yo male was found by SNF staff to have ALOC and temp to 104, no improvement w/ APAP, concern for urosepsis vs PNA, to begin IV ABX.  Goal of Therapy:  Vancomycin trough level 15-20 mcg/ml  Plan:  Rec'd vanc 1g and cefepime 2g in ED; will continue with vancomycin 1g IV Q8H and Zosyn 3.375g IV Q8H and monitor CBC, Cx, levels prn.  Vernard Gambles, PharmD, BCPS  12/12/2012,7:00 AM

## 2012-12-12 NOTE — ED Notes (Addendum)
Per EMS: Pt was noted to have altered LOC by night staff at facility and found to be febrile 104.  gave 1 dose of tylenol with no improvement.  90% on RA, patient on NRB now.  Patient more lethargic than usual per staff.  Patient has MS

## 2012-12-12 NOTE — Progress Notes (Signed)
[facility] PROGRESS NOTE  Terry Palmer XBJ:[id number] DOB: Oct 02, 1975 DOA: 12/12/2012 PCP: Eloisa Northern, MD  Assessment/Plan: 37 y.o. male with Past medical history of advanced multiple sclerosis, quadriparesis, neurogenic bladder, chronic indwelling Foley catheter, status post PEG tube implant, decubitus ulcer stage II, recurrent UTI, pneumonia presented from SNF with fever, chills admitted with sepsis   1. Sepsis/UTI/leukocytosis/fever/SIRS -cont IV atx; f/u cultures; IVF;   2. Advanced MS, quadriparesis; spasticity, progressive encephalopathy, s/p PEG; -cont supportive care, restart baclofen, restart PEG; KUB no acute findings;   3. Decub ulcers; cont wound care;   Code Status: full Family Communication: father at the bedside (indicate person spoken with, relationship, and if by phone, the number) Disposition Plan: snf when ready   Consultants:  None   Procedures:  CXR  Antibiotics:  vanc 12/5<<<  Zosyn 12/5<<<< (indicate start date, and stop date if known)  HPI/Subjective: Non verbal  Objective: Filed Vitals:   12/12/12 0900  BP: 123/74  Pulse: 99  Temp:   Resp: 18    Intake/Output Summary (Last 24 hours) at 12/12/12 0947 Last data filed at 12/12/12 0900  Gross per 24 hour  Intake    250 ml  Output      0 ml  Net    250 ml   Filed Weights   12/12/12 0800  Weight: 89.9 kg (198 lb 3.1 oz)    Exam:   General:  Non verbal  Cardiovascular: s1,s2 rrr  Respiratory: few LL crackles  Abdomen: soft, + PEG  Musculoskeletal: contracted all extremities    Data Reviewed: Basic Metabolic Panel:  Recent Labs Lab 12/12/12 0251 12/12/12 0255  NA 140 135  K 4.1 4.0  CL 106 99  CO2  --  22  GLUCOSE 105* 104*  BUN 25* 25*  CREATININE 1.20 0.89  CALCIUM  --  8.9   Liver Function Tests:  Recent Labs Lab 12/12/12 0255  AST 22  ALT 20  ALKPHOS 136*  BILITOT 0.3  PROT 8.1  ALBUMIN 3.5    Recent Labs Lab 12/12/12 0255  LIPASE  15   No results found for this basename: AMMONIA,  in the last 168 hours CBC:  Recent Labs Lab 12/12/12 0251 12/12/12 0255  WBC  --  20.9*  NEUTROABS  --  18.0*  HGB 15.0 13.7  HCT 44.0 40.8  MCV  --  86.6  PLT  --  362   Cardiac Enzymes: No results found for this basename: CKTOTAL, CKMB, CKMBINDEX, TROPONINI,  in the last 168 hours BNP (last 3 results) No results found for this basename: PROBNP,  in the last 8760 hours CBG: No results found for this basename: GLUCAP,  in the last 168 hours  No results found for this or any previous visit (from the past 240 hour(s)).   Studies: Dg Chest Port 1 View  12/12/2012   CLINICAL DATA:  Code sepsis  EXAM: PORTABLE CHEST - 1 VIEW  COMPARISON:  None.  FINDINGS: Limited by positioning and hypoaeration. Hemidiaphragm elevation partially obscures the lung bases. Mild left lung base opacity, favor atelectasis. Cardiomediastinal contours within normal range. No definite pleural effusion or pneumothorax. No acute osseous finding.  IMPRESSION: Hypoaeration and mild left lung base opacity, favor atelectasis.   Electronically Signed   By: Jearld Lesch M.D.   On: 12/12/2012 03:18   Dg Abd Portable 1v  12/12/2012   CLINICAL DATA:  History of emesis  EXAM: PORTABLE ABDOMEN - 1 VIEW  COMPARISON:  Correlated with prior  CT 10/28/2012  FINDINGS: The bowel gas pattern is normal. No radio-opaque calculi or other significant radiographic abnormality are seen. Gastric feeding tube is partially visualized. There is levoscoliosis of thoracolumbar spine likely positional.  IMPRESSION: Negative.   Electronically Signed   By: Salome Holmes M.D.   On: 12/12/2012 08:32    Scheduled Meds: . levETIRAcetam  750 mg Intravenous Q12H  . piperacillin-tazobactam (ZOSYN)  IV  3.375 g Intravenous Q8H  . vancomycin  1,000 mg Intravenous Q8H   Continuous Infusions: . sodium chloride 1,000 mL (12/12/12 0900)    Principal Problem:   Sepsis Active Problems:   Multiple  sclerosis   FTT (failure to thrive) in adult   Sacral decubitus ulcer, stage II   Vomiting    Time spent: >35 minutes     Esperanza Sheets  [facility] Pager [phone]. If 7PM-7AM, please contact night-coverage at [email or web address], password [facility] 12/12/2012, 9:47 AM  LOS: 0 days

## 2012-12-12 NOTE — ED Notes (Signed)
Dr. Glick at bedside.  

## 2012-12-12 NOTE — Progress Notes (Signed)
UR completed.  Eldean Nanna, RN BSN [facility] CCM Trauma/Neuro ICU Case Manager [phone]  

## 2012-12-12 NOTE — ED Notes (Signed)
Respiratory paged

## 2012-12-12 NOTE — ED Notes (Signed)
Dr. Glick at the bedside.  

## 2012-12-12 NOTE — ED Notes (Signed)
Pt is experiencing facial twitching, admitting physician aware.

## 2012-12-12 NOTE — Progress Notes (Signed)
EEG completed; results pending.    

## 2012-12-12 NOTE — Progress Notes (Signed)
INITIAL NUTRITION ASSESSMENT  DOCUMENTATION CODES Per approved criteria  -Not Applicable   INTERVENTION:  Initiate TF via PEG with Osmolite 1.5 at 25 ml/h, increase by 10 ml every 4 hours to goal rate of 55 ml/h with Prostat 30 ml TID to provide 2280 kcals, 128 gm protein, 1006 ml free water daily.  NUTRITION DIAGNOSIS: Inadequate oral intake related to inability to eat with chronic dysphagia as evidenced by PEG in place for nutrition support.   Goal: Intake to meet >90% of estimated nutrition needs.  Monitor:  TF tolerance/adequacy, weight trend, labs.  Reason for Assessment: MD Consult for TF initiation and management.  37 y.o. male  Admitting Dx: Sepsis  ASSESSMENT: Patient is a 37 y.o. male with past medical history of advanced multiple sclerosis, quadriparesis, neurogenic bladder, chronic indwelling Foley catheter, status post PEG tube implant, decubitus ulcer stage II, recurrent UTI, pneumonia; presented from SNF with fever and chills; admitted on 12/5 with sepsis.  WOC RN has seen patient for multiple wounds. Discussed usual TF regimen with patient's mother; she reports that he was at a facility prior to admission and he received Osmolite TF 24 hours/day at 65 ml/h, rate recently reduced from 70 ml/h. She suspects that his weight is elevated due to fluid retention. He has had minimal UOP and has been receiving a lot of fluids.  Osmolite 1.5 at 65 ml/h provides 2340 kcals, 98 gm protein, 1189 ml free water daily.  Nutrition Focused Physical Exam (difficult to assess due to contractures):  Subcutaneous Fat:  Orbital Region: WNL Upper Arm Region: WNL Thoracic and Lumbar Region: NA  Muscle:  Temple Region: WNL Clavicle Bone Region: WNL Clavicle and Acromion Bone Region: WNL Scapular Bone Region: NA Dorsal Hand: NA Patellar Region: NA Anterior Thigh Region: NA Posterior Calf Region: NA  Edema: none  Height: Ht Readings from Last 1 Encounters:  12/12/12 6\' 3"   (1.905 m)    Weight: Wt Readings from Last 1 Encounters:  12/12/12 198 lb 3.1 oz (89.9 kg)    Ideal Body Weight: 89.1 kg  % Ideal Body Weight: 101%  Wt Readings from Last 10 Encounters:  12/12/12 198 lb 3.1 oz (89.9 kg)  01/29/12 183 lb 3.2 oz (83.1 kg)  05/31/11 145 lb 15.1 oz (66.2 kg)  04/23/11 166 lb (75.297 kg)  04/18/11 160 lb 15 oz (73 kg)  04/18/11 160 lb 15 oz (73 kg)  03/26/11 186 lb 15.2 oz (84.8 kg)  03/26/11 186 lb 15.2 oz (84.8 kg)  02/01/11 155 lb 13.8 oz (70.7 kg)  12/26/10 166 lb 14.4 oz (75.705 kg)    Usual Body Weight: 183 lb 11 months ago  % Usual Body Weight: 108%  BMI:  Body mass index is 24.77 kg/(m^2).  Estimated Nutritional Needs: Kcal: 2050-2250 Protein: 115-130 gm Fluid: 2.1-2.3 L  Skin: Left ischium with unstageable wound, sacrum with 12X6 cm red area which appears to be pink dry scar tissue without open wound or drainage, right buttock with 3X5cm area of stage 2 healing wound, left heel with unstageable wound   Diet Order: NPO  EDUCATION NEEDS: -No education needs identified at this time   Intake/Output Summary (Last 24 hours) at 12/12/12 1248 Last data filed at 12/12/12 1200  Gross per 24 hour  Intake    475 ml  Output      0 ml  Net    475 ml    Last BM: PTA   Labs:   Recent Labs Lab 12/12/12 0251 12/12/12 0255  NA 140 135  K 4.1 4.0  CL 106 99  CO2  --  22  BUN 25* 25*  CREATININE 1.20 0.89  CALCIUM  --  8.9  GLUCOSE 105* 104*    CBG (last 3)  No results found for this basename: GLUCAP,  in the last 72 hours  Scheduled Meds: . baclofen  20 mg Per Tube TID  . buPROPion  75 mg Oral Daily  . collagenase   Topical Daily  . heparin  5,000 Units Subcutaneous Q8H  . levETIRAcetam  750 mg Per Tube BID  . pantoprazole (PROTONIX) IV  40 mg Intravenous Daily  . piperacillin-tazobactam (ZOSYN)  IV  3.375 g Intravenous Q8H  . polyethylene glycol  17 g Oral Daily  . sodium chloride  3 mL Intravenous Q12H  .  vancomycin  1,000 mg Intravenous Q8H    Continuous Infusions: . dextrose 5 % and 0.9 % NaCl with KCl 20 mEq/L 100 mL/hr at 12/12/12 1200    Past Medical History  Diagnosis Date  . MS (multiple sclerosis)   . 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 Childhood asthma   . Depression   . Neuromuscular disorder     Quadraperesis  . Recurrent UTI   . Dysphagia   . Bladder calculi   . Neurogenic bladder   . Shortness of breath   . Recurrent upper respiratory infection (URI)   . Normocytic anemia 05/28/2011    Past Surgical History  Procedure Laterality Date  . Lumbar puncture  10/12/2002  . Gastrostomy  04/16/2011    Procedure: GASTROSTOMY;  Surgeon: Liz Malady, MD;  Location: [facility] OR;  Service: General;  Laterality: N/A;  Open G-Tube placement    [name], RD, LDN, CNSC Pager [phone] After Hours Pager [phone]

## 2012-12-12 NOTE — ED Notes (Signed)
Phlebotomy at bedside.

## 2012-12-12 NOTE — ED Provider Notes (Signed)
CSN: [id number]     Arrival date & time 12/12/12  0231 History   First MD Initiated Contact with Patient 12/12/12 [phone]     Chief Complaint  Patient presents with  . Code Sepsis   (Consider location/radiation/quality/duration/timing/severity/associated sxs/prior Treatment) The history is provided by the nursing home and the EMS personnel. The history is limited by the condition of the patient (Altered mental status).   37 year old male with history of multiple sclerosis was transferred from nursing home because of fever and decreased level of consciousness. Fever is as noted tonight. Temperature was reported to be 104. EMS noted tachycardia.  Past Medical History  Diagnosis Date  . MS (multiple sclerosis)   . 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 Childhood asthma   . Depression   . Neuromuscular disorder     Quadraperesis  . Recurrent UTI   . Dysphagia   . Bladder calculi   . Neurogenic bladder   . Shortness of breath   . Recurrent upper respiratory infection (URI)   . Normocytic anemia 05/28/2011   Past Surgical History  Procedure Laterality Date  . Lumbar puncture  10/12/2002  . Gastrostomy  04/16/2011    Procedure: GASTROSTOMY;  Surgeon: Liz Malady, MD;  Location: [facility] OR;  Service: General;  Laterality: N/A;  Open G-Tube placement   Family History  Problem Relation Age of Onset  . Asthma Mother    History  Substance Use Topics  . Smoking status: Former Smoker    Types: Cigarettes    Quit date: 02/02/1999  . Smokeless tobacco: Former Neurosurgeon  . Alcohol Use: No    Review of Systems  Unable to perform ROS: Mental status change    Allergies  Review of patient's allergies indicates no known allergies.  Home Medications   Current Outpatient Rx  Name  Route  Sig  Dispense  Refill  . amoxicillin-clavulanate (AUGMENTIN) 875-125 MG per tablet   PEG Tube   1 tablet by PEG Tube route 2 (two) times daily. Starting 10/18/12         . ascorbic acid (VITAMIN C)  500 MG/5ML syrup   PEG Tube   500 mg by PEG Tube route 2 (two) times daily.         . baclofen (LIORESAL) 20 MG tablet   PEG Tube   20 mg by PEG Tube route 3 (three) times daily.         Marland Kitchen buPROPion (WELLBUTRIN) 75 MG tablet   PEG Tube   75 mg by PEG Tube route 3 (three) times daily.         . cephALEXin (KEFLEX) 250 MG/5ML suspension   Oral   Take 10 mLs (500 mg total) by mouth 4 (four) times daily. Take for 10 days.   400 mL   0   . cholecalciferol (VITAMIN D) 1000 UNITS tablet   PEG Tube   1,000 Units by PEG Tube route 2 (two) times daily.          . clindamycin (CLEOCIN) 150 MG capsule   Oral   Take 2 capsules (300 mg total) by mouth every 6 (six) hours.   56 capsule   0   . Cranberry Juice Powder 425 MG CAPS   PEG Tube   425 mg by PEG Tube route 2 (two) times daily.         Marland Kitchen docusate (COLACE) 50 MG/5ML liquid   PEG Tube   100 mg by PEG Tube route daily.         Marland Kitchen  Garlic Oil (ODORLESS GARLIC) 500 MG TABS   PEG Tube   1,000 mg by PEG Tube route daily.         Marland Kitchen ipratropium-albuterol (DUONEB) 0.5-2.5 (3) MG/3ML SOLN   Nebulization   Take 3 mLs by nebulization every 4 (four) hours as needed (for cough and congestion).          Marland Kitchen levETIRAcetam (KEPPRA) 100 MG/ML solution   Per Tube   Place 7.5 mLs (750 mg total) into feeding tube 2 (two) times daily.   473 mL   0   . loratadine (CLARITIN) 10 MG tablet   PEG Tube   10 mg by PEG Tube route daily.         . Multiple Vitamin (MULTIVITAMIN WITH MINERALS) TABS   PEG Tube   1 tablet by PEG Tube route daily.         . Nutritional Supplements (FEEDING SUPPLEMENT, OSMOLITE 1.5 CAL,) LIQD   Per Tube   Place into feeding tube continuous. 51ml/HR         . nystatin (MYCOSTATIN/NYSTOP) 100000 UNIT/GM POWD   Topical   Apply topically daily. Apply to groin and buttocks topically every shift for yeast         . polyethylene glycol (MIRALAX / GLYCOLAX) packet   PEG Tube   17 g by PEG Tube  route every other day.          . polyvinyl alcohol (LIQUIFILM TEARS) 1.4 % ophthalmic solution   Both Eyes   Place 1 drop into both eyes 3 (three) times daily.         . traMADol (ULTRAM) 50 MG tablet   PEG Tube   50 mg by PEG Tube route every 8 (eight) hours as needed for pain.          BP 132/87  Pulse 132  Resp 12  SpO2 99% Physical Exam  Nursing note and vitals reviewed.  37 year old male, who is contracted and minimally responsive, but in no acute distress. Vital signs are significant for tachycardia with heart rate 132, and fever with temperature 102.9. Oxygen saturation is 99%, which is normal. Head is normocephalic and atraumatic. PERRLA, EOMI. Oropharynx is clear. Neck is nontender and supple without adenopathy or JVD. Back is nontender and there is no CVA tenderness. Lungs are clear without rales, wheezes, or rhonchi. Chest is nontender. Heart is tachycardic without murmur. Abdomen is soft, flat, nontender without masses or hepatosplenomegaly and peristalsis is normoactive. PEG tube is present without signs of infection at site of insertion. Suprapubic catheter is also present with no evidence of infection at site of insertion. Extremities are markedly contracted. Skin is warm and dry. Stage II decubitus ulcers are present in the sacrum, gluteal areas, and left heel Neurologic: He responds to pain with general withdrawal, cranial nerves are grossly intact, there are no gross focal motor or sensory deficits.  ED Course  Procedures (including critical care time) Labs Review Results for orders placed during the hospital encounter of 12/12/12  CBC WITH DIFFERENTIAL      Result Value Range   WBC 20.9 (*) 4.0 - 10.5 K/uL   RBC 4.71  4.22 - 5.81 MIL/uL   Hemoglobin 13.7  13.0 - 17.0 g/dL   HCT 16.1  09.6 - 04.5 %   MCV 86.6  78.0 - 100.0 fL   MCH 29.1  26.0 - 34.0 pg   MCHC 33.6  30.0 - 36.0 g/dL   RDW 14.7  11.5 - 15.5 %   Platelets 362  150 - 400 K/uL    Neutrophils Relative % 87 (*) 43 - 77 %   Neutro Abs 18.0 (*) 1.7 - 7.7 K/uL   Lymphocytes Relative 6 (*) 12 - 46 %   Lymphs Abs 1.3  0.7 - 4.0 K/uL   Monocytes Relative 7  3 - 12 %   Monocytes Absolute 1.5 (*) 0.1 - 1.0 K/uL   Eosinophils Relative 0  0 - 5 %   Eosinophils Absolute 0.0  0.0 - 0.7 K/uL   Basophils Relative 0  0 - 1 %   Basophils Absolute 0.0  0.0 - 0.1 K/uL  COMPREHENSIVE METABOLIC PANEL      Result Value Range   Sodium 135  135 - 145 mEq/L   Potassium 4.0  3.5 - 5.1 mEq/L   Chloride 99  96 - 112 mEq/L   CO2 22  19 - 32 mEq/L   Glucose, Bld 104 (*) 70 - 99 mg/dL   BUN 25 (*) 6 - 23 mg/dL   Creatinine, Ser 1.61  0.50 - 1.35 mg/dL   Calcium 8.9  8.4 - 09.6 mg/dL   Total Protein 8.1  6.0 - 8.3 g/dL   Albumin 3.5  3.5 - 5.2 g/dL   AST 22  0 - 37 U/L   ALT 20  0 - 53 U/L   Alkaline Phosphatase 136 (*) 39 - 117 U/L   Total Bilirubin 0.3  0.3 - 1.2 mg/dL   GFR calc non Af Amer >90  >90 mL/min   GFR calc Af Amer >90  >90 mL/min  CG4 I-STAT (LACTIC ACID)      Result Value Range   Lactic Acid, Venous 1.88  0.5 - 2.2 mmol/L  POCT I-STAT, CHEM 8      Result Value Range   Sodium 140  135 - 145 mEq/L   Potassium 4.1  3.5 - 5.1 mEq/L   Chloride 106  96 - 112 mEq/L   BUN 25 (*) 6 - 23 mg/dL   Creatinine, Ser 0.45  0.50 - 1.35 mg/dL   Glucose, Bld 409 (*) 70 - 99 mg/dL   Calcium, Ion 8.11  9.14 - 1.23 mmol/L   TCO2 22  0 - 100 mmol/L   Hemoglobin 15.0  13.0 - 17.0 g/dL   HCT 78.2  95.6 - 21.3 %   Imaging Review Dg Chest Port 1 View  12/12/2012   CLINICAL DATA:  Code sepsis  EXAM: PORTABLE CHEST - 1 VIEW  COMPARISON:  None.  FINDINGS: Limited by positioning and hypoaeration. Hemidiaphragm elevation partially obscures the lung bases. Mild left lung base opacity, favor atelectasis. Cardiomediastinal contours within normal range. No definite pleural effusion or pneumothorax. No acute osseous finding.  IMPRESSION: Hypoaeration and mild left lung base opacity, favor  atelectasis.   Electronically Signed   By: Jearld Lesch M.D.   On: 12/12/2012 03:18   Images viewed by me.   Date: 12/12/2012  Rate: 130  Rhythm: sinus tachycardia  QRS Axis: normal  Intervals: QT prolonged  ST/T Wave abnormalities: nonspecific T wave changes  Conduction Disutrbances:none  Narrative Interpretation:  Sinus tachycardia, borderline QT prolonged elongation, nonspecific T wave changes. When compared with ECG of 02/09/2012, no significant changes are seen.  Old EKG Reviewed: unchanged  CRITICAL CARE Performed by: YQMVH,QIONG Total critical care time: 60 minutes Critical care time was exclusive of separately billable procedures and treating other patients. Critical care was necessary to treat or  prevent imminent or life-threatening deterioration. Critical care was time spent personally by me on the following activities: development of treatment plan with patient and/or surrogate as well as nursing, discussions with consultants, evaluation of patient's response to treatment, examination of patient, obtaining history from patient or surrogate, ordering and performing treatments and interventions, ordering and review of laboratory studies, ordering and review of radiographic studies, pulse oximetry and re-evaluation of patient's condition.  MDM   1. Sepsis   2. Sacral decubitus ulcer, stage II    Sepsis and nursing home patient with indwelling Foley catheter. He has several decubitus ulcers which are rather shallow and did not appear to be likely source of infection. Urosepsis is likely as well as pneumonia. Sepsis protocol as been initiated.  Oxygen saturation briefly declined to 85%. He was placed on nonrebreather mask with initial poor response and then saturations improved to 100%. Chest x-ray does not show any obvious infiltrate per my reading so he is started on antibiotics for presumed healthcare associated urinary tract infection. He is given vancomycin and  cefepime.  Oxygen level has remained up since above-noted episode in his been dropped back to nasal cannula and maintain his oxygen saturation. Heart rate has stabilized and are no of 110 to 115 following fluids. Case has been discussed with Dr. Allena Katz of [facility] who is agreed to admit the patient. There no step down beds immediately available. The patient has been hemodynamically stable in the ED, so he will be admitted to a telemetry bed.  Dione Booze, MD 12/12/12 [phone]

## 2012-12-12 NOTE — Consult Note (Addendum)
WOC wound consult note Reason for Consult: Consult requested for multiple wounds.  Pt is immobile, thin, incontinent, and contracted.  Difficult to reposition to reduce pressure.  Assessed wounds with mother at the bedside who desires to take him home and care for him.  She is very well-informed regarding positioning and topical plan of care for wounds.  Wound type: Left ischium with unstageable wound; 3X2cm, 80% red, 20% yellow slough.  Mod amt yellow drainage, no odor. Sacrum with 12X6 cm red area which appears to be pink dry scar tissue without open wound or drainage. Right buttock with 3X5cm area of stage 2 healing wound, pink and dry without odor or drainage.  Left heel with unstageable wound 4X5cm, 50% dark red, 50% slough.  Mod tan drainage, some odor. Pressure Ulcer POA: Yes Dressing procedure/placement/frequency: Santyl ointment to left ischium and left heel for chemical debridement.  Reduce pressure to contracted extremities as much as possible.  Foam dressings to sacrum and right hip to protect and promote healing. Pt is on a Sport low-airloss bed to reduce pressure. Please re-consult if further assistance is needed.  Thank-you,  Cammie Mcgee MSN, RN, CWOCN, [place], CNS [phone]

## 2012-12-12 NOTE — H&P (Signed)
[facility] History and Physical  Patient: Terry Palmer  GNF:[id number]  DOB: Mar 13, 1975  DOS: the patient was seen and examined on 12/12/2012 PCP: Terry Northern, MD  Chief Complaint: Altered mental status and fever  HPI: Terry Palmer is a 37 y.o. male with Past medical history of multiple sclerosis and neurogenic bladder, chronic indwelling Foley catheter, status post PEG tube implant, decubitus ulcer stage II. The patient is coming from SNF. The history has been obtained from patient's wife was present at bedside and at the documentation. The patient is nonverbal at his baseline and currently his mentation is altered. As per the wife she was called from the nursing home because pt was having recurrent episodes of vomiting and was not acting at his baseline. He also happen to have fever before they called the EMS. As per the EMS the was febrile with temp of 104, and 90% on room air, more lethargic then his baseline. I tried to reach the nursing home [facility] on but did not get any replied back as of yet. Wife also noted that he has tendency to have facial spasms on and off and currently she thinks that his lower jaw has been in spasm. Also she thinks that the pt's lower lip's rhythmic movement are new. As per wife she has not seen anything unusual other then above finding.  Review of Systems: as mentioned in the history of present illness.  A Comprehensive review of the other systems is negative.  Past Medical History  Diagnosis Date  . MS (multiple sclerosis)   . 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 Childhood asthma   . Depression   . Neuromuscular disorder     Quadraperesis  . Recurrent UTI   . Dysphagia   . Bladder calculi   . Neurogenic bladder   . Shortness of breath   . Recurrent upper respiratory infection (URI)   . Normocytic anemia 05/28/2011   Past Surgical History  Procedure Laterality Date  . Lumbar puncture  10/12/2002  .  Gastrostomy  04/16/2011    Procedure: GASTROSTOMY;  Surgeon: Terry Malady, MD;  Location: [facility] OR;  Service: General;  Laterality: N/A;  Open G-Tube placement   Social History:  reports that he quit smoking about 13 years ago. His smoking use included Cigarettes. He smoked 0.00 packs per day. He has quit using smokeless tobacco. He reports that he does not drink alcohol. His drug history is not on file. Independent for most of his  ADL.  No Known Allergies  Family History  Problem Relation Age of Onset  . Asthma Mother     Prior to Admission medications   Medication Sig Start Date End Date Taking? Authorizing Provider  ascorbic acid (VITAMIN C) 500 MG/5ML syrup 500 mg by PEG Tube route 2 (two) times daily.   Yes Historical Provider, MD  baclofen (LIORESAL) 20 MG tablet 20 mg by PEG Tube route 3 (three) times daily.   Yes Historical Provider, MD  buPROPion (WELLBUTRIN) 75 MG tablet 75 mg by PEG Tube route 3 (three) times daily.   Yes Historical Provider, MD  cholecalciferol (VITAMIN D) 1000 UNITS tablet 1,000 Units by PEG Tube route 2 (two) times daily.    Yes Historical Provider, MD  Cranberry Juice Powder 425 MG CAPS 425 mg by PEG Tube route 2 (two) times daily.   Yes Historical Provider, MD  Cranberry-Vitamin C-Inulin (UTI-STAT) LIQD 30 mLs by PEG Tube route 2 (two) times daily. 3875mg /58ml  Yes Historical Provider, MD  docusate (COLACE) 50 MG/5ML liquid 100 mg by PEG Tube route daily.   Yes Historical Provider, MD  Garlic Oil (ODORLESS GARLIC) 500 MG TABS 1,000 mg by PEG Tube route daily.   Yes Historical Provider, MD  ipratropium-albuterol (DUONEB) 0.5-2.5 (3) MG/3ML SOLN Take 3 mLs by nebulization every 4 (four) hours as needed (for cough and congestion).    Yes Historical Provider, MD  levETIRAcetam (KEPPRA) 100 MG/ML solution Place 7.5 mLs (750 mg total) into feeding tube 2 (two) times daily. 02/08/12  Yes Terry Norman, MD  loratadine (CLARITIN) 10 MG tablet 10 mg by PEG Tube route  daily.   Yes Historical Provider, MD  Menthol-Zinc Oxide (CALMOSEPTINE) 0.44-20.625 % OINT Apply topically every 12 (twelve) hours as needed (to penis for irritation).   Yes Historical Provider, MD  Multiple Vitamin (MULTIVITAMIN WITH MINERALS) TABS 1 tablet by PEG Tube route daily.   Yes Historical Provider, MD  Nutritional Supplements (FEEDING SUPPLEMENT, OSMOLITE 1.5 CAL,) LIQD Place into feeding tube continuous. 79ml/HR   Yes Historical Provider, MD  polyethylene glycol (MIRALAX / GLYCOLAX) packet 17 g by PEG Tube route every other day.    Yes Historical Provider, MD  polyvinyl alcohol (LIQUIFILM TEARS) 1.4 % ophthalmic solution Place 1 drop into both eyes 3 (three) times daily.   Yes Historical Provider, MD  promethazine (PHENERGAN) 25 MG/ML injection Inject 25 mg into the vein every 6 (six) hours as needed for nausea or vomiting.   Yes Historical Provider, MD  traMADol (ULTRAM) 50 MG tablet 50 mg by PEG Tube route every 8 (eight) hours as needed for pain.   Yes Historical Provider, MD    Physical Exam: Filed Vitals:   12/12/12 0410 12/12/12 0530 12/12/12 0545 12/12/12 0630  BP:  121/64 125/74 132/81  Pulse:  111 116 123  Temp: 100.9 F (38.3 C)     TempSrc: Rectal     Resp:  19 17 17   SpO2:  98% 97% 98%    General: Awake not following commands and in distress,  Rhythmic movement of his lower lip, Eyes: PERRL ENT: Oral Mucosa coated with reddish material, dry.lower jaw appears in spasm on the left side   Neck: No  JVD Cardiovascular: S1 and S2 Present, no  Murmur, Peripheral Pulses Present Respiratory: Bilateral Air entry equal and Decreased, Clear to Auscultation,   C occasional rackles,no  wheezes Abdomen: Bowel Sound Present, Soft, peg tube site appear normal without any redness or induration  Skin: Decubitus ulcer on sacrum as well as on heels stage II without any infection Extremities: No Pedal edema,  Neurologic: Mental status awake but not following commands, Cranial  Nerves pupils are reactive, Motor strength spastic at his baseline, Sensation difficult to assess, reflexes difficult to assess  Labs on Admission:  CBC:  Recent Labs Lab 12/12/12 0251 12/12/12 0255  WBC  --  20.9*  NEUTROABS  --  18.0*  HGB 15.0 13.7  HCT 44.0 40.8  MCV  --  86.6  PLT  --  362    CMP     Component Value Date/Time   NA 135 12/12/2012 0255   K 4.0 12/12/2012 0255   CL 99 12/12/2012 0255   CO2 22 12/12/2012 0255   GLUCOSE 104* 12/12/2012 0255   BUN 25* 12/12/2012 0255   CREATININE 0.89 12/12/2012 0255   CALCIUM 8.9 12/12/2012 0255   PROT 8.1 12/12/2012 0255   ALBUMIN 3.5 12/12/2012 0255   AST 22 12/12/2012 0255  ALT 20 12/12/2012 0255   ALKPHOS 136* 12/12/2012 0255   BILITOT 0.3 12/12/2012 0255   GFRNONAA >90 12/12/2012 0255   GFRAA >90 12/12/2012 0255    No results found for this basename: LIPASE, AMYLASE,  in the last 168 hours No results found for this basename: AMMONIA,  in the last 168 hours  No results found for this basename: CKTOTAL, CKMB, CKMBINDEX, TROPONINI,  in the last 168 hours BNP (last 3 results) No results found for this basename: PROBNP,  in the last 8760 hours  Radiological Exams on Admission: Dg Chest Port 1 View  12/12/2012   CLINICAL DATA:  Code sepsis  EXAM: PORTABLE CHEST - 1 VIEW  COMPARISON:  None.  FINDINGS: Limited by positioning and hypoaeration. Hemidiaphragm elevation partially obscures the lung bases. Mild left lung base opacity, favor atelectasis. Cardiomediastinal contours within normal range. No definite pleural effusion or pneumothorax. No acute osseous finding.  IMPRESSION: Hypoaeration and mild left lung base opacity, favor atelectasis.   Electronically Signed   By: Jearld Lesch M.D.   On: 12/12/2012 03:18    EKG: Sinus tachycardia on telemetry  Assessment/Plan Principal Problem:   Sepsis Active Problems:   Multiple sclerosis   FTT (failure to thrive) in adult   Sacral decubitus ulcer, stage II   Vomiting   1.  Sepsis The patient is presenting with fever , tachycardia, vomiting with altered mental status leukocytosis and  Pyuria, suggestive of sepsis secondary to UTI. In the past he has been admitted with a similar presentation and has responded to IV Zosyn. Therefore I would put him on IV vancomycin and IV Zosyn at present. Patient has received IV bolus of 2 L repeat another 500 L of IV bolus and continue him on 125 cc of fluid. He will be admitted to the step down unit for closer monitoring. There is also a possibility of an early aspiration pneumonia as he has episodes of vomiting. Therefore I will keep his medication on hold via PEG tube. I will change them to IV.  2. Acute encephalopathy Current etiology unclear more likely secondary to sepsis I will get an EEG as he has rhythmic movement of his lower jaw, if the EEG is positive for the consultation from neurology may be needed. I would continue him on IV Keppra at home dose  3.Vomiting  His abdomen is soft, elevated lipase levels and an x-ray of the abdomen for further workup. Hold medication and feeding via PEG tube at present. IV Protonix    DVT Prophylaxis: subcutaneous Heparin Nutrition: N.p.o., Hold feeding via PEG tube  Code Status: Full as per discussion with wife, Family Communication: Wife  was present at bedside, opportunity was given to ask question and all questions were answered satisfactorily at the time of interview. Disposition: Admitted to inpatient in step-down unit.  Author: Lynden Oxford, MD [facility] Pager: [phone] 12/12/2012, 6:56 AM    If 7PM-7AM, please contact night-coverage [email or web address] Password [email or web address]

## 2012-12-12 NOTE — Consult Note (Signed)
Urology Consult  Referring physician:P Allena Katz Reason for referral: ? Blocked s/p tube  Chief Complaint: ? Blocked s/p tube  History of Present Illness: Admitted with fever and contstitutional symptoms; advanced MS with contractures; past hx of bladder stones assess 2013 by Dr Retta Diones; hx of recurrent UTI; elevated WBC; serum Cr normal; CT scan oct 2014 demonstrated normal kidneys with no bladder stones and s/p tube Leaking around catheter Apparently it did not aspirate Modifying factors: There are no other modifying factors  Associated signs and symptoms: There are no other associated signs and symptoms Aggravating and relieving factors: There are no other aggravating or relieving factors Severity: Moderate Duration: Persistent    Past Medical History  Diagnosis Date  . MS (multiple sclerosis)   . 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 Childhood asthma   . Depression   . Neuromuscular disorder     Quadraperesis  . Recurrent UTI   . Dysphagia   . Bladder calculi   . Neurogenic bladder   . Shortness of breath   . Recurrent upper respiratory infection (URI)   . Normocytic anemia 05/28/2011   Past Surgical History  Procedure Laterality Date  . Lumbar puncture  10/12/2002  . Gastrostomy  04/16/2011    Procedure: GASTROSTOMY;  Surgeon: Liz Malady, MD;  Location: [facility] OR;  Service: General;  Laterality: N/A;  Open G-Tube placement    Medications: I have reviewed the patient's current medications. Allergies: No Known Allergies  Family History  Problem Relation Age of Onset  . Asthma Mother    Social History:  reports that he quit smoking about 13 years ago. His smoking use included Cigarettes. He smoked 0.00 packs per day. He has quit using smokeless tobacco. He reports that he does not drink alcohol. His drug history is not on file.  ROS: All systems are reviewed and negative except as noted. Rest negavtive  Physical Exam:  Vital signs in last 24 hours: Temp:   [100.4 F (38 C)-102.9 F (39.4 C)] 100.4 F (38 C) (12/05 0726) Pulse Rate:  [99-132] 99 (12/05 1400) Resp:  [9-20] 17 (12/05 1400) BP: (115-143)/(64-97) 122/70 mmHg (12/05 1400) SpO2:  [97 %-100 %] 98 % (12/05 1400) Weight:  [89.9 kg (198 lb 3.1 oz)] 89.9 kg (198 lb 3.1 oz) (12/05 0800)  Cardiovascular: Skin warm; not flushed Respiratory: Breaths quiet; no shortness of breath Abdomen: No masses Neurological: Normal sensation to touch Musculoskeletal: Normal motor function arms and legs Lymphatics: No inguinal adenopathy Skin: No rashes Genitourinary:s/p tube draining minimally with no blood in urine  Laboratory Data:  Results for orders placed during the hospital encounter of 12/12/12 (from the past 72 hour(s))  CG4 I-STAT (LACTIC ACID)     Status: None   Collection Time    12/12/12  2:50 AM      Result Value Range   Lactic Acid, Venous 1.88  0.5 - 2.2 mmol/L  POCT I-STAT, CHEM 8     Status: Abnormal   Collection Time    12/12/12  2:51 AM      Result Value Range   Sodium 140  135 - 145 mEq/L   Potassium 4.1  3.5 - 5.1 mEq/L   Chloride 106  96 - 112 mEq/L   BUN 25 (*) 6 - 23 mg/dL   Creatinine, Ser 1.61  0.50 - 1.35 mg/dL   Glucose, Bld 096 (*) 70 - 99 mg/dL   Calcium, Ion 0.45  4.09 - 1.23 mmol/L   TCO2 22  0 -  100 mmol/L   Hemoglobin 15.0  13.0 - 17.0 g/dL   HCT 21.3  08.6 - 57.8 %  CBC WITH DIFFERENTIAL     Status: Abnormal   Collection Time    12/12/12  2:55 AM      Result Value Range   WBC 20.9 (*) 4.0 - 10.5 K/uL   RBC 4.71  4.22 - 5.81 MIL/uL   Hemoglobin 13.7  13.0 - 17.0 g/dL   HCT 46.9  62.9 - 52.8 %   MCV 86.6  78.0 - 100.0 fL   MCH 29.1  26.0 - 34.0 pg   MCHC 33.6  30.0 - 36.0 g/dL   RDW 41.3  24.4 - 01.0 %   Platelets 362  150 - 400 K/uL   Neutrophils Relative % 87 (*) 43 - 77 %   Neutro Abs 18.0 (*) 1.7 - 7.7 K/uL   Lymphocytes Relative 6 (*) 12 - 46 %   Lymphs Abs 1.3  0.7 - 4.0 K/uL   Monocytes Relative 7  3 - 12 %   Monocytes Absolute 1.5  (*) 0.1 - 1.0 K/uL   Eosinophils Relative 0  0 - 5 %   Eosinophils Absolute 0.0  0.0 - 0.7 K/uL   Basophils Relative 0  0 - 1 %   Basophils Absolute 0.0  0.0 - 0.1 K/uL  COMPREHENSIVE METABOLIC PANEL     Status: Abnormal   Collection Time    12/12/12  2:55 AM      Result Value Range   Sodium 135  135 - 145 mEq/L   Potassium 4.0  3.5 - 5.1 mEq/L   Chloride 99  96 - 112 mEq/L   CO2 22  19 - 32 mEq/L   Glucose, Bld 104 (*) 70 - 99 mg/dL   BUN 25 (*) 6 - 23 mg/dL   Creatinine, Ser 2.72  0.50 - 1.35 mg/dL   Calcium 8.9  8.4 - 53.6 mg/dL   Total Protein 8.1  6.0 - 8.3 g/dL   Albumin 3.5  3.5 - 5.2 g/dL   AST 22  0 - 37 U/L   ALT 20  0 - 53 U/L   Alkaline Phosphatase 136 (*) 39 - 117 U/L   Total Bilirubin 0.3  0.3 - 1.2 mg/dL   GFR calc non Af Amer >90  >90 mL/min   GFR calc Af Amer >90  >90 mL/min   Comment: (NOTE)     The eGFR has been calculated using the CKD EPI equation.     This calculation has not been validated in all clinical situations.     eGFR's persistently <90 mL/min signify possible Chronic Kidney     Disease.  PROCALCITONIN     Status: None   Collection Time    12/12/12  2:55 AM      Result Value Range   Procalcitonin <0.10     Comment:            Interpretation:     PCT (Procalcitonin) <= 0.5 ng/mL:     Systemic infection (sepsis) is not likely.     Local bacterial infection is possible.     (NOTE)             ICU PCT Algorithm               Non ICU PCT Algorithm        ----------------------------     ------------------------------  PCT < 0.25 ng/mL                 PCT < 0.1 ng/mL         Stopping of antibiotics            Stopping of antibiotics           strongly encouraged.               strongly encouraged.        ----------------------------     ------------------------------           PCT level decrease by               PCT < 0.25 ng/mL           >= 80% from peak PCT           OR PCT 0.25 - 0.5 ng/mL          Stopping of antibiotics                                                  encouraged.         Stopping of antibiotics               encouraged.        ----------------------------     ------------------------------           PCT level decrease by              PCT >= 0.25 ng/mL           < 80% from peak PCT            AND PCT >= 0.5 ng/mL            Continuing antibiotics                                                  encouraged.           Continuing antibiotics                encouraged.        ----------------------------     ------------------------------         PCT level increase compared          PCT > 0.5 ng/mL             with peak PCT AND              PCT >= 0.5 ng/mL             Escalation of antibiotics                                              strongly encouraged.          Escalation of antibiotics            strongly encouraged.  LIPASE, BLOOD     Status: None   Collection Time    12/12/12  2:55 AM      Result Value Range   Lipase 15  11 -  59 U/L  URINALYSIS, ROUTINE W REFLEX MICROSCOPIC     Status: Abnormal   Collection Time    12/12/12  5:46 AM      Result Value Range   Color, Urine YELLOW  YELLOW   APPearance CLOUDY (*) CLEAR   Specific Gravity, Urine 1.016  1.005 - 1.030   pH 8.5 (*) 5.0 - 8.0   Glucose, UA NEGATIVE  NEGATIVE mg/dL   Hgb urine dipstick SMALL (*) NEGATIVE   Bilirubin Urine NEGATIVE  NEGATIVE   Ketones, ur NEGATIVE  NEGATIVE mg/dL   Protein, ur 161 (*) NEGATIVE mg/dL   Urobilinogen, UA 0.2  0.0 - 1.0 mg/dL   Nitrite NEGATIVE  NEGATIVE   Leukocytes, UA LARGE (*) NEGATIVE  URINE MICROSCOPIC-ADD ON     Status: Abnormal   Collection Time    12/12/12  5:46 AM      Result Value Range   WBC, UA 7-10  <3 WBC/hpf   RBC / HPF 3-6  <3 RBC/hpf   Bacteria, UA MANY (*) RARE   Crystals TRIPLE PHOSPHATE CRYSTALS (*) NEGATIVE   Urine-Other LESS THAN 10 mL OF URINE SUBMITTED     Comment: MICROSCOPIC EXAM PERFORMED ON UNCONCENTRATED URINE  POCT I-STAT 3, BLOOD GAS (G3+)     Status:  Abnormal   Collection Time    12/12/12  6:06 AM      Result Value Range   pH, Arterial 7.441  7.350 - 7.450   pCO2 arterial 30.8 (*) 35.0 - 45.0 mmHg   pO2, Arterial 119.0 (*) 80.0 - 100.0 mmHg   Bicarbonate 21.0  20.0 - 24.0 mEq/L   TCO2 22  0 - 100 mmol/L   O2 Saturation 99.0     Acid-base deficit 2.0  0.0 - 2.0 mmol/L   Patient temperature 98.6 F     Collection site RADIAL, ALLEN'S TEST ACCEPTABLE     Drawn by RT     Sample type ARTERIAL    MRSA PCR SCREENING     Status: None   Collection Time    12/12/12  7:49 AM      Result Value Range   MRSA by PCR NEGATIVE  NEGATIVE   Comment:            The GeneXpert MRSA Assay (FDA     approved for NASAL specimens     only), is one component of a     comprehensive MRSA colonization     surveillance program. It is not     intended to diagnose MRSA     infection nor to guide or     monitor treatment for     MRSA infections.  GLUCOSE, CAPILLARY     Status: None   Collection Time    12/12/12 12:44 PM      Result Value Range   Glucose-Capillary 87  70 - 99 mg/dL   Recent Results (from the past 240 hour(s))  MRSA PCR SCREENING     Status: None   Collection Time    12/12/12  7:49 AM      Result Value Range Status   MRSA by PCR NEGATIVE  NEGATIVE Final   Comment:            The GeneXpert MRSA Assay (FDA     approved for NASAL specimens     only), is one component of a     comprehensive MRSA colonization     surveillance program. It is not     intended to  diagnose MRSA     infection nor to guide or     monitor treatment for     MRSA infections.   Creatinine:  Recent Labs  12/12/12 0251 12/12/12 0255  CREATININE 1.20 0.89    Xrays: See report/chart None to review  Impression/Assessment:  Blocked s/p tube; tube mobile but I COULD NOT FLUSH WITH STERILE WATER S/p tube with sterile technique easily changed and flushed with larger urine output Urine c/s sent Urine very pus like  Plan:  Treat UTI; flush s/p tube  prn  Partick Musselman A 12/12/2012, 3:27 PM

## 2012-12-13 DIAGNOSIS — G35 Multiple sclerosis: Secondary | ICD-10-CM

## 2012-12-13 DIAGNOSIS — R4182 Altered mental status, unspecified: Secondary | ICD-10-CM

## 2012-12-13 LAB — COMPREHENSIVE METABOLIC PANEL
ALT: 19 U/L (ref 0–53)
Alkaline Phosphatase: 115 U/L (ref 39–117)
CO2: 22 mEq/L (ref 19–32)
Calcium: 8.1 mg/dL — ABNORMAL LOW (ref 8.4–10.5)
Chloride: 108 mEq/L (ref 96–112)
GFR calc Af Amer: >90 mL/min (ref >90)
GFR calc non Af Amer: >90 mL/min (ref >90)
Glucose, Bld: 117 mg/dL — ABNORMAL HIGH (ref 70–99)
Sodium: 140 mEq/L (ref 135–145)
Total Bilirubin: 0.4 mg/dL (ref 0.3–1.2)
Total Protein: 6.5 g/dL (ref 6.0–8.3)

## 2012-12-13 LAB — GLUCOSE, CAPILLARY: Glucose-Capillary: 104 mg/dL — ABNORMAL HIGH (ref 70–99)

## 2012-12-13 LAB — CBC
HCT: 33.2 % — ABNORMAL LOW (ref 39.0–52.0)
MCH: 29.1 pg (ref 26.0–34.0)
MCHC: 33.7 g/dL (ref 30.0–36.0)
MCV: 86.2 fL (ref 78.0–100.0)
Platelets: 301 10*3/uL (ref 150–400)
RDW: 14.8 % (ref 11.5–15.5)
WBC: 15.3 10*3/uL — ABNORMAL HIGH (ref 4.0–10.5)

## 2012-12-13 NOTE — Procedures (Signed)
EEG report.  Brief clinical history:  37 y.o. male with Past medical history of advanced multiple sclerosis, quadriparesis, neurogenic bladder, chronic indwelling Foley catheter, status post PEG tube implant, decubitus ulcer stage II, recurrent UTI, pneumonia presented from SNF with fever, chills admitted with sepsis  Technique: this is a 17 channel routine scalp EEG performed at the bedside with bipolar and monopolar montages arranged in accordance to the international 10/20 system of electrode placement. One channel was dedicated to EKG recording.  The study was performed during wakefulness, drowsiness, and stage 2 sleep. No activating procedures performed.  Description:In the wakeful state, the best background consisted of a medium amplitude, posterior dominant, well sustained, symmetric and reactive 12 Hz rhythm. Beta activity was noted over the anterior head regions. Drowsiness demonstrated dropout of the alpha rhythm. Stage 2 sleep showed symmetric and synchronous sleep spindles without intermixed epileptiform discharges. No focal or generalized epileptiform discharges noted.  No slowing seen.  EKG showed sinus rhythm.  Impression: this is a normal awake and asleep EEG. The presence of beta activity is most likely a medication effect. Please, be aware that a normal EEG does not exclude the possibility of epilepsy.  Clinical correlation is advised.  Wyatt Portela, MD

## 2012-12-13 NOTE — Progress Notes (Addendum)
[facility] PROGRESS NOTE  Terry Palmer:[id number] DOB: 03-Jan-1976 DOA: 12/12/2012 PCP: Eloisa Northern, MD  Assessment/Plan: 37 y.o. male with Past medical history of advanced multiple sclerosis, quadriparesis, neurogenic bladder, chronic indwelling Foley catheter, status post PEG tube implant, decubitus ulcer stage II, recurrent UTI, pneumonia presented from SNF with fever, chills admitted with sepsis   1. Sepsis/UTI/leukocytosis/fever/SIRS; suprapubic cath changed per urology;  -improving; cont IV atx; f/u cultures/pend; IVF;   2. Advanced MS, quadriparesis; contractures, spasticity, progressive encephalopathy, EEG_ no acute findings; s/p PEG;  -cont supportive care, restarted baclofen, restarted PEG; KUB no acute findings;   3. Decub ulcers; cont wound care;   4. seizure disorder; cont keppra;   TF to RNF   Code Status: full Family Communication: father at the bedside yesterday (indicate person spoken with, relationship, and if by phone, the number) Disposition Plan: snf when ready   Consultants:  None   Procedures:  CXR  Antibiotics:  vanc 12/5<<<  Zosyn 12/5<<<< (indicate start date, and stop date if known)  HPI/Subjective: Non verbal  Objective: Filed Vitals:   12/13/12 0800  BP: 112/56  Pulse: 88  Temp:   Resp: 16    Intake/Output Summary (Last 24 hours) at 12/13/12 0913 Last data filed at 12/13/12 0805  Gross per 24 hour  Intake 3642.58 ml  Output   2350 ml  Net 1292.58 ml   Filed Weights   12/12/12 0800 12/13/12 0500  Weight: 89.9 kg (198 lb 3.1 oz) 87.5 kg (192 lb 14.4 oz)    Exam:   General:  Non verbal  Cardiovascular: s1,s2 rrr  Respiratory: few LL crackles  Abdomen: soft, + PEG  Musculoskeletal: contracted all extremities    Data Reviewed: Basic Metabolic Panel:  Recent Labs Lab 12/12/12 0251 12/12/12 0255 12/13/12 0344  NA 140 135 140  K 4.1 4.0 3.4*  CL 106 99 108  CO2  --  22 22  GLUCOSE 105* 104* 117*   BUN 25* 25* 11  CREATININE 1.20 0.89 0.71  CALCIUM  --  8.9 8.1*   Liver Function Tests:  Recent Labs Lab 12/12/12 0255 12/13/12 0344  AST 22 23  ALT 20 19  ALKPHOS 136* 115  BILITOT 0.3 0.4  PROT 8.1 6.5  ALBUMIN 3.5 2.8*    Recent Labs Lab 12/12/12 0255  LIPASE 15   No results found for this basename: AMMONIA,  in the last 168 hours CBC:  Recent Labs Lab 12/12/12 0251 12/12/12 0255 12/13/12 0344  WBC  --  20.9* 15.3*  NEUTROABS  --  18.0*  --   HGB 15.0 13.7 11.2*  HCT 44.0 40.8 33.2*  MCV  --  86.6 86.2  PLT  --  362 301   Cardiac Enzymes: No results found for this basename: CKTOTAL, CKMB, CKMBINDEX, TROPONINI,  in the last 168 hours BNP (last 3 results) No results found for this basename: PROBNP,  in the last 8760 hours CBG:  Recent Labs Lab 12/12/12 1244 12/12/12 1603 12/12/12 2029 12/13/12 0404  GLUCAP 87 87 92 128*    Recent Results (from the past 240 hour(s))  CULTURE, BLOOD (ROUTINE X 2)     Status: None   Collection Time    12/12/12  2:50 AM      Result Value Range Status   Specimen Description BLOOD RIGHT HAND   Final   Special Requests BOTTLES DRAWN AEROBIC ONLY 6CC   Final   Culture  Setup Time     Final  Value: 12/12/2012 09:02     Performed at [facility]   Culture     Final   Value:        BLOOD CULTURE RECEIVED NO GROWTH TO DATE CULTURE WILL BE HELD FOR 5 DAYS BEFORE ISSUING A FINAL NEGATIVE REPORT     Performed at [facility]   Report Status PENDING   Incomplete  CULTURE, BLOOD (ROUTINE X 2)     Status: None   Collection Time    12/12/12  2:55 AM      Result Value Range Status   Specimen Description BLOOD RIGHT ARM   Final   Special Requests     Final   Value: BOTTLES DRAWN AEROBIC AND ANAEROBIC 10CC BLUE, 8CC RED   Culture  Setup Time     Final   Value: 12/12/2012 09:01     Performed at [facility]   Culture     Final   Value:        BLOOD CULTURE RECEIVED NO GROWTH TO DATE CULTURE WILL BE  HELD FOR 5 DAYS BEFORE ISSUING A FINAL NEGATIVE REPORT     Performed at [facility]   Report Status PENDING   Incomplete  MRSA PCR SCREENING     Status: None   Collection Time    12/12/12  7:49 AM      Result Value Range Status   MRSA by PCR NEGATIVE  NEGATIVE Final   Comment:            The GeneXpert MRSA Assay (FDA     approved for NASAL specimens     only), is one component of a     comprehensive MRSA colonization     surveillance program. It is not     intended to diagnose MRSA     infection nor to guide or     monitor treatment for     MRSA infections.  STOOL CULTURE     Status: None   Collection Time    12/12/12  4:57 PM      Result Value Range Status   Specimen Description STOOL   Final   Special Requests NONE   Final   Culture PENDING   Incomplete   Report Status PENDING   Incomplete     Studies: Dg Chest Port 1 View  12/12/2012   CLINICAL DATA:  Code sepsis  EXAM: PORTABLE CHEST - 1 VIEW  COMPARISON:  None.  FINDINGS: Limited by positioning and hypoaeration. Hemidiaphragm elevation partially obscures the lung bases. Mild left lung base opacity, favor atelectasis. Cardiomediastinal contours within normal range. No definite pleural effusion or pneumothorax. No acute osseous finding.  IMPRESSION: Hypoaeration and mild left lung base opacity, favor atelectasis.   Electronically Signed   By: Jearld Lesch M.D.   On: 12/12/2012 03:18   Dg Abd Portable 1v  12/12/2012   CLINICAL DATA:  History of emesis  EXAM: PORTABLE ABDOMEN - 1 VIEW  COMPARISON:  Correlated with prior CT 10/28/2012  FINDINGS: The bowel gas pattern is normal. No radio-opaque calculi or other significant radiographic abnormality are seen. Gastric feeding tube is partially visualized. There is levoscoliosis of thoracolumbar spine likely positional.  IMPRESSION: Negative.   Electronically Signed   By: Salome Holmes M.D.   On: 12/12/2012 08:32    Scheduled Meds: . baclofen  20 mg Per Tube TID  .  buPROPion  75 mg Oral Daily  . collagenase   Topical Daily  . feeding  supplement (PRO-STAT SUGAR FREE 64)  30 mL Per Tube TID  . heparin  5,000 Units Subcutaneous Q8H  . levETIRAcetam  750 mg Per Tube BID  . pantoprazole (PROTONIX) IV  40 mg Intravenous Daily  . piperacillin-tazobactam (ZOSYN)  IV  3.375 g Intravenous Q8H  . polyethylene glycol  17 g Oral Daily  . sodium chloride  3 mL Intravenous Q12H  . vancomycin  1,000 mg Intravenous Q8H   Continuous Infusions: . dextrose 5 % and 0.9 % NaCl with KCl 20 mEq/L 100 mL/hr at 12/12/12 2000  . feeding supplement (OSMOLITE 1.5 CAL) 1,000 mL (12/13/12 0430)    Principal Problem:   Sepsis Active Problems:   Multiple sclerosis   FTT (failure to thrive) in adult   Sacral decubitus ulcer, stage II   Vomiting    Time spent: >35 minutes     Esperanza Sheets  [facility] Pager [phone]. If 7PM-7AM, please contact night-coverage at [email or web address], password [facility] 12/13/2012, 9:13 AM  LOS: 1 day

## 2012-12-14 LAB — BASIC METABOLIC PANEL
BUN: 10 mg/dL (ref 6–23)
CO2: 24 mEq/L (ref 19–32)
Calcium: 8.7 mg/dL (ref 8.4–10.5)
Creatinine, Ser: 0.66 mg/dL (ref 0.50–1.35)
GFR calc Af Amer: >90 mL/min (ref >90)
GFR calc non Af Amer: >90 mL/min (ref >90)
Glucose, Bld: 116 mg/dL — ABNORMAL HIGH (ref 70–99)
Potassium: 3.4 mEq/L — ABNORMAL LOW (ref 3.5–5.1)
Sodium: 138 mEq/L (ref 135–145)

## 2012-12-14 LAB — GLUCOSE, CAPILLARY
Glucose-Capillary: 105 mg/dL — ABNORMAL HIGH (ref 70–99)
Glucose-Capillary: 107 mg/dL — ABNORMAL HIGH (ref 70–99)
Glucose-Capillary: 118 mg/dL — ABNORMAL HIGH (ref 70–99)

## 2012-12-14 LAB — CBC
Hemoglobin: 12 g/dL — ABNORMAL LOW (ref 13.0–17.0)
MCH: 28.8 pg (ref 26.0–34.0)
MCHC: 32.8 g/dL (ref 30.0–36.0)
MCV: 88 fL (ref 78.0–100.0)
RBC: 4.16 MIL/uL — ABNORMAL LOW (ref 4.22–5.81)

## 2012-12-14 LAB — URINE CULTURE: Colony Count: >=100000

## 2012-12-14 MED ORDER — DEXTROSE 5 % IV SOLN
1.0000 g | INTRAVENOUS | Status: DC
  Administered 2012-12-14: 1 g via INTRAVENOUS
  Filled 2012-12-14 (×3): qty 10

## 2012-12-14 NOTE — Progress Notes (Signed)
Clinical Child psychotherapist (CSW) received call from [facility] reporting that patient is a resident a [facility] facility. Per chart review patient is non-verbal and has an altered mental status as the chief complaint. CSW contacted patient's wife Kristoph Sattler at [phone] who reported that she wanted to take the patient home. Wife reported that she would be able to provide 24 hour supervision and care for the patient at home. Wife stated that the patient's parents would also be able to provide care for the patient when the wife unavailable. Wife reported that she would be willing to learn how to assist the patient in tube feeding. Wife stated that patient has been a resident at [facility] since April 2013 and has declined in health. Wife reported that she is at the facility everyday along with the patient's mother and they provide daily care. CSW made [facility] aware of above. Please reconsult if further social work needs arise. CSW signing off.   Jetta Lout, LCSWA Weekend CSW [phone]

## 2012-12-14 NOTE — Progress Notes (Addendum)
[facility] PROGRESS NOTE  Terry Palmer:[id number] DOB: 04-Oct-1975 DOA: 12/12/2012 PCP: Eloisa Northern, MD  Assessment/Plan: 37 y.o. male with Past medical history of advanced multiple sclerosis, quadriparesis, neurogenic bladder, chronic indwelling Foley catheter, status post PEG tube implant, decubitus ulcer stage II, recurrent UTI, pneumonia presented from SNF with fever, chills admitted with sepsis   1. Sepsis/UTI/leukocytosis/fever/SIRS; suprapubic cath changed per urology;  -improving; cont IV atx; f/u cultures Proteus; atx changed to ceftriaxone; ;   2. Advanced MS, quadriparesis; contractures, spasticity, progressive encephalopathy, EEG_ no acute findings; s/p PEG;  -cont supportive care, restarted baclofen, restarted PEG; KUB no acute findings;   3. Decub ulcers; cont wound care;   4. seizure disorder; cont keppra;    Code Status: full Family Communication: mother at the bedside; father at the bedside yesterday (indicate person spoken with, relationship, and if by phone, the number) Disposition Plan: snf when ready   Consultants:  None   Procedures:  CXR  Antibiotics:  vanc 12/5<<<  Zosyn 12/5<<<< (indicate start date, and stop date if known)  HPI/Subjective: Non verbal  Objective: Filed Vitals:   12/14/12 0518  BP: 114/72  Pulse: 94  Temp: 98.6 F (37 C)  Resp: 16    Intake/Output Summary (Last 24 hours) at 12/14/12 1148 Last data filed at 12/14/12 0600  Gross per 24 hour  Intake 1831.83 ml  Output   1300 ml  Net 531.83 ml   Filed Weights   12/13/12 0500 12/13/12 2141 12/14/12 0500  Weight: 87.5 kg (192 lb 14.4 oz) 89.6 kg (197 lb 8.5 oz) 88.8 kg (195 lb 12.3 oz)    Exam:   General:  Non verbal  Cardiovascular: s1,s2 rrr  Respiratory: few LL crackles  Abdomen: soft, + PEG  Musculoskeletal: contracted all extremities    Data Reviewed: Basic Metabolic Panel:  Recent Labs Lab 12/12/12 0251 12/12/12 0255 12/13/12 0344  12/14/12 0240  NA 140 135 140 138  K 4.1 4.0 3.4* 3.4*  CL 106 99 108 102  CO2  --  22 22 24   GLUCOSE 105* 104* 117* 116*  BUN 25* 25* 11 10  CREATININE 1.20 0.89 0.71 0.66  CALCIUM  --  8.9 8.1* 8.7   Liver Function Tests:  Recent Labs Lab 12/12/12 0255 12/13/12 0344  AST 22 23  ALT 20 19  ALKPHOS 136* 115  BILITOT 0.3 0.4  PROT 8.1 6.5  ALBUMIN 3.5 2.8*    Recent Labs Lab 12/12/12 0255  LIPASE 15   No results found for this basename: AMMONIA,  in the last 168 hours CBC:  Recent Labs Lab 12/12/12 0251 12/12/12 0255 12/13/12 0344 12/14/12 0240  WBC  --  20.9* 15.3* 10.3  NEUTROABS  --  18.0*  --   --   HGB 15.0 13.7 11.2* 12.0*  HCT 44.0 40.8 33.2* 36.6*  MCV  --  86.6 86.2 88.0  PLT  --  362 301 276   Cardiac Enzymes: No results found for this basename: CKTOTAL, CKMB, CKMBINDEX, TROPONINI,  in the last 168 hours BNP (last 3 results) No results found for this basename: PROBNP,  in the last 8760 hours CBG:  Recent Labs Lab 12/13/12 1147 12/13/12 1608 12/13/12 2145 12/14/12 0029 12/14/12 0537  GLUCAP 96 87 104* 118* 101*    Recent Results (from the past 240 hour(s))  CULTURE, BLOOD (ROUTINE X 2)     Status: None   Collection Time    12/12/12  2:50 AM  Result Value Range Status   Specimen Description BLOOD RIGHT HAND   Final   Special Requests BOTTLES DRAWN AEROBIC ONLY [facility]   Final   Culture  Setup Time     Final   Value: 12/12/2012 09:02     Performed at [facility]   Culture     Final   Value:        BLOOD CULTURE RECEIVED NO GROWTH TO DATE CULTURE WILL BE HELD FOR 5 DAYS BEFORE ISSUING A FINAL NEGATIVE REPORT     Performed at [facility]   Report Status PENDING   Incomplete  CULTURE, BLOOD (ROUTINE X 2)     Status: None   Collection Time    12/12/12  2:55 AM      Result Value Range Status   Specimen Description BLOOD RIGHT ARM   Final   Special Requests     Final   Value: BOTTLES DRAWN AEROBIC AND ANAEROBIC 10CC  BLUE, 8CC RED   Culture  Setup Time     Final   Value: 12/12/2012 09:01     Performed at [facility]   Culture     Final   Value:        BLOOD CULTURE RECEIVED NO GROWTH TO DATE CULTURE WILL BE HELD FOR 5 DAYS BEFORE ISSUING A FINAL NEGATIVE REPORT     Performed at [facility]   Report Status PENDING   Incomplete  URINE CULTURE     Status: None   Collection Time    12/12/12  5:46 AM      Result Value Range Status   Specimen Description URINE, SUPRAPUBIC   Final   Special Requests NONE   Final   Culture  Setup Time     Final   Value: 12/12/2012 06:13     Performed at [facility] Count     Final   Value: >=100,000 COLONIES/ML     Performed at [facility]   Culture     Final   Value: PROTEUS MIRABILIS     Performed at [facility]   Report Status 12/14/2012 FINAL   Final   Organism ID, Bacteria PROTEUS MIRABILIS   Final  MRSA PCR SCREENING     Status: None   Collection Time    12/12/12  7:49 AM      Result Value Range Status   MRSA by PCR NEGATIVE  NEGATIVE Final   Comment:            The GeneXpert MRSA Assay (FDA     approved for NASAL specimens     only), is one component of a     comprehensive MRSA colonization     surveillance program. It is not     intended to diagnose MRSA     infection nor to guide or     monitor treatment for     MRSA infections.  STOOL CULTURE     Status: None   Collection Time    12/12/12  4:57 PM      Result Value Range Status   Specimen Description STOOL   Final   Special Requests NONE   Final   Culture PENDING   Incomplete   Report Status PENDING   Incomplete     Studies: No results found.  Scheduled Meds: . baclofen  20 mg Per Tube TID  . buPROPion  75 mg Oral Daily  . collagenase   Topical  Daily  . feeding supplement (PRO-STAT SUGAR FREE 64)  30 mL Per Tube TID  . heparin  5,000 Units Subcutaneous Q8H  . levETIRAcetam  750 mg Per Tube BID  . pantoprazole (PROTONIX) IV  40 mg  Intravenous Daily  . piperacillin-tazobactam (ZOSYN)  IV  3.375 g Intravenous Q8H  . polyethylene glycol  17 g Oral Daily  . sodium chloride  3 mL Intravenous Q12H  . vancomycin  1,000 mg Intravenous Q8H   Continuous Infusions: . dextrose 5 % and 0.9 % NaCl with KCl 20 mEq/L 50 mL/hr at 12/14/12 0600  . feeding supplement (OSMOLITE 1.5 CAL) 1,000 mL (12/14/12 0600)    Principal Problem:   Sepsis Active Problems:   Multiple sclerosis   FTT (failure to thrive) in adult   Sacral decubitus ulcer, stage II   Vomiting    Time spent: >35 minutes     Esperanza Sheets  [facility] Pager [phone]. If 7PM-7AM, please contact night-coverage at [email or web address], password [facility] 12/14/2012, 11:48 AM  LOS: 2 days

## 2012-12-14 NOTE — Progress Notes (Signed)
Pt has a skin separation on the posterior aspect of his penis. The wound is approximately two inches in length. The tissue appears clean and no drainage noted. Clean cloth wrapped around penis.

## 2012-12-15 LAB — GLUCOSE, CAPILLARY
Glucose-Capillary: 107 mg/dL — ABNORMAL HIGH (ref 70–99)
Glucose-Capillary: 76 mg/dL (ref 70–99)
Glucose-Capillary: 99 mg/dL (ref 70–99)

## 2012-12-15 LAB — URINE CULTURE: Colony Count: >=100000

## 2012-12-15 MED ORDER — COLLAGENASE 250 UNIT/GM EX OINT: TOPICAL_OINTMENT | Freq: Every day | CUTANEOUS | Status: DC

## 2012-12-15 MED ORDER — LEVOFLOXACIN 750 MG PO TABS: 750.0000 mg | ORAL_TABLET | Freq: Every day | ORAL | Status: DC

## 2012-12-15 NOTE — Discharge Summary (Addendum)
Physician Discharge Summary  Terry Palmer:[id number] DOB: 05-11-1975 DOA: 12/12/2012  PCP: Eloisa Northern, MD  Admit date: 12/12/2012 Discharge date: 12/15/2012  Time spent: >35 minutes  Recommendations for Outpatient Follow-up:  F/u with neurologist as scheduled  F/u with PCP in 1-2 weeks  Discharge Diagnoses:  Principal Problem:   Sepsis Active Problems:   Multiple sclerosis   FTT (failure to thrive) in adult   Sacral decubitus ulcer, stage II   Vomiting   Discharge Condition: stable  Diet recommendation: tube feeding   Filed Weights   12/13/12 2141 12/14/12 0500 12/15/12 0537  Weight: 89.6 kg (197 lb 8.5 oz) 88.8 kg (195 lb 12.3 oz) 88.7 kg (195 lb 8.8 oz)    History of present illness:  37 y.o. male with Past medical history of advanced multiple sclerosis, quadriparesis, neurogenic bladder, chronic indwelling Foley catheter, status post PEG tube implant, decubitus ulcer stage II, recurrent UTI, pneumonia presented from SNF with fever, chills admitted with sepsis   Hospital Course:  1. Sepsis/UTI/leukocytosis/fever/SIRS; suprapubic cath changed per urology;  -symptoms improvedon IV atx; f/u cultures Proteus; atx changed to ceftriaxone then to PO levofloxacin; patient afebrile, leukocytosis resolved  ;  2. Advanced MS, quadriparesis; contractures, spasticity, progressive encephalopathy, EEG_ no acute findings; s/p PEG;  -cont supportive care;   3. Decub ulcers; cont wound care;  4. seizure disorder; cont keppra;   D/w patient's wife, father, mother; they are thinking possible HHC and take him home in near future;   Updated [place], [place] [phone]   Procedures:  None  (i.e. Studies not automatically included, echos, thoracentesis, etc; not x-rays)  Consultations:  urology  Discharge Exam: Filed Vitals:   12/15/12 0537  BP: 121/69  Pulse: 85  Temp: 98.7 F (37.1 C)  Resp: 18    General: non verbal, does not follow commands   Cardiovascular: s1,s2 rrr Respiratory: CTA bl  Discharge Instructions  Discharge Orders   Future Appointments Provider Department Dept Phone   04/16/2013 9:00 AM York Spaniel, MD [facility] [phone]   Future Orders Complete By Expires   Diet - low sodium heart healthy  As directed    Discharge instructions  As directed    Comments:     Please follow up with primary care doctor in 1-2 weeks   Increase activity slowly  As directed        Medication List         ascorbic acid 500 MG/5ML syrup  Commonly known as:  VITAMIN C  500 mg by PEG Tube route 2 (two) times daily.     baclofen 20 MG tablet  Commonly known as:  LIORESAL  20 mg by PEG Tube route 3 (three) times daily.     buPROPion 75 MG tablet  Commonly known as:  WELLBUTRIN  75 mg by PEG Tube route 3 (three) times daily.     CALMOSEPTINE 0.44-20.625 % Oint  Generic drug:  Menthol-Zinc Oxide  Apply topically every 12 (twelve) hours as needed (to penis for irritation).     cholecalciferol 1000 UNITS tablet  Commonly known as:  VITAMIN D  1,000 Units by PEG Tube route 2 (two) times daily.     collagenase ointment  Commonly known as:  SANTYL  Apply topically daily.     Cranberry Juice Powder 425 MG Caps  425 mg by PEG Tube route 2 (two) times daily.     docusate 50 MG/5ML liquid  Commonly known as:  COLACE  100 mg by  PEG Tube route daily.     feeding supplement (OSMOLITE 1.5 CAL) Liqd  Place into feeding tube continuous. 7ml/HR     ipratropium-albuterol 0.5-2.5 (3) MG/3ML Soln  Commonly known as:  DUONEB  Take 3 mLs by nebulization every 4 (four) hours as needed (for cough and congestion).     levETIRAcetam 100 MG/ML solution  Commonly known as:  KEPPRA  Place 7.5 mLs (750 mg total) into feeding tube 2 (two) times daily.     levofloxacin 750 MG tablet  Commonly known as:  LEVAQUIN  Place 1 tablet (750 mg total) into feeding tube daily.     loratadine 10 MG tablet  Commonly  known as:  CLARITIN  10 mg by PEG Tube route daily.     multivitamin with minerals Tabs tablet  1 tablet by PEG Tube route daily.     ODORLESS GARLIC 500 MG Tabs  Generic drug:  Garlic Oil  1,000 mg by PEG Tube route daily.     polyethylene glycol packet  Commonly known as:  MIRALAX / GLYCOLAX  17 g by PEG Tube route every other day.     polyvinyl alcohol 1.4 % ophthalmic solution  Commonly known as:  LIQUIFILM TEARS  Place 1 drop into both eyes 3 (three) times daily.     promethazine 25 MG/ML injection  Commonly known as:  PHENERGAN  Inject 25 mg into the vein every 6 (six) hours as needed for nausea or vomiting.     traMADol 50 MG tablet  Commonly known as:  ULTRAM  50 mg by PEG Tube route every 8 (eight) hours as needed for pain.     UTI-STAT Liqd  30 mLs by PEG Tube route 2 (two) times daily. 3875mg /2ml       No Known Allergies     Follow-up Information   Follow up with AMIN, SAAD, MD In 1 week.   Specialty:  Internal Medicine   Contact information:   [address] [place] [address] [phone]        The results of significant diagnostics from this hospitalization (including imaging, microbiology, ancillary and laboratory) are listed below for reference.    Significant Diagnostic Studies: Dg Chest Port 1 View  12/12/2012   CLINICAL DATA:  Code sepsis  EXAM: PORTABLE CHEST - 1 VIEW  COMPARISON:  None.  FINDINGS: Limited by positioning and hypoaeration. Hemidiaphragm elevation partially obscures the lung bases. Mild left lung base opacity, favor atelectasis. Cardiomediastinal contours within normal range. No definite pleural effusion or pneumothorax. No acute osseous finding.  IMPRESSION: Hypoaeration and mild left lung base opacity, favor atelectasis.   Electronically Signed   By: Jearld Lesch M.D.   On: 12/12/2012 03:18   Dg Abd Portable 1v  12/12/2012   CLINICAL DATA:  History of emesis  EXAM: PORTABLE ABDOMEN - 1 VIEW  COMPARISON:  Correlated  with prior CT 10/28/2012  FINDINGS: The bowel gas pattern is normal. No radio-opaque calculi or other significant radiographic abnormality are seen. Gastric feeding tube is partially visualized. There is levoscoliosis of thoracolumbar spine likely positional.  IMPRESSION: Negative.   Electronically Signed   By: Salome Holmes M.D.   On: 12/12/2012 08:32    Microbiology: Recent Results (from the past 240 hour(s))  CULTURE, BLOOD (ROUTINE X 2)     Status: None   Collection Time    12/12/12  2:50 AM      Result Value Range Status   Specimen Description BLOOD RIGHT HAND  Final   Special Requests BOTTLES DRAWN AEROBIC ONLY [facility]   Final   Culture  Setup Time     Final   Value: 12/12/2012 09:02     Performed at [facility]   Culture     Final   Value:        BLOOD CULTURE RECEIVED NO GROWTH TO DATE CULTURE WILL BE HELD FOR 5 DAYS BEFORE ISSUING A FINAL NEGATIVE REPORT     Performed at [facility]   Report Status PENDING   Incomplete  CULTURE, BLOOD (ROUTINE X 2)     Status: None   Collection Time    12/12/12  2:55 AM      Result Value Range Status   Specimen Description BLOOD RIGHT ARM   Final   Special Requests     Final   Value: BOTTLES DRAWN AEROBIC AND ANAEROBIC 10CC BLUE, 8CC RED   Culture  Setup Time     Final   Value: 12/12/2012 09:01     Performed at [facility]   Culture     Final   Value:        BLOOD CULTURE RECEIVED NO GROWTH TO DATE CULTURE WILL BE HELD FOR 5 DAYS BEFORE ISSUING A FINAL NEGATIVE REPORT     Performed at [facility]   Report Status PENDING   Incomplete  URINE CULTURE     Status: None   Collection Time    12/12/12  5:46 AM      Result Value Range Status   Specimen Description URINE, SUPRAPUBIC   Final   Special Requests NONE   Final   Culture  Setup Time     Final   Value: 12/12/2012 06:13     Performed at [facility] Count     Final   Value: >=100,000 COLONIES/ML     Performed at [facility]    Culture     Final   Value: PROTEUS MIRABILIS     Performed at [facility]   Report Status 12/14/2012 FINAL   Final   Organism ID, Bacteria PROTEUS MIRABILIS   Final  MRSA PCR SCREENING     Status: None   Collection Time    12/12/12  7:49 AM      Result Value Range Status   MRSA by PCR NEGATIVE  NEGATIVE Final   Comment:            The GeneXpert MRSA Assay (FDA     approved for NASAL specimens     only), is one component of a     comprehensive MRSA colonization     surveillance program. It is not     intended to diagnose MRSA     infection nor to guide or     monitor treatment for     MRSA infections.  URINE CULTURE     Status: None   Collection Time    12/12/12  4:27 PM      Result Value Range Status   Specimen Description URINE, SUPRAPUBIC   Final   Special Requests NONE   Final   Culture  Setup Time     Final   Value: 12/12/2012 19:57     Performed at [facility] Count     Final   Value: >=100,000 COLONIES/ML     Performed at [facility]   Culture     Final   Value: PROTEUS  SPECIES     Performed at [facility]   Report Status PENDING   Incomplete  STOOL CULTURE     Status: None   Collection Time    12/12/12  4:57 PM      Result Value Range Status   Specimen Description STOOL   Final   Special Requests NONE   Final   Culture     Final   Value: NO SUSPICIOUS COLONIES, CONTINUING TO HOLD     Performed at [facility]   Report Status PENDING   Incomplete     Labs: Basic Metabolic Panel:  Recent Labs Lab 12/12/12 0251 12/12/12 0255 12/13/12 0344 12/14/12 0240  NA 140 135 140 138  K 4.1 4.0 3.4* 3.4*  CL 106 99 108 102  CO2  --  22 22 24   GLUCOSE 105* 104* 117* 116*  BUN 25* 25* 11 10  CREATININE 1.20 0.89 0.71 0.66  CALCIUM  --  8.9 8.1* 8.7   Liver Function Tests:  Recent Labs Lab 12/12/12 0255 12/13/12 0344  AST 22 23  ALT 20 19  ALKPHOS 136* 115  BILITOT 0.3 0.4  PROT 8.1 6.5  ALBUMIN  3.5 2.8*    Recent Labs Lab 12/12/12 0255  LIPASE 15   No results found for this basename: AMMONIA,  in the last 168 hours CBC:  Recent Labs Lab 12/12/12 0251 12/12/12 0255 12/13/12 0344 12/14/12 0240  WBC  --  20.9* 15.3* 10.3  NEUTROABS  --  18.0*  --   --   HGB 15.0 13.7 11.2* 12.0*  HCT 44.0 40.8 33.2* 36.6*  MCV  --  86.6 86.2 88.0  PLT  --  362 301 276   Cardiac Enzymes: No results found for this basename: CKTOTAL, CKMB, CKMBINDEX, TROPONINI,  in the last 168 hours BNP: BNP (last 3 results) No results found for this basename: PROBNP,  in the last 8760 hours CBG:  Recent Labs Lab 12/14/12 1209 12/14/12 1612 12/14/12 2156 12/15/12 0022 12/15/12 0539  GLUCAP 105* 107* 90 99 76       Signed:  Cozy Veale N  [facility] 12/15/2012, 10:12 AM    Addendum: patient has UTI due to chronic suprapubic cath; with h/o recurrent UTI; unfortunately he remains at risk for recurrent UTI due to medical illness;  Esperanza Sheets

## 2012-12-15 NOTE — Progress Notes (Signed)
D/C to [facility] via stretcher in stable condition with paperwork. Wife aware and meeting patient there.

## 2012-12-15 NOTE — Progress Notes (Signed)
CSW (Clinical Social Worker) prepared pt dc packet and placed with shadow chart. CSW arranged non-emergent ambulance transport. Pt family, pt nurse, and facility informed. CSW signing off.  Ceaser Ebeling, LCSWA [phone]  

## 2012-12-15 NOTE — Progress Notes (Addendum)
CSW (Clinical Child psychotherapist) informed by CM that home may no longer be the dc plan as wife was unclear on available assistance. CSW called pt wife and left voicemail to please call CSW back as soon as possible to discuss dc plan.  Pt is able to return to [facility] if needed.  Carliyah Cotterman, LCSWA [phone]

## 2012-12-15 NOTE — Clinical Documentation Improvement (Signed)
A cause and effect relationship may not be assumed and must be documented by a provider. Please clarify the relationship, if any, between foley catheter and UTI/SEPSIS.  Are the conditions:   Due to or associated with each other   Other (please specify)   Unrelated to each other   Unable to determine   Unknown                      SUPPORTING FACTORS: (As per Ed note) "Sepsis and nursing home patient with indwelling Foley catheter." (As per H&P) "Pyuria, suggestive of sepsis secondary to UTI."  Thank you, Nevin Bloodgood, RN, BSN, CCDS Clinical Documentation Specialist:  [phone]   [phone]=cell [place]- Health Information Management

## 2012-12-15 NOTE — Progress Notes (Signed)
Clinical Social Work Department BRIEF PSYCHOSOCIAL ASSESSMENT 12/15/2012  Patient:  Terry Palmer, Terry Palmer     Account Number:  [id number]     Admit date:  12/12/2012  Clinical Social Worker:  Harless Nakayama  Date/Time:  12/15/2012 12:35 PM  Referred by:  Physician  Date Referred:  12/15/2012 Referred for  SNF Placement   Other Referral:   Interview type:  Family Other interview type:   Spoke with pt wife    PSYCHOSOCIAL DATA Living Status:  FACILITY Admitted from facility:  [facility] Level of care:  Skilled Nursing Facility Primary support name:  Sahej Schrieber [phone] Primary support relationship to patient:  SPOUSE Degree of support available:   Pt has very supportive family    CURRENT CONCERNS Current Concerns  Post-Acute Placement   Other Concerns:    SOCIAL WORK ASSESSMENT / PLAN CSW spoke with pt wife regarding pt dc plans. Pt wife explained that she would prefer to take pt home but was unaware that more care was unavailable through insurance and would need to be private pay. Pt wife explained her concerns with the facility but that she is agreeable for pt to return. CSW did speak with pt wife about options if she would like for pt to dc to another facility. Pt wife reports believing this would be too rushed and she would just prefer pt to return to [facility]. CSW did explain process for switching facilities once pt discharges. CSW also offered to relay message to facility about pt family dissatisfaction with care. Pt wife greatful.    CSW paged MD to notify about change and to ask to please sign FL2.   Assessment/plan status:  Psychosocial Support/Ongoing Assessment of Needs Other assessment/ plan:   Information/referral to community resources:   CSW offered SNF list. Pt wife denied.    PATIENT'S/FAMILY'S RESPONSE TO PLAN OF CARE: Pt wife is agreeable to pt returning to SNF.       Jackelyne Sayer, LCSWA [phone]

## 2012-12-15 NOTE — Progress Notes (Signed)
Report called to receiving RN at [facility].

## 2012-12-16 LAB — STOOL CULTURE

## 2012-12-18 LAB — CULTURE, BLOOD (ROUTINE X 2): Culture: NO GROWTH

## 2013-01-02 ENCOUNTER — Other Ambulatory Visit: Payer: Self-pay | Admitting: Internal Medicine

## 2013-01-02 DIAGNOSIS — T148XXA Other injury of unspecified body region, initial encounter: Secondary | ICD-10-CM

## 2013-01-02 DIAGNOSIS — R509 Fever, unspecified: Secondary | ICD-10-CM

## 2013-01-06 ENCOUNTER — Encounter (HOSPITAL_BASED_OUTPATIENT_CLINIC_OR_DEPARTMENT_OTHER): Payer: Medicare HMO | Attending: General Surgery

## 2013-01-06 DIAGNOSIS — G35 Multiple sclerosis: Secondary | ICD-10-CM | POA: Diagnosis not present

## 2013-01-06 DIAGNOSIS — L89609 Pressure ulcer of unspecified heel, unspecified stage: Secondary | ICD-10-CM | POA: Diagnosis not present

## 2013-01-06 DIAGNOSIS — N319 Neuromuscular dysfunction of bladder, unspecified: Secondary | ICD-10-CM | POA: Insufficient documentation

## 2013-01-06 DIAGNOSIS — L89309 Pressure ulcer of unspecified buttock, unspecified stage: Secondary | ICD-10-CM | POA: Diagnosis not present

## 2013-01-06 DIAGNOSIS — Z9350 Unspecified cystostomy status: Secondary | ICD-10-CM | POA: Insufficient documentation

## 2013-01-06 DIAGNOSIS — R569 Unspecified convulsions: Secondary | ICD-10-CM | POA: Insufficient documentation

## 2013-01-06 DIAGNOSIS — Z79899 Other long term (current) drug therapy: Secondary | ICD-10-CM | POA: Insufficient documentation

## 2013-01-06 DIAGNOSIS — L8992 Pressure ulcer of unspecified site, stage 2: Secondary | ICD-10-CM | POA: Diagnosis not present

## 2013-01-07 NOTE — H&P (Signed)
NAMEJOANATHAN, AFFELDT NO.:  [id number]  MEDICAL RECORD NO.:  [id number]  LOCATION:                               FACILITY:  [facility]  PHYSICIAN:  Joanne Gavel, M.D.        DATE OF BIRTH:  February 12, 1975  DATE OF ADMISSION:  12/12/2012 DATE OF DISCHARGE:  12/15/2012                             HISTORY & PHYSICAL   CHIEF COMPLAINT:  Multiple decubitus ulcers.  HISTORY OF PRESENT ILLNESS:  This patient is a 37 year old man who developed multiple sclerosis 19 years ago.  He has a very serious case with multiple contractures and he is uncommunicative.  PAST MEDICAL HISTORY:  Significant for neurogenic bladder, toxic encephalopathy, seizures, and severe multiple sclerosis with quadriplegia and multiple contractions.  PAST SURGICAL HISTORY:  He has a suprapubic cystostomy and a PEG tube.  SOCIAL HISTORY:  Cigarettes none.  Alcohol none.  MEDICATIONS:  Baclofen, diazepam, Colace, albuterol, levetiracetam, loratadine, MiraLAX, Santyl, tramadol, Wellbutrin.  ALLERGIES:  None.  REVIEW OF SYSTEMS:  Essentially as above.  No history of heart disease, lung disease, or kidney disease.  PHYSICAL EXAMINATION:  VITAL SIGNS:  Temperature 99, pulse 100, respirations 16, blood pressure 109/72. GENERAL APPEARANCE:  The patient has contracted arms and legs.  He is not communicative except occasionally to moan.  On the left heel is a 3.9 x 3.7 relatively superficial wound with areas of granulation tissue and areas of very adherent slough.  In the initial tuberosity area on the left side, there is a 4.4 x 4.5 lesion, which seems to be more superficial, but is likewise covered with very thick adherent slough.  IMPRESSION:  Stage II decubitus ulcers and the patient with very severe multiple sclerosis.  We will continue treatment with Santyl and Hydrogel, and I will see him next week, try to get an idea of which way the wounds are going.  ADDENDUM:  The patient has an air mattress,  and is in a nursing care center where he needs to be moved very frequently.     Joanne Gavel, M.D.   ______________________________ Joanne Gavel, M.D.    RA/MEDQ  D:  01/06/2013  T:  01/07/2013  Job:  [id number]

## 2013-01-13 ENCOUNTER — Encounter (HOSPITAL_BASED_OUTPATIENT_CLINIC_OR_DEPARTMENT_OTHER): Payer: Medicare HMO | Attending: General Surgery

## 2013-01-13 DIAGNOSIS — L8995 Pressure ulcer of unspecified site, unstageable: Secondary | ICD-10-CM | POA: Insufficient documentation

## 2013-01-13 DIAGNOSIS — L89209 Pressure ulcer of unspecified hip, unspecified stage: Secondary | ICD-10-CM | POA: Insufficient documentation

## 2013-01-13 DIAGNOSIS — L8992 Pressure ulcer of unspecified site, stage 2: Secondary | ICD-10-CM | POA: Insufficient documentation

## 2013-01-13 DIAGNOSIS — L89309 Pressure ulcer of unspecified buttock, unspecified stage: Secondary | ICD-10-CM | POA: Insufficient documentation

## 2013-01-13 DIAGNOSIS — L89609 Pressure ulcer of unspecified heel, unspecified stage: Secondary | ICD-10-CM | POA: Insufficient documentation

## 2013-01-14 ENCOUNTER — Ambulatory Visit
Admission: RE | Admit: 2013-01-14 | Discharge: 2013-01-14 | Disposition: A | Discharge status: Home / Self Care | Payer: Commercial Managed Care - HMO | Source: Ambulatory Visit | Attending: Internal Medicine | Admitting: Internal Medicine

## 2013-01-14 DIAGNOSIS — T148XXA Other injury of unspecified body region, initial encounter: Secondary | ICD-10-CM

## 2013-01-14 DIAGNOSIS — R509 Fever, unspecified: Secondary | ICD-10-CM

## 2013-01-14 DIAGNOSIS — L24A9 Irritant contact dermatitis due friction or contact with other specified body fluids: Secondary | ICD-10-CM

## 2013-01-20 ENCOUNTER — Inpatient Hospital Stay (HOSPITAL_COMMUNITY)
Admission: EM | Admit: 2013-01-20 | Discharge: 2013-01-26 | DRG: 393 | Disposition: A | Discharge status: Home Health | Payer: Medicare HMO | Attending: Internal Medicine | Admitting: Internal Medicine

## 2013-01-20 ENCOUNTER — Encounter (HOSPITAL_COMMUNITY): Payer: Self-pay | Admitting: Emergency Medicine

## 2013-01-20 ENCOUNTER — Emergency Department (HOSPITAL_COMMUNITY): Payer: Medicare HMO

## 2013-01-20 DIAGNOSIS — Z87891 Personal history of nicotine dependence: Secondary | ICD-10-CM

## 2013-01-20 DIAGNOSIS — R627 Adult failure to thrive: Secondary | ICD-10-CM | POA: Diagnosis present

## 2013-01-20 DIAGNOSIS — G40909 Epilepsy, unspecified, not intractable, without status epilepticus: Secondary | ICD-10-CM | POA: Diagnosis present

## 2013-01-20 DIAGNOSIS — Y833 Surgical operation with formation of external stoma as the cause of abnormal reaction of the patient, or of later complication, without mention of misadventure at the time of the procedure: Secondary | ICD-10-CM | POA: Diagnosis present

## 2013-01-20 DIAGNOSIS — K56609 Unspecified intestinal obstruction, unspecified as to partial versus complete obstruction: Secondary | ICD-10-CM

## 2013-01-20 DIAGNOSIS — Z79899 Other long term (current) drug therapy: Secondary | ICD-10-CM

## 2013-01-20 DIAGNOSIS — F329 Major depressive disorder, single episode, unspecified: Secondary | ICD-10-CM | POA: Diagnosis present

## 2013-01-20 DIAGNOSIS — K315 Obstruction of duodenum: Secondary | ICD-10-CM | POA: Diagnosis present

## 2013-01-20 DIAGNOSIS — F3289 Other specified depressive episodes: Secondary | ICD-10-CM | POA: Diagnosis present

## 2013-01-20 DIAGNOSIS — G8250 Quadriplegia, unspecified: Secondary | ICD-10-CM | POA: Diagnosis present

## 2013-01-20 DIAGNOSIS — G35 Multiple sclerosis: Secondary | ICD-10-CM | POA: Diagnosis present

## 2013-01-20 DIAGNOSIS — J690 Pneumonitis due to inhalation of food and vomit: Secondary | ICD-10-CM

## 2013-01-20 DIAGNOSIS — K9423 Gastrostomy malfunction: Principal | ICD-10-CM | POA: Diagnosis present

## 2013-01-20 DIAGNOSIS — R1310 Dysphagia, unspecified: Secondary | ICD-10-CM | POA: Diagnosis present

## 2013-01-20 DIAGNOSIS — E46 Unspecified protein-calorie malnutrition: Secondary | ICD-10-CM

## 2013-01-20 DIAGNOSIS — A419 Sepsis, unspecified organism: Secondary | ICD-10-CM | POA: Diagnosis present

## 2013-01-20 DIAGNOSIS — K920 Hematemesis: Secondary | ICD-10-CM | POA: Diagnosis present

## 2013-01-20 HISTORY — DX: Pneumonitis due to inhalation of food and vomit: J69.0

## 2013-01-20 LAB — URINALYSIS, ROUTINE W REFLEX MICROSCOPIC
Bilirubin Urine: NEGATIVE
Glucose, UA: NEGATIVE mg/dL
Hgb urine dipstick: NEGATIVE
Ketones, ur: NEGATIVE mg/dL
Nitrite: NEGATIVE
PROTEIN: NEGATIVE mg/dL
Specific Gravity, Urine: 1.018 (ref 1.005–1.030)
UROBILINOGEN UA: 0.2 mg/dL (ref 0.0–1.0)
pH: 8.5 — ABNORMAL HIGH (ref 5.0–8.0)

## 2013-01-20 LAB — CBC WITH DIFFERENTIAL/PLATELET
BASOS PCT: 0 % (ref 0–1)
Basophils Absolute: 0 10*3/uL (ref 0.0–0.1)
EOS PCT: 1 % (ref 0–5)
Eosinophils Absolute: 0.3 10*3/uL (ref 0.0–0.7)
HCT: 41.9 % (ref 39.0–52.0)
Hemoglobin: 14.3 g/dL (ref 13.0–17.0)
LYMPHS ABS: 0.8 10*3/uL (ref 0.7–4.0)
Lymphocytes Relative: 3 % — ABNORMAL LOW (ref 12–46)
MCH: 28.6 pg (ref 26.0–34.0)
MCHC: 34.1 g/dL (ref 30.0–36.0)
MCV: 83.8 fL (ref 78.0–100.0)
MONO ABS: 0.8 10*3/uL (ref 0.1–1.0)
Monocytes Relative: 3 % (ref 3–12)
NEUTROS ABS: 26 10*3/uL — AB (ref 1.7–7.7)
Neutrophils Relative %: 93 % — ABNORMAL HIGH (ref 43–77)
PLATELETS: 392 10*3/uL (ref 150–400)
RBC: 5 MIL/uL (ref 4.22–5.81)
RDW: 14.3 % (ref 11.5–15.5)
WBC: 27.9 10*3/uL — ABNORMAL HIGH (ref 4.0–10.5)

## 2013-01-20 LAB — TYPE AND SCREEN
ABO/RH(D): A POS
ANTIBODY SCREEN: NEGATIVE

## 2013-01-20 LAB — COMPREHENSIVE METABOLIC PANEL
ALT: 19 U/L (ref 0–53)
AST: 24 U/L (ref 0–37)
Albumin: 3.7 g/dL (ref 3.5–5.2)
Alkaline Phosphatase: 149 U/L — ABNORMAL HIGH (ref 39–117)
BUN: 13 mg/dL (ref 6–23)
CALCIUM: 9.7 mg/dL (ref 8.4–10.5)
CO2: 23 meq/L (ref 19–32)
CREATININE: 0.61 mg/dL (ref 0.50–1.35)
Chloride: 97 mEq/L (ref 96–112)
GFR calc Af Amer: >90 mL/min (ref >90)
GLUCOSE: 108 mg/dL — AB (ref 70–99)
Potassium: 4.3 mEq/L (ref 3.7–5.3)
Sodium: 138 mEq/L (ref 137–147)
Total Bilirubin: 0.3 mg/dL (ref 0.3–1.2)
Total Protein: 9.1 g/dL — ABNORMAL HIGH (ref 6.0–8.3)

## 2013-01-20 LAB — URINE MICROSCOPIC-ADD ON

## 2013-01-20 LAB — ABO/RH: ABO/RH(D): A POS

## 2013-01-20 LAB — CG4 I-STAT (LACTIC ACID): Lactic Acid, Venous: 4 mmol/L — ABNORMAL HIGH (ref 0.5–2.2)

## 2013-01-20 LAB — PROTIME-INR
INR: 0.96 (ref 0.00–1.49)
Prothrombin Time: 12.6 seconds (ref 11.6–15.2)

## 2013-01-20 LAB — LIPASE, BLOOD: LIPASE: 29 U/L (ref 11–59)

## 2013-01-20 MED ORDER — IOHEXOL 300 MG/ML  SOLN
100.0000 mL | Freq: Once | INTRAMUSCULAR | Status: AC | PRN
  Administered 2013-01-20: 100 mL via INTRAVENOUS

## 2013-01-20 MED ORDER — PANTOPRAZOLE SODIUM 40 MG IV SOLR
40.0000 mg | Freq: Once | INTRAVENOUS | Status: AC
  Administered 2013-01-20: 40 mg via INTRAVENOUS
  Filled 2013-01-20: qty 40

## 2013-01-20 MED ORDER — SODIUM CHLORIDE 0.9 % IV BOLUS (SEPSIS)
1000.0000 mL | Freq: Once | INTRAVENOUS | Status: AC
  Administered 2013-01-20: 1000 mL via INTRAVENOUS

## 2013-01-20 MED ORDER — DIPHENHYDRAMINE HCL 50 MG/ML IJ SOLN
25.0000 mg | Freq: Once | INTRAMUSCULAR | Status: AC
  Administered 2013-01-20: 25 mg via INTRAVENOUS
  Filled 2013-01-20: qty 1

## 2013-01-20 MED ORDER — IOHEXOL 300 MG/ML  SOLN: 25.0000 mL | Freq: Once | INTRAMUSCULAR | Status: AC | PRN

## 2013-01-20 MED ORDER — ACETAMINOPHEN 650 MG RE SUPP: 650.0000 mg | Freq: Once | RECTAL | Status: DC

## 2013-01-20 MED ORDER — PIPERACILLIN-TAZOBACTAM 3.375 G IVPB 30 MIN
3.3750 g | Freq: Once | INTRAVENOUS | Status: AC
  Administered 2013-01-20: 3.375 g via INTRAVENOUS
  Filled 2013-01-20: qty 50

## 2013-01-20 MED ORDER — VANCOMYCIN HCL IN DEXTROSE 1-5 GM/200ML-% IV SOLN
1000.0000 mg | Freq: Three times a day (TID) | INTRAVENOUS | Status: DC
  Administered 2013-01-20 – 2013-01-23 (×8): 1000 mg via INTRAVENOUS
  Filled 2013-01-20 (×10): qty 200

## 2013-01-20 MED ORDER — ACETAMINOPHEN 650 MG RE SUPP
650.0000 mg | Freq: Once | RECTAL | Status: AC
  Administered 2013-01-20: 650 mg via RECTAL

## 2013-01-20 NOTE — H&P (Signed)
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  He has been vomiting on/off for 2 weeks per father is who present.  But today noted some blood in the vomit.  Father says his abd has been more bloated over the last several day.  Does not know of any diarrhea or fevers.  Pt sent from snf, sob, tachycardic.  Ct shows prob obstruction from his feeding tube.  Wbc is over 20.  Has fever.  Pt cannot provide any history.  Review of Systems:  Unobtainable  Past Medical History: Past Medical History  Diagnosis Date  . MS (multiple sclerosis)   . 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 Childhood asthma   . Depression   . Neuromuscular disorder     Quadraperesis  . Recurrent UTI   . Dysphagia   . Bladder calculi   . Neurogenic bladder   . Shortness of breath   . Recurrent upper respiratory infection (URI)   . Normocytic anemia 05/28/2011  . Aspiration pneumonia 01/20/2013   Past Surgical History  Procedure Laterality Date  . Lumbar puncture  10/12/2002  . Gastrostomy  04/16/2011    Procedure: GASTROSTOMY;  Surgeon: Zenovia Jarred, MD;  Location: [facility];  Service: General;  Laterality: N/A;  Open G-Tube placement    Medications: Prior to Admission medications   Medication Sig Start Date End Date Taking? Authorizing Provider  ascorbic acid (VITAMIN C) 500 MG/5ML syrup 500 mg by PEG Tube route 2 (two) times daily.   Yes Historical Provider, MD  baclofen (LIORESAL) 20 MG tablet 20 mg by PEG Tube route 3 (three) times daily.   Yes Historical Provider, MD  buPROPion (WELLBUTRIN) 75 MG tablet 75 mg by PEG Tube route 3 (three) times daily.   Yes Historical Provider, MD  cholecalciferol (VITAMIN D) 1000 UNITS tablet 1,000 Units by PEG Tube route 2 (two) times daily.    Yes Historical Provider, MD  collagenase (SANTYL) ointment Apply 1  application topically every other day.   Yes Historical Provider, MD  Cranberry Juice Powder 425 MG CAPS 425 mg by PEG Tube route 2 (two) times daily.   Yes Historical Provider, MD  Cranberry-Vitamin C-Inulin (UTI-STAT) LIQD 30 mLs by PEG Tube route 2 (two) times daily. 3875mg /4ml   Yes Historical Provider, MD  diazepam (VALIUM) 5 MG tablet 5 mg by PEG Tube route at bedtime.    Yes Historical Provider, MD  docusate (COLACE) 50 MG/5ML liquid 100 mg by PEG Tube route daily.   Yes Historical Provider, MD  Garlic Oil (ODORLESS GARLIC) 856 MG TABS 314 mg by PEG Tube route daily.    Yes Historical Provider, MD  levETIRAcetam (KEPPRA) 100 MG/ML solution Place 7.5 mLs (750 mg total) into feeding tube 2 (two) times daily. 02/08/12  Yes Monika Salk, MD  loratadine (CLARITIN) 10 MG tablet 10 mg by PEG Tube route daily.   Yes Historical Provider, MD  Menthol-Zinc Oxide (CALMOSEPTINE) 0.44-20.625 % OINT Apply topically every 12 (twelve) hours as needed (to penis for irritation).   Yes Historical Provider, MD  Multiple Vitamin (MULTIVITAMIN WITH MINERALS) TABS 1 tablet by PEG Tube route daily.   Yes Historical Provider, MD  Nutritional Supplements (FEEDING SUPPLEMENT, OSMOLITE 1.5 CAL,) LIQD 1,000 mLs by PEG Tube route continuous. 35ml/HR   Yes Historical Provider, MD  nystatin (MYCOSTATIN/NYSTOP) 100000 UNIT/GM POWD Apply  1 g topically 3 (three) times daily.   Yes Historical Provider, MD  polyethylene glycol (MIRALAX / GLYCOLAX) packet 17 g by PEG Tube route daily.    Yes Historical Provider, MD  polyvinyl alcohol (LIQUIFILM TEARS) 1.4 % ophthalmic solution Place 1 drop into both eyes 3 (three) times daily.   Yes Historical Provider, MD  traMADol (ULTRAM) 50 MG tablet 50 mg by PEG Tube route every 8 (eight) hours as needed for pain.   Yes Historical Provider, MD  Water For Irrigation, Sterile (FREE WATER) SOLN 240 mLs by PEG Tube route every 4 (four) hours.    Yes Historical Provider, MD    Allergies:  No  Known Allergies  Social History:  reports that he quit smoking about 13 years ago. His smoking use included Cigarettes. He smoked 0.00 packs per day. He has quit using smokeless tobacco. He reports that he does not drink alcohol. His drug history is not on file.  Family History: Family History  Problem Relation Age of Onset  . Asthma Mother     Physical Exam: Filed Vitals:   01/20/13 1852 01/20/13 1900 01/20/13 2016  BP: 127/81 137/90   Pulse: 112 113   Temp: 98.3 F (36.8 C)  100.7 F (38.2 C)  TempSrc: Oral  Rectal  Resp: 30 22   SpO2: 92% 96%    General appearance: flushed, mild distress and toxic tachypneic Head: Normocephalic, without obvious abnormality, atraumatic Eyes: negative Nose: Nares normal. Septum midline. Mucosa normal. No drainage or sinus tenderness. Neck: no JVD and supple, symmetrical, trachea midline Lungs: diminished breath sounds bilaterally Heart: RR tachycardic, no m/r/g Abdomen: soft, mild distended. pos bs. nonacute abd Extremities: contracted q 4 Pulses: 2+ and symmetric Skin: Skin color, texture, turgor normal. No rashes or lesions Neurologic: quadraplegic.  Does not follow any commands.  Nonverbal at baseline.    Labs on Admission:   Recent Labs  01/20/13 1959  NA 138  K 4.3  CL 97  CO2 23  GLUCOSE 108*  BUN 13  CREATININE 0.61  CALCIUM 9.7    Recent Labs  01/20/13 1959  AST 24  ALT 19  ALKPHOS 149*  BILITOT 0.3  PROT 9.1*  ALBUMIN 3.7    Recent Labs  01/20/13 1959  LIPASE 29    Recent Labs  01/20/13 1959  WBC 27.9*  NEUTROABS 26.0*  HGB 14.3  HCT 41.9  MCV 83.8  PLT 392   Radiological Exams on Admission: Dg Chest 1 View  01/20/2013   CLINICAL DATA:  Vomiting for 2 days.  EXAM: CHEST - 1 VIEW  COMPARISON:  Single view of the chest 10/28/2012.  FINDINGS: Lung volumes are low but the lungs are clear. Heart size is normal. No pneumothorax or pleural effusion.  IMPRESSION: No acute disease.   Electronically  Signed   By: Inge Rise M.D.   On: 01/20/2013 21:15   Ct Abdomen Pelvis W Contrast  01/20/2013   CLINICAL DATA:  Hematemesis.  EXAM: CT ABDOMEN AND PELVIS WITH CONTRAST  TECHNIQUE: Multidetector CT imaging of the abdomen and pelvis was performed using the standard protocol following bolus administration of intravenous contrast.  CONTRAST:  152mL OMNIPAQUE IOHEXOL 300 MG/ML  SOLN  COMPARISON:  10/28/2012  FINDINGS: BODY WALL: Unremarkable.  LOWER CHEST: Severe circumferential distal esophageal thickening. The thickening is predominant low-density, compatible with edema. Finding is new from 10/28/2012, in favor of an inflammatory process. There are scattered reticular nodular and ground-glass opacities in the lower lungs. The opacities  are could be atelectasis or low volume aspiration.  ABDOMEN/PELVIS:  Liver: No focal abnormality.  Biliary: No evidence of biliary obstruction or stone.  Pancreas: Unremarkable.  Spleen: Unremarkable.  Adrenals: Unremarkable.  Kidneys and ureters: Apparent hypoenhancement in the upper left kidney is most likely streak artifact. No hydronephrosis.  Bladder: There is a suprapubic catheter indwelling the thick walled bladder, limiting further assessment.  Reproductive: Unremarkable.  Bowel: Percutaneous gastrostomy. Gastrostomy tube is deep, with the retention balloon at the level of the duodenal bulb. The stomach is distended and fluid-filled. No inflammatory changes at the level of the duodenum. Normal appendix.  Retroperitoneum: No mass or adenopathy.  Peritoneum: No free fluid or gas.  Vascular: No acute abnormality.  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 There is related degenerative changes at the contact point between the femoral heads and posterior acetabular rims.  IMPRESSION: 1. Severe distal esophagitis, new from 10/28/2012. 2. Deep percutaneous gastrostomy tube, with retention balloon  causing relative obstruction at the level of the duodenal bulb. This can be corrected with catheter retraction and repositioning of the retention disc. 3. Possible low volume aspiration in the lower lungs.   Electronically Signed   By: Jorje Guild M.D.   On: 01/20/2013 21:38    Assessment/Plan  38 yo quadraplegic male from severe multiple sclerosis dx 12 years ago comes in with hematemesis likely from feeding tube dysfunction/causing obstruction who is also septic from significant aspiration pneumonitis  Principal Problem:   Sepsis- from lung.  Vanc/zosyn.  Blood cx pending.  ua appears relatively clean.  ivf overnight.  Place in stepdown.  Active Problems:   Multiple sclerosis- stable, advanced and devastating at baseline   Bowel obstruction - GI has been consulted for possible EGD, no perforation at this time.  Keep npo.   Aspiration pneumonia- as above   Hematemesis as above.  protonix bid.  FULL code  [place] A 01/20/2013, 10:29 PM

## 2013-01-20 NOTE — ED Notes (Signed)
CG-4 results shown to Dr. Jeneen Rinks

## 2013-01-20 NOTE — ED Provider Notes (Signed)
Patient seen and evaluated.  Care was discussed with myself and Dr. Jamse Arn.  A total soma muscle shows no distention is urinary bladder.  CT scan shows migration of the bulb from his G-tube into the duodenum potentially causing gastric outlet obstruction.  Bulb was repositioned.  He has a white blood cell count low-grade fever signs of aspiration and lactic acidosis.  He was hydrated.  Cultures were obtained.  Given broad-spectrum antibiotics.  He will be admitted.  Tanna Furry, MD 01/20/13 2248

## 2013-01-20 NOTE — ED Notes (Addendum)
EMS-pt from [facility]. Staff reports bloody emesis x approx 45 minutes. approx 600cc of bloody emesis noted on ems arrrival. Pt needing suctioning en route. HR 110 BP 150/70. Pt with hx of MS. Is nonverbal but is aware of surroundings, unable to let staff now when he is about to vomit. Pt with foley. Pt may of aspirated. Pt reports that distention to abdomen is new.

## 2013-01-20 NOTE — ED Notes (Signed)
Patient face is flushed. Per MD Turn down Vancomycin to 184ml/hr

## 2013-01-20 NOTE — ED Notes (Signed)
CT notified that patient has IV

## 2013-01-20 NOTE — ED Provider Notes (Signed)
CSN: [id number]     Arrival date & time 01/20/13  1845 History   First MD Initiated Contact with Patient 01/20/13 1901     Chief Complaint  Patient presents with  . Hematemesis   HPI  38 y/o male with history as noted below who presents from his facility for cc of vomiting. The patient has had several episodes of vomiting which began earlier this evening. Per EMS the patient had had bloody emesis for approximately 45 minutes prior to arrival. They report approximately 600 cc of bloody emesis. Vitals were stable in route other than mild tachycardia. He has associated abdominal distention. Level 5 caveat as patient is non-verbal from his MS.   Past Medical History  Diagnosis Date  . MS (multiple sclerosis)   . 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 Childhood asthma   . Depression   . Neuromuscular disorder     Quadraperesis  . Recurrent UTI   . Dysphagia   . Bladder calculi   . Neurogenic bladder   . Shortness of breath   . Recurrent upper respiratory infection (URI)   . Normocytic anemia 05/28/2011  . Aspiration pneumonia 01/20/2013   Past Surgical History  Procedure Laterality Date  . Lumbar puncture  10/12/2002  . Gastrostomy  04/16/2011    Procedure: GASTROSTOMY;  Surgeon: Zenovia Jarred, MD;  Location: [facility];  Service: General;  Laterality: N/A;  Open G-Tube placement   Family History  Problem Relation Age of Onset  . Asthma Mother    History  Substance Use Topics  . Smoking status: Former Smoker    Types: Cigarettes    Quit date: 02/02/1999  . Smokeless tobacco: Former Systems developer  . Alcohol Use: No   Review of Systems  Unable to perform ROS: Patient nonverbal   Allergies  Review of patient's allergies indicates no known allergies.  Home Medications   Current Outpatient Rx  Name  Route  Sig  Dispense  Refill  . ascorbic acid (VITAMIN C) 500 MG/5ML syrup   PEG Tube   500 mg by PEG Tube route 2 (two) times daily.         . baclofen (LIORESAL) 20 MG tablet  PEG Tube   20 mg by PEG Tube route 3 (three) times daily.         Marland Kitchen buPROPion (WELLBUTRIN) 75 MG tablet   PEG Tube   75 mg by PEG Tube route 3 (three) times daily.         . cholecalciferol (VITAMIN D) 1000 UNITS tablet   PEG Tube   1,000 Units by PEG Tube route 2 (two) times daily.          . collagenase (SANTYL) ointment   Topical   Apply 1 application topically every other day.         . Cranberry Juice Powder 425 MG CAPS   PEG Tube   425 mg by PEG Tube route 2 (two) times daily.         . Cranberry-Vitamin C-Inulin (UTI-STAT) LIQD   PEG Tube   30 mLs by PEG Tube route 2 (two) times daily. 3844m/30ml         . diazepam (VALIUM) 5 MG tablet   PEG Tube   5 mg by PEG Tube route at bedtime.          . docusate (COLACE) 50 MG/5ML liquid   PEG Tube   100 mg by PEG Tube route daily.         .Marland Kitchen  Garlic Oil (ODORLESS GARLIC) 185 MG TABS   PEG Tube   500 mg by PEG Tube route daily.          Marland Kitchen levETIRAcetam (KEPPRA) 100 MG/ML solution   Per Tube   Place 7.5 mLs (750 mg total) into feeding tube 2 (two) times daily.   473 mL   0   . loratadine (CLARITIN) 10 MG tablet   PEG Tube   10 mg by PEG Tube route daily.         . Menthol-Zinc Oxide (CALMOSEPTINE) 0.44-20.625 % OINT   Apply externally   Apply topically every 12 (twelve) hours as needed (to penis for irritation).         . Multiple Vitamin (MULTIVITAMIN WITH MINERALS) TABS   PEG Tube   1 tablet by PEG Tube route daily.         . Nutritional Supplements (FEEDING SUPPLEMENT, OSMOLITE 1.5 CAL,) LIQD   PEG Tube   1,000 mLs by PEG Tube route continuous. 74m/HR         . nystatin (MYCOSTATIN/NYSTOP) 100000 UNIT/GM POWD   Topical   Apply 1 g topically 3 (three) times daily.         . polyethylene glycol (MIRALAX / GLYCOLAX) packet   PEG Tube   17 g by PEG Tube route daily.          . polyvinyl alcohol (LIQUIFILM TEARS) 1.4 % ophthalmic solution   Both Eyes   Place 1 drop into both  eyes 3 (three) times daily.         . traMADol (ULTRAM) 50 MG tablet   PEG Tube   50 mg by PEG Tube route every 8 (eight) hours as needed for pain.         . Water For Irrigation, Sterile (FREE WATER) SOLN   PEG Tube   240 mLs by PEG Tube route every 4 (four) hours.           BP 115/76  Pulse 119  Temp(Src) 100.7 F (38.2 C) (Rectal)  Resp 14  SpO2 98% Physical Exam  Nursing note and vitals reviewed. Constitutional: He appears well-developed. He appears ill.  HENT:  Head: Normocephalic and atraumatic.  Eyes: Conjunctivae are normal. Pupils are equal, round, and reactive to light.  Neck: Neck supple.  Cardiovascular: Normal heart sounds.  Tachycardia present.  Exam reveals no gallop and no friction rub.   No murmur heard. Pulmonary/Chest: Effort normal and breath sounds normal.  Abdominal: Soft. He exhibits distension. There is no tenderness.    Musculoskeletal: He exhibits no edema.  Hypertonicity of upper and lower extremities with associated contractures.  Neurological: He is alert.  Skin: Skin is warm and dry.  Psychiatric: He has a normal mood and affect.    ED Course  Procedures (including critical care time) Labs Review Labs Reviewed  CBC WITH DIFFERENTIAL - Abnormal; Notable for the following:    WBC 27.9 (*)    Neutrophils Relative % 93 (*)    Lymphocytes Relative 3 (*)    Neutro Abs 26.0 (*)    All other components within normal limits  COMPREHENSIVE METABOLIC PANEL - Abnormal; Notable for the following:    Glucose, Bld 108 (*)    Total Protein 9.1 (*)    Alkaline Phosphatase 149 (*)    All other components within normal limits  URINALYSIS, ROUTINE W REFLEX MICROSCOPIC - Abnormal; Notable for the following:    APPearance CLOUDY (*)    pH 8.5 (*)  Leukocytes, UA SMALL (*)    All other components within normal limits  URINE MICROSCOPIC-ADD ON - Abnormal; Notable for the following:    Bacteria, UA MANY (*)    Crystals CA OXALATE CRYSTALS (*)      All other components within normal limits  CG4 I-STAT (LACTIC ACID) - Abnormal; Notable for the following:    Lactic Acid, Venous 4.00 (*)    All other components within normal limits  URINE CULTURE  CULTURE, BLOOD (ROUTINE X 2)  CULTURE, BLOOD (ROUTINE X 2)  LIPASE, BLOOD  PROTIME-INR  TYPE AND SCREEN  ABO/RH   Imaging Review Dg Chest 1 View  01/20/2013   CLINICAL DATA:  Vomiting for 2 days.  EXAM: CHEST - 1 VIEW  COMPARISON:  Single view of the chest 10/28/2012.  FINDINGS: Lung volumes are low but the lungs are clear. Heart size is normal. No pneumothorax or pleural effusion.  IMPRESSION: No acute disease.   Electronically Signed   By: Inge Rise M.D.   On: 01/20/2013 21:15   Ct Abdomen Pelvis W Contrast  01/20/2013   CLINICAL DATA:  Hematemesis.  EXAM: CT ABDOMEN AND PELVIS WITH CONTRAST  TECHNIQUE: Multidetector CT imaging of the abdomen and pelvis was performed using the standard protocol following bolus administration of intravenous contrast.  CONTRAST:  179m OMNIPAQUE IOHEXOL 300 MG/ML  SOLN  COMPARISON:  10/28/2012  FINDINGS: BODY WALL: Unremarkable.  LOWER CHEST: Severe circumferential distal esophageal thickening. The thickening is predominant low-density, compatible with edema. Finding is new from 10/28/2012, in favor of an inflammatory process. There are scattered reticular nodular and ground-glass opacities in the lower lungs. The opacities are could be atelectasis or low volume aspiration.  ABDOMEN/PELVIS:  Liver: No focal abnormality.  Biliary: No evidence of biliary obstruction or stone.  Pancreas: Unremarkable.  Spleen: Unremarkable.  Adrenals: Unremarkable.  Kidneys and ureters: Apparent hypoenhancement in the upper left kidney is most likely streak artifact. No hydronephrosis.  Bladder: There is a suprapubic catheter indwelling the thick walled bladder, limiting further assessment.  Reproductive: Unremarkable.  Bowel: Percutaneous gastrostomy. Gastrostomy tube is deep,  with the retention balloon at the level of the duodenal bulb. The stomach is distended and fluid-filled. No inflammatory changes at the level of the duodenum. Normal appendix.  Retroperitoneum: No mass or adenopathy.  Peritoneum: No free fluid or gas.  Vascular: No acute abnormality.  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 There is related degenerative changes at the contact point between the femoral heads and posterior acetabular rims.  IMPRESSION: 1. Severe distal esophagitis, new from 10/28/2012. 2. Deep percutaneous gastrostomy tube, with retention balloon causing relative obstruction at the level of the duodenal bulb. This can be corrected with catheter retraction and repositioning of the retention disc. 3. Possible low volume aspiration in the lower lungs.   Electronically Signed   By: JJorje GuildM.D.   On: 01/20/2013 21:38    EKG Interpretation   None      MDM   1. Bowel obstruction   2. Aspiration pneumonia   3. Hematemesis   4. Multiple sclerosis   5. Sepsis    Here with vomiting and "sweating". Febrile to 100.7. SIRS criteria met. Lactate of 4.0. Given IVF and broad spectrum antibiotics (vanc/zosyn) after obtaining cultures. CT obtained of abdomen given vomiting, mild distention and non-verbal state. CT revealed a relative gastric outlet obstruction secondary to the PEG tube balloon being inflated in the  duodenum. The balloon was deflated. Retracted several centimeters and re inflated and then pulled and secured against the wall of the stomach. Gastric contents were then aspirated confirming placement. CT also revealed likely aspiration pneumonia/pneumonitis. In regards to vomiting. It was likely secondary to obstruction. No vomiting while here. Protonix given secondary to reported hematemesis. Hemoglobin stable here. GI was consulted while the patient was in the ED. The patient was admitted to  internal medicine for continued workup and management.     Donita Brooks, MD 01/21/13 [phone]

## 2013-01-20 NOTE — ED Notes (Signed)
Per EDP, does not want patient to have oral contrast. CT aware.

## 2013-01-20 NOTE — Progress Notes (Signed)
ANTIBIOTIC CONSULT NOTE - INITIAL  Pharmacy Consult for vancomycin Indication: rule out sepsis  No Known Allergies  Patient Measurements:   88kg, 75 inches  Vital Signs: Temp: 100.7 F (38.2 C) (01/13 2016) Temp src: Rectal (01/13 2016) BP: 137/90 mmHg (01/13 1900) Pulse Rate: 113 (01/13 1900) Intake/Output from previous day:   Intake/Output from this shift:    Labs:  Recent Labs  01/20/13 1959  WBC 27.9*  HGB 14.3  PLT 392  CREATININE 0.61   The CrCl is unknown because both a height and weight (above a minimum accepted value) are required for this calculation. No results found for this basename: VANCOTROUGH, VANCOPEAK, VANCORANDOM, GENTTROUGH, GENTPEAK, GENTRANDOM, TOBRATROUGH, TOBRAPEAK, TOBRARND, AMIKACINPEAK, AMIKACINTROU, AMIKACIN,  in the last 72 hours   Microbiology: No results found for this or any previous visit (from the past 720 hour(s)).  Medical History: Past Medical History  Diagnosis Date  . MS (multiple sclerosis)   . 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 Childhood asthma   . Depression   . Neuromuscular disorder     Quadraperesis  . Recurrent UTI   . Dysphagia   . Bladder calculi   . Neurogenic bladder   . Shortness of breath   . Recurrent upper respiratory infection (URI)   . Normocytic anemia 05/28/2011    Medications:  See home med list Assessment: 38 year old nursing home resident with quadraparesis to start vancomycin empirically for sepsis.  Zosyn x 1 dose also ordered.  Goal of Therapy:  Vancomycin trough level 15-20 mcg/ml  Plan:  Follow up culture results vancomycin 1g IV q8h Monitor renal function.  Vancomycin trough if therapy to continue for at least 4 days or if renal function changes.  Terry Palmer 01/20/2013,9:39 PM

## 2013-01-21 DIAGNOSIS — K9420 Gastrostomy complication, unspecified: Secondary | ICD-10-CM

## 2013-01-21 LAB — COMPREHENSIVE METABOLIC PANEL
ALT: 17 U/L (ref 0–53)
AST: 18 U/L (ref 0–37)
Albumin: 3.4 g/dL — ABNORMAL LOW (ref 3.5–5.2)
Alkaline Phosphatase: 140 U/L — ABNORMAL HIGH (ref 39–117)
BILIRUBIN TOTAL: 0.4 mg/dL (ref 0.3–1.2)
BUN: 13 mg/dL (ref 6–23)
CALCIUM: 9.2 mg/dL (ref 8.4–10.5)
CO2: 21 mEq/L (ref 19–32)
Chloride: 101 mEq/L (ref 96–112)
Creatinine, Ser: 0.72 mg/dL (ref 0.50–1.35)
GFR calc Af Amer: >90 mL/min (ref >90)
GFR calc non Af Amer: >90 mL/min (ref >90)
Glucose, Bld: 78 mg/dL (ref 70–99)
Potassium: 4.2 mEq/L (ref 3.7–5.3)
Sodium: 139 mEq/L (ref 137–147)
TOTAL PROTEIN: 8 g/dL (ref 6.0–8.3)

## 2013-01-21 LAB — GLUCOSE, CAPILLARY
Glucose-Capillary: 93 mg/dL (ref 70–99)
Glucose-Capillary: 85 mg/dL (ref 70–99)
Glucose-Capillary: 79 mg/dL (ref 70–99)

## 2013-01-21 LAB — URINE CULTURE
Colony Count: NO GROWTH
Culture: NO GROWTH

## 2013-01-21 LAB — CBC WITH DIFFERENTIAL/PLATELET
BASOS PCT: 0 % (ref 0–1)
Basophils Absolute: 0 10*3/uL (ref 0.0–0.1)
EOS ABS: 0.1 10*3/uL (ref 0.0–0.7)
Eosinophils Relative: 0 % (ref 0–5)
HEMATOCRIT: 38.6 % — AB (ref 39.0–52.0)
HEMOGLOBIN: 12.8 g/dL — AB (ref 13.0–17.0)
Lymphocytes Relative: 9 % — ABNORMAL LOW (ref 12–46)
Lymphs Abs: 1.9 10*3/uL (ref 0.7–4.0)
MCH: 28.1 pg (ref 26.0–34.0)
MCHC: 33.2 g/dL (ref 30.0–36.0)
MCV: 84.6 fL (ref 78.0–100.0)
MONO ABS: 1.2 10*3/uL — AB (ref 0.1–1.0)
MONOS PCT: 6 % (ref 3–12)
Neutro Abs: 18.2 10*3/uL — ABNORMAL HIGH (ref 1.7–7.7)
Neutrophils Relative %: 85 % — ABNORMAL HIGH (ref 43–77)
Platelets: 392 10*3/uL (ref 150–400)
RBC: 4.56 MIL/uL (ref 4.22–5.81)
RDW: 14.4 % (ref 11.5–15.5)
WBC: 21.4 10*3/uL — ABNORMAL HIGH (ref 4.0–10.5)

## 2013-01-21 LAB — STREP PNEUMONIAE URINARY ANTIGEN: Strep Pneumo Urinary Antigen: NEGATIVE

## 2013-01-21 LAB — MRSA PCR SCREENING: MRSA BY PCR: NEGATIVE

## 2013-01-21 MED ORDER — JEVITY 1.2 CAL PO LIQD: 1000.0000 mL | ORAL | Status: DC

## 2013-01-21 MED ORDER — FREE WATER
240.0000 mL | Status: DC
  Administered 2013-01-21 – 2013-01-26 (×25): 240 mL

## 2013-01-21 MED ORDER — SODIUM CHLORIDE 0.9 % IV SOLN
INTRAVENOUS | Status: AC
  Administered 2013-01-21: 03:00:00 via INTRAVENOUS

## 2013-01-21 MED ORDER — ACETAMINOPHEN 650 MG RE SUPP
650.0000 mg | Freq: Four times a day (QID) | RECTAL | Status: DC | PRN
  Administered 2013-01-22: 650 mg via RECTAL
  Filled 2013-01-21 (×2): qty 1

## 2013-01-21 MED ORDER — OSMOLITE 1.5 CAL PO LIQD
1000.0000 mL | Freq: Every day | ORAL | Status: DC
  Administered 2013-01-21: 1000 mL
  Filled 2013-01-21 (×2): qty 1000

## 2013-01-21 MED ORDER — PANTOPRAZOLE SODIUM 40 MG IV SOLR
40.0000 mg | Freq: Two times a day (BID) | INTRAVENOUS | Status: DC
  Administered 2013-01-21 (×3): 40 mg via INTRAVENOUS
  Filled 2013-01-21 (×5): qty 40

## 2013-01-21 MED ORDER — COLLAGENASE 250 UNIT/GM EX OINT
TOPICAL_OINTMENT | Freq: Every day | CUTANEOUS | Status: DC
  Administered 2013-01-22: 09:00:00 via TOPICAL
  Administered 2013-01-23: 1 via TOPICAL
  Administered 2013-01-24 – 2013-01-26 (×3): via TOPICAL
  Filled 2013-01-21: qty 30

## 2013-01-21 MED ORDER — SODIUM CHLORIDE 0.9 % IV SOLN
750.0000 mg | Freq: Two times a day (BID) | INTRAVENOUS | Status: DC
  Administered 2013-01-21 – 2013-01-22 (×3): 750 mg via INTRAVENOUS
  Filled 2013-01-21 (×4): qty 7.5

## 2013-01-21 MED ORDER — PIPERACILLIN-TAZOBACTAM 3.375 G IVPB
3.3750 g | Freq: Three times a day (TID) | INTRAVENOUS | Status: DC
  Administered 2013-01-21 – 2013-01-23 (×7): 3.375 g via INTRAVENOUS
  Filled 2013-01-21 (×9): qty 50

## 2013-01-21 MED ORDER — BACLOFEN 20 MG PO TABS
20.0000 mg | ORAL_TABLET | Freq: Three times a day (TID) | ORAL | Status: DC
  Administered 2013-01-21 – 2013-01-26 (×15): 20 mg
  Filled 2013-01-21 (×20): qty 1

## 2013-01-21 MED ORDER — DIAZEPAM 5 MG PO TABS
5.0000 mg | ORAL_TABLET | Freq: Every day | ORAL | Status: DC
  Administered 2013-01-21 – 2013-01-25 (×5): 5 mg
  Filled 2013-01-21 (×5): qty 1

## 2013-01-21 MED ORDER — LEVALBUTEROL HCL 0.63 MG/3ML IN NEBU
0.6300 mg | INHALATION_SOLUTION | Freq: Four times a day (QID) | RESPIRATORY_TRACT | Status: DC | PRN
  Filled 2013-01-21: qty 3

## 2013-01-21 MED ORDER — LEVETIRACETAM 100 MG/ML PO SOLN
750.0000 mg | Freq: Two times a day (BID) | ORAL | Status: DC
  Filled 2013-01-21: qty 7.5

## 2013-01-21 MED ORDER — DIPHENHYDRAMINE HCL 50 MG/ML IJ SOLN
25.0000 mg | Freq: Four times a day (QID) | INTRAMUSCULAR | Status: DC | PRN
  Administered 2013-01-21 – 2013-01-22 (×4): 25 mg via INTRAVENOUS
  Filled 2013-01-21 (×4): qty 1

## 2013-01-21 NOTE — Consult Note (Signed)
Tube repositioned.  No other recs unless other issues come up.

## 2013-01-21 NOTE — Progress Notes (Signed)
Utilization review completed.  

## 2013-01-21 NOTE — Plan of Care (Signed)
Problem: Phase I Progression Outcomes Goal: Voiding-avoid urinary catheter unless indicated Outcome: Not Met (add Reason) Suprapubic cath

## 2013-01-21 NOTE — Progress Notes (Signed)
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 He has been vomiting on/off for 2 weeks prior to admission per father who was present at admission. Father also mentioned that his abdomen had been more bloated as well.   Past medical history:  Past Medical History  Diagnosis Date  . MS (multiple sclerosis)   . 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 Childhood asthma   . Depression   . Neuromuscular disorder     Quadraperesis  . Recurrent UTI   . Dysphagia   . Bladder calculi   . Neurogenic bladder   . Shortness of breath   . Recurrent upper respiratory infection (URI)   . Normocytic anemia 05/28/2011  . Aspiration pneumonia 01/20/2013    Consultants: [facility]  Procedures: None  Antibiotics: Vanc 1/13--> Zosyn 1/13-->  Subjective: Patient non verbal.   Objective: Vital Signs  Filed Vitals:   01/21/13 0100 01/21/13 0208 01/21/13 0411 01/21/13 0729  BP: 123/70 121/67 113/65 114/73  Pulse: 119 118 109 108  Temp:  100.2 F (37.9 C) 99.3 F (37.4 C) 98.8 F (37.1 C)  TempSrc:  Rectal Rectal Rectal  Resp: 19 15 17 17   Height:  5\' 10"  (1.778 m)    Weight:  88.3 kg (194 lb 10.7 oz)    SpO2: 95% 97% 97% 97%    Intake/Output Summary (Last 24 hours) at 01/21/13 1001 Last data filed at 01/21/13 0800  Gross per 24 hour  Intake 2057.5 ml  Output    910 ml  Net 1147.5 ml   Filed Weights   01/21/13 0208  Weight: 88.3 kg (194 lb 10.7 oz)    General appearance: no distress and eyes open. Not responding. Head: Normocephalic, without obvious abnormality, atraumatic Resp: bronchial breath sounds bilaterally with few crackles at bases. Cardio: regular rate and rhythm, S1, S2 normal, no murmur, click, rub or gallop GI: soft, non-tender; bowel sounds normal; no  masses,  no organomegaly and PEG noted. Extremities: Multiple wounds noted in LE covered with dressings. Neurologic: Severe MS with contracted extremities.  Lab Results:  Basic Metabolic Panel:  Recent Labs Lab 01/20/13 1959 01/21/13 0405  NA 138 139  K 4.3 4.2  CL 97 101  CO2 23 21  GLUCOSE 108* 78  BUN 13 13  CREATININE 0.61 0.72  CALCIUM 9.7 9.2   Liver Function Tests:  Recent Labs Lab 01/20/13 1959 01/21/13 0405  AST 24 18  ALT 19 17  ALKPHOS 149* 140*  BILITOT 0.3 0.4  PROT 9.1* 8.0  ALBUMIN 3.7 3.4*    Recent Labs Lab 01/20/13 1959  LIPASE 29   CBC:  Recent Labs Lab 01/20/13 1959 01/21/13 0405  WBC 27.9* 21.4*  NEUTROABS 26.0* 18.2*  HGB 14.3 12.8*  HCT 41.9 38.6*  MCV 83.8 84.6  PLT 392 392   CBG:  Recent Labs Lab 01/21/13 0159 01/21/13 0412  GLUCAP 93 85    Recent Results (from the past 240 hour(s))  MRSA PCR SCREENING     Status: None   Collection Time    01/21/13  3:52 AM      Result Value Range Status   MRSA by PCR NEGATIVE  NEGATIVE Final   Comment:            The GeneXpert MRSA Assay (FDA     approved  for NASAL specimens     only), is one component of a     comprehensive MRSA colonization     surveillance program. It is not     intended to diagnose MRSA     infection nor to guide or     monitor treatment for     MRSA infections.      Studies/Results: Dg Chest 1 View  01/20/2013   CLINICAL DATA:  Vomiting for 2 days.  EXAM: CHEST - 1 VIEW  COMPARISON:  Single view of the chest 10/28/2012.  FINDINGS: Lung volumes are low but the lungs are clear. Heart size is normal. No pneumothorax or pleural effusion.  IMPRESSION: No acute disease.   Electronically Signed   By: Inge Rise M.D.   On: 01/20/2013 21:15   Ct Abdomen Pelvis W Contrast  01/20/2013   CLINICAL DATA:  Hematemesis.  EXAM: CT ABDOMEN AND PELVIS WITH CONTRAST  TECHNIQUE: Multidetector CT imaging of the abdomen and pelvis was performed using the standard  protocol following bolus administration of intravenous contrast.  CONTRAST:  18mL OMNIPAQUE IOHEXOL 300 MG/ML  SOLN  COMPARISON:  10/28/2012  FINDINGS: BODY WALL: Unremarkable.  LOWER CHEST: Severe circumferential distal esophageal thickening. The thickening is predominant low-density, compatible with edema. Finding is new from 10/28/2012, in favor of an inflammatory process. There are scattered reticular nodular and ground-glass opacities in the lower lungs. The opacities are could be atelectasis or low volume aspiration.  ABDOMEN/PELVIS:  Liver: No focal abnormality.  Biliary: No evidence of biliary obstruction or stone.  Pancreas: Unremarkable.  Spleen: Unremarkable.  Adrenals: Unremarkable.  Kidneys and ureters: Apparent hypoenhancement in the upper left kidney is most likely streak artifact. No hydronephrosis.  Bladder: There is a suprapubic catheter indwelling the thick walled bladder, limiting further assessment.  Reproductive: Unremarkable.  Bowel: Percutaneous gastrostomy. Gastrostomy tube is deep, with the retention balloon at the level of the duodenal bulb. The stomach is distended and fluid-filled. No inflammatory changes at the level of the duodenum. Normal appendix.  Retroperitoneum: No mass or adenopathy.  Peritoneum: No free fluid or gas.  Vascular: No acute abnormality.  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 There is related degenerative changes at the contact point between the femoral heads and posterior acetabular rims.  IMPRESSION: 1. Severe distal esophagitis, new from 10/28/2012. 2. Deep percutaneous gastrostomy tube, with retention balloon causing relative obstruction at the level of the duodenal bulb. This can be corrected with catheter retraction and repositioning of the retention disc. 3. Possible low volume aspiration in the lower lungs.   Electronically Signed   By: Jorje Guild M.D.   On: 01/20/2013  21:38    Medications:  Scheduled: . baclofen  20 mg Per Tube TID  . diazepam  5 mg Per Tube QHS  . levETIRAcetam  750 mg Intravenous Q12H  . pantoprazole (PROTONIX) IV  40 mg Intravenous Q12H  . piperacillin-tazobactam (ZOSYN)  IV  3.375 g Intravenous Q8H  . vancomycin  1,000 mg Intravenous Q8H   Continuous: . sodium chloride 75 mL/hr at 01/21/13 0243   PIR:[id number], diphenhydrAMINE  Assessment/Plan:  Principal Problem:   Sepsis Active Problems:   Multiple sclerosis   Bowel obstruction   Aspiration pneumonia   Hematemesis    Duodenal Obstruction from PEG Balloon Repositioned in ED. Gi consult pending. Abdomen not distended today.   Hematemesis Likely due to malpositioned PEG with irritation of mucosa. Hgb stable. Continue  PPI.  Possible Aspiration PNA Continue broad spectrum coverage for now. Saturating well. Nebs might help.  Advanced MS Poor long term prognosis.   History of Seizure Disorder On IV Keppra  Code Status: Full Code. May need to readdress this issue when family is available. DVT Prophylaxis: SCD's    Family Communication: No family at bedside  Disposition Plan: Is from SNF.    LOS: 1 day   [facility] Pager [phone] 01/21/2013, 10:01 AM  If 8PM-8AM, please contact night-coverage at [email or web address], password [facility]

## 2013-01-21 NOTE — Progress Notes (Signed)
Pt has had scant blood in emesis.   Had been vomiting and abdominal bloating for last 2 weeks.   On CT the balloon terminus of surgically placed feeding tube found to be causing obstruction at duodenal bulb.  There was esophagitis noted as well  The G tube was repositioned in ED last night and abdomen is no longer distended.  Pt is not anemic.   Dietician has made recommendations re: tube feeding, free water flushes: "Recommend initiation of TF via PEG with Osmolite 1.5 at 25 ml/h, increase by 10 ml every 4 hours to goal rate of 55 ml/h with Prostat 30 ml TID to provide 2280 kcals, 128 gm protein, 1006 ml free water daily.  Once IVF discontinued, recommend resumption of home free water flushes of 240 ml q 4 hours to provide an additional 1440 ml free water daily"  Would resume tube feeds.  If recurrent emesis/hemetemesis, we will be happy to see the pt.  So please reconsult [place]. In meantime treat with PPI, and resume enteral PPI once TF are infusing without problems. If the g tube continues to cause obstruction may want to seek general surgery opinion.    Case d/w Dr Fuller Plan and Dr Curly Rim, the attending hospitalist.    Azucena Freed PA-C [phone]  .

## 2013-01-21 NOTE — Clinical Social Work Psychosocial (Signed)
Clinical Social Work Department BRIEF PSYCHOSOCIAL ASSESSMENT 01/21/2013  Patient:  Terry Palmer, Terry Palmer     Account Number:  [id number]     Admit date:  01/20/2013  Clinical Social Worker:  Lovey Newcomer  Date/Time:  01/21/2013 04:10 PM  Referred by:  Physician  Date Referred:  01/21/2013 Referred for  SNF Placement   Other Referral:   Interview type:  Family Other interview type:   CSW interviewed family at bedside. Patient is non-verbal and unable to contribute to assessment.    PSYCHOSOCIAL DATA Living Status:  FACILITY Admitted from facility:  [place] Level of care:  [place] Primary support name:  Terry Palmer and Terry Palmer Primary support relationship to patient:  SPOUSE Degree of support available:   Support is adequate, many family members at bedside.    CURRENT CONCERNS Current Concerns  Post-Acute Placement   Other Concerns:    SOCIAL WORK ASSESSMENT / PLAN CSW met with family at bedside. Family confirms that patient is from [facility] Family states that they will be taking patient home from the hospital to care for him there. Family states that they have all been working on getting the needed supplies and resources need to care for patient at home. Family seems very happy about this and they are really wanting to take patient home. CSW has notified RNCM, in case RNCM wants to follow up with family. CSW will sign off at this time.   Assessment/plan status:  No Further Intervention Required Other assessment/ plan:   NONE   Information/referral to community resources:   CSW contact information given to family.    PATIENT'S/FAMILY'S RESPONSE TO PLAN OF CARE: Family plans to take patient home at DC. Family was pleasant, appropriate, and appreciative of CSW contact. Family seems very happy about being able to take the patient home when he discharges. CSW signing off at this time.       Liz [place], [place], [place],  [phone]

## 2013-01-21 NOTE — Progress Notes (Signed)
Dore Oquin T. Mozell Haber MD FACG  

## 2013-01-21 NOTE — Consult Note (Addendum)
WOC wound consult note Reason for Consult: evaluate multiple wounds. Pt from [facility] with plans to discharge to home this week.  Mother at bedside to evaluate all wounds with [place]. Pt severely contracted on the bilateral UE and LE. Plans for patient to go home with all needed equipment and private duty RN at home.  Lives with wife and children prior to SNF placement 2 years ago.   Wound type: 1. Left heel: Stage III Pressure ulcer 3cm x 4cm x 0.2cm  2. Right medial calf: Stage II pressure ulcer 0.5cm x 0.5cm x 0.2cm  3. Left trochanter: Unstageable Pressure Ulcer-8cm x 9cm x 0.2cm  4. Right ischium: healed Stage III 5. Left ischium: Stage III Pressure Ulcer 4cm x 4cm x 0.5cm  Sacrum intact but with evidence of previous ulceration, healed now. Pressure Ulcer POA: Yes Measurement: see above Wound bed: Left heel:  Clean, pink, some early partial granulation Right medial calf: clean, pink, almost re-epithelialized Left trochanter: 100% yellow/black soft slough Left ischium: pink, moist early granulation, evidence of re-epithelization at wound edges Right ischium-clean and pink, not opened re-epithelialized high risk for reopening  Drainage (amount, consistency, odor)  Minimal drainage at any site, only site with moderate, foul drainage is the left ischium which is from the necrotic tissue Periwound: intact Dressing procedure/placement/frequency: Air mattress overlay for pressure redistribution ordered, awaiting arrival to unit.  Silicone foam to protect, insulate left and right heels, right inner calf, sacrum, left and right ischium.  Will add enzymatic debridement ointment to the left trochanter for debridement and begin PT for hydrotherapy starting tom, as I do not think this patient is a surgical candidate for debridement at this time.  Optimize nutrition, dietician for protein needs for wound healing.  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  [place] notified PT department of need for hydrotherapy to start tom.   WOC will follow along with you for wound care and progress. Fatiha Guzy [facility] RN,CWOCN

## 2013-01-21 NOTE — Progress Notes (Signed)
INITIAL NUTRITION ASSESSMENT  DOCUMENTATION CODES Per approved criteria  -Not Applicable   INTERVENTION: Once PEG tube is intact and ready to use, recommend initiation of TF via PEG with Osmolite 1.5 at 25 ml/h, increase by 10 ml every 4 hours to goal rate of 55 ml/h with Prostat 30 ml TID to provide 2280 kcals, 128 gm protein, 1006 ml free water daily. Once IVF discontinued, recommend resumption of home free water flushes of 240 ml q 4 hours to provide an additional 1440 ml free water daily. RD to continue to follow nutrition care plan.  NUTRITION DIAGNOSIS: Inadequate oral intake related to inability to eat as evidenced by NPO status.   Goal: Intake to meet >90% of estimated nutrition needs.  Monitor:  weight trends, lab trends, I/O's, initiation of enteral nutrition  Reason for Assessment: Low Braden Score  38 y.o. male  Admitting Dx: Sepsis  ASSESSMENT: PMHx significant for severe MS, quadriplegia, FTT, frequent infections, nonverbal. Admitted with vomiting on/off x 2 weeks, fever, bloated abdomen. Work-up reveals obstruction of feeding tube.  Pt's PEG balloon was repositioned in ED, pt had duodenal obstruction. GI consult is currently pending.  Per MAR, pt receives Osmolite 1/5 at 75 ml/hr continuously via PEG with 240 ml free water flushes per tube q 4 hours. This provides: 2700 kcal, 113 grams protein, 1371 ml free water from formula + 1440 ml free water from flushes (2811 ml free water total.)  No family at bedside. Pt's weight has been relatively stable x 1 year.  Nutrition Focused Physical Exam (difficult to assess due to contractures):   Subcutaneous Fat:  Orbital Region: WNL  Upper Arm Region: WNL  Thoracic and Lumbar Region: NA   Muscle:  Temple Region: WNL  Clavicle Bone Region: WNL  Clavicle and Acromion Bone Region: WNL  Scapular Bone Region: NA  Dorsal Hand: NA  Patellar Region: NA  Anterior Thigh Region: NA  Posterior Calf Region: NA   Edema:  none  Height: Ht Readings from Last 1 Encounters:  01/21/13 5\' 10"  (1.778 m)    Weight: Wt Readings from Last 1 Encounters:  01/21/13 194 lb 10.7 oz (88.3 kg)    Ideal Body Weight: 149 lb (adjusted for quadriplegia)  % Ideal Body Weight: 130%  Wt Readings from Last 10 Encounters:  01/21/13 194 lb 10.7 oz (88.3 kg)  12/15/12 195 lb 8.8 oz (88.7 kg)  01/29/12 183 lb 3.2 oz (83.1 kg)  05/31/11 145 lb 15.1 oz (66.2 kg)  04/23/11 166 lb (75.297 kg)  04/18/11 160 lb 15 oz (73 kg)  04/18/11 160 lb 15 oz (73 kg)  03/26/11 186 lb 15.2 oz (84.8 kg)  03/26/11 186 lb 15.2 oz (84.8 kg)  02/01/11 155 lb 13.8 oz (70.7 kg)    Usual Body Weight: 183 lb (1 year ago)   % Usual Body Weight: 106%  BMI:  Body mass index is 27.93 kg/(m^2). Overweight  Estimated Nutritional Needs: Kcal: 2050 - 2250 Protein: 115 - 130 g Fluid: > 2.3 liters daily  Skin:  Stage II to R buttocks Stage III to L buttocks Stage III to L heel Unstageable to L hip  Diet Order: NPO  EDUCATION NEEDS: -No education needs identified at this time   Intake/Output Summary (Last 24 hours) at 01/21/13 1212 Last data filed at 01/21/13 1100  Gross per 24 hour  Intake 2057.5 ml  Output   1035 ml  Net 1022.5 ml    Last BM: PTA  Labs:   Recent  Labs Lab 01/20/13 1959 01/21/13 0405  NA 138 139  K 4.3 4.2  CL 97 101  CO2 23 21  BUN 13 13  CREATININE 0.61 0.72  CALCIUM 9.7 9.2  GLUCOSE 108* 78    CBG (last 3)   Recent Labs  01/21/13 0159 01/21/13 0412  GLUCAP 93 85    Scheduled Meds: . baclofen  20 mg Per Tube TID  . diazepam  5 mg Per Tube QHS  . levETIRAcetam  750 mg Intravenous Q12H  . pantoprazole (PROTONIX) IV  40 mg Intravenous Q12H  . piperacillin-tazobactam (ZOSYN)  IV  3.375 g Intravenous Q8H  . vancomycin  1,000 mg Intravenous Q8H    Continuous Infusions: . sodium chloride 75 mL/hr at 01/21/13 0243    Past Medical History  Diagnosis Date  . MS (multiple sclerosis)   .  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 Childhood asthma   . Depression   . Neuromuscular disorder     Quadraperesis  . Recurrent UTI   . Dysphagia   . Bladder calculi   . Neurogenic bladder   . Shortness of breath   . Recurrent upper respiratory infection (URI)   . Normocytic anemia 05/28/2011  . Aspiration pneumonia 01/20/2013    Past Surgical History  Procedure Laterality Date  . Lumbar puncture  10/12/2002  . Gastrostomy  04/16/2011    Procedure: GASTROSTOMY;  Surgeon: Zenovia Jarred, MD;  Location: [facility] OR;  Service: General;  Laterality: N/A;  Open G-Tube placement    Inda Coke MS, RD, LDN Pager: [phone] After-hours pager: [phone]

## 2013-01-21 NOTE — Progress Notes (Signed)
Patient's mother stated that she wanted the patient to continue to be a full code.   01/21/2013 4:28 PM Terry Palmer

## 2013-01-21 NOTE — Consult Note (Signed)
PIUS BYROM August 24, 1975  [id number].    Requesting MD: Dr. Maryland Pink Chief Complaint/Reason for Consult: mispositioned G tube causing duodenal obstruction  HPI:  HPI had to be obtained from the medical chart as the patient has progressive MS and not able to give me a history.  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  N/V on/off for 2 weeks with abdominal bloating.  He was sent from SNF for SOB and tachycardia.  The CT scan shows obstruction of the balloon portion of the G tube in the duodenum.  Dr. Grandville Silos did an open G tube in April 2013 secondary to an unsuccessful PEG placement due to insufficient illumination.  We were asked to re-position the G-tube  ROS: ROS not able to be obtained due to the patient being non-verbal except for as above.  Family History  Problem Relation Age of Onset  . Asthma Mother     Past Medical History  Diagnosis Date  . MS (multiple sclerosis)   . 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 Childhood asthma   . Depression   . Neuromuscular disorder     Quadraperesis  . Recurrent UTI   . Dysphagia   . Bladder calculi   . Neurogenic bladder   . Shortness of breath   . Recurrent upper respiratory infection (URI)   . Normocytic anemia 05/28/2011  . Aspiration pneumonia 01/20/2013    Past Surgical History  Procedure Laterality Date  . Lumbar puncture  10/12/2002  . Gastrostomy  04/16/2011    Procedure: GASTROSTOMY;  Surgeon: Zenovia Jarred, MD;  Location: [facility];  Service: General;  Laterality: N/A;  Open G-Tube placement    Social History:  reports that he quit smoking about 13 years ago. His smoking use included Cigarettes. He smoked 0.00 packs per day. He has quit using smokeless tobacco. He reports that he does not drink alcohol. His drug history is not on file.  Allergies: No Known Allergies  Medications Prior to Admission  Medication Sig Dispense  Refill  . ascorbic acid (VITAMIN C) 500 MG/5ML syrup 500 mg by PEG Tube route 2 (two) times daily.      . baclofen (LIORESAL) 20 MG tablet 20 mg by PEG Tube route 3 (three) times daily.      Marland Kitchen buPROPion (WELLBUTRIN) 75 MG tablet 75 mg by PEG Tube route 3 (three) times daily.      . cholecalciferol (VITAMIN D) 1000 UNITS tablet 1,000 Units by PEG Tube route 2 (two) times daily.       . collagenase (SANTYL) ointment Apply 1 application topically every other day.      . Cranberry Juice Powder 425 MG CAPS 425 mg by PEG Tube route 2 (two) times daily.      . Cranberry-Vitamin C-Inulin (UTI-STAT) LIQD 30 mLs by PEG Tube route 2 (two) times daily. 384m/30ml      . diazepam (VALIUM) 5 MG tablet 5 mg by PEG Tube route at bedtime.       . docusate (COLACE) 50 MG/5ML liquid 100 mg by PEG Tube route daily.      . Garlic Oil (ODORLESS GARLIC) 5416MG TABS 5606mg by PEG Tube route daily.       .Marland KitchenlevETIRAcetam (KEPPRA) 100 MG/ML solution Place 7.5 mLs (750 mg total) into feeding tube 2 (two) times daily.  473 mL  0  . loratadine (CLARITIN) 10 MG tablet 10 mg by PEG Tube route  daily.      . Menthol-Zinc Oxide (CALMOSEPTINE) 0.44-20.625 % OINT Apply topically every 12 (twelve) hours as needed (to penis for irritation).      . Multiple Vitamin (MULTIVITAMIN WITH MINERALS) TABS 1 tablet by PEG Tube route daily.      . Nutritional Supplements (FEEDING SUPPLEMENT, OSMOLITE 1.5 CAL,) LIQD 1,000 mLs by PEG Tube route continuous. 18m/HR      . nystatin (MYCOSTATIN/NYSTOP) 100000 UNIT/GM POWD Apply 1 g topically 3 (three) times daily.      . polyethylene glycol (MIRALAX / GLYCOLAX) packet 17 g by PEG Tube route daily.       . polyvinyl alcohol (LIQUIFILM TEARS) 1.4 % ophthalmic solution Place 1 drop into both eyes 3 (three) times daily.      . traMADol (ULTRAM) 50 MG tablet 50 mg by PEG Tube route every 8 (eight) hours as needed for pain.      . Water For Irrigation, Sterile (FREE WATER) SOLN 240 mLs by PEG Tube route  every 4 (four) hours.         Blood pressure 108/66, pulse 106, temperature 98.8 F (37.1 C), temperature source Axillary, resp. rate 18, height 5' 10" (1.778 m), weight 194 lb 10.7 oz (88.3 kg), SpO2 98.00%. Physical Exam: General: non-verbal, WD, thin male who is laying in bed in NAD HEENT: head is normocephalic, atraumatic.  Sclera are noninjected.  PERRL.  Ears and nose without any masses or lesions.  Mouth is pink and moist Heart: regular, rate, and rhythm.   Lungs: Respiratory effort nonlabored Abd: soft, NT/ND, +BS, G tube in LUQ with flange which appears loose, after reposition the G tube seems more secure MS: contractures of extremities, no visible lesions/rash Skin: warm and dry with no masses, lesions, or rashes Psych: A&Ox3 with an appropriate affect.  Results for orders placed during the hospital encounter of 01/20/13 (from the past 48 hour(s))  CBC WITH DIFFERENTIAL     Status: Abnormal   Collection Time    01/20/13  7:59 PM      Result Value Range   WBC 27.9 (*) 4.0 - 10.5 K/uL   RBC 5.00  4.22 - 5.81 MIL/uL   Hemoglobin 14.3  13.0 - 17.0 g/dL   HCT 41.9  39.0 - 52.0 %   MCV 83.8  78.0 - 100.0 fL   MCH 28.6  26.0 - 34.0 pg   MCHC 34.1  30.0 - 36.0 g/dL   RDW 14.3  11.5 - 15.5 %   Platelets 392  150 - 400 K/uL   Neutrophils Relative % 93 (*) 43 - 77 %   Lymphocytes Relative 3 (*) 12 - 46 %   Monocytes Relative 3  3 - 12 %   Eosinophils Relative 1  0 - 5 %   Basophils Relative 0  0 - 1 %   Neutro Abs 26.0 (*) 1.7 - 7.7 K/uL   Lymphs Abs 0.8  0.7 - 4.0 K/uL   Monocytes Absolute 0.8  0.1 - 1.0 K/uL   Eosinophils Absolute 0.3  0.0 - 0.7 K/uL   Basophils Absolute 0.0  0.0 - 0.1 K/uL   WBC Morphology VACUOLATED NEUTROPHILS     Comment: WHITE COUNT CONFIRMED ON SMEAR  COMPREHENSIVE METABOLIC PANEL     Status: Abnormal   Collection Time    01/20/13  7:59 PM      Result Value Range   Sodium 138  137 - 147 mEq/L   Potassium 4.3  3.7 - 5.3  mEq/L   Chloride 97  96 -  112 mEq/L   CO2 23  19 - 32 mEq/L   Glucose, Bld 108 (*) 70 - 99 mg/dL   BUN 13  6 - 23 mg/dL   Creatinine, Ser 0.61  0.50 - 1.35 mg/dL   Calcium 9.7  8.4 - 10.5 mg/dL   Total Protein 9.1 (*) 6.0 - 8.3 g/dL   Albumin 3.7  3.5 - 5.2 g/dL   AST 24  0 - 37 U/L   ALT 19  0 - 53 U/L   Alkaline Phosphatase 149 (*) 39 - 117 U/L   Total Bilirubin 0.3  0.3 - 1.2 mg/dL   GFR calc non Af Amer >90  >90 mL/min   GFR calc Af Amer >90  >90 mL/min   Comment: (NOTE)     The eGFR has been calculated using the CKD EPI equation.     This calculation has not been validated in all clinical situations.     eGFR's persistently <90 mL/min signify possible Chronic Kidney     Disease.  LIPASE, BLOOD     Status: None   Collection Time    01/20/13  7:59 PM      Result Value Range   Lipase 29  11 - 59 U/L  URINALYSIS, ROUTINE W REFLEX MICROSCOPIC     Status: Abnormal   Collection Time    01/20/13  7:59 PM      Result Value Range   Color, Urine YELLOW  YELLOW   APPearance CLOUDY (*) CLEAR   Specific Gravity, Urine 1.018  1.005 - 1.030   pH 8.5 (*) 5.0 - 8.0   Glucose, UA NEGATIVE  NEGATIVE mg/dL   Hgb urine dipstick NEGATIVE  NEGATIVE   Bilirubin Urine NEGATIVE  NEGATIVE   Ketones, ur NEGATIVE  NEGATIVE mg/dL   Protein, ur NEGATIVE  NEGATIVE mg/dL   Urobilinogen, UA 0.2  0.0 - 1.0 mg/dL   Nitrite NEGATIVE  NEGATIVE   Leukocytes, UA SMALL (*) NEGATIVE  PROTIME-INR     Status: None   Collection Time    01/20/13  7:59 PM      Result Value Range   Prothrombin Time 12.6  11.6 - 15.2 seconds   INR 0.96  0.00 - 1.49  URINE MICROSCOPIC-ADD ON     Status: Abnormal   Collection Time    01/20/13  7:59 PM      Result Value Range   Squamous Epithelial / LPF RARE  RARE   WBC, UA 3-6  <3 WBC/hpf   RBC / HPF 0-2  <3 RBC/hpf   Bacteria, UA MANY (*) RARE   Crystals CA OXALATE CRYSTALS (*) NEGATIVE  CG4 I-STAT (LACTIC ACID)     Status: Abnormal   Collection Time    01/20/13  8:25 PM      Result Value Range    Lactic Acid, Venous 4.00 (*) 0.5 - 2.2 mmol/L  TYPE AND SCREEN     Status: None   Collection Time    01/20/13  8:27 PM      Result Value Range   ABO/RH(D) A POS     Antibody Screen NEG     Sample Expiration 01/23/2013    ABO/RH     Status: None   Collection Time    01/20/13  8:27 PM      Result Value Range   ABO/RH(D) A POS    GLUCOSE, CAPILLARY     Status: None   Collection Time  01/21/13  1:59 AM      Result Value Range   Glucose-Capillary 93  70 - 99 mg/dL   Comment 1 Notify RN     Comment 2 Documented in Chart    MRSA PCR SCREENING     Status: None   Collection Time    01/21/13  3:52 AM      Result Value Range   MRSA by PCR NEGATIVE  NEGATIVE   Comment:            The GeneXpert MRSA Assay (FDA     approved for NASAL specimens     only), is one component of a     comprehensive MRSA colonization     surveillance program. It is not     intended to diagnose MRSA     infection nor to guide or     monitor treatment for     MRSA infections.  CBC WITH DIFFERENTIAL     Status: Abnormal   Collection Time    01/21/13  4:05 AM      Result Value Range   WBC 21.4 (*) 4.0 - 10.5 K/uL   RBC 4.56  4.22 - 5.81 MIL/uL   Hemoglobin 12.8 (*) 13.0 - 17.0 g/dL   HCT 38.6 (*) 39.0 - 52.0 %   MCV 84.6  78.0 - 100.0 fL   MCH 28.1  26.0 - 34.0 pg   MCHC 33.2  30.0 - 36.0 g/dL   RDW 14.4  11.5 - 15.5 %   Platelets 392  150 - 400 K/uL   Neutrophils Relative % 85 (*) 43 - 77 %   Neutro Abs 18.2 (*) 1.7 - 7.7 K/uL   Lymphocytes Relative 9 (*) 12 - 46 %   Lymphs Abs 1.9  0.7 - 4.0 K/uL   Monocytes Relative 6  3 - 12 %   Monocytes Absolute 1.2 (*) 0.1 - 1.0 K/uL   Eosinophils Relative 0  0 - 5 %   Eosinophils Absolute 0.1  0.0 - 0.7 K/uL   Basophils Relative 0  0 - 1 %   Basophils Absolute 0.0  0.0 - 0.1 K/uL  COMPREHENSIVE METABOLIC PANEL     Status: Abnormal   Collection Time    01/21/13  4:05 AM      Result Value Range   Sodium 139  137 - 147 mEq/L   Potassium 4.2  3.7 - 5.3  mEq/L   Chloride 101  96 - 112 mEq/L   CO2 21  19 - 32 mEq/L   Glucose, Bld 78  70 - 99 mg/dL   BUN 13  6 - 23 mg/dL   Creatinine, Ser 0.72  0.50 - 1.35 mg/dL   Calcium 9.2  8.4 - 10.5 mg/dL   Total Protein 8.0  6.0 - 8.3 g/dL   Albumin 3.4 (*) 3.5 - 5.2 g/dL   AST 18  0 - 37 U/L   ALT 17  0 - 53 U/L   Alkaline Phosphatase 140 (*) 39 - 117 U/L   Total Bilirubin 0.4  0.3 - 1.2 mg/dL   GFR calc non Af Amer >90  >90 mL/min   GFR calc Af Amer >90  >90 mL/min   Comment: (NOTE)     The eGFR has been calculated using the CKD EPI equation.     This calculation has not been validated in all clinical situations.     eGFR's persistently <90 mL/min signify possible Chronic Kidney     Disease.  GLUCOSE, CAPILLARY     Status: None   Collection Time    01/21/13  4:12 AM      Result Value Range   Glucose-Capillary 85  70 - 99 mg/dL   Comment 1 Notify RN     Comment 2 Documented in Chart    STREP PNEUMONIAE URINARY ANTIGEN     Status: None   Collection Time    01/21/13  5:00 AM      Result Value Range   Strep Pneumo Urinary Antigen NEGATIVE  NEGATIVE   Comment:            Infection due to S. pneumoniae     cannot be absolutely ruled out     since the antigen present     may be below the detection limit     of the test.   Dg Chest 1 View  01/20/2013   CLINICAL DATA:  Vomiting for 2 days.  EXAM: CHEST - 1 VIEW  COMPARISON:  Single view of the chest 10/28/2012.  FINDINGS: Lung volumes are low but the lungs are clear. Heart size is normal. No pneumothorax or pleural effusion.  IMPRESSION: No acute disease.   Electronically Signed   By: Inge Rise M.D.   On: 01/20/2013 21:15   Ct Abdomen Pelvis W Contrast  01/20/2013   CLINICAL DATA:  Hematemesis.  EXAM: CT ABDOMEN AND PELVIS WITH CONTRAST  TECHNIQUE: Multidetector CT imaging of the abdomen and pelvis was performed using the standard protocol following bolus administration of intravenous contrast.  CONTRAST:  178m OMNIPAQUE IOHEXOL 300  MG/ML  SOLN  COMPARISON:  10/28/2012  FINDINGS: BODY WALL: Unremarkable.  LOWER CHEST: Severe circumferential distal esophageal thickening. The thickening is predominant low-density, compatible with edema. Finding is new from 10/28/2012, in favor of an inflammatory process. There are scattered reticular nodular and ground-glass opacities in the lower lungs. The opacities are could be atelectasis or low volume aspiration.  ABDOMEN/PELVIS:  Liver: No focal abnormality.  Biliary: No evidence of biliary obstruction or stone.  Pancreas: Unremarkable.  Spleen: Unremarkable.  Adrenals: Unremarkable.  Kidneys and ureters: Apparent hypoenhancement in the upper left kidney is most likely streak artifact. No hydronephrosis.  Bladder: There is a suprapubic catheter indwelling the thick walled bladder, limiting further assessment.  Reproductive: Unremarkable.  Bowel: Percutaneous gastrostomy. Gastrostomy tube is deep, with the retention balloon at the level of the duodenal bulb. The stomach is distended and fluid-filled. No inflammatory changes at the level of the duodenum. Normal appendix.  Retroperitoneum: No mass or adenopathy.  Peritoneum: No free fluid or gas.  Vascular: No acute abnormality.  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 There is related degenerative changes at the contact point between the femoral heads and posterior acetabular rims.  IMPRESSION: 1. Severe distal esophagitis, new from 10/28/2012. 2. Deep percutaneous gastrostomy tube, with retention balloon causing relative obstruction at the level of the duodenal bulb. This can be corrected with catheter retraction and repositioning of the retention disc. 3. Possible low volume aspiration in the lower lungs.   Electronically Signed   By: JJorje GuildM.D.   On: 01/20/2013 21:38       Assessment/Plan S/p G tube placement  Progressive MS PCM with TF  Plan: 1.   Deflated the balloon with syringe, distracted the tube and was able to advance it 2cm.  Benzoin applied to the tube at the skin to prevent sliding of flange.  Tape applied to the distal aspect of the tube to prevent the flange from sliding as well. 2.  The G tube can be used for meds and TF's now, may need to trickle TF first to ensure he can tolerate it. 3.  No further recommendations   DORT,  01/21/2013, 3:57 PM Pager: [phone]

## 2013-01-22 DIAGNOSIS — E46 Unspecified protein-calorie malnutrition: Secondary | ICD-10-CM

## 2013-01-22 LAB — CBC
HCT: 34.6 % — ABNORMAL LOW (ref 39.0–52.0)
Hemoglobin: 11.4 g/dL — ABNORMAL LOW (ref 13.0–17.0)
MCH: 28.8 pg (ref 26.0–34.0)
MCHC: 32.9 g/dL (ref 30.0–36.0)
MCV: 87.4 fL (ref 78.0–100.0)
Platelets: 346 10*3/uL (ref 150–400)
RBC: 3.96 MIL/uL — AB (ref 4.22–5.81)
RDW: 14.7 % (ref 11.5–15.5)
WBC: 16.2 10*3/uL — ABNORMAL HIGH (ref 4.0–10.5)

## 2013-01-22 LAB — GLUCOSE, CAPILLARY
GLUCOSE-CAPILLARY: 96 mg/dL (ref 70–99)
GLUCOSE-CAPILLARY: 90 mg/dL (ref 70–99)
Glucose-Capillary: 96 mg/dL (ref 70–99)
Glucose-Capillary: 95 mg/dL (ref 70–99)
Glucose-Capillary: 87 mg/dL (ref 70–99)

## 2013-01-22 LAB — BASIC METABOLIC PANEL
BUN: 11 mg/dL (ref 6–23)
CALCIUM: 8.7 mg/dL (ref 8.4–10.5)
CO2: 23 meq/L (ref 19–32)
Chloride: 101 mEq/L (ref 96–112)
Creatinine, Ser: 0.82 mg/dL (ref 0.50–1.35)
GFR calc Af Amer: >90 mL/min (ref >90)
GLUCOSE: 80 mg/dL (ref 70–99)
Potassium: 4.4 mEq/L (ref 3.7–5.3)
Sodium: 139 mEq/L (ref 137–147)

## 2013-01-22 LAB — LEGIONELLA ANTIGEN, URINE: Legionella Antigen, Urine: NEGATIVE

## 2013-01-22 MED ORDER — ADULT MULTIVITAMIN LIQUID CH
5.0000 mL | Freq: Every day | ORAL | Status: DC
  Administered 2013-01-22 – 2013-01-26 (×5): 5 mL
  Filled 2013-01-22 (×5): qty 5

## 2013-01-22 MED ORDER — VITAMIN C 500 MG/5ML PO SYRP
500.0000 mg | ORAL_SOLUTION | Freq: Two times a day (BID) | ORAL | Status: DC
  Administered 2013-01-22 – 2013-01-23 (×4): 500 mg
  Filled 2013-01-22 (×5): qty 5

## 2013-01-22 MED ORDER — PRO-STAT SUGAR FREE PO LIQD
30.0000 mL | Freq: Three times a day (TID) | ORAL | Status: DC
  Administered 2013-01-22 – 2013-01-26 (×14): 30 mL
  Filled 2013-01-22 (×18): qty 30

## 2013-01-22 MED ORDER — BUPROPION HCL 75 MG PO TABS
75.0000 mg | ORAL_TABLET | Freq: Three times a day (TID) | ORAL | Status: DC
  Administered 2013-01-22 – 2013-01-26 (×14): 75 mg via ORAL
  Filled 2013-01-22 (×17): qty 1

## 2013-01-22 MED ORDER — LEVETIRACETAM 100 MG/ML PO SOLN
750.0000 mg | Freq: Two times a day (BID) | ORAL | Status: DC
  Administered 2013-01-22 – 2013-01-26 (×9): 750 mg
  Filled 2013-01-22 (×13): qty 7.5

## 2013-01-22 MED ORDER — DOCUSATE SODIUM 50 MG/5ML PO LIQD
100.0000 mg | Freq: Every day | ORAL | Status: DC
  Administered 2013-01-22 – 2013-01-26 (×5): 100 mg
  Filled 2013-01-22 (×5): qty 10

## 2013-01-22 MED ORDER — PANTOPRAZOLE SODIUM 40 MG PO PACK
40.0000 mg | PACK | Freq: Two times a day (BID) | ORAL | Status: DC
  Administered 2013-01-22 – 2013-01-26 (×9): 40 mg
  Filled 2013-01-22 (×12): qty 20

## 2013-01-22 MED ORDER — OSMOLITE 1.5 CAL PO LIQD
1000.0000 mL | Freq: Every day | ORAL | Status: DC
  Administered 2013-01-22: 1000 mL
  Filled 2013-01-22 (×2): qty 1000

## 2013-01-22 MED ORDER — POLYETHYLENE GLYCOL 3350 17 G PO PACK
17.0000 g | PACK | Freq: Every day | ORAL | Status: DC | PRN
  Administered 2013-01-22: 17 g via ORAL
  Filled 2013-01-22: qty 1

## 2013-01-22 NOTE — Progress Notes (Signed)
ANTIBIOTIC CONSULT NOTE   Pharmacy Consult for Vancomycin / Zosyn Indication: rule out sepsis  No Known Allergies  Labs:  Recent Labs  01/20/13 1959 01/21/13 0405 01/22/13 0241  WBC 27.9* 21.4* 16.2*  HGB 14.3 12.8* 11.4*  PLT 392 392 346  CREATININE 0.61 0.72 0.82    Microbiology: Recent Results (from the past 720 hour(s))  URINE CULTURE     Status: None   Collection Time    01/20/13  7:59 PM      Result Value Range Status   Specimen Description URINE, CATHETERIZED   Final   Special Requests NONE   Final   Culture  Setup Time     Final   Value: 01/21/2013 00:59     Performed at [facility]     Final   Value: NO GROWTH     Performed at [facility]   Culture     Final   Value: NO GROWTH     Performed at [facility]   Report Status 01/21/2013 FINAL   Final  CULTURE, BLOOD (ROUTINE X 2)     Status: None   Collection Time    01/20/13  9:47 PM      Result Value Range Status   Specimen Description BLOOD RIGHT HAND   Final   Special Requests BOTTLES DRAWN AEROBIC ONLY 1ML   Final   Culture  Setup Time     Final   Value: 01/21/2013 02:14     Performed at [facility]   Culture     Final   Value:        BLOOD CULTURE RECEIVED NO GROWTH TO DATE CULTURE WILL BE HELD FOR 5 DAYS BEFORE ISSUING A FINAL NEGATIVE REPORT     Performed at [facility]   Report Status PENDING   Incomplete  CULTURE, BLOOD (ROUTINE X 2)     Status: None   Collection Time    01/20/13  9:54 PM      Result Value Range Status   Specimen Description BLOOD BLOOD RIGHT FOREARM   Final   Special Requests BOTTLES DRAWN AEROBIC ONLY 3CC   Final   Culture  Setup Time     Final   Value: 01/21/2013 02:14     Performed at [facility]   Culture     Final   Value:        BLOOD CULTURE RECEIVED NO GROWTH TO DATE CULTURE WILL BE HELD FOR 5 DAYS BEFORE ISSUING A FINAL NEGATIVE REPORT     Performed at [facility]   Report Status PENDING    Incomplete  MRSA PCR SCREENING     Status: None   Collection Time    01/21/13  3:52 AM      Result Value Range Status   MRSA by PCR NEGATIVE  NEGATIVE Final   Comment:            The GeneXpert MRSA Assay (FDA     approved for NASAL specimens     only), is one component of a     comprehensive MRSA colonization     surveillance program. It is not     intended to diagnose MRSA     infection nor to guide or     monitor treatment for     MRSA infections.    Assessment: 38 year old nursing home resident with quadraparesis continues on vancomycin and Zosyn empirically for sepsis. Cultures  negative to date.  Tmax = 99.3, WBC trending down  Goal of Therapy:  Vancomycin trough level 15-20 mcg/ml  Plan:  1) Continue vancomycin 1 gram iv Q 8 hours 2) Continue Zosyn 3.375 grams iv Q 8 hours - 4 hr infusion 3) Vancomycin trough level if to continue > 4 days  Thank you. Anette Guarneri, PharmD [phone]  01/22/2013,10:10 AM

## 2013-01-22 NOTE — Care Management Note (Signed)
Page 1 of 2   01/26/2013     8:50:59 AM   CARE MANAGEMENT NOTE 01/26/2013  Patient:  Terry Palmer, Terry Palmer   Account Number:  [id number]  Date Initiated:  01/21/2013  Documentation initiated by:  [name]  Subjective/Objective Assessment:   Pt admitted with sepsis     Action/Plan:   PTA pt lived at [facility] consutled for return to SNF when medically stable   Anticipated DC Date:  01/26/2013   Anticipated DC Plan:  HOME/SELF CARE  In-house referral  Clinical Social Worker      DC Planning Services  CM consult      Choice offered to / List presented to:        DME agency  [facility]        Status of service:  In process, will continue to follow Medicare Important Message given?   (If response is "NO", the following Medicare IM given date fields will be blank) Date Medicare IM given:   Date Additional Medicare IM given:    Discharge Disposition:    Per UR Regulation:  Reviewed for med. necessity/level of care/duration of stay  If discussed at [place] of Stay Meetings, dates discussed:    Comments:  wife- [name]- home [phone] cell- [phone] mother- [name]- [phone]  01-26-13 Faxed orders and clinical  for tube feeding and tube feeding pump to [facility] spoke to [place] phone [phone] fax [phone].  Called [name] spoke to [name] [address] , fax [phone] fax order for suction.   Spoke to [employer] at [place] . Updated on above . Faxed [name] current medication list and orders for private duty nursing .  [name] will need discharge summary faxed to her when completed , and called when transport set up for patient to discharge home.  All aware patient to discharge to home today .   [name] RN BSN [phone]   01/23/13- [place] RN, BSN [phone] Was able to speak with [name] who is with [employer] via [place]- regarding d/c plans- per conversation she states that  she has been working with family to arrange care at home with private duty nursing- pt has been approved for services to begin on Monday 01/26/13- DME has not been delivered as of yet- they had to change companies to [place] as pt has [employer] and only contracts through [place] - she did confirm that pt will be followed by [name] at home and will have all needed equipment- They only thing that will be needed for discharge- is an order from MD here to start private duty nursing as part of his discharge orders and paperwork needs to include any treatments needed by the home nurses including meds, TF and any drsg or wound care. Pt will need ambulance transportation home. His private duty care will be provided through [facility].    01/22/13- 1215- [name] RN, BSN [phone] Referral received regarding pt's family wanting to take pt home instead of returning to SNF- in to speak with pt's mom- [name] at bedside- confirmed with her that pt's wife [name] and her had started planning to take pt home while he was still at [facility]- plans where under way for pt to go home this week after DME was in place. Per mom- DME has been ordered and they are just waiting on delivery through [facility]. Mom and wife [name] have been  working with a [name] (contact # [phone] or cell [phone]) to arrange everything for bringing pt home. Per mom this [name] has been arranging DME and the private duty nursing and aides for home- mom also states that pt will be followed by [name] at home. Pt will need ambulance transportation home when ready for discharge. This [name] is to come by pt's room later today around 4 pm- per mom- this CM will be available to speak with her in order to confirm arrangements that have been made and see if there is anything that needs to be done on this end prior to pt's discharge- if she does not come by this afternoon then this CM will try the contact numbers tomorrow to confirm things.

## 2013-01-22 NOTE — Progress Notes (Signed)
Physical Therapy Wound Treatment Patient Details  Name: Terry Palmer MRN: [id number] Date of Birth: June 28, 1975  Today's Date: 01/22/2013 Time: 0900-0924 Time Calculation (min): 24 min  Subjective  Subjective: Nonverbal Patient and Family Stated Goals: Nonverbal Date of Onset:  (Not known) Prior Treatments: Dressing change  Pain Score:    Wound Assessment  Pressure Ulcer 01/21/13 Unstageable - Full thickness tissue loss in which the base of the ulcer is covered by slough (yellow, tan, gray, green or brown) and/or eschar (tan, brown or black) in the wound bed. (Active)  State of Healing Eschar 01/22/2013  9:00 AM  Site / Wound Assessment Black;Brown;Pink 01/22/2013  9:00 AM  % Wound base Red or Granulating 10% 01/22/2013  9:00 AM  % Wound base Yellow 0% 01/22/2013  9:00 AM  % Wound base Black 90% 01/22/2013  9:00 AM  % Wound base Other (Comment) 0% 01/22/2013  9:00 AM  Peri-wound Assessment Intact 01/22/2013  9:00 AM  Wound Length (cm) 7 cm 01/22/2013  9:00 AM  Wound Width (cm) 6 cm 01/22/2013  9:00 AM  Wound Depth (cm) 0.1 cm 01/22/2013  9:00 AM  Margins Unattacted edges (unapproximated) 01/22/2013  9:00 AM  Drainage Amount Minimal 01/22/2013  9:00 AM  Drainage Description Serosanguineous 01/22/2013  9:00 AM  Treatment Debridement (Selective);Hydrotherapy (Pulse lavage) 01/22/2013  9:00 AM  Dressing Type Gauze (Comment); Santyl;Foam 01/22/2013  9:00 AM  Dressing Changed 01/22/2013  9:00 AM      Hydrotherapy Pulsed lavage therapy - wound location: lt trochanter Pulsed Lavage with Suction (psi): 12 psi Pulsed Lavage with Suction - Normal Saline Used: 1000 mL Pulsed Lavage Tip: Tip with splash shield Selective Debridement Selective Debridement - Location: lt trochanter Selective Debridement - Tools Used: Forceps;Scalpel;Scissors Selective Debridement - Tissue Removed: scored eschar   Wound Assessment and Plan  Wound Therapy - Assess/Plan/Recommendations Wound Therapy - Clinical  Statement: Pt presents to hydrotherapy with unstageable wound to lt trochanter.  Can benefit from hydrotherapy to remove eschar and promote healing. Wound Therapy - Functional Problem List: Severe contractures due to MS limiting mobility Factors Delaying/Impairing Wound Healing: Immobility;Multiple medical problems;Polypharmacy Hydrotherapy Plan: Debridement;Dressing change;Patient/family education;Pulsatile lavage with suction Wound Therapy - Frequency: 6X / week Wound Therapy - Follow Up Recommendations: Home health RN Wound Plan: See above  Wound Therapy Goals- Improve the function of patient's integumentary system by progressing the wound(s) through the phases of wound healing (inflammation - proliferation - remodeling) by: Decrease Necrotic Tissue to: 70% Decrease Necrotic Tissue - Progress: Goal set today Increase Granulation Tissue to: 30% Increase Granulation Tissue - Progress: Goal set today Goals/treatment plan/discharge plan were made with and agreed upon by patient/family: No, Patient unable to participate in goals/treatment/discharge plan and family unavailable Time For Goal Achievement: 7 days Wound Therapy - Potential for Goals: Good  Goals will be updated until maximal potential achieved or discharge criteria met.  Discharge criteria: when goals achieved, discharge from hospital, MD decision/surgical intervention, no progress towards goals, refusal/missing three consecutive treatments without notification or medical reason.  GP     Terry Palmer 01/22/2013, 9:35 AM  [facility] PT [phone]

## 2013-01-22 NOTE — Progress Notes (Addendum)
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 He has been vomiting on/off for 2 weeks prior to admission per father who was present at admission. Father also mentioned that his abdomen had been more bloated as well.   Past medical history:  Past Medical History  Diagnosis Date  . MS (multiple sclerosis)   . 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 Childhood asthma   . Depression   . Neuromuscular disorder     Quadraperesis  . Recurrent UTI   . Dysphagia   . Bladder calculi   . Neurogenic bladder   . Shortness of breath   . Recurrent upper respiratory infection (URI)   . Normocytic anemia 05/28/2011  . Aspiration pneumonia 01/20/2013    Consultants: [facility], Gen Surg  Procedures: None  Antibiotics: Vanc 1/13--> Zosyn 1/13-->  Subjective: Patient non verbal.   Objective: Vital Signs  Filed Vitals:   01/22/13 0346 01/22/13 0423 01/22/13 0500 01/22/13 0806  BP: 117/64   109/75  Pulse: 112   98  Temp: 100.7 F (38.2 C)  99.3 F (37.4 C) 99.2 F (37.3 C)  TempSrc: Rectal  Rectal Axillary  Resp: 22   17  Height:      Weight:  87.1 kg (192 lb 0.3 oz)    SpO2: 97%   100%    Intake/Output Summary (Last 24 hours) at 01/22/13 0857 Last data filed at 01/22/13 0806  Gross per 24 hour  Intake 1792.5 ml  Output   1375 ml  Net  417.5 ml   Filed Weights   01/21/13 0208 01/22/13 0423  Weight: 88.3 kg (194 lb 10.7 oz) 87.1 kg (192 lb 0.3 oz)    General appearance: no distress and eyes open. Not responding. Resp: Improved air entry bilateral. Few crackles at bases. Cardio: regular rate and rhythm, S1, S2 normal, no murmur, click, rub or gallop GI: soft, non-tender; bowel sounds normal; no masses,  no organomegaly and PEG noted. Suprapubic catheter noted as  well with some yellowing drainage around catheter. Extremities: Multiple wounds noted in LE covered with dressings. Neurologic: Severe MS with contracted extremities.  Lab Results:  Basic Metabolic Panel:  Recent Labs Lab 01/20/13 1959 01/21/13 0405 01/22/13 0241  NA 138 139 139  K 4.3 4.2 4.4  CL 97 101 101  CO2 23 21 23   GLUCOSE 108* 78 80  BUN 13 13 11   CREATININE 0.61 0.72 0.82  CALCIUM 9.7 9.2 8.7   Liver Function Tests:  Recent Labs Lab 01/20/13 1959 01/21/13 0405  AST 24 18  ALT 19 17  ALKPHOS 149* 140*  BILITOT 0.3 0.4  PROT 9.1* 8.0  ALBUMIN 3.7 3.4*    Recent Labs Lab 01/20/13 1959  LIPASE 29   CBC:  Recent Labs Lab 01/20/13 1959 01/21/13 0405 01/22/13 0241  WBC 27.9* 21.4* 16.2*  NEUTROABS 26.0* 18.2*  --   HGB 14.3 12.8* 11.4*  HCT 41.9 38.6* 34.6*  MCV 83.8 84.6 87.4  PLT 392 392 346   CBG:  Recent Labs Lab 01/21/13 0412 01/21/13 2018 01/21/13 2349 01/22/13 0346 01/22/13 0803  GLUCAP 85 79 96 96 95    Recent Results (from the past 240 hour(s))  URINE CULTURE     Status: None   Collection Time    01/20/13  7:59 PM  Result Value Range Status   Specimen Description URINE, CATHETERIZED   Final   Special Requests NONE   Final   Culture  Setup Time     Final   Value: 01/21/2013 00:59     Performed at [facility]     Final   Value: NO GROWTH     Performed at [facility]   Culture     Final   Value: NO GROWTH     Performed at [facility]   Report Status 01/21/2013 FINAL   Final  CULTURE, BLOOD (ROUTINE X 2)     Status: None   Collection Time    01/20/13  9:47 PM      Result Value Range Status   Specimen Description BLOOD RIGHT HAND   Final   Special Requests BOTTLES DRAWN AEROBIC ONLY 1ML   Final   Culture  Setup Time     Final   Value: 01/21/2013 02:14     Performed at [facility]   Culture     Final   Value:        BLOOD CULTURE RECEIVED NO GROWTH TO DATE CULTURE  WILL BE HELD FOR 5 DAYS BEFORE ISSUING A FINAL NEGATIVE REPORT     Performed at [facility]   Report Status PENDING   Incomplete  CULTURE, BLOOD (ROUTINE X 2)     Status: None   Collection Time    01/20/13  9:54 PM      Result Value Range Status   Specimen Description BLOOD BLOOD RIGHT FOREARM   Final   Special Requests BOTTLES DRAWN AEROBIC ONLY 3CC   Final   Culture  Setup Time     Final   Value: 01/21/2013 02:14     Performed at [facility]   Culture     Final   Value:        BLOOD CULTURE RECEIVED NO GROWTH TO DATE CULTURE WILL BE HELD FOR 5 DAYS BEFORE ISSUING A FINAL NEGATIVE REPORT     Performed at [facility]   Report Status PENDING   Incomplete  MRSA PCR SCREENING     Status: None   Collection Time    01/21/13  3:52 AM      Result Value Range Status   MRSA by PCR NEGATIVE  NEGATIVE Final   Comment:            The GeneXpert MRSA Assay (FDA     approved for NASAL specimens     only), is one component of a     comprehensive MRSA colonization     surveillance program. It is not     intended to diagnose MRSA     infection nor to guide or     monitor treatment for     MRSA infections.      Studies/Results: Dg Chest 1 View  01/20/2013   CLINICAL DATA:  Vomiting for 2 days.  EXAM: CHEST - 1 VIEW  COMPARISON:  Single view of the chest 10/28/2012.  FINDINGS: Lung volumes are low but the lungs are clear. Heart size is normal. No pneumothorax or pleural effusion.  IMPRESSION: No acute disease.   Electronically Signed   By: Inge Rise M.D.   On: 01/20/2013 21:15   Ct Abdomen Pelvis W Contrast  01/20/2013   CLINICAL DATA:  Hematemesis.  EXAM: CT ABDOMEN AND PELVIS WITH CONTRAST  TECHNIQUE: Multidetector CT imaging of the abdomen  and pelvis was performed using the standard protocol following bolus administration of intravenous contrast.  CONTRAST:  135mL OMNIPAQUE IOHEXOL 300 MG/ML  SOLN  COMPARISON:  10/28/2012  FINDINGS: BODY WALL: Unremarkable.   LOWER CHEST: Severe circumferential distal esophageal thickening. The thickening is predominant low-density, compatible with edema. Finding is new from 10/28/2012, in favor of an inflammatory process. There are scattered reticular nodular and ground-glass opacities in the lower lungs. The opacities are could be atelectasis or low volume aspiration.  ABDOMEN/PELVIS:  Liver: No focal abnormality.  Biliary: No evidence of biliary obstruction or stone.  Pancreas: Unremarkable.  Spleen: Unremarkable.  Adrenals: Unremarkable.  Kidneys and ureters: Apparent hypoenhancement in the upper left kidney is most likely streak artifact. No hydronephrosis.  Bladder: There is a suprapubic catheter indwelling the thick walled bladder, limiting further assessment.  Reproductive: Unremarkable.  Bowel: Percutaneous gastrostomy. Gastrostomy tube is deep, with the retention balloon at the level of the duodenal bulb. The stomach is distended and fluid-filled. No inflammatory changes at the level of the duodenum. Normal appendix.  Retroperitoneum: No mass or adenopathy.  Peritoneum: No free fluid or gas.  Vascular: No acute abnormality.  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 There is related degenerative changes at the contact point between the femoral heads and posterior acetabular rims.  IMPRESSION: 1. Severe distal esophagitis, new from 10/28/2012. 2. Deep percutaneous gastrostomy tube, with retention balloon causing relative obstruction at the level of the duodenal bulb. This can be corrected with catheter retraction and repositioning of the retention disc. 3. Possible low volume aspiration in the lower lungs.   Electronically Signed   By: Jorje Guild M.D.   On: 01/20/2013 21:38    Medications:  Scheduled: . baclofen  20 mg Per Tube TID  . collagenase   Topical Daily  . diazepam  5 mg Per Tube QHS  . feeding supplement (OSMOLITE 1.5 CAL)   1,000 mL Per Tube Daily  . feeding supplement (PRO-STAT SUGAR FREE 64)  30 mL Per Tube TID  . free water  240 mL Per Tube Q4H  . levETIRAcetam  750 mg Intravenous Q12H  . pantoprazole (PROTONIX) IV  40 mg Intravenous Q12H  . piperacillin-tazobactam (ZOSYN)  IV  3.375 g Intravenous Q8H  . vancomycin  1,000 mg Intravenous Q8H   Continuous:   MBW:[id number], diphenhydrAMINE, levalbuterol  Assessment/Plan:  Principal Problem:   Sepsis Active Problems:   Multiple sclerosis   Bowel obstruction   Aspiration pneumonia   Hematemesis    Duodenal Obstruction from PEG Balloon Repositioned in ED. Seen by general surgery as they placed the PEG in 2013. PEG appears to be working well at this time. Abdomen not distended today.   Hematemesis Likely due to malpositioned PEG with irritation of mucosa versus mallory wies. Hgb stable. Continue PPI.  Aspiration PNA Continue broad spectrum coverage for now. Saturating well. Nebs. WBC is better. Cultures negative so far.  Nutrition PEG feeds resumed and seems to be tolerating well. Will resume medications as well.  Advanced MS Poor long term prognosis.   History of Seizure Disorder On Keppra  Code Status: Full Code. Mother confirmed this yesterday. Seen by PMT in past. DVT Prophylaxis: SCD's    Family Communication: No family at bedside. Discussed with mother 1/14. Disposition Plan: Is from SNF. But family wants to take him home. Will involve CM.    LOS: 2 days   [facility] Pager [phone]  01/22/2013, 8:57 AM  If 8PM-8AM, please contact night-coverage at [email or web address], password [facility]

## 2013-01-22 NOTE — Progress Notes (Signed)
Patient ID: Terry Palmer, male   DOB: 22-May-1975, 38 y.o.   MRN: [id number]    Subjective: Pt sleeping  Objective: Vital signs in last 24 hours: Temp:  [98.4 F (36.9 C)-100.7 F (38.2 C)] 99.3 F (37.4 C) (01/15 0500) Pulse Rate:  [106-116] 112 (01/15 0346) Resp:  [17-22] 22 (01/15 0346) BP: (108-128)/(57-86) 117/64 mmHg (01/15 0346) SpO2:  [90 %-98 %] 97 % (01/15 0346) Weight:  [192 lb 0.3 oz (87.1 kg)] 192 lb 0.3 oz (87.1 kg) (01/15 0423)    Intake/Output from previous day: 01/14 0701 - 01/15 0700 In: 2017.5 [I.V.:900; IV Piggyback:607.5] Out: 925 [Urine:925] Intake/Output this shift:    PE: Abd: soft, g tube in place with tube feeds running at 25cc/hr  Lab Results:   Recent Labs  01/21/13 0405 01/22/13 0241  WBC 21.4* 16.2*  HGB 12.8* 11.4*  HCT 38.6* 34.6*  PLT 392 346   BMET  Recent Labs  01/21/13 0405 01/22/13 0241  NA 139 139  K 4.2 4.4  CL 101 101  CO2 21 23  GLUCOSE 78 80  BUN 13 11  CREATININE 0.72 0.82  CALCIUM 9.2 8.7   PT/INR  Recent Labs  01/20/13 1959  LABPROT 12.6  INR 0.96   CMP     Component Value Date/Time   NA 139 01/22/2013 0241   K 4.4 01/22/2013 0241   CL 101 01/22/2013 0241   CO2 23 01/22/2013 0241   GLUCOSE 80 01/22/2013 0241   BUN 11 01/22/2013 0241   CREATININE 0.82 01/22/2013 0241   CALCIUM 8.7 01/22/2013 0241   PROT 8.0 01/21/2013 0405   ALBUMIN 3.4* 01/21/2013 0405   AST 18 01/21/2013 0405   ALT 17 01/21/2013 0405   ALKPHOS 140* 01/21/2013 0405   BILITOT 0.4 01/21/2013 0405   GFRNONAA >90 01/22/2013 0241   GFRAA >90 01/22/2013 0241   Lipase     Component Value Date/Time   LIPASE 29 01/20/2013 1959       Studies/Results: Dg Chest 1 View  01/20/2013   CLINICAL DATA:  Vomiting for 2 days.  EXAM: CHEST - 1 VIEW  COMPARISON:  Single view of the chest 10/28/2012.  FINDINGS: Lung volumes are low but the lungs are clear. Heart size is normal. No pneumothorax or pleural effusion.  IMPRESSION: No acute disease.    Electronically Signed   By: Inge Rise M.D.   On: 01/20/2013 21:15   Ct Abdomen Pelvis W Contrast  01/20/2013   CLINICAL DATA:  Hematemesis.  EXAM: CT ABDOMEN AND PELVIS WITH CONTRAST  TECHNIQUE: Multidetector CT imaging of the abdomen and pelvis was performed using the standard protocol following bolus administration of intravenous contrast.  CONTRAST:  124mL OMNIPAQUE IOHEXOL 300 MG/ML  SOLN  COMPARISON:  10/28/2012  FINDINGS: BODY WALL: Unremarkable.  LOWER CHEST: Severe circumferential distal esophageal thickening. The thickening is predominant low-density, compatible with edema. Finding is new from 10/28/2012, in favor of an inflammatory process. There are scattered reticular nodular and ground-glass opacities in the lower lungs. The opacities are could be atelectasis or low volume aspiration.  ABDOMEN/PELVIS:  Liver: No focal abnormality.  Biliary: No evidence of biliary obstruction or stone.  Pancreas: Unremarkable.  Spleen: Unremarkable.  Adrenals: Unremarkable.  Kidneys and ureters: Apparent hypoenhancement in the upper left kidney is most likely streak artifact. No hydronephrosis.  Bladder: There is a suprapubic catheter indwelling the thick walled bladder, limiting further assessment.  Reproductive: Unremarkable.  Bowel: Percutaneous gastrostomy. Gastrostomy tube is deep, with the retention  balloon at the level of the duodenal bulb. The stomach is distended and fluid-filled. No inflammatory changes at the level of the duodenum. Normal appendix.  Retroperitoneum: No mass or adenopathy.  Peritoneum: No free fluid or gas.  Vascular: No acute abnormality.  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 There is related degenerative changes at the contact point between the femoral heads and posterior acetabular rims.  IMPRESSION: 1. Severe distal esophagitis, new from 10/28/2012. 2. Deep percutaneous gastrostomy tube, with  retention balloon causing relative obstruction at the level of the duodenal bulb. This can be corrected with catheter retraction and repositioning of the retention disc. 3. Possible low volume aspiration in the lower lungs.   Electronically Signed   By: Jorje Guild M.D.   On: 01/20/2013 21:38    Anti-infectives: Anti-infectives   Start     Dose/Rate Route Frequency Ordered Stop   01/21/13 0600  piperacillin-tazobactam (ZOSYN) IVPB 3.375 g     3.375 g 12.5 mL/hr over 240 Minutes Intravenous 3 times per day 01/21/13 0317     01/20/13 2200  vancomycin (VANCOCIN) IVPB 1000 mg/200 mL premix     1,000 mg 200 mL/hr over 60 Minutes Intravenous Every 8 hours 01/20/13 2144     01/20/13 2100  piperacillin-tazobactam (ZOSYN) IVPB 3.375 g     3.375 g 100 mL/hr over 30 Minutes Intravenous  Once 01/20/13 2050 01/20/13 2335       Assessment/Plan  1. Malpositioned g-tube, repositioned  Plan: 1. Cont TF 2. No further recommendations.  Will sign off.   LOS: 2 days    Breane Grunwald E 01/22/2013, 7:38 AM Pager: [phone]

## 2013-01-22 NOTE — Progress Notes (Signed)
NUTRITION FOLLOW UP  Intervention:   Recommend advancement of TF via PEG with Osmolite 1.5. Increase per MD discretion to goal rate of 7ml/h.  Continue Prostat 30 ml TID. Goal regimine to provide 2280 kcals, 128 gm protein, 1006 ml free water daily.  Continue home free water flushes of 240 ml q 4 hours to provide an additional 1440 ml free water daily. Add liquid MVI daily until goal rate of 55 ml/h has been reached.  RD to continue to follow nutrition care plan.    Nutrition Dx:   Inadequate oral intake related to inability to eat as evidenced by NPO status. Ongoing   Goal:   Intake to meet >90% of estimated nutrition needs. Ongoing   Monitor:   weight trends, lab trends, I/O's, tolerance of enteral nutrition   Assessment:   PMHx significant for severe MS, quadriplegia, FTT, frequent infections, nonverbal. Admitted with vomiting on/off x 2 weeks, fever, bloated abdomen. Work-up reveals obstruction of feeding tube.  Pt's PEG tube has been ok to use per surgery. Enteral nutrition of Osmolite 1.5 at 25 ml/h initiated 1/14. Discussed tube feeding regimen with RN and pt is currently tolerating TF. Per RN plan is to advance Osmolite 1.5 at 92ml/h at 6pm tonight. Note that RD recommendation is for goal rate of 55 ml/h. Will continue to follow to assess adequacy of nutrition regimen.  Current rate of Osmolite 1.5 at 58ml/h provides 900 kcals, 38 grams of protein, and 456 ml of free water. Current rate with prostat provides 1200 kcals, 83 grams of protein, and 456 ml of free water.  Pt currently ordered for 240ml water flushes q 4 hours which provides an additional 1440 ml of free water daily.  Physician ordered adult TF protocol in place, however RD was not consulted to manage. Wound care nurse saw pt on 1/14. Noted pt has not had a BM since PTA.  Height: Ht Readings from Last 1 Encounters:  01/21/13 5\' 10"  (1.778 m)    Weight Status:   Wt Readings from Last 1 Encounters:   01/22/13 192 lb 0.3 oz (87.1 kg)  Admit weight 194 lbs. Stable  Re-estimated needs:  Kcal: 2050 - 2250 Protein: 115-130 grams Fluid: >2.3 L/day  Skin: Wound type:  1. Left heel: Stage III Pressure ulcer 3cm x 4cm x 0.2cm  2. Right medial calf: Stage II pressure ulcer 0.5cm x 0.5cm x 0.2cm  3. Left trochanter: Unstageable Pressure Ulcer-8cm x 9cm x 0.2cm  4. Right ischium: healed Stage III  5. Left ischium: Stage III Pressure Ulcer 4cm x 4cm x 0.5cm    Diet Order: NPO   Intake/Output Summary (Last 24 hours) at 01/22/13 1033 Last data filed at 01/22/13 1000  Gross per 24 hour  Intake 1942.5 ml  Output   1375 ml  Net  567.5 ml    Last BM: PTA   Labs:   Recent Labs Lab 01/20/13 1959 01/21/13 0405 01/22/13 0241  NA 138 139 139  K 4.3 4.2 4.4  CL 97 101 101  CO2 23 21 23   BUN 13 13 11   CREATININE 0.61 0.72 0.82  CALCIUM 9.7 9.2 8.7  GLUCOSE 108* 78 80    CBG (last 3)   Recent Labs  01/21/13 2349 01/22/13 0346 01/22/13 0803  GLUCAP 96 96 95    Scheduled Meds: . ascorbic acid  500 mg Per Tube BID  . baclofen  20 mg Per Tube TID  . buPROPion  75 mg Oral TID  .  collagenase   Topical Daily  . diazepam  5 mg Per Tube QHS  . docusate  100 mg Per Tube Daily  . feeding supplement (OSMOLITE 1.5 CAL)  1,000 mL Per Tube Daily  . feeding supplement (PRO-STAT SUGAR FREE 64)  30 mL Per Tube TID  . free water  240 mL Per Tube Q4H  . levETIRAcetam  750 mg Per Tube BID  . pantoprazole sodium  40 mg Per Tube BID  . piperacillin-tazobactam (ZOSYN)  IV  3.375 g Intravenous Q8H  . vancomycin  1,000 mg Intravenous Q8H    Continuous Infusions:   Kallie Locks Dietetic Intern Pager: [phone]  I agree with the above information and made appropriate revisions. Inda Coke MS, RD, LDN Pager: [phone] After-hours pager: [phone]

## 2013-01-22 NOTE — Progress Notes (Signed)
Call with issues 

## 2013-01-23 LAB — BASIC METABOLIC PANEL
BUN: 11 mg/dL (ref 6–23)
CALCIUM: 9 mg/dL (ref 8.4–10.5)
CO2: 26 mEq/L (ref 19–32)
Chloride: 102 mEq/L (ref 96–112)
Creatinine, Ser: 0.78 mg/dL (ref 0.50–1.35)
GFR calc Af Amer: >90 mL/min (ref >90)
Glucose, Bld: 90 mg/dL (ref 70–99)
Potassium: 3.6 mEq/L — ABNORMAL LOW (ref 3.7–5.3)
SODIUM: 142 meq/L (ref 137–147)

## 2013-01-23 LAB — GLUCOSE, CAPILLARY
GLUCOSE-CAPILLARY: 96 mg/dL (ref 70–99)
GLUCOSE-CAPILLARY: 106 mg/dL — AB (ref 70–99)
Glucose-Capillary: 105 mg/dL — ABNORMAL HIGH (ref 70–99)
Glucose-Capillary: 110 mg/dL — ABNORMAL HIGH (ref 70–99)
Glucose-Capillary: 153 mg/dL — ABNORMAL HIGH (ref 70–99)

## 2013-01-23 LAB — CBC
HCT: 34.4 % — ABNORMAL LOW (ref 39.0–52.0)
Hemoglobin: 11.3 g/dL — ABNORMAL LOW (ref 13.0–17.0)
MCH: 28.6 pg (ref 26.0–34.0)
MCHC: 32.8 g/dL (ref 30.0–36.0)
MCV: 87.1 fL (ref 78.0–100.0)
Platelets: 347 10*3/uL (ref 150–400)
RBC: 3.95 MIL/uL — ABNORMAL LOW (ref 4.22–5.81)
RDW: 14.4 % (ref 11.5–15.5)
WBC: 10.8 10*3/uL — ABNORMAL HIGH (ref 4.0–10.5)

## 2013-01-23 MED ORDER — OSMOLITE 1.5 CAL PO LIQD
1000.0000 mL | ORAL | Status: DC
  Administered 2013-01-23: 1000 mL
  Filled 2013-01-23 (×2): qty 1000

## 2013-01-23 MED ORDER — POTASSIUM CHLORIDE 20 MEQ/15ML (10%) PO LIQD
40.0000 meq | Freq: Once | ORAL | Status: AC
  Administered 2013-01-23: 40 meq
  Filled 2013-01-23: qty 30

## 2013-01-23 MED ORDER — SODIUM CHLORIDE 0.9 % IV SOLN
1.5000 g | Freq: Four times a day (QID) | INTRAVENOUS | Status: DC
  Administered 2013-01-23 – 2013-01-25 (×8): 1.5 g via INTRAVENOUS
  Filled 2013-01-23 (×13): qty 1.5

## 2013-01-23 MED ORDER — VITAMIN C 500 MG PO TABS
500.0000 mg | ORAL_TABLET | Freq: Two times a day (BID) | ORAL | Status: DC
  Administered 2013-01-23 – 2013-01-26 (×6): 500 mg
  Filled 2013-01-23 (×8): qty 1

## 2013-01-23 NOTE — Progress Notes (Signed)
NUTRITION FOLLOW UP  Intervention:   1.Recommend advancement of TF via PEG with Osmolite 1.5. Increase per MD discretion to goal rate of 44ml/h.   2.Continue Prostat 30 ml TID.   Goal regimine to provide 2280 kcals, 128 gm protein, 1006 ml free water daily.   3.Continue home free water flushes of 240 ml q 4 hours to provide an additional 1440 ml free water daily.   4.Continue MVI daily   Nutrition Dx:   Inadequate oral intake related to inability to eat as evidenced by NPO status. Ongoing   Goal:   Intake to meet >90% of estimated nutrition needs. Ongoing  Monitor:   weight trends, lab trends, I/O's, tolerance of enteral nutrition  Assessment:   PMHx significant for severe MS, quadriplegia, FTT, frequent infections, nonverbal. Admitted with vomiting on/off x 2 weeks, fever, bloated abdomen. Work-up reveals obstruction of feeding tube.   Pt's PEG tube has been ok to use per surgery. Enteral nutrition of Osmolite 1.5 at 35 ml/h initiated 1/14.  Physician ordered adult TF protocol in place, however RD was not consulted to manage.   Discussed tube feeding regimen with RN and pt is currently tolerating TF.  Will continue to follow to assess adequacy of nutrition regimen.   MD advancing tube feeding rates daily.  Current rate of Osmolite 1.5 at 58ml/h (just advanced) with 30 ml Prostat TID provides 1920 kcal (meets 94% of minimum needs) , 112 grams of protein (meets 97% of minimum needs), and 822 ml of free water.  Pt currently ordered for 252ml water flushes q 4 hours which provides an additional 1440 ml of free water daily.  Total free water: 2262 ml   Potassium low and being repleted.   Height: Ht Readings from Last 1 Encounters:  01/21/13 5\' 10"  (1.778 m)    Weight Status:   Wt Readings from Last 1 Encounters:  01/22/13 192 lb 0.3 oz (87.1 kg)  Admission weight 194 lb Usual weight 183 lb (1 year ago)  Re-estimated needs:  Kcal: 2050-2250 Protein: 115-130  grams Fluid: >2.3 L/day    Skin: Wound type:  1. Left heel: Stage III Pressure ulcer 3cm x 4cm x 0.2cm  2. Right medial calf: Stage II pressure ulcer 0.5cm x 0.5cm x 0.2cm  3. Left trochanter: Unstageable Pressure Ulcer-8cm x 9cm x 0.2cm  4. Right ischium: healed Stage III  5. Left ischium: Stage III Pressure Ulcer 4cm x 4cm x 0.5cm  Diet Order: NPO   Intake/Output Summary (Last 24 hours) at 01/23/13 1221 Last data filed at 01/23/13 0800  Gross per 24 hour  Intake    890 ml  Output   2725 ml  Net  -1835 ml    Last BM: 1/16 - per RN large but not diarrhea  Pt on miralax and colace   Labs:   Recent Labs Lab 01/21/13 0405 01/22/13 0241 01/23/13 0254  NA 139 139 142  K 4.2 4.4 3.6*  CL 101 101 102  CO2 21 23 26   BUN 13 11 11   CREATININE 0.72 0.82 0.78  CALCIUM 9.2 8.7 9.0  GLUCOSE 78 80 90    CBG (last 3)   Recent Labs  01/23/13 0003 01/23/13 0359 01/23/13 0752  GLUCAP 110* 105* 153*    Scheduled Meds: . ascorbic acid  500 mg Per Tube BID  . baclofen  20 mg Per Tube TID  . buPROPion  75 mg Oral TID  . collagenase   Topical Daily  . diazepam  5 mg Per Tube QHS  . docusate  100 mg Per Tube Daily  . feeding supplement (OSMOLITE 1.5 CAL)  1,000 mL Per Tube Daily  . feeding supplement (PRO-STAT SUGAR FREE 64)  30 mL Per Tube TID  . free water  240 mL Per Tube Q4H  . levETIRAcetam  750 mg Per Tube BID  . multivitamin  5 mL Per Tube Daily  . pantoprazole sodium  40 mg Per Tube BID  . piperacillin-tazobactam (ZOSYN)  IV  3.375 g Intravenous Q8H  . vancomycin  1,000 mg Intravenous Q8H    Continuous Infusions:   Kallie Locks Dietetic Intern Pager: [phone]  Intern note reviewed. Revisions made.  [place], [place], [place] Pager [phone] After Hours Pager

## 2013-01-23 NOTE — Progress Notes (Signed)
Physical Therapy Wound Treatment Patient Details  Name: MASIAH WOODY MRN: [id number] Date of Birth: 02-17-1975  Today's Date: 01/23/2013 Time: 0900-0918 Time Calculation (min): 18 min  Subjective     Pain Score:  No signs of pain  Wound Assessment     Pressure Ulcer 01/21/13 Unstageable - Full thickness tissue loss in which the base of the ulcer is covered by slough (yellow, tan, gray, green or brown) and/or eschar (tan, brown or black) in the wound bed. (Active)  State of Healing Eschar 01/23/2013 10:12 AM  Site / Wound Assessment Black;Brown;Pink 01/23/2013 10:12 AM  % Wound base Red or Granulating 10% 01/23/2013 10:12 AM  % Wound base Yellow 0% 01/23/2013 10:12 AM  % Wound base Black 90% 01/23/2013 10:12 AM  % Wound base Other (Comment) 0% 01/23/2013 10:12 AM  Peri-wound Assessment Intact 01/23/2013 10:12 AM  Wound Length (cm) 7 cm 01/22/2013  9:00 AM  Wound Width (cm) 6 cm 01/22/2013  9:00 AM  Wound Depth (cm) 0.1 cm 01/22/2013  9:00 AM  Margins Unattacted edges (unapproximated) 01/23/2013 10:12 AM  Drainage Amount Minimal 01/23/2013 10:12 AM  Drainage Description Serosanguineous 01/23/2013 10:12 AM  Treatment Hydrotherapy (Pulse lavage) 01/23/2013 10:12 AM  Dressing Type Moist to dry;Gauze (Comment);Foam 01/23/2013 10:12 AM  Dressing Changed 01/23/2013 10:12 AM      Hydrotherapy Pulsed lavage therapy - wound location: lt trochanter Pulsed Lavage with Suction (psi): 12 psi Pulsed Lavage with Suction - Normal Saline Used: 1000 mL Pulsed Lavage Tip: Tip with splash shield Selective Debridement Selective Debridement - Location: lt trochanter Selective Debridement - Tools Used: Scalpel Selective Debridement - Tissue Removed: scored eschar   Wound Assessment and Plan  Wound Therapy - Assess/Plan/Recommendations Wound Therapy - Clinical Statement: No change in wound.  Hydrotherapy Plan: Debridement;Dressing change;Patient/family education;Pulsatile lavage with suction Wound Therapy -  Frequency: 6X / week Wound Therapy - Follow Up Recommendations: Home health RN Wound Plan: See above  Wound Therapy Goals- Improve the function of patient's integumentary system by progressing the wound(s) through the phases of wound healing (inflammation - proliferation - remodeling) by: Decrease Necrotic Tissue to: 70% Decrease Necrotic Tissue - Progress: Progressing toward goal Increase Granulation Tissue to: 30% Increase Granulation Tissue - Progress: Progressing toward goal  Goals will be updated until maximal potential achieved or discharge criteria met.  Discharge criteria: when goals achieved, discharge from hospital, MD decision/surgical intervention, no progress towards goals, refusal/missing three consecutive treatments without notification or medical reason.  GP     Clelia Trabucco 01/23/2013, 10:16 AM  Suanne Marker PT [phone]

## 2013-01-23 NOTE — Progress Notes (Signed)
[facility] PROGRESS NOTE  Terry Palmer YDX:[id number] DOB: [birth year] DOA: 01/20/2013  PCP: Garwin Brothers, MD  Brief HPI: 38 yo male with severe multiple sclerosis resulting in quadraplegia, freq infections, FTT, nonverbal comes in with hematemesis. He has been vomiting on/off for 2 weeks prior to admission per father who was present at admission. Father also mentioned that his abdomen had been more bloated as well.   Past medical history:  Past Medical History  Diagnosis Date  . MS (multiple sclerosis)   . 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 Childhood asthma   . Depression   . Neuromuscular disorder     Quadraperesis  . Recurrent UTI   . Dysphagia   . Bladder calculi   . Neurogenic bladder   . Shortness of breath   . Recurrent upper respiratory infection (URI)   . Normocytic anemia 05/28/2011  . Aspiration pneumonia 01/20/2013    Consultants: [facility] GI, Gen Surg  Procedures: None  Antibiotics: Vanc 1/13--> Zosyn 1/13-->  Subjective: Patient non verbal. Looks more comfortable today.  Objective: Vital Signs  Filed Vitals:   01/23/13 0000 01/23/13 0005 01/23/13 0400 01/23/13 0749  BP:  94/48 101/65 101/63  Pulse:  93 89   Temp: 97.7 F (36.5 C)  97.5 F (36.4 C) 98.5 F (36.9 C)  TempSrc: Oral  Oral Axillary  Resp:  21 14 16   Height:      Weight:      SpO2:  97% 100% 98%    Intake/Output Summary (Last 24 hours) at 01/23/13 1234 Last data filed at 01/23/13 0800  Gross per 24 hour  Intake    890 ml  Output   2725 ml  Net  -1835 ml   Filed Weights   01/21/13 0208 01/22/13 0423  Weight: 88.3 kg (194 lb 10.7 oz) 87.1 kg (192 lb 0.3 oz)    General appearance: no distress and eyes open. Not responding. Resp: Much improved air entry bilaterally. Few crackles at bases. No wheezing. Cardio: regular rate and rhythm, S1, S2 normal, no murmur, click, rub or gallop GI: soft, non-tender; bowel sounds normal; no masses,  no organomegaly and PEG noted.  Suprapubic catheter noted as well with some yellowing drainage around catheter. Extremities: Multiple wounds noted in LE covered with dressings. Neurologic: Severe MS with contracted extremities.  Lab Results:  Basic Metabolic Panel:  Recent Labs Lab 01/20/13 1959 01/21/13 0405 01/22/13 0241 01/23/13 0254  NA 138 139 139 142  K 4.3 4.2 4.4 3.6*  CL 97 101 101 102  CO2 23 21 23 26   GLUCOSE 108* 78 80 90  BUN 13 13 11 11   CREATININE 0.61 0.72 0.82 0.78  CALCIUM 9.7 9.2 8.7 9.0   Liver Function Tests:  Recent Labs Lab 01/20/13 1959 01/21/13 0405  AST 24 18  ALT 19 17  ALKPHOS 149* 140*  BILITOT 0.3 0.4  PROT 9.1* 8.0  ALBUMIN 3.7 3.4*    Recent Labs Lab 01/20/13 1959  LIPASE 29   CBC:  Recent Labs Lab 01/20/13 1959 01/21/13 0405 01/22/13 0241 01/23/13 0254  WBC 27.9* 21.4* 16.2* 10.8*  NEUTROABS 26.0* 18.2*  --   --   HGB 14.3 12.8* 11.4* 11.3*  HCT 41.9 38.6* 34.6* 34.4*  MCV 83.8 84.6 87.4 87.1  PLT 392 392 346 347   CBG:  Recent Labs Lab 01/22/13 2000 01/23/13 0003 01/23/13 0359 01/23/13 0752 01/23/13 1136  GLUCAP 96 110* 105* 153* 106*    Recent Results (from the past 240  hour(s))  URINE CULTURE     Status: None   Collection Time    01/20/13  7:59 PM      Result Value Range Status   Specimen Description URINE, CATHETERIZED   Final   Special Requests NONE   Final   Culture  Setup Time     Final   Value: 01/21/2013 00:59     Performed at [facility]     Final   Value: NO GROWTH     Performed at [facility]   Culture     Final   Value: NO GROWTH     Performed at [facility]   Report Status 01/21/2013 FINAL   Final  CULTURE, BLOOD (ROUTINE X 2)     Status: None   Collection Time    01/20/13  9:47 PM      Result Value Range Status   Specimen Description BLOOD RIGHT HAND   Final   Special Requests BOTTLES DRAWN AEROBIC ONLY 1ML   Final   Culture  Setup Time     Final   Value: 01/21/2013  02:14     Performed at [facility]   Culture     Final   Value:        BLOOD CULTURE RECEIVED NO GROWTH TO DATE CULTURE WILL BE HELD FOR 5 DAYS BEFORE ISSUING A FINAL NEGATIVE REPORT     Performed at [facility]   Report Status PENDING   Incomplete  CULTURE, BLOOD (ROUTINE X 2)     Status: None   Collection Time    01/20/13  9:54 PM      Result Value Range Status   Specimen Description BLOOD BLOOD RIGHT FOREARM   Final   Special Requests BOTTLES DRAWN AEROBIC ONLY 3CC   Final   Culture  Setup Time     Final   Value: 01/21/2013 02:14     Performed at [facility]   Culture     Final   Value:        BLOOD CULTURE RECEIVED NO GROWTH TO DATE CULTURE WILL BE HELD FOR 5 DAYS BEFORE ISSUING A FINAL NEGATIVE REPORT     Performed at [facility]   Report Status PENDING   Incomplete  MRSA PCR SCREENING     Status: None   Collection Time    01/21/13  3:52 AM      Result Value Range Status   MRSA by PCR NEGATIVE  NEGATIVE Final   Comment:            The GeneXpert MRSA Assay (FDA     approved for NASAL specimens     only), is one component of a     comprehensive MRSA colonization     surveillance program. It is not     intended to diagnose MRSA     infection nor to guide or     monitor treatment for     MRSA infections.      Studies/Results: No results found.  Medications:  Scheduled: . ampicillin-sulbactam (UNASYN) IV  1.5 g Intravenous Q6H  . ascorbic acid  500 mg Per Tube BID  . baclofen  20 mg Per Tube TID  . buPROPion  75 mg Oral TID  . collagenase   Topical Daily  . diazepam  5 mg Per Tube QHS  . docusate  100 mg Per Tube Daily  . feeding supplement (OSMOLITE 1.5 CAL)  1,000 mL Per Tube Q24H  . feeding supplement (PRO-STAT SUGAR FREE 64)  30 mL Per Tube TID  . free water  240 mL Per Tube Q4H  . levETIRAcetam  750 mg Per Tube BID  . multivitamin  5 mL Per Tube Daily  . pantoprazole sodium  40 mg Per Tube BID  . 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 Seen by general surgery as they placed the PEG in 2013. PEG appears to be working well at this time. Abdomen not distended.   Hematemesis Likely due to malpositioned PEG with irritation of mucosa versus mallory wies. Hgb stable. Continue PPI.  Aspiration PNA Cultures negative. Change to Unasyn for now with eventual transition to Augmentin on discharge. Saturating well. Nebs. WBC is better.   Nutrition PEG feeds resumed and seems to be tolerating well. Will resume medications as well.  Advanced MS Poor long term prognosis.   History of Seizure Disorder On Keppra  Code Status: Full Code.  DVT Prophylaxis: SCD's    Family Communication: No family at bedside. Discussed with mother 1/14. Disposition Plan: Is from SNF. But family wants to take him home. CM following. Ca transfer to floor as long as nursing care is not compromised. Anticipate discharge 1/19.    LOS: 3 days   [facility] Pager [phone] 01/23/2013, 12:34 PM  If 8PM-8AM, please contact night-coverage at [email or web address], password [facility]

## 2013-01-23 NOTE — Progress Notes (Signed)
Pt tx to 6N per MD order, family at bedside to go with, also SNF representatives as well. Belongs packed and sent to new room with pt. Tube feed placed on hold during tx to new room as well. VSS all meds current to time.

## 2013-01-24 LAB — GLUCOSE, CAPILLARY
GLUCOSE-CAPILLARY: 120 mg/dL — AB (ref 70–99)
Glucose-Capillary: 102 mg/dL — ABNORMAL HIGH (ref 70–99)

## 2013-01-24 MED ORDER — LORATADINE 10 MG PO TABS: 10.0000 mg | ORAL_TABLET | Freq: Every day | ORAL | Status: DC

## 2013-01-24 MED ORDER — BUPROPION HCL 75 MG PO TABS: 75.0000 mg | ORAL_TABLET | Freq: Three times a day (TID) | ORAL | Status: AC

## 2013-01-24 MED ORDER — CRANBERRY JUICE POWDER 425 MG PO CAPS: 425.0000 mg | ORAL_CAPSULE | Freq: Two times a day (BID) | ORAL | Status: DC

## 2013-01-24 MED ORDER — OSMOLITE 1.5 CAL PO LIQD
1000.0000 mL | ORAL | Status: DC
  Administered 2013-01-24: 1000 mL
  Filled 2013-01-24 (×5): qty 1000

## 2013-01-24 MED ORDER — VITAMIN D 1000 UNITS PO TABS: 1000.0000 [IU] | ORAL_TABLET | Freq: Two times a day (BID) | ORAL | Status: DC

## 2013-01-24 MED ORDER — POLYVINYL ALCOHOL 1.4 % OP SOLN: 1.0000 [drp] | Freq: Three times a day (TID) | OPHTHALMIC | Status: AC

## 2013-01-24 MED ORDER — FREE WATER: 240.0000 mL | Status: DC

## 2013-01-24 MED ORDER — PRO-STAT SUGAR FREE PO LIQD: 30.0000 mL | Freq: Three times a day (TID) | ORAL | Status: DC

## 2013-01-24 MED ORDER — COLLAGENASE 250 UNIT/GM EX OINT: 1.0000 "application " | TOPICAL_OINTMENT | CUTANEOUS | Status: DC

## 2013-01-24 MED ORDER — TRAMADOL HCL 50 MG PO TABS: 50.0000 mg | ORAL_TABLET | Freq: Three times a day (TID) | ORAL | Status: DC | PRN

## 2013-01-24 MED ORDER — VITAMIN C 500 MG/5ML PO SYRP: 500.0000 mg | ORAL_SOLUTION | Freq: Two times a day (BID) | ORAL | Status: DC

## 2013-01-24 MED ORDER — POLYETHYLENE GLYCOL 3350 17 G PO PACK: 17.0000 g | PACK | Freq: Every day | ORAL | Status: AC

## 2013-01-24 MED ORDER — OMEPRAZOLE 2 MG/ML ORAL SUSPENSION: 40.0000 mg | Freq: Every day | ORAL | Status: DC

## 2013-01-24 MED ORDER — DOCUSATE SODIUM 50 MG/5ML PO LIQD: 100.0000 mg | Freq: Every day | ORAL | Status: DC

## 2013-01-24 MED ORDER — DIAZEPAM 5 MG PO TABS: 5.0000 mg | ORAL_TABLET | Freq: Every day | ORAL | Status: DC

## 2013-01-24 MED ORDER — ADULT MULTIVITAMIN W/MINERALS CH: 1.0000 | ORAL_TABLET | Freq: Every day | ORAL | Status: DC

## 2013-01-24 MED ORDER — NYSTATIN 100000 UNIT/GM EX POWD: 1.0000 g | Freq: Three times a day (TID) | CUTANEOUS | Status: DC

## 2013-01-24 MED ORDER — LEVETIRACETAM 100 MG/ML PO SOLN: 750.0000 mg | Freq: Two times a day (BID) | ORAL | Status: DC

## 2013-01-24 MED ORDER — MENTHOL-ZINC OXIDE 0.44-20.625 % EX OINT: TOPICAL_OINTMENT | CUTANEOUS | Status: DC

## 2013-01-24 MED ORDER — BACLOFEN 20 MG PO TABS: 20.0000 mg | ORAL_TABLET | Freq: Three times a day (TID) | ORAL | Status: DC

## 2013-01-24 MED ORDER — OSMOLITE 1.5 CAL PO LIQD: 1000.0000 mL | ORAL | Status: DC

## 2013-01-24 NOTE — Progress Notes (Signed)
[facility] PROGRESS NOTE  SHEILA GERVASI XNA:[id number] DOB: [birth year] DOA: 01/20/2013  PCP: Garwin Brothers, MD  Brief HPI: 38 yo male with severe multiple sclerosis resulting in quadraplegia, freq infections, FTT, nonverbal comes in with hematemesis. He has been vomiting on/off for 2 weeks prior to admission per father who was present at admission. Father also mentioned that his abdomen had been more bloated as well.   Past medical history:  Past Medical History  Diagnosis Date  . MS (multiple sclerosis)   . 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 Childhood asthma   . Depression   . Neuromuscular disorder     Quadraperesis  . Recurrent UTI   . Dysphagia   . Bladder calculi   . Neurogenic bladder   . Shortness of breath   . Recurrent upper respiratory infection (URI)   . Normocytic anemia 05/28/2011  . Aspiration pneumonia 01/20/2013    Consultants: [facility], Gen Surg  Procedures: None  Antibiotics: Vanc 1/13--> Zosyn 1/13-->  Subjective: Patient non verbal. No changes.  Objective: Vital Signs  Filed Vitals:   01/23/13 1736 01/23/13 2251 01/24/13 0523 01/24/13 1126  BP: 108/61 114/74 113/79   Pulse: 93 93 90   Temp: 99.3 F (37.4 C) 99.2 F (37.3 C) 97.2 F (36.2 C)   TempSrc: Axillary Axillary Oral   Resp: 17 18 16    Height:      Weight:    85.1 kg (187 lb 9.8 oz)  SpO2: 98% 97% 100%     Intake/Output Summary (Last 24 hours) at 01/24/13 1246 Last data filed at 01/24/13 1127  Gross per 24 hour  Intake    705 ml  Output   2325 ml  Net  -1620 ml   Filed Weights   01/21/13 0208 01/22/13 0423 01/24/13 1126  Weight: 88.3 kg (194 lb 10.7 oz) 87.1 kg (192 lb 0.3 oz) 85.1 kg (187 lb 9.8 oz)    General appearance: no distress and eyes open. Not responding. Resp: Much improved air entry bilaterally. Few crackles at bases. No wheezing. Cardio: regular rate and rhythm, S1, S2 normal, no murmur, click, rub or gallop GI: soft, non-tender; bowel sounds  normal; no masses,  no organomegaly and PEG noted. Suprapubic catheter noted as well. Extremities: Multiple wounds noted in LE covered with dressings. Neurologic: Severe MS with contracted extremities.  Lab Results:  Basic Metabolic Panel:  Recent Labs Lab 01/20/13 1959 01/21/13 0405 01/22/13 0241 01/23/13 0254  NA 138 139 139 142  K 4.3 4.2 4.4 3.6*  CL 97 101 101 102  CO2 23 21 23 26   GLUCOSE 108* 78 80 90  BUN 13 13 11 11   CREATININE 0.61 0.72 0.82 0.78  CALCIUM 9.7 9.2 8.7 9.0   Liver Function Tests:  Recent Labs Lab 01/20/13 1959 01/21/13 0405  AST 24 18  ALT 19 17  ALKPHOS 149* 140*  BILITOT 0.3 0.4  PROT 9.1* 8.0  ALBUMIN 3.7 3.4*    Recent Labs Lab 01/20/13 1959  LIPASE 29   CBC:  Recent Labs Lab 01/20/13 1959 01/21/13 0405 01/22/13 0241 01/23/13 0254  WBC 27.9* 21.4* 16.2* 10.8*  NEUTROABS 26.0* 18.2*  --   --   HGB 14.3 12.8* 11.4* 11.3*  HCT 41.9 38.6* 34.6* 34.4*  MCV 83.8 84.6 87.4 87.1  PLT 392 392 346 347   CBG:  Recent Labs Lab 01/23/13 0003 01/23/13 0359 01/23/13 0752 01/23/13 1136 01/24/13 0828  GLUCAP 110* 105* 153* 106* 120*    Recent  Results (from the past 240 hour(s))  URINE CULTURE     Status: None   Collection Time    01/20/13  7:59 PM      Result Value Range Status   Specimen Description URINE, CATHETERIZED   Final   Special Requests NONE   Final   Culture  Setup Time     Final   Value: 01/21/2013 00:59     Performed at [facility]     Final   Value: NO GROWTH     Performed at [facility]   Culture     Final   Value: NO GROWTH     Performed at [facility]   Report Status 01/21/2013 FINAL   Final  CULTURE, BLOOD (ROUTINE X 2)     Status: None   Collection Time    01/20/13  9:47 PM      Result Value Range Status   Specimen Description BLOOD RIGHT HAND   Final   Special Requests BOTTLES DRAWN AEROBIC ONLY 1ML   Final   Culture  Setup Time     Final   Value:  01/21/2013 02:14     Performed at [facility]   Culture     Final   Value:        BLOOD CULTURE RECEIVED NO GROWTH TO DATE CULTURE WILL BE HELD FOR 5 DAYS BEFORE ISSUING A FINAL NEGATIVE REPORT     Performed at [facility]   Report Status PENDING   Incomplete  CULTURE, BLOOD (ROUTINE X 2)     Status: None   Collection Time    01/20/13  9:54 PM      Result Value Range Status   Specimen Description BLOOD BLOOD RIGHT FOREARM   Final   Special Requests BOTTLES DRAWN AEROBIC ONLY 3CC   Final   Culture  Setup Time     Final   Value: 01/21/2013 02:14     Performed at [facility]   Culture     Final   Value:        BLOOD CULTURE RECEIVED NO GROWTH TO DATE CULTURE WILL BE HELD FOR 5 DAYS BEFORE ISSUING A FINAL NEGATIVE REPORT     Performed at [facility]   Report Status PENDING   Incomplete  MRSA PCR SCREENING     Status: None   Collection Time    01/21/13  3:52 AM      Result Value Range Status   MRSA by PCR NEGATIVE  NEGATIVE Final   Comment:            The GeneXpert MRSA Assay (FDA     approved for NASAL specimens     only), is one component of a     comprehensive MRSA colonization     surveillance program. It is not     intended to diagnose MRSA     infection nor to guide or     monitor treatment for     MRSA infections.      Studies/Results: No results found.  Medications:  Scheduled: . ampicillin-sulbactam (UNASYN) IV  1.5 g Intravenous Q6H  . baclofen  20 mg Per Tube TID  . buPROPion  75 mg Oral TID  . collagenase   Topical Daily  . diazepam  5 mg Per Tube QHS  . docusate  100 mg Per Tube Daily  . feeding supplement (OSMOLITE 1.5 CAL)  1,000 mL Per Tube Q24H  .  feeding supplement (PRO-STAT SUGAR FREE 64)  30 mL Per Tube TID  . free water  240 mL Per Tube Q4H  . levETIRAcetam  750 mg Per Tube BID  . multivitamin  5 mL Per Tube Daily  . pantoprazole sodium  40 mg Per Tube BID  . vitamin C  500 mg Per Tube BID   Continuous:    [id number], diphenhydrAMINE, levalbuterol, polyethylene glycol  Assessment/Plan:  Principal Problem:   Sepsis Active Problems:   Multiple sclerosis   Bowel obstruction   Aspiration pneumonia   Hematemesis    Duodenal Obstruction from PEG Balloon Repositioned in ED. Seen by general surgery as they placed the PEG in 2013. PEG appears to be working well at this time. Abdomen not distended. Tolerating feeds well.  Hematemesis Likely due to malpositioned PEG with irritation of mucosa versus mallory wies. Hgb stable. Continue PPI.  Aspiration PNA Cultures negative. Continue Unasyn for now. Will transition to Augmentin on discharge. Saturating well. Nebs. WBC is better.   Nutrition PEG feeds resumed and seems to be tolerating well. Increase rate.   Advanced MS Poor long term prognosis.   History of Seizure Disorder On Keppra  Code Status: Full Code.  DVT Prophylaxis: SCD's    Family Communication: No family at bedside. Discussed with wife 1/16. Disposition Plan: Is from SNF. But family wants to take him home. CM following. Anticipate discharge 1/19 as family trying to get all DME in place at home.    LOS: 4 days   [facility] Pager [phone] 01/24/2013, 12:46 PM  If 8PM-8AM, please contact night-coverage at [email or web address], password [facility]

## 2013-01-24 NOTE — ED Provider Notes (Signed)
I saw and evaluated the patient, reviewed the resident's note and I agree with the findings and plan.  EKG Interpretation    Date/Time:    Ventricular Rate:    PR Interval:    QRS Duration:   QT Interval:    QTC Calculation:   R Axis:     Text Interpretation:                 Tanna Furry, MD 01/24/13 [phone]

## 2013-01-24 NOTE — Progress Notes (Signed)
Physical Therapy Wound Treatment Patient Details  Name: Terry Palmer MRN: [id number] Date of Birth: 09/01/1975  Today's Date: 01/24/2013 Time: 2836-6294 Time Calculation (min): 27 min  Subjective  Subjective: Nonverbal  Pain Score:    Wound Assessment  Pressure Ulcer 03/12/11 Stage II -  Partial thickness loss of dermis presenting as a shallow open ulcer with a red, pink wound bed without slough. 3 x 4.5 no tunneling  (Active)     Pressure Ulcer 02/12/11 Stage II -  Partial thickness loss of dermis presenting as a shallow open ulcer with a red, pink wound bed without slough. .5 x 1  (Active)     Pressure Ulcer 05/27/11 Stage II -  Partial thickness loss of dermis presenting as a shallow open ulcer with a red, pink wound bed without slough. (Active)     Pressure Ulcer 01/29/12 Stage II -  Partial thickness loss of dermis presenting as a shallow open ulcer with a red, pink wound bed without slough. (Active)     Pressure Ulcer 12/12/12 Stage II -  Partial thickness loss of dermis presenting as a shallow open ulcer with a red, pink wound bed without slough. entire buttocks from coccyx down to upper thigh. aprox. 12in by 6 in.  (Active)     Pressure Ulcer 12/12/12 Stage II -  Partial thickness loss of dermis presenting as a shallow open ulcer with a red, pink wound bed without slough. On lower buttocks aprox. 3in by 5in (Active)     Pressure Ulcer 12/12/12 Unstageable - Full thickness tissue loss in which the base of the ulcer is covered by slough (yellow, tan, gray, green or brown) and/or eschar (tan, brown or black) in the wound bed. On left heel, approx. 4X5cm (Active)     Pressure Ulcer 12/12/12 Unstageable - Full thickness tissue loss in which the base of the ulcer is covered by slough (yellow, tan, gray, green or brown) and/or eschar (tan, brown or black) in the wound bed. (Active)     Pressure Ulcer 01/21/13 Stage II -  Partial thickness loss of dermis presenting as a shallow open  ulcer with a red, pink wound bed without slough. stage II (Active)  State of Healing Epithelialized 01/22/2013  9:00 AM  Site / Wound Assessment Bleeding;Pink;Brown 01/22/2013  9:00 AM  Peri-wound Assessment Erythema (blanchable) 01/22/2013  9:00 AM  Wound Length (cm) 2 cm 01/21/2013  2:08 AM  Wound Width (cm) 2 cm 01/21/2013  2:08 AM  Margins Unattacted edges (unapproximated) 01/22/2013  9:00 AM  Drainage Amount None 01/22/2013  9:00 AM  Dressing Type Foam 01/23/2013 10:51 PM  Dressing Clean;Dry;Intact 01/23/2013 10:51 PM     Pressure Ulcer 01/21/13 Stage III -  Full thickness tissue loss. Subcutaneous fat may be visible but bone, tendon or muscle are NOT exposed. (Active)  State of Healing Epithelialized 01/22/2013  9:00 AM  Site / Wound Assessment Yellow;Pink;Red 01/22/2013  9:00 AM  % Wound base Red or Granulating 50% 01/22/2013  9:00 AM  % Wound base Yellow 50% 01/22/2013  9:00 AM  Peri-wound Assessment Erythema (blanchable) 01/22/2013  9:00 AM  Margins Unattacted edges (unapproximated) 01/22/2013  9:00 AM  Drainage Amount None 01/22/2013  9:00 AM  Dressing Type Foam 01/23/2013 10:51 PM  Dressing Clean;Dry;Intact 01/23/2013 10:51 PM     Pressure Ulcer 01/21/13 Stage III -  Full thickness tissue loss. Subcutaneous fat may be visible but bone, tendon or muscle are NOT exposed. (Active)  State of Healing Epithelialized 01/22/2013  9:00  AM  Site / Wound Assessment Red;Yellow 01/22/2013  9:00 AM  % Wound base Red or Granulating 50% 01/22/2013  9:00 AM  % Wound base Yellow 50% 01/22/2013  9:00 AM  Peri-wound Assessment Purple;Black 01/22/2013  9:00 AM  Wound Length (cm) 3.5 cm 01/21/2013  2:08 AM  Wound Width (cm) 3.5 cm 01/21/2013  2:08 AM  Margins Unattacted edges (unapproximated) 01/22/2013  9:00 AM  Drainage Amount Scant 01/22/2013  9:00 AM  Drainage Description Serosanguineous 01/22/2013  9:00 AM  Dressing Type Foam 01/23/2013 10:51 PM  Dressing Clean;Dry;Intact 01/23/2013 10:51 PM     Pressure Ulcer  01/21/13 Unstageable - Full thickness tissue loss in which the base of the ulcer is covered by slough (yellow, tan, gray, green or brown) and/or eschar (tan, brown or black) in the wound bed. (Active)  State of Healing Eschar 01/24/2013 10:44 AM  Site / Wound Assessment Black;Brown;Pink 01/24/2013 10:44 AM  % Wound base Red or Granulating 10% 01/24/2013 10:44 AM  % Wound base Yellow 0% 01/24/2013 10:44 AM  % Wound base Black 90% 01/24/2013 10:44 AM  % Wound base Other (Comment) 0% 01/24/2013 10:44 AM  Peri-wound Assessment Intact 01/24/2013 10:44 AM  Wound Length (cm) 7 cm 01/22/2013  9:00 AM  Wound Width (cm) 6 cm 01/22/2013  9:00 AM  Wound Depth (cm) 0.1 cm 01/22/2013  9:00 AM  Margins Unattacted edges (unapproximated) 01/24/2013 10:44 AM  Drainage Amount Minimal 01/24/2013 10:44 AM  Drainage Description Serosanguineous 01/24/2013 10:44 AM  Treatment Hydrotherapy (Pulse lavage) 01/23/2013 10:12 AM  Dressing Type Moist to dry;Gauze (Comment);Foam 01/24/2013 10:44 AM  Dressing Changed 01/24/2013 10:44 AM     Wound 12/14/12 Other (Comment) Penis Posterior;Medial two inches skin separation/ tissue red/no drainage (Active)     Wound 01/21/13  Coccyx Posterior (Active)  Site / Wound Assessment Pink 01/22/2013  9:00 AM  Peri-wound Assessment Intact;Erythema (blanchable) 01/22/2013  9:00 AM  Margins Attached edges (approximated) 01/22/2013  9:00 AM  Drainage Amount None 01/22/2013  9:00 AM  Dressing Type Foam 01/23/2013  7:49 AM  Dressing Changed New 01/21/2013  2:08 AM  Dressing Status Clean;Dry;Intact 01/23/2013  7:49 AM     Wound 01/21/13 Abrasion(s) Leg Right;Posterior;Distal (Active)  Site / Wound Assessment Pink 01/22/2013  9:00 AM  Margins Attached edges (approximated) 01/22/2013  9:00 AM  Drainage Amount None 01/22/2013  9:00 AM  Dressing Type Foam 01/23/2013  7:49 AM  Dressing Changed New 01/21/2013  2:08 AM  Dressing Status Clean;Dry;Intact 01/23/2013  7:49 AM     Incision 04/16/11 Abdomen Other  (Comment) (Active)     Incision 04/16/11 Other (Comment) Left (Active)   Hydrotherapy Pulsed lavage therapy - wound location: lt trochanter Pulsed Lavage with Suction (psi): 12 psi Pulsed Lavage with Suction - Normal Saline Used: 1000 mL Pulsed Lavage Tip: Tip with splash shield Selective Debridement Selective Debridement - Location: lt trochanter Selective Debridement - Tools Used: Scalpel;Other (comment) (used to cross-hatch prior to santyl) Selective Debridement - Tissue Removed: scored eschar   Wound Assessment and Plan  Wound Therapy - Assess/Plan/Recommendations Wound Therapy - Clinical Statement: No change in wound.  Hydrotherapy Plan: Debridement;Dressing change;Patient/family education;Pulsatile lavage with suction Wound Therapy - Frequency: 6X / week Wound Therapy - Follow Up Recommendations: Home health RN Wound Plan: See above  Wound Therapy Goals- Improve the function of patient's integumentary system by progressing the wound(s) through the phases of wound healing (inflammation - proliferation - remodeling) by: Decrease Necrotic Tissue to: 70% Decrease Necrotic Tissue - Progress: Progressing  toward goal Increase Granulation Tissue to: 30% Increase Granulation Tissue - Progress: Progressing toward goal Goals/treatment plan/discharge plan were made with and agreed upon by patient/family: No, Patient unable to participate in goals/treatment/discharge plan and family unavailable Time For Goal Achievement: 7 days Wound Therapy - Potential for Goals: Good  Goals will be updated until maximal potential achieved or discharge criteria met.  Discharge criteria: when goals achieved, discharge from hospital, MD decision/surgical intervention, no progress towards goals, refusal/missing three consecutive treatments without notification or medical reason.  GP     Geraldyn Shain, Tessie Fass 01/24/2013, 10:47 AM   01/24/2013  Donnella Sham, PT [phone]  (pager)

## 2013-01-25 LAB — CBC
HEMATOCRIT: 36.4 % — AB (ref 39.0–52.0)
Hemoglobin: 12.2 g/dL — ABNORMAL LOW (ref 13.0–17.0)
MCH: 29 pg (ref 26.0–34.0)
MCHC: 33.5 g/dL (ref 30.0–36.0)
MCV: 86.7 fL (ref 78.0–100.0)
Platelets: 376 10*3/uL (ref 150–400)
RBC: 4.2 MIL/uL — ABNORMAL LOW (ref 4.22–5.81)
RDW: 14.6 % (ref 11.5–15.5)
WBC: 7.9 10*3/uL (ref 4.0–10.5)

## 2013-01-25 LAB — BASIC METABOLIC PANEL
BUN: 12 mg/dL (ref 6–23)
CHLORIDE: 100 meq/L (ref 96–112)
CO2: 19 mEq/L (ref 19–32)
CREATININE: 0.62 mg/dL (ref 0.50–1.35)
Calcium: 9.1 mg/dL (ref 8.4–10.5)
Glucose, Bld: 110 mg/dL — ABNORMAL HIGH (ref 70–99)
Potassium: 3.9 mEq/L (ref 3.7–5.3)
Sodium: 137 mEq/L (ref 137–147)

## 2013-01-25 LAB — GLUCOSE, CAPILLARY
GLUCOSE-CAPILLARY: 116 mg/dL — AB (ref 70–99)
Glucose-Capillary: 100 mg/dL — ABNORMAL HIGH (ref 70–99)

## 2013-01-25 MED ORDER — ACETAMINOPHEN 325 MG PO TABS
650.0000 mg | ORAL_TABLET | Freq: Four times a day (QID) | ORAL | Status: DC | PRN
  Administered 2013-01-25 – 2013-01-26 (×2): 650 mg via ORAL
  Filled 2013-01-25 (×2): qty 2

## 2013-01-25 MED ORDER — AMOXICILLIN-POT CLAVULANATE 400-57 MG/5ML PO SUSR
800.0000 mg | Freq: Two times a day (BID) | ORAL | Status: DC
  Administered 2013-01-25 – 2013-01-26 (×3): 800 mg
  Filled 2013-01-25 (×4): qty 10

## 2013-01-25 NOTE — Progress Notes (Addendum)
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 He has been vomiting on/off for 2 weeks prior to admission per father who was present at admission. Father also mentioned that his abdomen had been more bloated as well.   Past medical history:  Past Medical History  Diagnosis Date  . MS (multiple sclerosis)   . 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 Childhood asthma   . Depression   . Neuromuscular disorder     Quadraperesis  . Recurrent UTI   . Dysphagia   . Bladder calculi   . Neurogenic bladder   . Shortness of breath   . Recurrent upper respiratory infection (URI)   . Normocytic anemia 05/28/2011  . Aspiration pneumonia 01/20/2013    Consultants: [facility], Gen Surg  Procedures: PEG tube was repositioned  Antibiotics: Vanc 1/13--> Zosyn 1/13-->  Subjective: Patient non verbal. No changes.  Objective: Vital Signs  Filed Vitals:   01/24/13 1126 01/24/13 1354 01/24/13 2116 01/25/13 0515  BP:  126/70 121/81 127/82  Pulse:  92 98 91  Temp:  98.9 F (37.2 C) 97.7 F (36.5 C) 98.1 F (36.7 C)  TempSrc:  Oral Axillary Oral  Resp:  18 18 18   Height:      Weight: 85.1 kg (187 lb 9.8 oz)   85.1 kg (187 lb 9.8 oz)  SpO2:  94% 98% 96%    Intake/Output Summary (Last 24 hours) at 01/25/13 1213 Last data filed at 01/25/13 0800  Gross per 24 hour  Intake 3917.83 ml  Output   2050 ml  Net 1867.83 ml   Filed Weights   01/22/13 0423 01/24/13 1126 01/25/13 0515  Weight: 87.1 kg (192 lb 0.3 oz) 85.1 kg (187 lb 9.8 oz) 85.1 kg (187 lb 9.8 oz)    General appearance: no distress and eyes open. Not responding. Resp: Much improved air entry bilaterally. Few crackles at bases. No wheezing. Cardio: regular rate and rhythm, S1, S2 normal, no murmur, click, rub or  gallop GI: soft, non-tender; bowel sounds normal; no masses,  no organomegaly and PEG noted. Suprapubic catheter noted as well. Extremities: Multiple wounds noted in LE covered with dressings. Neurologic: Severe MS with contracted extremities.  Lab Results:  Basic Metabolic Panel:  Recent Labs Lab 01/20/13 1959 01/21/13 0405 01/22/13 0241 01/23/13 0254 01/25/13 0510  NA 138 139 139 142 137  K 4.3 4.2 4.4 3.6* 3.9  CL 97 101 101 102 100  CO2 23 21 23 26 19   GLUCOSE 108* 78 80 90 110*  BUN 13 13 11 11 12   CREATININE 0.61 0.72 0.82 0.78 0.62  CALCIUM 9.7 9.2 8.7 9.0 9.1   Liver Function Tests:  Recent Labs Lab 01/20/13 1959 01/21/13 0405  AST 24 18  ALT 19 17  ALKPHOS 149* 140*  BILITOT 0.3 0.4  PROT 9.1* 8.0  ALBUMIN 3.7 3.4*    Recent Labs Lab 01/20/13 1959  LIPASE 29   CBC:  Recent Labs Lab 01/20/13 1959 01/21/13 0405 01/22/13 0241 01/23/13 0254 01/25/13 0510  WBC 27.9* 21.4* 16.2* 10.8* 7.9  NEUTROABS 26.0* 18.2*  --   --   --   HGB 14.3 12.8* 11.4* 11.3* 12.2*  HCT 41.9 38.6* 34.6* 34.4* 36.4*  MCV 83.8 84.6 87.4 87.1 86.7  PLT 392 392 346 347 376   CBG:  Recent Labs Lab 01/23/13 0752 01/23/13 1136 01/24/13 0828 01/24/13 1749 01/25/13 0746  GLUCAP 153* 106* 120* 102* 116*    Recent Results (from the past 240 hour(s))  URINE CULTURE     Status: None   Collection Time    01/20/13  7:59 PM      Result Value Range Status   Specimen Description URINE, CATHETERIZED   Final   Special Requests NONE   Final   Culture  Setup Time     Final   Value: 01/21/2013 00:59     Performed at [facility]     Final   Value: NO GROWTH     Performed at [facility]   Culture     Final   Value: NO GROWTH     Performed at [facility]   Report Status 01/21/2013 FINAL   Final  CULTURE, BLOOD (ROUTINE X 2)     Status: None   Collection Time    01/20/13  9:47 PM      Result Value Range Status   Specimen  Description BLOOD RIGHT HAND   Final   Special Requests BOTTLES DRAWN AEROBIC ONLY 1ML   Final   Culture  Setup Time     Final   Value: 01/21/2013 02:14     Performed at [facility]   Culture     Final   Value:        BLOOD CULTURE RECEIVED NO GROWTH TO DATE CULTURE WILL BE HELD FOR 5 DAYS BEFORE ISSUING A FINAL NEGATIVE REPORT     Performed at [facility]   Report Status PENDING   Incomplete  CULTURE, BLOOD (ROUTINE X 2)     Status: None   Collection Time    01/20/13  9:54 PM      Result Value Range Status   Specimen Description BLOOD BLOOD RIGHT FOREARM   Final   Special Requests BOTTLES DRAWN AEROBIC ONLY 3CC   Final   Culture  Setup Time     Final   Value: 01/21/2013 02:14     Performed at [facility]   Culture     Final   Value:        BLOOD CULTURE RECEIVED NO GROWTH TO DATE CULTURE WILL BE HELD FOR 5 DAYS BEFORE ISSUING A FINAL NEGATIVE REPORT     Performed at [facility]   Report Status PENDING   Incomplete  MRSA PCR SCREENING     Status: None   Collection Time    01/21/13  3:52 AM      Result Value Range Status   MRSA by PCR NEGATIVE  NEGATIVE Final   Comment:            The GeneXpert MRSA Assay (FDA     approved for NASAL specimens     only), is one component of a     comprehensive MRSA colonization     surveillance program. It is not     intended to diagnose MRSA     infection nor to guide or     monitor treatment for     MRSA infections.      Studies/Results: No results found.  Medications:  Scheduled: . ampicillin-sulbactam (UNASYN) IV  1.5 g Intravenous Q6H  . baclofen  20 mg Per Tube TID  . buPROPion  75 mg Oral TID  . collagenase   Topical Daily  . diazepam  5 mg Per  Tube QHS  . docusate  100 mg Per Tube Daily  . feeding supplement (PRO-STAT SUGAR FREE 64)  30 mL Per Tube TID  . free water  240 mL Per Tube Q4H  . levETIRAcetam  750 mg Per Tube BID  . multivitamin  5 mL Per Tube Daily  . pantoprazole sodium  40  mg Per Tube BID  . vitamin C  500 mg Per Tube BID   Continuous: . 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 Seen by general surgery as they placed the PEG in 2013. PEG appears to be working well at this time. Abdomen not distended. Tolerating feeds well.  Hematemesis Likely due to malpositioned PEG with irritation of mucosa versus mallory wies. Hgb stable. Continue PPI. No indication for endoscopy.  Aspiration PNA Cultures negative. Change to Augmentin Susp. Saturating well. Nebs. WBC is better.   Nutrition PEG feeds resumed and seems to be tolerating well. Patient has had tube feeding at least since 2013. It appears that bolus feeding was tried and he did not tolerate and hence needs continuous feeding. Sine he has advanced progressive MS it anticipated that he will need tube feedings for rest of his life.  Advanced MS Poor long term prognosis.   History of Seizure Disorder On Keppra  Code Status: Full Code.  DVT Prophylaxis: SCD's    Family Communication: No family at bedside. Discussed with wife 1/16. Disposition Plan: Is from SNF. But family wants to take him home. CM following. Anticipate discharge 1/19 as family trying to get all DME in place at home.    LOS: 5 days   [facility] Pager [phone] 01/25/2013, 12:13 PM  If 8PM-8AM, please contact night-coverage at [email or web address], password [facility]

## 2013-01-26 LAB — GLUCOSE, CAPILLARY
Glucose-Capillary: 98 mg/dL (ref 70–99)
Glucose-Capillary: 105 mg/dL — ABNORMAL HIGH (ref 70–99)

## 2013-01-26 MED ORDER — AMOXICILLIN-POT CLAVULANATE 400-57 MG/5ML PO SUSR: 800.0000 mg | Freq: Two times a day (BID) | ORAL | Status: DC

## 2013-01-26 NOTE — Progress Notes (Signed)
[facility] called [phone]) regarding Terry Palmer discharge. Noted and supplies to be delivered.  Left name/number for follow-up, questions, etc.

## 2013-01-26 NOTE — Progress Notes (Signed)
Received call from [facility] needing a dx code for the nebulizer in order for it to be approved.  Was not near a computer at the time.  Called back and spoke with different rep, Darrell, who said he would update the file with the dx code.  Also faxed signed tube feed order from Dr. Curly Rim to Ronan at [facility].  Await those approvals and delivery.  Wife will notify me of the DME delivery so that pt can then transfer via ambulance to the home.

## 2013-01-26 NOTE — Progress Notes (Signed)
Received call from [name] with [facility], the [facility]. She was currently at the pt's home with his wife. She requested copy of d/c summary with d/c meds listed. I faxed that to her at 1450pm. She stated that they are waiting for the tube feeds to be reviewed by a dietician and then to be delivered as well as approval and delivery of the nebulizer.  She stated that either she or the wife would call when everything is delivered so that pt can be transported home by ambulance.    I will gather the forms and reports necessary for patient to transport home by ambulance and explain process to bedside RN so that it can occur, even if it is after 1630 when CM has left for the day.  Also spoke with MD on phone and explained where we were in this process and he gave no new orders or info.

## 2013-01-26 NOTE — Discharge Summary (Signed)
Physician Discharge Summary   Patient ID: Terry Palmer MRN: [id number] DOB/AGE: 1975/07/28 38 y.o.  Admit date: 01/20/2013 Discharge date: 01/26/2013  PCP: Reymundo Poll, MD  DISCHARGE DIAGNOSES:  Principal Problem:   Sepsis Active Problems:   Multiple sclerosis   Bowel obstruction   Aspiration pneumonia   Hematemesis   RECOMMENDATIONS FOR OUTPATIENT FOLLOW UP: 1. 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 He has been vomiting on/off for 2 weeks prior to admission per father who was present at admission. Father also mentioned that his abdomen had been more bloated as well.   Consultations:  General surgery  GI  Procedures:  PEG tube was repositioned  HOSPITAL COURSE:   Duodenal Obstruction from PEG Balloon  This was rRepositioned in ED and also evaluated by general surgery as they placed the PEG in 2013. PEG appears to be working well at this time. Abdomen not distended. Tolerating feeds well.   Hematemesis  Likely due to malpositioned PEG with irritation of mucosa versus mallory weis. Hgb has remained stable. Continue PPI. No indication for endoscopy.   Aspiration PNA  Cultures negative. He was changed to Augmentin Susp from broad spectrum coverage. He is saturating well. WBC is better. He remains at constant risk of aspiration. Family understands this.   Nutrition  PEG feeds were resumed after tube was repositioned and seems to be tolerating well. Patient has had tube feeding at least since 2013. It appears that bolus feeding was tried and he did not tolerate  and hence needs continuous feeding. Sine he has advanced progressive MS it anticipated that he will need tube feedings for rest of his life.   Advanced MS  Poor long term prognosis. Seen by palliative medicine in past but family wishes to keep him Full Code.  History of Seizure Disorder  Continue Keppra.  He is improved but has guarded short term prognosis. He can be discharged home. Home health being arranged. Family wishes to take him home instead of sending back to SNF.    PERTINENT LABS:  The results of significant diagnostics from this hospitalization (including imaging, microbiology, ancillary and laboratory) are listed below for reference.    Microbiology: Recent Results (from the past 240 hour(s))  URINE CULTURE     Status: None   Collection Time    01/20/13  7:59 PM      Result Value Range Status   Specimen Description URINE, CATHETERIZED   Final   Special Requests NONE   Final   Culture  Setup Time     Final   Value: 01/21/2013 00:59     Performed at [facility]     Final   Value: NO GROWTH     Performed at [facility]   Culture     Final   Value: NO GROWTH     Performed at [facility]   Report Status 01/21/2013 FINAL   Final  CULTURE, BLOOD (ROUTINE X 2)     Status: None   Collection Time    01/20/13  9:47 PM      Result Value Range Status   Specimen Description BLOOD RIGHT HAND   Final   Special Requests BOTTLES DRAWN AEROBIC ONLY 1ML   Final   Culture  Setup Time     Final   Value: 01/21/2013 02:14     Performed at [facility]   Culture     Final   Value:        BLOOD CULTURE RECEIVED NO GROWTH TO DATE CULTURE WILL BE HELD FOR 5 DAYS BEFORE ISSUING A FINAL NEGATIVE REPORT     Performed at [facility]   Report Status PENDING   Incomplete  CULTURE, BLOOD (ROUTINE X 2)     Status: None   Collection Time    01/20/13  9:54 PM      Result Value Range Status   Specimen Description BLOOD BLOOD RIGHT  FOREARM   Final   Special Requests BOTTLES DRAWN AEROBIC ONLY 3CC   Final   Culture  Setup Time     Final   Value: 01/21/2013 02:14     Performed at [facility]   Culture     Final   Value:        BLOOD CULTURE RECEIVED NO GROWTH TO DATE CULTURE WILL BE HELD FOR 5 DAYS BEFORE ISSUING A FINAL NEGATIVE REPORT     Performed at [facility]   Report Status PENDING   Incomplete  MRSA PCR SCREENING     Status: None   Collection Time    01/21/13  3:52 AM      Result Value Range Status   MRSA by PCR NEGATIVE  NEGATIVE Final   Comment:            The GeneXpert MRSA Assay (FDA     approved for NASAL specimens     only), is one component of a     comprehensive MRSA colonization     surveillance program. It is not     intended to diagnose MRSA     infection nor to guide or     monitor treatment for     MRSA infections.     Labs: Basic Metabolic Panel:  Recent Labs Lab 01/20/13 1959 01/21/13 0405 01/22/13 0241 01/23/13 0254 01/25/13 0510  NA 138 139 139 142 137  K 4.3 4.2 4.4 3.6* 3.9  CL 97 101 101 102 100  CO2 23 21 23 26 19   GLUCOSE 108* 78 80 90 110*  BUN 13 13 11 11 12   CREATININE 0.61 0.72 0.82 0.78 0.62  CALCIUM 9.7 9.2 8.7 9.0 9.1   Liver Function Tests:  Recent Labs Lab 01/20/13 1959 01/21/13 0405  AST 24 18  ALT 19 17  ALKPHOS 149* 140*  BILITOT 0.3 0.4  PROT 9.1* 8.0  ALBUMIN 3.7 3.4*    Recent Labs Lab 01/20/13 1959  LIPASE 29   CBC:  Recent Labs Lab 01/20/13 1959 01/21/13 0405 01/22/13 0241 01/23/13 0254 01/25/13 0510  WBC 27.9* 21.4* 16.2* 10.8* 7.9  NEUTROABS 26.0* 18.2*  --   --   --   HGB 14.3 12.8* 11.4* 11.3* 12.2*  HCT 41.9 38.6* 34.6* 34.4* 36.4*  MCV 83.8 84.6 87.4 87.1 86.7  PLT 392 392 346 347 376   CBG:  Recent Labs Lab 01/24/13 0828 01/24/13 1749 01/25/13 0746 01/25/13 1737 01/26/13 0815  GLUCAP 120* 102* 116* 100* 98     IMAGING STUDIES Dg Chest 1 View  01/20/2013  CLINICAL DATA:  Vomiting  for 2 days.  EXAM: CHEST - 1 VIEW  COMPARISON:  Single view of the chest 10/28/2012.  FINDINGS: Lung volumes are low but the lungs are clear. Heart size is normal. No pneumothorax or pleural effusion.  IMPRESSION: No acute disease.   Electronically Signed   By: Inge Rise M.D.   On: 01/20/2013 21:15   Ct Abdomen Pelvis W Contrast  01/20/2013   CLINICAL DATA:  Hematemesis.  EXAM: CT ABDOMEN AND PELVIS WITH CONTRAST  TECHNIQUE: Multidetector CT imaging of the abdomen and pelvis was performed using the standard protocol following bolus administration of intravenous contrast.  CONTRAST:  158mL OMNIPAQUE IOHEXOL 300 MG/ML  SOLN  COMPARISON:  10/28/2012  FINDINGS: BODY WALL: Unremarkable.  LOWER CHEST: Severe circumferential distal esophageal thickening. The thickening is predominant low-density, compatible with edema. Finding is new from 10/28/2012, in favor of an inflammatory process. There are scattered reticular nodular and ground-glass opacities in the lower lungs. The opacities are could be atelectasis or low volume aspiration.  ABDOMEN/PELVIS:  Liver: No focal abnormality.  Biliary: No evidence of biliary obstruction or stone.  Pancreas: Unremarkable.  Spleen: Unremarkable.  Adrenals: Unremarkable.  Kidneys and ureters: Apparent hypoenhancement in the upper left kidney is most likely streak artifact. No hydronephrosis.  Bladder: There is a suprapubic catheter indwelling the thick walled bladder, limiting further assessment.  Reproductive: Unremarkable.  Bowel: Percutaneous gastrostomy. Gastrostomy tube is deep, with the retention balloon at the level of the duodenal bulb. The stomach is distended and fluid-filled. No inflammatory changes at the level of the duodenum. Normal appendix.  Retroperitoneum: No mass or adenopathy.  Peritoneum: No free fluid or gas.  Vascular: No acute abnormality.  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 There is related degenerative changes at the contact point between the femoral heads and posterior acetabular rims.  IMPRESSION: 1. Severe distal esophagitis, new from 10/28/2012. 2. Deep percutaneous gastrostomy tube, with retention balloon causing relative obstruction at the level of the duodenal bulb. This can be corrected with catheter retraction and repositioning of the retention disc. 3. Possible low volume aspiration in the lower lungs.   Electronically Signed   By: Jorje Guild M.D.   On: 01/20/2013 21:38    DISCHARGE EXAMINATION: Filed Vitals:   01/25/13 0515 01/25/13 1350 01/25/13 2035 01/26/13 0711  BP: 127/82 124/77 120/77 106/68  Pulse: 91 94 102 110  Temp: 98.1 F (36.7 C) 98.4 F (36.9 C) 98.8 F (37.1 C) 98.9 F (37.2 C)  TempSrc: Oral Oral Oral Axillary  Resp: 18 18 18 18   Height:      Weight: 85.1 kg (187 lb 9.8 oz)   95.3 kg (210 lb 1.6 oz)  SpO2: 96% 98% 95% 100%   General appearance: alert, cooperative, appears stated age and no distress Resp: clear to auscultation bilaterally Cardio: regular rate and rhythm, S1, S2 normal, no murmur, click, rub or gallop GI: soft, non-tender; bowel sounds normal; no masses, no organomegaly and PEG noted. Suprapubic catheter noted as well.  Extremities: Multiple wounds noted in LE covered with dressings.  Neurologic: Severe MS with contracted extremities.   DISPOSITION: Home with home health   Future Appointments Provider Department Dept Phone   04/16/2013 9:00 AM Kathrynn Ducking, MD [facility] [phone]      ALLERGIES: No Known Allergies  Current Discharge Medication List    START taking these medications   Details  Amino Acids-Protein Hydrolys (FEEDING SUPPLEMENT, PRO-STAT SUGAR FREE 64,) LIQD Place 30 mLs into feeding tube 3 (three) times daily. Qty: 900 mL, Refills: 0    amoxicillin-clavulanate (AUGMENTIN) 400-57 MG/5ML suspension Place 10 mLs (800 mg total) into feeding tube  every 12 (twelve) hours. For 7 days Qty: 140 mL, Refills: 0    omeprazole (PRILOSEC) 2 mg/mL SUSP Place 20 mLs (40 mg total) into feeding tube daily. Qty: 600 mL, Refills: 1      CONTINUE these medications which have CHANGED   Details  ascorbic acid (VITAMIN C) 500 MG/5ML syrup Place 5 mLs (500 mg total) into feeding tube 2 (two) times daily. Qty: 473 mL, Refills: 1    baclofen (LIORESAL) 20 MG tablet Place 1 tablet (20 mg total) into feeding tube 3 (three) times daily. Qty: 90 each, Refills: 1    buPROPion (WELLBUTRIN) 75 MG tablet Place 1 tablet (75 mg total) into feeding tube 3 (three) times daily. Qty: 90 tablet, Refills: 1    cholecalciferol (VITAMIN D) 1000 UNITS tablet Take 1 tablet (1,000 Units total) by mouth 2 (two) times daily. Qty: 60 tablet, Refills: 1    collagenase (SANTYL) ointment Apply 1 application topically every other day. Qty: 15 g, Refills: 0    Cranberry Juice Powder 425 MG CAPS 1 capsule (425 mg total) by PEG Tube route 2 (two) times daily. Qty: 90 each, Refills: 0    diazepam (VALIUM) 5 MG tablet Place 1 tablet (5 mg total) into feeding tube at bedtime. Qty: 30 tablet, Refills: 0    docusate (COLACE) 50 MG/5ML liquid Place 10 mLs (100 mg total) into feeding tube daily. Qty: 480 mL, Refills: 0    levETIRAcetam (KEPPRA) 100 MG/ML solution Place 7.5 mLs (750 mg total) into feeding tube 2 (two) times daily. Qty: 473 mL, Refills: 1    loratadine (CLARITIN) 10 MG tablet Place 1 tablet (10 mg total) into feeding tube daily. Qty: 30 tablet, Refills: 0    Menthol-Zinc Oxide (CALMOSEPTINE) 0.44-20.625 % OINT Apply small qty to penis twice daily Qty: 71 g, Refills: 0    Multiple Vitamin (MULTIVITAMIN WITH MINERALS) TABS tablet Place 1 tablet into feeding tube daily. Qty: 30 tablet, Refills: 0    Nutritional Supplements (FEEDING SUPPLEMENT, OSMOLITE 1.5 CAL,) LIQD Place 1,000 mLs into feeding tube continuous. At 53ml/HR Qty: 1 Bottle, Refills: 10      nystatin (MYCOSTATIN/NYSTOP) 100000 UNIT/GM POWD Apply 1 g topically 3 (three) times daily. Qty: 15 g, Refills: 0    polyethylene glycol (MIRALAX / GLYCOLAX) packet Place 17 g into feeding tube daily. Qty: 30 each, Refills: 1    polyvinyl alcohol (LIQUIFILM TEARS) 1.4 % ophthalmic solution Place 1 drop into both eyes 3 (three) times daily. Qty: 15 mL, Refills: 0    traMADol (ULTRAM) 50 MG tablet Place 1 tablet (50 mg total) into feeding tube every 8 (eight) hours as needed. Qty: 30 tablet, Refills: 0    Water For Irrigation, Sterile (FREE WATER) SOLN Place 240 mLs into feeding tube every 4 (four) hours. Qty: 1000 mL, Refills: 1      CONTINUE these medications which have NOT CHANGED   Details  Garlic Oil (ODORLESS GARLIC) 161 MG TABS 096 mg by PEG Tube route daily.       STOP taking these medications     Cranberry-Vitamin C-Inulin (UTI-STAT) LIQD        Follow-up Information   Call Reymundo Poll, MD. (As needed)    Specialty:  Family Medicine  Contact information:   [address] [place]  [address] [phone]       TOTAL DISCHARGE TIME: 35 mins  [facility]  [facility] Pager [phone]  01/26/2013, 1:25 PM

## 2013-01-26 NOTE — Progress Notes (Signed)
Physical Therapy Wound Treatment Patient Details  Name: MARVYN TORREZ MRN: [id number] Date of Birth: 02-15-1975  Today's Date: 01/26/2013 Time:  -     Subjective  Subjective: Nonverbal  Pain Score:    Wound Assessment  Pressure Ulcer 03/12/11 Stage II -  Partial thickness loss of dermis presenting as a shallow open ulcer with a red, pink wound bed without slough. 3 x 4.5 no tunneling  (Active)     Pressure Ulcer 02/12/11 Stage II -  Partial thickness loss of dermis presenting as a shallow open ulcer with a red, pink wound bed without slough. .5 x 1  (Active)     Pressure Ulcer 05/27/11 Stage II -  Partial thickness loss of dermis presenting as a shallow open ulcer with a red, pink wound bed without slough. (Active)     Pressure Ulcer 01/29/12 Stage II -  Partial thickness loss of dermis presenting as a shallow open ulcer with a red, pink wound bed without slough. (Active)     Pressure Ulcer 12/12/12 Stage II -  Partial thickness loss of dermis presenting as a shallow open ulcer with a red, pink wound bed without slough. entire buttocks from coccyx down to upper thigh. aprox. 12in by 6 in.  (Active)     Pressure Ulcer 12/12/12 Stage II -  Partial thickness loss of dermis presenting as a shallow open ulcer with a red, pink wound bed without slough. On lower buttocks aprox. 3in by 5in (Active)     Pressure Ulcer 12/12/12 Unstageable - Full thickness tissue loss in which the base of the ulcer is covered by slough (yellow, tan, gray, green or brown) and/or eschar (tan, brown or black) in the wound bed. On left heel, approx. 4X5cm (Active)     Pressure Ulcer 12/12/12 Unstageable - Full thickness tissue loss in which the base of the ulcer is covered by slough (yellow, tan, gray, green or brown) and/or eschar (tan, brown or black) in the wound bed. (Active)     Pressure Ulcer 01/21/13 Stage II -  Partial thickness loss of dermis presenting as a shallow open ulcer with a red, pink wound bed  without slough. stage II (Active)  State of Healing Epithelialized 01/22/2013  9:00 AM  Site / Wound Assessment Bleeding;Pink;Brown 01/22/2013  9:00 AM  Peri-wound Assessment Erythema (blanchable) 01/22/2013  9:00 AM  Wound Length (cm) 2 cm 01/21/2013  2:08 AM  Wound Width (cm) 2 cm 01/21/2013  2:08 AM  Margins Unattacted edges (unapproximated) 01/22/2013  9:00 AM  Drainage Amount None 01/22/2013  9:00 AM  Dressing Type Foam 01/25/2013  8:30 PM  Dressing Clean;Dry;Intact 01/25/2013  8:30 PM     Pressure Ulcer 01/21/13 Stage III -  Full thickness tissue loss. Subcutaneous fat may be visible but bone, tendon or muscle are NOT exposed. (Active)  State of Healing Epithelialized 01/22/2013  9:00 AM  Site / Wound Assessment Yellow;Pink;Red 01/22/2013  9:00 AM  % Wound base Red or Granulating 50% 01/22/2013  9:00 AM  % Wound base Yellow 50% 01/22/2013  9:00 AM  Peri-wound Assessment Erythema (blanchable) 01/22/2013  9:00 AM  Margins Unattacted edges (unapproximated) 01/22/2013  9:00 AM  Drainage Amount None 01/22/2013  9:00 AM  Dressing Type Foam 01/25/2013  8:30 PM  Dressing Clean;Dry;Intact 01/25/2013  8:30 PM     Pressure Ulcer 01/21/13 Stage III -  Full thickness tissue loss. Subcutaneous fat may be visible but bone, tendon or muscle are NOT exposed. (Active)  State of Healing Epithelialized  01/22/2013  9:00 AM  Site / Wound Assessment Red;Yellow 01/22/2013  9:00 AM  % Wound base Red or Granulating 50% 01/22/2013  9:00 AM  % Wound base Yellow 50% 01/22/2013  9:00 AM  Peri-wound Assessment Purple;Black 01/22/2013  9:00 AM  Wound Length (cm) 3.5 cm 01/21/2013  2:08 AM  Wound Width (cm) 3.5 cm 01/21/2013  2:08 AM  Margins Unattacted edges (unapproximated) 01/22/2013  9:00 AM  Drainage Amount Scant 01/22/2013  9:00 AM  Drainage Description Serosanguineous 01/22/2013  9:00 AM  Dressing Type Foam 01/25/2013  8:30 PM  Dressing Clean;Dry;Intact 01/25/2013  8:30 PM     Pressure Ulcer 01/21/13 Unstageable - Full thickness  tissue loss in which the base of the ulcer is covered by slough (yellow, tan, gray, green or brown) and/or eschar (tan, brown or black) in the wound bed. (Active)  State of Healing Eschar 01/26/2013 10:48 AM  Site / Wound Assessment Black;Brown;Pink 01/26/2013 10:48 AM  % Wound base Red or Granulating 10% 01/26/2013 10:48 AM  % Wound base Yellow 0% 01/26/2013 10:48 AM  % Wound base Black 90% 01/26/2013 10:48 AM  % Wound base Other (Comment) 0% 01/26/2013 10:48 AM  Peri-wound Assessment Intact 01/26/2013 10:48 AM  Wound Length (cm) 7 cm 01/22/2013  9:00 AM  Wound Width (cm) 6 cm 01/22/2013  9:00 AM  Wound Depth (cm) 0.1 cm 01/22/2013  9:00 AM  Margins Unattacted edges (unapproximated) 01/26/2013 10:48 AM  Drainage Amount Minimal 01/26/2013 10:48 AM  Drainage Description Serosanguineous 01/26/2013 10:48 AM  Treatment Hydrotherapy (Pulse lavage) 01/23/2013 10:12 AM  Dressing Type Moist to dry;Gauze (Comment);Foam 01/26/2013 10:48 AM  Dressing Changed 01/26/2013 10:48 AM     Wound 12/14/12 Other (Comment) Penis Posterior;Medial two inches skin separation/ tissue red/no drainage (Active)     Wound 01/21/13  Coccyx Posterior (Active)  Site / Wound Assessment Pink 01/22/2013  9:00 AM  Peri-wound Assessment Intact;Erythema (blanchable) 01/22/2013  9:00 AM  Margins Attached edges (approximated) 01/22/2013  9:00 AM  Drainage Amount None 01/22/2013  9:00 AM  Dressing Type Foam 01/25/2013  8:30 PM  Dressing Changed New 01/21/2013  2:08 AM  Dressing Status Clean;Dry;Intact 01/25/2013  8:30 PM     Wound 01/21/13 Abrasion(s) Leg Right;Posterior;Distal (Active)  Site / Wound Assessment Pink 01/22/2013  9:00 AM  Margins Attached edges (approximated) 01/22/2013  9:00 AM  Drainage Amount None 01/22/2013  9:00 AM  Dressing Type Foam 01/25/2013  8:30 PM  Dressing Changed New 01/21/2013  2:08 AM  Dressing Status Clean;Dry;Intact 01/25/2013  8:30 PM     Incision 04/16/11 Abdomen Other (Comment) (Active)     Incision 04/16/11  Other (Comment) Left (Active)   Hydrotherapy Pulsed lavage therapy - wound location: lt trochanter Pulsed Lavage with Suction (psi): 12 psi Pulsed Lavage with Suction - Normal Saline Used: 1000 mL Pulsed Lavage Tip: Tip with splash shield Selective Debridement Selective Debridement - Location: lt trochanter Selective Debridement - Tools Used: Scalpel;Other (comment) (used to cross-hatch prior to santyl) Selective Debridement - Tissue Removed: scored eschar   Wound Assessment and Plan  Wound Therapy - Assess/Plan/Recommendations Wound Therapy - Clinical Statement: Eschar is thin and strongly adhered, scoring beginning to slough up Wound Therapy - Functional Problem List: Severe contractures due to MS limiting mobility Factors Delaying/Impairing Wound Healing: Immobility;Multiple medical problems;Polypharmacy Hydrotherapy Plan: Debridement;Dressing change;Patient/family education;Pulsatile lavage with suction Wound Therapy - Frequency: 6X / week Wound Therapy - Follow Up Recommendations: Home health RN Wound Plan: See above  Wound Therapy Goals- Improve the function  of patient's integumentary system by progressing the wound(s) through the phases of wound healing (inflammation - proliferation - remodeling) by: Decrease Necrotic Tissue to: 70% Decrease Necrotic Tissue - Progress: Progressing toward goal (very slowly starting to break down tissue) Increase Granulation Tissue to: 30% Increase Granulation Tissue - Progress: Progressing toward goal Goals/treatment plan/discharge plan were made with and agreed upon by patient/family: No, Patient unable to participate in goals/treatment/discharge plan and family unavailable Time For Goal Achievement: 7 days Wound Therapy - Potential for Goals: Good  Goals will be updated until maximal potential achieved or discharge criteria met.  Discharge criteria: when goals achieved, discharge from hospital, MD decision/surgical intervention, no progress  towards goals, refusal/missing three consecutive treatments without notification or medical reason.  GP     Alaiah Lundy, Tessie Fass 01/26/2013, 10:51 AM 01/26/2013  Donnella Sham, PT [phone]  (pager)

## 2013-01-27 LAB — CULTURE, BLOOD (ROUTINE X 2)
CULTURE: NO GROWTH
Culture: NO GROWTH

## 2013-02-22 ENCOUNTER — Emergency Department (HOSPITAL_COMMUNITY)
Admission: EM | Admit: 2013-02-22 | Discharge: 2013-02-22 | Disposition: A | Discharge status: Home / Self Care | Payer: Medicare HMO | Attending: Emergency Medicine | Admitting: Emergency Medicine

## 2013-02-22 ENCOUNTER — Encounter (HOSPITAL_COMMUNITY): Payer: Self-pay | Admitting: Emergency Medicine

## 2013-02-22 ENCOUNTER — Telehealth (HOSPITAL_COMMUNITY): Payer: Self-pay

## 2013-02-22 DIAGNOSIS — G709 Myoneural disorder, unspecified: Secondary | ICD-10-CM | POA: Insufficient documentation

## 2013-02-22 DIAGNOSIS — G8250 Quadriplegia, unspecified: Secondary | ICD-10-CM | POA: Insufficient documentation

## 2013-02-22 DIAGNOSIS — Z8701 Personal history of pneumonia (recurrent): Secondary | ICD-10-CM | POA: Insufficient documentation

## 2013-02-22 DIAGNOSIS — Z931 Gastrostomy status: Secondary | ICD-10-CM | POA: Insufficient documentation

## 2013-02-22 DIAGNOSIS — Z87442 Personal history of urinary calculi: Secondary | ICD-10-CM | POA: Insufficient documentation

## 2013-02-22 DIAGNOSIS — L89109 Pressure ulcer of unspecified part of back, unspecified stage: Secondary | ICD-10-CM | POA: Insufficient documentation

## 2013-02-22 DIAGNOSIS — Z87891 Personal history of nicotine dependence: Secondary | ICD-10-CM | POA: Insufficient documentation

## 2013-02-22 DIAGNOSIS — Z87440 Personal history of urinary (tract) infections: Secondary | ICD-10-CM | POA: Insufficient documentation

## 2013-02-22 DIAGNOSIS — Z87448 Personal history of other diseases of urinary system: Secondary | ICD-10-CM | POA: Insufficient documentation

## 2013-02-22 DIAGNOSIS — G35 Multiple sclerosis: Secondary | ICD-10-CM

## 2013-02-22 DIAGNOSIS — F329 Major depressive disorder, single episode, unspecified: Secondary | ICD-10-CM | POA: Insufficient documentation

## 2013-02-22 DIAGNOSIS — Z79899 Other long term (current) drug therapy: Secondary | ICD-10-CM | POA: Insufficient documentation

## 2013-02-22 DIAGNOSIS — Z792 Long term (current) use of antibiotics: Secondary | ICD-10-CM | POA: Insufficient documentation

## 2013-02-22 DIAGNOSIS — F3289 Other specified depressive episodes: Secondary | ICD-10-CM | POA: Insufficient documentation

## 2013-02-22 DIAGNOSIS — Z791 Long term (current) use of non-steroidal anti-inflammatories (NSAID): Secondary | ICD-10-CM | POA: Insufficient documentation

## 2013-02-22 DIAGNOSIS — Z8709 Personal history of other diseases of the respiratory system: Secondary | ICD-10-CM | POA: Insufficient documentation

## 2013-02-22 DIAGNOSIS — J45909 Unspecified asthma, uncomplicated: Secondary | ICD-10-CM | POA: Insufficient documentation

## 2013-02-22 DIAGNOSIS — Z7401 Bed confinement status: Secondary | ICD-10-CM | POA: Insufficient documentation

## 2013-02-22 DIAGNOSIS — Z862 Personal history of diseases of the blood and blood-forming organs and certain disorders involving the immune mechanism: Secondary | ICD-10-CM | POA: Insufficient documentation

## 2013-02-22 NOTE — ED Notes (Addendum)
Per EMS:  Patient has MS and has multiple contractions, and a large bedsore which requires a special power operated pressure reducing mattress and also frequent suctioning.  EMS states that the patient's sitter primarily called because of loss of power, so equipment could not be used.  Feeding pump is battery operated, patient was fed this am at 0600.  Patient is also being seen by a NP, diagnosed with UTI 2 weeks ago, EMS drained 1100 mL of cloudy foul smelling urine, NP does not want to treat according to EMS, patient is at cognitive baseline, patient is afebrile, VSS at this time

## 2013-02-22 NOTE — Discharge Instructions (Signed)
Followup with your primary care physician to check urine culture results.  Multiple Sclerosis Multiple sclerosis (MS) is a disease of the central nervous system. It leads to loss of the insulating covering of the nerves (myelin sheath) of your brain. When this happens, brain signals do not get transmitted properly or may not get transmitted at all. The symptoms of MS occur in episodes or attacks. These attacks may last weeks to months. There may be long periods of nearly no problems between attacks. The age of onset of MS varies.  CAUSES The cause of MS is unknown. However, it is more common in the [place] than in the [place]. RISK FACTORS There is a higher incidence of MS in women than in men. MS is not an inherited illness, although your risk of MS is higher if you have a relative with MS. SIGNS AND SYMPTOMS  The symptoms of MS occur in episodes or attacks. These attacks may last weeks to months. There may be long periods of almost no symptoms between attacks. The symptoms of MS vary. This is because of the many different ways it affects the central nervous system. The main symptoms of MS include:  Vision problems and eye pain.  Numbness.  Weakness.  Paralysis in your arms, hands, feet, and legs (extremities).  Balance problems.  Tremors. DIAGNOSIS  Your health care provider can diagnose MS with the help of imaging exams and lab tests. These may include specialized X-ray exams and spinal fluid tests. The best imaging exam to confirm a diagnosis of MS is MRI. TREATMENT  There is no known cure for MS, but there are medicines that can decrease the number and frequency of attacks. Steroids are often used for short-term relief. Physical and occupational therapy may also help. HOME CARE INSTRUCTIONS   Take medicines as directed by your health care provider.  Exercise as directed by your health care provider. SEEK MEDICAL CARE IF: You begin to feel  depressed. SEEK IMMEDIATE MEDICAL CARE IF:  You develop paralysis.  You develop problems with bladder, bowel, or sexual function.  You develop mental changes, such as forgetfulness or mood swings.  You have a seizure. Document Released: 12/23/1999 Document Revised: 10/15/2012 Document Reviewed: 09/01/2012 [facility] Patient Information 2014 [place].

## 2013-02-22 NOTE — ED Notes (Signed)
Patient has a stage 1 pressure ulcer on left buttock, a stage 2 pressure ulcer on sacrum that is dry, non-draining
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
## 2013-02-22 NOTE — ED Notes (Signed)
Patient has suprapubic catheter, insertion site is normal in appearance, no redness or swelling, sediment present in tubing, urine is a cloudy yellow, non-odorous.

## 2013-02-24 ENCOUNTER — Encounter (HOSPITAL_BASED_OUTPATIENT_CLINIC_OR_DEPARTMENT_OTHER): Payer: Medicare HMO | Attending: General Surgery

## 2013-02-24 LAB — URINE CULTURE: Colony Count: >=100000

## 2013-03-10 ENCOUNTER — Encounter (HOSPITAL_BASED_OUTPATIENT_CLINIC_OR_DEPARTMENT_OTHER): Payer: Medicare HMO | Attending: General Surgery

## 2013-03-10 DIAGNOSIS — L89209 Pressure ulcer of unspecified hip, unspecified stage: Secondary | ICD-10-CM | POA: Insufficient documentation

## 2013-03-10 DIAGNOSIS — L89899 Pressure ulcer of other site, unspecified stage: Secondary | ICD-10-CM | POA: Insufficient documentation

## 2013-03-10 DIAGNOSIS — L89609 Pressure ulcer of unspecified heel, unspecified stage: Secondary | ICD-10-CM | POA: Insufficient documentation

## 2013-03-10 DIAGNOSIS — L8992 Pressure ulcer of unspecified site, stage 2: Secondary | ICD-10-CM | POA: Insufficient documentation

## 2013-04-16 ENCOUNTER — Ambulatory Visit: Payer: Self-pay | Admitting: Neurology

## 2013-04-28 ENCOUNTER — Encounter (HOSPITAL_BASED_OUTPATIENT_CLINIC_OR_DEPARTMENT_OTHER): Payer: Medicare HMO | Attending: General Surgery

## 2013-04-28 DIAGNOSIS — L8993 Pressure ulcer of unspecified site, stage 3: Secondary | ICD-10-CM | POA: Insufficient documentation

## 2013-04-28 DIAGNOSIS — L89209 Pressure ulcer of unspecified hip, unspecified stage: Secondary | ICD-10-CM | POA: Insufficient documentation

## 2013-06-02 ENCOUNTER — Encounter (HOSPITAL_BASED_OUTPATIENT_CLINIC_OR_DEPARTMENT_OTHER): Payer: Medicare HMO | Attending: General Surgery

## 2013-06-02 DIAGNOSIS — L8994 Pressure ulcer of unspecified site, stage 4: Secondary | ICD-10-CM | POA: Insufficient documentation

## 2013-06-02 DIAGNOSIS — L89209 Pressure ulcer of unspecified hip, unspecified stage: Secondary | ICD-10-CM | POA: Insufficient documentation

## 2013-06-29 ENCOUNTER — Inpatient Hospital Stay (HOSPITAL_COMMUNITY)
Admission: EM | Admit: 2013-06-29 | Discharge: 2013-07-03 | DRG: 853 | Disposition: A | Discharge status: Home / Self Care | Payer: Medicare HMO | Attending: Internal Medicine | Admitting: Internal Medicine

## 2013-06-29 ENCOUNTER — Inpatient Hospital Stay (HOSPITAL_COMMUNITY): Payer: Medicare HMO

## 2013-06-29 ENCOUNTER — Encounter (HOSPITAL_COMMUNITY): Payer: Self-pay | Admitting: Emergency Medicine

## 2013-06-29 ENCOUNTER — Emergency Department (HOSPITAL_COMMUNITY): Payer: Medicare HMO

## 2013-06-29 DIAGNOSIS — Z87891 Personal history of nicotine dependence: Secondary | ICD-10-CM

## 2013-06-29 DIAGNOSIS — Z87442 Personal history of urinary calculi: Secondary | ICD-10-CM

## 2013-06-29 DIAGNOSIS — IMO0001 Reserved for inherently not codable concepts without codable children: Secondary | ICD-10-CM

## 2013-06-29 DIAGNOSIS — G8250 Quadriplegia, unspecified: Secondary | ICD-10-CM

## 2013-06-29 DIAGNOSIS — G92 Toxic encephalopathy: Secondary | ICD-10-CM

## 2013-06-29 DIAGNOSIS — R5383 Other fatigue: Secondary | ICD-10-CM

## 2013-06-29 DIAGNOSIS — N319 Neuromuscular dysfunction of bladder, unspecified: Secondary | ICD-10-CM

## 2013-06-29 DIAGNOSIS — A4159 Other Gram-negative sepsis: Secondary | ICD-10-CM | POA: Diagnosis present

## 2013-06-29 DIAGNOSIS — Z7401 Bed confinement status: Secondary | ICD-10-CM

## 2013-06-29 DIAGNOSIS — Z87440 Personal history of urinary (tract) infections: Secondary | ICD-10-CM | POA: Diagnosis not present

## 2013-06-29 DIAGNOSIS — R5381 Other malaise: Secondary | ICD-10-CM

## 2013-06-29 DIAGNOSIS — G9340 Encephalopathy, unspecified: Secondary | ICD-10-CM

## 2013-06-29 DIAGNOSIS — Z931 Gastrostomy status: Secondary | ICD-10-CM | POA: Diagnosis not present

## 2013-06-29 DIAGNOSIS — Z1635 Resistance to multiple antimicrobial drugs: Secondary | ICD-10-CM

## 2013-06-29 DIAGNOSIS — G35 Multiple sclerosis: Secondary | ICD-10-CM

## 2013-06-29 DIAGNOSIS — Z825 Family history of asthma and other chronic lower respiratory diseases: Secondary | ICD-10-CM

## 2013-06-29 DIAGNOSIS — N390 Urinary tract infection, site not specified: Secondary | ICD-10-CM

## 2013-06-29 DIAGNOSIS — D649 Anemia, unspecified: Secondary | ICD-10-CM

## 2013-06-29 DIAGNOSIS — R627 Adult failure to thrive: Secondary | ICD-10-CM

## 2013-06-29 DIAGNOSIS — A419 Sepsis, unspecified organism: Secondary | ICD-10-CM

## 2013-06-29 DIAGNOSIS — B952 Enterococcus as the cause of diseases classified elsewhere: Secondary | ICD-10-CM

## 2013-06-29 DIAGNOSIS — R633 Feeding difficulties, unspecified: Secondary | ICD-10-CM

## 2013-06-29 DIAGNOSIS — L89152 Pressure ulcer of sacral region, stage 2: Secondary | ICD-10-CM

## 2013-06-29 DIAGNOSIS — J45909 Unspecified asthma, uncomplicated: Secondary | ICD-10-CM | POA: Diagnosis present

## 2013-06-29 DIAGNOSIS — E876 Hypokalemia: Secondary | ICD-10-CM

## 2013-06-29 DIAGNOSIS — R1310 Dysphagia, unspecified: Secondary | ICD-10-CM | POA: Diagnosis present

## 2013-06-29 DIAGNOSIS — N3090 Cystitis, unspecified without hematuria: Secondary | ICD-10-CM | POA: Diagnosis present

## 2013-06-29 DIAGNOSIS — E43 Unspecified severe protein-calorie malnutrition: Secondary | ICD-10-CM | POA: Diagnosis present

## 2013-06-29 DIAGNOSIS — R4701 Aphasia: Secondary | ICD-10-CM | POA: Diagnosis present

## 2013-06-29 DIAGNOSIS — G929 Unspecified toxic encephalopathy: Secondary | ICD-10-CM

## 2013-06-29 DIAGNOSIS — J4550 Severe persistent asthma, uncomplicated: Secondary | ICD-10-CM

## 2013-06-29 DIAGNOSIS — N210 Calculus in bladder: Secondary | ICD-10-CM

## 2013-06-29 DIAGNOSIS — R532 Functional quadriplegia: Secondary | ICD-10-CM | POA: Diagnosis present

## 2013-06-29 DIAGNOSIS — E46 Unspecified protein-calorie malnutrition: Secondary | ICD-10-CM

## 2013-06-29 DIAGNOSIS — R531 Weakness: Secondary | ICD-10-CM

## 2013-06-29 DIAGNOSIS — IMO0002 Reserved for concepts with insufficient information to code with codable children: Secondary | ICD-10-CM | POA: Diagnosis not present

## 2013-06-29 DIAGNOSIS — R339 Retention of urine, unspecified: Secondary | ICD-10-CM | POA: Diagnosis present

## 2013-06-29 DIAGNOSIS — R4182 Altered mental status, unspecified: Secondary | ICD-10-CM | POA: Diagnosis present

## 2013-06-29 LAB — URINALYSIS, ROUTINE W REFLEX MICROSCOPIC
Bilirubin Urine: NEGATIVE
Glucose, UA: NEGATIVE mg/dL
Ketones, ur: NEGATIVE mg/dL
Nitrite: POSITIVE — AB
Protein, ur: >300 mg/dL — AB
SPECIFIC GRAVITY, URINE: 1.017 (ref 1.005–1.030)
UROBILINOGEN UA: 1 mg/dL (ref 0.0–1.0)
pH: 7.5 (ref 5.0–8.0)

## 2013-06-29 LAB — CBC WITH DIFFERENTIAL/PLATELET
BASOS ABS: 0 10*3/uL (ref 0.0–0.1)
Basophils Relative: 0 % (ref 0–1)
EOS ABS: 0.2 10*3/uL (ref 0.0–0.7)
EOS PCT: 1 % (ref 0–5)
HCT: 46.8 % (ref 39.0–52.0)
Hemoglobin: 15.8 g/dL (ref 13.0–17.0)
Lymphocytes Relative: 7 % — ABNORMAL LOW (ref 12–46)
Lymphs Abs: 1.3 10*3/uL (ref 0.7–4.0)
MCH: 28.3 pg (ref 26.0–34.0)
MCHC: 33.8 g/dL (ref 30.0–36.0)
MCV: 83.9 fL (ref 78.0–100.0)
Monocytes Absolute: 1.7 10*3/uL — ABNORMAL HIGH (ref 0.1–1.0)
Monocytes Relative: 9 % (ref 3–12)
NEUTROS PCT: 83 % — AB (ref 43–77)
Neutro Abs: 14.5 10*3/uL — ABNORMAL HIGH (ref 1.7–7.7)
Platelets: 334 10*3/uL (ref 150–400)
RBC: 5.58 MIL/uL (ref 4.22–5.81)
RDW: 16.3 % — AB (ref 11.5–15.5)
WBC: 17.7 10*3/uL — ABNORMAL HIGH (ref 4.0–10.5)

## 2013-06-29 LAB — BASIC METABOLIC PANEL
BUN: 33 mg/dL — ABNORMAL HIGH (ref 6–23)
CALCIUM: 10.1 mg/dL (ref 8.4–10.5)
CO2: 23 meq/L (ref 19–32)
CREATININE: 1 mg/dL (ref 0.50–1.35)
Chloride: 97 mEq/L (ref 96–112)
GFR calc Af Amer: >90 mL/min (ref >90)
Glucose, Bld: 113 mg/dL — ABNORMAL HIGH (ref 70–99)
Potassium: 4.3 mEq/L (ref 3.7–5.3)
Sodium: 139 mEq/L (ref 137–147)

## 2013-06-29 LAB — CBG MONITORING, ED: GLUCOSE-CAPILLARY: 111 mg/dL — AB (ref 70–99)

## 2013-06-29 LAB — LACTIC ACID, PLASMA: LACTIC ACID, VENOUS: 1.2 mmol/L (ref 0.5–2.2)

## 2013-06-29 LAB — URINE MICROSCOPIC-ADD ON

## 2013-06-29 LAB — I-STAT CG4 LACTIC ACID, ED: LACTIC ACID, VENOUS: 1.82 mmol/L (ref 0.5–2.2)

## 2013-06-29 LAB — MRSA PCR SCREENING: MRSA by PCR: NEGATIVE

## 2013-06-29 MED ORDER — OMEPRAZOLE 2 MG/ML ORAL SUSPENSION
40.0000 mg | Freq: Every day | ORAL | Status: DC
  Administered 2013-06-29 – 2013-07-03 (×5): 40 mg
  Filled 2013-06-29 (×5): qty 20

## 2013-06-29 MED ORDER — NYSTATIN 100000 UNIT/GM EX POWD
1.0000 g | Freq: Three times a day (TID) | CUTANEOUS | Status: DC
  Administered 2013-06-29 – 2013-07-03 (×11): 1 g via TOPICAL
  Filled 2013-06-29: qty 15

## 2013-06-29 MED ORDER — DANTROLENE SODIUM 100 MG PO CAPS
200.0000 mg | ORAL_CAPSULE | Freq: Every day | ORAL | Status: DC
  Administered 2013-06-29 – 2013-07-03 (×5): 200 mg
  Filled 2013-06-29 (×5): qty 2

## 2013-06-29 MED ORDER — FREE WATER
240.0000 mL | Status: DC
  Administered 2013-06-29 – 2013-07-03 (×23): 240 mL

## 2013-06-29 MED ORDER — POLYETHYLENE GLYCOL 3350 17 G PO PACK
17.0000 g | PACK | Freq: Every day | ORAL | Status: DC
  Administered 2013-06-29 – 2013-07-03 (×4): 17 g
  Filled 2013-06-29 (×5): qty 1

## 2013-06-29 MED ORDER — RISAQUAD PO CAPS
2.0000 | ORAL_CAPSULE | Freq: Every day | ORAL | Status: DC
  Administered 2013-06-29 – 2013-07-01 (×3): 2 via ORAL
  Filled 2013-06-29 (×3): qty 2

## 2013-06-29 MED ORDER — MORPHINE SULFATE 2 MG/ML IJ SOLN: 2.0000 mg | INTRAMUSCULAR | Status: DC | PRN

## 2013-06-29 MED ORDER — LEVETIRACETAM 100 MG/ML PO SOLN
750.0000 mg | Freq: Two times a day (BID) | ORAL | Status: DC
  Administered 2013-06-29 – 2013-07-03 (×8): 750 mg
  Filled 2013-06-29 (×9): qty 7.5

## 2013-06-29 MED ORDER — FLUCONAZOLE 100 MG PO TABS
100.0000 mg | ORAL_TABLET | Freq: Every day | ORAL | Status: DC
  Administered 2013-06-29 – 2013-06-30 (×2): 100 mg via ORAL
  Filled 2013-06-29 (×3): qty 1

## 2013-06-29 MED ORDER — IOHEXOL 300 MG/ML  SOLN
100.0000 mL | Freq: Once | INTRAMUSCULAR | Status: AC | PRN
  Administered 2013-06-29: 100 mL via INTRAVENOUS

## 2013-06-29 MED ORDER — SODIUM CHLORIDE 0.9 % IV BOLUS (SEPSIS)
500.0000 mL | Freq: Once | INTRAVENOUS | Status: AC
  Administered 2013-06-29: 500 mL via INTRAVENOUS

## 2013-06-29 MED ORDER — BACLOFEN 20 MG PO TABS
20.0000 mg | ORAL_TABLET | Freq: Three times a day (TID) | ORAL | Status: DC
  Administered 2013-06-29 – 2013-07-03 (×11): 20 mg
  Filled 2013-06-29 (×14): qty 1

## 2013-06-29 MED ORDER — CEFTRIAXONE SODIUM 1 G IJ SOLR: 1.0000 g | Freq: Once | INTRAMUSCULAR | Status: DC

## 2013-06-29 MED ORDER — ONDANSETRON HCL 4 MG PO TABS
4.0000 mg | ORAL_TABLET | Freq: Four times a day (QID) | ORAL | Status: DC | PRN
Start: 2013-06-29 — End: 2013-07-01

## 2013-06-29 MED ORDER — HYDROCODONE-ACETAMINOPHEN 5-325 MG PO TABS: 1.0000 | ORAL_TABLET | ORAL | Status: DC | PRN

## 2013-06-29 MED ORDER — SODIUM CHLORIDE 0.9 % IV SOLN
1.0000 g | Freq: Three times a day (TID) | INTRAVENOUS | Status: DC
  Administered 2013-06-29 – 2013-07-02 (×9): 1 g via INTRAVENOUS
  Filled 2013-06-29 (×11): qty 1

## 2013-06-29 MED ORDER — SODIUM CHLORIDE 0.9 % IV SOLN
INTRAVENOUS | Status: AC
  Administered 2013-06-29 – 2013-06-30 (×3): via INTRAVENOUS

## 2013-06-29 MED ORDER — ONDANSETRON HCL 4 MG/2ML IJ SOLN: 4.0000 mg | Freq: Four times a day (QID) | INTRAMUSCULAR | Status: DC | PRN

## 2013-06-29 MED ORDER — IOHEXOL 300 MG/ML  SOLN
50.0000 mL | Freq: Once | INTRAMUSCULAR | Status: AC | PRN
  Administered 2013-06-29: 20 mL via INTRAVENOUS

## 2013-06-29 MED ORDER — SODIUM CHLORIDE 0.9 % IV BOLUS (SEPSIS)
1000.0000 mL | Freq: Once | INTRAVENOUS | Status: AC
  Administered 2013-06-29: 1000 mL via INTRAVENOUS

## 2013-06-29 MED ORDER — IOHEXOL 300 MG/ML  SOLN: 25.0000 mL | INTRAMUSCULAR | Status: AC

## 2013-06-29 MED ORDER — VITAMIN D3 25 MCG (1000 UNIT) PO TABS
1000.0000 [IU] | ORAL_TABLET | Freq: Two times a day (BID) | ORAL | Status: DC
  Administered 2013-06-29 – 2013-07-01 (×4): 1000 [IU] via ORAL
  Filled 2013-06-29 (×5): qty 1

## 2013-06-29 MED ORDER — BUPROPION HCL 75 MG PO TABS
75.0000 mg | ORAL_TABLET | Freq: Three times a day (TID) | ORAL | Status: DC
  Administered 2013-06-29 – 2013-07-03 (×11): 75 mg
  Filled 2013-06-29 (×14): qty 1

## 2013-06-29 MED ORDER — IPRATROPIUM-ALBUTEROL 0.5-2.5 (3) MG/3ML IN SOLN
3.0000 mL | Freq: Four times a day (QID) | RESPIRATORY_TRACT | Status: DC
  Administered 2013-06-29 – 2013-06-30 (×3): 3 mL via RESPIRATORY_TRACT
  Filled 2013-06-29 (×3): qty 3

## 2013-06-29 MED ORDER — HEPARIN SODIUM (PORCINE) 5000 UNIT/ML IJ SOLN
5000.0000 [IU] | Freq: Three times a day (TID) | INTRAMUSCULAR | Status: DC
  Administered 2013-06-29 – 2013-07-03 (×11): 5000 [IU] via SUBCUTANEOUS
  Filled 2013-06-29 (×14): qty 1

## 2013-06-29 MED ORDER — ZINC OXIDE 40 % EX OINT
TOPICAL_OINTMENT | Freq: Three times a day (TID) | CUTANEOUS | Status: DC
  Administered 2013-06-29 – 2013-07-01 (×5): via TOPICAL
  Administered 2013-07-01: 1 via TOPICAL
  Administered 2013-07-01 – 2013-07-03 (×5): via TOPICAL
  Filled 2013-06-29: qty 114

## 2013-06-29 MED ORDER — DIAZEPAM 5 MG PO TABS
5.0000 mg | ORAL_TABLET | Freq: Every day | ORAL | Status: DC
  Administered 2013-06-29 – 2013-07-02 (×4): 5 mg
  Filled 2013-06-29 (×4): qty 1

## 2013-06-29 MED ORDER — GUAIFENESIN-DM 100-10 MG/5ML PO SYRP: 5.0000 mL | ORAL_SOLUTION | ORAL | Status: DC | PRN

## 2013-06-29 MED ORDER — CRANBERRY JUICE POWDER 425 MG PO CAPS: 425.0000 mg | ORAL_CAPSULE | Freq: Two times a day (BID) | ORAL | Status: DC

## 2013-06-29 NOTE — ED Notes (Signed)
Per EMS pt family reported increase in lethargic than baseline. Pt. Does not talk at normal baseline but can blink eyes. Family sts cath came out yesterday, and pt had a temp of 100F last night, and was given tylenol and temp this morning was 23F. Pt nurse at home sts pt has had decrease pedal pulse in L leg. EMS vitals reported 120/80, HR 99, O2 99. CBG 99

## 2013-06-29 NOTE — Progress Notes (Signed)
ANTIBIOTIC CONSULT NOTE - INITIAL  Pharmacy Consult for Meropenem Indication: UTI  No Known Allergies  Patient Measurements: Height: 6\' 4"  (193 cm) Weight: 180 lb (81.647 kg) IBW/kg (Calculated) : 86.8  Vital Signs: Temp: 98.9 F (37.2 C) (06/22 0905) Temp src: Rectal (06/22 0905) BP: 108/72 mmHg (06/22 1302) Pulse Rate: 98 (06/22 1302) Intake/Output from previous day:   Intake/Output from this shift:    Labs:  Recent Labs  06/29/13 0906  WBC 17.7*  HGB 15.8  PLT 334  CREATININE 1.00   Estimated Creatinine Clearance: 114.5 ml/min (by C-G formula based on Cr of 1). No results found for this basename: VANCOTROUGH, VANCOPEAK, VANCORANDOM, GENTTROUGH, GENTPEAK, GENTRANDOM, TOBRATROUGH, TOBRAPEAK, TOBRARND, AMIKACINPEAK, AMIKACINTROU, AMIKACIN,  in the last 72 hours   Microbiology: No results found for this or any previous visit (from the past 720 hour(s)).  Medical History: Past Medical History  Diagnosis Date  . MS (multiple sclerosis)   . 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 Childhood asthma   . Depression   . Neuromuscular disorder     Quadraperesis  . Recurrent UTI   . Dysphagia   . Bladder calculi   . Neurogenic bladder   . Shortness of breath   . Recurrent upper respiratory infection (URI)   . Normocytic anemia 05/28/2011  . Aspiration pneumonia 01/20/2013    Medications:  See electronic med rec  Assessment: 38 y.o. male presents with AMS and fever and suprapubic foley catheter problems. Noted pt with h/o recurrent UTIs. Higher risk for MDR organism with suprapublic foley catheter. Has grown proteus mirabilis in the past (sensitive to Ancef/Zosyn/Rocephin/Levaquin). To use broad spectrum meropenem for UTI. Afeb. Wbc elevated. SCr 1 - probably not reflective of renal function in quadriparesis pt.  Goal of Therapy:  Resolution of infection  Plan:  1. Meropenem 1gm IV q8h 2. Will f/u renal function, UOP, micro data 3. Hopefully can narrow  antibiotic soon  Sherlon Handing, PharmD, BCPS Clinical pharmacist, pager [phone] 06/29/2013,1:47 PM

## 2013-06-29 NOTE — H&P (Signed)
Patient Demographics  Terry Palmer, is a 38 y.o. male  MRN: [id number]   DOB - Aug 16, 1975  Admit Date - 06/29/2013  Outpatient Primary MD for the patient is Reymundo Poll, MD   With History of -  Past Medical History  Diagnosis Date  . MS (multiple sclerosis)   . 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 Childhood asthma   . Depression   . Neuromuscular disorder     Quadraperesis  . Recurrent UTI   . Dysphagia   . Bladder calculi   . Neurogenic bladder   . Shortness of breath   . Recurrent upper respiratory infection (URI)   . Normocytic anemia 05/28/2011  . Aspiration pneumonia 01/20/2013      Past Surgical History  Procedure Laterality Date  . Lumbar puncture  10/12/2002  . Gastrostomy  04/16/2011    Procedure: GASTROSTOMY;  Surgeon: Zenovia Jarred, MD;  Location: [facility];  Service: General;  Laterality: N/A;  Open G-Tube placement    in for   Chief Complaint  Patient presents with  . Altered Mental Status     HPI  Terry Palmer  is a 38 y.o. 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   Patient with above history underwent suprapubic catheter change few days ago by one of the home health nurses, apparently this new catheter was not draining, upon evaluation it was found that the catheter was probably inserted into a fistulous opening next  to the suprapubic catheter opening site, he started running fevers yesterday at home and had no drainage out of his new catheter site, he was then brought to the ER.   In the ER he was found to have leukocytosis, low-grade fever and hypotension, urology was called to replace suprapubic catheter but they failed as likely the old suprapubic catheter opening had clogged off, IR was requested and a new suprapubic catheter was placed, I was called to admit the patient.    Review of Systems    He should answers questions with the brink of his eye, unable to verbalize at baseline, unable to follow commands due to severe baseline contractures, unreliable review of systems but to my best understanding he saying no to headache chest or abdominal pain.   Social History History  Substance Use Topics  . Smoking status: Former Smoker    Types: Cigarettes    Quit date: 02/02/1999  . Smokeless tobacco: Former Systems developer  . Alcohol Use: No     Family History Family History  Problem Relation Age of Onset  .  Asthma Mother      Prior to Admission medications   Medication Sig Start Date End Date Taking? Authorizing Provider  acetaminophen (TYLENOL) 160 MG/5ML elixir Take 40 mg by mouth every 4 (four) hours as needed for fever or pain.   Yes Historical Provider, MD  Amino Acids-Protein Hydrolys (FEEDING SUPPLEMENT, PRO-STAT SUGAR FREE 64,) LIQD Place 30 mLs into feeding tube 3 (three) times daily. 01/24/13  Yes Bonnielee Haff, MD  ascorbic acid (VITAMIN C) 500 MG/5ML syrup Place 5 mLs (500 mg total) into feeding tube 2 (two) times daily. 01/24/13  Yes Bonnielee Haff, MD  baclofen (LIORESAL) 20 MG tablet Place 1 tablet (20 mg total) into feeding tube 3 (three) times daily. 01/24/13  Yes Bonnielee Haff, MD  buPROPion (WELLBUTRIN) 75 MG tablet Place 1 tablet (75 mg total) into feeding tube 3 (three) times daily. 01/24/13  Yes Bonnielee Haff, MD  cholecalciferol (VITAMIN D) 1000 UNITS tablet Take 1 tablet (1,000  Units total) by mouth 2 (two) times daily. 01/24/13  Yes Bonnielee Haff, MD  Cranberry Juice Powder 425 MG CAPS 1 capsule (425 mg total) by PEG Tube route 2 (two) times daily. 01/24/13  Yes Bonnielee Haff, MD  dantrolene (DANTRIUM) 100 MG capsule Place 200 mg into feeding tube daily.   Yes Historical Provider, MD  Diaper Rash Products (DESITIN EX) Apply 1 application topically as needed (may apply to minor skin breakdown until healed).   Yes Historical Provider, MD  diazepam (VALIUM) 5 MG tablet Place 1 tablet (5 mg total) into feeding tube at bedtime. 01/24/13  Yes Bonnielee Haff, MD  fexofenadine (ALLEGRA) 180 MG tablet Place 180 mg into feeding tube daily.   Yes Historical Provider, MD  Fluconazole (DIFLUCAN PO) Place 1 tablet into feeding tube daily.   Yes Historical Provider, MD  Garlic Oil (ODORLESS GARLIC) 710 MG TABS 626 mg by PEG Tube route daily.    Yes Historical Provider, MD  HYDROcodone-acetaminophen (NORCO) 7.5-325 MG per tablet Place 1 tablet into feeding tube every 6 (six) hours as needed (pain).   Yes Historical Provider, MD  ipratropium-albuterol (DUONEB) 0.5-2.5 (3) MG/3ML SOLN Take 3 mLs by nebulization every 4 (four) hours as needed (Shortness of breath).  02/12/13  Yes Historical Provider, MD  Lactobacillus (ACIDOPHILUS PO) Place into feeding tube as directed.   Yes Historical Provider, MD  levETIRAcetam (KEPPRA) 100 MG/ML solution Place 1,000 mg into feeding tube 2 (two) times daily. 01/24/13  Yes Bonnielee Haff, MD  levETIRAcetam (KEPPRA) 500 MG tablet Place 1,000 mg into feeding tube 2 (two) times daily.   Yes Historical Provider, MD  Multiple Vitamin (MULTIVITAMIN WITH MINERALS) TABS tablet Place 1 tablet into feeding tube daily. 01/24/13  Yes Bonnielee Haff, MD  Nutritional Supplements (FEEDING SUPPLEMENT, OSMOLITE 1.5 CAL,) LIQD Place 1,000 mLs into feeding tube continuous. At 7ml/HR 01/24/13  Yes Bonnielee Haff, MD  nystatin (MYCOSTATIN/NYSTOP) 100000 UNIT/GM POWD Apply 1 g  topically 3 (three) times daily. 01/24/13  Yes Bonnielee Haff, MD  omeprazole (PRILOSEC) 2 mg/mL SUSP Place 20 mLs (40 mg total) into feeding tube daily. 01/24/13  Yes Bonnielee Haff, MD  oxybutynin (DITROPAN) 5 MG tablet Place 5 mg into feeding tube 2 (two) times daily.   Yes Historical Provider, MD  polyethylene glycol (MIRALAX / GLYCOLAX) packet Place 17 g into feeding tube daily. 01/24/13  Yes Bonnielee Haff, MD  polyvinyl alcohol (LIQUIFILM TEARS) 1.4 % ophthalmic solution Place 1 drop into both eyes 3 (three) times daily. 01/24/13  Yes Bonnielee Haff, MD  Water For Irrigation, Sterile (FREE WATER) SOLN Place 240 mLs into feeding tube every 4 (four) hours. 01/24/13  Yes Bonnielee Haff, MD    No Known Allergies  Physical Exam  Vitals  Blood pressure 96/65, pulse 99, temperature 98.9 F (37.2 C), temperature source Rectal, resp. rate 16, height 6\' 4"  (1.93 m), weight 81.647 kg (180 lb), SpO2 96.00%.   1. General middle-aged African American male  lying in bed in NAD   2. Severe chronic contractures in all 4 extremities, he is unable to verbalize at baseline currently unable to follow commands reliably, answers some basic questions with the brink of his eye.  3. No F.N deficits, ALL C.Nerves Intact, Strength 5/5 all 4 extremities, Sensation intact all 4 extremities, Plantars down going.  4. Ears and Eyes appear Normal, Conjunctivae clear, PERRLA. Moist Oral Mucosa.  5. Supple Neck, No JVD, No cervical lymphadenopathy appriciated, No Carotid Bruits.  6. Symmetrical Chest wall movement, Good air movement bilaterally, CTAB.  7. RRR, No Gallops, Rubs or Murmurs, No Parasternal Heave.  8. Positive Bowel Sounds, Abdomen Soft, No tenderness, No organomegaly appriciated,No rebound -guarding or rigidity. PEG tube site is clean, he has a new suprapubic catheter placed by IR, a small fistula noted right next to the suprapubic catheter insertion site which is not draining any pus at this time.  9.   No Cyanosis, Normal Skin Turgor, No Skin Rash or Bruise.  10. Good muscle tone,  joints appear normal , no effusions, Normal ROM.  11. No Palpable Lymph Nodes in Neck or Axillae    Data Review  CBC  Recent Labs Lab 06/29/13 0906  WBC 17.7*  HGB 15.8  HCT 46.8  PLT 334  MCV 83.9  MCH 28.3  MCHC 33.8  RDW 16.3*  LYMPHSABS 1.3  MONOABS 1.7*  EOSABS 0.2  BASOSABS 0.0   ------------------------------------------------------------------------------------------------------------------  Chemistries   Recent Labs Lab 06/29/13 0906  NA 139  K 4.3  CL 97  CO2 23  GLUCOSE 113*  BUN 33*  CREATININE 1.00  CALCIUM 10.1   ------------------------------------------------------------------------------------------------------------------ estimated creatinine clearance is 114.5 ml/min (by C-G formula based on Cr of 1). ------------------------------------------------------------------------------------------------------------------ No results found for this basename: TSH, T4TOTAL, FREET3, T3FREE, THYROIDAB,  in the last 72 hours   Coagulation profile No results found for this basename: INR, PROTIME,  in the last 168 hours ------------------------------------------------------------------------------------------------------------------- No results found for this basename: DDIMER,  in the last 72 hours -------------------------------------------------------------------------------------------------------------------  Cardiac Enzymes No results found for this basename: CK, CKMB, TROPONINI, MYOGLOBIN,  in the last 168 hours ------------------------------------------------------------------------------------------------------------------ No components found with this basename: POCBNP,     ---------------------------------------------------------------------------------------------------------------  ----------------------------------------------------------------------------------------------------------------  Imaging results:   Dg Chest Portable 1 View  06/29/2013   CLINICAL DATA:  ALTERED MENTAL STATUS  EXAM: PORTABLE CHEST - 1 VIEW  COMPARISON:  Portable chest 01/20/2013.  FINDINGS: Low lung volumes. The heart size and mediastinal contours are within normal limits. Both lungs are clear. The visualized skeletal structures are unremarkable.  IMPRESSION: No active disease.   Electronically Signed   By: Margaree Mackintosh M.D.   On: 06/29/2013 09:47         Assessment & Plan   1. Sepsis most likely secondary to urinary retention with a UTI - history of enterococcal UTI in the past, will obtain blood cultures and urine cultures, will place on IV meropenem dosed by pharmacy, urology is already on board. Since there is history of suprapubic catheter placement a few days ago into her  fistula site which was not a real opening I will also obtain CT scan of the abdomen and pelvis to rule out any viscus perforation of bladder perforation.    2. 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 Continue supportive care with muscle relaxants, all feeding and medications via PEG tube PEG tube site appears stable.    3. Neurogenic bladder. New supra-pubic catheter placed by IR, antibiotics as in #1 above. Urology following.    4. Severe potentially malnutrition. Dietitian consulted. Feeding and supplements along with all the medications via PEG tube.    5. Underlying history of asthma. No acute issues oxygen nebulizer treatments as needed.      DVT Prophylaxis Heparin    AM Labs Ordered, also please review Full Orders  Family Communication: Admission, patients condition and plan of care  including tests being ordered have been discussed with the patient and wife who indicate understanding and agree with the plan and Code Status.  Code Status Full  Likely DC to  Home  Condition GUARDED     Time spent in minutes : 35    SINGH,PRASHANT K M.D on 06/29/2013 at 2:23 PM  Between 7am to 7pm - Pager - [phone]  After 7pm go to [email or web address] - password [email or web address]  And look for the night coverage person covering me after hours  [facility] Office  [phone]   **Disclaimer: This note may have been dictated with voice recognition software. Similar sounding words can inadvertently be transcribed and this note may contain transcription errors which may not have been corrected upon publication of note.**

## 2013-06-29 NOTE — Consult Note (Addendum)
Consult: Neurogenic bladder, SP tube placement Requested by: Dr. Dorie Rank   History of Present Illness: 38 yo with severe MS here with wife and caregiver. Pt seen by Dr. Diona Fanti, Dr. Risa Grill in Mar 2013 and Apr 2013 with h/o UTI, neurogenic bladder, bladder stones. Per his wife a SP tube was placed at [facility], but I did not see any Epic notes from [facility]. On further review, [facility] notes are for botox injections for the contractures. Jan 2014 CT shows bladder stones resolved and Sp tube in place. Jan 2015 CT shows no bladder stones and SP tube in place. Dr. Matilde Sprang changed the SP tube Jan 2015. LE contracture over time appear to be progressive.    Over recent months per caregiver, the suprapubic tube has required frequent changes. It was changed last Monday and apparently was not draining well. Therefore it was removed and changed Friday or 3 days ago. It never drained and was removed yesterday. The emergency department staff tried replace the SP tube and could not.  Past Medical History  Diagnosis Date  . MS (multiple sclerosis)   . 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 Childhood asthma   . Depression   . Neuromuscular disorder     Quadraperesis  . Recurrent UTI   . Dysphagia   . Bladder calculi   . Neurogenic bladder   . Shortness of breath   . Recurrent upper respiratory infection (URI)   . Normocytic anemia 05/28/2011  . Aspiration pneumonia 01/20/2013   Past Surgical History  Procedure Laterality Date  . Lumbar puncture  10/12/2002  . Gastrostomy  04/16/2011    Procedure: GASTROSTOMY;  Surgeon: Zenovia Jarred, MD;  Location: [place];  Service: General;  Laterality: N/A;  Open G-Tube placement    Home Medications:   (Not in a hospital admission) Allergies: No Known Allergies  Family History  Problem Relation Age of Onset  . Asthma Mother    Social History:  reports that he quit smoking about 14 years ago. His smoking use included Cigarettes. He smoked 0.00  packs per day. He has quit using smokeless tobacco. He reports that he does not drink alcohol. His drug history is not on file.  ROS: A complete review of systems was performed.  All systems are negative except for pertinent findings as noted. Review of Systems  Unable to perform ROS    Physical Exam:  Vital signs in last 24 hours: Temp:  [98.9 F (37.2 C)] 98.9 F (37.2 C) (06/22 0905) Pulse Rate:  [93-104] 93 (06/22 1030) Resp:  [14-17] 14 (06/22 1030) BP: (110-121)/(78-79) 110/78 mmHg (06/22 1030) SpO2:  [95 %-99 %] 95 % (06/22 1030) Weight:  [81.647 kg (180 lb)] 81.647 kg (180 lb) (06/22 0905) General:  No acute distress, elderly appearing HEENT: Normocephalic, atraumatic Neck: No JVD or lymphadenopathy Cardiovascular: Regular rate and rhythm Lungs: Regular rate and effort Abdomen: Soft, nontender, nondistended, no abdominal masses; Caregiver held left leg up - prior SP tube site in ML scarred down and small. It would not accept a 16Fr foley. To the left lateral there is a newer hole with some granulation tissue and caregiver says the catheter she removed Friday was in this new hole.  Back: No CVA tenderness Extremities: No edema Neurologic: Non-responsive; contracted.   Laboratory Data:  Results for orders placed during the hospital encounter of 06/29/13 (from the past 24 hour(s))  BASIC METABOLIC PANEL     Status: Abnormal   Collection Time  06/29/13  9:06 AM      Result Value Ref Range   Sodium 139  137 - 147 mEq/L   Potassium 4.3  3.7 - 5.3 mEq/L   Chloride 97  96 - 112 mEq/L   CO2 23  19 - 32 mEq/L   Glucose, Bld 113 (*) 70 - 99 mg/dL   BUN 33 (*) 6 - 23 mg/dL   Creatinine, Ser 1.00  0.50 - 1.35 mg/dL   Calcium 10.1  8.4 - 10.5 mg/dL   GFR calc non Af Amer >90  >90 mL/min   GFR calc Af Amer >90  >90 mL/min  CBC WITH DIFFERENTIAL     Status: Abnormal   Collection Time    06/29/13  9:06 AM      Result Value Ref Range   WBC 17.7 (*) 4.0 - 10.5 K/uL   RBC 5.58   4.22 - 5.81 MIL/uL   Hemoglobin 15.8  13.0 - 17.0 g/dL   HCT 46.8  39.0 - 52.0 %   MCV 83.9  78.0 - 100.0 fL   MCH 28.3  26.0 - 34.0 pg   MCHC 33.8  30.0 - 36.0 g/dL   RDW 16.3 (*) 11.5 - 15.5 %   Platelets 334  150 - 400 K/uL   Neutrophils Relative % 83 (*) 43 - 77 %   Neutro Abs 14.5 (*) 1.7 - 7.7 K/uL   Lymphocytes Relative 7 (*) 12 - 46 %   Lymphs Abs 1.3  0.7 - 4.0 K/uL   Monocytes Relative 9  3 - 12 %   Monocytes Absolute 1.7 (*) 0.1 - 1.0 K/uL   Eosinophils Relative 1  0 - 5 %   Eosinophils Absolute 0.2  0.0 - 0.7 K/uL   Basophils Relative 0  0 - 1 %   Basophils Absolute 0.0  0.0 - 0.1 K/uL  I-STAT CG4 LACTIC ACID, ED     Status: None   Collection Time    06/29/13  9:41 AM      Result Value Ref Range   Lactic Acid, Venous 1.82  0.5 - 2.2 mmol/L  CBG MONITORING, ED     Status: Abnormal   Collection Time    06/29/13  9:52 AM      Result Value Ref Range   Glucose-Capillary 111 (*) 70 - 99 mg/dL   No results found for this or any previous visit (from the past 240 hour(s)). Creatinine:  Recent Labs  06/29/13 0906  CREATININE 1.00    Impression/Assessment/Plan: Neurogenic bladder with prior suprapubic cystotomy, but cystotomy appears to be closing down. I could not get any access. This would best be done with some fluoroscopic guidance. I discussed it with Dr. Jarvis Newcomer who will attempt perc SP tube placement. Pt may need referral back to [facility] for GU consult if IR not successful.     Festus Aloe 06/29/2013, 12:59 PM

## 2013-06-29 NOTE — Procedures (Signed)
Replacement 41f suprapubic catheter placed under fluoro No complication No blood loss. See complete dictation in [facility].

## 2013-06-29 NOTE — ED Notes (Addendum)
Pt is in IR, to replace subrapubic catheter.

## 2013-06-29 NOTE — ED Notes (Signed)
Phlebotomy at bedside.

## 2013-06-29 NOTE — ED Notes (Signed)
Contrast instilled in PEG tube. CT made aware.

## 2013-06-29 NOTE — ED Notes (Signed)
MD at bedside to insert catheter, was unable to insert. Paging urology.

## 2013-06-29 NOTE — ED Notes (Signed)
Resident to attempt to replace suprapubic catheter. 20 French catheter ordered from materials supply.

## 2013-06-29 NOTE — ED Provider Notes (Signed)
CSN: [id number]     Arrival date & time 06/29/13  6789 History   First MD Initiated Contact with Patient 06/29/13 [id number]     Chief Complaint  Patient presents with  . Altered Mental Status     (Consider location/radiation/quality/duration/timing/severity/associated sxs/prior Treatment) Patient is a 38 y.o. male presenting with altered mental status. The history is provided by the spouse and a caregiver.  Altered Mental Status Presenting symptoms: behavior changes   Severity:  Moderate Most recent episode:  Today Episode history:  Single Timing:  Constant Progression:  Worsening Chronicity:  New Associated symptoms: fever   Associated symptoms: no abdominal pain and no vomiting     38 yo male pw AMS and fever. Also with suprapubic foley catheter problems.  <24 hours of fever. tmax 100.0. Given tylenol. Ho recurrent UTI's. Foley catheter clogged last night. Changed at home. However, at that time did not realize that where there was initially one suprapubic hole there was now two. Based on discussion with wife and home health nurse, it sounds like suprapubic cath was placed through false lumen (?fistula). Only 25 cc of UOP overnight. Pulled by home health nurse this AM with some leakage of urine after removal. Attempted to place foley in original site but unable to pass catheter.  Patient more fatigued than usual. Typically responds to questions by blinking eyes. Non-verbal at baseline. Does not move arms or legs at baseline.  Home health nurse also reports cool left foot. Has not had previously. No prior vascular problems.    Past Medical History  Diagnosis Date  . MS (multiple sclerosis)   . 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 Childhood asthma   . Depression   . Neuromuscular disorder     Quadraperesis  . Recurrent UTI   . Dysphagia   . Bladder calculi   . Neurogenic bladder   . Shortness of breath   . Recurrent upper respiratory infection (URI)   . Normocytic anemia  05/28/2011  . Aspiration pneumonia 01/20/2013   Past Surgical History  Procedure Laterality Date  . Lumbar puncture  10/12/2002  . Gastrostomy  04/16/2011    Procedure: GASTROSTOMY;  Surgeon: Zenovia Jarred, MD;  Location: [facility];  Service: General;  Laterality: N/A;  Open G-Tube placement   Family History  Problem Relation Age of Onset  . Asthma Mother    History  Substance Use Topics  . Smoking status: Former Smoker    Types: Cigarettes    Quit date: 02/02/1999  . Smokeless tobacco: Former Systems developer  . Alcohol Use: No    Review of Systems  Unable to perform ROS: Patient nonverbal  Constitutional: Positive for fever. Negative for chills.  HENT: Positive for congestion.   Respiratory: Negative for cough and shortness of breath (coarse bs).   Cardiovascular: Negative for leg swelling.  Gastrointestinal: Negative for vomiting, abdominal pain and diarrhea.       G-tube working fine.  Genitourinary: Positive for difficulty urinating (chronic).      Allergies  Review of patient's allergies indicates no known allergies.  Home Medications   Prior to Admission medications   Medication Sig Start Date End Date Taking? Authorizing Sharece Fleischhacker  acetaminophen (TYLENOL) 160 MG/5ML elixir Take 40 mg by mouth every 4 (four) hours as needed for fever or pain.   Yes Historical Skipper Dacosta, MD  Amino Acids-Protein Hydrolys (FEEDING SUPPLEMENT, PRO-STAT SUGAR FREE 64,) LIQD Place 30 mLs into feeding tube 3 (three) times daily. 01/24/13  Yes Bonnielee Haff, MD  ascorbic  acid (VITAMIN C) 500 MG/5ML syrup Place 5 mLs (500 mg total) into feeding tube 2 (two) times daily. 01/24/13  Yes Bonnielee Haff, MD  baclofen (LIORESAL) 20 MG tablet Place 1 tablet (20 mg total) into feeding tube 3 (three) times daily. 01/24/13  Yes Bonnielee Haff, MD  buPROPion (WELLBUTRIN) 75 MG tablet Place 1 tablet (75 mg total) into feeding tube 3 (three) times daily. 01/24/13  Yes Bonnielee Haff, MD  cholecalciferol (VITAMIN D) 1000  UNITS tablet Take 1 tablet (1,000 Units total) by mouth 2 (two) times daily. 01/24/13  Yes Bonnielee Haff, MD  Cranberry Juice Powder 425 MG CAPS 1 capsule (425 mg total) by PEG Tube route 2 (two) times daily. 01/24/13  Yes Bonnielee Haff, MD  dantrolene (DANTRIUM) 100 MG capsule Place 200 mg into feeding tube daily.   Yes Historical Gelisa Tieken, MD  Diaper Rash Products (DESITIN EX) Apply 1 application topically as needed (may apply to minor skin breakdown until healed).   Yes Historical Arienne Gartin, MD  diazepam (VALIUM) 5 MG tablet Place 1 tablet (5 mg total) into feeding tube at bedtime. 01/24/13  Yes Bonnielee Haff, MD  fexofenadine (ALLEGRA) 180 MG tablet Place 180 mg into feeding tube daily.   Yes Historical Brandice Busser, MD  Fluconazole (DIFLUCAN PO) Place 1 tablet into feeding tube daily.   Yes Historical Frieda Arnall, MD  Garlic Oil (ODORLESS GARLIC) 811 MG TABS 572 mg by PEG Tube route daily.    Yes Historical Yara Tomkinson, MD  HYDROcodone-acetaminophen (NORCO) 7.5-325 MG per tablet Place 1 tablet into feeding tube every 6 (six) hours as needed (pain).   Yes Historical Hugo Lybrand, MD  ipratropium-albuterol (DUONEB) 0.5-2.5 (3) MG/3ML SOLN Take 3 mLs by nebulization every 4 (four) hours as needed (Shortness of breath).  02/12/13  Yes Historical Akiera Allbaugh, MD  Lactobacillus (ACIDOPHILUS PO) Place into feeding tube as directed.   Yes Historical Gavin Telford, MD  levETIRAcetam (KEPPRA) 100 MG/ML solution Place 1,000 mg into feeding tube 2 (two) times daily. 01/24/13  Yes Bonnielee Haff, MD  levETIRAcetam (KEPPRA) 500 MG tablet Place 1,000 mg into feeding tube 2 (two) times daily.   Yes Historical Adelle Zachar, MD  Multiple Vitamin (MULTIVITAMIN WITH MINERALS) TABS tablet Place 1 tablet into feeding tube daily. 01/24/13  Yes Bonnielee Haff, MD  Nutritional Supplements (FEEDING SUPPLEMENT, OSMOLITE 1.5 CAL,) LIQD Place 1,000 mLs into feeding tube continuous. At 65ml/HR 01/24/13  Yes Bonnielee Haff, MD  nystatin (MYCOSTATIN/NYSTOP)  100000 UNIT/GM POWD Apply 1 g topically 3 (three) times daily. 01/24/13  Yes Bonnielee Haff, MD  omeprazole (PRILOSEC) 2 mg/mL SUSP Place 20 mLs (40 mg total) into feeding tube daily. 01/24/13  Yes Bonnielee Haff, MD  oxybutynin (DITROPAN) 5 MG tablet Place 5 mg into feeding tube 2 (two) times daily.   Yes Historical Rahil Passey, MD  polyethylene glycol (MIRALAX / GLYCOLAX) packet Place 17 g into feeding tube daily. 01/24/13  Yes Bonnielee Haff, MD  polyvinyl alcohol (LIQUIFILM TEARS) 1.4 % ophthalmic solution Place 1 drop into both eyes 3 (three) times daily. 01/24/13  Yes Bonnielee Haff, MD  Water For Irrigation, Sterile (FREE WATER) SOLN Place 240 mLs into feeding tube every 4 (four) hours. 01/24/13  Yes Bonnielee Haff, MD   BP 111/79  Pulse 104  Temp(Src) 98.9 F (37.2 C) (Rectal)  Resp 15  Ht 6\' 4"  (1.93 m)  Wt 180 lb (81.647 kg)  BMI 21.92 kg/m2  SpO2 97% Physical Exam  Nursing note and vitals reviewed. Constitutional: No distress.  Laying in bed. Contractures. Non-verbal.  HENT:  Head: Normocephalic and atraumatic.  Eyes: Conjunctivae are normal. Pupils are equal, round, and reactive to light. Right eye exhibits no discharge. Left eye exhibits no discharge.  Neck: No tracheal deviation present.  Cardiovascular: Regular rhythm, normal heart sounds and intact distal pulses.   Tachycardic to 110 2+ DP pulse B/l  Pulmonary/Chest: Effort normal and breath sounds normal. No stridor. No respiratory distress. He has no wheezes. He has no rales.  Abdominal: Soft. He exhibits no distension. There is no tenderness. There is no guarding.  g-tube well appearing. suprapubic cath site with 2nd likely false lumen left on center. No drainage.  Musculoskeletal: He exhibits no edema and no tenderness.  Left foot cooler than right. Good color and cap refill. 2+ DP pulse.  Skin: Skin is warm and dry.    ED Course  Procedures (including critical care time) Labs Review Labs Reviewed  BASIC METABOLIC  PANEL - Abnormal; Notable for the following:    Glucose, Bld 113 (*)    BUN 33 (*)    All other components within normal limits  CBC WITH DIFFERENTIAL - Abnormal; Notable for the following:    WBC 17.7 (*)    RDW 16.3 (*)    Neutrophils Relative % 83 (*)    Neutro Abs 14.5 (*)    Lymphocytes Relative 7 (*)    Monocytes Absolute 1.7 (*)    All other components within normal limits  URINALYSIS, ROUTINE W REFLEX MICROSCOPIC - Abnormal; Notable for the following:    Color, Urine BROWN (*)    APPearance TURBID (*)    Hgb urine dipstick LARGE (*)    Protein, ur >300 (*)    Nitrite POSITIVE (*)    Leukocytes, UA LARGE (*)    All other components within normal limits  URINE MICROSCOPIC-ADD ON - Abnormal; Notable for the following:    Bacteria, UA MANY (*)    All other components within normal limits  CBG MONITORING, ED - Abnormal; Notable for the following:    Glucose-Capillary 111 (*)    All other components within normal limits  URINE CULTURE  CULTURE, BLOOD (ROUTINE X 2)  CULTURE, BLOOD (ROUTINE X 2)  LACTIC ACID, PLASMA  I-STAT CG4 LACTIC ACID, ED    Imaging Review Dg Chest Portable 1 View  06/29/2013   CLINICAL DATA:  ALTERED MENTAL STATUS  EXAM: PORTABLE CHEST - 1 VIEW  COMPARISON:  Portable chest 01/20/2013.  FINDINGS: Low lung volumes. The heart size and mediastinal contours are within normal limits. Both lungs are clear. The visualized skeletal structures are unremarkable.  IMPRESSION: No active disease.   Electronically Signed   By: Margaree Mackintosh M.D.   On: 06/29/2013 09:47     EKG Interpretation None      MDM   Final diagnoses:  Sepsis, due to unspecified organism    38 yo male with change in mental status. Difficulty with foley. Recurrent UTI's in past (proteus sensitive to ceftriaxone). Unable to replace suprapubic cath at bedside. Urology consulted. Unable to place. IR consulted for placement. Will empirically treat with rocephin for presumed UTI in patient  with leukocytosis and tachycardia. Cultures ordered and to be obtained prior to abx.  No head trauma. Will hold on CT head at this time. Non-verbal at baseline.  Medicine consulted for admission while awaiting IR placement of foley.   Patient with suprapubic cath placed. UA cw UTI. Already received rocephin. Awaiting admission.   Labs and imaging reviewed by myself and considered in  medical decision making if ordered. Imaging interpreted by radiology.   Discussed case with Dr. Tomi Bamberger who is in agreement with assessment and plan.        Bonnita Hollow, MD 06/29/13 [phone]

## 2013-06-29 NOTE — ED Notes (Signed)
Phlebotomy unable to get blood cultures. Admitting MD made aware. Reports can go ahead and given antibiotics. Also to place a PICC line.

## 2013-06-29 NOTE — Progress Notes (Signed)
Physician notified: Candiss Norse  At: 1858  Regarding: OK for lab to have foot stick to get blood cultures? Awaiting return response.   Returned Response at: 1902  Order(s): OK for foot stick lab draws.

## 2013-06-29 NOTE — Progress Notes (Signed)
1610: report taken from ED RN. Pt currently in CT.   IV team called to get on list for PICC insertion. Spouse at bedside to sign consent.

## 2013-06-29 NOTE — ED Provider Notes (Signed)
I saw and evaluated the patient, reviewed the resident's note and I agree with the findings and plan.  On exam pt has a small ulcerative lesion adjacent to his urostomy tube.  ?fistula  Does have elevated WBC and tachycardia.  Normal bp and lactic acid level.  We were unable to get a foley catheter placed.  Dr Alger Simons , urology , was unable as well.  Plan on IR placement.  Pt started on IV abx to cover for probable uti.  Admit for further treatment  Dorie Rank, MD 06/29/13 1622

## 2013-06-29 NOTE — ED Notes (Signed)
NOTIFIED J. KNAPP IN PERSON FOR PATIENTS LAB RESULTS OF CG4+ LACTIC ACID ,@09 :45 AM ,06/29/2013.

## 2013-06-30 ENCOUNTER — Inpatient Hospital Stay (HOSPITAL_COMMUNITY): Payer: Medicare HMO

## 2013-06-30 DIAGNOSIS — R4182 Altered mental status, unspecified: Secondary | ICD-10-CM | POA: Diagnosis not present

## 2013-06-30 DIAGNOSIS — N210 Calculus in bladder: Secondary | ICD-10-CM

## 2013-06-30 DIAGNOSIS — A4159 Other Gram-negative sepsis: Secondary | ICD-10-CM | POA: Diagnosis not present

## 2013-06-30 DIAGNOSIS — L8992 Pressure ulcer of unspecified site, stage 2: Secondary | ICD-10-CM

## 2013-06-30 DIAGNOSIS — L89109 Pressure ulcer of unspecified part of back, unspecified stage: Secondary | ICD-10-CM

## 2013-06-30 LAB — BASIC METABOLIC PANEL
BUN: 18 mg/dL (ref 6–23)
CHLORIDE: 104 meq/L (ref 96–112)
CO2: 23 mEq/L (ref 19–32)
Calcium: 8.6 mg/dL (ref 8.4–10.5)
Creatinine, Ser: 0.68 mg/dL (ref 0.50–1.35)
GFR calc Af Amer: >90 mL/min (ref >90)
GFR calc non Af Amer: >90 mL/min (ref >90)
Glucose, Bld: 81 mg/dL (ref 70–99)
Potassium: 4 mEq/L (ref 3.7–5.3)
Sodium: 140 mEq/L (ref 137–147)

## 2013-06-30 LAB — GLUCOSE, CAPILLARY
GLUCOSE-CAPILLARY: 144 mg/dL — AB (ref 70–99)
Glucose-Capillary: 86 mg/dL (ref 70–99)
Glucose-Capillary: 96 mg/dL (ref 70–99)
Glucose-Capillary: 99 mg/dL (ref 70–99)

## 2013-06-30 LAB — CBC
HEMATOCRIT: 37.1 % — AB (ref 39.0–52.0)
Hemoglobin: 12.1 g/dL — ABNORMAL LOW (ref 13.0–17.0)
MCH: 27.6 pg (ref 26.0–34.0)
MCHC: 32.6 g/dL (ref 30.0–36.0)
MCV: 84.7 fL (ref 78.0–100.0)
Platelets: 254 10*3/uL (ref 150–400)
RBC: 4.38 MIL/uL (ref 4.22–5.81)
RDW: 16.4 % — AB (ref 11.5–15.5)
WBC: 11.9 10*3/uL — ABNORMAL HIGH (ref 4.0–10.5)

## 2013-06-30 MED ORDER — ADULT MULTIVITAMIN LIQUID CH
5.0000 mL | Freq: Every day | ORAL | Status: DC
  Administered 2013-06-30 – 2013-07-03 (×4): 5 mL
  Filled 2013-06-30 (×4): qty 5

## 2013-06-30 MED ORDER — IPRATROPIUM-ALBUTEROL 0.5-2.5 (3) MG/3ML IN SOLN: 3.0000 mL | RESPIRATORY_TRACT | Status: DC | PRN

## 2013-06-30 MED ORDER — OSMOLITE 1.5 CAL PO LIQD
1000.0000 mL | ORAL | Status: DC
  Administered 2013-06-30 – 2013-07-03 (×4): 1000 mL
  Filled 2013-06-30 (×7): qty 1000

## 2013-06-30 MED ORDER — PRO-STAT SUGAR FREE PO LIQD
30.0000 mL | Freq: Three times a day (TID) | ORAL | Status: DC
  Administered 2013-06-30: 30 mL
  Administered 2013-06-30: 16:00:00
  Administered 2013-07-01 – 2013-07-03 (×7): 30 mL
  Filled 2013-06-30 (×12): qty 30

## 2013-06-30 NOTE — Progress Notes (Signed)
UR Completed Brenda Graves-Bigelow, RN,BSN [phone]  

## 2013-06-30 NOTE — Progress Notes (Signed)
Physician notified: Sherral Hammers At: 1328  Regarding:  Wife at bedside, has questions concerning central line. would like to speak with you. IR has pt scheduled for 1430 Awaiting return response.   Returned Response at: 1345, spoke with [name], spouse, on phone.

## 2013-06-30 NOTE — Procedures (Signed)
LUE PICC SVC RA No comp

## 2013-06-30 NOTE — Progress Notes (Signed)
INITIAL NUTRITION ASSESSMENT  DOCUMENTATION CODES Per approved criteria  -Not Applicable   INTERVENTION: 1.Osmolite 1.5 @ 51ml/hr.   2.Continue Prostat 30 ml TID.   Goal regimine to provide 2280 kcals, 128 gm protein, 1006 ml free water daily.   3.Continue home free water flushes of 240 ml q 4 hours to provide an additional 1440 ml free water daily.   4.MVI daily   NUTRITION DIAGNOSIS: Inadequate oral intake related to inability to eat as evidenced by NPO status  Goal: Pt to meet >/= 90% of their estimated nutrition needs   Monitor:  TF tolerance/adequacy, weight trend, labs   Reason for Assessment: Consult received to initiate and manage enteral nutrition support.  38 y.o. male  Admitting Dx: Sepsis(995.91)  ASSESSMENT: Terry Palmer is a 38 y.o. 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 Pt admitted with sepsis most likely from UTI.   Nutrition Focused Physical Exam:  Subcutaneous Fat:  Orbital Region: WNL Upper Arm Region: WNL Thoracic and Lumbar Region: WNL  Muscle:  Temple Region: WNL Clavicle Bone Region: WNL Clavicle and Acromion Bone Region: WNL Scapular Bone Region: NA Dorsal Hand: NA Patellar Region: NA Anterior Thigh Region: NA Posterior Calf Region: NA  Edema: not present   Height: Ht Readings from Last 1 Encounters:  06/29/13 6\' 4"  (1.93 m)    Weight: Wt Readings from Last 1 Encounters:  06/30/13 181 lb 3.5 oz (82.2 kg)    Ideal Body Weight: 182 lb   % Ideal Body Weight: 99%  Wt Readings from Last 10 Encounters:  06/30/13  181 lb 3.5 oz (82.2 kg)  01/26/13 210 lb 1.6 oz (95.3 kg)  12/15/12 195 lb 8.8 oz (88.7 kg)  01/29/12 183 lb 3.2 oz (83.1 kg)  05/31/11 145 lb 15.1 oz (66.2 kg)  04/23/11 166 lb (75.297 kg)  04/18/11 160 lb 15 oz (73 kg)  04/18/11 160 lb 15 oz (73 kg)  03/26/11 186 lb 15.2 oz (84.8 kg)  03/26/11 186 lb 15.2 oz (84.8 kg)    Usual Body Weight: 183 lb   % Usual Body Weight: 99%  BMI:  Body mass index is 22.07 kg/(m^2).  Estimated Nutritional Needs: Kcal: 2050-2250  Protein: 115-130 grams  Fluid: >2.3 L/day  Skin:  Blood blister Left heel blister  Diet Order: NPO  EDUCATION NEEDS: -No education needs identified at this time   Intake/Output Summary (Last 24 hours) at 06/30/13 1024 Last data filed at 06/30/13 0756  Gross per 24 hour  Intake   2325 ml  Output   1050 ml  Net   1275 ml    Last BM: 6/22   Labs:   Recent Labs Lab 06/29/13 0906 06/30/13 0220  NA 139 140  K 4.3 4.0  CL 97 104  CO2 23 23  BUN 33* 18  CREATININE 1.00 0.68  CALCIUM 10.1 8.6  GLUCOSE 113* 81    CBG (last 3)   Recent Labs  06/29/13 0952 06/29/13 2346  GLUCAP 111* 86    Scheduled Meds: . acidophilus  2 capsule Oral Daily  . baclofen  20 mg Per Tube TID  . buPROPion  75 mg Per Tube TID  . cholecalciferol  1,000 Units Oral BID  . dantrolene  200 mg Per Tube Daily  . diazepam  5 mg Per Tube QHS  . fluconazole  100 mg Oral QHS  . free water  240 mL Per Tube Q4H  . heparin  5,000 Units Subcutaneous 3 times per day  . ipratropium-albuterol  3 mL Nebulization QID  . levETIRAcetam  750 mg Per Tube BID  . liver oil-zinc oxide   Topical TID  . meropenem (MERREM) IV  1 g Intravenous 3 times per day  . nystatin  1 g Topical TID  . omeprazole  40 mg Per Tube Daily  . polyethylene glycol  17 g Per Tube Daily    Continuous Infusions: . sodium chloride 125 mL/hr at 06/30/13 2111    Past Medical History  Diagnosis Date  . MS (multiple sclerosis)   . Quadriparesis (muscle  weakness) 03/12/2011  . Childhood asthma   . Depression   . Neuromuscular disorder     Quadraperesis  . Recurrent UTI   . Dysphagia   . Bladder calculi   . Neurogenic bladder   . Shortness of breath   . Recurrent upper respiratory infection (URI)   . Normocytic anemia 05/28/2011  . Aspiration pneumonia 01/20/2013    Past Surgical History  Procedure Laterality Date  . Lumbar puncture  10/12/2002  . Gastrostomy  04/16/2011    Procedure: GASTROSTOMY;  Surgeon: Zenovia Jarred, MD;  Location: [place];  Service: General;  Laterality: N/A;  Open G-Tube placement    [place], [place], [place] Pager [phone] After Hours Pager

## 2013-06-30 NOTE — Progress Notes (Signed)
Spoke with primary RN informing her that IV Team would not be able to insert a PICC due to his severe contractures.  Referred this patient to IR with suggestion of inserting an IJ line due to contractures.  I spoke with IR and "We will take care of it"   I contacted the primary RN on 3S and made her aware that IR would be inserting PICC.   The order would need to be changed for IR to insert instead of the IV Team.

## 2013-06-30 NOTE — Care Management Note (Signed)
    Page 1 of 1   07/03/2013     12:46:18 PM CARE MANAGEMENT NOTE 07/03/2013  Patient:  Terry Palmer, Terry Palmer   Account Number:  [id number]  Date Initiated:  06/30/2013  Documentation initiated by:  GRAVES-BIGELOW,BRENDA  Subjective/Objective Assessment:   Pt admitted for fever and abdominal pain. Pt- advanced MS-has functional quadriplegia due to severe 4 extremity contractures due to MS and is largely bedbound. Pt is from home with spouse. Has a caregiver from [place] in [place].     Action/Plan:   CM will call and clarify services.   Anticipated DC Date:  07/03/2013   Anticipated DC Plan:  [place]  CM consult      [facility] Choice  HOME HEALTH   Choice offered to / List presented to:  C-3 Spouse        HH arranged  HH-1 RN  HH-10 DISEASE MANAGEMENT      [place] agency  OTHER - SEE NOTE   Status of service:  Completed, signed off Medicare Important Message given?  YES (If response is "NO", the following Medicare IM given date fields will be blank) Date Medicare IM given:  07/03/2013 Date Additional Medicare IM given:    Discharge Disposition:  [place]  Per UR Regulation:  Reviewed for med. necessity/level of care/duration of stay  If discussed at [place] of Stay Meetings, dates discussed:    Comments:  07-03-13 1231 Jacqlyn Krauss, RN,BSN [phone] CM did speak to Referrals at [facility] in [place]. CM did make them aware that pt will be d/c home today. Pt's PIcc line to be d/c before d/c. Pt will have antibiotics via peg tube and [place] agency to manage foley and peg care. CM to fax information to [facility] agency and they are still trying to find adequate staff to assist pt throughout the evening. [facility] will call CM back to make sure staffing is appropriate for safe d/c plan. Will continue to f/u.  CSW to assist with transportation needs.

## 2013-06-30 NOTE — Progress Notes (Signed)
[place] [facility] [place] TEAM 1 - Stepdown/ICU TEAM Progress Note  Terry Palmer IRC:[id number] DOB: [birth year] DOA: 06/29/2013 PCP: Reymundo Poll, MD  Admit HPI / Brief Narrative: Terry Palmer is a 70 y.oBM PMHx advanced Multiple Sclerosis, Functional Quadriplegia due to severe 4 extremity contractures due to multiple sclerosis and is largely bedbound, Dysphagia and cannot phonate due to muscle problems and coming case with eye blink, has a PEG tube for feeding, Neurogenic  with suprapubic catheter placed dialyzed urology, recurrent UTIs with sepsis in the past, history of decubitus ulcer in the sacral area which is currently healed, chronic stable asthma, history of bladder calculi.   Patient with above history underwent suprapubic catheter change few days ago by one of the home health nurses, apparently this new catheter was not draining, upon evaluation it was found that the catheter was probably inserted into a fistulous opening next to the suprapubic catheter opening site, he started running fevers yesterday at home and had no drainage out of his new catheter site, he was then brought to the ER.  In the ER he was found to have leukocytosis, low-grade fever and hypotension, urology was called to replace suprapubic catheter but they failed as likely the old suprapubic catheter opening had clogged off, IR was requested and a new suprapubic catheter was placed, I was called to admit the patient.  HPI/Subjective: 6/23 patient will open eyes when his name is called. Noncommunicative other than to moan in pain when extremities moved.    Assessment/Plan: Sepsis - most likely secondary to urinary retention with a UTI  enterococcal UTI in the past -Blood culture/urine culture pending -Continue IV meropenem dosed by pharmacy, -urology is already on board. -New suprapubic cath placed 6/22  -CT scan abdomen and pelvis showed cystitis/ascending UTI see results below  Multiple sclerosis with quadriplegia and  4 extremity severe contractures with aphasia -Obtain PICC line - Continue supportive care with muscle relaxants -all feeding and medications via PEG tube   Neurogenic bladder - New supra-pubic catheter placed by IR -Continue antibiotics, see #1; Urology following.    Severe potentially malnutrition. -Dietitian consulted. Await recommendations  -Continue feeding supplement (OSMOLITE 1.5 CAL)   Underlying history of asthma.  No acute issues oxygen nebulizer treatments as needed.       Code Status: FULL Family Communication: no family present at time of exam Disposition Plan: Resolution of sepsis    Consultants: Dr. Toni Amend loss/Dr. Dara Hoyer (interventional radiology)  Procedure/Significant Events: 6/22 CT abdomen pelvis with contrast  -Thickened enhancing bladder wall with bilateral hydroureter and ureteral enhancement compatible with cystitis/ascending urinary tract infection.  -No obstructing ureteral calculus.  -Nonobstructing left nephrolithiasis  -Gastrostomy within the stomach.    Culture 6/22 urine pending 6/22 MRSA by PCR negative 6/22 blood left/right foot NGTD   Antibiotics: Diflucan 6/22 >> Meropenem 6/22>>    DVT prophylaxis: Heparin   Devices 6/22 Suprapubic cath 14 f placed by IR   PEG tube   LINES / TUBES:  6/22  22 ga right hand 6/23 double-lumen midline PICC right brachial    Continuous Infusions: . feeding supplement (OSMOLITE 1.5 CAL) 1,000 mL (06/30/13 1617)    Objective: VITAL SIGNS: Temp: 98.1 F (36.7 C) (06/23 1700) Temp src: Axillary (06/23 1700) BP: 115/73 mmHg (06/23 1622) Pulse Rate: 86 (06/23 1622) SPO2; FIO2:   Intake/Output Summary (Last 24 hours) at 06/30/13 1823 Last data filed at 06/30/13 1700  Gross per 24 hour  Intake 3714.42 ml  Output   2175 ml  Net 1539.42 ml     Exam: General: No acute respiratory distress Lungs: Clear to auscultation bilaterally without wheezes or  crackles Cardiovascular: Regular rate and rhythm without murmur gallop or rub normal S1 and S2 Renalbalance today;        /overall;        Creatinine ;        Hourly output   Abdomen: Nontender, nondistended, soft, bowel sounds positive, no rebound, no ascites, no appreciable mass Extremities: No significant cyanosis, clubbing, or edema bilateral lower extremities  Data Reviewed: Basic Metabolic Panel:  Recent Labs Lab 06/29/13 0906 06/30/13 0220  NA 139 140  K 4.3 4.0  CL 97 104  CO2 23 23  GLUCOSE 113* 81  BUN 33* 18  CREATININE 1.00 0.68  CALCIUM 10.1 8.6   Liver Function Tests: No results found for this basename: AST, ALT, ALKPHOS, BILITOT, PROT, ALBUMIN,  in the last 168 hours No results found for this basename: LIPASE, AMYLASE,  in the last 168 hours No results found for this basename: AMMONIA,  in the last 168 hours CBC:  Recent Labs Lab 06/29/13 0906 06/30/13 0220  WBC 17.7* 11.9*  NEUTROABS 14.5*  --   HGB 15.8 12.1*  HCT 46.8 37.1*  MCV 83.9 84.7  PLT 334 254   Cardiac Enzymes: No results found for this basename: CKTOTAL, CKMB, CKMBINDEX, TROPONINI,  in the last 168 hours BNP (last 3 results) No results found for this basename: PROBNP,  in the last 8760 hours CBG:  Recent Labs Lab 06/29/13 0952 06/29/13 2346  GLUCAP 111* 86    Recent Results (from the past 240 hour(s))  URINE CULTURE     Status: None   Collection Time    06/29/13  2:29 PM      Result Value Ref Range Status   Specimen Description URINE, SUPRAPUBIC   Final   Special Requests NONE   Final   Culture  Setup Time     Final   Value: 06/29/2013 14:46     Performed at [facility] PENDING   Incomplete   Culture     Final   Value: Culture reincubated for better growth     Performed at [facility]   Report Status PENDING   Incomplete  MRSA PCR SCREENING     Status: None   Collection Time    06/29/13  5:15 PM      Result Value Ref Range Status   MRSA  by PCR NEGATIVE  NEGATIVE Final   Comment:            The GeneXpert MRSA Assay (FDA     approved for NASAL specimens     only), is one component of a     comprehensive MRSA colonization     surveillance program. It is not     intended to diagnose MRSA     infection nor to guide or     monitor treatment for     MRSA infections.  CULTURE, BLOOD (ROUTINE X 2)     Status: None   Collection Time    06/29/13  7:10 PM      Result Value Ref Range Status   Specimen Description BLOOD LEFT FOOT   Final   Special Requests BOTTLES DRAWN AEROBIC ONLY 5CC   Final   Culture  Setup Time     Final   Value: 06/29/2013 22:26     Performed at [facility]  Culture     Final   Value:        BLOOD CULTURE RECEIVED NO GROWTH TO DATE CULTURE WILL BE HELD FOR 5 DAYS BEFORE ISSUING A FINAL NEGATIVE REPORT     Performed at [facility]   Report Status PENDING   Incomplete  CULTURE, BLOOD (ROUTINE X 2)     Status: None   Collection Time    06/29/13  7:25 PM      Result Value Ref Range Status   Specimen Description BLOOD RIGHT FOOT   Final   Special Requests BOTTLES DRAWN AEROBIC ONLY 5CC   Final   Culture  Setup Time     Final   Value: 06/29/2013 22:26     Performed at [facility]   Culture     Final   Value:        BLOOD CULTURE RECEIVED NO GROWTH TO DATE CULTURE WILL BE HELD FOR 5 DAYS BEFORE ISSUING A FINAL NEGATIVE REPORT     Performed at [facility]   Report Status PENDING   Incomplete     Studies:  Recent x-ray studies have been reviewed in detail by the Attending Physician  Scheduled Meds:  Scheduled Meds: . acidophilus  2 capsule Oral Daily  . baclofen  20 mg Per Tube TID  . buPROPion  75 mg Per Tube TID  . cholecalciferol  1,000 Units Oral BID  . dantrolene  200 mg Per Tube Daily  . diazepam  5 mg Per Tube QHS  . feeding supplement (PRO-STAT SUGAR FREE 64)  30 mL Per Tube TID  . fluconazole  100 mg Oral QHS  . free water  240 mL Per Tube Q4H  .  heparin  5,000 Units Subcutaneous 3 times per day  . levETIRAcetam  750 mg Per Tube BID  . liver oil-zinc oxide   Topical TID  . meropenem (MERREM) IV  1 g Intravenous 3 times per day  . multivitamin  5 mL Per Tube Daily  . nystatin  1 g Topical TID  . omeprazole  40 mg Per Tube Daily  . polyethylene glycol  17 g Per Tube Daily    Time spent on care of this patient: 40 mins   Allie Bossier , MD   [facility] Office  [phone] Pager [phone]  On-Call/Text Page:      [email or web address]      password [email or web address]  If 7PM-7AM, please contact night-coverage [email or web address] Password [email or web address] 06/30/2013, 6:23 PM   LOS: 1 day

## 2013-07-01 LAB — GLUCOSE, CAPILLARY
Glucose-Capillary: 153 mg/dL — ABNORMAL HIGH (ref 70–99)
Glucose-Capillary: 116 mg/dL — ABNORMAL HIGH (ref 70–99)
Glucose-Capillary: 124 mg/dL — ABNORMAL HIGH (ref 70–99)
Glucose-Capillary: 117 mg/dL — ABNORMAL HIGH (ref 70–99)
Glucose-Capillary: 93 mg/dL (ref 70–99)

## 2013-07-01 MED ORDER — HYDROCODONE-ACETAMINOPHEN 5-325 MG PO TABS: 1.0000 | ORAL_TABLET | ORAL | Status: DC | PRN

## 2013-07-01 MED ORDER — GUAIFENESIN-DM 100-10 MG/5ML PO SYRP: 5.0000 mL | ORAL_SOLUTION | ORAL | Status: DC | PRN

## 2013-07-01 MED ORDER — FLUCONAZOLE 100 MG PO TABS
100.0000 mg | ORAL_TABLET | Freq: Every day | ORAL | Status: DC
  Administered 2013-07-01 – 2013-07-02 (×2): 100 mg
  Filled 2013-07-01 (×3): qty 1

## 2013-07-01 MED ORDER — ONDANSETRON HCL 4 MG PO TABS: 4.0000 mg | ORAL_TABLET | Freq: Four times a day (QID) | ORAL | Status: DC | PRN

## 2013-07-01 MED ORDER — ONDANSETRON HCL 4 MG/2ML IJ SOLN: 4.0000 mg | Freq: Four times a day (QID) | INTRAMUSCULAR | Status: DC | PRN

## 2013-07-01 NOTE — Progress Notes (Signed)
[place] TEAM 1 - Stepdown/ICU TEAM Progress Note  Terry Palmer KWI:[id number] DOB: 06-20-1975 DOA: 06/29/2013 PCP: Reymundo Poll, MD  Admit HPI / Brief Narrative: 38 y.o M w/ Hx advanced Multiple Sclerosis, Functional Quadriplegia due to severe 4 extremity contractures due to multiple sclerosis (bedbound), dysphagia with inability to phonate, s/p PEG tube, neurogenic  blader with suprapubic catheter, recurrent UTIs with sepsis in the past, history of a decubitus ulcer in the sacral area which is currently healed, chronic stable asthma, and a history of bladder calculi who underwent suprapubic catheter change a few days prior to his admission by a home health nurse.  This new catheter was not draining, and it was found that the catheter was probably inserted into a fistulous opening next to the suprapubic catheter opening site.  He started running fevers at home and had no drainage out of his new catheter site, so he was brought to the ER.   In the ER he was found to have leukocytosis, with a low-grade fever and hypotension.  Urology was called to replace the suprapubic catheter but they failed as likely the old suprapubic catheter opening had clogged off.  IR then placed a new suprapubic catheter.  HPI/Subjective: Pt is non communicative.  No family present in room.    Assessment/Plan:  Sepsis due to Proteus UTI -secondary to urinary retention with a UTI  -CT scan abdomen and pelvis showed cystitis/ascending UTI  -awaiting sensitivities to narrow abx spectrum   Multiple sclerosis with quadriplegia and 4 extremity severe contractures with aphasia -PICC line now in place  -Continue supportive care with muscle relaxants -all feeding and medications via PEG tube   Neurogenic bladder -New supra-pubic catheter placed by IR 6/22  Nutrition  -Dietitian consulted. Await recommendations  -Continue feeding supplement (OSMOLITE 1.5 CAL)   Underlying history of asthma No acute issues -  nebulizer treatments as needed.   Code Status: FULL Family Communication: no family present at time of exam Disposition Plan: SDU   Consultants: Dr. Toni Amend loss/Dr. Dara Hoyer (interventional radiology)  Procedure/Significant Events: 6/22 CT abdomen pelvis with contrast  -Thickened enhancing bladder wall with bilateral hydroureter and ureteral enhancement compatible with cystitis/ascending urinary tract infection.  -No obstructing ureteral calculus.  -Nonobstructing left nephrolithiasis  -Gastrostomy within the stomach.  Antibiotics: Diflucan 6/22 >> Meropenem 6/22>>  DVT prophylaxis: SQ Heparin  Devices 6/22 Suprapubic cath 14 f placed by IR   PEG tube   Objective: Blood pressure 117/74, pulse 93, temperature 99.4 F (37.4 C), temperature source Axillary, resp. rate 19, height 6\' 4"  (1.93 m), weight 81.9 kg (180 lb 8.9 oz), SpO2 100.00%.  Intake/Output Summary (Last 24 hours) at 07/01/13 1700 Last data filed at 07/01/13 1500  Gross per 24 hour  Intake   2065 ml  Output   2575 ml  Net   -510 ml   Exam: General: No acute respiratory distress - pt is non communicative - does not appear to be in resp distress or pain Lungs: Clear to auscultation bilaterally without wheezes or crackles Cardiovascular: Regular rate and rhythm without murmur gallop or rub normal S1 and S2 Abdomen: Nondistended, soft, bowel sounds positive, no rebound, no ascites, no appreciable mass Extremities: No significant cyanosis, clubbing, or edema bilateral lower extremities  Data Reviewed: Basic Metabolic Panel:  Recent Labs Lab 06/29/13 0906 06/30/13 0220  NA 139 140  K 4.3 4.0  CL 97 104  CO2 23 23  GLUCOSE 113* 81  BUN 33* 18  CREATININE 1.00  0.68  CALCIUM 10.1 8.6   Liver Function Tests: No results found for this basename: AST, ALT, ALKPHOS, BILITOT, PROT, ALBUMIN,  in the last 168 hours No results found for this basename: LIPASE, AMYLASE,  in the last 168 hours No results found  for this basename: AMMONIA,  in the last 168 hours  CBC:  Recent Labs Lab 06/29/13 0906 06/30/13 0220  WBC 17.7* 11.9*  NEUTROABS 14.5*  --   HGB 15.8 12.1*  HCT 46.8 37.1*  MCV 83.9 84.7  PLT 334 254   CBG:  Recent Labs Lab 06/30/13 2327 07/01/13 0317 07/01/13 0734 07/01/13 1112 07/01/13 1455  GLUCAP 144* 153* 116* 124* 117*    Recent Results (from the past 240 hour(s))  URINE CULTURE     Status: None   Collection Time    06/29/13  2:29 PM      Result Value Ref Range Status   Specimen Description URINE, SUPRAPUBIC   Final   Special Requests NONE   Final   Culture  Setup Time     Final   Value: 06/29/2013 14:46     Performed at [facility]     Final   Value: >=100,000 COLONIES/ML     Performed at [facility]   Culture     Final   Value: [place]     Performed at [facility]   Report Status PENDING   Incomplete  MRSA PCR SCREENING     Status: None   Collection Time    06/29/13  5:15 PM      Result Value Ref Range Status   MRSA by PCR NEGATIVE  NEGATIVE Final   Comment:            The GeneXpert MRSA Assay (FDA     approved for NASAL specimens     only), is one component of a     comprehensive MRSA colonization     surveillance program. It is not     intended to diagnose MRSA     infection nor to guide or     monitor treatment for     MRSA infections.  CULTURE, BLOOD (ROUTINE X 2)     Status: None   Collection Time    06/29/13  7:10 PM      Result Value Ref Range Status   Specimen Description BLOOD LEFT FOOT   Final   Special Requests BOTTLES DRAWN AEROBIC ONLY 5CC   Final   Culture  Setup Time     Final   Value: 06/29/2013 22:26     Performed at [facility]   Culture     Final   Value:        BLOOD CULTURE RECEIVED NO GROWTH TO DATE CULTURE WILL BE HELD FOR 5 DAYS BEFORE ISSUING A FINAL NEGATIVE REPORT     Performed at [facility]   Report Status PENDING    Incomplete  CULTURE, BLOOD (ROUTINE X 2)     Status: None   Collection Time    06/29/13  7:25 PM      Result Value Ref Range Status   Specimen Description BLOOD RIGHT FOOT   Final   Special Requests BOTTLES DRAWN AEROBIC ONLY 5CC   Final   Culture  Setup Time     Final   Value: 06/29/2013 22:26     Performed at [facility]  Final   Value:        BLOOD CULTURE RECEIVED NO GROWTH TO DATE CULTURE WILL BE HELD FOR 5 DAYS BEFORE ISSUING A FINAL NEGATIVE REPORT     Performed at [facility]   Report Status PENDING   Incomplete     Studies:  Recent x-ray studies have been reviewed in detail by the Attending Physician  Scheduled Meds:  Scheduled Meds: . acidophilus  2 capsule Oral Daily  . baclofen  20 mg Per Tube TID  . buPROPion  75 mg Per Tube TID  . cholecalciferol  1,000 Units Oral BID  . dantrolene  200 mg Per Tube Daily  . diazepam  5 mg Per Tube QHS  . feeding supplement (PRO-STAT SUGAR FREE 64)  30 mL Per Tube TID  . fluconazole  100 mg Oral QHS  . free water  240 mL Per Tube Q4H  . heparin  5,000 Units Subcutaneous 3 times per day  . levETIRAcetam  750 mg Per Tube BID  . liver oil-zinc oxide   Topical TID  . meropenem (MERREM) IV  1 g Intravenous 3 times per day  . multivitamin  5 mL Per Tube Daily  . nystatin  1 g Topical TID  . omeprazole  40 mg Per Tube Daily  . polyethylene glycol  17 g Per Tube Daily    Time spent on care of this patient: 35 mins  Cherene Altes, MD [facility] For Consults/Admissions - Flow Manager - [phone] Office  [phone] Pager [phone]  On-Call/Text Page:      [email or web address]      password [facility]  07/01/2013, 5:00 PM   LOS: 2 days

## 2013-07-02 DIAGNOSIS — E876 Hypokalemia: Secondary | ICD-10-CM

## 2013-07-02 LAB — CBC
HCT: 35.2 % — ABNORMAL LOW (ref 39.0–52.0)
Hemoglobin: 11.5 g/dL — ABNORMAL LOW (ref 13.0–17.0)
MCH: 27.7 pg (ref 26.0–34.0)
MCHC: 32.7 g/dL (ref 30.0–36.0)
MCV: 84.8 fL (ref 78.0–100.0)
PLATELETS: 242 10*3/uL (ref 150–400)
RBC: 4.15 MIL/uL — AB (ref 4.22–5.81)
RDW: 16.2 % — ABNORMAL HIGH (ref 11.5–15.5)
WBC: 8.2 10*3/uL (ref 4.0–10.5)

## 2013-07-02 LAB — COMPREHENSIVE METABOLIC PANEL
ALBUMIN: 3.1 g/dL — AB (ref 3.5–5.2)
ALT: 10 U/L (ref 0–53)
AST: 11 U/L (ref 0–37)
Alkaline Phosphatase: 109 U/L (ref 39–117)
BUN: 14 mg/dL (ref 6–23)
CHLORIDE: 106 meq/L (ref 96–112)
CO2: 25 meq/L (ref 19–32)
CREATININE: 0.54 mg/dL (ref 0.50–1.35)
Calcium: 8.8 mg/dL (ref 8.4–10.5)
GFR calc Af Amer: >90 mL/min (ref >90)
Glucose, Bld: 142 mg/dL — ABNORMAL HIGH (ref 70–99)
Potassium: 3.4 mEq/L — ABNORMAL LOW (ref 3.7–5.3)
SODIUM: 144 meq/L (ref 137–147)
Total Bilirubin: <0.2 mg/dL — ABNORMAL LOW (ref 0.3–1.2)
Total Protein: 6.9 g/dL (ref 6.0–8.3)

## 2013-07-02 LAB — GLUCOSE, CAPILLARY
GLUCOSE-CAPILLARY: 136 mg/dL — AB (ref 70–99)
Glucose-Capillary: 106 mg/dL — ABNORMAL HIGH (ref 70–99)
Glucose-Capillary: 107 mg/dL — ABNORMAL HIGH (ref 70–99)
Glucose-Capillary: 114 mg/dL — ABNORMAL HIGH (ref 70–99)
Glucose-Capillary: 129 mg/dL — ABNORMAL HIGH (ref 70–99)

## 2013-07-02 LAB — URINE CULTURE: Colony Count: >=100000

## 2013-07-02 MED ORDER — POTASSIUM CHLORIDE 20 MEQ/15ML (10%) PO LIQD
40.0000 meq | Freq: Once | ORAL | Status: AC
  Administered 2013-07-02: 40 meq
  Filled 2013-07-02: qty 30

## 2013-07-02 MED ORDER — FOSFOMYCIN TROMETHAMINE 3 G PO PACK
3.0000 g | PACK | ORAL | Status: DC
  Administered 2013-07-02: 3 g
  Filled 2013-07-02: qty 3

## 2013-07-02 NOTE — Progress Notes (Signed)
ANTIBIOTIC CONSULT NOTE - INITIAL  Pharmacy Consult for fosfomycin Indication: UTI  No Known Allergies  Patient Measurements: Height: 6\' 4"  (193 cm) Weight: 179 lb 7.3 oz (81.4 kg) IBW/kg (Calculated) : 86.8  Vital Signs: Temp: 98.3 F (36.8 C) (06/25 1131) Temp src: Oral (06/25 1131) BP: 122/80 mmHg (06/25 1131) Pulse Rate: 84 (06/25 1131) Intake/Output from previous day: 06/24 0701 - 06/25 0700 In: 2725 [NG/GT:2185; IV Piggyback:300] Out: 3000 [Urine:3000] Intake/Output from this shift: Total I/O In: 750 [NG/GT:750] Out: 1100 [Urine:1100]  Labs:  Recent Labs  06/30/13 0220 07/02/13 0120 07/02/13 0519  WBC 11.9* 8.2  --   HGB 12.1* 11.5*  --   PLT 254 242  --   CREATININE 0.68  --  0.54   Estimated Creatinine Clearance: 142.7 ml/min (by C-G formula based on Cr of 0.54). No results found for this basename: Letta Median, VANCORANDOM, [place], GENTPEAK, GENTRANDOM, TOBRATROUGH, TOBRAPEAK, TOBRARND, AMIKACINPEAK, AMIKACINTROU, AMIKACIN,  in the last 72 hours   Microbiology: Recent Results (from the past 720 hour(s))  URINE CULTURE     Status: None   Collection Time    06/29/13  2:29 PM      Result Value Ref Range Status   Specimen Description URINE, SUPRAPUBIC   Final   Special Requests NONE   Final   Culture  Setup Time     Final   Value: 06/29/2013 14:46     Performed at [place]     Final   Value: >=100,000 COLONIES/ML     Performed at [facility]   Culture     Final   Value: [place]     Performed at [facility]   Report Status 07/02/2013 FINAL   Final   Organism ID, Bacteria PROVIDENCIA STUARTII   Final   Organism ID, Bacteria PROTEUS MIRABILIS   Final  MRSA PCR SCREENING     Status: None   Collection Time    06/29/13  5:15 PM      Result Value Ref Range Status   MRSA by PCR NEGATIVE  NEGATIVE Final   Comment:            The GeneXpert MRSA Assay (FDA   approved for NASAL specimens     only), is one component of a     comprehensive MRSA colonization     surveillance program. It is not     intended to diagnose MRSA     infection nor to guide or     monitor treatment for     MRSA infections.  CULTURE, BLOOD (ROUTINE X 2)     Status: None   Collection Time    06/29/13  7:10 PM      Result Value Ref Range Status   Specimen Description BLOOD LEFT FOOT   Final   Special Requests BOTTLES DRAWN AEROBIC ONLY 5CC   Final   Culture  Setup Time     Final   Value: 06/29/2013 22:26     Performed at [facility]   Culture     Final   Value:        BLOOD CULTURE RECEIVED NO GROWTH TO DATE CULTURE WILL BE HELD FOR 5 DAYS BEFORE ISSUING A FINAL NEGATIVE REPORT     Performed at [facility]   Report Status PENDING   Incomplete  CULTURE, BLOOD (ROUTINE X 2)     Status: None  Collection Time    06/29/13  7:25 PM      Result Value Ref Range Status   Specimen Description BLOOD RIGHT FOOT   Final   Special Requests BOTTLES DRAWN AEROBIC ONLY 5CC   Final   Culture  Setup Time     Final   Value: 06/29/2013 22:26     Performed at [facility]   Culture     Final   Value:        BLOOD CULTURE RECEIVED NO GROWTH TO DATE CULTURE WILL BE HELD FOR 5 DAYS BEFORE ISSUING A FINAL NEGATIVE REPORT     Performed at [facility]   Report Status PENDING   Incomplete    Medical History: Past Medical History  Diagnosis Date  . MS (multiple sclerosis)   . 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 Childhood asthma   . Depression   . Neuromuscular disorder     Quadraperesis  . Recurrent UTI   . Dysphagia   . Bladder calculi   . Neurogenic bladder   . Shortness of breath   . Recurrent upper respiratory infection (URI)   . Normocytic anemia 05/28/2011  . Aspiration pneumonia 01/20/2013    Medications:  See electronic med rec  Assessment: 38 y.o. male presents with AMS and fever and suprapubic foley catheter problems. Pt  has a hx complicated UTI. His urine here has grown out proteus and providencia. The complicated thing is that PO option is very limited. Fosfomycin should be able to cover both. D/w Dr. Sherral Hammers, we'll change merrem to fosfomycin per PEG x2 doses. This will give him approximately 10 days of abx (Merrem + Fosfomycin)  Goal of Therapy:  Resolution of infection  Plan:   Dc Merrem Start Fosfomycin 3g per tube q72h x 2 doses

## 2013-07-02 NOTE — Progress Notes (Addendum)
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   Patient with above history underwent suprapubic catheter change few days ago by one of the home health nurses, apparently this new catheter was not draining, upon evaluation it was found that the catheter was probably inserted into a fistulous opening next to the suprapubic catheter opening site, he started running fevers yesterday at home and had no drainage out of his new catheter site, he was then brought to the ER.  In the ER he was found to have leukocytosis, low-grade fever and hypotension, urology was called to replace suprapubic catheter but they failed as likely the old suprapubic catheter opening had clogged off, IR was requested and a new suprapubic catheter was placed, I was called to admit the patient.  HPI/Subjective: 6/25 patient will open eyes when his name is called. Noncommunicative.    Assessment/Plan: Sepsis - most likely secondary to urinary retention with a UTI  enterococcal UTI in the past -urine culture positive GNR identified as PROTEUS MIRABILIS/ PROVIDENCIA STUARTII. Narrow antibiotics to fosfomycin PO.   -Blood culture NGTD -New suprapubic cath placed 6/22  -CT scan abdomen and pelvis showed cystitis/ascending UTI see results below -fosfomycin per PEG x2 doses (first dose today, next dose  6/28). This will give him approximately 10 days of abx (Merrem + Fosfomycin) -If patient tolerates antibiotic discharge in the a.m.   Multiple sclerosis with quadriplegia and 4 extremity severe contractures with aphasia -Obtain PICC line - Continue supportive care with muscle relaxants -all feeding and medications via PEG tube   Neurogenic bladder - New supra-pubic catheter placed by IR -Continue antibiotics, see #1; Urology following.    Severe potentially malnutrition. -Dietitian consulted. Await recommendations  -Continue feeding OSMOLITE 1.5 CAL at 54ml/hr   Underlying history of asthma.  No acute issues oxygen nebulizer treatments as needed.   Hypokalemia -Potassium 40 mEq liquid per tube x1      Code Status: FULL Family Communication: no family present at time of exam Disposition Plan: Resolution of sepsis    Consultants: Dr. Toni Amend loss/Dr. Dara Hoyer (interventional radiology)  Procedure/Significant Events: 6/22 CT abdomen pelvis with contrast  -Thickened enhancing bladder wall with bilateral hydroureter and ureteral enhancement compatible with cystitis/ascending urinary tract infection.  -No obstructing ureteral calculus.  -Nonobstructing left nephrolithiasis  -Gastrostomy within the stomach.    Culture 6/22 urine positive GNR identified as PROTEUS MIRABILIS, sensitivities pending 6/22 MRSA by PCR negative 6/22 blood left/right foot NGTD   Antibiotics: Diflucan 6/22 >> Meropenem 6/22>> stopped 6/25 Fosfomycin 6/25 >>    DVT prophylaxis: Heparin   Devices 6/22 Suprapubic cath 14 f placed by IR   PEG tube   LINES / TUBES:  6/22  22 ga right hand 6/23 double-lumen midline PICC right brachial    Continuous Infusions: . feeding supplement (OSMOLITE 1.5 CAL) 1,000 mL (06/30/13 1617)    Objective: VITAL SIGNS: Temp: 97.7 F (36.5  C) (06/25 0719) Temp src: Oral (06/25 0719) BP: 107/67 mmHg (06/25 0719) Pulse Rate: 82 (06/25  0719) SPO2; FIO2:   Intake/Output Summary (Last 24 hours) at 07/02/13 0901 Last data filed at 07/02/13 0719  Gross per 24 hour  Intake   2725 ml  Output   2925 ml  Net   -200 ml     Exam: General: Alert, follows commands to take deep breaths, No acute respiratory distress Lungs: Clear to auscultation bilaterally without wheezes or crackles Cardiovascular: Regular rate and rhythm without murmur gallop or rub normal S1 and S2 Abdomen: Nontender, nondistended, soft, bowel sounds positive, no rebound, no ascites, no appreciable mass Extremities: Contracture of all extremities (chronic), No significant cyanosis, clubbing, or edema bilateral lower extremities  Data Reviewed: Basic Metabolic Panel:  Recent Labs Lab 06/29/13 0906 06/30/13 0220 07/02/13 0519  NA 139 140 144  K 4.3 4.0 3.4*  CL 97 104 106  CO2 23 23 25   GLUCOSE 113* 81 142*  BUN 33* 18 14  CREATININE 1.00 0.68 0.54  CALCIUM 10.1 8.6 8.8   Liver Function Tests:  Recent Labs Lab 07/02/13 0519  AST 11  ALT 10  ALKPHOS 109  BILITOT <0.2*  PROT 6.9  ALBUMIN 3.1*   No results found for this basename: LIPASE, AMYLASE,  in the last 168 hours No results found for this basename: AMMONIA,  in the last 168 hours CBC:  Recent Labs Lab 06/29/13 0906 06/30/13 0220 07/02/13 0120  WBC 17.7* 11.9* 8.2  NEUTROABS 14.5*  --   --   HGB 15.8 12.1* 11.5*  HCT 46.8 37.1* 35.2*  MCV 83.9 84.7 84.8  PLT 334 254 242   Cardiac Enzymes: No results found for this basename: CKTOTAL, CKMB, CKMBINDEX, TROPONINI,  in the last 168 hours BNP (last 3 results) No results found for this basename: PROBNP,  in the last 8760 hours CBG:  Recent Labs Lab 07/01/13 1455 07/01/13 2002 07/01/13 2355 07/02/13 0332 07/02/13 0717  GLUCAP 117* 93 106* 136* 129*    Recent Results (from the past 240 hour(s))  URINE CULTURE     Status: None   Collection Time    06/29/13  2:29 PM      Result Value Ref Range Status   Specimen  Description URINE, SUPRAPUBIC   Final   Special Requests NONE   Final   Culture  Setup Time     Final   Value: 06/29/2013 14:46     Performed at [facility]     Final   Value: >=100,000 COLONIES/ML     Performed at [facility]   Culture     Final   Value: [place]     Performed at [facility]   Report Status PENDING   Incomplete  MRSA PCR SCREENING     Status: None   Collection Time    06/29/13  5:15 PM      Result Value Ref Range Status   MRSA by PCR NEGATIVE  NEGATIVE Final   Comment:            The GeneXpert MRSA Assay (FDA     approved for NASAL specimens     only), is one component of a     comprehensive MRSA colonization     surveillance program. It is not     intended to diagnose MRSA     infection nor to guide or  monitor treatment for     MRSA infections.  CULTURE, BLOOD (ROUTINE X 2)     Status: None   Collection Time    06/29/13  7:10 PM      Result Value Ref Range Status   Specimen Description BLOOD LEFT FOOT   Final   Special Requests BOTTLES DRAWN AEROBIC ONLY 5CC   Final   Culture  Setup Time     Final   Value: 06/29/2013 22:26     Performed at [facility]   Culture     Final   Value:        BLOOD CULTURE RECEIVED NO GROWTH TO DATE CULTURE WILL BE HELD FOR 5 DAYS BEFORE ISSUING A FINAL NEGATIVE REPORT     Performed at [facility]   Report Status PENDING   Incomplete  CULTURE, BLOOD (ROUTINE X 2)     Status: None   Collection Time    06/29/13  7:25 PM      Result Value Ref Range Status   Specimen Description BLOOD RIGHT FOOT   Final   Special Requests BOTTLES DRAWN AEROBIC ONLY 5CC   Final   Culture  Setup Time     Final   Value: 06/29/2013 22:26     Performed at [facility]   Culture     Final   Value:        BLOOD CULTURE RECEIVED NO GROWTH TO DATE CULTURE WILL BE HELD FOR 5 DAYS BEFORE ISSUING A FINAL NEGATIVE REPORT     Performed at [facility]   Report Status PENDING   Incomplete     Studies:  Recent x-ray studies have been reviewed in detail by the Attending Physician  Scheduled Meds:  Scheduled Meds: . baclofen  20 mg Per Tube TID  . buPROPion  75 mg Per Tube TID  . dantrolene  200 mg Per Tube Daily  . diazepam  5 mg Per Tube QHS  . feeding supplement (PRO-STAT SUGAR FREE 64)  30 mL Per Tube TID  . fluconazole  100 mg Per Tube QHS  . free water  240 mL Per Tube Q4H  . heparin  5,000 Units Subcutaneous 3 times per day  . levETIRAcetam  750 mg Per Tube BID  . liver oil-zinc oxide   Topical TID  . meropenem (MERREM) IV  1 g Intravenous 3 times per day  . multivitamin  5 mL Per Tube Daily  . nystatin  1 g Topical TID  . omeprazole  40 mg Per Tube Daily  . polyethylene glycol  17 g Per Tube Daily  . potassium chloride  40 mEq Per Tube Once    Time spent on care of this patient: 40 mins   Allie Bossier , MD   [facility] Office  [phone] Pager [phone]  On-Call/Text Page:      [email or web address]      password [email or web address]  If 7PM-7AM, please contact night-coverage [email or web address] Password [email or web address] 07/02/2013, 9:01 AM   LOS: 3 days

## 2013-07-03 LAB — GLUCOSE, CAPILLARY
GLUCOSE-CAPILLARY: 95 mg/dL (ref 70–99)
GLUCOSE-CAPILLARY: 97 mg/dL (ref 70–99)
Glucose-Capillary: 112 mg/dL — ABNORMAL HIGH (ref 70–99)
Glucose-Capillary: 94 mg/dL (ref 70–99)

## 2013-07-03 MED ORDER — FOSFOMYCIN TROMETHAMINE 3 G PO PACK: 3.0000 g | PACK | ORAL | Status: DC

## 2013-07-03 NOTE — Clinical Social Work Note (Signed)
Per MD patient ready for Dc home. Wife confirmed address in [place]. Ambulance transport requested. Wife aware of DC. CSW signing off at this time.   Liz Beach MSW, [place], [place], [phone]

## 2013-07-03 NOTE — Discharge Summary (Signed)
Physician Discharge Summary  Terry Palmer YQM:[id number] DOB: [birth year] DOA: 06/29/2013  PCP: Terry Poll, MD  Admit date: 06/29/2013 Discharge date: 07/03/2013  Time spent: greater than 30 min  Discharge Diagnoses:  Principal Problem:   Sepsis(995.91) Active Problems:   Asthma   Quadriparesis (muscle weakness)   UTI (lower urinary tract infection), providencii, proteus   Multiple sclerosis   Protein calorie malnutrition   Neurogenic bladder   Hypokalemia Post peg and SP catheter  Discharge Condition: stable  Filed Weights   07/01/13 0317 07/02/13 0500 07/03/13 0500  Weight: 81.9 kg (180 lb 8.9 oz) 81.4 kg (179 lb 7.3 oz) 82.8 kg (182 lb 8.7 oz)    History of present illness:  38 y.o BM PMHx advanced Multiple Sclerosis, Functional Quadriplegia due to severe 4 extremity contractures due to multiple sclerosis and is largely bedbound, Dysphagia, has a PEG tube for feeding, Neurogenic bladder with suprapubic catheter, recurrent UTIs with sepsis in the past, history of decubitus ulcer in the sacral area which is currently healed, chronic stable asthma, history of bladder calculi.  Patient with above history underwent suprapubic catheter change few days ago by one of the home health nurses, apparently this new catheter was not draining, upon evaluation it was found that the catheter was probably inserted into a fistulous opening next to the suprapubic catheter opening site, he started running fevers yesterday at home and had no drainage out of his new catheter site, he was then brought to the ER.  In the ER he was found to have leukocytosis, low-grade fever and hypotension, urology was called to replace suprapubic catheter but they failed as likely the old suprapubic catheter opening had clogged off, IR was requested and a new suprapubic catheter was placed.  Hospital Course:  Sepsis  Urinary source. Admitted to stepdown, started on meropenum. Blood pressure stabilized. Blood  cultures negative. PICC line placed. Removed prior to discharge  Complicated UTI: urine cultures grew MDR Providencia and proteus. Meropenum changed to fosfomycin 6/25 q72h for 2 doses to complete a total course of antibiotics 10 days.  CT pelvis showed no abscess or bladder perforation.  Neurogenic bladder  - New supra-pubic catheter placed by IR   Multiple sclerosis with quadriplegia and 4 extremity severe contractures with aphasia  Nonverbal and bedbound at baseline  Severe protein calorie malnutrition.  Continued on tube feeds  Hypokalemia  corrected  Consultants:  IR Urology  Procedures: SP catheter placement, picc line (removed)  Discharge Exam: Filed Vitals:   07/03/13 0745  BP: 105/69  Pulse: 89  Temp: 98.4 F (36.9 C)  Resp: 15    General: nonverbal Cardiovascular: RRR Respiratory: CTA abd catheter draining clear yellow urine. S, nt, nd Ext contractures. No edema  Discharge Instructions You were cared for by a hospitalist during your hospital stay. If you have any questions about your discharge medications or the care you received while you were in the hospital after you are discharged, you can call the unit and asked to speak with the hospitalist on call if the hospitalist that took care of you is not available. Once you are discharged, your primary care physician will handle any further medical issues. Please note that NO REFILLS for any discharge medications will be authorized once you are discharged, as it is imperative that you return to your primary care physician (or establish a relationship with a primary care physician if you do not have one) for your aftercare needs so that they can reassess your need for  medications and monitor your lab values.  Discharge Instructions   Bed rest    Complete by:  As directed      Discharge instructions    Complete by:  As directed   [place]            Medication List         acetaminophen 160  MG/5ML elixir  Commonly known as:  TYLENOL  Take 40 mg by mouth every 4 (four) hours as needed for fever or pain.     ACIDOPHILUS PO  Place into feeding tube as directed.     ascorbic acid 500 MG/5ML syrup  Commonly known as:  VITAMIN C  Place 5 mLs (500 mg total) into feeding tube 2 (two) times daily.     baclofen 20 MG tablet  Commonly known as:  LIORESAL  Place 1 tablet (20 mg total) into feeding tube 3 (three) times daily.     buPROPion 75 MG tablet  Commonly known as:  WELLBUTRIN  Place 1 tablet (75 mg total) into feeding tube 3 (three) times daily.     cholecalciferol 1000 UNITS tablet  Commonly known as:  VITAMIN D  Take 1 tablet (1,000 Units total) by mouth 2 (two) times daily.     Cranberry Juice Powder 425 MG Caps  1 capsule (425 mg total) by PEG Tube route 2 (two) times daily.     dantrolene 100 MG capsule  Commonly known as:  DANTRIUM  Place 200 mg into feeding tube daily.     DESITIN EX  Apply 1 application topically as needed (may apply to minor skin breakdown until healed).     diazepam 5 MG tablet  Commonly known as:  VALIUM  Place 1 tablet (5 mg total) into feeding tube at bedtime.     DIFLUCAN PO  Place 1 tablet into feeding tube daily.     feeding supplement (OSMOLITE 1.5 CAL) Liqd  Place 1,000 mLs into feeding tube continuous. At 20ml/HR     feeding supplement (PRO-STAT SUGAR FREE 64) Liqd  Place 30 mLs into feeding tube 3 (three) times daily.     fexofenadine 180 MG tablet  Commonly known as:  ALLEGRA  Place 180 mg into feeding tube daily.     fosfomycin 3 G Pack  Commonly known as:  MONUROL  Place 3 g into feeding tube every 3 (three) days. ON 6/28 ONCE     free water Soln  Place 240 mLs into feeding tube every 4 (four) hours.     HYDROcodone-acetaminophen 7.5-325 MG per tablet  Commonly known as:  [name] 1 tablet into feeding tube every 6 (six) hours as needed (pain).     ipratropium-albuterol 0.5-2.5 (3) MG/3ML Soln  Commonly  known as:  DUONEB  Take 3 mLs by nebulization every 4 (four) hours as needed (Shortness of breath).     levETIRAcetam 500 MG tablet  Commonly known as:  KEPPRA  Place 1,000 mg into feeding tube 2 (two) times daily.     levETIRAcetam 100 MG/ML solution  Commonly known as:  KEPPRA  Place 1,000 mg into feeding tube 2 (two) times daily.     multivitamin with minerals Tabs tablet  Place 1 tablet into feeding tube daily.     nystatin 100000 UNIT/GM Powd  Apply 1 g topically 3 (three) times daily.     ODORLESS GARLIC 381 MG Tabs  Generic drug:  Garlic Oil  829 mg by PEG Tube route  daily.     omeprazole 2 mg/mL Susp  Commonly known as:  PRILOSEC  Place 20 mLs (40 mg total) into feeding tube daily.     oxybutynin 5 MG tablet  Commonly known as:  DITROPAN  Place 5 mg into feeding tube 2 (two) times daily.     polyethylene glycol packet  Commonly known as:  MIRALAX / GLYCOLAX  Place 17 g into feeding tube daily.     polyvinyl alcohol 1.4 % ophthalmic solution  Commonly known as:  LIQUIFILM TEARS  Place 1 drop into both eyes 3 (three) times daily.       No Known Allergies     Follow-up Information   Follow up with Terry Poll, MD In 1 week.   Specialty:  Family Medicine   Contact information:   [place] [place] [id number] [phone]        The results of significant diagnostics from this hospitalization (including imaging, microbiology, ancillary and laboratory) are listed below for reference.    Significant Diagnostic Studies: Ct Abdomen Pelvis W Contrast  06/29/2013   CLINICAL DATA:  Advanced MS, quadriplegic, malpositioned suprapubic catheter with no output, urosepsis  EXAM: CT ABDOMEN AND PELVIS WITH CONTRAST  TECHNIQUE: Multidetector CT imaging of the abdomen and pelvis was performed using the standard protocol following bolus administration of intravenous contrast.  CONTRAST:  13mL OMNIPAQUE IOHEXOL 300 MG/ML  SOLN  COMPARISON:   01/20/2013  FINDINGS: Lower chest: Minor bibasilar atelectasis. Normal heart size. No pericardial or pleural effusion.  Abdomen: Small amounts of dependent left upper quadrant perisplenic free fluid, images 16 through 19. This is nonspecific. Liver, gallbladder, biliary system, pancreas, and adrenal glands are within normal limits for age and demonstrate no acute process. Balloon retention gastrostomy noted within the stomach.  Negative for bowel obstruction, dilatation, ileus, or free air. Colon is mildly distended with air. Slightly prominent appendix containing contrast.  Kidneys demonstrate mild pelviectasis. No hydronephrosis. Nonobstructing left upper pole 5 mm renal calculus evident, image 43. Mild bilateral Hydroureter extends into the upper pelvis. Ureteral enhancement noted diffusely. Periureteral stranding evident.  Pelvis: Bladder is collapsed but has a thickened enhancing wall. Suprapubic catheter within the bladder. Appearance is compatible with cystitis/ascending urinary tract infection within the ureters.  Strandy edema and free fluid within the anterior prevesical space superior to the collapsed bladder could be related to a previously malpositioned suprapubic catheter or the ascending urinary tract infection.  No pelvic abscess, acute hematoma, inguinal abnormality, hernia. 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  IMPRESSION: Thickened enhancing bladder wall with bilateral hydroureter and ureteral enhancement compatible with cystitis/ascending urinary tract infection. No obstructing ureteral calculus.  Diffuse strandy edema and fluid within the anterior pre vesicle retroperitoneal space, could be secondary to the acute urinary tract infection versus iatrogenic from an apparent malposition suprapubic catheter.  Nonobstructing left nephrolithiasis  Small amount of left upper quadrant perisplenic free fluid, likely related to the pelvic findings.  Gastrostomy within the  stomach.   Electronically Signed   By: Daryll Brod M.D.   On: 06/29/2013 17:03   Ir Catheter Tube Change  06/29/2013   CLINICAL DATA:  Inadvertent removal of suprapubic catheter  EXAM: SUPRAPUBIC CATHETER REPLACEMENT UNDER FLUOROSCOPIC GUIDANCE  FLUOROSCOPY TIME:  30 seconds  TECHNIQUE: The site of the previous suprapubic catheter was prepped with Betadine, draped in usual sterile fashion. Under fluoroscopic guidance, a 5 French catheter was advanced through the tract into the urinary bladder. Contrast  injection confirmed appropriate positioning. Catheter was exchanged over a guidewire for a 14 French pigtail catheter, formed centrally within the urinary bladder. Contrast injection confirms appropriate positioning and patency. Catheter secured externally with 0 Prolene suture and placed to external drain bag. No immediate complication.  IMPRESSION: 1. Technically successful replacement of suprapubic catheter under fluoroscopy.   Electronically Signed   By: Arne Cleveland M.D.   On: 06/29/2013 17:26   Ir Fluoro Guide Cv Line Left  06/30/2013   CLINICAL DATA:  Multiple sclerosis  EXAM: LEFT UPPER EXTREMITY PICC LINE PLACEMENT WITH ULTRASOUND AND FLUOROSCOPIC GUIDANCE  FLUOROSCOPY TIME:  6 seconds.  PROCEDURE: The patient was advised of the possible risks andcomplications and agreed to undergo the procedure. The patient was then brought to the angiographic suite for the procedure.  The left arm was prepped with chlorhexidine, drapedin the usual sterile fashion using maximum barrier technique (cap and mask, sterile gown, sterile gloves, large sterile sheet, hand hygiene and cutaneous antisepsis) and infiltrated locally with 1% Lidocaine.  Ultrasound demonstrated patency of the left basilic vein, and this was documented with an image. Under real-time ultrasound guidance, this vein was accessed with a 21 gauge micropuncture needle and image documentation was performed. A 0.018 wire was introduced in to the vein.  Over this, a 5 [place] double lumen Power PICC was advanced to the lower SVC/right atrial junction. Fluoroscopy during the procedure and fluoro spot radiograph confirms appropriate catheter position. The catheter was flushed and covered with asterile dressing.  Complications: None  IMPRESSION: Successful left arm Power PICC line placement with ultrasound and fluoroscopic guidance. The catheter is ready for use.   Electronically Signed   By: Maryclare Bean M.D.   On: 06/30/2013 15:41   Ir US Guide Vasc Access Left  06/30/2013   CLINICAL DATA:  Multiple sclerosis  EXAM: LEFT UPPER EXTREMITY PICC LINE PLACEMENT WITH ULTRASOUND AND FLUOROSCOPIC GUIDANCE  FLUOROSCOPY TIME:  6 seconds.  PROCEDURE: The patient was advised of the possible risks andcomplications and agreed to undergo the procedure. The patient was then brought to the angiographic suite for the procedure.  The left arm was prepped with chlorhexidine, drapedin the usual sterile fashion using maximum barrier technique (cap and mask, sterile gown, sterile gloves, large sterile sheet, hand hygiene and cutaneous antisepsis) and infiltrated locally with 1% Lidocaine.  Ultrasound demonstrated patency of the left basilic vein, and this was documented with an image. Under real-time ultrasound guidance, this vein was accessed with a 21 gauge micropuncture needle and image documentation was performed. A 0.018 wire was introduced in to the vein. Over this, a 5 [place] double lumen Power PICC was advanced to the lower SVC/right atrial junction. Fluoroscopy during the procedure and fluoro spot radiograph confirms appropriate catheter position. The catheter was flushed and covered with asterile dressing.  Complications: None  IMPRESSION: Successful left arm Power PICC line placement with ultrasound and fluoroscopic guidance. The catheter is ready for use.   Electronically Signed   By: Maryclare Bean M.D.   On: 06/30/2013 15:41   Dg Chest Portable 1 View  06/29/2013   CLINICAL  DATA:  ALTERED MENTAL STATUS  EXAM: PORTABLE CHEST - 1 VIEW  COMPARISON:  Portable chest 01/20/2013.  FINDINGS: Low lung volumes. The heart size and mediastinal contours are within normal limits. Both lungs are clear. The visualized skeletal structures are unremarkable.  IMPRESSION: No active disease.   Electronically Signed   By: Margaree Mackintosh M.D.   On: 06/29/2013 09:47  Microbiology: Recent Results (from the past 240 hour(s))  URINE CULTURE     Status: None   Collection Time    06/29/13  2:29 PM      Result Value Ref Range Status   Specimen Description URINE, SUPRAPUBIC   Final   Special Requests NONE   Final   Culture  Setup Time     Final   Value: 06/29/2013 14:46     Performed at [place]     Final   Value: >=100,000 COLONIES/ML     Performed at [facility]   Culture     Final   Value: [place]     Performed at [facility]   Report Status 07/02/2013 FINAL   Final   Organism ID, Bacteria PROVIDENCIA STUARTII   Final   Organism ID, Bacteria PROTEUS MIRABILIS   Final  MRSA PCR SCREENING     Status: None   Collection Time    06/29/13  5:15 PM      Result Value Ref Range Status   MRSA by PCR NEGATIVE  NEGATIVE Final   Comment:            The GeneXpert MRSA Assay (FDA     approved for NASAL specimens     only), is one component of a     comprehensive MRSA colonization     surveillance program. It is not     intended to diagnose MRSA     infection nor to guide or     monitor treatment for     MRSA infections.  CULTURE, BLOOD (ROUTINE X 2)     Status: None   Collection Time    06/29/13  7:10 PM      Result Value Ref Range Status   Specimen Description BLOOD LEFT FOOT   Final   Special Requests BOTTLES DRAWN AEROBIC ONLY 5CC   Final   Culture  Setup Time     Final   Value: 06/29/2013 22:26     Performed at [facility]   Culture     Final   Value:        BLOOD CULTURE RECEIVED NO  GROWTH TO DATE CULTURE WILL BE HELD FOR 5 DAYS BEFORE ISSUING A FINAL NEGATIVE REPORT     Performed at [facility]   Report Status PENDING   Incomplete  CULTURE, BLOOD (ROUTINE X 2)     Status: None   Collection Time    06/29/13  7:25 PM      Result Value Ref Range Status   Specimen Description BLOOD RIGHT FOOT   Final   Special Requests BOTTLES DRAWN AEROBIC ONLY 5CC   Final   Culture  Setup Time     Final   Value: 06/29/2013 22:26     Performed at [facility]   Culture     Final   Value:        BLOOD CULTURE RECEIVED NO GROWTH TO DATE CULTURE WILL BE HELD FOR 5 DAYS BEFORE ISSUING A FINAL NEGATIVE REPORT     Performed at [facility]   Report Status PENDING   Incomplete     Labs: Basic Metabolic Panel:  Recent Labs Lab 06/29/13 0906 06/30/13 0220 07/02/13 0519  NA 139 140 144  K 4.3 4.0 3.4*  CL 97 104 106  CO2 23 23 25   GLUCOSE 113* 81 142*  BUN 33*  18 14  CREATININE 1.00 0.68 0.54  CALCIUM 10.1 8.6 8.8   Liver Function Tests:  Recent Labs Lab 07/02/13 0519  AST 11  ALT 10  ALKPHOS 109  BILITOT <0.2*  PROT 6.9  ALBUMIN 3.1*   No results found for this basename: LIPASE, AMYLASE,  in the last 168 hours No results found for this basename: AMMONIA,  in the last 168 hours CBC:  Recent Labs Lab 06/29/13 0906 06/30/13 0220 07/02/13 0120  WBC 17.7* 11.9* 8.2  NEUTROABS 14.5*  --   --   HGB 15.8 12.1* 11.5*  HCT 46.8 37.1* 35.2*  MCV 83.9 84.7 84.8  PLT 334 254 242   Cardiac Enzymes: No results found for this basename: CKTOTAL, CKMB, CKMBINDEX, TROPONINI,  in the last 168 hours BNP: BNP (last 3 results) No results found for this basename: PROBNP,  in the last 8760 hours CBG:  Recent Labs Lab 07/02/13 1441 07/02/13 2005 07/03/13 0021 07/03/13 0324 07/03/13 0819  GLUCAP 114* 112* 97 95 94   Signed:  SULLIVAN,CORINNA L  [facility] 07/03/2013, 12:00 PM

## 2013-07-03 NOTE — Progress Notes (Signed)
Order per MD to remove PICC line. Removed Left single lumen PICC measuring 38.5 cm./documented per RAD.as right sided PICC. Patient tolerated well. Pressure dressing applied. No bleeding note @ site. S.Nikyah Lackman [facility]

## 2013-07-03 NOTE — Progress Notes (Signed)
Spoke with pt's wife, Terry Palmer, regarding pt's discharge scheduled for today.  Update her of pt's status, antibiotics ordered, PICC removed and discussed a larger catheter bag for suprapubic catheter.  Terry Palmer states she will be home and available today - advised that RN will contact her once transfer time has been confirmed.  Pt currently resting with eyes closed.  Will continue to monitor.

## 2013-07-03 NOTE — Progress Notes (Signed)
IV team contacted and notified of order to have PICC removed per MD.    RN assisted by holding pt's arm straight while LUE DL PICC was removed.  Pt opens eyes to voice - tv on for entertainment.  Will continue to monitor.

## 2013-07-03 NOTE — Progress Notes (Addendum)
Ambulance transport service has arrived to escort pt home.  Pt's wife and home health service company notified of time of departure.  CCMD notified of pt's discharge.  No acute issues prior to discharge.

## 2013-07-05 LAB — CULTURE, BLOOD (ROUTINE X 2)
CULTURE: NO GROWTH
Culture: NO GROWTH

## 2013-07-06 LAB — GLUCOSE, CAPILLARY: Glucose-Capillary: 115 mg/dL — ABNORMAL HIGH (ref 70–99)

## 2013-07-07 ENCOUNTER — Encounter (HOSPITAL_BASED_OUTPATIENT_CLINIC_OR_DEPARTMENT_OTHER): Payer: Medicare HMO | Attending: General Surgery

## 2013-07-08 ENCOUNTER — Encounter (HOSPITAL_COMMUNITY): Payer: Self-pay | Admitting: Emergency Medicine

## 2013-07-08 ENCOUNTER — Emergency Department (HOSPITAL_COMMUNITY): Payer: Medicare HMO

## 2013-07-08 ENCOUNTER — Emergency Department (HOSPITAL_COMMUNITY)
Admission: EM | Admit: 2013-07-08 | Discharge: 2013-07-08 | Disposition: A | Discharge status: Home / Self Care | Payer: Medicare HMO | Attending: Emergency Medicine | Admitting: Emergency Medicine

## 2013-07-08 DIAGNOSIS — J45909 Unspecified asthma, uncomplicated: Secondary | ICD-10-CM | POA: Diagnosis not present

## 2013-07-08 DIAGNOSIS — T8389XA Other specified complication of genitourinary prosthetic devices, implants and grafts, initial encounter: Secondary | ICD-10-CM | POA: Insufficient documentation

## 2013-07-08 DIAGNOSIS — Z8669 Personal history of other diseases of the nervous system and sense organs: Secondary | ICD-10-CM | POA: Diagnosis not present

## 2013-07-08 DIAGNOSIS — Z87891 Personal history of nicotine dependence: Secondary | ICD-10-CM | POA: Diagnosis not present

## 2013-07-08 DIAGNOSIS — Z8701 Personal history of pneumonia (recurrent): Secondary | ICD-10-CM | POA: Diagnosis not present

## 2013-07-08 DIAGNOSIS — Z862 Personal history of diseases of the blood and blood-forming organs and certain disorders involving the immune mechanism: Secondary | ICD-10-CM | POA: Insufficient documentation

## 2013-07-08 DIAGNOSIS — Z87440 Personal history of urinary (tract) infections: Secondary | ICD-10-CM | POA: Diagnosis not present

## 2013-07-08 DIAGNOSIS — Z87442 Personal history of urinary calculi: Secondary | ICD-10-CM | POA: Insufficient documentation

## 2013-07-08 DIAGNOSIS — Y846 Urinary catheterization as the cause of abnormal reaction of the patient, or of later complication, without mention of misadventure at the time of the procedure: Secondary | ICD-10-CM | POA: Diagnosis not present

## 2013-07-08 DIAGNOSIS — Z79899 Other long term (current) drug therapy: Secondary | ICD-10-CM | POA: Insufficient documentation

## 2013-07-08 DIAGNOSIS — F329 Major depressive disorder, single episode, unspecified: Secondary | ICD-10-CM | POA: Insufficient documentation

## 2013-07-08 DIAGNOSIS — F3289 Other specified depressive episodes: Secondary | ICD-10-CM | POA: Insufficient documentation

## 2013-07-08 DIAGNOSIS — T83010A Breakdown (mechanical) of cystostomy catheter, initial encounter: Secondary | ICD-10-CM

## 2013-07-08 MED ORDER — IOHEXOL 300 MG/ML  SOLN
25.0000 mL | Freq: Once | INTRAMUSCULAR | Status: AC | PRN
  Administered 2013-07-08: 20 mL via INTRAVENOUS

## 2013-07-08 NOTE — ED Notes (Signed)
Pt back from IR 

## 2013-07-08 NOTE — ED Notes (Addendum)
Per ems pt is from home, pt has suprapubic catheter, actually have 2 insertion sites.  pt has old catheter insertion site, thought tunneling occurred, 1 week ago new catheter was inserted into new site. Today suprapubic catheter became dislodged. Home health cannot get supbrapubic catheter into either site. Home health denies fever, abnormal vitals, or agitation.   Pt is at normal baseline. Pt does not talk.

## 2013-07-08 NOTE — ED Notes (Signed)
Bed: QK20 Expected date:  Expected time:  Means of arrival:  Comments: ems- suprapubic catheter dislodged

## 2013-07-08 NOTE — ED Notes (Signed)
Bed: QG92 Expected date:  Expected time:  Means of arrival:  Comments: Terry Palmer, Terry Palmer   In IR

## 2013-07-08 NOTE — ED Provider Notes (Signed)
CSN: [id number]     Arrival date & time 07/08/13  49 History   First MD Initiated Contact with Patient 07/08/13 1505     Chief Complaint  Patient presents with  . suprapubic catheter dislodged      (Consider location/radiation/quality/duration/timing/severity/associated sxs/prior Treatment) The history is provided by a relative and a caregiver.   patient here after his home health aid was unable to change his suprapubic catheter. Patient had a 59 French catheter placed 9 days ago and the aid was instructed to change remove it today. She attempted to insert a 16 French catheter but was unsuccessful. The catheter has been dislodged for approximately 4 hours. No other changes noted to the patient. No bleeding noted from the site. No evidence of infection. Called her physician and told to come here  Past Medical History  Diagnosis Date  . MS (multiple sclerosis)   . 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 Childhood asthma   . Depression   . Neuromuscular disorder     Quadraperesis  . Recurrent UTI   . Dysphagia   . Bladder calculi   . Neurogenic bladder   . Shortness of breath   . Recurrent upper respiratory infection (URI)   . Normocytic anemia 05/28/2011  . Aspiration pneumonia 01/20/2013   Past Surgical History  Procedure Laterality Date  . Lumbar puncture  10/12/2002  . Gastrostomy  04/16/2011    Procedure: GASTROSTOMY;  Surgeon: Zenovia Jarred, MD;  Location: [facility] OR;  Service: General;  Laterality: N/A;  Open G-Tube placement   Family History  Problem Relation Age of Onset  . Asthma Mother    History  Substance Use Topics  . Smoking status: Former Smoker    Types: Cigarettes    Quit date: 02/02/1999  . Smokeless tobacco: Former Systems developer  . Alcohol Use: No    Review of Systems  All other systems reviewed and are negative.     Allergies  Review of patient's allergies indicates no known allergies.  Home Medications   Prior to Admission medications   Medication  Sig Start Date End Date Taking? Authorizing Provider  Amino Acids-Protein Hydrolys (FEEDING SUPPLEMENT, PRO-STAT SUGAR FREE 64,) LIQD Place 30 mLs into feeding tube 3 (three) times daily. 01/24/13  Yes Bonnielee Haff, MD  ascorbic acid (VITAMIN C) 500 MG/5ML syrup Place 5 mLs (500 mg total) into feeding tube 2 (two) times daily. 01/24/13  Yes Bonnielee Haff, MD  baclofen (LIORESAL) 20 MG tablet Place 1 tablet (20 mg total) into feeding tube 3 (three) times daily. 01/24/13  Yes Bonnielee Haff, MD  buPROPion (WELLBUTRIN) 75 MG tablet Place 1 tablet (75 mg total) into feeding tube 3 (three) times daily. 01/24/13  Yes Bonnielee Haff, MD  cholecalciferol (VITAMIN D) 1000 UNITS tablet Take 1 tablet (1,000 Units total) by mouth 2 (two) times daily. 01/24/13  Yes Bonnielee Haff, MD  Cranberry Juice Powder 425 MG CAPS 1 capsule (425 mg total) by PEG Tube route 2 (two) times daily. 01/24/13  Yes Bonnielee Haff, MD  dantrolene (DANTRIUM) 100 MG capsule Place 200 mg into feeding tube daily.   Yes Historical Provider, MD  fexofenadine (ALLEGRA) 180 MG tablet Place 180 mg into feeding tube daily.   Yes Historical Provider, MD  fosfomycin (MONUROL) 3 G PACK Place 3 g into feeding tube every 3 (three) days. ON 6/28 ONCE 07/03/13  Yes Delfina Redwood, MD  Garlic Oil (ODORLESS GARLIC) 759 MG TABS 163 mg by PEG Tube route daily.  Yes Historical Provider, MD  ipratropium-albuterol (DUONEB) 0.5-2.5 (3) MG/3ML SOLN Take 3 mLs by nebulization every 4 (four) hours as needed (Shortness of breath).  02/12/13  Yes Historical Provider, MD  Lactobacillus (ACIDOPHILUS PO) Place 1 tablet into feeding tube as directed.    Yes Historical Provider, MD  levETIRAcetam (KEPPRA) 100 MG/ML solution Place 1,000 mg into feeding tube 2 (two) times daily. 01/24/13  Yes Bonnielee Haff, MD  Multiple Vitamin (MULTIVITAMIN WITH MINERALS) TABS tablet Place 1 tablet into feeding tube daily. 01/24/13  Yes Bonnielee Haff, MD  Nutritional Supplements  (FEEDING SUPPLEMENT, OSMOLITE 1.5 CAL,) LIQD Place 1,000 mLs into feeding tube continuous. At 38ml/HR 01/24/13  Yes Bonnielee Haff, MD  nystatin (MYCOSTATIN/NYSTOP) 100000 UNIT/GM POWD Apply 1 g topically 3 (three) times daily. 01/24/13  Yes Bonnielee Haff, MD  omeprazole (PRILOSEC) 2 mg/mL SUSP Place 20 mLs (40 mg total) into feeding tube daily. 01/24/13  Yes Bonnielee Haff, MD  oxybutynin (DITROPAN) 5 MG tablet Place 5 mg into feeding tube 2 (two) times daily.   Yes Historical Provider, MD  polyvinyl alcohol (LIQUIFILM TEARS) 1.4 % ophthalmic solution Place 1 drop into both eyes 3 (three) times daily. 01/24/13  Yes Bonnielee Haff, MD  senna (SENOKOT) 8.6 MG TABS tablet Take 2 tablets by mouth daily.   Yes Historical Provider, MD  Water For Irrigation, Sterile (FREE WATER) SOLN Place 240 mLs into feeding tube every 4 (four) hours. 01/24/13  Yes Bonnielee Haff, MD  acetaminophen (TYLENOL) 160 MG/5ML elixir Take 40 mg by mouth every 4 (four) hours as needed for fever or pain.    Historical Provider, MD  Diaper Rash Products (DESITIN EX) Apply 1 application topically as needed (may apply to minor skin breakdown until healed).    Historical Provider, MD  HYDROcodone-acetaminophen (NORCO) 7.5-325 MG per tablet Place 1 tablet into feeding tube every 6 (six) hours as needed (pain).    Historical Provider, MD  polyethylene glycol (MIRALAX / GLYCOLAX) packet Place 17 g into feeding tube daily. 01/24/13   Bonnielee Haff, MD   BP 109/81  Pulse 84  Temp(Src) 98.1 F (36.7 C) (Oral)  Resp 19  SpO2 98% Physical Exam  Nursing note and vitals reviewed. Constitutional: He appears well-developed and well-nourished.  Non-toxic appearance. No distress.  HENT:  Head: Normocephalic and atraumatic.  Eyes: Conjunctivae, EOM and lids are normal. Pupils are equal, round, and reactive to light.  Neck: Normal range of motion. Neck supple. No tracheal deviation present. No mass present.  Cardiovascular: Normal rate, regular  rhythm and normal heart sounds.  Exam reveals no gallop.   No murmur heard. Pulmonary/Chest: Effort normal and breath sounds normal. No stridor. No respiratory distress. He has no decreased breath sounds. He has no wheezes. He has no rhonchi. He has no rales.  Abdominal: Soft. Normal appearance and bowel sounds are normal. He exhibits no distension. There is no tenderness. There is no rebound and no CVA tenderness.    Musculoskeletal: Normal range of motion. He exhibits no edema and no tenderness.  Neurological: He is alert. No cranial nerve deficit. GCS eye subscore is 4. GCS verbal subscore is 3. GCS motor subscore is 4.  Neuro exam baseline according to family  Skin: Skin is warm and dry. No abrasion and no rash noted.    ED Course  Procedures (including critical care time) Labs Review Labs Reviewed - No data to display  Imaging Review No results found.   EKG Interpretation None      MDM  Final diagnoses:  None    Catheter placed by IR and pt stable for d/c    [name], MD 07/08/13 1819

## 2013-07-08 NOTE — ED Notes (Signed)
Pt taken to IR

## 2013-07-08 NOTE — Procedures (Signed)
The suprapubic tube was dislodged.  Placement of a new 62 French catheter with fluoroscopy.   No immediate complication.  Drained approximately 450 ml of urine.

## 2013-07-08 NOTE — Discharge Instructions (Signed)
Follow up with your urologist--do not remove the catheter until instructed by your urologist

## 2013-09-09 ENCOUNTER — Telehealth: Payer: Self-pay | Admitting: Neurology

## 2013-09-09 ENCOUNTER — Ambulatory Visit: Payer: Self-pay | Admitting: Neurology

## 2013-09-09 NOTE — Telephone Encounter (Signed)
This patient did not show for a revisit appointment today. 

## 2013-11-13 IMAGING — CR DG CHEST 1V PORT
1 series · 1 of 1 positions shown · non-contrast
Comparison: Chest x-ray 03/13/2011.

CLINICAL DATA: Aspiration.

PORTABLE CHEST - 1 VIEW

[view not recorded]
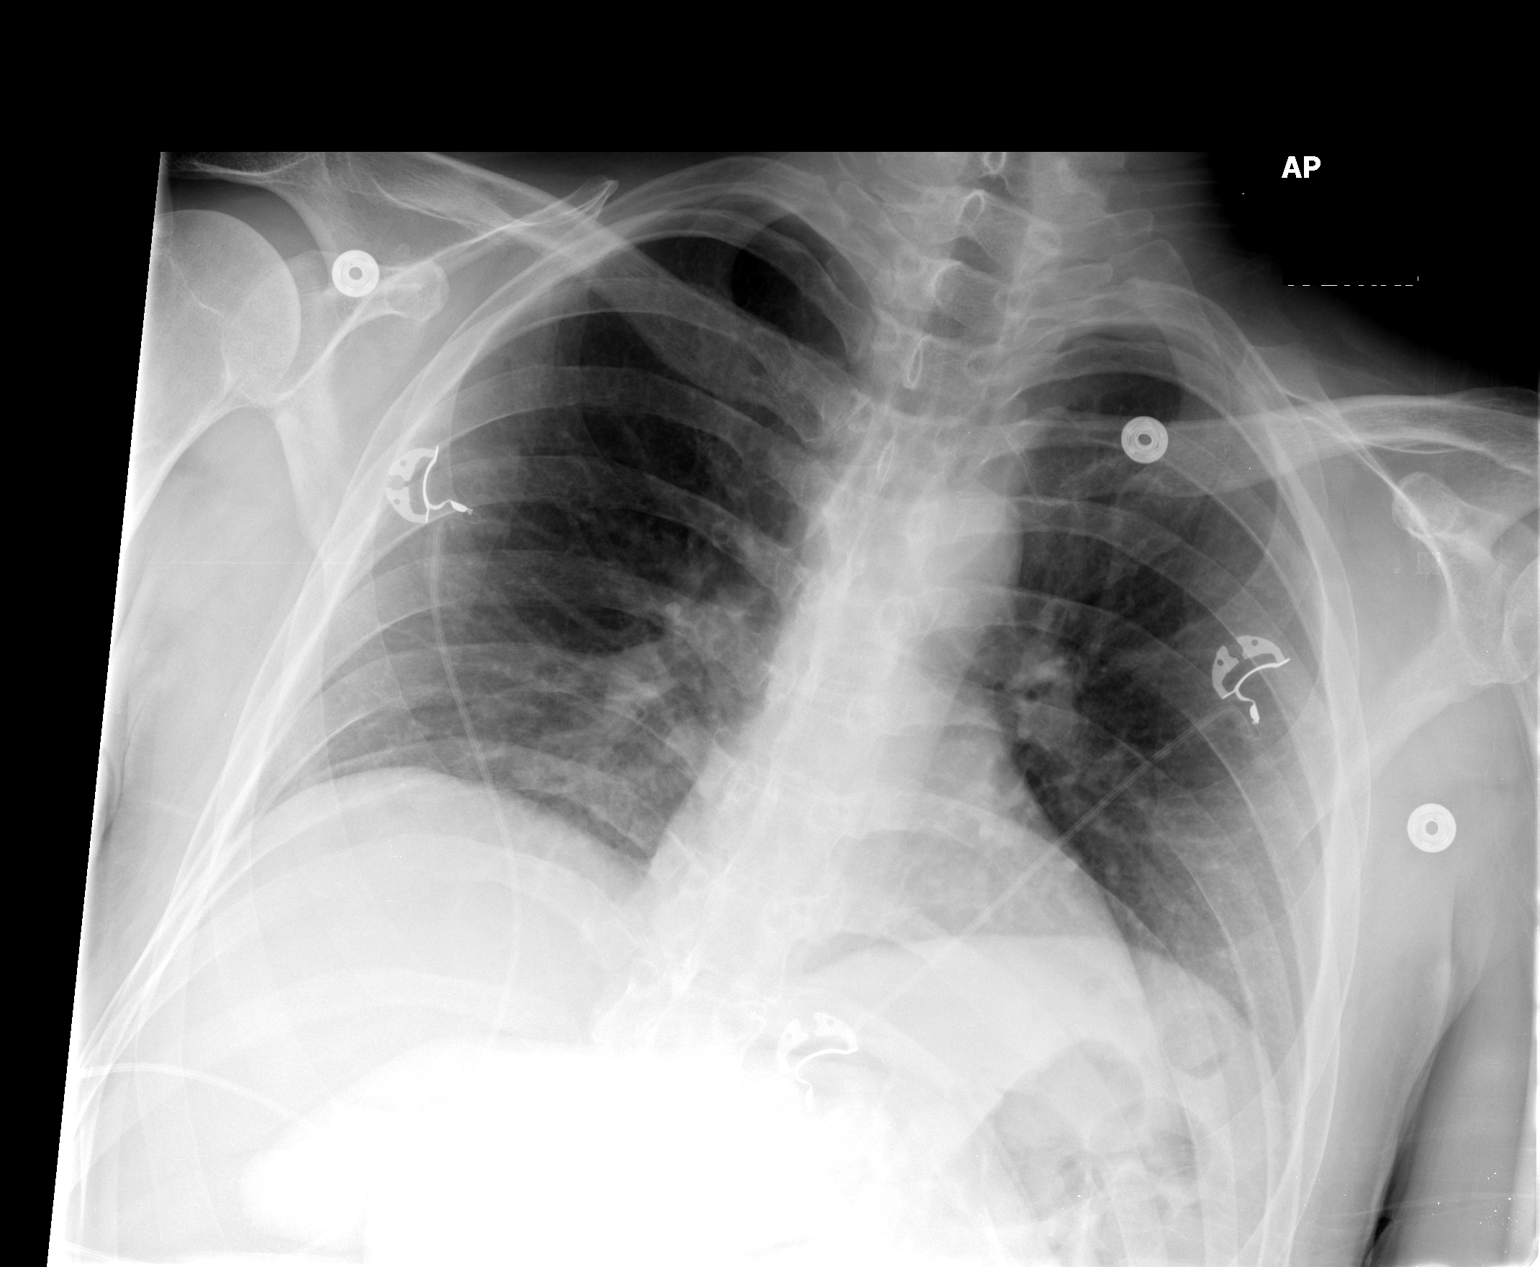

[1 of 1 positions shown; findings below may reference images not displayed]

FINDINGS: Lung volumes are low.  No consolidative airspace disease.
No pleural effusions.  Pulmonary vasculature and the
cardiomediastinal silhouette are within normal limits.
IMPRESSION: 1.  No radiographic evidence of acute cardiopulmonary disease.  Low
lung volumes.

## 2013-11-17 IMAGING — XA IR FLUORO RM 0-60 MIN
1 series · 4 of 4 positions shown · non-contrast
Comparison: CT of the abdomen dated 03/12/2011

CLINICAL DATA: Multiple sclerosis and dysphagia.  The patient has
been referred for gastrostomy tube placement.

IR FLOURO RM 0-60 MIN

[Series 1: run · 4 of 4 slices shown]
[im 1/4]
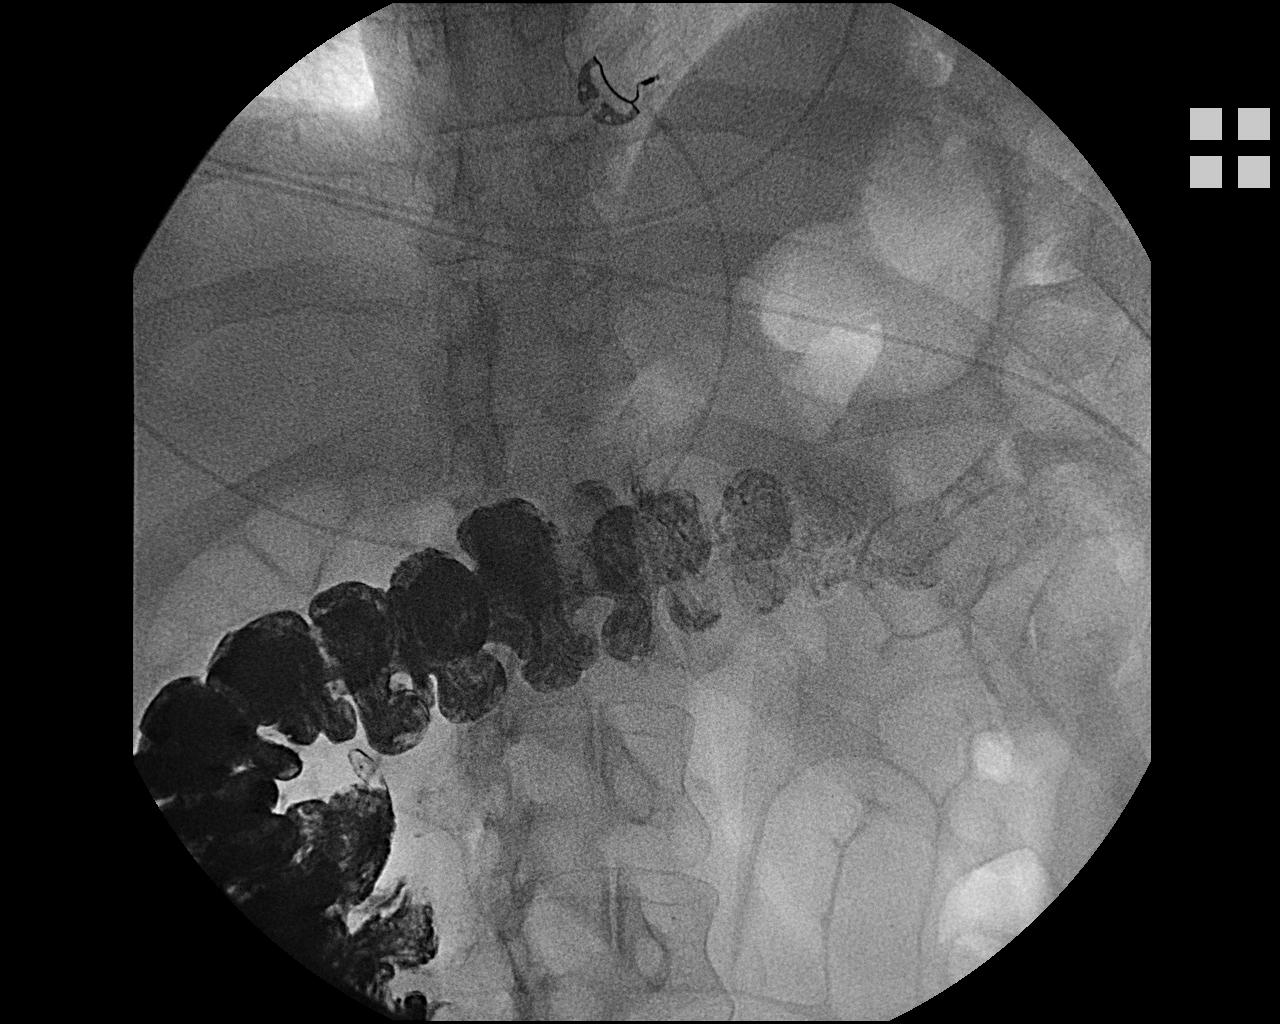
[im 2/4]
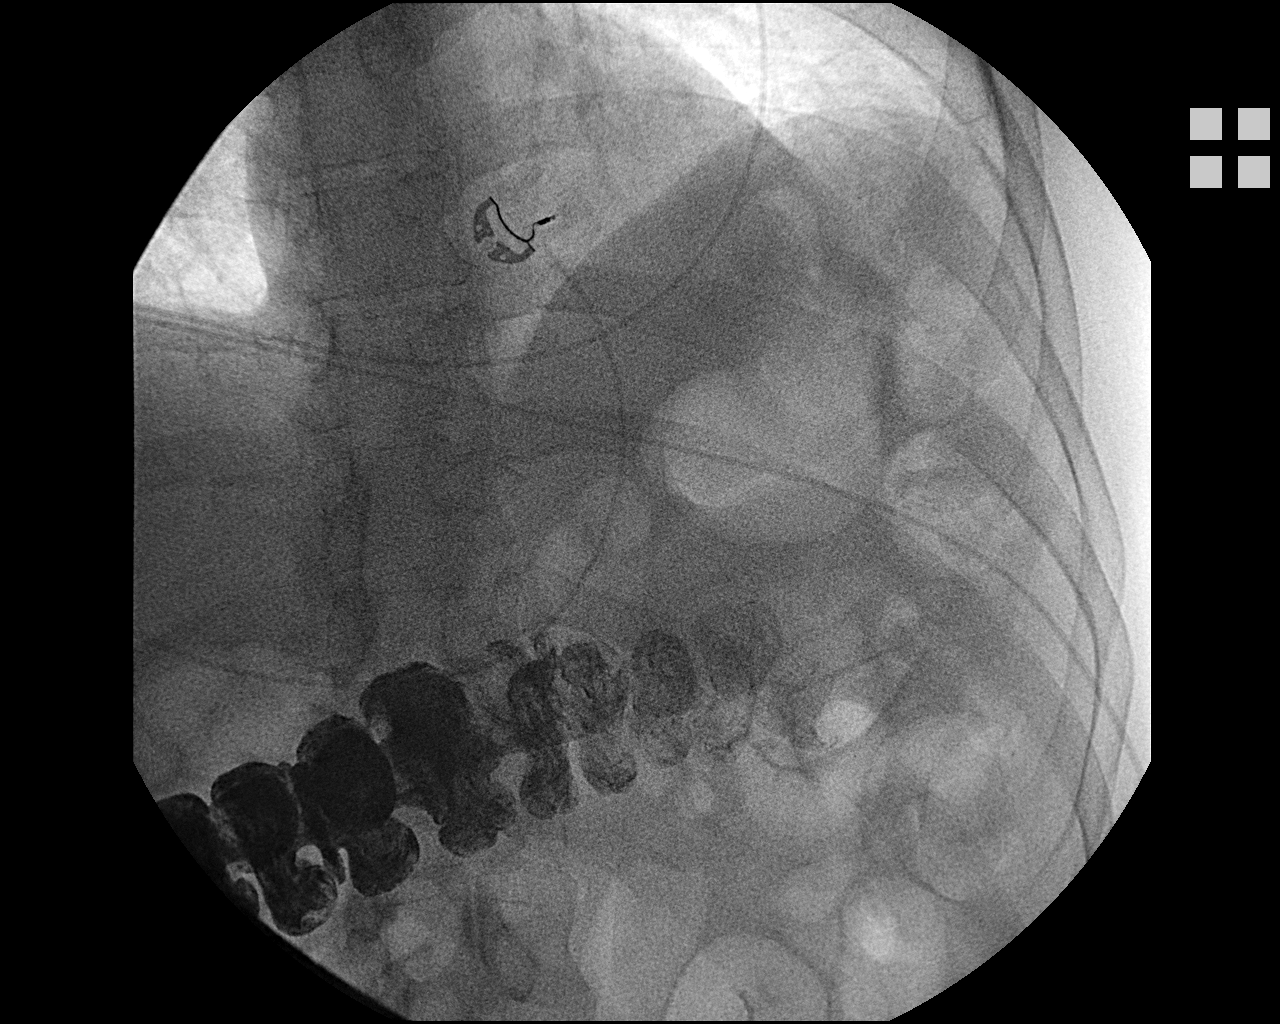
[im 3/4]
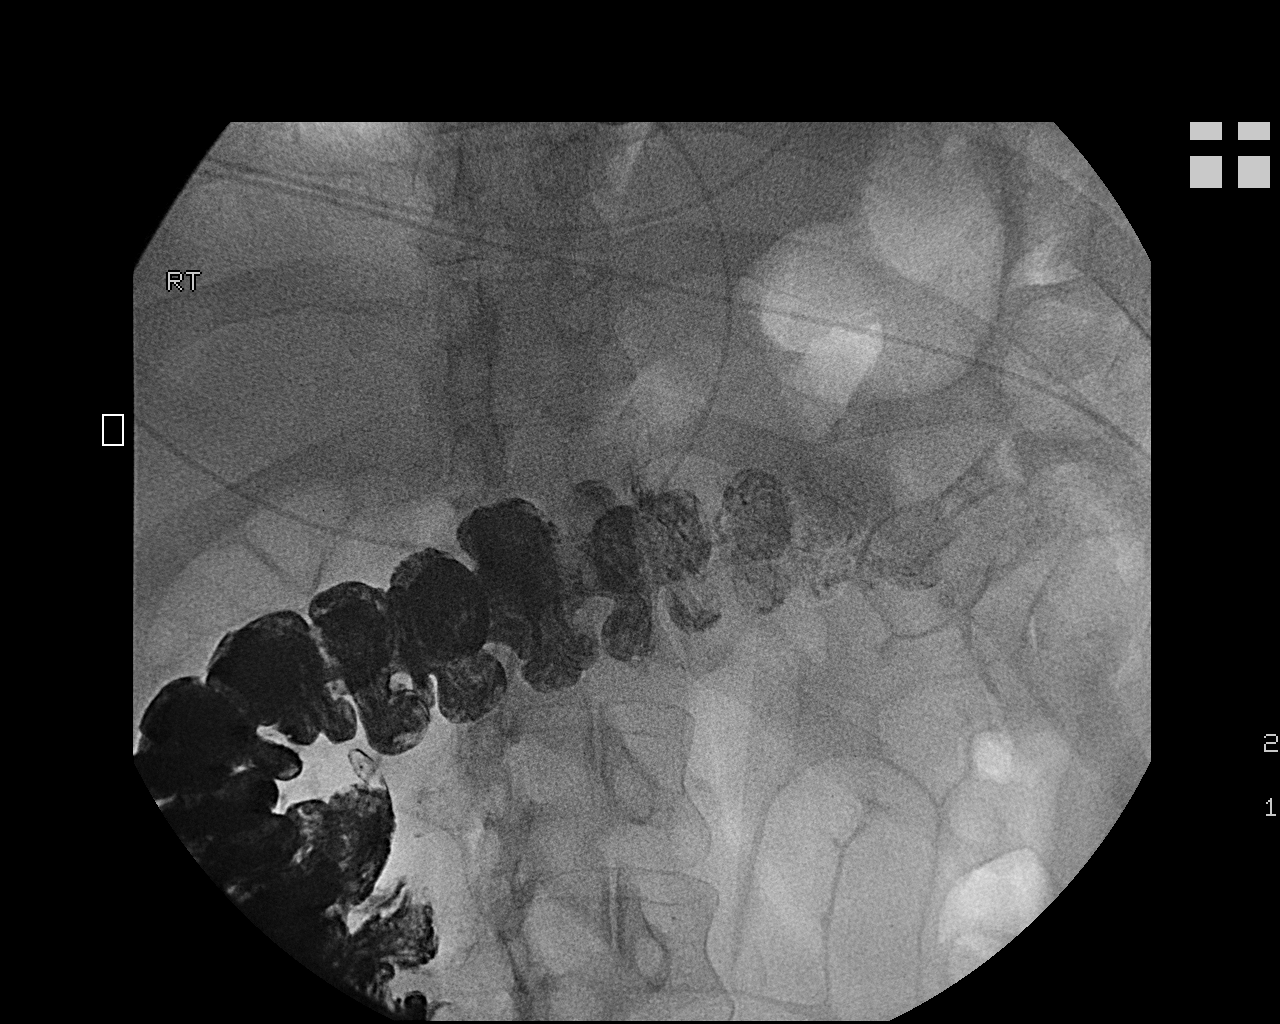
[im 4/4]
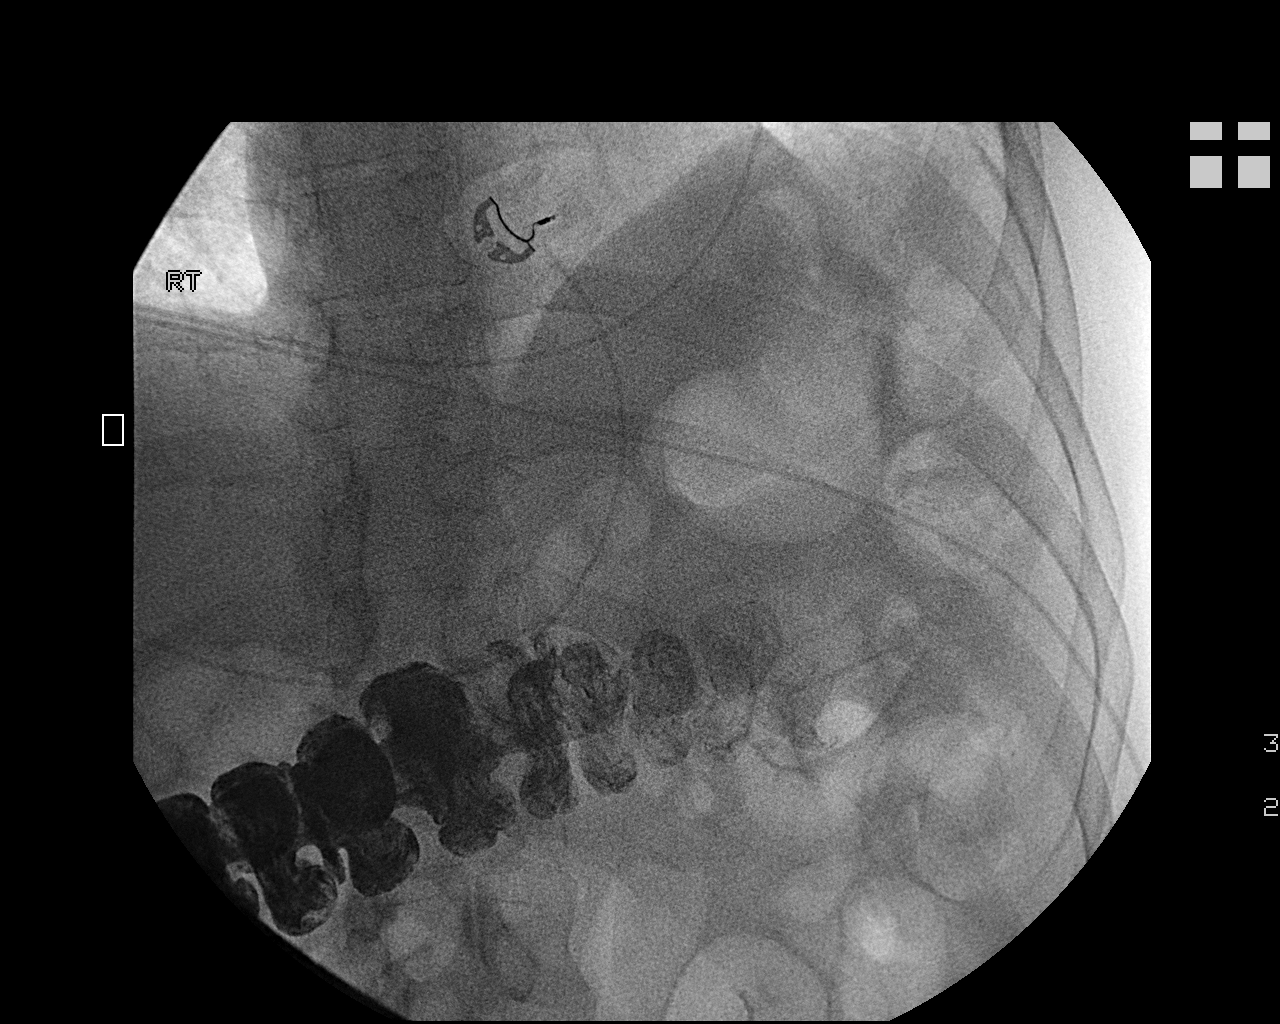

[4 of 4 positions shown; findings below may reference images not displayed]

The patient was brought down for planned gastrostomy tube placement
and had received some barium by mouth yesterday.  Initial
fluoroscopy was performed and images saved.  There was attempt made
to place a 5-French catheter through the mouth and into the stomach
under fluoroscopy.  This attempt was aborted before successful
advancement.
FINDINGS: Initial fluoroscopy shows barium in the proximal colon
and up to approximately the mid transverse colon level.  The
stomach is located in a high position with adjacent high splenic
flexure noted.  In addition, multiple loops of air-filled small
bowel are present throughout the abdomen.  Attempt was made to
place a 5-French orogastric catheter to try to insufflate the
stomach under fluoroscopy to determine whether a gastrostomy tube
could be placed safely.  The patient tolerated the attempt very
poorly due to gag reflex and increased secretions.  It was clear
that the patient would not tolerate further attempt at orogastric
catheter advancement without significant administration of
conscious sedation.

Given radiographic findings, it was felt not safe to proceed today
with attempted gastrostomy tube placement.  An abdominal film will
be obtained tomorrow morning to reassess advancement of barium in
the colon and whether to attempt orogastric catheter placement
after administration of IV conscious sedation to try to insufflate
the stomach safely for gastrostomy tube placement.
IMPRESSION: Gastrostomy tube placement aborted today due to findings above.  An
abdominal film will be obtained in the morning to reassess bowel
gas pattern and transit of barium.

## 2013-11-18 IMAGING — XA IR FLUORO RM 0-60 MIN
1 series · 4 of 4 positions shown · non-contrast
Comparison: 03/12/2011 CT abdomen

CLINICAL DATA: Advanced multiple sclerosis, quadriparesis,
malnutrition

IR FLOURO RM 0-60 MIN

[Series 1: run · 4 of 4 slices shown]
[im 1/4]
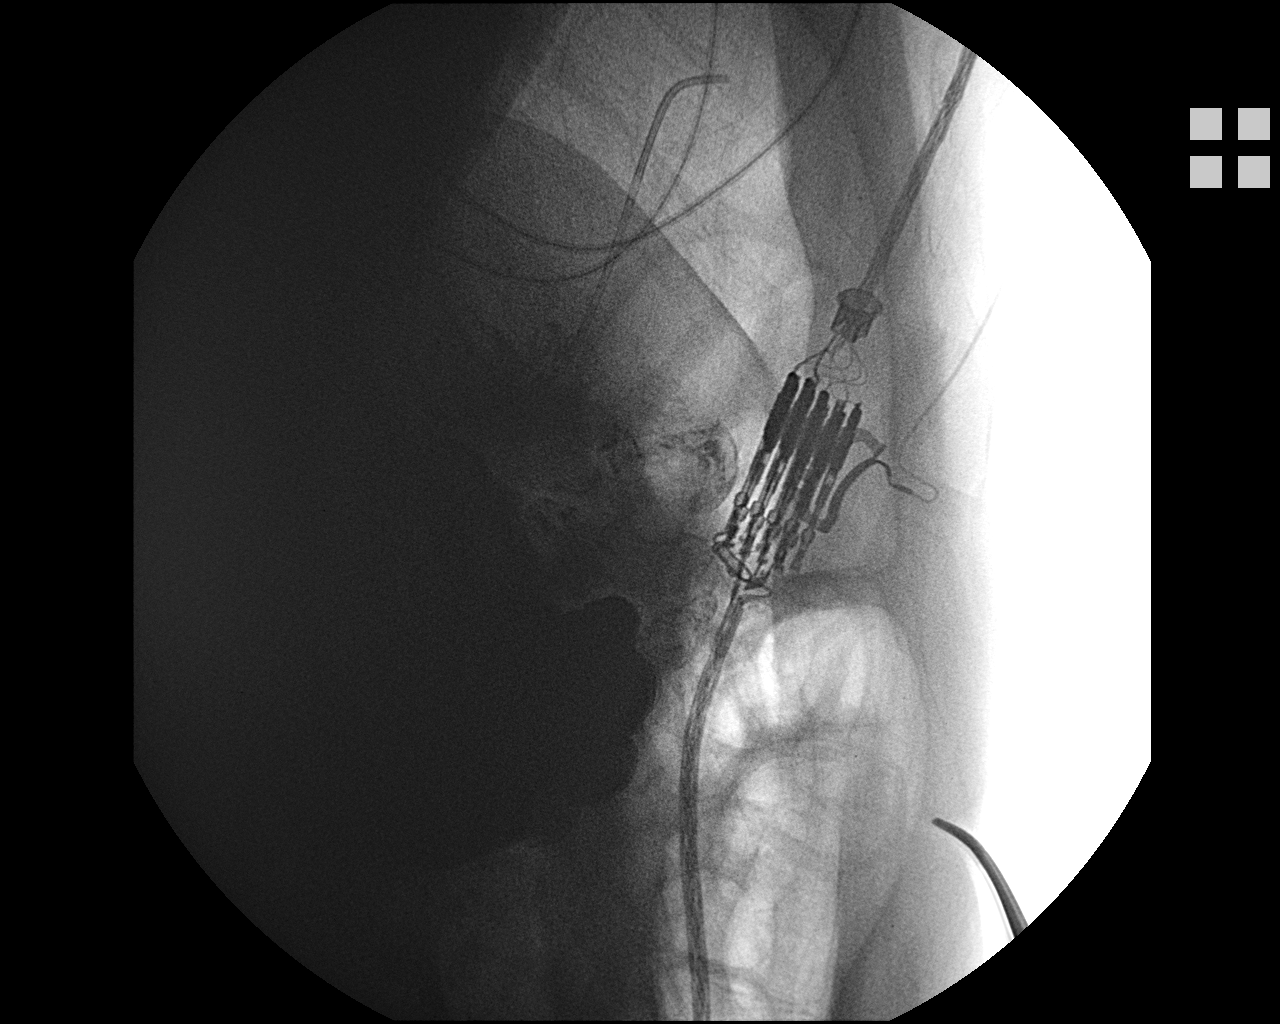
[im 2/4]
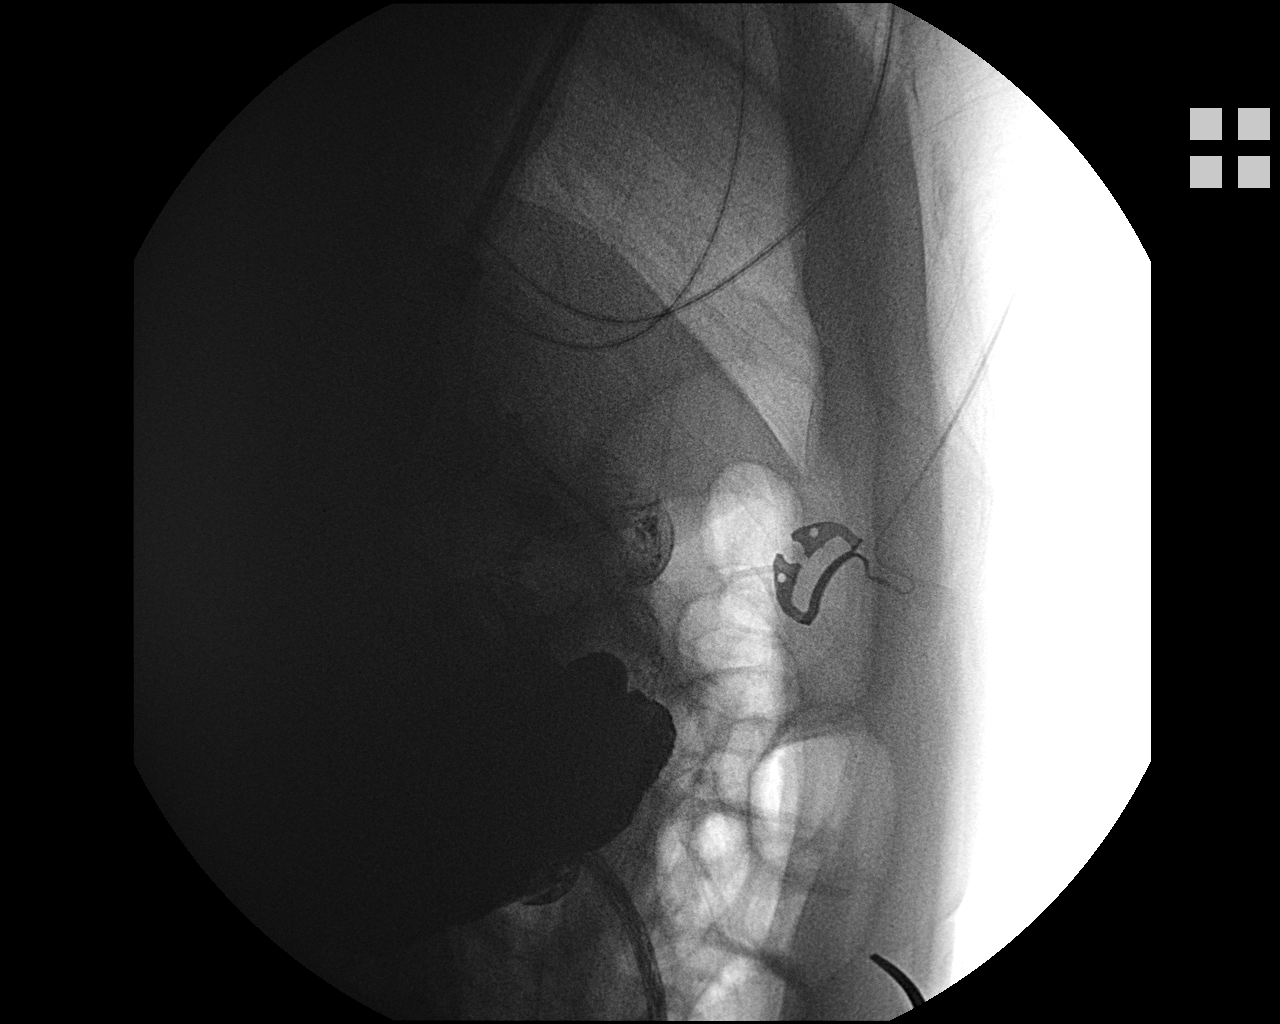
[im 3/4]
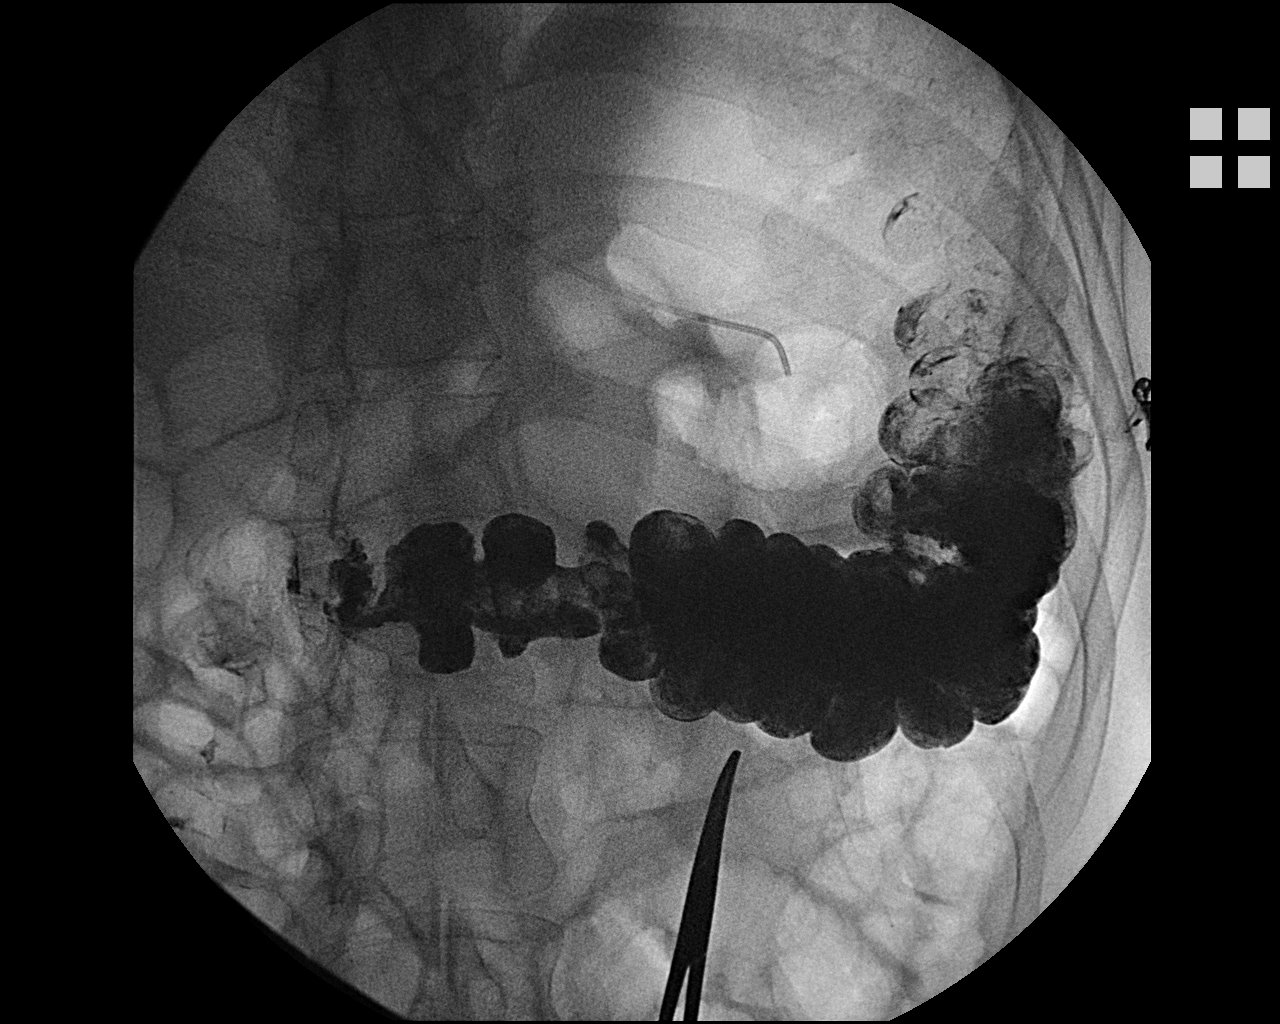
[im 4/4]
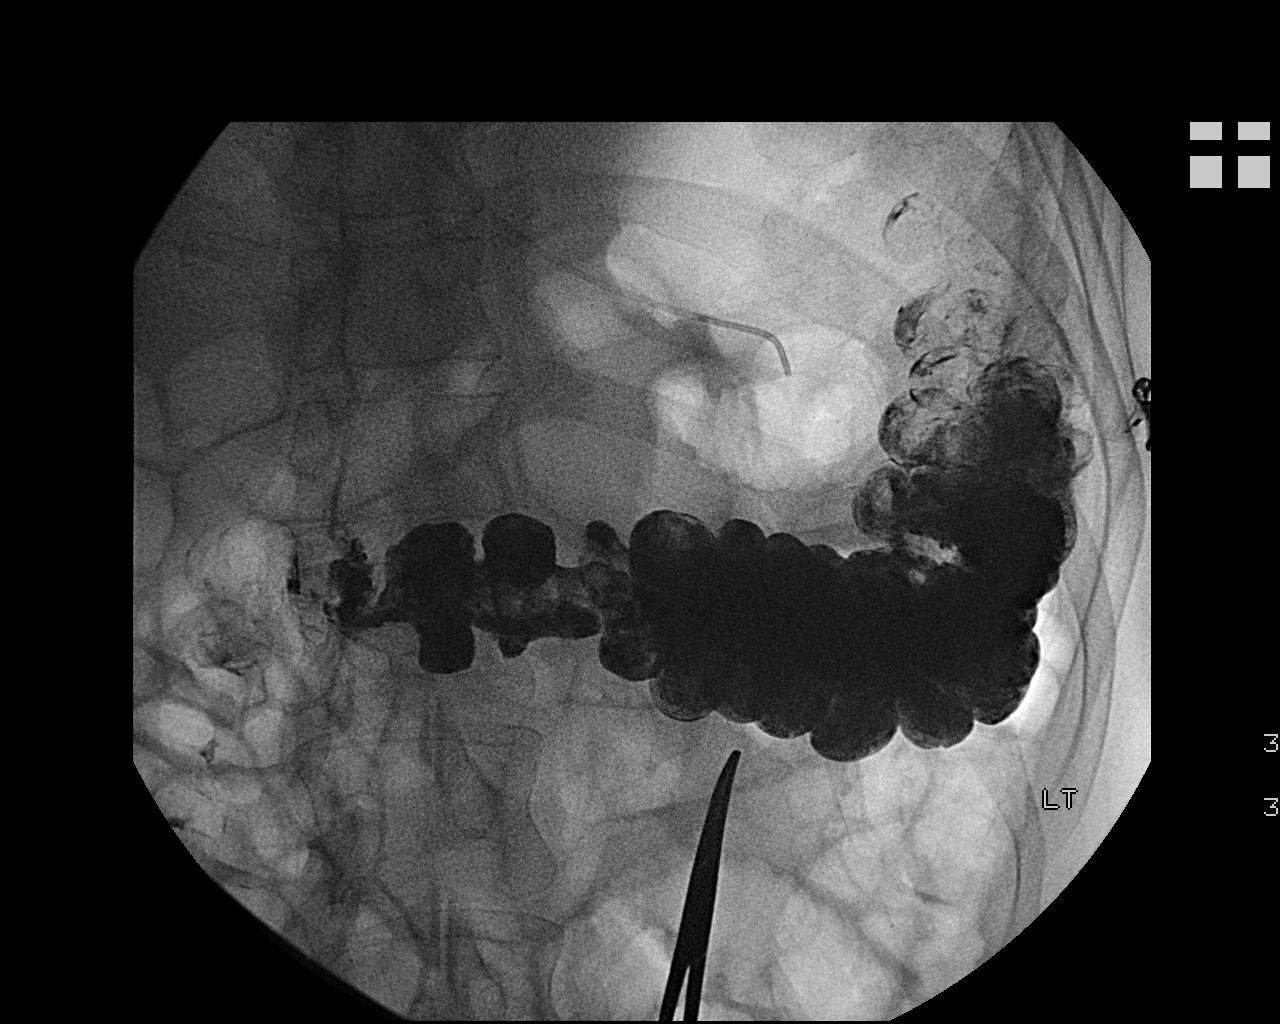

[4 of 4 positions shown; findings below may reference images not displayed]

FINDINGS: In preparation for percutaneous fluoroscopic gastrostomy,
the patient was administered barium the night before.  Under
fluoroscopy the barium is within the transverse colon.  Next, a 5-
French oral gastric catheter was advanced under fluoroscopy into
the stomach for insufflation with air.  The stomach was distended
with air.  Fluoroscopy exam was performed of the abdomen.  The air
distended stomach is high in the left upper quadrant beneath the
left subcostal margin.  Even with adequate stomach distention, the
stomach is well beneath the left subcostal margin in both the
frontal and lateral projections.  Therefore percutaneous needle
access of the stomach cannot be performed safely with fluoroscopy
without risk of bowel injury or placing the tube too close to the
rib margin.

Therefore the gastrostomy tube placement was not performed.

Findings discussed with Dr. Canty.

Recommend surgical consultation for gastrostomy placement
IMPRESSION: Unable to place percutaneous gastrostomy safely under fluoroscopy
because of a high positioned stomach in the left upper quadrant
beneath the subcostal margin as described

## 2014-01-25 ENCOUNTER — Encounter (HOSPITAL_BASED_OUTPATIENT_CLINIC_OR_DEPARTMENT_OTHER): Payer: Commercial Managed Care - HMO | Attending: Plastic Surgery

## 2014-01-25 ENCOUNTER — Encounter: Payer: Self-pay | Admitting: Plastic Surgery

## 2014-01-25 DIAGNOSIS — G8250 Quadriplegia, unspecified: Secondary | ICD-10-CM | POA: Diagnosis not present

## 2014-01-25 DIAGNOSIS — Z931 Gastrostomy status: Secondary | ICD-10-CM | POA: Insufficient documentation

## 2014-01-25 DIAGNOSIS — G35 Multiple sclerosis: Secondary | ICD-10-CM | POA: Insufficient documentation

## 2014-01-25 DIAGNOSIS — L89890 Pressure ulcer of other site, unstageable: Secondary | ICD-10-CM | POA: Diagnosis present

## 2014-01-25 DIAGNOSIS — L89610 Pressure ulcer of right heel, unstageable: Secondary | ICD-10-CM | POA: Diagnosis present

## 2014-01-25 DIAGNOSIS — Z7401 Bed confinement status: Secondary | ICD-10-CM | POA: Insufficient documentation

## 2014-01-26 NOTE — Consult Note (Signed)
Terry Palmer, Terry Palmer NO.:  [id number]  MEDICAL RECORD NO.:  [id number]  LOCATION:  FOOT                         FACILITY:  [facility]  PHYSICIAN:  Irene Limbo, MD   DATE OF BIRTH:  08-Jul-1975  DATE OF CONSULTATION:  01/25/2014                                CONSULTATION   CHIEF COMPLAINT:  Pressure ulcers of right foot.  HISTORY OF PRESENT ILLNESS:  The patient is a 39 year old male with history of multiple sclerosis and quadriparesis that is accompanied by his wife and a full-time Marine scientist.  The patient is currently living at home.  He has been a previous [facility] patient.  The patient has a history of multiple pressure ulcers over his sacrum as well as the left heel that have gone on to heal with local wound care.  The patient is currently completely bedbound and has a low air loss mattress at home. He is unable to wear any offloading boots for his lower extremities given his severe contractures.  He is completely tube fed through G- tube.  There are no recent laboratories for review.  The patient today presents with approximately 1-week history of dark discoloration and blisters that developed over his right heel and right great toe from a pillow.  There is no drainage from the wound presently.  PAST MEDICAL HISTORY:  Includes multiple sclerosis, quadriparesis, dysphagia, bladder calculi, neurogenic bladder, recurrent upper respiratory infection with history of aspiration pneumonia.  PAST SURGICAL HISTORY:  Includes G-tube.  The patient is a former smoker and quit in 2001.  ALLERGIES:  No known drug allergies.  MEDICATIONS:  Include Norco, Diflucan, Desitin, nystatin, Tylenol, Dulcolax, albuterol, milk of magnesia, Prilosec, baclofen, Wellbutrin, Valium, Keppra, Osmolite 3 cans per day, nystatin powder, DuoNeb, dantrolene, Allegra, MiraLax.  PHYSICAL EXAMINATION:  VITAL SIGNS:  Weight is 180 pounds, height is 6 feet 4 inches.  Blood pressure is  112/72, respirations are 16, pulse is 90, temperature is 98.   The left ABI is calculated as 1.06.  The patient is completely nonverbal and does not respond to exam.  There are severe contractures of bilateral hips and bilateral knees of greater than 90 degrees.  Over his right heel and right mid foot, there are 2 dark eschars in place without surrounding erythema.  Right mid heel is measured as 2.7 x 3.8 x 0.1 cm and right foot is measured as 1 x 1.4 x 0.1 cm.  Knife was used to sharply excise all of the eschar present.  Following removal of this, the patient has completely epithelialized skin beneath this.    PLAN:We again reviewed with the family.  He is at constant risk for pressure ulcers.  They seem to be doing very well with regard to changing his position and examining him for any new sores.  We will plan to discharge him from clinic. Areas were covered with foam today and they may return as needed.          ______________________________ Irene Limbo, MD MBA     BT/MEDQ  D:  01/25/2014  T:  01/26/2014  Job:  [id number]

## 2014-02-02 ENCOUNTER — Encounter (HOSPITAL_BASED_OUTPATIENT_CLINIC_OR_DEPARTMENT_OTHER): Payer: Commercial Managed Care - HMO

## 2014-02-15 ENCOUNTER — Encounter (HOSPITAL_BASED_OUTPATIENT_CLINIC_OR_DEPARTMENT_OTHER): Payer: Commercial Managed Care - HMO | Attending: Plastic Surgery

## 2014-10-20 ENCOUNTER — Encounter (HOSPITAL_COMMUNITY): Payer: Self-pay

## 2014-10-20 ENCOUNTER — Inpatient Hospital Stay (HOSPITAL_COMMUNITY)
Admission: EM | Admit: 2014-10-20 | Discharge: 2014-11-02 | DRG: 870 | Disposition: A | Discharge status: Home Health | Payer: Commercial Managed Care - HMO | Attending: Internal Medicine | Admitting: Internal Medicine

## 2014-10-20 ENCOUNTER — Emergency Department (HOSPITAL_COMMUNITY): Payer: Commercial Managed Care - HMO

## 2014-10-20 ENCOUNTER — Inpatient Hospital Stay (HOSPITAL_COMMUNITY): Payer: Commercial Managed Care - HMO

## 2014-10-20 DIAGNOSIS — J9601 Acute respiratory failure with hypoxia: Secondary | ICD-10-CM | POA: Diagnosis not present

## 2014-10-20 DIAGNOSIS — R0989 Other specified symptoms and signs involving the circulatory and respiratory systems: Secondary | ICD-10-CM

## 2014-10-20 DIAGNOSIS — Z87440 Personal history of urinary (tract) infections: Secondary | ICD-10-CM | POA: Diagnosis not present

## 2014-10-20 DIAGNOSIS — K567 Ileus, unspecified: Secondary | ICD-10-CM | POA: Diagnosis not present

## 2014-10-20 DIAGNOSIS — Y846 Urinary catheterization as the cause of abnormal reaction of the patient, or of later complication, without mention of misadventure at the time of the procedure: Secondary | ICD-10-CM | POA: Diagnosis present

## 2014-10-20 DIAGNOSIS — G35D Multiple sclerosis, unspecified: Secondary | ICD-10-CM | POA: Diagnosis present

## 2014-10-20 DIAGNOSIS — G8250 Quadriplegia, unspecified: Secondary | ICD-10-CM | POA: Diagnosis present

## 2014-10-20 DIAGNOSIS — R627 Adult failure to thrive: Secondary | ICD-10-CM | POA: Diagnosis present

## 2014-10-20 DIAGNOSIS — K279 Peptic ulcer, site unspecified, unspecified as acute or chronic, without hemorrhage or perforation: Secondary | ICD-10-CM | POA: Diagnosis present

## 2014-10-20 DIAGNOSIS — N319 Neuromuscular dysfunction of bladder, unspecified: Secondary | ICD-10-CM | POA: Diagnosis present

## 2014-10-20 DIAGNOSIS — Z681 Body mass index (BMI) 19 or less, adult: Secondary | ICD-10-CM | POA: Diagnosis not present

## 2014-10-20 DIAGNOSIS — Z79899 Other long term (current) drug therapy: Secondary | ICD-10-CM

## 2014-10-20 DIAGNOSIS — D649 Anemia, unspecified: Secondary | ICD-10-CM | POA: Diagnosis present

## 2014-10-20 DIAGNOSIS — Z87891 Personal history of nicotine dependence: Secondary | ICD-10-CM

## 2014-10-20 DIAGNOSIS — J690 Pneumonitis due to inhalation of food and vomit: Secondary | ICD-10-CM | POA: Diagnosis present

## 2014-10-20 DIAGNOSIS — D72829 Elevated white blood cell count, unspecified: Secondary | ICD-10-CM | POA: Diagnosis present

## 2014-10-20 DIAGNOSIS — A419 Sepsis, unspecified organism: Secondary | ICD-10-CM | POA: Diagnosis present

## 2014-10-20 DIAGNOSIS — E871 Hypo-osmolality and hyponatremia: Secondary | ICD-10-CM | POA: Diagnosis present

## 2014-10-20 DIAGNOSIS — L8990 Pressure ulcer of unspecified site, unspecified stage: Secondary | ICD-10-CM

## 2014-10-20 DIAGNOSIS — Z825 Family history of asthma and other chronic lower respiratory diseases: Secondary | ICD-10-CM | POA: Diagnosis not present

## 2014-10-20 DIAGNOSIS — Z9911 Dependence on respirator [ventilator] status: Secondary | ICD-10-CM

## 2014-10-20 DIAGNOSIS — E872 Acidosis: Secondary | ICD-10-CM | POA: Diagnosis present

## 2014-10-20 DIAGNOSIS — R1310 Dysphagia, unspecified: Secondary | ICD-10-CM | POA: Diagnosis present

## 2014-10-20 DIAGNOSIS — Z931 Gastrostomy status: Secondary | ICD-10-CM

## 2014-10-20 DIAGNOSIS — E440 Moderate protein-calorie malnutrition: Secondary | ICD-10-CM | POA: Diagnosis present

## 2014-10-20 DIAGNOSIS — T83511A Infection and inflammatory reaction due to indwelling urethral catheter, initial encounter: Secondary | ICD-10-CM | POA: Diagnosis present

## 2014-10-20 DIAGNOSIS — G40909 Epilepsy, unspecified, not intractable, without status epilepticus: Secondary | ICD-10-CM

## 2014-10-20 DIAGNOSIS — T83511D Infection and inflammatory reaction due to indwelling urethral catheter, subsequent encounter: Secondary | ICD-10-CM | POA: Diagnosis not present

## 2014-10-20 DIAGNOSIS — G35 Multiple sclerosis: Secondary | ICD-10-CM | POA: Diagnosis not present

## 2014-10-20 DIAGNOSIS — J45901 Unspecified asthma with (acute) exacerbation: Secondary | ICD-10-CM | POA: Diagnosis not present

## 2014-10-20 DIAGNOSIS — L8992 Pressure ulcer of unspecified site, stage 2: Secondary | ICD-10-CM | POA: Diagnosis present

## 2014-10-20 DIAGNOSIS — F329 Major depressive disorder, single episode, unspecified: Secondary | ICD-10-CM | POA: Diagnosis present

## 2014-10-20 DIAGNOSIS — J9600 Acute respiratory failure, unspecified whether with hypoxia or hypercapnia: Secondary | ICD-10-CM

## 2014-10-20 DIAGNOSIS — T17890A Other foreign object in other parts of respiratory tract causing asphyxiation, initial encounter: Secondary | ICD-10-CM | POA: Diagnosis not present

## 2014-10-20 DIAGNOSIS — T17500A Unspecified foreign body in bronchus causing asphyxiation, initial encounter: Secondary | ICD-10-CM

## 2014-10-20 DIAGNOSIS — R140 Abdominal distension (gaseous): Secondary | ICD-10-CM

## 2014-10-20 DIAGNOSIS — R0602 Shortness of breath: Secondary | ICD-10-CM

## 2014-10-20 DIAGNOSIS — J95850 Mechanical complication of respirator: Secondary | ICD-10-CM

## 2014-10-20 DIAGNOSIS — G9341 Metabolic encephalopathy: Secondary | ICD-10-CM | POA: Diagnosis present

## 2014-10-20 DIAGNOSIS — R578 Other shock: Secondary | ICD-10-CM | POA: Diagnosis not present

## 2014-10-20 DIAGNOSIS — X58XXXA Exposure to other specified factors, initial encounter: Secondary | ICD-10-CM | POA: Diagnosis not present

## 2014-10-20 DIAGNOSIS — N390 Urinary tract infection, site not specified: Secondary | ICD-10-CM | POA: Diagnosis present

## 2014-10-20 DIAGNOSIS — E43 Unspecified severe protein-calorie malnutrition: Secondary | ICD-10-CM | POA: Diagnosis present

## 2014-10-20 DIAGNOSIS — T883XXA Malignant hyperthermia due to anesthesia, initial encounter: Secondary | ICD-10-CM | POA: Diagnosis present

## 2014-10-20 DIAGNOSIS — E876 Hypokalemia: Secondary | ICD-10-CM | POA: Diagnosis present

## 2014-10-20 DIAGNOSIS — L89152 Pressure ulcer of sacral region, stage 2: Secondary | ICD-10-CM | POA: Diagnosis present

## 2014-10-20 DIAGNOSIS — R509 Fever, unspecified: Secondary | ICD-10-CM

## 2014-10-20 DIAGNOSIS — L89892 Pressure ulcer of other site, stage 2: Secondary | ICD-10-CM | POA: Diagnosis present

## 2014-10-20 DIAGNOSIS — J9809 Other diseases of bronchus, not elsewhere classified: Secondary | ICD-10-CM | POA: Diagnosis not present

## 2014-10-20 DIAGNOSIS — J9690 Respiratory failure, unspecified, unspecified whether with hypoxia or hypercapnia: Secondary | ICD-10-CM

## 2014-10-20 DIAGNOSIS — Z431 Encounter for attention to gastrostomy: Secondary | ICD-10-CM

## 2014-10-20 DIAGNOSIS — IMO0001 Reserved for inherently not codable concepts without codable children: Secondary | ICD-10-CM | POA: Diagnosis present

## 2014-10-20 HISTORY — DX: Malignant hyperthermia due to anesthesia, initial encounter: T88.3XXA

## 2014-10-20 LAB — URINALYSIS, ROUTINE W REFLEX MICROSCOPIC
Bilirubin Urine: NEGATIVE
GLUCOSE, UA: NEGATIVE mg/dL
KETONES UR: NEGATIVE mg/dL
Nitrite: POSITIVE — AB
PROTEIN: NEGATIVE mg/dL
Specific Gravity, Urine: 1.023 (ref 1.005–1.030)
Urobilinogen, UA: 0.2 mg/dL (ref 0.0–1.0)
pH: 6.5 (ref 5.0–8.0)

## 2014-10-20 LAB — COMPREHENSIVE METABOLIC PANEL
ALK PHOS: 101 U/L (ref 38–126)
ALT: 22 U/L (ref 17–63)
ALT: 17 U/L (ref 17–63)
ANION GAP: 8 (ref 5–15)
AST: 25 U/L (ref 15–41)
AST: 30 U/L (ref 15–41)
Albumin: 4.6 g/dL (ref 3.5–5.0)
Albumin: 4.1 g/dL (ref 3.5–5.0)
Alkaline Phosphatase: 94 U/L (ref 38–126)
Anion gap: 8 (ref 5–15)
BILIRUBIN TOTAL: 0.4 mg/dL (ref 0.3–1.2)
BUN: 13 mg/dL (ref 6–20)
BUN: 11 mg/dL (ref 6–20)
CALCIUM: 9.2 mg/dL (ref 8.9–10.3)
CHLORIDE: 101 mmol/L (ref 101–111)
CHLORIDE: 104 mmol/L (ref 101–111)
CO2: 28 mmol/L (ref 22–32)
CO2: 24 mmol/L (ref 22–32)
CREATININE: 0.51 mg/dL — AB (ref 0.61–1.24)
Calcium: 8.5 mg/dL — ABNORMAL LOW (ref 8.9–10.3)
Creatinine, Ser: 0.57 mg/dL — ABNORMAL LOW (ref 0.61–1.24)
GFR calc Af Amer: >60 mL/min (ref >60)
Glucose, Bld: 110 mg/dL — ABNORMAL HIGH (ref 65–99)
Glucose, Bld: 95 mg/dL (ref 65–99)
POTASSIUM: 4.8 mmol/L (ref 3.5–5.1)
Potassium: 4.1 mmol/L (ref 3.5–5.1)
Sodium: 137 mmol/L (ref 135–145)
Sodium: 136 mmol/L (ref 135–145)
Total Bilirubin: 0.6 mg/dL (ref 0.3–1.2)
Total Protein: 8.1 g/dL (ref 6.5–8.1)
Total Protein: 7.6 g/dL (ref 6.5–8.1)

## 2014-10-20 LAB — I-STAT CG4 LACTIC ACID, ED: Lactic Acid, Venous: 2.53 mmol/L (ref 0.5–2.0)

## 2014-10-20 LAB — CBC WITH DIFFERENTIAL/PLATELET
BASOS PCT: 0 %
Basophils Absolute: 0 10*3/uL (ref 0.0–0.1)
Basophils Absolute: 0 10*3/uL (ref 0.0–0.1)
Basophils Relative: 0 %
EOS ABS: 0 10*3/uL (ref 0.0–0.7)
EOS ABS: 0 10*3/uL (ref 0.0–0.7)
Eosinophils Relative: 0 %
Eosinophils Relative: 0 %
HCT: 41.7 % (ref 39.0–52.0)
HEMATOCRIT: 39.2 % (ref 39.0–52.0)
HEMOGLOBIN: 12.6 g/dL — AB (ref 13.0–17.0)
Hemoglobin: 13.5 g/dL (ref 13.0–17.0)
LYMPHS ABS: 0.7 10*3/uL (ref 0.7–4.0)
LYMPHS ABS: 1 10*3/uL (ref 0.7–4.0)
LYMPHS PCT: 5 %
Lymphocytes Relative: 3 %
MCH: 31.3 pg (ref 26.0–34.0)
MCH: 30.8 pg (ref 26.0–34.0)
MCHC: 32.4 g/dL (ref 30.0–36.0)
MCHC: 32.1 g/dL (ref 30.0–36.0)
MCV: 96.5 fL (ref 78.0–100.0)
MCV: 95.8 fL (ref 78.0–100.0)
MONOS PCT: 6 %
Monocytes Absolute: 1.1 10*3/uL — ABNORMAL HIGH (ref 0.1–1.0)
Monocytes Absolute: 1.2 10*3/uL — ABNORMAL HIGH (ref 0.1–1.0)
Monocytes Relative: 5 %
NEUTROS PCT: 89 %
Neutro Abs: 20.7 10*3/uL — ABNORMAL HIGH (ref 1.7–7.7)
Neutro Abs: 16.7 10*3/uL — ABNORMAL HIGH (ref 1.7–7.7)
Neutrophils Relative %: 92 %
PLATELETS: 264 10*3/uL (ref 150–400)
Platelets: 231 10*3/uL (ref 150–400)
RBC: 4.32 MIL/uL (ref 4.22–5.81)
RBC: 4.09 MIL/uL — ABNORMAL LOW (ref 4.22–5.81)
RDW: 12.7 % (ref 11.5–15.5)
RDW: 12.9 % (ref 11.5–15.5)
WBC: 22.5 10*3/uL — AB (ref 4.0–10.5)
WBC: 18.9 10*3/uL — ABNORMAL HIGH (ref 4.0–10.5)

## 2014-10-20 LAB — RAPID URINE DRUG SCREEN, HOSP PERFORMED
AMPHETAMINES: NOT DETECTED
BENZODIAZEPINES: POSITIVE — AB
Barbiturates: NOT DETECTED
COCAINE: NOT DETECTED
OPIATES: NOT DETECTED
Tetrahydrocannabinol: NOT DETECTED

## 2014-10-20 LAB — LACTIC ACID, PLASMA
LACTIC ACID, VENOUS: 2 mmol/L (ref 0.5–2.0)
LACTIC ACID, VENOUS: 2.3 mmol/L — AB (ref 0.5–2.0)

## 2014-10-20 LAB — PROTIME-INR
INR: 1.16 (ref 0.00–1.49)
Prothrombin Time: 15 seconds (ref 11.6–15.2)

## 2014-10-20 LAB — URINE MICROSCOPIC-ADD ON

## 2014-10-20 LAB — PROCALCITONIN

## 2014-10-20 LAB — APTT: APTT: 34 s (ref 24–37)

## 2014-10-20 LAB — PHOSPHORUS: Phosphorus: 1.8 mg/dL — ABNORMAL LOW (ref 2.5–4.6)

## 2014-10-20 LAB — MAGNESIUM: Magnesium: 1.9 mg/dL (ref 1.7–2.4)

## 2014-10-20 MED ORDER — SODIUM CHLORIDE 0.9 % IV SOLN
INTRAVENOUS | Status: DC
  Administered 2014-10-20 – 2014-10-21 (×2): via INTRAVENOUS
  Administered 2014-10-21 – 2014-10-22 (×2): 1000 mL via INTRAVENOUS

## 2014-10-20 MED ORDER — FREE WATER
240.0000 mL | Status: DC
  Administered 2014-10-20 – 2014-10-22 (×10): 240 mL

## 2014-10-20 MED ORDER — VANCOMYCIN HCL IN DEXTROSE 1-5 GM/200ML-% IV SOLN: 1000.0000 mg | Freq: Once | INTRAVENOUS | Status: DC

## 2014-10-20 MED ORDER — CEFTAZIDIME 2 G IJ SOLR
2.0000 g | Freq: Three times a day (TID) | INTRAMUSCULAR | Status: DC
  Filled 2014-10-20: qty 2

## 2014-10-20 MED ORDER — MAGNESIUM HYDROXIDE 400 MG/5ML PO SUSP
30.0000 mL | Freq: Two times a day (BID) | ORAL | Status: DC | PRN
  Administered 2014-10-24 – 2014-10-27 (×2): 30 mL via ORAL
  Filled 2014-10-20 (×2): qty 30

## 2014-10-20 MED ORDER — BUPROPION HCL 75 MG PO TABS
75.0000 mg | ORAL_TABLET | Freq: Three times a day (TID) | ORAL | Status: DC
  Administered 2014-10-20 – 2014-11-02 (×38): 75 mg
  Filled 2014-10-20 (×44): qty 1

## 2014-10-20 MED ORDER — ACETAMINOPHEN 325 MG PO TABS
650.0000 mg | ORAL_TABLET | Freq: Four times a day (QID) | ORAL | Status: DC | PRN
  Administered 2014-10-21 – 2014-10-27 (×9): 650 mg via ORAL
  Filled 2014-10-20 (×11): qty 2

## 2014-10-20 MED ORDER — SODIUM CHLORIDE 0.9 % IV BOLUS (SEPSIS)
1000.0000 mL | Freq: Once | INTRAVENOUS | Status: AC
  Administered 2014-10-21: 1000 mL via INTRAVENOUS

## 2014-10-20 MED ORDER — VANCOMYCIN HCL 10 G IV SOLR
1500.0000 mg | Freq: Once | INTRAVENOUS | Status: AC
  Administered 2014-10-20: 1500 mg via INTRAVENOUS
  Filled 2014-10-20: qty 1500

## 2014-10-20 MED ORDER — POLYETHYLENE GLYCOL 3350 17 G PO PACK
17.0000 g | PACK | Freq: Every day | ORAL | Status: DC
  Administered 2014-10-20 – 2014-11-02 (×13): 17 g
  Filled 2014-10-20 (×13): qty 1

## 2014-10-20 MED ORDER — ADULT MULTIVITAMIN W/MINERALS CH
1.0000 | ORAL_TABLET | Freq: Every day | ORAL | Status: DC
  Administered 2014-10-20 – 2014-10-22 (×3): 1
  Filled 2014-10-20 (×4): qty 1

## 2014-10-20 MED ORDER — VANCOMYCIN HCL IN DEXTROSE 1-5 GM/200ML-% IV SOLN
1000.0000 mg | Freq: Two times a day (BID) | INTRAVENOUS | Status: AC
  Administered 2014-10-21 – 2014-10-22 (×4): 1000 mg via INTRAVENOUS
  Filled 2014-10-20 (×4): qty 200

## 2014-10-20 MED ORDER — NALTREXONE HCL POWD
4.5000 mg | Freq: Every day | Status: DC
  Administered 2014-10-20: 4.5 mg via ORAL

## 2014-10-20 MED ORDER — BISACODYL 10 MG RE SUPP: 10.0000 mg | RECTAL | Status: DC | PRN

## 2014-10-20 MED ORDER — NYSTATIN 100000 UNIT/GM EX POWD
1.0000 g | Freq: Every day | CUTANEOUS | Status: DC
  Administered 2014-10-20 – 2014-11-02 (×14): 1 g via TOPICAL
  Filled 2014-10-20 (×2): qty 15

## 2014-10-20 MED ORDER — SENNA 8.6 MG PO TABS
2.0000 | ORAL_TABLET | Freq: Every day | ORAL | Status: DC
  Administered 2014-10-20 – 2014-11-02 (×13): 17.2 mg via ORAL
  Filled 2014-10-20 (×13): qty 2

## 2014-10-20 MED ORDER — ONDANSETRON HCL 4 MG/2ML IJ SOLN: 4.0000 mg | Freq: Four times a day (QID) | INTRAMUSCULAR | Status: DC | PRN

## 2014-10-20 MED ORDER — LEVETIRACETAM 100 MG/ML PO SOLN
1000.0000 mg | Freq: Two times a day (BID) | ORAL | Status: DC
  Administered 2014-10-20 – 2014-11-02 (×26): 1000 mg
  Filled 2014-10-20 (×30): qty 10

## 2014-10-20 MED ORDER — PRESCRIPTION MEDICATION: 4.5000 mg | Freq: Every day | Status: DC

## 2014-10-20 MED ORDER — VITAMIN C 500 MG/5ML PO SYRP
500.0000 mg | ORAL_SOLUTION | Freq: Two times a day (BID) | ORAL | Status: DC
  Administered 2014-10-20 – 2014-11-02 (×26): 500 mg
  Filled 2014-10-20 (×29): qty 5

## 2014-10-20 MED ORDER — DANTROLENE SODIUM 100 MG PO CAPS
200.0000 mg | ORAL_CAPSULE | Freq: Every day | ORAL | Status: DC
  Administered 2014-10-20 – 2014-11-01 (×13): 200 mg via ORAL
  Filled 2014-10-20 (×15): qty 2

## 2014-10-20 MED ORDER — DEXTROSE 5 % IV SOLN
1.0000 g | Freq: Three times a day (TID) | INTRAVENOUS | Status: DC
  Administered 2014-10-20 – 2014-10-21 (×2): 1 g via INTRAVENOUS
  Filled 2014-10-20 (×3): qty 1

## 2014-10-20 MED ORDER — BACLOFEN 10 MG PO TABS
20.0000 mg | ORAL_TABLET | Freq: Three times a day (TID) | ORAL | Status: DC
  Administered 2014-10-20 – 2014-11-02 (×38): 20 mg
  Filled 2014-10-20: qty 1
  Filled 2014-10-20 (×2): qty 2
  Filled 2014-10-20: qty 1
  Filled 2014-10-20 (×2): qty 2
  Filled 2014-10-20: qty 1
  Filled 2014-10-20 (×15): qty 2
  Filled 2014-10-20: qty 1
  Filled 2014-10-20 (×19): qty 2

## 2014-10-20 MED ORDER — NALTREXONE HCL 50 MG PO TABS: 4.5000 mg | ORAL_TABLET | Freq: Every day | ORAL | Status: DC

## 2014-10-20 MED ORDER — PIPERACILLIN-TAZOBACTAM 3.375 G IVPB 30 MIN
3.3750 g | Freq: Once | INTRAVENOUS | Status: AC
  Administered 2014-10-20: 3.375 g via INTRAVENOUS
  Filled 2014-10-20: qty 50

## 2014-10-20 MED ORDER — POLYVINYL ALCOHOL 1.4 % OP SOLN
1.0000 [drp] | Freq: Two times a day (BID) | OPHTHALMIC | Status: DC
  Administered 2014-10-20 – 2014-11-02 (×25): 1 [drp] via OPHTHALMIC
  Filled 2014-10-20 (×4): qty 15

## 2014-10-20 MED ORDER — DIAZEPAM 5 MG PO TABS
5.0000 mg | ORAL_TABLET | Freq: Every day | ORAL | Status: DC
  Administered 2014-10-20 – 2014-10-21 (×2): 5 mg via ORAL
  Filled 2014-10-20 (×2): qty 1

## 2014-10-20 MED ORDER — VITAMIN D 1000 UNITS PO TABS
5000.0000 [IU] | ORAL_TABLET | Freq: Every day | ORAL | Status: DC
  Administered 2014-10-20: 5000 [IU] via ORAL
  Administered 2014-10-21: 6000 [IU] via ORAL
  Administered 2014-10-22 – 2014-11-02 (×12): 5000 [IU] via ORAL
  Filled 2014-10-20 (×15): qty 5

## 2014-10-20 MED ORDER — ONDANSETRON HCL 4 MG PO TABS: 4.0000 mg | ORAL_TABLET | Freq: Four times a day (QID) | ORAL | Status: DC | PRN

## 2014-10-20 MED ORDER — ACETAMINOPHEN 500 MG PO TABS
1000.0000 mg | ORAL_TABLET | ORAL | Status: AC
  Administered 2014-10-20: 1000 mg via ORAL
  Filled 2014-10-20: qty 2

## 2014-10-20 MED ORDER — IPRATROPIUM-ALBUTEROL 0.5-2.5 (3) MG/3ML IN SOLN
3.0000 mL | Freq: Two times a day (BID) | RESPIRATORY_TRACT | Status: DC
  Administered 2014-10-20: 3 mL via RESPIRATORY_TRACT
  Filled 2014-10-20: qty 3

## 2014-10-20 MED ORDER — HYDROCODONE-ACETAMINOPHEN 7.5-325 MG PO TABS
1.0000 | ORAL_TABLET | Freq: Four times a day (QID) | ORAL | Status: DC | PRN
  Administered 2014-10-20 – 2014-10-22 (×4): 1
  Filled 2014-10-20 (×4): qty 1

## 2014-10-20 MED ORDER — SODIUM CHLORIDE 0.9 % IV BOLUS (SEPSIS)
1000.0000 mL | Freq: Once | INTRAVENOUS | Status: AC
  Administered 2014-10-20: 1000 mL via INTRAVENOUS

## 2014-10-20 MED ORDER — OSMOLITE 1.5 CAL PO LIQD
1000.0000 mL | ORAL | Status: DC
  Filled 2014-10-20: qty 1000

## 2014-10-20 MED ORDER — PANTOPRAZOLE SODIUM 40 MG PO PACK
40.0000 mg | PACK | Freq: Every day | ORAL | Status: DC
  Administered 2014-10-21 – 2014-11-02 (×13): 40 mg
  Filled 2014-10-20 (×13): qty 20

## 2014-10-20 MED ORDER — RISAQUAD PO CAPS
1.0000 | ORAL_CAPSULE | Freq: Every day | ORAL | Status: DC
  Administered 2014-10-20 – 2014-11-02 (×14): 1 via ORAL
  Filled 2014-10-20 (×14): qty 1

## 2014-10-20 MED ORDER — LORATADINE 10 MG PO TABS
10.0000 mg | ORAL_TABLET | Freq: Every day | ORAL | Status: DC
  Administered 2014-10-21 – 2014-11-02 (×13): 10 mg via ORAL
  Filled 2014-10-20 (×14): qty 1

## 2014-10-20 MED ORDER — OMEPRAZOLE 2 MG/ML ORAL SUSPENSION: 40.0000 mg | Freq: Every day | ORAL | Status: DC

## 2014-10-20 MED ORDER — SODIUM CHLORIDE 0.9 % IJ SOLN
3.0000 mL | Freq: Two times a day (BID) | INTRAMUSCULAR | Status: DC
  Administered 2014-10-20 – 2014-11-02 (×25): 3 mL via INTRAVENOUS

## 2014-10-20 NOTE — ED Notes (Signed)
UNABLE  TO COLLECT LABS I LOOKED AND DID NOT SEE ANYTHING.  I MADE NURSE AND MD AWARE.

## 2014-10-20 NOTE — Progress Notes (Signed)
CRITICAL VALUE ALERT   Critical value received:  Lactic Acid 2.3  Date of notification:  10/20/2014  Time of notification:  2310  Critical value read back:Yes.    Nurse who received alert:  Carnella Guadalajara  MD notified (1st page):  Tylene Fantasia, NP  Time of first page:  2312  Responding MD:  Tylene Fantasia, NP  Time MD responded:  2323  NP gave orders for bolus x2, pts RN made aware.

## 2014-10-20 NOTE — ED Notes (Signed)
Patient given at bedside to floor nurse, Amber.

## 2014-10-20 NOTE — ED Provider Notes (Signed)
CSN: [id number]     Arrival date & time 10/20/14  1134 History   First MD Initiated Contact with Patient 10/20/14 1142     Chief Complaint  Patient presents with  . Seizures  . Fever     (Consider location/radiation/quality/duration/timing/severity/associated sxs/prior Treatment) HPI Comments: Noted to have a fever this morning by caregiver. He had some mild left-sided facial twitching. Family was concerned this was a seizure. He has not missed any doses of his seizure medication. History of suprapubic catheter due to his MS. MS is debilitating and patient is contracted and nonverbal. Last suprapubic catheter change was last week.  Patient is a 39 y.o. male presenting with fever. The history is provided by the patient.  Fever Temp source:  Subjective Severity:  Moderate Onset quality:  Sudden Timing:  Constant Progression:  Unchanged Chronicity:  New Relieved by:  Acetaminophen Worsened by:  Nothing tried Associated symptoms: no chills   Associated symptoms comment:  Had some mild face twitching that was concerning for seizure Risk factors: no hx of cancer     Past Medical History  Diagnosis Date  . MS (multiple sclerosis)   . 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 Childhood asthma   . Depression   . Neuromuscular disorder     Quadraperesis  . Recurrent UTI   . Dysphagia   . Bladder calculi   . Neurogenic bladder   . Shortness of breath   . Recurrent upper respiratory infection (URI)   . Normocytic anemia 05/28/2011  . Aspiration pneumonia 01/20/2013   Past Surgical History  Procedure Laterality Date  . Lumbar puncture  10/12/2002  . Gastrostomy  04/16/2011    Procedure: GASTROSTOMY;  Surgeon: Zenovia Jarred, MD;  Location: [facility] OR;  Service: General;  Laterality: N/A;  Open G-Tube placement   Family History  Problem Relation Age of Onset  . Asthma Mother    Social History  Substance Use Topics  . Smoking status: Former Smoker    Types: Cigarettes    Quit  date: 02/02/1999  . Smokeless tobacco: Former Systems developer  . Alcohol Use: No    Review of Systems  Unable to perform ROS: Patient nonverbal  Constitutional: Positive for fever. Negative for chills.  Endocrine: Positive for polyphagia.      Allergies  Review of patient's allergies indicates no known allergies.  Home Medications   Prior to Admission medications   Medication Sig Start Date End Date Taking? Authorizing Provider  acetaminophen (TYLENOL) 500 MG tablet 1,000 mg by PEG Tube route 2 (two) times daily.   Yes Historical Provider, MD  ascorbic acid (VITAMIN C) 500 MG/5ML syrup Place 5 mLs (500 mg total) into feeding tube 2 (two) times daily. 01/24/13  Yes Bonnielee Haff, MD  baclofen (LIORESAL) 20 MG tablet Place 1 tablet (20 mg total) into feeding tube 3 (three) times daily. 01/24/13  Yes Bonnielee Haff, MD  bisacodyl (DULCOLAX) 10 MG suppository Place 10 mg rectally as needed for moderate constipation.   Yes Historical Provider, MD  buPROPion (WELLBUTRIN) 75 MG tablet Place 1 tablet (75 mg total) into feeding tube 3 (three) times daily. 01/24/13  Yes Bonnielee Haff, MD  cholecalciferol (VITAMIN D) 1000 UNITS tablet Take 1 tablet (1,000 Units total) by mouth 2 (two) times daily. Patient taking differently: Take 5,000 Units by mouth 2 (two) times daily.  01/24/13  Yes Bonnielee Haff, MD  Cranberry Juice Powder 425 MG CAPS 1 capsule (425 mg total) by PEG Tube route 2 (two) times daily.  01/24/13  Yes Bonnielee Haff, MD  dantrolene (DANTRIUM) 50 MG capsule Take 2,000 mg by mouth daily.   Yes Historical Provider, MD  Diaper Rash Products (DESITIN EX) Apply 1 application topically as needed (may apply to minor skin breakdown until healed).   Yes Historical Provider, MD  diazepam (VALIUM) 5 MG tablet Take 5 mg by mouth at bedtime.   Yes Historical Provider, MD  fexofenadine (ALLEGRA) 180 MG tablet Place 180 mg into feeding tube daily.   Yes Historical Provider, MD  Garlic Oil (ODORLESS GARLIC) 009  MG TABS 3,818 mg by PEG Tube route daily.    Yes Historical Provider, MD  HYDROcodone-acetaminophen (NORCO) 7.5-325 MG per tablet Place 1 tablet into feeding tube every 6 (six) hours as needed (pain).   Yes Historical Provider, MD  ipratropium-albuterol (DUONEB) 0.5-2.5 (3) MG/3ML SOLN Take 3 mLs by nebulization every 4 (four) hours as needed (Shortness of breath).  02/12/13  Yes Historical Provider, MD  ipratropium-albuterol (DUONEB) 0.5-2.5 (3) MG/3ML SOLN Take 3 mLs by nebulization 2 (two) times daily.   Yes Historical Provider, MD  Lactobacillus (ACIDOPHILUS PO) Place 1 tablet into feeding tube as directed.    Yes Historical Provider, MD  levETIRAcetam (KEPPRA) 100 MG/ML solution Place 1,000 mg into feeding tube 2 (two) times daily. 01/24/13  Yes Bonnielee Haff, MD  magnesium hydroxide (MILK OF MAGNESIA) 400 MG/5ML suspension Take 30 mLs by mouth every 12 (twelve) hours as needed for mild constipation.   Yes Historical Provider, MD  Multiple Vitamin (MULTIVITAMIN WITH MINERALS) TABS tablet Place 1 tablet into feeding tube daily. 01/24/13  Yes Bonnielee Haff, MD  Nutritional Supplements (FEEDING SUPPLEMENT, OSMOLITE 1.5 CAL,) LIQD Place 1,000 mLs into feeding tube continuous. At 98ml/HR Patient taking differently: Place 1,000 mLs into feeding tube continuous. At 76ml/HR 01/24/13  Yes Bonnielee Haff, MD  nystatin (MYCOSTATIN/NYSTOP) 100000 UNIT/GM POWD Apply 1 g topically 3 (three) times daily. Patient taking differently: Apply 1 g topically daily. Applies to groin 01/24/13  Yes Bonnielee Haff, MD  omeprazole (PRILOSEC) 2 mg/mL SUSP Place 20 mLs (40 mg total) into feeding tube daily. 01/24/13  Yes Bonnielee Haff, MD  polyethylene glycol (MIRALAX / GLYCOLAX) packet Place 17 g into feeding tube daily. 01/24/13  Yes Bonnielee Haff, MD  polyvinyl alcohol (LIQUIFILM TEARS) 1.4 % ophthalmic solution Place 1 drop into both eyes 3 (three) times daily. Patient taking differently: Place 1 drop into both eyes 2 (two)  times daily.  01/24/13  Yes Bonnielee Haff, MD  senna (SENOKOT) 8.6 MG TABS tablet Take 2 tablets by mouth daily.   Yes Historical Provider, MD  sodium phosphate (FLEET) enema Place 1 enema rectally once as needed (for constipation). follow package directions   Yes Historical Provider, MD  Water For Irrigation, Sterile (FREE WATER) SOLN Place 240 mLs into feeding tube every 4 (four) hours. 01/24/13  Yes Bonnielee Haff, MD  acetaminophen (TYLENOL) 160 MG/5ML elixir Take 40 mg by mouth every 4 (four) hours as needed for fever or pain.    Historical Provider, MD  Amino Acids-Protein Hydrolys (FEEDING SUPPLEMENT, PRO-STAT SUGAR FREE 64,) LIQD Place 30 mLs into feeding tube 3 (three) times daily. 01/24/13   Bonnielee Haff, MD  fosfomycin (MONUROL) 3 G PACK Place 3 g into feeding tube every 3 (three) days. ON 6/28 ONCE Patient not taking: Reported on 10/20/2014 07/03/13   Delfina Redwood, MD  oxybutynin (DITROPAN) 5 MG tablet Place 5 mg into feeding tube 2 (two) times daily.    Historical Provider,  MD   BP 132/73 mmHg  Pulse 98  Temp(Src) 103 F (39.4 C) (Rectal)  Resp 20  Wt 165 lb (74.844 kg)  SpO2 90% Physical Exam  Constitutional: He appears well-developed and well-nourished. No distress.  HENT:  Head: Normocephalic and atraumatic.  Mouth/Throat: No oropharyngeal exudate.  Eyes: EOM are normal. Pupils are equal, round, and reactive to light.  Neck: Normal range of motion. Neck supple.  Cardiovascular: Normal rate and regular rhythm.  Exam reveals no friction rub.   No murmur heard. Pulmonary/Chest: Effort normal and breath sounds normal. No respiratory distress. He has no wheezes. He has no rales.  Abdominal: He exhibits no distension. There is no tenderness. There is no rebound.  Musculoskeletal: He exhibits no edema.  All extremities contracted  Neurological:  Nonverbal  Skin: He is not diaphoretic.  Nursing note and vitals reviewed.   ED Course  Procedures (including critical  care time) Labs Review Labs Reviewed  CULTURE, BLOOD (ROUTINE X 2)  CULTURE, BLOOD (ROUTINE X 2)  URINE CULTURE  COMPREHENSIVE METABOLIC PANEL  CBC WITH DIFFERENTIAL/PLATELET  URINALYSIS, ROUTINE W REFLEX MICROSCOPIC (NOT AT [facility])  I-STAT CG4 LACTIC ACID, ED    Imaging Review No results found. I have personally reviewed and evaluated these images and lab results as part of my medical decision-making.   EKG Interpretation   Date/Time:  Wednesday October 20 2014 11:56:20 EDT Ventricular Rate:  99 PR Interval:  124 QRS Duration: 94 QT Interval:  355 QTC Calculation: 456 R Axis:   46 Text Interpretation:  Sinus rhythm Borderline T wave abnormalities  baseline artifact. no sig change from old. Confirmed by Johnney Killian, MD,  Jeannie Done [phone]) on 10/20/2014 12:06:24 PM      MDM   Final diagnoses:  UTI (lower urinary tract infection)  Fever, unspecified fever cause    39 year old male with history of debilitating MS, suprapubic catheter presents with a fever. He had a possible seizure described as facial twitching on the left side which is similar to prior seizures. No shortness of breath, vomiting. He was doing better after some acetaminophen but then had persistent fevers. Here workup shows urinary tract infection. He is contracted and nonverbal with at his baseline. Started on broad-spectrum antibiotics. Admitted.   Evelina Bucy, MD 10/20/14 [phone]

## 2014-10-20 NOTE — Progress Notes (Signed)
39 yo male admitted today with respiratory failure secondary to aspiration PNA, a UTI, and sepsis. Started on empiric abx on admit. Hx MS with quadraparesis. On TF at home which have been on hold.  RN, Joellen Jersey, paged this NP secondary to pt's rectal temp of 105.55F. Lungs very rhonchus with congested cough. RR normal. O2 sat 98% on 2L [place]. HR tachy. BP 140s. LA rose to 2.3 this check. Had urine cx and blood cx on admission. CXR on admit showed no disease.  Plan:  Transfer urgently to SDU. Ice to armpits until cooling blanket can be applied. CBC with diff, BMP now. LA at 0100 and 0400 10/21/14.  NS bolus and increase maintenance fluids. Tylenol 1000mg  now.  Cooling blanket in SDU.  R/p CXR now.  Will follow closely. Clance Boll, NP [facility]

## 2014-10-20 NOTE — ED Notes (Signed)
Bed: YY51 Expected date:  Expected time:  Means of arrival:  Comments: EMS- seizure/fever/septic?, Hx of MS

## 2014-10-20 NOTE — ED Notes (Signed)
Foley catheter was clamped and specimen was drawn off of proximal port.

## 2014-10-20 NOTE — ED Notes (Signed)
Got verbal order from Dr Mingo Amber that its ok to do a foot stick.  RN is aware and will put in order.

## 2014-10-20 NOTE — Progress Notes (Signed)
ANTIBIOTIC CONSULT NOTE - INITIAL  Pharmacy Consult for Vancomycin / Ceftazidime Indication: Sepsis  No Known Allergies  Patient Measurements: Weight: 165 lb (74.844 kg) Adjusted Body Weight:   Vital Signs: Temp: 103 F (39.4 C) (10/12 1235) Temp Source: Rectal (10/12 1235) BP: 139/78 mmHg (10/12 1600) Pulse Rate: 122 (10/12 1600) Intake/Output from previous day:   Intake/Output from this shift:    Labs:  Recent Labs  10/20/14 1234  WBC 22.5*  HGB 13.5  PLT 264  CREATININE 0.51*   CrCl cannot be calculated (Unknown ideal weight.). No results for input(s): VANCOTROUGH, VANCOPEAK, VANCORANDOM, GENTTROUGH, GENTPEAK, GENTRANDOM, TOBRATROUGH, TOBRAPEAK, TOBRARND, AMIKACINPEAK, AMIKACINTROU, AMIKACIN in the last 72 hours.   Microbiology: No results found for this or any previous visit (from the past 720 hour(s)).  Medical History: Past Medical History  Diagnosis Date  . MS (multiple sclerosis) ([place])   . Quadriparesis (muscle weakness) ([place]) 03/12/2011  . Childhood asthma   . Depression   . Neuromuscular disorder ([place])     Quadraperesis  . Recurrent UTI   . Dysphagia   . Bladder calculi   . Neurogenic bladder   . Shortness of breath   . Recurrent upper respiratory infection (URI)   . Normocytic anemia 05/28/2011  . Aspiration pneumonia ([place]) 01/20/2013    Assessment: 2 yoM with hx seizures and debilitating MS with suprapubic catheter presents with grand mal seizure and UTI.  Pharmacy consulted to start vancomycin and ceftazidime. Antibiotics started in ED.   Anti-infectives 10/12 >> Zosyn x1 10/12 >> Vancomycin  >> 10/12 >> Ceftazidime  >>    Vitals/Labs WBC: 22.5K Tm24h: 103 SCr: 0.51 - CrCl >100 CG/N, likely overestimated due to bedbound state/quadraperesis  Lactate: 2.53  Cultures 10/12 bloodx2: IP 10/12 urine: IP (U/A cloudy, many bacteria and WBC, large leukocytes, +nitrites)  Goal of Therapy:  Vancomycin trough level 15-20 mcg/ml  Eradication  of infection  Plan:  Vancomycin 1500mg  IV x 1 in ED, then 1g IV q12h Ceftazidime 1g IV q8h F/u renal function, labs, clinical course, VT as Css  Ralene Bathe, PharmD, BCPS 10/20/2014, 4:44 PM  Pager: [phone]

## 2014-10-20 NOTE — H&P (Signed)
[facility] History and Physical  Terry Palmer WVP:[id number] DOB: [birth year] DOA: 10/20/2014  Referring physician: ER physician: Dr. Evelina Bucy  PCP: Reymundo Poll, MD  Chief Complaint: fever   HPI:  39 year old male with past medical history of multiple sclerosis, neurogenic bladder and chronic indwelling foley catheter, peg tube for nutritional support. He has had long standing MS for about past 12 years but has significantly deteriorated over past 4 years per his mother. Ever since feeding tube placement about 4 years ago pt stopped talking. He used to be Tysabri for MS but his MS progressively got worse. He presented today with fevers per his mother report. He was also congested and his lungs sounded rhonchorous. No respiratory distress. No diarrhea. No vomiting. No blood in stool or urine.   In ED, pt was hypoxic with O 2 saturation of 88% on room air. T max was 103 F, HR 122, RR 20. Blood work showed WBC count of 22.5. CXR did not show acute cardiopulmonary findings. UA showed large leukocytes and many bacteria. Sepsis criteria met so he was started on ceftazidime and vanco.   Assessment & Plan    Principal Problem:   Sepsis secondary to UTI ([name]) / UTI (urinary tract infection) due to urinary indwelling Foley catheter (HCC) /  Neurogenic bladder /  Leukocytosis - Sepsis criteria met on admission with fever of 103 F, HR 122, RR 20, hypoxia of 88%, leukocytosis of 22.5. - Source of infections suspected UTI since UA on admission showed large leukocytes and many bacteria. In addition, pt with chronic indwelling foley catheter and nerogenic bladder - Sepsis work up initiated - Obtain blood cultures, urine culture, procalcitonin, lactic acid level - Started ceftazidime and vanco - Hemodynamically stable, does not require pressor support  Active Problems:   Acute respiratory failure with hypoxia (HCC) / Aspiration pneumonitis - Hypoxia likely due to possible aspiration  considering pt on tube feeds and he presented with hypoxia - Stable respiratory status - Abx for sepsis will cover for aspiration pneumonia.     Quadriparesis (muscle weakness) (HCC) /  Multiple sclerosis (HCC) - Stable     FTT (failure to thrive) in adult - In the setting of chronic illness - Nutrition consulted     Sacral pressure ulcer stage 2 - WOC consulted    DVT prophylaxis:  - SCD's bilaterally   Radiological Exams on Admission: Dg Chest 2 View 10/20/2014 No active cardiopulmonary disease. Electronically Signed   By: Marijo Conception, M.D.   On: 10/20/2014 13:24    Code Status: Full Family Communication: Mother at the bedside  Disposition Plan: Admit for further evaluation, telemetry   Heru Montz, Dedra Skeens, MD  [facility] Pager [phone]  Time spent in minutes: 75 minutes  Review of Systems:  Constitutional: positive for fever, chills and malaise/fatigue. Negative for diaphoresis.  HENT: Negative for hearing loss, ear pain, nosebleeds, congestion, sore throat, neck pain, tinnitus and ear discharge.   Eyes: Negative for blurred vision, double vision, photophobia, pain, discharge and redness.  Respiratory: Negative for cough, hemoptysis, sputum production, shortness of breath, wheezing and stridor.   Cardiovascular: Negative for chest pain, palpitations, orthopnea, claudication and leg swelling.  Gastrointestinal: Negative for nausea, vomiting and abdominal pain. Negative for heartburn, constipation, blood in stool and melena.  Genitourinary: Negative for dysuria, urgency, frequency, hematuria and flank pain.  Musculoskeletal: Negative for myalgias, back pain, joint pain and falls.  Skin: Negative for itching and rash.  Neurological: Negative for dizziness and  weakness. Negative for tingling, tremors, sensory change, speech change, focal weakness, loss of consciousness and headaches.  Endo/Heme/Allergies: Negative for environmental allergies and polydipsia. Does not  bruise/bleed easily.  Psychiatric/Behavioral: Negative for suicidal ideas. The patient is not nervous/anxious.      Past Medical History  Diagnosis Date  . MS (multiple sclerosis) ([place])   . Quadriparesis (muscle weakness) ([place]) 03/12/2011  . Childhood asthma   . Depression   . Neuromuscular disorder ([place])     Quadraperesis  . Recurrent UTI   . Dysphagia   . Bladder calculi   . Neurogenic bladder   . Shortness of breath   . Recurrent upper respiratory infection (URI)   . Normocytic anemia 05/28/2011  . Aspiration pneumonia ([place]) 01/20/2013   Past Surgical History  Procedure Laterality Date  . Lumbar puncture  10/12/2002  . Gastrostomy  04/16/2011    Procedure: GASTROSTOMY;  Surgeon: Zenovia Jarred, MD;  Location: [place];  Service: General;  Laterality: N/A;  Open G-Tube placement   Social History:  reports that he quit smoking about 15 years ago. His smoking use included Cigarettes. He has quit using smokeless tobacco. He reports that he does not drink alcohol. His drug history is not on file.  No Known Allergies  Family History:  Family History  Problem Relation Age of Onset  . Asthma Mother      Prior to Admission medications   Medication Sig Start Date End Date Taking? Authorizing Provider  acetaminophen (TYLENOL) 500 MG tablet 1,000 mg by PEG Tube route 2 (two) times daily.   Yes Historical Provider, MD  ascorbic acid (VITAMIN C) 500 MG/5ML syrup Place 5 mLs (500 mg total) into feeding tube 2 (two) times daily. 01/24/13  Yes Bonnielee Haff, MD  baclofen (LIORESAL) 20 MG tablet Place 1 tablet (20 mg total) into feeding tube 3 (three) times daily. 01/24/13  Yes Bonnielee Haff, MD  bisacodyl (DULCOLAX) 10 MG suppository Place 10 mg rectally as needed for moderate constipation.   Yes Historical Provider, MD  buPROPion (WELLBUTRIN) 75 MG tablet Place 1 tablet (75 mg total) into feeding tube 3 (three) times daily. 01/24/13  Yes Bonnielee Haff, MD  cholecalciferol (VITAMIN D) 1000  UNITS tablet Take 1 tablet (1,000 Units total) by mouth 2 (two) times daily. Patient taking differently: Take 5,000 Units by mouth daily.  01/24/13  Yes Bonnielee Haff, MD  Cranberry Juice Powder 425 MG CAPS 1 capsule (425 mg total) by PEG Tube route 2 (two) times daily. 01/24/13  Yes Bonnielee Haff, MD  dantrolene (DANTRIUM) 50 MG capsule Take 200 mg by mouth at bedtime.    Yes Historical Provider, MD  Diaper Rash Products (DESITIN EX) Apply 1 application topically as needed (may apply to minor skin breakdown until healed).   Yes Historical Provider, MD  diazepam (VALIUM) 5 MG tablet Take 5 mg by mouth at bedtime.   Yes Historical Provider, MD  fexofenadine (ALLEGRA) 180 MG tablet Place 180 mg into feeding tube daily.   Yes Historical Provider, MD  FLUCONAZOLE PO Give 1 tablet by tube daily as needed (after an antibiotic course completed).   Yes Historical Provider, MD  Garlic Oil (ODORLESS GARLIC) 416 MG TABS 3,845 mg by PEG Tube route daily.    Yes Historical Provider, MD  HYDROcodone-acetaminophen (NORCO) 7.5-325 MG per tablet Place 1 tablet into feeding tube every 6 (six) hours as needed (pain).   Yes Historical Provider, MD  ipratropium-albuterol (DUONEB) 0.5-2.5 (3) MG/3ML SOLN Take 3 mLs by  nebulization 2 (two) times daily.   Yes Historical Provider, MD  Lactobacillus (ACIDOPHILUS PO) Place 1 tablet into feeding tube as directed.    Yes Historical Provider, MD  levETIRAcetam (KEPPRA) 100 MG/ML solution Place 1,000 mg into feeding tube 2 (two) times daily. 01/24/13  Yes Bonnielee Haff, MD  magnesium hydroxide (MILK OF MAGNESIA) 400 MG/5ML suspension Take 30 mLs by mouth every 12 (twelve) hours as needed for mild constipation.   Yes Historical Provider, MD  Multiple Vitamin (MULTIVITAMIN WITH MINERALS) TABS tablet Place 1 tablet into feeding tube daily. 01/24/13  Yes Bonnielee Haff, MD  NALTREXONE HCL PO Give 4.5 mg by tube at bedtime.   Yes Historical Provider, MD  neomycin-bacitracin-polymyxin  (NEOSPORIN) [phone] ointment Apply 1 application topically daily as needed (for toes).   Yes Historical Provider, MD  Nutritional Supplements (FEEDING SUPPLEMENT, OSMOLITE 1.5 CAL,) LIQD Place 1,000 mLs into feeding tube continuous. At 68m/HR Patient taking differently: Place 1,000 mLs into feeding tube continuous. At 728mHR 01/24/13  Yes GoBonnielee HaffMD  nystatin (MYCOSTATIN/NYSTOP) 100000 UNIT/GM POWD Apply 1 g topically 3 (three) times daily. Patient taking differently: Apply 1 g topically daily. Applies to groin 01/24/13  Yes GoBonnielee HaffMD  omeprazole (PRILOSEC) 2 mg/mL SUSP Place 20 mLs (40 mg total) into feeding tube daily. 01/24/13  Yes GoBonnielee HaffMD  polyethylene glycol (MIRALAX / GLYCOLAX) packet Place 17 g into feeding tube daily. 01/24/13  Yes GoBonnielee HaffMD  polyvinyl alcohol (LIQUIFILM TEARS) 1.4 % ophthalmic solution Place 1 drop into both eyes 3 (three) times daily. Patient taking differently: Place 1 drop into both eyes 2 (two) times daily.  01/24/13  Yes GoBonnielee HaffMD  senna (SENOKOT) 8.6 MG TABS tablet Take 2 tablets by mouth daily.   Yes Historical Provider, MD  sodium phosphate (FLEET) enema Place 1 enema rectally once as needed (for constipation). follow package directions   Yes Historical Provider, MD  Water For Irrigation, Sterile (FREE WATER) SOLN Place 240 mLs into feeding tube every 4 (four) hours. 01/24/13  Yes GoBonnielee HaffMD  AFLURIA PRESERVATIVE FREE 0.5 ML SUSY Inject 0.5 mLs into the skin once. For flu season 10/14/14   Historical Provider, MD   Physical Exam: Filed Vitals:   10/20/14 1410 10/20/14 1430 10/20/14 1524 10/20/14 1530  BP: 137/75 140/74 137/73 147/72  Pulse: 96 99 93 96  Temp:      TempSrc:      Resp: 18  14   Weight:      SpO2: 96% 90% 94% 88%    Physical Exam  Constitutional: Appears well-developed and malnourished. No distress.  HENT: Normocephalic. No tonsillar erythema or exudates Eyes: Conjunctivae are normal. No  scleral icterus.  Neck: Normal ROM. Neck supple. No JVD. No tracheal deviation. No thyromegaly.  CVS: tachycardic, S1/S2 +, no murmurs, no gallops, no carotid bruit.  Pulmonary: rhonchorous, coarse breath sounds, no wheezing   Abdominal: Soft. BS +,  no distension, tenderness, rebound or guarding. (+) PEG Musculoskeletal: Normal range of motion. No edema and no tenderness.  Lymphadenopathy: No lymphadenopathy noted, cervical, inguinal. Neuro: Alert. Quadriplegic, contracted extremities  Skin: Skin is warm and dry. Sacral decubitus ulcer stage 2  Psychiatric: Normal mood and affect. Behavior normal.   Labs on Admission:  Basic Metabolic Panel:  Recent Labs Lab 10/20/14 1234  NA 137  K 4.1  CL 101  CO2 28  GLUCOSE 110*  BUN 13  CREATININE 0.51*  CALCIUM 9.2   Liver Function Tests:  Recent  Labs Lab 10/20/14 1234  AST 25  ALT 22  ALKPHOS 101  BILITOT 0.4  PROT 8.1  ALBUMIN 4.6   No results for input(s): LIPASE, AMYLASE in the last 168 hours. No results for input(s): AMMONIA in the last 168 hours. CBC:  Recent Labs Lab 10/20/14 1234  WBC 22.5*  NEUTROABS 20.7*  HGB 13.5  HCT 41.7  MCV 96.5  PLT 264   Cardiac Enzymes: No results for input(s): CKTOTAL, CKMB, CKMBINDEX, TROPONINI in the last 168 hours. BNP: Invalid input(s): POCBNP CBG: No results for input(s): GLUCAP in the last 168 hours.  If 7PM-7AM, please contact night-coverage [email or web address] Password [email or web address] 10/20/2014, 4:03 PM

## 2014-10-20 NOTE — ED Notes (Signed)
Pt transported by [facility] from home. Pt had grand mal seizure at 0500 today, and vomited immediately after seizure.  Pt has home health nurse that visits daily and was found to have oral temp of 101.  Pt was given Tylenol 1000mg  at 1047 today through G-tube.  Pt has foley in place draining cloudy urine with sediment.  Pt has audible rhonchi.  Patient has questionable focal seizure while being moved to stretcher.  Pt is minimally responsive to pain.

## 2014-10-21 ENCOUNTER — Inpatient Hospital Stay (HOSPITAL_COMMUNITY): Payer: Commercial Managed Care - HMO

## 2014-10-21 ENCOUNTER — Encounter (HOSPITAL_COMMUNITY): Payer: Self-pay | Admitting: Internal Medicine

## 2014-10-21 DIAGNOSIS — T883XXA Malignant hyperthermia due to anesthesia, initial encounter: Secondary | ICD-10-CM

## 2014-10-21 DIAGNOSIS — N390 Urinary tract infection, site not specified: Secondary | ICD-10-CM

## 2014-10-21 DIAGNOSIS — T83511D Infection and inflammatory reaction due to indwelling urethral catheter, subsequent encounter: Secondary | ICD-10-CM

## 2014-10-21 DIAGNOSIS — G8250 Quadriplegia, unspecified: Secondary | ICD-10-CM

## 2014-10-21 DIAGNOSIS — E43 Unspecified severe protein-calorie malnutrition: Secondary | ICD-10-CM | POA: Diagnosis present

## 2014-10-21 DIAGNOSIS — G35 Multiple sclerosis: Secondary | ICD-10-CM

## 2014-10-21 DIAGNOSIS — G40909 Epilepsy, unspecified, not intractable, without status epilepticus: Secondary | ICD-10-CM

## 2014-10-21 DIAGNOSIS — J9601 Acute respiratory failure with hypoxia: Secondary | ICD-10-CM

## 2014-10-21 HISTORY — DX: Malignant hyperthermia due to anesthesia, initial encounter: T88.3XXA

## 2014-10-21 LAB — BLOOD GAS, ARTERIAL
ACID-BASE DEFICIT: 0.4 mmol/L (ref 0.0–2.0)
Acid-base deficit: 2.8 mmol/L — ABNORMAL HIGH (ref 0.0–2.0)
BICARBONATE: 25.5 meq/L — AB (ref 20.0–24.0)
BICARBONATE: 22.1 meq/L (ref 20.0–24.0)
DRAWN BY: 331471
Drawn by: 331471
FIO2: 1
FIO2: 1
LHR: 20 {breaths}/min
O2 Saturation: 99.2 %
O2 Saturation: 100 %
PATIENT TEMPERATURE: 39.4
PCO2 ART: 74.6 mmHg — AB (ref 35.0–45.0)
PEEP/CPAP: 8 cmH2O
PH ART: 7.18 — AB (ref 7.350–7.450)
PO2 ART: 200 mmHg — AB (ref 80.0–100.0)
PO2 ART: 388 mmHg — AB (ref 80.0–100.0)
Patient temperature: 39.9
TCO2: 24.2 mmol/L (ref 0–100)
TCO2: 19.7 mmol/L (ref 0–100)
VT: 550 mL
pCO2 arterial: 34.9 mmHg — ABNORMAL LOW (ref 35.0–45.0)
pH, Arterial: 7.43 (ref 7.350–7.450)

## 2014-10-21 LAB — BASIC METABOLIC PANEL
ANION GAP: 8 (ref 5–15)
BUN: 10 mg/dL (ref 6–20)
CALCIUM: 8.8 mg/dL — AB (ref 8.9–10.3)
CO2: 28 mmol/L (ref 22–32)
Chloride: 103 mmol/L (ref 101–111)
Creatinine, Ser: 0.61 mg/dL (ref 0.61–1.24)
Glucose, Bld: 92 mg/dL (ref 65–99)
POTASSIUM: 3.7 mmol/L (ref 3.5–5.1)
SODIUM: 139 mmol/L (ref 135–145)

## 2014-10-21 LAB — INFLUENZA PANEL BY PCR (TYPE A & B)
H1N1 flu by pcr: NOT DETECTED
Influenza A By PCR: NEGATIVE
Influenza B By PCR: NEGATIVE

## 2014-10-21 LAB — LACTIC ACID, PLASMA
Lactic Acid, Venous: 1.3 mmol/L (ref 0.5–2.0)
Lactic Acid, Venous: 2.4 mmol/L (ref 0.5–2.0)
Lactic Acid, Venous: 0.8 mmol/L (ref 0.5–2.0)
Lactic Acid, Venous: 2.6 mmol/L (ref 0.5–2.0)

## 2014-10-21 LAB — GLUCOSE, CAPILLARY: GLUCOSE-CAPILLARY: 100 mg/dL — AB (ref 65–99)

## 2014-10-21 LAB — TSH: TSH: 0.599 u[IU]/mL (ref 0.350–4.500)

## 2014-10-21 LAB — STREP PNEUMONIAE URINARY ANTIGEN: Strep Pneumo Urinary Antigen: NEGATIVE

## 2014-10-21 LAB — MRSA PCR SCREENING: MRSA BY PCR: POSITIVE — AB

## 2014-10-21 MED ORDER — FENTANYL CITRATE (PF) 100 MCG/2ML IJ SOLN
100.0000 ug | INTRAMUSCULAR | Status: DC | PRN
  Administered 2014-10-21: 100 ug via INTRAVENOUS

## 2014-10-21 MED ORDER — FENTANYL CITRATE (PF) 100 MCG/2ML IJ SOLN
100.0000 ug | INTRAMUSCULAR | Status: DC | PRN
  Administered 2014-10-21 – 2014-10-22 (×5): 100 ug via INTRAVENOUS
  Filled 2014-10-21 (×4): qty 2

## 2014-10-21 MED ORDER — LEVALBUTEROL HCL 0.63 MG/3ML IN NEBU
0.6300 mg | INHALATION_SOLUTION | Freq: Four times a day (QID) | RESPIRATORY_TRACT | Status: DC
  Administered 2014-10-21 – 2014-10-31 (×39): 0.63 mg via RESPIRATORY_TRACT
  Filled 2014-10-21 (×39): qty 3

## 2014-10-21 MED ORDER — MIDAZOLAM HCL 2 MG/2ML IJ SOLN
INTRAMUSCULAR | Status: AC
  Administered 2014-10-21: 2 mg
  Filled 2014-10-21: qty 4

## 2014-10-21 MED ORDER — IPRATROPIUM-ALBUTEROL 0.5-2.5 (3) MG/3ML IN SOLN
3.0000 mL | RESPIRATORY_TRACT | Status: DC
  Administered 2014-10-21 (×2): 3 mL via RESPIRATORY_TRACT
  Filled 2014-10-21 (×2): qty 3

## 2014-10-21 MED ORDER — SUCCINYLCHOLINE CHLORIDE 20 MG/ML IJ SOLN
INTRAMUSCULAR | Status: AC
  Administered 2014-10-21: 11:00:00
  Filled 2014-10-21: qty 1

## 2014-10-21 MED ORDER — FENTANYL CITRATE (PF) 100 MCG/2ML IJ SOLN
INTRAMUSCULAR | Status: AC
  Administered 2014-10-21: 100 ug
  Filled 2014-10-21: qty 4

## 2014-10-21 MED ORDER — CHLORHEXIDINE GLUCONATE 0.12% ORAL RINSE (MEDLINE KIT)
15.0000 mL | Freq: Two times a day (BID) | OROMUCOSAL | Status: DC
  Administered 2014-10-22 – 2014-10-27 (×12): 15 mL via OROMUCOSAL

## 2014-10-21 MED ORDER — CETYLPYRIDINIUM CHLORIDE 0.05 % MT LIQD
7.0000 mL | Freq: Two times a day (BID) | OROMUCOSAL | Status: DC
  Administered 2014-10-21 – 2014-10-27 (×13): 7 mL via OROMUCOSAL

## 2014-10-21 MED ORDER — PIPERACILLIN-TAZOBACTAM 3.375 G IVPB
3.3750 g | Freq: Three times a day (TID) | INTRAVENOUS | Status: DC
  Administered 2014-10-21 – 2014-10-26 (×16): 3.375 g via INTRAVENOUS
  Filled 2014-10-21 (×13): qty 50

## 2014-10-21 MED ORDER — DIAZEPAM 2 MG PO TABS
2.0000 mg | ORAL_TABLET | Freq: Once | ORAL | Status: AC
Start: 2014-10-21 — End: 2014-10-21
  Administered 2014-10-21: 2 mg via ORAL
  Filled 2014-10-21: qty 1

## 2014-10-21 MED ORDER — ROCURONIUM BROMIDE 50 MG/5ML IV SOLN
INTRAVENOUS | Status: AC
  Administered 2014-10-21: 50 mg
  Filled 2014-10-21: qty 2

## 2014-10-21 MED ORDER — CHLORHEXIDINE GLUCONATE CLOTH 2 % EX PADS
6.0000 | MEDICATED_PAD | Freq: Every day | CUTANEOUS | Status: DC
  Administered 2014-10-21: 6 via TOPICAL

## 2014-10-21 MED ORDER — ETOMIDATE 2 MG/ML IV SOLN
INTRAVENOUS | Status: AC
  Administered 2014-10-21: 20 mg
  Filled 2014-10-21: qty 20

## 2014-10-21 MED ORDER — DIAZEPAM 1 MG/ML PO SOLN: 2.0000 mg | Freq: Once | ORAL | Status: DC

## 2014-10-21 MED ORDER — MIDAZOLAM HCL 2 MG/2ML IJ SOLN
2.0000 mg | INTRAMUSCULAR | Status: DC | PRN
  Administered 2014-10-21: 2 mg via INTRAVENOUS
  Filled 2014-10-21: qty 2

## 2014-10-21 MED ORDER — LIDOCAINE HCL (CARDIAC) 20 MG/ML IV SOLN
INTRAVENOUS | Status: AC
  Administered 2014-10-21: 11:00:00
  Filled 2014-10-21: qty 5

## 2014-10-21 MED ORDER — ANTISEPTIC ORAL RINSE SOLUTION (CORINZ)
7.0000 mL | Freq: Four times a day (QID) | OROMUCOSAL | Status: DC
  Administered 2014-10-22 – 2014-10-27 (×21): 7 mL via OROMUCOSAL

## 2014-10-21 MED ORDER — MIDAZOLAM HCL 2 MG/2ML IJ SOLN
2.0000 mg | INTRAMUSCULAR | Status: DC | PRN
  Administered 2014-10-21 – 2014-10-22 (×4): 2 mg via INTRAVENOUS
  Filled 2014-10-21 (×4): qty 2

## 2014-10-21 MED ORDER — MUPIROCIN 2 % EX OINT
1.0000 "application " | TOPICAL_OINTMENT | Freq: Two times a day (BID) | CUTANEOUS | Status: AC
  Administered 2014-10-21 – 2014-10-25 (×10): 1 via NASAL
  Filled 2014-10-21 (×3): qty 22

## 2014-10-21 NOTE — Progress Notes (Signed)
Lactic acid 2.6 called to elink.

## 2014-10-21 NOTE — Care Management Note (Signed)
Case Management Note  Patient Details  Name: Terry Palmer MRN: [id number] Date of Birth: 15-Sep-1975  Subjective/Objective:       Sepsis, hypoxia high risk for requiring intubation             Action/Plan:Date:  Oct. 13, 2016 U.R. performed for needs and level of care. Will continue to follow for Case Management needs.  Velva Harman, RN, BSN, [place]   [phone]   Expected Discharge Date:                  Expected Discharge Plan:  Home/Self Care  In-House Referral:  NA  Discharge planning Services  CM Consult  Post Acute Care Choice:  NA Choice offered to:  NA  DME Arranged:  N/A DME Agency:  NA  HH Arranged:  NA HH Agency:  NA  Status of Service:  In process, will continue to follow  Medicare Important Message Given:    Date Medicare IM Given:    Medicare IM give by:    Date Additional Medicare IM Given:    Additional Medicare Important Message give by:     If discussed at [place] of Stay Meetings, dates discussed:    Additional Comments:  Leeroy Cha, RN 10/21/2014, 10:27 AM

## 2014-10-21 NOTE — Progress Notes (Signed)
Rt placed a Nasopharyngeal Airway in pt. Pt had no compactions to therapy. Pt had small clear/white sputum.

## 2014-10-21 NOTE — Consult Note (Signed)
PULMONARY / CRITICAL CARE MEDICINE   Name: Terry Palmer MRN: [id number] DOB: May 18, 1975    ADMISSION DATE:  10/20/2014 CONSULTATION DATE: 10/13  REFERRING MD :Charlies Silvers  CHIEF COMPLAINT:  Febrile  INITIAL PRESENTATION: 39 yo w MS and significant debilitation including quadroplegia, dysphagia. Admitted with suspected UTI / suprapubic cath infxn. Developed resp distress and hypoxemia 10/13 w inability to manage secretions. At risk for ETT. PCCM consulted to evaluate 10/13  STUDIES:    SIGNIFICANT EVENTS: 10/12 admitted to hospital 1013 hypoxic and poor airway compliance   HISTORY OF PRESENT ILLNESS:   39 yo male diagnosed with MS 12 years ago and has had a continuous decline in health status. He is now quadriparetic , requires tube feeds but can communicate non verbally.  He has chronic indwelling foley catheter and presents with fever, dirty urine, clear chest x ray, tachycardia and febrile. Treated with Vanc/Ceftazadime but 10/13 he is hypoxic(despite NRB) and unable to clear secretions.  NTS's for copious secretions with improved O2 saturation and improved airway compliance. He may need intubation but will try to avoid at this time. He has extensive contractures. G tube and suprapubic catheter(changed 1 week ago).  PCCM asked to help in his care. 10/13 changed ceftaz to zosyn for aspiration cover.  PAST MEDICAL HISTORY :   has a past medical history of MS (multiple sclerosis) ([place]); Quadriparesis (muscle weakness) ([place]) (03/12/2011); Childhood asthma; Depression; Neuromuscular disorder ([place]); Recurrent UTI; Dysphagia; Bladder calculi; Neurogenic bladder; Shortness of breath; Recurrent upper respiratory infection (URI); Normocytic anemia (05/28/2011); and Aspiration pneumonia ([place]) (01/20/2013).  has past surgical history that includes Lumbar puncture (10/12/2002) and Gastrostomy (04/16/2011). Prior to Admission medications   Medication Sig Start Date End Date Taking? Authorizing Provider   acetaminophen (TYLENOL) 500 MG tablet 1,000 mg by PEG Tube route 2 (two) times daily.   Yes Historical Provider, MD  ascorbic acid (VITAMIN C) 500 MG/5ML syrup Place 5 mLs (500 mg total) into feeding tube 2 (two) times daily. 01/24/13  Yes Bonnielee Haff, MD  baclofen (LIORESAL) 20 MG tablet Place 1 tablet (20 mg total) into feeding tube 3 (three) times daily. 01/24/13  Yes Bonnielee Haff, MD  bisacodyl (DULCOLAX) 10 MG suppository Place 10 mg rectally as needed for moderate constipation.   Yes Historical Provider, MD  buPROPion (WELLBUTRIN) 75 MG tablet Place 1 tablet (75 mg total) into feeding tube 3 (three) times daily. 01/24/13  Yes Bonnielee Haff, MD  cholecalciferol (VITAMIN D) 1000 UNITS tablet Take 1 tablet (1,000 Units total) by mouth 2 (two) times daily. Patient taking differently: Take 5,000 Units by mouth daily.  01/24/13  Yes Bonnielee Haff, MD  Cranberry Juice Powder 425 MG CAPS 1 capsule (425 mg total) by PEG Tube route 2 (two) times daily. 01/24/13  Yes Bonnielee Haff, MD  dantrolene (DANTRIUM) 50 MG capsule Take 200 mg by mouth at bedtime.    Yes Historical Provider, MD  Diaper Rash Products (DESITIN EX) Apply 1 application topically as needed (may apply to minor skin breakdown until healed).   Yes Historical Provider, MD  diazepam (VALIUM) 5 MG tablet Take 5 mg by mouth at bedtime.   Yes Historical Provider, MD  fexofenadine (ALLEGRA) 180 MG tablet Place 180 mg into feeding tube daily.   Yes Historical Provider, MD  FLUCONAZOLE PO Give 1 tablet by tube daily as needed (after an antibiotic course completed).   Yes Historical Provider, MD  Garlic Oil (ODORLESS GARLIC) 814 MG TABS 4,818 mg by PEG Tube route daily.  Yes Historical Provider, MD  HYDROcodone-acetaminophen (NORCO) 7.5-325 MG per tablet Place 1 tablet into feeding tube every 6 (six) hours as needed (pain).   Yes Historical Provider, MD  ipratropium-albuterol (DUONEB) 0.5-2.5 (3) MG/3ML SOLN Take 3 mLs by nebulization 2 (two)  times daily.   Yes Historical Provider, MD  Lactobacillus (ACIDOPHILUS PO) Place 1 tablet into feeding tube as directed.    Yes Historical Provider, MD  levETIRAcetam (KEPPRA) 100 MG/ML solution Place 1,000 mg into feeding tube 2 (two) times daily. 01/24/13  Yes Bonnielee Haff, MD  magnesium hydroxide (MILK OF MAGNESIA) 400 MG/5ML suspension Take 30 mLs by mouth every 12 (twelve) hours as needed for mild constipation.   Yes Historical Provider, MD  Multiple Vitamin (MULTIVITAMIN WITH MINERALS) TABS tablet Place 1 tablet into feeding tube daily. 01/24/13  Yes Bonnielee Haff, MD  NALTREXONE HCL PO Give 4.5 mg by tube at bedtime.   Yes Historical Provider, MD  neomycin-bacitracin-polymyxin (NEOSPORIN) [phone] ointment Apply 1 application topically daily as needed (for toes).   Yes Historical Provider, MD  Nutritional Supplements (FEEDING SUPPLEMENT, OSMOLITE 1.5 CAL,) LIQD Place 1,000 mLs into feeding tube continuous. At 53ml/HR Patient taking differently: Place 1,000 mLs into feeding tube continuous. At 63ml/HR 01/24/13  Yes Bonnielee Haff, MD  nystatin (MYCOSTATIN/NYSTOP) 100000 UNIT/GM POWD Apply 1 g topically 3 (three) times daily. Patient taking differently: Apply 1 g topically daily. Applies to groin 01/24/13  Yes Bonnielee Haff, MD  omeprazole (PRILOSEC) 2 mg/mL SUSP Place 20 mLs (40 mg total) into feeding tube daily. 01/24/13  Yes Bonnielee Haff, MD  polyethylene glycol (MIRALAX / GLYCOLAX) packet Place 17 g into feeding tube daily. 01/24/13  Yes Bonnielee Haff, MD  polyvinyl alcohol (LIQUIFILM TEARS) 1.4 % ophthalmic solution Place 1 drop into both eyes 3 (three) times daily. Patient taking differently: Place 1 drop into both eyes 2 (two) times daily.  01/24/13  Yes Bonnielee Haff, MD  senna (SENOKOT) 8.6 MG TABS tablet Take 2 tablets by mouth daily.   Yes Historical Provider, MD  sodium phosphate (FLEET) enema Place 1 enema rectally once as needed (for constipation). follow package directions    Yes Historical Provider, MD  Water For Irrigation, Sterile (FREE WATER) SOLN Place 240 mLs into feeding tube every 4 (four) hours. 01/24/13  Yes Bonnielee Haff, MD  AFLURIA PRESERVATIVE FREE 0.5 ML SUSY Inject 0.5 mLs into the skin once. For flu season 10/14/14   Historical Provider, MD  Amino Acids-Protein Hydrolys (FEEDING SUPPLEMENT, PRO-STAT SUGAR FREE 64,) LIQD Place 30 mLs into feeding tube 3 (three) times daily. Patient not taking: Reported on 10/20/2014 01/24/13   Bonnielee Haff, MD  fosfomycin (MONUROL) 3 G PACK Place 3 g into feeding tube every 3 (three) days. ON 6/28 ONCE Patient not taking: Reported on 10/20/2014 07/03/13   Delfina Redwood, MD   No Known Allergies  FAMILY HISTORY:  indicated that his mother is alive. He indicated that his father is alive.  SOCIAL HISTORY:  reports that he quit smoking about 15 years ago. His smoking use included Cigarettes. He has quit using smokeless tobacco. He reports that he does not drink alcohol.  REVIEW OF SYSTEMS:  Na  SUBJECTIVE:   VITAL SIGNS: Temp:  [98.7 F (37.1 C)-105.2 F (40.7 C)] 103.9 F (39.9 C) (10/13 0800) Pulse Rate:  [93-127] 119 (10/13 0645) Resp:  [10-25] 21 (10/13 0645) BP: (132-161)/(63-91) 161/84 mmHg (10/13 0600) SpO2:  [86 %-100 %] 97 % (10/13 0646) Weight:  [165 lb (74.844 kg)] 165  lb (74.844 kg) (10/12 1621) HEMODYNAMICS:   VENTILATOR SETTINGS:   INTAKE / OUTPUT:  Intake/Output Summary (Last 24 hours) at 10/21/14 0842 Last data filed at 10/21/14 0600  Gross per 24 hour  Intake 5571.67 ml  Output    700 ml  Net 4871.67 ml    PHYSICAL EXAMINATION: General:  Contracted ill appearing male in resp distress. Neuro:  MS x 12 years, contracted, grunting but non verbal HEENT:  PERL 4 mm, copious oral secretions Cardiovascular:  HSR RRR Lungs:  Coarse rhonchi  Abdomen: + bd, PEG and suprapubic cath Musculoskeletal:  Horribly contracted Skin: hot, sacral decubitus   LABS:  CBC  Recent Labs Lab  10/20/14 1234 10/20/14 2210  WBC 22.5* 18.9*  HGB 13.5 12.6*  HCT 41.7 39.2  PLT 264 231   Coag's  Recent Labs Lab 10/20/14 2210  APTT 34  INR 1.16   BMET  Recent Labs Lab 10/20/14 1234 10/20/14 1845 10/20/14 2223  NA 137 136 139  K 4.1 4.8 3.7  CL 101 104 103  CO2 28 24 28   BUN 13 11 10   CREATININE 0.51* 0.57* 0.61  GLUCOSE 110* 95 92   Electrolytes  Recent Labs Lab 10/20/14 1234 10/20/14 1845 10/20/14 2210 10/20/14 2223  CALCIUM 9.2 8.5*  --  8.8*  MG  --   --  1.9  --   PHOS  --   --  1.8*  --    Sepsis Markers  Recent Labs Lab 10/20/14 2210 10/21/14 0115 10/21/14 0345  LATICACIDVEN 2.3* 1.3 2.4*  PROCALCITON <0.10  --   --    ABG No results for input(s): PHART, PCO2ART, PO2ART in the last 168 hours. Liver Enzymes  Recent Labs Lab 10/20/14 1234 10/20/14 1845  AST 25 30  ALT 22 17  ALKPHOS 101 94  BILITOT 0.4 0.6  ALBUMIN 4.6 4.1   Cardiac Enzymes No results for input(s): TROPONINI, PROBNP in the last 168 hours. Glucose  Recent Labs Lab 10/21/14 0735  GLUCAP 100*    Imaging Dg Chest 2 View  10/20/2014  CLINICAL DATA:  Fever, seizure. EXAM: CHEST  2 VIEW COMPARISON:  June 29, 2013. FINDINGS: The heart size and mediastinal contours are within normal limits. Both lungs are clear. No pneumothorax or pleural effusion is noted. The visualized skeletal structures are unremarkable. IMPRESSION: No active cardiopulmonary disease. Electronically Signed   By: Marijo Conception, M.D.   On: 10/20/2014 13:24   Dg Chest Port 1 View  10/21/2014  CLINICAL DATA:  39 year old male with fever and abnormal pulmonary auscultation. Initial encounter. EXAM: PORTABLE CHEST 1 VIEW COMPARISON:  10/20/2014 and earlier. FINDINGS: Portable AP semi upright view at 2355 hours. Stable somewhat low lung volumes. Normal cardiac size and mediastinal contours. No pneumothorax, pleural effusion or confluent pulmonary opacity. Increased pulmonary interstitial bilaterally.  No overt edema. Continued gaseous distention of colon in the visualized upper abdomen. IMPRESSION: Interval increased bilateral interstitial markings. Consider viral/atypical pneumonia in this setting. Main differential consideration is pulmonary vascular congestion. Electronically Signed   By: Genevie Ann M.D.   On: 10/21/2014 00:13     ASSESSMENT / PLAN:  PULMONARY A: Hypoxia in setting of poor secretion clearance Clear cxr but he could be aspirating silently and radiographic lag prevents diagnosis P:   Agressive oral/nasal suctioning Nasal trumpet O2 as needed May need intubation for airway protection; complicated issue as ETT would likely lead ultimately to tracheostomy. Will need to broach this subject w family  CARDIOVASCULAR A: Tachycardia  P:  Treatment for fever Normotensive currently  RENAL Lab Results  Component Value Date   CREATININE 0.61 10/20/2014   CREATININE 0.57* 10/20/2014   CREATININE 0.51* 10/20/2014  A:   Suprapubic cath P:   Change catheter  GASTROINTESTINAL A:   Peg for TF P:   Continue TF as ordered. Careful with reflux / aspiration risk  HEMATOLOGIC A:   Chronic anemia P:  Follow cbc  INFECTIOUS A:   Presumed UTI P:   BCx2 10/13>> UC 10/13>> Resp (from suctioning) 10/13 >>  Abx: 10/12 vanc>> 10/12 ceftaz>>10/13 (dc and changed to Zosyn to cover possible aspiration) 10/13 zosyn>>   ENDOCRINE A:  No acute issue  P:    NEUROLOGIC A:  MS with Quadriparesis  Non verbal Metabolic encephalopathic  Szs disorder P:   RASS goal: 0 Minimize sedation to encourage secretion management Continue Keppra May need to adjust his baseline valium temporarily to avoid oversedation   FAMILY  - Updates: Extensive conversation with mother. Explained that if he required ETT then he will likely also ultimately require trach. She understands this - they had to consider and discuss this before when he was ill but he was always able to avoid. Pt  would want to be Full code, would want a chronic trach to manage secretions etc going forward. We will try to avoid intubation if possible but proceed if needed/.   - Inter-disciplinary family meet or Palliative Care meeting due by:  10/28/14    TODAY'S SUMMARY:  39 yo male diagnosed with MS 12 years ago and has had a continuous decline in health status. He is now quadriparetic , requires tube feeds but can communicate non verbally.  He has chronic indwelling foley catheter and presents with fever, dirty urine, clear chest x ray, tachycardia and febrile. Treated with Vanc/Ceftazadime but 10/13 he is hypoxic(despite NRB) and unable to clear secretions.  NTS's for copious secretions with improved O2 saturation and improved airway compliance. He may need intubation but will try to avoid at this time. He has extensive contractures. G tube and suprapubic catheter(changed 1 week ago).  PCCM asked to help in his care.  Richardson Landry Minor ACNP Maryanna Shape PCCM Pager [phone] till 3 pm If no answer page [phone] 10/21/2014, 8:54 AM   Attending Note:  I have examined patient, reviewed labs, studies and notes. I have discussed the case with S Minor, and I agree with the data and plans as amended above. PT with quadriplegia from [place], admitted with sepsis from probable UTI. Has evolved resp distress and respiratory acidosis in setting of the illness. On my eval he has a somewhat grunting resp pattern although not tachpyneic. Copious secretions were suctioned effectively. We will broaden abx to cover anaerobes, follow closely and push pum hygiene. Hopefully can avoid ETT but have confirmed with his mother that he would want this and tracheostomy in order to stabilize. Independent critical care time is 60 minutes.   Baltazar Apo, MD, PhD 10/21/2014, 9:36 AM [facility] [phone] or if no answer [phone]

## 2014-10-21 NOTE — Plan of Care (Signed)
Problem: Phase I Progression Outcomes Goal: Voiding-avoid urinary catheter unless indicated Outcome: Not Met (add Reason) Pt has suprapubic cath.

## 2014-10-21 NOTE — Progress Notes (Signed)
RN went to give pt 2200 meds around 2230, decided to check temperature again since pt looked flushed and it had been feverish. With oral temperature it was 98.7degrees F. RN decided to check rectal temperature too because it didn't seem correct, after rectal temperature it was 105.2 degrees F. NP on call was notified, rapid was called, charge nurse, and AC all involved. PT got 1000mg  of Tylenol Per Tube and two 1068mL Bolus of fluid and ice packs were applied under each arm and between legs. Report was called to nurse taking pt in Step-Down. Pt was moved to step down immediately after. Carmela Hurt, RN 10/21/2014

## 2014-10-21 NOTE — Progress Notes (Signed)
Pt hyperoxygenated prior to NT suction.  BBS rhonchi throughout.  Pt NT suctioned x1 for moderate to large thin clear/white secretions.  Pt tolerated poorly with increase in HR150s, spo2 60s.  Pt however recovered quickly, HR128, rr13, spo2 98% on 4lnc.  RN aware.

## 2014-10-21 NOTE — Progress Notes (Signed)
Initial Nutrition Assessment  INTERVENTION:   If pt expected to remain intubated and is medically able to start feedings: Recommend Vital 1.5 @ 20 ml/hr via PEG and increase by 10 ml every 4 hours to goal rate of 60 ml/hr.   30 ml Prostat daily.    Tube feeding regimen provides 2260 kcal (97% of needs), 112 grams of protein, and 1100 ml of H2O.   RD to continue to monitor  NUTRITION DIAGNOSIS:   Inadequate oral intake related to inability to eat as evidenced by NPO status.  GOAL:   Patient will meet greater than or equal to 90% of their needs  MONITOR:   Vent status, Labs, Weight trends, Skin, I & O's  REASON FOR ASSESSMENT:   Consult, Ventilator Assessment of nutrition requirement/status  ASSESSMENT:   39 year old male with past medical history of multiple sclerosis, neurogenic bladder with chronic indwelling foley catheter, peg tube for nutritional support. patient presented from home with reports of ongoing fevers and worsening upper respiratory tract congestion.   Pt was being intubated when RD was coming to see patient. Unable to perform nutrition focused physical exam and obtain history.  Pt with PEG tube, was receiving Osmolite 1.5 at home PTA. Unsure about goal rate from chart. RD has provided TF recommendations above for while patient is intubated.  Patient is currently intubated on ventilator support MV: 8.5 L/min Temp (24hrs), Avg:102.6 F (39.2 C), Min:98.7 F (37.1 C), Max:105.2 F (40.7 C)  Propofol: none  Lab reviewed.  Diet Order:     Skin:  Wound (see comment) (Stage II knee and sacral ulcers)  Last BM:  10/12  Height:   Ht Readings from Last 1 Encounters:  10/20/14 6' 3.98" (1.93 m)    Weight:   Wt Readings from Last 1 Encounters:  10/20/14 165 lb (74.844 kg)    Ideal Body Weight:  91.8 kg  BMI:  Body mass index is 20.09 kg/(m^2).  Estimated Nutritional Needs:   Kcal:  2322  Protein:  110-120g  Fluid:   2.3L/day  EDUCATION NEEDS:   No education needs identified at this time  Clayton Bibles, MS, RD, LDN Pager: [phone] After Hours Pager: [phone]

## 2014-10-21 NOTE — Procedures (Signed)
Intubation Procedure Note LI FRAGOSO [id number] 1975/01/16  Procedure: Intubation Indications: Respiratory insufficiency  Procedure Details Consent: Risks of procedure as well as the alternatives and risks of each were explained to the (patient/caregiver).  Consent for procedure obtained. Time Out: Verified patient identification, verified procedure, site/side was marked, verified correct patient position, special equipment/implants available, medications/allergies/relevent history reviewed, required imaging and test results available.  Performed  MAC and 3 Medications:  Fentanyl 100 mcg Etomidate 20 mg Versed 2 mg NMB    Evaluation Hemodynamic Status: BP stable throughout; O2 sats: stable throughout Patient's Current Condition: stable Complications: No apparent complications Patient did tolerate procedure well. Chest X-ray ordered to verify placement.  CXR: pending.   Richardson Landry Minor ACNP Maryanna Shape PCCM Pager [phone] till 3 pm If no answer page [phone] 10/21/2014, 10:54 AM  Baltazar Apo, MD, PhD 10/21/2014, 12:20 PM [facility] [phone] or if no answer [phone]

## 2014-10-21 NOTE — Progress Notes (Signed)
Patient ID: Terry Palmer, male   DOB: 19-Feb-1975, 39 y.o.   MRN: [id number] [facility] PROGRESS NOTE  Terry Palmer PFX:[id number] DOB: 29-Aug-1975 DOA: 10/20/2014 PCP: Terry Poll, MD  Brief narrative:    39 year old male with past medical history of multiple sclerosis, neurogenic bladder with chronic indwelling foley catheter, peg tube for nutritional support. patient presented from home with reports of ongoing fevers and worsening upper respiratory tract congestion.   In ED, patient was hypoxic with oxygen saturation of 88% on room air. Overnight, patient spikes fever as high as 105.2 Fahrenheit's. T max was 103 F, HR 122, RR 20. Blood work showed WBC count of 22.5. CXR did not show acute cardiopulmonary findings. UA showed large leukocytes and many bacteria. Sepsis criteria met so he was started on ceftazidime and vanco.  Overnight, pt spiked fever as high as 105.2 F. This morning patient requires 90% NRM and his O2 saturation is 88%. CCM consulted should this patietn require intubation. ABG and CXR ordered.   Assessment/Plan:    Principal Problem: Sepsis secondary to UTI ([place]) / UTI (urinary tract infection) due to urinary indwelling Foley catheter (HCC) / Neurogenic bladder / Leukocytosis - Sepsis criteria met on admission with fever of 103 F, HR 122, RR 20, hypoxia of 88%, leukocytosis of 22.5.  - Source of infections suspected UTI based on UA on admission which showed large leukocytes and many bacteria. In addition, patient has chronic indwelling foley catheter and nerogenic bladder. Also possible aspiration pneumonia in differential diagnoses.  - Sepsis work up initiated on admission. Lactic acid was 2.53 and procalcitonin level was less than 0.1. - Started vanco and ceftazidime on admission.  - Blood cultures pending   Active Problems: Acute respiratory failure with hypoxia (HCC) / Aspiration pneumonitis - Hypoxia likely due to possible aspiration considering  patient is on tube feeds and he presented with hypoxia. - His hypoxia has worsened in past 24 hours and now he is saturating 88% with 90% NRM.  - Obtain ABG and CXR this morning.  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  Family Communication:  plan of care discussed with the patient's mother at the bedside Disposition Plan: Remains in step down unit because of hypoxia, requirement for Ventimask.   IV access:  Peripheral IV  Procedures and diagnostic studies:    Dg Chest 2 View 10/20/2014  No active cardiopulmonary disease. Electronically Signed   By: Terry Palmer, M.D.   On: 10/20/2014 13:24   Dg Chest Port 1 View 10/21/2014   Interval increased bilateral interstitial markings. Consider viral/atypical pneumonia in this setting. Main differential consideration is pulmonary vascular congestion. Electronically Signed   By: Terry Palmer M.D.   On: 10/21/2014 00:13    Medical Consultants:  Critical care  Other Consultants:  None  IAnti-Infectives:   Ceftazidime 10/20/2014 --> Vancomycin 10/20/2014 -->  Terry Lenz, MD  [facility] Pager [phone]  Time spent in minutes: 25 minutes  If 7PM-7AM, please contact night-coverage [email or web address] Password [email or web address] 10/21/2014, 8:58 AM   LOS: 1 day    HPI/Subjective: No acute overnight events. patient requires  Ventimask.   Objective: Filed Vitals:   10/21/14 7253 10/21/14 0645 10/21/14 0646 10/21/14 0800  BP:      Pulse:  119    Temp:    103.9 F (39.9 C)  TempSrc:    Rectal  Resp:  21    Height:      Weight:      SpO2: 96% 100% 97%     Intake/Output Summary (Last 24 hours) at 10/21/14 0858 Last data filed at 10/21/14 0600  Gross per 24 hour  Intake 5571.67 ml  Output    700 ml  Net 4871.67 ml    Exam:   General:  Pt is on ventimask, no distress  Cardiovascular: tachycardic, appreciate S1-S2  Respiratory:  diminished coarse breath sounds, some wheezing in upper/mid lung lobes  Abdomen: Soft, non tender, non distended, bowel sounds present  Extremities: No edema, pulses DP and PT palpable bilaterally  Neuro: contracted extremities  Data Reviewed: Basic Metabolic Panel:  Recent Labs Lab 10/20/14 1234 10/20/14 1845 10/20/14 2210 10/20/14 2223  NA 137 136  --  139  K 4.1 4.8  --  3.7  CL 101 104  --  103  CO2 28 24  --  28  GLUCOSE 110* 95  --  92  BUN 13 11  --  10  CREATININE 0.51* 0.57*  --  0.61  CALCIUM 9.2 8.5*  --  8.8*  MG  --   --  1.9  --   PHOS  --   --  1.8*  --    Liver Function Tests:  Recent Labs Lab 10/20/14 1234 10/20/14 1845  AST 25 30  ALT 22 17  ALKPHOS 101 94  BILITOT 0.4 0.6  PROT 8.1 7.6  ALBUMIN 4.6 4.1   No results for input(s): LIPASE, AMYLASE in the last 168 hours. No results for input(s): AMMONIA in the last 168 hours. CBC:  Recent Labs Lab 10/20/14 1234 10/20/14 2210  WBC 22.5* 18.9*  NEUTROABS 20.7* 16.7*  HGB 13.5 12.6*  HCT 41.7 39.2  MCV 96.5 95.8  PLT 264 231   Cardiac Enzymes: No results for input(s): CKTOTAL, CKMB, CKMBINDEX, TROPONINI in the last 168 hours. BNP: Invalid input(s): POCBNP CBG:  Recent Labs Lab 10/21/14 0735  GLUCAP 100*    Recent Results (from the past 240 hour(s))  MRSA PCR Screening     Status: Abnormal   Collection Time: 10/21/14  1:30 AM  Result Value Ref Range Status    MRSA by PCR POSITIVE (A) NEGATIVE Final    Comment:        The GeneXpert MRSA Assay (FDA approved for NASAL specimens only), is one component of a comprehensive MRSA colonization surveillance program. It is not intended to diagnose MRSA infection nor to guide or monitor treatment for MRSA infections. RESULT CALLED TO, READ BACK BY AND VERIFIED WITHBethann Humble RN [id number] 10/21/14 A NAVARRO      Scheduled Meds: . acidophilus  1 capsule Oral Daily  . antiseptic oral rinse  7 mL Mouth Rinse BID  . ascorbic acid  500 mg Per Tube BID  . baclofen  20 mg Per Tube TID  . buPROPion  75 mg Per Tube TID  . cefTAZidime (FORTAZ)  IV  1 g Intravenous Q8H  . Chlorhexidine Gluconate Cloth  6 each Topical Q0600  . cholecalciferol  5,000 Units Oral Daily  . dantrolene  200 mg Oral QHS  . diazepam  5 mg Oral QHS  . free water  240 mL Per Tube Q4H  . ipratropium-albuterol  3 mL Nebulization Q4H  . levETIRAcetam  1,000 mg Per Tube BID  . loratadine  10 mg Oral Daily  . multivitamin with minerals  1 tablet Per Tube Daily  . mupirocin ointment  1 application Nasal BID  . Naltrexone HCl  4.5 mg Oral QHS  . nystatin  1 g Topical Daily  . pantoprazole sodium  40 mg Per Tube Daily  . polyethylene glycol  17 g Per Tube Daily  . polyvinyl alcohol  1 drop Both Eyes BID  . senna  2 tablet Oral Daily  . sodium chloride  3 mL Intravenous Q12H  . vancomycin  1,000 mg Intravenous Q12H   Continuous Infusions: . sodium chloride 125 mL/hr at 10/21/14 8341

## 2014-10-21 NOTE — Procedures (Signed)
Central Venous Catheter Insertion Procedure Note Terry Palmer [id number] [birth year]  Procedure: Insertion of Central Venous Catheter Indications: Assessment of intravascular volume, Drug and/or fluid administration and Frequent blood sampling  Procedure Details Consent: Risks of procedure as well as the alternatives and risks of each were explained to the (patient/caregiver).  Consent for procedure obtained. Time Out: Verified patient identification, verified procedure, site/side was marked, verified correct patient position, special equipment/implants available, medications/allergies/relevent history reviewed, required imaging and test results available.  Performed  Maximum sterile technique was used including antiseptics, cap, gloves, gown, hand hygiene, mask and sheet. Skin prep: Chlorhexidine; local anesthetic administered A antimicrobial bonded/coated triple lumen catheter was placed in the left internal jugular vein using the Seldinger technique. Ultrasound guidance used.Yes.   Catheter placed to 20 cm. Blood aspirated via all 3 ports and then flushed x 3. Line sutured x 2 and dressing applied.  Evaluation Blood flow good Complications: No apparent complications Patient did tolerate procedure well. Chest X-ray ordered to verify placement.  CXR: pending.  Richardson Landry Minor ACNP Maryanna Shape PCCM Pager [phone] till 3 pm If no answer page [phone] 10/21/2014, 10:55 AM   Baltazar Apo, MD, PhD 10/21/2014, 12:20 PM [facility] [phone] or if no answer [phone]

## 2014-10-21 NOTE — Consult Note (Signed)
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 Some hypergranulation noted at wound periphery, but the silver alginate (hydrofiber) will minimize moisture and assist in the resolution of the hypergranulation. I will provide orders for the placement of a therapeutic mattress when patient transfers to floor and will today provide a pressure redistribution boot for pressure injury prevention to the left heel. [facility] nursing team will not follow, but will remain available to this patient, the nursing and medical teams.  Please re-consult if needed. Thanks, Maudie Flakes, MSN, RN, [place], [place], [place] [phone])

## 2014-10-21 NOTE — Progress Notes (Signed)
ANTIBIOTIC CONSULT NOTE - Follow up  Pharmacy Consult for Vancomycin / Zosyn Indication: Sepsis  No Known Allergies  Patient Measurements: Height: 6' 3.98" (193 cm) Weight: 165 lb (74.844 kg) IBW/kg (Calculated) : 86.76  Vital Signs: Temp: 103.9 F (39.9 C) (10/13 0800) Temp Source: Rectal (10/13 0800) BP: 174/90 mmHg (10/13 0900) Pulse Rate: 127 (10/13 0900) Intake/Output from previous day: 10/12 0701 - 10/13 0700 In: 5571.7 [I.V.:1521.7; NG/GT:250; IV Piggyback:2300] Out: 700 [Urine:700]  Labs:  Recent Labs  10/20/14 1234 10/20/14 1845 10/20/14 2210 10/20/14 2223  WBC 22.5*  --  18.9*  --   HGB 13.5  --  12.6*  --   PLT 264  --  231  --   CREATININE 0.51* 0.57*  --  0.61   Estimated Creatinine Clearance: 129.9 mL/min (by C-G formula based on Cr of 0.61). No results for input(s): VANCOTROUGH, VANCOPEAK, VANCORANDOM, GENTTROUGH, GENTPEAK, GENTRANDOM, TOBRATROUGH, TOBRAPEAK, TOBRARND, AMIKACINPEAK, AMIKACINTROU, AMIKACIN in the last 72 hours.   Assessment: 68 yoM with hx seizures and debilitating MS with suprapubic catheter presents with grand mal seizure and UTI.  Pharmacy consulted to start vancomycin and ceftazidime initially, but today ceftazidime is changed to Zosyn for anaerobe coverage with possible aspiration.  Anti-infectives 10/12 >> Zosyn x1 10/12 >> Vancomycin  >> 10/12 >> Ceftazidime  >>  10/13 10/13 >> Zosyn >>   Vitals/Labs  Rectal temp with fever 105.2 overnight, transferred to ICU and given fluids, APAP, ice packs.  Tc 103.9  WBC remains elevated  SCr 0.6, CrCl >100 - overestimated with quadraparesis/bedbound state  LA 1.3 > 2.4  CXR (10/13) w/ viral/atypical pneumonia vs pulmonary vascular congestion.  Blood and urine cultures pending.  Influenza and strep pneumo negative.  Goal of Therapy:  Vancomycin trough level 15-20 mcg/ml  Appropriate abx dosing, eradication of infection.   Plan:   Zosyn 3.375g IV Q8H infused over 4hrs.    Continue Vancomycin 1g IV q12h.  Measure Vanc trough at steady state.  Follow up renal fxn, culture results, and clinical course.  Gretta Arab PharmD, BCPS Pager [phone] 10/21/2014 9:12 AM

## 2014-10-22 ENCOUNTER — Inpatient Hospital Stay (HOSPITAL_COMMUNITY): Payer: Commercial Managed Care - HMO

## 2014-10-22 DIAGNOSIS — A419 Sepsis, unspecified organism: Principal | ICD-10-CM

## 2014-10-22 DIAGNOSIS — J690 Pneumonitis due to inhalation of food and vomit: Secondary | ICD-10-CM

## 2014-10-22 LAB — CBC
HEMATOCRIT: 35.5 % — AB (ref 39.0–52.0)
Hemoglobin: 12.1 g/dL — ABNORMAL LOW (ref 13.0–17.0)
MCH: 30.7 pg (ref 26.0–34.0)
MCHC: 34.1 g/dL (ref 30.0–36.0)
MCV: 90.1 fL (ref 78.0–100.0)
PLATELETS: 172 10*3/uL (ref 150–400)
RBC: 3.94 MIL/uL — ABNORMAL LOW (ref 4.22–5.81)
RDW: 12.5 % (ref 11.5–15.5)
WBC: 19.5 10*3/uL — ABNORMAL HIGH (ref 4.0–10.5)

## 2014-10-22 LAB — BASIC METABOLIC PANEL
ANION GAP: 12 (ref 5–15)
Anion gap: 12 (ref 5–15)
BUN: 9 mg/dL (ref 6–20)
BUN: 8 mg/dL (ref 6–20)
CALCIUM: 8 mg/dL — AB (ref 8.9–10.3)
CALCIUM: 8 mg/dL — AB (ref 8.9–10.3)
CHLORIDE: 95 mmol/L — AB (ref 101–111)
CO2: 20 mmol/L — AB (ref 22–32)
CO2: 17 mmol/L — AB (ref 22–32)
CREATININE: 0.69 mg/dL (ref 0.61–1.24)
CREATININE: 0.63 mg/dL (ref 0.61–1.24)
Chloride: 96 mmol/L — ABNORMAL LOW (ref 101–111)
GFR calc Af Amer: >60 mL/min (ref >60)
GFR calc non Af Amer: >60 mL/min (ref >60)
GLUCOSE: 91 mg/dL (ref 65–99)
Glucose, Bld: 88 mg/dL (ref 65–99)
Potassium: 2.7 mmol/L — CL (ref 3.5–5.1)
Potassium: 2.9 mmol/L — ABNORMAL LOW (ref 3.5–5.1)
SODIUM: 124 mmol/L — AB (ref 135–145)
Sodium: 128 mmol/L — ABNORMAL LOW (ref 135–145)

## 2014-10-22 LAB — URINE CULTURE

## 2014-10-22 LAB — VANCOMYCIN, TROUGH: VANCOMYCIN TR: 5 ug/mL — AB (ref 10.0–20.0)

## 2014-10-22 LAB — GLUCOSE, CAPILLARY: GLUCOSE-CAPILLARY: 82 mg/dL (ref 65–99)

## 2014-10-22 MED ORDER — CHLORHEXIDINE GLUCONATE CLOTH 2 % EX PADS
6.0000 | MEDICATED_PAD | Freq: Every day | CUTANEOUS | Status: DC
  Administered 2014-10-22: 6 via TOPICAL

## 2014-10-22 MED ORDER — POTASSIUM CHLORIDE 10 MEQ/50ML IV SOLN
10.0000 meq | INTRAVENOUS | Status: AC
  Administered 2014-10-22 – 2014-10-23 (×4): 10 meq via INTRAVENOUS
  Filled 2014-10-22 (×4): qty 50

## 2014-10-22 MED ORDER — SODIUM CHLORIDE 0.9 % IV SOLN
25.0000 ug/h | INTRAVENOUS | Status: DC
  Administered 2014-10-22: 50 ug/h via INTRAVENOUS
  Administered 2014-10-24: 100 ug/h via INTRAVENOUS
  Administered 2014-10-25: 125 ug/h via INTRAVENOUS
  Administered 2014-10-25: 100 ug/h via INTRAVENOUS
  Filled 2014-10-22 (×4): qty 50

## 2014-10-22 MED ORDER — FENTANYL CITRATE (PF) 100 MCG/2ML IJ SOLN
50.0000 ug | Freq: Once | INTRAMUSCULAR | Status: AC
  Administered 2014-10-22: 50 ug via INTRAVENOUS

## 2014-10-22 MED ORDER — METOPROLOL TARTRATE 1 MG/ML IV SOLN
2.5000 mg | INTRAVENOUS | Status: DC | PRN
Start: 2014-10-22 — End: 2014-11-02
  Administered 2014-10-22: 2.5 mg via INTRAVENOUS
  Administered 2014-10-22: 5 mg via INTRAVENOUS
  Administered 2014-10-23: 2.5 mg via INTRAVENOUS
  Filled 2014-10-22 (×3): qty 5

## 2014-10-22 MED ORDER — VANCOMYCIN HCL 500 MG IV SOLR
500.0000 mg | Freq: Once | INTRAVENOUS | Status: AC
  Administered 2014-10-22: 500 mg via INTRAVENOUS
  Filled 2014-10-22: qty 500

## 2014-10-22 MED ORDER — VANCOMYCIN HCL IN DEXTROSE 1-5 GM/200ML-% IV SOLN
1000.0000 mg | Freq: Three times a day (TID) | INTRAVENOUS | Status: DC
  Administered 2014-10-23 – 2014-10-24 (×4): 1000 mg via INTRAVENOUS
  Filled 2014-10-22 (×4): qty 200

## 2014-10-22 MED ORDER — FENTANYL BOLUS VIA INFUSION
50.0000 ug | INTRAVENOUS | Status: DC | PRN
  Administered 2014-10-23 – 2014-10-24 (×3): 50 ug via INTRAVENOUS
  Filled 2014-10-22: qty 50

## 2014-10-22 MED ORDER — POTASSIUM CHLORIDE 10 MEQ/50ML IV SOLN
10.0000 meq | INTRAVENOUS | Status: AC
  Administered 2014-10-22 (×8): 10 meq via INTRAVENOUS
  Filled 2014-10-22 (×6): qty 50

## 2014-10-22 NOTE — Progress Notes (Signed)
eLink Physician-Brief Progress Note Patient Name: ADRIANE GUGLIELMO DOB: [birth year] MRN: [id number]   Date of Service  10/22/2014  HPI/Events of Note  K 2.9  eICU Interventions  Repleted     Intervention Category Minor Interventions: Electrolytes abnormality - evaluation and management  Rossi Silvestro 10/22/2014, 8:58 PM

## 2014-10-22 NOTE — Progress Notes (Signed)
Nutrition Follow-up  DOCUMENTATION CODES:   Not applicable  INTERVENTION:  - Recommend Vital 1.5 @ 55 mL/hr with 30 mL Prostat BID to provide 2180 kcal (97% estimated kcal needs), 119 grams protein and, 1100 mL free water. - RD will continue to monitor for needs  NUTRITION DIAGNOSIS:   Inadequate oral intake related to inability to eat as evidenced by NPO status. -ongoing  GOAL:   Patient will meet greater than or equal to 90% of their needs -unmet  MONITOR:   Vent status, Labs, Weight trends, Skin, I & O's  ASSESSMENT:   39 year old male with past medical history of multiple sclerosis, neurogenic bladder with chronic indwelling foley catheter, peg tube for nutritional support. patient presented from home with reports of ongoing fevers and worsening upper respiratory tract congestion.   10/14 Patient is currently intubated on ventilator support MV: 13.4 L/min Temp (24hrs), Avg:104.3 F (40.2 C), Min:103 F (39.4 C), Max:105.1 F (40.6 C)  Propofol: none ml/hr  Pt with PEG at baseline and no TF running at this time. RN in the room states that MD mentioned possible TF initiation today.   Spoke with pt's mother who was at bedside. She states that at home pt receives 2 cans Osmolite 1.5 that runs from approximately 0730-2200 each day @ 7.5 mL/hr. She states that cans are poured into a bag and that bag is empty when TF is turned off. Two cans of Osmolite 1.5 provides 705 kcal, 29.8 grams protein, and 362 mL free water; question accuracy of this report given low protein and kcal provision.  Mother states that pt is unable to do anything PO, including water. She states that he had previously lost weight but is now regaining weight. Physical assessment not done at this visit as RN working with pt, but suspect malnutrition is present.   Needs re-estimated based on current vent settings and Tmax. Pt with hx of MS and is quadriplegic with extensive contractures so 10% estimated kcal  needs subtracted to account for this. Not meeting needs at this time. TF recommendations during intubation outlined above. Medications reviewed. Labs reviewed; Na: 128 mmol/L, K: 2.7 mmol/L, Cl: 96 mmol/L, Ca: 8 mg/dL.    10/13 - Pt was being intubated when RD was coming to see patient.  - Unable to perform nutrition focused physical exam and obtain history. - Pt with PEG tube, was receiving Osmolite 1.5 at home PTA. Unsure about goal rate from chart.  - RD has provided TF recommendations for while patient is intubated. - Patient is currently intubated on ventilator support MV: 8.5 L/min; Propofol: none  Diet Order:   NPO  Skin:  Wound (see comment) (Stage II knee and sacral ulcers)  Last BM:  10/12  Height:   Ht Readings from Last 1 Encounters:  10/20/14 6' 3.98" (1.93 m)    Weight:   Wt Readings from Last 1 Encounters:  10/22/14 167 lb 15.9 oz (76.2 kg)    Ideal Body Weight:  91.8 kg  BMI:  Body mass index is 20.46 kg/(m^2).  Estimated Nutritional Needs:   Kcal:  2240  Protein:  114-130 grams  Fluid:  2.3L/day  EDUCATION NEEDS:   No education needs identified at this time     Jarome Matin, RD, LDN Inpatient Clinical Dietitian Pager # [phone] After hours/weekend pager # [phone]

## 2014-10-22 NOTE — Progress Notes (Signed)
PULMONARY / CRITICAL CARE MEDICINE   Name: Terry Palmer MRN: [id number] DOB: 14-Jul-1975    ADMISSION DATE:  10/20/2014 CONSULTATION DATE: 10/13  REFERRING MD :Charlies Silvers  CHIEF COMPLAINT:  Febrile  INITIAL PRESENTATION: 39 yo w MS and significant debilitation including quadroplegia, dysphagia. Admitted with suspected UTI / suprapubic cath infxn. Developed resp distress and hypoxemia 10/13 w inability to manage secretions. At risk for ETT. PCCM consulted to evaluate 10/13  STUDIES:    SIGNIFICANT EVENTS: 10/12 admitted to hospital 1013 hypoxic and poor airway compliance   HISTORY OF PRESENT ILLNESS:   39 yo male diagnosed with MS 12 years ago and has had a continuous decline in health status. He is now quadriparetic , requires tube feeds but can communicate non verbally.  He has chronic indwelling foley catheter and presents with fever, dirty urine, clear chest x ray, tachycardia and febrile. Treated with Vanc/Ceftazadime but 10/13 he is hypoxic(despite NRB) and unable to clear secretions.  NTS's for copious secretions with improved O2 saturation and improved airway compliance. He may need intubation but will try to avoid at this time. He has extensive contractures. G tube and suprapubic catheter(changed 1 week ago).  PCCM asked to help in his care. 10/13 changed ceftaz to zosyn for aspiration cover.  SUBJECTIVE:  Has had some periods of tachycardia Fever > 104.1 this am   VITAL SIGNS: Temp:  [103 F (39.4 C)-105.1 F (40.6 C)] 104.1 F (40.1 C) (10/14 0800) Pulse Rate:  [115-134] 119 (10/14 0800) Resp:  [18-24] 24 (10/14 0800) BP: (115-164)/(72-100) 116/79 mmHg (10/14 0800) SpO2:  [99 %-100 %] 100 % (10/14 0800) FiO2 (%):  [40 %-100 %] 40 % (10/14 0800) Weight:  [76.2 kg (167 lb 15.9 oz)] 76.2 kg (167 lb 15.9 oz) (10/14 0428) HEMODYNAMICS:   VENTILATOR SETTINGS: Vent Mode:  [-] PRVC FiO2 (%):  [40 %-100 %] 40 % Set Rate:  [20 bmp-24 bmp] 24 bmp Vt Set:  [550 mL] 550  mL PEEP:  [8 cmH20] 8 cmH20 Plateau Pressure:  [20 cmH20-33 cmH20] 20 cmH20 INTAKE / OUTPUT:  Intake/Output Summary (Last 24 hours) at 10/22/14 0912 Last data filed at 10/22/14 0810  Gross per 24 hour  Intake   4595 ml  Output   3100 ml  Net   1495 ml    PHYSICAL EXAMINATION: General:  Contracted ill appearing male, intubated Neuro:  MS x 12 years, contracted, opens eyes to stim  HEENT:  PERL 4 mm, OP moist Cardiovascular:  HSR RRR Lungs:  Coarse rhonchi  Abdomen: + bd, PEG and suprapubic cath Musculoskeletal:  Contracted Skin: sacral decubitus   LABS:  CBC  Recent Labs Lab 10/20/14 1234 10/20/14 2210 10/22/14 0500  WBC 22.5* 18.9* 19.5*  HGB 13.5 12.6* 12.1*  HCT 41.7 39.2 35.5*  PLT 264 231 172   Coag's  Recent Labs Lab 10/20/14 2210  APTT 34  INR 1.16   BMET  Recent Labs Lab 10/20/14 1845 10/20/14 2223 10/22/14 0500  NA 136 139 128*  K 4.8 3.7 2.7*  CL 104 103 96*  CO2 24 28 20*  BUN 11 10 9   CREATININE 0.57* 0.61 0.69  GLUCOSE 95 92 91   Electrolytes  Recent Labs Lab 10/20/14 1845 10/20/14 2210 10/20/14 2223 10/22/14 0500  CALCIUM 8.5*  --  8.8* 8.0*  MG  --  1.9  --   --   PHOS  --  1.8*  --   --    Sepsis Markers  Recent Labs Lab  10/20/14 2210  10/21/14 0345 10/21/14 1000 10/21/14 1327  LATICACIDVEN 2.3*  < > 2.4* 0.8 2.6*  PROCALCITON <0.10  --   --   --   --   < > = values in this interval not displayed. ABG  Recent Labs Lab 10/21/14 0920 10/21/14 1245  PHART 7.180* 7.430  PCO2ART 74.6* 34.9*  PO2ART 200* 388*   Liver Enzymes  Recent Labs Lab 10/20/14 1234 10/20/14 1845  AST 25 30  ALT 22 17  ALKPHOS 101 94  BILITOT 0.4 0.6  ALBUMIN 4.6 4.1   Cardiac Enzymes No results for input(s): TROPONINI, PROBNP in the last 168 hours. Glucose  Recent Labs Lab 10/21/14 0735  GLUCAP 100*    Imaging Dg Chest Port 1 View  10/22/2014  CLINICAL DATA:  Respiratory failure. EXAM: PORTABLE CHEST 1 VIEW  COMPARISON:  10/21/2014. FINDINGS: Endotracheal tube, left IJ line stable position. Heart size normal. Continued improved aeration with mild bibasilar infiltrates. No pleural effusion or pneumothorax. IMPRESSION: 1. Lines and tubes in stable position. 2. Continued improved aeration with mild bibasilar infiltrates . Electronically Signed   By: Marcello Moores  Register   On: 10/22/2014 07:25   Dg Chest Port 1 View  10/21/2014  CLINICAL DATA:  Mechanical respiratory failure EXAM: PORTABLE CHEST 1 VIEW COMPARISON:  Earlier today FINDINGS: Improved aeration after intubation. Endotracheal tube tip is between the clavicular heads and carina. The orogastric tube is coiled in the stomach. Left IJ central line, tip at the distal SVC. Persistent right basilar opacity with volume loss and air bronchograms. Probable small right pleural effusion. No pneumothorax. No pulmonary edema. IMPRESSION: 1. Unremarkable positioning of new tubes and central line. 2. Improved aeration after intubation. 3. Right basilar atelectasis and/or pneumonia. Electronically Signed   By: Monte Fantasia M.D.   On: 10/21/2014 12:28   Dg Chest Port 1 View  10/21/2014  CLINICAL DATA:  Multiple sclerosis. Fever, sepsis and shortness of breath. EXAM: PORTABLE CHEST - 1 VIEW COMPARISON:  One-view chest x-ray 10/20/2014 FINDINGS: The lungs volumes are low. The heart size is normal. Right lower lobe pneumonia or aspiration is present. Moderate pulmonary vascular congestion is present. A small right pleural effusion may be present. There is no definite left pleural effusion. Moderate distention of the colon is evident below the right hemidiaphragm. IMPRESSION: 1. Right lower lobe pneumonia or aspiration. 2. Low lung volumes with moderate pulmonary vascular congestion. 3. Moderate gaseous distention of the bowel in the right upper quadrant, mostly colon. Electronically Signed   By: San Morelle M.D.   On: 10/21/2014 10:05     ASSESSMENT /  PLAN:  PULMONARY A: Hypoxia in setting of poor secretion clearance B atelectasis, at risk aspiration PNA P:   Continue current vent support He will almost certainly need tracheostomy to successfully be liberated from MV  CARDIOVASCULAR A: Sinus tachycardia  P:  Address fever and increase sedation slightly  RENAL Lab Results  Component Value Date   CREATININE 0.69 10/22/2014   CREATININE 0.61 10/20/2014   CREATININE 0.57* 10/20/2014  A:   Suprapubic cath Hyponatremia  P:   Catheter changed Hold free water for now  GASTROINTESTINAL A:   Peg for TF SUP P:   Restart TF per PEG.  PPI   HEMATOLOGIC A:   Chronic anemia P:  Follow cbc  INFECTIOUS A:   Presumed UTI RLE pressure wound P:   BCx2 10/13>> UC 10/13>> Resp (from suctioning) 10/13 >>  Abx: 10/12 vanc>> 10/12 ceftaz>>10/13 (dc and changed  to Zosyn to cover possible aspiration) 10/13 zosyn>>   Wound care following RLE  ENDOCRINE A:  No acute issue  P:    NEUROLOGIC A:  MS with Quadriparesis  Non verbal Metabolic encephalopathic  Szs disorder P:   RASS goal: 0 change to fentanyl gtt, continue versed pushes prn Continue Keppra    FAMILY  - Updates: reviewed status with mother and wife 10/13 and 14. Have explained that he will likely also ultimately require trach. Pt would want to be Full code, would want a chronic trach to manage secretions etc going forward.   - Inter-disciplinary family meet or Palliative Care meeting due by:  10/28/14    Independent critical care time is 35 minutes.   Baltazar Apo, MD, PhD 10/22/2014, 9:12 AM [facility] [phone] or if no answer [phone]

## 2014-10-22 NOTE — Progress Notes (Signed)
ANTIBIOTIC CONSULT NOTE - Follow up  Pharmacy Consult for Vancomycin / Zosyn Indication: Sepsis  No Known Allergies  Patient Measurements: Height: 6' 3.98" (193 cm) Weight: 167 lb 15.9 oz (76.2 kg) IBW/kg (Calculated) : 86.76  Vital Signs: Temp: 104.1 F (40.1 C) (10/14 1209) Temp Source: Oral (10/14 1209) BP: 127/82 mmHg (10/14 1400) Pulse Rate: 124 (10/14 1400) Intake/Output from previous day: 10/13 0701 - 10/14 0700 In: 4890 [I.V.:3000; NG/GT:1220; IV Piggyback:550] Out: 2600 [Urine:1600; Emesis/NG output:1000]  Labs:  Recent Labs  10/20/14 1234 10/20/14 1845 10/20/14 2210 10/20/14 2223 10/22/14 0500  WBC 22.5*  --  18.9*  --  19.5*  HGB 13.5  --  12.6*  --  12.1*  PLT 264  --  231  --  172  CREATININE 0.51* 0.57*  --  0.61 0.69   Estimated Creatinine Clearance: 132.3 mL/min (by C-G formula based on Cr of 0.69). No results for input(s): VANCOTROUGH, VANCOPEAK, VANCORANDOM, GENTTROUGH, GENTPEAK, GENTRANDOM, TOBRATROUGH, TOBRAPEAK, TOBRARND, AMIKACINPEAK, AMIKACINTROU, AMIKACIN in the last 72 hours.   Assessment: 67 yoM with hx seizures and debilitating MS with suprapubic catheter presents with grand mal seizure and UTI.  Pharmacy consulted to start vancomycin and ceftazidime initially, but today ceftazidime is changed to Zosyn for anaerobe coverage with possible aspiration.  Anti-infectives 10/12 >> Zosyn x1 10/12 >> Vancomycin >> 10/12 >> Ceftazidime >> 10/13 10/13 >> Zosyn >>  Vitals/Labs  Tc 104.7  WBC remain elevated  SCr 0.69, CrCl >100 - overestimated with quadraparesis/bedbound state  LA 1.3 > 2.4  CXR (10/13) w/ viral/atypical pneumonia vs pulmonary vascular congestion.  Vanc Trough = 5mg /L on 1g q12h.  Cultures 10/12 bloodx2: ngtd 10/12 urine: IP (U/A cloudy, many bacteria and WBC, large leukocytes, +nitrites) 10/12 Influenza panel: negative 10/13 Strep pneumo UAg: negative  Goal of Therapy:  Vancomycin trough level 15-20 mcg/ml   Appropriate abx dosing, eradication of infection.   Plan:   Continue Zosyn 3.375g IV Q8H infused over 4hrs.   Give a Vanc bolus of 1.5g now then change to 1g IV q8h.  Recheck Vanc trough at steady state. Follow up renal fxn, culture results, and clinical course.  Romeo Rabon, PharmD, pager [phone]. 10/22/2014,2:50 PM.

## 2014-10-22 NOTE — Progress Notes (Signed)
eLink Physician-Brief Progress Note Patient Name: Terry Palmer DOB: 15-Dec-1975 MRN: [id number]   Date of Service  10/22/2014  HPI/Events of Note  Ongoing tachycardia with HR In the 150s to 160s.  HD stable.  Has been febrile and received tylenol.    eICU Interventions  Nurse to try care measures such as bathing/anxiolytics.  If these do not work prn BB order written.     Intervention Category Intermediate Interventions: Arrhythmia - evaluation and management  Raynesha Tiedt 10/22/2014, 1:27 AM

## 2014-10-22 NOTE — Progress Notes (Signed)
[facility] ADULT ICU REPLACEMENT PROTOCOL FOR AM LAB REPLACEMENT ONLY  The patient does apply for the [facility] Adult ICU Electrolyte Replacment Protocol based on the criteria listed below:   1. Is GFR >/= 40 ml/min? Yes.    Patient's GFR today is >60 2. Is urine output >/= 0.5 ml/kg/hr for the last 6 hours? Yes.   Patient's UOP is 1.4 ml/kg/hr 3. Is BUN < 60 mg/dL? Yes.    Patient's BUN today is 9 4. Abnormal electrolyte(s):K2.7 5. Ordered repletion with: per protocol 6. If a panic level lab has been reported, has the CCM MD in charge been notified? Yes.  .   Physician:  E Deterding,MD   Vear Clock 10/22/2014 6:41 AM

## 2014-10-22 NOTE — Progress Notes (Signed)
Date: October 22, 2014 Chart reviewed for concurrent status and case management needs. Will continue to follow patient for changes and needs: Temp this am is 105.3, labs still showing severe sepsis. Velva Harman, RN, BSN, [place]   [phone]

## 2014-10-23 ENCOUNTER — Inpatient Hospital Stay (HOSPITAL_COMMUNITY): Payer: Commercial Managed Care - HMO

## 2014-10-23 DIAGNOSIS — R509 Fever, unspecified: Secondary | ICD-10-CM

## 2014-10-23 LAB — CBC
HEMATOCRIT: 34.4 % — AB (ref 39.0–52.0)
Hemoglobin: 12.4 g/dL — ABNORMAL LOW (ref 13.0–17.0)
MCH: 30.9 pg (ref 26.0–34.0)
MCHC: 36 g/dL (ref 30.0–36.0)
MCV: 85.8 fL (ref 78.0–100.0)
PLATELETS: 160 10*3/uL (ref 150–400)
RBC: 4.01 MIL/uL — AB (ref 4.22–5.81)
RDW: 12.2 % (ref 11.5–15.5)
WBC: 18.4 10*3/uL — AB (ref 4.0–10.5)

## 2014-10-23 LAB — GLUCOSE, CAPILLARY
GLUCOSE-CAPILLARY: 109 mg/dL — AB (ref 65–99)
Glucose-Capillary: 107 mg/dL — ABNORMAL HIGH (ref 65–99)
Glucose-Capillary: 102 mg/dL — ABNORMAL HIGH (ref 65–99)
Glucose-Capillary: 155 mg/dL — ABNORMAL HIGH (ref 65–99)

## 2014-10-23 LAB — BASIC METABOLIC PANEL
Anion gap: 14 (ref 5–15)
BUN: 8 mg/dL (ref 6–20)
CHLORIDE: 96 mmol/L — AB (ref 101–111)
CO2: 16 mmol/L — AB (ref 22–32)
CREATININE: 0.6 mg/dL — AB (ref 0.61–1.24)
Calcium: 8.1 mg/dL — ABNORMAL LOW (ref 8.9–10.3)
GFR calc non Af Amer: >60 mL/min (ref >60)
Glucose, Bld: 98 mg/dL (ref 65–99)
POTASSIUM: 3.3 mmol/L — AB (ref 3.5–5.1)
Sodium: 126 mmol/L — ABNORMAL LOW (ref 135–145)

## 2014-10-23 LAB — LEGIONELLA PNEUMOPHILA SEROGP 1 UR AG: L. PNEUMOPHILA SEROGP 1 UR AG: NEGATIVE

## 2014-10-23 MED ORDER — VITAL HIGH PROTEIN PO LIQD: 1000.0000 mL | ORAL | Status: DC

## 2014-10-23 MED ORDER — CHLORHEXIDINE GLUCONATE CLOTH 2 % EX PADS
6.0000 | MEDICATED_PAD | Freq: Every day | CUTANEOUS | Status: AC
  Administered 2014-10-23 – 2014-10-24 (×2): 6 via TOPICAL

## 2014-10-23 MED ORDER — POTASSIUM CHLORIDE 20 MEQ/15ML (10%) PO SOLN
40.0000 meq | Freq: Once | ORAL | Status: AC
  Administered 2014-10-23: 40 meq
  Filled 2014-10-23: qty 30

## 2014-10-23 MED ORDER — VITAMINS A & D EX OINT
TOPICAL_OINTMENT | CUTANEOUS | Status: AC
  Administered 2014-10-23: 1
  Filled 2014-10-23: qty 5

## 2014-10-23 MED ORDER — GUAIFENESIN 100 MG/5ML PO SOLN
10.0000 mL | Freq: Three times a day (TID) | ORAL | Status: DC
  Administered 2014-10-23 – 2014-11-02 (×29): 200 mg via ORAL
  Filled 2014-10-23 (×28): qty 10

## 2014-10-23 MED ORDER — OSMOLITE 1.5 CAL PO LIQD
1000.0000 mL | ORAL | Status: DC
  Administered 2014-10-23: 1000 mL
  Filled 2014-10-23 (×2): qty 1000

## 2014-10-23 MED ORDER — ADULT MULTIVITAMIN LIQUID CH
5.0000 mL | Freq: Every day | ORAL | Status: DC
  Administered 2014-10-23 – 2014-11-02 (×11): 5 mL
  Filled 2014-10-23 (×17): qty 5

## 2014-10-23 NOTE — Progress Notes (Signed)
PULMONARY / CRITICAL CARE MEDICINE   Name: Terry Palmer MRN: [id number] DOB: Feb 16, 1975    ADMISSION DATE:  10/20/2014 CONSULTATION DATE: 10/13  REFERRING MD :Charlies Silvers  CHIEF COMPLAINT:  Febrile  INITIAL PRESENTATION: 39 yo w MS and significant debilitation including quadroplegia, dysphagia. Admitted with suspected UTI / suprapubic cath infxn. Developed resp distress and hypoxemia 10/13 w inability to manage secretions. At risk for ETT. PCCM consulted to evaluate 10/13  STUDIES:    SIGNIFICANT EVENTS: 10/12 admitted to hospital 10/13 hypoxic and poor airway compliance, intubated    SUBJECTIVE:  Secretions improved Remains on vent  VITAL SIGNS: Temp:  [99.1 F (37.3 C)-104.4 F (40.2 C)] 100.1 F (37.8 C) (10/15 0800) Pulse Rate:  [101-139] 118 (10/15 0900) Resp:  [6-24] 12 (10/15 0900) BP: (100-141)/(70-94) 141/94 mmHg (10/15 0900) SpO2:  [98 %-100 %] 100 % (10/15 0900) FiO2 (%):  [40 %] 40 % (10/15 0844) Weight:  [74.3 kg (163 lb 12.8 oz)] 74.3 kg (163 lb 12.8 oz) (10/15 0500) HEMODYNAMICS:   VENTILATOR SETTINGS: Vent Mode:  [-] PRVC FiO2 (%):  [40 %] 40 % Set Rate:  [24 bmp] 24 bmp Vt Set:  [550 mL] 550 mL PEEP:  [8 cmH20] 8 cmH20 Plateau Pressure:  [22 cmH20-27 cmH20] 26 cmH20 INTAKE / OUTPUT:  Intake/Output Summary (Last 24 hours) at 10/23/14 1053 Last data filed at 10/23/14 0900  Gross per 24 hour  Intake 1464.58 ml  Output   2645 ml  Net -1180.42 ml    PHYSICAL EXAMINATION: General:  On vent, no distress HEENT: NCAT ETT in place PULM: CTA B on my exam, vent supported breaths CV: RRR, no mgr GI: BS+, soft, nontender Derm: PEG tube in place, suprapubic catheter in place Neuro: awake, opens eyes to voice, cannot appreciably follow commands MSK: multiple muscular contractures  LABS:  CBC  Recent Labs Lab 10/20/14 2210 10/22/14 0500 10/23/14 0530  WBC 18.9* 19.5* 18.4*  HGB 12.6* 12.1* 12.4*  HCT 39.2 35.5* 34.4*  PLT 231 172 160    Coag's  Recent Labs Lab 10/20/14 2210  APTT 34  INR 1.16   BMET  Recent Labs Lab 10/22/14 0500 10/22/14 1955 10/23/14 0530  NA 128* 124* 126*  K 2.7* 2.9* 3.3*  CL 96* 95* 96*  CO2 20* 17* 16*  BUN 9 8 8   CREATININE 0.69 0.63 0.60*  GLUCOSE 91 88 98   Electrolytes  Recent Labs Lab 10/20/14 2210  10/22/14 0500 10/22/14 1955 10/23/14 0530  CALCIUM  --   < > 8.0* 8.0* 8.1*  MG 1.9  --   --   --   --   PHOS 1.8*  --   --   --   --   < > = values in this interval not displayed. Sepsis Markers  Recent Labs Lab 10/20/14 2210  10/21/14 0345 10/21/14 1000 10/21/14 1327  LATICACIDVEN 2.3*  < > 2.4* 0.8 2.6*  PROCALCITON <0.10  --   --   --   --   < > = values in this interval not displayed. ABG  Recent Labs Lab 10/21/14 0920 10/21/14 1245  PHART 7.180* 7.430  PCO2ART 74.6* 34.9*  PO2ART 200* 388*   Liver Enzymes  Recent Labs Lab 10/20/14 1234 10/20/14 1845  AST 25 30  ALT 22 17  ALKPHOS 101 94  BILITOT 0.4 0.6  ALBUMIN 4.6 4.1   Cardiac Enzymes No results for input(s): TROPONINI, PROBNP in the last 168 hours. Glucose  Recent Labs Lab 10/21/14  0623 10/22/14 0759 10/23/14 0533 10/23/14 0818  GLUCAP 100* 82 107* 102*    Imaging Dg Chest Port 1 View  10/23/2014  CLINICAL DATA:  39 year old male with history of acute respiratory failure. Aspiration pneumonia. EXAM: PORTABLE CHEST 1 VIEW COMPARISON:  Chest x-ray 10/22/2014. FINDINGS: An endotracheal tube is in place with tip 5.0 cm above the carina. There is a left-sided internal jugular central venous catheter with tip terminating in the distal superior vena cava. Lung volumes are low. No consolidative airspace disease. No pleural effusions. No pneumothorax. No pulmonary nodule or mass noted. Pulmonary vasculature and the cardiomediastinal silhouette are within normal limits. IMPRESSION: 1. Support apparatus, as above. 2. Low lung volumes without radiographic evidence of acute cardiopulmonary  disease. Electronically Signed   By: Vinnie Langton M.D.   On: 10/23/2014 07:14     ASSESSMENT / PLAN:  PULMONARY A: Hypoxia in setting of poor secretion clearance> improving B atelectasis, no clear evidence of pneumonia P:   Continue full vent support with daily weaning trials If secretions continue to improve then we may extubate prior to considering tracheostomy Continue pulmonary toilette measures Add guaifenesin Chest PT per bed  CARDIOVASCULAR A: Sinus tachycardia in setting of fever, infection> improving P:  Telemetry monitoring Continue IVF  RENAL Lab Results  Component Value Date   CREATININE 0.60* 10/23/2014   CREATININE 0.63 10/22/2014   CREATININE 0.69 10/22/2014  A:   Suprapubic cath> Catheter changed Hyponatremia  Hypokalemia P:   Free water restrict Monitor BMET and UOP Replace electrolytes as needed Replace KCL  GASTROINTESTINAL A:   Peg for Tube feedings Stress ulcer prophylaxis > takes Omeprazole at home P:   Restart osmolite (home tube feed Rx)  PPI  > continue  HEMATOLOGIC A:   Chronic anemia P:  Monitor for bleeding  INFECTIOUS A:   Presumed UTI RLE pressure wound High Fever > P:   BCx2 10/13>> UC 10/12>> multiple species Resp (from suctioning) 10/13 >>  Abx: 10/12 vanc>> 10/12 ceftaz>>10/13 (dc and changed to Zosyn to cover possible aspiration) 10/13 zosyn>>   Continue cooling measures externally (ice packs, etc)  ENDOCRINE A:  No acute issue  P:    NEUROLOGIC A:  MS with Quadriparesis  Non verbal Metabolic encephalopathic  Szs disorder P:   RASS goal: 0 change to fentanyl gtt, continue versed pushes prn Continue Keppra    FAMILY  - Updates:updated mother at bedside 10/15  - Inter-disciplinary family meet or Palliative Care meeting due by:  10/28/14    Independent critical care time is 37 minutes.   Roselie Awkward, MD [facility] Pager: [phone] Cell: [phone] After 3pm or if no response,  call [phone]

## 2014-10-24 ENCOUNTER — Inpatient Hospital Stay (HOSPITAL_COMMUNITY): Payer: Commercial Managed Care - HMO

## 2014-10-24 DIAGNOSIS — R627 Adult failure to thrive: Secondary | ICD-10-CM

## 2014-10-24 DIAGNOSIS — E876 Hypokalemia: Secondary | ICD-10-CM

## 2014-10-24 LAB — BASIC METABOLIC PANEL
ANION GAP: 10 (ref 5–15)
BUN: 7 mg/dL (ref 6–20)
CHLORIDE: 102 mmol/L (ref 101–111)
CO2: 22 mmol/L (ref 22–32)
Calcium: 8.7 mg/dL — ABNORMAL LOW (ref 8.9–10.3)
Creatinine, Ser: 0.59 mg/dL — ABNORMAL LOW (ref 0.61–1.24)
GFR calc non Af Amer: >60 mL/min (ref >60)
Glucose, Bld: 126 mg/dL — ABNORMAL HIGH (ref 65–99)
POTASSIUM: 2.8 mmol/L — AB (ref 3.5–5.1)
SODIUM: 134 mmol/L — AB (ref 135–145)

## 2014-10-24 LAB — CBC WITH DIFFERENTIAL/PLATELET
BASOS PCT: 0 %
Basophils Absolute: 0 10*3/uL (ref 0.0–0.1)
EOS ABS: 0 10*3/uL (ref 0.0–0.7)
Eosinophils Relative: 0 %
HCT: 34.9 % — ABNORMAL LOW (ref 39.0–52.0)
Hemoglobin: 12.8 g/dL — ABNORMAL LOW (ref 13.0–17.0)
LYMPHS ABS: 1.9 10*3/uL (ref 0.7–4.0)
Lymphocytes Relative: 16 %
MCH: 31.4 pg (ref 26.0–34.0)
MCHC: 36.7 g/dL — ABNORMAL HIGH (ref 30.0–36.0)
MCV: 85.7 fL (ref 78.0–100.0)
MONOS PCT: 8 %
Monocytes Absolute: 0.9 10*3/uL (ref 0.1–1.0)
NEUTROS ABS: 8.7 10*3/uL — AB (ref 1.7–7.7)
NEUTROS PCT: 75 %
PLATELETS: 188 10*3/uL (ref 150–400)
RBC: 4.07 MIL/uL — AB (ref 4.22–5.81)
RDW: 12.3 % (ref 11.5–15.5)
WBC: 11.6 10*3/uL — AB (ref 4.0–10.5)

## 2014-10-24 LAB — GLUCOSE, CAPILLARY
GLUCOSE-CAPILLARY: 117 mg/dL — AB (ref 65–99)
GLUCOSE-CAPILLARY: 130 mg/dL — AB (ref 65–99)
GLUCOSE-CAPILLARY: 138 mg/dL — AB (ref 65–99)
Glucose-Capillary: 137 mg/dL — ABNORMAL HIGH (ref 65–99)
Glucose-Capillary: 138 mg/dL — ABNORMAL HIGH (ref 65–99)
Glucose-Capillary: 145 mg/dL — ABNORMAL HIGH (ref 65–99)

## 2014-10-24 LAB — VANCOMYCIN, TROUGH: Vancomycin Tr: 20 ug/mL (ref 10.0–20.0)

## 2014-10-24 MED ORDER — PRO-STAT SUGAR FREE PO LIQD
30.0000 mL | Freq: Two times a day (BID) | ORAL | Status: DC
  Administered 2014-10-24 – 2014-10-25 (×3): 30 mL
  Filled 2014-10-24 (×3): qty 30

## 2014-10-24 MED ORDER — METHYLPREDNISOLONE SODIUM SUCC 125 MG IJ SOLR
60.0000 mg | Freq: Two times a day (BID) | INTRAMUSCULAR | Status: DC
  Administered 2014-10-24 (×2): 60 mg via INTRAVENOUS
  Filled 2014-10-24 (×2): qty 2

## 2014-10-24 MED ORDER — VANCOMYCIN HCL 10 G IV SOLR
1250.0000 mg | Freq: Two times a day (BID) | INTRAVENOUS | Status: DC
  Administered 2014-10-24 (×2): 1250 mg via INTRAVENOUS
  Filled 2014-10-24 (×3): qty 1250

## 2014-10-24 MED ORDER — NOREPINEPHRINE BITARTRATE 1 MG/ML IV SOLN
2.0000 ug/min | INTRAVENOUS | Status: DC
  Administered 2014-10-24 – 2014-10-25 (×2): 2 ug/min via INTRAVENOUS
  Filled 2014-10-24 (×2): qty 4

## 2014-10-24 MED ORDER — FREE WATER
100.0000 mL | Freq: Three times a day (TID) | Status: DC
  Administered 2014-10-24 – 2014-10-25 (×3): 100 mL

## 2014-10-24 MED ORDER — SODIUM CHLORIDE 0.9 % IV BOLUS (SEPSIS)
1000.0000 mL | Freq: Once | INTRAVENOUS | Status: AC
  Administered 2014-10-24: 1000 mL via INTRAVENOUS

## 2014-10-24 MED ORDER — SODIUM CHLORIDE 0.9 % IV SOLN: 1.0000 mg/h | INTRAVENOUS | Status: DC

## 2014-10-24 MED ORDER — VITAL 1.5 CAL PO LIQD
1000.0000 mL | ORAL | Status: DC
  Administered 2014-10-24 – 2014-10-25 (×2): 1000 mL
  Filled 2014-10-24 (×5): qty 1000

## 2014-10-24 MED ORDER — POTASSIUM CHLORIDE 20 MEQ/15ML (10%) PO SOLN
40.0000 meq | Freq: Once | ORAL | Status: AC
  Administered 2014-10-24: 40 meq
  Filled 2014-10-24: qty 30

## 2014-10-24 MED ORDER — POTASSIUM CHLORIDE 10 MEQ/50ML IV SOLN
10.0000 meq | INTRAVENOUS | Status: AC
  Administered 2014-10-24 (×8): 10 meq via INTRAVENOUS
  Filled 2014-10-24 (×7): qty 50

## 2014-10-24 NOTE — Progress Notes (Signed)
Nutrition Follow-up  INTERVENTION:   Initiate Vital 1.5 @ 20 ml/hr via PEG and increase by 10 ml every 4 hours to goal rate of 55 ml/hr.   30 ml Prostat BID.    Tube feeding regimen provides 2180 kcal (95% of needs), 119 grams of protein, and 1100 ml of H2O.   RD to continue to monitor  NUTRITION DIAGNOSIS:   Inadequate oral intake related to inability to eat as evidenced by NPO status.  Ongoing.  GOAL:   Patient will meet greater than or equal to 90% of their needs  Not meeting yet.  MONITOR:   Vent status, Labs, Weight trends, Skin, I & O's  REASON FOR ASSESSMENT:   Consult Enteral/tube feeding initiation and management  ASSESSMENT:   39 year old male with past medical history of multiple sclerosis, neurogenic bladder with chronic indwelling foley catheter, peg tube for nutritional support. patient presented from home with reports of ongoing fevers and worsening upper respiratory tract congestion.   Switching formula to Vital 1.5 while patient is intubated. Can resume home regimen prior to discharge.  Patient is currently intubated on ventilator support MV: 13.1 L/min Temp (24hrs), Avg:100.1 F (37.8 C), Min:97.7 F (36.5 C), Max:101.8 F (38.8 C)  Propofol: none  Labs reviewed: CBGs: 117-137 Low Na, K, Creatinine  10/14 -Spoke with pt's mother who was at bedside. She states that at home pt receives 2 cans Osmolite 1.5 that runs from approximately 0730-2200 each day @ 7.5 mL/hr. She states that cans are poured into a bag and that bag is empty when TF is turned off. Two cans of Osmolite 1.5 provides 705 kcal, 29.8 grams protein, and 362 mL free water; question accuracy of this report given low protein and kcal provision. -Mother states that pt is unable to do anything PO, including water.  10/13 - Pt was being intubated when RD was coming to see patient.  - Unable to perform nutrition focused physical exam and obtain history. - Pt with PEG tube, was  receiving Osmolite 1.5 at home PTA. Unsure about goal rate from chart.  - RD has provided TF recommendations for while patient is intubated. - Patient is currently intubated on ventilator support  Diet Order:     Skin:  Wound (see comment) (Stage II knee and sacral ulcers)  Last BM:  10/12  Height:   Ht Readings from Last 1 Encounters:  10/20/14 6' 3.98" (1.93 m)    Weight:   Wt Readings from Last 1 Encounters:  10/24/14 163 lb 12.8 oz (74.3 kg)    Ideal Body Weight:  91.8 kg  BMI:  Body mass index is 19.95 kg/(m^2).  Estimated Nutritional Needs:   Kcal:  2293  Protein:  110-120g  Fluid:  2.3L/day  EDUCATION NEEDS:   No education needs identified at this time  Clayton Bibles, MS, RD, LDN Pager: [phone] After Hours Pager: [phone]

## 2014-10-24 NOTE — Progress Notes (Addendum)
PULMONARY / CRITICAL CARE MEDICINE   Name: Terry Palmer MRN: [id number] DOB: 1975/03/12    ADMISSION DATE:  10/20/2014 CONSULTATION DATE: 10/13  REFERRING MD :Charlies Silvers  CHIEF COMPLAINT:  Febrile  INITIAL PRESENTATION: 39 yo w MS and significant debilitation including quadroplegia, dysphagia. Admitted with suspected UTI / suprapubic cath infxn. Developed resp distress and hypoxemia 10/13 w inability to manage secretions. At risk for ETT. PCCM consulted to evaluate 10/13  STUDIES:    SIGNIFICANT EVENTS: 10/12 admitted to hospital 10/13 hypoxic and poor airway compliance, intubated 10/16 Severe vent dyssynchrony this morning, thick secretions   SUBJECTIVE:  Severe vent dyssynchrony this morning Thick secretions   VITAL SIGNS: Temp:  [97.7 F (36.5 C)-101.8 F (38.8 C)] 101.1 F (38.4 C) (10/16 0800) Pulse Rate:  [106-139] 116 (10/16 0910) Resp:  [13-24] 24 (10/16 0910) BP: (66-128)/(47-88) 80/63 mmHg (10/16 0910) SpO2:  [90 %-100 %] 97 % (10/16 0910) FiO2 (%):  [40 %] 40 % (10/16 0831) Weight:  [74.3 kg (163 lb 12.8 oz)] 74.3 kg (163 lb 12.8 oz) (10/16 0733) HEMODYNAMICS:   VENTILATOR SETTINGS: Vent Mode:  [-] PRVC FiO2 (%):  [40 %] 40 % Set Rate:  [24 bmp] 24 bmp Vt Set:  [550 mL] 550 mL PEEP:  [8 cmH20] 8 cmH20 Plateau Pressure:  [20 cmH20-23 cmH20] 23 cmH20 INTAKE / OUTPUT:  Intake/Output Summary (Last 24 hours) at 10/24/14 1122 Last data filed at 10/24/14 0745  Gross per 24 hour  Intake  895.5 ml  Output   3525 ml  Net -2629.5 ml    PHYSICAL EXAMINATION: General:  On vent HEENT: NCAT ETT in place PULM:  Wheezing bilaterally, vent supported breaths CV: RRR, no mgr GI: BS+, soft, nontender Derm: PEG tube in place, suprapubic catheter in place Neuro: heavily sedated MSK: multiple muscular contractures  LABS:  CBC  Recent Labs Lab 10/22/14 0500 10/23/14 0530 10/24/14 0509  WBC 19.5* 18.4* 11.6*  HGB 12.1* 12.4* 12.8*  HCT 35.5* 34.4*  34.9*  PLT 172 160 188   Coag's  Recent Labs Lab 10/20/14 2210  APTT 34  INR 1.16   BMET  Recent Labs Lab 10/22/14 1955 10/23/14 0530 10/24/14 0509  NA 124* 126* 134*  K 2.9* 3.3* 2.8*  CL 95* 96* 102  CO2 17* 16* 22  BUN 8 8 7   CREATININE 0.63 0.60* 0.59*  GLUCOSE 88 98 126*   Electrolytes  Recent Labs Lab 10/20/14 2210  10/22/14 1955 10/23/14 0530 10/24/14 0509  CALCIUM  --   < > 8.0* 8.1* 8.7*  MG 1.9  --   --   --   --   PHOS 1.8*  --   --   --   --   < > = values in this interval not displayed. Sepsis Markers  Recent Labs Lab 10/20/14 2210  10/21/14 0345 10/21/14 1000 10/21/14 1327  LATICACIDVEN 2.3*  < > 2.4* 0.8 2.6*  PROCALCITON <0.10  --   --   --   --   < > = values in this interval not displayed. ABG  Recent Labs Lab 10/21/14 0920 10/21/14 1245  PHART 7.180* 7.430  PCO2ART 74.6* 34.9*  PO2ART 200* 388*   Liver Enzymes  Recent Labs Lab 10/20/14 1234 10/20/14 1845  AST 25 30  ALT 22 17  ALKPHOS 101 94  BILITOT 0.4 0.6  ALBUMIN 4.6 4.1   Cardiac Enzymes No results for input(s): TROPONINI, PROBNP in the last 168 hours. Glucose  Recent Labs Lab 10/23/14  1224 10/23/14 1645 10/23/14 2036 10/23/14 2352 10/24/14 0435 10/24/14 0756  GLUCAP 109* 155* 138* 137* 117* 130*    Imaging 10/16 CXR worsening airspace disease RLL, increased R hemidiaphragm elevation   ASSESSMENT / PLAN:  PULMONARY A: Hypoxia in setting of poor secretion clearance B atelectasis, no clear evidence of pneumonia Asthma? Wheezing more on exam 10/16 P:   Add Solumedrol today, continue bronchodilators Continue full vent support with daily weaning trials May need tracheostomy Continue pulmonary toilette measures Continue guaifenesin Chest PT per bed  CARDIOVASCULAR A: Sinus tachycardia in setting of fever, infection> improving Shock> 10/16, likely sedation related, hypovolemia? P:  Telemetry monitoring Continue IVF Bolus IVF Start  levophed to maintain MAP > 65  RENAL Lab Results  Component Value Date   CREATININE 0.59* 10/24/2014   CREATININE 0.60* 10/23/2014   CREATININE 0.63 10/22/2014  A:   Suprapubic cath> Catheter changed Hyponatremia improved today Hypokalemia > severe 10/16 P:   Start low dose free water Monitor BMET and UOP Replace electrolytes as needed Replace KCL (IV KCL per Elink, will add oral KCL)  GASTROINTESTINAL A:   Peg for Tube feedings Stress ulcer prophylaxis > takes Omeprazole at home P:   Continue osmolite (home tube feed Rx)  PPI  > continue  HEMATOLOGIC A:   Chronic anemia P:  Monitor for bleeding  INFECTIOUS A:   Presumed UTI RLE pressure wound High Fever > improving P:   BCx2 10/13>> UC 10/12>> multiple species Resp (from suctioning) 10/13 >>   Abx: 10/12 vanc>> consider stopping 10/17 10/12 ceftaz>>10/13 (dc and changed to Zosyn to cover possible aspiration) 10/13 zosyn>>   Continue cooling measures externally (ice packs, etc)  ENDOCRINE A:  No acute issue  P:    NEUROLOGIC A:  MS with Quadriparesis  Non verbal Metabolic encephalopathic  Szs disorder P:   RASS goal: -1 change to fentanyl gtt, continue versed pushes prn Continue Keppra    FAMILY  - Updates:updated mother at bedside 10/15  - Inter-disciplinary family meet or Palliative Care meeting due by:  10/28/14    Independent critical care time is 38 minutes.   Roselie Awkward, MD [facility] Pager: [phone] Cell: [phone] After 3pm or if no response, call [phone]

## 2014-10-24 NOTE — Progress Notes (Signed)
[facility] ICU Electrolyte Replacement Protocol  Patient Name: Terry Palmer DOB: 1975/02/19 MRN: [id number]  Date of Service  10/24/2014   HPI/Events of Note    Recent Labs Lab 10/20/14 2210 10/20/14 2223 10/22/14 0500 10/22/14 1955 10/23/14 0530 10/24/14 0509  NA  --  139 128* 124* 126* 134*  K  --  3.7 2.7* 2.9* 3.3* 2.8*  CL  --  103 96* 95* 96* 102  CO2  --  28 20* 17* 16* 22  GLUCOSE  --  92 91 88 98 126*  BUN  --  10 9 8 8 7   CREATININE  --  0.61 0.69 0.63 0.60* 0.59*  CALCIUM  --  8.8* 8.0* 8.0* 8.1* 8.7*  MG 1.9  --   --   --   --   --   PHOS 1.8*  --   --   --   --   --     Estimated Creatinine Clearance: 129 mL/min (by C-G formula based on Cr of 0.59).  Intake/Output      10/15 0701 - 10/16 0700   I.V. (mL/kg) 73 (1)   Other 20   NG/GT 175   IV Piggyback 725   Total Intake(mL/kg) 993 (13.4)   Urine (mL/kg/hr) 3375 (1.9)   Stool 0 (0)   Total Output 3375   Net -2382       Stool Occurrence 0 x    - I/O DETAILED x24h    Total I/O In: 290.5 [I.V.:25.5; NG/GT:40; IV Piggyback:225] Out: 2200 [Urine:2200] - I/O THIS SHIFT    ASSESSMENT   eICURN Interventions  Electrolyte protocol criteria met. Irregular labs replaced per protocol. MD notified.   ASSESSMENT: MAJOR ELECTROLYTE    [name] 10/24/2014, 6:09 AM

## 2014-10-24 NOTE — Progress Notes (Signed)
ANTIBIOTIC CONSULT NOTE - Follow up  Pharmacy Consult for Vancomycin / Zosyn Indication: Sepsis  No Known Allergies  Patient Measurements: Height: 6' 3.98" (193 cm) Weight: 163 lb 12.8 oz (74.3 kg) IBW/kg (Calculated) : 86.76  Vital Signs: Temp: 101.1 F (38.4 C) (10/16 0800) Temp Source: Rectal (10/16 0800) BP: 84/60 mmHg (10/16 0700) Pulse Rate: 112 (10/16 0700) Intake/Output from previous day: 10/15 0701 - 10/16 0700 In: 1048 [I.V.:53; NG/GT:175; IV Piggyback:800] Out: 3775 [Urine:3775]  Labs:  Recent Labs  10/22/14 0500 10/22/14 1955 10/23/14 0530 10/24/14 0509  WBC 19.5*  --  18.4* 11.6*  HGB 12.1*  --  12.4* 12.8*  PLT 172  --  160 188  CREATININE 0.69 0.63 0.60* 0.59*   Estimated Creatinine Clearance: 129 mL/min (by C-G formula based on Cr of 0.59).  Recent Labs  10/22/14 1330 10/24/14 0722  VANCOTROUGH 5* 20     Assessment: 85 yoM with hx seizures and debilitating MS with suprapubic catheter presents with grand mal seizure and UTI.  Pharmacy consulted to start vancomycin and ceftazidime initially, but today ceftazidime is changed to Zosyn for anaerobe coverage with possible aspiration.  Anti-infectives 10/12 >> Zosyn x1 10/12 >> Vancomycin >> 10/12 >> Ceftazidime >> 10/13 10/13 >> Zosyn >>  Vitals/Labs Tm 101.8, AF this am. WBC elevated but improved SCr <1, CrCl >100 - overestimated with quadraparesis/bedbound state LA 1.3 > 2.4 > 2.6 10/16: VT = 20mg /L on 1g q8h, within range but likely to accumulate.   Cultures 10/12 bloodx2: ngtd 10/12 urine: mult spcs, suggest recollection 10/12 Influenza panel: negative 10/13 MRSA PCR: positive 10/13 Strep pneumo UAg: negative 10/13 trach asp: ngtd 10/15 trach asp: sent  Goal of Therapy:  Vancomycin trough level 15-20 mcg/ml  Appropriate abx dosing, eradication of infection.   Plan:   Continue Zosyn 3.375g IV Q8H infused over 4hrs.   Change Vanc to 1250mg  IV q12h.   Recheck Vanc trough at  steady state. Follow up renal fxn, culture results, and clinical course.  Romeo Rabon, PharmD, pager [phone]. 10/24/2014,8:32 AM.

## 2014-10-25 ENCOUNTER — Inpatient Hospital Stay (HOSPITAL_COMMUNITY): Payer: Commercial Managed Care - HMO

## 2014-10-25 DIAGNOSIS — J9809 Other diseases of bronchus, not elsewhere classified: Secondary | ICD-10-CM

## 2014-10-25 DIAGNOSIS — T17500A Unspecified foreign body in bronchus causing asphyxiation, initial encounter: Secondary | ICD-10-CM

## 2014-10-25 LAB — BASIC METABOLIC PANEL
Anion gap: 9 (ref 5–15)
BUN: 11 mg/dL (ref 6–20)
CHLORIDE: 113 mmol/L — AB (ref 101–111)
CO2: 20 mmol/L — AB (ref 22–32)
Calcium: 8.7 mg/dL — ABNORMAL LOW (ref 8.9–10.3)
Creatinine, Ser: 0.4 mg/dL — ABNORMAL LOW (ref 0.61–1.24)
GFR calc Af Amer: >60 mL/min (ref >60)
GFR calc non Af Amer: >60 mL/min (ref >60)
Glucose, Bld: 180 mg/dL — ABNORMAL HIGH (ref 65–99)
POTASSIUM: 3.9 mmol/L (ref 3.5–5.1)
SODIUM: 142 mmol/L (ref 135–145)

## 2014-10-25 LAB — CBC WITH DIFFERENTIAL/PLATELET
Basophils Absolute: 0 10*3/uL (ref 0.0–0.1)
Basophils Relative: 0 %
Eosinophils Absolute: 0 10*3/uL (ref 0.0–0.7)
Eosinophils Relative: 0 %
HEMATOCRIT: 32.3 % — AB (ref 39.0–52.0)
HEMOGLOBIN: 11.4 g/dL — AB (ref 13.0–17.0)
LYMPHS ABS: 0.3 10*3/uL — AB (ref 0.7–4.0)
LYMPHS PCT: 3 %
MCH: 30.5 pg (ref 26.0–34.0)
MCHC: 35.3 g/dL (ref 30.0–36.0)
MCV: 86.4 fL (ref 78.0–100.0)
MONOS PCT: 3 %
Monocytes Absolute: 0.3 10*3/uL (ref 0.1–1.0)
NEUTROS ABS: 9.9 10*3/uL — AB (ref 1.7–7.7)
NEUTROS PCT: 94 %
Platelets: 186 10*3/uL (ref 150–400)
RBC: 3.74 MIL/uL — AB (ref 4.22–5.81)
RDW: 12.9 % (ref 11.5–15.5)
WBC: 10.5 10*3/uL (ref 4.0–10.5)

## 2014-10-25 LAB — CULTURE, RESPIRATORY W GRAM STAIN

## 2014-10-25 LAB — GLUCOSE, CAPILLARY
GLUCOSE-CAPILLARY: 145 mg/dL — AB (ref 65–99)
GLUCOSE-CAPILLARY: 192 mg/dL — AB (ref 65–99)
GLUCOSE-CAPILLARY: 160 mg/dL — AB (ref 65–99)
GLUCOSE-CAPILLARY: 145 mg/dL — AB (ref 65–99)
Glucose-Capillary: 163 mg/dL — ABNORMAL HIGH (ref 65–99)
Glucose-Capillary: 165 mg/dL — ABNORMAL HIGH (ref 65–99)
Glucose-Capillary: 163 mg/dL — ABNORMAL HIGH (ref 65–99)

## 2014-10-25 LAB — CULTURE, BLOOD (ROUTINE X 2)
CULTURE: NO GROWTH
Culture: NO GROWTH

## 2014-10-25 LAB — CULTURE, RESPIRATORY

## 2014-10-25 MED ORDER — PRO-STAT SUGAR FREE PO LIQD
30.0000 mL | Freq: Every day | ORAL | Status: DC
  Administered 2014-10-26 – 2014-10-28 (×3): 30 mL
  Filled 2014-10-25 (×3): qty 30

## 2014-10-25 MED ORDER — FREE WATER
200.0000 mL | Freq: Four times a day (QID) | Status: DC
  Administered 2014-10-25 – 2014-10-28 (×9): 200 mL

## 2014-10-25 MED ORDER — SODIUM CHLORIDE 0.45 % IV SOLN
INTRAVENOUS | Status: DC
  Administered 2014-10-25 – 2014-10-27 (×2): via INTRAVENOUS

## 2014-10-25 MED ORDER — INSULIN ASPART 100 UNIT/ML ~~LOC~~ SOLN
0.0000 [IU] | SUBCUTANEOUS | Status: DC
  Administered 2014-10-25: 3 [IU] via SUBCUTANEOUS
  Administered 2014-10-25 – 2014-10-27 (×5): 2 [IU] via SUBCUTANEOUS
  Administered 2014-10-28: 3 [IU] via SUBCUTANEOUS
  Administered 2014-10-28: 2 [IU] via SUBCUTANEOUS
  Administered 2014-10-28 (×2): 3 [IU] via SUBCUTANEOUS
  Administered 2014-10-29: 2 [IU] via SUBCUTANEOUS
  Administered 2014-10-29: 3 [IU] via SUBCUTANEOUS
  Administered 2014-10-30 – 2014-10-31 (×5): 2 [IU] via SUBCUTANEOUS
  Administered 2014-10-31: 3 [IU] via SUBCUTANEOUS
  Administered 2014-10-31 – 2014-11-02 (×5): 2 [IU] via SUBCUTANEOUS

## 2014-10-25 MED ORDER — PREDNISONE 5 MG/ML PO CONC
40.0000 mg | Freq: Every day | ORAL | Status: DC
  Administered 2014-10-25 – 2014-10-28 (×4): 40 mg
  Filled 2014-10-25 (×6): qty 8

## 2014-10-25 NOTE — Progress Notes (Signed)
eLink Physician-Brief Progress Note Patient Name: Terry Palmer DOB: April 15, 1975 MRN: [id number]   Date of Service  10/25/2014  HPI/Events of Note  Request to change patient to ICU status.   eICU Interventions  Will transfer patient to ICU status.      Intervention Category Minor Interventions: Routine modifications to care plan (e.g. PRN medications for pain, fever)  Zoee Heeney Eugene 10/25/2014, 6:27 PM

## 2014-10-25 NOTE — Progress Notes (Signed)
PULMONARY / CRITICAL CARE MEDICINE   Name: Terry Palmer MRN: [id number] DOB: [birth year]    ADMISSION DATE:  10/20/2014 CONSULTATION DATE: 10/13  REFERRING MD :Charlies Silvers  CHIEF COMPLAINT:  Febrile  INITIAL PRESENTATION: 39 yo w MS and significant debilitation including quadroplegia, dysphagia. Admitted with suspected UTI / suprapubic cath infxn. Developed resp distress and hypoxemia 10/13 w inability to manage secretions. At risk for ETT. PCCM consulted to evaluate 10/13.   STUDIES:    SIGNIFICANT EVENTS: 10/12 admitted to hospital 10/13 hypoxic and poor airway compliance, intubated 10/16 Severe vent dyssynchrony this morning, thick secretions   SUBJECTIVE:  Improved vent dyssynchrony Still has thick secretions Off levophed   VITAL SIGNS: Temp:  [97.7 F (36.5 C)-100.2 F (37.9 C)] 98.3 F (36.8 C) (10/17 0801) Pulse Rate:  [39-106] 89 (10/17 0913) Resp:  [0-25] 24 (10/17 0913) BP: (76-117)/(53-86) 90/64 mmHg (10/17 0913) SpO2:  [89 %-100 %] 100 % (10/17 0913) FiO2 (%):  [40 %] 40 % (10/17 0913) Weight:  [71 kg (156 lb 8.4 oz)] 71 kg (156 lb 8.4 oz) (10/17 0500) HEMODYNAMICS:   VENTILATOR SETTINGS: Vent Mode:  [-] PRVC FiO2 (%):  [40 %] 40 % Set Rate:  [24 bmp] 24 bmp Vt Set:  [550 mL] 550 mL PEEP:  [5 cmH20] 5 cmH20 Plateau Pressure:  [16 cmH20-22 cmH20] 17 cmH20 INTAKE / OUTPUT:  Intake/Output Summary (Last 24 hours) at 10/25/14 0955 Last data filed at 10/25/14 0900  Gross per 24 hour  Intake 1943.14 ml  Output   3400 ml  Net -1456.86 ml    PHYSICAL EXAMINATION: General:  On vent HEENT: NCAT ETT in place PULM:  No wheezing today, but no breath sounds on the right on my exam, vent supported breaths CV: RRR, no mgr GI: BS+, soft, nontender Derm: PEG tube in place, suprapubic catheter in place Neuro: awake, opens eyes to voice MSK: multiple muscular contractures  LABS:  CBC  Recent Labs Lab 10/23/14 0530 10/24/14 0509 10/25/14 0530  WBC  18.4* 11.6* 10.5  HGB 12.4* 12.8* 11.4*  HCT 34.4* 34.9* 32.3*  PLT 160 188 186   Coag's  Recent Labs Lab 10/20/14 2210  APTT 34  INR 1.16   BMET  Recent Labs Lab 10/23/14 0530 10/24/14 0509 10/25/14 0530  NA 126* 134* 142  K 3.3* 2.8* 3.9  CL 96* 102 113*  CO2 16* 22 20*  BUN 8 7 11   CREATININE 0.60* 0.59* 0.40*  GLUCOSE 98 126* 180*   Electrolytes  Recent Labs Lab 10/20/14 2210  10/23/14 0530 10/24/14 0509 10/25/14 0530  CALCIUM  --   < > 8.1* 8.7* 8.7*  MG 1.9  --   --   --   --   PHOS 1.8*  --   --   --   --   < > = values in this interval not displayed. Sepsis Markers  Recent Labs Lab 10/20/14 2210  10/21/14 0345 10/21/14 1000 10/21/14 1327  LATICACIDVEN 2.3*  < > 2.4* 0.8 2.6*  PROCALCITON <0.10  --   --   --   --   < > = values in this interval not displayed. ABG  Recent Labs Lab 10/21/14 0920 10/21/14 1245  PHART 7.180* 7.430  PCO2ART 74.6* 34.9*  PO2ART 200* 388*   Liver Enzymes  Recent Labs Lab 10/20/14 1234 10/20/14 1845  AST 25 30  ALT 22 17  ALKPHOS 101 94  BILITOT 0.4 0.6  ALBUMIN 4.6 4.1   Cardiac Enzymes No  results for input(s): TROPONINI, PROBNP in the last 168 hours. Glucose  Recent Labs Lab 10/24/14 1307 10/24/14 1646 10/24/14 2002 10/25/14 0027 10/25/14 0400 10/25/14 0738  GLUCAP 145* 145* 138* 165* 163* 192*    Imaging 10/17 CXR images reviewed, improved aeration of both lungs but some atelectasis R base  ASSESSMENT / PLAN:  PULMONARY A: Hypoxia in setting of poor secretion clearance > persistent today B atelectasis, no clear evidence of pneumonia Asthma exacerbation> improved with steroids 10/17 P:   Change solumedrol to prednisone today Continue bronchodilators Continue full vent support with daily weaning trials  Needs bronchoscopy today May need tracheostomy > hopefully we can extubate after cleaning out secretions and treating asthma Continue pulmonary toilette measures Continue  guaifenesin Chest PT per bed  CARDIOVASCULAR A: Sinus tachycardia in setting of fever, infection> improving Shock> 10/16, likely sedation related, hypovolemia? P:  Telemetry monitoring Start low dose IVF D/c levophed  RENAL Lab Results  Component Value Date   CREATININE 0.40* 10/25/2014   CREATININE 0.59* 10/24/2014   CREATININE 0.60* 10/23/2014  A:   Suprapubic cath> Catheter changed Hyponatremia resolved Hypokalemia > resolved P:   Increase dose free water per PEG Monitor BMET and UOP Replace electrolytes as needed Start 1/2 NS 50cc/hr  GASTROINTESTINAL A:   Peg for Tube feedings Stress ulcer prophylaxis > takes Omeprazole at home P:   Continue osmolite (home tube feed Rx)  PPI  > continue  HEMATOLOGIC A:   Chronic anemia P:  Monitor for bleeding  INFECTIOUS A:   Presumed UTI RLE pressure wound High Fever > resolved P:   BCx2 10/13>> UC 10/12>> multiple species Resp (from suctioning) 10/13 >>   Abx: 10/12 vanc>> stop 10/17 10/12 ceftaz>>10/13 (dc and changed to Zosyn to cover possible aspiration) 10/13 zosyn>> consider stopping 10/19   ENDOCRINE A:  No acute issue  P:    NEUROLOGIC A:  MS with Quadriparesis  Non verbal Metabolic encephalopathic  Szs disorder P:   RASS goal: -1 change to fentanyl gtt, continue versed pushes prn Continue Keppra    FAMILY  - Updates:updated wife by phone 10/17  - Inter-disciplinary family meet or Palliative Care meeting due by:  10/28/14    Independent critical care time is 37 minutes.   Roselie Awkward, MD [place] [facility] Pager: [phone] Cell: [phone] After 3pm or if no response, call [phone]

## 2014-10-25 NOTE — Progress Notes (Signed)
RT assisted MD with bedside bronch (PT remained on Vent)- uneventful. RN aware that a labeled sample is waiting in PT room for appropriate paperwork and sending sample to lab.

## 2014-10-25 NOTE — Progress Notes (Signed)
1545 Fentanyl turned off to do a wean trial, respiratory to do chest PT and assessment. 1700 patient began biting ET tube, called respiratory, RT stated unable to wean today, they will try again tomorrow. Fentanyl resumed at 50mcg

## 2014-10-25 NOTE — Progress Notes (Signed)
Date: October 25, 2014 Chart reviewed for concurrent status and case management needs. Remains on full vent support. Will continue to follow patient for changes and needs: Velva Harman, RN, BSN, [place]   [phone]

## 2014-10-25 NOTE — Procedures (Signed)
PCCM Video Bronchoscopy Procedure Note  The patient was informed of the risks (including but not limited to bleeding, infection, respiratory failure, lung injury, tooth/oral injury) and benefits of the procedure and gave consent, see chart.  Indication: Right lung mucus plugging  Post Procedure Diagnosis: same  Location: [facility] 1240  Condition pre procedure: Critically ill, on vent  Medications for procedure: Fentanyl gtt  Procedure description: The bronchoscope was introduced through the endotracheal tube and passed to the bilateral lungs to the level of the subsegmental bronchi throughout the tracheobronchial tree.  Airway exam revealed normal trachea, sharp carina, normal left tracheobronchial tree without airway lesion.  The right lung however had a thick, grey mucus plug completely occluding the bronchus intermedius.  With saline washing and significant suctioning this was clear and the airway was patent afterwards.  The airways of the right middle lobe and right lower lobe were patent.  Procedures performed:  1) Therapeutic aspiration of the bronchus intermedius  Specimens sent:  1) Bronch wash culture  Condition post procedure:  Critically ill, on vent  EBL: none  Complications: none  Roselie Awkward, MD [place] [facility] Pager: [phone] Cell: [phone] After 3pm or if no response, call [phone]

## 2014-10-25 NOTE — Progress Notes (Signed)
Nutrition Follow-up  INTERVENTION:   Continue Vital 1.5 @ goal rate of 55 ml/hr.   30 ml Prostat daily.   Tube feeding regimen provides 2080 kcal (104% of needs), 104 grams of protein, and 1100 ml of H2O.   RD to continue to monitor  NUTRITION DIAGNOSIS:   Inadequate oral intake related to inability to eat as evidenced by NPO status.  Ongoing.  GOAL:   Patient will meet greater than or equal to 90% of their needs  Meeting.  MONITOR:   Vent status, Labs, Weight trends, Skin, I & O's  ASSESSMENT:   39 year old male with past medical history of multiple sclerosis, neurogenic bladder with chronic indwelling foley catheter, peg tube for nutritional support. patient presented from home with reports of ongoing fevers and worsening upper respiratory tract congestion.   RD to adjust TF order to reflect updated estimated needs.  Patient is currently intubated on ventilator support MV: 13.2 L/min Temp (24hrs), Avg:98.4 F (36.9 C), Min:97.7 F (36.5 C), Max:100.2 F (37.9 C)  Propofol: none  Labs reviewed: CBGs: 163-192 Low Creatinine  Diet Order:     Skin:  Wound (see comment) (Stage II knee and sacral ulcers)  Last BM:  10/12  Height:   Ht Readings from Last 1 Encounters:  10/20/14 6' 3.98" (1.93 m)    Weight:   Wt Readings from Last 1 Encounters:  10/25/14 156 lb 8.4 oz (71 kg)    Ideal Body Weight:  91.8 kg  BMI:  Body mass index is 19.06 kg/(m^2).  Estimated Nutritional Needs:   Kcal:  1997  Protein:  100-110g  Fluid:  2L/day  EDUCATION NEEDS:   No education needs identified at this time  Clayton Bibles, MS, RD, LDN Pager: [phone] After Hours Pager: [phone]

## 2014-10-25 NOTE — Progress Notes (Signed)
   10/25/14 1100  Clinical Encounter Type  Visited With Family  Visit Type Initial;Psychological support;Spiritual support;Critical Care  Referral From Nurse  Consult/Referral To Chaplain  Spiritual Encounters  Spiritual Needs Emotional;Other (Comment) (Pastoral Conversation)  Stress Factors  Patient Stress Factors None identified  Family Stress Factors Health changes   The Chaplain visited with the family of patient Terry Palmer upon request by the Charge Nurse. The nurse stated that the family could use spiritual care.  The mother of the patient was in the waiting room and was receptive to spiritual support. She stated that she understood that her son was in critical condition and that she understood the severity of his condition. She remains hopeful that he can make it out of the ICU. [name] mother has good support from relatives and is emotionally stable, but remains anxious over her son's condition.  [name] mother told the Chaplain that the patient is her only child.  The patient is of the Muslim faith and the mother is Catholic. She is open to follow-up Spiritual Care services. The Chaplain will follow-up with the family and patient at a later time.  [place]

## 2014-10-26 ENCOUNTER — Inpatient Hospital Stay (HOSPITAL_COMMUNITY): Payer: Commercial Managed Care - HMO

## 2014-10-26 DIAGNOSIS — J9809 Other diseases of bronchus, not elsewhere classified: Secondary | ICD-10-CM

## 2014-10-26 DIAGNOSIS — D72829 Elevated white blood cell count, unspecified: Secondary | ICD-10-CM

## 2014-10-26 LAB — BASIC METABOLIC PANEL
ANION GAP: 7 (ref 5–15)
BUN: 15 mg/dL (ref 6–20)
CALCIUM: 8.5 mg/dL — AB (ref 8.9–10.3)
CHLORIDE: 114 mmol/L — AB (ref 101–111)
CO2: 20 mmol/L — AB (ref 22–32)
Creatinine, Ser: 0.42 mg/dL — ABNORMAL LOW (ref 0.61–1.24)
GFR calc Af Amer: >60 mL/min (ref >60)
GFR calc non Af Amer: >60 mL/min (ref >60)
GLUCOSE: 134 mg/dL — AB (ref 65–99)
Potassium: 3.2 mmol/L — ABNORMAL LOW (ref 3.5–5.1)
Sodium: 141 mmol/L (ref 135–145)

## 2014-10-26 LAB — CBC WITH DIFFERENTIAL/PLATELET
BASOS PCT: 0 %
Basophils Absolute: 0 10*3/uL (ref 0.0–0.1)
Eosinophils Absolute: 0 10*3/uL (ref 0.0–0.7)
Eosinophils Relative: 0 %
HEMATOCRIT: 30.5 % — AB (ref 39.0–52.0)
Hemoglobin: 10.6 g/dL — ABNORMAL LOW (ref 13.0–17.0)
LYMPHS ABS: 1.9 10*3/uL (ref 0.7–4.0)
Lymphocytes Relative: 12 %
MCH: 31.1 pg (ref 26.0–34.0)
MCHC: 34.8 g/dL (ref 30.0–36.0)
MCV: 89.4 fL (ref 78.0–100.0)
MONO ABS: 1 10*3/uL (ref 0.1–1.0)
MONOS PCT: 6 %
NEUTROS ABS: 13.2 10*3/uL — AB (ref 1.7–7.7)
Neutrophils Relative %: 82 %
Platelets: 226 10*3/uL (ref 150–400)
RBC: 3.41 MIL/uL — ABNORMAL LOW (ref 4.22–5.81)
RDW: 13.7 % (ref 11.5–15.5)
WBC: 16.1 10*3/uL — ABNORMAL HIGH (ref 4.0–10.5)

## 2014-10-26 LAB — GLUCOSE, CAPILLARY
GLUCOSE-CAPILLARY: 109 mg/dL — AB (ref 65–99)
GLUCOSE-CAPILLARY: 135 mg/dL — AB (ref 65–99)
GLUCOSE-CAPILLARY: 82 mg/dL (ref 65–99)
Glucose-Capillary: 139 mg/dL — ABNORMAL HIGH (ref 65–99)
Glucose-Capillary: 124 mg/dL — ABNORMAL HIGH (ref 65–99)
Glucose-Capillary: 111 mg/dL — ABNORMAL HIGH (ref 65–99)
Glucose-Capillary: 70 mg/dL (ref 65–99)

## 2014-10-26 LAB — CULTURE, RESPIRATORY W GRAM STAIN: Special Requests: NORMAL

## 2014-10-26 LAB — CULTURE, RESPIRATORY: CULTURE: NO GROWTH

## 2014-10-26 MED ORDER — POTASSIUM CHLORIDE 20 MEQ/15ML (10%) PO SOLN
30.0000 meq | ORAL | Status: AC
  Administered 2014-10-26 (×2): 30 meq
  Filled 2014-10-26 (×2): qty 30

## 2014-10-26 MED ORDER — METOCLOPRAMIDE HCL 5 MG/ML IJ SOLN
5.0000 mg | Freq: Four times a day (QID) | INTRAMUSCULAR | Status: AC
  Administered 2014-10-26 – 2014-10-27 (×4): 5 mg via INTRAVENOUS
  Filled 2014-10-26 (×4): qty 2

## 2014-10-26 MED ORDER — FENTANYL CITRATE (PF) 100 MCG/2ML IJ SOLN: 100.0000 ug | INTRAMUSCULAR | Status: DC | PRN

## 2014-10-26 NOTE — Progress Notes (Signed)
PULMONARY / CRITICAL CARE MEDICINE   Name: Terry Palmer MRN: [id number] DOB: [birth year]    ADMISSION DATE:  10/20/2014 CONSULTATION DATE: 10/13  REFERRING MD :Charlies Silvers  CHIEF COMPLAINT:  Febrile  INITIAL PRESENTATION: 39 y/o w MS and significant debilitation including quadroplegia, dysphagia. Admitted with suspected UTI / suprapubic cath infxn. Developed resp distress and hypoxemia 10/13 w inability to manage secretions. At risk for ETT. PCCM consulted to evaluate 10/13.   STUDIES:    SIGNIFICANT EVENTS: 10/12  Admitted to hospital 10/13  Hypoxic and poor airway compliance, intubated 10/16  Severe vent dyssynchrony this morning, thick secretions 10/17  Improved vent synchrony, thick secretions, off levophed.  FOB with R bronchus intermedius mucus plug (removed)   SUBJECTIVE:  10/18  Failed SBT (lasted 10 min, documented as tachypnea but mom reports sedation), thin resp secretions, remains off levo.  Tmax 99.1.  K 3.2, replaced per Elink.     VITAL SIGNS: Temp:  [98.1 F (36.7 C)-99.1 F (37.3 C)] 98.6 F (37 C) (10/18 0914) Pulse Rate:  [89-104] 93 (10/18 0900) Resp:  [21-24] 24 (10/18 0900) BP: (90-120)/(58-79) 100/65 mmHg (10/18 0900) SpO2:  [92 %-100 %] 100 % (10/18 0914) FiO2 (%):  [30 %] 30 % (10/18 1137) Weight:  [164 lb 7.4 oz (74.6 kg)] 164 lb 7.4 oz (74.6 kg) (10/18 0500)   HEMODYNAMICS:     VENTILATOR SETTINGS: Vent Mode:  [-] PRVC FiO2 (%):  [30 %] 30 % Set Rate:  [24 bmp] 24 bmp Vt Set:  [550 mL] 550 mL PEEP:  [5 cmH20] 5 cmH20 Plateau Pressure:  [17 cmH20-32 cmH20] 32 cmH20   INTAKE / OUTPUT:  Intake/Output Summary (Last 24 hours) at 10/26/14 1211 Last data filed at 10/26/14 0900  Gross per 24 hour  Intake   2687 ml  Output   1625 ml  Net   1062 ml    PHYSICAL EXAMINATION: General:  Chronically ill appearing male in NAD on vent  HEENT: NCAT ETT in place PULM:  Even/non-labored on vent, improved air movement, coarse breath sounds CV: RRR,  no mgr GI: BS+, soft, nontender Derm: PEG tube in place, suprapubic catheter in place Neuro: sedate MSK: multiple muscular contractures  LABS:  CBC  Recent Labs Lab 10/24/14 0509 10/25/14 0530 10/26/14 0525  WBC 11.6* 10.5 16.1*  HGB 12.8* 11.4* 10.6*  HCT 34.9* 32.3* 30.5*  PLT 188 186 226   Coag's  Recent Labs Lab 10/20/14 2210  APTT 34  INR 1.16   BMET  Recent Labs Lab 10/24/14 0509 10/25/14 0530 10/26/14 0525  NA 134* 142 141  K 2.8* 3.9 3.2*  CL 102 113* 114*  CO2 22 20* 20*  BUN 7 11 15   CREATININE 0.59* 0.40* 0.42*  GLUCOSE 126* 180* 134*   Electrolytes  Recent Labs Lab 10/20/14 2210  10/24/14 0509 10/25/14 0530 10/26/14 0525  CALCIUM  --   < > 8.7* 8.7* 8.5*  MG 1.9  --   --   --   --   PHOS 1.8*  --   --   --   --   < > = values in this interval not displayed.   Sepsis Markers  Recent Labs Lab 10/20/14 2210  10/21/14 0345 10/21/14 1000 10/21/14 1327  LATICACIDVEN 2.3*  < > 2.4* 0.8 2.6*  PROCALCITON <0.10  --   --   --   --   < > = values in this interval not displayed.   ABG  Recent Labs Lab  10/21/14 0920 10/21/14 1245  PHART 7.180* 7.430  PCO2ART 74.6* 34.9*  PO2ART 200* 388*   Liver Enzymes  Recent Labs Lab 10/20/14 1234 10/20/14 1845  AST 25 30  ALT 22 17  ALKPHOS 101 94  BILITOT 0.4 0.6  ALBUMIN 4.6 4.1   Cardiac Enzymes No results for input(s): TROPONINI, PROBNP in the last 168 hours.   Glucose  Recent Labs Lab 10/25/14 1222 10/25/14 1634 10/25/14 2021 10/25/14 2352 10/26/14 0359 10/26/14 0745  GLUCAP 160* 163* 145* 135* 139* 124*    Imaging 10/18 CXR images reviewed, improved aeration of both lungs but some atelectasis R base  ASSESSMENT / PLAN:  PULMONARY A: Hypoxia in setting of poor secretion clearance - improved post bronch on 10/17 B atelectasis, no clear evidence of pneumonia Asthma exacerbation - improved with steroids 10/17 P:   Prednisone per tube Continue bronchodilators MV  support with daily SBT / WUA  May need tracheostomy > hopefully we can extubate after cleaning out secretions and treating asthma Continue guaifenesin Chest PT per bed Trend CXR   CARDIOVASCULAR A: Sinus tachycardia in setting of fever, infection - resolved.  Shock - 10/16, likely sedation related, hypovolemia?  Resolved.   P:  Telemetry monitoring 0.45 NS @ 50 ml/hr Qtc assessed prior to initiation of reglan, 0.38  RENAL A:   Suprapubic cath - Catheter changed Hyponatremia - resolved Hypokalemia   P:   Free water per PEG, 200 Q6 Monitor BMET and UOP Replace electrolytes as needed 1/2 NS 50cc/hr  GASTROINTESTINAL A:   Diarrhea - resolved  Chronic Peg for Tube  Stress ulcer prophylaxis > takes Omeprazole at home P:   Continue osmolite (home tube feed Rx) >> HOLD due to abdominal distention  PPI   Reglan x4 doses  PEG to suction  HEMATOLOGIC A:   Chronic anemia P:  Monitor for bleeding Trend CBC  INFECTIOUS A:   Presumed UTI RLE pressure wound High Fever -  resolved P:   BCx2 10/13 >> UC 10/12 >> multiple species Resp (from suctioning) 10/13 >> neg BAL (from FOB) 10/17 >>   Abx: 10/12 vanc >> stop 10/17 10/12 ceftaz >> 10/13 (dc and changed to Zosyn to cover possible aspiration) 10/13 zosyn >> 10/19  Monitor off abx   ENDOCRINE A:  Hyperglycemia  P:   SSI   NEUROLOGIC A:  MS with Quadriparesis  Non verbal Metabolic encephalopathic  Szs disorder P:   RASS goal: 0 Discontinue fentanyl gtt PRN fentanyl / versed Avoid oversedation, hopeful to re-attempt SBT pm 10/18 Continue Keppra    FAMILY  - Updates:  Mother updated at bedside 10/18  - Inter-disciplinary family meet or Palliative Care meeting due by:  10/28/14   Noe Gens, NP-C [facility] Pgr: [phone] or if no answer [phone] 10/26/2014, 12:11 PM

## 2014-10-26 NOTE — Progress Notes (Signed)
Attempted to wean patient on PS of 15 after over 12 hours off of sedation and patient went apneic. Returned patient to full support and patient is tolerating it well.

## 2014-10-26 NOTE — Progress Notes (Signed)
[facility] Nursing ICU Electrolyte Replacement Protocol  Patient Name: Taeveon E Nilsen DOB: 07/25/1975 MRN: [id number]  Date of Service  10/26/2014   HPI/Events of Note    Recent Labs Lab 10/20/14 2210  10/22/14 1955 10/23/14 0530 10/24/14 0509 10/25/14 0530 10/26/14 0525  NA  --   < > 124* 126* 134* 142 141  K  --   < > 2.9* 3.3* 2.8* 3.9 3.2*  CL  --   < > 95* 96* 102 113* 114*  CO2  --   < > 17* 16* 22 20* 20*  GLUCOSE  --   < > 88 98 126* 180* 134*  BUN  --   < > 8 8 7 11 15  CREATININE  --   < > 0.63 0.60* 0.59* 0.40* 0.42*  CALCIUM  --   < > 8.0* 8.1* 8.7* 8.7* 8.5*  MG 1.9  --   --   --   --   --   --   PHOS 1.8*  --   --   --   --   --   --   < > = values in this interval not displayed.  Estimated Creatinine Clearance: 129.5 mL/min (by C-G formula based on Cr of 0.42).  Intake/Output      10/17 0701 - 10/18 0700   I.V. (mL/kg) 1082.3 (14.5)   Other 40   NG/GT 1465   IV Piggyback 152   Total Intake(mL/kg) 2739.3 (36.7)   Urine (mL/kg/hr) 1500 (0.8)   Drains 0 (0)   Total Output 1500   Net +1239.3        - I/O DETAILED x24h    Total I/O In: 1707.5 [I.V.:627.5; Other:40; NG/GT:990; IV Piggyback:50] Out: 650 [Urine:650] - I/O THIS SHIFT    ASSESSMENT   eICURN Interventions   Electrolyte protocol criteria met. Irregular labs replaced per protocol. MD notified.   ASSESSMENT: MAJOR ELECTROLYTE    ,  Nicole 10/26/2014, 6:35 AM     

## 2014-10-26 NOTE — Progress Notes (Signed)
Wasted remainder of Fentanyl 72mL in the sink, witnessed by [employer]

## 2014-10-27 ENCOUNTER — Inpatient Hospital Stay (HOSPITAL_COMMUNITY): Payer: Commercial Managed Care - HMO

## 2014-10-27 DIAGNOSIS — T883XXA Malignant hyperthermia due to anesthesia, initial encounter: Secondary | ICD-10-CM

## 2014-10-27 LAB — GLUCOSE, CAPILLARY
GLUCOSE-CAPILLARY: 110 mg/dL — AB (ref 65–99)
Glucose-Capillary: 68 mg/dL (ref 65–99)
Glucose-Capillary: 103 mg/dL — ABNORMAL HIGH (ref 65–99)
Glucose-Capillary: 64 mg/dL — ABNORMAL LOW (ref 65–99)
Glucose-Capillary: 149 mg/dL — ABNORMAL HIGH (ref 65–99)
Glucose-Capillary: 73 mg/dL (ref 65–99)
Glucose-Capillary: 96 mg/dL (ref 65–99)
Glucose-Capillary: 123 mg/dL — ABNORMAL HIGH (ref 65–99)

## 2014-10-27 LAB — BASIC METABOLIC PANEL
Anion gap: 12 (ref 5–15)
BUN: 19 mg/dL (ref 6–20)
CHLORIDE: 105 mmol/L (ref 101–111)
CO2: 20 mmol/L — ABNORMAL LOW (ref 22–32)
CREATININE: 0.58 mg/dL — AB (ref 0.61–1.24)
Calcium: 8.8 mg/dL — ABNORMAL LOW (ref 8.9–10.3)
GFR calc Af Amer: >60 mL/min (ref >60)
GFR calc non Af Amer: >60 mL/min (ref >60)
Glucose, Bld: 121 mg/dL — ABNORMAL HIGH (ref 65–99)
Potassium: 3.6 mmol/L (ref 3.5–5.1)
SODIUM: 137 mmol/L (ref 135–145)

## 2014-10-27 LAB — CBC
HEMATOCRIT: 32.2 % — AB (ref 39.0–52.0)
HEMOGLOBIN: 10.9 g/dL — AB (ref 13.0–17.0)
MCH: 31.1 pg (ref 26.0–34.0)
MCHC: 33.9 g/dL (ref 30.0–36.0)
MCV: 91.7 fL (ref 78.0–100.0)
Platelets: 293 10*3/uL (ref 150–400)
RBC: 3.51 MIL/uL — ABNORMAL LOW (ref 4.22–5.81)
RDW: 14.3 % (ref 11.5–15.5)
WBC: 23.6 10*3/uL — ABNORMAL HIGH (ref 4.0–10.5)

## 2014-10-27 MED ORDER — ACETAMINOPHEN 160 MG/5ML PO SOLN
650.0000 mg | Freq: Four times a day (QID) | ORAL | Status: DC | PRN
  Administered 2014-10-27 – 2014-10-28 (×3): 650 mg
  Filled 2014-10-27 (×3): qty 20.3

## 2014-10-27 MED ORDER — DEXTROSE 50 % IV SOLN
INTRAVENOUS | Status: AC
  Administered 2014-10-27: 25 mL
  Filled 2014-10-27: qty 50

## 2014-10-27 MED ORDER — VITAL AF 1.2 CAL PO LIQD
1000.0000 mL | ORAL | Status: DC
  Filled 2014-10-27: qty 1000

## 2014-10-27 MED ORDER — FENTANYL CITRATE (PF) 100 MCG/2ML IJ SOLN: 12.5000 ug | INTRAMUSCULAR | Status: DC | PRN

## 2014-10-27 MED ORDER — DEXTROSE 50 % IV SOLN
INTRAVENOUS | Status: AC
  Administered 2014-10-27: 50 mL
  Filled 2014-10-27: qty 50

## 2014-10-27 MED ORDER — CHLORHEXIDINE GLUCONATE 0.12 % MT SOLN
15.0000 mL | Freq: Two times a day (BID) | OROMUCOSAL | Status: DC
  Administered 2014-10-27 – 2014-11-02 (×12): 15 mL via OROMUCOSAL
  Filled 2014-10-27 (×9): qty 15

## 2014-10-27 MED ORDER — OSMOLITE 1.5 CAL PO LIQD: 1000.0000 mL | ORAL | Status: DC

## 2014-10-27 MED ORDER — IOHEXOL 300 MG/ML  SOLN
50.0000 mL | Freq: Once | INTRAMUSCULAR | Status: DC | PRN
  Administered 2014-10-27: 50 mL via ORAL
  Filled 2014-10-27: qty 50

## 2014-10-27 MED ORDER — CETYLPYRIDINIUM CHLORIDE 0.05 % MT LIQD
7.0000 mL | Freq: Two times a day (BID) | OROMUCOSAL | Status: DC
  Administered 2014-10-27 – 2014-11-02 (×12): 7 mL via OROMUCOSAL

## 2014-10-27 MED ORDER — OSMOLITE 1.5 CAL PO LIQD
1000.0000 mL | ORAL | Status: DC
  Administered 2014-10-27: 1000 mL
  Filled 2014-10-27 (×2): qty 1000

## 2014-10-27 NOTE — Progress Notes (Signed)
Hypoglycemic Event  CBG: 68  Treatment: D50 IV 25 mL  Symptoms: None  Follow-up CBG: [id number] CBG Result:103  Possible Reasons for Event: Other: patient NPO/tube feeds stopped  Comments/MD notified:will continue to monitor    Terry Palmer, Coralee Pesa

## 2014-10-27 NOTE — Plan of Care (Signed)
Problem: Phase II Progression Outcomes Goal: Date pt extubated/weaned off vent Outcome: Completed/Met Date Met:  10/27/14 10-27-2014 Goal: Time pt extubated/weaned off vent Outcome: Completed/Met Date Met:  10/27/14 12:00

## 2014-10-27 NOTE — Progress Notes (Signed)
S:  Called by RN, patient accidentally dislodged PEG tube.  PEG tube intact, balloon intact.   O:  PEG site draining mucus.  Area cleaned.  PEG tube coated with lubricating jelly and gently reinserted.  No resistance met during reinsertion.  Patient tolerated well.  Wife at bedside and reports the home health nurses change the PEG Q3 months at home.    A: Dislodged PEG   P:  PEG reinserted without difficulty  Assess KUB to ensure proper placement  HOLD TF for now until proper placement can be confirmed    Noe Gens, NP-C [facility] Pgr: [phone] or if no answer [phone] 10/27/2014, 4:03 PM

## 2014-10-27 NOTE — Progress Notes (Signed)
Patient's mother accidentally pulled out peg tube.  Notified Dr. Lake Bells. No bleeding or tearing at the site.

## 2014-10-27 NOTE — Procedures (Signed)
Extubation Procedure Note  Patient Details:   Name: ACEL NATZKE DOB: 25-Feb-1975 MRN: [id number]   Airway Documentation:  Airway 7.5 mm (Active)  Secured at (cm) 26 cm 10/27/2014  9:39 AM  Measured From Lips 10/27/2014  9:39 AM  [place] 10/27/2014  3:24 AM  Secured By [employer] 10/27/2014  9:39 AM  Tube Holder Repositioned Yes 10/27/2014  9:39 AM  Cuff Pressure (cm H2O) 24 cm H2O 10/26/2014 11:47 PM  Site Condition Dry 10/27/2014  3:24 AM    Evaluation  O2 sats: 96 Complications: none Patient tolerated procedure well. Bilateral Breath Sounds: Rhonchi, Diminished Suctioning: Airway  Per CCM order, pt extubated, placed on 3L nasal cannula.  Pt tolerated well, no complications.   Martha Clan 10/27/2014, 12:07 PM

## 2014-10-27 NOTE — Progress Notes (Signed)
Hypoglycemic Event  CBG: 64  Treatment: D50 IV 50 mL  Symptoms: None  Follow-up CBG: Time:0525 CBG Result:149  Possible Reasons for Event: Other: patient NPO/tube feedings stopped  Comments/MD notified:will continue to monitor    Terry Palmer, Coralee Pesa

## 2014-10-27 NOTE — Progress Notes (Signed)
PULMONARY / CRITICAL CARE MEDICINE   Name: Terry Palmer MRN: [id number] DOB: November 02, 1975    ADMISSION DATE:  10/20/2014 CONSULTATION DATE: 10/13  REFERRING MD :Charlies Silvers  CHIEF COMPLAINT:  Febrile  INITIAL PRESENTATION: 39 y/o w MS and significant debilitation including quadroplegia, dysphagia. Admitted with suspected UTI / suprapubic cath infxn. Developed resp distress and hypoxemia 10/13 w inability to manage secretions. At risk for ETT. PCCM consulted to evaluate 10/13.   STUDIES:    SIGNIFICANT EVENTS: 10/12  Admitted to hospital 10/13  Hypoxic and poor airway compliance, intubated 10/16  Severe vent dyssynchrony this morning, thick secretions 10/17  Improved vent synchrony, thick secretions, off levophed.  FOB with R bronchus intermedius mucus plug (removed)   SUBJECTIVE:  Passing SBT, tube feedings on hold   VITAL SIGNS: Temp:  [98 F (36.7 C)-101.7 F (38.7 C)] 101.7 F (38.7 C) (10/19 0800) Pulse Rate:  [85-96] 96 (10/19 0324) Resp:  [19-25] 24 (10/19 0700) BP: (104-137)/(68-90) 134/86 mmHg (10/19 0700) SpO2:  [98 %-100 %] 98 % (10/19 0931) FiO2 (%):  [30 %] 30 % (10/19 0939) Weight:  [165 lb 12.6 oz (75.2 kg)] 165 lb 12.6 oz (75.2 kg) (10/19 0500)   HEMODYNAMICS:     VENTILATOR SETTINGS: Vent Mode:  [-] CPAP;PSV FiO2 (%):  [30 %] 30 % Set Rate:  [24 bmp] 24 bmp Vt Set:  [550 mL] 550 mL PEEP:  [5 cmH20] 5 cmH20 Pressure Support:  [5 cmH20] 5 cmH20 Plateau Pressure:  [17 cmH20-32 cmH20] 19 cmH20   INTAKE / OUTPUT:  Intake/Output Summary (Last 24 hours) at 10/27/14 1132 Last data filed at 10/27/14 0748  Gross per 24 hour  Intake   1223 ml  Output   1430 ml  Net   -207 ml    PHYSICAL EXAMINATION: General:  Chronically ill appearing male in NAD on vent  HEENT: NCAT ETT in place PULM:  CTA on vent, vent supported breaths CV: RRR, no mgr GI: distended markedly, nontender, BS+, soft, nontender Derm: PEG tube in place, suprapubic catheter in  place Neuro: sedate MSK: multiple muscular contractures unchanged  LABS:  CBC  Recent Labs Lab 10/25/14 0530 10/26/14 0525 10/27/14 0543  WBC 10.5 16.1* 23.6*  HGB 11.4* 10.6* 10.9*  HCT 32.3* 30.5* 32.2*  PLT 186 226 293   Coag's  Recent Labs Lab 10/20/14 2210  APTT 34  INR 1.16   BMET  Recent Labs Lab 10/25/14 0530 10/26/14 0525 10/27/14 0543  NA 142 141 137  K 3.9 3.2* 3.6  CL 113* 114* 105  CO2 20* 20* 20*  BUN 11 15 19   CREATININE 0.40* 0.42* 0.58*  GLUCOSE 180* 134* 121*   Electrolytes  Recent Labs Lab 10/20/14 2210  10/25/14 0530 10/26/14 0525 10/27/14 0543  CALCIUM  --   < > 8.7* 8.5* 8.8*  MG 1.9  --   --   --   --   PHOS 1.8*  --   --   --   --   < > = values in this interval not displayed.   Sepsis Markers  Recent Labs Lab 10/20/14 2210  10/21/14 0345 10/21/14 1000 10/21/14 1327  LATICACIDVEN 2.3*  < > 2.4* 0.8 2.6*  PROCALCITON <0.10  --   --   --   --   < > = values in this interval not displayed.   ABG  Recent Labs Lab 10/21/14 0920 10/21/14 1245  PHART 7.180* 7.430  PCO2ART 74.6* 34.9*  PO2ART 200* 388*  Liver Enzymes  Recent Labs Lab 10/20/14 1234 10/20/14 1845  AST 25 30  ALT 22 17  ALKPHOS 101 94  BILITOT 0.4 0.6  ALBUMIN 4.6 4.1   Cardiac Enzymes No results for input(s): TROPONINI, PROBNP in the last 168 hours.   Glucose  Recent Labs Lab 10/26/14 2325 10/27/14 0055 10/27/14 0132 10/27/14 0411 10/27/14 0525 10/27/14 0736  GLUCAP 70 68 103* 64* 149* 73    Imaging 10/19 CXR images reviewed, good aeration both lungs 10/18 KUB> colonic ileus  ASSESSMENT / PLAN:  PULMONARY A: Hypoxia in setting of poor secretion clearance - improved post bronch on 10/17  Asthma exacerbation - improved with steroids 10/17 P:   Prednisone per tube > wean Continue bronchodilators Extubation today> wife and mother understand it is high risk, may need to be re-intubated Continue guaifenesin Chest PT per  bed Trend CXR   CARDIOVASCULAR A: Shock -  Resolved P:  Telemetry monitoring 0.45 NS @ 50 ml/hr Qtc assessed prior to initiation of reglan, 0.38  RENAL A:   Suprapubic cath - Catheter changed Hyponatremia - resolved P:   Free water per PEG, 200 Q6 Monitor BMET and UOP Replace electrolytes as needed Stop IVF  GASTROINTESTINAL A:   Chronic Peg for Tube  Stress ulcer prophylaxis > takes Omeprazole at home P:   Continue osmolite (home tube feed Rx) >> restart today PPI    HEMATOLOGIC A:   Chronic anemia P:  Monitor for bleeding Trend CBC  INFECTIOUS A:   Presumed UTI RLE pressure wound Fever recurrent 06/00, source uncertain, not septic based on vitals; drug fever, neurogenic? P:   BCx2 10/13 >> UC 10/12 >> multiple species Resp (from suctioning) 10/13 >> neg BAL (from FOB) 10/17 >>   Abx: 10/12 vanc >> stop 10/17 10/12 ceftaz >> 10/13 (dc and changed to Zosyn to cover possible aspiration) 10/13 zosyn >> 10/18  APAP prn fever, monitor off of IVF Remove CVL Continue wound care  ENDOCRINE A:  Hyperglycemia  P:   SSI   NEUROLOGIC A:  MS with Quadriparesis  Non verbal Metabolic encephalopathic  Szs disorder P:   Continue Keppra Continue home medications for muscle spasms    FAMILY  - Updates:  Mother updated at bedside 10/19  - Inter-disciplinary family meet or Palliative Care meeting due by:  10/28/14   My cc time 35 minutes  Roselie Awkward, MD [place] PCCM Pager: [phone] Cell: [phone] After 3pm or if no response, call [phone]

## 2014-10-28 ENCOUNTER — Inpatient Hospital Stay (HOSPITAL_COMMUNITY): Payer: Commercial Managed Care - HMO

## 2014-10-28 LAB — GLUCOSE, CAPILLARY
GLUCOSE-CAPILLARY: 100 mg/dL — AB (ref 65–99)
GLUCOSE-CAPILLARY: 100 mg/dL — AB (ref 65–99)
Glucose-Capillary: 109 mg/dL — ABNORMAL HIGH (ref 65–99)
Glucose-Capillary: 151 mg/dL — ABNORMAL HIGH (ref 65–99)
Glucose-Capillary: 166 mg/dL — ABNORMAL HIGH (ref 65–99)
Glucose-Capillary: 160 mg/dL — ABNORMAL HIGH (ref 65–99)

## 2014-10-28 LAB — BASIC METABOLIC PANEL
ANION GAP: 9 (ref 5–15)
BUN: 12 mg/dL (ref 6–20)
CHLORIDE: 102 mmol/L (ref 101–111)
CO2: 24 mmol/L (ref 22–32)
Calcium: 8.7 mg/dL — ABNORMAL LOW (ref 8.9–10.3)
Creatinine, Ser: 0.48 mg/dL — ABNORMAL LOW (ref 0.61–1.24)
GFR calc Af Amer: >60 mL/min (ref >60)
GLUCOSE: 102 mg/dL — AB (ref 65–99)
POTASSIUM: 3.3 mmol/L — AB (ref 3.5–5.1)
Sodium: 135 mmol/L (ref 135–145)

## 2014-10-28 LAB — CBC WITH DIFFERENTIAL/PLATELET
BASOS ABS: 0 10*3/uL (ref 0.0–0.1)
BASOS PCT: 0 %
EOS PCT: 1 %
Eosinophils Absolute: 0.2 10*3/uL (ref 0.0–0.7)
HEMATOCRIT: 33.6 % — AB (ref 39.0–52.0)
Hemoglobin: 11.4 g/dL — ABNORMAL LOW (ref 13.0–17.0)
LYMPHS PCT: 22 %
Lymphs Abs: 3.8 10*3/uL (ref 0.7–4.0)
MCH: 30.9 pg (ref 26.0–34.0)
MCHC: 33.9 g/dL (ref 30.0–36.0)
MCV: 91.1 fL (ref 78.0–100.0)
MONOS PCT: 11 %
Monocytes Absolute: 1.9 10*3/uL — ABNORMAL HIGH (ref 0.1–1.0)
NEUTROS ABS: 11.5 10*3/uL — AB (ref 1.7–7.7)
Neutrophils Relative %: 66 %
PLATELETS: 334 10*3/uL (ref 150–400)
RBC: 3.69 MIL/uL — AB (ref 4.22–5.81)
RDW: 14.2 % (ref 11.5–15.5)
WBC: 17.4 10*3/uL — ABNORMAL HIGH (ref 4.0–10.5)

## 2014-10-28 LAB — CULTURE, RESPIRATORY
CULTURE: NO GROWTH
SPECIAL REQUESTS: NORMAL

## 2014-10-28 LAB — CULTURE, RESPIRATORY W GRAM STAIN

## 2014-10-28 MED ORDER — OSMOLITE 1.5 CAL PO LIQD
1000.0000 mL | ORAL | Status: DC
  Filled 2014-10-28: qty 1000

## 2014-10-28 MED ORDER — OSMOLITE 1.5 CAL PO LIQD
1000.0000 mL | ORAL | Status: DC
  Administered 2014-10-28 – 2014-10-30 (×3): 1000 mL
  Filled 2014-10-28 (×7): qty 1000

## 2014-10-28 MED ORDER — POTASSIUM CHLORIDE 20 MEQ/15ML (10%) PO SOLN
20.0000 meq | ORAL | Status: AC
Start: 2014-10-28 — End: 2014-10-28
  Administered 2014-10-28 (×2): 20 meq
  Filled 2014-10-28 (×2): qty 15

## 2014-10-28 MED ORDER — FREE WATER: 200.0000 mL | Freq: Three times a day (TID) | Status: DC

## 2014-10-28 MED ORDER — PREDNISONE 5 MG/ML PO CONC
20.0000 mg | Freq: Every day | ORAL | Status: DC
  Administered 2014-10-29 – 2014-11-02 (×5): 20 mg
  Filled 2014-10-28 (×7): qty 4

## 2014-10-28 MED ORDER — FREE WATER
100.0000 mL | Freq: Four times a day (QID) | Status: DC
  Administered 2014-10-28 – 2014-11-02 (×20): 100 mL

## 2014-10-28 NOTE — Progress Notes (Addendum)
PULMONARY / CRITICAL CARE MEDICINE   Name: Terry Palmer MRN: [id number] DOB: October 29, 1975    ADMISSION DATE:  10/20/2014 CONSULTATION DATE: 10/13  REFERRING MD :Charlies Silvers  CHIEF COMPLAINT:  Febrile  INITIAL PRESENTATION: 39 y/o w MS and significant debilitation including quadroplegia, dysphagia. Admitted with suspected UTI / suprapubic cath infxn. Developed resp distress and hypoxemia 10/13 w inability to manage secretions. At risk for ETT. PCCM consulted to evaluate 10/13.   STUDIES:    SIGNIFICANT EVENTS: 10/12  Admitted to hospital 10/13  Hypoxic and poor airway compliance, intubated 10/16  Severe vent dyssynchrony this morning, thick secretions 10/17  Improved vent synchrony, thick secretions, off levophed.  FOB with R bronchus intermedius mucus plug (removed)  10/19 Extubated, PEG dislodged then replaced  SUBJECTIVE:  Extubated, PEG dislodged then replaced, fever yesterday   VITAL SIGNS: Temp:  [97.9 F (36.6 C)-102 F (38.9 C)] 99.1 F (37.3 C) (10/20 0329) Pulse Rate:  [101-102] 102 (10/20 0247) Resp:  [15-24] 19 (10/20 0600) BP: (86-146)/(62-89) 122/87 mmHg (10/20 0600) SpO2:  [96 %-100 %] 99 % (10/20 0600) FiO2 (%):  [30 %] 30 % (10/19 1200) Weight:  [72.3 kg (159 lb 6.3 oz)] 72.3 kg (159 lb 6.3 oz) (10/20 0400)   HEMODYNAMICS:     VENTILATOR SETTINGS: Vent Mode:  [-] CPAP;PSV FiO2 (%):  [30 %] 30 % PEEP:  [5 cmH20] 5 cmH20 Pressure Support:  [5 cmH20] 5 cmH20   INTAKE / OUTPUT:  Intake/Output Summary (Last 24 hours) at 10/28/14 0741 Last data filed at 10/28/14 0700  Gross per 24 hour  Intake  789.5 ml  Output   2600 ml  Net -1810.5 ml    PHYSICAL EXAMINATION: General:  Chronically ill appearing male in NAD HEENT: NCAT OP clear PULM:  CTA bilaterally CV: RRR, no mgr GI: distension improved, nontender, BS+, soft, nontender Derm: PEG tube in place, suprapubic catheter in place Neuro: Awake, minimal muscular movement, tracks with eyes MSK:  multiple muscular contractures unchanged  LABS:  CBC  Recent Labs Lab 10/26/14 0525 10/27/14 0543 10/28/14 0500  WBC 16.1* 23.6* 17.4*  HGB 10.6* 10.9* 11.4*  HCT 30.5* 32.2* 33.6*  PLT 226 293 334   Coag's No results for input(s): APTT, INR in the last 168 hours. BMET  Recent Labs Lab 10/26/14 0525 10/27/14 0543 10/28/14 0500  NA 141 137 135  K 3.2* 3.6 3.3*  CL 114* 105 102  CO2 20* 20* 24  BUN 15 19 12   CREATININE 0.42* 0.58* 0.48*  GLUCOSE 134* 121* 102*   Electrolytes  Recent Labs Lab 10/26/14 0525 10/27/14 0543 10/28/14 0500  CALCIUM 8.5* 8.8* 8.7*     Sepsis Markers  Recent Labs Lab 10/21/14 1000 10/21/14 1327  LATICACIDVEN 0.8 2.6*     ABG  Recent Labs Lab 10/21/14 0920 10/21/14 1245  PHART 7.180* 7.430  PCO2ART 74.6* 34.9*  PO2ART 200* 388*   Liver Enzymes No results for input(s): AST, ALT, ALKPHOS, BILITOT, ALBUMIN in the last 168 hours. Cardiac Enzymes No results for input(s): TROPONINI, PROBNP in the last 168 hours.   Glucose  Recent Labs Lab 10/27/14 0736 10/27/14 1148 10/27/14 1637 10/27/14 1943 10/27/14 2332 10/28/14 0325  GLUCAP 73 96 110* 123* 100* 100*    Imaging 10/20 CXR > clear lungs, low lung volumes 10/19 KUB> PEG in place 10/19 CXR images reviewed, good aeration both lungs 10/18 KUB> colonic ileus  ASSESSMENT / PLAN:  PULMONARY A: Hypoxia in setting of poor secretion clearance - improved post  bronch on 10/17, now resolved Asthma exacerbation - improved with steroids 10/17 P:   Prednisone per tube > wean off over 5 days Continue Xopenex q6h, maintain this at discharge Continue aspiration precautions Continue guaifenesin Chest PT per bed Trend CXR   CARDIOVASCULAR A: Shock -  Resolved P:  Telemetry monitoring KVO fluids Remove CVL  RENAL A:   Suprapubic cath - Catheter changed this admission Hyponatremia - resolved P:   Free water per PEG, 200 Q8 Monitor BMET and UOP Replace  electrolytes as needed Stop IVF  GASTROINTESTINAL A:   Chronic Peg for Tube  Stress ulcer prophylaxis > takes Omeprazole at home P:   Continue osmolite (home tube feed Rx) >> will decrease rate to 30cc/hr given aspiration risk and will ask the dietician to see him> is there something else we can give at a low rate that will meet his metabolic/nutritional needs? PPI    HEMATOLOGIC A:   Chronic anemia P:  Monitor for bleeding Trend CBC  INFECTIOUS A:   Presumed UTI RLE pressure wound Fever recurrent 73/56, source uncertain, not septic based on vitals; drug fever, neurogenic? P:   BCx2 10/13 >> neg UC 10/12 >> multiple species Resp (from suctioning) 10/13 >> neg  BAL (from FOB) 10/17 >>  No growth  Abx: 10/12 vanc >> stop 10/17 10/12 ceftaz >> 10/13 (dc and changed to Zosyn to cover possible aspiration) 10/13 zosyn >> 10/18  APAP prn fever, monitor off of IVF Remove CVL Continue wound care  ENDOCRINE A:  Hyperglycemia  P:   SSI   NEUROLOGIC A:  MS with Quadriparesis  Non verbal Metabolic encephalopathic  Szs disorder P:   Continue Keppra Continue home medications for muscle spasms    FAMILY  - Updates:  Mother updated at bedside 10/19  - Inter-disciplinary family meet or Palliative Care meeting due by:  10/28/14   Move to SDU status, triad, PCCM off as of 10/21  Roselie Awkward, MD [place] PCCM Pager: [phone] Cell: [phone] After 3pm or if no response, call [phone]

## 2014-10-28 NOTE — Care Management Note (Signed)
Case Management Note  Patient Details  Name: Terry Palmer MRN: [id number] Date of Birth: Oct 17, 1975  Subjective/Objective:                    Action/Plan:   Expected Discharge Date:                  Expected Discharge Plan:  Home/Self Care  In-House Referral:  NA  Discharge planning Services  CM Consult  Post Acute Care Choice:  NA Choice offered to:  NA  DME Arranged:  N/A DME Agency:  NA  HH Arranged:  NA HH Agency:  NA  Status of Service:  In process, will continue to follow  Medicare Important Message Given:    Date Medicare IM Given:    Medicare IM give by:    Date Additional Medicare IM Given:    Additional Medicare Important Message give by:     If discussed at [place] of Stay Meetings, dates discussed:  [id number]  Additional Comments:  Leeroy Cha, RN 10/28/2014, 10:29 AM

## 2014-10-28 NOTE — Progress Notes (Signed)
Date: October 28, 2014 Chart reviewed for concurrent status and case management needs. Will continue to follow patient for changes and needs: Remains on full vent support unable to wean. Velva Harman, RN, BSN, [place]   [phone]

## 2014-10-28 NOTE — Progress Notes (Signed)
Nutrition Follow-up  DOCUMENTATION CODES:   Non-severe (moderate) malnutrition in context of chronic illness  INTERVENTION:  - Will order Osmolite 1.5 @ 50 mL/hr with 100 mL free water Q6h to provide 1800 kcal, 75 grams protein, and 1314 mL free water. - RD will continue to monitor for needs  NUTRITION DIAGNOSIS:   Inadequate oral intake related to inability to eat as evidenced by NPO status. -ongoing  GOAL:   Patient will meet greater than or equal to 90% of their needs -unmet  MONITOR:   TF tolerance, Weight trends, Labs, Skin, I & O's  REASON FOR ASSESSMENT:   Consult Enteral/tube feeding initiation and management  ASSESSMENT:   39 year old male with past medical history of multiple sclerosis, neurogenic bladder with chronic indwelling foley catheter, peg tube for nutritional support. patient presented from home with reports of ongoing fevers and worsening upper respiratory tract congestion.   10/20 Consult received. Pt was extubated yesterday (10/19) around noon; needs re-estimated based on extubation. Pt sleeping at time of visit with no family/visitors present. Pt with PEG and currently receiving Osmolite 1.5 @ 45 mL/hr with 200 mL free water Q6h. This regimen is providing 1620 kcal (90% minimum estimated kcal needs), 68 grams protein (97% minimal estimated protein needs), and 1623 mL free water.  Moderate muscle and fat wasting detected during physical assessment. Will order TF as outlined above. MD states pt high aspiration risk; continuous TF preferential to bolus for this reason. If concern persists, prokinetic may be beneficial.  Medications reviewed. Labs reviewed; CBGs: 68-139 mg/dL, K: 3.3 mmol/L, creatinine low, Ca: 8.7 mg/dL.   10/17 - RD to adjust TF order to reflect updated estimated needs. - Patient is currently intubated on ventilator support MV: 13.2 L/min; Propofol: none  10/14 - Patient is currently intubated on ventilator support - MV: 13.4 L/min;  Propofol: none ml/hr - Pt with PEG at baseline and no TF running at this time. RN in the room states that MD mentioned possible TF initiation today.  - Spoke with pt's mother who was at bedside. She states that at home pt receives 2 cans Osmolite 1.5 that runs from approximately 0730-2200 each day @ 7.5 mL/hr.  - She states that cans are poured into a bag and that bag is empty when TF is turned off. Two cans of Osmolite 1.5 provides 705 kcal, 29.8 grams protein, and 362 mL free water; question accuracy of this report given low protein and kcal provision. - Mother states that pt is unable to do anything PO, including water.  - She states that he had previously lost weight but is now regaining weight. Physical assessment not done at this visit as RN working with pt, but suspect malnutrition is present.  - Needs re-estimated based on current vent settings and Tmax. Pt with hx of MS and is quadriplegic with extensive contractures so 10% estimated kcal needs subtracted to account for this.  10/13 - Pt was being intubated when RD was coming to see patient.  - Unable to perform nutrition focused physical exam and obtain history. - Pt with PEG tube, was receiving Osmolite 1.5 at home PTA. Unsure about goal rate from chart.  - RD has provided TF recommendations for while patient is intubated. - Patient is currently intubated on ventilator support MV: 8.5 L/min; Propofol: none   Diet Order:  Diet NPO time specified  Skin:  Wound (see comment) (Stage II knee and sacral ulcers)  Last BM:  10/19  Height:  Ht Readings from Last 1 Encounters:  10/20/14 6' 3.98" (1.93 m)    Weight:   Wt Readings from Last 1 Encounters:  10/28/14 159 lb 6.3 oz (72.3 kg)    Ideal Body Weight:  91.8 kg  BMI:  Body mass index is 19.41 kg/(m^2).  Estimated Nutritional Needs:   Kcal:  1800-2000  Protein:  70-80 grams  Fluid:  1.8-2 L/day  EDUCATION NEEDS:   No education needs identified at this  time     Jarome Matin, RD, LDN Inpatient Clinical Dietitian Pager # [phone] After hours/weekend pager # [phone]

## 2014-10-28 NOTE — Progress Notes (Signed)
[facility] ADULT ICU REPLACEMENT PROTOCOL FOR AM LAB REPLACEMENT ONLY  The patient does apply for the [facility] Adult ICU Electrolyte Replacment Protocol based on the criteria listed below:   1. Is GFR >/= 40 ml/min? Yes.    Patient's GFR today is >60 2. Is urine output >/= 0.5 ml/kg/hr for the last 6 hours? Yes.   Patient's UOP is 1.6 ml/kg/hr 3. Is BUN < 60 mg/dL? Yes.    Patient's BUN today is 12 4. Abnormal electrolyte(s): K 3.3 5. Ordered repletion with: per protocol 6. If a panic level lab has been reported, has the CCM MD in charge been notified? No..   Physician:    Ronda Fairly A 10/28/2014 6:24 AM

## 2014-10-29 DIAGNOSIS — E440 Moderate protein-calorie malnutrition: Secondary | ICD-10-CM | POA: Diagnosis present

## 2014-10-29 LAB — CBC WITH DIFFERENTIAL/PLATELET
BASOS PCT: 0 %
Basophils Absolute: 0 10*3/uL (ref 0.0–0.1)
Eosinophils Absolute: 0.2 10*3/uL (ref 0.0–0.7)
Eosinophils Relative: 1 %
HCT: 30.1 % — ABNORMAL LOW (ref 39.0–52.0)
HEMOGLOBIN: 10.1 g/dL — AB (ref 13.0–17.0)
LYMPHS PCT: 25 %
Lymphs Abs: 4.2 10*3/uL — ABNORMAL HIGH (ref 0.7–4.0)
MCH: 31.2 pg (ref 26.0–34.0)
MCHC: 33.6 g/dL (ref 30.0–36.0)
MCV: 92.9 fL (ref 78.0–100.0)
MONO ABS: 2.5 10*3/uL — AB (ref 0.1–1.0)
MONOS PCT: 15 %
NEUTROS ABS: 9.7 10*3/uL — AB (ref 1.7–7.7)
Neutrophils Relative %: 59 %
Platelets: 388 10*3/uL (ref 150–400)
RBC: 3.24 MIL/uL — AB (ref 4.22–5.81)
RDW: 14.6 % (ref 11.5–15.5)
WBC: 16.6 10*3/uL — ABNORMAL HIGH (ref 4.0–10.5)

## 2014-10-29 LAB — GLUCOSE, CAPILLARY
GLUCOSE-CAPILLARY: 157 mg/dL — AB (ref 65–99)
GLUCOSE-CAPILLARY: 137 mg/dL — AB (ref 65–99)
GLUCOSE-CAPILLARY: 120 mg/dL — AB (ref 65–99)
Glucose-Capillary: 139 mg/dL — ABNORMAL HIGH (ref 65–99)
Glucose-Capillary: 85 mg/dL (ref 65–99)
Glucose-Capillary: 115 mg/dL — ABNORMAL HIGH (ref 65–99)

## 2014-10-29 LAB — BASIC METABOLIC PANEL
ANION GAP: 10 (ref 5–15)
BUN: 13 mg/dL (ref 6–20)
CALCIUM: 8.6 mg/dL — AB (ref 8.9–10.3)
CO2: 23 mmol/L (ref 22–32)
Chloride: 100 mmol/L — ABNORMAL LOW (ref 101–111)
Creatinine, Ser: 0.36 mg/dL — ABNORMAL LOW (ref 0.61–1.24)
GFR calc Af Amer: >60 mL/min (ref >60)
GFR calc non Af Amer: >60 mL/min (ref >60)
GLUCOSE: 94 mg/dL (ref 65–99)
Potassium: 3.4 mmol/L — ABNORMAL LOW (ref 3.5–5.1)
Sodium: 133 mmol/L — ABNORMAL LOW (ref 135–145)

## 2014-10-29 MED ORDER — POTASSIUM CHLORIDE 20 MEQ/15ML (10%) PO SOLN
20.0000 meq | ORAL | Status: AC
  Administered 2014-10-29 (×2): 20 meq
  Filled 2014-10-29 (×2): qty 15

## 2014-10-29 NOTE — Progress Notes (Signed)
PHARMACY NOTE - antibiotic renal dose adjustment  Pharmacy consulted for adjusting antibiotic dosing for renal function - no antibiotics ordered.  Will sign off at this time.  Thanks,  Doreene Eland, PharmD, BCPS.   Pager: [phone] 10/29/2014. Now

## 2014-10-29 NOTE — Progress Notes (Signed)
[facility] ICU Electrolyte Replacement Protocol  Patient Name: Terry Palmer DOB: May 28, 1975 MRN: [id number]  Date of Service  10/29/2014   HPI/Events of Note    Recent Labs Lab 10/25/14 0530 10/26/14 0525 10/27/14 0543 10/28/14 0500 10/29/14 0510  NA 142 141 137 135 133*  K 3.9 3.2* 3.6 3.3* 3.4*  CL 113* 114* 105 102 100*  CO2 20* 20* 20* 24 23  GLUCOSE 180* 134* 121* 102* 94  BUN 11 15 19 12 13   CREATININE 0.40* 0.42* 0.58* 0.48* 0.36*  CALCIUM 8.7* 8.5* 8.8* 8.7* 8.6*    Estimated Creatinine Clearance: 126.6 mL/min (by C-G formula based on Cr of 0.36).  Intake/Output      10/20 0701 - 10/21 0700   Other 20   NG/GT 1131.3   Total Intake(mL/kg) 1151.3 (15.8)   Urine (mL/kg/hr) 1465 (0.8)   Stool 0 (0)   Total Output 1465   Net -313.8       Stool Occurrence 1 x    - I/O DETAILED x24h    Total I/O In: 900 [NG/GT:900] Out: 725 [Urine:725] - I/O THIS SHIFT    ASSESSMENT   eICURN Interventions  Electrolyte protocol criteria met. Irregular labs replaced per protocol. MD notified.   ASSESSMENT: MAJOR ELECTROLYTE    Terry Palmer 10/29/2014, 6:29 AM

## 2014-10-29 NOTE — Progress Notes (Addendum)
Patient ID: Terry Palmer, male   DOB: [birth year], 39 y.o.   MRN: [id number]  [facility] PROGRESS NOTE  DOIL KAMARA [id number] DOB: 15-Jan-1975 DOA: 10/20/2014 PCP: Reymundo Poll, MD   Brief narrative:    39 y/o w MS and significant debilitation including quadroplegia, dysphagia. Admitted with suspected UTI / suprapubic cath infxn. Developed resp distress and hypoxemia 10/13 w inability to manage secretions. At risk for ETT. PCCM consulted to evaluate 10/13.   SIGNIFICANT EVENTS: 10/12 Admitted to hospital 10/13 Hypoxic and poor airway compliance, intubated 10/16 Severe vent dyssynchrony this morning, thick secretions 10/17 Improved vent synchrony, thick secretions, off levophed. FOB with R bronchus intermedius mucus plug (removed)  10/19 Extubated, PEG dislodged then replaced  Assessment/Plan:    Principal Problem:   Acute respiratory failure with hypoxia ([place]) - in setting of poor secretion clearance - improved post bronch on 10/17, now resolved - Asthma exacerbation - improved with steroids 10/17 - Prednisone per tube > wean off over 5 days as was recommended by PCCM  - Continue Xopenex q6h, maintain this at discharge - Continue aspiration precautions - Continue guaifenesin  Active Problems:   Sepsis secondary to UTI ([place]) due to urinary indwelling Foley catheter ([place]) / Neurogenic bladder / Leukocytosis - Sepsis criteria met on admission with fever of 103 F, HR 122, RR 20, hypoxia of 88%, leukocytosis of 22.5. - Source of infections suspected UTI since UA on admission showed large leukocytes and many bacteria. 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  Family Communication:  plan of care discussed with the patient Disposition Plan: Home when stable.   IV access:  Peripheral IV  Procedures and diagnostic studies:    Dg Chest 2 View 10/20/2014 No active cardiopulmonary disease.   Dg Chest Port 1 View 10/28/2014 Low lung volumes and mild bibasilar atelectasis, without significant change. Persistent colonic dilatation noted.   Dg Chest Port 1 View 10/27/2014  Endotracheal tube and central line in stable position. 2. Low lung volumes.   Dg Chest Port 1 View 10/26/2014  No change in position of the endotracheal tube and left IJ central venous line. 2. No change in bibasilar volume loss and considerable gaseous distention of the colon.   Dg Chest Port 1 View 10/21/2014 Right lower lobe pneumonia or aspiration. 2. Low lung volumes with moderate pulmonary vascular congestion. 3. Moderate gaseous distention of the bowel in the right upper quadrant, mostly colon.   Dg Chest Port 1 View 10/21/2014 Interval  increased bilateral interstitial markings. Consider viral/atypical pneumonia in this setting. Main differential consideration is pulmonary vascular congestion.   Dg Abd Portable 1v 10/27/2014  Gastrostomy tube is located within the gastric lumen. Electronically Signed   By: Lavonia Dana M.D.   On: 10/27/2014 16:49   Dg Abd Portable  1v 10/27/2014 Diffuse gaseous distention of colon, likely patient's baseline. Colonic ileus is the main differential consideration and follow-up KUB could document stability if there are concerning clinical features.    Medical Consultants:  None   Other Consultants:  None  IAnti-Infectives:   10/12 vanc >> stop 10/17 10/12 ceftaz >> 10/13 (dc and changed to Zosyn to cover possible aspiration) 10/13 zosyn >> 10/18  Faye Ramsay, MD  [facility] Pager [phone]  If 7PM-7AM, please contact night-coverage [email or web address] Password [facility] 10/29/2014, 6:34 AM   LOS: 9 days   HPI/Subjective: No events overnight.   Objective: Filed Vitals:   10/29/14 0000 10/29/14 0255 10/29/14 0400 10/29/14 0500  BP: 108/71  99/61   Pulse:  111    Temp: 98.6 F (37 C)  100.1 F (37.8 C)   TempSrc: Axillary  Axillary   Resp: _0 Height:      Weight:    72.9 kg (160 lb 11.5 oz)  SpO2: 95% 94% 96%     Intake/Output Summary (Last 24 hours) at 10/29/14 0634 Last data filed at 10/29/14 0600  Gross per 24 hour  Intake 1241.25 ml  Output   1465 ml  Net -223.75 ml    Exam:   General:  Pt is non verbal, contracted   Cardiovascular: Regular rate and rhythm, S1/S2, no murmurs, no rubs, no gallops  Respiratory: Clear to auscultation bilaterally, diminished breath sounds at bases   Data Reviewed: Basic Metabolic Panel:  Recent Labs Lab 10/25/14 0530 10/26/14 0525 10/27/14 0543 10/28/14 0500 10/29/14 0510  NA 142 141 137 135 133*  K 3.9 3.2* 3.6 3.3* 3.4*  CL 113* 114* 105 102 100*  CO2 20* 20* 20* 24 23  GLUCOSE 180* 134* 121* 102* 94  BUN _1 CREATININE 0.40* 0.42* 0.58* 0.48* 0.36*  CALCIUM 8.7* 8.5* 8.8* 8.7* 8.6*   CBC:  Recent Labs Lab 10/24/14 0509 10/25/14 0530 10/26/14 0525 10/27/14 0543 10/28/14 0500 10/29/14 0510  WBC 11.6* 10.5 16.1* 23.6* 17.4* 16.6*  NEUTROABS 8.7* 9.9* 13.2*  --  11.5* 9.7*  HGB 12.8* 11.4* 10.6* 10.9* 11.4* 10.1*  HCT  34.9* 32.3* 30.5* 32.2* 33.6* 30.1*  MCV 85.7 86.4 89.4 91.7 91.1 92.9  PLT 188 186 226 293 334 388   CBG:  Recent Labs Lab 10/28/14 1137 10/28/14 1539 10/28/14 1933 10/28/14 2354 10/29/14 0509  GLUCAP 151* 166* 160* 139* 85    Recent Results (from the past 240 hour(s))  Culture, blood (routine x 2)     Status: None   Collection Time: 10/20/14 12:20 PM  Result Value Ref Range Status   Specimen Description BLOOD LEFT FOOT  Final   Special Requests IN PEDIATRIC BOTTLE 3ML  Final   Culture   Final    NO GROWTH 5 DAYS Performed at [facility]    Report Status 10/25/2014 FINAL  Final  Culture, blood (routine x 2)     Status: None   Collection Time: 10/20/14 12:35 PM  Result Value Ref Range Status   Specimen Description BLOOD LEFT HAND  Final   Special Requests BOTTLES DRAWN AEROBIC ONLY 6ML  Final   Culture  Final    NO GROWTH 5 DAYS Performed at [facility]    Report Status 10/25/2014 FINAL  Final  Urine culture     Status: None   Collection Time: 10/20/14  1:28 PM  Result Value Ref Range Status   Specimen Description URINE, CLEAN CATCH  Final   Special Requests NONE  Final   Culture   Final    MULTIPLE SPECIES PRESENT, SUGGEST RECOLLECTION Performed at [facility]    Report Status 10/22/2014 FINAL  Final  Urine culture     Status: None   Collection Time: 10/20/14  6:17 PM  Result Value Ref Range Status   Specimen Description URINE, SUPRAPUBIC  Final   Special Requests NONE  Final   Culture   Final    MULTIPLE SPECIES PRESENT, SUGGEST RECOLLECTION Performed at [facility]    Report Status 10/22/2014 FINAL  Final  MRSA PCR Screening     Status: Abnormal   Collection Time: 10/21/14  1:30 AM  Result Value Ref Range Status   MRSA by PCR POSITIVE (A) NEGATIVE Final    Comment:        The GeneXpert MRSA Assay (FDA approved for NASAL specimens only), is one component of a comprehensive MRSA colonization surveillance program. It  is not intended to diagnose MRSA infection nor to guide or monitor treatment for MRSA infections. RESULT CALLED TO, READ BACK BY AND VERIFIED WITH: Bethann Humble RN [id number] 10/21/14 A NAVARRO   Culture, respiratory (NON-Expectorated)     Status: None   Collection Time: 10/21/14  9:17 AM  Result Value Ref Range Status   Specimen Description TRACHEAL ASPIRATE  Final   Special Requests NONE  Final   Gram Stain   Final    RARE WBC PRESENT, PREDOMINANTLY MONONUCLEAR RARE SQUAMOUS EPITHELIAL CELLS PRESENT NO ORGANISMS SEEN Performed at [facility]    Culture   Final    Non-Pathogenic Oropharyngeal-type Flora Isolated. Performed at [facility]    Report Status 10/25/2014 FINAL  Final  Culture, respiratory (NON-Expectorated)     Status: None   Collection Time: 10/23/14 10:32 PM  Result Value Ref Range Status   Specimen Description TRACHEAL ASPIRATE  Final   Special Requests Normal  Final   Gram Stain   Final    FEW WBC PRESENT,BOTH PMN AND MONONUCLEAR NO ORGANISMS SEEN Performed at [facility]    Culture   Final    NO GROWTH 2 DAYS Performed at [facility]    Report Status 10/26/2014 FINAL  Final  Culture, respiratory (NON-Expectorated)     Status: None   Collection Time: 10/25/14 11:49 AM  Result Value Ref Range Status   Specimen Description BRONCHIAL WASHINGS RIGHT  Final   Special Requests Normal  Final   Gram Stain   Final    RARE WBC PRESENT, PREDOMINANTLY PMN NO SQUAMOUS EPITHELIAL CELLS SEEN NO ORGANISMS SEEN Performed at [facility]    Culture   Final    NO GROWTH 2 DAYS Performed at [facility]    Report Status 10/28/2014 FINAL  Final     Scheduled Meds: . acidophilus  1 capsule Oral Daily  . antiseptic oral rinse  7 mL Mouth Rinse q12n4p  . ascorbic acid  500 mg Per Tube BID  . baclofen  20 mg Per Tube TID  . buPROPion  75 mg Per Tube TID  . chlorhexidine  15 mL Mouth Rinse BID  . cholecalciferol  5,000  Units Oral Daily  . dantrolene  200 mg Oral QHS  . free water  100 mL Per Tube 4 times per day  . guaiFENesin  10 mL Oral 3 times per day  . insulin aspart  0-15 Units Subcutaneous 6 times per day  . levalbuterol  0.63 mg Nebulization 4 times per day  . levETIRAcetam  1,000 mg Per Tube BID  . loratadine  10 mg Oral Daily  . multivitamin  5 mL Per Tube Daily  . nystatin  1 g Topical Daily  . pantoprazole sodium  40 mg Per Tube Daily  . polyethylene glycol  17 g Per Tube Daily  . polyvinyl alcohol  1 drop Both Eyes BID  . potassium chloride  20 mEq Per Tube Q4H  . predniSONE  20 mg Per Tube Q breakfast  . senna  2 tablet Oral Daily  . sodium chloride  3 mL Intravenous Q12H   Continuous Infusions: . feeding supplement (OSMOLITE 1.5 CAL) 1,000 mL (10/29/14 0600)

## 2014-10-30 DIAGNOSIS — J9600 Acute respiratory failure, unspecified whether with hypoxia or hypercapnia: Secondary | ICD-10-CM

## 2014-10-30 DIAGNOSIS — R509 Fever, unspecified: Secondary | ICD-10-CM

## 2014-10-30 LAB — GLUCOSE, CAPILLARY
GLUCOSE-CAPILLARY: 122 mg/dL — AB (ref 65–99)
GLUCOSE-CAPILLARY: 141 mg/dL — AB (ref 65–99)
GLUCOSE-CAPILLARY: 138 mg/dL — AB (ref 65–99)
GLUCOSE-CAPILLARY: 116 mg/dL — AB (ref 65–99)
Glucose-Capillary: 132 mg/dL — ABNORMAL HIGH (ref 65–99)

## 2014-10-30 LAB — CBC
HEMATOCRIT: 36 % — AB (ref 39.0–52.0)
Hemoglobin: 12 g/dL — ABNORMAL LOW (ref 13.0–17.0)
MCH: 31.1 pg (ref 26.0–34.0)
MCHC: 33.3 g/dL (ref 30.0–36.0)
MCV: 93.3 fL (ref 78.0–100.0)
Platelets: 455 10*3/uL — ABNORMAL HIGH (ref 150–400)
RBC: 3.86 MIL/uL — ABNORMAL LOW (ref 4.22–5.81)
RDW: 14.8 % (ref 11.5–15.5)
WBC: 18.4 10*3/uL — ABNORMAL HIGH (ref 4.0–10.5)

## 2014-10-30 LAB — BASIC METABOLIC PANEL
Anion gap: 10 (ref 5–15)
BUN: 12 mg/dL (ref 6–20)
CALCIUM: 8.7 mg/dL — AB (ref 8.9–10.3)
CO2: 21 mmol/L — AB (ref 22–32)
CREATININE: 0.51 mg/dL — AB (ref 0.61–1.24)
Chloride: 100 mmol/L — ABNORMAL LOW (ref 101–111)
GFR calc non Af Amer: >60 mL/min (ref >60)
Glucose, Bld: 112 mg/dL — ABNORMAL HIGH (ref 65–99)
Potassium: 4 mmol/L (ref 3.5–5.1)
Sodium: 131 mmol/L — ABNORMAL LOW (ref 135–145)

## 2014-10-30 NOTE — Progress Notes (Addendum)
Patient ID: Terry Palmer, male   DOB: August 28, 1975, 39 y.o.   MRN: [id number]  [facility] PROGRESS NOTE  POLK MINOR [id number] DOB: [birth year] DOA: 10/20/2014 PCP: Reymundo Poll, MD   Brief narrative:    39 y/o w MS and significant debilitation including quadroplegia, dysphagia. Admitted with suspected UTI / suprapubic cath infxn. Developed resp distress and hypoxemia 10/13 w inability to manage secretions. At risk for ETT. PCCM consulted to evaluate 10/13.   SIGNIFICANT EVENTS: 10/12 Admitted to hospital 10/13 Hypoxic and poor airway compliance, intubated 10/16 Severe vent dyssynchrony this morning, thick secretions 10/17 Improved vent synchrony, thick secretions, off levophed. FOB with R bronchus intermedius mucus plug (removed)  10/19 Extubated, PEG dislodged then replaced  Assessment/Plan:    Principal Problem:   Acute respiratory failure with hypoxia ([place]) - in setting of poor secretion clearance - improved post bronch on 10/17, now resolved - Asthma exacerbation - improved with steroids 10/17 - Prednisone per tube > wean off over 5 days as was recommended by PCCM  - Continue Xopenex q6h, maintain this at discharge - Continue aspiration precautions - Continue guaifenesin  Active Problems:   Sepsis secondary to UTI ([place]) due to urinary indwelling Foley catheter ([place]) / Neurogenic bladder / Leukocytosis - Sepsis criteria met on admission with fever of 103 F, HR 122, RR 20, hypoxia of 88%, leukocytosis of 22.5. - Source of infections suspected UTI since UA on admission showed large leukocytes and many bacteria. 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  Family Communication:  plan of care discussed with the patient Disposition Plan: Home when stable.   IV access:  Peripheral IV  Procedures and diagnostic studies:    Dg Chest 2 View 10/20/2014 No active cardiopulmonary disease.   Dg Chest Port 1 View 10/28/2014 Low lung volumes and mild bibasilar atelectasis, without significant change. Persistent colonic dilatation noted.   Dg Chest Port 1 View 10/27/2014  Endotracheal tube and central line in stable position. 2. Low lung volumes.   Dg Chest Port 1 View 10/26/2014  No change in position of the endotracheal tube and left IJ central venous line. 2. No change in bibasilar volume loss and considerable gaseous distention of the colon.   Dg Chest Port 1 View 10/21/2014 Right lower lobe pneumonia or aspiration. 2. Low lung volumes with moderate pulmonary vascular congestion. 3. Moderate gaseous distention of the bowel in the right upper quadrant, mostly colon.   Dg Chest [facility]  1 View 10/21/2014 Interval increased bilateral interstitial markings. Consider viral/atypical pneumonia in this setting. Main differential consideration is pulmonary vascular congestion.   Dg Abd Portable 1v 10/27/2014  Gastrostomy tube is located within the gastric lumen. Electronically Signed   By: Lavonia Dana M.D.   On: 10/27/2014 16:49    Dg Abd Portable 1v 10/27/2014 Diffuse gaseous distention of colon, likely patient's baseline. Colonic ileus is the main differential consideration and follow-up KUB could document stability if there are concerning clinical features.    Medical Consultants:  None   Other Consultants:  None  IAnti-Infectives:   10/12 vanc >> stop 10/17 10/12 ceftaz >> 10/13 (dc and changed to Zosyn to cover possible aspiration) 10/13 zosyn >> 10/18  Faye Ramsay, MD  [facility] Pager [phone]  If 7PM-7AM, please contact night-coverage [email or web address] Password [email or web address] 10/30/2014, 3:03 PM   LOS: 10 days   HPI/Subjective: No events overnight.   Objective: Filed Vitals:   10/30/14 1000 10/30/14 1100 10/30/14 1200 10/30/14 1300  BP:   98/60   Pulse: 115 110 109 110  Temp:      TempSrc:      Resp: 22 21 17 19   Height:      Weight:      SpO2: 96% 98% 93% 95%    Intake/Output Summary (Last 24 hours) at 10/30/14 1503 Last data filed at 10/30/14 1300  Gross per 24 hour  Intake   1350 ml  Output   1075 ml  Net    275 ml    Exam:   General:  Pt is non verbal, contracted   Cardiovascular: Regular rate and rhythm, S1/S2, no murmurs, no rubs, no gallops  Respiratory: Clear to auscultation bilaterally, diminished breath sounds at bases   Data Reviewed: Basic Metabolic Panel:  Recent Labs Lab 10/26/14 0525 10/27/14 0543 10/28/14 0500 10/29/14 0510 10/30/14 0425  NA 141 137 135 133* 131*  K 3.2* 3.6 3.3* 3.4* 4.0  CL 114* 105 102 100* 100*  CO2 20* 20* 24 23 21*  GLUCOSE 134* 121* 102* 94 112*  BUN 15 19 12 13 12   CREATININE 0.42* 0.58* 0.48* 0.36* 0.51*  CALCIUM 8.5* 8.8* 8.7* 8.6* 8.7*   CBC:  Recent Labs Lab 10/24/14 0509 10/25/14 0530 10/26/14 0525 10/27/14 0543 10/28/14 0500 10/29/14 0510 10/30/14 0425  WBC 11.6* 10.5 16.1* 23.6* 17.4* 16.6* 18.4*  NEUTROABS 8.7* 9.9* 13.2*  --  11.5* 9.7*  --   HGB 12.8* 11.4* 10.6* 10.9* 11.4* 10.1* 12.0*  HCT 34.9* 32.3*  30.5* 32.2* 33.6* 30.1* 36.0*  MCV 85.7 86.4 89.4 91.7 91.1 92.9 93.3  PLT 188 186 226 293 334 388 455*   CBG:  Recent Labs Lab 10/29/14 1253 10/29/14 1604 10/29/14 1953 10/29/14 2351 10/30/14 0755  GLUCAP 157* 137* 120* 132* 122*    Recent Results (from the past 240 hour(s))  Urine culture     Status: None   Collection Time: 10/20/14  6:17 PM  Result Value Ref Range Status   Specimen Description URINE, SUPRAPUBIC  Final   Special Requests NONE  Final   Culture   Final    MULTIPLE SPECIES PRESENT, SUGGEST RECOLLECTION Performed at [facility]    Report Status 10/22/2014 FINAL  Final  MRSA PCR Screening     Status: Abnormal   Collection Time: 10/21/14  1:30 AM  Result Value Ref Range Status   MRSA by PCR POSITIVE (A) NEGATIVE Final    Comment:        The  GeneXpert MRSA Assay (FDA approved for NASAL specimens only), is one component of a comprehensive MRSA colonization surveillance program. It is not intended to diagnose MRSA infection nor to guide or monitor treatment for MRSA infections. RESULT CALLED TO, READ BACK BY AND VERIFIED WITH: Bethann Humble RN [id number] 10/21/14 A NAVARRO   Culture, respiratory (NON-Expectorated)     Status: None   Collection Time: 10/21/14  9:17 AM  Result Value Ref Range Status   Specimen Description TRACHEAL ASPIRATE  Final   Special Requests NONE  Final   Gram Stain   Final    RARE WBC PRESENT, PREDOMINANTLY MONONUCLEAR RARE SQUAMOUS EPITHELIAL CELLS PRESENT NO ORGANISMS SEEN Performed at [facility]    Culture   Final    Non-Pathogenic Oropharyngeal-type Flora Isolated. Performed at [facility]    Report Status 10/25/2014 FINAL  Final  Culture, respiratory (NON-Expectorated)     Status: None   Collection Time: 10/23/14 10:32 PM  Result Value Ref Range Status   Specimen Description TRACHEAL ASPIRATE  Final   Special Requests Normal  Final   Gram Stain   Final    FEW WBC PRESENT,BOTH PMN AND MONONUCLEAR NO  ORGANISMS SEEN Performed at [facility]    Culture   Final    NO GROWTH 2 DAYS Performed at [facility]    Report Status 10/26/2014 FINAL  Final  Culture, respiratory (NON-Expectorated)     Status: None   Collection Time: 10/25/14 11:49 AM  Result Value Ref Range Status   Specimen Description BRONCHIAL WASHINGS RIGHT  Final   Special Requests Normal  Final   Gram Stain   Final    RARE WBC PRESENT, PREDOMINANTLY PMN NO SQUAMOUS EPITHELIAL CELLS SEEN NO ORGANISMS SEEN Performed at [facility]    Culture   Final    NO GROWTH 2 DAYS Performed at [facility]    Report Status 10/28/2014 FINAL  Final     Scheduled Meds: . acidophilus  1 capsule Oral Daily  . antiseptic oral rinse  7 mL Mouth Rinse q12n4p  . ascorbic acid  500 mg Per Tube BID  . baclofen  20 mg Per Tube TID  . buPROPion  75 mg Per Tube TID  . chlorhexidine  15 mL Mouth Rinse BID  . cholecalciferol  5,000 Units Oral Daily  . dantrolene  200 mg Oral QHS  . free water  100 mL Per Tube 4 times per day  . guaiFENesin  10 mL Oral 3 times per day  . insulin aspart  0-15 Units Subcutaneous 6 times per day  . levalbuterol  0.63 mg Nebulization 4 times per day  . levETIRAcetam  1,000 mg Per Tube BID  . loratadine  10 mg Oral Daily  . multivitamin  5 mL Per Tube Daily  . nystatin  1 g Topical Daily  . pantoprazole sodium  40 mg Per Tube Daily  . polyethylene glycol  17 g Per Tube Daily  . polyvinyl alcohol  1 drop Both Eyes BID  . predniSONE  20 mg Per Tube Q breakfast  . senna  2 tablet Oral Daily  . sodium chloride  3 mL Intravenous Q12H   Continuous Infusions: . feeding supplement (OSMOLITE 1.5 CAL) 1,000 mL (10/30/14 1300)

## 2014-10-31 DIAGNOSIS — L8992 Pressure ulcer of unspecified site, stage 2: Secondary | ICD-10-CM

## 2014-10-31 DIAGNOSIS — E871 Hypo-osmolality and hyponatremia: Secondary | ICD-10-CM

## 2014-10-31 DIAGNOSIS — E43 Unspecified severe protein-calorie malnutrition: Secondary | ICD-10-CM

## 2014-10-31 LAB — GLUCOSE, CAPILLARY
GLUCOSE-CAPILLARY: 120 mg/dL — AB (ref 65–99)
GLUCOSE-CAPILLARY: 95 mg/dL (ref 65–99)
Glucose-Capillary: 104 mg/dL — ABNORMAL HIGH (ref 65–99)
Glucose-Capillary: 110 mg/dL — ABNORMAL HIGH (ref 65–99)
Glucose-Capillary: 136 mg/dL — ABNORMAL HIGH (ref 65–99)
Glucose-Capillary: 143 mg/dL — ABNORMAL HIGH (ref 65–99)
Glucose-Capillary: 174 mg/dL — ABNORMAL HIGH (ref 65–99)

## 2014-10-31 MED ORDER — LIP MEDEX EX OINT
TOPICAL_OINTMENT | CUTANEOUS | Status: AC
  Administered 2014-10-31: 11:00:00
  Filled 2014-10-31: qty 7

## 2014-10-31 MED ORDER — LEVALBUTEROL HCL 0.63 MG/3ML IN NEBU
0.6300 mg | INHALATION_SOLUTION | Freq: Three times a day (TID) | RESPIRATORY_TRACT | Status: DC
  Administered 2014-10-31 – 2014-11-02 (×7): 0.63 mg via RESPIRATORY_TRACT
  Filled 2014-10-31 (×7): qty 3

## 2014-10-31 NOTE — Progress Notes (Addendum)
Patient ID: Terry Palmer, male   DOB: 1975/02/15, 39 y.o.   MRN: [id number] [facility] PROGRESS NOTE  Terry Palmer:[id number] DOB: 04/05/1975 DOA: 10/20/2014 PCP: Reymundo Poll, MD  Brief narrative:    39 y/o w MS and significant debilitation including quadroplegia, dysphagia. Admitted with suspected UTI / suprapubic cath infxn. Developed resp distress and hypoxemia 10/13 w inability to manage secretions. Pt was intubated. PCCM as priamry team and care transferred to [facility] 10/21.  SIGNIFICANT EVENTS: 10/12 Admitted to hospital 10/13 Hypoxic and poor airway compliance, intubated 10/16 Severe vent dyssynchrony this morning, thick secretions 10/17 Improved vent synchrony, thick secretions, off levophed. FOB with R bronchus intermedius mucus plug (removed)  10/19 Extubated, PEG dislodged then replaced  Anticipated discharge: depending on his progress but hopefully by 10/25.  Assessment/Plan:    Principal Problem:  Acute respiratory failure with hypoxia (HCC) / Ventilatory dependent respiratory failure / Aspiration pneumonitis  - Pt under PCCM care as he has required intubattion  - Pt underwent bronch 10/17 - Respiratory status stable; has improved with steroids, currently on prednisone 20 mg per tube daily (to be weaned over next 4 days) - Continue xopenex every 8 hours - Continue aspiration precautions - Continue guaifenesin  Active Problems:  Sepsis secondary to UTI ([place]) due to urinary indwelling Foley catheter ([place]) / Neurogenic bladder / Leukocytosis - Sepsis criteria met on admission with fever of 103 F, HR 122, RR 20, hypoxia of 88%, leukocytosis of 22.5. - Source of infections suspected to be catheter associated UTI (UA on admission showed large leukocytes and many bacteria. In addition, pt with chronic indwelling foley catheter and nerogenic bladder) - Blood cultures so far negative - Urine culture showed multiple species - Resp culture from  suctioning showed no growth  - BAL 10/17 - negative  - Continue to spike fever  - Not on any abx at this time, please refer to anti-infective section for abx given during this hospital stay    Quadriparesis (muscle weakness) ([place]) - MS with Quadriparesis  - Non verbal   Metabolic encephalopathy / Seizures  - Continue Keppra - Continue home medications for muscle spasms   FTT (failure to thrive) in adult / Severe protein calorie malnutrition  - In the context of MS - Continue tube feeds with free water flushes    Hyponatremia, hypokalemia - Due to poor intake - Supplemented   Sacral decubitus ulcer, stage II - WOC consulted    DVT Prophylaxis  - SCD's bilaterally   Code Status: Full.  Family Communication:  Family not at the bedside  Disposition Plan: once fever improves and if pt stable possibly by 10/25   IV access:  Peripheral IV  Procedures and diagnostic studies:    Dg Chest 2 View 10/20/2014 No active cardiopulmonary disease.    Dg Chest Port 1 View 10/28/2014 Low lung volumes and mild bibasilar atelectasis, without significant change. Persistent colonic dilatation noted.   Dg Chest Port 1 View 10/27/2014 Endotracheal tube and central line in stable position. 2. Low lung volumes.   Dg Chest Port 1 View 10/26/2014 No change in position of the endotracheal tube and left IJ central venous line. 2. No change in bibasilar volume loss and considerable gaseous distention of the colon.   Dg Chest Port 1 View 10/21/2014 Right lower lobe pneumonia or aspiration. 2. Low lung volumes with moderate pulmonary vascular congestion. 3. Moderate gaseous distention of the bowel in the right upper quadrant, mostly colon.   Dg Chest  Port 1 View 10/21/2014 Interval increased bilateral interstitial markings. Consider viral/atypical pneumonia in this setting. Main differential consideration is pulmonary vascular congestion.   Dg Abd Portable 1v 10/27/2014 Gastrostomy tube is  located within the gastric lumen. Electronically Signed By: Lavonia Dana M.D. On: 10/27/2014 16:49   Dg Abd Portable 1v 10/27/2014 Diffuse gaseous distention of colon, likely patient's baseline. Colonic ileus is the main differential consideration and follow-up KUB could document stability if there are concerning clinical features.   IAnti-Infectives:   10/12 vanc >> stop 10/17 10/12 ceftaz >> 10/13 (dc and changed to Zosyn to cover possible aspiration) 10/13 zosyn >> 10/18    Leisa Lenz, MD  [facility] Pager [phone]  Time spent in minutes: 25 minutes  If 7PM-7AM, please contact night-coverage [email or web address] Password [email or web address] 10/31/2014, 7:19 AM   LOS: 11 days    HPI/Subjective: No acute overnight events. No respiratory distress.  Objective: Filed Vitals:   10/30/14 1500 10/30/14 1615 10/30/14 2018 10/31/14 0523  BP:  103/63 95/67 101/63  Pulse: 115 118 110 107  Temp:   97.4 F (36.3 C) 98 F (36.7 C)  TempSrc:   Oral Axillary  Resp: _0 Height:      Weight:    69.5 kg (153 lb 3.5 oz)  SpO2: 92% 98% 99% 100%    Intake/Output Summary (Last 24 hours) at 10/31/14 0719 Last data filed at 10/31/14 0528  Gross per 24 hour  Intake    450 ml  Output   2500 ml  Net  -2050 ml    Exam:   General:  Pt is not in acute distress  Cardiovascular: Regular rate and rhythm, S1/S2 (+)  Respiratory: diminished breath sounds, no wheezing   Abdomen: Soft, non tender, (+) PEG in place, bowel sounds present  Extremities: pulses DP and PT palpable bilaterally  Neuro: Grossly nonfocal  Data Reviewed: Basic Metabolic Panel:  Recent Labs Lab 10/26/14 0525 10/27/14 0543 10/28/14 0500 10/29/14 0510 10/30/14 0425  NA 141 137 135 133* 131*  K 3.2* 3.6 3.3* 3.4* 4.0  CL 114* 105 102 100* 100*  CO2 20* 20* 24 23 21*  GLUCOSE 134* 121* 102* 94 112*  BUN _1 CREATININE 0.42* 0.58* 0.48* 0.36* 0.51*  CALCIUM 8.5* 8.8* 8.7* 8.6* 8.7*   Liver  Function Tests: No results for input(s): AST, ALT, ALKPHOS, BILITOT, PROT, ALBUMIN in the last 168 hours. No results for input(s): LIPASE, AMYLASE in the last 168 hours. No results for input(s): AMMONIA in the last 168 hours. CBC:  Recent Labs Lab 10/25/14 0530 10/26/14 0525 10/27/14 0543 10/28/14 0500 10/29/14 0510 10/30/14 0425  WBC 10.5 16.1* 23.6* 17.4* 16.6* 18.4*  NEUTROABS 9.9* 13.2*  --  11.5* 9.7*  --   HGB 11.4* 10.6* 10.9* 11.4* 10.1* 12.0*  HCT 32.3* 30.5* 32.2* 33.6* 30.1* 36.0*  MCV 86.4 89.4 91.7 91.1 92.9 93.3  PLT 186 226 293 334 388 455*   Cardiac Enzymes: No results for input(s): CKTOTAL, CKMB, CKMBINDEX, TROPONINI in the last 168 hours. BNP: Invalid input(s): POCBNP CBG:  Recent Labs Lab 10/30/14 1231 10/30/14 1625 10/30/14 2008 10/30/14 2356 10/31/14 0521  GLUCAP 141* 138* 116* 110* 120*    Recent Results (from the past 240 hour(s))  Culture, respiratory (NON-Expectorated)     Status: None   Collection Time: 10/21/14  9:17 AM  Result Value Ref Range Status   Specimen Description TRACHEAL ASPIRATE  Final   Special Requests NONE  Final  Gram Stain   Final    RARE WBC PRESENT, PREDOMINANTLY MONONUCLEAR RARE SQUAMOUS EPITHELIAL CELLS PRESENT NO ORGANISMS SEEN Performed at [facility]    Culture   Final    Non-Pathogenic Oropharyngeal-type [place] Isolated. Performed at [facility]    Report Status 10/25/2014 FINAL  Final  Culture, respiratory (NON-Expectorated)     Status: None   Collection Time: 10/23/14 10:32 PM  Result Value Ref Range Status   Specimen Description TRACHEAL ASPIRATE  Final   Special Requests Normal  Final   Gram Stain   Final    FEW WBC PRESENT,BOTH PMN AND MONONUCLEAR NO ORGANISMS SEEN Performed at [facility]    Culture   Final    NO GROWTH 2 DAYS Performed at [facility]    Report Status 10/26/2014 FINAL  Final  Culture, respiratory (NON-Expectorated)     Status: None    Collection Time: 10/25/14 11:49 AM  Result Value Ref Range Status   Specimen Description BRONCHIAL WASHINGS RIGHT  Final   Special Requests Normal  Final   Gram Stain   Final    RARE WBC PRESENT, PREDOMINANTLY PMN NO SQUAMOUS EPITHELIAL CELLS SEEN NO ORGANISMS SEEN Performed at [facility]    Culture   Final    NO GROWTH 2 DAYS Performed at [facility]    Report Status 10/28/2014 FINAL  Final     Scheduled Meds: . acidophilus  1 capsule Oral Daily  . antiseptic oral rinse  7 mL Mouth Rinse q12n4p  . ascorbic acid  500 mg Per Tube BID  . baclofen  20 mg Per Tube TID  . buPROPion  75 mg Per Tube TID  . chlorhexidine  15 mL Mouth Rinse BID  . cholecalciferol  5,000 Units Oral Daily  . dantrolene  200 mg Oral QHS  . free water  100 mL Per Tube 4 times per day  . guaiFENesin  10 mL Oral 3 times per day  . insulin aspart  0-15 Units Subcutaneous 6 times per day  . levalbuterol  0.63 mg Nebulization TID  . levETIRAcetam  1,000 mg Per Tube BID  . loratadine  10 mg Oral Daily  . multivitamin  5 mL Per Tube Daily  . nystatin  1 g Topical Daily  . pantoprazole sodium  40 mg Per Tube Daily  . polyethylene glycol  17 g Per Tube Daily  . polyvinyl alcohol  1 drop Both Eyes BID  . predniSONE  20 mg Per Tube Q breakfast  . senna  2 tablet Oral Daily  . sodium chloride  3 mL Intravenous Q12H   Continuous Infusions: . feeding supplement (OSMOLITE 1.5 CAL) 1,000 mL (10/30/14 1752)

## 2014-11-01 LAB — GLUCOSE, CAPILLARY
GLUCOSE-CAPILLARY: 129 mg/dL — AB (ref 65–99)
GLUCOSE-CAPILLARY: 98 mg/dL (ref 65–99)
GLUCOSE-CAPILLARY: 146 mg/dL — AB (ref 65–99)
GLUCOSE-CAPILLARY: 126 mg/dL — AB (ref 65–99)
Glucose-Capillary: 103 mg/dL — ABNORMAL HIGH (ref 65–99)

## 2014-11-01 LAB — BASIC METABOLIC PANEL
Anion gap: 10 (ref 5–15)
BUN: 18 mg/dL (ref 6–20)
CHLORIDE: 105 mmol/L (ref 101–111)
CO2: 24 mmol/L (ref 22–32)
CREATININE: 0.48 mg/dL — AB (ref 0.61–1.24)
Calcium: 9.4 mg/dL (ref 8.9–10.3)
GFR calc Af Amer: >60 mL/min (ref >60)
GFR calc non Af Amer: >60 mL/min (ref >60)
GLUCOSE: 112 mg/dL — AB (ref 65–99)
POTASSIUM: 3.8 mmol/L (ref 3.5–5.1)
SODIUM: 139 mmol/L (ref 135–145)

## 2014-11-01 LAB — CBC
HCT: 36.4 % — ABNORMAL LOW (ref 39.0–52.0)
HEMOGLOBIN: 12.3 g/dL — AB (ref 13.0–17.0)
MCH: 31.7 pg (ref 26.0–34.0)
MCHC: 33.8 g/dL (ref 30.0–36.0)
MCV: 93.8 fL (ref 78.0–100.0)
PLATELETS: 475 10*3/uL — AB (ref 150–400)
RBC: 3.88 MIL/uL — AB (ref 4.22–5.81)
RDW: 14.8 % (ref 11.5–15.5)
WBC: 20.1 10*3/uL — ABNORMAL HIGH (ref 4.0–10.5)

## 2014-11-01 NOTE — Care Management Important Message (Signed)
Important Message  Patient DetailsIM Letter given to Nora/Case Manager to present to [place] Message  Patient Details  Name: Terry Palmer MRN: [id number] Date of Birth: 15-Jul-1975   Medicare Important Message Given:  Yes-second notification given    Camillo Flaming 11/01/2014, 10:47 AM  Name: Terry Palmer MRN: [id number] Date of Birth: 1975-05-11   Medicare Important Message Given:  Yes-second notification given    Camillo Flaming 11/01/2014, 10:47 AM

## 2014-11-01 NOTE — Progress Notes (Addendum)
Patient ID: Terry Palmer, male   DOB: 1975/05/05, 39 y.o.   MRN: [id number] [facility] PROGRESS NOTE  Terry Palmer LYY:[id number] DOB: 04/29/1975 DOA: 10/20/2014 PCP: Reymundo Poll, MD  Brief narrative:    39 y/o w MS and significant debilitation including quadroplegia, dysphagia. Admitted with suspected UTI / suprapubic cath infxn. Developed resp distress and hypoxemia 10/13 w inability to manage secretions. Pt was intubated. PCCM as priamry team and care transferred to [facility] 10/21.  SIGNIFICANT EVENTS: 10/12 Admitted to hospital 10/13 Hypoxic and poor airway compliance, intubated 10/16 Severe vent dyssynchrony this morning, thick secretions 10/17 Improved vent synchrony, thick secretions, off levophed. FOB with R bronchus intermedius mucus plug (removed)  10/19 Extubated, PEG dislodged then replaced  Anticipated discharge: depending on patient's progress but hopefully by 10/25.  Assessment/Plan:    Principal Problem:  Acute respiratory failure with hypoxia (HCC) / Ventilatory dependent respiratory failure / Aspiration pneumonitis  - Patient initially under PCCM because he required ventilatory support. Transferred care to [facility] 10/21.  - S/P bronch 10/17, BAL negative.  - Respiratory status remains stable - We will continue prednisone 20 mg per tube - Suctioning as needed. He has received appropriate abx for aspiration pneumonitis  - Continue aspiration precaution  - Continue xopenex every 8 hours  Active Problems:  Sepsis secondary to UTI ([place]) due to urinary indwelling Foley catheter ([place]) / Neurogenic bladder / Leukocytosis - Please note sepsis criteria met on admission with fever of 103 F, HR 122, RR 20, hypoxia of 88%, leukocytosis of 22.5. - Suspected source of infection was catheter associated UTI (considering UA on admission showed large leukocytes and many bacteria in patient with chronic indwelling foley catheter for nerogenic bladder).  - Has  completed appropriate course of abx (please see in antiinfectives section below) - His urine culture grew multiple species none predominant - Blood cultures were negative - Resp culture from suctioning showed no growth  - BAL 10/17 - negative    Quadriparesis (muscle weakness) (HCC) - Stable  - Due to MS    History of malignant hyperthermia - Continue dantrolene  - No fevers overnight    Metabolic encephalopathy / Seizures  - No reports of seizures - Continue Keppra - Continue Wellbutrin and baclofen    FTT (failure to thrive) in adult / Moderate protein calorie malnutrition  - In the context of chronic illness  - Continue tube feeds with free water flushes  - Continue aspiration precautions    Hyponatremia, hypokalemia - Due to poor intake - BMP pending this am   Sacral decubitus ulcer, stage II - WOC consulted  - Non-healing full thickness pressure ulcer at the right posterior patella (knee) in a badly contracted RLE - Measurement:1.2cm x 1.4cm x 0.4cm - Dressing procedure/placement/frequency: Family reported that they do wound care daily with silver alginate   DVT Prophylaxis  - SCD's bilaterally   Code Status: Full.  Family Communication:  Family not at the bedside  Disposition Plan: Possibly by 10/25   IV access:  Peripheral IV  Procedures and diagnostic studies:    Dg Chest 2 View 10/20/2014 No active cardiopulmonary disease.    Dg Chest Port 1 View 10/28/2014 Low lung volumes and mild bibasilar atelectasis, without significant change. Persistent colonic dilatation noted.   Dg Chest Port 1 View 10/27/2014 Endotracheal tube and central line in stable position. 2. Low lung volumes.   Dg Chest Port 1 View 10/26/2014 No change in position of the endotracheal tube and left IJ  central venous line. 2. No change in bibasilar volume loss and considerable gaseous distention of the colon.   Dg Chest Port 1 View 10/21/2014 Right lower lobe pneumonia or  aspiration. 2. Low lung volumes with moderate pulmonary vascular congestion. 3. Moderate gaseous distention of the bowel in the right upper quadrant, mostly colon.   Dg Chest Port 1 View 10/21/2014 Interval increased bilateral interstitial markings. Consider viral/atypical pneumonia in this setting. Main differential consideration is pulmonary vascular congestion.   Dg Abd Portable 1v 10/27/2014 Gastrostomy tube is located within the gastric lumen. Electronically Signed By: Lavonia Dana M.D. On: 10/27/2014 16:49   Dg Abd Portable 1v 10/27/2014 Diffuse gaseous distention of colon, likely patient's baseline. Colonic ileus is the main differential consideration and follow-up KUB could document stability if there are concerning clinical features.   IAnti-Infectives:   10/12 vanc >> stop 10/17 10/12 ceftaz >> 10/13 (dc and changed to Zosyn to cover possible aspiration) 10/13 zosyn >> 10/18   Leisa Lenz, MD  [facility] Pager [phone]  Time spent in minutes: 25 minutes  If 7PM-7AM, please contact night-coverage [email or web address] Password [facility] 11/01/2014, 8:56 AM   LOS: 12 days    HPI/Subjective: No acute overnight events. No vomiting.   Objective: Filed Vitals:   10/31/14 1500 10/31/14 2009 11/01/14 0428 11/01/14 0716  BP: 99/66 100/62 97/67   Pulse: 104 107 112 117  Temp: 98.2 F (36.8 C) 98.4 F (36.9 C) 99 F (37.2 C)   TempSrc: Axillary Axillary Axillary   Resp: 16 16 16 18   Height:      Weight:   71.1 kg (156 lb 12 oz)   SpO2: 97% 100% 100% 97%    Intake/Output Summary (Last 24 hours) at 11/01/14 0856 Last data filed at 11/01/14 0433  Gross per 24 hour  Intake      0 ml  Output    950 ml  Net   -950 ml    Exam:   General:  Pt is not in acute distress  Cardiovascular: Regular rate and rhythm, S1/S2 (+)  Respiratory: diminished breath sounds, no wheezing   Abdomen: Soft, non tender, (+) PEG in place, bowel sounds present  Extremities: pulses DP  and PT palpable bilaterally  Neuro: Grossly nonfocal  Data Reviewed: Basic Metabolic Panel:  Recent Labs Lab 10/26/14 0525 10/27/14 0543 10/28/14 0500 10/29/14 0510 10/30/14 0425  NA 141 137 135 133* 131*  K 3.2* 3.6 3.3* 3.4* 4.0  CL 114* 105 102 100* 100*  CO2 20* 20* 24 23 21*  GLUCOSE 134* 121* 102* 94 112*  BUN 15 19 12 13 12   CREATININE 0.42* 0.58* 0.48* 0.36* 0.51*  CALCIUM 8.5* 8.8* 8.7* 8.6* 8.7*   Liver Function Tests: No results for input(s): AST, ALT, ALKPHOS, BILITOT, PROT, ALBUMIN in the last 168 hours. No results for input(s): LIPASE, AMYLASE in the last 168 hours. No results for input(s): AMMONIA in the last 168 hours. CBC:  Recent Labs Lab 10/26/14 0525 10/27/14 0543 10/28/14 0500 10/29/14 0510 10/30/14 0425  WBC 16.1* 23.6* 17.4* 16.6* 18.4*  NEUTROABS 13.2*  --  11.5* 9.7*  --   HGB 10.6* 10.9* 11.4* 10.1* 12.0*  HCT 30.5* 32.2* 33.6* 30.1* 36.0*  MCV 89.4 91.7 91.1 92.9 93.3  PLT 226 293 334 388 455*   Cardiac Enzymes: No results for input(s): CKTOTAL, CKMB, CKMBINDEX, TROPONINI in the last 168 hours. BNP: Invalid input(s): POCBNP CBG:  Recent Labs Lab 10/31/14 0801 10/31/14 1158 10/31/14 1649 10/31/14 2013 10/31/14 2353  GLUCAP 104* 143* 174* 136* 95    Culture, respiratory (NON-Expectorated)     Status: None   Collection Time: 10/23/14 10:32 PM  Result Value Ref Range Status   Specimen Description TRACHEAL ASPIRATE  Final   Special Requests Normal  Final   Gram Stain   Final   Culture   Final    NO GROWTH 2 DAYS    Report Status 10/26/2014 FINAL  Final  Culture, respiratory (NON-Expectorated)     Status: None   Collection Time: 10/25/14 11:49 AM  Result Value Ref Range Status   Specimen Description BRONCHIAL WASHINGS RIGHT  Final   Special Requests Normal  Final   Gram Stain   Final   Culture   Final    NO GROWTH 2 DAYS    Report Status 10/28/2014 FINAL  Final     Scheduled Meds: . acidophilus  1 capsule Oral  Daily  . antiseptic oral rinse  7 mL Mouth Rinse q12n4p  . ascorbic acid  500 mg Per Tube BID  . baclofen  20 mg Per Tube TID  . buPROPion  75 mg Per Tube TID  . chlorhexidine  15 mL Mouth Rinse BID  . cholecalciferol  5,000 Units Oral Daily  . dantrolene  200 mg Oral QHS  . free water  100 mL Per Tube 4 times per day  . guaiFENesin  10 mL Oral 3 times per day  . insulin aspart  0-15 Units Subcutaneous 6 times per day  . levalbuterol  0.63 mg Nebulization TID  . levETIRAcetam  1,000 mg Per Tube BID  . loratadine  10 mg Oral Daily  . multivitamin  5 mL Per Tube Daily  . nystatin  1 g Topical Daily  . pantoprazole sodium  40 mg Per Tube Daily  . polyethylene glycol  17 g Per Tube Daily  . polyvinyl alcohol  1 drop Both Eyes BID  . predniSONE  20 mg Per Tube Q breakfast  . senna  2 tablet Oral Daily  . sodium chloride  3 mL Intravenous Q12H   Continuous Infusions: . feeding supplement (OSMOLITE 1.5 CAL) 1,000 mL (10/30/14 1752)

## 2014-11-02 DIAGNOSIS — T883XXD Malignant hyperthermia due to anesthesia, subsequent encounter: Secondary | ICD-10-CM

## 2014-11-02 LAB — GLUCOSE, CAPILLARY
GLUCOSE-CAPILLARY: 112 mg/dL — AB (ref 65–99)
GLUCOSE-CAPILLARY: 126 mg/dL — AB (ref 65–99)
GLUCOSE-CAPILLARY: 93 mg/dL (ref 65–99)
Glucose-Capillary: 134 mg/dL — ABNORMAL HIGH (ref 65–99)

## 2014-11-02 MED ORDER — PANTOPRAZOLE SODIUM 40 MG PO PACK: 40.0000 mg | PACK | Freq: Every day | ORAL | Status: DC

## 2014-11-02 MED ORDER — PREDNISONE 5 MG/ML PO CONC: 10.0000 mg | Freq: Every day | ORAL | Status: DC

## 2014-11-02 MED ORDER — OSMOLITE 1.5 CAL PO LIQD: 1000.0000 mL | ORAL | Status: DC

## 2014-11-02 MED ORDER — METOPROLOL TARTRATE 25 MG/10 ML ORAL SUSPENSION: 12.5000 mg | Freq: Two times a day (BID) | ORAL | Status: DC

## 2014-11-02 MED ORDER — ONDANSETRON HCL 4 MG PO TABS: 4.0000 mg | ORAL_TABLET | Freq: Four times a day (QID) | ORAL | Status: DC | PRN

## 2014-11-02 MED ORDER — CETYLPYRIDINIUM CHLORIDE 0.05 % MT LIQD: 7.0000 mL | Freq: Two times a day (BID) | OROMUCOSAL | Status: DC

## 2014-11-02 NOTE — Discharge Summary (Signed)
Physician Discharge Summary  Terry Palmer IRW:[id number] DOB: Jun 17, 1975 DOA: 10/20/2014  PCP: Reymundo Poll, MD  Admit date: 10/20/2014 Discharge date: 11/02/2014  Recommendations for Outpatient Follow-up:  1. Continue meds as per prior to this admission 2. No need for abx on discharge, has completed abx in hospital   Discharge Diagnoses:  Principal Problem:   Acute respiratory failure with hypoxia ([place]) Active Problems:   Sepsis secondary to UTI [facility])   UTI (urinary tract infection) due to urinary indwelling Foley catheter (HCC)   Aspiration pneumonitis (HCC)   Quadriparesis (muscle weakness) (HCC)   Multiple sclerosis (HCC)   FTT (failure to thrive) in adult   Sacral decubitus ulcer, stage II   Neurogenic bladder   Leukocytosis   Seizure disorder (HCC)   Hyperthermia, malignant   Protein-calorie malnutrition, severe ([place])    Discharge Condition: stable   Diet recommendation: as tolerated   History of present illness:  39 y/o w MS and significant debilitation including quadroplegia, dysphagia. Admitted with suspected UTI / suprapubic cath infxn. Developed resp distress and hypoxemia 10/13 w inability to manage secretions. Pt was intubated. PCCM as priamry team and care transferred to [facility] 10/21.  SIGNIFICANT EVENTS: 10/12 Admitted to hospital 10/13 Hypoxic and poor airway compliance, intubated 10/16 Severe vent dyssynchrony this morning, thick secretions 10/17 Improved vent synchrony, thick secretions, off levophed. FOB with R bronchus intermedius mucus plug (removed)  10/19 Extubated, PEG dislodged then replaced  Hospital Course:  Assessment/Plan:    Principal Problem:  Acute respiratory failure with hypoxia ([place]) / Ventilatory dependent respiratory failure / Aspiration pneumonitis  - Patient initially under PCCM because he required ventilatory support. Transferred care to [facility] 10/21.  - S/P bronch 10/17, BAL negative.  - Status respiratory  remains stable - Continue prednisone 10 mg per tube one more day on discharge and then stop - Suctioning as needed. He has received appropriate abx for aspiration pneumonitis   Active Problems:  Sepsis secondary to UTI [facility]) due to urinary indwelling Foley catheter ([place]) / Neurogenic bladder / Leukocytosis - Please note sepsis criteria met on admission with fever of 103 F, HR 122, RR 20, hypoxia of 88%, leukocytosis of 22.5. - Suspected source of infection was catheter associated UTI (considering UA on admission showed large leukocytes and many bacteria in patient with chronic indwelling foley catheter for nerogenic bladder).  - Pt has completed appropriate course of abx (please see in antiinfectives section below) - His urine culture grew multiple species none predominant - Blood cultures negative - Resp culture showed no growth  - BAL 10/17 - negative    Quadriparesis (muscle weakness) (HCC) - Stable    History of malignant hyperthermia - Continue dantrolene    Metabolic encephalopathy / Seizures  - Continue Keppra - Continue Wellbutrin and baclofen    FTT (failure to thrive) in adult / Moderate protein calorie malnutrition  - In the context of chronic illness  - Continue tube feeds with free water flushes    Hyponatremia, hypokalemia - Due to poor intake - Electrolytes normalized    Sacral decubitus ulcer, stage II - WOC consulted  - Non-healing full thickness pressure ulcer at the right posterior patella (knee) in a badly contracted RLE - Measurement:1.2cm x 1.4cm x 0.4cm - Dressing procedure/placement/frequency: Family reported that they do wound care daily with silver alginate   DVT Prophylaxis  - SCD's bilaterally in hospital   Code Status: Full.  Family Communication: Family not at the bedside; spoke with his mom at the  bedside today and she agrees with discharge plan today    IV access:  Peripheral IV  Procedures and diagnostic  studies:   Dg Chest 2 View 10/20/2014 No active cardiopulmonary disease.   Dg Chest Port 1 View 10/28/2014 Low lung volumes and mild bibasilar atelectasis, without significant change. Persistent colonic dilatation noted.   Dg Chest Port 1 View 10/27/2014 Endotracheal tube and central line in stable position. 2. Low lung volumes.   Dg Chest Port 1 View 10/26/2014 No change in position of the endotracheal tube and left IJ central venous line. 2. No change in bibasilar volume loss and considerable gaseous distention of the colon.   Dg Chest Port 1 View 10/21/2014 Right lower lobe pneumonia or aspiration. 2. Low lung volumes with moderate pulmonary vascular congestion. 3. Moderate gaseous distention of the bowel in the right upper quadrant, mostly colon.   Dg Chest Port 1 View 10/21/2014 Interval increased bilateral interstitial markings. Consider viral/atypical pneumonia in this setting. Main differential consideration is pulmonary vascular congestion.   Dg Abd Portable 1v 10/27/2014 Gastrostomy tube is located within the gastric lumen. Electronically Signed By: Lavonia Dana M.D. On: 10/27/2014 16:49   Dg Abd Portable 1v 10/27/2014 Diffuse gaseous distention of colon, likely patient's baseline. Colonic ileus is the main differential consideration and follow-up KUB could document stability if there are concerning clinical features.   IAnti-Infectives:   10/12 vanc >> stop 10/17 10/12 ceftaz >> 10/13 (dc and changed to Zosyn to cover possible aspiration) 10/13 zosyn >> 10/18   Signed:  Leisa Lenz, MD  [facility] 11/02/2014, 10:37 AM  Pager #: [phone]  Time spent in minutes: more than 30 minutes   Discharge Exam: Filed Vitals:   11/02/14 0504  BP: 108/54  Pulse: 109  Temp: 97.4 F (36.3 C)  Resp: 18   Filed Vitals:   11/01/14 1500 11/01/14 2044 11/02/14 0504 11/02/14 0928  BP: 100/62 101/63 108/54   Pulse: 110 121 109   Temp: 98.6 F (37 C) 97.8  F (36.6 C) 97.4 F (36.3 C)   TempSrc: Axillary Axillary Axillary   Resp: 18 18 18    Height:      Weight:   70.2 kg (154 lb 12.2 oz)   SpO2: 97% 96% 98% 98%    General: Pt is alert,  not in acute distress Cardiovascular: Regular rate and rhythm, S1/S2 appreciated  Respiratory: No wheezing, no crackles, no rhonchi Abdominal: Soft, non tender, non distended, bowel sounds +, no guarding, peg in palce Extremities: no edema, no cyanosis, pulses palpable bilaterally DP and PT Neuro: Contracted extremities   Discharge Instructions  Discharge Instructions    Call MD for:  difficulty breathing, headache or visual disturbances    Complete by:  As directed      Call MD for:  persistant dizziness or light-headedness    Complete by:  As directed      Call MD for:  persistant nausea and vomiting    Complete by:  As directed      Call MD for:  severe uncontrolled pain    Complete by:  As directed      Diet - low sodium heart healthy    Complete by:  As directed      Increase activity slowly    Complete by:  As directed             Medication List    STOP taking these medications        feeding supplement (PRO-STAT SUGAR  FREE 64) Liqd     FLUCONAZOLE PO     fosfomycin 3 G Pack  Commonly known as:  MONUROL      TAKE these medications        acetaminophen 500 MG tablet  Commonly known as:  TYLENOL  1,000 mg by PEG Tube route 2 (two) times daily.     ACIDOPHILUS PO  Place 1 tablet into feeding tube as directed.     AFLURIA PRESERVATIVE FREE 0.5 ML Susy  Generic drug:  Influenza Virus Vacc Split PF  Inject 0.5 mLs into the skin once. For flu season     antiseptic oral rinse 0.05 % Liqd solution  Commonly known as:  CPC / CETYLPYRIDINIUM CHLORIDE 0.05%  7 mLs by Mouth Rinse route 2 times daily at 12 noon and 4 pm.     ascorbic acid 500 MG/5ML syrup  Commonly known as:  VITAMIN C  Place 5 mLs (500 mg total) into feeding tube 2 (two) times daily.     baclofen 20 MG  tablet  Commonly known as:  LIORESAL  Place 1 tablet (20 mg total) into feeding tube 3 (three) times daily.     bisacodyl 10 MG suppository  Commonly known as:  DULCOLAX  Place 10 mg rectally as needed for moderate constipation.     buPROPion 75 MG tablet  Commonly known as:  WELLBUTRIN  Place 1 tablet (75 mg total) into feeding tube 3 (three) times daily.     cholecalciferol 1000 UNITS tablet  Commonly known as:  VITAMIN D  Take 1 tablet (1,000 Units total) by mouth 2 (two) times daily.     Cranberry Juice Powder 425 MG Caps  1 capsule (425 mg total) by PEG Tube route 2 (two) times daily.     dantrolene 50 MG capsule  Commonly known as:  DANTRIUM  Take 200 mg by mouth at bedtime.     DESITIN EX  Apply 1 application topically as needed (may apply to minor skin breakdown until healed).     diazepam 5 MG tablet  Commonly known as:  VALIUM  Take 5 mg by mouth at bedtime.     feeding supplement (OSMOLITE 1.5 CAL) Liqd  Place 1,000 mLs into feeding tube continuous.     fexofenadine 180 MG tablet  Commonly known as:  ALLEGRA  Place 180 mg into feeding tube daily.     free water Soln  Place 240 mLs into feeding tube every 4 (four) hours.     HYDROcodone-acetaminophen 7.5-325 MG tablet  Commonly known as:  [place] 1 tablet into feeding tube every 6 (six) hours as needed (pain).     ipratropium-albuterol 0.5-2.5 (3) MG/3ML Soln  Commonly known as:  DUONEB  Take 3 mLs by nebulization 2 (two) times daily.     levETIRAcetam 100 MG/ML solution  Commonly known as:  KEPPRA  Place 1,000 mg into feeding tube 2 (two) times daily.     magnesium hydroxide 400 MG/5ML suspension  Commonly known as:  MILK OF MAGNESIA  Take 30 mLs by mouth every 12 (twelve) hours as needed for mild constipation.     metoprolol tartrate 25 mg/10 mL Susp  Commonly known as:  LOPRESSOR  Take 5 mLs (12.5 mg total) by mouth 2 (two) times daily.     multivitamin with minerals Tabs tablet  Place 1  tablet into feeding tube daily.     NALTREXONE HCL PO  Give 4.5 mg by tube at bedtime.  neomycin-bacitracin-polymyxin [phone] ointment  Apply 1 application topically daily as needed (for toes).     nystatin 100000 UNIT/GM Powd  Apply 1 g topically 3 (three) times daily.     ODORLESS GARLIC 174 MG Tabs  Generic drug:  Garlic Oil  0,814 mg by PEG Tube route daily.     omeprazole 2 mg/mL Susp  Commonly known as:  PRILOSEC  Place 20 mLs (40 mg total) into feeding tube daily.     ondansetron 4 MG tablet  Commonly known as:  ZOFRAN  Place 1 tablet (4 mg total) into feeding tube every 6 (six) hours as needed for nausea.     pantoprazole sodium 40 mg/20 mL Pack  Commonly known as:  PROTONIX  Place 20 mLs (40 mg total) into feeding tube daily.     polyethylene glycol packet  Commonly known as:  MIRALAX / GLYCOLAX  Place 17 g into feeding tube daily.     polyvinyl alcohol 1.4 % ophthalmic solution  Commonly known as:  LIQUIFILM TEARS  Place 1 drop into both eyes 3 (three) times daily.     predniSONE 5 MG/ML concentrated solution  Place 2 mLs (10 mg total) into feeding tube daily with breakfast.     senna 8.6 MG Tabs tablet  Commonly known as:  SENOKOT  Take 2 tablets by mouth daily.     sodium phosphate enema  Commonly known as:  FLEET  Place 1 enema rectally once as needed (for constipation). follow package directions           Follow-up Information    Follow up with Reymundo Poll, MD. Schedule an appointment as soon as possible for a visit in 1 week.   Specialty:  Family Medicine   Why:  Follow up appt after recent hospitalization   Contact information:   [address] [place] [id number] [phone]        The results of significant diagnostics from this hospitalization (including imaging, microbiology, ancillary and laboratory) are listed below for reference.    Significant Diagnostic Studies: Dg Chest 2 View  10/20/2014  CLINICAL  DATA:  Fever, seizure. EXAM: CHEST  2 VIEW COMPARISON:  June 29, 2013. FINDINGS: The heart size and mediastinal contours are within normal limits. Both lungs are clear. No pneumothorax or pleural effusion is noted. The visualized skeletal structures are unremarkable. IMPRESSION: No active cardiopulmonary disease. Electronically Signed   By: Marijo Conception, M.D.   On: 10/20/2014 13:24   Dg Chest Port 1 View  10/28/2014  CLINICAL DATA:  Acute respiratory failure with hypoxemia. EXAM: PORTABLE CHEST 1 VIEW COMPARISON:  10/27/2014 FINDINGS: Endotracheal tube is been removed. Left jugular central venous catheter remains in appropriate position. Low lung volumes are again demonstrated with mild bibasilar atelectasis. No evidence of pulmonary consolidation or pleural effusion. Heart size is normal. Colonic dilatation again noted. IMPRESSION: Low lung volumes and mild bibasilar atelectasis, without significant change. Persistent colonic dilatation noted. Electronically Signed   By: Earle Gell M.D.   On: 10/28/2014 07:36   Dg Chest Port 1 View  10/27/2014  CLINICAL DATA:  Acute respiratory failure EXAM: PORTABLE CHEST 1 VIEW COMPARISON:  10/26/2014 FINDINGS: Endotracheal tube tip in stable position between the clavicular heads and carina. Left IJ central line, tip at the upper cavoatrial junction. Unchanged low lung volumes with mild bibasilar atelectasis. There is no edema, consolidation, effusion, or pneumothorax. Gaseous distention of colon, stable. Normal heart size. IMPRESSION: 1. Endotracheal tube and central  line in stable position. 2. Low lung volumes. Electronically Signed   By: Monte Fantasia M.D.   On: 10/27/2014 07:27   Dg Chest Port 1 View  10/26/2014  CLINICAL DATA:  Acute respiratory failure with hypoxemia, history of MS, aspiration pneumonia, former smoker EXAM: PORTABLE CHEST 1 VIEW COMPARISON:  Portable chest x-ray of 10/25/2014 . FINDINGS: The endotracheal tube is unchanged in position with  the tip approximately 1.9 cm above the carina. The lungs remain suboptimally aerated with minimal basilar volume loss. Left IJ central venous line tip overlies the expected SVC -RA junction and is unchanged in position. Heart size is stable. Considerable gaseous distention of the colon is noted IMPRESSION: 1. No change in position of the endotracheal tube and left IJ central venous line. 2. No change in bibasilar volume loss and considerable gaseous distention of the colon. Electronically Signed   By: Ivar Drape M.D.   On: 10/26/2014 08:03   Dg Chest Port 1 View  10/25/2014  CLINICAL DATA:  Acute respiratory failure with hypoxemia. On ventilator. Urosepsis. Multiple sclerosis. EXAM: PORTABLE CHEST 1 VIEW COMPARISON:  10/24/2014 FINDINGS: Endotracheal tube and left jugular central venous catheter remain in appropriate position. Low lung volumes are seen with mild atelectasis at the right lung base. No evidence of pulmonary consolidation or edema. No pneumothorax visualized. Gaseous distention of colon noted. IMPRESSION: Low lung volumes and mild right basilar atelectasis, without significant change. Colonic distention again noted. Electronically Signed   By: Earle Gell M.D.   On: 10/25/2014 07:15   Dg Chest Port 1 View  10/24/2014  CLINICAL DATA:  Acute respiratory failure with hypoxemia Former smoker. EXAM: PORTABLE CHEST 1 VIEW COMPARISON:  1 day prior FINDINGS: Endotracheal tube terminates 4.7 cm above carina. Left internal jugular line tip at mid to low SVC. Patient rotated to the right. Normal heart size. No pleural effusion or pneumothorax. Right hemidiaphragm elevation is mild and slightly increased. Lung volumes are diminished. Right-sided pulmonary interstitial prominence is favored to be due to decreased volumes and perihilar/infrahilar atelectasis. Gas-filled colon, most apparent within the right upper quadrant. IMPRESSION: Right hemidiaphragm elevation which is progressive. Subdiaphragmatic  gas-filled colon. Correlate with abdominal symptoms. Volume loss throughout the right hemi thorax is new. Apparent pulmonary interstitial prominence is favored to be due to vascular crowding and areas of atelectasis. Recommend attention on follow-up. Electronically Signed   By: Abigail Miyamoto M.D.   On: 10/24/2014 07:34   Dg Chest Port 1 View  10/23/2014  CLINICAL DATA:  39 year old male with history of acute respiratory failure. Aspiration pneumonia. EXAM: PORTABLE CHEST 1 VIEW COMPARISON:  Chest x-ray 10/22/2014. FINDINGS: An endotracheal tube is in place with tip 5.0 cm above the carina. There is a left-sided internal jugular central venous catheter with tip terminating in the distal superior vena cava. Lung volumes are low. No consolidative airspace disease. No pleural effusions. No pneumothorax. No pulmonary nodule or mass noted. Pulmonary vasculature and the cardiomediastinal silhouette are within normal limits. IMPRESSION: 1. Support apparatus, as above. 2. Low lung volumes without radiographic evidence of acute cardiopulmonary disease. Electronically Signed   By: Vinnie Langton M.D.   On: 10/23/2014 07:14   Dg Chest Port 1 View  10/22/2014  CLINICAL DATA:  Respiratory failure. EXAM: PORTABLE CHEST 1 VIEW COMPARISON:  10/21/2014. FINDINGS: Endotracheal tube, left IJ line stable position. Heart size normal. Continued improved aeration with mild bibasilar infiltrates. No pleural effusion or pneumothorax. IMPRESSION: 1. Lines and tubes in stable position. 2. Continued improved  aeration with mild bibasilar infiltrates . Electronically Signed   By: Marcello Moores  Register   On: 10/22/2014 07:25   Dg Chest Port 1 View  10/21/2014  CLINICAL DATA:  Mechanical respiratory failure EXAM: PORTABLE CHEST 1 VIEW COMPARISON:  Earlier today FINDINGS: Improved aeration after intubation. Endotracheal tube tip is between the clavicular heads and carina. The orogastric tube is coiled in the stomach. Left IJ central line,  tip at the distal SVC. Persistent right basilar opacity with volume loss and air bronchograms. Probable small right pleural effusion. No pneumothorax. No pulmonary edema. IMPRESSION: 1. Unremarkable positioning of new tubes and central line. 2. Improved aeration after intubation. 3. Right basilar atelectasis and/or pneumonia. Electronically Signed   By: Monte Fantasia M.D.   On: 10/21/2014 12:28   Dg Chest Port 1 View  10/21/2014  CLINICAL DATA:  Multiple sclerosis. Fever, sepsis and shortness of breath. EXAM: PORTABLE CHEST - 1 VIEW COMPARISON:  One-view chest x-ray 10/20/2014 FINDINGS: The lungs volumes are low. The heart size is normal. Right lower lobe pneumonia or aspiration is present. Moderate pulmonary vascular congestion is present. A small right pleural effusion may be present. There is no definite left pleural effusion. Moderate distention of the colon is evident below the right hemidiaphragm. IMPRESSION: 1. Right lower lobe pneumonia or aspiration. 2. Low lung volumes with moderate pulmonary vascular congestion. 3. Moderate gaseous distention of the bowel in the right upper quadrant, mostly colon. Electronically Signed   By: San Morelle M.D.   On: 10/21/2014 10:05   Dg Chest Port 1 View  10/21/2014  CLINICAL DATA:  39 year old male with fever and abnormal pulmonary auscultation. Initial encounter. EXAM: PORTABLE CHEST 1 VIEW COMPARISON:  10/20/2014 and earlier. FINDINGS: Portable AP semi upright view at 2355 hours. Stable somewhat low lung volumes. Normal cardiac size and mediastinal contours. No pneumothorax, pleural effusion or confluent pulmonary opacity. Increased pulmonary interstitial bilaterally. No overt edema. Continued gaseous distention of colon in the visualized upper abdomen. IMPRESSION: Interval increased bilateral interstitial markings. Consider viral/atypical pneumonia in this setting. Main differential consideration is pulmonary vascular congestion. Electronically  Signed   By: Genevie Ann M.D.   On: 10/21/2014 00:13   Dg Abd Portable 1v  10/27/2014  CLINICAL DATA:  PEG tube placement EXAM: PORTABLE ABDOMEN - 1 VIEW COMPARISON:  Portable exam 1623 hours compared to 0522 hours FINDINGS: 50 cc of Omnipaque was injected into the PEG tube and a single image of the abdomen was obtained. Contrast opacifies the gastric lumen and the duodenum to the ligament of Treitz. No definite contrast extravasation identified. Gaseous distention of colon again seen. IMPRESSION: Gastrostomy tube is located within the gastric lumen. Electronically Signed   By: Lavonia Dana M.D.   On: 10/27/2014 16:49   Dg Abd Portable 1v  10/27/2014  CLINICAL DATA:  Multiple sclerosis and quadriparesis. Abdominal distention. EXAM: PORTABLE ABDOMEN - 1 VIEW COMPARISON:  06/29/2013 abdominal CT FINDINGS: Gas distended colon, similar to previous CT. There is no indication of volvulus or obstruction. No small bowel distention. Percutaneous gastrostomy tube. Chronic appearing fracture of the right femoral neck. There is chronic subluxation and severe degeneration of the nonfunctional bilateral hips. Suprapubic catheter with similar appearance of fat planes compared to abdominal CT 06/29/2013. IMPRESSION: Diffuse gaseous distention of colon, likely patient's baseline. Colonic ileus is the main differential consideration and follow-up KUB could document stability if there are concerning clinical features. Electronically Signed   By: Monte Fantasia M.D.   On: 10/27/2014 07:25  Microbiology: Recent Results (from the past 240 hour(s))  Culture, respiratory (NON-Expectorated)     Status: None   Collection Time: 10/23/14 10:32 PM  Result Value Ref Range Status   Specimen Description TRACHEAL ASPIRATE  Final   Special Requests Normal  Final   Gram Stain   Final    FEW WBC PRESENT,BOTH PMN AND MONONUCLEAR NO ORGANISMS SEEN Performed at [facility]    Culture   Final    NO GROWTH 2 DAYS Performed  at [facility]    Report Status 10/26/2014 FINAL  Final  Culture, respiratory (NON-Expectorated)     Status: None   Collection Time: 10/25/14 11:49 AM  Result Value Ref Range Status   Specimen Description BRONCHIAL WASHINGS RIGHT  Final   Special Requests Normal  Final   Gram Stain   Final    RARE WBC PRESENT, PREDOMINANTLY PMN NO SQUAMOUS EPITHELIAL CELLS SEEN NO ORGANISMS SEEN Performed at [facility]    Culture   Final    NO GROWTH 2 DAYS Performed at [facility]    Report Status 10/28/2014 FINAL  Final     Labs: Basic Metabolic Panel:  Recent Labs Lab 10/27/14 0543 10/28/14 0500 10/29/14 0510 10/30/14 0425 11/01/14 1030  NA 137 135 133* 131* 139  K 3.6 3.3* 3.4* 4.0 3.8  CL 105 102 100* 100* 105  CO2 20* 24 23 21* 24  GLUCOSE 121* 102* 94 112* 112*  BUN 19 12 13 12 18   CREATININE 0.58* 0.48* 0.36* 0.51* 0.48*  CALCIUM 8.8* 8.7* 8.6* 8.7* 9.4   Liver Function Tests: No results for input(s): AST, ALT, ALKPHOS, BILITOT, PROT, ALBUMIN in the last 168 hours. No results for input(s): LIPASE, AMYLASE in the last 168 hours. No results for input(s): AMMONIA in the last 168 hours. CBC:  Recent Labs Lab 10/27/14 0543 10/28/14 0500 10/29/14 0510 10/30/14 0425 11/01/14 1030  WBC 23.6* 17.4* 16.6* 18.4* 20.1*  NEUTROABS  --  11.5* 9.7*  --   --   HGB 10.9* 11.4* 10.1* 12.0* 12.3*  HCT 32.2* 33.6* 30.1* 36.0* 36.4*  MCV 91.7 91.1 92.9 93.3 93.8  PLT 293 334 388 455* 475*   Cardiac Enzymes: No results for input(s): CKTOTAL, CKMB, CKMBINDEX, TROPONINI in the last 168 hours. BNP: BNP (last 3 results) No results for input(s): BNP in the last 8760 hours.  ProBNP (last 3 results) No results for input(s): PROBNP in the last 8760 hours.  CBG:  Recent Labs Lab 11/01/14 1703 11/01/14 2049 11/02/14 0005 11/02/14 0448 11/02/14 0809  GLUCAP 146* 126* 93 112* 134*

## 2014-11-02 NOTE — Progress Notes (Signed)
CSW received consult for ambulance transport for pt.  BSW Intern met with pt and pt wife at bedside to confirm address.   CSW called [facility].  No further SW needs identified at this time.  Signing off, Harlon Flor, [facility]  [phone]

## 2014-11-02 NOTE — Progress Notes (Signed)
Discharge summary faxed [phone]) to private duty nursing company "[employer]" per request of wife. This CM called to confirm pt did have services with [employer].  Marney Doctor RN,BSN,NCM [phone]

## 2014-11-02 NOTE — Discharge Instructions (Signed)
Respiratory failure is when your lungs are not working well and your breathing (respiratory) system fails. When respiratory failure occurs, it is difficult for your lungs to get enough oxygen, get rid of carbon dioxide, or both. Respiratory failure can be life threatening.  °Respiratory failure can be acute or chronic. Acute respiratory failure is sudden, severe, and requires emergency medical treatment. Chronic respiratory failure is less severe, happens over time, and requires ongoing treatment.  °WHAT ARE THE CAUSES OF ACUTE RESPIRATORY FAILURE?  °Any problem affecting the heart or lungs can cause acute respiratory failure. Some of these causes include the following: °· Chronic bronchitis and emphysema (COPD).   °· Blood clot going to a lung (pulmonary embolism).   °· Having water in the lungs caused by heart failure, lung injury, or infection (pulmonary edema).   °· Collapsed lung (pneumothorax).   °· Pneumonia.   °· Pulmonary fibrosis.   °· Obesity.   °· Asthma.   °· Heart failure.   °· Any type of trauma to the chest that can make breathing difficult.   °· Nerve or muscle diseases making chest movements difficult. °HOW WILL MY ACUTE RESPIRATORY FAILURE BE TREATED?  °Treatment of acute respiratory failure depends on the cause of the respiratory failure. Usually, you will stay in the intensive care unit so your breathing can be watched closely. Treatment can include the following: °· Oxygen. Oxygen can be delivered through the following: °¨ Nasal cannula. This is small tubing that goes in your nose to give you oxygen. °¨ Face mask. A face mask covers your nose and mouth to give you oxygen. °· Medicine. Different medicines can be given to help with breathing. These can include: °¨ Nebulizers. Nebulizers deliver medicines to open the air passages (bronchodilators). These medicines help to open or relax the airways in the lungs so you can breathe better. They can also help loosen mucus from your  lungs. °¨ Diuretics. Diuretic medicines can help you breathe better by getting rid of extra water in your body. °¨ Steroids. Steroid medicines can help decrease swelling (inflammation) in your lungs. °¨ Antibiotics. °· Chest tube. If you have a collapsed lung (pneumothorax), a chest tube is placed to help reinflate the lung. °· Noninvasive positive pressure ventilation (NPPV). This is a tight-fitting mask that goes over your nose and mouth. The mask has tubing that is attached to a machine. The machine blows air into the tubing, which helps to keep the tiny air sacs (alveoli) in your lungs open. This machine allows you to breathe on your own. °· Ventilator. A ventilator is a breathing machine. When on a ventilator, a breathing tube is put into the lungs. A ventilator is used when you can no longer breathe well enough on your own. You may have low oxygen levels or high carbon dioxide (CO2) levels in your blood. When you are on a ventilator, sedation and pain medicines are given to make you sleep so your lungs can heal. °SEEK IMMEDIATE MEDICAL CARE IF: °· You have shortness of breath (dyspnea) with or without activity. °· You have rapid breathing (tachypnea). °· You are wheezing. °· You are unable to say more than a few words without having to catch your breath. °· You find it very difficult to function normally. °· You have a fast heart rate. °· You have a bluish color to your finger or toe nail beds. °· You have confusion or drowsiness or both. °  °This information is not intended to replace advice given to you by your health care provider. Make sure you discuss   any questions you have with your health care provider. °  °Document Released: 12/30/2012 Document Revised: 09/15/2014 Document Reviewed: 12/30/2012 °Elsevier Interactive Patient Education ©2016 Elsevier Inc. ° °

## 2014-11-02 NOTE — Care Management Note (Signed)
Case Management Note  Patient Details  Name: Terry Palmer MRN: [id number] Date of Birth: [birth year]   Expected Discharge Date:                  Expected Discharge Plan:  Home/Self Care  In-House Referral:  NA  Discharge planning Services  CM Consult  Post Acute Care Choice:  NA Choice offered to:  NA  DME Arranged:  N/A DME Agency:  NA  HH Arranged:  NA HH Agency:  NA  Status of Service:  In process, will continue to follow  Medicare Important Message Given:  Yes-second notification given Date Medicare IM Given:    Medicare IM give by:    Date Additional Medicare IM Given:    Additional Medicare Important Message give by:     If discussed at [place] of Stay Meetings, dates discussed:    Additional Comments: Pt is from home with wife and they also have private duty RN/Aide as well. This CM met with pt and wife at bedside to discuss dc needs. Pt wife states that they have adequate help at home with him and they have all the equipment they need. No other CM needs identified. [name], [name], RN 11/02/2014, 11:00 AM

## 2014-12-14 ENCOUNTER — Ambulatory Visit: Payer: Medicare HMO | Admitting: Neurology

## 2014-12-23 ENCOUNTER — Other Ambulatory Visit: Payer: Self-pay | Admitting: *Deleted

## 2014-12-23 NOTE — Patient Outreach (Signed)
[facility]) Care Management  12/23/2014  Terry Palmer 1975-06-26 [id number]  Referral from [facility] Tier 4 List:  Telephone call to patient; left message on voice mail requesting return call.  Plan: will follow up.  Sherrin Daisy, RN BSN [place] Management Coordinator [facility] Care Management  [phone]

## 2014-12-24 ENCOUNTER — Encounter: Payer: Self-pay | Admitting: Neurology

## 2014-12-24 ENCOUNTER — Ambulatory Visit (INDEPENDENT_AMBULATORY_CARE_PROVIDER_SITE_OTHER): Payer: Medicare HMO | Admitting: Neurology

## 2014-12-24 VITALS — BP 108/75 | HR 89

## 2014-12-24 DIAGNOSIS — G35 Multiple sclerosis: Secondary | ICD-10-CM

## 2014-12-24 DIAGNOSIS — G8250 Quadriplegia, unspecified: Secondary | ICD-10-CM

## 2014-12-24 DIAGNOSIS — F0280 Dementia in other diseases classified elsewhere without behavioral disturbance: Secondary | ICD-10-CM

## 2014-12-24 DIAGNOSIS — G40909 Epilepsy, unspecified, not intractable, without status epilepticus: Secondary | ICD-10-CM | POA: Diagnosis not present

## 2014-12-24 DIAGNOSIS — N319 Neuromuscular dysfunction of bladder, unspecified: Secondary | ICD-10-CM | POA: Diagnosis not present

## 2014-12-24 HISTORY — DX: Quadriplegia, unspecified: G82.50

## 2014-12-24 HISTORY — DX: Dementia in other diseases classified elsewhere, unspecified severity, without behavioral disturbance, psychotic disturbance, mood disturbance, and anxiety: F02.80

## 2014-12-24 MED ORDER — TIZANIDINE HCL 2 MG PO TABS: 2.0000 mg | ORAL_TABLET | Freq: Four times a day (QID) | ORAL | Status: DC | PRN

## 2014-12-24 NOTE — Patient Instructions (Signed)
Multiple Sclerosis °Multiple sclerosis (MS) is a disease of the central nervous system. It leads to the loss of the insulating covering of the nerves (myelin sheath) of your brain. When this happens, brain signals do not get sent properly or may not get sent at all. The age of onset of MS varies.  °CAUSES °The cause of MS is unknown. However, it is more common in the northern [place] than in the southern [place]. °RISK FACTORS °There is a higher number of women with MS than men. MS is not an illness that is passed down to you from your family members (inherited). However, your risk of MS is higher if you have a relative with MS. °SIGNS AND SYMPTOMS  °The symptoms of MS occur in episodes or attacks. These attacks may last weeks to months. There may be long periods of almost no symptoms between attacks. The symptoms of MS vary. This is because of the many different ways it affects the central nervous system. The main symptoms of MS include: °· Vision problems and eye pain. °· Numbness. °· Weakness. °· Inability to move your arms, hands, feet, or legs (paralysis). °· Balance problems. °· Tremors. °DIAGNOSIS  °Your health care provider can diagnose MS with the help of imaging exams and lab tests. These may include specialized X-ray exams and spinal fluid tests. The best imaging exam to confirm a diagnosis of MS is an MRI. °TREATMENT  °There is no known cure for MS, but there are medicines that can decrease the number and frequency of attacks. Steroids are often used for short-term relief. Physical and occupational therapy may also help. There are also many new alternative or complementary treatments available to help control the symptoms of MS. Ask your health care provider if any of these other options are right for you. °HOME CARE INSTRUCTIONS  °· Take medicines as directed by your health care provider. °· Exercise as directed by your health care provider. °SEEK MEDICAL CARE IF: °You begin to feel  depressed. °SEEK IMMEDIATE MEDICAL CARE IF: °· You develop paralysis. °· You have problems with bladder, bowel, or sexual function. °· You develop mental changes, such as forgetfulness or mood swings. °· You have a period of uncontrolled movements (seizure). °  °This information is not intended to replace advice given to you by your health care provider. Make sure you discuss any questions you have with your health care provider. °  °Document Released: 12/23/1999 Document Revised: 12/30/2012 Document Reviewed: 09/01/2012 °Elsevier Interactive Patient Education ©2016 Elsevier Inc. ° °

## 2014-12-24 NOTE — Progress Notes (Signed)
Reason for visit: Multiple sclerosis  Referring physician: Dr. Daisy Palmer is a 39 y.o. male  History of present illness:  [name] is a 39 year old right-handed white male with a history of multiple sclerosis. The patient was seen approximately 14 years ago with the diagnosis, and medical therapy was recommended at that time. The patient never followed up regularly, and never went on any disease modifying medications. Unfortunately, he has progressed dramatically with his multiple sclerosis to the point where he is now end-stage. He was last seen in September 2013, he was quadriparetic at that time, he was seen after he had suffered a recent seizure. The patient currently is on Keppra, his last seizure occurred in August 2016. The patient had a staring episode with rhythmic jaw and face twitching. The patient was just recently in the hospital on 10/20/2014 with urinary sepsis and aspiration pneumonia. The patient has a gastrostomy tube in and a suprapubic catheter in place. The patient is bedridden, has flexion contractures of the elbows, and knees, and probably at the hips as well. The patient has had bedsores, but currently does not have any skin issues. The patient has an air mattress at home to prevent skin issues. The patient has nursing care coming in on a regular basis. The wife and the nursing assistant indicate that he is fully conscious, he can communicate with eye blinks, once for yes and twice for no. They indicate that he watches TV and listens to music during the day, but he can do little else. He is on low-dose prednisone at this time taking 5 mg daily. He has been placed on baclofen 20 mg 4 times daily, but this has not helped the issues with the mobility in the legs. The patient returns for an evaluation. In September 2013 we discussed the possibility of a baclofen pump placement, they did not wish to consider it at that time. The wife indicates that this is what they  want done now.  Past Medical History  Diagnosis Date  . MS (multiple sclerosis) ([place])   . Quadriparesis (muscle weakness) ([place]) 03/12/2011  . Childhood asthma   . Depression   . Neuromuscular disorder ([place])     Quadraperesis  . Recurrent UTI   . Dysphagia   . Bladder calculi   . Neurogenic bladder   . Shortness of breath   . Recurrent upper respiratory infection (URI)   . Normocytic anemia 05/28/2011  . Aspiration pneumonia ([place]) 01/20/2013  . 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 Quadriplegia and quadriparesis ([place]) 12/24/2014  . 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 Lumbar puncture  10/12/2002  . Gastrostomy  04/16/2011    Procedure: GASTROSTOMY;  Surgeon: Zenovia Jarred, MD;  Location: [facility] OR;  Service: General;  Laterality: N/A;  Open G-Tube placement    Family History  Problem Relation Age of Onset  . Asthma Mother     Social history:  reports that he quit smoking about 15 years ago. His smoking use included Cigarettes. He has quit using smokeless tobacco. He reports that he does not drink alcohol. His drug history is not on file.  Medications:  Prior to Admission medications   Medication Sig Start Date End Date Taking? Authorizing Provider  acetaminophen (TYLENOL) 500 MG tablet 1,000 mg by PEG Tube route 2 (two) times daily.   Yes Historical Provider, MD  ascorbic acid (VITAMIN C) 500 MG/5ML syrup Place  5 mLs (500 mg total) into feeding tube 2 (two) times daily. 01/24/13  Yes Bonnielee Haff, MD  baclofen (LIORESAL) 20 MG tablet Place 1 tablet (20 mg total) into feeding tube 3 (three) times daily. 01/24/13  Yes Bonnielee Haff, MD  bisacodyl (DULCOLAX) 10 MG suppository Place 10 mg rectally as needed for moderate constipation.   Yes Historical Provider, MD  buPROPion (WELLBUTRIN) 75 MG tablet Place 1 tablet (75 mg total) into feeding tube 3 (three) times daily. 01/24/13  Yes Bonnielee Haff, MD    cholecalciferol (VITAMIN D) 1000 UNITS tablet Take 1 tablet (1,000 Units total) by mouth 2 (two) times daily. Patient taking differently: Take 5,000 Units by mouth daily.  01/24/13  Yes Bonnielee Haff, MD  Cranberry Juice Powder 425 MG CAPS 1 capsule (425 mg total) by PEG Tube route 2 (two) times daily. 01/24/13  Yes Bonnielee Haff, MD  dantrolene (DANTRIUM) 50 MG capsule Take 200 mg by mouth at bedtime.    Yes Historical Provider, MD  diazepam (VALIUM) 5 MG tablet Take 5 mg by mouth at bedtime.   Yes Historical Provider, MD  fexofenadine (ALLEGRA) 180 MG tablet Place 180 mg into feeding tube daily.   Yes Historical Provider, MD  Garlic Oil (ODORLESS GARLIC) XX123456 MG TABS XX123456 mg by PEG Tube route daily.    Yes Historical Provider, MD  HYDROcodone-acetaminophen (NORCO) 7.5-325 MG per tablet Place 1 tablet into feeding tube every 6 (six) hours as needed (pain).   Yes Historical Provider, MD  Lactobacillus (ACIDOPHILUS PO) Place 1 tablet into feeding tube as directed.    Yes Historical Provider, MD  levETIRAcetam (KEPPRA) 100 MG/ML solution Place 1,000 mg into feeding tube 2 (two) times daily. 01/24/13  Yes Bonnielee Haff, MD  magnesium hydroxide (MILK OF MAGNESIA) 400 MG/5ML suspension Take 30 mLs by mouth every 12 (twelve) hours as needed for mild constipation.   Yes Historical Provider, MD  Multiple Vitamin (MULTIVITAMIN WITH MINERALS) TABS tablet Place 1 tablet into feeding tube daily. 01/24/13  Yes Bonnielee Haff, MD  NALTREXONE HCL PO Give 4.5 mg by tube at bedtime.   Yes Historical Provider, MD  neomycin-bacitracin-polymyxin (NEOSPORIN) [phone] ointment Apply 1 application topically daily as needed (for toes).   Yes Historical Provider, MD  Nutritional Supplements (FEEDING SUPPLEMENT, OSMOLITE 1.5 CAL,) LIQD Place 1,000 mLs into feeding tube continuous. 11/02/14  Yes Robbie Lis, MD  nystatin (MYCOSTATIN/NYSTOP) 100000 UNIT/GM POWD Apply 1 g topically 3 (three) times daily. Patient taking  differently: Apply 1 g topically daily. Applies to groin 01/24/13  Yes Bonnielee Haff, MD  omeprazole (PRILOSEC) 2 mg/mL SUSP Place 20 mLs (40 mg total) into feeding tube daily. 01/24/13  Yes Bonnielee Haff, MD  polyethylene glycol (MIRALAX / GLYCOLAX) packet Place 17 g into feeding tube daily. 01/24/13  Yes Bonnielee Haff, MD  polyvinyl alcohol (LIQUIFILM TEARS) 1.4 % ophthalmic solution Place 1 drop into both eyes 3 (three) times daily. Patient taking differently: Place 1 drop into both eyes 2 (two) times daily.  01/24/13  Yes Bonnielee Haff, MD  predniSONE 5 MG/ML concentrated solution Place 2 mLs (10 mg total) into feeding tube daily with breakfast. 11/02/14  Yes Robbie Lis, MD  senna (SENOKOT) 8.6 MG TABS tablet Take 2 tablets by mouth daily.   Yes Historical Provider, MD  sodium phosphate (FLEET) enema Place 1 enema rectally once as needed (for constipation). follow package directions   Yes Historical Provider, MD  Water For Irrigation, Sterile (FREE WATER) SOLN Place 240 mLs  into feeding tube every 4 (four) hours. 01/24/13  Yes Bonnielee Haff, MD  AFLURIA PRESERVATIVE FREE 0.5 ML SUSY Inject 0.5 mLs into the skin once. Reported on 12/24/2014 10/14/14   Historical Provider, MD  antiseptic oral rinse (CPC / CETYLPYRIDINIUM CHLORIDE 0.05%) 0.05 % LIQD solution 7 mLs by Mouth Rinse route 2 times daily at 12 noon and 4 pm. Patient not taking: Reported on 12/24/2014 11/02/14   Robbie Lis, MD  Diaper Rash Products (DESITIN EX) Apply 1 application topically as needed (may apply to minor skin breakdown until healed).    Historical Provider, MD  metoprolol tartrate (LOPRESSOR) 25 mg/10 mL SUSP Take 5 mLs (12.5 mg total) by mouth 2 (two) times daily. Patient not taking: Reported on 12/24/2014 11/02/14   Robbie Lis, MD  ondansetron (ZOFRAN) 4 MG tablet Place 1 tablet (4 mg total) into feeding tube every 6 (six) hours as needed for nausea. Patient not taking: Reported on 12/24/2014 11/02/14   Robbie Lis, MD  pantoprazole sodium (PROTONIX) 40 mg/20 mL PACK Place 20 mLs (40 mg total) into feeding tube daily. Patient not taking: Reported on 12/24/2014 11/02/14   Robbie Lis, MD  tiZANidine (ZANAFLEX) 2 MG tablet Take 1 tablet (2 mg total) by mouth every 6 (six) hours as needed for muscle spasms. 12/24/14   Kathrynn Ducking, MD     No Known Allergies  ROS:  Out of a complete 14 system review of symptoms, the patient complains only of the following symptoms, and all other reviewed systems are negative.  Double vision Shortness of breath, cough Bowel incontinence Feeling hot, cold Weakness, difficulty swallowing, seizures, tremor Depression Restless legs  Blood pressure 108/75, pulse 89.  Physical Exam  General: The patient is alert, but he does not clearly respond to the examiner.  Eyes: Pupils are equal, round, and reactive to light. Discs are flat bilaterally.  Neck: The neck is supple, no carotid bruits are noted.  Respiratory: The respiratory examination is clear.  Cardiovascular: The cardiovascular examination reveals a regular rate and rhythm, no obvious murmurs or rubs are noted.  Abdomen: Gastric tube is in place, positive bowel sounds, nontender.  Neuromuscular: The arms and the knees and hips are in flexion. There is severe adduction of the legs, right over left. Unable to abduct the knees with constant pressure.  Skin: Extremities are without significant edema.  Neurologic Exam  Mental status: The patient will moan at times, but is nonverbal. The patient does not appear to acknowledge the examiner, or follow any commands.  Cranial nerves: Facial symmetry is present. There is divergence of gaze. The patient has not blink to threat from either side. The patient is nonverbal.  Motor: The motor testing reveals flexion contractures of the elbows, knees, and hips. The patient does have some voluntary flexion of the right hip, otherwise no voluntary movement is  seen. The patient withdrawals only with the right leg to deep pain stimulation.  Sensory: Sensory testing is difficult, the patient withdrawals or flexes the right hip with deep pain stimulation of the foot, otherwise no response on the arms or left leg.  Coordination: Cerebellar testing cannot be performed, the patient is unable to follow commands.  Gait and station: The patient is nonambulatory, bed bound.  Reflexes: Deep tendon reflexes are symmetric, toes are neutral bilaterally.   Assessment/Plan:  1. Multiple sclerosis, end-stage  2. Dementia associated with multiple sclerosis  3. Quadriparesis, nonambulatory state  4. Neurogenic bowel and bladder  5. History of seizures  6. Flexion contractures of elbows, hips, and knees bilaterally  The patient clearly has an end-stage condition associated with multiple sclerosis. There is no indication for disease modifying agents. The wife and caretaker I believe have an unrealistic interpretation of his abilities. I believe that the patient is severely demented, and may possibly be blind. The wife and caretaker believe that the patient is able to watch TV, and is fully cognitively aware, able to carry on conversations with eye blink. I have not observed this on the clinical examination today. Appropriately, they are concerned about the inability to fully abduct the knees so they can perform day-to-day caring tasks involving bowel and bladder function. I am concerned that much of the immobility of the hips is related to a frozen joint issue, not solely secondary to spasticity. I will place the patient on tizanidine in addition to the baclofen, and get physical therapy to see the patient to see if they can work with him over time to increase mobility at the hips. I will set up a referral to Dr. Krista Blue for possible Botox therapy of the adductors of the thighs. If the hip joints are frozen, oral medications and Botox therapy may be completely  ineffective, and there certainly will be no indication for a baclofen pump placement. The patient will follow-up in 4 or 5 months. He will remain on low-dose prednisone for now, 5 mg daily. They wish to go on low-dose naltrexone, 4.5 mg daily. I have no problems with this.  Jill Alexanders MD 12/24/2014 3:34 PM  [facility] [address] [place], [place] [address]  Phone [phone] Fax [phone]

## 2014-12-27 ENCOUNTER — Telehealth: Payer: Self-pay | Admitting: Neurology

## 2014-12-27 NOTE — Telephone Encounter (Signed)
Called and left message for patient For Botox Consult with Dr. Krista Blue for Dr. Jannifer [name] . Patient can speak to [place] for scheduling.

## 2014-12-30 ENCOUNTER — Other Ambulatory Visit: Payer: Self-pay | Admitting: *Deleted

## 2014-12-30 NOTE — Patient Outreach (Signed)
[facility]) Care Management  12/30/2014  Terry Palmer 18-Jan-1975 [id number]   Telephone call to [place] message on cell phone voice mail.  Plan: will follow up.  Sherrin Daisy, RN BSN [place] Management Coordinator [facility] Care Management  [phone]

## 2014-12-31 ENCOUNTER — Encounter: Payer: Self-pay | Admitting: *Deleted

## 2014-12-31 ENCOUNTER — Other Ambulatory Visit: Payer: Self-pay | Admitting: *Deleted

## 2014-12-31 NOTE — Patient Outreach (Signed)
[facility]) Care Management  12/31/2014  MOSA HILLIER 1975-11-26 [id number]  Received incoming call from caregiver; states patient has progressive multiple sclerosis and is unable to speak so she takes all of his calls.  Caregiver/spouse advised of reason for call and of [facility] care management services. Caregiver voices that patient is currently cared for by private duty nurse and family members. States patient is total care and that care arrangements are adequate at this time and working out well. States patient is taken to MD appointments via community transportation services. States getting medications without problems. States patient receives nutrition via feeding tube. States advanced directive are in place. States private duty nurse cares for patient 12 hrs daily and when she leaves she and 2 children care for patient. Caregiver currently voices no health care concerns at this time and declined [facility] care management services.  Plan:  Close out case. Send MD closure letter. Send [facility] contact information to caregiver.  Sherrin Daisy, RN BSN [place] Management Coordinator [facility] Care Management  [phone]

## 2014-12-31 NOTE — Patient Outreach (Signed)
[facility]) Care Management  12/31/2014  TRISON BETKE 03-27-1975 [id number]   Telephone call attempt; left message on voice mail requesting call back.  Plan:  Geophysicist/field seismologist. Follow up 10 business days. Sherrin Daisy, RN BSN [place] Management Coordinator [facility] Care Management  [phone]

## 2015-03-02 ENCOUNTER — Telehealth: Payer: Self-pay | Admitting: *Deleted

## 2015-03-02 NOTE — Telephone Encounter (Signed)
Form,[place] [employer] received from [place] sent to [place] and Dr Jannifer [name] 03/02/15.

## 2015-03-03 ENCOUNTER — Inpatient Hospital Stay (HOSPITAL_COMMUNITY): Payer: Medicare HMO

## 2015-03-03 ENCOUNTER — Inpatient Hospital Stay (HOSPITAL_COMMUNITY)
Admission: EM | Admit: 2015-03-03 | Discharge: 2015-03-05 | DRG: 698 | Disposition: A | Discharge status: Home / Self Care | Payer: Medicare HMO | Attending: Internal Medicine | Admitting: Internal Medicine

## 2015-03-03 ENCOUNTER — Encounter (HOSPITAL_COMMUNITY): Payer: Self-pay | Admitting: Emergency Medicine

## 2015-03-03 ENCOUNTER — Emergency Department (HOSPITAL_COMMUNITY): Payer: Medicare HMO

## 2015-03-03 DIAGNOSIS — Y846 Urinary catheterization as the cause of abnormal reaction of the patient, or of later complication, without mention of misadventure at the time of the procedure: Secondary | ICD-10-CM | POA: Diagnosis present

## 2015-03-03 DIAGNOSIS — Z87891 Personal history of nicotine dependence: Secondary | ICD-10-CM | POA: Diagnosis not present

## 2015-03-03 DIAGNOSIS — Z87440 Personal history of urinary (tract) infections: Secondary | ICD-10-CM

## 2015-03-03 DIAGNOSIS — G40909 Epilepsy, unspecified, not intractable, without status epilepticus: Secondary | ICD-10-CM

## 2015-03-03 DIAGNOSIS — M6281 Muscle weakness (generalized): Secondary | ICD-10-CM | POA: Diagnosis present

## 2015-03-03 DIAGNOSIS — T380X5A Adverse effect of glucocorticoids and synthetic analogues, initial encounter: Secondary | ICD-10-CM | POA: Diagnosis present

## 2015-03-03 DIAGNOSIS — R509 Fever, unspecified: Secondary | ICD-10-CM

## 2015-03-03 DIAGNOSIS — R140 Abdominal distension (gaseous): Secondary | ICD-10-CM | POA: Diagnosis present

## 2015-03-03 DIAGNOSIS — Z79899 Other long term (current) drug therapy: Secondary | ICD-10-CM

## 2015-03-03 DIAGNOSIS — G9340 Encephalopathy, unspecified: Secondary | ICD-10-CM | POA: Diagnosis present

## 2015-03-03 DIAGNOSIS — T83511A Infection and inflammatory reaction due to indwelling urethral catheter, initial encounter: Principal | ICD-10-CM | POA: Diagnosis present

## 2015-03-03 DIAGNOSIS — N390 Urinary tract infection, site not specified: Secondary | ICD-10-CM | POA: Diagnosis present

## 2015-03-03 DIAGNOSIS — Z7401 Bed confinement status: Secondary | ICD-10-CM | POA: Diagnosis not present

## 2015-03-03 DIAGNOSIS — F329 Major depressive disorder, single episode, unspecified: Secondary | ICD-10-CM | POA: Diagnosis present

## 2015-03-03 DIAGNOSIS — N319 Neuromuscular dysfunction of bladder, unspecified: Secondary | ICD-10-CM | POA: Diagnosis present

## 2015-03-03 DIAGNOSIS — Z7952 Long term (current) use of systemic steroids: Secondary | ICD-10-CM | POA: Diagnosis not present

## 2015-03-03 DIAGNOSIS — F0280 Dementia in other diseases classified elsewhere without behavioral disturbance: Secondary | ICD-10-CM | POA: Diagnosis present

## 2015-03-03 DIAGNOSIS — K6389 Other specified diseases of intestine: Secondary | ICD-10-CM

## 2015-03-03 DIAGNOSIS — G8250 Quadriplegia, unspecified: Secondary | ICD-10-CM | POA: Diagnosis present

## 2015-03-03 DIAGNOSIS — Z0289 Encounter for other administrative examinations: Secondary | ICD-10-CM

## 2015-03-03 DIAGNOSIS — Z825 Family history of asthma and other chronic lower respiratory diseases: Secondary | ICD-10-CM

## 2015-03-03 DIAGNOSIS — R319 Hematuria, unspecified: Secondary | ICD-10-CM | POA: Diagnosis present

## 2015-03-03 DIAGNOSIS — T83511D Infection and inflammatory reaction due to indwelling urethral catheter, subsequent encounter: Secondary | ICD-10-CM | POA: Diagnosis not present

## 2015-03-03 DIAGNOSIS — G35 Multiple sclerosis: Secondary | ICD-10-CM | POA: Diagnosis present

## 2015-03-03 DIAGNOSIS — Z931 Gastrostomy status: Secondary | ICD-10-CM | POA: Diagnosis not present

## 2015-03-03 LAB — COMPREHENSIVE METABOLIC PANEL
ALBUMIN: 3.9 g/dL (ref 3.5–5.0)
ALT: 19 U/L (ref 17–63)
ANION GAP: 7 (ref 5–15)
AST: 16 U/L (ref 15–41)
Alkaline Phosphatase: 82 U/L (ref 38–126)
BILIRUBIN TOTAL: 0.6 mg/dL (ref 0.3–1.2)
BUN: 12 mg/dL (ref 6–20)
CALCIUM: 8.7 mg/dL — AB (ref 8.9–10.3)
CO2: 25 mmol/L (ref 22–32)
Chloride: 102 mmol/L (ref 101–111)
Creatinine, Ser: 0.56 mg/dL — ABNORMAL LOW (ref 0.61–1.24)
GFR calc Af Amer: >60 mL/min (ref >60)
Glucose, Bld: 90 mg/dL (ref 65–99)
POTASSIUM: 3.9 mmol/L (ref 3.5–5.1)
Sodium: 134 mmol/L — ABNORMAL LOW (ref 135–145)
Total Protein: 7 g/dL (ref 6.5–8.1)

## 2015-03-03 LAB — URINALYSIS, ROUTINE W REFLEX MICROSCOPIC
Bilirubin Urine: NEGATIVE
GLUCOSE, UA: NEGATIVE mg/dL
Ketones, ur: NEGATIVE mg/dL
Nitrite: POSITIVE — AB
PROTEIN: 100 mg/dL — AB
SPECIFIC GRAVITY, URINE: 1.027 (ref 1.005–1.030)
pH: 8.5 — ABNORMAL HIGH (ref 5.0–8.0)

## 2015-03-03 LAB — CBC WITH DIFFERENTIAL/PLATELET
BASOS PCT: 0 %
Basophils Absolute: 0 10*3/uL (ref 0.0–0.1)
EOS PCT: 1 %
Eosinophils Absolute: 0.2 10*3/uL (ref 0.0–0.7)
HEMATOCRIT: 38.2 % — AB (ref 39.0–52.0)
Hemoglobin: 12.4 g/dL — ABNORMAL LOW (ref 13.0–17.0)
Lymphocytes Relative: 8 %
Lymphs Abs: 1.4 10*3/uL (ref 0.7–4.0)
MCH: 31.1 pg (ref 26.0–34.0)
MCHC: 32.5 g/dL (ref 30.0–36.0)
MCV: 95.7 fL (ref 78.0–100.0)
MONO ABS: 1.6 10*3/uL — AB (ref 0.1–1.0)
MONOS PCT: 9 %
NEUTROS ABS: 14.7 10*3/uL — AB (ref 1.7–7.7)
Neutrophils Relative %: 82 %
PLATELETS: 260 10*3/uL (ref 150–400)
RBC: 3.99 MIL/uL — ABNORMAL LOW (ref 4.22–5.81)
RDW: 13.2 % (ref 11.5–15.5)
WBC: 18 10*3/uL — ABNORMAL HIGH (ref 4.0–10.5)

## 2015-03-03 LAB — URINE MICROSCOPIC-ADD ON

## 2015-03-03 LAB — TROPONIN I: Troponin I: <0.03 ng/mL (ref <0.031)

## 2015-03-03 LAB — I-STAT CG4 LACTIC ACID, ED: Lactic Acid, Venous: 0.79 mmol/L (ref 0.5–2.0)

## 2015-03-03 MED ORDER — DANTROLENE SODIUM 100 MG PO CAPS
200.0000 mg | ORAL_CAPSULE | Freq: Every day | ORAL | Status: DC
  Administered 2015-03-03 – 2015-03-04 (×2): 200 mg via ORAL
  Filled 2015-03-03 (×3): qty 2

## 2015-03-03 MED ORDER — SODIUM CHLORIDE 0.9 % IV BOLUS (SEPSIS)
1000.0000 mL | Freq: Once | INTRAVENOUS | Status: AC
  Administered 2015-03-03: 1000 mL via INTRAVENOUS

## 2015-03-03 MED ORDER — ONDANSETRON HCL 4 MG/2ML IJ SOLN: 4.0000 mg | Freq: Four times a day (QID) | INTRAMUSCULAR | Status: DC | PRN

## 2015-03-03 MED ORDER — IOHEXOL 300 MG/ML  SOLN
100.0000 mL | Freq: Once | INTRAMUSCULAR | Status: AC | PRN
  Administered 2015-03-03: 100 mL via INTRAVENOUS

## 2015-03-03 MED ORDER — ALBUTEROL SULFATE (2.5 MG/3ML) 0.083% IN NEBU: 2.5000 mg | INHALATION_SOLUTION | Freq: Four times a day (QID) | RESPIRATORY_TRACT | Status: DC | PRN

## 2015-03-03 MED ORDER — ACETAMINOPHEN 650 MG RE SUPP: 650.0000 mg | Freq: Four times a day (QID) | RECTAL | Status: DC | PRN

## 2015-03-03 MED ORDER — ACETAMINOPHEN 325 MG PO TABS
650.0000 mg | ORAL_TABLET | Freq: Four times a day (QID) | ORAL | Status: DC | PRN
Start: 2015-03-03 — End: 2015-03-05

## 2015-03-03 MED ORDER — IOHEXOL 300 MG/ML  SOLN
50.0000 mL | Freq: Once | INTRAMUSCULAR | Status: AC | PRN
  Administered 2015-03-03: 50 mL via ORAL

## 2015-03-03 MED ORDER — DEXTROSE 5 % IV SOLN
1.0000 g | INTRAVENOUS | Status: DC
  Administered 2015-03-04: 1 g via INTRAVENOUS
  Filled 2015-03-03: qty 10

## 2015-03-03 MED ORDER — POLYVINYL ALCOHOL 1.4 % OP SOLN
1.0000 [drp] | Freq: Three times a day (TID) | OPHTHALMIC | Status: DC
  Administered 2015-03-03 – 2015-03-05 (×5): 1 [drp] via OPHTHALMIC
  Filled 2015-03-03: qty 15

## 2015-03-03 MED ORDER — ENOXAPARIN SODIUM 40 MG/0.4ML ~~LOC~~ SOLN
40.0000 mg | SUBCUTANEOUS | Status: DC
  Administered 2015-03-03 – 2015-03-04 (×2): 40 mg via SUBCUTANEOUS
  Filled 2015-03-03 (×2): qty 0.4

## 2015-03-03 MED ORDER — TIZANIDINE HCL 2 MG PO TABS
2.0000 mg | ORAL_TABLET | Freq: Four times a day (QID) | ORAL | Status: DC | PRN
  Administered 2015-03-04: 2 mg via ORAL
  Filled 2015-03-03 (×2): qty 1

## 2015-03-03 MED ORDER — ACETAMINOPHEN 500 MG PO TABS
1000.0000 mg | ORAL_TABLET | Freq: Two times a day (BID) | ORAL | Status: DC
  Administered 2015-03-03 – 2015-03-05 (×4): 1000 mg
  Filled 2015-03-03 (×4): qty 2

## 2015-03-03 MED ORDER — IPRATROPIUM-ALBUTEROL 0.5-2.5 (3) MG/3ML IN SOLN
3.0000 mL | Freq: Two times a day (BID) | RESPIRATORY_TRACT | Status: DC
Start: 2015-03-03 — End: 2015-03-05
  Administered 2015-03-03 – 2015-03-04 (×3): 3 mL via RESPIRATORY_TRACT
  Filled 2015-03-03 (×3): qty 3

## 2015-03-03 MED ORDER — BACLOFEN 10 MG PO TABS
20.0000 mg | ORAL_TABLET | Freq: Four times a day (QID) | ORAL | Status: DC
  Administered 2015-03-03 – 2015-03-05 (×7): 20 mg
  Filled 2015-03-03 (×7): qty 2

## 2015-03-03 MED ORDER — DEXTROSE 5 % IV SOLN
1.0000 g | Freq: Once | INTRAVENOUS | Status: AC
  Administered 2015-03-03: 1 g via INTRAVENOUS
  Filled 2015-03-03: qty 10

## 2015-03-03 MED ORDER — ONDANSETRON HCL 4 MG PO TABS: 4.0000 mg | ORAL_TABLET | Freq: Four times a day (QID) | ORAL | Status: DC | PRN

## 2015-03-03 MED ORDER — CHLORHEXIDINE GLUCONATE 0.12 % MT SOLN
15.0000 mL | Freq: Two times a day (BID) | OROMUCOSAL | Status: DC
  Administered 2015-03-03 – 2015-03-05 (×4): 15 mL via OROMUCOSAL
  Filled 2015-03-03 (×4): qty 15

## 2015-03-03 MED ORDER — DIAZEPAM 5 MG PO TABS: 5.0000 mg | ORAL_TABLET | Freq: Every day | ORAL | Status: DC | PRN

## 2015-03-03 MED ORDER — BUPROPION HCL 75 MG PO TABS
75.0000 mg | ORAL_TABLET | Freq: Three times a day (TID) | ORAL | Status: DC
  Administered 2015-03-03 – 2015-03-05 (×5): 75 mg
  Filled 2015-03-03 (×7): qty 1

## 2015-03-03 MED ORDER — CETYLPYRIDINIUM CHLORIDE 0.05 % MT LIQD
7.0000 mL | Freq: Two times a day (BID) | OROMUCOSAL | Status: DC
  Administered 2015-03-04 – 2015-03-05 (×3): 7 mL via OROMUCOSAL

## 2015-03-03 MED ORDER — FREE WATER
240.0000 mL | Status: DC
  Administered 2015-03-03 – 2015-03-05 (×11): 240 mL

## 2015-03-03 MED ORDER — NYSTATIN 100000 UNIT/GM EX POWD: 1.0000 g | CUTANEOUS | Status: DC | PRN

## 2015-03-03 MED ORDER — PREDNISONE 5 MG/ML PO CONC
25.0000 mg | Freq: Every day | ORAL | Status: DC
  Administered 2015-03-04 – 2015-03-05 (×2): 25 mg
  Filled 2015-03-03 (×3): qty 5

## 2015-03-03 MED ORDER — LEVETIRACETAM 500 MG PO TABS
1000.0000 mg | ORAL_TABLET | Freq: Two times a day (BID) | ORAL | Status: DC
  Administered 2015-03-03 – 2015-03-05 (×4): 1000 mg via ORAL
  Filled 2015-03-03 (×4): qty 2

## 2015-03-03 MED ORDER — SODIUM CHLORIDE 0.9 % IV SOLN
INTRAVENOUS | Status: AC
  Administered 2015-03-03 – 2015-03-04 (×2): via INTRAVENOUS

## 2015-03-03 NOTE — ED Notes (Signed)
Per [facility] pt from home , fever today and passing clots in foley catheter starting yesterday. Per ems per family pt's loc has decreased today . Hx AMS, presents with G tube and foley catheter. Family denies nausea nor diarrhea.

## 2015-03-03 NOTE — Telephone Encounter (Signed)
Form,[place] [employer] received completed by Dr Jannifer [name] and Charisse Klinefelter at front desk for patient 03/03/15.

## 2015-03-03 NOTE — H&P (Addendum)
[facility] History and Physical  Terry Palmer JOI:[id number] DOB: January 14, 1975 DOA: 03/03/2015   PCP: [name], MD  Specialists: Unknown  Chief Complaint: Fever  HPI: Terry Palmer is a 40 y.o. male with a past medical history of multiple sclerosis as a result of which he is bedbound. He is quadriparetic. He has a chronic indwelling Foley catheter. He has around-the-clock caregivers at home. Patient is unable to provide any history. Most of the information was provided by his mother, wife and caregiver. Apparently this morning, patient was noted to be lethargic. His urine was dark in color with some blood clots. They checked his temperature and noted that it was 100.9. So he was brought into the hospital for further management. History is very limited as the patient is not communicative. His wife also mentioned some abdominal distention recently.  In the emergency department, patient was given IV fluids. He was found to have a UTI. With the IV fluidspatient started responding better. He will need hospitalization for further management.  Home Medications: Prior to Admission medications   Medication Sig Start Date End Date Taking? Authorizing Provider  acetaminophen (TYLENOL) 500 MG tablet Place 1,000 mg into feeding tube 2 (two) times daily.    Yes Historical Provider, MD  acetaminophen (TYLENOL) 500 MG tablet Take 500 mg by mouth every 4 (four) hours as needed for fever (or pain).   Yes Historical Provider, MD  albuterol (PROVENTIL) (2.5 MG/3ML) 0.083% nebulizer solution Take 2.5 mg by nebulization every 6 (six) hours as needed for wheezing or shortness of breath.   Yes Historical Provider, MD  baclofen (LIORESAL) 20 MG tablet Place 1 tablet (20 mg total) into feeding tube 3 (three) times daily. Patient taking differently: Place 20 mg into feeding tube 4 (four) times daily.  01/24/13  Yes Bonnielee Haff, MD  bisacodyl (DULCOLAX) 10 MG suppository Place 10 mg rectally once as  needed for moderate constipation.    Yes Historical Provider, MD  buPROPion (WELLBUTRIN) 75 MG tablet Place 1 tablet (75 mg total) into feeding tube 3 (three) times daily. 01/24/13  Yes Bonnielee Haff, MD  cholecalciferol (VITAMIN D) 1000 UNITS tablet Take 1 tablet (1,000 Units total) by mouth 2 (two) times daily. Patient taking differently: Take 5,000 Units by mouth daily.  01/24/13  Yes Bonnielee Haff, MD  Cranberry Juice Powder 425 MG CAPS 1 capsule (425 mg total) by PEG Tube route 2 (two) times daily. 01/24/13  Yes Bonnielee Haff, MD  dantrolene (DANTRIUM) 50 MG capsule Take 200 mg by mouth at bedtime.    Yes Historical Provider, MD  Diaper Rash Products (DESITIN EX) Apply 1 application topically as needed (may apply to minor skin breakdown until healed).   Yes Historical Provider, MD  diazepam (VALIUM) 5 MG tablet Take 5 mg by mouth daily as needed for anxiety.    Yes Historical Provider, MD  fexofenadine (ALLEGRA) 180 MG tablet Place 180 mg into feeding tube daily.   Yes Historical Provider, MD  Garlic Oil 4709 MG CAPS Take 1,000 mg by mouth daily with breakfast.   Yes Historical Provider, MD  guaiFENesin-dextromethorphan (ROBITUSSIN DM) 100-10 MG/5ML syrup Take 10 mLs by mouth every 4 (four) hours as needed for cough.   Yes Historical Provider, MD  HYDROcodone-acetaminophen (NORCO) 7.5-325 MG per tablet Place 1 tablet into feeding tube daily as needed (pain).    Yes Historical Provider, MD  ipratropium-albuterol (DUONEB) 0.5-2.5 (3) MG/3ML SOLN Take 3 mLs by nebulization 2 (two) times daily.  Yes Historical Provider, MD  Lactobacillus (ACIDOPHILUS PO) Place 1 capsule into feeding tube daily.    Yes Historical Provider, MD  levETIRAcetam (KEPPRA) 500 MG tablet Take 1,000 mg by mouth 2 (two) times daily.   Yes Historical Provider, MD  magnesium hydroxide (MILK OF MAGNESIA) 400 MG/5ML suspension Take 30 mLs by mouth every 12 (twelve) hours as needed for mild constipation.   Yes Historical Provider,  MD  Multiple Vitamin (MULTIVITAMIN WITH MINERALS) TABS tablet Place 1 tablet into feeding tube daily. 01/24/13  Yes Bonnielee Haff, MD  NALTREXONE HCL PO Give 4.5 mg by tube at bedtime.   Yes Historical Provider, MD  neomycin-bacitracin-polymyxin (NEOSPORIN) [phone] ointment Apply 1 application topically daily as needed (for toes).   Yes Historical Provider, MD  Nutritional Supplements (FEEDING SUPPLEMENT, OSMOLITE 1.5 CAL,) LIQD Place 1,000 mLs into feeding tube continuous. Patient taking differently: Place 75 mL/hr into feeding tube See admin instructions. For 14 hours a day 11/02/14  Yes Robbie Lis, MD  nystatin (MYCOSTATIN/NYSTOP) 100000 UNIT/GM POWD Apply 1 g topically 3 (three) times daily. Patient taking differently: Apply 1 g topically daily. Applies to groin 01/24/13  Yes Bonnielee Haff, MD  nystatin (MYCOSTATIN/NYSTOP) 100000 UNIT/GM POWD Apply 1 g topically as needed (for groin yeast).   Yes Historical Provider, MD  omeprazole (PRILOSEC) 2 mg/mL SUSP Place 20 mLs (40 mg total) into feeding tube daily. 01/24/13  Yes Bonnielee Haff, MD  polyethylene glycol (MIRALAX / GLYCOLAX) packet Place 17 g into feeding tube daily. 01/24/13  Yes Bonnielee Haff, MD  polyvinyl alcohol (LIQUIFILM TEARS) 1.4 % ophthalmic solution Place 1 drop into both eyes 3 (three) times daily. Patient taking differently: Place 1 drop into both eyes 2 (two) times daily.  01/24/13  Yes Bonnielee Haff, MD  predniSONE 5 MG/ML concentrated solution Place 2 mLs (10 mg total) into feeding tube daily with breakfast. Patient taking differently: Place 25 mg into feeding tube daily with breakfast.  11/02/14  Yes Robbie Lis, MD  Saline (SIMPLY SALINE) 0.9 % AERS Place 1 spray into both nostrils as needed (for nasal conjestion).   Yes Historical Provider, MD  senna (SENOKOT) 8.6 MG TABS tablet Take 2 tablets by mouth daily.   Yes Historical Provider, MD  sodium phosphate (FLEET) enema Place 1 enema rectally once as needed (for  constipation). follow package directions   Yes Historical Provider, MD  tiZANidine (ZANAFLEX) 2 MG tablet Take 1 tablet (2 mg total) by mouth every 6 (six) hours as needed for muscle spasms. 12/24/14  Yes Kathrynn Ducking, MD  vitamin C (ASCORBIC ACID) 500 MG tablet Place 500 mg into feeding tube 2 (two) times daily.   Yes Historical Provider, MD  Water For Irrigation, Sterile (FREE WATER) SOLN Place 240 mLs into feeding tube every 4 (four) hours. 01/24/13  Yes Bonnielee Haff, MD  antiseptic oral rinse (CPC / CETYLPYRIDINIUM CHLORIDE 0.05%) 0.05 % LIQD solution 7 mLs by Mouth Rinse route 2 times daily at 12 noon and 4 pm. Patient not taking: Reported on 12/24/2014 11/02/14   Robbie Lis, MD  ascorbic acid (VITAMIN C) 500 MG/5ML syrup Place 5 mLs (500 mg total) into feeding tube 2 (two) times daily. Patient not taking: Reported on 03/03/2015 01/24/13   Bonnielee Haff, MD  metoprolol tartrate (LOPRESSOR) 25 mg/10 mL SUSP Take 5 mLs (12.5 mg total) by mouth 2 (two) times daily. Patient not taking: Reported on 12/24/2014 11/02/14   Robbie Lis, MD  ondansetron (ZOFRAN) 4 MG tablet  Place 1 tablet (4 mg total) into feeding tube every 6 (six) hours as needed for nausea. Patient not taking: Reported on 12/24/2014 11/02/14   Robbie Lis, MD  pantoprazole sodium (PROTONIX) 40 mg/20 mL PACK Place 20 mLs (40 mg total) into feeding tube daily. Patient not taking: Reported on 12/24/2014 11/02/14   Robbie Lis, MD    Allergies: No Known Allergies  Past Medical History: Past Medical History  Diagnosis Date  . MS (multiple sclerosis) ([place])   . Quadriparesis (muscle weakness) ([place]) 03/12/2011  . Childhood asthma   . Depression   . Neuromuscular disorder ([place])     Quadraperesis  . Recurrent UTI   . Dysphagia   . Bladder calculi   . Neurogenic bladder   . Shortness of breath   . Recurrent upper respiratory infection (URI)   . Normocytic anemia 05/28/2011  . Aspiration pneumonia ([employer]) 01/20/2013  .  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 Quadriplegia and quadriparesis ([place]) 12/24/2014  . 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 Lumbar puncture  10/12/2002  . Gastrostomy  04/16/2011    Procedure: GASTROSTOMY;  Surgeon: Zenovia Jarred, MD;  Location: [facility];  Service: General;  Laterality: N/A;  Open G-Tube placement    Social History: Patient lives at home with family. He has around-the-clock caregivers. He is bedbound.  Social History   Social History  . Marital Status: Married    Spouse Name: N/A  . Number of Children: 2  . Years of Education: N/A   Occupational History  . Not on file.   Social History Main Topics  . Smoking status: Former Smoker    Types: Cigarettes    Quit date: 02/02/1999  . Smokeless tobacco: Former Systems developer  . Alcohol Use: No  . Drug Use: Not on file     Comment: Former use  . Sexual Activity: No   Other Topics Concern  . Not on file   Social History Narrative   The patient currently lives at home with his wife and kids, who help him with his ADL's.  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  The patient currently has [facility] and is in the process of reapplying for Medicaid (12/2010).    Gilford Lardizabal (cell) [phone]; (home) [phone],     Aws Shere (cell) [phone] (home) [phone]    Family History:  Family History  Problem Relation Age of Onset  . Asthma Mother      Review of Systems - unable to do due to his chronic medical condition  Physical Examination  Filed Vitals:   03/03/15 1530 03/03/15 1600 03/03/15 1625 03/03/15 1630  BP: 114/80 116/78 113/86 115/77  Pulse: 84 87 91 85  Temp:      TempSrc:      Resp: 15 15 17 17   SpO2: 99% 98% 100% 98%    BP 115/77 mmHg  Pulse 85  Temp(Src) 98.5 F (36.9 C) (Rectal)  Resp 17  SpO2 98%  General appearance:  Patient is awake with eyes open. Does not communicate. Head: Normocephalic, without obvious abnormality, atraumatic Eyes: conjunctivae/corneas clear. PERRL Throat: Slightly dry mucous membranes Neck: no adenopathy, no carotid bruit, no JVD, supple, symmetrical, trachea midline and thyroid not enlarged, symmetric, no tenderness/mass/nodules Resp: Diminished air entry at the bases without any crackles or wheezing Cardio: regular rate and rhythm, S1, S2 normal,  no murmur, click, rub or gallop GI: Abdomen is noted to be distended. Nontender. Bowel sounds are present. No masses or organomegaly appreciated. Extremities: extremities normal, atraumatic, no cyanosis or edema Pulses: 2+ and symmetric Skin: Skin color, texture, turgor normal. No rashes or lesions Lymph nodes: Cervical, supraclavicular, and axillary nodes normal. Neurologic: Awake. He is quadriplegic. He has contracted upper and lower extremities.  Laboratory Data: Results for orders placed or performed during the hospital encounter of 03/03/15 (from the past 48 hour(s))  CBC with Differential/Platelet     Status: Abnormal   Collection Time: 03/03/15 10:49 AM  Result Value Ref Range   WBC 18.0 (H) 4.0 - 10.5 K/uL   RBC 3.99 (L) 4.22 - 5.81 MIL/uL   Hemoglobin 12.4 (L) 13.0 - 17.0 g/dL   HCT 38.2 (L) 39.0 - 52.0 %   MCV 95.7 78.0 - 100.0 fL   MCH 31.1 26.0 - 34.0 pg   MCHC 32.5 30.0 - 36.0 g/dL   RDW 13.2 11.5 - 15.5 %   Platelets 260 150 - 400 K/uL   Neutrophils Relative % 82 %   Neutro Abs 14.7 (H) 1.7 - 7.7 K/uL   Lymphocytes Relative 8 %   Lymphs Abs 1.4 0.7 - 4.0 K/uL   Monocytes Relative 9 %   Monocytes Absolute 1.6 (H) 0.1 - 1.0 K/uL   Eosinophils Relative 1 %   Eosinophils Absolute 0.2 0.0 - 0.7 K/uL   Basophils Relative 0 %   Basophils Absolute 0.0 0.0 - 0.1 K/uL  Comprehensive metabolic panel     Status: Abnormal   Collection Time: 03/03/15 10:49 AM  Result Value Ref Range   Sodium 134 (L) 135 - 145 mmol/L    Potassium 3.9 3.5 - 5.1 mmol/L   Chloride 102 101 - 111 mmol/L   CO2 25 22 - 32 mmol/L   Glucose, Bld 90 65 - 99 mg/dL   BUN 12 6 - 20 mg/dL   Creatinine, Ser 0.56 (L) 0.61 - 1.24 mg/dL   Calcium 8.7 (L) 8.9 - 10.3 mg/dL   Total Protein 7.0 6.5 - 8.1 g/dL   Albumin 3.9 3.5 - 5.0 g/dL   AST 16 15 - 41 U/L   ALT 19 17 - 63 U/L   Alkaline Phosphatase 82 38 - 126 U/L   Total Bilirubin 0.6 0.3 - 1.2 mg/dL   GFR calc non Af Amer >60 >60 mL/min   GFR calc Af Amer >60 >60 mL/min    Comment: (NOTE) The eGFR has been calculated using the CKD EPI equation. This calculation has not been validated in all clinical situations. eGFR's persistently <60 mL/min signify possible Chronic Kidney Disease.    Anion gap 7 5 - 15  Troponin I     Status: None   Collection Time: 03/03/15 10:49 AM  Result Value Ref Range   Troponin I <0.03 <0.031 ng/mL    Comment:        NO INDICATION OF MYOCARDIAL INJURY.   I-Stat CG4 Lactic Acid, ED     Status: None   Collection Time: 03/03/15 10:57 AM  Result Value Ref Range   Lactic Acid, Venous 0.79 0.5 - 2.0 mmol/L  Urinalysis, Routine w reflex microscopic (not at [facility])     Status: Abnormal   Collection Time: 03/03/15 11:10 AM  Result Value Ref Range   Color, Urine AMBER (A) YELLOW    Comment: BIOCHEMICALS MAY BE AFFECTED BY COLOR   APPearance TURBID (A) CLEAR   Specific Gravity, Urine  1.027 1.005 - 1.030   pH 8.5 (H) 5.0 - 8.0   Glucose, UA NEGATIVE NEGATIVE mg/dL   Hgb urine dipstick LARGE (A) NEGATIVE   Bilirubin Urine NEGATIVE NEGATIVE   Ketones, ur NEGATIVE NEGATIVE mg/dL   Protein, ur 100 (A) NEGATIVE mg/dL   Nitrite POSITIVE (A) NEGATIVE   Leukocytes, UA LARGE (A) NEGATIVE  Urine microscopic-add on     Status: Abnormal   Collection Time: 03/03/15 11:10 AM  Result Value Ref Range   Squamous Epithelial / LPF 6-30 (A) NONE SEEN   WBC, UA TOO NUMEROUS TO COUNT 0 - 5 WBC/hpf   RBC / HPF TOO NUMEROUS TO COUNT 0 - 5 RBC/hpf   Bacteria, UA MANY (A)  NONE SEEN    Radiology Reports: Dg Chest Port 1 View  03/03/2015  CLINICAL DATA:  Shortness of breath, asthma, former smoker, multiple sclerosis, quadriplegia and quadraparesis EXAM: PORTABLE CHEST 1 VIEW COMPARISON:  Portable exam 1033 hours compared to 10/28/2014 FINDINGS: Low lung volumes. Elevation of RIGHT diaphragm with colon interposition between liver and diaphragm. Gaseous distention of colon throughout upper abdomen up to 11.5 cm diameter at hepatic flexure. Normal heart size. Bibasilar atelectasis. Upper lungs clear. Minimal central peribronchial thickening. No definite infiltrate, pleural effusion or pneumothorax. IMPRESSION: Decreased lung volumes with bronchitic changes and bibasilar atelectasis. Gaseous distention of colon up to 11.5 cm diameter, little changed versus 10/28/2014 exam when colon measured 10.9 cm diameter at same site. Electronically Signed   By: Lavonia Dana M.D.   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 He was last hospitalized in October with acute respiratory failure and aspiration. He was in the intensive care unit and was intubated. Patient presents today with fever, lethargy and appears to have a urinary tract infection. He has a chronic indwelling Foley, which was last changed this past Monday.  Plan: #1 Urinary tract infection in the setting of chronic indwelling suprapubic Foley catheter: His Foley was changed on Monday. This could've precipitated the onset of infection. We will replace Foley catheter. Previous culture data were reviewed. He'll be placed on  ceftriaxone. Urine has started clearing up with IV fluids. ContinueIVF for now. Await culture data. Lactic acid is normal.  #2 acute encephalopathy: Most likely secondary to UTI. Mental status is already improving per family. Continue to monitor for now.  #3 Abdominal distention: Patient was noted to have distention of his abdomen on examination. He does have bowel sounds. Chest x-ray did mention colonic distention seen previously in October. Previous imaging studies reviewed. No CT of the abdomen Pelvis is available recently. Colonic distention was not present prior to October 2016. Proceed with CT scan of the abdomen pelvis for now.  #4 nutrition: Patient does have a PEG tube and he gets tube feedings with Osmolite 1.5. We will be able to resume feeds once the CT scan results are available and if nothing concerning is noted in the abdomen.  #5 History of multiple sclerosis with quadriplegia and dementia: Stable. Continue with his home medications including prednisone.  #6 history of seizure disorder: Continue with Keppra.  #7  Leukocytosis: Previous blood work were reviewed. He has had chronic elevation in his WBC count. He is noted to be on chronic steroids which could be the reason.   DVT Prophylaxis: Lovenox Code Status: Full code Family Communication: Discussed with the patient's wife and mother  Disposition Plan: Admit to MedSurg   Further management decisions will depend on results of further testing and patient's response to treatment.   [facility]  [facility] Pager [phone]  If 7PM-7AM, please contact night-coverage [email or web address] Password [facility]  03/03/2015, 4:49 PM

## 2015-03-03 NOTE — ED Notes (Signed)
RN to pull off of line for blood

## 2015-03-03 NOTE — Progress Notes (Signed)
EDCM went to speak to patient's family at bedside.  No family present.  Patient to be admitted.

## 2015-03-03 NOTE — ED Notes (Signed)
Bed: WA15 Expected date:  Expected time:  Means of arrival:  Comments: [facility] 

## 2015-03-03 NOTE — ED Notes (Signed)
Attempted to call report, RN unavailable.

## 2015-03-04 DIAGNOSIS — G8250 Quadriplegia, unspecified: Secondary | ICD-10-CM

## 2015-03-04 DIAGNOSIS — R509 Fever, unspecified: Secondary | ICD-10-CM

## 2015-03-04 DIAGNOSIS — G35 Multiple sclerosis: Secondary | ICD-10-CM

## 2015-03-04 DIAGNOSIS — G40909 Epilepsy, unspecified, not intractable, without status epilepticus: Secondary | ICD-10-CM

## 2015-03-04 LAB — COMPREHENSIVE METABOLIC PANEL
ALBUMIN: 3.6 g/dL (ref 3.5–5.0)
ALT: 16 U/L — AB (ref 17–63)
AST: 16 U/L (ref 15–41)
Alkaline Phosphatase: 74 U/L (ref 38–126)
Anion gap: 9 (ref 5–15)
BILIRUBIN TOTAL: 0.5 mg/dL (ref 0.3–1.2)
BUN: 7 mg/dL (ref 6–20)
CHLORIDE: 105 mmol/L (ref 101–111)
CO2: 24 mmol/L (ref 22–32)
CREATININE: 0.66 mg/dL (ref 0.61–1.24)
Calcium: 8.8 mg/dL — ABNORMAL LOW (ref 8.9–10.3)
GFR calc Af Amer: >60 mL/min (ref >60)
GLUCOSE: 76 mg/dL (ref 65–99)
POTASSIUM: 4.1 mmol/L (ref 3.5–5.1)
Sodium: 138 mmol/L (ref 135–145)
Total Protein: 6.7 g/dL (ref 6.5–8.1)

## 2015-03-04 LAB — MRSA PCR SCREENING: MRSA by PCR: NEGATIVE

## 2015-03-04 LAB — CBC
HEMATOCRIT: 36.7 % — AB (ref 39.0–52.0)
Hemoglobin: 12 g/dL — ABNORMAL LOW (ref 13.0–17.0)
MCH: 31.1 pg (ref 26.0–34.0)
MCHC: 32.7 g/dL (ref 30.0–36.0)
MCV: 95.1 fL (ref 78.0–100.0)
PLATELETS: 212 10*3/uL (ref 150–400)
RBC: 3.86 MIL/uL — ABNORMAL LOW (ref 4.22–5.81)
RDW: 13 % (ref 11.5–15.5)
WBC: 18.6 10*3/uL — AB (ref 4.0–10.5)

## 2015-03-04 LAB — URINE CULTURE: SPECIAL REQUESTS: NORMAL

## 2015-03-04 MED ORDER — CEFUROXIME AXETIL 250 MG/5ML PO SUSR
250.0000 mg | Freq: Two times a day (BID) | ORAL | Status: DC
  Administered 2015-03-05: 250 mg
  Filled 2015-03-04 (×2): qty 5

## 2015-03-04 MED ORDER — CIPROFLOXACIN 500 MG/5ML (10%) PO SUSR: 500.0000 mg | Freq: Two times a day (BID) | ORAL | Status: DC

## 2015-03-04 MED ORDER — JEVITY 1.2 CAL PO LIQD: 1000.0000 mL | ORAL | Status: DC

## 2015-03-04 MED ORDER — POLYETHYLENE GLYCOL 3350 17 G PO PACK
17.0000 g | PACK | Freq: Every day | ORAL | Status: DC
  Administered 2015-03-04 – 2015-03-05 (×2): 17 g
  Filled 2015-03-04 (×2): qty 1

## 2015-03-04 MED ORDER — GUAIFENESIN-DM 100-10 MG/5ML PO SYRP
10.0000 mL | ORAL_SOLUTION | ORAL | Status: DC | PRN
  Administered 2015-03-04: 10 mL via ORAL
  Filled 2015-03-04: qty 10

## 2015-03-04 MED ORDER — JEVITY 1.5 CAL/FIBER PO LIQD
1000.0000 mL | ORAL | Status: DC
  Administered 2015-03-04 – 2015-03-05 (×3): 1000 mL
  Filled 2015-03-04 (×2): qty 1000

## 2015-03-04 MED ORDER — SALINE SPRAY 0.65 % NA SOLN
1.0000 | NASAL | Status: DC | PRN
  Administered 2015-03-04: 1 via NASAL
  Filled 2015-03-04: qty 44

## 2015-03-04 NOTE — Progress Notes (Addendum)
Initial Nutrition Assessment  DOCUMENTATION CODES:   Not applicable  INTERVENTION:  -Begin Osmolite 1.5 @ 15mL/hr increase by 10 every 8 hours to goal rate of 80 ml/hr. Run for 16 hours -200 mL free water flush every 4 hours for 16 hours -Tubefeeding regimen provides 1900 calories, 80g protein, and 1764cc free water. -MD monitor K, Mg, Phos for at least 3 days, patient is at high risk of refeeding, replete as needed.   NUTRITION DIAGNOSIS:   Inadequate oral intake related to inability to eat as evidenced by other (see comment) (PEG Tube, quadrapalegia.).  GOAL:   Patient will meet greater than or equal to 90% of their needs  MONITOR:   Labs, TF tolerance, Weight trends, Skin, I & O's  REASON FOR ASSESSMENT:   Low Braden    ASSESSMENT:   Terry Palmer is a 40 y.o. male with a past medical history of multiple sclerosis as a result of which he is bedbound. He is quadriparetic. He has a chronic indwelling Foley catheter. He has around-the-clock caregivers at home. Patient is unable to provide any history He admits with a UTI, fever of 100.9.  Visited Terry Palmer, there was no family at bedside.  Per RN, patient usually does 4 cans of Osmolite 1.5 @ 75 @ home, runs for approximately 12-13 hours. During stay, will provide a total of 1217mL of Osmolite 1.5 @ goal rate of 33mL/hr (318 mL more) to meet overall calorie and protein needs .  Nutrition-Focused physical exam completed. Findings are no fat depletion, moderate-severe muscle depletion, and no edema.   Labs and Medications reviewed.  Diet Order:  Diet NPO time specified  Skin:  Reviewed, no issues  Last BM:  2/21  Height:   Ht Readings from Last 1 Encounters:  03/03/15 6\' 3"  (1.905 m)    Weight:   Wt Readings from Last 1 Encounters:  03/03/15 170 lb (77.111 kg)    Ideal Body Weight:  89.09 kg  BMI:  Body mass index is 21.25 kg/(m^2).  Estimated Nutritional Needs:   Kcal:  1900-2100  Protein:   75-90 grams  Fluid:  >/= 1.9L  EDUCATION NEEDS:   No education needs identified at this time  Terry Anis. [name], MS, RD LDN After Hours/Weekend Pager [phone]

## 2015-03-04 NOTE — Progress Notes (Signed)
Preprocedure diagnosis: Neurogenic bladder, MS, gross hematuria Postprocedure diagnosis: Same  Procedure: Suprapubic tube change  Surgeon: Junious Silk  Indication for procedure: 40 year old with MS and SP tube admitted to hospital. Suprapubic tube catheter was changed a few days ago but it was ordered to be changed again. Per nurse the suprapubic tube was changed 3 hours ago but did not drain. The balloon was deflated but they noticed blood in the tubing and taped the new suprapubic tube in place.   Description of procedure: the new 94 French suprapubic tube was in place taped to the patient's abdomen, a long stringy clot in the tubing. I removed the tape and the suprapubic tube was backed out and began to drain clear urine. Therefore I readvanced it slightly and inflated the balloon with 14 mL. The balloon was mobile and the bladder and came back normally gets the bladder wall. The catheter was irrigated and had equal return of clear urine. I reviewed his CT scan from earlier in the evening which showed a suprapubic tube in place in normal position, normal kidneys without stone or hydronephrosis (official rad pending).

## 2015-03-04 NOTE — Progress Notes (Signed)
[facility] Progress Note   Terry Palmer  [id number]  DOB: 02/23/1975  DOA: 03/03/2015 PCP: [name], MD  Brief narrative: Terry Palmer is a 40 y.o. male with multiple sclerosis, suprapubic Foley who is cardioplegic. Throat and to the hospital for temperature of 100.9 and lethargy. Also noted was that his urine was dark and had some blood clots. It was decided that the patient's suprapubic catheter be changed. After it was changed, no urine was able to drain and therefore a urology consult was requested. Catheter was adjusted by the urologist and began to drain once again. Urine has been nonbloody since.   Subjective: Nonverbal. Family feels that he is more alert than yesterday.  Assessment/Plan: Principal Problem:   Fever, unspecified -He has been started on ceftriaxone for possible UTI-urine culture is growing multiple morphotypes and is unhelpful -No further fevers noted  Active Problems: -Hematuria with clots -Resolved after catheter change    Multiple sclerosis -   Quadriplegia and quadriparesis, nonverbal -Continue baclofen, dantrolene and Zanaflex -Continue PEG tube feeds    Seizure disorder  -Continue Keppra  Chronic leukocytosis -Placed secondary to prednisone that he uses daily   Antibiotics: Anti-infectives    Start     Dose/Rate Route Frequency Ordered Stop   03/04/15 1400  cefTRIAXone (ROCEPHIN) 1 g in dextrose 5 % 50 mL IVPB     1 g 100 mL/hr over 30 Minutes Intravenous Every 24 hours 03/03/15 1722     03/03/15 1330  cefTRIAXone (ROCEPHIN) 1 g in dextrose 5 % 50 mL IVPB     1 g Intravenous  Once 03/03/15 1326 03/03/15 1410     Code Status:     Code Status Orders        Start     Ordered   03/03/15 1723  Full code   Continuous     03/03/15 1722    Code Status History     Advance Directive Documentation        Most Recent Value   Type of Advance Directive  Healthcare Power of Attorney, Living will   Pre-existing out  of facility DNR order (yellow form or pink MOST form)     "MOST" Form in Place?       Family Communication:  Disposition Plan: Home in 1-2 days DVT prophylaxis: Lovenox Consultants:  Procedures:     Objective: Filed Weights   03/03/15 1741  Weight: 77.111 kg (170 lb)    Intake/Output Summary (Last 24 hours) at 03/04/15 1611 Last data filed at 03/04/15 1153  Gross per 24 hour  Intake 1491.25 ml  Output   2400 ml  Net -908.75 ml     Vitals Filed Vitals:   03/03/15 2051 03/03/15 2125 03/04/15 0511 03/04/15 1243  BP: 109/71  115/86 98/63  Pulse: 97  94 87  Temp: 100 F (37.8 C) 97.9 F (36.6 C) 99.4 F (37.4 C) 98.9 F (37.2 C)  TempSrc: Axillary Oral Oral Oral  Resp: 18  18 16   Height:      Weight:      SpO2: 100%  100% 100%    Exam:  General:  Pt is alert, nonverbal, contracted extremities not in acute distress  HEENT: No icterus, No thrush, oral mucosa moist  Cardiovascular: regular rate and rhythm, S1/S2 No murmur  Respiratory: clear to auscultation bilaterally   Abdomen: Soft, +Bowel sounds, non tender, non distended, no guarding-take tube and suprapubic catheter intact  MSK: No cyanosis or clubbing- no pedal  edema   Data Reviewed: Basic Metabolic Panel:  Recent Labs Lab 03/03/15 1049 03/04/15 0405  NA 134* 138  K 3.9 4.1  CL 102 105  CO2 25 24  GLUCOSE 90 76  BUN 12 7  CREATININE 0.56* 0.66  CALCIUM 8.7* 8.8*   Liver Function Tests:  Recent Labs Lab 03/03/15 1049 03/04/15 0405  AST 16 16  ALT 19 16*  ALKPHOS 82 74  BILITOT 0.6 0.5  PROT 7.0 6.7  ALBUMIN 3.9 3.6   No results for input(s): LIPASE, AMYLASE in the last 168 hours. No results for input(s): AMMONIA in the last 168 hours. CBC:  Recent Labs Lab 03/03/15 1049 03/04/15 0405  WBC 18.0* 18.6*  NEUTROABS 14.7*  --   HGB 12.4* 12.0*  HCT 38.2* 36.7*  MCV 95.7 95.1  PLT 260 212   Cardiac Enzymes:  Recent Labs Lab 03/03/15 1049  TROPONINI <0.03   BNP (last  3 results) No results for input(s): BNP in the last 8760 hours.  ProBNP (last 3 results) No results for input(s): PROBNP in the last 8760 hours.  CBG: No results for input(s): GLUCAP in the last 168 hours.  Recent Results (from the past 240 hour(s))  Urine culture     Status: None   Collection Time: 03/03/15 11:14 AM  Result Value Ref Range Status   Specimen Description URINE, CATHETERIZED  Final   Special Requests Normal  Final   Culture   Final    MULTIPLE SPECIES PRESENT, SUGGEST RECOLLECTION Performed at [facility]    Report Status 03/04/2015 FINAL  Final  MRSA PCR Screening     Status: None   Collection Time: 03/04/15  8:18 AM  Result Value Ref Range Status   MRSA by PCR NEGATIVE NEGATIVE Final    Comment:        The GeneXpert MRSA Assay (FDA approved for NASAL specimens only), is one component of a comprehensive MRSA colonization surveillance program. It is not intended to diagnose MRSA infection nor to guide or monitor treatment for MRSA infections.      Studies: Ct Abdomen Pelvis W Contrast  03/04/2015  CLINICAL DATA:  40 year old male with abdominal distention and colonic distention. Altered mental status. G-tube. Passing clots via Foley catheter. Fever. EXAM: CT ABDOMEN AND PELVIS WITH CONTRAST TECHNIQUE: Multidetector CT imaging of the abdomen and pelvis was performed using the standard protocol following bolus administration of intravenous contrast. CONTRAST:  76mL OMNIPAQUE IOHEXOL 300 MG/ML SOLN, 146mL OMNIPAQUE IOHEXOL 300 MG/ML SOLN COMPARISON:  CT the abdomen and pelvis 06/29/2013. FINDINGS: Lower chest:  Areas of scarring in the lung bases bilaterally. Hepatobiliary: No definite cystic or solid hepatic lesions. No intra or extrahepatic biliary ductal dilatation. Gallbladder is normal in appearance. Pancreas: No pancreatic mass. No pancreatic ductal dilatation. No pancreatic or peripancreatic fluid or inflammatory changes. Spleen: Unremarkable.  Adrenals/Urinary Tract: Bilateral adrenal glands and bilateral kidneys are normal in appearance. No hydroureteronephrosis. Urinary bladder is completely decompressed around an indwelling suprapubic catheter. Stomach/Bowel: The appearance of the stomach is normal. There is a percutaneous gastrostomy tube with retention balloon in the distal body of the stomach which appears appropriately located. No abnormal fluid collection along the tract for the gastrostomy tube. No pathologic dilatation of small bowel. The colon appears moderately distended with both fluid and gas. No focal mural thickening or surrounding inflammatory changes associated with the colon at this time. Normal appendix. Vascular/Lymphatic: Atherosclerosis throughout the abdominal and pelvic vasculature, without evidence of aneurysm or dissection. No  lymphadenopathy noted in the abdomen or pelvis. Reproductive: Prostate gland and seminal vesicles are unremarkable. Other: No significant volume of ascites.  No pneumoperitoneum. Musculoskeletal: Exaggerated lumbar lordosis. 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 There are no aggressive appearing lytic or blastic lesions noted in the visualized portions of the skeleton. IMPRESSION: 1. There is moderate distention of the colon with gas and fluid. This is nonspecific, and appears similar to some prior examinations. At this time, there are no areas of mural thickening or or focal inflammatory changes to strongly suggest a colitis. 2. No other acute findings noted in the abdomen or pelvis. 3. Additional incidental findings, as above. Electronically Signed   By: Vinnie Langton M.D.   On: 03/04/2015 07:58   Dg Chest Port 1 View  03/03/2015  CLINICAL DATA:  Shortness of breath, asthma, former smoker, multiple sclerosis, quadriplegia and quadraparesis EXAM: PORTABLE CHEST 1 VIEW COMPARISON:  Portable exam 1033 hours compared to  10/28/2014 FINDINGS: Low lung volumes. Elevation of RIGHT diaphragm with colon interposition between liver and diaphragm. Gaseous distention of colon throughout upper abdomen up to 11.5 cm diameter at hepatic flexure. Normal heart size. Bibasilar atelectasis. Upper lungs clear. Minimal central peribronchial thickening. No definite infiltrate, pleural effusion or pneumothorax. IMPRESSION: Decreased lung volumes with bronchitic changes and bibasilar atelectasis. Gaseous distention of colon up to 11.5 cm diameter, little changed versus 10/28/2014 exam when colon measured 10.9 cm diameter at same site. Electronically Signed   By: Lavonia Dana M.D.   On: 03/03/2015 11:13    Scheduled Meds:  Scheduled Meds: . acetaminophen  1,000 mg Per Tube BID  . antiseptic oral rinse  7 mL Mouth Rinse q12n4p  . baclofen  20 mg Per Tube QID  . buPROPion  75 mg Per Tube TID  . cefTRIAXone (ROCEPHIN)  IV  1 g Intravenous Q24H  . chlorhexidine  15 mL Mouth Rinse BID  . dantrolene  200 mg Oral QHS  . enoxaparin (LOVENOX) injection  40 mg Subcutaneous Q24H  . feeding supplement (JEVITY 1.5 CAL/FIBER)  1,000 mL Per Tube Q24H  . free water  240 mL Per Tube Q4H  . ipratropium-albuterol  3 mL Nebulization BID  . levETIRAcetam  1,000 mg Oral BID  . polyethylene glycol  17 g Per Tube Daily  . polyvinyl alcohol  1 drop Both Eyes TID  . predniSONE  25 mg Per Tube Q breakfast   Continuous Infusions: . sodium chloride 75 mL/hr at 03/04/15 1146    Time spent on care of this patient: 62 min   [place], MD 03/04/2015, 4:11 PM  LOS: 1 day   [facility] Office  [phone] Pager - Text Page per [email or web address] If 7PM-7AM, please contact night-coverage [email or web address]

## 2015-03-04 NOTE — ED Provider Notes (Signed)
CSN: [id number]     Arrival date & time 03/03/15  1007 History   First MD Initiated Contact with Patient 03/03/15 1024     Chief Complaint  Patient presents with  . Fever     (Consider location/radiation/quality/duration/timing/severity/associated sxs/prior Treatment) Patient is a 40 y.o. male presenting with fever. The history is provided by a caregiver (Patient has had a fever at home and has been more lethargic).  Fever Max temp prior to arrival:  Axillary temperature Severity:  Moderate Onset quality:  Sudden Timing:  Constant Progression:  Unchanged Chronicity:  New Relieved by:  Nothing Worsened by:  Nothing tried   Past Medical History  Diagnosis Date  . MS (multiple sclerosis) ([place])   . Quadriparesis (muscle weakness) ([place]) 03/12/2011  . Childhood asthma   . Depression   . Neuromuscular disorder ([place])     Quadraperesis  . Recurrent UTI   . Dysphagia   . Bladder calculi   . Neurogenic bladder   . Shortness of breath   . Recurrent upper respiratory infection (URI)   . Normocytic anemia 05/28/2011  . Aspiration pneumonia ([place]) 01/20/2013  . 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 Quadriplegia and quadriparesis ([place]) 12/24/2014  . 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 Lumbar puncture  10/12/2002  . Gastrostomy  04/16/2011    Procedure: GASTROSTOMY;  Surgeon: Zenovia Jarred, MD;  Location: [facility] OR;  Service: General;  Laterality: N/A;  Open G-Tube placement   Family History  Problem Relation Age of Onset  . Asthma Mother    Social History  Substance Use Topics  . Smoking status: Former Smoker    Types: Cigarettes    Quit date: 02/02/1999  . Smokeless tobacco: Former Systems developer  . Alcohol Use: No    Review of Systems  Unable to perform ROS: Patient nonverbal  Constitutional: Positive for fever.      Allergies  Review of patient's allergies indicates no known allergies.  Home Medications    Prior to Admission medications   Medication Sig Start Date End Date Taking? Authorizing Provider  acetaminophen (TYLENOL) 500 MG tablet Place 1,000 mg into feeding tube 2 (two) times daily.    Yes Historical Provider, MD  acetaminophen (TYLENOL) 500 MG tablet Take 500 mg by mouth every 4 (four) hours as needed for fever (or pain).   Yes Historical Provider, MD  albuterol (PROVENTIL) (2.5 MG/3ML) 0.083% nebulizer solution Take 2.5 mg by nebulization every 6 (six) hours as needed for wheezing or shortness of breath.   Yes Historical Provider, MD  baclofen (LIORESAL) 20 MG tablet Place 1 tablet (20 mg total) into feeding tube 3 (three) times daily. Patient taking differently: Place 20 mg into feeding tube 4 (four) times daily.  01/24/13  Yes Bonnielee Haff, MD  bisacodyl (DULCOLAX) 10 MG suppository Place 10 mg rectally once as needed for moderate constipation.    Yes Historical Provider, MD  buPROPion (WELLBUTRIN) 75 MG tablet Place 1 tablet (75 mg total) into feeding tube 3 (three) times daily. 01/24/13  Yes Bonnielee Haff, MD  cholecalciferol (VITAMIN D) 1000 UNITS tablet Take 1 tablet (1,000 Units total) by mouth 2 (two) times daily. Patient taking differently: Take 5,000 Units by mouth daily.  01/24/13  Yes Bonnielee Haff, MD  Cranberry Juice Powder 425 MG CAPS 1 capsule (425 mg total) by PEG Tube route 2 (two) times daily. 01/24/13  Yes Bonnielee Haff, MD  dantrolene (DANTRIUM) 50 MG capsule Take  200 mg by mouth at bedtime.    Yes Historical Provider, MD  Diaper Rash Products (DESITIN EX) Apply 1 application topically as needed (may apply to minor skin breakdown until healed).   Yes Historical Provider, MD  diazepam (VALIUM) 5 MG tablet Take 5 mg by mouth daily as needed for anxiety.    Yes Historical Provider, MD  fexofenadine (ALLEGRA) 180 MG tablet Place 180 mg into feeding tube daily.   Yes Historical Provider, MD  Garlic Oil 123XX123 MG CAPS Take 1,000 mg by mouth daily with breakfast.   Yes  Historical Provider, MD  guaiFENesin-dextromethorphan (ROBITUSSIN DM) 100-10 MG/5ML syrup Take 10 mLs by mouth every 4 (four) hours as needed for cough.   Yes Historical Provider, MD  HYDROcodone-acetaminophen (NORCO) 7.5-325 MG per tablet Place 1 tablet into feeding tube daily as needed (pain).    Yes Historical Provider, MD  ipratropium-albuterol (DUONEB) 0.5-2.5 (3) MG/3ML SOLN Take 3 mLs by nebulization 2 (two) times daily.   Yes Historical Provider, MD  Lactobacillus (ACIDOPHILUS PO) Place 1 capsule into feeding tube daily.    Yes Historical Provider, MD  levETIRAcetam (KEPPRA) 500 MG tablet Take 1,000 mg by mouth 2 (two) times daily.   Yes Historical Provider, MD  magnesium hydroxide (MILK OF MAGNESIA) 400 MG/5ML suspension Take 30 mLs by mouth every 12 (twelve) hours as needed for mild constipation.   Yes Historical Provider, MD  Multiple Vitamin (MULTIVITAMIN WITH MINERALS) TABS tablet Place 1 tablet into feeding tube daily. 01/24/13  Yes Bonnielee Haff, MD  NALTREXONE HCL PO Give 4.5 mg by tube at bedtime.   Yes Historical Provider, MD  neomycin-bacitracin-polymyxin (NEOSPORIN) [phone] ointment Apply 1 application topically daily as needed (for toes).   Yes Historical Provider, MD  Nutritional Supplements (FEEDING SUPPLEMENT, OSMOLITE 1.5 CAL,) LIQD Place 1,000 mLs into feeding tube continuous. Patient taking differently: Place 75 mL/hr into feeding tube See admin instructions. For 14 hours a day 11/02/14  Yes Robbie Lis, MD  nystatin (MYCOSTATIN/NYSTOP) 100000 UNIT/GM POWD Apply 1 g topically 3 (three) times daily. Patient taking differently: Apply 1 g topically daily. Applies to groin 01/24/13  Yes Bonnielee Haff, MD  nystatin (MYCOSTATIN/NYSTOP) 100000 UNIT/GM POWD Apply 1 g topically as needed (for groin yeast).   Yes Historical Provider, MD  omeprazole (PRILOSEC) 2 mg/mL SUSP Place 20 mLs (40 mg total) into feeding tube daily. 01/24/13  Yes Bonnielee Haff, MD  polyethylene glycol  (MIRALAX / GLYCOLAX) packet Place 17 g into feeding tube daily. 01/24/13  Yes Bonnielee Haff, MD  polyvinyl alcohol (LIQUIFILM TEARS) 1.4 % ophthalmic solution Place 1 drop into both eyes 3 (three) times daily. Patient taking differently: Place 1 drop into both eyes 2 (two) times daily.  01/24/13  Yes Bonnielee Haff, MD  predniSONE 5 MG/ML concentrated solution Place 2 mLs (10 mg total) into feeding tube daily with breakfast. Patient taking differently: Place 25 mg into feeding tube daily with breakfast.  11/02/14  Yes Robbie Lis, MD  Saline (SIMPLY SALINE) 0.9 % AERS Place 1 spray into both nostrils as needed (for nasal conjestion).   Yes Historical Provider, MD  senna (SENOKOT) 8.6 MG TABS tablet Take 2 tablets by mouth daily.   Yes Historical Provider, MD  sodium phosphate (FLEET) enema Place 1 enema rectally once as needed (for constipation). follow package directions   Yes Historical Provider, MD  tiZANidine (ZANAFLEX) 2 MG tablet Take 1 tablet (2 mg total) by mouth every 6 (six) hours as needed for muscle  spasms. 12/24/14  Yes Kathrynn Ducking, MD  vitamin C (ASCORBIC ACID) 500 MG tablet Place 500 mg into feeding tube 2 (two) times daily.   Yes Historical Provider, MD  Water For Irrigation, Sterile (FREE WATER) SOLN Place 240 mLs into feeding tube every 4 (four) hours. 01/24/13  Yes Bonnielee Haff, MD  antiseptic oral rinse (CPC / CETYLPYRIDINIUM CHLORIDE 0.05%) 0.05 % LIQD solution 7 mLs by Mouth Rinse route 2 times daily at 12 noon and 4 pm. Patient not taking: Reported on 12/24/2014 11/02/14   Robbie Lis, MD  ascorbic acid (VITAMIN C) 500 MG/5ML syrup Place 5 mLs (500 mg total) into feeding tube 2 (two) times daily. Patient not taking: Reported on 03/03/2015 01/24/13   Bonnielee Haff, MD  metoprolol tartrate (LOPRESSOR) 25 mg/10 mL SUSP Take 5 mLs (12.5 mg total) by mouth 2 (two) times daily. Patient not taking: Reported on 12/24/2014 11/02/14   Robbie Lis, MD  ondansetron (ZOFRAN) 4 MG  tablet Place 1 tablet (4 mg total) into feeding tube every 6 (six) hours as needed for nausea. Patient not taking: Reported on 12/24/2014 11/02/14   Robbie Lis, MD  pantoprazole sodium (PROTONIX) 40 mg/20 mL PACK Place 20 mLs (40 mg total) into feeding tube daily. Patient not taking: Reported on 12/24/2014 11/02/14   Robbie Lis, MD   BP 115/86 mmHg  Pulse 94  Temp(Src) 99.4 F (37.4 C) (Oral)  Resp 18  Ht 6\' 3"  (1.905 m)  Wt 170 lb (77.111 kg)  BMI 21.25 kg/m2  SpO2 100% Physical Exam  Constitutional:  Cachectic  HENT:  Head: Normocephalic.  Eyes: Conjunctivae and EOM are normal. No scleral icterus.  Neck: Neck supple. No thyromegaly present.  Cardiovascular: Normal rate and regular rhythm.  Exam reveals no gallop and no friction rub.   No murmur heard. Pulmonary/Chest: No stridor. He has no wheezes. He has no rales. He exhibits no tenderness.  Abdominal: He exhibits no distension. There is no tenderness. There is no rebound.  PEG tube placed  Musculoskeletal:  Patient has contractures from multiple sclerosis has minimal movement to arms and legs and is bedridden  Lymphadenopathy:    He has no cervical adenopathy.  Neurological: He exhibits normal muscle tone. Coordination normal.  Patient is lethargic  Skin: No rash noted. No erythema.    ED Course  Procedures (including critical care time) Labs Review Labs Reviewed  CBC WITH DIFFERENTIAL/PLATELET - Abnormal; Notable for the following:    WBC 18.0 (*)    RBC 3.99 (*)    Hemoglobin 12.4 (*)    HCT 38.2 (*)    Neutro Abs 14.7 (*)    Monocytes Absolute 1.6 (*)    All other components within normal limits  COMPREHENSIVE METABOLIC PANEL - Abnormal; Notable for the following:    Sodium 134 (*)    Creatinine, Ser 0.56 (*)    Calcium 8.7 (*)    All other components within normal limits  URINALYSIS, ROUTINE W REFLEX MICROSCOPIC (NOT AT [facility]) - Abnormal; Notable for the following:    Color, Urine AMBER (*)     APPearance TURBID (*)    pH 8.5 (*)    Hgb urine dipstick LARGE (*)    Protein, ur 100 (*)    Nitrite POSITIVE (*)    Leukocytes, UA LARGE (*)    All other components within normal limits  URINE MICROSCOPIC-ADD ON - Abnormal; Notable for the following:    Squamous Epithelial / LPF 6-30 (*)  Bacteria, UA MANY (*)    All other components within normal limits  COMPREHENSIVE METABOLIC PANEL - Abnormal; Notable for the following:    Calcium 8.8 (*)    ALT 16 (*)    All other components within normal limits  CBC - Abnormal; Notable for the following:    WBC 18.6 (*)    RBC 3.86 (*)    Hemoglobin 12.0 (*)    HCT 36.7 (*)    All other components within normal limits  URINE CULTURE  TROPONIN I  I-STAT CG4 LACTIC ACID, ED    Imaging Review Dg Chest Port 1 View  03/03/2015  CLINICAL DATA:  Shortness of breath, asthma, former smoker, multiple sclerosis, quadriplegia and quadraparesis EXAM: PORTABLE CHEST 1 VIEW COMPARISON:  Portable exam 1033 hours compared to 10/28/2014 FINDINGS: Low lung volumes. Elevation of RIGHT diaphragm with colon interposition between liver and diaphragm. Gaseous distention of colon throughout upper abdomen up to 11.5 cm diameter at hepatic flexure. Normal heart size. Bibasilar atelectasis. Upper lungs clear. Minimal central peribronchial thickening. No definite infiltrate, pleural effusion or pneumothorax. IMPRESSION: Decreased lung volumes with bronchitic changes and bibasilar atelectasis. Gaseous distention of colon up to 11.5 cm diameter, little changed versus 10/28/2014 exam when colon measured 10.9 cm diameter at same site. Electronically Signed   By: Lavonia Dana M.D.   On: 03/03/2015 11:13   I have personally reviewed and evaluated these images and lab results as part of my medical decision-making.   EKG Interpretation None      MDM   Final diagnoses:  UTI (lower urinary tract infection)    Patient has a urinary tract infection will admit for  antibiotics    Milton Ferguson, MD 03/04/15 [id number]

## 2015-03-04 NOTE — Progress Notes (Signed)
Per wife Kiandre Andry at home patient uses Osmolite 1.5 cal at 75 mls/hr.  At home she utilizes 4 250 ml cans and runs this from approximately 8 a.m. To 9 or 10 at night then peg is clamped through the night.  Also gives 250 mls free water flushes every 4 hours.

## 2015-03-04 NOTE — Progress Notes (Signed)
Yesterday, day shift had difficulty finding suprapubic catheter to exchange existing catheter.  On night shift, I notified urology floor charge nurse to help come place suprapubic catheter at her earliest convenience.  3 RN's were able to locate the right suprapubic catheter and came down around midnight to insert catheter.  Around 2 am, I noticed there was no urine in the bag.  Urology RN's were notified and came back down right away to assess the situation and try to get urine return.  They called me immediately when blood began to come out of the catheter.  Immediately [facility] was notified and the urologist on call for a perceived emergency.  Dr. Junious Silk arrived to the floor quickly and adjusted the catheter.  Clear yellow urine started flowing through the tubing and catheter appeared WNL.  Catheter has been working fine since Dr. Junious Silk came up.  Will continue to monitor. Azzie Glatter [place]

## 2015-03-05 DIAGNOSIS — T83511D Infection and inflammatory reaction due to indwelling urethral catheter, subsequent encounter: Secondary | ICD-10-CM

## 2015-03-05 DIAGNOSIS — N390 Urinary tract infection, site not specified: Secondary | ICD-10-CM

## 2015-03-05 LAB — BASIC METABOLIC PANEL
ANION GAP: 11 (ref 5–15)
BUN: 6 mg/dL (ref 6–20)
CHLORIDE: 107 mmol/L (ref 101–111)
CO2: 24 mmol/L (ref 22–32)
Calcium: 9 mg/dL (ref 8.9–10.3)
Creatinine, Ser: 0.59 mg/dL — ABNORMAL LOW (ref 0.61–1.24)
GFR calc non Af Amer: >60 mL/min (ref >60)
Glucose, Bld: 87 mg/dL (ref 65–99)
POTASSIUM: 3.8 mmol/L (ref 3.5–5.1)
SODIUM: 142 mmol/L (ref 135–145)

## 2015-03-05 LAB — CBC
HEMATOCRIT: 36 % — AB (ref 39.0–52.0)
Hemoglobin: 11.5 g/dL — ABNORMAL LOW (ref 13.0–17.0)
MCH: 30.7 pg (ref 26.0–34.0)
MCHC: 31.9 g/dL (ref 30.0–36.0)
MCV: 96 fL (ref 78.0–100.0)
Platelets: 208 10*3/uL (ref 150–400)
RBC: 3.75 MIL/uL — AB (ref 4.22–5.81)
RDW: 13.1 % (ref 11.5–15.5)
WBC: 19.1 10*3/uL — AB (ref 4.0–10.5)

## 2015-03-05 MED ORDER — CEFUROXIME AXETIL 250 MG/5ML PO SUSR: ORAL | Status: DC

## 2015-03-05 MED ORDER — CEFDINIR 250 MG/5ML PO SUSR: 300.0000 mg | Freq: Two times a day (BID) | ORAL | Status: DC

## 2015-03-05 MED ORDER — ACETAMINOPHEN 500 MG PO TABS: 500.0000 mg | ORAL_TABLET | Freq: Three times a day (TID) | ORAL | Status: AC | PRN

## 2015-03-05 NOTE — Clinical Social Work Note (Signed)
CSW received phone call that patient needed ambulance transport, [facility] contacted [facility] for transport to patient's home.  CSW to sign off, please reconsult if other social work needs arise.  Jones Broom. [place], MSW, [place] 03/05/2015 12:48 PM

## 2015-03-05 NOTE — Progress Notes (Signed)
Patient d/c home today via non emergency ambulance. Discharge instructions given to mother, verbalized understanding. All questions answered appropriately. Patient is alert and stable.

## 2015-03-05 NOTE — Discharge Summary (Addendum)
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 male with multiple sclerosis, suprapubic Foley who is cardioplegic. Throat and to the hospital for temperature of 100.9 and lethargy. Also noted was that his urine was dark and had some blood clots. It was decided that the patient's suprapubic catheter be changed. After it was changed, no urine was able to drain and therefore a urology consult was requested. Catheter was adjusted by the urologist and began to drain once again. Urine has been nonbloody since.  Principal Problem:  Fever, unspecified -He has been started on ceftriaxone for possible UTI- urine culture is growing multiple morphotypes and is unhelpful -Have transitioned him to cefuroxime to complete a 7 day course -No further fevers noted  Active Problems: Hematuria with clots -Resolved after Suprapubic catheter change   Multiple sclerosis - Quadriplegia and quadriparesis, nonverbal -Continue baclofen, dantrolene and Zanaflex -Continue PEG tube feeds   Seizure disorder  -Continue Keppra  Chronic leukocytosis -Possibly secondary to prednisone that he uses daily   Procedures:  suprapubic catheter change  Consultations:  Urology  Discharge Exam: Filed Weights   03/03/15 1741  Weight: 77.111 kg (170 lb)   Filed Vitals:   03/04/15 2127 03/05/15 0528  BP:  117/76  Pulse:  84  Temp: 98.6 F (37 C) 98.8 F (37.1 C)  Resp: 16 16    General: AAO x 3, no  distress Cardiovascular: RRR, no murmurs  Respiratory: clear to auscultation bilaterally GI: soft, non-tender, non-distended, bowel sound positive  Discharge Instructions You were cared for by a hospitalist during your hospital stay. If you have any questions about your discharge medications or the care you received while you were in the hospital after you are discharged, you can call the unit and asked to speak with the hospitalist on call if the hospitalist that took care of you is not available. Once you are discharged, your primary care physician will handle any further medical issues. Please note that NO REFILLS for any discharge medications will be authorized once you are discharged, as it is imperative that you return to your primary care physician (or establish a relationship with a primary care physician if you do not have one) for your aftercare needs so that they can reassess your need for medications and monitor your lab values.      Discharge Instructions    Discharge instructions    Complete by:  As directed   Continue tube feeds and free water as previously prescribed            Medication List    TAKE these medications        acetaminophen 500 MG tablet  Commonly known as:  TYLENOL  Place 1,000 mg into feeding tube 2 (two) times daily.     acetaminophen 500 MG tablet  Commonly known as:  TYLENOL  Take 1 tablet (500 mg total) by mouth every 8 (eight) hours as needed for fever (or pain).     ACIDOPHILUS PO  Place 1 capsule into feeding  tube daily.     albuterol (2.5 MG/3ML) 0.083% nebulizer solution  Commonly known as:  PROVENTIL  Take 2.5 mg by nebulization every 6 (six) hours as needed for wheezing or shortness of breath.     antiseptic oral rinse 0.05 % Liqd solution  Commonly known as:  CPC / CETYLPYRIDINIUM CHLORIDE 0.05%  7 mLs by Mouth Rinse route 2 times daily at 12 noon and 4 pm.     vitamin C 500 MG tablet  Commonly known as:  ASCORBIC ACID  Place  500 mg into feeding tube 2 (two) times daily.     ascorbic acid 500 MG/5ML syrup  Commonly known as:  VITAMIN C  Place 5 mLs (500 mg total) into feeding tube 2 (two) times daily.     baclofen 20 MG tablet  Commonly known as:  LIORESAL  Place 1 tablet (20 mg total) into feeding tube 3 (three) times daily.     bisacodyl 10 MG suppository  Commonly known as:  DULCOLAX  Place 10 mg rectally once as needed for moderate constipation.     buPROPion 75 MG tablet  Commonly known as:  WELLBUTRIN  Place 1 tablet (75 mg total) into feeding tube 3 (three) times daily.     cefUROXime 250 MG/5ML suspension  Commonly known as:  CEFTIN  250 mg twice a day for 6 days only     cholecalciferol 1000 units tablet  Commonly known as:  VITAMIN D  Take 1 tablet (1,000 Units total) by mouth 2 (two) times daily.     Cranberry Juice Powder 425 MG Caps  1 capsule (425 mg total) by PEG Tube route 2 (two) times daily.     dantrolene 50 MG capsule  Commonly known as:  DANTRIUM  Take 200 mg by mouth at bedtime.     DESITIN EX  Apply 1 application topically as needed (may apply to minor skin breakdown until healed).     diazepam 5 MG tablet  Commonly known as:  VALIUM  Take 5 mg by mouth daily as needed for anxiety.     feeding supplement (OSMOLITE 1.5 CAL) Liqd  Place 1,000 mLs into feeding tube continuous.     fexofenadine 180 MG tablet  Commonly known as:  ALLEGRA  Place 180 mg into feeding tube daily.     free water Soln  Place 240 mLs into feeding tube every 4 (four) hours.     Garlic Oil 123XX123 MG Caps  Take 1,000 mg by mouth daily with breakfast.     guaiFENesin-dextromethorphan 100-10 MG/5ML syrup  Commonly known as:  ROBITUSSIN DM  Take 10 mLs by mouth every 4 (four) hours as needed for cough.     HYDROcodone-acetaminophen 7.5-325 MG tablet  Commonly known as:  [place] 1 tablet into feeding tube daily as needed (pain).     ipratropium-albuterol 0.5-2.5 (3) MG/3ML Soln   Commonly known as:  DUONEB  Take 3 mLs by nebulization 2 (two) times daily.     levETIRAcetam 500 MG tablet  Commonly known as:  KEPPRA  Take 1,000 mg by mouth 2 (two) times daily.     magnesium hydroxide 400 MG/5ML suspension  Commonly known as:  MILK OF MAGNESIA  Take 30 mLs by mouth every 12 (twelve) hours as needed for mild constipation.     multivitamin with minerals Tabs tablet  Place 1 tablet into feeding tube daily.     NALTREXONE HCL PO  Give 4.5 mg by tube at bedtime.  neomycin-bacitracin-polymyxin [id number] ointment  Apply 1 application topically daily as needed (for toes).     nystatin 100000 UNIT/GM Powd  Apply 1 g topically 3 (three) times daily.     omeprazole 2 mg/mL Susp  Commonly known as:  PRILOSEC  Place 20 mLs (40 mg total) into feeding tube daily.     ondansetron 4 MG tablet  Commonly known as:  ZOFRAN  Place 1 tablet (4 mg total) into feeding tube every 6 (six) hours as needed for nausea.     pantoprazole sodium 40 mg/20 mL Pack  Commonly known as:  PROTONIX  Place 20 mLs (40 mg total) into feeding tube daily.     polyethylene glycol packet  Commonly known as:  MIRALAX / GLYCOLAX  Place 17 g into feeding tube daily.     polyvinyl alcohol 1.4 % ophthalmic solution  Commonly known as:  LIQUIFILM TEARS  Place 1 drop into both eyes 3 (three) times daily.     predniSONE 5 MG/ML concentrated solution  Place 2 mLs (10 mg total) into feeding tube daily with breakfast.     senna 8.6 MG Tabs tablet  Commonly known as:  SENOKOT  Take 2 tablets by mouth daily.     SIMPLY SALINE 0.9 % Aers  Generic drug:  Saline  Place 1 spray into both nostrils as needed (for nasal conjestion).     sodium phosphate enema  Commonly known as:  FLEET  Place 1 enema rectally once as needed (for constipation). follow package directions     tiZANidine 2 MG tablet  Commonly known as:  ZANAFLEX  Take 1 tablet (2 mg total) by mouth every 6 (six) hours as needed for  muscle spasms.       No Known Allergies    The results of significant diagnostics from this hospitalization (including imaging, microbiology, ancillary and laboratory) are listed below for reference.    Significant Diagnostic Studies: Ct Abdomen Pelvis W Contrast  03/04/2015  CLINICAL DATA:  40 year old male with abdominal distention and colonic distention. Altered mental status. G-tube. Passing clots via Foley catheter. Fever. EXAM: CT ABDOMEN AND PELVIS WITH CONTRAST TECHNIQUE: Multidetector CT imaging of the abdomen and pelvis was performed using the standard protocol following bolus administration of intravenous contrast. CONTRAST:  61mL OMNIPAQUE IOHEXOL 300 MG/ML SOLN, 16mL OMNIPAQUE IOHEXOL 300 MG/ML SOLN COMPARISON:  CT the abdomen and pelvis 06/29/2013. FINDINGS: Lower chest:  Areas of scarring in the lung bases bilaterally. Hepatobiliary: No definite cystic or solid hepatic lesions. No intra or extrahepatic biliary ductal dilatation. Gallbladder is normal in appearance. Pancreas: No pancreatic mass. No pancreatic ductal dilatation. No pancreatic or peripancreatic fluid or inflammatory changes. Spleen: Unremarkable. Adrenals/Urinary Tract: Bilateral adrenal glands and bilateral kidneys are normal in appearance. No hydroureteronephrosis. Urinary bladder is completely decompressed around an indwelling suprapubic catheter. Stomach/Bowel: The appearance of the stomach is normal. There is a percutaneous gastrostomy tube with retention balloon in the distal body of the stomach which appears appropriately located. No abnormal fluid collection along the tract for the gastrostomy tube. No pathologic dilatation of small bowel. The colon appears moderately distended with both fluid and gas. No focal mural thickening or surrounding inflammatory changes associated with the colon at this time. Normal appendix. Vascular/Lymphatic: Atherosclerosis throughout the abdominal and pelvic vasculature, without  evidence of aneurysm or dissection. No lymphadenopathy noted in the abdomen or pelvis. Reproductive: Prostate gland and seminal vesicles are unremarkable. Other: No significant volume of ascites.  No pneumoperitoneum. Musculoskeletal: Exaggerated lumbar lordosis.  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 There are no aggressive appearing lytic or blastic lesions noted in the visualized portions of the skeleton. IMPRESSION: 1. There is moderate distention of the colon with gas and fluid. This is nonspecific, and appears similar to some prior examinations. At this time, there are no areas of mural thickening or or focal inflammatory changes to strongly suggest a colitis. 2. No other acute findings noted in the abdomen or pelvis. 3. Additional incidental findings, as above. Electronically Signed   By: Vinnie Langton M.D.   On: 03/04/2015 07:58   Dg Chest Port 1 View  03/03/2015  CLINICAL DATA:  Shortness of breath, asthma, former smoker, multiple sclerosis, quadriplegia and quadraparesis EXAM: PORTABLE CHEST 1 VIEW COMPARISON:  Portable exam 1033 hours compared to 10/28/2014 FINDINGS: Low lung volumes. Elevation of RIGHT diaphragm with colon interposition between liver and diaphragm. Gaseous distention of colon throughout upper abdomen up to 11.5 cm diameter at hepatic flexure. Normal heart size. Bibasilar atelectasis. Upper lungs clear. Minimal central peribronchial thickening. No definite infiltrate, pleural effusion or pneumothorax. IMPRESSION: Decreased lung volumes with bronchitic changes and bibasilar atelectasis. Gaseous distention of colon up to 11.5 cm diameter, little changed versus 10/28/2014 exam when colon measured 10.9 cm diameter at same site. Electronically Signed   By: Lavonia Dana M.D.   On: 03/03/2015 11:13    Microbiology: Recent Results (from the past 240 hour(s))  Urine culture     Status: None   Collection  Time: 03/03/15 11:14 AM  Result Value Ref Range Status   Specimen Description URINE, CATHETERIZED  Final   Special Requests Normal  Final   Culture   Final    MULTIPLE SPECIES PRESENT, SUGGEST RECOLLECTION Performed at [facility]    Report Status 03/04/2015 FINAL  Final  MRSA PCR Screening     Status: None   Collection Time: 03/04/15  8:18 AM  Result Value Ref Range Status   MRSA by PCR NEGATIVE NEGATIVE Final    Comment:        The GeneXpert MRSA Assay (FDA approved for NASAL specimens only), is one component of a comprehensive MRSA colonization surveillance program. It is not intended to diagnose MRSA infection nor to guide or monitor treatment for MRSA infections.      Labs: Basic Metabolic Panel:  Recent Labs Lab 03/03/15 1049 03/04/15 0405 03/05/15 0347  NA 134* 138 142  K 3.9 4.1 3.8  CL 102 105 107  CO2 25 24 24   GLUCOSE 90 76 87  BUN 12 7 6   CREATININE 0.56* 0.66 0.59*  CALCIUM 8.7* 8.8* 9.0   Liver Function Tests:  Recent Labs Lab 03/03/15 1049 03/04/15 0405  AST 16 16  ALT 19 16*  ALKPHOS 82 74  BILITOT 0.6 0.5  PROT 7.0 6.7  ALBUMIN 3.9 3.6   No results for input(s): LIPASE, AMYLASE in the last 168 hours. No results for input(s): AMMONIA in the last 168 hours. CBC:  Recent Labs Lab 03/03/15 1049 03/04/15 0405 03/05/15 0347  WBC 18.0* 18.6* 19.1*  NEUTROABS 14.7*  --   --   HGB 12.4* 12.0* 11.5*  HCT 38.2* 36.7* 36.0*  MCV 95.7 95.1 96.0  PLT 260 212 208   Cardiac Enzymes:  Recent Labs Lab 03/03/15 1049  TROPONINI <0.03   BNP: BNP (last 3 results) No results for input(s): BNP in the last 8760 hours.  ProBNP (last 3 results) No results for  input(s): PROBNP in the last 8760 hours.  CBG: No results for input(s): GLUCAP in the last 168 hours.     SignedDebbe Odea, MD [facility] 03/05/2015, 11:57 AM

## 2015-03-30 ENCOUNTER — Other Ambulatory Visit: Payer: Self-pay

## 2015-03-30 MED ORDER — TIZANIDINE HCL 2 MG PO TABS: 2.0000 mg | ORAL_TABLET | Freq: Four times a day (QID) | ORAL | Status: DC | PRN

## 2015-04-28 ENCOUNTER — Ambulatory Visit: Payer: Medicare HMO | Admitting: Nurse Practitioner

## 2015-05-25 ENCOUNTER — Inpatient Hospital Stay (HOSPITAL_COMMUNITY)
Admission: EM | Admit: 2015-05-25 | Discharge: 2015-05-29 | DRG: 871 | Disposition: A | Discharge status: Home / Self Care | Payer: Medicare HMO | Attending: Internal Medicine | Admitting: Internal Medicine

## 2015-05-25 ENCOUNTER — Emergency Department (HOSPITAL_COMMUNITY): Payer: Medicare HMO

## 2015-05-25 ENCOUNTER — Encounter (HOSPITAL_COMMUNITY): Payer: Self-pay

## 2015-05-25 DIAGNOSIS — G35 Multiple sclerosis: Secondary | ICD-10-CM | POA: Diagnosis present

## 2015-05-25 DIAGNOSIS — E43 Unspecified severe protein-calorie malnutrition: Secondary | ICD-10-CM | POA: Diagnosis present

## 2015-05-25 DIAGNOSIS — G9340 Encephalopathy, unspecified: Secondary | ICD-10-CM | POA: Insufficient documentation

## 2015-05-25 DIAGNOSIS — Z87891 Personal history of nicotine dependence: Secondary | ICD-10-CM | POA: Diagnosis not present

## 2015-05-25 DIAGNOSIS — B952 Enterococcus as the cause of diseases classified elsewhere: Secondary | ICD-10-CM | POA: Diagnosis present

## 2015-05-25 DIAGNOSIS — N319 Neuromuscular dysfunction of bladder, unspecified: Secondary | ICD-10-CM | POA: Diagnosis present

## 2015-05-25 DIAGNOSIS — B964 Proteus (mirabilis) (morganii) as the cause of diseases classified elsewhere: Secondary | ICD-10-CM | POA: Diagnosis present

## 2015-05-25 DIAGNOSIS — R7881 Bacteremia: Secondary | ICD-10-CM

## 2015-05-25 DIAGNOSIS — Z931 Gastrostomy status: Secondary | ICD-10-CM

## 2015-05-25 DIAGNOSIS — N390 Urinary tract infection, site not specified: Secondary | ICD-10-CM | POA: Diagnosis present

## 2015-05-25 DIAGNOSIS — Z6821 Body mass index (BMI) 21.0-21.9, adult: Secondary | ICD-10-CM

## 2015-05-25 DIAGNOSIS — G40909 Epilepsy, unspecified, not intractable, without status epilepticus: Secondary | ICD-10-CM

## 2015-05-25 DIAGNOSIS — Z87440 Personal history of urinary (tract) infections: Secondary | ICD-10-CM | POA: Diagnosis not present

## 2015-05-25 DIAGNOSIS — D72829 Elevated white blood cell count, unspecified: Secondary | ICD-10-CM | POA: Diagnosis present

## 2015-05-25 DIAGNOSIS — R4182 Altered mental status, unspecified: Secondary | ICD-10-CM | POA: Diagnosis present

## 2015-05-25 DIAGNOSIS — R627 Adult failure to thrive: Secondary | ICD-10-CM | POA: Diagnosis present

## 2015-05-25 DIAGNOSIS — Z8701 Personal history of pneumonia (recurrent): Secondary | ICD-10-CM

## 2015-05-25 DIAGNOSIS — G8250 Quadriplegia, unspecified: Secondary | ICD-10-CM | POA: Diagnosis present

## 2015-05-25 DIAGNOSIS — R509 Fever, unspecified: Secondary | ICD-10-CM | POA: Diagnosis not present

## 2015-05-25 DIAGNOSIS — B9689 Other specified bacterial agents as the cause of diseases classified elsewhere: Secondary | ICD-10-CM | POA: Diagnosis not present

## 2015-05-25 DIAGNOSIS — F0280 Dementia in other diseases classified elsewhere without behavioral disturbance: Secondary | ICD-10-CM | POA: Diagnosis present

## 2015-05-25 DIAGNOSIS — A419 Sepsis, unspecified organism: Principal | ICD-10-CM

## 2015-05-25 LAB — URINALYSIS, ROUTINE W REFLEX MICROSCOPIC
BILIRUBIN URINE: NEGATIVE
Glucose, UA: NEGATIVE mg/dL
HGB URINE DIPSTICK: NEGATIVE
Ketones, ur: NEGATIVE mg/dL
Nitrite: NEGATIVE
PROTEIN: 30 mg/dL — AB
Specific Gravity, Urine: 1.025 (ref 1.005–1.030)
pH: 8.5 — ABNORMAL HIGH (ref 5.0–8.0)

## 2015-05-25 LAB — CBC WITH DIFFERENTIAL/PLATELET
BASOS PCT: 0 %
Basophils Absolute: 0 10*3/uL (ref 0.0–0.1)
EOS ABS: 0 10*3/uL (ref 0.0–0.7)
Eosinophils Relative: 0 %
HEMATOCRIT: 42.9 % (ref 39.0–52.0)
HEMOGLOBIN: 14 g/dL (ref 13.0–17.0)
Lymphocytes Relative: 3 %
Lymphs Abs: 0.6 10*3/uL — ABNORMAL LOW (ref 0.7–4.0)
MCH: 30.6 pg (ref 26.0–34.0)
MCHC: 32.6 g/dL (ref 30.0–36.0)
MCV: 93.9 fL (ref 78.0–100.0)
Monocytes Absolute: 1.6 10*3/uL — ABNORMAL HIGH (ref 0.1–1.0)
Monocytes Relative: 7 %
NEUTROS ABS: 20.3 10*3/uL — AB (ref 1.7–7.7)
NEUTROS PCT: 90 %
PLATELETS: 238 10*3/uL (ref 150–400)
RBC: 4.57 MIL/uL (ref 4.22–5.81)
RDW: 12.9 % (ref 11.5–15.5)
WBC: 22.6 10*3/uL — AB (ref 4.0–10.5)

## 2015-05-25 LAB — COMPREHENSIVE METABOLIC PANEL
ALT: 24 U/L (ref 17–63)
ANION GAP: 16 — AB (ref 5–15)
AST: 23 U/L (ref 15–41)
Albumin: 4.2 g/dL (ref 3.5–5.0)
Alkaline Phosphatase: 89 U/L (ref 38–126)
BUN: 11 mg/dL (ref 6–20)
CALCIUM: 9.8 mg/dL (ref 8.9–10.3)
CHLORIDE: 99 mmol/L — AB (ref 101–111)
CO2: 25 mmol/L (ref 22–32)
CREATININE: 0.75 mg/dL (ref 0.61–1.24)
Glucose, Bld: 100 mg/dL — ABNORMAL HIGH (ref 65–99)
Potassium: 4.6 mmol/L (ref 3.5–5.1)
Sodium: 140 mmol/L (ref 135–145)
Total Bilirubin: 0.4 mg/dL (ref 0.3–1.2)
Total Protein: 7.5 g/dL (ref 6.5–8.1)

## 2015-05-25 LAB — URINE MICROSCOPIC-ADD ON

## 2015-05-25 LAB — CBC
HEMATOCRIT: 36.9 % — AB (ref 39.0–52.0)
Hemoglobin: 12.3 g/dL — ABNORMAL LOW (ref 13.0–17.0)
MCH: 31.3 pg (ref 26.0–34.0)
MCHC: 33.3 g/dL (ref 30.0–36.0)
MCV: 93.9 fL (ref 78.0–100.0)
PLATELETS: 207 10*3/uL (ref 150–400)
RBC: 3.93 MIL/uL — AB (ref 4.22–5.81)
RDW: 12.8 % (ref 11.5–15.5)
WBC: 22.8 10*3/uL — AB (ref 4.0–10.5)

## 2015-05-25 LAB — CREATININE, SERUM: Creatinine, Ser: 0.6 mg/dL — ABNORMAL LOW (ref 0.61–1.24)

## 2015-05-25 LAB — MRSA PCR SCREENING: MRSA BY PCR: NEGATIVE

## 2015-05-25 LAB — I-STAT CG4 LACTIC ACID, ED
LACTIC ACID, VENOUS: 2.17 mmol/L — AB (ref 0.5–2.0)
Lactic Acid, Venous: 3.07 mmol/L (ref 0.5–2.0)

## 2015-05-25 MED ORDER — SODIUM CHLORIDE 0.9 % IV SOLN
Freq: Once | INTRAVENOUS | Status: AC
  Administered 2015-05-25: 15:00:00 via INTRAVENOUS

## 2015-05-25 MED ORDER — LEVETIRACETAM 100 MG/ML PO SOLN
1500.0000 mg | Freq: Two times a day (BID) | ORAL | Status: DC
  Administered 2015-05-26 – 2015-05-29 (×7): 1500 mg
  Filled 2015-05-25 (×8): qty 15

## 2015-05-25 MED ORDER — SODIUM CHLORIDE 0.9 % IV BOLUS (SEPSIS)
500.0000 mL | Freq: Once | INTRAVENOUS | Status: AC
  Administered 2015-05-25: 500 mL via INTRAVENOUS

## 2015-05-25 MED ORDER — PIPERACILLIN-TAZOBACTAM 3.375 G IVPB
3.3750 g | Freq: Three times a day (TID) | INTRAVENOUS | Status: DC
  Administered 2015-05-25 – 2015-05-29 (×11): 3.375 g via INTRAVENOUS
  Filled 2015-05-25 (×14): qty 50

## 2015-05-25 MED ORDER — NYSTATIN 100000 UNIT/GM EX POWD
Freq: Three times a day (TID) | CUTANEOUS | Status: DC
  Administered 2015-05-25 – 2015-05-29 (×11): via TOPICAL
  Filled 2015-05-25: qty 15

## 2015-05-25 MED ORDER — SODIUM CHLORIDE 0.9 % IV BOLUS (SEPSIS)
2000.0000 mL | Freq: Once | INTRAVENOUS | Status: AC
  Administered 2015-05-25: 2000 mL via INTRAVENOUS

## 2015-05-25 MED ORDER — SODIUM CHLORIDE 0.9 % IV SOLN
1500.0000 mg | Freq: Once | INTRAVENOUS | Status: AC
  Administered 2015-05-25: 1500 mg via INTRAVENOUS
  Filled 2015-05-25: qty 15

## 2015-05-25 MED ORDER — KETOROLAC TROMETHAMINE 30 MG/ML IJ SOLN
30.0000 mg | Freq: Once | INTRAMUSCULAR | Status: AC
  Administered 2015-05-25: 30 mg via INTRAVENOUS
  Filled 2015-05-25: qty 1

## 2015-05-25 MED ORDER — LEVETIRACETAM 500 MG PO TABS: 1000.0000 mg | ORAL_TABLET | Freq: Two times a day (BID) | ORAL | Status: DC

## 2015-05-25 MED ORDER — VANCOMYCIN HCL 10 G IV SOLR
2000.0000 mg | Freq: Once | INTRAVENOUS | Status: AC
  Administered 2015-05-25: 2000 mg via INTRAVENOUS
  Filled 2015-05-25: qty 2000

## 2015-05-25 MED ORDER — PIPERACILLIN-TAZOBACTAM 3.375 G IVPB 30 MIN
3.3750 g | Freq: Once | INTRAVENOUS | Status: AC
  Administered 2015-05-25: 3.375 g via INTRAVENOUS
  Filled 2015-05-25: qty 50

## 2015-05-25 MED ORDER — ACETAMINOPHEN 650 MG RE SUPP
650.0000 mg | Freq: Once | RECTAL | Status: AC
  Administered 2015-05-25: 650 mg via RECTAL
  Filled 2015-05-25: qty 1

## 2015-05-25 MED ORDER — CHLORHEXIDINE GLUCONATE 0.12 % MT SOLN
15.0000 mL | Freq: Two times a day (BID) | OROMUCOSAL | Status: DC
  Administered 2015-05-26 – 2015-05-29 (×8): 15 mL via OROMUCOSAL
  Filled 2015-05-25 (×7): qty 15

## 2015-05-25 MED ORDER — PREDNISONE 5 MG/ML PO CONC
25.0000 mg | Freq: Every day | ORAL | Status: DC
  Administered 2015-05-25 – 2015-05-29 (×4): 25 mg
  Filled 2015-05-25 (×4): qty 5

## 2015-05-25 MED ORDER — CETYLPYRIDINIUM CHLORIDE 0.05 % MT LIQD
7.0000 mL | Freq: Two times a day (BID) | OROMUCOSAL | Status: DC
  Administered 2015-05-26 – 2015-05-29 (×6): 7 mL via OROMUCOSAL

## 2015-05-25 MED ORDER — FREE WATER
240.0000 mL | Status: DC
  Administered 2015-05-25 – 2015-05-29 (×17): 240 mL

## 2015-05-25 MED ORDER — ACETAMINOPHEN 500 MG PO TABS: 500.0000 mg | ORAL_TABLET | Freq: Three times a day (TID) | ORAL | Status: DC | PRN

## 2015-05-25 MED ORDER — PANTOPRAZOLE SODIUM 40 MG PO PACK
40.0000 mg | PACK | Freq: Every day | ORAL | Status: DC
  Administered 2015-05-25 – 2015-05-29 (×5): 40 mg
  Filled 2015-05-25 (×4): qty 20

## 2015-05-25 MED ORDER — DANTROLENE SODIUM 100 MG PO CAPS
200.0000 mg | ORAL_CAPSULE | Freq: Every day | ORAL | Status: DC
  Administered 2015-05-25 – 2015-05-28 (×4): 200 mg via ORAL
  Filled 2015-05-25 (×4): qty 2

## 2015-05-25 MED ORDER — NYSTATIN 100000 UNIT/GM EX POWD: 1.0000 g | Freq: Three times a day (TID) | CUTANEOUS | Status: DC

## 2015-05-25 MED ORDER — ACETAMINOPHEN 650 MG RE SUPP
975.0000 mg | Freq: Once | RECTAL | Status: AC
  Administered 2015-05-25: 975 mg via RECTAL
  Filled 2015-05-25: qty 1

## 2015-05-25 MED ORDER — SENNA 8.6 MG PO TABS
2.0000 | ORAL_TABLET | Freq: Every day | ORAL | Status: DC
  Administered 2015-05-25 – 2015-05-29 (×5): 17.2 mg via ORAL
  Filled 2015-05-25 (×5): qty 2

## 2015-05-25 MED ORDER — ONDANSETRON HCL 4 MG PO TABS: 4.0000 mg | ORAL_TABLET | Freq: Four times a day (QID) | ORAL | Status: DC | PRN

## 2015-05-25 MED ORDER — BUPROPION HCL 75 MG PO TABS
75.0000 mg | ORAL_TABLET | Freq: Three times a day (TID) | ORAL | Status: DC
  Administered 2015-05-25 – 2015-05-29 (×11): 75 mg
  Filled 2015-05-25 (×13): qty 1

## 2015-05-25 MED ORDER — TIZANIDINE HCL 4 MG PO TABS: 2.0000 mg | ORAL_TABLET | Freq: Four times a day (QID) | ORAL | Status: DC | PRN

## 2015-05-25 MED ORDER — BACLOFEN 10 MG PO TABS
20.0000 mg | ORAL_TABLET | Freq: Three times a day (TID) | ORAL | Status: DC
  Administered 2015-05-25 – 2015-05-29 (×11): 20 mg
  Filled 2015-05-25: qty 1
  Filled 2015-05-25: qty 2
  Filled 2015-05-25: qty 1
  Filled 2015-05-25: qty 2
  Filled 2015-05-25 (×2): qty 1
  Filled 2015-05-25 (×3): qty 2
  Filled 2015-05-25 (×2): qty 1
  Filled 2015-05-25 (×3): qty 2
  Filled 2015-05-25: qty 1
  Filled 2015-05-25 (×2): qty 2
  Filled 2015-05-25: qty 1
  Filled 2015-05-25: qty 2

## 2015-05-25 MED ORDER — SODIUM CHLORIDE 0.9 % IV SOLN
INTRAVENOUS | Status: AC
  Administered 2015-05-25: 18:00:00 via INTRAVENOUS

## 2015-05-25 MED ORDER — BISACODYL 10 MG RE SUPP: 10.0000 mg | Freq: Once | RECTAL | Status: DC | PRN

## 2015-05-25 MED ORDER — VANCOMYCIN HCL IN DEXTROSE 1-5 GM/200ML-% IV SOLN
1000.0000 mg | Freq: Two times a day (BID) | INTRAVENOUS | Status: DC
  Administered 2015-05-25 – 2015-05-27 (×4): 1000 mg via INTRAVENOUS
  Filled 2015-05-25 (×5): qty 200

## 2015-05-25 MED ORDER — HYDROCODONE-ACETAMINOPHEN 7.5-325 MG PO TABS: 1.0000 | ORAL_TABLET | Freq: Every day | ORAL | Status: DC | PRN

## 2015-05-25 MED ORDER — POLYVINYL ALCOHOL 1.4 % OP SOLN
1.0000 [drp] | Freq: Three times a day (TID) | OPHTHALMIC | Status: DC
  Administered 2015-05-25 – 2015-05-29 (×11): 1 [drp] via OPHTHALMIC
  Filled 2015-05-25: qty 15

## 2015-05-25 MED ORDER — ENOXAPARIN SODIUM 40 MG/0.4ML ~~LOC~~ SOLN
40.0000 mg | SUBCUTANEOUS | Status: DC
Start: 2015-05-25 — End: 2015-05-29
  Administered 2015-05-25 – 2015-05-28 (×4): 40 mg via SUBCUTANEOUS
  Filled 2015-05-25 (×4): qty 0.4

## 2015-05-25 NOTE — ED Provider Notes (Signed)
Medical screening examination/treatment/procedure(s) were conducted as a shared visit with non-physician practitioner(s) and myself.  I personally evaluated the patient during the encounter.   EKG Interpretation   Date/Time:  Wednesday May 25 2015 10:05:01 EDT Ventricular Rate:  112 PR Interval:  129 QRS Duration: 84 QT Interval:  333 QTC Calculation: [id number] R Axis:   63 Text Interpretation:  Sinus tachycardia Borderline T wave abnormalities  Confirmed by Rogene Houston  MD, Rowan Pollman [phone]) on 05/25/2015 10:34:03 AM      Results for orders placed or performed during the hospital encounter of 05/25/15  Comprehensive metabolic panel  Result Value Ref Range   Sodium 140 135 - 145 mmol/L   Potassium 4.6 3.5 - 5.1 mmol/L   Chloride 99 (L) 101 - 111 mmol/L   CO2 25 22 - 32 mmol/L   Glucose, Bld 100 (H) 65 - 99 mg/dL   BUN 11 6 - 20 mg/dL   Creatinine, Ser 0.75 0.61 - 1.24 mg/dL   Calcium 9.8 8.9 - 10.3 mg/dL   Total Protein 7.5 6.5 - 8.1 g/dL   Albumin 4.2 3.5 - 5.0 g/dL   AST 23 15 - 41 U/L   ALT 24 17 - 63 U/L   Alkaline Phosphatase 89 38 - 126 U/L   Total Bilirubin 0.4 0.3 - 1.2 mg/dL   GFR calc non Af Amer >60 >60 mL/min   GFR calc Af Amer >60 >60 mL/min   Anion gap 16 (H) 5 - 15  CBC WITH DIFFERENTIAL  Result Value Ref Range   WBC 22.6 (H) 4.0 - 10.5 K/uL   RBC 4.57 4.22 - 5.81 MIL/uL   Hemoglobin 14.0 13.0 - 17.0 g/dL   HCT 42.9 39.0 - 52.0 %   MCV 93.9 78.0 - 100.0 fL   MCH 30.6 26.0 - 34.0 pg   MCHC 32.6 30.0 - 36.0 g/dL   RDW 12.9 11.5 - 15.5 %   Platelets 238 150 - 400 K/uL   Neutrophils Relative % 90 %   Neutro Abs 20.3 (H) 1.7 - 7.7 K/uL   Lymphocytes Relative 3 %   Lymphs Abs 0.6 (L) 0.7 - 4.0 K/uL   Monocytes Relative 7 %   Monocytes Absolute 1.6 (H) 0.1 - 1.0 K/uL   Eosinophils Relative 0 %   Eosinophils Absolute 0.0 0.0 - 0.7 K/uL   Basophils Relative 0 %   Basophils Absolute 0.0 0.0 - 0.1 K/uL  Urinalysis, Routine w reflex microscopic (not at [facility])  Result  Value Ref Range   Color, Urine YELLOW YELLOW   APPearance TURBID (A) CLEAR   Specific Gravity, Urine 1.025 1.005 - 1.030   pH 8.5 (H) 5.0 - 8.0   Glucose, UA NEGATIVE NEGATIVE mg/dL   Hgb urine dipstick NEGATIVE NEGATIVE   Bilirubin Urine NEGATIVE NEGATIVE   Ketones, ur NEGATIVE NEGATIVE mg/dL   Protein, ur 30 (A) NEGATIVE mg/dL   Nitrite NEGATIVE NEGATIVE   Leukocytes, UA LARGE (A) NEGATIVE  Urine microscopic-add on  Result Value Ref Range   Squamous Epithelial / LPF 0-5 (A) NONE SEEN   WBC, UA 6-30 0 - 5 WBC/hpf   RBC / HPF 0-5 0 - 5 RBC/hpf   Bacteria, UA MANY (A) NONE SEEN   Crystals TRIPLE PHOSPHATE CRYSTALS (A) NEGATIVE   Urine-Other LESS THAN 10 mL OF URINE SUBMITTED   I-Stat CG4 Lactic Acid, ED  (not at  [facility])  Result Value Ref Range   Lactic Acid, Venous 3.07 (HH) 0.5 - 2.0 mmol/L  Comment NOTIFIED PHYSICIAN    Dg Chest Port 1 View  05/25/2015  CLINICAL DATA:  Fevers EXAM: PORTABLE CHEST 1 VIEW COMPARISON:  03/03/2015 FINDINGS: Cardiac shadow is within normal limits. The lungs are well aerated bilaterally. No focal infiltrate or sizable effusion is seen. No acute bony abnormality is noted. IMPRESSION: No active disease. Electronically Signed   By: Inez Catalina M.D.   On: 05/25/2015 11:32    Patient brought in by EMS. Seen by me along with the physician assistant. Patient brought in for fever and worsening mental status but mental status is close to baseline. Patient is a quadriplegic from [place]. Lives at home with wife and caregiver. Patient's had several admissions in the past for infections. Patient does have a suprapubic catheter in place. Patient also did have an episode of vomiting this morning so it possibly could be aspiration. Patient started on septic protocols.  Critical care time to be documented by physician assistant.   Patient started on septic protocols based on his vital signs. He was tachycardic and febrile and occasionally tachypnea. Following cultures  patient started on broad-spectrum antibiotics. Patient also started on IV fluids his target is 2.5 L. Patient showing some improvement at the heart rate is coming down most recently around 100 never is been hypotensive blood pressures always been [id number] systolic or better.   Workup urinalysis does show evidence of contamination possible source would have to rely on cultures. Chest x-ray currently negative for any evidence of pneumonia. Patient has a marked leukocytosis. And lactic acid was elevated. All consistent with a picture of sepsis.   Patient's neuro exam is baseline. Patient tachycardic on heart exam lungs essentially clear. Abdomen was suprapubic catheter. No evidence of any source of infection on exam.   Fredia Sorrow, MD 05/25/15 1148

## 2015-05-25 NOTE — Consult Note (Signed)
NEURO HOSPITALIST CONSULT NOTE      Reason for Consult: Seizure    History obtained from:  Family and Chart  HPI:                                                                                                                                          Terry Palmer is an 40 y.o. man with PMHx significant for end-stage multiple sclerosis, quadriplegia & quadriparesis, dementia d/t MS, aspiration PNA, recurrent UTI, and seizures who presents to the ED for altered mental status and fever. Has poor baseline, non verbal. Last night had some twitching of his lips noticed by his wife. This morning she noticed that he was lying in his own emesis as well as having had a bowel movement in the bed.In the ED, patient was noted to be febrile to 102 and tachycardic. He has chronic leukocytosis but his WBC was elevated above baseline to 22.6. He had a lactic acidosis of 3.07 which has come down to 2.17 with IV fluid resuscitation. He was started on Vanc and Zosyn in the ED for possible sepsis.  Past Medical History  Diagnosis Date  . MS (multiple sclerosis) ([place])   . Quadriparesis (muscle weakness) ([place]) 03/12/2011  . Childhood asthma   . Depression   . Neuromuscular disorder ([place])     Quadraperesis  . Recurrent UTI   . Dysphagia   . Bladder calculi   . Neurogenic bladder   . Shortness of breath   . Recurrent upper respiratory infection (URI)   . Normocytic anemia 05/28/2011  . Aspiration pneumonia ([place]) 01/20/2013  . 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 Quadriplegia and quadriparesis ([place]) 12/24/2014  . 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 Lumbar puncture  10/12/2002  . Gastrostomy  04/16/2011    Procedure: GASTROSTOMY;  Surgeon: Zenovia Jarred, MD;  Location: [facility] OR;  Service: General;  Laterality: N/A;  Open G-Tube placement    Family History  Problem Relation Age of Onset  . Asthma Mother        Social History:  reports that he quit smoking about 16 years ago. His smoking use included Cigarettes. He has quit using smokeless tobacco. He reports that he does not drink alcohol. His drug history is not on file.  No Known Allergies  MEDICATIONS:  I have reviewed the patient's current medications.   ROS:                                                                                                                                       History obtained from chart review  General ROS: negative for - chills, fatigue, fever, night sweats, weight gain or weight loss Psychological ROS: negative for - behavioral disorder, hallucinations, memory difficulties, mood swings or suicidal ideation Ophthalmic ROS: negative for - blurry vision, double vision, eye pain or loss of vision ENT ROS: negative for - epistaxis, nasal discharge, oral lesions, sore throat, tinnitus or vertigo Allergy and Immunology ROS: negative for - hives or itchy/watery eyes Hematological and Lymphatic ROS: negative for - bleeding problems, bruising or swollen lymph nodes Endocrine ROS: negative for - galactorrhea, hair pattern changes, polydipsia/polyuria or temperature intolerance Respiratory ROS: negative for - cough, hemoptysis, shortness of breath or wheezing Cardiovascular ROS: negative for - chest pain, dyspnea on exertion, edema or irregular heartbeat Gastrointestinal ROS: negative for - abdominal pain, diarrhea, hematemesis, nausea/vomiting or stool incontinence Genito-Urinary ROS: negative for - dysuria, hematuria, incontinence or urinary frequency/urgency Musculoskeletal ROS: negative for - joint swelling or muscular weakness Neurological ROS: as noted in HPI Dermatological ROS: negative for rash and skin lesion changes   Blood pressure 115/72, pulse 104, temperature 102 F (38.9 C),  temperature source Rectal, resp. rate 16, weight 77.111 kg (170 lb), SpO2 98 %.   Neurologic Examination:                                                                                                      HEENT-  Normocephalic, no lesions, without obvious abnormality.  Normal external eye and conjunctiva.  Normal TM's bilaterally.  Normal auditory canals and external ears. Normal external nose, mucus membranes and septum.  Normal pharynx. Cardiovascular- regular rate and rhythm, S1, S2 normal, no murmur, click, rub or gallop, pulses palpable throughout   Lungs- chest clear, no wheezing, rales, normal symmetric air entry, Heart exam - S1, S2 normal, no murmur, no gallop, rate regular Abdomen- soft, non-tender; bowel sounds normal; no masses,  no organomegaly   Neurological Examination Mental Status: Awake with eyes open Non verbal Does not track Blinks to threat Pupils equal and reactive 23mm Moans Arms are contracted Legs are contracted         Lab Results: Basic Metabolic Panel:  Recent Labs Lab 05/25/15 1016 05/25/15 1830  NA 140  --  K 4.6  --   CL 99*  --   CO2 25  --   GLUCOSE 100*  --   BUN 11  --   CREATININE 0.75 0.60*  CALCIUM 9.8  --     Liver Function Tests:  Recent Labs Lab 05/25/15 1016  AST 23  ALT 24  ALKPHOS 89  BILITOT 0.4  PROT 7.5  ALBUMIN 4.2   No results for input(s): LIPASE, AMYLASE in the last 168 hours. No results for input(s): AMMONIA in the last 168 hours.  CBC:  Recent Labs Lab 05/25/15 1016 05/25/15 1830  WBC 22.6* 22.8*  NEUTROABS 20.3*  --   HGB 14.0 12.3*  HCT 42.9 36.9*  MCV 93.9 93.9  PLT 238 207    Cardiac Enzymes: No results for input(s): CKTOTAL, CKMB, CKMBINDEX, TROPONINI in the last 168 hours.  Lipid Panel: No results for input(s): CHOL, TRIG, HDL, CHOLHDL, VLDL, LDLCALC in the last 168 hours.  CBG: No results for input(s): GLUCAP in the last 168 hours.  Microbiology: Results for orders placed  or performed during the hospital encounter of 03/03/15  Urine culture     Status: None   Collection Time: 03/03/15 11:14 AM  Result Value Ref Range Status   Specimen Description URINE, CATHETERIZED  Final   Special Requests Normal  Final   Culture   Final    MULTIPLE SPECIES PRESENT, SUGGEST RECOLLECTION Performed at [facility]    Report Status 03/04/2015 FINAL  Final  MRSA PCR Screening     Status: None   Collection Time: 03/04/15  8:18 AM  Result Value Ref Range Status   MRSA by PCR NEGATIVE NEGATIVE Final    Comment:        The GeneXpert MRSA Assay (FDA approved for NASAL specimens only), is one component of a comprehensive MRSA colonization surveillance program. It is not intended to diagnose MRSA infection nor to guide or monitor treatment for MRSA infections.     Coagulation Studies: No results for input(s): LABPROT, INR in the last 72 hours.  Imaging: Ct Head Wo Contrast  05/25/2015  CLINICAL DATA:  Altered mental status. End-stage multiple sclerosis. EXAM: CT HEAD WITHOUT CONTRAST TECHNIQUE: Contiguous axial images were obtained from the base of the skull through the vertex without intravenous contrast. COMPARISON:  03/12/2011 FINDINGS: Skull and Sinuses:Negative for fracture or destructive process. The visualized mastoids, middle ears, and imaged paranasal sinuses are clear. 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 Despite interval development the arthropathy has chronic features and no surrounding soft tissue inflammation. Visualized orbits: Dysconjugate gaze.  No acute finding. Brain: Severe atrophy with ventriculomegaly. There is confluent cerebral white matter low density with markedly thinned white matter. No signs of acute infarct or hemorrhage. 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 No acute intracranial finding. Electronically Signed   By: Monte Fantasia M.D.   On:  05/25/2015 14:57   Dg Chest Port 1 View  05/25/2015  CLINICAL DATA:  Fevers EXAM: PORTABLE CHEST 1 VIEW COMPARISON:  03/03/2015 FINDINGS: Cardiac shadow is within normal limits. The lungs are well aerated bilaterally. No focal infiltrate or sizable effusion is seen. No acute bony abnormality is noted. IMPRESSION: No active disease. Electronically Signed   By: Inez Catalina M.D.   On: 05/25/2015 11:32        Assessment/Plan: 40 y.o. man with PMHx significant for end-stage multiple sclerosis, quadriplegia & quadriparesis, dementia d/t MS, aspiration PNA,  recurrent UTI, and seizures who presents to the ED for altered mental status and fever. Has poor baseline, non verbal. Last night had some twitching of his lips noticed by his wife. This morning she noticed that he was lying in his own emesis as well as having had a bowel movement in the bed.In the ED, patient was noted to be febrile to 102 and tachycardic. He has chronic leukocytosis but his WBC was elevated above baseline to 22.6. He had a lactic acidosis of 3.07 which has come down to 2.17 with IV fluid resuscitation. He was started on Vanc and Zosyn in the ED for possible sepsis.   - EEG - Continue increased dose of Keppra 1.5g BID - Consider LP for meningitis if septic workup is negative

## 2015-05-25 NOTE — ED Provider Notes (Signed)
CSN: [id number]     Arrival date & time 05/25/15  1001 History   First MD Initiated Contact with Patient 05/25/15 1004     Chief Complaint  Patient presents with  . Fever  . Altered Mental Status     HPI   Level V Caveat due to mental status  Terry Palmer is an 40 y.o. male with history of endstage MS, quadriparesis, dementia who is BIB EMS for evaluation of fever and AMS. He apparently lives at home with his wife and caregiver who told EMS that pt can blink responses at baseline but otherwise is immobile and nonverbal. EMR review reveals pt last saw neurologist Dr. Jannifer [name] in 12/2014 who had concerns that pt also with MS-induced dementia and possibly blindness and that pt's family did not understand the end-stage prognosis of pt's multiple sclerosis.  Past Medical History  Diagnosis Date  . MS (multiple sclerosis) ([place])   . Quadriparesis (muscle weakness) ([place]) 03/12/2011  . Childhood asthma   . Depression   . Neuromuscular disorder ([place])     Quadraperesis  . Recurrent UTI   . Dysphagia   . Bladder calculi   . Neurogenic bladder   . Shortness of breath   . Recurrent upper respiratory infection (URI)   . Normocytic anemia 05/28/2011  . Aspiration pneumonia ([place]) 01/20/2013  . 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 Quadriplegia and quadriparesis ([place]) 12/24/2014  . 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 Lumbar puncture  10/12/2002  . Gastrostomy  04/16/2011    Procedure: GASTROSTOMY;  Surgeon: Zenovia Jarred, MD;  Location: [facility] OR;  Service: General;  Laterality: N/A;  Open G-Tube placement   Family History  Problem Relation Age of Onset  . Asthma Mother    Social History  Substance Use Topics  . Smoking status: Former Smoker    Types: Cigarettes    Quit date: 02/02/1999  . Smokeless tobacco: Former Systems developer  . Alcohol Use: No    Review of Systems  Unable to perform ROS: Patient unresponsive       Allergies  Review of patient's allergies indicates no known allergies.  Home Medications   Prior to Admission medications   Medication Sig Start Date End Date Taking? Authorizing Provider  acetaminophen (TYLENOL) 500 MG tablet Place 1,000 mg into feeding tube 2 (two) times daily.     Historical Provider, MD  acetaminophen (TYLENOL) 500 MG tablet Take 1 tablet (500 mg total) by mouth every 8 (eight) hours as needed for fever (or pain). 03/05/15   Debbe Odea, MD  albuterol (PROVENTIL) (2.5 MG/3ML) 0.083% nebulizer solution Take 2.5 mg by nebulization every 6 (six) hours as needed for wheezing or shortness of breath.    Historical Provider, MD  antiseptic oral rinse (CPC / CETYLPYRIDINIUM CHLORIDE 0.05%) 0.05 % LIQD solution 7 mLs by Mouth Rinse route 2 times daily at 12 noon and 4 pm. Patient not taking: Reported on 12/24/2014 11/02/14   Robbie Lis, MD  ascorbic acid (VITAMIN C) 500 MG/5ML syrup Place 5 mLs (500 mg total) into feeding tube 2 (two) times daily. Patient not taking: Reported on 03/03/2015 01/24/13   Bonnielee Haff, MD  baclofen (LIORESAL) 20 MG tablet Place 1 tablet (20 mg total) into feeding tube 3 (three) times daily. Patient taking differently: Place 20 mg into feeding tube 4 (four) times daily.  01/24/13   Bonnielee Haff, MD  bisacodyl (DULCOLAX) 10 MG suppository Place 10  mg rectally once as needed for moderate constipation.     Historical Provider, MD  buPROPion (WELLBUTRIN) 75 MG tablet Place 1 tablet (75 mg total) into feeding tube 3 (three) times daily. 01/24/13   Bonnielee Haff, MD  cefdinir (OMNICEF) 250 MG/5ML suspension Take 6 mLs (300 mg total) by mouth 2 (two) times daily. 03/05/15   Debbe Odea, MD  cholecalciferol (VITAMIN D) 1000 UNITS tablet Take 1 tablet (1,000 Units total) by mouth 2 (two) times daily. Patient taking differently: Take 5,000 Units by mouth daily.  01/24/13   Bonnielee Haff, MD  Cranberry Juice Powder 425 MG CAPS 1 capsule (425 mg total)  by PEG Tube route 2 (two) times daily. 01/24/13   Bonnielee Haff, MD  dantrolene (DANTRIUM) 50 MG capsule Take 200 mg by mouth at bedtime.     Historical Provider, MD  Diaper Rash Products (DESITIN EX) Apply 1 application topically as needed (may apply to minor skin breakdown until healed).    Historical Provider, MD  diazepam (VALIUM) 5 MG tablet Take 5 mg by mouth daily as needed for anxiety.     Historical Provider, MD  fexofenadine (ALLEGRA) 180 MG tablet Place 180 mg into feeding tube daily.    Historical Provider, MD  Garlic Oil [id number] MG CAPS Take 1,000 mg by mouth daily with breakfast.    Historical Provider, MD  guaiFENesin-dextromethorphan (ROBITUSSIN DM) 100-10 MG/5ML syrup Take 10 mLs by mouth every 4 (four) hours as needed for cough.    Historical Provider, MD  HYDROcodone-acetaminophen (NORCO) 7.5-325 MG per tablet Place 1 tablet into feeding tube daily as needed (pain).     Historical Provider, MD  ipratropium-albuterol (DUONEB) 0.5-2.5 (3) MG/3ML SOLN Take 3 mLs by nebulization 2 (two) times daily.    Historical Provider, MD  Lactobacillus (ACIDOPHILUS PO) Place 1 capsule into feeding tube daily.     Historical Provider, MD  levETIRAcetam (KEPPRA) 500 MG tablet Take 1,000 mg by mouth 2 (two) times daily.    Historical Provider, MD  magnesium hydroxide (MILK OF MAGNESIA) 400 MG/5ML suspension Take 30 mLs by mouth every 12 (twelve) hours as needed for mild constipation.    Historical Provider, MD  Multiple Vitamin (MULTIVITAMIN WITH MINERALS) TABS tablet Place 1 tablet into feeding tube daily. 01/24/13   Bonnielee Haff, MD  NALTREXONE HCL PO Give 4.5 mg by tube at bedtime.    Historical Provider, MD  neomycin-bacitracin-polymyxin (NEOSPORIN) [phone] ointment Apply 1 application topically daily as needed (for toes).    Historical Provider, MD  Nutritional Supplements (FEEDING SUPPLEMENT, OSMOLITE 1.5 CAL,) LIQD Place 1,000 mLs into feeding tube continuous. Patient taking differently:  Place 75 mL/hr into feeding tube See admin instructions. For 14 hours a day 11/02/14   Robbie Lis, MD  nystatin (MYCOSTATIN/NYSTOP) 100000 UNIT/GM POWD Apply 1 g topically 3 (three) times daily. Patient taking differently: Apply 1 g topically daily. Applies to groin 01/24/13   Bonnielee Haff, MD  omeprazole (PRILOSEC) 2 mg/mL SUSP Place 20 mLs (40 mg total) into feeding tube daily. 01/24/13   Bonnielee Haff, MD  ondansetron (ZOFRAN) 4 MG tablet Place 1 tablet (4 mg total) into feeding tube every 6 (six) hours as needed for nausea. Patient not taking: Reported on 12/24/2014 11/02/14   Robbie Lis, MD  pantoprazole sodium (PROTONIX) 40 mg/20 mL PACK Place 20 mLs (40 mg total) into feeding tube daily. Patient not taking: Reported on 12/24/2014 11/02/14   Robbie Lis, MD  polyethylene glycol [facility] / Floria Raveling) packet  Place 17 g into feeding tube daily. 01/24/13   Bonnielee Haff, MD  polyvinyl alcohol (LIQUIFILM TEARS) 1.4 % ophthalmic solution Place 1 drop into both eyes 3 (three) times daily. Patient taking differently: Place 1 drop into both eyes 2 (two) times daily.  01/24/13   Bonnielee Haff, MD  predniSONE 5 MG/ML concentrated solution Place 2 mLs (10 mg total) into feeding tube daily with breakfast. Patient taking differently: Place 25 mg into feeding tube daily with breakfast.  11/02/14   Robbie Lis, MD  Saline (SIMPLY SALINE) 0.9 % AERS Place 1 spray into both nostrils as needed (for nasal conjestion).    Historical Provider, MD  senna (SENOKOT) 8.6 MG TABS tablet Take 2 tablets by mouth daily.    Historical Provider, MD  sodium phosphate (FLEET) enema Place 1 enema rectally once as needed (for constipation). follow package directions    Historical Provider, MD  tiZANidine (ZANAFLEX) 2 MG tablet Take 1 tablet (2 mg total) by mouth every 6 (six) hours as needed for muscle spasms. 03/30/15   Kathrynn Ducking, MD  vitamin C (ASCORBIC ACID) 500 MG tablet Place 500 mg into feeding tube 2 (two)  times daily.    Historical Provider, MD  Water For Irrigation, Sterile (FREE WATER) SOLN Place 240 mLs into feeding tube every 4 (four) hours. 01/24/13   Bonnielee Haff, MD   BP 112/79 mmHg  Pulse 116  Temp(Src) 102.2 F (39 C) (Rectal)  Resp 17  Wt 77.111 kg  SpO2 100% Physical Exam  Constitutional:  Pt laying in bed, not responding to stimuli. Eyes open.  HENT:  Right Ear: External ear normal.  Left Ear: External ear normal.  Nose: Nose normal.  Mouth/Throat: Oropharynx is clear and moist. No oropharyngeal exudate.  Bilateral cerumen impaction No oral lesions  Eyes: Conjunctivae are normal. Pupils are equal, round, and reactive to light.  Will not blink in response to commands  Cardiovascular: Regular rhythm, normal heart sounds and intact distal pulses.  Tachycardia present.   Pulmonary/Chest: Effort normal and breath sounds normal. No respiratory distress. He has no wheezes.  Abdominal: Soft. Bowel sounds are normal. He exhibits no distension. There is no tenderness.  g tube in place. No surrounding erythema or edema. Suprapubic catheter in place. No surrounding erythema or edema.  Musculoskeletal: He exhibits no edema.  Lymphadenopathy:    He has no cervical adenopathy.  Neurological:  All four extremities in flexion contracture Will not respond to verbal stimuli  Skin: Skin is warm and dry. No rash noted.  Nursing note and vitals reviewed.   ED Course  Procedures (including critical care time)  CRITICAL CARE Performed by: Delrae Rend   Total critical care time: 30 minutes  Critical care time was exclusive of separately billable procedures and treating other patients.  Critical care was necessary to treat or prevent imminent or life-threatening deterioration.  Critical care was time spent personally by me on the following activities: development of treatment plan with patient and/or surrogate as well as nursing, discussions with consultants, evaluation of  patient's response to treatment, examination of patient, obtaining history from patient or surrogate, ordering and performing treatments and interventions, ordering and review of laboratory studies, ordering and review of radiographic studies, pulse oximetry and re-evaluation of patient's condition.   Labs Review Labs Reviewed  COMPREHENSIVE METABOLIC PANEL - Abnormal; Notable for the following:    Chloride 99 (*)    Glucose, Bld 100 (*)    Anion gap 16 (*)  All other components within normal limits  CBC WITH DIFFERENTIAL/PLATELET - Abnormal; Notable for the following:    WBC 22.6 (*)    Neutro Abs 20.3 (*)    Lymphs Abs 0.6 (*)    Monocytes Absolute 1.6 (*)    All other components within normal limits  URINALYSIS, ROUTINE W REFLEX MICROSCOPIC (NOT AT [facility]) - Abnormal; Notable for the following:    APPearance TURBID (*)    pH 8.5 (*)    Protein, ur 30 (*)    Leukocytes, UA LARGE (*)    All other components within normal limits  URINE MICROSCOPIC-ADD ON - Abnormal; Notable for the following:    Squamous Epithelial / LPF 0-5 (*)    Bacteria, UA MANY (*)    Crystals TRIPLE PHOSPHATE CRYSTALS (*)    All other components within normal limits  I-STAT CG4 LACTIC ACID, ED - Abnormal; Notable for the following:    Lactic Acid, Venous 3.07 (*)    All other components within normal limits  CULTURE, BLOOD (ROUTINE X 2)  CULTURE, BLOOD (ROUTINE X 2)  URINE CULTURE    Imaging Review Dg Chest Port 1 View  05/25/2015  CLINICAL DATA:  Fevers EXAM: PORTABLE CHEST 1 VIEW COMPARISON:  03/03/2015 FINDINGS: Cardiac shadow is within normal limits. The lungs are well aerated bilaterally. No focal infiltrate or sizable effusion is seen. No acute bony abnormality is noted. IMPRESSION: No active disease. Electronically Signed   By: Inez Catalina M.D.   On: 05/25/2015 11:32   I have personally reviewed and evaluated these images and lab results as part of my medical decision-making.   EKG  Interpretation None      MDM   Final diagnoses:  Sepsis, due to unspecified organism [facility])    Will start sepsis workup. After initial assessment pt's wife and caretaker are present in room. Per pt's wife, at baseline pt will blink once for yes and just stare of into space if the answer is "no." he apparently was in his usual state of health until last night when they noticed his lips were twitching and then he stopped responding to stimuli as he typically does. This AM he was found surrounded by NBNB emesis in bed. His caretaker notes his urine has been unremarkable though he has required increased catheter flushings lately due to clots. Per pt's wife, pt is FULL CODE.  Lactic acid 22.6. Pt febrile to 102F and tachycardic. White count 22.6. Will call code sepsis. Fluid bolus already running. Will order vanc/zosyn.   Slight anion gap of 16. Kidney function normal. Urine with WBC/bacteria but nitrite negative and given longstanding suprapubic catheter I do expect chronic bacturia. Cultures sent. CXR shows no acute abnormality. HR is improving. BP stable. Will call medicine for sepsis admission.  I spoke to Dr. Redmond Pulling of [facility]. They will come see and evaluate patient and admit pt.   Anne Ng, PA-C 05/25/15 1454

## 2015-05-25 NOTE — Discharge Planning (Signed)
ED Case Management Note  Subjective/Objective:   40 y.o. male with history of endstage MS, quadriparesis, dementia who is BIB EMS for evaluation of fever and AMS. He apparently lives at home with his wife and caregiver who told EMS that pt can blink responses at baseline but otherwise is immobile and nonverbal.    Expected Discharge Date:          05/27/15       Action/Plan:  Follow for disposition needs.

## 2015-05-25 NOTE — ED Notes (Signed)
Pharmacy asked to verify tylenol order.

## 2015-05-25 NOTE — Progress Notes (Addendum)
Pharmacy Antibiotic Note  Terry Palmer is a 40 y.o. male admitted on 05/25/2015 with sepsis.  Pharmacy has been consulted for vancomycin and Zosyn dosing.  Admitted with AMS and Tmax of 102.69F in ED. WBC 22.6, lactate 3.07, tachycardic to 100s. Of note, pt is quadriplegic from [place]. Cr 0.7.   Plan: Vancomycin 2000 mg IV x 1 loading dose Vancomycin 1000 mg IV q12h starting at 2300 Zosyn 3.375 g IV q8h EI F/u renal function and VT at steady state Monitor CBC, fever curve, clinical s/sx improvement F/u blood cx  Weight: 170 lb (77.111 kg)  Temp (24hrs), Avg:102.2 F (39 C), Min:102.2 F (39 C), Max:102.2 F (39 C)   Recent Labs Lab 05/25/15 1016 05/25/15 1023  WBC 22.6*  --   CREATININE 0.75  --   LATICACIDVEN  --  3.07*    CrCl cannot be calculated (Patient has no serum creatinine result on file.).    No Known Allergies  Antimicrobials this admission: 5/16 vancomycin >>  5/16 Zosyn >>   Dose adjustments this admission: n/a  Microbiology results: 5/16 BCx:  5/16 UCx:   Thank you for allowing pharmacy to be a part of this patient's care.  Wynelle Fanny 05/25/2015 10:43 AM

## 2015-05-25 NOTE — H&P (Signed)
Date: 05/25/2015               Patient Name:  Terry Palmer MRN: [id number]  DOB: 01-29-1975 Age / Sex: 40 y.o., male   PCP: [name], MD         Medical Service: Internal Medicine Teaching Service         Attending Physician: Dr. Fredia Sorrow, MD    First Contact: Dr. Jule Ser Pager: [phone]  Second Contact: Dr. Dellia Nims Pager: [phone]       After Hours (After 5p/  First Contact Pager: [phone]  weekends / holidays): Second Contact Pager: [phone]   Chief Complaint: Fever, AMS  History of Present Illness: Terry Palmer is a 40 y.o. man with PMHx significant for end-stage multiple sclerosis, quadriplegia & quadriparesis, dementia d/t MS, aspiration PNA, recurrent UTI, and seizures who presents to the ED for altered mental status and fever.  Patient lives at home with wife and has a caregiver.  Much of the history was obtained from wife and mother as the patient is non-verbal.  At his baseline, family reports that patient will blink his eyes once if answer to question is "yes" and will stare off if the answer is "no".  He also will reportedly smile but does not move his eyes.  His mother reports that she feels within the last 6 weeks or so his eyes have become more deviated.  He has no movement of his extremities and has contractures of arms and legs.  Reportedly, last night he had some twitching of his lips noticed by his wife.  This morning she noticed that he was lying in his own emesis as well as having had a bowel movement in the bed.  She does not think that he had any tongue-biting.  She also thought he looked lethargic and was not communicating as he normally does. She checked an axillary temp and it was 100.8.  She feels that his urine has looked a dark yellow recently but has not noticed any pus surrounding his suprapubic catheter site.  She says he has a special mattress and is turned frequently and has no current skin issues.  She does report that he has  been belching more frequently over the past few days.    In the ED, patient was noted to be febrile to 102 and tachycardic.  He has chronic leukocytosis but his WBC was elevated above baseline to 22.6.  He had a lactic acidosis of 3.07 which has come down to 2.17 with IV fluid resuscitation.  He was started on Vanc and Zosyn in the ED.  CXR with no infiltrate.  Urinalysis with turbid appearance, large leukocytes, negative nitrites.  Microscopy with many bacteria.  Urine culture sent.  Blood cultures also sent.  Meds: Current Facility-Administered Medications  Medication Dose Route Frequency Provider Last Rate Last Dose  . piperacillin-tazobactam (ZOSYN) IVPB 3.375 g  3.375 g Intravenous Q8H Wynelle Fanny, [facility]      . vancomycin (VANCOCIN) IVPB 1000 mg/200 mL premix  1,000 mg Intravenous Q12H Wynelle Fanny, [facility]       Current Outpatient Prescriptions  Medication Sig Dispense Refill  . acetaminophen (TYLENOL) 500 MG tablet Place 1,000 mg into feeding tube 2 (two) times daily.     Marland Kitchen acetaminophen (TYLENOL) 500 MG tablet Take 1 tablet (500 mg total) by mouth every 8 (eight) hours as needed for fever (or pain). 30 tablet 0  . albuterol (PROVENTIL) (2.5  MG/3ML) 0.083% nebulizer solution Take 2.5 mg by nebulization every 6 (six) hours as needed for wheezing or shortness of breath.    . baclofen (LIORESAL) 20 MG tablet Place 1 tablet (20 mg total) into feeding tube 3 (three) times daily. (Patient taking differently: Place 20 mg into feeding tube 4 (four) times daily. ) 90 each 1  . bisacodyl (DULCOLAX) 10 MG suppository Place 10 mg rectally once as needed for moderate constipation.     Marland Kitchen buPROPion (WELLBUTRIN) 75 MG tablet Place 1 tablet (75 mg total) into feeding tube 3 (three) times daily. 90 tablet 1  . cholecalciferol (VITAMIN D) 1000 UNITS tablet Take 1 tablet (1,000 Units total) by mouth 2 (two) times daily. (Patient taking differently: Give 5,000 Units by tube daily. ) 60 tablet 1  . Cranberry  Juice Powder 425 MG CAPS 1 capsule (425 mg total) by PEG Tube route 2 (two) times daily. 90 each 0  . dantrolene (DANTRIUM) 50 MG capsule Give 200 mg by tube at bedtime.     . Diaper Rash Products (DESITIN EX) Apply 1 application topically as needed (may apply to minor skin breakdown until healed).    . diazepam (VALIUM) 5 MG tablet Take 5 mg by mouth daily as needed for anxiety.     . fexofenadine (ALLEGRA) 180 MG tablet Place 180 mg into feeding tube daily.    . Garlic Oil 123XX123 MG CAPS Give 1,000 mg by tube daily with breakfast.     . HYDROcodone-acetaminophen (NORCO) 7.5-325 MG per tablet Place 1 tablet into feeding tube daily as needed (pain).     Marland Kitchen ipratropium-albuterol (DUONEB) 0.5-2.5 (3) MG/3ML SOLN Take 3 mLs by nebulization 2 (two) times daily.    . Lactobacillus (ACIDOPHILUS PO) Place 1 capsule into feeding tube daily.     Marland Kitchen levETIRAcetam (KEPPRA) 500 MG tablet Take 1,000 mg by mouth 2 (two) times daily.    . magnesium hydroxide (MILK OF MAGNESIA) 400 MG/5ML suspension Take 30 mLs by mouth every 12 (twelve) hours as needed for mild constipation.    . Multiple Vitamin (MULTIVITAMIN WITH MINERALS) TABS tablet Place 1 tablet into feeding tube daily. 30 tablet 0  . NALTREXONE HCL PO Give 4.5 mg by tube at bedtime.    Marland Kitchen neomycin-bacitracin-polymyxin (NEOSPORIN) [phone] ointment Apply 1 application topically daily as needed (for toes).    . Nutritional Supplements (FEEDING SUPPLEMENT, OSMOLITE 1.5 CAL,) LIQD Place 1,000 mLs into feeding tube continuous. (Patient taking differently: Place 80 mL/hr into feeding tube See admin instructions. For 14 hours a day) 1000 mL 0  . nystatin (MYCOSTATIN/NYSTOP) 100000 UNIT/GM POWD Apply 1 g topically 3 (three) times daily. (Patient taking differently: Apply 1 g topically daily. Applies to groin) 15 g 0  . omeprazole (PRILOSEC) 2 mg/mL SUSP Place 20 mLs (40 mg total) into feeding tube daily. 600 mL 1  . polyethylene glycol (MIRALAX / GLYCOLAX) packet  Place 17 g into feeding tube daily. 30 each 1  . polyvinyl alcohol (LIQUIFILM TEARS) 1.4 % ophthalmic solution Place 1 drop into both eyes 3 (three) times daily. (Patient taking differently: Place 1 drop into both eyes 2 (two) times daily. ) 15 mL 0  . predniSONE 5 MG/ML concentrated solution Place 2 mLs (10 mg total) into feeding tube daily with breakfast. (Patient taking differently: Place 25 mg into feeding tube daily with breakfast. ) 10 mL 0  . Saline (SIMPLY SALINE) 0.9 % AERS Place 1 spray into both nostrils as needed (for nasal conjestion).    Marland Kitchen  senna (SENOKOT) 8.6 MG TABS tablet Take 2 tablets by mouth daily.    . sodium phosphate (FLEET) enema Place 1 enema rectally once as needed (for constipation). follow package directions    . tiZANidine (ZANAFLEX) 2 MG tablet Take 1 tablet (2 mg total) by mouth every 6 (six) hours as needed for muscle spasms. 120 tablet 3  . vitamin C (ASCORBIC ACID) 500 MG tablet Place 500 mg into feeding tube 2 (two) times daily.    . Water For Irrigation, Sterile (FREE WATER) SOLN Place 240 mLs into feeding tube every 4 (four) hours. 1000 mL 1  . ascorbic acid (VITAMIN C) 500 MG/5ML syrup Place 5 mLs (500 mg total) into feeding tube 2 (two) times daily. (Patient not taking: Reported on 05/25/2015) 473 mL 1  . ondansetron (ZOFRAN) 4 MG tablet Place 1 tablet (4 mg total) into feeding tube every 6 (six) hours as needed for nausea. (Patient not taking: Reported on 12/24/2014) 20 tablet 0  . pantoprazole sodium (PROTONIX) 40 mg/20 mL PACK Place 20 mLs (40 mg total) into feeding tube daily. (Patient not taking: Reported on 12/24/2014) 30 each 0    Allergies: Allergies as of 05/25/2015  . (No Known Allergies)   Past Medical History  Diagnosis Date  . MS (multiple sclerosis) ([place])   . Quadriparesis (muscle weakness) ([place]) 03/12/2011  . Childhood asthma   . Depression   . Neuromuscular disorder ([place])     Quadraperesis  . Recurrent UTI   . Dysphagia   . Bladder  calculi   . Neurogenic bladder   . Shortness of breath   . Recurrent upper respiratory infection (URI)   . Normocytic anemia 05/28/2011  . Aspiration pneumonia ([place]) 01/20/2013  . 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 Quadriplegia and quadriparesis ([place]) 12/24/2014  . 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 Lumbar puncture  10/12/2002  . Gastrostomy  04/16/2011    Procedure: GASTROSTOMY;  Surgeon: Zenovia Jarred, MD;  Location: [facility] OR;  Service: General;  Laterality: N/A;  Open G-Tube placement   Family History  Problem Relation Age of Onset  . Asthma Mother    Social History   Social History  . Marital Status: Married    Spouse Name: N/A  . Number of Children: 2  . Years of Education: N/A   Occupational History  . Not on file.   Social History Main Topics  . Smoking status: Former Smoker    Types: Cigarettes    Quit date: 02/02/1999  . Smokeless tobacco: Former Systems developer  . Alcohol Use: No  . Drug Use: Not on file     Comment: Former use  . Sexual Activity: No   Other Topics Concern  . Not on file   Social History Narrative   The patient currently lives at home with his wife and kids, who help him with his ADL's.  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  The patient currently has [facility], and is in the process of reapplying for Medicaid (12/2010).    Zayshawn Rankin (cell) [phone]; (home) [phone],     Elin Zakowski (cell) [phone] (home) [phone]    Review of Systems: Review of systems not obtained due to patient factors.  Physical Exam: Blood pressure 121/76, pulse 95, temperature 102.7 F (39.3 C), temperature source Rectal, resp. rate 22, weight 170 lb (77.111 kg), SpO2 100 %.  General: lying in bed, no responding to questions, eyes open HEENT: eyes open, not tracking with his eyes, not  blinking his eyes Cardiac: RRR, no rubs, murmurs or gallops Pulm: effort normal, CTAB in anterior fields Abd: G-tube in place with no surrounding erythema.   GU: suprapubic catheter in place with no surrounding erythema or pus Ext: flexion contractures of all 4 extremities Skin: + intertrigo Neuro: not responsive to verbal stimuli   Lab results: Basic Metabolic Panel:  Recent Labs  05/25/15 1016  NA 140  K 4.6  CL 99*  CO2 25  GLUCOSE 100*  BUN 11  CREATININE 0.75  CALCIUM 9.8   Liver Function Tests:  Recent Labs  05/25/15 1016  AST 23  ALT 24  ALKPHOS 89  BILITOT 0.4  PROT 7.5  ALBUMIN 4.2   CBC:  Recent Labs  05/25/15 1016  WBC 22.6*  NEUTROABS 20.3*  HGB 14.0  HCT 42.9  MCV 93.9  PLT 238   Urine Drug Screen: Drugs of Abuse     Component Value Date/Time   LABOPIA NONE DETECTED 10/20/2014 1817   COCAINSCRNUR NONE DETECTED 10/20/2014 1817   LABBENZ POSITIVE* 10/20/2014 1817   AMPHETMU NONE DETECTED 10/20/2014 1817   THCU NONE DETECTED 10/20/2014 1817   LABBARB NONE DETECTED 10/20/2014 1817    Alcohol Level: No results for input(s): ETH in the last 72 hours. Urinalysis:  Recent Labs  05/25/15 1019  COLORURINE YELLOW  LABSPEC 1.025  PHURINE 8.5*  GLUCOSEU NEGATIVE  HGBUR NEGATIVE  BILIRUBINUR NEGATIVE  KETONESUR NEGATIVE  PROTEINUR 30*  NITRITE NEGATIVE  LEUKOCYTESUR LARGE*    Imaging results:  Ct Head Wo Contrast  05/25/2015  CLINICAL DATA:  Altered mental status. End-stage multiple sclerosis. EXAM: CT HEAD WITHOUT CONTRAST TECHNIQUE: Contiguous axial images were obtained from the base of the skull through the vertex without intravenous contrast. COMPARISON:  03/12/2011 FINDINGS: Skull and Sinuses:Negative for fracture or destructive process. The visualized mastoids, middle ears, and imaged paranasal sinuses are clear. 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 Despite interval development the arthropathy has chronic features  and no surrounding soft tissue inflammation. Visualized orbits: Dysconjugate gaze.  No acute finding. Brain: Severe atrophy with ventriculomegaly. There is confluent cerebral white matter low density with markedly thinned white matter. No signs of acute infarct or hemorrhage. 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 No acute intracranial finding. Electronically Signed   By: Monte Fantasia M.D.   On: 05/25/2015 14:57   Dg Chest Port 1 View  05/25/2015  CLINICAL DATA:  Fevers EXAM: PORTABLE CHEST 1 VIEW COMPARISON:  03/03/2015 FINDINGS: Cardiac shadow is within normal limits. The lungs are well aerated bilaterally. No focal infiltrate or sizable effusion is seen. No acute bony abnormality is noted. IMPRESSION: No active disease. Electronically Signed   By: Inez Catalina M.D.   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  Concern for sepsis given his presentation, however, unclear source.  Urinary tract infections have caused him to be admitted in the past and he has had several cultures grow various organisms with  resistance to different antibiotics.  His urinalysis here is concerning but it is difficult to distinguish a true UTI from colonization as he is unable to tell us about symptoms.  He also has history of aspiration pneumonia but CXR looks clear on admission.  His head CT shows history of end-stage multiple sclerosis with severe brain atrophy that has markedly progressed since 2013. No acute intracranial finding.  Also concerning for seizure given lip twitching noticed last  night.  He also has history of malignant hyperthermia and is on chronic prednisone which could explain in part his fever and leukocytosis.  Per recent discharge summary and neurology office visit notes, it is somewhat unclear how decompensated he is from his baseline mental status. - follow up urine and blood cultures - continue vancomycin and piperacillin-tazobactam for now - follow WBC, fever curve - repeat CXR if concern for aspiration to see if an infiltrate has developed - IV fluids - Keppra - EEG - neurology consulted - consider LP if concern for meningitis  Seizure History: on Keppra 1gm BID at home - continue Reynolds - neurology consulted  End-Stage MS: recent neurology notes state no indication for disease modifying agents.  Neurology notes also reflect patient wife and caretaker may have an unrealistic interpretation of his current abilities and belief he may possibly be blind. - continue baclofen, dantrolene, tizanidine  Failure to Thrive - tube feeds per nutrition  Diet: NPO  DVT PPx: Lovenox  Code: FULL   Dispo: Disposition is deferred at this time, awaiting improvement of current medical problems. Anticipated discharge in approximately 2-3 day(s).   The patient does have a current PCP ([name], MD) and does not need an [facility] hospital follow-up appointment after discharge.  The patient does have transportation limitations that hinder transportation to clinic appointments.  Signed: Jule Ser, DO 05/25/2015, 4:22 PM

## 2015-05-25 NOTE — ED Notes (Signed)
Attempted report x1. 

## 2015-05-25 NOTE — ED Notes (Addendum)
Pt. BIB [facility] for evaluation of fever and altered mental status. Pt. Is quadriplegic from [place], pt. Lives at home with wife and caregiver. Wife reports pt. Typically is nonverbal but able to communicate through commands such as blinking. Pt. Is not communicating at this time. Pt. Pupils are nonreactive. Pt. Has suprapubic catheter.

## 2015-05-25 NOTE — Progress Notes (Signed)
Pharmacy Code Sepsis Protocol  Time of code sepsis page: [id number] []  Antibiotics delivered at 1050 []  Antibiotics administered prior to code at  (if checked, omit next 2 questions)  Were antibiotics ordered at the time of the code sepsis page? Yes Was it required to contact the physician? [x]  Physician not contacted []  Physician contacted to order antibiotics for code sepsis []  Physician contacted to recommend changing antibiotics  Pharmacy consulted for: vanc/Zosyn  Anti-infectives    Start     Dose/Rate Route Frequency Ordered Stop   05/25/15 2300  vancomycin (VANCOCIN) IVPB 1000 mg/200 mL premix     1,000 mg 200 mL/hr over 60 Minutes Intravenous Every 12 hours 05/25/15 1104     05/25/15 2100  piperacillin-tazobactam (ZOSYN) IVPB 3.375 g     3.375 g 12.5 mL/hr over 240 Minutes Intravenous Every 8 hours 05/25/15 1104     05/25/15 1100  vancomycin (VANCOCIN) 2,000 mg in sodium chloride 0.9 % 500 mL IVPB     2,000 mg 250 mL/hr over 120 Minutes Intravenous  Once 05/25/15 1050     05/25/15 1100  piperacillin-tazobactam (ZOSYN) IVPB 3.375 g     3.375 g 100 mL/hr over 30 Minutes Intravenous  Once 05/25/15 1050          Nurse education provided: []  Minutes left to administer antibiotics to achieve 1 hour goal []  Correct order of antibiotic administration [x]  Antibiotic Y-site compatibilities      Wynelle Fanny, PharmD 05/25/2015, 11:07 AM

## 2015-05-26 ENCOUNTER — Inpatient Hospital Stay (HOSPITAL_COMMUNITY): Payer: Medicare HMO

## 2015-05-26 DIAGNOSIS — E43 Unspecified severe protein-calorie malnutrition: Secondary | ICD-10-CM

## 2015-05-26 DIAGNOSIS — R4182 Altered mental status, unspecified: Secondary | ICD-10-CM

## 2015-05-26 DIAGNOSIS — D72829 Elevated white blood cell count, unspecified: Secondary | ICD-10-CM

## 2015-05-26 DIAGNOSIS — R509 Fever, unspecified: Secondary | ICD-10-CM

## 2015-05-26 LAB — BLOOD CULTURE ID PANEL (REFLEXED)
ACINETOBACTER BAUMANNII: NOT DETECTED
CANDIDA ALBICANS: NOT DETECTED
CANDIDA GLABRATA: NOT DETECTED
CANDIDA TROPICALIS: NOT DETECTED
Candida krusei: NOT DETECTED
Candida parapsilosis: NOT DETECTED
Carbapenem resistance: NOT DETECTED
ENTEROBACTER CLOACAE COMPLEX: NOT DETECTED
ENTEROCOCCUS SPECIES: NOT DETECTED
ESCHERICHIA COLI: NOT DETECTED
Enterobacteriaceae species: NOT DETECTED
HAEMOPHILUS INFLUENZAE: NOT DETECTED
Klebsiella oxytoca: NOT DETECTED
Klebsiella pneumoniae: NOT DETECTED
LISTERIA MONOCYTOGENES: NOT DETECTED
METHICILLIN RESISTANCE: DETECTED — AB
NEISSERIA MENINGITIDIS: NOT DETECTED
PROTEUS SPECIES: NOT DETECTED
Pseudomonas aeruginosa: NOT DETECTED
SERRATIA MARCESCENS: NOT DETECTED
STAPHYLOCOCCUS AUREUS BCID: NOT DETECTED
STREPTOCOCCUS PYOGENES: NOT DETECTED
STREPTOCOCCUS SPECIES: NOT DETECTED
Staphylococcus species: DETECTED — AB
Streptococcus agalactiae: NOT DETECTED
Streptococcus pneumoniae: NOT DETECTED
VANCOMYCIN RESISTANCE: NOT DETECTED

## 2015-05-26 LAB — GLUCOSE, CAPILLARY
GLUCOSE-CAPILLARY: 96 mg/dL (ref 65–99)
Glucose-Capillary: 140 mg/dL — ABNORMAL HIGH (ref 65–99)
Glucose-Capillary: 153 mg/dL — ABNORMAL HIGH (ref 65–99)
Glucose-Capillary: 98 mg/dL (ref 65–99)

## 2015-05-26 LAB — COMPREHENSIVE METABOLIC PANEL
ALK PHOS: 68 U/L (ref 38–126)
ALT: 20 U/L (ref 17–63)
ANION GAP: 14 (ref 5–15)
AST: 25 U/L (ref 15–41)
Albumin: 3.3 g/dL — ABNORMAL LOW (ref 3.5–5.0)
BILIRUBIN TOTAL: 0.7 mg/dL (ref 0.3–1.2)
BUN: 6 mg/dL (ref 6–20)
CALCIUM: 8.7 mg/dL — AB (ref 8.9–10.3)
CO2: 19 mmol/L — ABNORMAL LOW (ref 22–32)
Chloride: 101 mmol/L (ref 101–111)
Creatinine, Ser: 0.56 mg/dL — ABNORMAL LOW (ref 0.61–1.24)
Glucose, Bld: 93 mg/dL (ref 65–99)
POTASSIUM: 3.8 mmol/L (ref 3.5–5.1)
Sodium: 134 mmol/L — ABNORMAL LOW (ref 135–145)
TOTAL PROTEIN: 6.2 g/dL — AB (ref 6.5–8.1)

## 2015-05-26 LAB — CBC
HEMATOCRIT: 36.4 % — AB (ref 39.0–52.0)
HEMOGLOBIN: 11.9 g/dL — AB (ref 13.0–17.0)
MCH: 31.2 pg (ref 26.0–34.0)
MCHC: 32.7 g/dL (ref 30.0–36.0)
MCV: 95.5 fL (ref 78.0–100.0)
Platelets: 164 10*3/uL (ref 150–400)
RBC: 3.81 MIL/uL — ABNORMAL LOW (ref 4.22–5.81)
RDW: 12.9 % (ref 11.5–15.5)
WBC: 17.2 10*3/uL — AB (ref 4.0–10.5)

## 2015-05-26 MED ORDER — SODIUM CHLORIDE 0.9 % IV SOLN
INTRAVENOUS | Status: AC
  Administered 2015-05-26: 20:00:00 via INTRAVENOUS

## 2015-05-26 MED ORDER — OSMOLITE 1.5 CAL PO LIQD
1000.0000 mL | ORAL | Status: AC
  Administered 2015-05-26 – 2015-05-27 (×2): 1000 mL
  Filled 2015-05-26 (×4): qty 1000

## 2015-05-26 MED ORDER — ALBUTEROL SULFATE (2.5 MG/3ML) 0.083% IN NEBU
2.5000 mg | INHALATION_SOLUTION | Freq: Four times a day (QID) | RESPIRATORY_TRACT | Status: DC | PRN
  Administered 2015-05-26 – 2015-05-27 (×3): 2.5 mg via RESPIRATORY_TRACT
  Filled 2015-05-26 (×3): qty 3

## 2015-05-26 MED ORDER — PRO-STAT SUGAR FREE PO LIQD
30.0000 mL | Freq: Every day | ORAL | Status: DC
  Administered 2015-05-26 – 2015-05-29 (×4): 30 mL
  Filled 2015-05-26 (×4): qty 30

## 2015-05-26 MED ORDER — JEVITY 1.2 CAL PO LIQD: 1000.0000 mL | ORAL | Status: DC

## 2015-05-26 NOTE — Progress Notes (Signed)
  Date: 05/26/2015  Patient name: Terry Palmer  Medical record number: [id number]  Date of birth: 08-Apr-1975   I have seen and evaluated Terry Palmer and discussed their care with the Residency Team. Briefly, Terry Palmer is a 40yo man with PMH of end stage MS with quadriplegia, dementia and seizure disorder who presented to the ED with AMS and fever. Family was not present when I saw the patient so history obtained from chart review and discussion with resident team.  Terry Palmer lives at home, apparently has been having AMS and some twitching of the lips on the night prior to admission.  The morning of admission he was noted to have emesis of unknown time, lethargy and fever.  Also noted was that the urine appeared to be darker.  He has a suprapubic catheter and gastric feeding tube.  Further symptoms included increased belching.  He does not have wounds as he is turned frequently.  He has had issues with aspiration pneumonia and UTIs.  In the ED, he was also noted to be tachycardic and was started on Abx.   Fam Hx, and/or Soc Hx : Family history reviewed and no pertinent.  Former smoker.   Filed Vitals:   05/26/15 1234 05/26/15 1635  BP: 106/66 116/68  Pulse: 83 93  Temp: 98.7 F (37.1 C) 98.5 F (36.9 C)  Resp: 16 16   Physical exam:  Gen: Nonverbal.  Opened eyes to voice.   HENT: MMM Eyes: Anicteric sclerae, + corneal reflex, closes eyes to direct threat CV: RR, NR, no murmur Pulm: CTAB anteriorly, breathing comfortably Abd: G tube in place, no erythema or drainage.  Suprapubic catheter in place, no drainage or erythema.  No apparent pain to palpation, no distention Ext: Contracted in all four extremities, left hand/arm seemed cold.  Neuro: No obvious response to yes/no questions.   Pertinent data WBC 22.6 (chronically elevated) Cr 0.75  UA: Turbid, large leukocytes  CT head: as read by Dr. Pascal Lux IMPRESSION: History of end-stage multiple sclerosis with severe brain  atrophy that has markedly progressed since 2013. No acute intracranial Finding.  CXR as read by Dr. Golden Circle No active disease  EKG with sinus tachycardia   Assessment and Plan: I have seen and evaluated the patient as outlined above. I agree with the formulated Assessment and Plan as detailed in the residents' note, with the following changes:   1. AMS, fever, leukocytosis, likely infection - Sepsis still a possibility.  Likely source urinary - BC and UC pending - Continue vancomycin and zosyn for now - IVF as needed to maintain blood pressure.  - Monitor for fever - Also could be seizure, given history.  EEG ordered and given Keppra IV - Neurology consult  2. End Stage MS - Continue home meds  NPO, continue tube feeding unless increased emesis, then hold.   Sid Falcon, MD 5/18/20174:37 PM

## 2015-05-26 NOTE — Progress Notes (Signed)
Pharmacy -Physician communication re: BCID culture results and antibiotics  Pt continues on Vancomycin and Zosyn (Day #2) for sepsis. See BCID culture results below.  Plan:  BCID results were discussed with Dr. Posey Pronto and the patient's current antibiotics will be continued. Further narrowing will be determined based on finalized susceptibilities.  Microbiology: Recent Results (from the past 720 hour(s))  Blood Culture (routine x 2)     Status: None (Preliminary result)   Collection Time: 05/25/15 10:40 AM  Result Value Ref Range Status   Specimen Description BLOOD LEFT HAND  Final   Special Requests BOTTLES DRAWN AEROBIC AND ANAEROBIC 5CC  Final   Culture  Setup Time   Final    GRAM POSITIVE COCCI IN CLUSTERS IN BOTH AEROBIC AND ANAEROBIC BOTTLES Organism ID to follow CRITICAL RESULT CALLED TO, READ BACK BY AND VERIFIED WITH: Violet Cart @0630  05/26/15 MKELLY    Culture PENDING  Incomplete   Report Status PENDING  Incomplete  Blood Culture ID Panel (Reflexed)     Status: Abnormal   Collection Time: 05/25/15 10:40 AM  Result Value Ref Range Status   Enterococcus species NOT DETECTED NOT DETECTED Final   Vancomycin resistance NOT DETECTED NOT DETECTED Final   Listeria monocytogenes NOT DETECTED NOT DETECTED Final   Staphylococcus species DETECTED (A) NOT DETECTED Final    Comment: Carmelita Amparo @0630  05/26/15 MKELLY   Staphylococcus aureus NOT DETECTED NOT DETECTED Final   Methicillin resistance DETECTED (A) NOT DETECTED Final    Comment: Emmalene Kattner @0630  05/26/15 MKELLY   Streptococcus species NOT DETECTED NOT DETECTED Final   Streptococcus agalactiae NOT DETECTED NOT DETECTED Final   Streptococcus pneumoniae NOT DETECTED NOT DETECTED Final   Streptococcus pyogenes NOT DETECTED NOT DETECTED Final   Acinetobacter baumannii NOT DETECTED NOT DETECTED Final   Enterobacteriaceae species NOT DETECTED NOT DETECTED Final   Enterobacter cloacae complex NOT DETECTED NOT DETECTED Final   Escherichia coli NOT DETECTED NOT DETECTED Final   Klebsiella oxytoca NOT DETECTED NOT DETECTED Final   Klebsiella pneumoniae NOT DETECTED NOT DETECTED Final   Proteus species NOT DETECTED NOT DETECTED Final   Serratia marcescens NOT DETECTED NOT DETECTED Final   Carbapenem resistance NOT DETECTED NOT DETECTED Final   Haemophilus influenzae NOT DETECTED NOT DETECTED Final   Neisseria meningitidis NOT DETECTED NOT DETECTED Final   Pseudomonas aeruginosa NOT DETECTED NOT DETECTED Final   Candida albicans NOT DETECTED NOT DETECTED Final   Candida glabrata NOT DETECTED NOT DETECTED Final   Candida krusei NOT DETECTED NOT DETECTED Final   Candida parapsilosis NOT DETECTED NOT DETECTED Final   Candida tropicalis NOT DETECTED NOT DETECTED Final  MRSA PCR Screening     Status: None   Collection Time: 05/25/15  9:07 PM  Result Value Ref Range Status   MRSA by PCR NEGATIVE NEGATIVE Final    Comment:        The GeneXpert MRSA Assay (FDA approved for NASAL specimens only), is one component of a comprehensive MRSA colonization surveillance program. It is not intended to diagnose MRSA infection nor to guide or monitor treatment for MRSA infections.      Sherlon Handing, PharmD, BCPS Clinical pharmacist, pager [phone] 05/26/2015,6:50 AM

## 2015-05-26 NOTE — Progress Notes (Addendum)
Initial Nutrition Assessment  DOCUMENTATION CODES:   Not applicable  INTERVENTION:    Nocturnal TF regimen of Osmolite 1.5 formula at 80 ml/hr via PEG tube from 7 AM to 9 PM >> total time is 14 hours.   Prostat liquid protein 30 ml daily via tube  Total TF regimen to provide 1780 kcals, 85 gm protein, 853 ml of free water daily.  NUTRITION DIAGNOSIS:   Inadequate oral intake related to inability to eat as evidenced by NPO status  GOAL:   Patient will meet greater than or equal to 90% of their needs  MONITOR:   TF tolerance, Labs, Weight trends, I & O's  REASON FOR ASSESSMENT:   Consult Enteral/tube feeding initiation and management  ASSESSMENT:   40 y.o. man with PMHx significant for end-stage multiple sclerosis, quadriplegia & quadriparesis, dementia d/t MS, aspiration PNA, recurrent UTI, and seizures who presented to the ED for altered mental status and fever.   RD spoke with patient's Mom at bedside >> pt is nonverbal. Home TF regimen is Osmolite 1.5 formula at 80 ml/hr via PEG tube from 7 AM to 9 PM. Pt takes nothing by mouth >> weight has been stable. Free water flushes at 240 ml every 4 hours. Nutrition Focused Physical Exam not applicable given muscle loss associated with medical hx.  Diet Order:  Diet NPO time specified  Skin:  Reviewed, no issues  Last BM:  N/A  Height:   Ht Readings from Last 1 Encounters:  05/25/15 6' (1.829 m)    Weight:   Wt Readings from Last 1 Encounters:  05/25/15 159 lb 3.2 oz (72.213 kg)    Ideal Body Weight:  81 kg  BMI:  Body mass index is 21.59 kg/(m^2).  Estimated Nutritional Needs:   Kcal:  1650-1850  Protein:  80-90 gm  Fluid:  1.6-1.8 L  EDUCATION NEEDS:   No education needs identified at this time  Arthur Holms, RD, LDN Pager #: [phone] After-Hours Pager #: [phone]

## 2015-05-26 NOTE — Progress Notes (Signed)
Subjective: Patient seen and examined this morning.  No acute events overnight.  His fever has come down and last temperature recorded is 98.8.  No family is present at the bedside.  Patient unable to provide any further subjective history.  Objective: Vital signs in last 24 hours: Filed Vitals:   05/26/15 0000 05/26/15 0400 05/26/15 0600 05/26/15 0700  BP: 120/69 112/70 106/73 106/67  Pulse: 95 80 76 79  Temp: 100.5 F (38.1 C) 99.7 F (37.6 C)  98.8 F (37.1 C)  TempSrc: Rectal Rectal  Axillary  Resp: 19 13 12 12   Height:      Weight:      SpO2: 100% 98% 100% 98%   Weight change:   Intake/Output Summary (Last 24 hours) at 05/26/15 1104 Last data filed at 05/26/15 0700  Gross per 24 hour  Intake   2020 ml  Output   2155 ml  Net   -135 ml   General: lying in bed, no responding to questions, eyes open HEENT: eyes open, not blinking his eyes but more reactive to corneal reflex and startle  Cardiac: RRR Pulm: effort normal Abd: G-tube in place with no surrounding erythema.  GU: suprapubic catheter in place with no surrounding erythema Ext: flexion contractures of all 4 extremities Skin: + intertrigo Neuro: not responsive to verbal stimuli  Lab Results: Basic Metabolic Panel:  Recent Labs Lab 05/25/15 1016 05/25/15 1830 05/26/15 0643  NA 140  --  134*  K 4.6  --  3.8  CL 99*  --  101  CO2 25  --  19*  GLUCOSE 100*  --  93  BUN 11  --  6  CREATININE 0.75 0.60* 0.56*  CALCIUM 9.8  --  8.7*   Liver Function Tests:  Recent Labs Lab 05/25/15 1016 05/26/15 0643  AST 23 25  ALT 24 20  ALKPHOS 89 68  BILITOT 0.4 0.7  PROT 7.5 6.2*  ALBUMIN 4.2 3.3*   CBC:  Recent Labs Lab 05/25/15 1016 05/25/15 1830 05/26/15 0643  WBC 22.6* 22.8* 17.2*  NEUTROABS 20.3*  --   --   HGB 14.0 12.3* 11.9*  HCT 42.9 36.9* 36.4*  MCV 93.9 93.9 95.5  PLT 238 207 164   Urine Drug Screen: Drugs of Abuse     Component Value Date/Time   LABOPIA NONE DETECTED  10/20/2014 1817   COCAINSCRNUR NONE DETECTED 10/20/2014 1817   LABBENZ POSITIVE* 10/20/2014 1817   AMPHETMU NONE DETECTED 10/20/2014 1817   THCU NONE DETECTED 10/20/2014 1817   LABBARB NONE DETECTED 10/20/2014 1817    Urinalysis:  Recent Labs Lab 05/25/15 1019  COLORURINE YELLOW  LABSPEC 1.025  PHURINE 8.5*  GLUCOSEU NEGATIVE  HGBUR NEGATIVE  BILIRUBINUR NEGATIVE  KETONESUR NEGATIVE  PROTEINUR 30*  NITRITE NEGATIVE  LEUKOCYTESUR LARGE*     Micro Results: Recent Results (from the past 240 hour(s))  Urine culture     Status: Abnormal (Preliminary result)   Collection Time: 05/25/15 10:19 AM  Result Value Ref Range Status   Specimen Description URINE, SUPRAPUBIC  Final   Special Requests NONE  Final   Culture >=100,000 COLONIES/mL PROTEUS MIRABILIS (A)  Final   Report Status PENDING  Incomplete  Blood Culture (routine x 2)     Status: None (Preliminary result)   Collection Time: 05/25/15 10:40 AM  Result Value Ref Range Status   Specimen Description BLOOD LEFT HAND  Final   Special Requests BOTTLES DRAWN AEROBIC AND ANAEROBIC 5CC  Final   Culture  Setup Time   Final    GRAM POSITIVE COCCI IN CLUSTERS IN BOTH AEROBIC AND ANAEROBIC BOTTLES Organism ID to follow CRITICAL RESULT CALLED TO, READ BACK BY AND VERIFIED WITH: CARON AMEND @0630  05/26/15 MKELLY    Culture TOO YOUNG TO READ  Final   Report Status PENDING  Incomplete  Blood Culture ID Panel (Reflexed)     Status: Abnormal   Collection Time: 05/25/15 10:40 AM  Result Value Ref Range Status   Enterococcus species NOT DETECTED NOT DETECTED Final   Vancomycin resistance NOT DETECTED NOT DETECTED Final   Listeria monocytogenes NOT DETECTED NOT DETECTED Final   Staphylococcus species DETECTED (A) NOT DETECTED Final    Comment: CARON AMEND @0630  05/26/15 MKELLY   Staphylococcus aureus NOT DETECTED NOT DETECTED Final   Methicillin resistance DETECTED (A) NOT DETECTED Final    Comment: CARON AMEND @0630  05/26/15  MKELLY   Streptococcus species NOT DETECTED NOT DETECTED Final   Streptococcus agalactiae NOT DETECTED NOT DETECTED Final   Streptococcus pneumoniae NOT DETECTED NOT DETECTED Final   Streptococcus pyogenes NOT DETECTED NOT DETECTED Final   Acinetobacter baumannii NOT DETECTED NOT DETECTED Final   Enterobacteriaceae species NOT DETECTED NOT DETECTED Final   Enterobacter cloacae complex NOT DETECTED NOT DETECTED Final   Escherichia coli NOT DETECTED NOT DETECTED Final   Klebsiella oxytoca NOT DETECTED NOT DETECTED Final   Klebsiella pneumoniae NOT DETECTED NOT DETECTED Final   Proteus species NOT DETECTED NOT DETECTED Final   Serratia marcescens NOT DETECTED NOT DETECTED Final   Carbapenem resistance NOT DETECTED NOT DETECTED Final   Haemophilus influenzae NOT DETECTED NOT DETECTED Final   Neisseria meningitidis NOT DETECTED NOT DETECTED Final   Pseudomonas aeruginosa NOT DETECTED NOT DETECTED Final   Candida albicans NOT DETECTED NOT DETECTED Final   Candida glabrata NOT DETECTED NOT DETECTED Final   Candida krusei NOT DETECTED NOT DETECTED Final   Candida parapsilosis NOT DETECTED NOT DETECTED Final   Candida tropicalis NOT DETECTED NOT DETECTED Final  MRSA PCR Screening     Status: None   Collection Time: 05/25/15  9:07 PM  Result Value Ref Range Status   MRSA by PCR NEGATIVE NEGATIVE Final    Comment:        The GeneXpert MRSA Assay (FDA approved for NASAL specimens only), is one component of a comprehensive MRSA colonization surveillance program. It is not intended to diagnose MRSA infection nor to guide or monitor treatment for MRSA infections.    Studies/Results: Ct Head Wo Contrast  05/25/2015  CLINICAL DATA:  Altered mental status. End-stage multiple sclerosis. EXAM: CT HEAD WITHOUT CONTRAST TECHNIQUE: Contiguous axial images were obtained from the base of the skull through the vertex without intravenous contrast. COMPARISON:  03/12/2011 FINDINGS: Skull and  Sinuses:Negative for fracture or destructive process. The visualized mastoids, middle ears, and imaged paranasal sinuses are clear. 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 Despite interval development the arthropathy has chronic features and no surrounding soft tissue inflammation. Visualized orbits: Dysconjugate gaze.  No acute finding. Brain: Severe atrophy with ventriculomegaly. There is confluent cerebral white matter low density with markedly thinned white matter. No signs of acute infarct or hemorrhage. 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 No acute intracranial finding. Electronically Signed   By: Monte Fantasia M.D.   On: 05/25/2015 14:57   Dg Chest Port 1 View  05/25/2015  CLINICAL DATA:  Fevers EXAM: PORTABLE CHEST 1 VIEW COMPARISON:  03/03/2015 FINDINGS: Cardiac shadow is within normal limits. The lungs are well aerated bilaterally. No focal infiltrate or sizable effusion is seen. No acute bony abnormality is noted. IMPRESSION: No active disease. Electronically Signed   By: Inez Catalina M.D.   On: 05/25/2015 11:32   Medications: I have reviewed the patient's current medications. Scheduled Meds: . antiseptic oral rinse  7 mL Mouth Rinse q12n4p  . baclofen  20 mg Per Tube TID  . buPROPion  75 mg Per Tube TID  . chlorhexidine  15 mL Mouth Rinse BID  . dantrolene  200 mg Oral QHS  . enoxaparin (LOVENOX) injection  40 mg Subcutaneous Q24H  . free water  240 mL Per Tube Q4H  . levETIRAcetam  1,500 mg Per Tube BID  . nystatin   Topical TID  . pantoprazole sodium  40 mg Per Tube Daily  . piperacillin-tazobactam (ZOSYN)  IV  3.375 g Intravenous Q8H  . polyvinyl alcohol  1 drop Both Eyes TID  . predniSONE  25 mg Per Tube Q breakfast  . senna  2 tablet Oral Daily  . 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  Otherwise, he does not blink his eyes in response to questions and his extremities remain in flexion contracture.  He has had 1 of 2 blood cultures grow GPC in clusters in the aerobic and anaerobic bottles.  Blood culture ID is detecting Staph species with Methicillin resistance but not Staph aureus.  His urine culture is growing > 100,000 colonies of Proteus mirabilis, which is something he has grown in the past so it is entirely possible that this is colonization from his suprapubic catheter.  His WBC has trended down on antibiotics and his fever has resolved as of this morning.  EEG was done and results are pending. - follow up blood cultures - repeat blood cultures 48 hours after starting antibiotics - if cultures do not grow Gram negatives at 48 hours, can likely d/c Zosyn - continue Vanc and Zosyn today - if going to treat UTI, can de-escalate as appropriate when d/c Zosyn  - follow WBC, fever curve  - repeat CXR if concern for aspiration arises to see if an infiltrate has developed  - IV fluids  - Keppra  - f/u EEG  - neurology consulted  - hopefully will not need LP based on culture data thus far  Seizure History: on Keppra 1gm BID at home  - continue increased Keppra  - EEG - neurology consulted   End-Stage MS: recent neurology notes state no indication for disease modifying agents. Neurology notes also reflect patient wife and caretaker may have an unrealistic interpretation of his current abilities and belief he may  possibly be blind.  - continue baclofen, dantrolene, tizanidine  - palliative care consult may need to be considered at this point  Failure to Thrive  - tube feeds per nutrition   Diet: NPO   DVT PPx: Lovenox   Code: FULL  Dispo: Disposition is deferred at this time, awaiting improvement of current medical problems.  Anticipated discharge in approximately 2-3 day(s).   The patient does have a current PCP ([name], MD) and does not need an [facility] hospital follow-up appointment after discharge.  The patient does have transportation limitations that hinder transportation to clinic appointments.  .Services Needed at time of discharge: Y = Yes, Blank = No PT:   OT:   RN:   Equipment:   Other:     LOS: 1 day   Jule Ser, DO 05/26/2015, 11:04 AM

## 2015-05-26 NOTE — Plan of Care (Signed)
Problem: Education: Goal: Knowledge of [facility] Education information/materials will improve Outcome: Not Progressing Patient is not verbally responsive.

## 2015-05-26 NOTE — Progress Notes (Signed)
EEG completed; results pending.    

## 2015-05-27 DIAGNOSIS — G9340 Encephalopathy, unspecified: Secondary | ICD-10-CM

## 2015-05-27 DIAGNOSIS — R627 Adult failure to thrive: Secondary | ICD-10-CM

## 2015-05-27 DIAGNOSIS — R7881 Bacteremia: Secondary | ICD-10-CM

## 2015-05-27 LAB — BASIC METABOLIC PANEL
Anion gap: 9 (ref 5–15)
BUN: 6 mg/dL (ref 6–20)
CHLORIDE: 103 mmol/L (ref 101–111)
CO2: 28 mmol/L (ref 22–32)
Calcium: 8.8 mg/dL — ABNORMAL LOW (ref 8.9–10.3)
Creatinine, Ser: 0.64 mg/dL (ref 0.61–1.24)
GFR calc Af Amer: >60 mL/min (ref >60)
GFR calc non Af Amer: >60 mL/min (ref >60)
GLUCOSE: 144 mg/dL — AB (ref 65–99)
POTASSIUM: 3.5 mmol/L (ref 3.5–5.1)
Sodium: 140 mmol/L (ref 135–145)

## 2015-05-27 LAB — GLUCOSE, CAPILLARY
GLUCOSE-CAPILLARY: 216 mg/dL — AB (ref 65–99)
GLUCOSE-CAPILLARY: 159 mg/dL — AB (ref 65–99)
Glucose-Capillary: 104 mg/dL — ABNORMAL HIGH (ref 65–99)
Glucose-Capillary: 106 mg/dL — ABNORMAL HIGH (ref 65–99)
Glucose-Capillary: 192 mg/dL — ABNORMAL HIGH (ref 65–99)

## 2015-05-27 LAB — VANCOMYCIN, TROUGH: Vancomycin Tr: 11 ug/mL (ref 10.0–20.0)

## 2015-05-27 LAB — CBC
HEMATOCRIT: 37.8 % — AB (ref 39.0–52.0)
Hemoglobin: 12.2 g/dL — ABNORMAL LOW (ref 13.0–17.0)
MCH: 29.8 pg (ref 26.0–34.0)
MCHC: 32.3 g/dL (ref 30.0–36.0)
MCV: 92.2 fL (ref 78.0–100.0)
Platelets: 183 10*3/uL (ref 150–400)
RBC: 4.1 MIL/uL — ABNORMAL LOW (ref 4.22–5.81)
RDW: 12.9 % (ref 11.5–15.5)
WBC: 10 10*3/uL (ref 4.0–10.5)

## 2015-05-27 MED ORDER — VANCOMYCIN HCL IN DEXTROSE 750-5 MG/150ML-% IV SOLN
750.0000 mg | Freq: Three times a day (TID) | INTRAVENOUS | Status: DC
  Administered 2015-05-27 – 2015-05-28 (×2): 750 mg via INTRAVENOUS
  Filled 2015-05-27 (×4): qty 150

## 2015-05-27 NOTE — Progress Notes (Signed)
Interval History:                                                                                                                      Terry Palmer is an 40 y.o. male patient with possible seizure in setting of Sepsis. No clinical seizure noted.    Past Medical History: Past Medical History  Diagnosis Date  . MS (multiple sclerosis) ([place])   . Quadriparesis (muscle weakness) ([place]) 03/12/2011  . Childhood asthma   . Depression   . Neuromuscular disorder ([place])     Quadraperesis  . Recurrent UTI   . Dysphagia   . Bladder calculi   . Neurogenic bladder   . Shortness of breath   . Recurrent upper respiratory infection (URI)   . Normocytic anemia 05/28/2011  . Aspiration pneumonia ([place]) 01/20/2013  . 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 Quadriplegia and quadriparesis ([place]) 12/24/2014  . 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 Lumbar puncture  10/12/2002  . Gastrostomy  04/16/2011    Procedure: GASTROSTOMY;  Surgeon: Zenovia Jarred, MD;  Location: [facility] OR;  Service: General;  Laterality: N/A;  Open G-Tube placement    Family History: Family History  Problem Relation Age of Onset  . Asthma Mother     Social History:   reports that he quit smoking about 16 years ago. His smoking use included Cigarettes. He has quit using smokeless tobacco. He reports that he does not drink alcohol. His drug history is not on file.  Allergies:  No Known Allergies   Medications:                                                                                                                         Current facility-administered medications:  .  acetaminophen (TYLENOL) tablet 500 mg, 500 mg, Oral, Q8H PRN, Tasrif Ahmed, MD .  albuterol (PROVENTIL) (2.5 MG/3ML) 0.083% nebulizer solution 2.5 mg, 2.5 mg, Nebulization, Q6H PRN, Jule Ser, DO, 2.5 mg at 05/26/15 1504 .  antiseptic oral rinse (CPC / CETYLPYRIDINIUM CHLORIDE 0.05%)  solution 7 mL, 7 mL, Mouth Rinse, q12n4p, Sid Falcon, MD, 7 mL at 05/26/15 1600 .  baclofen (LIORESAL) tablet 20 mg, 20 mg, Per Tube, TID, Tasrif Ahmed, MD, 20 mg at 05/27/15 0908 .  bisacodyl (DULCOLAX) suppository 10 mg, 10 mg, Rectal, Once PRN, Tasrif Ahmed, MD .  buPROPion [name]) tablet 75 mg, 75 mg, Per  Tube, TID, Tasrif Ahmed, MD, 75 mg at 05/27/15 0908 .  chlorhexidine (PERIDEX) 0.12 % solution 15 mL, 15 mL, Mouth Rinse, BID, Sid Falcon, MD, 15 mL at 05/27/15 0909 .  dantrolene (DANTRIUM) capsule 200 mg, 200 mg, Oral, QHS, Tasrif Ahmed, MD, 200 mg at 05/26/15 2119 .  enoxaparin (LOVENOX) injection 40 mg, 40 mg, Subcutaneous, Q24H, Tasrif Ahmed, MD, 40 mg at 05/26/15 2119 .  feeding supplement (OSMOLITE 1.5 CAL) liquid 1,000 mL, 1,000 mL, Per Tube, Continuous, Rogue Bussing, RD, Last Rate: 80 mL/hr at 05/27/15 0654, 1,000 mL at 05/27/15 0654 .  feeding supplement (PRO-STAT SUGAR FREE 64) liquid 30 mL, 30 mL, Per Tube, Daily, Sid Falcon, MD, 30 mL at 05/27/15 0909 .  free water 240 mL, 240 mL, Per Tube, Q4H, Tasrif Ahmed, MD, 240 mL at 05/27/15 0843 .  HYDROcodone-acetaminophen (NORCO) 7.5-325 MG per tablet 1 tablet, 1 tablet, Per Tube, Daily PRN, Tasrif Ahmed, MD .  levETIRAcetam (KEPPRA) 100 MG/ML solution 1,500 mg, 1,500 mg, Per Tube, BID, Ram Fuller Mandril, MD, 1,500 mg at 05/27/15 0522 .  nystatin (MYCOSTATIN) powder, , Topical, TID, Sid Falcon, MD .  ondansetron [facility]) tablet 4 mg, 4 mg, Per Tube, Q6H PRN, Tasrif Ahmed, MD .  pantoprazole sodium (PROTONIX) 40 mg/20 mL oral suspension 40 mg, 40 mg, Per Tube, Daily, Tasrif Ahmed, MD, 40 mg at 05/27/15 0909 .  piperacillin-tazobactam (ZOSYN) IVPB 3.375 g, 3.375 g, Intravenous, Q8H, Wynelle Fanny, RPH, 3.375 g at 05/27/15 0522 .  polyvinyl alcohol (LIQUIFILM TEARS) 1.4 % ophthalmic solution 1 drop, 1 drop, Both Eyes, TID, Tasrif Ahmed, MD, 1 drop at 05/27/15 0909 .  predniSONE 5 MG/ML concentrated  solution 25 mg, 25 mg, Per Tube, Q breakfast, Tasrif Ahmed, MD, 25 mg at 05/27/15 0843 .  senna (SENOKOT) tablet 17.2 mg, 2 tablet, Oral, Daily, Tasrif Ahmed, MD, 17.2 mg at 05/27/15 0909 .  tiZANidine (ZANAFLEX) tablet 2 mg, 2 mg, Oral, Q6H PRN, Tasrif Ahmed, MD .  vancomycin (VANCOCIN) IVPB 1000 mg/200 mL premix, 1,000 mg, Intravenous, Q12H, Wynelle Fanny, RPH, 1,000 mg at 05/27/15 0000   Neurologic Examination:                                                                                                     Today's Vitals   05/27/15 0343 05/27/15 0400 05/27/15 0500 05/27/15 0700  BP:  124/81 118/81 120/78  Pulse: 75 79 79 73  Temp: 99.1 F (37.3 C)   98.8 F (37.1 C)  TempSrc: Oral   Oral  Resp: 14 6 13 6   Height:      Weight:   73 kg (160 lb 15 oz)   SpO2: 100% 100% 100% 100%    Evaluation of higher integrative functions including: Mental Status: Awake with eyes open Non verbal Does not track--eyes disconjugate Blinks to threat Pupils equal and reactive 29mm Moans Arms are contracted Legs are contracted   Lab Results: Basic Metabolic Panel:  Recent Labs Lab 05/25/15 1016 05/25/15 1830 05/26/15 0643 05/27/15 0906  NA 140  --  134* 140  K 4.6  --  3.8 3.5  CL 99*  --  101 103  CO2 25  --  19* 28  GLUCOSE 100*  --  93 144*  BUN 11  --  6 6  CREATININE 0.75 0.60* 0.56* 0.64  CALCIUM 9.8  --  8.7* 8.8*    Liver Function Tests:  Recent Labs Lab 05/25/15 1016 05/26/15 0643  AST 23 25  ALT 24 20  ALKPHOS 89 68  BILITOT 0.4 0.7  PROT 7.5 6.2*  ALBUMIN 4.2 3.3*   No results for input(s): LIPASE, AMYLASE in the last 168 hours. No results for input(s): AMMONIA in the last 168 hours.  CBC:  Recent Labs Lab 05/25/15 1016 05/25/15 1830 05/26/15 0643 05/27/15 0906  WBC 22.6* 22.8* 17.2* 10.0  NEUTROABS 20.3*  --   --   --   HGB 14.0 12.3* 11.9* 12.2*  HCT 42.9 36.9* 36.4* 37.8*  MCV 93.9 93.9 95.5 92.2  PLT 238 207 164 183    Cardiac  Enzymes: No results for input(s): CKTOTAL, CKMB, CKMBINDEX, TROPONINI in the last 168 hours.  Lipid Panel: No results for input(s): CHOL, TRIG, HDL, CHOLHDL, VLDL, LDLCALC in the last 168 hours.  CBG:  Recent Labs Lab 05/26/15 1633 05/26/15 2021 05/26/15 2346 05/27/15 0345 05/27/15 0722  GLUCAP 140* 153* 98 104* 106*    Microbiology: Results for orders placed or performed during the hospital encounter of 05/25/15  Blood Culture (routine x 2)     Status: None (Preliminary result)   Collection Time: 05/25/15 10:16 AM  Result Value Ref Range Status   Specimen Description BLOOD RIGHT HAND  Final   Special Requests IN PEDIATRIC BOTTLE 0.5CC  Final   Culture NO GROWTH 1 DAY  Final   Report Status PENDING  Incomplete  Urine culture     Status: Abnormal (Preliminary result)   Collection Time: 05/25/15 10:19 AM  Result Value Ref Range Status   Specimen Description URINE, SUPRAPUBIC  Final   Special Requests NONE  Final   Culture (A)  Final    >=100,000 COLONIES/mL PROTEUS MIRABILIS SUSCEPTIBILITIES TO FOLLOW    Report Status PENDING  Incomplete  Blood Culture (routine x 2)     Status: None (Preliminary result)   Collection Time: 05/25/15 10:40 AM  Result Value Ref Range Status   Specimen Description BLOOD LEFT HAND  Final   Special Requests BOTTLES DRAWN AEROBIC AND ANAEROBIC 5CC  Final   Culture  Setup Time   Final    GRAM POSITIVE COCCI IN CLUSTERS IN BOTH AEROBIC AND ANAEROBIC BOTTLES Organism ID to follow CRITICAL RESULT CALLED TO, READ BACK BY AND VERIFIED WITH: CARON AMEND @0630  05/26/15 MKELLY    Culture TOO YOUNG TO READ  Final   Report Status PENDING  Incomplete  Blood Culture ID Panel (Reflexed)     Status: Abnormal   Collection Time: 05/25/15 10:40 AM  Result Value Ref Range Status   Enterococcus species NOT DETECTED NOT DETECTED Final   Vancomycin resistance NOT DETECTED NOT DETECTED Final   Listeria monocytogenes NOT DETECTED NOT DETECTED Final    Staphylococcus species DETECTED (A) NOT DETECTED Final    Comment: CARON AMEND @0630  05/26/15 MKELLY   Staphylococcus aureus NOT DETECTED NOT DETECTED Final   Methicillin resistance DETECTED (A) NOT DETECTED Final    Comment: CARON AMEND @0630  05/26/15 MKELLY   Streptococcus species NOT DETECTED NOT DETECTED Final   Streptococcus agalactiae NOT DETECTED NOT DETECTED Final   Streptococcus  pneumoniae NOT DETECTED NOT DETECTED Final   Streptococcus pyogenes NOT DETECTED NOT DETECTED Final   Acinetobacter baumannii NOT DETECTED NOT DETECTED Final   Enterobacteriaceae species NOT DETECTED NOT DETECTED Final   Enterobacter cloacae complex NOT DETECTED NOT DETECTED Final   Escherichia coli NOT DETECTED NOT DETECTED Final   Klebsiella oxytoca NOT DETECTED NOT DETECTED Final   Klebsiella pneumoniae NOT DETECTED NOT DETECTED Final   Proteus species NOT DETECTED NOT DETECTED Final   Serratia marcescens NOT DETECTED NOT DETECTED Final   Carbapenem resistance NOT DETECTED NOT DETECTED Final   Haemophilus influenzae NOT DETECTED NOT DETECTED Final   Neisseria meningitidis NOT DETECTED NOT DETECTED Final   Pseudomonas aeruginosa NOT DETECTED NOT DETECTED Final   Candida albicans NOT DETECTED NOT DETECTED Final   Candida glabrata NOT DETECTED NOT DETECTED Final   Candida krusei NOT DETECTED NOT DETECTED Final   Candida parapsilosis NOT DETECTED NOT DETECTED Final   Candida tropicalis NOT DETECTED NOT DETECTED Final  MRSA PCR Screening     Status: None   Collection Time: 05/25/15  9:07 PM  Result Value Ref Range Status   MRSA by PCR NEGATIVE NEGATIVE Final    Comment:        The GeneXpert MRSA Assay (FDA approved for NASAL specimens only), is one component of a comprehensive MRSA colonization surveillance program. It is not intended to diagnose MRSA infection nor to guide or monitor treatment for MRSA infections.     Imaging: Ct Head Wo Contrast  05/25/2015  CLINICAL DATA:  Altered mental  status. End-stage multiple sclerosis. EXAM: CT HEAD WITHOUT CONTRAST TECHNIQUE: Contiguous axial images were obtained from the base of the skull through the vertex without intravenous contrast. COMPARISON:  03/12/2011 FINDINGS: Skull and Sinuses:Negative for fracture or destructive process. The visualized mastoids, middle ears, and imaged paranasal sinuses are clear. 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 Despite interval development the arthropathy has chronic features and no surrounding soft tissue inflammation. Visualized orbits: Dysconjugate gaze.  No acute finding. Brain: Severe atrophy with ventriculomegaly. There is confluent cerebral white matter low density with markedly thinned white matter. No signs of acute infarct or hemorrhage. 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 No acute intracranial finding. Electronically Signed   By: Monte Fantasia M.D.   On: 05/25/2015 14:57   Dg Chest Port 1 View  05/25/2015  CLINICAL DATA:  Fevers EXAM: PORTABLE CHEST 1 VIEW COMPARISON:  03/03/2015 FINDINGS: Cardiac shadow is within normal limits. The lungs are well aerated bilaterally. No focal infiltrate or sizable effusion is seen. No acute bony abnormality is noted. IMPRESSION: No active disease. Electronically Signed   By: Inez Catalina M.D.   On: 05/25/2015 11:32    Assessment and plan:   DASMINE TROEGER is an 40 y.o. male patient with end stage MS. EEG shows encephalopathy and no seizure activity.   Recommend.  Continue to treat underlying infection Continue current AED dose.

## 2015-05-27 NOTE — Progress Notes (Signed)
Subjective: Patient seen and examined this morning.  No acute events overnight were noted.  He cannot provide any interval history.  Objective: Vital signs in last 24 hours: Filed Vitals:   05/27/15 0343 05/27/15 0400 05/27/15 0500 05/27/15 0700  BP:  124/81 118/81 120/78  Pulse: 75 79 79 73  Temp: 99.1 F (37.3 C)   98.8 F (37.1 C)  TempSrc: Oral   Oral  Resp: 14 6 13 6   Height:      Weight:   160 lb 15 oz (73 kg)   SpO2: 100% 100% 100% 100%   Weight change: -9 lb 1 oz (-4.112 kg)  Intake/Output Summary (Last 24 hours) at 05/27/15 0843 Last data filed at 05/27/15 0732  Gross per 24 hour  Intake 3139.67 ml  Output   4325 ml  Net -1185.33 ml   General: lying in bed, will blink his eyes to questioning, opens eyes to voice HEENT: eyes open, positive corneal reflex, closes eyes to direct threat Cardiac: RRR, no murmur Pulm: effort normal Abd: G-tube in place with no surrounding erythema or drainage.  GU: suprapubic catheter in place with no surrounding erythema or drainage. Ext: flexion contractures of all 4 extremities Skin: + intertrigo Neuro: some response to yes/no questions but other times will just blink without questioning  Lab Results: Basic Metabolic Panel:  Recent Labs Lab 05/25/15 1016 05/25/15 1830 05/26/15 0643  NA 140  --  134*  K 4.6  --  3.8  CL 99*  --  101  CO2 25  --  19*  GLUCOSE 100*  --  93  BUN 11  --  6  CREATININE 0.75 0.60* 0.56*  CALCIUM 9.8  --  8.7*   Liver Function Tests:  Recent Labs Lab 05/25/15 1016 05/26/15 0643  AST 23 25  ALT 24 20  ALKPHOS 89 68  BILITOT 0.4 0.7  PROT 7.5 6.2*  ALBUMIN 4.2 3.3*   CBC:  Recent Labs Lab 05/25/15 1016 05/25/15 1830 05/26/15 0643  WBC 22.6* 22.8* 17.2*  NEUTROABS 20.3*  --   --   HGB 14.0 12.3* 11.9*  HCT 42.9 36.9* 36.4*  MCV 93.9 93.9 95.5  PLT 238 207 164   Urine Drug Screen: Drugs of Abuse     Component Value Date/Time   LABOPIA NONE DETECTED 10/20/2014 1817     COCAINSCRNUR NONE DETECTED 10/20/2014 1817   LABBENZ POSITIVE* 10/20/2014 1817   AMPHETMU NONE DETECTED 10/20/2014 1817   THCU NONE DETECTED 10/20/2014 1817   LABBARB NONE DETECTED 10/20/2014 1817    Urinalysis:  Recent Labs Lab 05/25/15 1019  COLORURINE YELLOW  LABSPEC 1.025  PHURINE 8.5*  GLUCOSEU NEGATIVE  HGBUR NEGATIVE  BILIRUBINUR NEGATIVE  KETONESUR NEGATIVE  PROTEINUR 30*  NITRITE NEGATIVE  LEUKOCYTESUR LARGE*     Micro Results: Recent Results (from the past 240 hour(s))  Blood Culture (routine x 2)     Status: None (Preliminary result)   Collection Time: 05/25/15 10:16 AM  Result Value Ref Range Status   Specimen Description BLOOD RIGHT HAND  Final   Special Requests IN PEDIATRIC BOTTLE 0.5CC  Final   Culture NO GROWTH 1 DAY  Final   Report Status PENDING  Incomplete  Urine culture     Status: Abnormal (Preliminary result)   Collection Time: 05/25/15 10:19 AM  Result Value Ref Range Status   Specimen Description URINE, SUPRAPUBIC  Final   Special Requests NONE  Final   Culture >=100,000 COLONIES/mL PROTEUS MIRABILIS (A)  Final   Report Status PENDING  Incomplete  Blood Culture (routine x 2)     Status: None (Preliminary result)   Collection Time: 05/25/15 10:40 AM  Result Value Ref Range Status   Specimen Description BLOOD LEFT HAND  Final   Special Requests BOTTLES DRAWN AEROBIC AND ANAEROBIC 5CC  Final   Culture  Setup Time   Final    GRAM POSITIVE COCCI IN CLUSTERS IN BOTH AEROBIC AND ANAEROBIC BOTTLES Organism ID to follow CRITICAL RESULT CALLED TO, READ BACK BY AND VERIFIED WITH: CARON AMEND @0630  05/26/15 MKELLY    Culture TOO YOUNG TO READ  Final   Report Status PENDING  Incomplete  Blood Culture ID Panel (Reflexed)     Status: Abnormal   Collection Time: 05/25/15 10:40 AM  Result Value Ref Range Status   Enterococcus species NOT DETECTED NOT DETECTED Final   Vancomycin resistance NOT DETECTED NOT DETECTED Final   Listeria monocytogenes NOT  DETECTED NOT DETECTED Final   Staphylococcus species DETECTED (A) NOT DETECTED Final    Comment: CARON AMEND @0630  05/26/15 MKELLY   Staphylococcus aureus NOT DETECTED NOT DETECTED Final   Methicillin resistance DETECTED (A) NOT DETECTED Final    Comment: CARON AMEND @0630  05/26/15 MKELLY   Streptococcus species NOT DETECTED NOT DETECTED Final   Streptococcus agalactiae NOT DETECTED NOT DETECTED Final   Streptococcus pneumoniae NOT DETECTED NOT DETECTED Final   Streptococcus pyogenes NOT DETECTED NOT DETECTED Final   Acinetobacter baumannii NOT DETECTED NOT DETECTED Final   Enterobacteriaceae species NOT DETECTED NOT DETECTED Final   Enterobacter cloacae complex NOT DETECTED NOT DETECTED Final   Escherichia coli NOT DETECTED NOT DETECTED Final   Klebsiella oxytoca NOT DETECTED NOT DETECTED Final   Klebsiella pneumoniae NOT DETECTED NOT DETECTED Final   Proteus species NOT DETECTED NOT DETECTED Final   Serratia marcescens NOT DETECTED NOT DETECTED Final   Carbapenem resistance NOT DETECTED NOT DETECTED Final   Haemophilus influenzae NOT DETECTED NOT DETECTED Final   Neisseria meningitidis NOT DETECTED NOT DETECTED Final   Pseudomonas aeruginosa NOT DETECTED NOT DETECTED Final   Candida albicans NOT DETECTED NOT DETECTED Final   Candida glabrata NOT DETECTED NOT DETECTED Final   Candida krusei NOT DETECTED NOT DETECTED Final   Candida parapsilosis NOT DETECTED NOT DETECTED Final   Candida tropicalis NOT DETECTED NOT DETECTED Final  MRSA PCR Screening     Status: None   Collection Time: 05/25/15  9:07 PM  Result Value Ref Range Status   MRSA by PCR NEGATIVE NEGATIVE Final    Comment:        The GeneXpert MRSA Assay (FDA approved for NASAL specimens only), is one component of a comprehensive MRSA colonization surveillance program. It is not intended to diagnose MRSA infection nor to guide or monitor treatment for MRSA infections.    Studies/Results: Ct Head Wo  Contrast  05/25/2015  CLINICAL DATA:  Altered mental status. End-stage multiple sclerosis. EXAM: CT HEAD WITHOUT CONTRAST TECHNIQUE: Contiguous axial images were obtained from the base of the skull through the vertex without intravenous contrast. COMPARISON:  03/12/2011 FINDINGS: Skull and Sinuses:Negative for fracture or destructive process. The visualized mastoids, middle ears, and imaged paranasal sinuses are clear. 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 Despite interval development the arthropathy has chronic features and no surrounding soft tissue inflammation. Visualized orbits: Dysconjugate gaze.  No acute finding. Brain: Severe atrophy with ventriculomegaly. There is confluent cerebral white matter low density with markedly thinned white matter. No signs  of acute infarct or hemorrhage. 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 No acute intracranial finding. Electronically Signed   By: Monte Fantasia M.D.   On: 05/25/2015 14:57   Dg Chest Port 1 View  05/25/2015  CLINICAL DATA:  Fevers EXAM: PORTABLE CHEST 1 VIEW COMPARISON:  03/03/2015 FINDINGS: Cardiac shadow is within normal limits. The lungs are well aerated bilaterally. No focal infiltrate or sizable effusion is seen. No acute bony abnormality is noted. IMPRESSION: No active disease. Electronically Signed   By: Inez Catalina M.D.   On: 05/25/2015 11:32   Medications: I have reviewed the patient's current medications. Scheduled Meds: . antiseptic oral rinse  7 mL Mouth Rinse q12n4p  . baclofen  20 mg Per Tube TID  . buPROPion  75 mg Per Tube TID  . chlorhexidine  15 mL Mouth Rinse BID  . dantrolene  200 mg Oral QHS  . enoxaparin (LOVENOX) injection  40 mg Subcutaneous Q24H  . feeding supplement (PRO-STAT SUGAR FREE 64)  30 mL Per Tube Daily  . free water  240 mL Per Tube Q4H  . levETIRAcetam  1,500 mg Per Tube BID  . nystatin   Topical TID  .  pantoprazole sodium  40 mg Per Tube Daily  . piperacillin-tazobactam (ZOSYN)  IV  3.375 g Intravenous Q8H  . polyvinyl alcohol  1 drop Both Eyes TID  . predniSONE  25 mg Per Tube Q breakfast  . senna  2 tablet Oral Daily  . vancomycin  1,000 mg Intravenous Q12H   Continuous Infusions: . 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  He will blink his eyes in response to yes/no questions.  I think infection is most likely given improvement on antibiotics and results of EEG.  He has remained afebrile on broad spectrum antibiotics to cover the Methicillin resistant Staph species growing in his blood cultures as well as the Proteus cultured from his urine.  Further speciation of blood cultures still pending.  Sensitivity of Proteus still pending.  WBC today is down to 10.  EEG that was done shows encephalopathy but no seizure activity. - suspect that his 1/2 positive blood cultures is likely contaminant given Staph species but not Staph aureus.  Will continue vancomycin for now while awaiting further identification.  Plan to discontinue vancomycin if CoNS and treat as contaminant.   - his urine culture has grown Proteus, previous cultures have been sensitive to Zosyn.  Continue Zosyn, narrow as further results become available. - repeat blood cultures today - follow WBC, fever curve  Seizure History: on Keppra 1gm BID at home  - continue current dose Keppra  - EEG results as above - neurology consulted   End-Stage MS: recent neurology notes state  no indication for disease modifying agents. Neurology notes also reflect patient wife and caretaker may have an unrealistic interpretation of his current abilities and belief he may possibly be blind.  - continue baclofen, dantrolene, tizanidine  - palliative care consult declined by family this morning  Failure to Thrive  -  tube feeds per nutrition   Diet: NPO   DVT PPx: Lovenox   Code: FULL   Dispo: Disposition is deferred at this time, awaiting improvement of current medical problems.  Anticipated discharge in approximately 1-2 day(s).   The patient does have a current PCP ([name], MD) and does not need an [facility] hospital follow-up appointment after discharge.  The patient does have transportation limitations that hinder transportation to clinic appointments.  .Services Needed at time of discharge: Y = Yes, Blank = No PT:   OT:   RN:   Equipment:   Other:     LOS: 2 days   Jule Ser, DO 05/27/2015, 8:43 AM

## 2015-05-27 NOTE — Procedures (Signed)
History: Terry Palmer is an 40 y.o. male patient with altered mental status and seizures. Routine inpatient EEG was performed for further evaluation.   Patient Active Problem List   Diagnosis Date Noted  . Bacteremia 05/27/2015  . Sepsis ([place]) 05/25/2015  . Altered mental state 05/25/2015  . Altered mental status 05/25/2015  . Fever, unspecified 03/04/2015  . Quadriplegia and quadriparesis ([place]) 12/24/2014  . 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[place]) 12/24/2014  . 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 Protein-calorie malnutrition, severe ([place]) 10/21/2014  . Sepsis secondary to UTI ([place]) 10/20/2014  . Leukocytosis 10/20/2014  . Acute respiratory failure with hypoxia ([place]) 10/20/2014  . Seizure disorder ([place]) 10/20/2014  . Aspiration pneumonitis ([place]) 10/20/2014  . Neurogenic bladder 05/28/2011  . Multiple sclerosis ([place]) 04/20/2011  . FTT (failure to thrive) in adult 04/20/2011  . Sacral decubitus ulcer, stage II 04/20/2011  . Quadriparesis (muscle weakness) ([place]) 03/12/2011     Current facility-administered medications:  .  acetaminophen (TYLENOL) tablet 500 mg, 500 mg, Oral, Q8H PRN, Tasrif Ahmed, MD .  albuterol (PROVENTIL) (2.5 MG/3ML) 0.083% nebulizer solution 2.5 mg, 2.5 mg, Nebulization, Q6H PRN, Jule Ser, DO, 2.5 mg at 05/26/15 1504 .  antiseptic oral rinse (CPC / CETYLPYRIDINIUM CHLORIDE 0.05%) solution 7 mL, 7 mL, Mouth Rinse, q12n4p, Sid Falcon, MD, 7 mL at 05/26/15 1600 .  baclofen (LIORESAL) tablet 20 mg, 20 mg, Per Tube, TID, Tasrif Ahmed, MD, 20 mg at 05/27/15 0908 .  bisacodyl (DULCOLAX) suppository 10 mg, 10 mg, Rectal, Once PRN, Tasrif Ahmed, MD .  buPROPion [facility]) tablet 75 mg, 75 mg, Per Tube, TID, Tasrif Ahmed, MD, 75 mg at 05/27/15 0908 .  chlorhexidine (PERIDEX) 0.12 % solution 15 mL, 15 mL, Mouth Rinse, BID, Sid Falcon, MD, 15 mL at 05/27/15 0909 .  dantrolene (DANTRIUM) capsule 200 mg, 200 mg, Oral, QHS, Tasrif Ahmed, MD, 200 mg at  05/26/15 2119 .  enoxaparin (LOVENOX) injection 40 mg, 40 mg, Subcutaneous, Q24H, Tasrif Ahmed, MD, 40 mg at 05/26/15 2119 .  feeding supplement (OSMOLITE 1.5 CAL) liquid 1,000 mL, 1,000 mL, Per Tube, Continuous, Rogue Bussing, RD, Last Rate: 80 mL/hr at 05/27/15 0654, 1,000 mL at 05/27/15 0654 .  feeding supplement (PRO-STAT SUGAR FREE 64) liquid 30 mL, 30 mL, Per Tube, Daily, Sid Falcon, MD, 30 mL at 05/27/15 0909 .  free water 240 mL, 240 mL, Per Tube, Q4H, Tasrif Ahmed, MD, 240 mL at 05/27/15 0843 .  HYDROcodone-acetaminophen (NORCO) 7.5-325 MG per tablet 1 tablet, 1 tablet, Per Tube, Daily PRN, Tasrif Ahmed, MD .  levETIRAcetam (KEPPRA) 100 MG/ML solution 1,500 mg, 1,500 mg, Per Tube, BID, Nathanie Ottley Fuller Mandril, MD, 1,500 mg at 05/27/15 0522 .  nystatin (MYCOSTATIN) powder, , Topical, TID, Sid Falcon, MD .  ondansetron [facility]) tablet 4 mg, 4 mg, Per Tube, Q6H PRN, Tasrif Ahmed, MD .  pantoprazole sodium (PROTONIX) 40 mg/20 mL oral suspension 40 mg, 40 mg, Per Tube, Daily, Tasrif Ahmed, MD, 40 mg at 05/27/15 0909 .  piperacillin-tazobactam (ZOSYN) IVPB 3.375 g, 3.375 g, Intravenous, Q8H, Wynelle Fanny, RPH, 3.375 g at 05/27/15 0522 .  polyvinyl alcohol (LIQUIFILM TEARS) 1.4 % ophthalmic solution 1 drop, 1 drop, Both Eyes, TID, Tasrif Ahmed, MD, 1 drop at 05/27/15 0909 .  predniSONE 5 MG/ML concentrated solution 25 mg, 25 mg, Per Tube, Q breakfast, Tasrif Ahmed, MD, 25 mg at 05/27/15 0843 .  senna (SENOKOT) tablet 17.2 mg, 2 tablet, Oral, Daily, Tasrif Ahmed,  MD, 17.2 mg at 05/27/15 0909 .  tiZANidine (ZANAFLEX) tablet 2 mg, 2 mg, Oral, Q6H PRN, Tasrif Ahmed, MD .  vancomycin (VANCOCIN) IVPB 1000 mg/200 mL premix, 1,000 mg, Intravenous, Q12H, Wynelle Fanny, RPH, 1,000 mg at 05/27/15 0000   Introduction:  This is a 19 channel routine scalp EEG performed at the bedside with bipolar and monopolar montages arranged in accordance to the international 10/20 system of  electrode placement. One channel was dedicated to EKG recording.   Findings:  Severe background delta slowing is noted throughout the recording. Patient is noted to have some chewing type of movements intermittently, no facial twitches noted during the recording  No definite evidence of abnormal epileptiform discharges or electrographic seizures were noted during this recording.   Impression:  Abnormal routine inpatient EEG suggestive of moderate to severe encephalopathy, no abnormal epilepsy from discharges or seizures noted. Clinical correlation is recommended .

## 2015-05-27 NOTE — Progress Notes (Signed)
Pharmacy Antibiotic Note  CASSIE FINCKE is a 40 y.o. male admitted on 05/25/2015 with sepsis.  Pharmacy has been consulted for Vancomycin dosing.  Vanc trough is below goal on current dose.  Cr is within range of this admission, and may be misleading due to quadriplegic status.  Repeat cultures to be drawn.  Plan: Change Vancomycin to 750mg  IV q8 Repeat Vanc trough at steady state F/U cultures and clinical status  Height: 6' (182.9 cm) Weight: 160 lb 15 oz (73 kg) IBW/kg (Calculated) : 77.6  Temp (24hrs), Avg:98.9 F (37.2 C), Min:98.5 F (36.9 C), Max:99.4 F (37.4 C)   Recent Labs Lab 05/25/15 1016 05/25/15 1023 05/25/15 1339 05/25/15 1830 05/26/15 0643 05/27/15 0906 05/27/15 1042  WBC 22.6*  --   --  22.8* 17.2* 10.0  --   CREATININE 0.75  --   --  0.60* 0.56* 0.64  --   LATICACIDVEN  --  3.07* 2.17*  --   --   --   --   VANCOTROUGH  --   --   --   --   --   --  11    Estimated Creatinine Clearance: 125.5 mL/min (by C-G formula based on Cr of 0.64).    No Known Allergies  Antimicrobials this admission: 5/17 vanc >>  5/17 Zosyn >>   Dose adjustments this admission: 5/19: VT 11 (on 1000mg  IV q12)  Microbiology results: 5/17 blood >> #2/2 BCID (+)Staph sp, meth resistance  5/17 urine >> (+)Proteus > 100K, sens (p)   Thank you for allowing pharmacy to be a part of this patient's care.  Jaquita Folds 05/27/2015 12:49 PM

## 2015-05-27 NOTE — Progress Notes (Signed)
Patient seen and examined. Case d/w residents in detail. I agree with findings and plan as documented in Dr. Alcario Drought note.  Patient remains afebrile. No acute overnight events. Leukocytosis has now resolved. Will c/w zosyn and vancomycin for now and f.u cultures. Urine cx growing proteus - awaiting susceptibilities and blood cx with 2/4 staph (? contaminant) - will f.u speciation. Will repeat blood cx today. EEG with no acute seizure activity. Neuro f/u appreciated. Will c/w keppra.   Patient with overall poor prognosis secondary to end stage MS. Family declined palliative care consult at this time.

## 2015-05-28 DIAGNOSIS — R569 Unspecified convulsions: Secondary | ICD-10-CM

## 2015-05-28 DIAGNOSIS — B964 Proteus (mirabilis) (morganii) as the cause of diseases classified elsewhere: Secondary | ICD-10-CM

## 2015-05-28 LAB — CBC
HCT: 37.9 % — ABNORMAL LOW (ref 39.0–52.0)
Hemoglobin: 12.4 g/dL — ABNORMAL LOW (ref 13.0–17.0)
MCH: 30 pg (ref 26.0–34.0)
MCHC: 32.7 g/dL (ref 30.0–36.0)
MCV: 91.5 fL (ref 78.0–100.0)
PLATELETS: 193 10*3/uL (ref 150–400)
RBC: 4.14 MIL/uL — AB (ref 4.22–5.81)
RDW: 12.8 % (ref 11.5–15.5)
WBC: 9.9 10*3/uL (ref 4.0–10.5)

## 2015-05-28 LAB — BASIC METABOLIC PANEL
Anion gap: 13 (ref 5–15)
BUN: 6 mg/dL (ref 6–20)
CALCIUM: 9 mg/dL (ref 8.9–10.3)
CO2: 24 mmol/L (ref 22–32)
CREATININE: 0.52 mg/dL — AB (ref 0.61–1.24)
Chloride: 101 mmol/L (ref 101–111)
Glucose, Bld: 98 mg/dL (ref 65–99)
Potassium: 3.6 mmol/L (ref 3.5–5.1)
SODIUM: 138 mmol/L (ref 135–145)

## 2015-05-28 LAB — GLUCOSE, CAPILLARY
GLUCOSE-CAPILLARY: 102 mg/dL — AB (ref 65–99)
GLUCOSE-CAPILLARY: 143 mg/dL — AB (ref 65–99)
GLUCOSE-CAPILLARY: 191 mg/dL — AB (ref 65–99)
GLUCOSE-CAPILLARY: 90 mg/dL (ref 65–99)
Glucose-Capillary: 92 mg/dL (ref 65–99)
Glucose-Capillary: 102 mg/dL — ABNORMAL HIGH (ref 65–99)
Glucose-Capillary: 156 mg/dL — ABNORMAL HIGH (ref 65–99)

## 2015-05-28 MED ORDER — OSMOLITE 1.5 CAL PO LIQD
1000.0000 mL | ORAL | Status: AC
  Administered 2015-05-28: 1000 mL
  Filled 2015-05-28 (×2): qty 1000

## 2015-05-28 MED ORDER — WHITE PETROLATUM GEL
Status: AC
  Administered 2015-05-28: 01:00:00
  Filled 2015-05-28: qty 1

## 2015-05-28 NOTE — Progress Notes (Signed)
Subjective: Patient seen and examined this morning.  No acute events overnight were noted.  He cannot provide any interval history but states that he is not in pain by answering no by blinking.   Objective: Vital signs in last 24 hours: Filed Vitals:   05/28/15 0500 05/28/15 0600 05/28/15 0740 05/28/15 0813  BP: 121/79 117/83 120/80 111/81  Pulse: 78 76 81 79  Temp:   98.6 F (37 C) 99.3 F (37.4 C)  TempSrc:   Axillary Axillary  Resp: 19 9 12 16   Height:      Weight:      SpO2: 100% 100% 100% 99%   Weight change: 7 lb 2.6 oz (3.25 kg)  Intake/Output Summary (Last 24 hours) at 05/28/15 1107 Last data filed at 05/28/15 1035  Gross per 24 hour  Intake   1510 ml  Output   2725 ml  Net  -1215 ml   General: lying in bed, will blink his eyes to questioning, opens eyes to voice HEENT: eyes open, positive corneal reflex, closes eyes to direct threat Cardiac: RRR, no murmur Pulm: effort normal Abd: G-tube in place with no surrounding erythema or drainage.  GU: suprapubic catheter in place with no surrounding erythema or drainage. Ext: flexion contractures of all 4 extremities Skin: + intertrigo Neuro: some response to yes/no questions but other times will just blink without questioning  Lab Results: Basic Metabolic Panel:  Recent Labs Lab 05/27/15 0906 05/28/15 0305  NA 140 138  K 3.5 3.6  CL 103 101  CO2 28 24  GLUCOSE 144* 98  BUN 6 6  CREATININE 0.64 0.52*  CALCIUM 8.8* 9.0   Liver Function Tests:  Recent Labs Lab 05/25/15 1016 05/26/15 0643  AST 23 25  ALT 24 20  ALKPHOS 89 68  BILITOT 0.4 0.7  PROT 7.5 6.2*  ALBUMIN 4.2 3.3*   CBC:  Recent Labs Lab 05/25/15 1016  05/27/15 0906 05/28/15 0305  WBC 22.6*  < > 10.0 9.9  NEUTROABS 20.3*  --   --   --   HGB 14.0  < > 12.2* 12.4*  HCT 42.9  < > 37.8* 37.9*  MCV 93.9  < > 92.2 91.5  PLT 238  < > 183 193  < > = values in this interval not displayed. Urine Drug Screen: Drugs of Abuse       Component Value Date/Time   LABOPIA NONE DETECTED 10/20/2014 1817   COCAINSCRNUR NONE DETECTED 10/20/2014 1817   LABBENZ POSITIVE* 10/20/2014 1817   AMPHETMU NONE DETECTED 10/20/2014 1817   THCU NONE DETECTED 10/20/2014 1817   LABBARB NONE DETECTED 10/20/2014 1817    Urinalysis:  Recent Labs Lab 05/25/15 1019  COLORURINE YELLOW  LABSPEC 1.025  PHURINE 8.5*  GLUCOSEU NEGATIVE  HGBUR NEGATIVE  BILIRUBINUR NEGATIVE  KETONESUR NEGATIVE  PROTEINUR 30*  NITRITE NEGATIVE  LEUKOCYTESUR LARGE*     Micro Results: Recent Results (from the past 240 hour(s))  Blood Culture (routine x 2)     Status: None (Preliminary result)   Collection Time: 05/25/15 10:16 AM  Result Value Ref Range Status   Specimen Description BLOOD RIGHT HAND  Final   Special Requests IN PEDIATRIC BOTTLE 0.5CC  Final   Culture NO GROWTH 3 DAYS  Final   Report Status PENDING  Incomplete  Urine culture     Status: Abnormal (Preliminary result)   Collection Time: 05/25/15 10:19 AM  Result Value Ref Range Status   Specimen Description URINE, SUPRAPUBIC  Final  Special Requests NONE  Final   Culture (A)  Final    >=100,000 COLONIES/mL PROTEUS MIRABILIS SUSCEPTIBILITIES TO FOLLOW    Report Status PENDING  Incomplete  Blood Culture (routine x 2)     Status: Abnormal (Preliminary result)   Collection Time: 05/25/15 10:40 AM  Result Value Ref Range Status   Specimen Description BLOOD LEFT HAND  Final   Special Requests BOTTLES DRAWN AEROBIC AND ANAEROBIC 5CC  Final   Culture  Setup Time   Final    GRAM POSITIVE COCCI IN CLUSTERS IN BOTH AEROBIC AND ANAEROBIC BOTTLES Organism ID to follow CRITICAL RESULT CALLED TO, READ BACK BY AND VERIFIED WITH: CARON AMEND @0630  05/26/15 MKELLY    Culture STAPHYLOCOCCUS SPECIES (COAGULASE NEGATIVE) (A)  Final   Report Status PENDING  Incomplete  Blood Culture ID Panel (Reflexed)     Status: Abnormal   Collection Time: 05/25/15 10:40 AM  Result Value Ref Range Status    Enterococcus species NOT DETECTED NOT DETECTED Final   Vancomycin resistance NOT DETECTED NOT DETECTED Final   Listeria monocytogenes NOT DETECTED NOT DETECTED Final   Staphylococcus species DETECTED (A) NOT DETECTED Final    Comment: CARON AMEND @0630  05/26/15 MKELLY   Staphylococcus aureus NOT DETECTED NOT DETECTED Final   Methicillin resistance DETECTED (A) NOT DETECTED Final    Comment: CARON AMEND @0630  05/26/15 MKELLY   Streptococcus species NOT DETECTED NOT DETECTED Final   Streptococcus agalactiae NOT DETECTED NOT DETECTED Final   Streptococcus pneumoniae NOT DETECTED NOT DETECTED Final   Streptococcus pyogenes NOT DETECTED NOT DETECTED Final   Acinetobacter baumannii NOT DETECTED NOT DETECTED Final   Enterobacteriaceae species NOT DETECTED NOT DETECTED Final   Enterobacter cloacae complex NOT DETECTED NOT DETECTED Final   Escherichia coli NOT DETECTED NOT DETECTED Final   Klebsiella oxytoca NOT DETECTED NOT DETECTED Final   Klebsiella pneumoniae NOT DETECTED NOT DETECTED Final   Proteus species NOT DETECTED NOT DETECTED Final   Serratia marcescens NOT DETECTED NOT DETECTED Final   Carbapenem resistance NOT DETECTED NOT DETECTED Final   Haemophilus influenzae NOT DETECTED NOT DETECTED Final   Neisseria meningitidis NOT DETECTED NOT DETECTED Final   Pseudomonas aeruginosa NOT DETECTED NOT DETECTED Final   Candida albicans NOT DETECTED NOT DETECTED Final   Candida glabrata NOT DETECTED NOT DETECTED Final   Candida krusei NOT DETECTED NOT DETECTED Final   Candida parapsilosis NOT DETECTED NOT DETECTED Final   Candida tropicalis NOT DETECTED NOT DETECTED Final  MRSA PCR Screening     Status: None   Collection Time: 05/25/15  9:07 PM  Result Value Ref Range Status   MRSA by PCR NEGATIVE NEGATIVE Final    Comment:        The GeneXpert MRSA Assay (FDA approved for NASAL specimens only), is one component of a comprehensive MRSA colonization surveillance program. It is  not intended to diagnose MRSA infection nor to guide or monitor treatment for MRSA infections.   Culture, blood (routine x 2)     Status: None (Preliminary result)   Collection Time: 05/27/15 10:55 AM  Result Value Ref Range Status   Specimen Description BLOOD LEFT HAND  Final   Special Requests BOTTLES DRAWN AEROBIC ONLY 8CC  Final   Culture NO GROWTH < 24 HOURS  Final   Report Status PENDING  Incomplete  Culture, blood (routine x 2)     Status: None (Preliminary result)   Collection Time: 05/27/15 11:03 AM  Result Value Ref Range Status  Specimen Description BLOOD RIGHT HAND  Final   Special Requests IN PEDIATRIC BOTTLE 4CC  Final   Culture NO GROWTH < 24 HOURS  Final   Report Status PENDING  Incomplete   Studies/Results: No results found. Medications: I have reviewed the patient's current medications. Scheduled Meds: . antiseptic oral rinse  7 mL Mouth Rinse q12n4p  . baclofen  20 mg Per Tube TID  . buPROPion  75 mg Per Tube TID  . chlorhexidine  15 mL Mouth Rinse BID  . dantrolene  200 mg Oral QHS  . enoxaparin (LOVENOX) injection  40 mg Subcutaneous Q24H  . feeding supplement (PRO-STAT SUGAR FREE 64)  30 mL Per Tube Daily  . free water  240 mL Per Tube Q4H  . levETIRAcetam  1,500 mg Per Tube BID  . nystatin   Topical TID  . pantoprazole sodium  40 mg Per Tube Daily  . piperacillin-tazobactam (ZOSYN)  IV  3.375 g Intravenous Q8H  . polyvinyl alcohol  1 drop Both Eyes TID  . predniSONE  25 mg Per Tube Q breakfast  . senna  2 tablet Oral Daily  . vancomycin  750 mg Intravenous Q8H   Continuous Infusions: . 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 However, I think this is still skin flora/contaminant from stick. Ucx growing proteus, sensitivities pending.  EEG was negative.    WBC today is down to 10.  EEG that was done shows encephalopathy but no seizure activity. WBC trending down to 9.9 today.  - cont zosyn to treat for proteus until full sensitivity comes back, narrow abx appropriately.  - repeat blood cultures from 5/19.  - follow WBC, fever curve  Seizure History: on Keppra 1gm BID at home  - continue current dose Keppra 1.5gm bid per neuro rec - EEG results as above  End-Stage MS: recent neurology notes state no indication for disease modifying agents. Neurology notes also reflect patient wife and caretaker may have an unrealistic interpretation of his current abilities and belief he may possibly be blind.  - continue baclofen, dantrolene, tizanidine  - palliative care consult declined by family this morning  Failure to Thrive  - tube feeds per nutrition   Diet: NPO . Tube feeds.   DVT PPx: Lovenox   Code: FULL   Dispo: Disposition is deferred at this time, awaiting improvement of current medical problems.  Anticipated discharge in approximately 1-2 day(s).   The patient does have a current PCP ([name], MD) and does not need an [facility] hospital follow-up appointment after discharge.  The patient does have transportation limitations that hinder transportation to clinic appointments.  .Services Needed at time of discharge: Y = Yes, Blank = No PT:   OT:   RN:   Equipment:   Other:     LOS: 3 days   Dellia Nims, MD 05/28/2015, 11:07 AM

## 2015-05-28 NOTE — Progress Notes (Signed)
Patient accepted to 5C20. No signs or pain or discomfort observed

## 2015-05-28 NOTE — Progress Notes (Signed)
Internal Medicine Attending:   I saw and examined the patient. I reviewed the resident's note and I agree with the resident's findings and plan as documented in the resident's note.  Clinically stable today, afebrile since 5/17. Urine culture is growing Proteus, awaiting sensitivities. One of 2 blood cultures from admission is growing coagulase negative staph. Repeat blood cultures on 5/19 are no growth to date. This seems most consistent with a contaminant. I agree with de-escalating antibiotics to Zosyn only until Proteus susceptibilities return. Otherwise the patient is tolerating tube feeds well, no further seizure-like activity. Would plan to transfer out of step down to the floor today.

## 2015-05-29 DIAGNOSIS — B9689 Other specified bacterial agents as the cause of diseases classified elsewhere: Secondary | ICD-10-CM

## 2015-05-29 DIAGNOSIS — G35 Multiple sclerosis: Secondary | ICD-10-CM

## 2015-05-29 DIAGNOSIS — G9340 Encephalopathy, unspecified: Secondary | ICD-10-CM | POA: Insufficient documentation

## 2015-05-29 DIAGNOSIS — N390 Urinary tract infection, site not specified: Secondary | ICD-10-CM

## 2015-05-29 DIAGNOSIS — G40909 Epilepsy, unspecified, not intractable, without status epilepticus: Secondary | ICD-10-CM

## 2015-05-29 LAB — CULTURE, BLOOD (ROUTINE X 2)

## 2015-05-29 LAB — GLUCOSE, CAPILLARY
Glucose-Capillary: 87 mg/dL (ref 65–99)
Glucose-Capillary: 94 mg/dL (ref 65–99)
Glucose-Capillary: 105 mg/dL — ABNORMAL HIGH (ref 65–99)

## 2015-05-29 MED ORDER — OSMOLITE 1.5 CAL PO LIQD: 80.0000 mL/h | ORAL | Status: DC

## 2015-05-29 MED ORDER — AMOXICILLIN 875 MG PO TABS: 875.0000 mg | ORAL_TABLET | Freq: Two times a day (BID) | ORAL | Status: DC

## 2015-05-29 MED ORDER — FLUCONAZOLE 150 MG PO TABS: 150.0000 mg | ORAL_TABLET | Freq: Every day | ORAL | Status: DC

## 2015-05-29 MED ORDER — LEVETIRACETAM 500 MG PO TABS: 1500.0000 mg | ORAL_TABLET | Freq: Two times a day (BID) | ORAL | Status: DC

## 2015-05-29 MED ORDER — CIPROFLOXACIN HCL 500 MG PO TABS: 500.0000 mg | ORAL_TABLET | Freq: Two times a day (BID) | ORAL | Status: DC

## 2015-05-29 NOTE — Progress Notes (Addendum)
CM received call from RN to please arrange for [facility] transport home as pt is discharged.  CM met with pt to notify him of plan for ambulance transport home (pt follows with eyes but is non-verbal); CM called spouse, Terry Palmer,  [phone] to verify address and ability to receive pt; spouse confirms.  CM called [facility].  RN aware.  No other CM needs were communicated.

## 2015-05-29 NOTE — Discharge Summary (Signed)
Name: Terry Palmer: [id number] DOB: [birth year] 40 y.o. PCP: [name], MD  Date of Admission: 05/25/2015 10:01 AM Date of Discharge: 05/29/2015 Attending Physician: Sid Falcon, MD  Discharge Diagnosis: 1. Polymicrobial Urinary Tract Infection 2. History of Seizures 3. End-Stage Multiple Sclerosis   Active Problems:   FTT (failure to thrive) in adult   Neurogenic bladder   Leukocytosis   Seizure disorder (HCC)   Protein-calorie malnutrition, severe (HCC)   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HCC)   Sepsis ([place])   Altered mental state   Altered mental status   Bacteremia   Encephalopathy  Discharge Medications:   Medication List    TAKE these medications        acetaminophen 500 MG tablet  Commonly known as:  TYLENOL  Place 1,000 mg into feeding tube 2 (two) times daily.     acetaminophen 500 MG tablet  Commonly known as:  TYLENOL  Take 1 tablet (500 mg total) by mouth every 8 (eight) hours as needed for fever (or pain).     ACIDOPHILUS PO  Place 1 capsule into feeding tube daily.     albuterol (2.5 MG/3ML) 0.083% nebulizer solution  Commonly known as:  PROVENTIL  Take 2.5 mg by nebulization every 6 (six) hours as needed for wheezing or shortness of breath.     amoxicillin 875 MG tablet  Commonly known as:  AMOXIL  Place 1 tablet (875 mg total) into feeding tube 2 (two) times daily.     vitamin C 500 MG tablet  Commonly known as:  ASCORBIC ACID  Place 500 mg into feeding tube 2 (two) times daily.     ascorbic acid 500 MG/5ML syrup  Commonly known as:  VITAMIN C  Place 5 mLs (500 mg total) into feeding tube 2 (two) times daily.     baclofen 20 MG tablet  Commonly known as:  LIORESAL  Place 1 tablet (20 mg total) into feeding tube 3 (three) times daily.     bisacodyl 10 MG suppository  Commonly known as:  DULCOLAX  Place 10 mg rectally once as needed for moderate constipation.     buPROPion 75 MG tablet  Commonly known as:  WELLBUTRIN   Place 1 tablet (75 mg total) into feeding tube 3 (three) times daily.     cholecalciferol 1000 units tablet  Commonly known as:  VITAMIN D  Take 1 tablet (1,000 Units total) by mouth 2 (two) times daily.     ciprofloxacin 500 MG tablet  Commonly known as:  CIPRO  Place 1 tablet (500 mg total) into feeding tube 2 (two) times daily.     Cranberry Juice Powder 425 MG Caps  1 capsule (425 mg total) by PEG Tube route 2 (two) times daily.     dantrolene 50 MG capsule  Commonly known as:  DANTRIUM  Give 200 mg by tube at bedtime.     DESITIN EX  Apply 1 application topically as needed (may apply to minor skin breakdown until healed).     diazepam 5 MG tablet  Commonly known as:  VALIUM  Take 5 mg by mouth daily as needed for anxiety.     feeding supplement (OSMOLITE 1.5 CAL) Liqd  Place 80 mL/hr into feeding tube See admin instructions. For 14 hours a day     fexofenadine 180 MG tablet  Commonly known as:  ALLEGRA  Place 180 mg into feeding tube daily.     fluconazole 150 MG tablet  Commonly known as:  DIFLUCAN  Place 1 tablet (150 mg total) into feeding tube daily.     free water Soln  Place 240 mLs into feeding tube every 4 (four) hours.     Garlic Oil [id number] MG Caps  Give 1,000 mg by tube daily with breakfast.     HYDROcodone-acetaminophen 7.5-325 MG tablet  Commonly known as:  [place] 1 tablet into feeding tube daily as needed (pain).     ipratropium-albuterol 0.5-2.5 (3) MG/3ML Soln  Commonly known as:  DUONEB  Take 3 mLs by nebulization 2 (two) times daily.     levETIRAcetam 500 MG tablet  Commonly known as:  KEPPRA  Take 3 tablets (1,500 mg total) by mouth 2 (two) times daily.     magnesium hydroxide 400 MG/5ML suspension  Commonly known as:  MILK OF MAGNESIA  Take 30 mLs by mouth every 12 (twelve) hours as needed for mild constipation.     multivitamin with minerals Tabs tablet  Place 1 tablet into feeding tube daily.     NALTREXONE HCL PO  Give 4.5  mg by tube at bedtime.     neomycin-bacitracin-polymyxin [phone] ointment  Apply 1 application topically daily as needed (for toes).     nystatin powder  Generic drug:  nystatin  Apply 1 g topically 3 (three) times daily.     omeprazole 2 mg/mL Susp  Commonly known as:  PRILOSEC  Place 20 mLs (40 mg total) into feeding tube daily.     ondansetron 4 MG tablet  Commonly known as:  ZOFRAN  Place 1 tablet (4 mg total) into feeding tube every 6 (six) hours as needed for nausea.     pantoprazole sodium 40 mg/20 mL Pack  Commonly known as:  PROTONIX  Place 20 mLs (40 mg total) into feeding tube daily.     polyethylene glycol packet  Commonly known as:  MIRALAX / GLYCOLAX  Place 17 g into feeding tube daily.     polyvinyl alcohol 1.4 % ophthalmic solution  Commonly known as:  LIQUIFILM TEARS  Place 1 drop into both eyes 3 (three) times daily.     predniSONE 5 MG/ML concentrated solution  Place 2 mLs (10 mg total) into feeding tube daily with breakfast.     senna 8.6 MG Tabs tablet  Commonly known as:  SENOKOT  Take 2 tablets by mouth daily.     SIMPLY SALINE 0.9 % Aers  Generic drug:  Saline  Place 1 spray into both nostrils as needed (for nasal conjestion).     sodium phosphate enema  Commonly known as:  FLEET  Place 1 enema rectally once as needed (for constipation). follow package directions     tiZANidine 2 MG tablet  Commonly known as:  ZANAFLEX  Take 1 tablet (2 mg total) by mouth every 6 (six) hours as needed for muscle spasms.        Disposition and follow-up:   Terry Palmer was discharged from [facility] in Stable condition.    1.  At the hospital follow up visit please address:  - patient completed course of antibiotics as detailed below - anti-epileptic regimen as detailed below - goals of care discussion with family regarding his end-stage MS  2.  Labs / imaging needed at time of follow-up: CBC  3.  Pending labs/ test  needing follow-up: urine cx, blood cx  Follow-up Appointments:   Discharge Instructions:   Consultations:    Procedures Performed:  Ct Head Wo Contrast  05/25/2015  CLINICAL DATA:  Altered mental status. End-stage multiple sclerosis. EXAM: CT HEAD WITHOUT CONTRAST TECHNIQUE: Contiguous axial images were obtained from the base of the skull through the vertex without intravenous contrast. COMPARISON:  03/12/2011 FINDINGS: Skull and Sinuses:Negative for fracture or destructive process. The visualized mastoids, middle ears, and imaged paranasal sinuses are clear. 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 Despite interval development the arthropathy has chronic features and no surrounding soft tissue inflammation. Visualized orbits: Dysconjugate gaze.  No acute finding. Brain: Severe atrophy with ventriculomegaly. There is confluent cerebral white matter low density with markedly thinned white matter. No signs of acute infarct or hemorrhage. 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 No acute intracranial finding. Electronically Signed   By: Monte Fantasia M.D.   On: 05/25/2015 14:57   Dg Chest Port 1 View  05/25/2015  CLINICAL DATA:  Fevers EXAM: PORTABLE CHEST 1 VIEW COMPARISON:  03/03/2015 FINDINGS: Cardiac shadow is within normal limits. The lungs are well aerated bilaterally. No focal infiltrate or sizable effusion is seen. No acute bony abnormality is noted. IMPRESSION: No active disease. Electronically Signed   By: Inez Catalina M.D.   On: 05/25/2015 11:32    2D Echo: none  Cardiac Cath: none  Admission HPI:  Terry Palmer is a 40 y.o. man with PMHx significant for end-stage multiple sclerosis, quadriplegia & quadriparesis, dementia d/t MS, aspiration PNA, recurrent UTI, and seizures who presents to the ED for altered mental status and fever. Patient lives at home with wife and has a caregiver.  Much of the history was obtained from wife and mother as the patient is non-verbal. At his baseline, family reports that patient will blink his eyes once if answer to question is "yes" and will stare off if the answer is "no". He also will reportedly smile but does not move his eyes. His mother reports that she feels within the last 6 weeks or so his eyes have become more deviated. He has no movement of his extremities and has contractures of arms and legs. Reportedly, last night he had some twitching of his lips noticed by his wife. This morning she noticed that he was lying in his own emesis as well as having had a bowel movement in the bed. She does not think that he had any tongue-biting. She also thought he looked lethargic and was not communicating as he normally does. She checked an axillary temp and it was 100.8. She feels that his urine has looked a dark yellow recently but has not noticed any pus surrounding his suprapubic catheter site. She says he has a special mattress and is turned frequently and has no current skin issues. She does report that he has been belching more frequently over the past few days.   Hospital Course by problem list: Active Problems:   FTT (failure to thrive) in adult   Neurogenic bladder   Leukocytosis   Seizure disorder (HCC)   Protein-calorie malnutrition, severe (HCC)   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[place])   Sepsis ([place])   Altered mental state   Altered mental status   Bacteremia   Encephalopathy   Polymicrobial UTI: Terry Palmer was admitted with fever, AMS, & leukocytosis due to a complicated and polymicrobial urinary tract infection.  His suprapubic indwelling Foley catheter was changed recently prior to admission.  He did very well on broad spectrum anti-biotics with piperacillin-tazobactam with  resolution of his fever and normalization of his leukocytosis.  Although difficult to assess given his end-stage MS, his mental status appeared to  improve to baseline per family report.  His admission blood cultures were positive in 1 of 2 cultures for CoNS and this was treated as a contaminant.  Additionally, his repeat blood cultures were negative.  Due to the polymicrobial urine culture and resistance profile of the organisms isolated, patient was discharged home on ciprofloxacin to treat Proteus and Amoxicillin to treat Enterococcus.  He will complete a 14 day course through May 30.  Follow up with his PCP.  Seizure History: on Keppra 1gm BID at home prior to admission.  Neurology was consulted due to concern for seizure activity due to his presentation.  Neurology recommended EEG and Keppra dosing to 1.5gm BID.  His EEG showed encephalopathy but no seizure activity.  He was discharged on continued Keppra dosing.  Follow up with his neurology recommended.  End-Stage MS: recent neurology notes state no indication for disease modifying agents. Neurology notes also reflect patient wife and caretaker may have an unrealistic interpretation of his current abilities and believe he may possibly be blind.  We continued baclofen, dantrolene, tizanidine as well as consulted neurology, mainly for anti-epileptic recommendations.  The family declined our offer to have our Palliative Care team come discuss goals of care options with them.  Recommend these discussions continue to be had for this patient.  Discharge Vitals:   BP 97/65 mmHg  Pulse 83  Temp(Src) 98.6 F (37 C) (Oral)  Resp 14  Ht 6' (1.829 m)  Wt 168 lb 1.6 oz (76.25 kg)  BMI 22.79 kg/m2  SpO2 98%  Discharge Labs:  Results for orders placed or performed during the hospital encounter of 05/25/15 (from the past 24 hour(s))  Glucose, capillary     Status: Abnormal   Collection Time: 05/28/15  4:04 PM  Result Value Ref Range   Glucose-Capillary 191 (H) 65 - 99 mg/dL  Glucose, capillary     Status: Abnormal   Collection Time: 05/28/15  8:01 PM  Result Value Ref Range   Glucose-Capillary  143 (H) 65 - 99 mg/dL  Glucose, capillary     Status: Abnormal   Collection Time: 05/28/15 11:23 PM  Result Value Ref Range   Glucose-Capillary 156 (H) 65 - 99 mg/dL  Glucose, capillary     Status: None   Collection Time: 05/29/15  6:13 AM  Result Value Ref Range   Glucose-Capillary 87 65 - 99 mg/dL  Glucose, capillary     Status: None   Collection Time: 05/29/15  8:15 AM  Result Value Ref Range   Glucose-Capillary 94 65 - 99 mg/dL  Glucose, capillary     Status: Abnormal   Collection Time: 05/29/15 11:06 AM  Result Value Ref Range   Glucose-Capillary 105 (H) 65 - 99 mg/dL    Signed: Jule Ser, DO 05/29/2015, 12:06 PM

## 2015-05-29 NOTE — Progress Notes (Signed)
[facility] arrived to take patient home. Mother(POA) is called and informed that Terry Palmer is enroute. Discharge documents given to rep for delivery

## 2015-05-29 NOTE — Discharge Instructions (Signed)
We treated [facility] for a urinary tract infection.  He had 2 different bacteria growing in his urine and so we are sending him home with 2 different antibiotics:  1. Ciprofloxacin 500 mg twice per day 2. Augmentin 500mg  twice per day.  Take the first dose tonight.  You will take this medication through May 30.  Also, the neurologist that saw Terry Palmer while he was here adjusted his dose of Keppra to be 1500mg  BID.  Please continue with this dosing.  Please follow up with your primary doctor and neurologist for his MS.

## 2015-05-29 NOTE — Progress Notes (Addendum)
Subjective: Patient seen and examined this morning.  No acute events overnight were noted.  He cannot provide any interval history.  Objective: Vital signs in last 24 hours: Filed Vitals:   05/28/15 1751 05/28/15 2100 05/29/15 0114 05/29/15 0538  BP: 111/82 111/73 116/73 109/75  Pulse: 80 88 90 83  Temp: 98.3 F (36.8 C) 99 F (37.2 C) 99.3 F (37.4 C) 99.5 F (37.5 C)  TempSrc: Axillary Axillary Axillary Axillary  Resp: 14 12 14 12   Height:      Weight:      SpO2: 100% 100% 96% 98%   Weight change:   Intake/Output Summary (Last 24 hours) at 05/29/15 0928 Last data filed at 05/29/15 0538  Gross per 24 hour  Intake      0 ml  Output   3500 ml  Net  -3500 ml   General: lying in bed, will blink his eyes to questioning, opens eyes to voice HEENT: eyes open, blinking eyes to yes/no questioning Cardiac: RRR, no murmur Pulm: effort normal Abd: G-tube in place with no surrounding erythema or drainage.  GU: suprapubic catheter in place with no surrounding erythema or drainage. Ext: flexion contractures of all 4 extremities Neuro: some response to yes/no questions but other times will just blink without questioning  Lab Results: Basic Metabolic Panel:  Recent Labs Lab 05/27/15 0906 05/28/15 0305  NA 140 138  K 3.5 3.6  CL 103 101  CO2 28 24  GLUCOSE 144* 98  BUN 6 6  CREATININE 0.64 0.52*  CALCIUM 8.8* 9.0   Liver Function Tests:  Recent Labs Lab 05/25/15 1016 05/26/15 0643  AST 23 25  ALT 24 20  ALKPHOS 89 68  BILITOT 0.4 0.7  PROT 7.5 6.2*  ALBUMIN 4.2 3.3*   CBC:  Recent Labs Lab 05/25/15 1016  05/27/15 0906 05/28/15 0305  WBC 22.6*  < > 10.0 9.9  NEUTROABS 20.3*  --   --   --   HGB 14.0  < > 12.2* 12.4*  HCT 42.9  < > 37.8* 37.9*  MCV 93.9  < > 92.2 91.5  PLT 238  < > 183 193  < > = values in this interval not displayed. Urine Drug Screen: Drugs of Abuse     Component Value Date/Time   LABOPIA NONE DETECTED 10/20/2014 1817   COCAINSCRNUR NONE DETECTED 10/20/2014 1817   LABBENZ POSITIVE* 10/20/2014 1817   AMPHETMU NONE DETECTED 10/20/2014 1817   THCU NONE DETECTED 10/20/2014 1817   LABBARB NONE DETECTED 10/20/2014 1817    Urinalysis:  Recent Labs Lab 05/25/15 1019  COLORURINE YELLOW  LABSPEC 1.025  PHURINE 8.5*  GLUCOSEU NEGATIVE  HGBUR NEGATIVE  BILIRUBINUR NEGATIVE  KETONESUR NEGATIVE  PROTEINUR 30*  NITRITE NEGATIVE  LEUKOCYTESUR LARGE*     Micro Results: Recent Results (from the past 240 hour(s))  Blood Culture (routine x 2)     Status: None (Preliminary result)   Collection Time: 05/25/15 10:16 AM  Result Value Ref Range Status   Specimen Description BLOOD RIGHT HAND  Final   Special Requests IN PEDIATRIC BOTTLE 0.5CC  Final   Culture NO GROWTH 3 DAYS  Final   Report Status PENDING  Incomplete  Urine culture     Status: Abnormal (Preliminary result)   Collection Time: 05/25/15 10:19 AM  Result Value Ref Range Status   Specimen Description URINE, SUPRAPUBIC  Final   Special Requests NONE  Final   Culture (A)  Final    >=100,000 COLONIES/mL PROTEUS  MIRABILIS >=100,000 COLONIES/mL ENTEROCOCCUS FAECALIS CULTURE REINCUBATED FOR BETTER GROWTH    Report Status PENDING  Incomplete   Organism ID, Bacteria PROTEUS MIRABILIS (A)  Final   Organism ID, Bacteria ENTEROCOCCUS FAECALIS (A)  Final      Susceptibility   Proteus mirabilis - MIC*    AMPICILLIN >=32 RESISTANT Resistant     CEFAZOLIN 8 SENSITIVE Sensitive     CEFTRIAXONE <=1 SENSITIVE Sensitive     CIPROFLOXACIN 1 SENSITIVE Sensitive     GENTAMICIN >=16 RESISTANT Resistant     IMIPENEM 2 SENSITIVE Sensitive     NITROFURANTOIN 128 RESISTANT Resistant     TRIMETH/SULFA >=320 RESISTANT Resistant     AMPICILLIN/SULBACTAM >=32 RESISTANT Resistant     PIP/TAZO <=4 SENSITIVE Sensitive     * >=100,000 COLONIES/mL PROTEUS MIRABILIS   Enterococcus faecalis - MIC*    AMPICILLIN <=2 SENSITIVE Sensitive     LEVOFLOXACIN >=8 RESISTANT  Resistant     NITROFURANTOIN <=16 SENSITIVE Sensitive     VANCOMYCIN 1 SENSITIVE Sensitive     * >=100,000 COLONIES/mL ENTEROCOCCUS FAECALIS  Blood Culture (routine x 2)     Status: Abnormal (Preliminary result)   Collection Time: 05/25/15 10:40 AM  Result Value Ref Range Status   Specimen Description BLOOD LEFT HAND  Final   Special Requests BOTTLES DRAWN AEROBIC AND ANAEROBIC 5CC  Final   Culture  Setup Time   Final    GRAM POSITIVE COCCI IN CLUSTERS IN BOTH AEROBIC AND ANAEROBIC BOTTLES Organism ID to follow CRITICAL RESULT CALLED TO, READ BACK BY AND VERIFIED WITH: CARON AMEND @0630  05/26/15 MKELLY    Culture STAPHYLOCOCCUS SPECIES (COAGULASE NEGATIVE) (A)  Final   Report Status PENDING  Incomplete  Blood Culture ID Panel (Reflexed)     Status: Abnormal   Collection Time: 05/25/15 10:40 AM  Result Value Ref Range Status   Enterococcus species NOT DETECTED NOT DETECTED Final   Vancomycin resistance NOT DETECTED NOT DETECTED Final   Listeria monocytogenes NOT DETECTED NOT DETECTED Final   Staphylococcus species DETECTED (A) NOT DETECTED Final    Comment: CARON AMEND @0630  05/26/15 MKELLY   Staphylococcus aureus NOT DETECTED NOT DETECTED Final   Methicillin resistance DETECTED (A) NOT DETECTED Final    Comment: CARON AMEND @0630  05/26/15 MKELLY   Streptococcus species NOT DETECTED NOT DETECTED Final   Streptococcus agalactiae NOT DETECTED NOT DETECTED Final   Streptococcus pneumoniae NOT DETECTED NOT DETECTED Final   Streptococcus pyogenes NOT DETECTED NOT DETECTED Final   Acinetobacter baumannii NOT DETECTED NOT DETECTED Final   Enterobacteriaceae species NOT DETECTED NOT DETECTED Final   Enterobacter cloacae complex NOT DETECTED NOT DETECTED Final   Escherichia coli NOT DETECTED NOT DETECTED Final   Klebsiella oxytoca NOT DETECTED NOT DETECTED Final   Klebsiella pneumoniae NOT DETECTED NOT DETECTED Final   Proteus species NOT DETECTED NOT DETECTED Final   Serratia marcescens  NOT DETECTED NOT DETECTED Final   Carbapenem resistance NOT DETECTED NOT DETECTED Final   Haemophilus influenzae NOT DETECTED NOT DETECTED Final   Neisseria meningitidis NOT DETECTED NOT DETECTED Final   Pseudomonas aeruginosa NOT DETECTED NOT DETECTED Final   Candida albicans NOT DETECTED NOT DETECTED Final   Candida glabrata NOT DETECTED NOT DETECTED Final   Candida krusei NOT DETECTED NOT DETECTED Final   Candida parapsilosis NOT DETECTED NOT DETECTED Final   Candida tropicalis NOT DETECTED NOT DETECTED Final  MRSA PCR Screening     Status: None   Collection Time: 05/25/15  9:07 PM  Result Value Ref Range Status   MRSA by PCR NEGATIVE NEGATIVE Final    Comment:        The GeneXpert MRSA Assay (FDA approved for NASAL specimens only), is one component of a comprehensive MRSA colonization surveillance program. It is not intended to diagnose MRSA infection nor to guide or monitor treatment for MRSA infections.   Culture, blood (routine x 2)     Status: None (Preliminary result)   Collection Time: 05/27/15 10:55 AM  Result Value Ref Range Status   Specimen Description BLOOD LEFT HAND  Final   Special Requests BOTTLES DRAWN AEROBIC ONLY 8CC  Final   Culture NO GROWTH < 24 HOURS  Final   Report Status PENDING  Incomplete  Culture, blood (routine x 2)     Status: None (Preliminary result)   Collection Time: 05/27/15 11:03 AM  Result Value Ref Range Status   Specimen Description BLOOD RIGHT HAND  Final   Special Requests IN PEDIATRIC BOTTLE 4CC  Final   Culture NO GROWTH < 24 HOURS  Final   Report Status PENDING  Incomplete   Studies/Results: No results found. Medications: I have reviewed the patient's current medications. Scheduled Meds: . antiseptic oral rinse  7 mL Mouth Rinse q12n4p  . baclofen  20 mg Per Tube TID  . buPROPion  75 mg Per Tube TID  . chlorhexidine  15 mL Mouth Rinse BID  . dantrolene  200 mg Oral QHS  . enoxaparin (LOVENOX) injection  40 mg Subcutaneous  Q24H  . feeding supplement (PRO-STAT SUGAR FREE 64)  30 mL Per Tube Daily  . free water  240 mL Per Tube Q4H  . levETIRAcetam  1,500 mg Per Tube BID  . nystatin   Topical TID  . pantoprazole sodium  40 mg Per Tube Daily  . piperacillin-tazobactam (ZOSYN)  IV  3.375 g Intravenous Q8H  . polyvinyl alcohol  1 drop Both Eyes TID  . predniSONE  25 mg Per Tube Q breakfast  . 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  His 1 out of 2 blood cultures grew out CoNS, treated as a contaminant.  Repeat blood cultures from 5/19 are no growth.  His urine culture is polymicrobial.  Growing Proteus and Enterococcus with differing susceptibilities.  He appears to be clinically stable nonetheless and has been afebrile since 5/17 with a now normalized WBC on labs from 5/20.  EEG was negative.  - scale back antibiotics - Cipro 500mg  Q12H to treat Proteus - Amoxicillin 875mg Q12H to treat Enterococcus - plan for 14 day courses (end date 06/07/15) - repeat blood cultures from 5/19.  - follow WBC, fever curve  Seizure History: on Keppra 1gm BID at home  - continue current dose Keppra 1.5gm BID per neuro recs - EEG results as above  End-Stage MS: recent neurology notes state no indication for disease modifying agents. Neurology notes also reflect patient wife and caretaker may have an unrealistic interpretation of his current abilities and believe  he may possibly be blind.  - continue  baclofen, dantrolene, tizanidine  - palliative care consult declined by family this admission  Failure to Thrive  - tube feeds per nutrition   Diet: NPO . Tube feeds.   DVT PPx: Lovenox   Code: FULL   Dispo: Home today.  Spoke with patients mother (POA) who expressed ability to accept him home today and will be able to pick up appropriate antibiotics.   The patient does have a current PCP ([name], MD) and does not need an [facility] hospital follow-up appointment after discharge.  The patient does have transportation limitations that hinder transportation to clinic appointments.  .Services Needed at time of discharge: Y = Yes, Blank = No PT:   OT:   RN:   Equipment:   Other:     LOS: 4 days   Jule Ser, DO 05/29/2015, 9:28 AM

## 2015-05-30 LAB — CULTURE, BLOOD (ROUTINE X 2): Culture: NO GROWTH

## 2015-05-31 LAB — URINE CULTURE

## 2015-06-01 LAB — CULTURE, BLOOD (ROUTINE X 2)
CULTURE: NO GROWTH
Culture: NO GROWTH

## 2015-06-26 IMAGING — CR DG FOOT 2V*L*
2 series · 2 of 2 positions shown · non-contrast
Comparison: None.

CLINICAL DATA: Left heel ulcer.

EXAM:
LEFT FOOT - 2 VIEW

[x foot lat left]
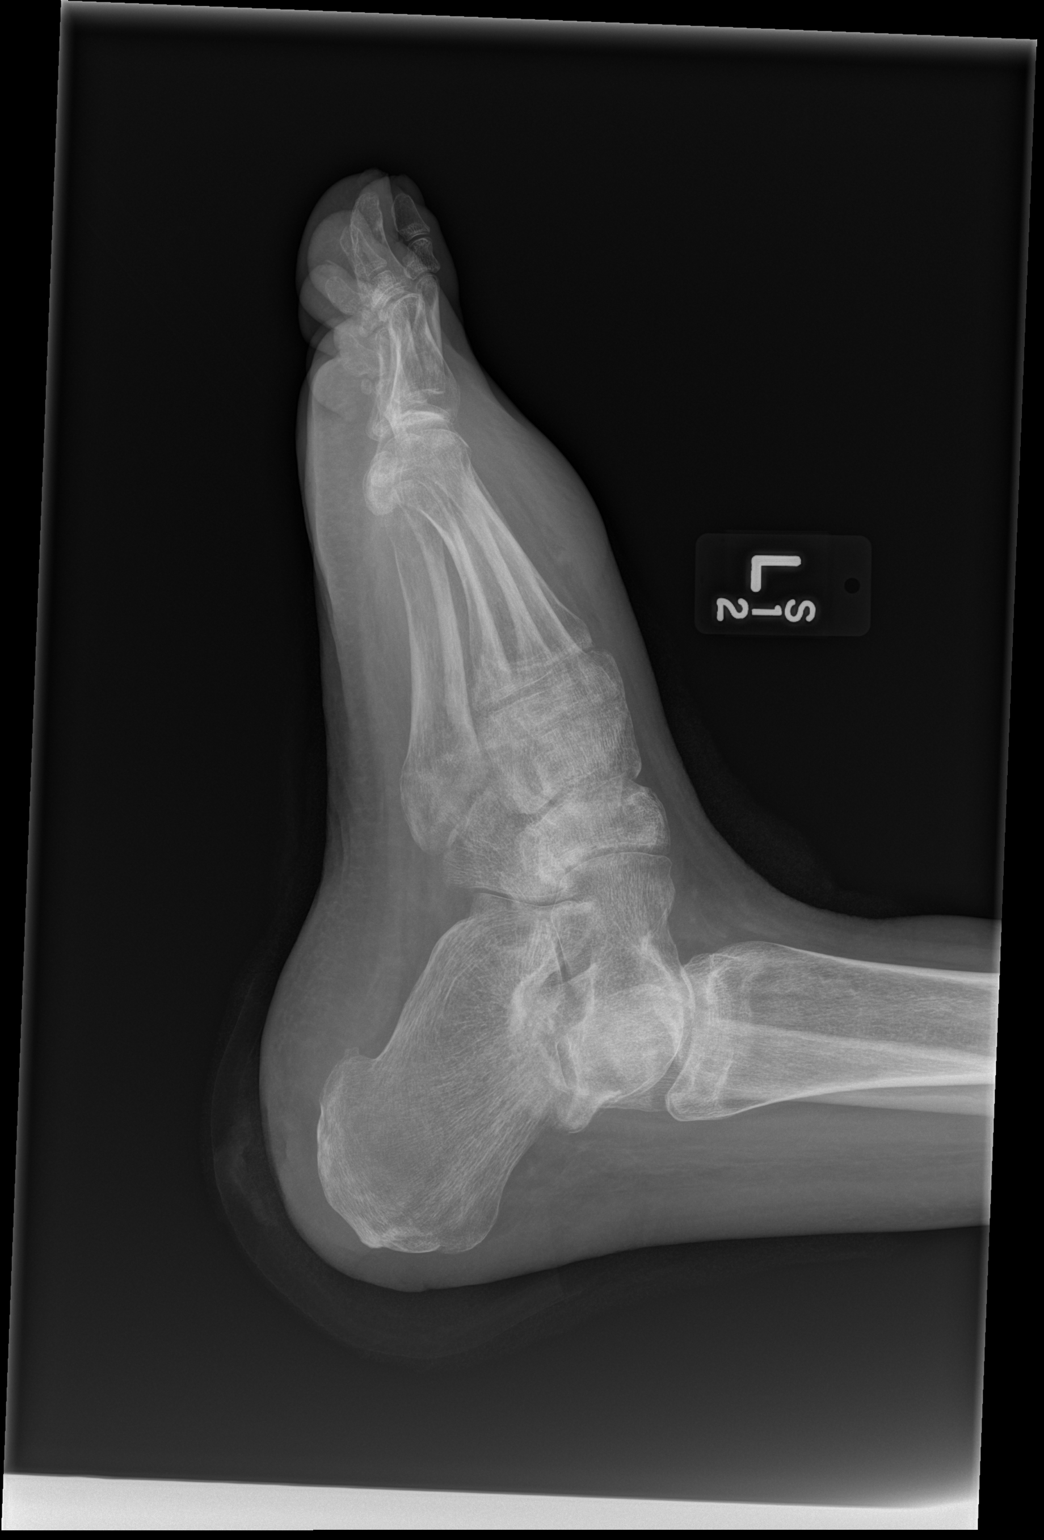

[x foot ap left]
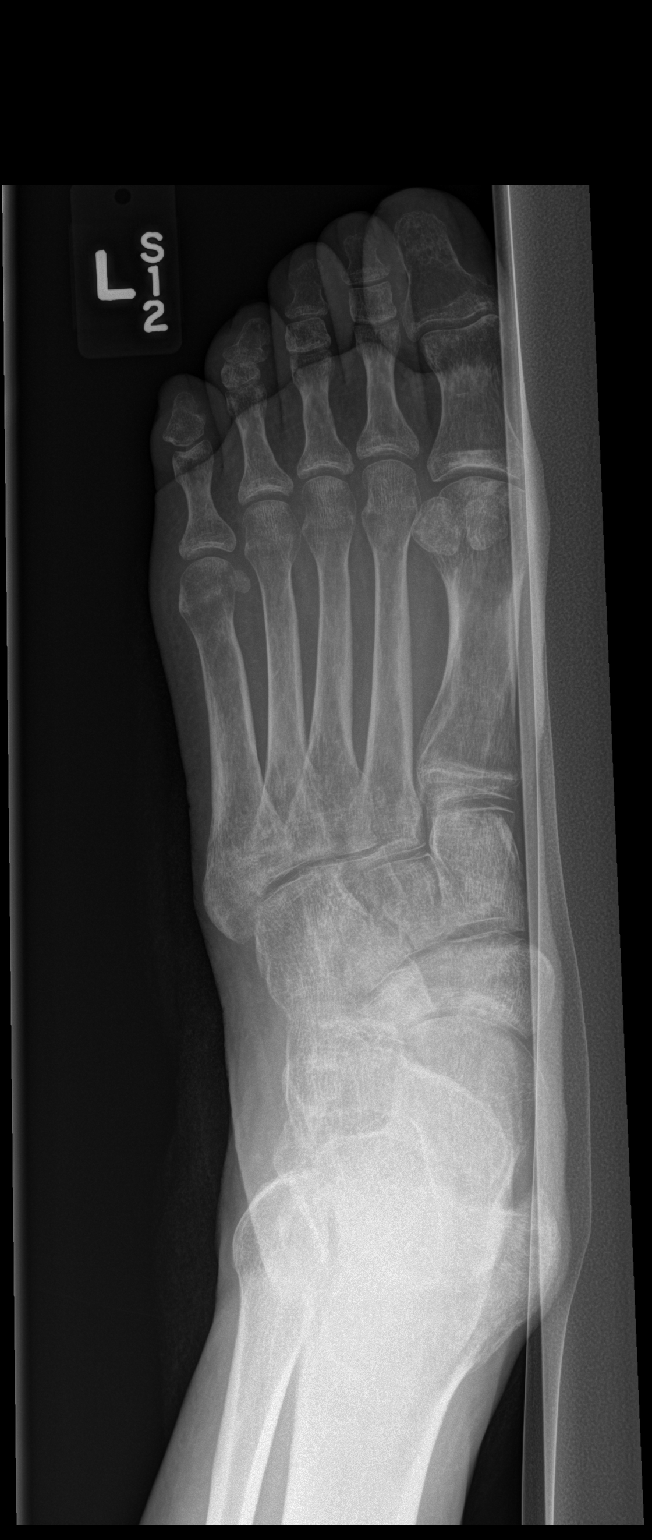

[2 of 2 positions shown; findings below may reference images not displayed]

FINDINGS: Small ulcer/ soft tissue defect noted within the heel soft tissues.
No underlying acute bony abnormality. No evidence of osteomyelitis.

Soft tissue swelling along the dorsum of the foot overlying the
metatarsals. No underlying acute bony abnormality. No fracture,
subluxation or dislocation.
IMPRESSION: No acute bony abnormality.

## 2015-07-05 ENCOUNTER — Other Ambulatory Visit: Payer: Self-pay | Admitting: *Deleted

## 2015-07-13 ENCOUNTER — Other Ambulatory Visit: Payer: Self-pay | Admitting: Neurology

## 2015-07-13 MED ORDER — BACLOFEN 20 MG PO TABS: 20.0000 mg | ORAL_TABLET | Freq: Four times a day (QID) | ORAL | Status: DC

## 2015-07-13 NOTE — Telephone Encounter (Signed)
Checking the last office note, pt was taking baclofen 20mg  po 4tabs/daily.  (ordered 3x daily by another MD).

## 2015-07-13 NOTE — Telephone Encounter (Signed)
Patient requesting refill of  baclofen (LIORESAL) 20 MG tablet with new directions- he is taking 4 tabs/day. Pharmacy: [facility]

## 2015-07-13 NOTE — Telephone Encounter (Signed)
Rx printed, signed, faxed to pharmacy. 

## 2015-07-28 ENCOUNTER — Other Ambulatory Visit: Payer: Self-pay | Admitting: *Deleted

## 2015-07-28 MED ORDER — TIZANIDINE HCL 2 MG PO TABS: 2.0000 mg | ORAL_TABLET | Freq: Four times a day (QID) | ORAL | Status: DC | PRN

## 2015-07-31 ENCOUNTER — Encounter (HOSPITAL_COMMUNITY): Payer: Self-pay | Admitting: Emergency Medicine

## 2015-07-31 ENCOUNTER — Emergency Department (HOSPITAL_COMMUNITY): Payer: Medicare HMO

## 2015-07-31 ENCOUNTER — Emergency Department (HOSPITAL_COMMUNITY)
Admission: EM | Admit: 2015-07-31 | Discharge: 2015-08-01 | Disposition: A | Discharge status: Home / Self Care | Payer: Medicare HMO | Attending: Emergency Medicine | Admitting: Emergency Medicine

## 2015-07-31 DIAGNOSIS — K9423 Gastrostomy malfunction: Secondary | ICD-10-CM | POA: Insufficient documentation

## 2015-07-31 DIAGNOSIS — Z87891 Personal history of nicotine dependence: Secondary | ICD-10-CM | POA: Diagnosis not present

## 2015-07-31 DIAGNOSIS — J45909 Unspecified asthma, uncomplicated: Secondary | ICD-10-CM | POA: Insufficient documentation

## 2015-07-31 DIAGNOSIS — Z431 Encounter for attention to gastrostomy: Secondary | ICD-10-CM | POA: Diagnosis present

## 2015-07-31 MED ORDER — DIATRIZOATE MEGLUMINE & SODIUM 66-10 % PO SOLN
ORAL | Status: AC
  Filled 2015-07-31: qty 30

## 2015-07-31 MED ORDER — DIATRIZOATE MEGLUMINE & SODIUM 66-10 % PO SOLN
30.0000 mL | Freq: Once | ORAL | Status: DC
  Filled 2015-07-31: qty 30

## 2015-07-31 NOTE — ED Notes (Signed)
Patient denies pain and is resting comfortably.  

## 2015-07-31 NOTE — Discharge Instructions (Signed)
You were seen and evaluated today for your clogged PEG tube.  Please, follow up outpatient as needed.  Use your PEG tube as you were before.  Return for inability to use the tube.

## 2015-07-31 NOTE — ED Triage Notes (Signed)
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 Here with clogged gtube. Pt unable to receive 4pm medications. Pt at baseline per family. Denies recent illness or fever. VSS.

## 2015-07-31 NOTE — ED Provider Notes (Signed)
[facility] DEPT Provider Note   CSN: [id number] Arrival date & time: 07/31/15  1919  First Provider Contact:  First MD Initiated Contact with Patient 07/31/15 1941        History   Chief Complaint Chief Complaint  Patient presents with  . Other    gtube clogged    HPI Terry Palmer is a 40 y.o. male.  40 year old male with complicated medical history including quadriplegia due to MS, nonverbal, dysphasia treated with G-tube comes in with his wife for clogged G-tube. The patient had his PEG tube replaced on Wednesday. Yesterday the wife reports she had some difficulty with a clogging but was able to unclog it. Today it again clogged but she has not been able to unclog it even with using coke and trying to flush it at home. Patient has not been able to receive his medications at home because of this. He otherwise has been in his normal health.      Past Medical History:  Diagnosis Date  . Aspiration pneumonia ([place]) 01/20/2013  . Bladder calculi   . Childhood asthma   . 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[place]) 12/24/2014  . Depression   . Dysphagia   . 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 MS (multiple sclerosis) ([place])   . Neurogenic bladder   . Neuromuscular disorder ([place])    Quadraperesis  . Normocytic anemia 05/28/2011  . Quadriparesis (muscle weakness) ([place]) 03/12/2011  . Quadriplegia and quadriparesis ([place]) 12/24/2014  . Recurrent upper respiratory infection (URI)   . Recurrent UTI   . Shortness of breath     Patient Active Problem List   Diagnosis Date Noted  . Encephalopathy   . Bacteremia 05/27/2015  . Sepsis ([place]) 05/25/2015  . Altered mental state 05/25/2015  . Altered mental status 05/25/2015  . Fever, unspecified 03/04/2015  . Quadriplegia and quadriparesis ([place]) 12/24/2014  . 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[place]) 12/24/2014  . 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 Protein-calorie malnutrition, severe ([place]) 10/21/2014  . Sepsis secondary to  UTI ([place]) 10/20/2014  . Leukocytosis 10/20/2014  . Acute respiratory failure with hypoxia ([place]) 10/20/2014  . Seizure disorder ([place]) 10/20/2014  . Aspiration pneumonitis ([place]) 10/20/2014  . Neurogenic bladder 05/28/2011  . Multiple sclerosis ([place]) 04/20/2011  . FTT (failure to thrive) in adult 04/20/2011  . Sacral decubitus ulcer, stage II 04/20/2011  . Quadriparesis (muscle weakness) ([place]) 03/12/2011    Past Surgical History:  Procedure Laterality Date  . GASTROSTOMY  04/16/2011   Procedure: GASTROSTOMY;  Surgeon: Zenovia Jarred, MD;  Location: [facility];  Service: General;  Laterality: N/A;  Open G-Tube placement  . LUMBAR PUNCTURE  10/12/2002       Home Medications    Prior to Admission medications   Medication Sig Start Date End Date Taking? Authorizing Provider  acetaminophen (TYLENOL) 500 MG tablet Place 1,000 mg into feeding tube 2 (two) times daily.    Yes Historical Provider, MD  acetaminophen (TYLENOL) 500 MG tablet Take 1 tablet (500 mg total) by mouth every 8 (eight) hours as needed for fever (or pain). 03/05/15  Yes Debbe Odea, MD  albuterol (PROVENTIL) (2.5 MG/3ML) 0.083% nebulizer solution Take 2.5 mg by nebulization every 6 (six) hours as needed for wheezing or shortness of breath.   Yes Historical Provider, MD  baclofen (LIORESAL) 20 MG tablet Place 1 tablet (20 mg total) into feeding tube 4 (four) times daily. 07/13/15  Yes Kathrynn Ducking, MD  bisacodyl (DULCOLAX) 10 MG suppository Place 10 mg  rectally once as needed for moderate constipation.    Yes Historical Provider, MD  buPROPion (WELLBUTRIN) 75 MG tablet Place 1 tablet (75 mg total) into feeding tube 3 (three) times daily. 01/24/13  Yes Bonnielee Haff, MD  cholecalciferol (VITAMIN D) 1000 UNITS tablet Take 1 tablet (1,000 Units total) by mouth 2 (two) times daily. Patient taking differently: Give 5,000 Units by tube daily.  01/24/13  Yes Bonnielee Haff, MD  Cranberry Juice Powder 425 MG CAPS 1 capsule (425 mg  total) by PEG Tube route 2 (two) times daily. 01/24/13  Yes Bonnielee Haff, MD  dantrolene (DANTRIUM) 50 MG capsule Give 200 mg by tube at bedtime.    Yes Historical Provider, MD  Diaper Rash Products (DESITIN EX) Apply 1 application topically as needed (may apply to minor skin breakdown until healed).   Yes Historical Provider, MD  diazepam (VALIUM) 5 MG tablet Take 5 mg by mouth daily as needed for anxiety.    Yes Historical Provider, MD  fexofenadine (ALLEGRA) 180 MG tablet Place 180 mg into feeding tube daily.   Yes Historical Provider, MD  Garlic Oil 123XX123 MG CAPS Give 1,000 mg by tube daily with breakfast.    Yes Historical Provider, MD  HYDROcodone-acetaminophen (NORCO) 7.5-325 MG per tablet Place 1 tablet into feeding tube daily as needed (pain).    Yes Historical Provider, MD  ipratropium-albuterol (DUONEB) 0.5-2.5 (3) MG/3ML SOLN Take 3 mLs by nebulization 2 (two) times daily.   Yes Historical Provider, MD  Lactobacillus (ACIDOPHILUS PO) Place 1 capsule into feeding tube daily.    Yes Historical Provider, MD  levETIRAcetam (KEPPRA) 500 MG tablet Take 3 tablets (1,500 mg total) by mouth 2 (two) times daily. 05/29/15  Yes Jule Ser, DO  magnesium hydroxide (MILK OF MAGNESIA) 400 MG/5ML suspension Take 30 mLs by mouth every 12 (twelve) hours as needed for mild constipation.   Yes Historical Provider, MD  Multiple Vitamin (MULTIVITAMIN WITH MINERALS) TABS tablet Place 1 tablet into feeding tube daily. 01/24/13  Yes Bonnielee Haff, MD  NALTREXONE HCL PO Give 4.5 mg by tube at bedtime.   Yes Historical Provider, MD  neomycin-bacitracin-polymyxin (NEOSPORIN) [phone] ointment Apply 1 application topically daily as needed (for toes).   Yes Historical Provider, MD  Nutritional Supplements (FEEDING SUPPLEMENT, OSMOLITE 1.5 CAL,) LIQD Place 80 mL/hr into feeding tube See admin instructions. For 14 hours a day 05/29/15  Yes Jule Ser, DO  nystatin (MYCOSTATIN/NYSTOP) 100000 UNIT/GM POWD Apply 1 g  topically 3 (three) times daily. Patient taking differently: Apply 1 g topically daily. Applies to groin 01/24/13  Yes Bonnielee Haff, MD  omeprazole (PRILOSEC) 2 mg/mL SUSP Place 20 mLs (40 mg total) into feeding tube daily. 01/24/13  Yes Bonnielee Haff, MD  polyethylene glycol (MIRALAX / GLYCOLAX) packet Place 17 g into feeding tube daily. 01/24/13  Yes Bonnielee Haff, MD  polyvinyl alcohol (LIQUIFILM TEARS) 1.4 % ophthalmic solution Place 1 drop into both eyes 3 (three) times daily. Patient taking differently: Place 1 drop into both eyes 2 (two) times daily.  01/24/13  Yes Bonnielee Haff, MD  predniSONE 5 MG/ML concentrated solution Place 2 mLs (10 mg total) into feeding tube daily with breakfast. Patient taking differently: Place 25 mg into feeding tube daily with breakfast.  11/02/14  Yes Robbie Lis, MD  Saline (SIMPLY SALINE) 0.9 % AERS Place 1 spray into both nostrils as needed (for nasal conjestion).   Yes Historical Provider, MD  senna (SENOKOT) 8.6 MG TABS tablet Take 2 tablets by  mouth daily.   Yes Historical Provider, MD  sodium phosphate (FLEET) enema Place 1 enema rectally once as needed (for constipation). follow package directions   Yes Historical Provider, MD  tiZANidine (ZANAFLEX) 2 MG tablet Take 1 tablet (2 mg total) by mouth every 6 (six) hours as needed for muscle spasms. 07/28/15  Yes Kathrynn Ducking, MD  vitamin C (ASCORBIC ACID) 500 MG tablet Place 500 mg into feeding tube 2 (two) times daily.   Yes Historical Provider, MD  Water For Irrigation, Sterile (FREE WATER) SOLN Place 240 mLs into feeding tube every 4 (four) hours. 01/24/13  Yes Bonnielee Haff, MD    Family History Family History  Problem Relation Age of Onset  . Asthma Mother     Social History Social History  Substance Use Topics  . Smoking status: Former Smoker    Types: Cigarettes    Quit date: 02/02/1999  . Smokeless tobacco: Former Systems developer  . Alcohol use No     Allergies   Review of patient's  allergies indicates no known allergies.   Review of Systems Review of Systems  Unable to perform ROS: Patient nonverbal     Physical Exam Updated Vital Signs BP 116/76 (BP Location: Right Arm)   Pulse 92   Temp 98.3 F (36.8 C) (Axillary)   Resp 19   SpO2 99%   Physical Exam  Constitutional: No distress.  HENT:  Head: Normocephalic and atraumatic.  Eyes: Conjunctivae are normal. Right eye exhibits no discharge. Left eye exhibits no discharge.  Neck: Normal range of motion. Neck supple.  Cardiovascular: Normal rate.   Pulmonary/Chest: Effort normal.  Abdominal: Soft. He exhibits no distension. There is no tenderness.  60 French PEG tube in place but unable to be flushed  Skin: Skin is warm and dry. Capillary refill takes less than 2 seconds. He is not diaphoretic.  Vitals reviewed.    ED Treatments / Results  Labs (all labs ordered are listed, but only abnormal results are displayed) Labs Reviewed - No data to display  EKG  EKG Interpretation None       Radiology Dg Abd Portable 1v  Result Date: 07/31/2015 CLINICAL DATA:  40 year old male with a percutaneous gastrostomy. Evaluate for positioning. EXAM: PORTABLE ABDOMEN - 1 VIEW COMPARISON:  Abdominal CT dated 03/03/2015 FINDINGS: A percutaneous gastrostomy is noted in the left hemiabdomen. Contrast injected through the tube opacifies the stomach. Air is noted throughout the colon. IMPRESSION: Percutaneous gastrostomy with tip in the stomach. Electronically Signed   By: Anner Crete M.D.   On: 07/31/2015 22:21   Procedures FEEDING TUBE REPLACEMENT Date/Time: 07/31/2015 9:51 PM Performed by: Lonia Skinner ROE Authorized by: Harvel Quale  Consent: Verbal consent obtained. Written consent not obtained. Risks and benefits: risks, benefits and alternatives were discussed Consent given by: spouse Patient identity confirmed: arm band Indications: tube malfunction Local anesthesia used:  no  Anesthesia: Local anesthesia used: no  Sedation: Patient sedated: no Tube type: gastrostomy Procedure type: replacement Tube size: 18 Fr Endoscope used: no Bulb inflation volume: 6 (ml) Placement/position confirmation: auscultation and x-ray Tube placement difficulty: none Patient tolerance: Patient tolerated the procedure well with no immediate complications    (including critical care time)  Medications Ordered in ED Medications  diatrizoate meglumine-sodium (GASTROGRAFIN) 66-10 % solution (not administered)  diatrizoate meglumine-sodium (GASTROGRAFIN) 66-10 % solution 30 mL (not administered)     Initial Impression / Assessment and Plan / ED Course  I have reviewed the triage vital signs and  the nursing notes.  Pertinent labs & imaging results that were available during my care of the patient were reviewed by me and considered in my medical decision making (see chart for details).  Clinical Course    Patient was seen and evaluated in stable condition.  We were unable to de-clog the PEG tube.  PEG tube was exchanged without difficulty.  Patient was discharged home in stable condition in the care of his wife.  Final Clinical Impressions(s) / ED Diagnoses   Final diagnoses:  Gastrostomy tube dysfunction [facility])    New Prescriptions New Prescriptions   No medications on file     Harvel Quale, MD 07/31/15 2302

## 2015-07-31 NOTE — ED Notes (Signed)
Called [facility] @ [phone]

## 2015-08-01 ENCOUNTER — Emergency Department (HOSPITAL_COMMUNITY)
Admission: EM | Admit: 2015-08-01 | Discharge: 2015-08-01 | Disposition: A | Discharge status: Home / Self Care | Payer: Medicare HMO | Source: Home / Self Care | Attending: Emergency Medicine | Admitting: Emergency Medicine

## 2015-08-01 ENCOUNTER — Encounter (HOSPITAL_COMMUNITY): Payer: Self-pay | Admitting: Emergency Medicine

## 2015-08-01 DIAGNOSIS — J45909 Unspecified asthma, uncomplicated: Secondary | ICD-10-CM | POA: Insufficient documentation

## 2015-08-01 DIAGNOSIS — Z7952 Long term (current) use of systemic steroids: Secondary | ICD-10-CM

## 2015-08-01 DIAGNOSIS — Z87891 Personal history of nicotine dependence: Secondary | ICD-10-CM

## 2015-08-01 DIAGNOSIS — Z79899 Other long term (current) drug therapy: Secondary | ICD-10-CM

## 2015-08-01 DIAGNOSIS — K9423 Gastrostomy malfunction: Secondary | ICD-10-CM

## 2015-08-01 NOTE — ED Provider Notes (Signed)
[facility] DEPT Provider Note   CSN: [id number] Arrival date & time: 08/01/15  1330  First Provider Contact:  First MD Initiated Contact with Patient 08/01/15 1447     History   Chief Complaint Chief Complaint  Patient presents with  . Other    G tube displacement    HPI   Terry Palmer is an 40 y.o. male with history of MS-induced quadriplegia who presents to the ED with his wife for evaluation of G tube displacement. Home health aid apparently found the G-tube out of place around 11:30 this morning and put it back in place. She did not pump the balloon up. Pt is here to rule out infection. He has otherwise been acting at baseline. History is provided by EMS and wife. Pt has not had any fever, emesis, or behavior different from baseline.   Past Medical History:  Diagnosis Date  . Aspiration pneumonia ([place]) 01/20/2013  . Bladder calculi   . Childhood asthma   . 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 Depression   . Dysphagia   . 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 MS (multiple sclerosis) ([place])   . Neurogenic bladder   . Neuromuscular disorder ([place])    Quadraperesis  . Normocytic anemia 05/28/2011  . Quadriparesis (muscle weakness) ([place]) 03/12/2011  . Quadriplegia and quadriparesis ([place]) 12/24/2014  . Recurrent upper respiratory infection (URI)   . Recurrent UTI   . Shortness of breath     Patient Active Problem List   Diagnosis Date Noted  . Encephalopathy   . Bacteremia 05/27/2015  . Sepsis ([place]) 05/25/2015  . Altered mental state 05/25/2015  . Altered mental status 05/25/2015  . Fever, unspecified 03/04/2015  . Quadriplegia and quadriparesis ([place]) 12/24/2014  . 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 Protein-calorie malnutrition, severe ([place]) 10/21/2014  . Sepsis secondary to UTI ([place]) 10/20/2014  . Leukocytosis 10/20/2014  . Acute respiratory failure with hypoxia ([place]) 10/20/2014  .  Seizure disorder ([place]) 10/20/2014  . Aspiration pneumonitis ([place]) 10/20/2014  . Neurogenic bladder 05/28/2011  . Multiple sclerosis ([place]) 04/20/2011  . FTT (failure to thrive) in adult 04/20/2011  . Sacral decubitus ulcer, stage II 04/20/2011  . Quadriparesis (muscle weakness) ([place]) 03/12/2011    Past Surgical History:  Procedure Laterality Date  . GASTROSTOMY  04/16/2011   Procedure: GASTROSTOMY;  Surgeon: Zenovia Jarred, MD;  Location: [facility];  Service: General;  Laterality: N/A;  Open G-Tube placement  . LUMBAR PUNCTURE  10/12/2002       Home Medications    Prior to Admission medications   Medication Sig Start Date End Date Taking? Authorizing Provider  acetaminophen (TYLENOL) 500 MG tablet Take 1 tablet (500 mg total) by mouth every 8 (eight) hours as needed for fever (or pain). Patient taking differently: Take 1,000 mg by mouth 2 (two) times daily.  03/05/15  Yes Debbe Odea, MD  albuterol (PROVENTIL) (2.5 MG/3ML) 0.083% nebulizer solution Take 2.5 mg by nebulization every 6 (six) hours as needed for wheezing or shortness of breath.   Yes Historical Provider, MD  baclofen (LIORESAL) 20 MG tablet Place 1 tablet (20 mg total) into feeding tube 4 (four) times daily. 07/13/15  Yes Kathrynn Ducking, MD  bisacodyl (DULCOLAX) 10 MG suppository Place 10 mg rectally once as needed for moderate constipation.   Yes Historical Provider, MD  buPROPion (WELLBUTRIN) 75 MG tablet Place 1 tablet (75 mg total) into feeding tube 3 (three) times  daily. 01/24/13  Yes Bonnielee Haff, MD  cholecalciferol (VITAMIN D) 1000 UNITS tablet Take 1 tablet (1,000 Units total) by mouth 2 (two) times daily. Patient taking differently: Give 5,000 Units by tube daily.  01/24/13  Yes Bonnielee Haff, MD  CRANBERRY PO 425 mg by PEG Tube route 2 (two) times daily.   Yes Historical Provider, MD  dantrolene (DANTRIUM) 50 MG capsule Give 200 mg by tube at bedtime.    Yes Historical Provider, MD  diazepam (VALIUM) 5 MG tablet  Place 5 mg into feeding tube at bedtime as needed for anxiety (sleep).    Yes Historical Provider, MD  fexofenadine (ALLEGRA) 180 MG tablet Place 180 mg into feeding tube daily.   Yes Historical Provider, MD  Garlic Oil [id number] MG CAPS Give 1,000 mg by tube daily with breakfast.    Yes Historical Provider, MD  HYDROcodone-acetaminophen (NORCO) 7.5-325 MG per tablet Place 1 tablet into feeding tube daily as needed (pain).    Yes Historical Provider, MD  ipratropium-albuterol (DUONEB) 0.5-2.5 (3) MG/3ML SOLN Take 3 mLs by nebulization 2 (two) times daily.   Yes Historical Provider, MD  Lactobacillus (ACIDOPHILUS PO) Place 1 capsule into feeding tube daily.    Yes Historical Provider, MD  levETIRAcetam (KEPPRA) 100 MG/ML solution Take 1,000 mg by mouth 2 (two) times daily.   Yes Historical Provider, MD  liver oil-zinc oxide (DESITIN) 40 % ointment Apply 1 application topically 4 (four) times daily as needed for irritation.   Yes Historical Provider, MD  magnesium hydroxide (MILK OF MAGNESIA) 400 MG/5ML suspension Take 30 mLs by mouth daily as needed for mild constipation.   Yes Historical Provider, MD  Multiple Vitamin (MULTIVITAMIN WITH MINERALS) TABS tablet Place 1 tablet into feeding tube daily. 01/24/13  Yes Bonnielee Haff, MD  NALTREXONE HCL PO Give 4.5 mg by tube at bedtime.   Yes Historical Provider, MD  neomycin-bacitracin-polymyxin (NEOSPORIN) [phone] ointment Apply 1 application topically 4 (four) times daily as needed (abraisions, irritations).   Yes Historical Provider, MD  Nutritional Supplements (FEEDING SUPPLEMENT, OSMOLITE 1.5 CAL,) LIQD Place 80 mL/hr into feeding tube See admin instructions. For 14 hours a day Patient taking differently: Place 75 mL/hr into feeding tube See admin instructions. For 14 hours a day 05/29/15  Yes Jule Ser, DO  nystatin (MYCOSTATIN/NYSTOP) 100000 UNIT/GM POWD Apply 1 g topically 3 (three) times daily. Patient taking differently: Apply 1 g topically  daily. Applies to groin 01/24/13  Yes Bonnielee Haff, MD  omeprazole (PRILOSEC) 2 mg/mL SUSP Place 20 mLs (40 mg total) into feeding tube daily. 01/24/13  Yes Bonnielee Haff, MD  polyethylene glycol (MIRALAX / GLYCOLAX) packet Place 17 g into feeding tube daily. Patient taking differently: Place 17 g into feeding tube every evening.  01/24/13  Yes Bonnielee Haff, MD  polyvinyl alcohol (LIQUIFILM TEARS) 1.4 % ophthalmic solution Place 1 drop into both eyes 3 (three) times daily. Patient taking differently: Place 1 drop into both eyes 2 (two) times daily.  01/24/13  Yes Bonnielee Haff, MD  predniSONE 5 MG/ML concentrated solution Place 2 mLs (10 mg total) into feeding tube daily with breakfast. Patient taking differently: Place 25 mg into feeding tube daily with breakfast.  11/02/14  Yes Robbie Lis, MD  Saline (SIMPLY SALINE) 0.9 % AERS Place 1 spray into both nostrils as needed (for nasal conjestion).   Yes Historical Provider, MD  senna (SENOKOT) 8.6 MG TABS tablet Take 2 tablets by mouth daily.   Yes Historical Provider, MD  sodium  phosphate (FLEET) enema Place 1 enema rectally once as needed (for constipation). follow package directions   Yes Historical Provider, MD  tiZANidine (ZANAFLEX) 2 MG tablet Take 1 tablet (2 mg total) by mouth every 6 (six) hours as needed for muscle spasms. 07/28/15  Yes Kathrynn Ducking, MD  vitamin C (ASCORBIC ACID) 500 MG tablet Place 500 mg into feeding tube 2 (two) times daily.   Yes Historical Provider, MD  Water For Irrigation, Sterile (FREE WATER) SOLN Place 240 mLs into feeding tube every 4 (four) hours. 01/24/13  Yes Bonnielee Haff, MD  Cranberry Juice Powder 425 MG CAPS 1 capsule (425 mg total) by PEG Tube route 2 (two) times daily. Patient not taking: Reported on 08/01/2015 01/24/13   Bonnielee Haff, MD  levETIRAcetam (KEPPRA) 500 MG tablet Take 3 tablets (1,500 mg total) by mouth 2 (two) times daily. Patient not taking: Reported on 08/01/2015 05/29/15   Jule Ser, DO    Family History Family History  Problem Relation Age of Onset  . Asthma Mother     Social History Social History  Substance Use Topics  . Smoking status: Former Smoker    Types: Cigarettes    Quit date: 02/02/1999  . Smokeless tobacco: Former Systems developer  . Alcohol use No     Allergies   Review of patient's allergies indicates no known allergies.   Review of Systems Review of Systems  Unable to perform ROS: Patient nonverbal     Physical Exam Updated Vital Signs BP 114/86 (BP Location: Right Arm)   Pulse 88   Temp 97.6 F (36.4 C) (Oral)   Resp 16   SpO2 100%   Physical Exam  Constitutional: No distress.  Chronically ill appearing Nonverbal (baseline)  HENT:  Head: Atraumatic.  Eyes: Conjunctivae are normal. No scleral icterus.  Cardiovascular: Normal rate and regular rhythm.   Pulmonary/Chest: Effort normal. No respiratory distress.  Abdominal: Soft. He exhibits no distension.  g tube in place. I inflated balloon and tube is secure. No surrounding erythema, edema, or discharge.  Neurological: He is alert.  Skin: Skin is warm and dry. He is not diaphoretic.  Psychiatric: He has a normal mood and affect. His behavior is normal.  Nursing note and vitals reviewed.    ED Treatments / Results  Labs (all labs ordered are listed, but only abnormal results are displayed) Labs Reviewed - No data to display  EKG  EKG Interpretation None       Radiology Dg Abd Portable 1v  Result Date: 07/31/2015 CLINICAL DATA:  40 year old male with a percutaneous gastrostomy. Evaluate for positioning. EXAM: PORTABLE ABDOMEN - 1 VIEW COMPARISON:  Abdominal CT dated 03/03/2015 FINDINGS: A percutaneous gastrostomy is noted in the left hemiabdomen. Contrast injected through the tube opacifies the stomach. Air is noted throughout the colon. IMPRESSION: Percutaneous gastrostomy with tip in the stomach. Electronically Signed   By: Anner Crete M.D.   On: 07/31/2015  22:21    Procedures Procedures (including critical care time)  Medications Ordered in ED Medications - No data to display   Initial Impression / Assessment and Plan / ED Course  I have reviewed the triage vital signs and the nursing notes.  Pertinent labs & imaging results that were available during my care of the patient were reviewed by me and considered in my medical decision making (see chart for details).  G tube in place. I inflated tube. Discussed with family signs of infection. Encouraged close PCP f/u. ER return precautions given.  Final Clinical Impressions(s) / ED Diagnoses   Final diagnoses:  PEG tube malfunction [facility])    New Prescriptions New Prescriptions   No medications on file     Carmelina Dane 08/01/15 2038    Orlie Dakin, MD 08/02/15 1745

## 2015-08-01 NOTE — ED Notes (Signed)
[facility] contacted by RN for Pt transportation

## 2015-08-01 NOTE — ED Triage Notes (Signed)
Pt is from home where he has [place].  She states that G tube came out approximately 1130 today.  Aid replaced tube so that hole would not close but EMS questions cleanliness of tube based on situation at residence.  Paraplegic and MS Hx.

## 2015-08-01 NOTE — ED Notes (Signed)
Patient denies pain and is resting comfortably.  

## 2015-08-01 NOTE — Discharge Instructions (Signed)
Terry Palmer was seen in the emergency room for evaluation of his G-tube. We checked it, inflated the balloon, and it appears to be in proper position. Please follow up with his primary care provider this week. Monitor his g-tube site. Return to the ER for new or worsening symptoms such as redness spreading around the skin surrounding his tube, fever, vomiting, etc.

## 2015-08-02 ENCOUNTER — Telehealth: Payer: Self-pay | Admitting: Neurology

## 2015-08-02 NOTE — Telephone Encounter (Signed)
[name]/[facility] [phone] called to advise, patient's mother called to request private duty nursing, Malachy Mood needs to verify date patient was last seen in order to submit to [facility] for approval from [place].

## 2015-08-02 NOTE — Telephone Encounter (Signed)
Returned call to [place] to notify that pt's last appt w/ [facility] was 12/24/14. Malachy Mood may call back if further info is needed.

## 2015-11-02 ENCOUNTER — Encounter (HOSPITAL_COMMUNITY): Payer: Self-pay | Admitting: Emergency Medicine

## 2015-11-02 ENCOUNTER — Emergency Department (HOSPITAL_COMMUNITY)
Admission: EM | Admit: 2015-11-02 | Discharge: 2015-11-02 | Disposition: A | Discharge status: Home / Self Care | Payer: Medicare HMO | Attending: Emergency Medicine | Admitting: Emergency Medicine

## 2015-11-02 ENCOUNTER — Emergency Department (HOSPITAL_COMMUNITY): Payer: Medicare HMO

## 2015-11-02 DIAGNOSIS — Y69 Unspecified misadventure during surgical and medical care: Secondary | ICD-10-CM | POA: Diagnosis not present

## 2015-11-02 DIAGNOSIS — T85518A Breakdown (mechanical) of other gastrointestinal prosthetic devices, implants and grafts, initial encounter: Secondary | ICD-10-CM | POA: Insufficient documentation

## 2015-11-02 DIAGNOSIS — Z79899 Other long term (current) drug therapy: Secondary | ICD-10-CM | POA: Diagnosis not present

## 2015-11-02 DIAGNOSIS — K9420 Gastrostomy complication, unspecified: Secondary | ICD-10-CM

## 2015-11-02 MED ORDER — DIATRIZOATE MEGLUMINE & SODIUM 66-10 % PO SOLN
ORAL | Status: AC
  Administered 2015-11-02: 30 mL via ORAL
  Filled 2015-11-02: qty 30

## 2015-11-02 NOTE — ED Triage Notes (Signed)
Pt here from home with c/o a leaking peg tube , pt has ms . No evidence of leaking site has no redness or drainage at this time

## 2015-11-02 NOTE — ED Provider Notes (Signed)
[facility] DEPT Provider Note   CSN: [id number] Arrival date & time: 11/02/15  1555   LEVEL 5 CAVEAT - NON-VERBAL  History   Chief Complaint Chief Complaint  Patient presents with  . Wound Check    HPI Terry Palmer is a 40 y.o. male.  HPI  40 year old male brought in by EMS for an apparent G-tube complication. History taken from family at the bedside. Since 2 days ago, family has noticed leaking after liquid or medicine is placed into his G-tube. The color of the fluid is whatever they just put in. There has been no blood or pustular drainage. No fevers. No vomiting. This particular G-tube has been in for 6-7 weeks. Overall has had a G-tube since 2015  Past Medical History:  Diagnosis Date  . Aspiration pneumonia ([place]) 01/20/2013  . Bladder calculi   . Childhood asthma   . 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 Depression   . Dysphagia   . 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 MS (multiple sclerosis) ([place])   . Neurogenic bladder   . Neuromuscular disorder ([place])    Quadraperesis  . Normocytic anemia 05/28/2011  . Quadriparesis (muscle weakness) ([place]) 03/12/2011  . Quadriplegia and quadriparesis ([place]) 12/24/2014  . Recurrent upper respiratory infection (URI)   . Recurrent UTI   . Shortness of breath     Patient Active Problem List   Diagnosis Date Noted  . Encephalopathy   . Bacteremia 05/27/2015  . Sepsis ([place]) 05/25/2015  . Altered mental state 05/25/2015  . Altered mental status 05/25/2015  . Fever, unspecified 03/04/2015  . Quadriplegia and quadriparesis ([place]) 12/24/2014  . 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 Protein-calorie malnutrition, severe ([place]) 10/21/2014  . Sepsis secondary to UTI ([place]) 10/20/2014  . Leukocytosis 10/20/2014  . Acute respiratory failure with hypoxia ([place]) 10/20/2014  . Seizure disorder ([place]) 10/20/2014  . Aspiration pneumonitis ([place]) 10/20/2014  .  Neurogenic bladder 05/28/2011  . Multiple sclerosis ([place]) 04/20/2011  . FTT (failure to thrive) in adult 04/20/2011  . Sacral decubitus ulcer, stage II 04/20/2011  . Quadriparesis (muscle weakness) ([place]) 03/12/2011    Past Surgical History:  Procedure Laterality Date  . GASTROSTOMY  04/16/2011   Procedure: GASTROSTOMY;  Surgeon: Zenovia Jarred, MD;  Location: [facility];  Service: General;  Laterality: N/A;  Open G-Tube placement  . LUMBAR PUNCTURE  10/12/2002       Home Medications    Prior to Admission medications   Medication Sig Start Date End Date Taking? Authorizing Provider  acetaminophen (TYLENOL) 500 MG tablet Take 1 tablet (500 mg total) by mouth every 8 (eight) hours as needed for fever (or pain). Patient taking differently: Take 1,000 mg by mouth 2 (two) times daily.  03/05/15   Debbe Odea, MD  albuterol (PROVENTIL) (2.5 MG/3ML) 0.083% nebulizer solution Take 2.5 mg by nebulization every 6 (six) hours as needed for wheezing or shortness of breath.    Historical Provider, MD  baclofen (LIORESAL) 20 MG tablet Place 1 tablet (20 mg total) into feeding tube 4 (four) times daily. 07/13/15   Kathrynn Ducking, MD  bisacodyl (DULCOLAX) 10 MG suppository Place 10 mg rectally once as needed for moderate constipation.    Historical Provider, MD  buPROPion (WELLBUTRIN) 75 MG tablet Place 1 tablet (75 mg total) into feeding tube 3 (three) times daily. 01/24/13   Bonnielee Haff, MD  cholecalciferol (VITAMIN D) 1000 UNITS tablet Take 1 tablet (1,000 Units  total) by mouth 2 (two) times daily. Patient taking differently: Give 5,000 Units by tube daily.  01/24/13   Bonnielee Haff, MD  Cranberry Juice Powder 425 MG CAPS 1 capsule (425 mg total) by PEG Tube route 2 (two) times daily. Patient not taking: Reported on 08/01/2015 01/24/13   Bonnielee Haff, MD  CRANBERRY PO 425 mg by PEG Tube route 2 (two) times daily.    Historical Provider, MD  dantrolene (DANTRIUM) 50 MG capsule Give 200 mg by tube at  bedtime.     Historical Provider, MD  diazepam (VALIUM) 5 MG tablet Place 5 mg into feeding tube at bedtime as needed for anxiety (sleep).     Historical Provider, MD  fexofenadine (ALLEGRA) 180 MG tablet Place 180 mg into feeding tube daily.    Historical Provider, MD  Garlic Oil 123XX123 MG CAPS Give 1,000 mg by tube daily with breakfast.     Historical Provider, MD  HYDROcodone-acetaminophen (NORCO) 7.5-325 MG per tablet Place 1 tablet into feeding tube daily as needed (pain).     Historical Provider, MD  ipratropium-albuterol (DUONEB) 0.5-2.5 (3) MG/3ML SOLN Take 3 mLs by nebulization 2 (two) times daily.    Historical Provider, MD  Lactobacillus (ACIDOPHILUS PO) Place 1 capsule into feeding tube daily.     Historical Provider, MD  levETIRAcetam (KEPPRA) 100 MG/ML solution Take 1,000 mg by mouth 2 (two) times daily.    Historical Provider, MD  levETIRAcetam (KEPPRA) 500 MG tablet Take 3 tablets (1,500 mg total) by mouth 2 (two) times daily. Patient not taking: Reported on 08/01/2015 05/29/15   Jule Ser, DO  liver oil-zinc oxide (DESITIN) 40 % ointment Apply 1 application topically 4 (four) times daily as needed for irritation.    Historical Provider, MD  magnesium hydroxide (MILK OF MAGNESIA) 400 MG/5ML suspension Take 30 mLs by mouth daily as needed for mild constipation.    Historical Provider, MD  Multiple Vitamin (MULTIVITAMIN WITH MINERALS) TABS tablet Place 1 tablet into feeding tube daily. 01/24/13   Bonnielee Haff, MD  NALTREXONE HCL PO Give 4.5 mg by tube at bedtime.    Historical Provider, MD  neomycin-bacitracin-polymyxin (NEOSPORIN) [phone] ointment Apply 1 application topically 4 (four) times daily as needed (abraisions, irritations).    Historical Provider, MD  Nutritional Supplements (FEEDING SUPPLEMENT, OSMOLITE 1.5 CAL,) LIQD Place 80 mL/hr into feeding tube See admin instructions. For 14 hours a day Patient taking differently: Place 75 mL/hr into feeding tube See admin  instructions. For 14 hours a day 05/29/15   Jule Ser, DO  nystatin (MYCOSTATIN/NYSTOP) 100000 UNIT/GM POWD Apply 1 g topically 3 (three) times daily. Patient taking differently: Apply 1 g topically daily. Applies to groin 01/24/13   Bonnielee Haff, MD  omeprazole (PRILOSEC) 2 mg/mL SUSP Place 20 mLs (40 mg total) into feeding tube daily. 01/24/13   Bonnielee Haff, MD  polyethylene glycol (MIRALAX / GLYCOLAX) packet Place 17 g into feeding tube daily. Patient taking differently: Place 17 g into feeding tube every evening.  01/24/13   Bonnielee Haff, MD  polyvinyl alcohol (LIQUIFILM TEARS) 1.4 % ophthalmic solution Place 1 drop into both eyes 3 (three) times daily. Patient taking differently: Place 1 drop into both eyes 2 (two) times daily.  01/24/13   Bonnielee Haff, MD  predniSONE 5 MG/ML concentrated solution Place 2 mLs (10 mg total) into feeding tube daily with breakfast. Patient taking differently: Place 25 mg into feeding tube daily with breakfast.  11/02/14   Robbie Lis, MD  Saline (  SIMPLY SALINE) 0.9 % AERS Place 1 spray into both nostrils as needed (for nasal conjestion).    Historical Provider, MD  senna (SENOKOT) 8.6 MG TABS tablet Take 2 tablets by mouth daily.    Historical Provider, MD  sodium phosphate (FLEET) enema Place 1 enema rectally once as needed (for constipation). follow package directions    Historical Provider, MD  tiZANidine (ZANAFLEX) 2 MG tablet Take 1 tablet (2 mg total) by mouth every 6 (six) hours as needed for muscle spasms. 07/28/15   Kathrynn Ducking, MD  vitamin C (ASCORBIC ACID) 500 MG tablet Place 500 mg into feeding tube 2 (two) times daily.    Historical Provider, MD  Water For Irrigation, Sterile (FREE WATER) SOLN Place 240 mLs into feeding tube every 4 (four) hours. 01/24/13   Bonnielee Haff, MD    Family History Family History  Problem Relation Age of Onset  . Asthma Mother     Social History Social History  Substance Use Topics  . Smoking  status: Former Smoker    Types: Cigarettes    Quit date: 02/02/1999  . Smokeless tobacco: Former Systems developer  . Alcohol use No     Allergies   Review of patient's allergies indicates no known allergies.   Review of Systems Review of Systems  Unable to perform ROS: Patient nonverbal     Physical Exam Updated Vital Signs BP 105/75   Pulse 103   Temp 97.7 F (36.5 C) (Axillary)   Resp 18   SpO2 95%   Physical Exam  Constitutional: He appears well-developed and well-nourished.  HENT:  Head: Normocephalic and atraumatic.  Right Ear: External ear normal.  Left Ear: External ear normal.  Nose: Nose normal.  Eyes: Right eye exhibits no discharge. Left eye exhibits no discharge.  Neck: Neck supple.  Cardiovascular: Normal rate, regular rhythm and normal heart sounds.   Pulmonary/Chest: Effort normal and breath sounds normal.  Abdominal: Soft. He exhibits no distension. There is no tenderness.  18 Fr G-tube in place in LUQ. No redness, or obvious drainage/pus. No apparent tenderness  Musculoskeletal: He exhibits no edema.  Neurological: He is alert.  Skin: Skin is warm and dry.  Nursing note and vitals reviewed.    ED Treatments / Results  Labs (all labs ordered are listed, but only abnormal results are displayed) Labs Reviewed - No data to display  EKG  EKG Interpretation None       Radiology Dg Abdomen 1 View  Result Date: 11/02/2015 CLINICAL DATA:  Gastric tube placement EXAM: ABDOMEN - 1 VIEW COMPARISON:  07/31/2015 FINDINGS: Enteric contrast is seen within the lumen of the stomach passing antegrade into duodenum. This would be in keeping with an intraluminal position of the gastric tube tip. No extravasation noted. Chronic mild distention of small and large bowel loops. IMPRESSION: Gastric tube tip is seen in the stomach. Position confirmed with oral contrast within the stomach lumen and passing antegrade into the proximal duodenum. Electronically Signed   By: Ashley Royalty M.D.   On: 11/02/2015 19:53    Procedures Gastrostomy tube replacement Date/Time: 11/02/2015 8:26 PM Performed by: Sherwood Gambler Authorized by: Sherwood Gambler  Consent: Verbal consent obtained. Consent given by: spouse Patient identity confirmed: arm band and hospital-assigned identification number Time out: Immediately prior to procedure a "time out" was called to verify the correct patient, procedure, equipment, support staff and site/side marked as required. Preparation: Patient was prepped and draped in the usual sterile fashion. Local anesthesia used:  no  Anesthesia: Local anesthesia used: no  Sedation: Patient sedated: no Patient tolerance: Patient tolerated the procedure well with no immediate complications    (including critical care time)  Medications Ordered in ED Medications  diatrizoate meglumine-sodium (GASTROGRAFIN) 66-10 % solution (30 mLs Oral Given 11/02/15 1925)     Initial Impression / Assessment and Plan / ED Course  I have reviewed the triage vital signs and the nursing notes.  Pertinent labs & imaging results that were available during my care of the patient were reviewed by me and considered in my medical decision making (see chart for details).  Clinical Course  Comment By Time  Not currently leaking and does not appear infected or otherwise wrong. The tube is snuggly in place, so I don't think an upsize would be needed. Will get an 18 fr G-tube and replace Sherwood Gambler, MD 10/25 1608  We have found a G-tube, I replaced it without difficulty. Ainsley Spinner, MD 10/25 1814    Xray shows correct placement. No obvious complications at this time. F/u with PCP for continued care of G-tube. No other obvious acute issues.   Final Clinical Impressions(s) / ED Diagnoses   Final diagnoses:  Complication of gastrostomy tube [facility])    New Prescriptions New Prescriptions   No medications on file     Sherwood Gambler, MD 11/02/15 2027

## 2015-11-02 NOTE — ED Notes (Signed)
Placed patient on the monitor did vitals

## 2015-11-02 NOTE — ED Notes (Signed)
Patient left at this time with all belongings via ambulance.

## 2015-11-25 ENCOUNTER — Telehealth: Payer: Self-pay

## 2015-11-25 MED ORDER — TIZANIDINE HCL 2 MG PO TABS: 2.0000 mg | ORAL_TABLET | Freq: Four times a day (QID) | ORAL | 0 refills | Status: DC | PRN

## 2015-11-25 NOTE — Telephone Encounter (Signed)
Refill e-scribed per faxed request from pharmacy.

## 2016-01-26 ENCOUNTER — Encounter (HOSPITAL_COMMUNITY): Payer: Self-pay | Admitting: Emergency Medicine

## 2016-01-26 ENCOUNTER — Emergency Department (HOSPITAL_COMMUNITY)
Admission: EM | Admit: 2016-01-26 | Discharge: 2016-01-26 | Disposition: A | Discharge status: Home / Self Care | Payer: Medicare HMO | Attending: Emergency Medicine | Admitting: Emergency Medicine

## 2016-01-26 DIAGNOSIS — Z466 Encounter for fitting and adjustment of urinary device: Secondary | ICD-10-CM | POA: Diagnosis not present

## 2016-01-26 DIAGNOSIS — Z79899 Other long term (current) drug therapy: Secondary | ICD-10-CM | POA: Insufficient documentation

## 2016-01-26 DIAGNOSIS — R399 Unspecified symptoms and signs involving the genitourinary system: Secondary | ICD-10-CM

## 2016-01-26 DIAGNOSIS — Z87891 Personal history of nicotine dependence: Secondary | ICD-10-CM | POA: Diagnosis not present

## 2016-01-26 DIAGNOSIS — Z978 Presence of other specified devices: Secondary | ICD-10-CM

## 2016-01-26 LAB — I-STAT CHEM 8, ED
BUN: 14 mg/dL (ref 6–20)
CALCIUM ION: 1.11 mmol/L — AB (ref 1.15–1.40)
Chloride: 103 mmol/L (ref 101–111)
Creatinine, Ser: 0.7 mg/dL (ref 0.61–1.24)
Glucose, Bld: 130 mg/dL — ABNORMAL HIGH (ref 65–99)
HEMATOCRIT: 45 % (ref 39.0–52.0)
HEMOGLOBIN: 15.3 g/dL (ref 13.0–17.0)
Potassium: 4.4 mmol/L (ref 3.5–5.1)
SODIUM: 138 mmol/L (ref 135–145)
TCO2: 29 mmol/L (ref 0–100)

## 2016-01-26 NOTE — ED Provider Notes (Signed)
[facility] DEPT Provider Note   CSN: [id number] Arrival date & time: 01/26/16  1456     History   Chief Complaint Chief Complaint  Patient presents with  . Male GU Problem    HPI Terry Palmer is a 41 y.o. male. He was sent in for evaluation by his visiting nurse stating that she felt as catheter was ordered because he had decreased urine output. Upon arrival he has yellow urine in his bag. He does not have apparent bladder obstruction or catheter dysfunction. He is nonverbal at baseline.  Patient has severe debilitating multiple sclerosis and is bedridden and nonverbal at baseline  HPI  Past Medical History:  Diagnosis Date  . Aspiration pneumonia ([place]) 01/20/2013  . Bladder calculi   . Childhood asthma   . 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 Depression   . Dysphagia   . 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 MS (multiple sclerosis) ([place])   . Neurogenic bladder   . Neuromuscular disorder ([place])    Quadraperesis  . Normocytic anemia 05/28/2011  . Quadriparesis (muscle weakness) ([place]) 03/12/2011  . Quadriplegia and quadriparesis ([place]) 12/24/2014  . Recurrent upper respiratory infection (URI)   . Recurrent UTI   . Shortness of breath     Patient Active Problem List   Diagnosis Date Noted  . Encephalopathy   . Bacteremia 05/27/2015  . Sepsis ([place]) 05/25/2015  . Altered mental state 05/25/2015  . Altered mental status 05/25/2015  . Fever, unspecified 03/04/2015  . Quadriplegia and quadriparesis ([place]) 12/24/2014  . 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 Protein-calorie malnutrition, severe ([place]) 10/21/2014  . Sepsis secondary to UTI ([place]) 10/20/2014  . Leukocytosis 10/20/2014  . Acute respiratory failure with hypoxia ([place]) 10/20/2014  . Seizure disorder ([place]) 10/20/2014  . Aspiration pneumonitis ([place]) 10/20/2014  . Neurogenic bladder 05/28/2011  . Multiple sclerosis ([place]) 04/20/2011  .  FTT (failure to thrive) in adult 04/20/2011  . Sacral decubitus ulcer, stage II 04/20/2011  . Quadriparesis (muscle weakness) ([place]) 03/12/2011    Past Surgical History:  Procedure Laterality Date  . GASTROSTOMY  04/16/2011   Procedure: GASTROSTOMY;  Surgeon: Zenovia Jarred, MD;  Location: [facility];  Service: General;  Laterality: N/A;  Open G-Tube placement  . LUMBAR PUNCTURE  10/12/2002       Home Medications    Prior to Admission medications   Medication Sig Start Date End Date Taking? Authorizing Provider  acetaminophen (TYLENOL) 500 MG tablet Take 1 tablet (500 mg total) by mouth every 8 (eight) hours as needed for fever (or pain). Patient taking differently: Take 1,000 mg by mouth 2 (two) times daily.  03/05/15   Debbe Odea, MD  albuterol (PROVENTIL) (2.5 MG/3ML) 0.083% nebulizer solution Take 2.5 mg by nebulization every 6 (six) hours as needed for wheezing or shortness of breath.    Historical Provider, MD  baclofen (LIORESAL) 20 MG tablet Place 1 tablet (20 mg total) into feeding tube 4 (four) times daily. 07/13/15   Kathrynn Ducking, MD  bisacodyl (DULCOLAX) 10 MG suppository Place 10 mg rectally once as needed for moderate constipation.    Historical Provider, MD  buPROPion (WELLBUTRIN) 75 MG tablet Place 1 tablet (75 mg total) into feeding tube 3 (three) times daily. 01/24/13   Bonnielee Haff, MD  cholecalciferol (VITAMIN D) 1000 UNITS tablet Take 1 tablet (1,000 Units total) by mouth 2 (two) times daily. Patient taking differently: Give 5,000 Units by tube daily.  01/24/13   Bonnielee Haff, MD  Cranberry Juice Powder 425 MG CAPS 1 capsule (425 mg total) by PEG Tube route 2 (two) times daily. Patient not taking: Reported on 08/01/2015 01/24/13   Bonnielee Haff, MD  CRANBERRY PO 425 mg by PEG Tube route 2 (two) times daily.    Historical Provider, MD  dantrolene (DANTRIUM) 50 MG capsule Give 200 mg by tube at bedtime.     Historical Provider, MD  diazepam (VALIUM) 5 MG tablet Place 5  mg into feeding tube at bedtime as needed for anxiety (sleep).     Historical Provider, MD  fexofenadine (ALLEGRA) 180 MG tablet Place 180 mg into feeding tube daily.    Historical Provider, MD  Garlic Oil [id number] MG CAPS Give 1,000 mg by tube daily with breakfast.     Historical Provider, MD  HYDROcodone-acetaminophen (NORCO) 7.5-325 MG per tablet Place 1 tablet into feeding tube daily as needed (pain).     Historical Provider, MD  ipratropium-albuterol (DUONEB) 0.5-2.5 (3) MG/3ML SOLN Take 3 mLs by nebulization 2 (two) times daily.    Historical Provider, MD  Lactobacillus (ACIDOPHILUS PO) Place 1 capsule into feeding tube daily.     Historical Provider, MD  levETIRAcetam (KEPPRA) 100 MG/ML solution Take 1,000 mg by mouth 2 (two) times daily.    Historical Provider, MD  levETIRAcetam (KEPPRA) 500 MG tablet Take 3 tablets (1,500 mg total) by mouth 2 (two) times daily. Patient not taking: Reported on 08/01/2015 05/29/15   Jule Ser, DO  liver oil-zinc oxide (DESITIN) 40 % ointment Apply 1 application topically 4 (four) times daily as needed for irritation.    Historical Provider, MD  magnesium hydroxide (MILK OF MAGNESIA) 400 MG/5ML suspension Take 30 mLs by mouth daily as needed for mild constipation.    Historical Provider, MD  Multiple Vitamin (MULTIVITAMIN WITH MINERALS) TABS tablet Place 1 tablet into feeding tube daily. 01/24/13   Bonnielee Haff, MD  NALTREXONE HCL PO Give 4.5 mg by tube at bedtime.    Historical Provider, MD  neomycin-bacitracin-polymyxin (NEOSPORIN) [phone] ointment Apply 1 application topically 4 (four) times daily as needed (abraisions, irritations).    Historical Provider, MD  Nutritional Supplements (FEEDING SUPPLEMENT, OSMOLITE 1.5 CAL,) LIQD Place 80 mL/hr into feeding tube See admin instructions. For 14 hours a day Patient taking differently: Place 75 mL/hr into feeding tube See admin instructions. For 14 hours a day 05/29/15   Jule Ser, DO  nystatin  (MYCOSTATIN/NYSTOP) 100000 UNIT/GM POWD Apply 1 g topically 3 (three) times daily. Patient taking differently: Apply 1 g topically daily. Applies to groin 01/24/13   Bonnielee Haff, MD  omeprazole (PRILOSEC) 2 mg/mL SUSP Place 20 mLs (40 mg total) into feeding tube daily. 01/24/13   Bonnielee Haff, MD  polyethylene glycol (MIRALAX / GLYCOLAX) packet Place 17 g into feeding tube daily. Patient taking differently: Place 17 g into feeding tube every evening.  01/24/13   Bonnielee Haff, MD  polyvinyl alcohol (LIQUIFILM TEARS) 1.4 % ophthalmic solution Place 1 drop into both eyes 3 (three) times daily. Patient taking differently: Place 1 drop into both eyes 2 (two) times daily.  01/24/13   Bonnielee Haff, MD  predniSONE 5 MG/ML concentrated solution Place 2 mLs (10 mg total) into feeding tube daily with breakfast. Patient taking differently: Place 25 mg into feeding tube daily with breakfast.  11/02/14   Robbie Lis, MD  Saline (SIMPLY SALINE) 0.9 % AERS Place 1 spray into both nostrils as needed (for nasal conjestion).  Historical Provider, MD  senna (SENOKOT) 8.6 MG TABS tablet Take 2 tablets by mouth daily.    Historical Provider, MD  sodium phosphate (FLEET) enema Place 1 enema rectally once as needed (for constipation). follow package directions    Historical Provider, MD  tiZANidine (ZANAFLEX) 2 MG tablet Take 1 tablet (2 mg total) by mouth every 6 (six) hours as needed for muscle spasms. 11/25/15   Kathrynn Ducking, MD  vitamin C (ASCORBIC ACID) 500 MG tablet Place 500 mg into feeding tube 2 (two) times daily.    Historical Provider, MD  Water For Irrigation, Sterile (FREE WATER) SOLN Place 240 mLs into feeding tube every 4 (four) hours. 01/24/13   Bonnielee Haff, MD    Family History Family History  Problem Relation Age of Onset  . Asthma Mother     Social History Social History  Substance Use Topics  . Smoking status: Former Smoker    Types: Cigarettes    Quit date: 02/02/1999  .  Smokeless tobacco: Former Systems developer  . Alcohol use No     Allergies   Patient has no known allergies.   Review of Systems Review of Systems  Unable to perform ROS: Patient nonverbal     Physical Exam Updated Vital Signs BP 119/86 (BP Location: Right Arm)   Pulse 84   Temp 97.9 F (36.6 C) (Oral)   Resp 14   SpO2 99%   Physical Exam  Constitutional:  Multiple contractures of all extremities.  HENT:  Mucous membranes moist. Pupils reactive. Conjunctiva not pale.  Eyes:  Conjunctiva not pale  Cardiovascular: Normal rate.   Pulmonary/Chest: Effort normal.  Abdominal: Soft.  Genitourinary: Penis normal.  Musculoskeletal:  Flexion contractures all extremities.  Skin: Skin is warm.     ED Treatments / Results  Labs (all labs ordered are listed, but only abnormal results are displayed) Labs Reviewed  I-STAT CHEM 8, ED - Abnormal; Notable for the following:       Result Value   Glucose, Bld 130 (*)    Calcium, Ion 1.11 (*)    All other components within normal limits    EKG  EKG Interpretation None       Radiology No results found.  Procedures Procedures (including critical care time)  Medications Ordered in ED Medications - No data to display   Initial Impression / Assessment and Plan / ED Course  I have reviewed the triage vital signs and the nursing notes.  Pertinent labs & imaging results that were available during my care of the patient were reviewed by me and considered in my medical decision making (see chart for details).     All creatinine. Normal yellow urine in his bag. Is on for clinically dehydrated. Appropriate for discharge back to his facility  Final Clinical Impressions(s) / ED Diagnoses   Final diagnoses:  Complaint about urinary catheter    New Prescriptions New Prescriptions   No medications on file     Tanna Furry, MD 01/26/16 1559

## 2016-01-26 NOTE — ED Triage Notes (Signed)
Patient received from [facility]. Per home RN patient has had decreased urine output; concerned about possible blocked catheter. Urine in catheter clear, yellow. ED Bladder scan = 56cc urine.

## 2016-01-26 NOTE — Discharge Instructions (Signed)
Catheter is functioning normally. No attention required. Patient's kidney function is normal.

## 2016-02-08 ENCOUNTER — Inpatient Hospital Stay (HOSPITAL_COMMUNITY): Payer: Medicare HMO

## 2016-02-08 ENCOUNTER — Inpatient Hospital Stay (HOSPITAL_COMMUNITY)
Admission: EM | Admit: 2016-02-08 | Discharge: 2016-02-13 | DRG: 871 | Disposition: A | Discharge status: Home / Self Care | Payer: Medicare HMO | Attending: Family Medicine | Admitting: Family Medicine

## 2016-02-08 ENCOUNTER — Emergency Department (HOSPITAL_COMMUNITY): Payer: Medicare HMO

## 2016-02-08 ENCOUNTER — Encounter (HOSPITAL_COMMUNITY): Payer: Self-pay

## 2016-02-08 DIAGNOSIS — N319 Neuromuscular dysfunction of bladder, unspecified: Secondary | ICD-10-CM | POA: Diagnosis present

## 2016-02-08 DIAGNOSIS — M24522 Contracture, left elbow: Secondary | ICD-10-CM | POA: Diagnosis present

## 2016-02-08 DIAGNOSIS — R400 Somnolence: Secondary | ICD-10-CM | POA: Diagnosis not present

## 2016-02-08 DIAGNOSIS — M24552 Contracture, left hip: Secondary | ICD-10-CM | POA: Diagnosis present

## 2016-02-08 DIAGNOSIS — Z7952 Long term (current) use of systemic steroids: Secondary | ICD-10-CM

## 2016-02-08 DIAGNOSIS — G35 Multiple sclerosis: Secondary | ICD-10-CM | POA: Diagnosis present

## 2016-02-08 DIAGNOSIS — M24551 Contracture, right hip: Secondary | ICD-10-CM | POA: Diagnosis present

## 2016-02-08 DIAGNOSIS — B999 Unspecified infectious disease: Secondary | ICD-10-CM | POA: Diagnosis not present

## 2016-02-08 DIAGNOSIS — M24561 Contracture, right knee: Secondary | ICD-10-CM | POA: Diagnosis present

## 2016-02-08 DIAGNOSIS — Z79899 Other long term (current) drug therapy: Secondary | ICD-10-CM

## 2016-02-08 DIAGNOSIS — M6250 Muscle wasting and atrophy, not elsewhere classified, unspecified site: Secondary | ICD-10-CM | POA: Diagnosis present

## 2016-02-08 DIAGNOSIS — N3001 Acute cystitis with hematuria: Secondary | ICD-10-CM

## 2016-02-08 DIAGNOSIS — A419 Sepsis, unspecified organism: Secondary | ICD-10-CM | POA: Diagnosis present

## 2016-02-08 DIAGNOSIS — F329 Major depressive disorder, single episode, unspecified: Secondary | ICD-10-CM | POA: Diagnosis present

## 2016-02-08 DIAGNOSIS — M2450 Contracture, unspecified joint: Secondary | ICD-10-CM

## 2016-02-08 DIAGNOSIS — R1310 Dysphagia, unspecified: Secondary | ICD-10-CM | POA: Diagnosis present

## 2016-02-08 DIAGNOSIS — M24562 Contracture, left knee: Secondary | ICD-10-CM | POA: Diagnosis present

## 2016-02-08 DIAGNOSIS — G40909 Epilepsy, unspecified, not intractable, without status epilepticus: Secondary | ICD-10-CM | POA: Diagnosis present

## 2016-02-08 DIAGNOSIS — E860 Dehydration: Secondary | ICD-10-CM | POA: Diagnosis not present

## 2016-02-08 DIAGNOSIS — G8250 Quadriplegia, unspecified: Secondary | ICD-10-CM | POA: Diagnosis present

## 2016-02-08 DIAGNOSIS — Z79891 Long term (current) use of opiate analgesic: Secondary | ICD-10-CM

## 2016-02-08 DIAGNOSIS — M24521 Contracture, right elbow: Secondary | ICD-10-CM | POA: Diagnosis present

## 2016-02-08 DIAGNOSIS — J45909 Unspecified asthma, uncomplicated: Secondary | ICD-10-CM | POA: Diagnosis present

## 2016-02-08 DIAGNOSIS — Z931 Gastrostomy status: Secondary | ICD-10-CM | POA: Diagnosis not present

## 2016-02-08 DIAGNOSIS — F0280 Dementia in other diseases classified elsewhere without behavioral disturbance: Secondary | ICD-10-CM | POA: Diagnosis present

## 2016-02-08 DIAGNOSIS — Z8614 Personal history of Methicillin resistant Staphylococcus aureus infection: Secondary | ICD-10-CM

## 2016-02-08 DIAGNOSIS — I4891 Unspecified atrial fibrillation: Secondary | ICD-10-CM | POA: Diagnosis present

## 2016-02-08 DIAGNOSIS — Z22322 Carrier or suspected carrier of Methicillin resistant Staphylococcus aureus: Secondary | ICD-10-CM

## 2016-02-08 DIAGNOSIS — N179 Acute kidney failure, unspecified: Secondary | ICD-10-CM | POA: Diagnosis not present

## 2016-02-08 DIAGNOSIS — Z9359 Other cystostomy status: Secondary | ICD-10-CM | POA: Diagnosis not present

## 2016-02-08 DIAGNOSIS — Z825 Family history of asthma and other chronic lower respiratory diseases: Secondary | ICD-10-CM

## 2016-02-08 DIAGNOSIS — Z87440 Personal history of urinary (tract) infections: Secondary | ICD-10-CM

## 2016-02-08 DIAGNOSIS — E876 Hypokalemia: Secondary | ICD-10-CM | POA: Diagnosis present

## 2016-02-08 DIAGNOSIS — R9401 Abnormal electroencephalogram [EEG]: Secondary | ICD-10-CM | POA: Diagnosis present

## 2016-02-08 DIAGNOSIS — R4182 Altered mental status, unspecified: Secondary | ICD-10-CM | POA: Diagnosis not present

## 2016-02-08 DIAGNOSIS — Z87891 Personal history of nicotine dependence: Secondary | ICD-10-CM

## 2016-02-08 LAB — CBC WITH DIFFERENTIAL/PLATELET
BASOS PCT: 0 %
Basophils Absolute: 0 10*3/uL (ref 0.0–0.1)
EOS ABS: 0 10*3/uL (ref 0.0–0.7)
EOS PCT: 0 %
HCT: 42.6 % (ref 39.0–52.0)
Hemoglobin: 14.2 g/dL (ref 13.0–17.0)
LYMPHS ABS: 2.4 10*3/uL (ref 0.7–4.0)
Lymphocytes Relative: 7 %
MCH: 31.1 pg (ref 26.0–34.0)
MCHC: 33.3 g/dL (ref 30.0–36.0)
MCV: 93.4 fL (ref 78.0–100.0)
Monocytes Absolute: 2.4 10*3/uL — ABNORMAL HIGH (ref 0.1–1.0)
Monocytes Relative: 7 %
NEUTROS PCT: 86 %
Neutro Abs: 28.8 10*3/uL — ABNORMAL HIGH (ref 1.7–7.7)
PLATELETS: 228 10*3/uL (ref 150–400)
RBC: 4.56 MIL/uL (ref 4.22–5.81)
RDW: 13.8 % (ref 11.5–15.5)
WBC: 33.6 10*3/uL — AB (ref 4.0–10.5)

## 2016-02-08 LAB — COMPREHENSIVE METABOLIC PANEL
ALK PHOS: 87 U/L (ref 38–126)
ALT: 23 U/L (ref 17–63)
AST: 22 U/L (ref 15–41)
Albumin: 4.1 g/dL (ref 3.5–5.0)
Anion gap: 12 (ref 5–15)
BUN: 12 mg/dL (ref 6–20)
CALCIUM: 9.6 mg/dL (ref 8.9–10.3)
CO2: 25 mmol/L (ref 22–32)
CREATININE: 0.62 mg/dL (ref 0.61–1.24)
Chloride: 96 mmol/L — ABNORMAL LOW (ref 101–111)
Glucose, Bld: 86 mg/dL (ref 65–99)
Potassium: 3.7 mmol/L (ref 3.5–5.1)
Sodium: 133 mmol/L — ABNORMAL LOW (ref 135–145)
Total Bilirubin: 0.6 mg/dL (ref 0.3–1.2)
Total Protein: 7.8 g/dL (ref 6.5–8.1)

## 2016-02-08 LAB — URINALYSIS, ROUTINE W REFLEX MICROSCOPIC
BILIRUBIN URINE: NEGATIVE
GLUCOSE, UA: NEGATIVE mg/dL
KETONES UR: 15 mg/dL — AB
Nitrite: POSITIVE — AB
PROTEIN: NEGATIVE mg/dL
Specific Gravity, Urine: 1.02 (ref 1.005–1.030)
pH: 7 (ref 5.0–8.0)

## 2016-02-08 LAB — URINALYSIS, MICROSCOPIC (REFLEX)

## 2016-02-08 LAB — LACTIC ACID, PLASMA: Lactic Acid, Venous: 3.3 mmol/L (ref 0.5–1.9)

## 2016-02-08 LAB — I-STAT CG4 LACTIC ACID, ED: Lactic Acid, Venous: 2.04 mmol/L (ref 0.5–1.9)

## 2016-02-08 LAB — MAGNESIUM: Magnesium: 2.2 mg/dL (ref 1.7–2.4)

## 2016-02-08 LAB — INFLUENZA PANEL BY PCR (TYPE A & B)
INFLBPCR: NEGATIVE
Influenza A By PCR: NEGATIVE

## 2016-02-08 LAB — PHOSPHORUS: PHOSPHORUS: 2.6 mg/dL (ref 2.5–4.6)

## 2016-02-08 MED ORDER — RISAQUAD PO CAPS: 1.0000 | ORAL_CAPSULE | Freq: Every day | ORAL | Status: DC

## 2016-02-08 MED ORDER — PIPERACILLIN-TAZOBACTAM 3.375 G IVPB
3.3750 g | Freq: Three times a day (TID) | INTRAVENOUS | Status: AC
  Administered 2016-02-09 – 2016-02-10 (×5): 3.375 g via INTRAVENOUS
  Filled 2016-02-08 (×6): qty 50

## 2016-02-08 MED ORDER — ACIDOPHILUS PO TABS: 1.0000 | ORAL_TABLET | Freq: Every day | ORAL | Status: DC

## 2016-02-08 MED ORDER — BISACODYL 10 MG RE SUPP: 10.0000 mg | Freq: Once | RECTAL | Status: DC | PRN

## 2016-02-08 MED ORDER — DIAZEPAM 5 MG PO TABS: 5.0000 mg | ORAL_TABLET | Freq: Every evening | ORAL | Status: DC | PRN

## 2016-02-08 MED ORDER — SODIUM CHLORIDE 0.9 % IV BOLUS (SEPSIS)
1000.0000 mL | Freq: Once | INTRAVENOUS | Status: AC
  Administered 2016-02-08: 1000 mL via INTRAVENOUS

## 2016-02-08 MED ORDER — PIPERACILLIN-TAZOBACTAM 3.375 G IVPB 30 MIN
3.3750 g | Freq: Once | INTRAVENOUS | Status: AC
  Administered 2016-02-08: 3.375 g via INTRAVENOUS
  Filled 2016-02-08: qty 50

## 2016-02-08 MED ORDER — PIPERACILLIN-TAZOBACTAM 3.375 G IVPB 30 MIN
3.3750 g | Freq: Once | INTRAVENOUS | Status: AC
  Administered 2016-02-09: 3.375 g via INTRAVENOUS
  Filled 2016-02-08: qty 50

## 2016-02-08 MED ORDER — LORATADINE 10 MG PO TABS
10.0000 mg | ORAL_TABLET | Freq: Every day | ORAL | Status: DC
  Administered 2016-02-09 – 2016-02-13 (×5): 10 mg
  Filled 2016-02-08 (×5): qty 1

## 2016-02-08 MED ORDER — BACLOFEN 20 MG PO TABS
20.0000 mg | ORAL_TABLET | Freq: Four times a day (QID) | ORAL | Status: DC
  Administered 2016-02-08 – 2016-02-13 (×18): 20 mg
  Filled 2016-02-08: qty 2
  Filled 2016-02-08: qty 1
  Filled 2016-02-08: qty 2
  Filled 2016-02-08 (×2): qty 1
  Filled 2016-02-08: qty 2
  Filled 2016-02-08 (×5): qty 1
  Filled 2016-02-08: qty 2
  Filled 2016-02-08: qty 1
  Filled 2016-02-08: qty 2
  Filled 2016-02-08 (×4): qty 1
  Filled 2016-02-08 (×2): qty 2
  Filled 2016-02-08: qty 1
  Filled 2016-02-08 (×2): qty 2
  Filled 2016-02-08: qty 1
  Filled 2016-02-08: qty 2
  Filled 2016-02-08 (×5): qty 1
  Filled 2016-02-08: qty 2

## 2016-02-08 MED ORDER — IPRATROPIUM-ALBUTEROL 0.5-2.5 (3) MG/3ML IN SOLN
3.0000 mL | Freq: Two times a day (BID) | RESPIRATORY_TRACT | Status: DC
  Administered 2016-02-09: 3 mL via RESPIRATORY_TRACT
  Filled 2016-02-08 (×2): qty 3

## 2016-02-08 MED ORDER — DEXTROSE-NACL 5-0.45 % IV SOLN
INTRAVENOUS | Status: DC
  Administered 2016-02-08 – 2016-02-10 (×5): via INTRAVENOUS

## 2016-02-08 MED ORDER — DANTROLENE SODIUM 100 MG PO CAPS
200.0000 mg | ORAL_CAPSULE | Freq: Every day | ORAL | Status: DC
  Administered 2016-02-08 – 2016-02-12 (×5): 200 mg
  Filled 2016-02-08 (×5): qty 2

## 2016-02-08 MED ORDER — LEVETIRACETAM 100 MG/ML PO SOLN
1500.0000 mg | Freq: Two times a day (BID) | ORAL | Status: DC
Start: 2016-02-08 — End: 2016-02-09
  Administered 2016-02-08: 1500 mg via ORAL
  Filled 2016-02-08 (×2): qty 15

## 2016-02-08 MED ORDER — SENNA 8.6 MG PO TABS
2.0000 | ORAL_TABLET | Freq: Every day | ORAL | Status: DC
  Administered 2016-02-08: 17.2 mg via ORAL
  Filled 2016-02-08: qty 2

## 2016-02-08 MED ORDER — FREE WATER
240.0000 mL | Status: DC
  Administered 2016-02-08: 240 mL

## 2016-02-08 MED ORDER — PREDNISONE 5 MG/ML PO CONC
5.0000 mg | Freq: Every day | ORAL | Status: DC
  Administered 2016-02-09 – 2016-02-13 (×5): 5 mg
  Filled 2016-02-08 (×5): qty 1

## 2016-02-08 MED ORDER — VANCOMYCIN HCL IN DEXTROSE 1-5 GM/200ML-% IV SOLN
1000.0000 mg | Freq: Three times a day (TID) | INTRAVENOUS | Status: DC
  Administered 2016-02-09 (×3): 1000 mg via INTRAVENOUS
  Filled 2016-02-08 (×4): qty 200

## 2016-02-08 MED ORDER — TRIPLE ANTIBIOTIC 5-400-5000 EX OINT: 1.0000 "application " | TOPICAL_OINTMENT | Freq: Four times a day (QID) | CUTANEOUS | Status: DC | PRN

## 2016-02-08 MED ORDER — ADULT MULTIVITAMIN W/MINERALS CH
1.0000 | ORAL_TABLET | Freq: Every day | ORAL | Status: DC
  Administered 2016-02-09 – 2016-02-13 (×5): 1
  Filled 2016-02-08 (×5): qty 1

## 2016-02-08 MED ORDER — ENOXAPARIN SODIUM 40 MG/0.4ML ~~LOC~~ SOLN
40.0000 mg | SUBCUTANEOUS | Status: DC
  Administered 2016-02-08 – 2016-02-12 (×5): 40 mg via SUBCUTANEOUS
  Filled 2016-02-08 (×5): qty 0.4

## 2016-02-08 MED ORDER — LEVETIRACETAM 500 MG PO TABS
1500.0000 mg | ORAL_TABLET | Freq: Two times a day (BID) | ORAL | Status: DC
  Filled 2016-02-08: qty 3

## 2016-02-08 MED ORDER — VANCOMYCIN HCL IN DEXTROSE 1-5 GM/200ML-% IV SOLN
1000.0000 mg | Freq: Once | INTRAVENOUS | Status: AC
Start: 2016-02-08 — End: 2016-02-08
  Administered 2016-02-08: 1000 mg via INTRAVENOUS
  Filled 2016-02-08: qty 200

## 2016-02-08 NOTE — ED Triage Notes (Signed)
Pt brought in by EMS due to having fever, increasing lethargy, and decreased urine output. Pt has hx of MS. Pt has chronic foley and PEG tube. Pt is nonverbal.

## 2016-02-08 NOTE — Progress Notes (Signed)
Patient arrived from ER via stretcher accompanied by ER tech. PIV, suprapubic catheter, and peg tube intact. Pt placed on tele and vitals are stable on room air. Pt does not appear to be in any acute distress. Will continue to assess

## 2016-02-08 NOTE — Progress Notes (Signed)
Pharmacy Antibiotic Note  FREEMON REATH is a 41 y.o. male admitted on 02/08/2016 with sepsis.  Pharmacy has been consulted for Vancocin and Zosyn dosing.  Plan: Vancomycin 1000mg  IV every 8 hours.  Goal trough 15-20 mcg/mL. Zosyn 3.375g IV q8h (4 hour infusion).  Height: 6\' 4"  (193 cm) Weight: 179 lb 0.2 oz (81.2 kg) IBW/kg (Calculated) : 86.8  Temp (24hrs), Avg:99.3 F (37.4 C), Min:98.8 F (37.1 C), Max:99.8 F (37.7 C)   Recent Labs Lab 02/08/16 1609 02/08/16 1635  WBC 33.6*  --   CREATININE 0.62  --   LATICACIDVEN  --  2.04*    Estimated Creatinine Clearance: 138.2 mL/min (by C-G formula based on SCr of 0.62 mg/dL).    No Known Allergies   Thank you for allowing pharmacy to be a part of this patient's care.  Wynona Neat, PharmD, BCPS  02/08/2016 11:10 PM

## 2016-02-08 NOTE — H&P (Signed)
[facility] Admission History and Physical Service Pager: [phone]  Patient name: Terry Palmer Medical record number: [id number] Date of birth: 1975/06/09 Age: 41 y.o. Gender: male  Primary Care Provider: [name], MD Consultants: ? Neurology  Code Status: Full (obtained on admission from his wife)  Chief Complaint: Emesis and decreased urine output  Assessment and Plan: TAELOR FRAGOZA is a 41 y.o. male presenting with emesis and decreased urine output. PMH is significant for multiple sclerosis, asthma, neurogenic bladder with chronic suprapubic catheter, contractures.   Possible Sepsis:  qSOFA 1 for altered mental status. He has 3/4 SIRS (fever, tachycardia and leukocytosis). Possible source of infection is urinary tract. Lactic acid mildly elevated to 2.04 which could also be due to dehydration. He has leukocytosis with left shift. He is also on chronic steroids for MS. Chest x-ray negative for pneumonia but he could have aspiration pneumonitis which could evolve into aspiration pneumonia. His symptoms and UA are concerning for pyelonephritis. However, urine was obtained from the his chronic supra pubic cath. Cannot rule out influenza. Hard to rule out intracranial process given his underlying comorbidity. He has history of seizure. He is on Keppra. He is also on Wellbutrin which could reduce his seizure threshold. Obviously more sleepier than his usual per family member. He is on Norco when necessary at home but has no pin point pupil. Abdominal exam benign to think of acute abdomen. EKG read as atrial fibrillation but notable P waves. It is also unchanged from his old EKG. Status post Vanc/Zosyn and 1 L normal saline bolus in ED.  -Admit to telemetry. Attending Dr. Ree Kida -CT head -EEG -Continue vancomycin and Zosyn -Follow up cultures -Influenza PCR -Trend lactic acid -Give 1 L bolus followed by D5-1/2 NS@100ml /hr -Change suprapubic  catheter -Continue home Keppra -Hold home Wellbutrin -Seizure precaution -Consider neuro consult in the morning -Nothing by mouth -Nutrition consult -CBC and BMP in the morning  Multiple sclerosis with quadriparesis: patient with muscular atrophy and muscular spasticity in his upper and lower extremities. Also with dysphagia (G-tube dependent) and neurogenic bladder (with suprapubic catheter). He is on prednisone 5 mg daily at home. Used to follow up with [facility] but couldn't for the last one year due to transportation.  -Continue home prednisone -Neuro consult in the morning -Continue home baclofen 20 mg 4 times a day. Hold home tizanidine -Continue home diazepam  Seizure disorder: Patient on Keppra 1500 mg twice a day at home. He is also on Wellbutrin which could reduce his seizures threshold. Unclear if he is postictal from seizure -Continue home Keppra -Discontinue Wellbutrin -EEG -Neuro consult in the morning -Seizure precaution  UTI/? Pyelonephritis: UA very dirty but urine was obtained from suprapubic cath. Patient has fever and leukocytosis.  -We'll continue antibiotic as above -Follow urine culture -Change suprapubic catheter -Strict input and output -Consider renal ultrasound or CT abdomen if no improvement  Neurogenic bladder with suprapubic catheter: Patient on dantrolene 200 milligrams at bedtime. Suprapubic cath changed on 02/03/2016 -Continue home dantrolene -Change suprapubic catheter  Dysphagia/G-tube dependent: On Osmolite 1.5, at 75 m/h from 8 AM to 11 PM at home -Hold home feed -IV fluid as above -Nutrition consult  Asthma: On DuoNeb nebulizer at home. History of cough/? Choking concerning for aspiration pneumonitis/pneumonia. Lung exam with diminished air sounds but no wheezes or crackles anteriorlyChest x-ray negative.  -Antibiotic as above -Continue home DuoNeb -Oxygen as needed  FEN/GI: -Nothing by mouth. Meds via  G-tube -Fluid  as above  Prophylaxis: -Lovenox  Disposition: Admit to telemetry pending clinical improvement and further evaluation  History of Present Illness:  Terry Palmer is a 41 y.o. male presenting with emesis, fever and decreased urine output.  At baseline, patient is nonverbal and responds yes by blinking his eyes once per family. He did not do this for me on admission except when I tried to examine his eyes. History was provided by his wife and supplemented by his mother.  Patient was in his usual state of health until 7 PM yesterday. Then he started moaning and had emesis which was food content, basically his Osmolite. Emesis was nonbloody. He also had intermittent cough, on and off. Wife reports trouble breathing after emesis which looked like choking with upper body movements. Breathing improved with albuterol nebulizer.  He had 3 watery bowel movements back-to-back. Bowel movement is usually loose at baseline but more watery this time. Stool wss nonbloody. He also had a temperature to 100.65F.   About 9 PM last night he felt better and slept overnight.  This morning he was "lethargic", "not himself" and had fever to 100.4. He was not responding to family member as usual. He had only 500 cc urine early this morning, less on his baseline. Wife states that his suprapubic catheter sometimes get occluded and they have to flush it with water which didn't help this time around.   Family denies sick contacts, runny nose or congestion.   Patient used to follow up with [facility] for his multiple sclerosis. Per patient's mother, he couldn't see his neurologist in a year due to transportation issues. She also didn't see a urologist in 2 years. Suprapubic cath which is changed weekly by [facility]. Last change was on 02/03/2016. He also have a G-tube which is [facility]. He is on Osmolite 1.5 cal , at 70ml/hr from 8 AM to 11 pm daily at home.  ED course: Temp 99.4, HR 101,  RR 19, BP 107/73, sat 100% on 1L on arrival to ED. CMP normal except for Na 133. CBC with leukocytosis to 33.6 with left shift. Lactic acid 2.04. CXR normal. CBG 86. UA with many bacteria, small Hgb, moderate leuk and positive nitrite (obtained from suprapubic catheter). EKG read as atrial fibrillation without RVR but appears to have P waves in leads II. Blood and urine culture were obtained. He was started on Vanco and Zosyn. He was given a liter of normal saline bolus. Family medicine was called to admit for possible sepsis and possible aspiration pneumonia.   Review Of Systems: Per HPI with the following additions:  ROS Not possible due to patient's condition. Patient is nonverbal Patient Active Problem List   Diagnosis Date Noted  . Encephalopathy   . Bacteremia 05/27/2015  . Sepsis ([place]) 05/25/2015  . Altered mental state 05/25/2015  . Altered mental status 05/25/2015  . Fever, unspecified 03/04/2015  . Quadriplegia and quadriparesis ([place]) 12/24/2014  . 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 Protein-calorie malnutrition, severe ([place]) 10/21/2014  . Sepsis secondary to UTI ([place]) 10/20/2014  . Leukocytosis 10/20/2014  . Acute respiratory failure with hypoxia ([place]) 10/20/2014  . Seizure disorder ([place]) 10/20/2014  . Aspiration pneumonitis ([place]) 10/20/2014  . Neurogenic bladder 05/28/2011  . Multiple sclerosis ([place]) 04/20/2011  . FTT (failure to thrive) in adult 04/20/2011  . Sacral decubitus ulcer, stage II 04/20/2011  . Quadriparesis (muscle weakness) ([place]) 03/12/2011    Past Medical History:  Past Medical History:  Diagnosis Date  . Aspiration pneumonia ([place]) 01/20/2013  . Bladder calculi   . Childhood asthma   . 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 Depression   . Dysphagia   . 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 MS (multiple sclerosis) ([place])   . Neurogenic bladder   . Neuromuscular disorder ([place])     Quadraperesis  . Normocytic anemia 05/28/2011  . Quadriparesis (muscle weakness) ([place]) 03/12/2011  . Quadriplegia and quadriparesis ([place]) 12/24/2014  . Recurrent upper respiratory infection (URI)   . Recurrent UTI   . Shortness of breath     Past Surgical History: Past Surgical History:  Procedure Laterality Date  . GASTROSTOMY  04/16/2011   Procedure: GASTROSTOMY;  Surgeon: Zenovia Jarred, MD;  Location: [place];  Service: General;  Laterality: N/A;  Open G-Tube placement  . LUMBAR PUNCTURE  10/12/2002    Social History: Social History  Substance Use Topics  . Smoking status: Former Smoker    Types: Cigarettes    Quit date: 02/02/1999  . Smokeless tobacco: Former Systems developer  . Alcohol use No   Additional social history: none  Please also refer to relevant sections of EMR.  Family History: Family History  Problem Relation Age of Onset  . Asthma Mother    (If not completed, MUST add something in)  Allergies and Medications: No Known Allergies No current facility-administered medications on file prior to encounter.    Current Outpatient Prescriptions on File Prior to Encounter  Medication Sig Dispense Refill  . acetaminophen (TYLENOL) 500 MG tablet Take 1 tablet (500 mg total) by mouth every 8 (eight) hours as needed for fever (or pain). (Patient taking differently: Take 1,000 mg by mouth 2 (two) times daily. ) 30 tablet 0  . baclofen (LIORESAL) 20 MG tablet Place 1 tablet (20 mg total) into feeding tube 4 (four) times daily. 120 each 5  . bisacodyl (DULCOLAX) 10 MG suppository Place 10 mg rectally once as needed for moderate constipation.    Marland Kitchen buPROPion (WELLBUTRIN) 75 MG tablet Place 1 tablet (75 mg total) into feeding tube 3 (three) times daily. 90 tablet 1  . cholecalciferol (VITAMIN D) 1000 UNITS tablet Take 1 tablet (1,000 Units total) by mouth 2 (two) times daily. (Patient taking differently: Give 5,000 Units by tube 2 (two) times daily. ) 60 tablet 1  . Cranberry Juice Powder  425 MG CAPS 1 capsule (425 mg total) by PEG Tube route 2 (two) times daily. 90 each 0  . dantrolene (DANTRIUM) 50 MG capsule Give 200 mg by tube at bedtime.     . diazepam (VALIUM) 5 MG tablet Place 5 mg into feeding tube at bedtime as needed for anxiety (sleep).     . fexofenadine (ALLEGRA) 180 MG tablet Place 180 mg into feeding tube daily.    . Garlic Oil 123XX123 MG CAPS Give 1,000 mg by tube daily with breakfast.     . HYDROcodone-acetaminophen (NORCO) 7.5-325 MG per tablet Place 1 tablet into feeding tube daily as needed (pain).     Marland Kitchen ipratropium-albuterol (DUONEB) 0.5-2.5 (3) MG/3ML SOLN Take 3 mLs by nebulization 2 (two) times daily.    . Lactobacillus (ACIDOPHILUS PO) Place 1 capsule into feeding tube daily.     Marland Kitchen levETIRAcetam (KEPPRA) 500 MG tablet Take 3 tablets (1,500 mg total) by mouth 2 (two) times daily. 180 tablet 1  . Multiple Vitamin (MULTIVITAMIN WITH MINERALS) TABS tablet Place 1 tablet into feeding tube daily. 30 tablet 0  .  NALTREXONE HCL PO Give 4.5 mg by tube at bedtime.    Marland Kitchen neomycin-bacitracin-polymyxin (NEOSPORIN) [phone] ointment Apply 1 application topically 4 (four) times daily as needed (abraisions, irritations).    . Nutritional Supplements (FEEDING SUPPLEMENT, OSMOLITE 1.5 CAL,) LIQD Place 80 mL/hr into feeding tube See admin instructions. For 14 hours a day (Patient taking differently: Place 75 mL/hr into feeding tube See admin instructions. For 14 hours a day) 1000 mL 0  . nystatin (MYCOSTATIN/NYSTOP) 100000 UNIT/GM POWD Apply 1 g topically 3 (three) times daily. (Patient taking differently: Apply 1 g topically daily. Applies to groin) 15 g 0  . omeprazole (PRILOSEC) 2 mg/mL SUSP Place 20 mLs (40 mg total) into feeding tube daily. 600 mL 1  . polyethylene glycol (MIRALAX / GLYCOLAX) packet Place 17 g into feeding tube daily. (Patient taking differently: Place 17 g into feeding tube every evening. ) 30 each 1  . polyvinyl alcohol (LIQUIFILM TEARS) 1.4 % ophthalmic  solution Place 1 drop into both eyes 3 (three) times daily. (Patient taking differently: Place 1 drop into both eyes 2 (two) times daily. ) 15 mL 0  . predniSONE 5 MG/ML concentrated solution Place 2 mLs (10 mg total) into feeding tube daily with breakfast. (Patient taking differently: Place 5 mg into feeding tube daily with breakfast. ) 10 mL 0  . Saline (SIMPLY SALINE) 0.9 % AERS Place 1 spray into both nostrils as needed (for nasal conjestion).    Marland Kitchen senna (SENOKOT) 8.6 MG TABS tablet Take 2 tablets by mouth at bedtime.     . sodium phosphate (FLEET) enema Place 1 enema rectally once as needed (for constipation). follow package directions    . tiZANidine (ZANAFLEX) 2 MG tablet Take 1 tablet (2 mg total) by mouth every 6 (six) hours as needed for muscle spasms. 120 tablet 0  . vitamin C (ASCORBIC ACID) 500 MG tablet Place 500 mg into feeding tube 2 (two) times daily.    . Water For Irrigation, Sterile (FREE WATER) SOLN Place 240 mLs into feeding tube every 4 (four) hours. 1000 mL 1    Objective: BP 107/70 (BP Location: Right Arm)   Pulse 98   Temp 99.4 F (37.4 C) (Rectal)   Resp 14   Ht 6\' 4"  (1.93 m)   Wt 175 lb (79.4 kg)   SpO2 98%   BMI 21.30 kg/m  Exam:   GEN: frail, lying in bed, nonverbal, doesn't follow command Head: normocephalic and atraumatic  Eyes: conjunctiva without injection, sclera anicteric, blinked during eye exam Nares: no rhinorrhea, congestion or erythema, [place] in place Oropharynx: without erythema or exudation, mucus membrane slightly dry from mouth breathing CVS: RRR, nl s1 & s2, no murmurs, no edema RESP: on 1L by [place], no IWOB, CTAB anteriorly, bowel sounds present & normal, soft, NTND, no guarding, no rebound. G-tube in place. No surrounding skin erythema GU: Suprapubic catheter in place. No surrounding skin erythema.  MSK: Significant muscle wasting, spasticity in upper and lower extremities, limbs in flexion except at his wrists Skin: No apparent skin lesions   NEURO: Intermittently moves his extremities, blinked his eyes during the exam  Labs and Imaging: CBC BMET   Recent Labs Lab 02/08/16 1609  WBC 33.6*  HGB 14.2  HCT 42.6  PLT 228    Recent Labs Lab 02/08/16 1609  NA 133*  K 3.7  CL 96*  CO2 25  BUN 12  CREATININE 0.62  GLUCOSE 86  CALCIUM 9.6     Ct Head  Wo Contrast  Result Date: 02/08/2016 CLINICAL DATA:  Altered mental status EXAM: CT HEAD WITHOUT CONTRAST TECHNIQUE: Contiguous axial images were obtained from the base of the skull through the vertex without intravenous contrast. COMPARISON:  05/25/2015 FINDINGS: Brain: Advanced generalized brain atrophy. No sign of acute infarction, mass lesion, hemorrhage, hydrocephalus or extra-axial collection. Vascular: No abnormal vascular finding. Skull: No acute finding. Sinuses/Orbits: Clear/normal Other: None IMPRESSION: Advanced diffuse brain atrophy. Presumably this secondary to chronic multiple sclerosis. No acute or reversible finding. Electronically Signed   By: Nelson Chimes M.D.   On: 02/08/2016 21:04   Dg Chest Port 1 View  Result Date: 02/08/2016 CLINICAL DATA:  Acute onset of fever, worsening lethargy and decreased urinary output. Initial encounter. EXAM: PORTABLE CHEST 1 VIEW COMPARISON:  Chest radiograph from 05/25/2015 FINDINGS: The lungs are hypoexpanded. There is no evidence of focal opacification, pleural effusion or pneumothorax. The cardiomediastinal silhouette is within normal limits. No acute osseous abnormalities are seen. IMPRESSION: Lungs hypoexpanded.  Lungs remain grossly clear. Electronically Signed   By: Garald Balding M.D.   On: 02/08/2016 16:53    Mercy Riding, MD 02/08/2016, 6:30 PM PGY-2, [employer] Intern pager: [phone], text pages welcome

## 2016-02-08 NOTE — Progress Notes (Signed)
FPTS Interim Progress Note  S: Received a page from patient's nurse about patient's lactic acid that has gone from 2.04 to 3.3 status post 2 L of normal saline. Paged CCM and talked to Dr. Oletta Darter who recommended giving him more fluids and trending lactic acid  O: BP 120/65 (BP Location: Right Arm)   Pulse (!) 103   Temp 98.8 F (37.1 C) (Axillary)   Resp 18   Ht 6\' 4"  (1.93 m)   Wt 179 lb 0.2 oz (81.2 kg)   SpO2 99%   BMI 21.79 kg/m     A/P: Lactic acidosis at 3.3.  -Normal saline bolus 1 L  Mercy Riding, MD 02/08/2016, 11:36 PM PGY-2, [place] pager [phone]

## 2016-02-08 NOTE — Progress Notes (Signed)
CRITICAL VALUE ALERT  Critical value received:  Lactic acid 3.3  Date of notification:  02/08/2016  Time of notification:  2325  Critical value read back:Yes.    Nurse who received alert:  Alvester Chou RN  MD notified (1st page):  Wendee Beavers MD  Time of first page:  2330  MD notified (2nd page):  Time of second page:  Responding MD:  Wendee Beavers MD  Time MD responded:  2330

## 2016-02-08 NOTE — Progress Notes (Signed)
Received order to replace suprapubic catheter. There are currently no inpatient RNs in house that are certified to replace suprapubic catheters. Will attempt to call units again when day shift arrive to see if there is a Restaurant manager, fast food and make MD aware.

## 2016-02-08 NOTE — ED Provider Notes (Signed)
[facility] DEPT Provider Note   CSN: [id number] Arrival date & time: 02/08/16  1605     History   Chief Complaint Chief Complaint  Patient presents with  . Fever    HPI Terry Palmer is a 41 y.o. male.  The history is provided by the EMS personnel. No language interpreter was used.  Fever      Terry Palmer is a 41 y.o. male who presents to the Emergency Department complaining of fever, AMS.  Level V caveat due to AMS.  History is provided by EMS. Patient is coming from home with complaint of fever to 103 since last night. He has a history of multiple sclerosis and at baseline is nonverbal and communicates with blinking. Per report he has experienced fevers with one episode of emesis, no additional symptoms. Family report decreased urinary output and darker color urine.  Past Medical History:  Diagnosis Date  . Aspiration pneumonia ([place]) 01/20/2013  . Bladder calculi   . Childhood asthma   . 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 Depression   . Dysphagia   . 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 MS (multiple sclerosis) ([place])   . Neurogenic bladder   . Neuromuscular disorder ([place])    Quadraperesis  . Normocytic anemia 05/28/2011  . Quadriparesis (muscle weakness) ([place]) 03/12/2011  . Quadriplegia and quadriparesis ([place]) 12/24/2014  . Recurrent upper respiratory infection (URI)   . Recurrent UTI   . Shortness of breath     Patient Active Problem List   Diagnosis Date Noted  . Encephalopathy   . Bacteremia 05/27/2015  . Sepsis ([place]) 05/25/2015  . Altered mental state 05/25/2015  . Altered mental status 05/25/2015  . Fever, unspecified 03/04/2015  . Quadriplegia and quadriparesis ([place]) 12/24/2014  . 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 Protein-calorie malnutrition, severe ([place]) 10/21/2014  . Sepsis secondary to UTI ([place]) 10/20/2014  . Leukocytosis 10/20/2014  . Acute respiratory failure  with hypoxia ([place]) 10/20/2014  . Seizure disorder ([place]) 10/20/2014  . Aspiration pneumonitis ([place]) 10/20/2014  . Neurogenic bladder 05/28/2011  . Multiple sclerosis ([place]) 04/20/2011  . FTT (failure to thrive) in adult 04/20/2011  . Sacral decubitus ulcer, stage II 04/20/2011  . Quadriparesis (muscle weakness) ([place]) 03/12/2011    Past Surgical History:  Procedure Laterality Date  . GASTROSTOMY  04/16/2011   Procedure: GASTROSTOMY;  Surgeon: Zenovia Jarred, MD;  Location: [facility];  Service: General;  Laterality: N/A;  Open G-Tube placement  . LUMBAR PUNCTURE  10/12/2002       Home Medications    Prior to Admission medications   Medication Sig Start Date End Date Taking? Authorizing Provider  acetaminophen (TYLENOL) 500 MG tablet Take 1 tablet (500 mg total) by mouth every 8 (eight) hours as needed for fever (or pain). Patient taking differently: Take 1,000 mg by mouth 2 (two) times daily.  03/05/15  Yes Debbe Odea, MD  baclofen (LIORESAL) 20 MG tablet Place 1 tablet (20 mg total) into feeding tube 4 (four) times daily. 07/13/15  Yes Kathrynn Ducking, MD  bisacodyl (DULCOLAX) 10 MG suppository Place 10 mg rectally once as needed for moderate constipation.   Yes Historical Provider, MD  buPROPion (WELLBUTRIN) 75 MG tablet Place 1 tablet (75 mg total) into feeding tube 3 (three) times daily. 01/24/13  Yes Bonnielee Haff, MD  cholecalciferol (VITAMIN D) 1000 UNITS tablet Take 1 tablet (1,000 Units total) by mouth 2 (two) times daily. Patient  taking differently: Give 5,000 Units by tube 2 (two) times daily.  01/24/13  Yes Bonnielee Haff, MD  Cranberry Juice Powder 425 MG CAPS 1 capsule (425 mg total) by PEG Tube route 2 (two) times daily. 01/24/13  Yes Bonnielee Haff, MD  dantrolene (DANTRIUM) 50 MG capsule Give 200 mg by tube at bedtime.    Yes Historical Provider, MD  diazepam (VALIUM) 5 MG tablet Place 5 mg into feeding tube at bedtime as needed for anxiety (sleep).    Yes Historical Provider,  MD  fexofenadine (ALLEGRA) 180 MG tablet Place 180 mg into feeding tube daily.   Yes Historical Provider, MD  Garlic Oil 123XX123 MG CAPS Give 1,000 mg by tube daily with breakfast.    Yes Historical Provider, MD  HYDROcodone-acetaminophen (NORCO) 7.5-325 MG per tablet Place 1 tablet into feeding tube daily as needed (pain).    Yes Historical Provider, MD  ipratropium-albuterol (DUONEB) 0.5-2.5 (3) MG/3ML SOLN Take 3 mLs by nebulization 2 (two) times daily.   Yes Historical Provider, MD  Lactobacillus (ACIDOPHILUS PO) Place 1 capsule into feeding tube daily.    Yes Historical Provider, MD  levETIRAcetam (KEPPRA) 500 MG tablet Take 3 tablets (1,500 mg total) by mouth 2 (two) times daily. 05/29/15  Yes Jule Ser, DO  Multiple Vitamin (MULTIVITAMIN WITH MINERALS) TABS tablet Place 1 tablet into feeding tube daily. 01/24/13  Yes Bonnielee Haff, MD  NALTREXONE HCL PO Give 4.5 mg by tube at bedtime.   Yes Historical Provider, MD  neomycin-bacitracin-polymyxin (NEOSPORIN) [phone] ointment Apply 1 application topically 4 (four) times daily as needed (abraisions, irritations).   Yes Historical Provider, MD  Nutritional Supplements (FEEDING SUPPLEMENT, OSMOLITE 1.5 CAL,) LIQD Place 80 mL/hr into feeding tube See admin instructions. For 14 hours a day Patient taking differently: Place 75 mL/hr into feeding tube See admin instructions. For 14 hours a day 05/29/15  Yes Jule Ser, DO  nystatin (MYCOSTATIN/NYSTOP) 100000 UNIT/GM POWD Apply 1 g topically 3 (three) times daily. Patient taking differently: Apply 1 g topically daily. Applies to groin 01/24/13  Yes Bonnielee Haff, MD  omeprazole (PRILOSEC) 2 mg/mL SUSP Place 20 mLs (40 mg total) into feeding tube daily. 01/24/13  Yes Bonnielee Haff, MD  polyethylene glycol (MIRALAX / GLYCOLAX) packet Place 17 g into feeding tube daily. Patient taking differently: Place 17 g into feeding tube every evening.  01/24/13  Yes Bonnielee Haff, MD  polyvinyl alcohol  (LIQUIFILM TEARS) 1.4 % ophthalmic solution Place 1 drop into both eyes 3 (three) times daily. Patient taking differently: Place 1 drop into both eyes 2 (two) times daily.  01/24/13  Yes Bonnielee Haff, MD  predniSONE 5 MG/ML concentrated solution Place 2 mLs (10 mg total) into feeding tube daily with breakfast. Patient taking differently: Place 5 mg into feeding tube daily with breakfast.  11/02/14  Yes Robbie Lis, MD  Saline (SIMPLY SALINE) 0.9 % AERS Place 1 spray into both nostrils as needed (for nasal conjestion).   Yes Historical Provider, MD  senna (SENOKOT) 8.6 MG TABS tablet Take 2 tablets by mouth at bedtime.    Yes Historical Provider, MD  sodium phosphate (FLEET) enema Place 1 enema rectally once as needed (for constipation). follow package directions   Yes Historical Provider, MD  tiZANidine (ZANAFLEX) 2 MG tablet Take 1 tablet (2 mg total) by mouth every 6 (six) hours as needed for muscle spasms. 11/25/15  Yes Kathrynn Ducking, MD  vitamin C (ASCORBIC ACID) 500 MG tablet Place 500 mg into feeding  tube 2 (two) times daily.   Yes Historical Provider, MD  Water For Irrigation, Sterile (FREE WATER) SOLN Place 240 mLs into feeding tube every 4 (four) hours. 01/24/13  Yes Bonnielee Haff, MD    Family History Family History  Problem Relation Age of Onset  . Asthma Mother     Social History Social History  Substance Use Topics  . Smoking status: Former Smoker    Types: Cigarettes    Quit date: 02/02/1999  . Smokeless tobacco: Former Systems developer  . Alcohol use No     Allergies   Patient has no known allergies.   Review of Systems Review of Systems  Unable to perform ROS: Patient nonverbal  Constitutional: Positive for fever.     Physical Exam Updated Vital Signs BP 120/65 (BP Location: Right Arm)   Pulse (!) 103   Temp 98.8 F (37.1 C) (Axillary)   Resp 18   Ht 6\' 4"  (1.93 m)   Wt 179 lb 0.2 oz (81.2 kg)   SpO2 99%   BMI 21.79 kg/m   Physical Exam  Constitutional:  He appears well-nourished.  Chronically ill-appearing  HENT:  Head: Normocephalic and atraumatic.  Neck:  Neck contracted and flexed to the right  Cardiovascular: Regular rhythm.   No murmur heard. Tachycardic  Pulmonary/Chest: Effort normal. No respiratory distress.  Decreased air movement bilateraly  Abdominal: Soft. There is no tenderness. There is no rebound and no guarding.  PEG tube in left upper quadrant with surrounding area clean, dry, intact.  Genitourinary:  Genitourinary Comments:  Suprapubic catheter in place, 22 french.   Musculoskeletal: He exhibits no tenderness.  Contractures of all 4 extremities  Neurological: He is alert.  Nonverbal.  Skin: Skin is warm and dry. Capillary refill takes less than 2 seconds.  Psychiatric:  Unable to assess  Nursing note and vitals reviewed.    ED Treatments / Results  Labs (all labs ordered are listed, but only abnormal results are displayed) Labs Reviewed  COMPREHENSIVE METABOLIC PANEL - Abnormal; Notable for the following:       Result Value   Sodium 133 (*)    Chloride 96 (*)    All other components within normal limits  CBC WITH DIFFERENTIAL/PLATELET - Abnormal; Notable for the following:    WBC 33.6 (*)    Neutro Abs 28.8 (*)    Monocytes Absolute 2.4 (*)    All other components within normal limits  URINALYSIS, ROUTINE W REFLEX MICROSCOPIC - Abnormal; Notable for the following:    APPearance TURBID (*)    Hgb urine dipstick SMALL (*)    Ketones, ur 15 (*)    Nitrite POSITIVE (*)    Leukocytes, UA MODERATE (*)    All other components within normal limits  URINALYSIS, MICROSCOPIC (REFLEX) - Abnormal; Notable for the following:    Bacteria, UA MANY (*)    Squamous Epithelial / LPF 0-5 (*)    All other components within normal limits  LACTIC ACID, PLASMA - Abnormal; Notable for the following:    Lactic Acid, Venous 3.3 (*)    All other components within normal limits  I-STAT CG4 LACTIC ACID, ED - Abnormal;  Notable for the following:    Lactic Acid, Venous 2.04 (*)    All other components within normal limits  CULTURE, BLOOD (ROUTINE X 2)  CULTURE, BLOOD (ROUTINE X 2)  URINE CULTURE  MAGNESIUM  PHOSPHORUS  INFLUENZA PANEL BY PCR (TYPE A & B)  BASIC METABOLIC PANEL  CBC  LACTIC  ACID, PLASMA  I-STAT CG4 LACTIC ACID, ED    EKG  EKG Interpretation  Date/Time:  Wednesday February 08 2016 16:15:58 EST Ventricular Rate:  98 PR Interval:    QRS Duration: 97 QT Interval:  374 QTC Calculation: 478 R Axis:   16 Text Interpretation:  Atrial fibrillation Minimal ST depression, inferior leads Borderline prolonged QT interval Confirmed by Hazle Coca [id number]) on 02/08/2016 4:34:16 PM       Radiology Ct Head Wo Contrast  Result Date: 02/08/2016 CLINICAL DATA:  Altered mental status EXAM: CT HEAD WITHOUT CONTRAST TECHNIQUE: Contiguous axial images were obtained from the base of the skull through the vertex without intravenous contrast. COMPARISON:  05/25/2015 FINDINGS: Brain: Advanced generalized brain atrophy. No sign of acute infarction, mass lesion, hemorrhage, hydrocephalus or extra-axial collection. Vascular: No abnormal vascular finding. Skull: No acute finding. Sinuses/Orbits: Clear/normal Other: None IMPRESSION: Advanced diffuse brain atrophy. Presumably this secondary to chronic multiple sclerosis. No acute or reversible finding. Electronically Signed   By: Nelson Chimes M.D.   On: 02/08/2016 21:04   Dg Chest Port 1 View  Result Date: 02/08/2016 CLINICAL DATA:  Acute onset of fever, worsening lethargy and decreased urinary output. Initial encounter. EXAM: PORTABLE CHEST 1 VIEW COMPARISON:  Chest radiograph from 05/25/2015 FINDINGS: The lungs are hypoexpanded. There is no evidence of focal opacification, pleural effusion or pneumothorax. The cardiomediastinal silhouette is within normal limits. No acute osseous abnormalities are seen. IMPRESSION: Lungs hypoexpanded.  Lungs remain grossly clear.  Electronically Signed   By: Garald Balding M.D.   On: 02/08/2016 16:53    Procedures Procedures (including critical care time)  Medications Ordered in ED Medications  bisacodyl (DULCOLAX) suppository 10 mg (not administered)  baclofen (LIORESAL) tablet 20 mg (20 mg Per Tube Given 02/08/16 2123)  ipratropium-albuterol (DUONEB) 0.5-2.5 (3) MG/3ML nebulizer solution 3 mL (3 mLs Nebulization Not Given 02/08/16 2111)  dantrolene (DANTRIUM) capsule 200 mg (200 mg Per Tube Given 02/08/16 2123)  diazepam (VALIUM) tablet 5 mg (not administered)  predniSONE 5 MG/ML concentrated solution 5 mg (not administered)  senna (SENOKOT) tablet 17.2 mg (17.2 mg Oral Given 02/08/16 2123)  loratadine (CLARITIN) tablet 10 mg (not administered)  multivitamin with minerals tablet 1 tablet (not administered)  enoxaparin (LOVENOX) injection 40 mg (40 mg Subcutaneous Given 02/08/16 2122)  dextrose 5 %-0.45 % sodium chloride infusion ( Intravenous New Bag/Given 02/08/16 2123)  acidophilus (RISAQUAD) capsule 1 capsule (not administered)  levETIRAcetam (KEPPRA) 100 MG/ML solution 1,500 mg (1,500 mg Oral Given 02/08/16 2238)  vancomycin (VANCOCIN) IVPB 1000 mg/200 mL premix (1,000 mg Intravenous Given 02/09/16 0128)  piperacillin-tazobactam (ZOSYN) IVPB 3.375 g (not administered)  sodium chloride 0.9 % bolus 1,000 mL (1,000 mLs Intravenous New Bag/Given 02/08/16 1702)  piperacillin-tazobactam (ZOSYN) IVPB 3.375 g (0 g Intravenous Stopped 02/08/16 1811)  vancomycin (VANCOCIN) IVPB 1000 mg/200 mL premix (1,000 mg Intravenous New Bag/Given 02/08/16 1811)  sodium chloride 0.9 % bolus 1,000 mL (1,000 mLs Intravenous Given 02/08/16 2000)  piperacillin-tazobactam (ZOSYN) IVPB 3.375 g (3.375 g Intravenous Given 02/09/16 0023)  sodium chloride 0.9 % bolus 1,000 mL (1,000 mLs Intravenous Given 02/08/16 2342)     Initial Impression / Assessment and Plan / ED Course  I have reviewed the triage vital signs and the nursing notes.  Pertinent  labs & imaging results that were available during my care of the patient were reviewed by me and considered in my medical decision making (see chart for details).     Patient with history of multiple sclerosis  here with change in mental status, fevers at home. He is chronically ill appearing on examination. Based on report of fever, with episode of vomiting last night question episode of aspiration. He was treated with broad-spectrum antibiotics for possible aspiration pneumonia. UA indeterminate for UTI in setting of chronic catheter.  Medicine consulted for admission for further treatment.  Concern for developing sepsis given  fever and leukocytosis, mental status change.  Final Clinical Impressions(s) / ED Diagnoses   Final diagnoses:  Altered mental status    New Prescriptions Current Discharge Medication List       Quintella Reichert, MD 02/09/16 0140

## 2016-02-09 ENCOUNTER — Inpatient Hospital Stay (HOSPITAL_COMMUNITY): Payer: Medicare HMO

## 2016-02-09 DIAGNOSIS — M2450 Contracture, unspecified joint: Secondary | ICD-10-CM

## 2016-02-09 DIAGNOSIS — R4182 Altered mental status, unspecified: Secondary | ICD-10-CM

## 2016-02-09 DIAGNOSIS — G35 Multiple sclerosis: Secondary | ICD-10-CM

## 2016-02-09 DIAGNOSIS — Z9359 Other cystostomy status: Secondary | ICD-10-CM

## 2016-02-09 DIAGNOSIS — Z931 Gastrostomy status: Secondary | ICD-10-CM

## 2016-02-09 DIAGNOSIS — N3001 Acute cystitis with hematuria: Secondary | ICD-10-CM

## 2016-02-09 DIAGNOSIS — A419 Sepsis, unspecified organism: Principal | ICD-10-CM

## 2016-02-09 LAB — BASIC METABOLIC PANEL
ANION GAP: 12 (ref 5–15)
BUN: 8 mg/dL (ref 6–20)
CO2: 22 mmol/L (ref 22–32)
Calcium: 8.7 mg/dL — ABNORMAL LOW (ref 8.9–10.3)
Chloride: 105 mmol/L (ref 101–111)
Creatinine, Ser: 0.62 mg/dL (ref 0.61–1.24)
GFR calc Af Amer: >60 mL/min (ref >60)
GLUCOSE: 93 mg/dL (ref 65–99)
POTASSIUM: 3.6 mmol/L (ref 3.5–5.1)
Sodium: 139 mmol/L (ref 135–145)

## 2016-02-09 LAB — CBC
HEMATOCRIT: 36.6 % — AB (ref 39.0–52.0)
HEMOGLOBIN: 12.1 g/dL — AB (ref 13.0–17.0)
MCH: 30.8 pg (ref 26.0–34.0)
MCHC: 33.1 g/dL (ref 30.0–36.0)
MCV: 93.1 fL (ref 78.0–100.0)
Platelets: 202 10*3/uL (ref 150–400)
RBC: 3.93 MIL/uL — ABNORMAL LOW (ref 4.22–5.81)
RDW: 13.5 % (ref 11.5–15.5)
WBC: 28.5 10*3/uL — AB (ref 4.0–10.5)

## 2016-02-09 LAB — URINE CULTURE

## 2016-02-09 LAB — LACTIC ACID, PLASMA: Lactic Acid, Venous: 1.1 mmol/L (ref 0.5–1.9)

## 2016-02-09 LAB — VANCOMYCIN, TROUGH: Vancomycin Tr: 13 ug/mL — ABNORMAL LOW (ref 15–20)

## 2016-02-09 LAB — MRSA PCR SCREENING: MRSA by PCR: NEGATIVE

## 2016-02-09 MED ORDER — LEVETIRACETAM 100 MG/ML PO SOLN
1500.0000 mg | Freq: Two times a day (BID) | ORAL | Status: DC
  Administered 2016-02-09 – 2016-02-13 (×9): 1500 mg
  Filled 2016-02-09 (×9): qty 15

## 2016-02-09 MED ORDER — VANCOMYCIN HCL 10 G IV SOLR
1250.0000 mg | Freq: Three times a day (TID) | INTRAVENOUS | Status: AC
  Administered 2016-02-10 (×2): 1250 mg via INTRAVENOUS
  Filled 2016-02-09 (×3): qty 1250

## 2016-02-09 MED ORDER — RISAQUAD PO CAPS: 1.0000 | ORAL_CAPSULE | Freq: Every day | ORAL | Status: DC

## 2016-02-09 MED ORDER — IPRATROPIUM-ALBUTEROL 0.5-2.5 (3) MG/3ML IN SOLN
3.0000 mL | Freq: Two times a day (BID) | RESPIRATORY_TRACT | Status: DC
  Administered 2016-02-09 – 2016-02-13 (×8): 3 mL via RESPIRATORY_TRACT
  Filled 2016-02-09 (×8): qty 3

## 2016-02-09 MED ORDER — ACETAMINOPHEN 325 MG PO TABS: 650.0000 mg | ORAL_TABLET | Freq: Four times a day (QID) | ORAL | Status: DC | PRN

## 2016-02-09 MED ORDER — SENNOSIDES 8.8 MG/5ML PO SYRP
10.0000 mL | ORAL_SOLUTION | Freq: Every day | ORAL | Status: DC
  Administered 2016-02-09 – 2016-02-12 (×4): 10 mL
  Filled 2016-02-09 (×4): qty 10

## 2016-02-09 MED ORDER — TIZANIDINE HCL 2 MG PO TABS: 2.0000 mg | ORAL_TABLET | Freq: Four times a day (QID) | ORAL | Status: DC | PRN

## 2016-02-09 MED ORDER — BACID PO TABS
1.0000 | ORAL_TABLET | Freq: Every day | ORAL | Status: DC
  Administered 2016-02-10 – 2016-02-13 (×4): 1
  Filled 2016-02-09 (×4): qty 1

## 2016-02-09 NOTE — Progress Notes (Signed)
Initial Nutrition Assessment  DOCUMENTATION CODES:   Not applicable  INTERVENTION:   Reccomend TF Osmolite 1.5 @ 25 ml/hr via PEG and increase by 10 ml every 4 hours to a goal rate of 55 ml/hr.   30 mL Prostat once daily per tube.  Tube feeding regimen provides 2,080 calories and 97 grams protein. Free water: 1003 mL  NUTRITION DIAGNOSIS:   Inadequate oral intake related to inability to eat as evidenced by NPO status.   GOAL:   Patient will meet greater than or equal to 90% of their needs   MONITOR:   Labs, Weight trends, TF tolerance  REASON FOR ASSESSMENT:   Consult  (Patient on tube feed at home)  ASSESSMENT:   41 y/o male with PMH MS, suprapubic catheter, G-tube, contractures, neurogenic bladder, and asthma admitted with Sepsis. Likely urinary source. Patient unable to provide history. Family not present  Planned to consult family to discuss tube feed rate and formula at home with plans to begin tube feed during admission .No family present during consult. Nutrition Focused Physical Exam was performed on upper body and legs. Depletion related to quadriplegia. Per MD note, holding home tube feeds.Per MD note, home tube feeding regimen: Osmolite 1.5 at 75 mL/hr from 8 AM to 11 Pm. New tube feeding recommendations have been stated above.   Diet Order:  Diet NPO time specified  Skin:  Reviewed, no issues  Last BM:  2/1  Height:   Ht Readings from Last 1 Encounters:  02/08/16 6\' 4"  (1.93 m)    Weight:   Wt Readings from Last 1 Encounters:  02/08/16 179 lb 0.2 oz (81.2 kg)    Ideal Body Weight:  78 kg  BMI:  Body mass index is 21.79 kg/m.  Estimated Nutritional Needs:   Kcal:  2000-2200  Protein:  97-107 grams  Fluid:  2-2.2 L/day  EDUCATION NEEDS:   No education needs identified at this time  Juliann Pulse M.S. Nutrition Dietetic Intern

## 2016-02-09 NOTE — Care Management Note (Signed)
Case Management Note  Patient Details  Name: Terry Palmer MRN: [id number] Date of Birth: 05-17-1975  Subjective/Objective:             Patient admitted from home, nonverbal, contracted, bedbound, suprapubic cath, tube feeds. Spoke with patient's mother and his wife. Patient lives at home with wife and and family. Patient has Drain care 12-13 a day every day per his mother.  He has a Radiation protection practitioner and a hoyer. Family states he need specialized WC in order to use [facility]/ medicaid transportation to get to specialists. Confirmed with wife that patient is seen at home by Dr [name], of Doctors making house calls ([phone]) for PCP. Patient has not been able to get to specialist due to family having to pay out of pocket for stretcher transport via [place]. Medicaid assistance for stretcher transport not available per patient's wife. Referral made to [facility] for Rehab team to measure patient at the home for wheelchair after DC. Team will obtain orders from PCP and process through Medicaid. CM left VM for wife explaining referral had been placed and what to expect after DC.       Action/Plan:   Expected Discharge Date:                  Expected Discharge Plan:     In-House Referral:     Discharge planning Services  CM Consult  Post Acute Care Choice:  Durable Medical Equipment Choice offered to:  Spouse  DME Arranged:  Media planner DME Agency:  [place]:    [place] Agency:     Status of Service:  In process, will continue to follow  If discussed at Long Length of Stay Meetings, dates discussed:    Additional Comments:  Terry Collet, RN 02/09/2016, 4:03 PM

## 2016-02-09 NOTE — Progress Notes (Signed)
Family Medicine Teaching Service Daily Progress Note Intern Pager: [phone]  Patient name: Terry Palmer Medical record number: [id number] Date of birth: 12/16/1975 Age: 41 y.o. Gender: male  Primary Care Provider: [name], MD Consultants: neurology Code Status: Full  Pt Overview and Major Events to Date:  1/31- admitted to [facility] with emesis, decreased UOP  Assessment and Plan: Terry Palmer is a 41 y.o. male presenting with emesis and decreased urine output. PMH is significant for multiple sclerosis, asthma, neurogenic bladder with chronic suprapubic catheter, contractures.   Possible Sepsis:  qSOFA 1 for altered mental status. 3/4 SIRS (fever, tachycardia and leukocytosis). Possible source of infection is urinary tract. Lactic acid mildly elevated on admission to 2.04. He has leukocytosis with left shift. He is also on chronic steroids for MS. Chest x-ray negative for pneumonia but he could have aspiration pneumonitis which could evolve into aspiration pneumonia. His symptoms and UA are concerning for pyelonephritis. However, urine was obtained from the his chronic supra pubic cath. Flu negative, CT head negative. He has history of seizure, on Keppra. He is also on Wellbutrin which could reduce his seizure threshold. EKG read as atrial fibrillation but notable P waves. It is also unchanged from his old EKG. S/p 3 L normal saline boluses. -Continue vancomycin and Zosyn -urine and blood cx pending at this time -Trend lactic acid- this morning 1.1 -Continue D5-1/2 NS@100ml /hr -Change suprapubic catheter- nursing staff aware to get this done this morning -Continue home Keppra -Hold home Wellbutrin -Seizure precautions -EEG pending -consult to cardiology this morning regarding MS and seizure hx  -Nutrition consult, appreciate recommendations for tube feeds -CBC and BMP in the morning  Multiple sclerosis with quadriparesis: patient with muscular atrophy and muscular  spasticity in his upper and lower extremities. Also with dysphagia (G-tube dependent) and neurogenic bladder (with suprapubic catheter). He is on prednisone 5 mg daily at home. Used to follow up with [facility] but couldn't for the last one year due to transportation.  -Continue home prednisone -Neuro consult this morning -Continue home baclofen 20 mg 4 times a day. Hold home tizanidine -Continue home diazepam  Seizure disorder: Patient on Keppra 1500 mg twice a day at home. He is also on Wellbutrin which could reduce his seizures threshold. Unclear if he is postictal from seizure -Continue home Keppra -hold Wellbutrin -EEG pending -Neuro consult this morning -Seizure precautions  UTI/? Pyelonephritis: UA very dirty but urine was obtained from suprapubic cath. Patient has fever and leukocytosis.  -continue vanc/zosyn -Follow urine culture -Change suprapubic catheter -Strict input and output -Consider renal ultrasound or CT abdomen if no improvement  Neurogenic bladder with suprapubic catheter: Patient on dantrolene 200 milligrams at bedtime. Suprapubic cath changed on 02/03/2016 -Continue home dantrolene -Change suprapubic catheter  Dysphagia/G-tube dependent: On Osmolite 1.5, at 75 m/h from 8 AM to 11 PM at home -Hold home feed -IV fluid as above -Nutrition consult  Asthma: On DuoNeb nebulizer at home. History of cough/? Choking concerning for aspiration pneumonitis/pneumonia. Lung exam with diminished air sounds but no wheezes or crackles anteriorlyChest x-ray negative.  -Antibiotic as above -Continue home DuoNeb -Oxygen as needed  FEN/GI: -Nothing by mouth. Meds via G-tube -Fluid as above  Prophylaxis: -Lovenox  Disposition: pending clinical improvement  Subjective:  Terry Palmer is non-verbal, non-interactive. No family in room.   Objective: Temp:  [98.8 F (37.1 C)-99.9 F (37.7 C)] 99.9 F (37.7 C) (02/01 0527) Pulse Rate:  [94-103]  99 (02/01 0527) Resp:  [11-19]  17 (02/01 0527) BP: (104-126)/(65-80) 126/77 (02/01 0527) SpO2:  [98 %-100 %] 98 % (02/01 0527) Weight:  [175 lb (79.4 kg)-179 lb 0.2 oz (81.2 kg)] 179 lb 0.2 oz (81.2 kg) (01/31 2007) Physical Exam: General: chronically ill appearing man laying in bed in NAD Cardiovascular: tachycardic. Regular rhythm. No MRG Respiratory: coarse breath sounds in anterior lung fields bilaterally. No increased work of breathing. Saturating 97% on room air Abdomen: non-distended, +BS Extremities: contracted extremities, thin with muscle wasting, no edema or cyanosis  Laboratory:  Recent Labs Lab 02/08/16 1609 02/09/16 0223  WBC 33.6* 28.5*  HGB 14.2 12.1*  HCT 42.6 36.6*  PLT 228 202    Recent Labs Lab 02/08/16 1609 02/09/16 0223  NA 133* 139  K 3.7 3.6  CL 96* 105  CO2 25 22  BUN 12 8  CREATININE 0.62 0.62  CALCIUM 9.6 8.7*  PROT 7.8  --   BILITOT 0.6  --   ALKPHOS 87  --   ALT 23  --   AST 22  --   GLUCOSE 86 93   Lactic acid 2.04 >> 3.3 > 1.1  Imaging/Diagnostic Tests: Ct Head Wo Contrast  Result Date: 02/08/2016 CLINICAL DATA:  Altered mental status EXAM: CT HEAD WITHOUT CONTRAST TECHNIQUE: Contiguous axial images were obtained from the base of the skull through the vertex without intravenous contrast. COMPARISON:  05/25/2015 FINDINGS: Brain: Advanced generalized brain atrophy. No sign of acute infarction, mass lesion, hemorrhage, hydrocephalus or extra-axial collection. Vascular: No abnormal vascular finding. Skull: No acute finding. Sinuses/Orbits: Clear/normal Other: None IMPRESSION: Advanced diffuse brain atrophy. Presumably this secondary to chronic multiple sclerosis. No acute or reversible finding. Electronically Signed   By: Nelson Chimes M.D.   On: 02/08/2016 21:04   Dg Chest Port 1 View  Result Date: 02/08/2016 CLINICAL DATA:  Acute onset of fever, worsening lethargy and decreased urinary output. Initial encounter. EXAM: PORTABLE CHEST 1  VIEW COMPARISON:  Chest radiograph from 05/25/2015 FINDINGS: The lungs are hypoexpanded. There is no evidence of focal opacification, pleural effusion or pneumothorax. The cardiomediastinal silhouette is within normal limits. No acute osseous abnormalities are seen. IMPRESSION: Lungs hypoexpanded.  Lungs remain grossly clear. Electronically Signed   By: Garald Balding M.D.   On: 02/08/2016 16:53     Steve Rattler, DO 02/09/2016, 7:26 AM PGY-1, [place] Intern pager: [phone], text pages welcome

## 2016-02-09 NOTE — Progress Notes (Signed)
Pharmacy Antibiotic Note  Terry Palmer is a 41 y.o. male admitted on 02/08/2016 with sepsis.   Trough tonight - 13   Plan: Increase vanc to 1250 q8  Height: 6\' 4"  (193 cm) Weight: 179 lb 0.2 oz (81.2 kg) IBW/kg (Calculated) : 86.8  Temp (24hrs), Avg:99 F (37.2 C), Min:98 F (36.7 C), Max:99.9 F (37.7 C)   Recent Labs Lab 02/08/16 1609 02/08/16 1635 02/08/16 2232 02/09/16 0223 02/09/16 1755  WBC 33.6*  --   --  28.5*  --   CREATININE 0.62  --   --  0.62  --   LATICACIDVEN  --  2.04* 3.3* 1.1  --   VANCOTROUGH  --   --   --   --  13*    Estimated Creatinine Clearance: 138.2 mL/min (by C-G formula based on SCr of 0.62 mg/dL).    No Known Allergies  Levester Fresh, PharmD, BCPS, BCCCP Clinical Pharmacist 02/09/2016 6:54 PM

## 2016-02-09 NOTE — Progress Notes (Signed)
EEG completed; results pending.    

## 2016-02-09 NOTE — Progress Notes (Signed)
Per MD place patient on droplet & contact precautions even though flu PCR negative.

## 2016-02-09 NOTE — Procedures (Signed)
ELECTROENCEPHALOGRAM REPORT  Date of Study: 02/09/2016  Patient's Name: Terry Palmer MRN: [id number] Date of Birth: 1975-05-07  Referring Provider: Dr. Dossie Arbour  Clinical History: This is a 41 year old man with altered mental status  Medications: levETIRAcetam (KEPPRA) 100 MG/ML solution 1,500 mg  acetaminophen (TYLENOL) tablet 650 mg   acidophilus (RISAQUAD) capsule 1 capsule  baclofen (LIORESAL) tablet 20 mg  bisacodyl (DULCOLAX) suppository 10 mg  dantrolene (DANTRIUM) capsule 200 mg  dextrose 5 %-0.45 % sodium chloride infusion  diazepam (VALIUM) tablet 5 mg  enoxaparin (LOVENOX) injection 40 mg  ipratropium-albuterol (DUONEB) 0.5-2.5 (3) MG/3ML nebulizer solution 3 mL  loratadine (CLARITIN) tablet 10 mg  multivitamin with minerals tablet 1 tablet  piperacillin-tazobactam (ZOSYN) IVPB 3.375 g  predniSONE 5 MG/ML concentrated solution 5 mg  senna (SENOKOT) tablet 17.2 mg  vancomycin (VANCOCIN) IVPB 1000 mg/200 mL premix   Technical Summary: A multichannel digital EEG recording measured by the international 10-20 system with electrodes applied with paste and impedances below 5000 ohms performed as portable with EKG monitoring in an asleep patient.  Hyperventilation and photic stimulation were not performed.  The digital EEG was referentially recorded, reformatted, and digitally filtered in a variety of bipolar and referential montages for optimal display.   Description: The patient is asleep during the recording.The background consists of diffuse 4-5 Hz theta and 2-3 Hz delta slowing with additional occasional focal 4-5 Hz slowing over the left temporal region. There is an increase in slowing during sleep. Normal sleep architecture is not seen. Hyperventilation and photic stimulation were not performed. There were occasional left frontotemporal sharp waves seen.  There were no electrographic seizures seen.    EKG lead was unremarkable.  Impression: This asleep EEG is  abnormal due to the presence of: 1. Moderate diffuse slowing of the background 2. Focal slowing over the left temporal region 3. Occasional left frontotemporal sharp waves  Clinical Correlation of the above findings indicates diffuse cerebral dysfunction that is non-specific in etiology and can be seen with hypoxic/ischemic injury, toxic/metabolic encephalopathies, neurodegenerative disorders, or medication effect. Additional focal slowing over the left temporal region indicates focal cerebral dysfunction in this region suggestive of underlying structural or physiologic abnormality. There is a possible tendency for seizures to arise from the left frontotemporal region. Clinical correlation is advised.   Ellouise Newer, M.D.

## 2016-02-09 NOTE — Progress Notes (Signed)
02/09/16 1525 nursing MD order to replace Suprapubic catheter.  22cc of sterile water was pulled  from the balloon of the existing suprapubic cath. New suprapubic catheter inserted with no difficulty. Patient tolerated. Patient's RN notified of insertion.

## 2016-02-10 ENCOUNTER — Inpatient Hospital Stay (HOSPITAL_COMMUNITY): Payer: Medicare HMO

## 2016-02-10 DIAGNOSIS — B999 Unspecified infectious disease: Secondary | ICD-10-CM

## 2016-02-10 DIAGNOSIS — G40909 Epilepsy, unspecified, not intractable, without status epilepticus: Secondary | ICD-10-CM

## 2016-02-10 LAB — URINALYSIS, COMPLETE (UACMP) WITH MICROSCOPIC
Bilirubin Urine: NEGATIVE
GLUCOSE, UA: NEGATIVE mg/dL
Ketones, ur: NEGATIVE mg/dL
Nitrite: NEGATIVE
PROTEIN: NEGATIVE mg/dL
Specific Gravity, Urine: 1.004 — ABNORMAL LOW (ref 1.005–1.030)
pH: 6 (ref 5.0–8.0)

## 2016-02-10 LAB — BASIC METABOLIC PANEL
Anion gap: 9 (ref 5–15)
BUN: <5 mg/dL — ABNORMAL LOW (ref 6–20)
CO2: 22 mmol/L (ref 22–32)
Calcium: 8.6 mg/dL — ABNORMAL LOW (ref 8.9–10.3)
Chloride: 110 mmol/L (ref 101–111)
Creatinine, Ser: 0.78 mg/dL (ref 0.61–1.24)
GFR calc Af Amer: >60 mL/min (ref >60)
GLUCOSE: 115 mg/dL — AB (ref 65–99)
POTASSIUM: 2.8 mmol/L — AB (ref 3.5–5.1)
Sodium: 141 mmol/L (ref 135–145)

## 2016-02-10 LAB — CBC
HEMATOCRIT: 35.2 % — AB (ref 39.0–52.0)
Hemoglobin: 11.8 g/dL — ABNORMAL LOW (ref 13.0–17.0)
MCH: 30.9 pg (ref 26.0–34.0)
MCHC: 33.5 g/dL (ref 30.0–36.0)
MCV: 92.1 fL (ref 78.0–100.0)
PLATELETS: 166 10*3/uL (ref 150–400)
RBC: 3.82 MIL/uL — AB (ref 4.22–5.81)
RDW: 13.9 % (ref 11.5–15.5)
WBC: 16.2 10*3/uL — AB (ref 4.0–10.5)

## 2016-02-10 LAB — GLUCOSE, CAPILLARY
Glucose-Capillary: 138 mg/dL — ABNORMAL HIGH (ref 65–99)
Glucose-Capillary: 158 mg/dL — ABNORMAL HIGH (ref 65–99)

## 2016-02-10 MED ORDER — OSMOLITE 1.5 CAL PO LIQD
1000.0000 mL | ORAL | Status: DC
  Administered 2016-02-10 – 2016-02-13 (×4): 1000 mL
  Filled 2016-02-10 (×7): qty 1000

## 2016-02-10 MED ORDER — JEVITY 1.2 CAL PO LIQD: 1000.0000 mL | ORAL | Status: DC

## 2016-02-10 MED ORDER — PRO-STAT SUGAR FREE PO LIQD
30.0000 mL | Freq: Every day | ORAL | Status: DC
  Administered 2016-02-10 – 2016-02-13 (×4): 30 mL
  Filled 2016-02-10 (×4): qty 30

## 2016-02-10 MED ORDER — OSELTAMIVIR PHOSPHATE 75 MG PO CAPS
75.0000 mg | ORAL_CAPSULE | Freq: Two times a day (BID) | ORAL | Status: DC
  Administered 2016-02-10 – 2016-02-13 (×7): 75 mg
  Filled 2016-02-10 (×7): qty 1

## 2016-02-10 MED ORDER — POTASSIUM CHLORIDE 20 MEQ PO PACK
40.0000 meq | PACK | Freq: Two times a day (BID) | ORAL | Status: AC
  Administered 2016-02-10 – 2016-02-11 (×2): 40 meq
  Filled 2016-02-10 (×2): qty 2

## 2016-02-10 NOTE — Consult Note (Addendum)
NEURO HOSPITALIST CONSULT NOTE   Requestig physician: Dr. Ree Kida   Reason for Consult: AMS and spikes noted on EEG in patient with UTI   History obtained from:     Chart   HPI:                                                                                                                                          Terry Palmer is an 41 y.o. male brought to the hospital with emesis and decreased urine output. He was found to have fever, tachycardia and WBC 33. UA showed significant Nitrites and leucocytes. Patient has history of seizure and is on Keppra 1500 mg daily. In addition he is on Wellbutrin. Patietn was started on vancomycin and Zosyn. HE is followed out patient by [facility] and on Prednisone, Baclofen, Tizanidine and diazepam for spasticity. Due to contaminated initial UC this is being repeated. EEG showed background consists of diffuse 4-5 Hz theta and 2-3 Hz delta slowing with additional occasional focal 4-5 Hz slowing over the left temporal region. There is an increase in slowing during sleep. There were occasional left frontotemporal sharp waves seen.  There were no electrographic seizures seen.   In talking with the family he has had no seizure activity. His typical seizure is a staring episode but he has been acting normal. His normal activity is inability to talk or move extremities. HE blinks to talk and allow you to know he is ok.  He did so for me on consultation.   Past Medical History:  Diagnosis Date  . Aspiration pneumonia ([place]) 01/20/2013  . Bladder calculi   . Childhood asthma   . 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 Depression   . Dysphagia   . 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 MS (multiple sclerosis) ([place])   . Neurogenic bladder   . Neuromuscular disorder ([place])    Quadraperesis  . Normocytic anemia 05/28/2011  . Quadriparesis (muscle weakness) ([place]) 03/12/2011  . Quadriplegia and quadriparesis ([place]) 12/24/2014  . Recurrent  upper respiratory infection (URI)   . Recurrent UTI   . Shortness of breath     Past Surgical History:  Procedure Laterality Date  . GASTROSTOMY  04/16/2011   Procedure: GASTROSTOMY;  Surgeon: Zenovia Jarred, MD;  Location: [facility];  Service: General;  Laterality: N/A;  Open G-Tube placement  . LUMBAR PUNCTURE  10/12/2002    Family History  Problem Relation Age of Onset  . Asthma Mother       Social History:  reports that he quit smoking about 17 years ago. His smoking use included Cigarettes. He has quit using smokeless tobacco. He reports that he does not drink alcohol. His drug history is not on file.  No Known Allergies  MEDICATIONS:  Prior to Admission:  Prescriptions Prior to Admission  Medication Sig Dispense Refill Last Dose  . acetaminophen (TYLENOL) 500 MG tablet Take 1 tablet (500 mg total) by mouth every 8 (eight) hours as needed for fever (or pain). (Patient taking differently: Take 1,000 mg by mouth 2 (two) times daily. ) 30 tablet 0 02/08/2016 at Unknown time  . baclofen (LIORESAL) 20 MG tablet Place 1 tablet (20 mg total) into feeding tube 4 (four) times daily. 120 each 5 02/08/2016 at 2 doses  . bisacodyl (DULCOLAX) 10 MG suppository Place 10 mg rectally once as needed for moderate constipation.   Past Month at Unknown time  . buPROPion (WELLBUTRIN) 75 MG tablet Place 1 tablet (75 mg total) into feeding tube 3 (three) times daily. 90 tablet 1 02/08/2016 at Unknown time  . cholecalciferol (VITAMIN D) 1000 UNITS tablet Take 1 tablet (1,000 Units total) by mouth 2 (two) times daily. (Patient taking differently: Give 5,000 Units by tube 2 (two) times daily. ) 60 tablet 1 02/08/2016 at Unknown time  . Cranberry Juice Powder 425 MG CAPS 1 capsule (425 mg total) by PEG Tube route 2 (two) times daily. 90 each 0 02/08/2016 at Unknown time  . dantrolene (DANTRIUM) 50 MG  capsule Give 200 mg by tube at bedtime.    02/07/2016 at Unknown time  . diazepam (VALIUM) 5 MG tablet Place 5 mg into feeding tube at bedtime as needed for anxiety (sleep).    Past Month at Unknown time  . fexofenadine (ALLEGRA) 180 MG tablet Place 180 mg into feeding tube daily.   02/08/2016 at Unknown time  . Garlic Oil 123XX123 MG CAPS Give 1,000 mg by tube daily with breakfast.    02/08/2016 at Unknown time  . HYDROcodone-acetaminophen (NORCO) 7.5-325 MG per tablet Place 1 tablet into feeding tube daily as needed (pain).    Past Week at Unknown time  . ipratropium-albuterol (DUONEB) 0.5-2.5 (3) MG/3ML SOLN Take 3 mLs by nebulization 2 (two) times daily.   02/08/2016 at Unknown time  . Lactobacillus (ACIDOPHILUS PO) Place 1 capsule into feeding tube daily.    02/08/2016 at Unknown time  . levETIRAcetam (KEPPRA) 500 MG tablet Take 3 tablets (1,500 mg total) by mouth 2 (two) times daily. 180 tablet 1 02/08/2016 at Unknown time  . Multiple Vitamin (MULTIVITAMIN WITH MINERALS) TABS tablet Place 1 tablet into feeding tube daily. 30 tablet 0 02/08/2016 at Unknown time  . NALTREXONE HCL PO Give 4.5 mg by tube at bedtime.   02/07/2016 at Unknown time  . neomycin-bacitracin-polymyxin (NEOSPORIN) [phone] ointment Apply 1 application topically 4 (four) times daily as needed (abraisions, irritations).   Past Month at Unknown time  . Nutritional Supplements (FEEDING SUPPLEMENT, OSMOLITE 1.5 CAL,) LIQD Place 80 mL/hr into feeding tube See admin instructions. For 14 hours a day (Patient taking differently: Place 75 mL/hr into feeding tube See admin instructions. For 14 hours a day) 1000 mL 0 02/08/2016 at Unknown time  . nystatin (MYCOSTATIN/NYSTOP) 100000 UNIT/GM POWD Apply 1 g topically 3 (three) times daily. (Patient taking differently: Apply 1 g topically daily. Applies to groin) 15 g 0 02/08/2016 at Unknown time  . omeprazole (PRILOSEC) 2 mg/mL SUSP Place 20 mLs (40 mg total) into feeding tube daily. 600 mL 1 02/08/2016  at Unknown time  . polyethylene glycol (MIRALAX / GLYCOLAX) packet Place 17 g into feeding tube daily. (Patient taking differently: Place 17 g into feeding tube every evening. ) 30 each 1 02/07/2016 at Unknown time  .  polyvinyl alcohol (LIQUIFILM TEARS) 1.4 % ophthalmic solution Place 1 drop into both eyes 3 (three) times daily. (Patient taking differently: Place 1 drop into both eyes 2 (two) times daily. ) 15 mL 0 02/08/2016 at Unknown time  . predniSONE 5 MG/ML concentrated solution Place 2 mLs (10 mg total) into feeding tube daily with breakfast. (Patient taking differently: Place 5 mg into feeding tube daily with breakfast. ) 10 mL 0 02/08/2016 at Unknown time  . Saline (SIMPLY SALINE) 0.9 % AERS Place 1 spray into both nostrils as needed (for nasal conjestion).   Past Month at Unknown time  . senna (SENOKOT) 8.6 MG TABS tablet Take 2 tablets by mouth at bedtime.    02/07/2016 at Unknown time  . sodium phosphate (FLEET) enema Place 1 enema rectally once as needed (for constipation). follow package directions   Past Month at Unknown time  . tiZANidine (ZANAFLEX) 2 MG tablet Take 1 tablet (2 mg total) by mouth every 6 (six) hours as needed for muscle spasms. 120 tablet 0 Past Month at Unknown time  . vitamin C (ASCORBIC ACID) 500 MG tablet Place 500 mg into feeding tube 2 (two) times daily.   02/08/2016 at Unknown time  . Water For Irrigation, Sterile (FREE WATER) SOLN Place 240 mLs into feeding tube every 4 (four) hours. 1000 mL 1 02/08/2016 at Unknown time   Scheduled: . baclofen  20 mg Per Tube QID  . dantrolene  200 mg Per Tube QHS  . enoxaparin (LOVENOX) injection  40 mg Subcutaneous Q24H  . ipratropium-albuterol  3 mL Nebulization BID  . lactobacillus acidophilus  1 tablet Per Tube Daily  . levETIRAcetam  1,500 mg Per Tube BID  . loratadine  10 mg Per Tube Daily  . multivitamin with minerals  1 tablet Per Tube Daily  . piperacillin-tazobactam (ZOSYN)  IV  3.375 g Intravenous Q8H  . predniSONE   5 mg Per Tube Q breakfast  . sennosides  10 mL Per Tube QHS  . vancomycin  1,250 mg Intravenous Q8H     ROS:                                                                                                                                       History obtained from unobtainable from patient due to mental status     Blood pressure 112/60, pulse 96, temperature 98.5 F (36.9 C), temperature source Oral, resp. rate 20, height 6\' 4"  (1.93 m), weight 78 kg (172 lb), SpO2 96 %.   Neurologic Examination:  HEENT-  Normocephalic, no lesions, without obvious abnormality.  Normal external eye and conjunctiva.  Normal TM's bilaterally.  Normal auditory canals and external ears. Normal external nose, mucus membranes and septum.  Normal pharynx. Cardiovascular- S1, S2 normal, pulses palpable throughout   Lungs- chest clear, no wheezing, rales, normal symmetric air entry, Heart exam - S1, S2 normal, no murmur, no gallop, rate regular Abdomen- soft, non-tender; bowel sounds normal; no masses,  no organomegaly Extremities- less then 2 second capillary refill Lymph-no adenopathy palpable Musculoskeletal-no joint tenderness, deformity or swelling Skin-warm and dry, no hyperpigmentation, vitiligo, or suspicious lesions  Neurological Examination Mental Status: Eyes open, blinks to note he is able to hear me.  Cranial Nerves: II: blinks to threat III,IV, VI: ptosis on the right eye and dolls intact V,VII: face symmetric, facial light touch sensation normal bilaterally VIII: hearing normal bilaterally IX,X: unable to visualize XI: unable to assess XII: unable to visualize Motor: Bilateral UE in flexion contracture, Significant right LE spasticity and left leg is flexed at 90 degrees and crossed over his right leg.  spasticity in all 4 extremities.  Sensory: intact in all extremity Deep Tendon  Reflexes: 2+ and symmetric throughout Plantars: Toes fan with plantar stimulation       Lab Results: Basic Metabolic Panel:  Recent Labs Lab 02/08/16 1609 02/08/16 2232 02/09/16 0223 02/10/16 1019  NA 133*  --  139 141  K 3.7  --  3.6 2.8*  CL 96*  --  105 110  CO2 25  --  22 22  GLUCOSE 86  --  93 115*  BUN 12  --  8 <5*  CREATININE 0.62  --  0.62 0.78  CALCIUM 9.6  --  8.7* 8.6*  MG  --  2.2  --   --   PHOS  --  2.6  --   --     Liver Function Tests:  Recent Labs Lab 02/08/16 1609  AST 22  ALT 23  ALKPHOS 87  BILITOT 0.6  PROT 7.8  ALBUMIN 4.1   No results for input(s): LIPASE, AMYLASE in the last 168 hours. No results for input(s): AMMONIA in the last 168 hours.  CBC:  Recent Labs Lab 02/08/16 1609 02/09/16 0223 02/10/16 0724  WBC 33.6* 28.5* 16.2*  NEUTROABS 28.8*  --   --   HGB 14.2 12.1* 11.8*  HCT 42.6 36.6* 35.2*  MCV 93.4 93.1 92.1  PLT 228 202 166    Cardiac Enzymes: No results for input(s): CKTOTAL, CKMB, CKMBINDEX, TROPONINI in the last 168 hours.  Lipid Panel: No results for input(s): CHOL, TRIG, HDL, CHOLHDL, VLDL, LDLCALC in the last 168 hours.  CBG: No results for input(s): GLUCAP in the last 168 hours.  Microbiology: Results for orders placed or performed during the hospital encounter of 02/08/16  Blood Culture (routine x 2)     Status: None (Preliminary result)   Collection Time: 02/08/16  4:09 PM  Result Value Ref Range Status   Specimen Description BLOOD LEFT HAND  Final   Special Requests BOTTLES DRAWN AEROBIC AND ANAEROBIC 5CC  Final   Culture NO GROWTH < 24 HOURS  Final   Report Status PENDING  Incomplete  Blood Culture (routine x 2)     Status: None (Preliminary result)   Collection Time: 02/08/16  4:43 PM  Result Value Ref Range Status   Specimen Description BLOOD RIGHT HAND  Final   Special Requests BOTTLES DRAWN AEROBIC ONLY 5CC  Final   Culture NO  GROWTH < 24 HOURS  Final   Report Status PENDING  Incomplete   Urine culture     Status: Abnormal   Collection Time: 02/08/16  4:59 PM  Result Value Ref Range Status   Specimen Description URINE, RANDOM  Final   Special Requests NONE  Final   Culture MULTIPLE SPECIES PRESENT, SUGGEST RECOLLECTION (A)  Final   Report Status 02/09/2016 FINAL  Final  MRSA PCR Screening     Status: None   Collection Time: 02/09/16  3:59 PM  Result Value Ref Range Status   MRSA by PCR NEGATIVE NEGATIVE Final    Comment:        The GeneXpert MRSA Assay (FDA approved for NASAL specimens only), is one component of a comprehensive MRSA colonization surveillance program. It is not intended to diagnose MRSA infection nor to guide or monitor treatment for MRSA infections.     Coagulation Studies: No results for input(s): LABPROT, INR in the last 72 hours.  Imaging: Ct Head Wo Contrast  Result Date: 02/08/2016 CLINICAL DATA:  Altered mental status EXAM: CT HEAD WITHOUT CONTRAST TECHNIQUE: Contiguous axial images were obtained from the base of the skull through the vertex without intravenous contrast. COMPARISON:  05/25/2015 FINDINGS: Brain: Advanced generalized brain atrophy. No sign of acute infarction, mass lesion, hemorrhage, hydrocephalus or extra-axial collection. Vascular: No abnormal vascular finding. Skull: No acute finding. Sinuses/Orbits: Clear/normal Other: None IMPRESSION: Advanced diffuse brain atrophy. Presumably this secondary to chronic multiple sclerosis. No acute or reversible finding. Electronically Signed   By: Nelson Chimes M.D.   On: 02/08/2016 21:04   Dg Chest Port 1 View  Result Date: 02/10/2016 CLINICAL DATA:  Infection.  History of asthma. EXAM: PORTABLE CHEST 1 VIEW COMPARISON:  Chest x-rays since March 03, 2015 FINDINGS: The cardiomediastinal silhouette is stable. No pneumothorax. Suggested mild pulmonary venous congestion. No focal infiltrate. Air-filled prominent loops of bowel are seen below both the right and left hemidiaphragm. No other  interval changes. IMPRESSION: 1. Mild pulmonary venous congestion. 2. Air-filled mildly prominent loops of bowel under both the right and left hemidiaphragms. Electronically Signed   By: Dorise Bullion III M.D   On: 02/10/2016 14:05   Dg Chest Port 1 View  Result Date: 02/08/2016 CLINICAL DATA:  Acute onset of fever, worsening lethargy and decreased urinary output. Initial encounter. EXAM: PORTABLE CHEST 1 VIEW COMPARISON:  Chest radiograph from 05/25/2015 FINDINGS: The lungs are hypoexpanded. There is no evidence of focal opacification, pleural effusion or pneumothorax. The cardiomediastinal silhouette is within normal limits. No acute osseous abnormalities are seen. IMPRESSION: Lungs hypoexpanded.  Lungs remain grossly clear. Electronically Signed   By: Garald Balding M.D.   On: 02/08/2016 16:53       Assessment and plan per attending neurologist  Etta Quill PA-C [facility] [phone]  02/10/2016, 2:26 PM   Assessment/Plan: 41 year old male presenting with infection. No evidence of recurrent seizures. The EEG is consistent with his known multiple sclerosis and seizure disorder. No signs of active neurological issues at this time. Of note, chronic steroids are not typically shown to improve long-term outcome in multiple sclerosis, and the dose he is on is essentially physiological. I'm not sure if this is really for his MS or if he has adrenal insufficiency precluding lowering the dose.  1) continue Keppra 1500 mg twice a day 2) no further recommendations at this time, neurology will sign off please call with further questions or concerns.  Roland Rack, MD [facility] [phone]  If 7pm-  7am, please page neurology on call as listed in [place].

## 2016-02-10 NOTE — Progress Notes (Signed)
Family Medicine Teaching Service Daily Progress Note Intern Pager: [phone]  Patient name: Terry Palmer Medical record number: [id number] Date of birth: 1975-07-15 Age: 41 y.o. Gender: male  Primary Care Provider: [name], MD Consultants: neurology Code Status: Full  Pt Overview and Major Events to Date:  1/31- admitted to [facility] with emesis, decreased UOP  Assessment and Plan: Terry Palmer is a 41 y.o. male presenting with emesis and decreased urine output. PMH is significant for multiple sclerosis, asthma, neurogenic bladder with chronic suprapubic catheter, contractures.   Possible Sepsis:  qSOFA 1 for altered mental status. 3/4 SIRS (fever, tachycardia and leukocytosis). Possible source of infection is urinary tract. Lactic acid mildly elevated on admission to 2.04. He has leukocytosis with left shift. He is also on chronic steroids for MS. Chest x-ray negative for pneumonia but he could have aspiration pneumonitis which could evolve into aspiration pneumonia. His symptoms and UA are concerning for pyelonephritis. However, urine was obtained from the his chronic supra pubic cath. Flu negative, CT head negative. He has history of seizure, on Keppra. He is also on Wellbutrin which could reduce his seizure threshold. EKG read as atrial fibrillation but notable P waves. It is also unchanged from his old EKG. S/p 3 L normal saline boluses. -Continue vancomycin and Zosyn, cultures approaching 48 hours with growth, can discontinue at that time -urine culture contaminated -leukocytosis improving -Continue D5-1/2 NS@100ml /hr -Nutrition following, appreciate recommendations for tube feeds   Multiple sclerosis with quadriparesis: patient with muscular atrophy and muscular spasticity in his upper and lower extremities. Also with dysphagia (G-tube dependent) and neurogenic bladder (with suprapubic catheter). He is on prednisone 5 mg daily at home. Used to follow [facility] with [facility] but couldn't for the last one year due to transportation.  -Continue home prednisone -Continue home baclofen and tizanidine -Continue home diazepam - PT/OT consulted for wheelchair needs - CM, SW consulted to support family in obtaining resources   Seizure disorder: Patient on Keppra 1500 mg twice a day at home. He is also on Wellbutrin which could reduce his seizures threshold. Unclear if he is postictal from seizure -Continue home Keppra -hold Wellbutrin due to decrease seizure threshold -EEG abnormal, will speak with neurology about this -Seizure precautions  UTI/?Pyelonephritis: UA dirty, urine culture with multiple species present -Strict input and output -Consider renal ultrasound or CT abdomen if no improvement  Neurogenic bladder with suprapubic catheter: Patient on dantrolene 200 milligrams at bedtime. Suprapubic cath changed on 02/03/2016 -Continue home dantrolene -suprapubic catheter changed  Dysphagia/G-tube dependent: On Osmolite 1.5, at 75 m/h from 8 AM to 11 PM at home -IV fluid as above -Nutrition consult for feeds  Asthma: On DuoNeb nebulizer at home. History of cough/? Choking concerning for aspiration pneumonitis/pneumonia. Lung exam with diminished air sounds but no wheezes or crackles anteriorlyChest x-ray negative.  -Continue home DuoNeb -Oxygen as needed, not requiring oxygen at this time  FEN/GI: -Nothing by mouth. Meds via G-tube -Fluid as above  Prophylaxis: -Lovenox  Disposition: pending clinical improvement  Subjective:  Terry Palmer is non-verbal, non-interactive. No family in room.   Objective: Temp:  [98 F (36.7 C)-99.2 F (37.3 C)] 99.2 F (37.3 C) (02/02 0441) Pulse Rate:  [88-97] 94 (02/02 0441) Resp:  [14-18] 14 (02/02 0441) BP: (110-141)/(53-70) 110/53 (02/02 0441) SpO2:  [96 %-98 %] 96 % (02/02 0441) Weight:  [172 lb (78 kg)] 172 lb (78 kg) (02/01 2226) Physical Exam: General: chronically ill  appearing man laying in  bed in NAD Cardiovascular: tachycardic. Regular rhythm. No MRG Respiratory: lungs clear to auscultation in anterior lung fields, no increased work of breathing Abdomen: non-distended, +BS Extremities: contracted extremities, thin with muscle wasting, no edema or cyanosis  Laboratory:  Recent Labs Lab 02/08/16 1609 02/09/16 0223  WBC 33.6* 28.5*  HGB 14.2 12.1*  HCT 42.6 36.6*  PLT 228 202    Recent Labs Lab 02/08/16 1609 02/09/16 0223  NA 133* 139  K 3.7 3.6  CL 96* 105  CO2 25 22  BUN 12 8  CREATININE 0.62 0.62  CALCIUM 9.6 8.7*  PROT 7.8  --   BILITOT 0.6  --   ALKPHOS 87  --   ALT 23  --   AST 22  --   GLUCOSE 86 93   Lactic acid 2.04 >> 3.3 > 1.1  Imaging/Diagnostic Tests: Ct Head Wo Contrast  Result Date: 02/08/2016 CLINICAL DATA:  Altered mental status EXAM: CT HEAD WITHOUT CONTRAST TECHNIQUE: Contiguous axial images were obtained from the base of the skull through the vertex without intravenous contrast. COMPARISON:  05/25/2015 FINDINGS: Brain: Advanced generalized brain atrophy. No sign of acute infarction, mass lesion, hemorrhage, hydrocephalus or extra-axial collection. Vascular: No abnormal vascular finding. Skull: No acute finding. Sinuses/Orbits: Clear/normal Other: None IMPRESSION: Advanced diffuse brain atrophy. Presumably this secondary to chronic multiple sclerosis. No acute or reversible finding. Electronically Signed   By: Nelson Chimes M.D.   On: 02/08/2016 21:04   Dg Chest Port 1 View  Result Date: 02/08/2016 CLINICAL DATA:  Acute onset of fever, worsening lethargy and decreased urinary output. Initial encounter. EXAM: PORTABLE CHEST 1 VIEW COMPARISON:  Chest radiograph from 05/25/2015 FINDINGS: The lungs are hypoexpanded. There is no evidence of focal opacification, pleural effusion or pneumothorax. The cardiomediastinal silhouette is within normal limits. No acute osseous abnormalities are seen. IMPRESSION: Lungs hypoexpanded.   Lungs remain grossly clear. Electronically Signed   By: Garald Balding M.D.   On: 02/08/2016 16:53     Steve Rattler, DO 02/10/2016, 7:32 AM PGY-1, [place] Intern pager: [phone], text pages welcome

## 2016-02-10 NOTE — Care Management Important Message (Signed)
Important Message  Patient Details  Name: Terry Palmer MRN: [id number] Date of Birth: 03-19-1975   Medicare Important Message Given:  Yes    Carles Collet, RN 02/10/2016, 10:36 AM

## 2016-02-10 NOTE — Progress Notes (Signed)
Called and spoke with Dr. Ardelia Mems to discuss contact and droplet precautions after speaking with IP Luther Hearing. MD stated that contact precautions could be discontinued, stated to keep droplet precautions until the team discussed today on rounds. Mckinzie Saksa, [place]

## 2016-02-10 NOTE — Progress Notes (Signed)
PT Evaluation Cancellation Note  Due to pt being a total assist prior to this admission AND the order consulting PT/OT being for a W/C evaluation, Acute PT will sign off in lieu of getting an outpatient consult for W/C evaluation/modification, that will likely be performed at the pt's residence.  Case Management has called [facility]'s w/c team to set up this evaluation once he is discharged.    Thanks for the consult.  We will sign off at this time.  02/10/2016  Donnella Sham, [place] [phone]  (pager)

## 2016-02-10 NOTE — Progress Notes (Signed)
Nutrition Follow-up  DOCUMENTATION CODES:   Not applicable  INTERVENTION:   Initiate TF Osmolite 1.5 @ 25 ml/hr via PEG and increase by 10 ml every 4 hours to a goal rate of 55 ml/hr.   30 mL Prostat once daily per tube.  Tube feeding regimen provides 2,080 calories and 97 grams protein. Free water: 1003 mL   NUTRITION DIAGNOSIS:   Inadequate oral intake related to inability to eat as evidenced by NPO status.  Ongoing  GOAL:   Patient will meet greater than or equal to 90% of their needs  Unmet  MONITOR:   Labs, Weight trends, TF tolerance  REASON FOR ASSESSMENT:   Consult  (Patient on tube feed at home)  ASSESSMENT:   41 y/o male with PMH MS, suprapubic catheter, G-tube, contractures, neurogenic bladder, and asthma admitted with Sepsis. Likely urinary source. Patient unable to provide history. Family not present  Patient's wife was at bedside. Consulted her about tube feeding and she was in agreement with starting TF today. Per wife's report, patient was receiving Osmolite 1.5 @ 66mL/hr for 16 hours via PEG at home PTA. This provides 1680 kcal, 70 grams of protein, and 851 mL of free water. Wife reported that patient was tolerating TF well at home. She agreed to patient being on this same formula at the rate recommendation mentioned above.   RD was paged by doctor to begin tube feed and verbal consent was given.  Diet Order:  Diet NPO time specified  Skin:  Reviewed, no issues  Last BM:  2/1  Height:   Ht Readings from Last 1 Encounters:  02/08/16 6\' 4"  (1.93 m)    Weight:   Wt Readings from Last 1 Encounters:  02/09/16 172 lb (78 kg)    Ideal Body Weight:  78 kg  BMI:  Body mass index is 20.94 kg/m.  Estimated Nutritional Needs:   Kcal:  2000-2200  Protein:  97-107 grams  Fluid:  2-2.2 L/day  EDUCATION NEEDS:   No education needs identified at this time  Juliann Pulse M.S. Nutrition Dietetic Intern

## 2016-02-10 NOTE — Progress Notes (Signed)
OT Screen Note  Patient Details Name: Terry Palmer MRN: [id number] DOB: Oct 31, 1975   Cancelled Treatment:    Reason Eval/Treat Not Completed: OT screened, no needs identified, will sign off. Pt requires total (A) for all adls, hoyer lift to specialized geri chair at baseline. No acute care needs at this time. Recommending HHOT if family concerned with lifts OOB upon d/c otherwise pt is dependent on family for care with aides 12-13 hours per day  Vonita Moss   OTR/L Pager: [phone] Office: [phone] .  02/10/2016, 7:29 AM

## 2016-02-11 DIAGNOSIS — R400 Somnolence: Secondary | ICD-10-CM

## 2016-02-11 LAB — CBC
HCT: 32.2 % — ABNORMAL LOW (ref 39.0–52.0)
Hemoglobin: 9.8 g/dL — ABNORMAL LOW (ref 13.0–17.0)
MCH: 29.9 pg (ref 26.0–34.0)
MCHC: 30.4 g/dL (ref 30.0–36.0)
MCV: 98.2 fL (ref 78.0–100.0)
PLATELETS: 181 10*3/uL (ref 150–400)
RBC: 3.28 MIL/uL — ABNORMAL LOW (ref 4.22–5.81)
RDW: 14.5 % (ref 11.5–15.5)
WBC: 11.8 10*3/uL — AB (ref 4.0–10.5)

## 2016-02-11 LAB — BASIC METABOLIC PANEL
Anion gap: 8 (ref 5–15)
Anion gap: 14 (ref 5–15)
BUN: 10 mg/dL (ref 6–20)
BUN: 13 mg/dL (ref 6–20)
CALCIUM: 9 mg/dL (ref 8.9–10.3)
CHLORIDE: 105 mmol/L (ref 101–111)
CO2: 19 mmol/L — ABNORMAL LOW (ref 22–32)
CO2: 18 mmol/L — ABNORMAL LOW (ref 22–32)
CREATININE: 1.5 mg/dL — AB (ref 0.61–1.24)
Calcium: 6.7 mg/dL — ABNORMAL LOW (ref 8.9–10.3)
Chloride: 114 mmol/L — ABNORMAL HIGH (ref 101–111)
Creatinine, Ser: 1.59 mg/dL — ABNORMAL HIGH (ref 0.61–1.24)
GFR calc Af Amer: >60 mL/min (ref >60)
GFR calc Af Amer: >60 mL/min (ref >60)
GFR calc non Af Amer: 56 mL/min — ABNORMAL LOW (ref >60)
GFR, EST NON AFRICAN AMERICAN: 52 mL/min — AB (ref >60)
GLUCOSE: 140 mg/dL — AB (ref 65–99)
Glucose, Bld: 948 mg/dL (ref 65–99)
Potassium: 2.7 mmol/L — CL (ref 3.5–5.1)
Potassium: 3.9 mmol/L (ref 3.5–5.1)
SODIUM: 132 mmol/L — AB (ref 135–145)
Sodium: 146 mmol/L — ABNORMAL HIGH (ref 135–145)

## 2016-02-11 LAB — GLUCOSE, CAPILLARY
GLUCOSE-CAPILLARY: 173 mg/dL — AB (ref 65–99)
Glucose-Capillary: 178 mg/dL — ABNORMAL HIGH (ref 65–99)
Glucose-Capillary: 159 mg/dL — ABNORMAL HIGH (ref 65–99)
Glucose-Capillary: 143 mg/dL — ABNORMAL HIGH (ref 65–99)
Glucose-Capillary: 156 mg/dL — ABNORMAL HIGH (ref 65–99)
Glucose-Capillary: 146 mg/dL — ABNORMAL HIGH (ref 65–99)

## 2016-02-11 LAB — URINE CULTURE: Culture: NO GROWTH

## 2016-02-11 MED ORDER — FREE WATER
240.0000 mL | Status: DC
  Administered 2016-02-11 – 2016-02-13 (×12): 240 mL

## 2016-02-11 NOTE — Progress Notes (Signed)
Family Medicine Teaching Service Daily Progress Note Intern Pager: [phone]  Patient name: Terry Palmer Medical record number: [id number] Date of birth: August 18, 1975 Age: 41 y.o. Gender: male  Primary Care Provider: [name], MD Consultants: neurology Code Status: Full  Pt Overview and Major Events to Date:  1/31- admitted to [place] with emesis, decreased UOP  Assessment and Plan: Terry Palmer is a 41 y.o. male presenting with emesis and decreased urine output. PMH is significant for multiple sclerosis, asthma, neurogenic bladder with chronic suprapubic catheter, contractures.   Possible Sepsis:  qSOFA 1 for altered mental status. 3/4 SIRS (fever, tachycardia and leukocytosis). Possible source of infection is urinary tract vs aspiration pneumonitis vs flu. Lactic acid mildly elevated on admission to 2.04. He has leukocytosis with left shift. He is also on chronic steroids for MS. Chest x-ray negative for pneumonia but he could have aspiration pneumonitis which could evolve into aspiration pneumonia.  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 Also with dysphagia (G-tube dependent) and neurogenic bladder (with suprapubic catheter). He is on prednisone 5 mg daily at home. Used to follow up with [facility] but couldn't for the last one year due to transportation.  -Continue home prednisone -Continue home baclofen and tizanidine -Continue home diazepam - PT/OT consulted for wheelchair needs, will assess as an outpatient - CM, SW consulted to support family in obtaining resources   Seizure disorder: Patient on Keppra 1500 mg twice a day at home. He is also on Wellbutrin which could reduce his seizures threshold. -neurology consulted,  recommend to continue current regimen -Continue home Keppra -hold Wellbutrin due to decrease seizure threshold -Seizure precautions  Neurogenic bladder with suprapubic catheter: Patient on dantrolene 200 milligrams at bedtime. Suprapubic cath changed on 02/03/2016 -initial UA dirty, culture with multiple species present -repeat UA with leukocytes, bacteria, urine culture pending -Continue home dantrolene -suprapubic catheter changed  Dysphagia/G-tube dependent: On Osmolite 1.5, at 75 m/h from 8 AM to 11 PM at home -Nutrition following for feeds  Asthma: On DuoNeb nebulizer at home. History of cough/? Choking concerning for aspiration pneumonitis/pneumonia. Lung exam with diminished air sounds but no wheezes or crackles anteriorlyChest x-ray negative.  -Continue home DuoNeb -Oxygen as needed, not requiring oxygen at this time  FEN/GI: -Nothing by mouth. Meds via G-tube -Fluid as above  Prophylaxis: -Lovenox  Disposition: home  Subjective:  Terry Palmer is non-verbal, non-interactive. No family in room.   Objective: Temp:  [98.6 F (37 C)-99.4 F (37.4 C)] 98.6 F (37 C) (02/03 0900) Pulse Rate:  [77-96] 77 (02/03 0900) Resp:  [18-20] 18 (02/03 0900) BP: (109-135)/(67-81) 135/81 (02/03 0900) SpO2:  [95 %-100 %] 96 % (02/03 0925) Weight:  [187 lb (84.8 kg)] 187 lb (84.8 kg) (02/02 2100) Physical Exam: General: chronically ill appearing man laying in bed in NAD Cardiovascular: RRR, No MRG Respiratory: lungs clear to auscultation in anterior lung fields, no increased work of breathing Abdomen: non-distended, +BS Extremities: contracted extremities, thin with muscle wasting, no edema or cyanosis  Laboratory:  Recent Labs Lab 02/09/16 0223 02/10/16 0724 02/11/16 0840  WBC 28.5* 16.2* 11.8*  HGB 12.1* 11.8* 9.8*  HCT 36.6* 35.2* 32.2*  PLT 202 166 181    Recent Labs Lab 02/08/16 1609 02/09/16 0223 02/10/16 1019  NA 133* 139 141  K 3.7  3.6 2.8*  CL 96*  105 110  CO2 25 22 22   BUN 12 8 <5*  CREATININE 0.62 0.62 0.78  CALCIUM 9.6 8.7* 8.6*  PROT 7.8  --   --   BILITOT 0.6  --   --   ALKPHOS 87  --   --   ALT 23  --   --   AST 22  --   --   GLUCOSE 86 93 115*   Lactic acid 2.04 >> 3.3 > 1.1  Imaging/Diagnostic Tests: Dg Chest Port 1 View  Result Date: 02/10/2016 CLINICAL DATA:  Infection.  History of asthma. EXAM: PORTABLE CHEST 1 VIEW COMPARISON:  Chest x-rays since March 03, 2015 FINDINGS: The cardiomediastinal silhouette is stable. No pneumothorax. Suggested mild pulmonary venous congestion. No focal infiltrate. Air-filled prominent loops of bowel are seen below both the right and left hemidiaphragm. No other interval changes. IMPRESSION: 1. Mild pulmonary venous congestion. 2. Air-filled mildly prominent loops of bowel under both the right and left hemidiaphragms. Electronically Signed   By: Dorise Bullion III M.D   On: 02/10/2016 14:05     Steve Rattler, DO 02/11/2016, 9:40 AM PGY-1, [place] Intern pager: [phone], text pages welcome

## 2016-02-12 ENCOUNTER — Inpatient Hospital Stay (HOSPITAL_COMMUNITY): Payer: Medicare HMO

## 2016-02-12 LAB — CBC WITH DIFFERENTIAL/PLATELET
BASOS ABS: 0 10*3/uL (ref 0.0–0.1)
Basophils Relative: 0 %
Eosinophils Absolute: 0.2 10*3/uL (ref 0.0–0.7)
Eosinophils Relative: 1 %
HEMATOCRIT: 39.6 % (ref 39.0–52.0)
HEMOGLOBIN: 12.8 g/dL — AB (ref 13.0–17.0)
LYMPHS PCT: 13 %
Lymphs Abs: 2.2 10*3/uL (ref 0.7–4.0)
MCH: 30.8 pg (ref 26.0–34.0)
MCHC: 32.3 g/dL (ref 30.0–36.0)
MCV: 95.2 fL (ref 78.0–100.0)
MONOS PCT: 6 %
Monocytes Absolute: 1 10*3/uL (ref 0.1–1.0)
NEUTROS ABS: 13.9 10*3/uL — AB (ref 1.7–7.7)
Neutrophils Relative %: 80 %
Platelets: 224 10*3/uL (ref 150–400)
RBC: 4.16 MIL/uL — AB (ref 4.22–5.81)
RDW: 14.4 % (ref 11.5–15.5)
WBC: 17.3 10*3/uL — AB (ref 4.0–10.5)

## 2016-02-12 LAB — CBC
HCT: 38.9 % — ABNORMAL LOW (ref 39.0–52.0)
Hemoglobin: 12.7 g/dL — ABNORMAL LOW (ref 13.0–17.0)
MCH: 31 pg (ref 26.0–34.0)
MCHC: 32.6 g/dL (ref 30.0–36.0)
MCV: 94.9 fL (ref 78.0–100.0)
PLATELETS: 230 10*3/uL (ref 150–400)
RBC: 4.1 MIL/uL — AB (ref 4.22–5.81)
RDW: 14.4 % (ref 11.5–15.5)
WBC: 16.7 10*3/uL — ABNORMAL HIGH (ref 4.0–10.5)

## 2016-02-12 LAB — RENAL FUNCTION PANEL
ALBUMIN: 3.2 g/dL — AB (ref 3.5–5.0)
ANION GAP: 9 (ref 5–15)
BUN: 21 mg/dL — ABNORMAL HIGH (ref 6–20)
CO2: 25 mmol/L (ref 22–32)
Calcium: 9.3 mg/dL (ref 8.9–10.3)
Chloride: 110 mmol/L (ref 101–111)
Creatinine, Ser: 1.6 mg/dL — ABNORMAL HIGH (ref 0.61–1.24)
GFR, EST AFRICAN AMERICAN: 60 mL/min — AB (ref >60)
GFR, EST NON AFRICAN AMERICAN: 52 mL/min — AB (ref >60)
Glucose, Bld: 139 mg/dL — ABNORMAL HIGH (ref 65–99)
PHOSPHORUS: 2.3 mg/dL — AB (ref 2.5–4.6)
POTASSIUM: 4.1 mmol/L (ref 3.5–5.1)
Sodium: 144 mmol/L (ref 135–145)

## 2016-02-12 LAB — GLUCOSE, CAPILLARY
GLUCOSE-CAPILLARY: 164 mg/dL — AB (ref 65–99)
GLUCOSE-CAPILLARY: 153 mg/dL — AB (ref 65–99)
Glucose-Capillary: 125 mg/dL — ABNORMAL HIGH (ref 65–99)
Glucose-Capillary: 153 mg/dL — ABNORMAL HIGH (ref 65–99)
Glucose-Capillary: 153 mg/dL — ABNORMAL HIGH (ref 65–99)
Glucose-Capillary: 155 mg/dL — ABNORMAL HIGH (ref 65–99)

## 2016-02-12 LAB — BASIC METABOLIC PANEL
Anion gap: 10 (ref 5–15)
BUN: 20 mg/dL (ref 6–20)
CO2: 25 mmol/L (ref 22–32)
CREATININE: 1.63 mg/dL — AB (ref 0.61–1.24)
Calcium: 9.5 mg/dL (ref 8.9–10.3)
Chloride: 110 mmol/L (ref 101–111)
GFR calc non Af Amer: 50 mL/min — ABNORMAL LOW (ref >60)
GFR, EST AFRICAN AMERICAN: 59 mL/min — AB (ref >60)
GLUCOSE: 153 mg/dL — AB (ref 65–99)
Potassium: 4 mmol/L (ref 3.5–5.1)
Sodium: 145 mmol/L (ref 135–145)

## 2016-02-12 MED ORDER — SODIUM CHLORIDE 0.9 % IV BOLUS (SEPSIS)
1000.0000 mL | Freq: Once | INTRAVENOUS | Status: AC
  Administered 2016-02-12: 1000 mL via INTRAVENOUS

## 2016-02-12 MED ORDER — CEFUROXIME AXETIL 250 MG/5ML PO SUSR
250.0000 mg | Freq: Two times a day (BID) | ORAL | Status: DC
  Administered 2016-02-12 – 2016-02-13 (×2): 250 mg
  Filled 2016-02-12 (×2): qty 5

## 2016-02-12 NOTE — Progress Notes (Signed)
Family Medicine Teaching Service Daily Progress Note Intern Pager: [phone]  Patient name: Terry Palmer Medical record number: [id number] Date of birth: 03-09-1975 Age: 41 y.o. Gender: male  Primary Care Provider: [name], MD Consultants: neurology Code Status: Full  Pt Overview and Major Events to Date:  1/31- admitted to [place] with emesis, decreased UOP  Assessment and Plan: Terry Palmer is a 41 y.o. male presenting with emesis and decreased urine output. PMH is significant for multiple sclerosis, asthma, neurogenic bladder with chronic suprapubic catheter, contractures.   Possible Sepsis, Resolved:  qSOFA 1 for altered mental status. 3/4 SIRS (fever, tachycardia and leukocytosis). Possible source of infection is urinary tract vs aspiration pneumonitis vs flu. Lactic acid mildly elevated on admission to 2.04. He has leukocytosis with left shift. He is also on chronic steroids for MS. Chest x-ray negative for pneumonia but he could have aspiration pneumonitis which could evolve into aspiration pneumonia.  -stopped antibiotics 2/2 at 5pm, patient afebrile overnight with other VS normal -Blood and urine cultures no growth to date -continue Tamiflu (day 3 of 5) (flu negative but clinically treating) -repeat labs this evening - appears hemoconcentrated   AKI: Cr baseline .60. Has had gradual increase over the last 3 days. Unsure what is precipitating worsening. Possible antibiotic induced nephrotoxicity from vanc/zoysn. However this was discontinued 2 days ago. Most likely secondary to dehydration as his labs appear to be hemoconcentrated. -monitor -repeat labs this evening -will give 1L fluid bolus -Renal US ordered to look for any obstruction  Multiple sclerosis with quadriparesis: patient with muscular atrophy and muscular spasticity in his upper and lower extremities. Also with dysphagia (G-tube dependent) and neurogenic bladder (with suprapubic catheter). He is on  prednisone 5 mg daily at home. Used to follow up with [facility] but couldn't for the last one year due to transportation.  -Continue home prednisone -Continue home baclofen and tizanidine -Continue home diazepam - PT/OT consulted for wheelchair needs, will assess as an outpatient - CM, SW consulted to support family in obtaining resources   Seizure disorder: Patient on Keppra 1500 mg twice a day at home. He is also on Wellbutrin which could reduce his seizures threshold. -neurology consulted, recommend to continue current regimen - signed off  -Continue home Keppra -hold Wellbutrin due to decrease seizure threshold -Seizure precautions  Neurogenic bladder with suprapubic catheter: Patient on dantrolene 200 milligrams at bedtime. Suprapubic cath changed on 02/03/2016 -initial UA dirty, culture with multiple species present -repeat UA with leukocytes, bacteria, urine culture no growth x2 days -Continue home dantrolene -suprapubic catheter changed  Dysphagia/G-tube dependent: On Osmolite 1.5, at 75 m/h from 8 AM to 11 PM at home -Nutrition following for feeds -Nothing by mouth  Asthma: On DuoNeb nebulizer at home. History of cough/? Choking concerning for aspiration pneumonitis/pneumonia (h/o dysphagia). Lung exam with diminished air sounds but no wheezes or crackles anteriorly. Chest x-ray negative.  -Continue home DuoNeb -Oxygen as needed, not requiring oxygen at this time  FEN/GI: -Nothing by mouth. Meds and feeds via G-tube -Fluid as above  Prophylaxis: -Lovenox  Disposition: pending improvement in renal function  Subjective:  Terry Palmer is non-verbal, non-interactive. No family in room. No reported overnight events.   Objective: Temp:  [97.5 F (36.4 C)-98.6 F (37 C)] 98.5 F (36.9 C) (02/04 0500) Pulse Rate:  [77-94] 89 (02/04 0500) Resp:  [16-19] 16 (02/04 0500) BP: (120-138)/(76-83) 138/77 (02/04 0500) SpO2:  [96 %-100 %] 98 % (02/04  0841) Weight:  [  177 lb 14.6 oz (80.7 kg)-178 lb (80.7 kg)] 177 lb 14.6 oz (80.7 kg) (02/04 0500) Physical Exam: General: chronically ill appearing man laying in bed in NAD, non-verbal Cardiovascular: RRR, No MRG Respiratory: lungs clear to auscultation in anterior lung fields, no increased work of breathing Abdomen: non-distended, +BS Extremities: contracted extremities, thin with muscle wasting, no edema or cyanosis  Laboratory:  Recent Labs Lab 02/10/16 0724 02/11/16 0840 02/12/16 0455  WBC 16.2* 11.8* 16.7*  HGB 11.8* 9.8* 12.7*  HCT 35.2* 32.2* 38.9*  PLT 166 181 230    Recent Labs Lab 02/08/16 1609  02/11/16 0840 02/11/16 1144 02/12/16 0455  NA 133*  < > 132* 146* 145  K 3.7  < > 2.7* 3.9 4.0  CL 96*  < > 105 114* 110  CO2 25  < > 19* 18* 25  BUN 12  < > 10 13 20   CREATININE 0.62  < > 1.50* 1.59* 1.63*  CALCIUM 9.6  < > 6.7* 9.0 9.5  PROT 7.8  --   --   --   --   BILITOT 0.6  --   --   --   --   ALKPHOS 87  --   --   --   --   ALT 23  --   --   --   --   AST 22  --   --   --   --   GLUCOSE 86  < > 948* 140* 153*  < > = values in this interval not displayed. Lactic acid 2.04 >> 3.3 > 1.1  Imaging/Diagnostic Tests: Dg Chest Port 1 View  Result Date: 02/10/2016 CLINICAL DATA:  Infection.  History of asthma. EXAM: PORTABLE CHEST 1 VIEW COMPARISON:  Chest x-rays since March 03, 2015 FINDINGS: The cardiomediastinal silhouette is stable. No pneumothorax. Suggested mild pulmonary venous congestion. No focal infiltrate. Air-filled prominent loops of bowel are seen below both the right and left hemidiaphragm. No other interval changes. IMPRESSION: 1. Mild pulmonary venous congestion. 2. Air-filled mildly prominent loops of bowel under both the right and left hemidiaphragms. Electronically Signed   By: Dorise Bullion III M.D   On: 02/10/2016 14:05    Katheren Shams, DO 02/12/2016, 8:55 AM PGY-3, [place] Intern pager: [phone], text pages  welcome

## 2016-02-13 LAB — CULTURE, BLOOD (ROUTINE X 2)
Culture: NO GROWTH
Culture: NO GROWTH

## 2016-02-13 LAB — RENAL FUNCTION PANEL
ALBUMIN: 3.2 g/dL — AB (ref 3.5–5.0)
Anion gap: 7 (ref 5–15)
BUN: 25 mg/dL — AB (ref 6–20)
CALCIUM: 9.5 mg/dL (ref 8.9–10.3)
CO2: 24 mmol/L (ref 22–32)
CREATININE: 1.54 mg/dL — AB (ref 0.61–1.24)
Chloride: 111 mmol/L (ref 101–111)
GFR calc Af Amer: >60 mL/min (ref >60)
GFR, EST NON AFRICAN AMERICAN: 54 mL/min — AB (ref >60)
GLUCOSE: 159 mg/dL — AB (ref 65–99)
PHOSPHORUS: 2.6 mg/dL (ref 2.5–4.6)
POTASSIUM: 3.4 mmol/L — AB (ref 3.5–5.1)
SODIUM: 142 mmol/L (ref 135–145)

## 2016-02-13 LAB — GLUCOSE, CAPILLARY
GLUCOSE-CAPILLARY: 147 mg/dL — AB (ref 65–99)
GLUCOSE-CAPILLARY: 149 mg/dL — AB (ref 65–99)
GLUCOSE-CAPILLARY: 151 mg/dL — AB (ref 65–99)
Glucose-Capillary: 167 mg/dL — ABNORMAL HIGH (ref 65–99)

## 2016-02-13 MED ORDER — OSELTAMIVIR PHOSPHATE 75 MG PO CAPS: 75.0000 mg | ORAL_CAPSULE | Freq: Two times a day (BID) | ORAL | 0 refills | Status: AC

## 2016-02-13 NOTE — Progress Notes (Signed)
Family Medicine Teaching Service Daily Progress Note Intern Pager: [phone]  Patient name: Terry Palmer Medical record number: [id number] Date of birth: May 13, 1975 Age: 41 y.o. Gender: male  Primary Care Provider: [name], MD Consultants: neurology Code Status: Full  Pt Overview and Major Events to Date:  1/31- admitted to [place] with emesis, decreased UOP  Assessment and Plan: Terry Palmer is a 41 y.o. male presenting with emesis and decreased urine output. PMH is significant for multiple sclerosis, asthma, neurogenic bladder with chronic suprapubic catheter, contractures.   Possible Sepsis, Resolved:  Possible source of infection is urinary tract vs aspiration pneumonitis vs flu. Lactic acid mildly elevated on admission to 2.04. He has leukocytosis with left shift. He is also on chronic steroids for MS. Chest x-ray negative for pneumonia but he could have aspiration pneumonitis which could evolve into aspiration pneumonia.  -stopped antibiotics 2/2 at 5pm, patient afebrile since with other vital signs stable as well -Blood and urine cultures no growth to date -continue Tamiflu (day 4 of 5) (flu negative but clinically treating)  AKI: Cr baseline 0.60. Has had gradual increase over hospitalization. Unsure what is precipitating worsening. Possible antibiotic induced nephrotoxicity from vanc/zoysn. Possibly due to dehydration. S/p 1 L bolus 2/4 -Renal US normal -Creatinine mildly improved to 1.54 today -continue free water flushes per tube q4H  Multiple sclerosis with quadriparesis: patient with muscular atrophy and muscular spasticity in his upper and lower extremities. Also with dysphagia (G-tube dependent) and neurogenic bladder (with suprapubic catheter). He is on prednisone 5 mg daily at home. Used to follow up with [facility] but couldn't for the last one year due to transportation.  -Continue home prednisone -Continue home baclofen and  tizanidine -Continue home diazepam  Seizure disorder: Patient on Keppra 1500 mg twice a day at home. He is also on Wellbutrin which could reduce his seizures threshold. -Continue home Keppra -hold Wellbutrin due to decrease seizure threshold -Seizure precautions  Neurogenic bladder with suprapubic catheter: Patient on dantrolene 200 milligrams at bedtime. Suprapubic cath changed on 02/03/2016 -Continue home dantrolene -suprapubic catheter changed  Dysphagia/G-tube dependent: On Osmolite 1.5, at 75 m/h from 8 AM to 11 PM at home -Nutrition following for tube feeds -Nothing by mouth  Asthma: On DuoNeb nebulizer at home. History of cough/? Choking concerning for aspiration pneumonitis/pneumonia (h/o dysphagia). Lung exam with diminished air sounds but no wheezes or crackles anteriorly. Chest x-ray negative.  -Continue home DuoNeb -Oxygen as needed, not requiring oxygen at this time  FEN/GI: -Nothing by mouth. Meds and feeds via G-tube -Fluid as above  Prophylaxis: -Lovenox  Disposition: pending improvement in renal function  Subjective:  Terry Palmer is non-verbal, non-interactive. No family in room. No reported overnight events.    Objective: Temp:  [98.4 F (36.9 C)-98.6 F (37 C)] 98.6 F (37 C) (02/05 0442) Pulse Rate:  [82-91] 82 (02/05 0442) Resp:  [17-19] 17 (02/05 0442) BP: (129-136)/(80-82) 134/81 (02/05 0442) SpO2:  [100 %] 100 % (02/05 0442) Weight:  [177 lb 6.4 oz (80.5 kg)] 177 lb 6.4 oz (80.5 kg) (02/04 2135) Physical Exam: General: chronically ill appearing man laying in bed in NAD, non-verbal Cardiovascular: RRR, No MRG Respiratory: lungs clear to auscultation in anterior lung fields, no increased work of breathing Abdomen: non-distended, +BS Extremities: contracted extremities, thin with muscle wasting, no edema or cyanosis  Laboratory:  Recent Labs Lab 02/11/16 0840 02/12/16 0455 02/12/16 1749  WBC 11.8* 16.7* 17.3*  HGB 9.8* 12.7* 12.8*   HCT  32.2* 38.9* 39.6  PLT 181 230 224    Recent Labs Lab 02/08/16 1609  02/12/16 0455 02/12/16 1749 02/13/16 0624  NA 133*  < > 145 144 142  K 3.7  < > 4.0 4.1 3.4*  CL 96*  < > 110 110 111  CO2 25  < > 25 25 24   BUN 12  < > 20 21* 25*  CREATININE 0.62  < > 1.63* 1.60* 1.54*  CALCIUM 9.6  < > 9.5 9.3 9.5  PROT 7.8  --   --   --   --   BILITOT 0.6  --   --   --   --   ALKPHOS 87  --   --   --   --   ALT 23  --   --   --   --   AST 22  --   --   --   --   GLUCOSE 86  < > 153* 139* 159*  < > = values in this interval not displayed. Lactic acid 2.04 >> 3.3 > 1.1  Imaging/Diagnostic Tests: US Renal  Result Date: 02/12/2016 CLINICAL DATA:  Acute kidney injury.  Neurogenic bladder. EXAM: RENAL / URINARY TRACT ULTRASOUND COMPLETE COMPARISON:  03/03/2015 FINDINGS: Right Kidney: Length: 12 cm. Echogenic appearance of the cortex which suggests medical renal disease. No mass or hydronephrosis visualized. No atrophy. Left Kidney: Length: 12 cm. Echogenic cortex, symmetric. No mass or hydronephrosis visualized. No atrophy. Bladder: Not visualized. Patient has a suprapubic tube based on abdominal CT from last year. IMPRESSION: No hydronephrosis or collection. Bladder not visualized. Electronically Signed   By: Monte Fantasia M.D.   On: 02/12/2016 13:54    Steve Rattler, DO 02/13/2016, 9:45 AM PGY-1 [place] Intern pager: [phone], text pages welcome

## 2016-02-13 NOTE — Care Management Important Message (Signed)
Important Message  Patient Details  Name: JABAR TREDINNICK MRN: [id number] Date of Birth: 1975/01/23   Medicare Important Message Given:  Yes    Sharin Mons, RN 02/13/2016, 11:09 AM

## 2016-02-13 NOTE — Discharge Summary (Signed)
[facility] Discharge Summary  Patient name: Terry Palmer Medical record number: [id number] Date of birth: 09-May-1975 Age: 41 y.o. Gender: male Date of Admission: 02/08/2016  Date of Discharge: 02/13/16  Admitting Physician: Lupita Dawn, MD  Primary Care Provider: [name], MD Consultants: neurology, nutrition, PT, OT, CSW, CM  Indication for Hospitalization: emesis, decreased UOP  Discharge Diagnoses/Problem List:  Patient Active Problem List   Diagnosis Date Noted  . Acute cystitis with hematuria   . Suprapubic catheter ([place])   . G tube feedings ([place])   . Flexion contractures   . Encephalopathy   . Bacteremia 05/27/2015  . Sepsis ([place]) 05/25/2015  . Altered mental state 05/25/2015  . Altered mental status 05/25/2015  . Fever, unspecified 03/04/2015  . Quadriplegia and quadriparesis ([place]) 12/24/2014  . 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 Protein-calorie malnutrition, severe ([place]) 10/21/2014  . Sepsis secondary to UTI ([place]) 10/20/2014  . Leukocytosis 10/20/2014  . Acute respiratory failure with hypoxia ([place]) 10/20/2014  . Seizure disorder ([place]) 10/20/2014  . Aspiration pneumonitis ([place]) 10/20/2014  . Neurogenic bladder 05/28/2011  . Multiple sclerosis ([place]) 04/20/2011  . FTT (failure to thrive) in adult 04/20/2011  . Sacral decubitus ulcer, stage II 04/20/2011  . Quadriparesis (muscle weakness) ([place]) 03/12/2011    Disposition: home  Discharge Condition: stable, improved   Discharge Exam: see progress note from day of discharge  Brief Hospital Course:  Terry Palmer a 41 y.o.malewho presented with emesis and decreased urine output. PMH is significant for multiple sclerosis, asthma, neurogenic bladder with chronic suprapubic catheter, contractures. He was admitted to [facility] due to concerns for sepsis. Patient initially met sepsis criteria with leukocytosis, fever,  and tachycardia. Source was thought to be urinary vs. Pneumonitis. His suprapubic catheter was changed. Urine culture grew multiple species. CXR was clear. He was started on vancomycin and zosyn empirically. He tested negative for the flu but clinically there was concern for influenza infection. He was treated with Tamiflu to complete a 5 day course. Family declined a palliative care consult, however they raised concerns that patient needs a wheelchair at home for transportation issues. They are unable to get him onto the bus to take him to doctor's visits. PT and OT were consulted and will assess him as outpatient for those needs. Patient clinically improved to his baseline and antibiotics were stopped. He was monitored overnight for return of fever. He remained afebrile with stable vital signs but there was an increase in his creatinine from 0.7 to 1.6. He was given more fluids. Renal U/S was negative. The cause for AKI was thought to be due to Vancomycin administration. At time of discharge creatinine was trending down but had not returned to baseline. Patient's urine output was normal. He was discharged home with resumption of his home health services.   Issues for Follow Up:  1. Please recheck creatinine level in a week to ensure it is returning to normal 2. Continue home health RN and PT that patient has been receiving 3. Patient has need for wheelchair at home and needs outpatient PT evaluation   Significant Procedures: none  Significant Labs and Imaging:   Recent Labs Lab 02/11/16 0840 02/12/16 0455 02/12/16 1749  WBC 11.8* 16.7* 17.3*  HGB 9.8* 12.7* 12.8*  HCT 32.2* 38.9* 39.6  PLT 181 230 224    Recent Labs Lab 02/08/16 1609 02/08/16 2232  02/11/16 0840 02/11/16 1144 02/12/16 0455 02/12/16  1749 02/13/16 0624  NA 133*  --   < > 132* 146* 145 144 142  K 3.7  --   < > 2.7* 3.9 4.0 4.1 3.4*  CL 96*  --   < > 105 114* 110 110 111  CO2 25  --   < > 19* 18* 25 25 24   GLUCOSE 86   --   < > 948* 140* 153* 139* 159*  BUN 12  --   < > 10 13 20  21* 25*  CREATININE 0.62  --   < > 1.50* 1.59* 1.63* 1.60* 1.54*  CALCIUM 9.6  --   < > 6.7* 9.0 9.5 9.3 9.5  MG  --  2.2  --   --   --   --   --   --   PHOS  --  2.6  --   --   --   --  2.3* 2.6  ALKPHOS 87  --   --   --   --   --   --   --   AST 22  --   --   --   --   --   --   --   ALT 23  --   --   --   --   --   --   --   ALBUMIN 4.1  --   --   --   --   --  3.2* 3.2*  < > = values in this interval not displayed.  Flu- negative  Results/Tests Pending at Time of Discharge: none  Discharge Medications:  Allergies as of 02/13/2016   No Known Allergies     Medication List    TAKE these medications   acetaminophen 500 MG tablet Commonly known as:  TYLENOL Take 1 tablet (500 mg total) by mouth every 8 (eight) hours as needed for fever (or pain). What changed:  how much to take  when to take this   ACIDOPHILUS PO Place 1 capsule into feeding tube daily.   baclofen 20 MG tablet Commonly known as:  LIORESAL Place 1 tablet (20 mg total) into feeding tube 4 (four) times daily.   bisacodyl 10 MG suppository Commonly known as:  DULCOLAX Place 10 mg rectally once as needed for moderate constipation.   buPROPion 75 MG tablet Commonly known as:  WELLBUTRIN Place 1 tablet (75 mg total) into feeding tube 3 (three) times daily.   cholecalciferol 1000 units tablet Commonly known as:  VITAMIN D Take 1 tablet (1,000 Units total) by mouth 2 (two) times daily. What changed:  how much to take  how to take this   Cranberry Juice Powder 425 MG Caps 1 capsule (425 mg total) by PEG Tube route 2 (two) times daily.   dantrolene 50 MG capsule Commonly known as:  DANTRIUM Give 200 mg by tube at bedtime.   diazepam 5 MG tablet Commonly known as:  VALIUM Place 5 mg into feeding tube at bedtime as needed for anxiety (sleep).   feeding supplement (OSMOLITE 1.5 CAL) Liqd Place 80 mL/hr into feeding tube See admin  instructions. For 14 hours a day What changed:  how much to take  additional instructions   fexofenadine 180 MG tablet Commonly known as:  ALLEGRA Place 180 mg into feeding tube daily.   free water Soln Place 240 mLs into feeding tube every 4 (four) hours.   Garlic Oil 4235 MG Caps Give 1,000 mg by tube daily with breakfast.  HYDROcodone-acetaminophen 7.5-325 MG tablet Commonly known as:  [place]  1 tablet into feeding tube daily as needed (pain).   ipratropium-albuterol 0.5-2.5 (3) MG/3ML Soln Commonly known as:  DUONEB Take 3 mLs by nebulization 2 (two) times daily.   levETIRAcetam 500 MG tablet Commonly known as:  KEPPRA Take 3 tablets (1,500 mg total) by mouth 2 (two) times daily.   multivitamin with minerals Tabs tablet Place 1 tablet into feeding tube daily.   NALTREXONE HCL PO Give 4.5 mg by tube at bedtime.   neomycin-bacitracin-polymyxin [phone] ointment Apply 1 application topically 4 (four) times daily as needed (abraisions, irritations).   nystatin powder Generic drug:  nystatin Apply 1 g topically 3 (three) times daily. What changed:  when to take this  additional instructions   omeprazole 2 mg/mL Susp Commonly known as:  PRILOSEC Place 20 mLs (40 mg total) into feeding tube daily.   oseltamivir 75 MG capsule Commonly known as:  TAMIFLU Place 1 capsule (75 mg total) into feeding tube 2 (two) times daily.   polyethylene glycol packet Commonly known as:  MIRALAX / GLYCOLAX Place 17 g into feeding tube daily. What changed:  when to take this   polyvinyl alcohol 1.4 % ophthalmic solution Commonly known as:  LIQUIFILM TEARS Place 1 drop into both eyes 3 (three) times daily. What changed:  when to take this   predniSONE 5 MG/ML concentrated solution Place 2 mLs (10 mg total) into feeding tube daily with breakfast. What changed:  how much to take   senna 8.6 MG Tabs tablet Commonly known as:  SENOKOT Take 2 tablets by mouth at  bedtime.   SIMPLY SALINE 0.9 % Aers Generic drug:  Saline Place 1 spray into both nostrils as needed (for nasal conjestion).   sodium phosphate enema Place 1 enema rectally once as needed (for constipation). follow package directions   tiZANidine 2 MG tablet Commonly known as:  ZANAFLEX Take 1 tablet (2 mg total) by mouth every 6 (six) hours as needed for muscle spasms.   vitamin C 500 MG tablet Commonly known as:  ASCORBIC ACID Place 500 mg into feeding tube 2 (two) times daily.            Durable Medical Equipment        Start     Ordered   02/09/16 1556  For home use only DME Other see comment  Once    Comments:  See for power wheelchair   02/09/16 1556      Discharge Instructions: Please refer to Patient Instructions section of EMR for full details.  Patient was counseled important signs and symptoms that should prompt return to medical care, changes in medications, dietary instructions, activity restrictions, and follow up appointments.   Follow-Up Appointments: Follow-up [place] Follow up.   Why:  Rehab Team will call after discharge to set up home visit to measure patient for specialized wheelchair. Contact information: [address] [address] [place], DO 02/13/2016, 1:59 PM PGY-1, [facility]

## 2016-02-13 NOTE — Discharge Instructions (Signed)
°  Please have Terry Palmer's kidney function rechecked in about a week to ensure his Creatinine is returning to normal.

## 2016-02-13 NOTE — Care Management Note (Signed)
Case Management Note  Patient Details  Name: Terry Palmer MRN: [id number] Date of Birth: Nov 05, 1975  Subjective/Objective:                    Action/Plan: Plan is to d/c to home today with home health services( RN) . PTAR [phone]) called per CM for transportation to home.  Expected Discharge Date:  02/13/16               Expected Discharge Plan:     In-House Referral:     Discharge planning Services  CM Consult  Post Acute Care Choice:  Durable Medical Equipment, Resumption of Svcs/PTA Provider Choice offered to:  Spouse  DME Arranged:  Media planner DME Agency:  [place]:  RN [place] Agency:   ([facility])  Status of Service:  Completed, signed off  If discussed at [place] of Stay Meetings, dates discussed:    Additional Comments:  Sharin Mons, RN 02/13/2016, 2:22 PM

## 2016-04-05 ENCOUNTER — Telehealth: Payer: Self-pay | Admitting: Neurology

## 2016-04-05 NOTE — Telephone Encounter (Signed)
Pt mother calling asking that Dr Jannifer [name] calls her back re: her son's M.S. I asked if she needed to schedule an appointment and she stated there are difficulties re: transportation for her son.

## 2016-04-05 NOTE — Telephone Encounter (Signed)
I called the mother again, left a message, I will call back tomorrow.

## 2016-04-05 NOTE — Telephone Encounter (Signed)
I called the mother, left a message, I'll call back later.

## 2016-04-06 NOTE — Telephone Encounter (Signed)
I called the mother, once again left a message, if she needs anything, she is to call and leave [place] a number where she can be reached.  This patient does have end-stage multiple sclerosis, end-stage dementia, no need for revisits. We did give the patient baclofen and tizanidine, okay to continue these prescriptions.

## 2016-04-06 NOTE — Telephone Encounter (Signed)
I called and finally was able to contact the patient's mother.  [name] had some issues with intermittent spasms. The patient is on baclofen, diazepam, and tizanidine. The tizanidine is written as needed, the mother will find out if he is getting this medication on a scheduled basis, if not he will go on 2 mg every 6 hours. If he is already getting this dose, I will increase the medication to the 4 mg tablet.  The mother will call back and let me know.

## 2016-04-06 NOTE — Telephone Encounter (Signed)
Patient called office returning Dr. Willis' call.  Please call °

## 2016-04-12 NOTE — Telephone Encounter (Signed)
Pt wife is calling re: how exactly Dr Jannifer [name] wants pt to take this medication (tiZANidine (ZANAFLEX) 2 MG tablet), pt mother was not for certain so wife is now calling

## 2016-04-12 NOTE — Telephone Encounter (Signed)
This patient is on baclofen and Dantrium and takes tizanidine on a when necessary basis, but the spasms are still quite severe. Okay to start the tizanidine 2 mg every 6 hours on a scheduled basis, if this is not enough, the wife will call us back.

## 2016-04-13 NOTE — Telephone Encounter (Signed)
Patients wife [name] called office would like to know if its okay to give Baclofen and Tizanidine at the same time.  Also would like to know if we are able to fax orders of the medication instructions/change to [facility] (home health patient uses at home) to [phone].  Please call

## 2016-04-13 NOTE — Telephone Encounter (Signed)
I called the wife, it is okay to use tizanidine and baclofen together at the same time.

## 2016-04-27 ENCOUNTER — Emergency Department (HOSPITAL_COMMUNITY): Payer: Medicare HMO

## 2016-04-27 ENCOUNTER — Inpatient Hospital Stay (HOSPITAL_COMMUNITY)
Admission: EM | Admit: 2016-04-27 | Discharge: 2016-05-02 | DRG: 698 | Disposition: A | Discharge status: Home / Self Care | Payer: Medicare HMO | Attending: Internal Medicine | Admitting: Internal Medicine

## 2016-04-27 ENCOUNTER — Encounter (HOSPITAL_COMMUNITY): Payer: Self-pay

## 2016-04-27 DIAGNOSIS — IMO0001 Reserved for inherently not codable concepts without codable children: Secondary | ICD-10-CM | POA: Diagnosis present

## 2016-04-27 DIAGNOSIS — R509 Fever, unspecified: Secondary | ICD-10-CM

## 2016-04-27 DIAGNOSIS — Z87891 Personal history of nicotine dependence: Secondary | ICD-10-CM | POA: Diagnosis not present

## 2016-04-27 DIAGNOSIS — Z825 Family history of asthma and other chronic lower respiratory diseases: Secondary | ICD-10-CM | POA: Diagnosis not present

## 2016-04-27 DIAGNOSIS — Z931 Gastrostomy status: Secondary | ICD-10-CM | POA: Diagnosis not present

## 2016-04-27 DIAGNOSIS — T83511A Infection and inflammatory reaction due to indwelling urethral catheter, initial encounter: Principal | ICD-10-CM | POA: Diagnosis present

## 2016-04-27 DIAGNOSIS — J189 Pneumonia, unspecified organism: Secondary | ICD-10-CM | POA: Diagnosis present

## 2016-04-27 DIAGNOSIS — E872 Acidosis, unspecified: Secondary | ICD-10-CM | POA: Diagnosis present

## 2016-04-27 DIAGNOSIS — Z87440 Personal history of urinary (tract) infections: Secondary | ICD-10-CM

## 2016-04-27 DIAGNOSIS — T380X5A Adverse effect of glucocorticoids and synthetic analogues, initial encounter: Secondary | ICD-10-CM | POA: Diagnosis present

## 2016-04-27 DIAGNOSIS — L89152 Pressure ulcer of sacral region, stage 2: Secondary | ICD-10-CM | POA: Diagnosis present

## 2016-04-27 DIAGNOSIS — N3001 Acute cystitis with hematuria: Secondary | ICD-10-CM | POA: Diagnosis present

## 2016-04-27 DIAGNOSIS — F0280 Dementia in other diseases classified elsewhere without behavioral disturbance: Secondary | ICD-10-CM | POA: Diagnosis present

## 2016-04-27 DIAGNOSIS — G35 Multiple sclerosis: Secondary | ICD-10-CM | POA: Diagnosis present

## 2016-04-27 DIAGNOSIS — Y846 Urinary catheterization as the cause of abnormal reaction of the patient, or of later complication, without mention of misadventure at the time of the procedure: Secondary | ICD-10-CM | POA: Diagnosis present

## 2016-04-27 DIAGNOSIS — A419 Sepsis, unspecified organism: Secondary | ICD-10-CM | POA: Diagnosis present

## 2016-04-27 DIAGNOSIS — E2740 Unspecified adrenocortical insufficiency: Secondary | ICD-10-CM | POA: Diagnosis present

## 2016-04-27 DIAGNOSIS — N319 Neuromuscular dysfunction of bladder, unspecified: Secondary | ICD-10-CM | POA: Diagnosis present

## 2016-04-27 DIAGNOSIS — G8250 Quadriplegia, unspecified: Secondary | ICD-10-CM | POA: Diagnosis present

## 2016-04-27 DIAGNOSIS — R410 Disorientation, unspecified: Secondary | ICD-10-CM | POA: Diagnosis not present

## 2016-04-27 LAB — URINALYSIS, ROUTINE W REFLEX MICROSCOPIC
Bilirubin Urine: NEGATIVE
GLUCOSE, UA: NEGATIVE mg/dL
Ketones, ur: NEGATIVE mg/dL
Nitrite: POSITIVE — AB
PROTEIN: 100 mg/dL — AB

## 2016-04-27 LAB — URINALYSIS, MICROSCOPIC (REFLEX)

## 2016-04-27 LAB — I-STAT CG4 LACTIC ACID, ED
LACTIC ACID, VENOUS: 4.7 mmol/L — AB (ref 0.5–1.9)
LACTIC ACID, VENOUS: 2.92 mmol/L — AB (ref 0.5–1.9)

## 2016-04-27 LAB — COMPREHENSIVE METABOLIC PANEL
ALBUMIN: 4.5 g/dL (ref 3.5–5.0)
ALT: 22 U/L (ref 17–63)
AST: 29 U/L (ref 15–41)
Alkaline Phosphatase: 89 U/L (ref 38–126)
Anion gap: 18 — ABNORMAL HIGH (ref 5–15)
BILIRUBIN TOTAL: 0.7 mg/dL (ref 0.3–1.2)
BUN: 15 mg/dL (ref 6–20)
CHLORIDE: 100 mmol/L — AB (ref 101–111)
CO2: 22 mmol/L (ref 22–32)
CREATININE: 0.79 mg/dL (ref 0.61–1.24)
Calcium: 9.8 mg/dL (ref 8.9–10.3)
GFR calc Af Amer: >60 mL/min (ref >60)
Glucose, Bld: 118 mg/dL — ABNORMAL HIGH (ref 65–99)
POTASSIUM: 4.3 mmol/L (ref 3.5–5.1)
Sodium: 140 mmol/L (ref 135–145)
Total Protein: 8 g/dL (ref 6.5–8.1)

## 2016-04-27 LAB — CBC WITH DIFFERENTIAL/PLATELET
Basophils Absolute: 0 10*3/uL (ref 0.0–0.1)
Basophils Relative: 0 %
Eosinophils Absolute: 0 10*3/uL (ref 0.0–0.7)
Eosinophils Relative: 0 %
HEMATOCRIT: 43.3 % (ref 39.0–52.0)
HEMOGLOBIN: 14.2 g/dL (ref 13.0–17.0)
LYMPHS ABS: 1 10*3/uL (ref 0.7–4.0)
Lymphocytes Relative: 4 %
MCH: 30.9 pg (ref 26.0–34.0)
MCHC: 32.8 g/dL (ref 30.0–36.0)
MCV: 94.1 fL (ref 78.0–100.0)
MONO ABS: 1.1 10*3/uL — AB (ref 0.1–1.0)
MONOS PCT: 5 %
NEUTROS ABS: 20.7 10*3/uL — AB (ref 1.7–7.7)
NEUTROS PCT: 91 %
Platelets: 287 10*3/uL (ref 150–400)
RBC: 4.6 MIL/uL (ref 4.22–5.81)
RDW: 13.2 % (ref 11.5–15.5)
WBC: 22.8 10*3/uL — ABNORMAL HIGH (ref 4.0–10.5)

## 2016-04-27 LAB — LACTIC ACID, PLASMA
Lactic Acid, Venous: 4.7 mmol/L (ref 0.5–1.9)
Lactic Acid, Venous: 2.9 mmol/L (ref 0.5–1.9)

## 2016-04-27 LAB — MRSA PCR SCREENING: MRSA by PCR: NEGATIVE

## 2016-04-27 LAB — PROTIME-INR
INR: 1.05
Prothrombin Time: 13.7 seconds (ref 11.4–15.2)

## 2016-04-27 LAB — LIPASE, BLOOD: Lipase: 16 U/L (ref 11–51)

## 2016-04-27 MED ORDER — HYDROCORTISONE NA SUCCINATE PF 100 MG IJ SOLR
50.0000 mg | Freq: Three times a day (TID) | INTRAMUSCULAR | Status: DC
  Administered 2016-04-27 – 2016-04-29 (×6): 50 mg via INTRAVENOUS
  Filled 2016-04-27 (×6): qty 2

## 2016-04-27 MED ORDER — BACLOFEN 20 MG PO TABS
20.0000 mg | ORAL_TABLET | Freq: Four times a day (QID) | ORAL | Status: DC
  Administered 2016-04-27 – 2016-05-02 (×21): 20 mg
  Filled 2016-04-27 (×2): qty 2
  Filled 2016-04-27: qty 1
  Filled 2016-04-27 (×3): qty 2
  Filled 2016-04-27: qty 1
  Filled 2016-04-27 (×3): qty 2
  Filled 2016-04-27: qty 1
  Filled 2016-04-27 (×4): qty 2
  Filled 2016-04-27: qty 1
  Filled 2016-04-27: qty 2
  Filled 2016-04-27 (×4): qty 1
  Filled 2016-04-27: qty 2

## 2016-04-27 MED ORDER — PIPERACILLIN-TAZOBACTAM 3.375 G IVPB 30 MIN
3.3750 g | Freq: Once | INTRAVENOUS | Status: AC
  Administered 2016-04-27: 3.375 g via INTRAVENOUS
  Filled 2016-04-27: qty 50

## 2016-04-27 MED ORDER — SODIUM CHLORIDE 0.9 % IV SOLN
1000.0000 mL | INTRAVENOUS | Status: DC
  Administered 2016-04-27 – 2016-04-29 (×4): 1000 mL via INTRAVENOUS

## 2016-04-27 MED ORDER — IPRATROPIUM-ALBUTEROL 0.5-2.5 (3) MG/3ML IN SOLN
3.0000 mL | Freq: Two times a day (BID) | RESPIRATORY_TRACT | Status: DC
  Administered 2016-04-28: 3 mL via RESPIRATORY_TRACT
  Filled 2016-04-27 (×2): qty 3

## 2016-04-27 MED ORDER — VANCOMYCIN HCL IN DEXTROSE 1-5 GM/200ML-% IV SOLN
1000.0000 mg | Freq: Three times a day (TID) | INTRAVENOUS | Status: DC
  Administered 2016-04-27 – 2016-04-28 (×4): 1000 mg via INTRAVENOUS
  Filled 2016-04-27 (×8): qty 200

## 2016-04-27 MED ORDER — SENNA 8.6 MG PO TABS
2.0000 | ORAL_TABLET | Freq: Every day | ORAL | Status: DC
  Administered 2016-04-27 – 2016-05-01 (×4): 17.2 mg via ORAL
  Filled 2016-04-27 (×4): qty 2

## 2016-04-27 MED ORDER — IBUPROFEN 100 MG/5ML PO SUSP: 600.0000 mg | Freq: Four times a day (QID) | ORAL | Status: DC | PRN

## 2016-04-27 MED ORDER — DANTROLENE SODIUM 100 MG PO CAPS
200.0000 mg | ORAL_CAPSULE | Freq: Every day | ORAL | Status: DC
  Administered 2016-04-27 – 2016-05-01 (×5): 200 mg
  Filled 2016-04-27 (×8): qty 2

## 2016-04-27 MED ORDER — POLYVINYL ALCOHOL 1.4 % OP SOLN
1.0000 [drp] | Freq: Two times a day (BID) | OPHTHALMIC | Status: DC
  Administered 2016-04-27 – 2016-05-02 (×10): 1 [drp] via OPHTHALMIC
  Filled 2016-04-27 (×2): qty 15

## 2016-04-27 MED ORDER — BUPROPION HCL 75 MG PO TABS
75.0000 mg | ORAL_TABLET | Freq: Three times a day (TID) | ORAL | Status: DC
  Administered 2016-04-27 – 2016-05-02 (×15): 75 mg
  Filled 2016-04-27 (×20): qty 1

## 2016-04-27 MED ORDER — IBUPROFEN 100 MG/5ML PO SUSP
600.0000 mg | Freq: Four times a day (QID) | ORAL | Status: DC | PRN
  Administered 2016-04-27: 600 mg
  Filled 2016-04-27: qty 30

## 2016-04-27 MED ORDER — HYDROCODONE-ACETAMINOPHEN 7.5-325 MG PO TABS
1.0000 | ORAL_TABLET | Freq: Every day | ORAL | Status: DC | PRN
  Administered 2016-04-28: 1
  Filled 2016-04-27: qty 1

## 2016-04-27 MED ORDER — OSMOLITE 1.5 CAL PO LIQD: 80.0000 mL/h | ORAL | Status: DC

## 2016-04-27 MED ORDER — DIAZEPAM 5 MG PO TABS
5.0000 mg | ORAL_TABLET | Freq: Every evening | ORAL | Status: DC | PRN
  Administered 2016-04-29: 5 mg
  Filled 2016-04-27: qty 1

## 2016-04-27 MED ORDER — LORATADINE 10 MG PO TABS
10.0000 mg | ORAL_TABLET | Freq: Every day | ORAL | Status: DC
  Administered 2016-04-27 – 2016-04-28 (×2): 10 mg via ORAL
  Filled 2016-04-27 (×3): qty 1

## 2016-04-27 MED ORDER — SODIUM CHLORIDE 0.9 % IV BOLUS (SEPSIS)
500.0000 mL | Freq: Once | INTRAVENOUS | Status: AC
  Administered 2016-04-27: 500 mL via INTRAVENOUS

## 2016-04-27 MED ORDER — JEVITY 1.5 CAL/FIBER PO LIQD
237.0000 mL | Freq: Four times a day (QID) | ORAL | Status: DC
Start: 2016-04-27 — End: 2016-04-28
  Administered 2016-04-28: 237 mL
  Filled 2016-04-27 (×8): qty 1000

## 2016-04-27 MED ORDER — TIZANIDINE HCL 4 MG PO TABS: 2.0000 mg | ORAL_TABLET | Freq: Four times a day (QID) | ORAL | Status: DC | PRN

## 2016-04-27 MED ORDER — SODIUM CHLORIDE 0.9 % IV BOLUS (SEPSIS)
1000.0000 mL | Freq: Once | INTRAVENOUS | Status: AC
  Administered 2016-04-27: 1000 mL via INTRAVENOUS

## 2016-04-27 MED ORDER — ACETAMINOPHEN 650 MG RE SUPP
650.0000 mg | Freq: Once | RECTAL | Status: AC
  Administered 2016-04-27: 650 mg via RECTAL
  Filled 2016-04-27: qty 1

## 2016-04-27 MED ORDER — ACETAMINOPHEN 325 MG PO TABS
650.0000 mg | ORAL_TABLET | Freq: Four times a day (QID) | ORAL | Status: DC | PRN
  Administered 2016-04-28: 650 mg via ORAL
  Filled 2016-04-27: qty 2

## 2016-04-27 MED ORDER — BACITRACIN-NEOMYCIN-POLYMYXIN 400-5-5000 EX OINT: 1.0000 "application " | TOPICAL_OINTMENT | Freq: Four times a day (QID) | CUTANEOUS | Status: DC | PRN

## 2016-04-27 MED ORDER — PIPERACILLIN-TAZOBACTAM 3.375 G IVPB
3.3750 g | Freq: Three times a day (TID) | INTRAVENOUS | Status: DC
  Administered 2016-04-27 – 2016-05-02 (×14): 3.375 g via INTRAVENOUS
  Filled 2016-04-27 (×20): qty 50

## 2016-04-27 MED ORDER — POLYETHYLENE GLYCOL 3350 17 G PO PACK
17.0000 g | PACK | Freq: Every evening | ORAL | Status: DC
  Administered 2016-04-27 – 2016-05-02 (×4): 17 g
  Filled 2016-04-27 (×5): qty 1

## 2016-04-27 MED ORDER — BISACODYL 10 MG RE SUPP: 10.0000 mg | Freq: Once | RECTAL | Status: DC | PRN

## 2016-04-27 MED ORDER — ENOXAPARIN SODIUM 40 MG/0.4ML ~~LOC~~ SOLN
40.0000 mg | SUBCUTANEOUS | Status: DC
  Administered 2016-04-27 – 2016-05-02 (×6): 40 mg via SUBCUTANEOUS
  Filled 2016-04-27 (×6): qty 0.4

## 2016-04-27 MED ORDER — ACETAMINOPHEN 650 MG RE SUPP: 650.0000 mg | Freq: Four times a day (QID) | RECTAL | Status: DC | PRN

## 2016-04-27 MED ORDER — SODIUM CHLORIDE 0.9 % IV SOLN
1500.0000 mg | Freq: Once | INTRAVENOUS | Status: AC
  Administered 2016-04-27: 1500 mg via INTRAVENOUS
  Filled 2016-04-27: qty 1500

## 2016-04-27 MED ORDER — ACETAMINOPHEN 650 MG RE SUPP
650.0000 mg | Freq: Four times a day (QID) | RECTAL | Status: DC | PRN
  Administered 2016-04-27: 650 mg via RECTAL
  Filled 2016-04-27: qty 1

## 2016-04-27 MED ORDER — SODIUM CHLORIDE 0.9% FLUSH
3.0000 mL | Freq: Two times a day (BID) | INTRAVENOUS | Status: DC
  Administered 2016-04-27 – 2016-05-02 (×8): 3 mL via INTRAVENOUS

## 2016-04-27 MED ORDER — LEVETIRACETAM 750 MG PO TABS
1500.0000 mg | ORAL_TABLET | Freq: Two times a day (BID) | ORAL | Status: DC
  Administered 2016-04-27 – 2016-04-28 (×2): 1500 mg via ORAL
  Filled 2016-04-27 (×2): qty 2

## 2016-04-27 MED ORDER — ONDANSETRON HCL 4 MG PO TABS: 4.0000 mg | ORAL_TABLET | Freq: Four times a day (QID) | ORAL | Status: DC | PRN

## 2016-04-27 MED ORDER — IBUPROFEN 100 MG/5ML PO SUSP
600.0000 mg | Freq: Once | ORAL | Status: DC
  Filled 2016-04-27: qty 30

## 2016-04-27 MED ORDER — ONDANSETRON HCL 4 MG/2ML IJ SOLN: 4.0000 mg | Freq: Four times a day (QID) | INTRAMUSCULAR | Status: DC | PRN

## 2016-04-27 MED ORDER — OMEPRAZOLE 2 MG/ML ORAL SUSPENSION
40.0000 mg | Freq: Every day | ORAL | Status: DC
  Administered 2016-04-28 – 2016-05-02 (×5): 40 mg
  Filled 2016-04-27 (×7): qty 20

## 2016-04-27 NOTE — Progress Notes (Signed)
Verified with bedside RN that patient is not Alert/Oriented. Patients mother able to verify patient information x2. Password entered into patients admission record.

## 2016-04-27 NOTE — Progress Notes (Signed)
Pharmacy Antibiotic Note  Terry Palmer is a 41 y.o. male admitted on 04/27/2016 with sepsis and UTI.    Pharmacy has been consulted for Vancomycin and Zosyn dosing.   Vancomycin 1500 mg IV and Zosyn 3.375 gm IV given in ED ~11am.  Tmax 103.1, WBC 22.8. Hx multiple sclerosis, quadriplegic, recurrent UTIs.  Blood and urine cultures sent.  Plan:  Vancomycin 1gm IV q8hrs.  Target troughs 15-20 mcg/ml.  Zosyn 3.375 gm IV q8hrs (each over 4 hrs)  Follow renal function, culture data, progress.  Vanc trough level at steady state.  Height: 6\' 4"  (193 cm) Weight: 177 lb (80.3 kg) IBW/kg (Calculated) : 86.8  Temp (24hrs), Avg:102.9 F (39.4 C), Min:102.7 F (39.3 C), Max:103.1 F (39.5 C)   Recent Labs Lab 04/27/16 1052 04/27/16 1110 04/27/16 1341  WBC 22.8*  --   --   CREATININE 0.79  --   --   LATICACIDVEN  --  4.70* 2.92*    Estimated Creatinine Clearance: 136.6 mL/min (by C-G formula based on SCr of 0.79 mg/dL).    No Known Allergies  Antimicrobials this admission:  Vancomycin 4/20>>  Zosyn 4/20>>  Dose adjustments this admission:  n/a  Microbiology results:   4/20 blood x 2 - sent   4/20 urine - sent   4/20 MRSA PCR - sent     Thank you for allowing pharmacy to be a part of this patient's care.  Arty Baumgartner, [place] Pager: [phone] 04/27/2016 6:02 PM

## 2016-04-27 NOTE — ED Provider Notes (Signed)
[facility] DEPT Provider Note   CSN: [id number] Arrival date & time: 04/27/16  1006     History   Chief Complaint Chief Complaint  Patient presents with  . Fever    HPI Terry Palmer is a 41 y.o. male.  HPI   Pt with hx multiple sclerosis, quadraparesis, dementia, neurogenic bladder with indwelling suprapubic cath, asthma p/w fever, N/V.  Pt brought in by EMS without family or caregiver.  He is unable to communicate.  Level V caveat, pt is nonverbal, noncommunicative.    Past Medical History:  Diagnosis Date  . Aspiration pneumonia ([place]) 01/20/2013  . Bladder calculi   . Childhood asthma   . 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 Depression   . Dysphagia   . 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 MS (multiple sclerosis) ([place])   . Neurogenic bladder   . Neuromuscular disorder ([place])    Quadraperesis  . Normocytic anemia 05/28/2011  . Quadriparesis (muscle weakness) ([place]) 03/12/2011  . Quadriplegia and quadriparesis ([place]) 12/24/2014  . Recurrent upper respiratory infection (URI)   . Recurrent UTI   . Shortness of breath     Patient Active Problem List   Diagnosis Date Noted  . 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 Lactic acidosis 04/27/2016  . Acute cystitis with hematuria   . Suprapubic catheter ([place])   . G tube feedings ([place])   . Flexion contractures   . Encephalopathy   . Bacteremia 05/27/2015  . Sepsis ([place]) 05/25/2015  . Altered mental state 05/25/2015  . Altered mental status 05/25/2015  . Fever, unspecified 03/04/2015  . Quadriplegia and quadriparesis ([place]) 12/24/2014  . 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 Protein-calorie malnutrition, severe ([place]) 10/21/2014  . Sepsis secondary to UTI ([place]) 10/20/2014  . Leukocytosis 10/20/2014  . Acute respiratory failure with hypoxia ([place]) 10/20/2014  . Seizure disorder ([place]) 10/20/2014  . Aspiration pneumonitis ([place])  10/20/2014  . Neurogenic bladder 05/28/2011  . Multiple sclerosis ([place]) 04/20/2011  . FTT (failure to thrive) in adult 04/20/2011  . Sacral decubitus ulcer, stage II 04/20/2011  . Quadriparesis (muscle weakness) ([place]) 03/12/2011    Past Surgical History:  Procedure Laterality Date  . GASTROSTOMY  04/16/2011   Procedure: GASTROSTOMY;  Surgeon: Zenovia Jarred, MD;  Location: [facility];  Service: General;  Laterality: N/A;  Open G-Tube placement  . LUMBAR PUNCTURE  10/12/2002       Home Medications    Prior to Admission medications   Medication Sig Start Date End Date Taking? Authorizing Provider  acetaminophen (TYLENOL) 500 MG tablet Take 1 tablet (500 mg total) by mouth every 8 (eight) hours as needed for fever (or pain). Patient taking differently: Take 1,000 mg by mouth 2 (two) times daily.  03/05/15  Yes Debbe Odea, MD  baclofen (LIORESAL) 20 MG tablet Place 1 tablet (20 mg total) into feeding tube 4 (four) times daily. 07/13/15  Yes Kathrynn Ducking, MD  bisacodyl (DULCOLAX) 10 MG suppository Place 10 mg rectally once as needed for moderate constipation.   Yes Historical Provider, MD  buPROPion (WELLBUTRIN) 75 MG tablet Place 1 tablet (75 mg total) into feeding tube 3 (three) times daily. 01/24/13  Yes Bonnielee Haff, MD  cholecalciferol (VITAMIN D) 1000 UNITS tablet Take 1 tablet (1,000 Units total) by mouth 2 (two) times daily. Patient taking differently: Give 5,000 Units by tube 2 (two) times daily.  01/24/13  Yes Bonnielee Haff, MD  Cranberry Juice  Powder 425 MG CAPS 1 capsule (425 mg total) by PEG Tube route 2 (two) times daily. 01/24/13  Yes Bonnielee Haff, MD  dantrolene (DANTRIUM) 50 MG capsule Give 200 mg by tube at bedtime.    Yes Historical Provider, MD  diazepam (VALIUM) 5 MG tablet Place 5 mg into feeding tube at bedtime as needed for anxiety (sleep).    Yes Historical Provider, MD  fexofenadine (ALLEGRA) 180 MG tablet Place 180 mg into feeding tube daily.   Yes Historical  Provider, MD  Garlic Oil 6269 MG CAPS Give 1,000 mg by tube daily with breakfast.    Yes Historical Provider, MD  HYDROcodone-acetaminophen (NORCO) 7.5-325 MG per tablet Place 1 tablet into feeding tube daily as needed (pain).    Yes Historical Provider, MD  ipratropium-albuterol (DUONEB) 0.5-2.5 (3) MG/3ML SOLN Take 3 mLs by nebulization 2 (two) times daily.   Yes Historical Provider, MD  Lactobacillus (ACIDOPHILUS PO) Place 1 capsule into feeding tube daily.    Yes Historical Provider, MD  levETIRAcetam (KEPPRA) 500 MG tablet Take 3 tablets (1,500 mg total) by mouth 2 (two) times daily. 05/29/15  Yes Jule Ser, DO  Multiple Vitamin (MULTIVITAMIN WITH MINERALS) TABS tablet Place 1 tablet into feeding tube daily. 01/24/13  Yes Bonnielee Haff, MD  NALTREXONE HCL PO Give 4.5 mg by tube at bedtime.   Yes Historical Provider, MD  neomycin-bacitracin-polymyxin (NEOSPORIN) [phone] ointment Apply 1 application topically 4 (four) times daily as needed (abraisions, irritations).   Yes Historical Provider, MD  Nutritional Supplements (FEEDING SUPPLEMENT, JEVITY 1.5 CAL,) LIQD Place 237 mLs into feeding tube 4 (four) times daily.   Yes Historical Provider, MD  nystatin (MYCOSTATIN/NYSTOP) 100000 UNIT/GM POWD Apply 1 g topically 3 (three) times daily. Patient taking differently: Apply 1 g topically daily. Applies to groin 01/24/13  Yes Bonnielee Haff, MD  omeprazole (PRILOSEC) 2 mg/mL SUSP Place 20 mLs (40 mg total) into feeding tube daily. 01/24/13  Yes Bonnielee Haff, MD  polyethylene glycol (MIRALAX / GLYCOLAX) packet Place 17 g into feeding tube daily. Patient taking differently: Place 17 g into feeding tube every evening.  01/24/13  Yes Bonnielee Haff, MD  polyvinyl alcohol (LIQUIFILM TEARS) 1.4 % ophthalmic solution Place 1 drop into both eyes 3 (three) times daily. Patient taking differently: Place 1 drop into both eyes 2 (two) times daily.  01/24/13  Yes Bonnielee Haff, MD  predniSONE 5 MG/5ML  solution Place 5 mg into feeding tube daily with breakfast. Filled 04-11-16   Yes Historical Provider, MD  Saline (SIMPLY SALINE) 0.9 % AERS Place 1 spray into both nostrils as needed (for nasal conjestion).   Yes Historical Provider, MD  senna (SENOKOT) 8.6 MG TABS tablet Take 2 tablets by mouth at bedtime.    Yes Historical Provider, MD  sodium phosphate (FLEET) enema Place 1 enema rectally once as needed (for constipation). follow package directions   Yes Historical Provider, MD  tiZANidine (ZANAFLEX) 2 MG tablet Take 1 tablet (2 mg total) by mouth every 6 (six) hours as needed for muscle spasms. Patient taking differently: Place 2 mg into feeding tube every 6 (six) hours as needed for muscle spasms.  11/25/15  Yes Kathrynn Ducking, MD  vitamin C (ASCORBIC ACID) 500 MG tablet Place 500 mg into feeding tube 2 (two) times daily.   Yes Historical Provider, MD  Water For Irrigation, Sterile (FREE WATER) SOLN Place 240 mLs into feeding tube every 4 (four) hours. 01/24/13  Yes Bonnielee Haff, MD  Nutritional Supplements (FEEDING  SUPPLEMENT, OSMOLITE 1.5 CAL,) LIQD Place 80 mL/hr into feeding tube See admin instructions. For 14 hours a day Patient not taking: Reported on 04/27/2016 05/29/15   Jule Ser, DO  predniSONE 5 MG/ML concentrated solution Place 2 mLs (10 mg total) into feeding tube daily with breakfast. Patient not taking: Reported on 04/27/2016 11/02/14   Robbie Lis, MD    Family History Family History  Problem Relation Age of Onset  . Asthma Mother     Social History Social History  Substance Use Topics  . Smoking status: Former Smoker    Types: Cigarettes    Quit date: 02/02/1999  . Smokeless tobacco: Former Systems developer  . Alcohol use No     Allergies   Patient has no known allergies.   Review of Systems Review of Systems  Unable to perform ROS: Patient nonverbal     Physical Exam Updated Vital Signs BP 131/73   Pulse (!) 106   Temp (!) 103.1 F (39.5 C) (Rectal)  Comment (Src): EDP made aware  Resp (!) 23   Wt 80.3 kg   SpO2 95%   BMI 21.55 kg/m   Physical Exam  Constitutional: He appears well-developed and well-nourished. No distress.  HENT:  Head: Normocephalic and atraumatic.  Neck: Neck supple.  Cardiovascular: Regular rhythm.  Tachycardia present.   Pulmonary/Chest: Effort normal. No respiratory distress. He has wheezes. He has rales.  Slightly limited exam due to patient moaning.    Abdominal: Soft. He exhibits no distension and no mass. There is no rebound and no guarding.  G tube in place   Genitourinary:  Genitourinary Comments: Suprapubic cath in place with dark yellow urine.    Neurological: He is alert. He exhibits abnormal muscle tone.  Contractures in all 4 extremities.  Eyes are open and pt blinking and occasionally moaning but does not communicate meaningfully or follow commands.    Skin: He is not diaphoretic.  Nursing note and vitals reviewed.    ED Treatments / Results  Labs (all labs ordered are listed, but only abnormal results are displayed) Labs Reviewed  COMPREHENSIVE METABOLIC PANEL - Abnormal; Notable for the following:       Result Value   Chloride 100 (*)    Glucose, Bld 118 (*)    Anion gap 18 (*)    All other components within normal limits  CBC WITH DIFFERENTIAL/PLATELET - Abnormal; Notable for the following:    WBC 22.8 (*)    Neutro Abs 20.7 (*)    Monocytes Absolute 1.1 (*)    All other components within normal limits  URINALYSIS, ROUTINE W REFLEX MICROSCOPIC - Abnormal; Notable for the following:    APPearance TURBID (*)    Specific Gravity, Urine <1.005 (*)    pH >9.0 (*)    Hgb urine dipstick MODERATE (*)    Protein, ur 100 (*)    Nitrite POSITIVE (*)    Leukocytes, UA LARGE (*)    All other components within normal limits  URINALYSIS, MICROSCOPIC (REFLEX) - Abnormal; Notable for the following:    Bacteria, UA MANY (*)    Squamous Epithelial / LPF 0-5 (*)    All other components within  normal limits  I-STAT CG4 LACTIC ACID, ED - Abnormal; Notable for the following:    Lactic Acid, Venous 4.70 (*)    All other components within normal limits  I-STAT CG4 LACTIC ACID, ED - Abnormal; Notable for the following:    Lactic Acid, Venous 2.92 (*)  All other components within normal limits  CULTURE, BLOOD (ROUTINE X 2)  CULTURE, BLOOD (ROUTINE X 2)  URINE CULTURE  PROTIME-INR  LIPASE, BLOOD  LACTIC ACID, PLASMA  LACTIC ACID, PLASMA    EKG  EKG Interpretation None       Radiology Dg Chest Port 1 View  Result Date: 04/27/2016 CLINICAL DATA:  Sepsis. EXAM: PORTABLE CHEST 1 VIEW COMPARISON:  02/10/2016 and prior radiographs FINDINGS: The cardiomediastinal silhouette is unremarkable. This is a low volume film. There is no evidence of focal airspace disease, pulmonary edema, suspicious pulmonary nodule/mass, pleural effusion, or pneumothorax. No acute bony abnormalities are identified. IMPRESSION: No active disease. Electronically Signed   By: Margarette Canada M.D.   On: 04/27/2016 11:42    Procedures Procedures (including critical care time)  CRITICAL CARE Performed by: Clayton Bibles   Total critical care time: 30 minutes  Critical care time was exclusive of separately billable procedures and treating other patients.  Critical care was necessary to treat or prevent imminent or life-threatening deterioration.  Critical care was time spent personally by me on the following activities: development of treatment plan with patient and/or surrogate as well as nursing, discussions with consultants, evaluation of patient's response to treatment, examination of patient, obtaining history from patient or surrogate, ordering and performing treatments and interventions, ordering and review of laboratory studies, ordering and review of radiographic studies, pulse oximetry and re-evaluation of patient's condition.      Medications Ordered in ED Medications  0.9 %  sodium chloride  infusion (1,000 mLs Intravenous New Bag/Given 04/27/16 1105)  ibuprofen (ADVIL,MOTRIN) 100 MG/5ML suspension 600 mg (0 mg Per Tube Hold 04/27/16 1337)  acetaminophen (TYLENOL) suppository 650 mg (not administered)  ibuprofen (ADVIL,MOTRIN) 100 MG/5ML suspension 600 mg (600 mg Per Tube Given 04/27/16 1337)  hydrocortisone sodium succinate (SOLU-CORTEF) 100 MG injection 50 mg (50 mg Intravenous Given 04/27/16 1433)  sodium chloride 0.9 % bolus 1,000 mL (0 mLs Intravenous Stopped 04/27/16 1254)    And  sodium chloride 0.9 % bolus 1,000 mL (0 mLs Intravenous Stopped 04/27/16 1254)    And  sodium chloride 0.9 % bolus 500 mL (0 mLs Intravenous Stopped 04/27/16 1254)  acetaminophen (TYLENOL) suppository 650 mg (650 mg Rectal Given 04/27/16 1038)  vancomycin (VANCOCIN) 1,500 mg in sodium chloride 0.9 % 500 mL IVPB (0 mg Intravenous Stopped 04/27/16 1312)  piperacillin-tazobactam (ZOSYN) IVPB 3.375 g (0 g Intravenous Stopped 04/27/16 1158)     Initial Impression / Assessment and Plan / ED Course  I have reviewed the triage vital signs and the nursing notes.  Pertinent labs & imaging results that were available during my care of the patient were reviewed by me and considered in my medical decision making (see chart for details).     Febrile patient with baseline of only being able to communicate through blinking and smiling brought in for fever, N/V.  Sepsis protocol followed.  Vanc/Zosyn ordered. CXR negative.  UA appears infected, obtained from indwelling suprapubic catheter - culture pending.  Pt reassessed and continued to be tachycardic, no increased work of breathing, skin is warm, distal pulses intact.  Admitted to [facility], Dr Nehemiah Settle accepting.    Final Clinical Impressions(s) / ED Diagnoses   Final diagnoses:  Sepsis, due to unspecified organism [facility])    New Prescriptions New Prescriptions   No medications on file     [place], PA-C 04/27/16 [place],  MD 05/08/16 1408

## 2016-04-27 NOTE — H&P (Signed)
History and Physical  NGAI PARCELL PVX:[id number] DOB: 03/15/1975 DOA: 04/27/2016  Referring physician: Clayton Bibles, PA-C, ED provider PCP: [name], MD  Outpatient Specialists:    Patient Coming From: Home  Chief Complaint: Vomiting, confusion  HPI: Terry Palmer is a 41 y.o. 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 Patient presents with a rapid progression of increasing confusion, diminished interaction, and diminished urinary output since last night that is worse this morning area the patient additionally vomited and began to have a fever. His wife called EMS and brought the patient here. No palliating or provoking factors.  Emergency Department Course: Patient received fluid resuscitation 30 mL/kg. Started on broad-spectrum antibiotics with blood culture and urine culture sent. White count was 22.8 with a lactic acid of 4.7. All other blood work relatively normal. UA suggestive of UTI.  Review of Systems:  Unable to provide  Past Medical History:  Diagnosis Date  . Aspiration pneumonia ([place]) 01/20/2013  . Bladder calculi   . Childhood asthma   . 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 Depression   . Dysphagia   . 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 MS (multiple sclerosis) ([place])   . Neurogenic bladder   . Neuromuscular disorder ([place])    Quadraperesis  . Normocytic anemia 05/28/2011  . Quadriparesis (muscle weakness) ([place]) 03/12/2011  . Quadriplegia and quadriparesis ([place]) 12/24/2014  . Recurrent upper respiratory infection (URI)   . Recurrent UTI   . Shortness of breath    Past Surgical History:  Procedure Laterality Date  . GASTROSTOMY  04/16/2011   Procedure: GASTROSTOMY;  Surgeon: Zenovia Jarred, MD;  Location: [facility];  Service: General;  Laterality: N/A;  Open G-Tube placement  . LUMBAR PUNCTURE  10/12/2002    Social History:  reports that he quit smoking about 17 years ago. His smoking use included Cigarettes. He has quit using smokeless tobacco. He reports that he does not drink alcohol. His drug history is not on file. Patient lives at Home  No Known Allergies  Family History  Problem Relation Age of Onset  . Asthma Mother       Prior to Admission medications   Medication Sig Start Date End Date Taking? Authorizing Provider  acetaminophen (TYLENOL) 500 MG tablet Take 1 tablet (500 mg total) by mouth every 8 (eight) hours as needed for fever (or pain). Patient taking differently: Take 1,000 mg by mouth 2 (two) times daily.  03/05/15   Debbe Odea, MD  baclofen (LIORESAL) 20 MG tablet Place 1 tablet (20 mg total) into feeding tube 4 (four) times daily. 07/13/15   Kathrynn Ducking, MD  bisacodyl (DULCOLAX) 10 MG suppository Place 10 mg rectally once as needed for moderate constipation.    Historical Provider, MD  buPROPion (WELLBUTRIN) 75 MG tablet Place 1 tablet (75 mg total) into feeding tube 3 (three) times daily. 01/24/13   Bonnielee Haff, MD  cholecalciferol (VITAMIN D) 1000 UNITS tablet Take 1 tablet (1,000 Units total) by mouth 2 (two) times daily. Patient taking differently: Give 5,000 Units by tube 2 (two) times daily.  01/24/13   Bonnielee Haff, MD  Cranberry Juice Powder 425 MG CAPS 1 capsule (425 mg total) by PEG Tube route 2 (two) times daily. 01/24/13   Bonnielee Haff, MD  dantrolene (DANTRIUM) 50 MG capsule Give 200 mg by tube at bedtime.     Historical Provider, MD  diazepam (VALIUM) 5 MG tablet Place 5 mg into feeding tube at bedtime as needed for anxiety (sleep).     Historical Provider, MD  fexofenadine (ALLEGRA) 180 MG tablet Place 180 mg into feeding tube daily.    Historical Provider, MD  Garlic Oil 6962 MG CAPS Give 1,000 mg by tube daily with breakfast.     Historical Provider, MD  HYDROcodone-acetaminophen (NORCO) 7.5-325 MG per tablet Place 1 tablet into feeding tube daily  as needed (pain).     Historical Provider, MD  ipratropium-albuterol (DUONEB) 0.5-2.5 (3) MG/3ML SOLN Take 3 mLs by nebulization 2 (two) times daily.    Historical Provider, MD  Lactobacillus (ACIDOPHILUS PO) Place 1 capsule into feeding tube daily.     Historical Provider, MD  levETIRAcetam (KEPPRA) 500 MG tablet Take 3 tablets (1,500 mg total) by mouth 2 (two) times daily. 05/29/15   Jule Ser, DO  Multiple Vitamin (MULTIVITAMIN WITH MINERALS) TABS tablet Place 1 tablet into feeding tube daily. 01/24/13   Bonnielee Haff, MD  NALTREXONE HCL PO Give 4.5 mg by tube at bedtime.    Historical Provider, MD  neomycin-bacitracin-polymyxin (NEOSPORIN) [phone] ointment Apply 1 application topically 4 (four) times daily as needed (abraisions, irritations).    Historical Provider, MD  Nutritional Supplements (FEEDING SUPPLEMENT, OSMOLITE 1.5 CAL,) LIQD Place 80 mL/hr into feeding tube See admin instructions. For 14 hours a day Patient taking differently: Place 75 mL/hr into feeding tube See admin instructions. For 14 hours a day 05/29/15   Jule Ser, DO  nystatin (MYCOSTATIN/NYSTOP) 100000 UNIT/GM POWD Apply 1 g topically 3 (three) times daily. Patient taking differently: Apply 1 g topically daily. Applies to groin 01/24/13   Bonnielee Haff, MD  omeprazole (PRILOSEC) 2 mg/mL SUSP Place 20 mLs (40 mg total) into feeding tube daily. 01/24/13   Bonnielee Haff, MD  polyethylene glycol (MIRALAX / GLYCOLAX) packet Place 17 g into feeding tube daily. Patient taking differently: Place 17 g into feeding tube every evening.  01/24/13   Bonnielee Haff, MD  polyvinyl alcohol (LIQUIFILM TEARS) 1.4 % ophthalmic solution Place 1 drop into both eyes 3 (three) times daily. Patient taking differently: Place 1 drop into both eyes 2 (two) times daily.  01/24/13   Bonnielee Haff, MD  predniSONE 5 MG/ML concentrated solution Place 2 mLs (10 mg total) into feeding tube daily with breakfast. Patient taking differently: Place 5  mg into feeding tube daily with breakfast.  11/02/14   Robbie Lis, MD  Saline (SIMPLY SALINE) 0.9 % AERS Place 1 spray into both nostrils as needed (for nasal conjestion).    Historical Provider, MD  senna (SENOKOT) 8.6 MG TABS tablet Take 2 tablets by mouth at bedtime.     Historical Provider, MD  sodium phosphate (FLEET) enema Place 1 enema rectally once as needed (for constipation). follow package directions    Historical Provider, MD  tiZANidine (ZANAFLEX) 2 MG tablet Take 1 tablet (2 mg total) by mouth every 6 (six) hours as needed for muscle spasms. 11/25/15   Kathrynn Ducking, MD  vitamin C (ASCORBIC ACID) 500 MG tablet Place 500 mg into feeding tube 2 (two) times daily.    Historical Provider, MD  Water For Irrigation, Sterile (FREE WATER) SOLN Place 240 mLs into feeding tube every 4 (four) hours. 01/24/13   Bonnielee Haff, MD    Physical Exam: BP 137/79   Pulse (!) 113   Temp (!) 102.7 F (39.3 C) (Rectal)   Resp (!) 25   Wt 80.3  kg (177 lb)   SpO2 98%   BMI 21.55 kg/m   General: Middle-aged male. Somnolent. No acute cardiopulmonary distress.  HEENT: Normocephalic atraumatic.  Right and left ears normal in appearance.  Pupils equal, round, reactive to light. Extraocular muscles are intact. Sclerae anicteric and noninjected.  Moist mucosal membranes. No mucosal lesions.  Neck: Neck supple without lymphadenopathy. No carotid bruits. No masses palpated.  Cardiovascular: Regular rate with normal S1-S2 sounds. No murmurs, rubs, gallops auscultated. No JVD.  Respiratory: Good respiratory effort with no wheezes, rales, rhonchi. Lungs clear to auscultation bilaterally.  No accessory muscle use. Abdomen: Soft, nondistended. Tender to palpation. Active bowel sounds. No masses or hepatosplenomegaly  Skin: Healed sacral ulcers. No rashes, lesions, or ulcerations.  Dry, warm to touch. 2+ dorsalis pedis and radial pulses. Musculoskeletal: Significant contractures of upper and lower  extremities bilaterally secondary to quadriplegia. No erythema to joints. Psychiatric: Intact judgment and insight. Pleasant and cooperative. Neurologic: Unable to assess           Labs on Admission: I have personally reviewed following labs and imaging studies  CBC:  Recent Labs Lab 04/27/16 1052  WBC 22.8*  NEUTROABS 20.7*  HGB 14.2  HCT 43.3  MCV 94.1  PLT 824   Basic Metabolic Panel:  Recent Labs Lab 04/27/16 1052  NA 140  K 4.3  CL 100*  CO2 22  GLUCOSE 118*  BUN 15  CREATININE 0.79  CALCIUM 9.8   GFR: Estimated Creatinine Clearance: 136.6 mL/min (by C-G formula based on SCr of 0.79 mg/dL). Liver Function Tests:  Recent Labs Lab 04/27/16 1052  AST 29  ALT 22  ALKPHOS 89  BILITOT 0.7  PROT 8.0  ALBUMIN 4.5    Recent Labs Lab 04/27/16 1052  LIPASE 16   No results for input(s): AMMONIA in the last 168 hours. Coagulation Profile:  Recent Labs Lab 04/27/16 1052  INR 1.05   Cardiac Enzymes: No results for input(s): CKTOTAL, CKMB, CKMBINDEX, TROPONINI in the last 168 hours. BNP (last 3 results) No results for input(s): PROBNP in the last 8760 hours. HbA1C: No results for input(s): HGBA1C in the last 72 hours. CBG: No results for input(s): GLUCAP in the last 168 hours. Lipid Profile: No results for input(s): CHOL, HDL, LDLCALC, TRIG, CHOLHDL, LDLDIRECT in the last 72 hours. Thyroid Function Tests: No results for input(s): TSH, T4TOTAL, FREET4, T3FREE, THYROIDAB in the last 72 hours. Anemia Panel: No results for input(s): VITAMINB12, FOLATE, FERRITIN, TIBC, IRON, RETICCTPCT in the last 72 hours. Urine analysis:    Component Value Date/Time   COLORURINE YELLOW 04/27/2016 1105   APPEARANCEUR TURBID (A) 04/27/2016 1105   LABSPEC <1.005 (L) 04/27/2016 1105   PHURINE >9.0 (H) 04/27/2016 1105   GLUCOSEU NEGATIVE 04/27/2016 1105   HGBUR MODERATE (A) 04/27/2016 1105   BILIRUBINUR NEGATIVE 04/27/2016 1105   KETONESUR NEGATIVE 04/27/2016 1105    PROTEINUR 100 (A) 04/27/2016 1105   UROBILINOGEN 0.2 10/20/2014 1328   NITRITE POSITIVE (A) 04/27/2016 1105   LEUKOCYTESUR LARGE (A) 04/27/2016 1105   Sepsis Labs: @LABRCNTIP (procalcitonin:4,lacticidven:4) )No results found for this or any previous visit (from the past 240 hour(s)).   Radiological Exams on Admission: Dg Chest Port 1 View  Result Date: 04/27/2016 CLINICAL DATA:  Sepsis. EXAM: PORTABLE CHEST 1 VIEW COMPARISON:  02/10/2016 and prior radiographs FINDINGS: The cardiomediastinal silhouette is unremarkable. This is a low volume film. There is no evidence of focal airspace disease, pulmonary edema, suspicious pulmonary nodule/mass, pleural effusion, or pneumothorax. No acute bony  abnormalities are identified. IMPRESSION: No active disease. Electronically Signed   By: Margarette Canada M.D.   On: 04/27/2016 11:42    EKG: Independently reviewed. 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  We also discussed care given by the ED provider.  #1 sepsis  Admit to stepdown  Continue broad-spectrum enema attics  Sepsis recheck done  Continue IV fluids for hydration #2 acute cystitis with hematuria  Urine culture obtained, blood cultures obtained  Continue broad-spectrum antibiotics  In the past patient had positive urine culture for Pseudomonas, Proteus, enterococcus. Will continue Zosyn for now as this is a potential etiology. #3 steroid-induced adrenal suppression  Due to the patient being septic, will give hydrocortisone for stress dose steroids at 50 mg every 8  hours. #4 neurogenic bladder with indwelling catheter  Replace indwelling catheter #5 dementia #6 G-tube feedings  Continue tube feeds #7 quadriparesis  Frequent turning to avoid decubitus ulcers. #8 lactic acidosis  Trend lactic acids.  DVT prophylaxis: lovenox Consultants: none Code Status: Full Family Communication: wife  Disposition Plan: admit to step-down   Truett Mainland, DO [facility] Pager [phone]  If 7PM-7AM, please contact night-coverage [email or web address] Password [email or web address]

## 2016-04-27 NOTE — Progress Notes (Signed)
Lactic acid of 4.7 critical value reported to covering MD. 559mL bolus ordered.  Second lactic drawn was 2.7 and MD made aware. No new orders at this time.

## 2016-04-27 NOTE — Progress Notes (Addendum)
Patient ID: Terry Palmer, male   DOB: Feb 02, 1975, 41 y.o.   MRN: [id number]  Sepsis - Repeat Assessment  Performed at:    [facility]     Blood pressure 137/79, pulse (!) 113, temperature (!) 102.7 F (39.3 C), temperature source Rectal, resp. rate (!) 25, weight 80.3 kg (177 lb), SpO2 98 %.  Heart:     Tachycardic and regular rate  Lungs:    CTA  Capillary Refill:   <2 sec  Peripheral Pulse:   Radial pulse palpable, Dorsalis pedis pulse  palpable and Posterior tibialis pulse  palpable  Skin:     Flushed and Diaphoretic    Truett Mainland, DO 04/27/2016 1:26 PM

## 2016-04-27 NOTE — ED Triage Notes (Signed)
Pt. Has a hx of MS and became fevered and n/v this am.  Pt. Has a caregiver that was able to give information. Paramedic reports that caregiver told them pt. Can communicate by blinking.  Pt. Is not able to communicate to this RN.  Pt. Is warm to touch.  Pt. Arrived with a foley.  Airway patent. No s/s of distress at this time.

## 2016-04-28 DIAGNOSIS — N3001 Acute cystitis with hematuria: Secondary | ICD-10-CM

## 2016-04-28 DIAGNOSIS — T380X5A Adverse effect of glucocorticoids and synthetic analogues, initial encounter: Secondary | ICD-10-CM

## 2016-04-28 DIAGNOSIS — L89152 Pressure ulcer of sacral region, stage 2: Secondary | ICD-10-CM

## 2016-04-28 DIAGNOSIS — Z931 Gastrostomy status: Secondary | ICD-10-CM

## 2016-04-28 DIAGNOSIS — N319 Neuromuscular dysfunction of bladder, unspecified: Secondary | ICD-10-CM

## 2016-04-28 DIAGNOSIS — E872 Acidosis: Secondary | ICD-10-CM

## 2016-04-28 DIAGNOSIS — E2740 Unspecified adrenocortical insufficiency: Secondary | ICD-10-CM

## 2016-04-28 DIAGNOSIS — G8250 Quadriplegia, unspecified: Secondary | ICD-10-CM

## 2016-04-28 DIAGNOSIS — F0280 Dementia in other diseases classified elsewhere without behavioral disturbance: Secondary | ICD-10-CM

## 2016-04-28 DIAGNOSIS — J189 Pneumonia, unspecified organism: Secondary | ICD-10-CM

## 2016-04-28 DIAGNOSIS — R509 Fever, unspecified: Secondary | ICD-10-CM

## 2016-04-28 DIAGNOSIS — A419 Sepsis, unspecified organism: Secondary | ICD-10-CM

## 2016-04-28 DIAGNOSIS — G35 Multiple sclerosis: Secondary | ICD-10-CM

## 2016-04-28 LAB — CBC
HCT: 34.6 % — ABNORMAL LOW (ref 39.0–52.0)
HEMOGLOBIN: 11.3 g/dL — AB (ref 13.0–17.0)
MCH: 30.5 pg (ref 26.0–34.0)
MCHC: 32.7 g/dL (ref 30.0–36.0)
MCV: 93.3 fL (ref 78.0–100.0)
Platelets: 211 10*3/uL (ref 150–400)
RBC: 3.71 MIL/uL — ABNORMAL LOW (ref 4.22–5.81)
RDW: 13.2 % (ref 11.5–15.5)
WBC: 17.8 10*3/uL — ABNORMAL HIGH (ref 4.0–10.5)

## 2016-04-28 LAB — BASIC METABOLIC PANEL
Anion gap: 12 (ref 5–15)
BUN: 8 mg/dL (ref 6–20)
CO2: 19 mmol/L — AB (ref 22–32)
CREATININE: 0.51 mg/dL — AB (ref 0.61–1.24)
Calcium: 8.2 mg/dL — ABNORMAL LOW (ref 8.9–10.3)
Chloride: 104 mmol/L (ref 101–111)
GFR calc Af Amer: >60 mL/min (ref >60)
GFR calc non Af Amer: >60 mL/min (ref >60)
GLUCOSE: 80 mg/dL (ref 65–99)
Potassium: 3.6 mmol/L (ref 3.5–5.1)
Sodium: 135 mmol/L (ref 135–145)

## 2016-04-28 LAB — URINE CULTURE

## 2016-04-28 LAB — HIV ANTIBODY (ROUTINE TESTING W REFLEX): HIV Screen 4th Generation wRfx: NONREACTIVE

## 2016-04-28 MED ORDER — JEVITY 1.5 CAL/FIBER PO LIQD
1000.0000 mL | ORAL | Status: DC
  Administered 2016-04-28 – 2016-05-01 (×4): 1000 mL
  Filled 2016-04-28 (×12): qty 1000

## 2016-04-28 MED ORDER — SODIUM CHLORIDE 0.9 % IN NEBU
3.0000 mL | INHALATION_SOLUTION | Freq: Three times a day (TID) | RESPIRATORY_TRACT | Status: DC
  Administered 2016-04-28 – 2016-05-02 (×10): 3 mL via RESPIRATORY_TRACT
  Filled 2016-04-28 (×14): qty 3

## 2016-04-28 MED ORDER — ORAL CARE MOUTH RINSE
15.0000 mL | Freq: Two times a day (BID) | OROMUCOSAL | Status: DC
  Administered 2016-04-28 – 2016-05-02 (×8): 15 mL via OROMUCOSAL

## 2016-04-28 MED ORDER — LEVETIRACETAM 100 MG/ML PO SOLN
1500.0000 mg | Freq: Two times a day (BID) | ORAL | Status: DC
  Administered 2016-04-28 – 2016-05-02 (×8): 1500 mg
  Filled 2016-04-28 (×8): qty 15

## 2016-04-28 MED ORDER — IPRATROPIUM-ALBUTEROL 0.5-2.5 (3) MG/3ML IN SOLN
3.0000 mL | Freq: Four times a day (QID) | RESPIRATORY_TRACT | Status: DC
  Administered 2016-04-28 (×2): 3 mL via RESPIRATORY_TRACT
  Filled 2016-04-28 (×2): qty 3

## 2016-04-28 MED ORDER — SODIUM CHLORIDE 0.9 % IN NEBU
3.0000 mL | INHALATION_SOLUTION | Freq: Three times a day (TID) | RESPIRATORY_TRACT | Status: DC
  Filled 2016-04-28 (×4): qty 3

## 2016-04-28 MED ORDER — FREE WATER
240.0000 mL | Freq: Four times a day (QID) | Status: DC
  Administered 2016-04-28 – 2016-05-02 (×18): 240 mL

## 2016-04-28 MED ORDER — GUAIFENESIN-DM 100-10 MG/5ML PO SYRP
5.0000 mL | ORAL_SOLUTION | Freq: Three times a day (TID) | ORAL | Status: DC
  Administered 2016-04-28 – 2016-05-02 (×14): 5 mL
  Filled 2016-04-28 (×15): qty 5

## 2016-04-28 MED ORDER — IPRATROPIUM-ALBUTEROL 0.5-2.5 (3) MG/3ML IN SOLN: 3.0000 mL | RESPIRATORY_TRACT | Status: DC | PRN

## 2016-04-28 MED ORDER — IPRATROPIUM-ALBUTEROL 0.5-2.5 (3) MG/3ML IN SOLN
3.0000 mL | Freq: Three times a day (TID) | RESPIRATORY_TRACT | Status: DC
  Administered 2016-04-29 – 2016-05-02 (×11): 3 mL via RESPIRATORY_TRACT
  Filled 2016-04-28 (×12): qty 3

## 2016-04-28 NOTE — Progress Notes (Addendum)
Initial Nutrition Assessment  DOCUMENTATION CODES:   Not applicable  INTERVENTION:   Continue home TF regimen:   Jevity 1.5 70 ml/h from 7 am to 10 pm (15 hours per day)  Provides 1575 kcals, 67 gm protein, 798 ml free water daily  Free water flushes 240 ml every 4 hours from 7 am to 10 pm.  NUTRITION DIAGNOSIS:   Inadequate oral intake related to inability to eat, chronic illness as evidenced by NPO status.  GOAL:   Patient will meet greater than or equal to 90% of their needs  MONITOR:   TF tolerance, Labs, I & O's  REASON FOR ASSESSMENT:   Other (Comment) (Home TF)    ASSESSMENT:   41 y.o. 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 Patient presents with a rapid progression of increasing confusion, diminished interaction, and diminished urinary output,  fever, and vomiting.  Family not present during RD visit.  RN spoke with patient's family who reports usual TF regimen is Jevity 1.5 at 70 ml/h from 7 am - 10 pm via PEG with 240 ml free water flushes every 4 hours from 7 am - 10 pm. This provides 1575 kcals, 67 gm protein, 798 ml free water daily. Also receives Lactobacillus, MVI, vitamin C via PEG. Nutrition-focused physical exam not completed; muscle depletion expected with chronic musculoskeletal disease. Labs and medications reviewed. Patient tolerating TF well per discussion with RN.  Diet Order:  Diet NPO time specified  Skin:  Reviewed, no issues  Last BM:  PTA  Height:   Ht Readings from Last 1 Encounters:  04/27/16 6\' 4"  (1.93 m)    Weight:   Wt Readings from Last 1 Encounters:  04/27/16 177 lb (80.3 kg)    Ideal Body Weight:  82 kg (adjusted for quadriparesis)  BMI:  Body mass index is 21.55 kg/m.  Estimated Nutritional Needs:   Kcal:  1600-2000  Protein:  80-90 gm  Fluid:  1.6-2 L  EDUCATION NEEDS:    No education needs identified at this time  Molli Barrows, [place], [place], [place] Pager [phone] After Hours Pager [phone]

## 2016-04-28 NOTE — Progress Notes (Signed)
PROGRESS NOTE    Terry Palmer  WUJ:[id number] DOB: 08/09/1975 DOA: 04/27/2016 PCP: [name], MD    Brief Narrative:   Terry Palmer is a 41 y.o. 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 Patient presents with a rapid progression of increasing confusion, diminished interaction, and diminished urinary output since last night that is worse this morning area the patient additionally vomited and began to have a fever. His wife called EMS and brought the patient here. No palliating or provoking factors.  Emergency Department Course: Patient received fluid resuscitation 30 mL/kg. Started on broad-spectrum antibiotics with blood culture and urine culture sent. White count was 22.8 with a lactic acid of 4.7. All other blood work relatively normal. UA suggestive of UTI.    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 Likely secondary to UTI in patient with chronic indwelling Foley catheter. Patient also noted to have coarse breath sounds on examination. Chest x-ray on admission was negative. Patient has been pancultured and blood cultures and urine cultures pending. Change Foley catheter. Nasotracheal suctioning. Chest PT. Saline nebs. Will repeat chest x-ray tomorrow morning the patient has been hydrated. Continue empiric IV vancomycin and IV Zosyn. Follow.  #2 UTI in patient with chronic indwelling Foley catheter secondary to neurogenic bladder Patient with history of recurrent UTIs. Patient with indwelling suprapubic Foley catheter.  Urinalysis consistent with a UTI. Urine cultures pending. Change Foley catheter. Continue empiric IV Zosyn.  #3 lactic acidosis Likely secondary to problem #1 and 2. Repeat lactic acid level in the morning. Continue empiric IV antibiotics, IV fluids, supportive care.  #4 steroid-induced adrenal suppression Felt likely secondary to patient's sepsis. Continue stress dose hydrocortisone.  #5 dementia Continue Keppra.  #7 quadriparesis/MS Secondary to multiple sclerosis. Continue baclofen, dantrolene. Frequent turning. Air mattress overlay.  #8 G-tube feedings Continue Jevity.    DVT prophylaxis: Lovenox Code Status: Full Family Communication: No family at bedside. Disposition Plan: Likely back home once sepsis as resolved and patient is afebrile with normalization of white count.   Consultants:   None  Procedures:   Chest x-ray 04/27/2016  Antimicrobials:   IV Zosyn 04/27/2016  IV vancomycin 04/27/2016   Subjective: Patient minimally responsive eyes open and severe contractures.  Objective: Vitals:   04/28/16 0400 04/28/16 0600 04/28/16 0800 04/28/16 0940  BP: 126/76  114/76   Pulse: 90  82   Resp: 13  14   Temp: 100.2 F (37.9 C)  99.8 F (37.7 C)   TempSrc: Axillary  Axillary   SpO2: 97% 100% 93% 93%  Weight:      Height:        Intake/Output Summary (Last 24 hours) at 04/28/16 1021 Last data filed at 04/28/16 0800  Gross per 24 hour  Intake          5389.58 ml  Output             2350 ml  Net          3039.58 ml   Filed Weights   04/27/16 1011  Weight: 80.3 kg (177 lb)  Examination:  General exam: Eyes open. Minimally responsive. Respiratory system: Coarse rhonchorous breath sounds anterior lung fields. Poor respiratory effort.  Cardiovascular system: S1 & S2 heard, RRR. No JVD, murmurs, rubs, gallops or clicks. No pedal edema. Gastrointestinal system: Abdomen is nondistended, soft and nontender. No organomegaly or masses felt. Normal bowel  sounds heard. PEG tube in place. Suprapubic catheter in place. Central nervous system: Eyes open. Minimally responsive. Severe contractures. Extremities: Severe contractures. Skin: No rashes, lesions or ulcers Psychiatry: Unable to assess due to patient's mental status.    Data Reviewed: I have personally reviewed following labs and imaging studies  CBC:  Recent Labs Lab 04/27/16 1052 04/28/16 0417  WBC 22.8* 17.8*  NEUTROABS 20.7*  --   HGB 14.2 11.3*  HCT 43.3 34.6*  MCV 94.1 93.3  PLT 287 161   Basic Metabolic Panel:  Recent Labs Lab 04/27/16 1052 04/28/16 0417  NA 140 135  K 4.3 3.6  CL 100* 104  CO2 22 19*  GLUCOSE 118* 80  BUN 15 8  CREATININE 0.79 0.51*  CALCIUM 9.8 8.2*   GFR: Estimated Creatinine Clearance: 136.6 mL/min (A) (by C-G formula based on SCr of 0.51 mg/dL (L)). Liver Function Tests:  Recent Labs Lab 04/27/16 1052  AST 29  ALT 22  ALKPHOS 89  BILITOT 0.7  PROT 8.0  ALBUMIN 4.5    Recent Labs Lab 04/27/16 1052  LIPASE 16   No results for input(s): AMMONIA in the last 168 hours. Coagulation Profile:  Recent Labs Lab 04/27/16 1052  INR 1.05   Cardiac Enzymes: No results for input(s): CKTOTAL, CKMB, CKMBINDEX, TROPONINI in the last 168 hours. BNP (last 3 results) No results for input(s): PROBNP in the last 8760 hours. HbA1C: No results for input(s): HGBA1C in the last 72 hours. CBG: No results for input(s): GLUCAP in the last 168 hours. Lipid Profile: No results for input(s): CHOL, HDL, LDLCALC, TRIG, CHOLHDL, LDLDIRECT in the last 72 hours. Thyroid Function Tests: No results for input(s): TSH, T4TOTAL, FREET4, T3FREE, THYROIDAB in the last 72 hours. Anemia Panel: No results for input(s): VITAMINB12, FOLATE, FERRITIN, TIBC, IRON, RETICCTPCT in the last 72 hours. Sepsis Labs:  Recent Labs Lab 04/27/16 1110 04/27/16 1341 04/27/16 1836 04/27/16 2124  LATICACIDVEN 4.70* 2.92* 4.7* 2.9*    Recent Results (from the  past 240 hour(s))  MRSA PCR Screening     Status: None   Collection Time: 04/27/16  5:32 PM  Result Value Ref Range Status   MRSA by PCR NEGATIVE NEGATIVE Final    Comment:        The GeneXpert MRSA Assay (FDA approved for NASAL specimens only), is one component of a comprehensive MRSA colonization surveillance program. It is not intended to diagnose MRSA infection nor to guide or monitor treatment for MRSA infections.          Radiology Studies: Dg Chest Port 1 View  Result Date: 04/27/2016 CLINICAL DATA:  Sepsis. EXAM: PORTABLE CHEST 1 VIEW COMPARISON:  02/10/2016 and prior radiographs FINDINGS: The cardiomediastinal silhouette is unremarkable. This is a low volume film. There is no evidence of focal airspace disease, pulmonary edema, suspicious pulmonary nodule/mass, pleural effusion, or pneumothorax. No acute bony abnormalities are identified. IMPRESSION: No active disease. Electronically Signed   By: Margarette Canada M.D.   On: 04/27/2016 11:42        Scheduled Meds: . baclofen  20 mg Per Tube QID  . buPROPion  75 mg Per Tube TID  . dantrolene  200  mg Per Tube QHS  . enoxaparin (LOVENOX) injection  40 mg Subcutaneous Q24H  . feeding supplement (JEVITY 1.5 CAL/FIBER)  237 mL Per Tube QID  . hydrocortisone sod succinate (SOLU-CORTEF) inj  50 mg Intravenous Q8H  . ibuprofen  600 mg Per Tube Once  . ipratropium-albuterol  3 mL Nebulization BID  . levETIRAcetam  1,500 mg Oral BID  . loratadine  10 mg Oral Daily  . omeprazole  40 mg Per Tube Daily  . polyethylene glycol  17 g Per Tube QPM  . polyvinyl alcohol  1 drop Both Eyes BID  . senna  2 tablet Oral QHS  . sodium chloride flush  3 mL Intravenous Q12H   Continuous Infusions: . sodium chloride 1,000 mL (04/28/16 0500)  . piperacillin-tazobactam (ZOSYN)  IV Stopped (04/28/16 0904)  . vancomycin Stopped (04/28/16 0604)     LOS: 1 day    Time spent: 36 minutes    Terry Palmer,Adhrit, MD [facility] Pager  [phone]  If 7PM-7AM, please contact night-coverage [email or web address] Password [email or web address] 04/28/2016, 10:21 AM

## 2016-04-28 NOTE — Progress Notes (Signed)
Paged MD Grandville Silos to inform him that 4E does not have a nurse checked off on suprapubic catheter insertion, and the only RN's checked off on 4W were unable to leave to replace catheter for patient. Will let PM shift RN know to pass this along.

## 2016-04-29 ENCOUNTER — Inpatient Hospital Stay (HOSPITAL_COMMUNITY): Payer: Medicare HMO

## 2016-04-29 LAB — CBC WITH DIFFERENTIAL/PLATELET
Basophils Absolute: 0 10*3/uL (ref 0.0–0.1)
Basophils Relative: 0 %
Eosinophils Absolute: 0 10*3/uL (ref 0.0–0.7)
Eosinophils Relative: 0 %
HEMATOCRIT: 41 % (ref 39.0–52.0)
HEMOGLOBIN: 14 g/dL (ref 13.0–17.0)
LYMPHS ABS: 1.7 10*3/uL (ref 0.7–4.0)
LYMPHS PCT: 13 %
MCH: 31.3 pg (ref 26.0–34.0)
MCHC: 34.1 g/dL (ref 30.0–36.0)
MCV: 91.7 fL (ref 78.0–100.0)
Monocytes Absolute: 0.6 10*3/uL (ref 0.1–1.0)
Monocytes Relative: 5 %
NEUTROS ABS: 10.5 10*3/uL — AB (ref 1.7–7.7)
NEUTROS PCT: 82 %
Platelets: 177 10*3/uL (ref 150–400)
RBC: 4.47 MIL/uL (ref 4.22–5.81)
RDW: 13.1 % (ref 11.5–15.5)
WBC: 12.8 10*3/uL — ABNORMAL HIGH (ref 4.0–10.5)

## 2016-04-29 LAB — BASIC METABOLIC PANEL
Anion gap: 12 (ref 5–15)
BUN: 7 mg/dL (ref 6–20)
CO2: 24 mmol/L (ref 22–32)
Calcium: 8.7 mg/dL — ABNORMAL LOW (ref 8.9–10.3)
Chloride: 104 mmol/L (ref 101–111)
Creatinine, Ser: 0.48 mg/dL — ABNORMAL LOW (ref 0.61–1.24)
GFR calc Af Amer: >60 mL/min (ref >60)
GFR calc non Af Amer: >60 mL/min (ref >60)
GLUCOSE: 90 mg/dL (ref 65–99)
POTASSIUM: 3.3 mmol/L — AB (ref 3.5–5.1)
SODIUM: 140 mmol/L (ref 135–145)

## 2016-04-29 LAB — MAGNESIUM: Magnesium: 2.1 mg/dL (ref 1.7–2.4)

## 2016-04-29 LAB — LACTIC ACID, PLASMA: LACTIC ACID, VENOUS: 1.2 mmol/L (ref 0.5–1.9)

## 2016-04-29 LAB — VANCOMYCIN, TROUGH
VANCOMYCIN TR: 25 ug/mL — AB (ref 15–20)
Vancomycin Tr: 29 ug/mL (ref 15–20)

## 2016-04-29 MED ORDER — SODIUM CHLORIDE 0.9 % IV SOLN: 1000.0000 mL | INTRAVENOUS | Status: DC

## 2016-04-29 MED ORDER — SODIUM CHLORIDE 0.9 % IV SOLN
30.0000 meq | Freq: Once | INTRAVENOUS | Status: AC
  Administered 2016-04-29: 30 meq via INTRAVENOUS
  Filled 2016-04-29: qty 15

## 2016-04-29 MED ORDER — VANCOMYCIN HCL IN DEXTROSE 1-5 GM/200ML-% IV SOLN
1000.0000 mg | Freq: Two times a day (BID) | INTRAVENOUS | Status: DC
  Filled 2016-04-29: qty 200

## 2016-04-29 MED ORDER — VANCOMYCIN HCL IN DEXTROSE 1-5 GM/200ML-% IV SOLN
1000.0000 mg | Freq: Three times a day (TID) | INTRAVENOUS | Status: DC
  Administered 2016-04-29 – 2016-04-30 (×4): 1000 mg via INTRAVENOUS
  Filled 2016-04-29 (×5): qty 200

## 2016-04-29 MED ORDER — HYDROCORTISONE NA SUCCINATE PF 100 MG IJ SOLR
50.0000 mg | Freq: Two times a day (BID) | INTRAMUSCULAR | Status: DC
  Administered 2016-04-29 – 2016-05-01 (×4): 50 mg via INTRAVENOUS
  Filled 2016-04-29 (×4): qty 2

## 2016-04-29 MED ORDER — LORATADINE 10 MG PO TABS
10.0000 mg | ORAL_TABLET | Freq: Every day | ORAL | Status: DC
  Administered 2016-04-29 – 2016-05-02 (×4): 10 mg
  Filled 2016-04-29 (×4): qty 1

## 2016-04-29 NOTE — Progress Notes (Signed)
PROGRESS NOTE    Terry Palmer  KGM:[id number] DOB: 1975/03/30 DOA: 04/27/2016 PCP: [name], MD    Brief Narrative:   Terry Palmer is a 41 y.o. 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 Patient presents with a rapid progression of increasing confusion, diminished interaction, and diminished urinary output since last night that is worse this morning area the patient additionally vomited and began to have a fever. His wife called EMS and brought the patient here. No palliating or provoking factors.  Emergency Department Course: Patient received fluid resuscitation 30 mL/kg. Started on broad-spectrum antibiotics with blood culture and urine culture sent. White count was 22.8 with a lactic acid of 4.7. All other blood work relatively normal. UA suggestive of UTI.    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 Likely secondary to UTI in patient with chronic indwelling Foley catheter. Patient also noted to have coarse breath sounds on examination. Chest x-ray on admission was negative. Patient has been pancultured and blood cultures and urine cultures pending. Change Foley catheter. Nasotracheal suctioning. Chest PT. Saline nebs. Will repeat chest x-ray today, when the patient has been hydrated. Continue empiric IV vancomycin and IV Zosyn. Follow.  #2 UTI in patient with chronic indwelling Foley catheter secondary to neurogenic bladder Patient with history of recurrent UTIs. Patient with indwelling suprapubic Foley catheter.  Urinalysis consistent with a UTI. Urine cultures with multiple species present. Patient currently afebrile. WBC trending down. Change Foley catheter. Continue empiric IV Zosyn.  #3 lactic acidosis Likely secondary to problem #1 and 2. Repeat lactic acid level in the morning. Repeat chest x-ray. Continue empiric IV antibiotics, IV fluids, supportive care.  #4 steroid-induced adrenal suppression Felt likely secondary to patient's sepsis. Decrease stress dose hydrocortisone to q12h..  #5 dementia Continue Keppra.  #7 quadriparesis/MS Secondary to multiple sclerosis. Continue baclofen, dantrolene. Frequent turning. Air mattress overlay.  #8 G-tube feedings Continue Jevity.    DVT prophylaxis: Lovenox Code Status: Full Family Communication: No family at bedside. Disposition Plan: Likely back home once sepsis as resolved and patient is afebrile with normalization of white count.   Consultants:   None  Procedures:   Chest x-ray 04/27/2016  Antimicrobials:   IV Zosyn 04/27/2016  IV vancomycin 04/27/2016   Subjective: Patient minimally responsive eyes open and severe contractures. Some moaning.  Objective: Vitals:   04/28/16 2352 04/29/16 0333 04/29/16 0756 04/29/16 0849  BP: 115/76 122/74    Pulse: 69 71  74  Resp: 10 10  15   Temp: 98.9 F (37.2 C) 98.7 F (37.1 C) 98 F (36.7 C)   TempSrc: Oral Oral Axillary   SpO2: 100% 100% 100% 100%  Weight:      Height:        Intake/Output Summary (Last 24 hours) at 04/29/16 0924 Last data filed at 04/29/16 0829  Gross per 24 hour  Intake          4056.67 ml  Output             2375 ml  Net  1681.67 ml   Filed Weights   04/27/16 1011  Weight: 80.3 kg (177 lb)    Examination:  General exam: Eyes open. Minimally responsive. Respiratory system: Less Coarse rhonchorous breath sounds anterior lung fields. Poor-fair respiratory effort.  Cardiovascular system: S1 & S2 heard, RRR. No JVD, murmurs, rubs, gallops  or clicks. No pedal edema. Gastrointestinal system: Abdomen is nondistended, soft and nontender. No organomegaly or masses felt. Normal bowel sounds heard. PEG tube in place. Suprapubic catheter in place. Central nervous system: Eyes open. Minimally responsive. Severe contractures. Extremities: Severe contractures. Skin: No rashes, lesions or ulcers Psychiatry: Unable to assess due to patient's mental status.    Data Reviewed: I have personally reviewed following labs and imaging studies  CBC:  Recent Labs Lab 04/27/16 1052 04/28/16 0417 04/29/16 0302  WBC 22.8* 17.8* 12.8*  NEUTROABS 20.7*  --  10.5*  HGB 14.2 11.3* 14.0  HCT 43.3 34.6* 41.0  MCV 94.1 93.3 91.7  PLT 287 211 102   Basic Metabolic Panel:  Recent Labs Lab 04/27/16 1052 04/28/16 0417 04/29/16 0302  NA 140 135 140  K 4.3 3.6 3.3*  CL 100* 104 104  CO2 22 19* 24  GLUCOSE 118* 80 90  BUN 15 8 7   CREATININE 0.79 0.51* 0.48*  CALCIUM 9.8 8.2* 8.7*  MG  --   --  2.1   GFR: Estimated Creatinine Clearance: 136.6 mL/min (A) (by C-G formula based on SCr of 0.48 mg/dL (L)). Liver Function Tests:  Recent Labs Lab 04/27/16 1052  AST 29  ALT 22  ALKPHOS 89  BILITOT 0.7  PROT 8.0  ALBUMIN 4.5    Recent Labs Lab 04/27/16 1052  LIPASE 16   No results for input(s): AMMONIA in the last 168 hours. Coagulation Profile:  Recent Labs Lab 04/27/16 1052  INR 1.05   Cardiac Enzymes: No results for input(s): CKTOTAL, CKMB, CKMBINDEX, TROPONINI in the last 168 hours. BNP (last 3 results) No results for input(s): PROBNP in the last 8760 hours. HbA1C: No results for input(s): HGBA1C in the last 72 hours. CBG: No results for input(s): GLUCAP in the last 168 hours. Lipid Profile: No results for input(s): CHOL, HDL, LDLCALC, TRIG, CHOLHDL, LDLDIRECT in the last 72 hours. Thyroid Function Tests: No results for input(s): TSH, T4TOTAL, FREET4, T3FREE, THYROIDAB in the last 72 hours. Anemia Panel: No  results for input(s): VITAMINB12, FOLATE, FERRITIN, TIBC, IRON, RETICCTPCT in the last 72 hours. Sepsis Labs:  Recent Labs Lab 04/27/16 1110 04/27/16 1341 04/27/16 1836 04/27/16 2124  LATICACIDVEN 4.70* 2.92* 4.7* 2.9*    Recent Results (from the past 240 hour(s))  Culture, blood (Routine x 2)     Status: None (Preliminary result)   Collection Time: 04/27/16 10:35 AM  Result Value Ref Range Status   Specimen Description BLOOD RIGHT HAND  Final   Special Requests   Final    BOTTLES DRAWN AEROBIC AND ANAEROBIC Blood Culture results may not be optimal due to an inadequate volume of blood received in culture bottles   Culture NO GROWTH < 24 HOURS  Final   Report Status PENDING  Incomplete  Culture, blood (Routine x 2)     Status: None (Preliminary result)   Collection Time: 04/27/16 11:05 AM  Result Value Ref Range Status   Specimen Description BLOOD LEFT FOOT  Final   Special Requests IN PEDIATRIC BOTTLE Blood Culture adequate volume  Final   Culture NO GROWTH < 24 HOURS  Final   Report Status PENDING  Incomplete  Urine culture     Status: Abnormal   Collection Time: 04/27/16 11:16 AM  Result Value Ref Range Status   Specimen Description URINE, RANDOM  Final   Special Requests NONE  Final   Culture MULTIPLE SPECIES PRESENT, SUGGEST RECOLLECTION (A)  Final   Report Status 04/28/2016 FINAL  Final  MRSA PCR Screening     Status: None   Collection Time: 04/27/16  5:32 PM  Result Value Ref Range Status   MRSA by PCR NEGATIVE NEGATIVE Final    Comment:        The GeneXpert MRSA Assay (FDA approved for NASAL specimens only), is one component of a comprehensive MRSA colonization surveillance program. It is not intended to diagnose MRSA infection nor to guide or monitor treatment for MRSA infections.          Radiology Studies: Dg Chest Port 1 View  Result Date: 04/29/2016 CLINICAL DATA:  Patient with fever and pneumonia. EXAM: PORTABLE CHEST 1 VIEW COMPARISON:  Chest  radiograph 04/27/2016 FINDINGS: Low lung volumes. Monitoring leads overlie the patient. Stable cardiac and mediastinal contours. Heterogeneous opacities within the mid and lower lungs bilaterally. No pleural effusion or pneumothorax. IMPRESSION: Low lung volumes with mid and lower lung heterogeneous opacities bilaterally which may represent atelectasis or pneumonia. Electronically Signed   By: Lovey Newcomer M.D.   On: 04/29/2016 09:20   Dg Chest Port 1 View  Result Date: 04/27/2016 CLINICAL DATA:  Sepsis. EXAM: PORTABLE CHEST 1 VIEW COMPARISON:  02/10/2016 and prior radiographs FINDINGS: The cardiomediastinal silhouette is unremarkable. This is a low volume film. There is no evidence of focal airspace disease, pulmonary edema, suspicious pulmonary nodule/mass, pleural effusion, or pneumothorax. No acute bony abnormalities are identified. IMPRESSION: No active disease. Electronically Signed   By: Margarette Canada M.D.   On: 04/27/2016 11:42        Scheduled Meds: . baclofen  20 mg Per Tube QID  . buPROPion  75 mg Per Tube TID  . dantrolene  200 mg Per Tube QHS  . enoxaparin (LOVENOX) injection  40 mg Subcutaneous Q24H  . free water  240 mL Per Tube QID  . guaiFENesin-dextromethorphan  5 mL Per Tube TID  . hydrocortisone sod succinate (SOLU-CORTEF) inj  50 mg Intravenous Q8H  . ibuprofen  600 mg Per Tube Once  . ipratropium-albuterol  3 mL Nebulization TID  . levETIRAcetam  1,500 mg Per Tube BID  . loratadine  10 mg Per Tube Daily  . mouth rinse  15 mL Mouth Rinse BID  . omeprazole  40 mg Per Tube Daily  . polyethylene glycol  17 g Per Tube QPM  . polyvinyl alcohol  1 drop Both Eyes BID  . senna  2 tablet Oral QHS  . sodium chloride  3 mL Nebulization TID  . sodium chloride flush  3 mL Intravenous Q12H   Continuous Infusions: . sodium chloride 1,000 mL (04/29/16 0818)  . feeding supplement (JEVITY 1.5 CAL/FIBER) Stopped (04/29/16 0750)  . piperacillin-tazobactam (ZOSYN)  IV 3.375 g (04/29/16  0557)  . potassium chloride (KCL MULTIRUN) 30 mEq in 265 mL IVPB    . vancomycin Stopped (04/29/16 0819)     LOS: 2 days    Time spent: 20 minutes    Osric Klopf,[place], MD [facility] Pager [phone]  If 7PM-7AM, please contact night-coverage [email or web address] Password [facility] 04/29/2016, 9:24 AM

## 2016-04-29 NOTE — Progress Notes (Signed)
Pharmacy Antibiotic Note  Terry Palmer is a 41 y.o. male admitted on 04/27/2016 with sepsis and UTI.    Pharmacy has been consulted for Vancomycin and Zosyn dosing - pt on Day #3 of abx. Hx multiple sclerosis, quadriplegic, recurrent UTIs. SCr down to 0.48 (likely not reflective of true renal function in pt with quadriplegia - noted pt baseline ~0.6).   Vancomycin trough 29 mcg/ml (SUPRAtherapeutic) however level was drawn ~3 hours early in q8h dosing interval (so only ~5 hours post last dose so not accurate) - true trough probably within goal range.   Plan: Continue Vancomycin 1gm IV q8h. Target troughs 15-20 mcg/ml. Continue Zosyn 3.375 gm IV q8hrs (each over 4 hrs) Follow renal function, culture data, progress. Recheck Vanc trough today at 1330  Height: 6\' 4"  (193 cm) Weight: 177 lb (80.3 kg) IBW/kg (Calculated) : 86.8  Temp (24hrs), Avg:98.8 F (37.1 C), Min:97.6 F (36.4 C), Max:99.8 F (37.7 C)   Recent Labs Lab 04/27/16 1052 04/27/16 1110 04/27/16 1341 04/27/16 1836 04/27/16 2124 04/28/16 0417 04/29/16 0302  WBC 22.8*  --   --   --   --  17.8* 12.8*  CREATININE 0.79  --   --   --   --  0.51* 0.48*  LATICACIDVEN  --  4.70* 2.92* 4.7* 2.9*  --   --   VANCOTROUGH  --   --   --   --   --   --  29*    Estimated Creatinine Clearance: 136.6 mL/min (A) (by C-G formula based on SCr of 0.48 mg/dL (L)).    No Known Allergies  Antimicrobials this admission:  Vancomycin 4/20>>  Zosyn 4/20>>  Dose adjustments this admission:  n/a  Microbiology results:   4/20 blood x 2 - ngtd- 1 bottle had inadequate blood    4/20 urine - multiple species    4/20 MRSA PCR - negative      Thank you for allowing pharmacy to be a part of this patient's care.  Sherlon Handing, PharmD, BCPS Clinical pharmacist, pager [phone] 04/29/2016 5:11 AM

## 2016-04-29 NOTE — Progress Notes (Signed)
CRITICAL VALUE ALERT  Critical value received:  Vancomycin trough 25  Date of notification:  04/29/16  Time of notification:  1300  Critical value read back:Yes  Nurse who received alert:  Adelene Idler, RN  MD notified (1st page): Dr. Cline Cools  Time of first page:  1304  MD notified (2nd page):  Time of second page:  Responding MD:  Dr. Cline Cools  Time MD responded: [phone]

## 2016-04-29 NOTE — Plan of Care (Signed)
Problem: Skin Integrity: Goal: Risk for impaired skin integrity will decrease Outcome: Progressing q2 hour turns heels elevated barrier cream to sacrum

## 2016-04-29 NOTE — Progress Notes (Signed)
Return call from Dr Grandville Silos to give Vanc dose at 1400. Will discontinue 4/23 if blood cultures are negative. Consuelo Pandy RN

## 2016-04-29 NOTE — Progress Notes (Signed)
Pharmacy Antibiotic Note  Terry Palmer is a 41 y.o. male admitted on 04/27/2016 with sepsis and UTI.    Pharmacy has been consulted for Vancomycin and Zosyn dosing - pt on Day #3 of abx. Hx multiple sclerosis, quadriplegic, recurrent UTIs. SCr down to 0.48 (likely not reflective of true renal function in pt with quadriplegia - noted pt baseline ~0.6).   Vancomycin trough 25 mcg/ml (SUPRAtherapeutic) however level was drawn ~2 hours early in q8h dosing interval (so only ~6 hours post last dose so not accurate) - true trough calculated to be close to 20 (within goal range).   Plan: Continue Vancomycin 1gm IV q8h. Target troughs 15-20 mcg/ml. Continue Zosyn 3.375 gm IV q8hrs (each over 4 hrs) Follow renal function, culture data, progress. Per Dr. Grandville Silos likely D/C Vanc once blood cultures clear another day  Height: 6\' 4"  (193 cm) Weight: 177 lb (80.3 kg) IBW/kg (Calculated) : 86.8  Temp (24hrs), Avg:98.4 F (36.9 C), Min:97.6 F (36.4 C), Max:98.9 F (37.2 C)   Recent Labs Lab 04/27/16 1052 04/27/16 1110 04/27/16 1341 04/27/16 1836 04/27/16 2124 04/28/16 0417 04/29/16 0302 04/29/16 0846 04/29/16 1202  WBC 22.8*  --   --   --   --  17.8* 12.8*  --   --   CREATININE 0.79  --   --   --   --  0.51* 0.48*  --   --   LATICACIDVEN  --  4.70* 2.92* 4.7* 2.9*  --   --  1.2  --   VANCOTROUGH  --   --   --   --   --   --  29*  --  25*    Estimated Creatinine Clearance: 136.6 mL/min (A) (by C-G formula based on SCr of 0.48 mg/dL (L)).    No Known Allergies  Antimicrobials this admission:  Vancomycin 4/20>>  Zosyn 4/20>>  Dose adjustments this admission:  n/a  Microbiology results:   4/20 blood x 2 - ngtd- 1 bottle had inadequate blood    4/20 urine - multiple species    4/20 MRSA PCR - negative      Thank you for allowing pharmacy to be a part of this patient's care.  Uvaldo Bristle. PharmD PGY1 Pharmacy Resident  pager [phone] 04/29/2016 1:13 PM

## 2016-04-29 NOTE — Progress Notes (Signed)
Attempting to replace 22Fr suprapubic catheter per MD request. [facility] does not stock 22Fr catheters only 22Fr coudes. Materials sent up 22Fr coude.  Contacted urology at 1130, called back at 1131 to say urology does not insert suprapubic caths, should contact [place] [facility] to insert.  Contacted [facility] and spoke with charge nurse regarding insertion. They are qualified to perform however they do not have staffing to send over and do not carry 22Fr caths. Checked on [facility] and they do not carry either. Paged Dr. Grandville Silos who returned page, instructed to contact materials again and if they are not in stock, should attempt to get one from [facility].  Notified materials, waiting to see if they can send.

## 2016-04-29 NOTE — Progress Notes (Signed)
Still waiting for 22Fr catheter to replace SP catheter, wife arrived and said she has 22Fr catheters at home and will bring one tomorrow for [place] to use.

## 2016-04-29 NOTE — Progress Notes (Signed)
No family at bedside, unable to complete admission, pt is nonverbal . Consuelo Pandy RN

## 2016-04-30 LAB — CBC WITH DIFFERENTIAL/PLATELET
Basophils Absolute: 0 10*3/uL (ref 0.0–0.1)
Basophils Relative: 0 %
EOS ABS: 0.1 10*3/uL (ref 0.0–0.7)
Eosinophils Relative: 1 %
HEMATOCRIT: 36.5 % — AB (ref 39.0–52.0)
Hemoglobin: 12.1 g/dL — ABNORMAL LOW (ref 13.0–17.0)
LYMPHS ABS: 2.9 10*3/uL (ref 0.7–4.0)
Lymphocytes Relative: 22 %
MCH: 30.3 pg (ref 26.0–34.0)
MCHC: 33.2 g/dL (ref 30.0–36.0)
MCV: 91.5 fL (ref 78.0–100.0)
MONOS PCT: 7 %
Monocytes Absolute: 0.9 10*3/uL (ref 0.1–1.0)
NEUTROS ABS: 9.3 10*3/uL — AB (ref 1.7–7.7)
NEUTROS PCT: 70 %
Platelets: 206 10*3/uL (ref 150–400)
RBC: 3.99 MIL/uL — ABNORMAL LOW (ref 4.22–5.81)
RDW: 12.9 % (ref 11.5–15.5)
WBC: 13.2 10*3/uL — ABNORMAL HIGH (ref 4.0–10.5)

## 2016-04-30 LAB — BASIC METABOLIC PANEL
Anion gap: 10 (ref 5–15)
BUN: 6 mg/dL (ref 6–20)
CHLORIDE: 107 mmol/L (ref 101–111)
CO2: 24 mmol/L (ref 22–32)
CREATININE: 0.52 mg/dL — AB (ref 0.61–1.24)
Calcium: 8.6 mg/dL — ABNORMAL LOW (ref 8.9–10.3)
GFR calc Af Amer: >60 mL/min (ref >60)
GFR calc non Af Amer: >60 mL/min (ref >60)
Glucose, Bld: 81 mg/dL (ref 65–99)
Potassium: 2.9 mmol/L — ABNORMAL LOW (ref 3.5–5.1)
Sodium: 141 mmol/L (ref 135–145)

## 2016-04-30 MED ORDER — SODIUM CHLORIDE 0.9 % IV SOLN
30.0000 meq | INTRAVENOUS | Status: AC
  Administered 2016-04-30 (×2): 30 meq via INTRAVENOUS
  Filled 2016-04-30 (×2): qty 15

## 2016-04-30 MED ORDER — SODIUM CHLORIDE 0.9 % IV SOLN
1000.0000 mL | INTRAVENOUS | Status: DC
  Administered 2016-05-01: 1000 mL via INTRAVENOUS

## 2016-04-30 NOTE — Care Management Note (Signed)
Case Management Note  Patient Details  Name: Terry Palmer MRN: [id number] Date of Birth: 1975/06/23  Subjective/Objective:    Presents with  Sepsis, also concerning for  pna , quadriparesis, sacral decbitus, g tube feedings, neurogenic bladder.                 Action/Plan: NCM will follow for dc needs.  Expected Discharge Date:                  Expected Discharge Plan:     In-House Referral:     Discharge planning Services  CM Consult  Post Acute Care Choice:    Choice offered to:     DME Arranged:    DME Agency:     HH Arranged:    HH Agency:     Status of Service:  In process, will continue to follow  If discussed at Long Length of Stay Meetings, dates discussed:    Additional Comments:  Zenon Mayo, RN 04/30/2016, 4:48 PM

## 2016-04-30 NOTE — Progress Notes (Signed)
Report called pt to transfer to 5W17 via bed with belongings, will notify wife.

## 2016-04-30 NOTE — Progress Notes (Signed)
PROGRESS NOTE    DASHIEL Palmer  BWG:[id number] DOB: 1975-07-23 DOA: 04/27/2016 PCP: [name], MD    Brief Narrative:   Terry Palmer is a 41 y.o. 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 Patient presents with a rapid progression of increasing confusion, diminished interaction, and diminished urinary output since last night that is worse this morning area the patient additionally vomited and began to have a fever. His wife called EMS and brought the patient here. No palliating or provoking factors.  Emergency Department Course: Patient received fluid resuscitation 30 mL/kg. Started on broad-spectrum antibiotics with blood culture and urine culture sent. White count was 22.8 with a lactic acid of 4.7. All other blood work relatively normal. UA suggestive of UTI.    Patient also clinically concerning for pneumonia in the patient with MS and severe contractures and minimally responsive. Patient placed empirically on IV antibiotics.    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 Likely secondary to UTI in patient with chronic indwelling Foley catheter. Patient also noted to have coarse breath sounds on examination and consent for probable pneumonia. Chest x-ray on admission was negative. Repeat chest x-ray with low lung volumes and made and lower lung heterogeneous opacities bilaterally may represent atelectasis versus pneumonia. Patient did have some respiratory symptoms during the hospitalization on  presentation. Patient has been pancultured and blood cultures pending. Urine cultures with multiple species present. Change Foley catheter. Nasotracheal suctioning. Chest PT. Saline nebs. Continue scheduled nebulizers, Robitussin. Discontinue IV vancomycin. Continue IV Zosyn.   #2 UTI in patient with chronic indwelling Foley catheter secondary to neurogenic bladder Patient with history of recurrent UTIs. Patient with indwelling suprapubic Foley catheter. Urinalysis consistent with a UTI. Urine cultures with multiple species present. Patient currently afebrile. WBC trending down. Change Foley catheter. Continue empiric IV Zosyn.  #3 lactic acidosis Likely secondary to problem #1 and 2. Repeat lactic acid level improved currently at 1.2. Repeat chest x-ray 04/29/2016 with low lung volumes with mid and lower lung heterogeneous opacities bilaterally may represent atelectasis versus pneumonia. Continue empiric IV antibiotics, IV fluids, supportive care, nebulizer treatments, chest PT.   #4 steroid-induced adrenal suppression Felt likely secondary to patient's sepsis. Decreased stress dose hydrocortisone to q12h..  #5 dementia Continue Keppra.  #7 quadriparesis/MS Secondary to multiple sclerosis. Continue baclofen, dantrolene. Frequent turning. Air mattress overlay.  #8 G-tube feedings Continue Jevity. Free water flushes.    DVT prophylaxis: Lovenox Code Status: Full Family Communication: No family at bedside. Disposition Plan: Likely back home once sepsis as resolved and patient is afebrile with normalization of white count.   Consultants:   None  Procedures:   Chest x-ray 04/27/2016, 04/29/2016  Antimicrobials:   IV Zosyn 04/27/2016  IV vancomycin 04/27/2016>>>> 04/30/2016   Subjective: Patient minimally responsive eyes open and blinking to some questions, severe contractures.   Objective: Vitals:   04/29/16 2152 04/29/16 2300 04/30/16 0305 04/30/16 0800  BP:  (!) 136/97  (!) 129/94 118/83  Pulse:  67 66 70  Resp:  15 13 15   Temp:  98.5 F (36.9  C) 98 F (36.7 C) 99 F (37.2 C)  TempSrc:  Axillary Axillary Axillary  SpO2: 97% 100% 99% 100%  Weight:      Height:        Intake/Output Summary (Last 24 hours) at 04/30/16 0948 Last data filed at 04/30/16 0800  Gross per 24 hour  Intake             3940 ml  Output             3600 ml  Net              340 ml   Filed Weights   04/27/16 1011  Weight: 80.3 kg (177 lb)    Examination:  General exam: Eyes open. Minimally responsive.Blinks eyes. Respiratory system: Less Coarse rhonchorous breath sounds anterior lung fields. Poor-fair respiratory effort.  Cardiovascular system: S1 & S2 heard, RRR. No JVD, murmurs, rubs, gallops or clicks. No pedal edema. Gastrointestinal system: Abdomen is nondistended, soft and nontender. No organomegaly or masses felt. Normal bowel sounds heard. PEG tube in place. Suprapubic catheter in place. Central nervous system: Eyes open. Minimally responsive. Severe contractures. Extremities: Severe contractures. Skin: No rashes, lesions or ulcers Psychiatry: Unable to assess due to patient's mental status.    Data Reviewed: I have personally reviewed following labs and imaging studies  CBC:  Recent Labs Lab 04/27/16 1052 04/28/16 0417 04/29/16 0302 04/30/16 0457  WBC 22.8* 17.8* 12.8* 13.2*  NEUTROABS 20.7*  --  10.5* 9.3*  HGB 14.2 11.3* 14.0 12.1*  HCT 43.3 34.6* 41.0 36.5*  MCV 94.1 93.3 91.7 91.5  PLT 287 211 177 144   Basic Metabolic Panel:  Recent Labs Lab 04/27/16 1052 04/28/16 0417 04/29/16 0302 04/30/16 0457  NA 140 135 140 141  K 4.3 3.6 3.3* 2.9*  CL 100* 104 104 107  CO2 22 19* 24 24  GLUCOSE 118* 80 90 81  BUN 15 8 7 6   CREATININE 0.79 0.51* 0.48* 0.52*  CALCIUM 9.8 8.2* 8.7* 8.6*  MG  --   --  2.1  --    GFR: Estimated Creatinine Clearance: 136.6 mL/min (A) (by C-G formula based on SCr of 0.52 mg/dL (L)). Liver Function  Tests:  Recent Labs Lab 04/27/16 1052  AST 29  ALT 22  ALKPHOS 89  BILITOT 0.7  PROT 8.0  ALBUMIN 4.5    Recent Labs Lab 04/27/16 1052  LIPASE 16   No results for input(s): AMMONIA in the last 168 hours. Coagulation Profile:  Recent Labs Lab 04/27/16 1052  INR 1.05   Cardiac Enzymes: No results for input(s): CKTOTAL, CKMB, CKMBINDEX, TROPONINI in the last 168 hours. BNP (last 3 results) No results for input(s): PROBNP in the last 8760 hours. HbA1C: No results for input(s): HGBA1C in the last 72 hours. CBG: No results for input(s): GLUCAP in the last 168 hours. Lipid Profile: No results for input(s): CHOL, HDL, LDLCALC, TRIG, CHOLHDL, LDLDIRECT in the last 72 hours. Thyroid Function Tests: No results for input(s): TSH, T4TOTAL, FREET4, T3FREE, THYROIDAB in the last 72 hours. Anemia Panel: No results for input(s): VITAMINB12, FOLATE, FERRITIN, TIBC, IRON, RETICCTPCT in the last 72 hours. Sepsis Labs:  Recent Labs Lab 04/27/16 1341 04/27/16 1836 04/27/16 2124 04/29/16 0846  LATICACIDVEN 2.92* 4.7* 2.9* 1.2    Recent Results (from the past 240 hour(s))  Culture, blood (Routine x 2)     Status: None (Preliminary result)   Collection Time: 04/27/16 10:35 AM  Result Value Ref Range Status  Specimen Description BLOOD RIGHT HAND  Final   Special Requests   Final    BOTTLES DRAWN AEROBIC AND ANAEROBIC Blood Culture results may not be optimal due to an inadequate volume of blood received in culture bottles   Culture NO GROWTH 2 DAYS  Final   Report Status PENDING  Incomplete  Culture, blood (Routine x 2)     Status: None (Preliminary result)   Collection Time: 04/27/16 11:05 AM  Result Value Ref Range Status   Specimen Description BLOOD LEFT FOOT  Final   Special Requests IN PEDIATRIC BOTTLE Blood Culture adequate volume  Final   Culture NO GROWTH 2 DAYS  Final   Report Status PENDING  Incomplete  Urine culture     Status: Abnormal   Collection Time: 04/27/16  11:16 AM  Result Value Ref Range Status   Specimen Description URINE, RANDOM  Final   Special Requests NONE  Final   Culture MULTIPLE SPECIES PRESENT, SUGGEST RECOLLECTION (A)  Final   Report Status 04/28/2016 FINAL  Final  MRSA PCR Screening     Status: None   Collection Time: 04/27/16  5:32 PM  Result Value Ref Range Status   MRSA by PCR NEGATIVE NEGATIVE Final    Comment:        The GeneXpert MRSA Assay (FDA approved for NASAL specimens only), is one component of a comprehensive MRSA colonization surveillance program. It is not intended to diagnose MRSA infection nor to guide or monitor treatment for MRSA infections.          Radiology Studies: Dg Chest Port 1 View  Result Date: 04/29/2016 CLINICAL DATA:  Patient with fever and pneumonia. EXAM: PORTABLE CHEST 1 VIEW COMPARISON:  Chest radiograph 04/27/2016 FINDINGS: Low lung volumes. Monitoring leads overlie the patient. Stable cardiac and mediastinal contours. Heterogeneous opacities within the mid and lower lungs bilaterally. No pleural effusion or pneumothorax. IMPRESSION: Low lung volumes with mid and lower lung heterogeneous opacities bilaterally which may represent atelectasis or pneumonia. Electronically Signed   By: Lovey Newcomer M.D.   On: 04/29/2016 09:20        Scheduled Meds: . baclofen  20 mg Per Tube QID  . buPROPion  75 mg Per Tube TID  . dantrolene  200 mg Per Tube QHS  . enoxaparin (LOVENOX) injection  40 mg Subcutaneous Q24H  . free water  240 mL Per Tube QID  . guaiFENesin-dextromethorphan  5 mL Per Tube TID  . hydrocortisone sod succinate (SOLU-CORTEF) inj  50 mg Intravenous Q12H  . ibuprofen  600 mg Per Tube Once  . ipratropium-albuterol  3 mL Nebulization TID  . levETIRAcetam  1,500 mg Per Tube BID  . loratadine  10 mg Per Tube Daily  . mouth rinse  15 mL Mouth Rinse BID  . omeprazole  40 mg Per Tube Daily  . polyethylene glycol  17 g Per Tube QPM  . polyvinyl alcohol  1 drop Both Eyes BID   . senna  2 tablet Oral QHS  . sodium chloride  3 mL Nebulization TID  . sodium chloride flush  3 mL Intravenous Q12H   Continuous Infusions: . sodium chloride 1,000 mL (04/30/16 0600)  . feeding supplement (JEVITY 1.5 CAL/FIBER) Stopped (04/29/16 2300)  . piperacillin-tazobactam (ZOSYN)  IV 3.375 g (04/30/16 6962)  . potassium chloride (KCL MULTIRUN) 30 mEq in 265 mL IVPB 30 mEq (04/30/16 0854)     LOS: 3 days    Time spent: 35 minutes    Jamesen Stahnke,[place], MD  [facility] Pager [phone]  If 7PM-7AM, please contact night-coverage [email or web address] Password [facility] 04/30/2016, 9:48 AM

## 2016-05-01 LAB — BASIC METABOLIC PANEL
Anion gap: 10 (ref 5–15)
BUN: 6 mg/dL (ref 6–20)
CALCIUM: 9.1 mg/dL (ref 8.9–10.3)
CO2: 26 mmol/L (ref 22–32)
CREATININE: 0.62 mg/dL (ref 0.61–1.24)
Chloride: 109 mmol/L (ref 101–111)
GFR calc Af Amer: >60 mL/min (ref >60)
GFR calc non Af Amer: >60 mL/min (ref >60)
Glucose, Bld: 98 mg/dL (ref 65–99)
Potassium: 3.3 mmol/L — ABNORMAL LOW (ref 3.5–5.1)
Sodium: 145 mmol/L (ref 135–145)

## 2016-05-01 LAB — CBC
HEMATOCRIT: 39.4 % (ref 39.0–52.0)
Hemoglobin: 12.8 g/dL — ABNORMAL LOW (ref 13.0–17.0)
MCH: 30 pg (ref 26.0–34.0)
MCHC: 32.5 g/dL (ref 30.0–36.0)
MCV: 92.5 fL (ref 78.0–100.0)
Platelets: 237 10*3/uL (ref 150–400)
RBC: 4.26 MIL/uL (ref 4.22–5.81)
RDW: 13.2 % (ref 11.5–15.5)
WBC: 9.9 10*3/uL (ref 4.0–10.5)

## 2016-05-01 MED ORDER — HYDROCORTISONE NA SUCCINATE PF 100 MG IJ SOLR: 50.0000 mg | Freq: Every day | INTRAMUSCULAR | Status: DC

## 2016-05-01 MED ORDER — PREDNISONE 10 MG PO TABS
30.0000 mg | ORAL_TABLET | Freq: Every day | ORAL | Status: DC
  Administered 2016-05-02: 30 mg
  Filled 2016-05-01: qty 3

## 2016-05-01 MED ORDER — SODIUM CHLORIDE 0.9 % IV SOLN
30.0000 meq | Freq: Once | INTRAVENOUS | Status: AC
  Administered 2016-05-01: 30 meq via INTRAVENOUS
  Filled 2016-05-01: qty 15

## 2016-05-01 NOTE — Progress Notes (Signed)
PROGRESS NOTE    Terry Palmer  LPF:[id number] DOB: Dec 09, 1975 DOA: 04/27/2016 PCP: [name], MD    Brief Narrative:   Terry Palmer is a 41 y.o. 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 Patient presents with a rapid progression of increasing confusion, diminished interaction, and diminished urinary output since last night that is worse this morning area the patient additionally vomited and began to have a fever. His wife called EMS and brought the patient here. No palliating or provoking factors.  Emergency Department Course: Patient received fluid resuscitation 30 mL/kg. Started on broad-spectrum antibiotics with blood culture and urine culture sent. White count was 22.8 with a lactic acid of 4.7. All other blood work relatively normal. UA suggestive of UTI.    Patient also clinically concerning for pneumonia in the patient with MS and severe contractures and minimally responsive. Patient placed empirically on IV antibiotics.    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 Likely secondary to UTI in patient with chronic indwelling Foley catheter. Patient also noted to have coarse breath sounds on examination and concern for probable pneumonia. Chest x-ray on admission was negative. Repeat chest x-ray with low lung volumes and made and lower lung heterogeneous opacities bilaterally may represent atelectasis versus pneumonia. Patient did have some respiratory symptoms during the hospitalization on  presentation. Patient has been pancultured and blood cultures pending. Urine cultures with multiple species present. Changed Foley catheter. Nasotracheal suctioning. Chest PT. Saline nebs. Continue scheduled nebulizers, Robitussin. Discontinued IV vancomycin. Continue IV Zosyn. Will likely transition to Augmentin tomorrow.   #2 UTI in patient with chronic indwelling Foley catheter secondary to neurogenic bladder Patient with history of recurrent UTIs. Patient with indwelling suprapubic Foley catheter. Urinalysis consistent with a UTI. Urine cultures with multiple species present. Patient currently afebrile. WBC trending down. Foley catheter finally changed on 04/30/2016. Continue empiric IV Zosyn and likely transition to Augmentin tomorrow.  #3 lactic acidosis Likely secondary to problem #1 and 2. Repeat lactic acid level improved currently at 1.2. Repeat chest x-ray 04/29/2016 with low lung volumes with mid and lower lung heterogeneous opacities bilaterally may represent atelectasis versus pneumonia. Patient was maintained on IV antibiotics, IV fluids, supportive care, nebulizer treatments, chest PT.   #4 steroid-induced adrenal suppression Felt likely secondary to patient's sepsis. Decreased stress dose hydrocortisone to daily and transition to oral prednisone taper in the next 24-48 hours.  #5 dementia Continue Keppra.  #7 quadriparesis/MS Secondary to multiple sclerosis. Continue baclofen, dantrolene. Frequent turning. Air mattress overlay.  #8 G-tube feedings Continue Jevity. Free water flushes.    DVT prophylaxis: Lovenox Code Status: Full Family Communication: No family at bedside. Disposition Plan: Likely back home once sepsis as resolved and patient is afebrile with normalization of white count, hopefully tomorrow.   Consultants:   None  Procedures:   Chest x-ray 04/27/2016, 04/29/2016  Antimicrobials:   IV Zosyn 04/27/2016  IV vancomycin 04/27/2016>>>>  04/30/2016   Subjective: Patient minimally responsive eyes open and blinking to some questions. Family at bedside.  Objective: Vitals:   04/30/16 1938 04/30/16 2156  05/01/16 0543 05/01/16 0745  BP: 128/78  119/74   Pulse: 70 77 72   Resp: 18 18 17    Temp: 98.3 F (36.8 C)  97.7 F (36.5 C)   TempSrc:   Oral   SpO2: 100% 99% 100% 97%  Weight:      Height:        Intake/Output Summary (Last 24 hours) at 05/01/16 1257 Last data filed at 05/01/16 0659  Gross per 24 hour  Intake          2986.25 ml  Output             3600 ml  Net          -613.75 ml   Filed Weights   04/27/16 1011  Weight: 80.3 kg (177 lb)    Examination:  General exam: Eyes open. Minimally responsive.Blinks eyes. Respiratory system: CTAB anterior lung fields. Poor-fair respiratory effort.  Cardiovascular system: S1 & S2 heard, RRR. No JVD, murmurs, rubs, gallops or clicks. No pedal edema. Gastrointestinal system: Abdomen is nondistended, soft and nontender. No organomegaly or masses felt. Normal bowel sounds heard. PEG tube in place. Suprapubic catheter in place. Central nervous system: Eyes open. Seems to be tracking. Blinking eyes. Severe contractures. Extremities: Severe contractures. Skin: No rashes, lesions or ulcers Psychiatry: Unable to assess due to patient's mental status.    Data Reviewed: I have personally reviewed following labs and imaging studies  CBC:  Recent Labs Lab 04/27/16 1052 04/28/16 0417 04/29/16 0302 04/30/16 0457 05/01/16 0357  WBC 22.8* 17.8* 12.8* 13.2* 9.9  NEUTROABS 20.7*  --  10.5* 9.3*  --   HGB 14.2 11.3* 14.0 12.1* 12.8*  HCT 43.3 34.6* 41.0 36.5* 39.4  MCV 94.1 93.3 91.7 91.5 92.5  PLT 287 211 177 206 144   Basic Metabolic Panel:  Recent Labs Lab 04/27/16 1052 04/28/16 0417 04/29/16 0302 04/30/16 0457 05/01/16 0357  NA 140 135 140 141 145  K 4.3 3.6 3.3* 2.9* 3.3*  CL 100* 104 104 107 109  CO2 22 19* 24 24 26   GLUCOSE 118* 80 90 81 98  BUN 15  8 7 6 6   CREATININE 0.79 0.51* 0.48* 0.52* 0.62  CALCIUM 9.8 8.2* 8.7* 8.6* 9.1  MG  --   --  2.1  --   --    GFR: Estimated Creatinine Clearance: 136.6 mL/min (by C-G formula based on SCr of 0.62 mg/dL). Liver Function Tests:  Recent Labs Lab 04/27/16 1052  AST 29  ALT 22  ALKPHOS 89  BILITOT 0.7  PROT 8.0  ALBUMIN 4.5    Recent Labs Lab 04/27/16 1052  LIPASE 16   No results for input(s): AMMONIA in the last 168 hours. Coagulation Profile:  Recent Labs Lab 04/27/16 1052  INR 1.05   Cardiac Enzymes: No results for input(s): CKTOTAL, CKMB, CKMBINDEX, TROPONINI in the last 168 hours. BNP (last 3 results) No results for input(s): PROBNP in the last 8760 hours. HbA1C: No results for input(s): HGBA1C in the last 72 hours. CBG: No results for input(s): GLUCAP in the last 168 hours. Lipid Profile: No results for input(s): CHOL, HDL, LDLCALC, TRIG, CHOLHDL, LDLDIRECT in the last 72 hours. Thyroid Function Tests: No results for input(s): TSH, T4TOTAL, FREET4, T3FREE, THYROIDAB in the last 72 hours. Anemia Panel: No results for input(s): VITAMINB12, FOLATE, FERRITIN, TIBC, IRON, RETICCTPCT in the last 72 hours. Sepsis Labs:  Recent Labs Lab 04/27/16 1341 04/27/16 1836 04/27/16 2124 04/29/16 0846  LATICACIDVEN 2.92* 4.7* 2.9* 1.2  Recent Results (from the past 240 hour(s))  Culture, blood (Routine x 2)     Status: None (Preliminary result)   Collection Time: 04/27/16 10:35 AM  Result Value Ref Range Status   Specimen Description BLOOD RIGHT HAND  Final   Special Requests   Final    BOTTLES DRAWN AEROBIC AND ANAEROBIC Blood Culture results may not be optimal due to an inadequate volume of blood received in culture bottles   Culture NO GROWTH 4 DAYS  Final   Report Status PENDING  Incomplete  Culture, blood (Routine x 2)     Status: None (Preliminary result)   Collection Time: 04/27/16 11:05 AM  Result Value Ref Range Status   Specimen Description BLOOD LEFT  FOOT  Final   Special Requests IN PEDIATRIC BOTTLE Blood Culture adequate volume  Final   Culture NO GROWTH 4 DAYS  Final   Report Status PENDING  Incomplete  Urine culture     Status: Abnormal   Collection Time: 04/27/16 11:16 AM  Result Value Ref Range Status   Specimen Description URINE, RANDOM  Final   Special Requests NONE  Final   Culture MULTIPLE SPECIES PRESENT, SUGGEST RECOLLECTION (A)  Final   Report Status 04/28/2016 FINAL  Final  MRSA PCR Screening     Status: None   Collection Time: 04/27/16  5:32 PM  Result Value Ref Range Status   MRSA by PCR NEGATIVE NEGATIVE Final    Comment:        The GeneXpert MRSA Assay (FDA approved for NASAL specimens only), is one component of a comprehensive MRSA colonization surveillance program. It is not intended to diagnose MRSA infection nor to guide or monitor treatment for MRSA infections.          Radiology Studies: No results found.      Scheduled Meds: . baclofen  20 mg Per Tube QID  . buPROPion  75 mg Per Tube TID  . dantrolene  200 mg Per Tube QHS  . enoxaparin (LOVENOX) injection  40 mg Subcutaneous Q24H  . free water  240 mL Per Tube QID  . guaiFENesin-dextromethorphan  5 mL Per Tube TID  . hydrocortisone sod succinate (SOLU-CORTEF) inj  50 mg Intravenous Q12H  . ibuprofen  600 mg Per Tube Once  . ipratropium-albuterol  3 mL Nebulization TID  . levETIRAcetam  1,500 mg Per Tube BID  . loratadine  10 mg Per Tube Daily  . mouth rinse  15 mL Mouth Rinse BID  . omeprazole  40 mg Per Tube Daily  . polyethylene glycol  17 g Per Tube QPM  . polyvinyl alcohol  1 drop Both Eyes BID  . senna  2 tablet Oral QHS  . sodium chloride  3 mL Nebulization TID  . sodium chloride flush  3 mL Intravenous Q12H   Continuous Infusions: . feeding supplement (JEVITY 1.5 CAL/FIBER) 1,000 mL (05/01/16 0725)  . piperacillin-tazobactam (ZOSYN)  IV Stopped (05/01/16 0825)  . potassium chloride (KCL MULTIRUN) 30 mEq in 265 mL IVPB  30 mEq (05/01/16 1010)     LOS: 4 days    Time spent: 23 minutes    Olliver Boyadjian,Izzy, MD [facility] Pager [phone]  If 7PM-7AM, please contact night-coverage [email or web address] Password [facility] 05/01/2016, 12:57 PM

## 2016-05-02 DIAGNOSIS — J189 Pneumonia, unspecified organism: Secondary | ICD-10-CM

## 2016-05-02 LAB — CULTURE, BLOOD (ROUTINE X 2)
CULTURE: NO GROWTH
Culture: NO GROWTH
SPECIAL REQUESTS: ADEQUATE

## 2016-05-02 LAB — BASIC METABOLIC PANEL
Anion gap: 11 (ref 5–15)
BUN: 8 mg/dL (ref 6–20)
CHLORIDE: 108 mmol/L (ref 101–111)
CO2: 23 mmol/L (ref 22–32)
Calcium: 8.9 mg/dL (ref 8.9–10.3)
Creatinine, Ser: 0.69 mg/dL (ref 0.61–1.24)
GFR calc Af Amer: >60 mL/min (ref >60)
GFR calc non Af Amer: >60 mL/min (ref >60)
GLUCOSE: 139 mg/dL — AB (ref 65–99)
POTASSIUM: 3.2 mmol/L — AB (ref 3.5–5.1)
SODIUM: 142 mmol/L (ref 135–145)

## 2016-05-02 MED ORDER — AMOXICILLIN-POT CLAVULANATE 400-57 MG/5ML PO SUSR: 800.0000 mg | Freq: Two times a day (BID) | ORAL | 0 refills | Status: AC

## 2016-05-02 MED ORDER — AMOXICILLIN-POT CLAVULANATE 400-57 MG/5ML PO SUSR
800.0000 mg | Freq: Two times a day (BID) | ORAL | Status: DC
  Filled 2016-05-02: qty 10

## 2016-05-02 MED ORDER — PREDNISONE 10 MG PO TABS: 30.0000 mg | ORAL_TABLET | Freq: Every day | ORAL | 0 refills | Status: DC

## 2016-05-02 MED ORDER — PREDNISONE 5 MG/5ML PO SOLN: 5.0000 mg | Freq: Every day | ORAL | 0 refills | Status: DC

## 2016-05-02 MED ORDER — PREDNISONE 5 MG/ML PO CONC: 10.0000 mg | Freq: Every day | ORAL | 0 refills | Status: DC

## 2016-05-02 MED ORDER — GUAIFENESIN-DM 100-10 MG/5ML PO SYRP: 5.0000 mL | ORAL_SOLUTION | Freq: Three times a day (TID) | ORAL | 0 refills | Status: AC

## 2016-05-02 NOTE — Care Management Note (Signed)
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          Action/Plan: Plan is to d/c to home today with the resumption of Skilled Nursing care from [place] ([id number]). Pt will need PTAR to provide transportation to home. CM spoke with wife regarding d/c plan, and address confirmed.  Expected Discharge Date:  05/02/16               Expected Discharge Plan:  [place]  In-House Referral:     Discharge planning Services  CM Consult  Post Acute Care Choice:    Choice offered to:     DME Arranged:    DME Agency:     HH Arranged:   [place]:   [place], [place] made  [place] @ [phone] aware of discharge plan.  Status of Service:  Completed, signed off  If discussed at [place] of Stay Meetings, dates discussed:    Additional Comments:  Sharin Mons, RN 05/02/2016, 4:04 PM

## 2016-05-02 NOTE — Progress Notes (Addendum)
Alvera Singh discharged Home per MD order.  Called pt. Wife at home and reviewed discharge instructions.  All questions answered.  Allergies as of 05/02/2016   No Known Allergies     Medication List    TAKE these medications   acetaminophen 500 MG tablet Commonly known as:  TYLENOL Take 1 tablet (500 mg total) by mouth every 8 (eight) hours as needed for fever (or pain). What changed:  how much to take  when to take this   ACIDOPHILUS PO Place 1 capsule into feeding tube daily.   amoxicillin-clavulanate 400-57 MG/5ML suspension Commonly known as:  AUGMENTIN Place 10 mLs (800 mg total) into feeding tube every 12 (twelve) hours. Take for 3 days then stop.   baclofen 20 MG tablet Commonly known as:  LIORESAL Place 1 tablet (20 mg total) into feeding tube 4 (four) times daily.   bisacodyl 10 MG suppository Commonly known as:  DULCOLAX Place 10 mg rectally once as needed for moderate constipation.   buPROPion 75 MG tablet Commonly known as:  WELLBUTRIN Place 1 tablet (75 mg total) into feeding tube 3 (three) times daily.   cholecalciferol 1000 units tablet Commonly known as:  VITAMIN D Take 1 tablet (1,000 Units total) by mouth 2 (two) times daily. What changed:  how much to take  how to take this   Cranberry Juice Powder 425 MG Caps 1 capsule (425 mg total) by PEG Tube route 2 (two) times daily.   dantrolene 50 MG capsule Commonly known as:  DANTRIUM Give 200 mg by tube at bedtime.   diazepam 5 MG tablet Commonly known as:  VALIUM Place 5 mg into feeding tube at bedtime as needed for anxiety (sleep).   feeding supplement (JEVITY 1.5 CAL) Liqd Place 237 mLs into feeding tube 4 (four) times daily.   feeding supplement (OSMOLITE 1.5 CAL) Liqd Place 80 mL/hr into feeding tube See admin instructions. For 14 hours a day   fexofenadine 180 MG tablet Commonly known as:  ALLEGRA Place 180 mg into feeding tube daily.   free water Soln Place 240 mLs into  feeding tube every 4 (four) hours.   Garlic Oil 7846 MG Caps Give 1,000 mg by tube daily with breakfast.   guaiFENesin-dextromethorphan 100-10 MG/5ML syrup Commonly known as:  ROBITUSSIN DM Place 5 mLs into feeding tube 3 (three) times daily. Take for 3 days then stop.   HYDROcodone-acetaminophen 7.5-325 MG tablet Commonly known as:  [place] 1 tablet into feeding tube daily as needed (pain).   ipratropium-albuterol 0.5-2.5 (3) MG/3ML Soln Commonly known as:  DUONEB Take 3 mLs by nebulization 2 (two) times daily.   levETIRAcetam 500 MG tablet Commonly known as:  KEPPRA Take 3 tablets (1,500 mg total) by mouth 2 (two) times daily.   multivitamin with minerals Tabs tablet Place 1 tablet into feeding tube daily.   NALTREXONE HCL PO Give 4.5 mg by tube at bedtime.   neomycin-bacitracin-polymyxin [id number] ointment Apply 1 application topically 4 (four) times daily as needed (abraisions, irritations).   nystatin powder Generic drug:  nystatin Apply 1 g topically 3 (three) times daily. What changed:  when to take this  additional instructions   omeprazole 2 mg/mL Susp Commonly known as:  PRILOSEC Place 20 mLs (40 mg total) into feeding tube daily.   polyethylene glycol packet Commonly known as:  MIRALAX / GLYCOLAX Place 17 g into feeding tube daily. What changed:  when to take this   polyvinyl alcohol 1.4 % ophthalmic solution  Commonly known as:  LIQUIFILM TEARS Place 1 drop into both eyes 3 (three) times daily. What changed:  when to take this   predniSONE 10 MG tablet Commonly known as:  DELTASONE Place 3 tablets (30 mg total) into feeding tube daily with breakfast. 3 tablets (30mg ) daily x 2 days, then 2 tablets (20mg ) daily x 3 days, then 1 tablet(20mg ) daily x 3 days then back to home dose of 15 mg daily. Start taking on:  05/03/2016 What changed:  You were already taking a medication with the same name, and this prescription was added. Make sure you  understand how and when to take each.   predniSONE 5 MG/5ML solution Place 5 mLs (5 mg total) into feeding tube daily with breakfast. Resume after taper is done. Start taking on:  05/10/2016 What changed:  additional instructions   predniSONE 5 MG/ML concentrated solution Place 2 mLs (10 mg total) into feeding tube daily with breakfast. Resume after taper. Start taking on:  05/10/2016 What changed:  additional instructions   senna 8.6 MG Tabs tablet Commonly known as:  SENOKOT Take 2 tablets by mouth at bedtime.   SIMPLY SALINE 0.9 % Aers Generic drug:  Saline Place 1 spray into both nostrils as needed (for nasal conjestion).   sodium phosphate enema Place 1 enema rectally once as needed (for constipation). follow package directions   tiZANidine 2 MG tablet Commonly known as:  ZANAFLEX Take 1 tablet (2 mg total) by mouth every 6 (six) hours as needed for muscle spasms. What changed:  how to take this   vitamin C 500 MG tablet Commonly known as:  ASCORBIC ACID Place 500 mg into feeding tube 2 (two) times daily.       Patients skin is clean, dry and intact, no evidence of skin break down. IV site discontinued and catheter remains intact. Site without signs and symptoms of complications. Dressing and pressure applied.  Patient transported on a stretcher by non emergent EMS,  no distress noted upon discharge.  Wynetta Emery, Judi Jaffe C 05/02/2016 7:02 PM

## 2016-05-02 NOTE — Care Management Important Message (Signed)
Important Message  Patient Details  Name: Terry Palmer MRN: [id number] Date of Birth: 11-29-1975   Medicare Important Message Given:  Yes    Nathen May 05/02/2016, 12:33 PM

## 2016-05-02 NOTE — Discharge Summary (Addendum)
Physician Discharge Summary  Terry Palmer [id number] DOB: 13-Apr-1975 DOA: 04/27/2016  PCP: [name], MD  Admit date: 04/27/2016 Discharge date: 05/02/2016  Time spent: 65 minutes  Recommendations for Outpatient Follow-up:  1. Patient to follow up with [name], [name], MD in 1-2 weeks.Patient will need a basic metabolic profile done on follow-up to follow-up on electrolytes and renal function.   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  Diet recommendation: Patient on tube feeds.  Filed Weights   04/27/16 1011  Weight: 80.3 kg (177 lb)    History of present illness:  Per Dr. Corwin Palmer is a 41 y.o. 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 Patient presented with a rapid progression of increasing confusion, diminished interaction, and diminished urinary output since last night that is worse this morning area the patient additionally vomited and began to have a fever. His wife called EMS and brought the patient to ED. No palliating or provoking factors.  Emergency Department Course: Patient received fluid resuscitation 30 mL/kg. Started on broad-spectrum antibiotics with blood culture and urine culture sent. White count was 22.8 with a lactic acid of 4.7. All other blood work relatively normal. UA suggestive of UTI.  Hospital Course:  #1 sepsis/probable pneumonia Questionable etiology. Likely secondary to UTI in patient with chronic indwelling Foley  catheter. Patient also noted to have coarse breath sounds on examination and concern for probable clinical pneumonia. Chest x-ray on admission was negative. Repeat chest x-ray with low lung volumes and made and lower lung heterogeneous opacities bilaterally may represent atelectasis versus pneumonia. Patient did have some respiratory symptoms during the hospitalization on presentation. Patient has been pancultured and blood cultures pending with no growth to date. Urine cultures with multiple species present. Changed Foley catheter. Patient received Nasotracheal suctioning,Chest PT, Saline nebs, scheduled nebulizers, Robitussin. Patient was placed empirically on IV vancomycin and IV Zosyn on admission and asked patient improved clinically remained afebrile with WBC trending down IV antibiotics were transitioned to oral Augmentin and patient be discharged home on 3 more days of oral Augmentin to complete a course of antibiotic treatment. Outpatient follow-up.   #2 UTI in patient with chronic indwelling Foley catheter secondary to neurogenic bladder Patient with history of recurrent UTIs. Patient with indwelling suprapubic Foley catheter. Urinalysis consistent with a UTI. Urine cultures with multiple species present. Patient remained afebrile during the hospitalization. WBC trended down on empiric IV antibiotics. Foley catheter finally changed on 04/30/2016. Patient's IV antibiotics of Zosyn was subsequently transitioned to Augmentin and patient be discharged home on 3 more days of Augmentin to complete course of antibiotic treatment.   #3 lactic acidosis Likely secondary to problem #1 and 2. Repeat lactic acid level improved currently at 1.2. Repeat chest x-ray 04/29/2016 with low lung volumes with mid and lower lung heterogeneous opacities bilaterally may represent atelectasis versus pneumonia. Patient was maintained on IV antibiotics, IV fluids, supportive care, nebulizer treatments, chest PT. Patient  improved clinically and be discharged home in stable and improved condition on 3 more days  of antibiotics to complete a course of treatment.  #4 steroid-induced adrenal suppression Felt likely secondary to patient's sepsis. Patient was initially placed on IV stress dose steroids which was subsequently tapered down to prednisone via tube and patient be discharged on a prednisone taper back to his home dose of prednisone 15 mg daily. Outpatient follow-up.   #5 dementia Continued on home regimen of Keppra.  #7 quadriparesis/MS Secondary to multiple sclerosis. Continued on home regimen of baclofen, dantrolene. Frequent turning. Air mattress overlay.  #8 G-tube feedings Patient maintained on Jevity. Free water flushes.    Procedures:  Chest x-ray 04/27/2016, 04/29/2016  Consultations:  None  Discharge Exam: Vitals:   05/01/16 2108 05/02/16 0700  BP: 118/66 113/77  Pulse: 87 82  Resp: 18 18  Temp: 98.1 F (36.7 C) 99.2 F (37.3 C)    General: NAD Cardiovascular: RRR Respiratory: CTAB  Discharge Instructions   Discharge Instructions    Increase activity slowly    Complete by:  As directed      Current Discharge Medication List    START taking these medications   Details  amoxicillin-clavulanate (AUGMENTIN) 400-57 MG/5ML suspension Place 10 mLs (800 mg total) into feeding tube every 12 (twelve) hours. Take for 3 days then stop. Qty: 100 mL, Refills: 0    guaiFENesin-dextromethorphan (ROBITUSSIN DM) 100-10 MG/5ML syrup Place 5 mLs into feeding tube 3 (three) times daily. Take for 3 days then stop. Qty: 118 mL, Refills: 0    predniSONE (DELTASONE) 10 MG tablet Place 3 tablets (30 mg total) into feeding tube daily with breakfast. 3 tablets (30mg ) daily x 2 days, then 2 tablets (20mg ) daily x 3 days, then 1 tablet(20mg ) daily x 3 days then back to home dose of 15 mg daily. Qty: 15 tablet, Refills: 0      CONTINUE these medications which have CHANGED   Details   predniSONE 5 MG/5ML solution Place 5 mLs (5 mg total) into feeding tube daily with breakfast. Resume after taper is done. Qty: 100 mL, Refills: 0    predniSONE 5 MG/ML concentrated solution Place 2 mLs (10 mg total) into feeding tube daily with breakfast. Resume after taper. Qty: 10 mL, Refills: 0      CONTINUE these medications which have NOT CHANGED   Details  acetaminophen (TYLENOL) 500 MG tablet Take 1 tablet (500 mg total) by mouth every 8 (eight) hours as needed for fever (or pain). Qty: 30 tablet, Refills: 0    baclofen (LIORESAL) 20 MG tablet Place 1 tablet (20 mg total) into feeding tube 4 (four) times daily. Qty: 120 each, Refills: 5    bisacodyl (DULCOLAX) 10 MG suppository Place 10 mg rectally once as needed for moderate constipation.    buPROPion (WELLBUTRIN) 75 MG tablet Place 1 tablet (75 mg total) into feeding tube 3 (three) times daily. Qty: 90 tablet, Refills: 1    cholecalciferol (VITAMIN D) 1000 UNITS tablet Take 1 tablet (1,000 Units total) by mouth 2 (two) times daily. Qty: 60 tablet, Refills: 1    Cranberry Juice Powder 425 MG CAPS 1 capsule (425 mg total) by PEG Tube route 2 (two) times daily. Qty: 90 each, Refills: 0    dantrolene (DANTRIUM) 50 MG capsule Give 200 mg by tube at bedtime.     diazepam (VALIUM) 5 MG tablet Place 5 mg into feeding tube at bedtime as needed for anxiety (sleep).     fexofenadine (ALLEGRA) 180 MG tablet Place 180 mg into feeding tube daily.  Garlic Oil 4010 MG CAPS Give 1,000 mg by tube daily with breakfast.     HYDROcodone-acetaminophen (NORCO) 7.5-325 MG per tablet Place 1 tablet into feeding tube daily as needed (pain).     ipratropium-albuterol (DUONEB) 0.5-2.5 (3) MG/3ML SOLN Take 3 mLs by nebulization 2 (two) times daily.    Lactobacillus (ACIDOPHILUS PO) Place 1 capsule into feeding tube daily.     levETIRAcetam (KEPPRA) 500 MG tablet Take 3 tablets (1,500 mg total) by mouth 2 (two) times daily. Qty: 180 tablet,  Refills: 1    Multiple Vitamin (MULTIVITAMIN WITH MINERALS) TABS tablet Place 1 tablet into feeding tube daily. Qty: 30 tablet, Refills: 0    NALTREXONE HCL PO Give 4.5 mg by tube at bedtime.    neomycin-bacitracin-polymyxin (NEOSPORIN) [id number] ointment Apply 1 application topically 4 (four) times daily as needed (abraisions, irritations).    !! Nutritional Supplements (FEEDING SUPPLEMENT, JEVITY 1.5 CAL,) LIQD Place 237 mLs into feeding tube 4 (four) times daily.    nystatin (MYCOSTATIN/NYSTOP) 100000 UNIT/GM POWD Apply 1 g topically 3 (three) times daily. Qty: 15 g, Refills: 0    omeprazole (PRILOSEC) 2 mg/mL SUSP Place 20 mLs (40 mg total) into feeding tube daily. Qty: 600 mL, Refills: 1    polyethylene glycol (MIRALAX / GLYCOLAX) packet Place 17 g into feeding tube daily. Qty: 30 each, Refills: 1    polyvinyl alcohol (LIQUIFILM TEARS) 1.4 % ophthalmic solution Place 1 drop into both eyes 3 (three) times daily. Qty: 15 mL, Refills: 0    Saline (SIMPLY SALINE) 0.9 % AERS Place 1 spray into both nostrils as needed (for nasal conjestion).    senna (SENOKOT) 8.6 MG TABS tablet Take 2 tablets by mouth at bedtime.     sodium phosphate (FLEET) enema Place 1 enema rectally once as needed (for constipation). follow package directions    tiZANidine (ZANAFLEX) 2 MG tablet Take 1 tablet (2 mg total) by mouth every 6 (six) hours as needed for muscle spasms. Qty: 120 tablet, Refills: 0    vitamin C (ASCORBIC ACID) 500 MG tablet Place 500 mg into feeding tube 2 (two) times daily.    Water For Irrigation, Sterile (FREE WATER) SOLN Place 240 mLs into feeding tube every 4 (four) hours. Qty: 1000 mL, Refills: 1    !! Nutritional Supplements (FEEDING SUPPLEMENT, OSMOLITE 1.5 CAL,) LIQD Place 80 mL/hr into feeding tube See admin instructions. For 14 hours a day Qty: 1000 mL, Refills: 0     !! - Potential duplicate medications found. Please discuss with provider.     No Known  Allergies Follow-up Information    [name], [name], MD. Schedule an appointment as soon as possible for a visit in 1 week(s).   Specialty:  Family Medicine Why:  f/u in 1-2 weeks. Contact information: [address] [place] [address] [phone]            The results of significant diagnostics from this hospitalization (including imaging, microbiology, ancillary and laboratory) are listed below for reference.    Significant Diagnostic Studies: Dg Chest Port 1 View  Result Date: 04/29/2016 CLINICAL DATA:  Patient with fever and pneumonia. EXAM: PORTABLE CHEST 1 VIEW COMPARISON:  Chest radiograph 04/27/2016 FINDINGS: Low lung volumes. Monitoring leads overlie the patient. Stable cardiac and mediastinal contours. Heterogeneous opacities within the mid and lower lungs bilaterally. No pleural effusion or pneumothorax. IMPRESSION: Low lung volumes with mid and lower lung heterogeneous opacities bilaterally which may represent atelectasis or pneumonia. Electronically Signed  By: Lovey Newcomer M.D.   On: 04/29/2016 09:20   Dg Chest Port 1 View  Result Date: 04/27/2016 CLINICAL DATA:  Sepsis. EXAM: PORTABLE CHEST 1 VIEW COMPARISON:  02/10/2016 and prior radiographs FINDINGS: The cardiomediastinal silhouette is unremarkable. This is a low volume film. There is no evidence of focal airspace disease, pulmonary edema, suspicious pulmonary nodule/mass, pleural effusion, or pneumothorax. No acute bony abnormalities are identified. IMPRESSION: No active disease. Electronically Signed   By: Margarette Canada M.D.   On: 04/27/2016 11:42    Microbiology: Recent Results (from the past 240 hour(s))  Culture, blood (Routine x 2)     Status: None   Collection Time: 04/27/16 10:35 AM  Result Value Ref Range Status   Specimen Description BLOOD RIGHT HAND  Final   Special Requests   Final    BOTTLES DRAWN AEROBIC AND ANAEROBIC Blood Culture results may not be optimal due to an inadequate volume of blood  received in culture bottles   Culture NO GROWTH 5 DAYS  Final   Report Status 05/02/2016 FINAL  Final  Culture, blood (Routine x 2)     Status: None   Collection Time: 04/27/16 11:05 AM  Result Value Ref Range Status   Specimen Description BLOOD LEFT FOOT  Final   Special Requests IN PEDIATRIC BOTTLE Blood Culture adequate volume  Final   Culture NO GROWTH 5 DAYS  Final   Report Status 05/02/2016 FINAL  Final  Urine culture     Status: Abnormal   Collection Time: 04/27/16 11:16 AM  Result Value Ref Range Status   Specimen Description URINE, RANDOM  Final   Special Requests NONE  Final   Culture MULTIPLE SPECIES PRESENT, SUGGEST RECOLLECTION (A)  Final   Report Status 04/28/2016 FINAL  Final  MRSA PCR Screening     Status: None   Collection Time: 04/27/16  5:32 PM  Result Value Ref Range Status   MRSA by PCR NEGATIVE NEGATIVE Final    Comment:        The GeneXpert MRSA Assay (FDA approved for NASAL specimens only), is one component of a comprehensive MRSA colonization surveillance program. It is not intended to diagnose MRSA infection nor to guide or monitor treatment for MRSA infections.      Labs: Basic Metabolic Panel:  Recent Labs Lab 04/27/16 1052 04/28/16 0417 04/29/16 0302 04/30/16 0457 05/01/16 0357  NA 140 135 140 141 145  K 4.3 3.6 3.3* 2.9* 3.3*  CL 100* 104 104 107 109  CO2 22 19* 24 24 26   GLUCOSE 118* 80 90 81 98  BUN 15 8 7 6 6   CREATININE 0.79 0.51* 0.48* 0.52* 0.62  CALCIUM 9.8 8.2* 8.7* 8.6* 9.1  MG  --   --  2.1  --   --    Liver Function Tests:  Recent Labs Lab 04/27/16 1052  AST 29  ALT 22  ALKPHOS 89  BILITOT 0.7  PROT 8.0  ALBUMIN 4.5    Recent Labs Lab 04/27/16 1052  LIPASE 16   No results for input(s): AMMONIA in the last 168 hours. CBC:  Recent Labs Lab 04/27/16 1052 04/28/16 0417 04/29/16 0302 04/30/16 0457 05/01/16 0357  WBC 22.8* 17.8* 12.8* 13.2* 9.9  NEUTROABS 20.7*  --  10.5* 9.3*  --   HGB 14.2 11.3*  14.0 12.1* 12.8*  HCT 43.3 34.6* 41.0 36.5* 39.4  MCV 94.1 93.3 91.7 91.5 92.5  PLT 287 211 177 206 237   Cardiac Enzymes: No results for  input(s): CKTOTAL, CKMB, CKMBINDEX, TROPONINI in the last 168 hours. BNP: BNP (last 3 results) No results for input(s): BNP in the last 8760 hours.  ProBNP (last 3 results) No results for input(s): PROBNP in the last 8760 hours.  CBG: No results for input(s): GLUCAP in the last 168 hours.     SignedIrine Seal MD.  [facility] 05/02/2016, 3:13 PM

## 2016-10-20 ENCOUNTER — Emergency Department (HOSPITAL_COMMUNITY)
Admission: EM | Admit: 2016-10-20 | Discharge: 2016-10-20 | Disposition: A | Discharge status: Home / Self Care | Payer: Medicare HMO | Attending: Emergency Medicine | Admitting: Emergency Medicine

## 2016-10-20 ENCOUNTER — Encounter (HOSPITAL_COMMUNITY): Payer: Self-pay

## 2016-10-20 DIAGNOSIS — Y649 Contaminated medical or biological substance administered by unspecified means: Secondary | ICD-10-CM | POA: Diagnosis not present

## 2016-10-20 DIAGNOSIS — T839XXA Unspecified complication of genitourinary prosthetic device, implant and graft, initial encounter: Secondary | ICD-10-CM | POA: Diagnosis present

## 2016-10-20 DIAGNOSIS — Z79899 Other long term (current) drug therapy: Secondary | ICD-10-CM | POA: Diagnosis not present

## 2016-10-20 DIAGNOSIS — Z87891 Personal history of nicotine dependence: Secondary | ICD-10-CM | POA: Diagnosis not present

## 2016-10-20 DIAGNOSIS — F0390 Unspecified dementia without behavioral disturbance: Secondary | ICD-10-CM | POA: Insufficient documentation

## 2016-10-20 DIAGNOSIS — N319 Neuromuscular dysfunction of bladder, unspecified: Secondary | ICD-10-CM | POA: Insufficient documentation

## 2016-10-20 HISTORY — DX: Epilepsy, unspecified, not intractable, without status epilepticus: G40.909

## 2016-10-20 LAB — BASIC METABOLIC PANEL
Anion gap: 10 (ref 5–15)
BUN: 15 mg/dL (ref 6–20)
CO2: 25 mmol/L (ref 22–32)
Calcium: 9.2 mg/dL (ref 8.9–10.3)
Chloride: 103 mmol/L (ref 101–111)
Creatinine, Ser: 0.63 mg/dL (ref 0.61–1.24)
GFR calc Af Amer: >60 mL/min (ref >60)
GFR calc non Af Amer: >60 mL/min (ref >60)
Glucose, Bld: 91 mg/dL (ref 65–99)
Potassium: 3.9 mmol/L (ref 3.5–5.1)
Sodium: 138 mmol/L (ref 135–145)

## 2016-10-20 LAB — CBC WITH DIFFERENTIAL/PLATELET
Basophils Absolute: 0.1 10*3/uL (ref 0.0–0.1)
Basophils Relative: 0 %
Eosinophils Absolute: 0.4 10*3/uL (ref 0.0–0.7)
Eosinophils Relative: 4 %
HCT: 42.7 % (ref 39.0–52.0)
Hemoglobin: 14.4 g/dL (ref 13.0–17.0)
Lymphocytes Relative: 16 %
Lymphs Abs: 1.9 10*3/uL (ref 0.7–4.0)
MCH: 30.8 pg (ref 26.0–34.0)
MCHC: 33.7 g/dL (ref 30.0–36.0)
MCV: 91.2 fL (ref 78.0–100.0)
Monocytes Absolute: 0.8 10*3/uL (ref 0.1–1.0)
Monocytes Relative: 7 %
Neutro Abs: 8.3 10*3/uL — ABNORMAL HIGH (ref 1.7–7.7)
Neutrophils Relative %: 73 %
Platelets: 278 10*3/uL (ref 150–400)
RBC: 4.68 MIL/uL (ref 4.22–5.81)
RDW: 13.1 % (ref 11.5–15.5)
WBC: 11.4 10*3/uL — ABNORMAL HIGH (ref 4.0–10.5)

## 2016-10-20 LAB — URINALYSIS, ROUTINE W REFLEX MICROSCOPIC
Bilirubin Urine: NEGATIVE
Glucose, UA: NEGATIVE mg/dL
Ketones, ur: NEGATIVE mg/dL
Nitrite: NEGATIVE
Protein, ur: NEGATIVE mg/dL
Specific Gravity, Urine: 1.006 (ref 1.005–1.030)
Squamous Epithelial / LPF: NONE SEEN
pH: 7 (ref 5.0–8.0)

## 2016-10-20 NOTE — ED Triage Notes (Addendum)
Pt BIB EMS from home- Per EMS, pt has hx of MS, wife reported that pt has not been making very much urine yeaterday and no output today. Hx of UTI in past, same s/s as previous UTIs. Pt has suprapubic catheter. EMS states pt is at baseline otherwise.   EMS VS: 118/82, HR 98, RR 16, O2 98%, CBG 127. Wife reports that pt has a urine sample sent yesterday to [facility], sample has not resulted. Was placed on a prophylactic abx. Last time bag was changed 10/10.

## 2016-10-20 NOTE — ED Provider Notes (Signed)
[facility] DEPT Provider Note   CSN: [id number] Arrival date & time: 10/20/16  1349     History   Chief Complaint No chief complaint on file.   HPI Terry Palmer is a 41 y.o. male.  HPI   41 year old male with nonfunctioning suprapubic catheter. Neurogenic bladder. Chronic catheter. Wife reports that was just changed within the last week. He is currently on antibiotic UTI. She has not noticed any drainage into the bag and the past 12 hours or so. Afebrile. Otherwise appears to be at his baseline.  Past Medical History:  Diagnosis Date  . Aspiration pneumonia ([place]) 01/20/2013  . Bladder calculi   . Childhood asthma   . 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 Depression   . Dysphagia   . 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 MS (multiple sclerosis) ([place])   . Neurogenic bladder   . Neuromuscular disorder ([place])    Quadraperesis  . Normocytic anemia 05/28/2011  . Quadriparesis (muscle weakness) ([place]) 03/12/2011  . Quadriplegia and quadriparesis ([place]) 12/24/2014  . Recurrent upper respiratory infection (URI)   . Recurrent UTI   . Seizure disorder ([place])   . Shortness of breath     Patient Active Problem List   Diagnosis Date Noted  . Pneumonia due to infectious organism   . 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 Lactic acidosis 04/27/2016  . Acute cystitis with hematuria   . Suprapubic catheter ([place])   . G tube feedings ([place])   . Flexion contractures   . Encephalopathy   . Bacteremia 05/27/2015  . Sepsis ([place]) 05/25/2015  . Altered mental state 05/25/2015  . Altered mental status 05/25/2015  . Fever, unspecified 03/04/2015  . Quadriplegia and quadriparesis ([place]) 12/24/2014  . 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 Fever   . 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 Protein-calorie malnutrition, severe ([place]) 10/21/2014  . Sepsis secondary to UTI ([place]) 10/20/2014  . Leukocytosis 10/20/2014  . Acute respiratory  failure with hypoxia ([place]) 10/20/2014  . Seizure disorder ([place]) 10/20/2014  . Aspiration pneumonitis ([place]) 10/20/2014  . Neurogenic bladder 05/28/2011  . Multiple sclerosis ([place]) 04/20/2011  . FTT (failure to thrive) in adult 04/20/2011  . Sacral decubitus ulcer, stage II 04/20/2011  . Quadriparesis (muscle weakness) ([place]) 03/12/2011    Past Surgical History:  Procedure Laterality Date  . GASTROSTOMY  04/16/2011   Procedure: GASTROSTOMY;  Surgeon: Zenovia Jarred, MD;  Location: [facility];  Service: General;  Laterality: N/A;  Open G-Tube placement  . LUMBAR PUNCTURE  10/12/2002       Home Medications    Prior to Admission medications   Medication Sig Start Date End Date Taking? Authorizing Provider  acetaminophen (TYLENOL) 500 MG tablet Take 1 tablet (500 mg total) by mouth every 8 (eight) hours as needed for fever (or pain). Patient taking differently: Take 1,000 mg by mouth 2 (two) times daily.  03/05/15   Debbe Odea, MD  baclofen (LIORESAL) 20 MG tablet Place 1 tablet (20 mg total) into feeding tube 4 (four) times daily. 07/13/15   Kathrynn Ducking, MD  bisacodyl (DULCOLAX) 10 MG suppository Place 10 mg rectally once as needed for moderate constipation.    [provider]  buPROPion (WELLBUTRIN) 75 MG tablet Place 1 tablet (75 mg total) into feeding tube 3 (three) times daily. 01/24/13   Bonnielee Haff, MD  cholecalciferol (VITAMIN D) 1000 UNITS tablet Take 1 tablet (1,000 Units total) by mouth 2 (two) times daily. Patient taking  differently: Give 5,000 Units by tube 2 (two) times daily.  01/24/13   Bonnielee Haff, MD  Cranberry Juice Powder 425 MG CAPS 1 capsule (425 mg total) by PEG Tube route 2 (two) times daily. 01/24/13   Bonnielee Haff, MD  dantrolene (DANTRIUM) 50 MG capsule Give 200 mg by tube at bedtime.     [provider]  diazepam (VALIUM) 5 MG tablet Place 5 mg into feeding tube at bedtime as needed for anxiety (sleep).     [provider]    fexofenadine (ALLEGRA) 180 MG tablet Place 180 mg into feeding tube daily.    [provider]  Garlic Oil 8295 MG CAPS Give 1,000 mg by tube daily with breakfast.     [provider]  HYDROcodone-acetaminophen (NORCO) 7.5-325 MG per tablet Place 1 tablet into feeding tube daily as needed (pain).     [provider]  ipratropium-albuterol (DUONEB) 0.5-2.5 (3) MG/3ML SOLN Take 3 mLs by nebulization 2 (two) times daily.    [provider]  Lactobacillus (ACIDOPHILUS PO) Place 1 capsule into feeding tube daily.     [provider]  levETIRAcetam (KEPPRA) 500 MG tablet Take 3 tablets (1,500 mg total) by mouth 2 (two) times daily. 05/29/15   Jule Ser, DO  Multiple Vitamin (MULTIVITAMIN WITH MINERALS) TABS tablet Place 1 tablet into feeding tube daily. 01/24/13   Bonnielee Haff, MD  NALTREXONE HCL PO Give 4.5 mg by tube at bedtime.    [provider]  neomycin-bacitracin-polymyxin (NEOSPORIN) [phone] ointment Apply 1 application topically 4 (four) times daily as needed (abraisions, irritations).    [provider]  Nutritional Supplements (FEEDING SUPPLEMENT, JEVITY 1.5 CAL,) LIQD Place 237 mLs into feeding tube 4 (four) times daily.    [provider]  Nutritional Supplements (FEEDING SUPPLEMENT, OSMOLITE 1.5 CAL,) LIQD Place 80 mL/hr into feeding tube See admin instructions. For 14 hours a day Patient not taking: Reported on 04/27/2016 05/29/15   Jule Ser, DO  nystatin (MYCOSTATIN/NYSTOP) 100000 UNIT/GM POWD Apply 1 g topically 3 (three) times daily. Patient taking differently: Apply 1 g topically daily. Applies to groin 01/24/13   Bonnielee Haff, MD  omeprazole (PRILOSEC) 2 mg/mL SUSP Place 20 mLs (40 mg total) into feeding tube daily. 01/24/13   Bonnielee Haff, MD  polyethylene glycol [facility] / Floria Raveling) packet Place 17 g into feeding tube daily. Patient taking differently: Place 17 g into feeding tube every  evening.  01/24/13   Bonnielee Haff, MD  polyvinyl alcohol (LIQUIFILM TEARS) 1.4 % ophthalmic solution Place 1 drop into both eyes 3 (three) times daily. Patient taking differently: Place 1 drop into both eyes 2 (two) times daily.  01/24/13   Bonnielee Haff, MD  predniSONE (DELTASONE) 10 MG tablet Place 3 tablets (30 mg total) into feeding tube daily with breakfast. 3 tablets (30mg ) daily x 2 days, then 2 tablets (20mg ) daily x 3 days, then 1 tablet(20mg ) daily x 3 days then back to home dose of 15 mg daily. 05/03/16   Eugenie Filler, MD  predniSONE 5 MG/5ML solution Place 5 mLs (5 mg total) into feeding tube daily with breakfast. Resume after taper is done. 05/10/16   Eugenie Filler, MD  predniSONE 5 MG/ML concentrated solution Place 2 mLs (10 mg total) into feeding tube daily with breakfast. Resume after taper. 05/10/16   Eugenie Filler, MD  Saline (SIMPLY SALINE) 0.9 % AERS Place 1 spray into both nostrils as needed (for nasal conjestion).    [provider]  senna (SENOKOT) 8.6 MG TABS tablet Take 2 tablets by mouth at bedtime.     [provider]  sodium phosphate (FLEET) enema Place 1 enema rectally once as needed (for constipation). follow package directions    [provider]  tiZANidine (ZANAFLEX) 2 MG tablet Take 1 tablet (2 mg total) by mouth every 6 (six) hours as needed for muscle spasms. Patient taking differently: Place 2 mg into feeding tube every 6 (six) hours as needed for muscle spasms.  11/25/15   Kathrynn Ducking, MD  vitamin C (ASCORBIC ACID) 500 MG tablet Place 500 mg into feeding tube 2 (two) times daily.    [provider]  Water For Irrigation, Sterile (FREE WATER) SOLN Place 240 mLs into feeding tube every 4 (four) hours. 01/24/13   Bonnielee Haff, MD    Family History Family History  Problem Relation Age of Onset  . Asthma Mother     Social History Social History  Substance Use Topics  . Smoking status: Former Smoker     Types: Cigarettes    Quit date: 02/02/1999  . Smokeless tobacco: Former Systems developer  . Alcohol use No     Allergies   Patient has no known allergies.   Review of Systems Review of Systems  All systems reviewed and negative, other than as noted in HPI.  Physical Exam Updated Vital Signs BP (!) 122/93 (BP Location: Left Arm)   Pulse 95   Temp (!) 97.5 F (36.4 C) (Oral)   Resp 20   SpO2 93%   Physical Exam  Constitutional: He appears well-developed. No distress.  Laying in bed. Chronically ill appearing. No acute distress. Nonverbal. Contractures  HENT:  Head: Normocephalic and atraumatic.  Eyes: Conjunctivae are normal. Right eye exhibits no discharge. Left eye exhibits no discharge.  Neck: Neck supple.  Cardiovascular: Normal rate, regular rhythm and normal heart sounds.  Exam reveals no gallop and no friction rub.   No murmur heard. Pulmonary/Chest: Effort normal and breath sounds normal. No respiratory distress.  Abdominal: Soft. He exhibits no distension. There is no tenderness.  Suprapubic catheter. No leakage. Surrounding skin normal in appearance. Cloudy urine with sediment noted in bag. No urine in tubing.  Musculoskeletal: He exhibits no edema or tenderness.  Neurological: He is alert.  Skin: Skin is warm and dry.  Nursing note and vitals reviewed.    ED Treatments / Results  Labs (all labs ordered are listed, but only abnormal results are displayed) Labs Reviewed  URINE CULTURE - Abnormal; Notable for the following:       Result Value   Culture MULTIPLE SPECIES PRESENT, SUGGEST RECOLLECTION (*)    All other components within normal limits  URINALYSIS, ROUTINE W REFLEX MICROSCOPIC - Abnormal; Notable for the following:    APPearance HAZY (*)    Hgb urine dipstick MODERATE (*)    Leukocytes, UA LARGE (*)    Bacteria, UA MANY (*)    All other components within normal limits  CBC WITH DIFFERENTIAL/PLATELET - Abnormal; Notable for the following:    WBC 11.4 (*)     Neutro Abs 8.3 (*)    All other components within normal limits  BASIC METABOLIC PANEL    EKG  EKG Interpretation None       Radiology No results found.  Procedures ASPIRATION BLADDER INSERT SUPRAPUBIC CATHETER Date/Time: 10/24/2016 10:25 AM Performed by: Virgel Manifold Authorized by: Virgel Manifold  Consent: Verbal consent obtained. Consent given by: spouse Patient identity confirmed:  verbally with patient and provided demographic data Preparation: Patient was prepped and draped in the usual sterile fashion. Local anesthesia used: no  Anesthesia: Local anesthesia used: no  Sedation: Patient sedated: no Patient tolerance: Patient tolerated the procedure well with no immediate complications Comments: Area prepped with Betadine. Existing suprapubic catheter was removed. Balloon was deflated. Removed easily. New 22 French catheter was placed without difficulty. Return of cloudy urine with sediment. It was inflated with 30 mL of saline and connected to leg bag.    (including critical care time)  Medications Ordered in ED Medications - No data to display   Initial Impression / Assessment and Plan / ED Course  I have reviewed the triage vital signs and the nursing notes.  Pertinent labs & imaging results that were available during my care of the patient were reviewed by me and considered in my medical decision making (see chart for details).     41 year old male with a nonfunctioning suprapubic catheter. I suspect that this is likely clogged with sediment. Tried irrigating it without improvement but he did get some leakage from his penis. This is probably overflow from backup pressure. A new catheter was placed and appears to be now functioning appropriately. He is already currently on antibiotics. We'll send a urine culture.  Final Clinical Impressions(s) / ED Diagnoses   Final diagnoses:  Problem with Foley catheter, initial encounter [facility])    New  Prescriptions New Prescriptions   No medications on file     Virgel Manifold, MD 10/24/16 1028

## 2016-10-20 NOTE — ED Notes (Signed)
Bed: WA17 Expected date:  Expected time:  Means of arrival:  Comments: UTI-quad

## 2016-10-20 NOTE — ED Notes (Signed)
Attempted catheter irrigation, 30 mL inserted, no return. 20 mL inserted, pt withdrew from pain. Pad underneath pt saturated with urine. No drainage noted around suprapubic cath. Pt's wife states urine comes out of pt's penis when he has had UTIs in the past.

## 2016-10-20 NOTE — ED Notes (Signed)
[facility] called for transport.  

## 2016-10-20 NOTE — Discharge Instructions (Signed)
Follow-up with urologist as needed. Finish taking previous prescribed antibiotics.

## 2016-10-21 LAB — URINE CULTURE

## 2017-01-22 ENCOUNTER — Emergency Department (HOSPITAL_COMMUNITY)
Admission: EM | Admit: 2017-01-22 | Discharge: 2017-01-22 | Disposition: A | Discharge status: Home / Self Care | Payer: Medicare HMO | Attending: Emergency Medicine | Admitting: Emergency Medicine

## 2017-01-22 DIAGNOSIS — F0390 Unspecified dementia without behavioral disturbance: Secondary | ICD-10-CM | POA: Diagnosis not present

## 2017-01-22 DIAGNOSIS — Z87891 Personal history of nicotine dependence: Secondary | ICD-10-CM | POA: Insufficient documentation

## 2017-01-22 DIAGNOSIS — Z79899 Other long term (current) drug therapy: Secondary | ICD-10-CM | POA: Insufficient documentation

## 2017-01-22 DIAGNOSIS — Z431 Encounter for attention to gastrostomy: Secondary | ICD-10-CM | POA: Insufficient documentation

## 2017-01-22 NOTE — ED Triage Notes (Signed)
Transported by [facility] from home. Home health nurse was attempting to administer medications through G-tube and noticed that the tube itself was leaking. Hx of MS. Patient contracted and non-verbal.

## 2017-01-22 NOTE — ED Provider Notes (Signed)
[facility] DEPT Provider Note   CSN: [id number] Arrival date & time: 01/22/17  1030     History   Chief Complaint Chief Complaint  Patient presents with  . G tube replacement    HPI Terry Palmer is a 42 y.o. male.  HPI   42 year old male sent for evaluation of leaking gastrostomy tube.  Patient has a history of MS and is nonverbal.  Apparently home health nurse noticed it was leaking during attempted administration of medications earlier today.  Mother at bedside. States he has had gtube 10+ years.  Unclear when current tube was most recently placed.  Past Medical History:  Diagnosis Date  . Aspiration pneumonia ([place]) 01/20/2013  . Bladder calculi   . Childhood asthma   . 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 Depression   . Dysphagia   . 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 MS (multiple sclerosis) ([place])   . Neurogenic bladder   . Neuromuscular disorder ([place])    Quadraperesis  . Normocytic anemia 05/28/2011  . Quadriparesis (muscle weakness) ([place]) 03/12/2011  . Quadriplegia and quadriparesis ([place]) 12/24/2014  . Recurrent upper respiratory infection (URI)   . Recurrent UTI   . Seizure disorder ([place])   . Shortness of breath     Patient Active Problem List   Diagnosis Date Noted  . Pneumonia due to infectious organism   . 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 Lactic acidosis 04/27/2016  . Acute cystitis with hematuria   . Suprapubic catheter ([place])   . G tube feedings ([place])   . Flexion contractures   . Encephalopathy   . Bacteremia 05/27/2015  . Sepsis ([place]) 05/25/2015  . Altered mental state 05/25/2015  . Altered mental status 05/25/2015  . Fever, unspecified 03/04/2015  . Quadriplegia and quadriparesis ([place]) 12/24/2014  . 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 Fever   . 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 Protein-calorie malnutrition, severe ([place]) 10/21/2014  . Sepsis  secondary to UTI ([place]) 10/20/2014  . Leukocytosis 10/20/2014  . Acute respiratory failure with hypoxia ([place]) 10/20/2014  . Seizure disorder ([place]) 10/20/2014  . Aspiration pneumonitis ([place]) 10/20/2014  . Neurogenic bladder 05/28/2011  . Multiple sclerosis ([place]) 04/20/2011  . FTT (failure to thrive) in adult 04/20/2011  . Sacral decubitus ulcer, stage II 04/20/2011  . Quadriparesis (muscle weakness) ([place]) 03/12/2011    Past Surgical History:  Procedure Laterality Date  . GASTROSTOMY  04/16/2011   Procedure: GASTROSTOMY;  Surgeon: Zenovia Jarred, MD;  Location: [facility];  Service: General;  Laterality: N/A;  Open G-Tube placement  . LUMBAR PUNCTURE  10/12/2002       Home Medications    Prior to Admission medications   Medication Sig Start Date End Date Taking? Authorizing Provider  acetaminophen (TYLENOL) 500 MG tablet Take 1 tablet (500 mg total) by mouth every 8 (eight) hours as needed for fever (or pain). Patient taking differently: Take 1,000 mg by mouth 2 (two) times daily.  03/05/15   Debbe Odea, MD  baclofen (LIORESAL) 20 MG tablet Place 1 tablet (20 mg total) into feeding tube 4 (four) times daily. 07/13/15   Kathrynn Ducking, MD  bisacodyl (DULCOLAX) 10 MG suppository Place 10 mg rectally once as needed for moderate constipation.    [provider]  buPROPion (WELLBUTRIN) 75 MG tablet Place 1 tablet (75 mg total) into feeding tube 3 (three) times daily. 01/24/13   Bonnielee Haff, MD  cholecalciferol (VITAMIN D) 1000 UNITS  tablet Take 1 tablet (1,000 Units total) by mouth 2 (two) times daily. Patient taking differently: Give 5,000 Units by tube 2 (two) times daily.  01/24/13   Bonnielee Haff, MD  Cranberry Juice Powder 425 MG CAPS 1 capsule (425 mg total) by PEG Tube route 2 (two) times daily. 01/24/13   Bonnielee Haff, MD  dantrolene (DANTRIUM) 50 MG capsule Give 200 mg by tube at bedtime.     [provider]  diazepam (VALIUM) 5 MG tablet Place 5 mg into  feeding tube at bedtime as needed for anxiety (sleep).     [provider]  fexofenadine (ALLEGRA) 180 MG tablet Place 180 mg into feeding tube daily.    [provider]  Garlic Oil 3532 MG CAPS Give 1,000 mg by tube daily with breakfast.     [provider]  HYDROcodone-acetaminophen (NORCO) 7.5-325 MG per tablet Place 1 tablet into feeding tube daily as needed (pain).     [provider]  ipratropium-albuterol (DUONEB) 0.5-2.5 (3) MG/3ML SOLN Take 3 mLs by nebulization 2 (two) times daily.    [provider]  Lactobacillus (ACIDOPHILUS PO) Place 1 capsule into feeding tube daily.     [provider]  levETIRAcetam (KEPPRA) 500 MG tablet Take 3 tablets (1,500 mg total) by mouth 2 (two) times daily. 05/29/15   Jule Ser, DO  Multiple Vitamin (MULTIVITAMIN WITH MINERALS) TABS tablet Place 1 tablet into feeding tube daily. 01/24/13   Bonnielee Haff, MD  NALTREXONE HCL PO Give 4.5 mg by tube at bedtime.    [provider]  neomycin-bacitracin-polymyxin (NEOSPORIN) [phone] ointment Apply 1 application topically 4 (four) times daily as needed (abraisions, irritations).    [provider]  Nutritional Supplements (FEEDING SUPPLEMENT, JEVITY 1.5 CAL,) LIQD Place 237 mLs into feeding tube 4 (four) times daily.    [provider]  Nutritional Supplements (FEEDING SUPPLEMENT, OSMOLITE 1.5 CAL,) LIQD Place 80 mL/hr into feeding tube See admin instructions. For 14 hours a day Patient not taking: Reported on 04/27/2016 05/29/15   Jule Ser, DO  nystatin (MYCOSTATIN/NYSTOP) 100000 UNIT/GM POWD Apply 1 g topically 3 (three) times daily. Patient taking differently: Apply 1 g topically daily. Applies to groin 01/24/13   Bonnielee Haff, MD  omeprazole (PRILOSEC) 2 mg/mL SUSP Place 20 mLs (40 mg total) into feeding tube daily. 01/24/13   Bonnielee Haff, MD  polyethylene glycol [facility] / Floria Raveling) packet Place 17 g into feeding  tube daily. Patient taking differently: Place 17 g into feeding tube every evening.  01/24/13   Bonnielee Haff, MD  polyvinyl alcohol (LIQUIFILM TEARS) 1.4 % ophthalmic solution Place 1 drop into both eyes 3 (three) times daily. Patient taking differently: Place 1 drop into both eyes 2 (two) times daily.  01/24/13   Bonnielee Haff, MD  predniSONE (DELTASONE) 10 MG tablet Place 3 tablets (30 mg total) into feeding tube daily with breakfast. 3 tablets (30mg ) daily x 2 days, then 2 tablets (20mg ) daily x 3 days, then 1 tablet(20mg ) daily x 3 days then back to home dose of 15 mg daily. 05/03/16   Eugenie Filler, MD  predniSONE 5 MG/5ML solution Place 5 mLs (5 mg total) into feeding tube daily with breakfast. Resume after taper is done. 05/10/16   Eugenie Filler, MD  predniSONE 5 MG/ML concentrated solution Place 2 mLs (10 mg total) into feeding tube daily with breakfast. Resume after taper. 05/10/16   Eugenie Filler, MD  Saline (SIMPLY SALINE) 0.9 % AERS Place  1 spray into both nostrils as needed (for nasal conjestion).    [provider]  senna (SENOKOT) 8.6 MG TABS tablet Take 2 tablets by mouth at bedtime.     [provider]  sodium phosphate (FLEET) enema Place 1 enema rectally once as needed (for constipation). follow package directions    [provider]  tiZANidine (ZANAFLEX) 2 MG tablet Take 1 tablet (2 mg total) by mouth every 6 (six) hours as needed for muscle spasms. Patient taking differently: Place 2 mg into feeding tube every 6 (six) hours as needed for muscle spasms.  11/25/15   Kathrynn Ducking, MD  vitamin C (ASCORBIC ACID) 500 MG tablet Place 500 mg into feeding tube 2 (two) times daily.    [provider]  Water For Irrigation, Sterile (FREE WATER) SOLN Place 240 mLs into feeding tube every 4 (four) hours. 01/24/13   Bonnielee Haff, MD    Family History Family History  Problem Relation Age of Onset  . Asthma Mother     Social  History Social History   Tobacco Use  . Smoking status: Former Smoker    Types: Cigarettes    Last attempt to quit: 02/02/1999    Years since quitting: 17.9  . Smokeless tobacco: Former Network engineer Use Topics  . Alcohol use: No  . Drug use: Not on file    Comment: Former use     Allergies   Patient has no known allergies.   Review of Systems Review of Systems  All systems reviewed and negative, other than as noted in HPI.  Physical Exam Updated Vital Signs BP 118/86 (BP Location: Right Arm)   Pulse 75   Temp 98.8 F (37.1 C) (Axillary)   Resp 20   SpO2 100%   Physical Exam  Constitutional: No distress.  Laying in bed.  No acute distress.  Nonverbal.  Contractures.  HENT:  Head: Normocephalic and atraumatic.  Eyes: Conjunctivae are normal. Right eye exhibits no discharge. Left eye exhibits no discharge.  Neck: Neck supple.  Cardiovascular: Normal rate, regular rhythm and normal heart sounds. Exam reveals no gallop and no friction rub.  No murmur heard. Pulmonary/Chest: Effort normal and breath sounds normal. No respiratory distress.  Abdominal: Soft. He exhibits no distension. There is no tenderness.  Abdomen is soft and seems nontender.  He has a 59 French catheter type gastrostomy tube in place.  The tubing is cracked a few centimeters below the ports.  Insertion site looks fine.  No drainage noted around the tube.  Genitourinary:  Genitourinary Comments: Foley  Musculoskeletal: He exhibits no edema or tenderness.  Neurological: He is alert.  Skin: Skin is warm and dry.  Psychiatric: He has a normal mood and affect. His behavior is normal. Thought content normal.  Nursing note and vitals reviewed.    ED Treatments / Results  Labs (all labs ordered are listed, but only abnormal results are displayed) Labs Reviewed - No data to display  EKG  EKG Interpretation None       Radiology No results found.  Procedures Gastrostomy tube  replacement Date/Time: 01/22/2017 12:15 PM Performed by: Virgel Manifold, MD Authorized by: Virgel Manifold, MD  Consent: Verbal consent obtained. Consent given by: parent Patient identity confirmed: provided demographic data Preparation: Patient was prepped and draped in the usual sterile fashion. Local anesthesia used: no  Anesthesia: Local anesthesia used: no  Sedation: Patient sedated: no  Patient tolerance: Patient tolerated the procedure well with no immediate complications  Comments: Balloon deflated. Removed easily. New tube placed w/o resistance. Balloon inflated with 10cc saline.     (including critical care time)  Medications Ordered in ED Medications - No data to display   Initial Impression / Assessment and Plan / ED Course  I have reviewed the triage vital signs and the nursing notes.  Pertinent labs & imaging results that were available during my care of the patient were reviewed by me and considered in my medical decision making (see chart for details).     42 year old male with leaking gastrostomy tube.  The tubing is cracked.  Will replace.  Gastrostomy tube was placed without incident.  Aspiration of stomach contents.  Patient tolerated well.  Final Clinical Impressions(s) / ED Diagnoses   Final diagnoses:  Acute management of gastrostomy [facility])    ED Discharge Orders    None       Virgel Manifold, MD 01/22/17 1232

## 2017-07-07 ENCOUNTER — Encounter (HOSPITAL_COMMUNITY): Payer: Self-pay

## 2017-07-07 ENCOUNTER — Inpatient Hospital Stay (HOSPITAL_COMMUNITY)
Admission: EM | Admit: 2017-07-07 | Discharge: 2017-07-10 | DRG: 871 | Disposition: A | Discharge status: Home Health | Payer: Medicare HMO | Attending: Family Medicine | Admitting: Family Medicine

## 2017-07-07 ENCOUNTER — Inpatient Hospital Stay (HOSPITAL_COMMUNITY): Payer: Medicare HMO

## 2017-07-07 DIAGNOSIS — Z87891 Personal history of nicotine dependence: Secondary | ICD-10-CM | POA: Diagnosis not present

## 2017-07-07 DIAGNOSIS — Z7401 Bed confinement status: Secondary | ICD-10-CM

## 2017-07-07 DIAGNOSIS — Z79899 Other long term (current) drug therapy: Secondary | ICD-10-CM | POA: Diagnosis not present

## 2017-07-07 DIAGNOSIS — B964 Proteus (mirabilis) (morganii) as the cause of diseases classified elsewhere: Secondary | ICD-10-CM | POA: Diagnosis present

## 2017-07-07 DIAGNOSIS — G9341 Metabolic encephalopathy: Secondary | ICD-10-CM | POA: Diagnosis present

## 2017-07-07 DIAGNOSIS — Z87440 Personal history of urinary (tract) infections: Secondary | ICD-10-CM | POA: Diagnosis not present

## 2017-07-07 DIAGNOSIS — G35 Multiple sclerosis: Secondary | ICD-10-CM | POA: Diagnosis present

## 2017-07-07 DIAGNOSIS — N319 Neuromuscular dysfunction of bladder, unspecified: Secondary | ICD-10-CM | POA: Diagnosis present

## 2017-07-07 DIAGNOSIS — Z9359 Other cystostomy status: Secondary | ICD-10-CM

## 2017-07-07 DIAGNOSIS — A4159 Other Gram-negative sepsis: Secondary | ICD-10-CM | POA: Diagnosis not present

## 2017-07-07 DIAGNOSIS — F329 Major depressive disorder, single episode, unspecified: Secondary | ICD-10-CM | POA: Diagnosis present

## 2017-07-07 DIAGNOSIS — G40909 Epilepsy, unspecified, not intractable, without status epilepticus: Secondary | ICD-10-CM | POA: Diagnosis present

## 2017-07-07 DIAGNOSIS — E273 Drug-induced adrenocortical insufficiency: Secondary | ICD-10-CM | POA: Diagnosis present

## 2017-07-07 DIAGNOSIS — T380X5A Adverse effect of glucocorticoids and synthetic analogues, initial encounter: Secondary | ICD-10-CM | POA: Diagnosis present

## 2017-07-07 DIAGNOSIS — E2740 Unspecified adrenocortical insufficiency: Secondary | ICD-10-CM | POA: Diagnosis present

## 2017-07-07 DIAGNOSIS — A419 Sepsis, unspecified organism: Secondary | ICD-10-CM | POA: Diagnosis not present

## 2017-07-07 DIAGNOSIS — F419 Anxiety disorder, unspecified: Secondary | ICD-10-CM | POA: Diagnosis present

## 2017-07-07 DIAGNOSIS — Z515 Encounter for palliative care: Secondary | ICD-10-CM | POA: Diagnosis present

## 2017-07-07 DIAGNOSIS — N3000 Acute cystitis without hematuria: Secondary | ICD-10-CM

## 2017-07-07 DIAGNOSIS — F0280 Dementia in other diseases classified elsewhere without behavioral disturbance: Secondary | ICD-10-CM | POA: Diagnosis present

## 2017-07-07 DIAGNOSIS — G8250 Quadriplegia, unspecified: Secondary | ICD-10-CM | POA: Diagnosis present

## 2017-07-07 DIAGNOSIS — Z931 Gastrostomy status: Secondary | ICD-10-CM | POA: Diagnosis not present

## 2017-07-07 DIAGNOSIS — N390 Urinary tract infection, site not specified: Secondary | ICD-10-CM | POA: Diagnosis present

## 2017-07-07 LAB — COMPREHENSIVE METABOLIC PANEL
ALT: 22 U/L (ref 0–44)
AST: 26 U/L (ref 15–41)
Albumin: 3.9 g/dL (ref 3.5–5.0)
Alkaline Phosphatase: 103 U/L (ref 38–126)
Anion gap: 10 (ref 5–15)
BUN: 9 mg/dL (ref 6–20)
CHLORIDE: 95 mmol/L — AB (ref 98–111)
CO2: 25 mmol/L (ref 22–32)
Calcium: 8.8 mg/dL — ABNORMAL LOW (ref 8.9–10.3)
Creatinine, Ser: 0.65 mg/dL (ref 0.61–1.24)
GFR calc Af Amer: >60 mL/min (ref >60)
Glucose, Bld: 73 mg/dL (ref 70–99)
POTASSIUM: 3.8 mmol/L (ref 3.5–5.1)
SODIUM: 130 mmol/L — AB (ref 135–145)
Total Bilirubin: 0.4 mg/dL (ref 0.3–1.2)
Total Protein: 7.5 g/dL (ref 6.5–8.1)

## 2017-07-07 LAB — URINALYSIS, ROUTINE W REFLEX MICROSCOPIC
BACTERIA UA: NONE SEEN
BILIRUBIN URINE: NEGATIVE
Glucose, UA: NEGATIVE mg/dL
HGB URINE DIPSTICK: NEGATIVE
Ketones, ur: 20 mg/dL — AB
Nitrite: POSITIVE — AB
PROTEIN: NEGATIVE mg/dL
Specific Gravity, Urine: 1.014 (ref 1.005–1.030)
pH: 9 — ABNORMAL HIGH (ref 5.0–8.0)

## 2017-07-07 LAB — CBC WITH DIFFERENTIAL/PLATELET
ABS IMMATURE GRANULOCYTES: 0.1 10*3/uL (ref 0.0–0.1)
BASOS ABS: 0.1 10*3/uL (ref 0.0–0.1)
Basophils Relative: 1 %
EOS ABS: 0.3 10*3/uL (ref 0.0–0.7)
Eosinophils Relative: 2 %
HCT: 43.9 % (ref 39.0–52.0)
Hemoglobin: 14.2 g/dL (ref 13.0–17.0)
IMMATURE GRANULOCYTES: 0 %
Lymphocytes Relative: 12 %
Lymphs Abs: 1.6 10*3/uL (ref 0.7–4.0)
MCH: 30.4 pg (ref 26.0–34.0)
MCHC: 32.3 g/dL (ref 30.0–36.0)
MCV: 94 fL (ref 78.0–100.0)
MONOS PCT: 10 %
Monocytes Absolute: 1.4 10*3/uL — ABNORMAL HIGH (ref 0.1–1.0)
NEUTROS ABS: 10 10*3/uL — AB (ref 1.7–7.7)
Neutrophils Relative %: 75 %
PLATELETS: 244 10*3/uL (ref 150–400)
RBC: 4.67 MIL/uL (ref 4.22–5.81)
RDW: 12.5 % (ref 11.5–15.5)
WBC: 13.3 10*3/uL — AB (ref 4.0–10.5)

## 2017-07-07 LAB — I-STAT CG4 LACTIC ACID, ED: LACTIC ACID, VENOUS: 1.44 mmol/L (ref 0.5–1.9)

## 2017-07-07 MED ORDER — LEVETIRACETAM 500 MG PO TABS
1500.0000 mg | ORAL_TABLET | Freq: Two times a day (BID) | ORAL | Status: DC
  Administered 2017-07-08 – 2017-07-09 (×5): 1500 mg via ORAL
  Filled 2017-07-07 (×5): qty 3

## 2017-07-07 MED ORDER — BACITRACIN-NEOMYCIN-POLYMYXIN OINTMENT TUBE
1.0000 | TOPICAL_OINTMENT | Freq: Four times a day (QID) | CUTANEOUS | Status: DC | PRN
Start: 2017-07-07 — End: 2017-07-10
  Administered 2017-07-09: 1 via TOPICAL
  Filled 2017-07-07: qty 14

## 2017-07-07 MED ORDER — POLYVINYL ALCOHOL 1.4 % OP SOLN
1.0000 [drp] | Freq: Two times a day (BID) | OPHTHALMIC | Status: DC
  Administered 2017-07-08 – 2017-07-09 (×3): 1 [drp] via OPHTHALMIC
  Filled 2017-07-07: qty 15

## 2017-07-07 MED ORDER — FLEET ENEMA 7-19 GM/118ML RE ENEM
1.0000 | ENEMA | Freq: Once | RECTAL | Status: DC | PRN
  Filled 2017-07-07: qty 1

## 2017-07-07 MED ORDER — SODIUM CHLORIDE 0.9 % IV SOLN
1.0000 g | Freq: Three times a day (TID) | INTRAVENOUS | Status: DC
  Administered 2017-07-08 – 2017-07-09 (×5): 1 g via INTRAVENOUS
  Filled 2017-07-07 (×6): qty 1

## 2017-07-07 MED ORDER — SENNA 8.6 MG PO TABS
2.0000 | ORAL_TABLET | Freq: Every day | ORAL | Status: DC
Start: 2017-07-07 — End: 2017-07-10
  Administered 2017-07-08 – 2017-07-09 (×3): 17.2 mg via ORAL
  Filled 2017-07-07 (×3): qty 2

## 2017-07-07 MED ORDER — SODIUM CHLORIDE 0.9 % IV SOLN
1.0000 g | Freq: Once | INTRAVENOUS | Status: AC
  Administered 2017-07-07: 1 g via INTRAVENOUS
  Filled 2017-07-07: qty 10

## 2017-07-07 MED ORDER — IPRATROPIUM-ALBUTEROL 0.5-2.5 (3) MG/3ML IN SOLN
3.0000 mL | Freq: Two times a day (BID) | RESPIRATORY_TRACT | Status: DC
  Administered 2017-07-08 – 2017-07-09 (×4): 3 mL via RESPIRATORY_TRACT
  Filled 2017-07-07 (×4): qty 3

## 2017-07-07 MED ORDER — BUPROPION HCL 75 MG PO TABS
75.0000 mg | ORAL_TABLET | Freq: Three times a day (TID) | ORAL | Status: DC
  Administered 2017-07-08 – 2017-07-09 (×7): 75 mg
  Filled 2017-07-07 (×7): qty 1

## 2017-07-07 MED ORDER — PREDNISONE 5 MG/ML PO CONC
10.0000 mg | Freq: Every day | ORAL | Status: DC
  Administered 2017-07-08 – 2017-07-09 (×2): 10 mg
  Filled 2017-07-07 (×3): qty 2

## 2017-07-07 MED ORDER — BACID PO TABS
1.0000 | ORAL_TABLET | Freq: Every day | ORAL | Status: DC
  Administered 2017-07-08 – 2017-07-09 (×2): 1 via ORAL
  Filled 2017-07-07 (×2): qty 1

## 2017-07-07 MED ORDER — ZOLPIDEM TARTRATE 5 MG PO TABS: 5.0000 mg | ORAL_TABLET | Freq: Every evening | ORAL | Status: DC | PRN

## 2017-07-07 MED ORDER — ENOXAPARIN SODIUM 40 MG/0.4ML ~~LOC~~ SOLN
40.0000 mg | SUBCUTANEOUS | Status: DC
  Administered 2017-07-08 – 2017-07-09 (×2): 40 mg via SUBCUTANEOUS
  Filled 2017-07-07 (×2): qty 0.4

## 2017-07-07 MED ORDER — LORATADINE 10 MG PO TABS: 10.0000 mg | ORAL_TABLET | Freq: Every day | ORAL | Status: DC | PRN

## 2017-07-07 MED ORDER — BACLOFEN 20 MG PO TABS
20.0000 mg | ORAL_TABLET | Freq: Four times a day (QID) | ORAL | Status: DC
  Administered 2017-07-08 – 2017-07-09 (×9): 20 mg
  Filled 2017-07-07: qty 2
  Filled 2017-07-07: qty 1
  Filled 2017-07-07 (×3): qty 2
  Filled 2017-07-07: qty 1
  Filled 2017-07-07 (×2): qty 2
  Filled 2017-07-07 (×3): qty 1
  Filled 2017-07-07: qty 2
  Filled 2017-07-07: qty 1
  Filled 2017-07-07: qty 2
  Filled 2017-07-07 (×3): qty 1

## 2017-07-07 MED ORDER — ONDANSETRON HCL 4 MG PO TABS: 4.0000 mg | ORAL_TABLET | Freq: Four times a day (QID) | ORAL | Status: DC | PRN

## 2017-07-07 MED ORDER — BISACODYL 10 MG RE SUPP: 10.0000 mg | Freq: Once | RECTAL | Status: DC | PRN

## 2017-07-07 MED ORDER — DANTROLENE SODIUM 100 MG PO CAPS
200.0000 mg | ORAL_CAPSULE | Freq: Every day | ORAL | Status: DC
  Administered 2017-07-08 – 2017-07-09 (×3): 200 mg
  Filled 2017-07-07 (×3): qty 2

## 2017-07-07 MED ORDER — SALINE SPRAY 0.65 % NA SOLN
1.0000 | NASAL | Status: DC | PRN
  Administered 2017-07-09: 1 via NASAL
  Filled 2017-07-07: qty 44

## 2017-07-07 MED ORDER — SODIUM CHLORIDE 0.9 % IV BOLUS
1000.0000 mL | Freq: Once | INTRAVENOUS | Status: AC
  Administered 2017-07-08: 1000 mL via INTRAVENOUS

## 2017-07-07 MED ORDER — SODIUM CHLORIDE 0.9 % IV BOLUS
500.0000 mL | Freq: Once | INTRAVENOUS | Status: AC
  Administered 2017-07-07: 500 mL via INTRAVENOUS

## 2017-07-07 MED ORDER — POLYETHYLENE GLYCOL 3350 17 G PO PACK
17.0000 g | PACK | Freq: Every evening | ORAL | Status: DC
  Administered 2017-07-08: 17 g
  Filled 2017-07-07: qty 1

## 2017-07-07 MED ORDER — LORAZEPAM 2 MG/ML IJ SOLN: 1.0000 mg | INTRAMUSCULAR | Status: DC | PRN

## 2017-07-07 MED ORDER — SODIUM CHLORIDE 0.9 % IV BOLUS
1000.0000 mL | Freq: Once | INTRAVENOUS | Status: AC
  Administered 2017-07-07: 1000 mL via INTRAVENOUS

## 2017-07-07 MED ORDER — FREE WATER
240.0000 mL | Status: DC
  Administered 2017-07-08 – 2017-07-09 (×11): 240 mL

## 2017-07-07 MED ORDER — HYDROCODONE-ACETAMINOPHEN 7.5-325 MG PO TABS: 1.0000 | ORAL_TABLET | Freq: Every day | ORAL | Status: DC | PRN

## 2017-07-07 MED ORDER — TIZANIDINE HCL 2 MG PO TABS
2.0000 mg | ORAL_TABLET | Freq: Four times a day (QID) | ORAL | Status: DC | PRN
  Administered 2017-07-08 – 2017-07-09 (×2): 2 mg
  Filled 2017-07-07 (×3): qty 1

## 2017-07-07 MED ORDER — HYDROCORTISONE NA SUCCINATE PF 100 MG IJ SOLR
50.0000 mg | Freq: Once | INTRAMUSCULAR | Status: AC
  Administered 2017-07-08: 50 mg via INTRAVENOUS
  Filled 2017-07-07: qty 2

## 2017-07-07 MED ORDER — VITAMIN C 500 MG PO TABS
500.0000 mg | ORAL_TABLET | Freq: Two times a day (BID) | ORAL | Status: DC
  Administered 2017-07-08 – 2017-07-09 (×5): 500 mg
  Filled 2017-07-07 (×5): qty 1

## 2017-07-07 MED ORDER — NALTREXONE HCL 50 MG PO TABS: 4.5000 mg | ORAL_TABLET | Freq: Every day | ORAL | Status: DC

## 2017-07-07 MED ORDER — ADULT MULTIVITAMIN W/MINERALS CH
1.0000 | ORAL_TABLET | Freq: Every day | ORAL | Status: DC
  Administered 2017-07-08 – 2017-07-09 (×2): 1
  Filled 2017-07-07 (×2): qty 1

## 2017-07-07 MED ORDER — ACETAMINOPHEN 325 MG PO TABS
650.0000 mg | ORAL_TABLET | Freq: Four times a day (QID) | ORAL | Status: DC | PRN
Start: 2017-07-07 — End: 2017-07-10

## 2017-07-07 MED ORDER — DIAZEPAM 5 MG PO TABS: 5.0000 mg | ORAL_TABLET | Freq: Every evening | ORAL | Status: DC | PRN

## 2017-07-07 MED ORDER — ONDANSETRON HCL 4 MG/2ML IJ SOLN: 4.0000 mg | Freq: Four times a day (QID) | INTRAMUSCULAR | Status: DC | PRN

## 2017-07-07 MED ORDER — VITAMIN D 1000 UNITS PO TABS
5000.0000 [IU] | ORAL_TABLET | Freq: Every day | ORAL | Status: DC
  Administered 2017-07-08 – 2017-07-09 (×2): 5000 [IU] via ORAL
  Filled 2017-07-07 (×2): qty 5

## 2017-07-07 MED ORDER — JEVITY 1.5 CAL PO LIQD
237.0000 mL | Freq: Four times a day (QID) | ORAL | Status: DC
  Administered 2017-07-08 – 2017-07-09 (×8): 237 mL
  Filled 2017-07-07 (×14): qty 237

## 2017-07-07 MED ORDER — CRANBERRY JUICE POWDER 425 MG PO CAPS: 425.0000 mg | ORAL_CAPSULE | Freq: Two times a day (BID) | ORAL | Status: DC

## 2017-07-07 MED ORDER — NYSTATIN 100000 UNIT/GM EX POWD
1.0000 g | Freq: Every day | CUTANEOUS | Status: DC
  Administered 2017-07-08 – 2017-07-09 (×2): 1 g via TOPICAL
  Filled 2017-07-07: qty 15

## 2017-07-07 MED ORDER — PANTOPRAZOLE SODIUM 40 MG PO PACK
40.0000 mg | PACK | Freq: Every day | ORAL | Status: DC
  Administered 2017-07-08 – 2017-07-09 (×2): 40 mg
  Filled 2017-07-07 (×2): qty 20

## 2017-07-07 MED ORDER — HYDRALAZINE HCL 20 MG/ML IJ SOLN: 5.0000 mg | INTRAMUSCULAR | Status: DC | PRN

## 2017-07-07 NOTE — ED Notes (Signed)
Admitting at bedside 

## 2017-07-07 NOTE — ED Notes (Signed)
Labs labeled by IV and sent to lab.

## 2017-07-07 NOTE — ED Notes (Signed)
MD [place] approved collection of Urine via existing cath tubing and bag sample. Urine collection bag has been emptied and is collecting new urine for future samples.

## 2017-07-07 NOTE — H&P (Signed)
History and Physical    Terry Palmer IRC:[id number] DOB: [birth year] DOA: 07/07/2017  Referring MD/NP/PA:   PCP: [name], MD   Patient coming from:  The patient is coming from home.  At baseline, pt is dependent for most of ADL.  Chief Complaint: AMS  HPI: Terry Palmer is a 42 y.o. 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  At baseline, patient is nonverbal, bed bound. He only can communicate with blinking his eyes. Per EDP, his caregiver and wife states that pt has become less responsive because he does not blink his eyes to them talking to him.  No active respiratory distress, cough, nausea vomiting or diarrhea noted.  He does not seem to have chest pain or abdominal pain per his father.  ED Course: pt was found to have positive urinalysis with11-20 WBC, moderate amount of leukocyte, history appearance, but no bacteria, WBC 13.3, lactic acid 1.11, temperature 99.5, heart rate 99, no tachypnea, oxygen saturation 98% on room air.  Chest x-ray showed low volume, but no infiltration.  Patient is admitted to telemetry bed as inpatient.  Review of Systems: Could not be reviewed due to nonverbal status  Allergy: No Known Allergies  Past Medical History:  Diagnosis Date  . Aspiration pneumonia ([place]) 01/20/2013  . Bladder calculi   . Childhood asthma   . 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[place]) 12/24/2014  . Depression   . Dysphagia   . 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 MS (multiple sclerosis) ([place])   . Neurogenic bladder   . Neuromuscular disorder ([place])    Quadraperesis  . Normocytic anemia 05/28/2011  . Quadriparesis (muscle weakness) ([place]) 03/12/2011  . Quadriplegia and quadriparesis ([place]) 12/24/2014  . Recurrent  upper respiratory infection (URI)   . Recurrent UTI   . Seizure disorder ([place])   . Shortness of breath     Past Surgical History:  Procedure Laterality Date  . GASTROSTOMY  04/16/2011   Procedure: GASTROSTOMY;  Surgeon: Zenovia Jarred, MD;  Location: [place];  Service: General;  Laterality: N/A;  Open G-Tube placement  . LUMBAR PUNCTURE  10/12/2002    Social History:  reports that he quit smoking about 18 years ago. His smoking use included cigarettes. He has quit using smokeless tobacco. He reports that he does not drink alcohol. His drug history is not on file.  Family History:  Family History  Problem Relation Age of Onset  . Asthma Mother      Prior to Admission medications   Medication Sig Start Date End Date Taking? Authorizing Provider  acetaminophen (TYLENOL) 500 MG tablet Take 1 tablet (500 mg total) by mouth every 8 (eight) hours as needed for fever (or pain). Patient taking differently: Take 1,000 mg by mouth 2 (two) times daily.  03/05/15   Debbe Odea, MD  baclofen (LIORESAL) 20 MG tablet Place 1 tablet (20 mg total) into feeding tube 4 (four) times daily. 07/13/15   Kathrynn Ducking, MD  bisacodyl (DULCOLAX) 10 MG suppository Place 10 mg rectally once as needed for moderate constipation.    [provider]  buPROPion (WELLBUTRIN) 75 MG tablet Place 1 tablet (75 mg total) into feeding tube 3 (three) times daily. 01/24/13   Bonnielee Haff, MD  cholecalciferol (VITAMIN D) 1000 UNITS tablet Take 1 tablet (1,000 Units total)  by mouth 2 (two) times daily. Patient taking differently: Give 5,000 Units by tube 2 (two) times daily.  01/24/13   Bonnielee Haff, MD  Cranberry Juice Powder 425 MG CAPS 1 capsule (425 mg total) by PEG Tube route 2 (two) times daily. 01/24/13   Bonnielee Haff, MD  dantrolene (DANTRIUM) 50 MG capsule Give 200 mg by tube at bedtime.     [provider]  diazepam (VALIUM) 5 MG tablet Place 5 mg into feeding tube at bedtime as needed for  anxiety (sleep).     [provider]  fexofenadine (ALLEGRA) 180 MG tablet Place 180 mg into feeding tube daily.    [provider]  Garlic Oil 4193 MG CAPS Give 1,000 mg by tube daily with breakfast.     [provider]  HYDROcodone-acetaminophen (NORCO) 7.5-325 MG per tablet Place 1 tablet into feeding tube daily as needed (pain).     [provider]  ipratropium-albuterol (DUONEB) 0.5-2.5 (3) MG/3ML SOLN Take 3 mLs by nebulization 2 (two) times daily.    [provider]  Lactobacillus (ACIDOPHILUS PO) Place 1 capsule into feeding tube daily.     [provider]  levETIRAcetam (KEPPRA) 500 MG tablet Take 3 tablets (1,500 mg total) by mouth 2 (two) times daily. 05/29/15   Jule Ser, DO  Multiple Vitamin (MULTIVITAMIN WITH MINERALS) TABS tablet Place 1 tablet into feeding tube daily. 01/24/13   Bonnielee Haff, MD  NALTREXONE HCL PO Give 4.5 mg by tube at bedtime.    [provider]  neomycin-bacitracin-polymyxin (NEOSPORIN) [phone] ointment Apply 1 application topically 4 (four) times daily as needed (abraisions, irritations).    [provider]  Nutritional Supplements (FEEDING SUPPLEMENT, JEVITY 1.5 CAL,) LIQD Place 237 mLs into feeding tube 4 (four) times daily.    [provider]  Nutritional Supplements (FEEDING SUPPLEMENT, OSMOLITE 1.5 CAL,) LIQD Place 80 mL/hr into feeding tube See admin instructions. For 14 hours a day Patient not taking: Reported on 04/27/2016 05/29/15   Jule Ser, DO  nystatin (MYCOSTATIN/NYSTOP) 100000 UNIT/GM POWD Apply 1 g topically 3 (three) times daily. Patient taking differently: Apply 1 g topically daily. Applies to groin 01/24/13   Bonnielee Haff, MD  omeprazole (PRILOSEC) 2 mg/mL SUSP Place 20 mLs (40 mg total) into feeding tube daily. 01/24/13   Bonnielee Haff, MD  polyethylene glycol [facility] / Floria Raveling) packet Place 17 g into feeding tube daily. Patient taking  differently: Place 17 g into feeding tube every evening.  01/24/13   Bonnielee Haff, MD  polyvinyl alcohol (LIQUIFILM TEARS) 1.4 % ophthalmic solution Place 1 drop into both eyes 3 (three) times daily. Patient taking differently: Place 1 drop into both eyes 2 (two) times daily.  01/24/13   Bonnielee Haff, MD  predniSONE (DELTASONE) 10 MG tablet Place 3 tablets (30 mg total) into feeding tube daily with breakfast. 3 tablets (30mg ) daily x 2 days, then 2 tablets (20mg ) daily x 3 days, then 1 tablet(20mg ) daily x 3 days then back to home dose of 15 mg daily. 05/03/16   Eugenie Filler, MD  predniSONE 5 MG/5ML solution Place 5 mLs (5 mg total) into feeding tube daily with breakfast. Resume after taper is done. 05/10/16   Eugenie Filler, MD  predniSONE 5 MG/ML concentrated solution Place 2 mLs (10 mg total) into feeding tube daily with breakfast. Resume after taper. 05/10/16   Eugenie Filler, MD  Saline (SIMPLY SALINE) 0.9 % AERS Place 1 spray into both nostrils as needed (  for nasal conjestion).    [provider]  senna (SENOKOT) 8.6 MG TABS tablet Take 2 tablets by mouth at bedtime.     [provider]  sodium phosphate (FLEET) enema Place 1 enema rectally once as needed (for constipation). follow package directions    [provider]  tiZANidine (ZANAFLEX) 2 MG tablet Take 1 tablet (2 mg total) by mouth every 6 (six) hours as needed for muscle spasms. Patient taking differently: Place 2 mg into feeding tube every 6 (six) hours as needed for muscle spasms.  11/25/15   Kathrynn Ducking, MD  vitamin C (ASCORBIC ACID) 500 MG tablet Place 500 mg into feeding tube 2 (two) times daily.    [provider]  Water For Irrigation, Sterile (FREE WATER) SOLN Place 240 mLs into feeding tube every 4 (four) hours. 01/24/13   Bonnielee Haff, MD    Physical Exam: Vitals:   07/07/17 2245 07/07/17 2300 07/07/17 2315 07/08/17 0051  BP: 124/84 123/82 122/82 (!) 132/91  Pulse: 90  90 89 93  Resp: 17 15 15 20   Temp:    99.8 F (37.7 C)  TempSrc:    Oral  SpO2: 99% 99% 99% 99%  Weight:    79.3 kg (174 lb 13.2 oz)   General: Not in acute distress HEENT:       Eyes: PERRL, EOMI, no scleral icterus.       ENT: No discharge from the ears and nose, no pharynx injection, no tonsillar enlargement.        Neck: No JVD, no bruit, no mass felt. Heme: No neck lymph node enlargement. Cardiac: S1/S2, RRR, No murmurs, No gallops or rubs. Respiratory: No rales, wheezing, rhonchi or rubs. GI: Soft, nondistended, nontender, no organomegaly, BS present. GU: No hematuria Ext: No pitting leg edema bilaterally. 2+DP/PT pulse bilaterally. Musculoskeletal: Has muscle spasm. Skin: No rashes.  Neuro: Nonverbal, not follow commands, quadriplegia with extremity spasm Psych: Patient is not psychotic.  Labs on Admission: I have personally reviewed following labs and imaging studies  CBC: Recent Labs  Lab 07/07/17 1826  WBC 13.3*  NEUTROABS 10.0*  HGB 14.2  HCT 43.9  MCV 94.0  PLT 161   Basic Metabolic Panel: Recent Labs  Lab 07/07/17 1826  NA 130*  K 3.8  CL 95*  CO2 25  GLUCOSE 73  BUN 9  CREATININE 0.65  CALCIUM 8.8*   GFR: CrCl cannot be calculated (Unknown ideal weight.). Liver Function Tests: Recent Labs  Lab 07/07/17 1826  AST 26  ALT 22  ALKPHOS 103  BILITOT 0.4  PROT 7.5  ALBUMIN 3.9   No results for input(s): LIPASE, AMYLASE in the last 168 hours. No results for input(s): AMMONIA in the last 168 hours. Coagulation Profile: No results for input(s): INR, PROTIME in the last 168 hours. Cardiac Enzymes: No results for input(s): CKTOTAL, CKMB, CKMBINDEX, TROPONINI in the last 168 hours. BNP (last 3 results) No results for input(s): PROBNP in the last 8760 hours. HbA1C: No results for input(s): HGBA1C in the last 72 hours. CBG: No results for input(s): GLUCAP in the last 168 hours. Lipid Profile: No results for input(s): CHOL, HDL, LDLCALC,  TRIG, CHOLHDL, LDLDIRECT in the last 72 hours. Thyroid Function Tests: No results for input(s): TSH, T4TOTAL, FREET4, T3FREE, THYROIDAB in the last 72 hours. Anemia Panel: No results for input(s): VITAMINB12, FOLATE, FERRITIN, TIBC, IRON, RETICCTPCT in the last 72 hours. Urine analysis:    Component Value Date/Time   COLORURINE AMBER (A)  07/07/2017 2017   APPEARANCEUR HAZY (A) 07/07/2017 2017   LABSPEC 1.014 07/07/2017 2017   PHURINE 9.0 (H) 07/07/2017 2017   GLUCOSEU NEGATIVE 07/07/2017 2017   HGBUR NEGATIVE 07/07/2017 2017   BILIRUBINUR NEGATIVE 07/07/2017 2017   KETONESUR 20 (A) 07/07/2017 2017   PROTEINUR NEGATIVE 07/07/2017 2017   UROBILINOGEN 0.2 10/20/2014 1328   NITRITE POSITIVE (A) 07/07/2017 2017   LEUKOCYTESUR MODERATE (A) 07/07/2017 2017   Sepsis Labs: @LABRCNTIP (procalcitonin:4,lacticidven:4) )No results found for this or any previous visit (from the past 240 hour(s)).   Radiological Exams on Admission: Dg Chest Port 1 View  Result Date: 07/07/2017 CLINICAL DATA:  Cough for 3 days EXAM: PORTABLE CHEST 1 VIEW COMPARISON:  04/29/2016, 02/10/2016 FINDINGS: Low lung volume without acute consolidation or effusion. Normal heart size. No pneumothorax. Lucency beneath the right diaphragm, suspect air-filled dilated bowel. IMPRESSION: 1. Low lung volumes without acute focal opacity 2. Probable dilated air filled bowel beneath the right diaphragm Electronically Signed   By: Donavan Foil M.D.   On: 07/07/2017 19:54     EKG:  Not done in ED, will get one.   Assessment/Plan Principal Problem:   Acute metabolic encephalopathy Active Problems:   Multiple sclerosis (HCC)   UTI (urinary tract infection)   Seizure disorder (HCC)   Quadriplegia and quadriparesis (HCC)   Sepsis ([place])   Suprapubic catheter ([place])   G tube feedings ([place])   Steroid-induced adrenal suppression ([place])   Acute metabolic encephalopathy: Etiology is not clear, possibly due to UTI and sepsis.   Urinalysis is positive for leukocyte and with 11-20 WBC.  Patient has history of positive urine culture with resistant organism.  Will need broader coverage.  Patient meets criteria for sepsis with leukocytosis and tachypnea.  Temperature 99.5, lactic acid is normal.  Currently hemodynamically stable.  -will admit to tele bed as inpt -IV meropenem (patient received 1 dose of Rocephin) -f/u Bx and Ux -will get Procalcitonin and trend lactic acid levels per sepsis protocol. -IVF: 2.5 of NS bolus in ED, followed by 75 cc/h  -Frequent neuro check.  If no improvement with treatment of UTI, will need to consider brain imaging.  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, - has suprapubic cathete - has G tube feedings -Continue muscle relaxer -consult to palliative care team  Seizure -Seizure precaution -When necessary Ativan for seizure -Continue Home medications: Keppra -check Keppra level  Hx of steroid-induced adrenal insufficiency: -Continue home prednisone -Give 50 mg of Solu-Cortef stress dose -Check cortisol level  Depression and anxiety:  -Continue home medications    DVT ppx: SQ Lovenox Code Status: Full code Family Communication: Yes, patient's father  at bed side Disposition Plan:  Anticipate discharge back to previous home environment Consults called:  none Admission status: inpatient/tele    Date of Service 07/08/2017    Ivor Costa [facility] Pager [phone]  If 7PM-7AM, please contact night-coverage [email or web address] Password [email or web address] 07/08/2017, 4:14 AM

## 2017-07-07 NOTE — ED Provider Notes (Signed)
[facility] EMERGENCY DEPARTMENT Provider Note   CSN: [id number] Arrival date & time: 07/07/17  1731     History   Chief Complaint No chief complaint on file.   HPI Terry Palmer is a 42 y.o. male.  Patient has severe MS and is bedridden does not moving his extremities or speak.  He only can communicate with blinking his eyes.  His caregiver and wife states that he has become less responsive because he does not blink his eyes to them talking to him.  Patient has a long history of urinary tract infection  The history is provided by a relative. No language interpreter was used.  Weakness  Primary symptoms include focal weakness. This is a new problem. The current episode started 3 to 5 hours ago. The problem has not changed since onset.Affected Side: Not blinking eyes. There has been no fever (No fever). Pertinent negatives include no shortness of breath. There were no medications administered prior to arrival. Associated medical issues do not include trauma.    Past Medical History:  Diagnosis Date  . Aspiration pneumonia ([place]) 01/20/2013  . Bladder calculi   . Childhood asthma   . 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 Depression   . Dysphagia   . 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 MS (multiple sclerosis) ([place])   . Neurogenic bladder   . Neuromuscular disorder ([place])    Quadraperesis  . Normocytic anemia 05/28/2011  . Quadriparesis (muscle weakness) ([place]) 03/12/2011  . Quadriplegia and quadriparesis ([place]) 12/24/2014  . Recurrent upper respiratory infection (URI)   . Recurrent UTI   . Seizure disorder ([place])   . Shortness of breath     Patient Active Problem List   Diagnosis Date Noted  . Pneumonia due to infectious organism   . 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 Lactic acidosis 04/27/2016  . Acute cystitis with hematuria   . Suprapubic catheter ([place])   . G tube feedings ([place])   . Flexion contractures   .  Encephalopathy   . Bacteremia 05/27/2015  . Sepsis ([place]) 05/25/2015  . Altered mental state 05/25/2015  . Altered mental status 05/25/2015  . Fever, unspecified 03/04/2015  . Quadriplegia and quadriparesis ([place]) 12/24/2014  . 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 Fever   . 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 Protein-calorie malnutrition, severe ([place]) 10/21/2014  . Sepsis secondary to UTI ([place]) 10/20/2014  . Leukocytosis 10/20/2014  . Acute respiratory failure with hypoxia ([place]) 10/20/2014  . Seizure disorder ([place]) 10/20/2014  . Aspiration pneumonitis ([place]) 10/20/2014  . Neurogenic bladder 05/28/2011  . Multiple sclerosis ([place]) 04/20/2011  . FTT (failure to thrive) in adult 04/20/2011  . Sacral decubitus ulcer, stage II 04/20/2011  . Quadriparesis (muscle weakness) ([place]) 03/12/2011    Past Surgical History:  Procedure Laterality Date  . GASTROSTOMY  04/16/2011   Procedure: GASTROSTOMY;  Palmer: Zenovia Jarred, MD;  Location: [facility];  Service: General;  Laterality: N/A;  Open G-Tube placement  . LUMBAR PUNCTURE  10/12/2002        Home Medications    Prior to Admission medications   Medication Sig Start Date End Date Taking? Authorizing Provider  acetaminophen (TYLENOL) 500 MG tablet Take 1 tablet (500 mg total) by mouth every 8 (eight) hours as needed for fever (or pain). Patient taking differently: Take 1,000 mg by mouth 2 (two) times daily.  03/05/15   Debbe Odea, MD  baclofen (LIORESAL) 20 MG tablet Place 1  tablet (20 mg total) into feeding tube 4 (four) times daily. 07/13/15   Kathrynn Ducking, MD  bisacodyl (DULCOLAX) 10 MG suppository Place 10 mg rectally once as needed for moderate constipation.    [provider]  buPROPion (WELLBUTRIN) 75 MG tablet Place 1 tablet (75 mg total) into feeding tube 3 (three) times daily. 01/24/13   Bonnielee Haff, MD  cholecalciferol (VITAMIN D) 1000 UNITS tablet Take 1 tablet (1,000 Units total) by mouth 2  (two) times daily. Patient taking differently: Give 5,000 Units by tube 2 (two) times daily.  01/24/13   Bonnielee Haff, MD  Cranberry Juice Powder 425 MG CAPS 1 capsule (425 mg total) by PEG Tube route 2 (two) times daily. 01/24/13   Bonnielee Haff, MD  dantrolene (DANTRIUM) 50 MG capsule Give 200 mg by tube at bedtime.     [provider]  diazepam (VALIUM) 5 MG tablet Place 5 mg into feeding tube at bedtime as needed for anxiety (sleep).     [provider]  fexofenadine (ALLEGRA) 180 MG tablet Place 180 mg into feeding tube daily.    [provider]  Garlic Oil 2979 MG CAPS Give 1,000 mg by tube daily with breakfast.     [provider]  HYDROcodone-acetaminophen (NORCO) 7.5-325 MG per tablet Place 1 tablet into feeding tube daily as needed (pain).     [provider]  ipratropium-albuterol (DUONEB) 0.5-2.5 (3) MG/3ML SOLN Take 3 mLs by nebulization 2 (two) times daily.    [provider]  Lactobacillus (ACIDOPHILUS PO) Place 1 capsule into feeding tube daily.     [provider]  levETIRAcetam (KEPPRA) 500 MG tablet Take 3 tablets (1,500 mg total) by mouth 2 (two) times daily. 05/29/15   Jule Ser, DO  Multiple Vitamin (MULTIVITAMIN WITH MINERALS) TABS tablet Place 1 tablet into feeding tube daily. 01/24/13   Bonnielee Haff, MD  NALTREXONE HCL PO Give 4.5 mg by tube at bedtime.    [provider]  neomycin-bacitracin-polymyxin (NEOSPORIN) [phone] ointment Apply 1 application topically 4 (four) times daily as needed (abraisions, irritations).    [provider]  Nutritional Supplements (FEEDING SUPPLEMENT, JEVITY 1.5 CAL,) LIQD Place 237 mLs into feeding tube 4 (four) times daily.    [provider]  Nutritional Supplements (FEEDING SUPPLEMENT, OSMOLITE 1.5 CAL,) LIQD Place 80 mL/hr into feeding tube See admin instructions. For 14 hours a day Patient not taking: Reported on 04/27/2016 05/29/15    Jule Ser, DO  nystatin (MYCOSTATIN/NYSTOP) 100000 UNIT/GM POWD Apply 1 g topically 3 (three) times daily. Patient taking differently: Apply 1 g topically daily. Applies to groin 01/24/13   Bonnielee Haff, MD  omeprazole (PRILOSEC) 2 mg/mL SUSP Place 20 mLs (40 mg total) into feeding tube daily. 01/24/13   Bonnielee Haff, MD  polyethylene glycol [facility] / Floria Raveling) packet Place 17 g into feeding tube daily. Patient taking differently: Place 17 g into feeding tube every evening.  01/24/13   Bonnielee Haff, MD  polyvinyl alcohol (LIQUIFILM TEARS) 1.4 % ophthalmic solution Place 1 drop into both eyes 3 (three) times daily. Patient taking differently: Place 1 drop into both eyes 2 (two) times daily.  01/24/13   Bonnielee Haff, MD  predniSONE (DELTASONE) 10 MG tablet Place 3 tablets (30 mg total) into feeding tube daily with breakfast. 3 tablets (30mg ) daily x 2 days, then 2 tablets (20mg ) daily x 3 days, then 1 tablet(20mg ) daily x 3 days then back to home dose of 15 mg daily. 05/03/16  Eugenie Filler, MD  predniSONE 5 MG/5ML solution Place 5 mLs (5 mg total) into feeding tube daily with breakfast. Resume after taper is done. 05/10/16   Eugenie Filler, MD  predniSONE 5 MG/ML concentrated solution Place 2 mLs (10 mg total) into feeding tube daily with breakfast. Resume after taper. 05/10/16   Eugenie Filler, MD  Saline (SIMPLY SALINE) 0.9 % AERS Place 1 spray into both nostrils as needed (for nasal conjestion).    [provider]  senna (SENOKOT) 8.6 MG TABS tablet Take 2 tablets by mouth at bedtime.     [provider]  sodium phosphate (FLEET) enema Place 1 enema rectally once as needed (for constipation). follow package directions    [provider]  tiZANidine (ZANAFLEX) 2 MG tablet Take 1 tablet (2 mg total) by mouth every 6 (six) hours as needed for muscle spasms. Patient taking differently: Place 2 mg into feeding tube every 6 (six) hours as needed for  muscle spasms.  11/25/15   Kathrynn Ducking, MD  vitamin C (ASCORBIC ACID) 500 MG tablet Place 500 mg into feeding tube 2 (two) times daily.    [provider]  Water For Irrigation, Sterile (FREE WATER) SOLN Place 240 mLs into feeding tube every 4 (four) hours. 01/24/13   Bonnielee Haff, MD    Family History Family History  Problem Relation Age of Onset  . Asthma Mother     Social History Social History   Tobacco Use  . Smoking status: Former Smoker    Types: Cigarettes    Last attempt to quit: 02/02/1999    Years since quitting: 18.4  . Smokeless tobacco: Former Network engineer Use Topics  . Alcohol use: No  . Drug use: Not on file    Comment: Former use     Allergies   Patient has no known allergies.   Review of Systems Review of Systems  Unable to perform ROS: Mental status change  Respiratory: Negative for shortness of breath.   Neurological: Positive for focal weakness and weakness.     Physical Exam Updated Vital Signs BP 111/68   Pulse 95   Temp 99.5 F (37.5 C) (Rectal)   Resp 16   SpO2 98%   Physical Exam  Constitutional: He appears well-developed.  HENT:  Head: Normocephalic.  Eyes: Pupils are equal, round, and reactive to light. No scleral icterus.  Neck: Neck supple. No thyromegaly present.  Cardiovascular: Normal rate and regular rhythm. Exam reveals no gallop and no friction rub.  No murmur heard. Pulmonary/Chest: No stridor. He has no wheezes. He has no rales. He exhibits no tenderness.  Abdominal: He exhibits no distension. There is no tenderness. There is no rebound.  Musculoskeletal: He exhibits no edema.  Patient with contractures all over unable to move arms and legs at all this is her normal  Lymphadenopathy:    He has no cervical adenopathy.  Neurological: He exhibits normal muscle tone. Coordination normal.  Patient not responding at all.  His normal responses to blink eyes to question  Skin: No rash noted. No erythema.       ED Treatments / Results  Labs (all labs ordered are listed, but only abnormal results are displayed) Labs Reviewed  CBC WITH DIFFERENTIAL/PLATELET - Abnormal; Notable for the following components:      Result Value   WBC 13.3 (*)    Neutro Abs 10.0 (*)    Monocytes Absolute 1.4 (*)    All other components  within normal limits  COMPREHENSIVE METABOLIC PANEL - Abnormal; Notable for the following components:   Sodium 130 (*)    Chloride 95 (*)    Calcium 8.8 (*)    All other components within normal limits  URINALYSIS, ROUTINE W REFLEX MICROSCOPIC - Abnormal; Notable for the following components:   Color, Urine AMBER (*)    APPearance HAZY (*)    pH 9.0 (*)    Ketones, ur 20 (*)    Nitrite POSITIVE (*)    Leukocytes, UA MODERATE (*)    All other components within normal limits  URINE CULTURE  I-STAT CG4 LACTIC ACID, ED  I-STAT CG4 LACTIC ACID, ED    EKG None  Radiology Dg Chest Port 1 View  Result Date: 07/07/2017 CLINICAL DATA:  Cough for 3 days EXAM: PORTABLE CHEST 1 VIEW COMPARISON:  04/29/2016, 02/10/2016 FINDINGS: Low lung volume without acute consolidation or effusion. Normal heart size. No pneumothorax. Lucency beneath the right diaphragm, suspect air-filled dilated bowel. IMPRESSION: 1. Low lung volumes without acute focal opacity 2. Probable dilated air filled bowel beneath the right diaphragm Electronically Signed   By: Donavan Foil M.D.   On: 07/07/2017 19:54    Procedures Procedures (including critical care time)  Medications Ordered in ED Medications  sodium chloride 0.9 % bolus 1,000 mL (1,000 mLs Intravenous New Bag/Given 07/07/17 2113)  sodium chloride 0.9 % bolus 500 mL (0 mLs Intravenous Stopped 07/07/17 2034)  cefTRIAXone (ROCEPHIN) 1 g in sodium chloride 0.9 % 100 mL IVPB (1 g Intravenous New Bag/Given 07/07/17 2119)     Initial Impression / Assessment and Plan / ED Course  I have reviewed the triage vital signs and the nursing  notes.  Pertinent labs & imaging results that were available during my care of the patient were reviewed by me and considered in my medical decision making (see chart for details).     Patient with severe MS.  This is normal.  He also has a urinary tract infection will be admitted and treated for this  Final Clinical Impressions(s) / ED Diagnoses   Final diagnoses:  Acute cystitis without hematuria    ED Discharge Orders    None       Milton Ferguson, MD 07/11/17 1023

## 2017-07-07 NOTE — Progress Notes (Addendum)
Pharmacy Antibiotic Note  Terry Palmer is a 42 y.o. male admitted on 07/07/2017 with recurrent UTI w/ h/o resistance.  Pharmacy has been consulted for Merrem dosing.  Plan: Merrem 1g IV Q8H.  Temp (24hrs), Avg:99.5 F (37.5 C), Min:99.5 F (37.5 C), Max:99.5 F (37.5 C)  Recent Labs  Lab 07/07/17 1826 07/07/17 2043  WBC 13.3*  --   CREATININE 0.65  --   LATICACIDVEN  --  1.44     No Known Allergies   Thank you for allowing pharmacy to be a part of this patient's care.  Wynona Neat, PharmD, BCPS  07/07/2017 11:17 PM

## 2017-07-07 NOTE — ED Triage Notes (Signed)
Pt arrived via [facility] from home c/o failure to thrive.  EMS states patient was at his best 18 hours ago and now doesn't blink.

## 2017-07-08 ENCOUNTER — Encounter (HOSPITAL_COMMUNITY): Payer: Self-pay | Admitting: General Practice

## 2017-07-08 ENCOUNTER — Other Ambulatory Visit: Payer: Self-pay

## 2017-07-08 DIAGNOSIS — G9341 Metabolic encephalopathy: Secondary | ICD-10-CM

## 2017-07-08 DIAGNOSIS — Z515 Encounter for palliative care: Secondary | ICD-10-CM

## 2017-07-08 LAB — BASIC METABOLIC PANEL
ANION GAP: 11 (ref 5–15)
BUN: 5 mg/dL — ABNORMAL LOW (ref 6–20)
CALCIUM: 8.7 mg/dL — AB (ref 8.9–10.3)
CO2: 20 mmol/L — ABNORMAL LOW (ref 22–32)
Chloride: 107 mmol/L (ref 98–111)
Creatinine, Ser: 0.51 mg/dL — ABNORMAL LOW (ref 0.61–1.24)
Glucose, Bld: 92 mg/dL (ref 70–99)
POTASSIUM: 4.2 mmol/L (ref 3.5–5.1)
SODIUM: 138 mmol/L (ref 135–145)

## 2017-07-08 LAB — CBC
HCT: 44.7 % (ref 39.0–52.0)
HEMOGLOBIN: 14.5 g/dL (ref 13.0–17.0)
MCH: 30.5 pg (ref 26.0–34.0)
MCHC: 32.4 g/dL (ref 30.0–36.0)
MCV: 94.1 fL (ref 78.0–100.0)
Platelets: 237 10*3/uL (ref 150–400)
RBC: 4.75 MIL/uL (ref 4.22–5.81)
RDW: 12.5 % (ref 11.5–15.5)
WBC: 8.5 10*3/uL (ref 4.0–10.5)

## 2017-07-08 LAB — LACTIC ACID, PLASMA: Lactic Acid, Venous: 1 mmol/L (ref 0.5–1.9)

## 2017-07-08 LAB — MRSA PCR SCREENING: MRSA BY PCR: NEGATIVE

## 2017-07-08 LAB — PROCALCITONIN: Procalcitonin: <0.1 ng/mL

## 2017-07-08 LAB — CORTISOL-AM, BLOOD: Cortisol - AM: 14.5 ug/dL (ref 6.7–22.6)

## 2017-07-08 LAB — HIV ANTIBODY (ROUTINE TESTING W REFLEX): HIV SCREEN 4TH GENERATION: NONREACTIVE

## 2017-07-08 NOTE — ED Notes (Signed)
Checked midline for patency.  Noted to be positional but flushes well.

## 2017-07-08 NOTE — Progress Notes (Signed)
Patient Demographics:    Terry Palmer, is a 42 y.o. male, DOB - Jul 21, 1975, FUX:[id number]  Admit date - 07/07/2017   Admitting Physician Ivor Costa, MD  Outpatient Primary MD for the patient is [name], MD  LOS - 1   No chief complaint on file.       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 male with medical history significant of severe MS (norverbal, bed-bound for 7 years), quadriplegia, dementia secondary to MS, GERD, depression, recurrent UTI, seizure, dysphasia, steroid-induced adrenal insufficiency, s/p suprapubic catheter, PEG tube, who was admitted on 07/07/17  with worsening mental status   Plan:-  1) Sepsis secondary to urinary source--- patient with history of resistant UTIs, continue IV meropenem pending culture results, lactic acid is not elevated  2)MS-due to advanced multiple sclerosis with quadriplegia and bedbound status-continue supportive care, continue baclofen and dantrolene, as well as Zanaflex  3)Sz DO-no evidence of seizures at this time, continue Keppra, use Ativan as needed  4) dementia--advanced, patient is nonverbal, noncommunicative, supportive care  5) steroid-induced adrenal insufficiency--- received stress dose Solu-Cortef, okay to continue his home dose prednisone  6) social/ethics-patient is a full code, palliative care consult to help delineate goals of care   Code Status : FULL   Disposition Plan  :  Home   Consults  :  Palliative  DVT Prophylaxis  :  Lovenox    Lab Results  Component Value Date   PLT 237 07/08/2017    Inpatient Medications  Scheduled Meds: . baclofen  20 mg Per Tube QID  . buPROPion  75 mg Per Tube TID  . cholecalciferol  5,000 Units Oral Daily  . dantrolene  200 mg Per Tube QHS  . enoxaparin (LOVENOX) injection  40 mg Subcutaneous Q24H  . feeding supplement (JEVITY 1.5 CAL)  237 mL Per Tube QID  . free water  240 mL Per Tube Q4H  . ipratropium-albuterol  3 mL Nebulization BID  . lactobacillus acidophilus  1 tablet Oral Daily  . levETIRAcetam  1,500 mg Oral BID  . multivitamin with minerals  1 tablet Per Tube Daily  . nystatin  1 g Topical Daily  . pantoprazole sodium  40 mg Per Tube Daily  . polyethylene glycol  17 g Per Tube QPM  . polyvinyl alcohol  1 drop Both Eyes BID  . predniSONE  10 mg Per Tube Q breakfast  . senna  2 tablet Oral QHS  . vitamin C  500 mg Per Tube BID   Continuous Infusions: . meropenem (MERREM) IV 1 g (07/08/17 1529)   PRN Meds:.acetaminophen, bisacodyl, diazepam, hydrALAZINE, HYDROcodone-acetaminophen, loratadine, LORazepam, neomycin-bacitracin-polymyxin, ondansetron **OR** ondansetron (ZOFRAN) IV, sodium chloride, sodium phosphate, tiZANidine, zolpidem    Anti-infectives (From admission, onward)   Start  Dose/Rate Route Frequency Ordered Stop   07/08/17 0000  meropenem (MERREM) 1 g in sodium chloride 0.9 % 100 mL IVPB     1 g 200 mL/hr over 30 Minutes Intravenous Every 8 hours 07/07/17 2329     07/07/17 2030  cefTRIAXone (ROCEPHIN) 1 g in sodium chloride 0.9 % 100 mL IVPB     1 g 200 mL/hr over 30 Minutes Intravenous  Once 07/07/17 2024 07/07/17 2149        Objective:   Vitals:   07/08/17 0824 07/08/17 0905 07/08/17 1438 07/08/17 1725  BP:  132/89 132/89 130/76  Pulse:  88 80 96  Resp:  18 18 18   Temp:  98.9 F (37.2 C) 98.9 F (37.2 C) 99.1 F (37.3 C)  TempSrc:  Oral Oral Oral  SpO2: 96% 98%  98%  Weight:   79.3 kg (174  lb 13.2 oz)   Height:   6\' 4"  (1.93 m)     Wt Readings from Last 3 Encounters:  07/08/17 79.3 kg (174 lb 13.2 oz)  04/27/16 80.3 kg (177 lb)  02/12/16 80.5 kg (177 lb 6.4 oz)     Intake/Output Summary (Last 24 hours) at 07/08/2017 1745 Last data filed at 07/08/2017 1416 Gross per 24 hour  Intake 2800 ml  Output 1050 ml  Net 1750 ml     Physical Exam  Gen:-Nonverbal,  HEENT:- [place].AT, No sclera icterus Neck-Supple Neck,No JVD,.  Lungs-  CTAB , fair air movement CV- S1, S2 normal Abd-  +ve B.Sounds, Abd Soft, No tenderness, site is clean dry and intact, Extremity/Skin:-Warm and dry with good pulses Psych-unable to assess as patient is nonverbal/noncommunicative GU-suprapubic catheter Neuro- Nonverbal, not follow commands, quadriplegia with extremity contractures and muscle wasting/atrophy     Data Review:   Micro Results Recent Results (from the past 240 hour(s))  Urine culture     Status: Abnormal (Preliminary result)   Collection Time: 07/07/17  6:30 PM  Result Value Ref Range Status   Specimen Description URINE, RANDOM  Final   Special Requests   Final    NONE Performed at [facility], [address] [place], [place] [address]    Culture >=100,000 COLONIES/mL PROTEUS MIRABILIS (A)  Final   Report Status PENDING  Incomplete  MRSA PCR Screening     Status: None   Collection Time: 07/08/17  2:00 AM  Result Value Ref Range Status   MRSA by PCR NEGATIVE NEGATIVE Final    Comment:        The GeneXpert MRSA Assay (FDA approved for NASAL specimens only), is one component of a comprehensive MRSA colonization surveillance program. It is not intended to diagnose MRSA infection nor to guide or monitor treatment for MRSA infections. Performed at [facility], [place] [address] [place], [place] [address]     Radiology Reports Dg Chest Port 1 View  Result Date: 07/07/2017 CLINICAL DATA:  Cough for 3 days EXAM: PORTABLE CHEST 1 VIEW COMPARISON:   04/29/2016, 02/10/2016 FINDINGS: Low lung volume without acute consolidation or effusion. Normal heart size. No pneumothorax. Lucency beneath the right diaphragm, suspect air-filled dilated bowel. IMPRESSION: 1. Low lung volumes without acute focal opacity 2. Probable dilated air filled bowel beneath the right diaphragm Electronically Signed   By: Donavan Foil M.D.   On: 07/07/2017 19:54     CBC Recent Labs  Lab 07/07/17 1826 07/08/17 0610  WBC 13.3* 8.5  HGB 14.2 14.5  HCT 43.9 44.7  PLT 244 237  MCV 94.0 94.1  MCH 30.4 30.5  MCHC 32.3 32.4  RDW 12.5 12.5  LYMPHSABS 1.6  --   MONOABS 1.4*  --   EOSABS 0.3  --   BASOSABS 0.1  --     Chemistries  Recent Labs  Lab 07/07/17 1826 07/08/17 0610  NA 130* 138  K 3.8 4.2  CL 95* 107  CO2 25 20*  GLUCOSE 73 92  BUN 9 5*  CREATININE 0.65 0.51*  CALCIUM 8.8* 8.7*  AST 26  --   ALT 22  --   ALKPHOS 103  --   BILITOT 0.4  --    ------------------------------------------------------------------------------------------------------------------ No results for input(s): CHOL, HDL, LDLCALC, TRIG, CHOLHDL, LDLDIRECT in the last 72 hours.  No results found for: HGBA1C ------------------------------------------------------------------------------------------------------------------ No results for input(s): TSH, T4TOTAL, T3FREE, THYROIDAB in the last 72 hours.  Invalid input(s): FREET3 ------------------------------------------------------------------------------------------------------------------ No results for input(s): VITAMINB12, FOLATE, FERRITIN, TIBC, IRON, RETICCTPCT in the last 72 hours.  Coagulation profile No results for input(s): INR, PROTIME in the last 168 hours.  No results for input(s): DDIMER in the last 72 hours.  Cardiac Enzymes No results for input(s): CKMB, TROPONINI, MYOGLOBIN in the last 168 hours.  Invalid input(s):  CK ------------------------------------------------------------------------------------------------------------------ No results found for: BNP   Roxan Hockey M.D on 07/08/2017 at 5:45 PM  Between 7am to 7pm - Pager - [phone]  After 7pm go to [email or web address] - password [email or web address]  [facility] -  Office  [phone]   Voice Recognition Viviann Spare dictation system was used to create this note, attempts have been made to correct errors. Please contact the author with questions and/or clarifications.

## 2017-07-08 NOTE — Progress Notes (Signed)
No charge note.  Reviewed chart, examined patient, left a voice mail for his wife to call me.  I will continue to attempt to reach the family.  Bedside RN will page me if family comes in so that goals of care can be discussed.     Florentina Jenny, PA-C Palliative Medicine Pager: [phone]

## 2017-07-08 NOTE — Consult Note (Signed)
Consultation Note Date: 07/08/2017   Patient Name: Terry Palmer  DOB: 1975/08/11  MRN: [id number]  Age / Sex: 42 y.o., male  PCP: [name], MD Referring Physician: Roxan Hockey, MD  Reason for Consultation: Establishing goals of care and Psychosocial/spiritual support  HPI/Patient Profile: 42 y.o. male  with past medical history of advanced multiple sclerosis, quadriplegia, dementia, dysphagia, aspiration pneumonia, recurrent UTI (suprapubic catheter in place), reportedly nonverbal and bedbound x 7 years who was admitted on 07/07/2017 with altered mental status and presumed UTI.   Clinical Assessment and Goals of Care:  I have reviewed medical records including EPIC notes, labs and imaging, received report from the care team assessed the patient and then met at the bedside along with his wife to discuss diagnosis prognosis, GOC, EOL wishes, disposition and options.  I introduced Palliative Medicine as specialized medical care for people living with serious illness. It focuses on providing relief from the symptoms and stress of a serious illness. The goal is to improve quality of life for both the patient and the family.  We discussed a brief life review of the patient. He was a plummer.  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  He and his wife have a son (84 yo) and a daughter (39 yo).  He is originally from [place] and his wife is from [place].  He moved to [place] to be closer to his father.  He has been suffering with MS for 15 years.  It progressed very quickly and in 2012 he lost his voice, received a PEG and became bed bound.   He has hired care givers at home 5 days a week.  Currently he communicates by blinking.  Per wife if you ask if he is in pain and he BLINKS ONCE it means YES.  If he just stares straight ahead it means no.  His wife talks about what a sweet man he used to be  and states she misses him very much.    We talked about code status in the event that he had an arrest.  She feels that she wants his death to be natural and comfortable.  She will consider the code status question further.   Primary Decision Maker:  NEXT OF KIN wife.    SUMMARY OF RECOMMENDATIONS    PMT will check in with wife tomorrow to follow up on code status.  Hard Choices booklet given to wife.  Code Status/Advance Care Planning:  Full code  Prognosis:   Difficult to determine.     Discharge Planning: To Be Determined  Likely home with home health.      Primary Diagnoses: Present on Admission: . Multiple sclerosis ([place]) . Quadriplegia and quadriparesis ([name]) . Acute metabolic encephalopathy . Sepsis ([place]) . UTI (urinary tract infection) . Steroid-induced adrenal suppression ([facility])   I have reviewed the medical record, interviewed the patient and family, and examined the patient. The following aspects are pertinent.  Past Medical History:  Diagnosis Date  .  Aspiration pneumonia ([place]) 01/20/2013  . Bladder calculi   . Childhood asthma   . 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 Depression   . Dysphagia   . 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 MS (multiple sclerosis) ([place])   . Neurogenic bladder   . Neuromuscular disorder ([place])    Quadraperesis  . Normocytic anemia 05/28/2011  . Quadriparesis (muscle weakness) ([place]) 03/12/2011  . Quadriplegia and quadriparesis ([place]) 12/24/2014  . Recurrent upper respiratory infection (URI)   . Recurrent UTI   . Seizure disorder ([place])   . Shortness of breath    Social History   Socioeconomic History  . Marital status: Married    Spouse name: Not on file  . Number of children: 2  . Years of education: Not on file  . Highest education level: Not on file  Occupational History  . Not on file  Social Needs  . Financial resource strain: Not on file  . Food insecurity:    Worry: Not on file    Inability:  Not on file  . Transportation needs:    Medical: Not on file    Non-medical: Not on file  Tobacco Use  . Smoking status: Former Smoker    Types: Cigarettes    Last attempt to quit: 02/02/1999    Years since quitting: 18.4  . Smokeless tobacco: Former Network engineer and Sexual Activity  . Alcohol use: No  . Drug use: Not on file    Comment: Former use  . Sexual activity: Never  Lifestyle  . Physical activity:    Days per week: Not on file    Minutes per session: Not on file  . Stress: Not on file  Relationships  . Social connections:    Talks on phone: Not on file    Gets together: Not on file    Attends religious service: Not on file    Active member of club or organization: Not on file    Attends meetings of clubs or organizations: Not on file    Relationship status: Not on file  Other Topics Concern  . Not on file  Social History Narrative   The patient currently lives at home with his wife and kids, who help him with his ADL's.  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  The patient currently has [facility], and is in the process of reapplying for Medicaid (12/2010).    Westyn Driggers (cell) [phone]; (home) [phone],     Josephmichael Lisenbee (cell) [phone] (home) [phone]   Family History  Problem Relation Age of Onset  . Asthma Mother    Scheduled Meds: . baclofen  20 mg Per Tube QID  . buPROPion  75 mg Per Tube TID  . cholecalciferol  5,000 Units Oral Daily  . dantrolene  200 mg Per Tube QHS  . enoxaparin (LOVENOX) injection  40 mg Subcutaneous Q24H  . feeding supplement (JEVITY 1.5 CAL)  237 mL Per Tube QID  . free water  240 mL Per Tube Q4H  . ipratropium-albuterol  3 mL Nebulization BID  . lactobacillus acidophilus  1 tablet Oral Daily  . levETIRAcetam  1,500 mg Oral BID  . multivitamin with minerals  1 tablet Per Tube Daily  . naltrexone  25 mg Per Tube QHS  .  nystatin  1 g Topical Daily  . pantoprazole sodium  40 mg Per Tube Daily  . polyethylene glycol  17 g Per Tube QPM  . polyvinyl alcohol  1 drop Both Eyes BID  . predniSONE  10 mg Per Tube Q breakfast  . senna  2 tablet Oral QHS  . vitamin C  500 mg Per Tube BID   Continuous Infusions: . meropenem (MERREM) IV 1 g (07/08/17 0836)   PRN Meds:.acetaminophen, bisacodyl, diazepam, hydrALAZINE, HYDROcodone-acetaminophen, loratadine, LORazepam, neomycin-bacitracin-polymyxin, ondansetron **OR** ondansetron (ZOFRAN) IV, sodium chloride, sodium phosphate, tiZANidine, zolpidem No Known Allergies Review of Systems patient is non-verbal.  Physical Exam  42 yo disabled male lying in bed, nonverbal, eyes open, slightly diaphoretic CV rrr no frank m/r/g Resp slightly coarse breath sounds, patient unable to cough, no distress Abdomen PEG in place, no obvious erythema or drainage.  PEG not in use  Ext.  No edema in LE, contracted.  Vital Signs: BP 132/89 (BP Location: Right Arm)   Pulse 88   Temp 98.9 F (37.2 C) (Oral)   Resp 18   Wt 79.3 kg (174 lb 13.2 oz)   SpO2 98%   BMI 21.28 kg/m  Pain Scale: PAINAD       SpO2: SpO2: 98 % O2 Device:SpO2: 98 % O2 Flow Rate: .   IO: Intake/output summary:   Intake/Output Summary (Last 24 hours) at 07/08/2017 1108 Last data filed at 07/08/2017 0600 Gross per 24 hour  Intake 2800 ml  Output -  Net 2800 ml    LBM:   Baseline Weight: Weight: 79.3 kg (174 lb 13.2 oz) Most recent weight: Weight: 79.3 kg (174 lb 13.2 oz)     Palliative Assessment/Data: 10-20%     Time In: 2:00 Time Out: 3:00 Time Total: 60 min. Greater than 50%  of this time was spent counseling and coordinating care related to the above assessment and plan.  Signed by: Florentina Jenny, PA-C Palliative Medicine Pager: [phone]  Please contact Palliative Medicine Team phone at [phone] for questions and concerns.  For individual provider: See  Shea Evans

## 2017-07-09 LAB — BLOOD CULTURE ID PANEL (REFLEXED)
ACINETOBACTER BAUMANNII: NOT DETECTED
CANDIDA KRUSEI: NOT DETECTED
CANDIDA PARAPSILOSIS: NOT DETECTED
CANDIDA TROPICALIS: NOT DETECTED
Candida albicans: NOT DETECTED
Candida glabrata: NOT DETECTED
ENTEROCOCCUS SPECIES: NOT DETECTED
Enterobacter cloacae complex: NOT DETECTED
Enterobacteriaceae species: NOT DETECTED
Escherichia coli: NOT DETECTED
HAEMOPHILUS INFLUENZAE: NOT DETECTED
KLEBSIELLA OXYTOCA: NOT DETECTED
Klebsiella pneumoniae: NOT DETECTED
Listeria monocytogenes: NOT DETECTED
METHICILLIN RESISTANCE: DETECTED — AB
Neisseria meningitidis: NOT DETECTED
PROTEUS SPECIES: NOT DETECTED
PSEUDOMONAS AERUGINOSA: NOT DETECTED
SERRATIA MARCESCENS: NOT DETECTED
STAPHYLOCOCCUS AUREUS BCID: NOT DETECTED
STAPHYLOCOCCUS SPECIES: DETECTED — AB
STREPTOCOCCUS PNEUMONIAE: NOT DETECTED
Streptococcus agalactiae: NOT DETECTED
Streptococcus pyogenes: NOT DETECTED
Streptococcus species: NOT DETECTED

## 2017-07-09 LAB — LEVETIRACETAM LEVEL: LEVETIRACETAM: 39.3 ug/mL (ref 10.0–40.0)

## 2017-07-09 LAB — URINE CULTURE

## 2017-07-09 MED ORDER — CEPHALEXIN 250 MG/5ML PO SUSR
500.0000 mg | Freq: Three times a day (TID) | ORAL | Status: DC
  Administered 2017-07-10: 500 mg via ORAL
  Filled 2017-07-09 (×3): qty 10

## 2017-07-09 MED ORDER — CEPHALEXIN 250 MG/5ML PO SUSR: 500.0000 mg | Freq: Three times a day (TID) | ORAL | 0 refills | Status: DC

## 2017-07-09 NOTE — Plan of Care (Signed)
  Problem: Education: Goal: Knowledge of General Education information will improve Outcome: Progressing   Problem: Health Behavior/Discharge Planning: Goal: Ability to manage health-related needs will improve Outcome: Progressing   Problem: Clinical Measurements: Goal: Ability to maintain clinical measurements within normal limits will improve Outcome: Progressing Goal: Will remain free from infection Outcome: Progressing Goal: Diagnostic test results will improve Outcome: Progressing Goal: Respiratory complications will improve Outcome: Progressing Goal: Cardiovascular complication will be avoided Outcome: Progressing   Problem: Activity: Goal: Risk for activity intolerance will decrease Outcome: Progressing   Problem: Nutrition: Goal: Adequate nutrition will be maintained Outcome: Progressing   Problem: Coping: Goal: Level of anxiety will decrease Outcome: Progressing   Problem: Elimination: Goal: Will not experience complications related to bowel motility Outcome: Progressing Goal: Will not experience complications related to urinary retention Outcome: Progressing   Problem: Pain Managment: Goal: General experience of comfort will improve Outcome: Progressing   Problem: Skin Integrity: Goal: Risk for impaired skin integrity will decrease Outcome: Progressing

## 2017-07-09 NOTE — Discharge Summary (Signed)
Terry Palmer, is a 42 y.o. male  DOB [birth year]  MRN [id number].  Admission date:  07/07/2017  Admitting Physician  Ivor Costa, MD  Discharge Date:  07/09/2017   Primary MD  [name], MD  Recommendations for primary care physician for things to follow:   Multiple sclerosis with neuromuscular deficits and bedbound status and Proteus UTI----  1)resume routine care 2)Frequent turns and decubitus ulcers (Bed sores) precautions as previously advised 3)Tube feeding with water flushes as advised 4)Cefazolin (Keflex) antibiotic 3 times a day for 5 days for Proteus Mirabilis urinary tract infection  Admission Diagnosis  Acute cystitis without hematuria [Q68.34] Acute metabolic encephalopathy [H96.22]   Discharge Diagnosis  Acute cystitis without hematuria [W97.98] Acute metabolic encephalopathy [X21.19]    Principal Problem:   Acute metabolic encephalopathy Active Problems:   Multiple sclerosis ([place])   UTI (urinary tract infection)   Seizure disorder ([place])   Quadriplegia and quadriparesis ([place])   Sepsis ([place])   Suprapubic catheter ([place])   G tube feedings ([place])   Steroid-induced adrenal suppression ([place])   Palliative care encounter      Past Medical History:  Diagnosis Date  . Aspiration pneumonia ([place]) 01/20/2013  . Bladder calculi   . Childhood asthma   . 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 Depression   . Dysphagia   . 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 MS (multiple sclerosis) ([place])   . Neurogenic bladder   . Neuromuscular disorder ([place])    Quadraperesis  . Normocytic anemia 05/28/2011  . Quadriparesis (muscle weakness) ([place]) 03/12/2011  . Quadriplegia and quadriparesis ([place]) 12/24/2014  . Recurrent upper respiratory infection (URI)   . Recurrent UTI   . Seizure disorder ([place])   . Shortness of breath     Past Surgical History:  Procedure Laterality Date    . GASTROSTOMY  04/16/2011   Procedure: GASTROSTOMY;  Surgeon: Zenovia Jarred, MD;  Location: [facility];  Service: General;  Laterality: N/A;  Open G-Tube placement  . LUMBAR PUNCTURE  10/12/2002       HPI  from the history and physical done on the day of admission:    Chief Complaint: AMS  HPI: Terry Palmer is a 42 y.o. 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  At baseline, patient is nonverbal, bed bound.He only can communicate with blinking his eyes. Per EDP, his caregiver and wife states that pt has become less responsive because he does not blink his eyes to them talking to him.  No active respiratory distress, cough, nausea vomiting or diarrhea noted.  He does not seem to have chest pain or abdominal pain per his father.  ED Course: pt was found to have positive urinalysis with11-20 WBC, moderate amount of leukocyte, history appearance, but no bacteria, WBC 13.3, lactic acid 1.11, temperature 99.5, heart rate 99,  no tachypnea, oxygen saturation 98% on room air.  Chest x-ray showed low volume, but no infiltration.  Patient is admitted to telemetry bed as inpatient.   Hospital Course:    Brief Summary 42 y.o.malewith medical history significant ofsevere MS (norverbal, bed-bound for 7 years),quadriplegia, dementia secondary to MS, GERD, depression, recurrent UTI, seizure, dysphasia, steroid-induced adrenal insufficiency,s/psuprapubic catheter, PEG tube, who was admitted on 07/07/17  with worsening mental status   Plan:-  1)Sepsis secondary to Proteus Mirabilis UTI-  patient with history of resistant UTIs, initially treated with  IV meropenem, okay to discharge on Keflex per sensitivity report .  lactic acid is not  elevated  2)MS-due to advanced multiple sclerosis with quadriplegia and bedbound status-continue supportive care, continue baclofen and dantrolene, as well as Zanaflex  3)Sz DO-no evidence of seizures at this time, continue Keppra, use Ativan as needed  4) dementia--advanced, patient is nonverbal, noncommunicative, supportive care  5) steroid-induced adrenal insufficiency--- received stress dose Solu-Cortef, okay to continue his home dose prednisone  6) social/ethics-patient is a full code, palliative care consult to help delineate goals of care   Code Status : FULL   Disposition Plan  :  Home with [facility],, discharge plan discussed with patient's wife [name] prior to discharge  Consults  : Palliative   Discharge Condition: stable  Diet and Activity recommendation:  As advised  Discharge Instructions   Discharge Instructions    Call MD for:  difficulty breathing, headache or visual disturbances   Complete by:  As directed    Call MD for:  persistant dizziness or light-headedness   Complete by:  As directed    Call MD for:  persistant nausea and vomiting   Complete by:  As directed    Call MD for:  redness, tenderness, or signs of infection (pain, swelling, redness, odor or green/yellow discharge around incision site)   Complete by:  As directed    Call MD for:  severe uncontrolled pain   Complete by:  As directed    Call MD for:  temperature >100.4   Complete by:  As directed    Diet - low sodium heart healthy   Complete by:  As directed    Tube feeding and water flushes per protocol   Diet - low sodium heart healthy   Complete by:  As directed    Discharge instructions   Complete by:  As directed    Multiple sclerosis with neuromuscular deficits and bedbound status and Proteus UTI----  1)resume routine care 2)Frequent turns and decubitus ulcers (Bed sores) precautions as previously advised 3)Tube feeding with water flushes as advised 4)Cefazolin (Keflex) antibiotic  3 times a day for 5 days for Proteus Mirabilis urinary tract infection   Increase activity slowly   Complete by:  As directed         Discharge Medications     Allergies as of 07/09/2017   No Known Allergies     Medication List    TAKE these medications   acetaminophen 500 MG tablet Commonly known as:  TYLENOL Take 1 tablet (500 mg total) by mouth every 8 (eight) hours as needed for fever (or pain). What changed:    how much to take  when to take this   ACIDOPHILUS PO Place 1 capsule into feeding tube daily.   baclofen 20 MG tablet Commonly known as:  LIORESAL Place 1 tablet (20 mg total) into feeding tube 4 (four) times daily.   bisacodyl 10 MG suppository Commonly known as:  DULCOLAX Place 10 mg rectally once as needed for moderate constipation.   buPROPion 75 MG tablet Commonly known as:  WELLBUTRIN Place 1 tablet (75 mg total) into feeding tube 3 (three) times daily.   cephALEXin 250 MG/5ML suspension Commonly known as:  KEFLEX Place 10 mLs (500 mg total) into feeding tube 3 (three) times daily.   cholecalciferol 1000 units tablet Commonly known as:  VITAMIN D Take 1 tablet (1,000 Units total) by mouth 2 (two) times daily. What changed:    how much to take  how to take this   Cranberry Juice Powder 425 MG Caps 1 capsule (425 mg total) by PEG Tube route 2 (two) times daily.   dantrolene 50 MG capsule Commonly known as:  DANTRIUM Give 50 mg by tube 4 (four) times daily. At 0800, 1200, 1600, 2000   diazepam 5 MG tablet Commonly known as:  VALIUM Place 5 mg into feeding tube at bedtime as needed for anxiety (sleep).   feeding supplement (JEVITY 1.5 CAL) Liqd Place 1,000 mLs into feeding tube daily.   fexofenadine 180 MG tablet Commonly known as:  ALLEGRA Place 180 mg into feeding tube daily.   free water Soln Place 240 mLs into feeding tube every 4 (four) hours.   Garlic Oil 7673 MG Caps Give 1,000 mg by tube daily with breakfast.    guaifenesin 100 MG/5ML syrup Commonly known as:  ROBITUSSIN Take 200 mg by mouth 3 (three) times daily as needed for cough.   HYDROcodone-acetaminophen 7.5-325 MG tablet Commonly known as:  [place] 1 tablet into feeding tube daily as needed (pain).   ipratropium-albuterol 0.5-2.5 (3) MG/3ML Soln Commonly known as:  DUONEB Take 3 mLs by nebulization See admin instructions. Four times daily and as needed for shortness of breath   levETIRAcetam 500 MG tablet Commonly known as:  KEPPRA Take 3 tablets (1,500 mg total) by mouth 2 (two) times daily.   multivitamin with minerals Tabs tablet Place 1 tablet into feeding tube daily.   NALTREXONE HCL PO Give 4.5 mg by tube at bedtime.   neomycin-bacitracin-polymyxin [phone] ointment Apply 1 application topically 4 (four) times daily as needed (abraisions, irritations).   nitrofurantoin (macrocrystal-monohydrate) 100 MG capsule Commonly known as:  MACROBID Take 100 mg by mouth daily.   nystatin powder Generic drug:  nystatin Apply 1 g topically 3 (three) times daily. What changed:    when to take this  additional instructions   omeprazole 2 mg/mL Susp Commonly known as:  PRILOSEC Place 20 mLs (40 mg total) into feeding tube daily.   polyethylene glycol packet Commonly known as:  MIRALAX / GLYCOLAX Place 17 g into feeding tube daily. What changed:  when to take this   polyvinyl alcohol 1.4 % ophthalmic solution Commonly known as:  LIQUIFILM TEARS Place 1 drop into both eyes 3 (three) times daily. What changed:  when to take this   predniSONE 5 MG/ML concentrated solution Place 2 mLs (10 mg total) into feeding tube daily with breakfast. Resume after taper.   senna 8.6 MG Tabs tablet Commonly known as:  SENOKOT Take 2 tablets by mouth at bedtime.   SIMPLY SALINE 0.9 % Aers Generic drug:  Saline Place 1 spray into both nostrils as needed (for nasal conjestion).   sodium phosphate enema Place 1 enema rectally  once as needed (for constipation). follow package directions   tiZANidine 2 MG tablet Commonly known as:  ZANAFLEX Take 1 tablet (2 mg total) by mouth every 6 (six) hours  as needed for muscle spasms. What changed:    how to take this  when to take this   vitamin C 500 MG tablet Commonly known as:  ASCORBIC ACID Place 500 mg into feeding tube 2 (two) times daily.       Major procedures and Radiology Reports - PLEASE review detailed and final reports for all details, in brief -    Dg Chest Port 1 View  Result Date: 07/07/2017 CLINICAL DATA:  Cough for 3 days EXAM: PORTABLE CHEST 1 VIEW COMPARISON:  04/29/2016, 02/10/2016 FINDINGS: Low lung volume without acute consolidation or effusion. Normal heart size. No pneumothorax. Lucency beneath the right diaphragm, suspect air-filled dilated bowel. IMPRESSION: 1. Low lung volumes without acute focal opacity 2. Probable dilated air filled bowel beneath the right diaphragm Electronically Signed   By: Donavan Foil M.D.   On: 07/07/2017 19:54    Micro Results    Recent Results (from the past 240 hour(s))  Urine culture     Status: Abnormal   Collection Time: 07/07/17  6:30 PM  Result Value Ref Range Status   Specimen Description URINE, RANDOM  Final   Special Requests   Final    NONE Performed at [facility], [address] [place], [place] [address]    Culture >=100,000 COLONIES/mL PROTEUS MIRABILIS (A)  Final   Report Status 07/09/2017 FINAL  Final   Organism ID, Bacteria PROTEUS MIRABILIS (A)  Final      Susceptibility   Proteus mirabilis - MIC*    AMPICILLIN <=2 SENSITIVE Sensitive     CEFAZOLIN <=4 SENSITIVE Sensitive     CEFTRIAXONE <=1 SENSITIVE Sensitive     CIPROFLOXACIN >=4 RESISTANT Resistant     GENTAMICIN >=16 RESISTANT Resistant     IMIPENEM 2 SENSITIVE Sensitive     NITROFURANTOIN >=512 RESISTANT Resistant     TRIMETH/SULFA >=320 RESISTANT Resistant     AMPICILLIN/SULBACTAM <=2 SENSITIVE Sensitive      PIP/TAZO <=4 SENSITIVE Sensitive     * >=100,000 COLONIES/mL PROTEUS MIRABILIS  MRSA PCR Screening     Status: None   Collection Time: 07/08/17  2:00 AM  Result Value Ref Range Status   MRSA by PCR NEGATIVE NEGATIVE Final    Comment:        The GeneXpert MRSA Assay (FDA approved for NASAL specimens only), is one component of a comprehensive MRSA colonization surveillance program. It is not intended to diagnose MRSA infection nor to guide or monitor treatment for MRSA infections. Performed at [facility], [place] [address] [place], [place] [address]   Culture, blood (x 2)     Status: None (Preliminary result)   Collection Time: 07/08/17  6:10 AM  Result Value Ref Range Status   Specimen Description BLOOD RIGHT HAND  Final   Special Requests   Final    BOTTLES DRAWN AEROBIC ONLY Blood Culture results may not be optimal due to an inadequate volume of blood received in culture bottles   Culture   Final    NO GROWTH 1 DAY Performed at [facility], [place] [address], [place], [place] [address]    Report Status PENDING  Incomplete  Culture, blood (x 2)     Status: None (Preliminary result)   Collection Time: 07/08/17  6:10 AM  Result Value Ref Range Status   Specimen Description BLOOD RIGHT HAND  Final   Special Requests   Final    BOTTLES DRAWN AEROBIC ONLY Blood Culture results may  not be optimal due to an inadequate volume of blood received in culture bottles   Culture   Final    NO GROWTH 1 DAY Performed at [facility], [place] [address], [place], [place] [address]    Report Status PENDING  Incomplete       Today   Subjective    Terry Palmer today has no new concerns, no fevers, no vomiting          Patient has been seen and examined prior to discharge   Objective   Blood pressure 133/81, pulse 80, temperature 98.7 F (37.1 C), temperature source Oral, resp. rate 18, height 6\' 4"  (1.93 m), weight 79.3 kg (174 lb 13.2 oz), SpO2 100  %.   Intake/Output Summary (Last 24 hours) at 07/09/2017 1445 Last data filed at 07/09/2017 0900 Gross per 24 hour  Intake 733.17 ml  Output 955 ml  Net -221.83 ml    Exam Gen:-Nonverbal,  HEENT:- [place].AT, No sclera icterus Neck-Supple Neck,No JVD,.  Lungs-  CTAB , fair air movement CV- S1, S2 normal Abd-  +ve B.Sounds, Abd Soft, No tenderness, PEG site is clean dry and intact, Extremity/Skin:-Warm and dry with good pulses Psych-unable to assess as patient is nonverbal/noncommunicative GU-suprapubic catheter Neuro- Nonverbal, not follow commands, quadriplegia with extremity contractures and muscle wasting/atrophy , patient spontaneously opens his eyes     Data Review   CBC w Diff:  Lab Results  Component Value Date   WBC 8.5 07/08/2017   HGB 14.5 07/08/2017   HCT 44.7 07/08/2017   PLT 237 07/08/2017   LYMPHOPCT 12 07/07/2017   MONOPCT 10 07/07/2017   EOSPCT 2 07/07/2017   BASOPCT 1 07/07/2017    CMP:  Lab Results  Component Value Date   NA 138 07/08/2017   K 4.2 07/08/2017   CL 107 07/08/2017   CO2 20 (L) 07/08/2017   BUN 5 (L) 07/08/2017   CREATININE 0.51 (L) 07/08/2017   PROT 7.5 07/07/2017   ALBUMIN 3.9 07/07/2017   BILITOT 0.4 07/07/2017   ALKPHOS 103 07/07/2017   AST 26 07/07/2017   ALT 22 07/07/2017   Total Discharge time is about 33 minutes  Roxan Hockey M.D on 07/09/2017 at 2:45 PM  [facility]   Office  [phone]  Voice Recognition Viviann Spare dictation system was used to create this note, attempts have been made to correct errors. Please contact the author with questions and/or clarifications.

## 2017-07-09 NOTE — Care Management Note (Addendum)
Case Management Note  Patient Details  Name: Terry Palmer MRN: [id number] Date of Birth: August 22, 1975  Subjective/Objective:         Patient from home w wife who cares for him along with caregivers in the home. Patient is nonverbal has PEG and is bed bound s/p rapid decline in 2012 of MS.  CM will continue to follow for additional needs at home. Palliative Care consult in progress.           Action/Plan:  15:00 spoke w patient's wife, she declines need for additional DME or help. Aid is through [facility] w Medicaid does not require resumption orders. PTAR papers on chart. Please call [phone] when ready to send home. Wife will be home waiting for his return via [place].    Expected Discharge Date:                  Expected Discharge Plan:  [place]  In-House Referral:     Discharge planning Services  CM Consult  Post Acute Care Choice:    Choice offered to:     DME Arranged:    DME Agency:     HH Arranged:    [place] Agency:     Status of Service:  In process, will continue to follow  If discussed at Long Length of Stay Meetings, dates discussed:    Additional Comments:  Carles Collet, RN 07/09/2017, 11:39 AM

## 2017-07-09 NOTE — Progress Notes (Signed)
PHARMACY - PHYSICIAN COMMUNICATION CRITICAL VALUE ALERT - BLOOD CULTURE IDENTIFICATION (BCID)  [name] is an 42 y.o. male who presented to [facility] on 07/07/2017 with a chief complaint of altered mental status/UTI  Assessment:  On Keflex for Proteus UTI, blood cultures with one bottle + for staph species, likely contaminant, WBC is down from 13.3>>8.5 with current treatment, afebrile  Name of physician (or Provider) Contacted: Tylene Fantasia ([facility])  Current antibiotics: Keflex   Changes to prescribed antibiotics recommended:  No changes for now  Results for orders placed or performed during the hospital encounter of 07/07/17  Blood Culture ID Panel (Reflexed) (Collected: 07/08/2017  6:10 AM)  Result Value Ref Range   Enterococcus species NOT DETECTED NOT DETECTED   Listeria monocytogenes NOT DETECTED NOT DETECTED   Staphylococcus species DETECTED (A) NOT DETECTED   Staphylococcus aureus NOT DETECTED NOT DETECTED   Methicillin resistance DETECTED (A) NOT DETECTED   Streptococcus species NOT DETECTED NOT DETECTED   Streptococcus agalactiae NOT DETECTED NOT DETECTED   Streptococcus pneumoniae NOT DETECTED NOT DETECTED   Streptococcus pyogenes NOT DETECTED NOT DETECTED   Acinetobacter baumannii NOT DETECTED NOT DETECTED   Enterobacteriaceae species NOT DETECTED NOT DETECTED   Enterobacter cloacae complex NOT DETECTED NOT DETECTED   Escherichia coli NOT DETECTED NOT DETECTED   Klebsiella oxytoca NOT DETECTED NOT DETECTED   Klebsiella pneumoniae NOT DETECTED NOT DETECTED   Proteus species NOT DETECTED NOT DETECTED   Serratia marcescens NOT DETECTED NOT DETECTED   Haemophilus influenzae NOT DETECTED NOT DETECTED   Neisseria meningitidis NOT DETECTED NOT DETECTED   Pseudomonas aeruginosa NOT DETECTED NOT DETECTED   Candida albicans NOT DETECTED NOT DETECTED   Candida glabrata NOT DETECTED NOT DETECTED   Candida krusei NOT DETECTED NOT DETECTED   Candida parapsilosis NOT DETECTED  NOT DETECTED   Candida tropicalis NOT DETECTED NOT DETECTED    Narda Bonds 07/09/2017  11:40 PM

## 2017-07-09 NOTE — Discharge Instructions (Signed)
Multiple sclerosis with neuromuscular deficits and bedbound status and Proteus UTI----  1)resume routine care 2)Frequent turns and decubitus ulcers (Bed sores) precautions as previously advised 3)Tube feeding with water flushes as advised 4)Cefazolin (Keflex) antibiotic 3 times a day for 5 days for Proteus Mirabilis urinary tract infection

## 2017-07-09 NOTE — Progress Notes (Signed)
Pt alert & oriented x0. Respiration even and unlabored.  No c/o pain or discomfort.  Afrebrile. IV removed. D/C paperwork provided. D/C paper worker advises that if he has a temp>100.4, persistant dizziness, light-headedness, nausea and/or vomiting to please return to the ED. EMS to transport patient.

## 2017-07-11 LAB — CULTURE, BLOOD (ROUTINE X 2)

## 2017-07-13 LAB — CULTURE, BLOOD (ROUTINE X 2): Culture: NO GROWTH

## 2017-12-07 ENCOUNTER — Inpatient Hospital Stay (HOSPITAL_COMMUNITY)
Admission: EM | Admit: 2017-12-07 | Discharge: 2017-12-10 | DRG: 698 | Disposition: A | Discharge status: Home Health | Payer: Medicare HMO | Attending: Internal Medicine | Admitting: Internal Medicine

## 2017-12-07 ENCOUNTER — Encounter (HOSPITAL_COMMUNITY): Payer: Self-pay

## 2017-12-07 ENCOUNTER — Emergency Department (HOSPITAL_COMMUNITY): Payer: Medicare HMO

## 2017-12-07 DIAGNOSIS — G92 Toxic encephalopathy: Secondary | ICD-10-CM | POA: Diagnosis present

## 2017-12-07 DIAGNOSIS — N390 Urinary tract infection, site not specified: Secondary | ICD-10-CM | POA: Diagnosis present

## 2017-12-07 DIAGNOSIS — E273 Drug-induced adrenocortical insufficiency: Secondary | ICD-10-CM | POA: Diagnosis present

## 2017-12-07 DIAGNOSIS — Z79899 Other long term (current) drug therapy: Secondary | ICD-10-CM

## 2017-12-07 DIAGNOSIS — R6520 Severe sepsis without septic shock: Secondary | ICD-10-CM | POA: Diagnosis present

## 2017-12-07 DIAGNOSIS — Z79891 Long term (current) use of opiate analgesic: Secondary | ICD-10-CM | POA: Diagnosis not present

## 2017-12-07 DIAGNOSIS — Z87440 Personal history of urinary (tract) infections: Secondary | ICD-10-CM | POA: Diagnosis not present

## 2017-12-07 DIAGNOSIS — IMO0001 Reserved for inherently not codable concepts without codable children: Secondary | ICD-10-CM | POA: Diagnosis present

## 2017-12-07 DIAGNOSIS — Y846 Urinary catheterization as the cause of abnormal reaction of the patient, or of later complication, without mention of misadventure at the time of the procedure: Secondary | ICD-10-CM | POA: Diagnosis present

## 2017-12-07 DIAGNOSIS — R1310 Dysphagia, unspecified: Secondary | ICD-10-CM | POA: Diagnosis present

## 2017-12-07 DIAGNOSIS — G35 Multiple sclerosis: Secondary | ICD-10-CM | POA: Diagnosis present

## 2017-12-07 DIAGNOSIS — T380X5A Adverse effect of glucocorticoids and synthetic analogues, initial encounter: Secondary | ICD-10-CM | POA: Diagnosis present

## 2017-12-07 DIAGNOSIS — G8250 Quadriplegia, unspecified: Secondary | ICD-10-CM | POA: Diagnosis present

## 2017-12-07 DIAGNOSIS — E2740 Unspecified adrenocortical insufficiency: Secondary | ICD-10-CM | POA: Diagnosis present

## 2017-12-07 DIAGNOSIS — Z515 Encounter for palliative care: Secondary | ICD-10-CM | POA: Diagnosis not present

## 2017-12-07 DIAGNOSIS — R5383 Other fatigue: Secondary | ICD-10-CM | POA: Diagnosis present

## 2017-12-07 DIAGNOSIS — G40909 Epilepsy, unspecified, not intractable, without status epilepticus: Secondary | ICD-10-CM | POA: Diagnosis present

## 2017-12-07 DIAGNOSIS — Z7189 Other specified counseling: Secondary | ICD-10-CM

## 2017-12-07 DIAGNOSIS — R627 Adult failure to thrive: Secondary | ICD-10-CM | POA: Diagnosis present

## 2017-12-07 DIAGNOSIS — A419 Sepsis, unspecified organism: Secondary | ICD-10-CM | POA: Diagnosis present

## 2017-12-07 DIAGNOSIS — R532 Functional quadriplegia: Secondary | ICD-10-CM | POA: Diagnosis present

## 2017-12-07 DIAGNOSIS — Z23 Encounter for immunization: Secondary | ICD-10-CM

## 2017-12-07 DIAGNOSIS — Z7951 Long term (current) use of inhaled steroids: Secondary | ICD-10-CM

## 2017-12-07 DIAGNOSIS — T83518A Infection and inflammatory reaction due to other urinary catheter, initial encounter: Secondary | ICD-10-CM | POA: Diagnosis present

## 2017-12-07 DIAGNOSIS — Z87891 Personal history of nicotine dependence: Secondary | ICD-10-CM | POA: Diagnosis not present

## 2017-12-07 DIAGNOSIS — F0280 Dementia in other diseases classified elsewhere without behavioral disturbance: Secondary | ICD-10-CM | POA: Diagnosis present

## 2017-12-07 DIAGNOSIS — N319 Neuromuscular dysfunction of bladder, unspecified: Secondary | ICD-10-CM | POA: Diagnosis present

## 2017-12-07 DIAGNOSIS — E43 Unspecified severe protein-calorie malnutrition: Secondary | ICD-10-CM | POA: Diagnosis present

## 2017-12-07 DIAGNOSIS — E872 Acidosis: Secondary | ICD-10-CM | POA: Diagnosis present

## 2017-12-07 DIAGNOSIS — Z931 Gastrostomy status: Secondary | ICD-10-CM | POA: Diagnosis not present

## 2017-12-07 DIAGNOSIS — Z7401 Bed confinement status: Secondary | ICD-10-CM | POA: Diagnosis not present

## 2017-12-07 DIAGNOSIS — Z6821 Body mass index (BMI) 21.0-21.9, adult: Secondary | ICD-10-CM

## 2017-12-07 LAB — CBC WITH DIFFERENTIAL/PLATELET
ABS IMMATURE GRANULOCYTES: 0.09 10*3/uL — AB (ref 0.00–0.07)
Basophils Absolute: 0.1 10*3/uL (ref 0.0–0.1)
Basophils Relative: 0 %
EOS ABS: 0 10*3/uL (ref 0.0–0.5)
Eosinophils Relative: 0 %
HCT: 49.8 % (ref 39.0–52.0)
Hemoglobin: 15.5 g/dL (ref 13.0–17.0)
IMMATURE GRANULOCYTES: 1 %
Lymphocytes Relative: 4 %
Lymphs Abs: 0.7 10*3/uL (ref 0.7–4.0)
MCH: 30.5 pg (ref 26.0–34.0)
MCHC: 31.1 g/dL (ref 30.0–36.0)
MCV: 97.8 fL (ref 80.0–100.0)
Monocytes Absolute: 0.3 10*3/uL (ref 0.1–1.0)
Monocytes Relative: 2 %
NEUTROS PCT: 93 %
Neutro Abs: 17 10*3/uL — ABNORMAL HIGH (ref 1.7–7.7)
PLATELETS: 280 10*3/uL (ref 150–400)
RBC: 5.09 MIL/uL (ref 4.22–5.81)
RDW: 13.1 % (ref 11.5–15.5)
WBC: 18.2 10*3/uL — ABNORMAL HIGH (ref 4.0–10.5)
nRBC: 0 % (ref 0.0–0.2)

## 2017-12-07 LAB — I-STAT CG4 LACTIC ACID, ED
LACTIC ACID, VENOUS: 4.93 mmol/L — AB (ref 0.5–1.9)
Lactic Acid, Venous: 3.89 mmol/L (ref 0.5–1.9)

## 2017-12-07 LAB — COMPREHENSIVE METABOLIC PANEL
ALT: 18 U/L (ref 0–44)
ANION GAP: 15 (ref 5–15)
AST: 27 U/L (ref 15–41)
Albumin: 4.4 g/dL (ref 3.5–5.0)
Alkaline Phosphatase: 90 U/L (ref 38–126)
BUN: 14 mg/dL (ref 6–20)
CHLORIDE: 103 mmol/L (ref 98–111)
CO2: 21 mmol/L — ABNORMAL LOW (ref 22–32)
Calcium: 9.7 mg/dL (ref 8.9–10.3)
Creatinine, Ser: 0.71 mg/dL (ref 0.61–1.24)
GFR calc Af Amer: >60 mL/min (ref >60)
Glucose, Bld: 114 mg/dL — ABNORMAL HIGH (ref 70–99)
Potassium: 4.2 mmol/L (ref 3.5–5.1)
Sodium: 139 mmol/L (ref 135–145)
Total Bilirubin: 0.6 mg/dL (ref 0.3–1.2)
Total Protein: 8.6 g/dL — ABNORMAL HIGH (ref 6.5–8.1)

## 2017-12-07 LAB — URINALYSIS, ROUTINE W REFLEX MICROSCOPIC
Bilirubin Urine: NEGATIVE
GLUCOSE, UA: NEGATIVE mg/dL
HGB URINE DIPSTICK: NEGATIVE
KETONES UR: 5 mg/dL — AB
NITRITE: NEGATIVE
PROTEIN: 100 mg/dL — AB
RBC / HPF: >50 RBC/hpf — ABNORMAL HIGH (ref 0–5)
Specific Gravity, Urine: 1.025 (ref 1.005–1.030)
pH: 6 (ref 5.0–8.0)

## 2017-12-07 LAB — PROTIME-INR
INR: 1.03
Prothrombin Time: 13.4 seconds (ref 11.4–15.2)

## 2017-12-07 MED ORDER — VANCOMYCIN HCL 10 G IV SOLR
1500.0000 mg | Freq: Once | INTRAVENOUS | Status: AC
  Administered 2017-12-08: 1500 mg via INTRAVENOUS
  Filled 2017-12-07: qty 1500

## 2017-12-07 MED ORDER — SODIUM CHLORIDE 0.9 % IV BOLUS (SEPSIS)
1000.0000 mL | Freq: Once | INTRAVENOUS | Status: AC
  Administered 2017-12-07: 1000 mL via INTRAVENOUS

## 2017-12-07 MED ORDER — VANCOMYCIN HCL IN DEXTROSE 1-5 GM/200ML-% IV SOLN
1000.0000 mg | Freq: Three times a day (TID) | INTRAVENOUS | Status: DC
  Administered 2017-12-08: 1000 mg via INTRAVENOUS
  Filled 2017-12-07 (×2): qty 200

## 2017-12-07 MED ORDER — ACETAMINOPHEN 500 MG PO TABS
500.0000 mg | ORAL_TABLET | ORAL | Status: DC | PRN
  Administered 2017-12-07 – 2017-12-10 (×3): 500 mg
  Filled 2017-12-07 (×4): qty 1

## 2017-12-07 MED ORDER — VANCOMYCIN HCL IN DEXTROSE 1-5 GM/200ML-% IV SOLN: 1000.0000 mg | Freq: Once | INTRAVENOUS | Status: DC

## 2017-12-07 MED ORDER — LEVETIRACETAM 100 MG/ML PO SOLN
1500.0000 mg | Freq: Two times a day (BID) | ORAL | Status: DC
  Administered 2017-12-07 – 2017-12-10 (×6): 1500 mg
  Filled 2017-12-07 (×6): qty 15

## 2017-12-07 MED ORDER — METRONIDAZOLE IN NACL 5-0.79 MG/ML-% IV SOLN
500.0000 mg | Freq: Three times a day (TID) | INTRAVENOUS | Status: DC
  Administered 2017-12-07 – 2017-12-08 (×2): 500 mg via INTRAVENOUS
  Filled 2017-12-07 (×2): qty 100

## 2017-12-07 MED ORDER — CEFEPIME HCL 2 G IJ SOLR
2.0000 g | Freq: Once | INTRAMUSCULAR | Status: AC
  Administered 2017-12-07: 2 g via INTRAVENOUS
  Filled 2017-12-07: qty 2

## 2017-12-07 MED ORDER — ACETAMINOPHEN 650 MG RE SUPP
650.0000 mg | Freq: Once | RECTAL | Status: AC
  Administered 2017-12-07: 650 mg via RECTAL
  Filled 2017-12-07: qty 1

## 2017-12-07 MED ORDER — SODIUM CHLORIDE 0.9 % IV BOLUS (SEPSIS): 500.0000 mL | Freq: Once | INTRAVENOUS | Status: DC

## 2017-12-07 MED ORDER — SODIUM CHLORIDE 0.9 % IV SOLN
1.0000 g | Freq: Three times a day (TID) | INTRAVENOUS | Status: DC
  Administered 2017-12-08 (×4): 1 g via INTRAVENOUS
  Filled 2017-12-07 (×6): qty 1

## 2017-12-07 NOTE — ED Provider Notes (Signed)
Angiocath insertion Performed by: Merrily Pew  Consent: Verbal consent obtained. Risks and benefits: risks, benefits and alternatives were discussed Time out: Immediately prior to procedure a "time out" was called to verify the correct patient, procedure, equipment, support staff and site/side marked as required.  Preparation: Patient was prepped and draped in the usual sterile fashion.  Vein Location: left basilic  Ultrasound Guided  Gauge: 20  Normal blood return and flush without difficulty Patient tolerance: Patient tolerated the procedure well with no immediate complications.      Merrily Pew, MD 12/07/17 [phone]

## 2017-12-07 NOTE — ED Notes (Addendum)
IV noted to be infiltrated; site cool to touch, swollen; EDP notified, IV removed

## 2017-12-07 NOTE — ED Provider Notes (Signed)
[facility] EMERGENCY DEPARTMENT Provider Note   CSN: [id number] Arrival date & time: 12/07/17  1317     History   Chief Complaint Chief Complaint  Patient presents with  . Fever  . Fatigue    HPI Terry Palmer is a 42 y.o. 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  Rectal fever 100.8 F.  At baseline patient can only communicate by blinking his eyes.  RN and wife state that he has not been doing this and has just been asleep since this morning.  Per RN report, patient just finished antibiotics for UTI 4 days ago.  She has noticed his urine is more clear and less malodorous.  RN found him today with copious amounts of oral secretions on his chest.  He has a chronic cough that is not changed.  Has had looser stools for the last several days and while on antibiotics.  On Friday wife noticed that his abdomen was very swollen and rigid, they were able to aspirate a lot of gas from his PEG tube.  Since he has been passing a lot of gas but his abdomen has decreased in size.  No vomiting. No h/o c. Diff.  HPI  Past Medical History:  Diagnosis Date  . Aspiration pneumonia ([place]) 01/20/2013  . Bladder calculi   . Childhood asthma   . 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 Depression   . Dysphagia   . 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 MS (multiple sclerosis) ([place])   . Neurogenic bladder   . Neuromuscular disorder ([place])    Quadraperesis  . Normocytic anemia 05/28/2011  . Quadriparesis (muscle weakness) ([place]) 03/12/2011  . Quadriplegia and quadriparesis ([place]) 12/24/2014  . Recurrent upper respiratory infection (URI)   . Recurrent UTI   . Seizure disorder ([place])   . Shortness of breath     Patient Active Problem List   Diagnosis Date Noted  .  Palliative care encounter   . Acute metabolic encephalopathy 24/09/7351  . Pneumonia due to infectious organism   . Steroid-induced adrenal suppression ([place]) 04/27/2016  . Lactic acidosis 04/27/2016  . Acute cystitis with hematuria   . Suprapubic catheter ([place])   . G tube feedings ([place])   . Flexion contractures   . Encephalopathy   . Bacteremia 05/27/2015  . Sepsis ([name]) 05/25/2015  . Altered mental state 05/25/2015  . Altered mental status 05/25/2015  . Fever, unspecified 03/04/2015  . Quadriplegia and quadriparesis ([place]) 12/24/2014  . 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 Fever   . 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 Protein-calorie malnutrition, severe ([place]) 10/21/2014  . UTI (urinary tract infection) 10/20/2014  . Sepsis secondary to UTI ([place]) 10/20/2014  . Leukocytosis 10/20/2014  . Acute respiratory failure with hypoxia ([place]) 10/20/2014  . Seizure disorder ([place]) 10/20/2014  . Aspiration pneumonitis ([place]) 10/20/2014  . Neurogenic bladder 05/28/2011  . Multiple sclerosis ([place]) 04/20/2011  . FTT (failure to thrive) in adult 04/20/2011  . Sacral decubitus ulcer, stage II ([place]) 04/20/2011  . Quadriparesis (muscle weakness) ([place]) 03/12/2011    Past Surgical History:  Procedure Laterality Date  . GASTROSTOMY  04/16/2011   Procedure: GASTROSTOMY;  Surgeon: Zenovia Jarred, MD;  Location: [facility];  Service: General;  Laterality: N/A;  Open G-Tube placement  . LUMBAR PUNCTURE  10/12/2002  Home Medications    Prior to Admission medications   Medication Sig Start Date End Date Taking? Authorizing Provider  acetaminophen (TYLENOL) 500 MG tablet Take 1 tablet (500 mg total) by mouth every 8 (eight) hours as needed for fever (or pain). Patient taking differently: Take 1,000 mg by mouth 2 (two) times daily.  03/05/15   Debbe Odea, MD  baclofen (LIORESAL) 20 MG tablet Place 1 tablet (20 mg total) into feeding tube 4 (four) times daily. 07/13/15   Kathrynn Ducking, MD  bisacodyl (DULCOLAX) 10 MG suppository Place 10 mg rectally once as needed for moderate constipation.    [provider]  buPROPion (WELLBUTRIN) 75 MG tablet Place 1 tablet (75 mg total) into feeding tube 3 (three) times daily. 01/24/13   Bonnielee Haff, MD  cephALEXin [facility]) 250 MG/5ML suspension Place 10 mLs (500 mg total) into feeding tube 3 (three) times daily. 07/09/17   Roxan Hockey, MD  cholecalciferol (VITAMIN D) 1000 UNITS tablet Take 1 tablet (1,000 Units total) by mouth 2 (two) times daily. Patient taking differently: Give 5,000 Units by tube 2 (two) times daily.  01/24/13   Bonnielee Haff, MD  Cranberry Juice Powder 425 MG CAPS 1 capsule (425 mg total) by PEG Tube route 2 (two) times daily. 01/24/13   Bonnielee Haff, MD  dantrolene (DANTRIUM) 50 MG capsule Give 50 mg by tube 4 (four) times daily. At 0800, 1200, 1600, 2000    [provider]  diazepam (VALIUM) 5 MG tablet Place 5 mg into feeding tube at bedtime as needed for anxiety (sleep).     [provider]  fexofenadine (ALLEGRA) 180 MG tablet Place 180 mg into feeding tube daily.    [provider]  Garlic Oil 3500 MG CAPS Give 1,000 mg by tube daily with breakfast.     [provider]  guaifenesin (ROBITUSSIN) 100 MG/5ML syrup Take 200 mg by mouth 3 (three) times daily as needed for cough.    [provider]  HYDROcodone-acetaminophen (NORCO) 7.5-325 MG per tablet Place 1 tablet into feeding tube daily as needed (pain).     [provider]  ipratropium-albuterol (DUONEB) 0.5-2.5 (3) MG/3ML SOLN Take 3 mLs by nebulization See admin instructions. Four times daily and as needed for shortness of breath    [provider]  Lactobacillus (ACIDOPHILUS PO) Place 1 capsule into feeding tube daily.     [provider]  levETIRAcetam (KEPPRA) 500 MG tablet Take 3 tablets (1,500 mg total) by mouth 2 (two) times daily. 05/29/15   Jule Ser, DO   Multiple Vitamin (MULTIVITAMIN WITH MINERALS) TABS tablet Place 1 tablet into feeding tube daily. 01/24/13   Bonnielee Haff, MD  NALTREXONE HCL PO Give 4.5 mg by tube at bedtime.    [provider]  neomycin-bacitracin-polymyxin (NEOSPORIN) [phone] ointment Apply 1 application topically 4 (four) times daily as needed (abraisions, irritations).    [provider]  nitrofurantoin, macrocrystal-monohydrate, (MACROBID) 100 MG capsule Take 100 mg by mouth daily.  06/12/17   [provider]  Nutritional Supplements (FEEDING SUPPLEMENT, JEVITY 1.5 CAL,) LIQD Place 1,000 mLs into feeding tube daily.     [provider]  nystatin (MYCOSTATIN/NYSTOP) 100000 UNIT/GM POWD Apply 1 g topically 3 (three) times daily. Patient taking differently: Apply 1 g topically daily. Applies to groin 01/24/13   Bonnielee Haff, MD  omeprazole (PRILOSEC) 2 mg/mL SUSP Place 20 mLs (40 mg total) into feeding tube daily. 01/24/13   Bonnielee Haff, MD  polyethylene  glycol (MIRALAX / GLYCOLAX) packet Place 17 g into feeding tube daily. Patient taking differently: Place 17 g into feeding tube every evening.  01/24/13   Bonnielee Haff, MD  polyvinyl alcohol (LIQUIFILM TEARS) 1.4 % ophthalmic solution Place 1 drop into both eyes 3 (three) times daily. Patient taking differently: Place 1 drop into both eyes 2 (two) times daily.  01/24/13   Bonnielee Haff, MD  predniSONE 5 MG/ML concentrated solution Place 2 mLs (10 mg total) into feeding tube daily with breakfast. Resume after taper. Patient not taking: Reported on 07/08/2017 05/10/16   Eugenie Filler, MD  Saline (SIMPLY SALINE) 0.9 % AERS Place 1 spray into both nostrils as needed (for nasal conjestion).    [provider]  senna (SENOKOT) 8.6 MG TABS tablet Take 2 tablets by mouth at bedtime.     [provider]  sodium phosphate (FLEET) enema Place 1 enema rectally once as needed (for constipation). follow package directions     [provider]  tiZANidine (ZANAFLEX) 2 MG tablet Take 1 tablet (2 mg total) by mouth every 6 (six) hours as needed for muscle spasms. Patient taking differently: Place 2 mg into feeding tube 3 (three) times daily.  11/25/15   Kathrynn Ducking, MD  vitamin C (ASCORBIC ACID) 500 MG tablet Place 500 mg into feeding tube 2 (two) times daily.    [provider]  Water For Irrigation, Sterile (FREE WATER) SOLN Place 240 mLs into feeding tube every 4 (four) hours. 01/24/13   Bonnielee Haff, MD    Family History Family History  Problem Relation Age of Onset  . Asthma Mother     Social History Social History   Tobacco Use  . Smoking status: Former Smoker    Types: Cigarettes    Last attempt to quit: 02/02/1999    Years since quitting: 18.8  . Smokeless tobacco: Former Network engineer Use Topics  . Alcohol use: No  . Drug use: Not on file    Comment: Former use     Allergies   Patient has no known allergies.   Review of Systems Review of Systems  Unable to perform ROS: Dementia  Constitutional: Positive for fever.  Neurological:       Somnolence   All other systems reviewed and are negative.    Physical Exam Updated Vital Signs BP 112/85   Pulse (!) 117   Temp (!) 101.5 F (38.6 C) (Axillary)   Resp (!) 23   Ht 6\' 4"  (1.93 m)   Wt 79.3 kg   SpO2 96%   BMI 21.28 kg/m   Physical Exam  Constitutional: He is oriented to person, place, and time. He appears well-developed and well-nourished.  Non-toxic appearance.  Asleep.  Does not open eyes to verbal or tactile stimuli.  Wife and home nurse at bedside.  HENT:  Head: Normocephalic.  Right Ear: External ear normal.  Left Ear: External ear normal.  Nose: Nose normal.  Dry lips but mucous membranes are moist.  Eyes: Conjunctivae are normal.  PERRLA bilaterally.  Unable to assess EOMs, patient is not moving his eyes or following commands.  Neck: Full passive range of motion without pain.    Cardiovascular: Normal rate and regular rhythm.  1+ radial and DP pulses bilaterally.  No lower extremity edema.  Pulmonary/Chest: Effort normal and breath sounds normal. No tachypnea. No respiratory distress.  Faint expiratory rhonchi to lower lobes more prominent on the right.  Tachypneic.  No obvious respiratory distress  noted.  Abdominal: Soft. He exhibits no distension.  Genitourinary:  Genitourinary Comments: Dark yellow urine without obvious blood.  From Foley.  Musculoskeletal: Normal range of motion.  Neurological: He is alert and oriented to person, place, and time.  Not responsive to verbal or tactile stimuli.  Nonverbal.  PERRLA bilaterally.  Muscle wasting noted to all extremities.  All 4 extremities are contracted.  Skin: Skin is warm and dry. Capillary refill takes less than 2 seconds.  Psychiatric: His behavior is normal. Thought content normal.     ED Treatments / Results  Labs (all labs ordered are listed, but only abnormal results are displayed) Labs Reviewed  COMPREHENSIVE METABOLIC PANEL - Abnormal; Notable for the following components:      Result Value   CO2 21 (*)    Glucose, Bld 114 (*)    Total Protein 8.6 (*)    All other components within normal limits  CBC WITH DIFFERENTIAL/PLATELET - Abnormal; Notable for the following components:   WBC 18.2 (*)    Neutro Abs 17.0 (*)    Abs Immature Granulocytes 0.09 (*)    All other components within normal limits  URINALYSIS, ROUTINE W REFLEX MICROSCOPIC - Abnormal; Notable for the following components:   Color, Urine AMBER (*)    APPearance HAZY (*)    Ketones, ur 5 (*)    Protein, ur 100 (*)    Leukocytes, UA LARGE (*)    RBC / HPF >50 (*)    WBC, UA >50 (*)    Bacteria, UA RARE (*)    All other components within normal limits  I-STAT CG4 LACTIC ACID, ED - Abnormal; Notable for the following components:   Lactic Acid, Venous 4.93 (*)    All other components within normal limits  I-STAT CG4 LACTIC ACID,  ED - Abnormal; Notable for the following components:   Lactic Acid, Venous 3.89 (*)    All other components within normal limits  CULTURE, BLOOD (ROUTINE X 2)  CULTURE, BLOOD (ROUTINE X 2)  PROTIME-INR    EKG None  Radiology Dg Chest Portable 1 View  Result Date: 12/07/2017 CLINICAL DATA:  Fever and lethargy. EXAM: PORTABLE CHEST 1 VIEW COMPARISON:  07/07/2017, 03/03/2015 FINDINGS: Lungs are hypoinflated without focal airspace consolidation or effusion. Moderate air under the right hemidiaphragm likely within colon and similar to 03/03/2015. Cardiomediastinal silhouette and remainder of the exam is unchanged. IMPRESSION: No acute cardiopulmonary disease. Air under the right hemidiaphragm likely within prominent colonic loops and not significantly changed from 03/03/2015. Electronically Signed   By: Marin Olp M.D.   On: 12/07/2017 15:25    Procedures .Critical Care Performed by: Kinnie Feil, PA-C Authorized by: Kinnie Feil, PA-C   Critical care provider statement:    Critical care time (minutes):  45   Critical care was necessary to treat or prevent imminent or life-threatening deterioration of the following conditions:  Sepsis   Critical care was time spent personally by me on the following activities:  Discussions with consultants, evaluation of patient's response to treatment, examination of patient, ordering and performing treatments and interventions, ordering and review of laboratory studies, ordering and review of radiographic studies, pulse oximetry, re-evaluation of patient's condition, obtaining history from patient or surrogate and review of old charts   I assumed direction of critical care for this patient from another provider in my specialty: no     (including critical care time)  Medications Ordered in ED Medications  ceFEPIme (MAXIPIME) 2 g in sodium  chloride 0.9 % 100 mL IVPB (2 g Intravenous New Bag/Given 12/07/17 1554)  metroNIDAZOLE (FLAGYL) IVPB  500 mg (has no administration in time range)  vancomycin (VANCOCIN) 1,500 mg in sodium chloride 0.9 % 500 mL IVPB (has no administration in time range)  ceFEPIme (MAXIPIME) 1 g in sodium chloride 0.9 % 100 mL IVPB (has no administration in time range)  vancomycin (VANCOCIN) IVPB 1000 mg/200 mL premix (has no administration in time range)  sodium chloride 0.9 % bolus 1,000 mL (1,000 mLs Intravenous New Bag/Given 12/07/17 1550)    And  sodium chloride 0.9 % bolus 1,000 mL (has no administration in time range)    And  sodium chloride 0.9 % bolus 500 mL (has no administration in time range)  acetaminophen (TYLENOL) suppository 650 mg (650 mg Rectal Given 12/07/17 1456)     Initial Impression / Assessment and Plan / ED Course  I have reviewed the triage vital signs and the nursing notes.  Pertinent labs & imaging results that were available during my care of the patient were reviewed by me and considered in my medical decision making (see chart for details).  Clinical Course as of Dec 07 1609  Sat Dec 07, 2017  1442 Leukocytes, UA(!): LARGE [CG]  1442 WBC, UA(!): >50 [CG]  1442 Bacteria, UA(!): RARE [CG]  1442 Squamous Epithelial / LPF: 0-5 [CG]  1542 IMPRESSION: No acute cardiopulmonary disease.  Air under the right hemidiaphragm likely within prominent colonic loops and not significantly changed from 03/03/2015.  DG Chest Portable 1 View [CG]    Clinical Course User Index [CG] Kinnie Feil, PA-C    Concern for infectious process either pulmonary or GU source.  He has been on antibiotics and family reports increased gas and looser stools.  No previous history of C. difficile and home RN has low suspicion for this.  He meets sirs criteria with fever and tachycardia however I think his heart rate is elevated because he is having a fever.  He is otherwise hemodynamically stable.  We will defer sepsis code at this time and await results.   1608: Pt with severe sepsis. Tachycardia,  tachypnea, fever, lactic acidosis.  UA looks infected possibly undertreated.  CXR is w/o infiltrate. Difficult IV access. US guided IV by Dr Dayna Barker. Pt now receiving broad spectrum antibiotics will likely need to be narrowed in patient. Given lactic acid 30 cc/kg IVF ordered as well.  I will consult admitting team for admission.  Pt is full code confirmed by home RN and wife at bedside. Wife requesting updates, call for any questions [phone] [name])  1655: IV infiltrated. Dr Regenia Skeeter has placed a central line.  Pending consult to unassigned. Pt to be admitted by upcoming EDPA LKS.  Final Clinical Impressions(s) / ED Diagnoses   Final diagnoses:  Severe sepsis [facility])    ED Discharge Orders    None       Kinnie Feil, PA-C 12/07/17 1658    Pattricia Boss, MD 12/08/17 1535

## 2017-12-07 NOTE — ED Notes (Signed)
Pharmacy consulted regarding infiltration of Maxipime; per Apolonio Schneiders, pharmacist, Maxipime not a vesicant; only therapy needed is compression to site w/ hot or cold compress for 30-60 minutes

## 2017-12-07 NOTE — ED Provider Notes (Signed)
Central line obtained by Dr. Regenia Skeeter inserted a central line  I spoke to the Hospitalist who will see pt here for admission    Sidney Ace 12/07/17 1727    Sherwood Gambler, MD 12/08/17 1525

## 2017-12-07 NOTE — ED Triage Notes (Signed)
Pt form home via ems; per home health, pt febrile (100.8 rectal) and more lethargic than normal; bed bound due to MS, non-verebal at baseline; hx UTI, finished antibiotics 4 days ago  136/88 HR 124 RR 24 99% RA CBG 149 CO2 36

## 2017-12-07 NOTE — ED Notes (Signed)
Cold compress placed at IV site

## 2017-12-07 NOTE — ED Provider Notes (Signed)
Asked by primary team to assist with IV access. Has failed multiple IVs including ultrasound. Given critical illness, central line placed. No family immediately available, patient unable to consent.   CENTRAL LINE Performed by: Ephraim Hamburger Consent: The procedure was performed in an emergent situation. Required items: required blood products, implants, devices, and special equipment available Patient identity confirmed: arm band and provided demographic data Time out: Immediately prior to procedure a "time out" was called to verify the correct patient, procedure, equipment, support staff and site/side marked as required. Indications: vascular access Anesthesia: local infiltration Local anesthetic: lidocaine 1% without epinephrine Patient sedated: no Preparation: skin prepped with 2% chlorhexidine Skin prep agent dried: skin prep agent completely dried prior to procedure Sterile barriers: all five maximum sterile barriers used - cap, mask, sterile gown, sterile gloves, and large sterile sheet Hand hygiene: hand hygiene performed prior to central venous catheter insertion  Location details: Right IJ  Catheter type: triple lumen Catheter size: 8 Fr Pre-procedure: landmarks identified Ultrasound guidance: yes Successful placement: yes Post-procedure: line sutured and dressing applied Assessment: blood return through all parts, free fluid flow, placement verified by x-ray and no pneumothorax on x-ray Patient tolerance: Patient tolerated the procedure well with no immediate complications.    Sherwood Gambler, MD 12/07/17 1700

## 2017-12-07 NOTE — ED Notes (Addendum)
Cold compress removed from IV site; swelling has subsided

## 2017-12-07 NOTE — Progress Notes (Signed)
Pharmacy Antibiotic Note  Terry Palmer is a 42 y.o. male admitted on 12/07/2017 with sepsis.  Pharmacy has been consulted for vancomycin and cefepime dosing. Pt is febrile with Tmax 101.5 and WBC is elevated at 18.2. SCr is WNL and lactic acid is significantly elevated at 4.93.   Plan: Vancomycin 1500mg  IV x 1 then 1gm IV Q8H Cefepime 2gm IV x 1 then 1gm IV Q8H F/u renal fxn, C&S, clinical status and trough at SS  Height: 6\' 4"  (193 cm) Weight: 174 lb 13.2 oz (79.3 kg) IBW/kg (Calculated) : 86.8  Temp (24hrs), Avg:101.5 F (38.6 C), Min:101.5 F (38.6 C), Max:101.5 F (38.6 C)  Recent Labs  Lab 12/07/17 1458 12/07/17 1512  WBC 18.2*  --   LATICACIDVEN  --  4.93*    CrCl cannot be calculated (Patient's most recent lab result is older than the maximum 21 days allowed.).    No Known Allergies  Antimicrobials this admission: Vanc 11/30>> Cefepime 11/30>> Flagyl 11/30>>  Dose adjustments this admission: N/A  Microbiology results: Pending  Thank you for allowing pharmacy to be a part of this patient's care.  Maile Linford, Rande Lawman 12/07/2017 3:20 PM

## 2017-12-07 NOTE — H&P (Addendum)
History and Physical    Terry Palmer XNA:[id number] DOB: 02-28-1975 DOA: 12/07/2017  Referring MD/NP/PA: EDP PCP:  Patient coming from: EDP  Chief Complaint: lethargy  HPI: Terry Palmer is a 42 y.o. male with medical history of advanced multiple sclerosis, nonverbal at baseline, quadriplegic with contractures, dementia, suprapubic catheter, PEG tube, history of seizures brought to the emergency room by his family due to worsening lethargy x1 day.At baseline, he is non verbal but alert and communicates with his wife by blinking. -Patient's wife also reports that he just completed a 10-day course of antibiotics, for possible urinary infection 4days ago, no cultures drawn. -This morning he was also febrile, vomited x1.  No cough or congestion, no diarrhea. -The emergency room was noted to be febrile to 101.5, tachypneic, tachycardic, lactic acid was elevated at 4.9, WBC was 18 with left shift, UA abnormal, CXR without acute findings  Review of Systems: Unable to obtain  Past Medical History:  Diagnosis Date  . Aspiration pneumonia ([place]) 01/20/2013  . Bladder calculi   . Childhood asthma   . 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 Depression   . Dysphagia   . 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 MS (multiple sclerosis) ([place])   . Neurogenic bladder   . Neuromuscular disorder ([place])    Quadraperesis  . Normocytic anemia 05/28/2011  . Quadriparesis (muscle weakness) ([place]) 03/12/2011  . Quadriplegia and quadriparesis ([place]) 12/24/2014  . Recurrent upper respiratory infection (URI)   . Recurrent UTI   . Seizure disorder ([place])   . Shortness of breath     Past Surgical History:  Procedure Laterality Date  . GASTROSTOMY  04/16/2011   Procedure: GASTROSTOMY;  Surgeon: Zenovia Jarred, MD;  Location: [facility];  Service: General;  Laterality: N/A;  Open G-Tube placement  . LUMBAR PUNCTURE  10/12/2002     reports that he quit smoking about 18 years ago. His smoking use  included cigarettes. He has quit using smokeless tobacco. He reports that he does not drink alcohol. His drug history is not on file.  No Known Allergies  Family History  Problem Relation Age of Onset  . Asthma Mother      Prior to Admission medications   Medication Sig Start Date End Date Taking? Authorizing Provider  acetaminophen (TYLENOL) 500 MG tablet Take 1 tablet (500 mg total) by mouth every 8 (eight) hours as needed for fever (or pain). Patient taking differently: Take 1,000 mg by mouth 2 (two) times daily.  03/05/15   Debbe Odea, MD  baclofen (LIORESAL) 20 MG tablet Place 1 tablet (20 mg total) into feeding tube 4 (four) times daily. 07/13/15   Kathrynn Ducking, MD  bisacodyl (DULCOLAX) 10 MG suppository Place 10 mg rectally once as needed for moderate constipation.    [provider]  buPROPion (WELLBUTRIN) 75 MG tablet Place 1 tablet (75 mg total) into feeding tube 3 (three) times daily. 01/24/13   Bonnielee Haff, MD  cephALEXin [facility]) 250 MG/5ML suspension Place 10 mLs (500 mg total) into feeding tube 3 (three) times daily. 07/09/17   Roxan Hockey, MD  cholecalciferol (VITAMIN D) 1000 UNITS tablet Take 1 tablet (1,000 Units total) by mouth 2 (two) times daily. Patient taking differently: Give 5,000 Units by tube 2 (two) times daily.  01/24/13   Bonnielee Haff, MD  Cranberry Juice Powder 425 MG CAPS 1 capsule (425 mg total) by PEG Tube route 2 (two) times daily. 01/24/13   Bonnielee Haff, MD  dantrolene (  DANTRIUM) 50 MG capsule Give 50 mg by tube 4 (four) times daily. At 0800, 1200, 1600, 2000    [provider]  diazepam (VALIUM) 5 MG tablet Place 5 mg into feeding tube at bedtime as needed for anxiety (sleep).     [provider]  fexofenadine (ALLEGRA) 180 MG tablet Place 180 mg into feeding tube daily.    [provider]  Garlic Oil 1093 MG CAPS Give 1,000 mg by tube daily with breakfast.     [provider]  guaifenesin  (ROBITUSSIN) 100 MG/5ML syrup Take 200 mg by mouth 3 (three) times daily as needed for cough.    [provider]  HYDROcodone-acetaminophen (NORCO) 7.5-325 MG per tablet Place 1 tablet into feeding tube daily as needed (pain).     [provider]  ipratropium-albuterol (DUONEB) 0.5-2.5 (3) MG/3ML SOLN Take 3 mLs by nebulization See admin instructions. Four times daily and as needed for shortness of breath    [provider]  Lactobacillus (ACIDOPHILUS PO) Place 1 capsule into feeding tube daily.     [provider]  levETIRAcetam (KEPPRA) 500 MG tablet Take 3 tablets (1,500 mg total) by mouth 2 (two) times daily. 05/29/15   Jule Ser, DO  Multiple Vitamin (MULTIVITAMIN WITH MINERALS) TABS tablet Place 1 tablet into feeding tube daily. 01/24/13   Bonnielee Haff, MD  NALTREXONE HCL PO Give 4.5 mg by tube at bedtime.    [provider]  neomycin-bacitracin-polymyxin (NEOSPORIN) [phone] ointment Apply 1 application topically 4 (four) times daily as needed (abraisions, irritations).    [provider]  nitrofurantoin, macrocrystal-monohydrate, (MACROBID) 100 MG capsule Take 100 mg by mouth daily.  06/12/17   [provider]  Nutritional Supplements (FEEDING SUPPLEMENT, JEVITY 1.5 CAL,) LIQD Place 1,000 mLs into feeding tube daily.     [provider]  nystatin (MYCOSTATIN/NYSTOP) 100000 UNIT/GM POWD Apply 1 g topically 3 (three) times daily. Patient taking differently: Apply 1 g topically daily. Applies to groin 01/24/13   Bonnielee Haff, MD  omeprazole (PRILOSEC) 2 mg/mL SUSP Place 20 mLs (40 mg total) into feeding tube daily. 01/24/13   Bonnielee Haff, MD  polyethylene glycol [facility] Floria Raveling) packet Place 17 g into feeding tube daily. Patient taking differently: Place 17 g into feeding tube every evening.  01/24/13   Bonnielee Haff, MD  polyvinyl alcohol (LIQUIFILM TEARS) 1.4 % ophthalmic solution Place 1 drop into both  eyes 3 (three) times daily. Patient taking differently: Place 1 drop into both eyes 2 (two) times daily.  01/24/13   Bonnielee Haff, MD  predniSONE 5 MG/ML concentrated solution Place 2 mLs (10 mg total) into feeding tube daily with breakfast. Resume after taper. Patient not taking: Reported on 07/08/2017 05/10/16   Eugenie Filler, MD  Saline (SIMPLY SALINE) 0.9 % AERS Place 1 spray into both nostrils as needed (for nasal conjestion).    [provider]  senna (SENOKOT) 8.6 MG TABS tablet Take 2 tablets by mouth at bedtime.     [provider]  sodium phosphate (FLEET) enema Place 1 enema rectally once as needed (for constipation). follow package directions    [provider]  tiZANidine (ZANAFLEX) 2 MG tablet Take 1 tablet (2 mg total) by mouth every 6 (six) hours as needed for muscle spasms. Patient taking differently: Place 2 mg into feeding tube 3 (three) times daily.  11/25/15   Kathrynn Ducking, MD  vitamin C (ASCORBIC ACID) 500 MG tablet Place 500 mg into  feeding tube 2 (two) times daily.    [provider]  Water For Irrigation, Sterile (FREE WATER) SOLN Place 240 mLs into feeding tube every 4 (four) hours. 01/24/13   Bonnielee Haff, MD    Physical Exam: Vitals:   12/07/17 1545 12/07/17 1600 12/07/17 1645 12/07/17 1715  BP: 112/85 130/81 112/72 122/74  Pulse: (!) 117 (!) 117 (!) 117 (!) 117  Resp: (!) 23 (!) 21 (!) 23 (!) 21  Temp:      TempSrc:      SpO2: 96% 96% 93% 95%  Weight:      Height:          Constitutional: Chronically ill, obtunded male with contractures, poorly responsive, moans Vitals:   12/07/17 1545 12/07/17 1600 12/07/17 1645 12/07/17 1715  BP: 112/85 130/81 112/72 122/74  Pulse: (!) 117 (!) 117 (!) 117 (!) 117  Resp: (!) 23 (!) 21 (!) 23 (!) 21  Temp:      TempSrc:      SpO2: 96% 96% 93% 95%  Weight:      Height:       Eyes: Pupils are reactive ENMT: Oral mucosa is dry Respiratory: Clear bilaterally, no audible  rhonchi  cardiovascular: 1 S2/tachycardic Abdomen: Soft, nontender, PEG tube, scant drainage around PEG tube site, suprapubic catheter Musculoskeletal: Bilateral upper and lower extremities with contractures Ext: No edema, contracted Skin: no rashes, lesions, ulcers.  Neurologic: Nonverbal, quadriplegic, upper and lower extremities stiff and contracted Psychiatric: unable To assess  Labs on Admission: I have personally reviewed following labs and imaging studies  CBC: Recent Labs  Lab 12/07/17 1458  WBC 18.2*  NEUTROABS 17.0*  HGB 15.5  HCT 49.8  MCV 97.8  PLT 226   Basic Metabolic Panel: Recent Labs  Lab 12/07/17 1458  NA 139  K 4.2  CL 103  CO2 21*  GLUCOSE 114*  BUN 14  CREATININE 0.71  CALCIUM 9.7   GFR: Estimated Creatinine Clearance: 132.2 mL/min (by C-G formula based on SCr of 0.71 mg/dL). Liver Function Tests: Recent Labs  Lab 12/07/17 1458  AST 27  ALT 18  ALKPHOS 90  BILITOT 0.6  PROT 8.6*  ALBUMIN 4.4   No results for input(s): LIPASE, AMYLASE in the last 168 hours. No results for input(s): AMMONIA in the last 168 hours. Coagulation Profile: Recent Labs  Lab 12/07/17 1458  INR 1.03   Cardiac Enzymes: No results for input(s): CKTOTAL, CKMB, CKMBINDEX, TROPONINI in the last 168 hours. BNP (last 3 results) No results for input(s): PROBNP in the last 8760 hours. HbA1C: No results for input(s): HGBA1C in the last 72 hours. CBG: No results for input(s): GLUCAP in the last 168 hours. Lipid Profile: No results for input(s): CHOL, HDL, LDLCALC, TRIG, CHOLHDL, LDLDIRECT in the last 72 hours. Thyroid Function Tests: No results for input(s): TSH, T4TOTAL, FREET4, T3FREE, THYROIDAB in the last 72 hours. Anemia Panel: No results for input(s): VITAMINB12, FOLATE, FERRITIN, TIBC, IRON, RETICCTPCT in the last 72 hours. Urine analysis:    Component Value Date/Time   COLORURINE AMBER (A) 12/07/2017 1415   APPEARANCEUR HAZY (A) 12/07/2017 1415    LABSPEC 1.025 12/07/2017 1415   PHURINE 6.0 12/07/2017 1415   GLUCOSEU NEGATIVE 12/07/2017 1415   HGBUR NEGATIVE 12/07/2017 1415   BILIRUBINUR NEGATIVE 12/07/2017 1415   KETONESUR 5 (A) 12/07/2017 1415   PROTEINUR 100 (A) 12/07/2017 1415   UROBILINOGEN 0.2 10/20/2014 1328   NITRITE NEGATIVE 12/07/2017 1415   LEUKOCYTESUR LARGE (A) 12/07/2017 1415  Sepsis Labs: @LABRCNTIP (procalcitonin:4,lacticidven:4) )No results found for this or any previous visit (from the past 240 hour(s)).   Radiological Exams on Admission: Dg Chest Portable 1 View  Result Date: 12/07/2017 CLINICAL DATA:  Evaluate central line placement EXAM: PORTABLE CHEST 1 VIEW COMPARISON:  12/07/2017 FINDINGS: Right IJ catheter tip projects over the mid SVC. No pneumothorax identified. Heart size normal. No pleural effusion or edema. No airspace opacities. Diffuse gaseous distension of the bowel loops noted within the right abdomen. IMPRESSION: 1. Right IJ catheter tip projects over the mid SVC. 2. No pneumothorax. Electronically Signed   By: Kerby Moors M.D.   On: 12/07/2017 17:22   Dg Chest Portable 1 View  Result Date: 12/07/2017 CLINICAL DATA:  Fever and lethargy. EXAM: PORTABLE CHEST 1 VIEW COMPARISON:  07/07/2017, 03/03/2015 FINDINGS: Lungs are hypoinflated without focal airspace consolidation or effusion. Moderate air under the right hemidiaphragm likely within colon and similar to 03/03/2015. Cardiomediastinal silhouette and remainder of the exam is unchanged. IMPRESSION: No acute cardiopulmonary disease. Air under the right hemidiaphragm likely within prominent colonic loops and not significantly changed from 03/03/2015. Electronically Signed   By: Marin Olp M.D.   On: 12/07/2017 15:25     Assessment/Plan  Sepsis -Etiology not clear at this time urinary source is probable, UA abnormal with suprapubic catheter -Blood cultures and urine cultures sent -Broad-spectrum antibiotics namely vancomycin and cefepime  at this time -De-escalate in 24 to 48 hours -Given 2 L of saline bolus in the ED,, normal saline at 100 cc an hour -Given recent antibiotic exposure, there probability of negative cultures, especially urine culture -will Need suprapubic catheter exchanged this admission, pls consult catheter team on amion tomorrow  Toxic metabolic encephalopathy -In the background of advanced dementia -At baseline he is alert but nonverbal, communicates with his wife by blinking once or twice  Advanced multiple sclerosis with quadriplegia contractures, suprapubic catheter -PEG tube, bedbound, total care -Needs palliative care, goals of care discussions  Severe protein calorie malnutrition -Continue tube feeds  History of seizure disorder -Continue Keppra via G-tube  History of steroid-induced AI, 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

## 2017-12-08 ENCOUNTER — Inpatient Hospital Stay (HOSPITAL_COMMUNITY): Payer: Medicare HMO

## 2017-12-08 ENCOUNTER — Encounter (HOSPITAL_COMMUNITY): Payer: Self-pay | Admitting: Urology

## 2017-12-08 MED ORDER — LEVETIRACETAM 750 MG PO TABS: 1500.0000 mg | ORAL_TABLET | Freq: Two times a day (BID) | ORAL | Status: DC

## 2017-12-08 MED ORDER — SODIUM CHLORIDE 0.9 % IV BOLUS
1000.0000 mL | Freq: Once | INTRAVENOUS | Status: AC
  Administered 2017-12-08: 1000 mL via INTRAVENOUS

## 2017-12-08 MED ORDER — CRANBERRY JUICE POWDER 425 MG PO CAPS: 425.0000 mg | ORAL_CAPSULE | Freq: Two times a day (BID) | ORAL | Status: DC

## 2017-12-08 MED ORDER — DIAZEPAM 5 MG PO TABS: 5.0000 mg | ORAL_TABLET | Freq: Every evening | ORAL | Status: DC | PRN

## 2017-12-08 MED ORDER — POLYETHYLENE GLYCOL 3350 17 G PO PACK
17.0000 g | PACK | Freq: Every evening | ORAL | Status: DC
  Administered 2017-12-08 – 2017-12-09 (×2): 17 g
  Filled 2017-12-08 (×3): qty 1

## 2017-12-08 MED ORDER — JEVITY 1.5 CAL/FIBER PO LIQD
1000.0000 mL | ORAL | Status: DC
  Administered 2017-12-08 – 2017-12-09 (×3): 1000 mL
  Filled 2017-12-08 (×2): qty 1000

## 2017-12-08 MED ORDER — IPRATROPIUM-ALBUTEROL 0.5-2.5 (3) MG/3ML IN SOLN
3.0000 mL | Freq: Four times a day (QID) | RESPIRATORY_TRACT | Status: DC
  Administered 2017-12-08: 3 mL via RESPIRATORY_TRACT
  Filled 2017-12-08: qty 3

## 2017-12-08 MED ORDER — ONDANSETRON HCL 4 MG/2ML IJ SOLN: 4.0000 mg | Freq: Four times a day (QID) | INTRAMUSCULAR | Status: DC | PRN

## 2017-12-08 MED ORDER — HYDROCODONE-ACETAMINOPHEN 7.5-325 MG PO TABS: 1.0000 | ORAL_TABLET | Freq: Every day | ORAL | Status: DC | PRN

## 2017-12-08 MED ORDER — JEVITY 1.5 CAL PO LIQD: 1000.0000 mL | ORAL | Status: DC

## 2017-12-08 MED ORDER — IPRATROPIUM-ALBUTEROL 0.5-2.5 (3) MG/3ML IN SOLN: 3.0000 mL | RESPIRATORY_TRACT | Status: DC

## 2017-12-08 MED ORDER — PANTOPRAZOLE SODIUM 40 MG PO PACK
40.0000 mg | PACK | Freq: Every day | ORAL | Status: DC
  Administered 2017-12-08 – 2017-12-10 (×3): 40 mg
  Filled 2017-12-08 (×3): qty 20

## 2017-12-08 MED ORDER — FREE WATER
240.0000 mL | Status: DC
  Administered 2017-12-08 – 2017-12-10 (×15): 240 mL

## 2017-12-08 MED ORDER — BUPROPION HCL 75 MG PO TABS
75.0000 mg | ORAL_TABLET | Freq: Three times a day (TID) | ORAL | Status: DC
  Administered 2017-12-08 – 2017-12-10 (×8): 75 mg
  Filled 2017-12-08 (×9): qty 1

## 2017-12-08 MED ORDER — ENOXAPARIN SODIUM 40 MG/0.4ML ~~LOC~~ SOLN
40.0000 mg | SUBCUTANEOUS | Status: DC
  Administered 2017-12-08 – 2017-12-10 (×3): 40 mg via SUBCUTANEOUS
  Filled 2017-12-08 (×3): qty 0.4

## 2017-12-08 MED ORDER — DANTROLENE SODIUM 25 MG PO CAPS
50.0000 mg | ORAL_CAPSULE | Freq: Four times a day (QID) | ORAL | Status: DC
  Administered 2017-12-08 – 2017-12-10 (×9): 50 mg
  Filled 2017-12-08 (×12): qty 2

## 2017-12-08 MED ORDER — BISACODYL 10 MG RE SUPP: 10.0000 mg | Freq: Once | RECTAL | Status: DC | PRN

## 2017-12-08 MED ORDER — SENNA 8.6 MG PO TABS: 2.0000 | ORAL_TABLET | Freq: Every day | ORAL | Status: DC | PRN

## 2017-12-08 MED ORDER — BACLOFEN 20 MG PO TABS
20.0000 mg | ORAL_TABLET | Freq: Four times a day (QID) | ORAL | Status: DC
  Administered 2017-12-08 – 2017-12-10 (×9): 20 mg
  Filled 2017-12-08 (×11): qty 1

## 2017-12-08 MED ORDER — VANCOMYCIN HCL IN DEXTROSE 1-5 GM/200ML-% IV SOLN
1000.0000 mg | Freq: Two times a day (BID) | INTRAVENOUS | Status: DC
  Filled 2017-12-08: qty 200

## 2017-12-08 MED ORDER — IPRATROPIUM-ALBUTEROL 0.5-2.5 (3) MG/3ML IN SOLN
3.0000 mL | Freq: Two times a day (BID) | RESPIRATORY_TRACT | Status: DC
  Administered 2017-12-08 – 2017-12-10 (×4): 3 mL via RESPIRATORY_TRACT
  Filled 2017-12-08 (×4): qty 3

## 2017-12-08 MED ORDER — LACTATED RINGERS IV SOLN
INTRAVENOUS | Status: AC
  Administered 2017-12-08 – 2017-12-09 (×2): via INTRAVENOUS

## 2017-12-08 MED ORDER — SODIUM CHLORIDE 0.9 % IV SOLN
INTRAVENOUS | Status: DC
  Administered 2017-12-08: 07:00:00 via INTRAVENOUS

## 2017-12-08 MED ORDER — ONDANSETRON HCL 4 MG PO TABS: 4.0000 mg | ORAL_TABLET | Freq: Four times a day (QID) | ORAL | Status: DC | PRN

## 2017-12-08 MED ORDER — TIZANIDINE HCL 4 MG PO TABS: 2.0000 mg | ORAL_TABLET | Freq: Four times a day (QID) | ORAL | Status: DC | PRN

## 2017-12-08 NOTE — Progress Notes (Addendum)
PROGRESS NOTE                                                                                                                                                                                                             Patient Demographics:    Terry Palmer, is a 43 y.o. male, DOB - [birth year], SEG:[id number]  Admit date - 12/07/2017   Admitting Physician Domenic Polite, MD  Outpatient Primary MD for the patient is [name], MD  LOS - 1  Chief Complaint  Patient presents with  . Fever  . Fatigue       Brief Narrative    Terry Palmer is a 42 y.o. male with medical history of advanced multiple sclerosis, nonverbal at baseline, quadriplegic with contractures, dementia, suprapubic catheter, PEG tube, history of seizures brought to the emergency room by his family due to worsening lethargy x1 day.  He had recently finished a 10-day course of antibiotics for UTI, this time he was admitted for sepsis with likely source again being urine.   Subjective:    Terry Palmer today bed, appears to be in no discomfort, at baseline he has functional quadriplegia and is nonverbal.   Assessment  & Plan :     1.  Sepsis due to UTI.  Patient has chronic indwelling Foley catheter, this will be changed, discussed with with urologist Dr. Jeffie Pollock, continue empiric cefepime, sepsis physiology seems to be improving, will give another bolus of IV fluids followed by maintenance IV fluids.  Will hold vancomycin, continue cefepime and follow cultures.  Long-term prognosis very poor.   Note discussed with urologist.  Will obtain imaging to rule out ureteric stone.   2.  Advanced underlying MS causing functional quadriplegia, dysphagia with chronic indwelling PEG, neurogenic bladder with chronic suprapubic catheter.  Supportive care.  3.  History of seizures.  On Keppra IV.  4.  Toxic metabolic encephalopathy in a patient with underlying advanced dementia as well.  Baseline nonverbal  but communicates with family with blinking of eyes, treat underlying cause as a #1 and monitor.  5.  Severe protein calorie malnutrition.  Tube feeds via PEG tube continued.  6.  Steroid-induced adrenal insufficiency.  Monitor closely.  Currently not on steroids.    Family Communication  :  None present  Code Status :  Full  Disposition Plan  :  TBD  Consults  :  None  Procedures  :      DVT Prophylaxis  :  Lovenox  Lab Results  Component Value Date   PLT 280 12/07/2017    Diet :  Diet Order            Diet NPO time specified  Diet effective now               Inpatient Medications Scheduled Meds: . baclofen  20 mg Per Tube QID  . buPROPion  75 mg Per Tube TID  . dantrolene  50 mg Per Tube QID  . enoxaparin (LOVENOX) injection  40 mg Subcutaneous Q24H  . feeding supplement (JEVITY 1.5 CAL/FIBER)  1,000 mL Per Tube Q24H  . free water  240 mL Per Tube Q4H  . ipratropium-albuterol  3 mL Nebulization BID  . levETIRAcetam  1,500 mg Per Tube BID  . pantoprazole sodium  40 mg Per Tube Daily  . polyethylene glycol  17 g Per Tube QPM   Continuous Infusions: . sodium chloride 100 mL/hr at 12/08/17 0659  . ceFEPime (MAXIPIME) IV 1 g (12/08/17 0853)  . sodium chloride    . vancomycin     PRN Meds:.acetaminophen, bisacodyl, diazepam, HYDROcodone-acetaminophen, ondansetron **OR** ondansetron (ZOFRAN) IV, senna, tiZANidine  Antibiotics  :   Anti-infectives (From admission, onward)   Start     Dose/Rate Route Frequency Ordered Stop   12/08/17 1349  vancomycin (VANCOCIN) IVPB 1000 mg/200 mL premix     1,000 mg 200 mL/hr over 60 Minutes Intravenous Every 12 hours 12/08/17 0609     12/08/17 0100  vancomycin (VANCOCIN) IVPB 1000 mg/200 mL premix  Status:  Discontinued     1,000 mg 200 mL/hr over 60 Minutes Intravenous Every 8 hours 12/07/17 1542 12/08/17 0609   12/08/17 0000  ceFEPIme (MAXIPIME) 1 g in sodium chloride 0.9 % 100 mL IVPB     1 g 200 mL/hr over 30  Minutes Intravenous Every 8 hours 12/07/17 1542     12/07/17 1530  ceFEPIme (MAXIPIME) 2 g in sodium chloride 0.9 % 100 mL IVPB     2 g 200 mL/hr over 30 Minutes Intravenous  Once 12/07/17 1517 12/07/17 1821   12/07/17 1530  metroNIDAZOLE (FLAGYL) IVPB 500 mg  Status:  Discontinued     500 mg 100 mL/hr over 60 Minutes Intravenous Every 8 hours 12/07/17 1517 12/08/17 0550   12/07/17 1530  vancomycin (VANCOCIN) IVPB 1000 mg/200 mL premix  Status:  Discontinued     1,000 mg 200 mL/hr over 60 Minutes Intravenous  Once 12/07/17 1517 12/07/17 1518   12/07/17 1530  vancomycin (VANCOCIN) 1,500 mg in sodium chloride 0.9 % 500 mL IVPB     1,500 mg 250 mL/hr over 120 Minutes Intravenous  Once 12/07/17 1518 12/08/17 0402          Objective:   Vitals:   12/07/17 1930 12/07/17 2013 12/08/17 0100 12/08/17 0829  BP: 130/74   105/67  Pulse: (!) 107   (!) 109  Resp: 16   12  Temp: 98 F (36.7 C)  98.9 F (37.2 C)   TempSrc: Oral  Axillary   SpO2: 95%   93%  Weight:  79.3 kg    Height:        Wt Readings from Last 3 Encounters:  12/07/17 79.3 kg  07/08/17 79.3 kg  04/27/16 80.3 kg     Intake/Output Summary (Last 24 hours) at 12/08/2017 0922 Last data filed at 12/08/2017 0500 Gross per 24 hour  Intake 2950 ml  Output --  Net 2950 ml     Physical Exam  Awake but non verbal, severe contractures leaning towards the right side, right IJ line in place, suprapubic catheter in place, PEG tube in place, functional quadriplegia [place].AT,PERRAL Supple Neck,No JVD, No cervical lymphadenopathy appriciated.  Symmetrical Chest wall movement, Good air movement bilaterally, CTAB RRR,No Gallops,Rubs or new Murmurs, No Parasternal Heave +ve B.Sounds, Abd Soft, No tenderness, No organomegaly appriciated, No rebound - guarding or rigidity. No Cyanosis, Clubbing severe chronic contractures     Data Review:    CBC Recent Labs  Lab 12/07/17 1458  WBC 18.2*  HGB 15.5  HCT 49.8  PLT 280  MCV  97.8  MCH 30.5  MCHC 31.1  RDW 13.1  LYMPHSABS 0.7  MONOABS 0.3  EOSABS 0.0  BASOSABS 0.1    Chemistries  Recent Labs  Lab 12/07/17 1458  NA 139  K 4.2  CL 103  CO2 21*  GLUCOSE 114*  BUN 14  CREATININE 0.71  CALCIUM 9.7  AST 27  ALT 18  ALKPHOS 90  BILITOT 0.6   ------------------------------------------------------------------------------------------------------------------ No results for input(s): CHOL, HDL, LDLCALC, TRIG, CHOLHDL, LDLDIRECT in the last 72 hours.  No results found for: HGBA1C ------------------------------------------------------------------------------------------------------------------ No results for input(s): TSH, T4TOTAL, T3FREE, THYROIDAB in the last 72 hours.  Invalid input(s): FREET3 ------------------------------------------------------------------------------------------------------------------ No results for input(s): VITAMINB12, FOLATE, FERRITIN, TIBC, IRON, RETICCTPCT in the last 72 hours.  Coagulation profile Recent Labs  Lab 12/07/17 1458  INR 1.03    No results for input(s): DDIMER in the last 72 hours.  Cardiac Enzymes No results for input(s): CKMB, TROPONINI, MYOGLOBIN in the last 168 hours.  Invalid input(s): CK ------------------------------------------------------------------------------------------------------------------ No results found for: BNP  Micro Results No results found for this or any previous visit (from the past 240 hour(s)).  Radiology Reports Dg Chest Portable 1 View  Result Date: 12/07/2017 CLINICAL DATA:  Evaluate central line placement EXAM: PORTABLE CHEST 1 VIEW COMPARISON:  12/07/2017 FINDINGS: Right IJ catheter tip projects over the mid SVC. No pneumothorax identified. Heart size normal. No pleural effusion or edema. No airspace opacities. Diffuse gaseous distension of the bowel loops noted within the right abdomen. IMPRESSION: 1. Right IJ catheter tip projects over the mid SVC. 2. No  pneumothorax. Electronically Signed   By: Kerby Moors M.D.   On: 12/07/2017 17:22   Dg Chest Portable 1 View  Result Date: 12/07/2017 CLINICAL DATA:  Fever and lethargy. EXAM: PORTABLE CHEST 1 VIEW COMPARISON:  07/07/2017, 03/03/2015 FINDINGS: Lungs are hypoinflated without focal airspace consolidation or effusion. Moderate air under the right hemidiaphragm likely within colon and similar to 03/03/2015. Cardiomediastinal silhouette and remainder of the exam is unchanged. IMPRESSION: No acute cardiopulmonary disease. Air under the right hemidiaphragm likely within prominent colonic loops and not significantly changed from 03/03/2015. Electronically Signed   By: Marin Olp M.D.   On: 12/07/2017 15:25    Time Spent in minutes  30   Lala Lund M.D on 12/08/2017 at 9:22 AM  To page go to [email or web address] - password [email or web address]

## 2017-12-08 NOTE — Progress Notes (Signed)
Pt arrived to unit around 2030, Pt non verbal, quadreplegic and family member not present on admission. Pt present with IJ with three lumen on the right neck. Pt also have a peg tube, a suprapubic cath. Skin intact with old heal wound scare on his bottoms. Pt had elevated HR and also decrease oxygen level to the 80. Suction pt, applied O2 and call respiratory.

## 2017-12-08 NOTE — Consult Note (Addendum)
Subjective: CC: lethargy  Hx: I was asked to see Mr. Oakley for a suprapubic tube exchang by Dr. Candiss Norse.  He is a 42 yo male quadriplegic with MS and a chronic SP tube.  He is admitted with possible urosepsis.  He is non-verbal.  ROS:  Review of Systems  Unable to perform ROS: Patient nonverbal    No Known Allergies  Past Medical History:  Diagnosis Date  . Aspiration pneumonia ([place]) 01/20/2013  . Bladder calculi   . Childhood asthma   . 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 Depression   . Dysphagia   . 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 MS (multiple sclerosis) ([place])   . Neurogenic bladder   . Neuromuscular disorder ([place])    Quadraperesis  . Normocytic anemia 05/28/2011  . Quadriparesis (muscle weakness) ([place]) 03/12/2011  . Quadriplegia and quadriparesis ([place]) 12/24/2014  . Recurrent upper respiratory infection (URI)   . Recurrent UTI   . Seizure disorder ([place])   . Shortness of breath     Past Surgical History:  Procedure Laterality Date  . GASTROSTOMY  04/16/2011   Procedure: GASTROSTOMY;  Surgeon: Zenovia Jarred, MD;  Location: [place];  Service: General;  Laterality: N/A;  Open G-Tube placement  . LUMBAR PUNCTURE  10/12/2002    Social History   Socioeconomic History  . Marital status: Married    Spouse name: Not on file  . Number of children: 2  . Years of education: Not on file  . Highest education level: Not on file  Occupational History  . Not on file  Social Needs  . Financial resource strain: Not on file  . Food insecurity:    Worry: Not on file    Inability: Not on file  . Transportation needs:    Medical: Not on file    Non-medical: Not on file  Tobacco Use  . Smoking status: Former Smoker    Types: Cigarettes    Last attempt to quit: 02/02/1999    Years since quitting: 18.8  . Smokeless tobacco: Former Network engineer and Sexual Activity  . Alcohol use: No  . Drug use: Not on file    Comment: Former use  . Sexual activity:  Never  Lifestyle  . Physical activity:    Days per week: Not on file    Minutes per session: Not on file  . Stress: Not on file  Relationships  . Social connections:    Talks on phone: Not on file    Gets together: Not on file    Attends religious service: Not on file    Active member of club or organization: Not on file    Attends meetings of clubs or organizations: Not on file    Relationship status: Not on file  . Intimate partner violence:    Fear of current or ex partner: Not on file    Emotionally abused: Not on file    Physically abused: Not on file    Forced sexual activity: Not on file  Other Topics Concern  . Not on file  Social History Narrative   The patient currently lives at home with his wife and kids, who help him with his ADL's.  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  The patient currently has [facility], and is in the process of reapplying for Medicaid (12/2010).    Avry Roedl (cell) [phone]; (home) [phone],  Tyquarius Paglia (cell) [phone] (home) [phone]    Family History  Problem Relation Age of Onset  . Asthma Mother     Anti-infectives: Anti-infectives (From admission, onward)   Start     Dose/Rate Route Frequency Ordered Stop   12/08/17 1349  vancomycin (VANCOCIN) IVPB 1000 mg/200 mL premix  Status:  Discontinued     1,000 mg 200 mL/hr over 60 Minutes Intravenous Every 12 hours 12/08/17 0609 12/08/17 0924   12/08/17 0100  vancomycin (VANCOCIN) IVPB 1000 mg/200 mL premix  Status:  Discontinued     1,000 mg 200 mL/hr over 60 Minutes Intravenous Every 8 hours 12/07/17 1542 12/08/17 0609   12/08/17 0000  ceFEPIme (MAXIPIME) 1 g in sodium chloride 0.9 % 100 mL IVPB     1 g 200 mL/hr over 30 Minutes Intravenous Every 8 hours 12/07/17 1542     12/07/17 1530  ceFEPIme (MAXIPIME) 2 g in sodium chloride 0.9 % 100 mL IVPB     2 g 200 mL/hr over 30  Minutes Intravenous  Once 12/07/17 1517 12/07/17 1821   12/07/17 1530  metroNIDAZOLE (FLAGYL) IVPB 500 mg  Status:  Discontinued     500 mg 100 mL/hr over 60 Minutes Intravenous Every 8 hours 12/07/17 1517 12/08/17 0550   12/07/17 1530  vancomycin (VANCOCIN) IVPB 1000 mg/200 mL premix  Status:  Discontinued     1,000 mg 200 mL/hr over 60 Minutes Intravenous  Once 12/07/17 1517 12/07/17 1518   12/07/17 1530  vancomycin (VANCOCIN) 1,500 mg in sodium chloride 0.9 % 500 mL IVPB     1,500 mg 250 mL/hr over 120 Minutes Intravenous  Once 12/07/17 1518 12/08/17 0402      Current Facility-Administered Medications  Medication Dose Route Frequency Provider Last Rate Last Dose  . acetaminophen (TYLENOL) tablet 500 mg  500 mg Per Tube Q4H PRN Domenic Polite, MD   500 mg at 12/07/17 1909  . baclofen (LIORESAL) tablet 20 mg  20 mg Per Tube QID Domenic Polite, MD   20 mg at 12/08/17 1058  . bisacodyl (DULCOLAX) suppository 10 mg  10 mg Rectal Once PRN Domenic Polite, MD      . buPROPion [facility]) tablet 75 mg  75 mg Per Tube TID Domenic Polite, MD   75 mg at 12/08/17 1057  . ceFEPIme (MAXIPIME) 1 g in sodium chloride 0.9 % 100 mL IVPB  1 g Intravenous Q8H Domenic Polite, MD 200 mL/hr at 12/08/17 0853 1 g at 12/08/17 0853  . dantrolene (DANTRIUM) capsule 50 mg  50 mg Per Tube QID Domenic Polite, MD   50 mg at 12/08/17 1059  . diazepam (VALIUM) tablet 5 mg  5 mg Per Tube QHS PRN Domenic Polite, MD      . enoxaparin (LOVENOX) injection 40 mg  40 mg Subcutaneous Q24H Domenic Polite, MD   40 mg at 12/08/17 0656  . feeding supplement (JEVITY 1.5 CAL/FIBER) liquid 1,000 mL  1,000 mL Per Tube Q24H Domenic Polite, MD   1,000 mL at 12/08/17 0644  . free water 240 mL  240 mL Per Tube Q4H Domenic Polite, MD   240 mL at 12/08/17 0657  . HYDROcodone-acetaminophen (NORCO) 7.5-325 MG per tablet 1 tablet  1 tablet Per Tube Daily PRN Domenic Polite, MD      . ipratropium-albuterol (DUONEB) 0.5-2.5 (3) MG/3ML  nebulizer solution 3 mL  3 mL Nebulization BID Thurnell Lose, MD      . lactated ringers infusion   Intravenous Continuous Lala Lund  K, MD      . levETIRAcetam (KEPPRA) 100 MG/ML solution 1,500 mg  1,500 mg Per Tube BID Domenic Polite, MD   1,500 mg at 12/08/17 1102  . ondansetron (ZOFRAN) injection 4 mg  4 mg Intravenous Q6H PRN Domenic Polite, MD      . pantoprazole sodium (PROTONIX) 40 mg/20 mL oral suspension 40 mg  40 mg Per Tube Daily Domenic Polite, MD   40 mg at 12/08/17 1057  . polyethylene glycol (MIRALAX / GLYCOLAX) packet 17 g  17 g Per Tube QPM Domenic Polite, MD      . senna [facility]) tablet 17.2 mg  2 tablet Per Tube Daily PRN Domenic Polite, MD      . sodium chloride 0.9 % bolus 1,000 mL  1,000 mL Intravenous Once Thurnell Lose, MD 983.6 mL/hr at 12/08/17 1055 1,000 mL at 12/08/17 1055  . sodium chloride 0.9 % bolus 500 mL  500 mL Intravenous Once Domenic Polite, MD      . tiZANidine (ZANAFLEX) tablet 2 mg  2 mg Per Tube Q6H PRN Domenic Polite, MD         Objective: Vital signs in last 24 hours: Temp:  [98 F (36.7 C)-101.6 F (38.7 C)] 98.9 F (37.2 C) (12/01 0100) Pulse Rate:  [107-129] 109 (12/01 0829) Resp:  [12-24] 12 (12/01 0829) BP: (105-147)/(67-98) 105/67 (12/01 0829) SpO2:  [93 %-97 %] 93 % (12/01 0829) Weight:  [79.3 kg] 79.3 kg (11/30 2013)  Intake/Output from previous day: 11/30 0701 - 12/01 0700 In: 2950 [IV Piggyback:2950] Out: -  Intake/Output this shift: No intake/output data recorded.   Physical Exam  Constitutional:  Unresponsive quadriplegic with severe contractures.   Abdominal:  SP tube in suprapubic area with no site abnormalities.    Musculoskeletal:  Severe extremity contractures.   Neurological:  Non-verbal with spastic quadriplegia.   Vitals reviewed.   Lab Results:  Recent Labs    12/07/17 1458  WBC 18.2*  HGB 15.5  HCT 49.8  PLT 280   BMET Recent Labs    12/07/17 1458  NA 139  K 4.2  CL 103   CO2 21*  GLUCOSE 114*  BUN 14  CREATININE 0.71  CALCIUM 9.7   PT/INR Recent Labs    12/07/17 1458  LABPROT 13.4  INR 1.03   ABG No results for input(s): PHART, HCO3 in the last 72 hours.  Invalid input(s): PCO2, PO2  Studies/Results: Dg Chest Portable 1 View  Result Date: 12/07/2017 CLINICAL DATA:  Evaluate central line placement EXAM: PORTABLE CHEST 1 VIEW COMPARISON:  12/07/2017 FINDINGS: Right IJ catheter tip projects over the mid SVC. No pneumothorax identified. Heart size normal. No pleural effusion or edema. No airspace opacities. Diffuse gaseous distension of the bowel loops noted within the right abdomen. IMPRESSION: 1. Right IJ catheter tip projects over the mid SVC. 2. No pneumothorax. Electronically Signed   By: Kerby Moors M.D.   On: 12/07/2017 17:22   Dg Chest Portable 1 View  Result Date: 12/07/2017 CLINICAL DATA:  Fever and lethargy. EXAM: PORTABLE CHEST 1 VIEW COMPARISON:  07/07/2017, 03/03/2015 FINDINGS: Lungs are hypoinflated without focal airspace consolidation or effusion. Moderate air under the right hemidiaphragm likely within colon and similar to 03/03/2015. Cardiomediastinal silhouette and remainder of the exam is unchanged. IMPRESSION: No acute cardiopulmonary disease. Air under the right hemidiaphragm likely within prominent colonic loops and not significantly changed from 03/03/2015. Electronically Signed   By: Marin Olp M.D.   On: 12/07/2017 15:25  I have reviewed the hospital notes and labs and also his most recent renal imaging which was an [place] in 2/18 and a CT in 2017.  He had no stones and the SP tube was in place in 2017.   Last Urology note I find [facility].  His 54fr SP tube was changed using sterile technique.  The balloon was filled with 24ml of water and the catheter was placed to a drainage bag.   Assessment: Neurogenic bladder with suprapubic tube and sepsis with probable urinary origin.  Consider upper tract  imaging with CT or [place] since not done in 18 months.    CC: Dr. Lala Lund.      Irine Seal 12/08/2017 [phone]

## 2017-12-09 ENCOUNTER — Encounter (HOSPITAL_COMMUNITY): Payer: Self-pay | Admitting: Primary Care

## 2017-12-09 ENCOUNTER — Other Ambulatory Visit: Payer: Self-pay

## 2017-12-09 DIAGNOSIS — A419 Sepsis, unspecified organism: Secondary | ICD-10-CM

## 2017-12-09 DIAGNOSIS — N390 Urinary tract infection, site not specified: Secondary | ICD-10-CM

## 2017-12-09 DIAGNOSIS — G35 Multiple sclerosis: Secondary | ICD-10-CM

## 2017-12-09 DIAGNOSIS — Z515 Encounter for palliative care: Secondary | ICD-10-CM

## 2017-12-09 DIAGNOSIS — Z7189 Other specified counseling: Secondary | ICD-10-CM

## 2017-12-09 LAB — BASIC METABOLIC PANEL
Anion gap: 3 — ABNORMAL LOW (ref 5–15)
BUN: 8 mg/dL (ref 6–20)
CO2: 31 mmol/L (ref 22–32)
Calcium: 8.3 mg/dL — ABNORMAL LOW (ref 8.9–10.3)
Chloride: 104 mmol/L (ref 98–111)
Creatinine, Ser: 0.57 mg/dL — ABNORMAL LOW (ref 0.61–1.24)
GFR calc Af Amer: >60 mL/min (ref >60)
GFR calc non Af Amer: >60 mL/min (ref >60)
Glucose, Bld: 113 mg/dL — ABNORMAL HIGH (ref 70–99)
Potassium: 2.9 mmol/L — ABNORMAL LOW (ref 3.5–5.1)
Sodium: 138 mmol/L (ref 135–145)

## 2017-12-09 LAB — CBC
HCT: 37 % — ABNORMAL LOW (ref 39.0–52.0)
Hemoglobin: 11.9 g/dL — ABNORMAL LOW (ref 13.0–17.0)
MCH: 29.5 pg (ref 26.0–34.0)
MCHC: 32.2 g/dL (ref 30.0–36.0)
MCV: 91.6 fL (ref 80.0–100.0)
Platelets: 216 10*3/uL (ref 150–400)
RBC: 4.04 MIL/uL — ABNORMAL LOW (ref 4.22–5.81)
RDW: 13.1 % (ref 11.5–15.5)
WBC: 14.2 10*3/uL — ABNORMAL HIGH (ref 4.0–10.5)
nRBC: 0 % (ref 0.0–0.2)

## 2017-12-09 LAB — MRSA PCR SCREENING: MRSA by PCR: NEGATIVE

## 2017-12-09 LAB — MAGNESIUM: Magnesium: 1.9 mg/dL (ref 1.7–2.4)

## 2017-12-09 MED ORDER — JEVITY 1.2 CAL PO LIQD: 1000.0000 mL | ORAL | Status: DC

## 2017-12-09 MED ORDER — SODIUM CHLORIDE 0.9 % IV SOLN
1.0000 g | INTRAVENOUS | Status: DC
  Administered 2017-12-09 – 2017-12-10 (×2): 1 g via INTRAVENOUS
  Filled 2017-12-09 (×2): qty 10

## 2017-12-09 MED ORDER — INFLUENZA VAC SPLIT QUAD 0.5 ML IM SUSY
0.5000 mL | PREFILLED_SYRINGE | INTRAMUSCULAR | Status: DC
  Filled 2017-12-09: qty 0.5

## 2017-12-09 MED ORDER — PNEUMOCOCCAL VAC POLYVALENT 25 MCG/0.5ML IJ INJ
0.5000 mL | INJECTION | INTRAMUSCULAR | Status: AC
  Administered 2017-12-10: 0.5 mL via INTRAMUSCULAR
  Filled 2017-12-09: qty 0.5

## 2017-12-09 MED ORDER — SODIUM CHLORIDE 0.9 % IV SOLN
INTRAVENOUS | Status: DC | PRN
  Administered 2017-12-09: 1000 mL via INTRAVENOUS
  Administered 2017-12-10: 250 mL via INTRAVENOUS

## 2017-12-09 MED ORDER — ORAL CARE MOUTH RINSE
15.0000 mL | Freq: Two times a day (BID) | OROMUCOSAL | Status: DC
  Administered 2017-12-09 – 2017-12-10 (×3): 15 mL via OROMUCOSAL

## 2017-12-09 MED ORDER — ADULT MULTIVITAMIN LIQUID CH
15.0000 mL | Freq: Every day | ORAL | Status: DC
  Administered 2017-12-09 – 2017-12-10 (×2): 15 mL via ORAL
  Filled 2017-12-09 (×2): qty 15

## 2017-12-09 MED ORDER — POTASSIUM CHLORIDE 10 MEQ/100ML IV SOLN
10.0000 meq | INTRAVENOUS | Status: AC
  Administered 2017-12-09 (×4): 10 meq via INTRAVENOUS
  Filled 2017-12-09 (×5): qty 100

## 2017-12-09 MED ORDER — SODIUM CHLORIDE 0.9% FLUSH: 10.0000 mL | INTRAVENOUS | Status: DC | PRN

## 2017-12-09 MED ORDER — JEVITY 1.5 CAL/FIBER PO LIQD
1000.0000 mL | ORAL | Status: DC
  Administered 2017-12-10: 1000 mL
  Filled 2017-12-09 (×2): qty 1000

## 2017-12-09 NOTE — Care Management Note (Addendum)
Case Management Note  Patient Details  Name: Terry Palmer MRN: [id number] Date of Birth: 1975-01-12  Subjective/Objective:  Sepsis/UTI. 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 From home with wife with home health services/ skilled nursing following PTA, [facility] (2W-0V), [phone]. Failed ABX therapy outpatient for UTI.           Elizeo Rodriques (Spouse) Leslye Peer (Mother)    [phone]      Action/Plan: NCM called wife via phone and voice message was left to discuss discharge planning/ TOC needs....NCM awaiting returned call from wife.  Pt will probably need PTAR services for transportation to home once d/c.  Expected Discharge Date:                  Expected Discharge Plan:  [place]  In-House Referral:     Discharge planning Services  CM Consult  Post Acute Care Choice:    Choice offered to:     DME Arranged:   N/A DME Agency:    N/A HH Arranged:   N/A HH Agency:   N/A [facility] (0P-4J), [phone], [facility] [place] D/C. Status of Service:  In process, will continue to follow  If discussed at Long Length of Stay Meetings, dates discussed:    Additional Comments:  Sharin Mons, RN 12/09/2017, 10:00 AM

## 2017-12-09 NOTE — Consult Note (Signed)
[place] Nurse wound consult note Reason for Consult:Sacral pressure injuries. No open lesions found.  Healed pressure injuries with scarring (full thickness) in the sacral, ischial tuberosity areas. Wound type:moisture Pressure Injury POA: Healed full thickness pressure injuries with scarring. Measurement:N/A Wound bed:N/A Drainage (amount, consistency, odor) N/A Periwound: Mild MASD from fecal incontinence.  Dressing procedure/placement/frequency: Guidance is provided for Nursing staff regarding turning and repositioning and skin care following incontinence as well as for moisturizing of heels.  [place] nursing team will not follow, but will remain available to this patient, the nursing and medical teams.  Please re-consult if needed. Thanks, Maudie Flakes, MSN, RN, [place], Arther Abbott  Pager# [phone]

## 2017-12-09 NOTE — Progress Notes (Signed)
PROGRESS NOTE                                                                                                                                                                                                             Patient Demographics:    Terry Palmer, is a 42 y.o. male, DOB - 06/09/1975, LPF:[id number]  Admit date - 12/07/2017   Admitting Physician Domenic Polite, MD  Outpatient Primary MD for the patient is [name], MD  LOS - 2  Chief Complaint  Patient presents with  . Fever  . Fatigue       Brief Narrative    Terry Palmer is a 42 y.o. male with medical history of advanced multiple sclerosis, nonverbal at baseline, quadriplegic with contractures, dementia, suprapubic catheter, PEG tube, history of seizures brought to the emergency room by his family due to worsening lethargy x1 day.  He had recently finished a 10-day course of antibiotics for UTI, this time he was admitted for sepsis with likely source again being urine.   Subjective:    Terry Palmer today bed, appears to be in no discomfort, at baseline he has functional quadriplegia and is nonverbal.   Assessment  & Plan :     1.  Sepsis due to UTI due to chronic indwelling Foley catheter present on admission.  Supportive care which included IV antibiotics and IV fluids sepsis pathophysiology has resolved, suprapubic catheter was changed by urologist Dr. Jeffie Pollock on 12/08/2017.  Clinically stable will transition to IV Rocephin.  Discontinue other antibiotics if clinically stable will discharge on oral Keflex 5 PEG tube for 7 to 10 days for complex UTI with sepsis on 12/10/2017.  Long-term prognosis again remains very guarded.  CT stone protocol unremarkable.  2.  Advanced underlying MS causing functional quadriplegia, dysphagia with chronic indwelling PEG, neurogenic bladder with chronic suprapubic catheter.  Supportive care.  3.  History of seizures.  On Keppra IV.  4.  Toxic metabolic  encephalopathy in a patient with underlying advanced dementia as well.  Baseline nonverbal but communicates with family with blinking of eyes, treat underlying cause as a #1 and monitor.  5.  Severe protein calorie malnutrition.  Tube feeds via PEG tube continued.  6.  Steroid-induced adrenal insufficiency.  Monitor closely.  Currently not on steroids.    Family Communication  :  None present  Code Status :  Full  Disposition Plan  :  TBD  Consults  :  None  Procedures  :  DVT Prophylaxis  :  Lovenox    Lab Results  Component Value Date   PLT 216 12/09/2017    Diet :  Diet Order            Diet NPO time specified  Diet effective now               Inpatient Medications Scheduled Meds: . baclofen  20 mg Per Tube QID  . buPROPion  75 mg Per Tube TID  . dantrolene  50 mg Per Tube QID  . enoxaparin (LOVENOX) injection  40 mg Subcutaneous Q24H  . feeding supplement (JEVITY 1.5 CAL/FIBER)  1,000 mL Per Tube Q24H  . free water  240 mL Per Tube Q4H  . ipratropium-albuterol  3 mL Nebulization BID  . levETIRAcetam  1,500 mg Per Tube BID  . pantoprazole sodium  40 mg Per Tube Daily  . polyethylene glycol  17 g Per Tube QPM   Continuous Infusions: . ceFEPime (MAXIPIME) IV 1 g (12/08/17 2336)  . lactated ringers 75 mL/hr at 12/09/17 0405  . potassium chloride    . sodium chloride     PRN Meds:.acetaminophen, bisacodyl, diazepam, HYDROcodone-acetaminophen, [DISCONTINUED] ondansetron **OR** ondansetron (ZOFRAN) IV, senna, sodium chloride flush, tiZANidine  Antibiotics  :   Anti-infectives (From admission, onward)   Start     Dose/Rate Route Frequency Ordered Stop   12/08/17 1349  vancomycin (VANCOCIN) IVPB 1000 mg/200 mL premix  Status:  Discontinued     1,000 mg 200 mL/hr over 60 Minutes Intravenous Every 12 hours 12/08/17 0609 12/08/17 0924   12/08/17 0100  vancomycin (VANCOCIN) IVPB 1000 mg/200 mL premix  Status:  Discontinued     1,000 mg 200 mL/hr over 60  Minutes Intravenous Every 8 hours 12/07/17 1542 12/08/17 0609   12/08/17 0000  ceFEPIme (MAXIPIME) 1 g in sodium chloride 0.9 % 100 mL IVPB     1 g 200 mL/hr over 30 Minutes Intravenous Every 8 hours 12/07/17 1542     12/07/17 1530  ceFEPIme (MAXIPIME) 2 g in sodium chloride 0.9 % 100 mL IVPB     2 g 200 mL/hr over 30 Minutes Intravenous  Once 12/07/17 1517 12/07/17 1821   12/07/17 1530  metroNIDAZOLE (FLAGYL) IVPB 500 mg  Status:  Discontinued     500 mg 100 mL/hr over 60 Minutes Intravenous Every 8 hours 12/07/17 1517 12/08/17 0550   12/07/17 1530  vancomycin (VANCOCIN) IVPB 1000 mg/200 mL premix  Status:  Discontinued     1,000 mg 200 mL/hr over 60 Minutes Intravenous  Once 12/07/17 1517 12/07/17 1518   12/07/17 1530  vancomycin (VANCOCIN) 1,500 mg in sodium chloride 0.9 % 500 mL IVPB     1,500 mg 250 mL/hr over 120 Minutes Intravenous  Once 12/07/17 1518 12/08/17 0402          Objective:   Vitals:   12/08/17 2135 12/09/17 0410 12/09/17 0755 12/09/17 0814  BP:  122/73    Pulse: 85     Resp: 13     Temp:    98.7 F (37.1 C)  TempSrc:    Axillary  SpO2:  100% 95%   Weight:      Height:        Wt Readings from Last 3 Encounters:  12/07/17 79.3 kg  07/08/17 79.3 kg  04/27/16 80.3 kg     Intake/Output Summary (Last 24 hours) at 12/09/2017 0855 Last data filed at 12/09/2017 0600 Gross per 24 hour  Intake 2080 ml  Output 1550 ml  Net 530 ml     Physical Exam  Awake but non verbal, severe contractures leaning towards the right side, right IJ line in place, suprapubic catheter in place, PEG tube in place, functional quadriplegia [place].AT,PERRAL Supple Neck,No JVD, No cervical lymphadenopathy appriciated.  Symmetrical Chest wall movement, Good air movement bilaterally, CTAB RRR,No Gallops, Rubs or new Murmurs, No Parasternal Heave +ve B.Sounds, Abd Soft, No tenderness, No organomegaly appriciated, No rebound - guarding or rigidity. No Cyanosis, Clubbing or edema, No  new Rash or bruise     Data Review:    CBC Recent Labs  Lab 12/07/17 1458 12/09/17 0334  WBC 18.2* 14.2*  HGB 15.5 11.9*  HCT 49.8 37.0*  PLT 280 216  MCV 97.8 91.6  MCH 30.5 29.5  MCHC 31.1 32.2  RDW 13.1 13.1  LYMPHSABS 0.7  --   MONOABS 0.3  --   EOSABS 0.0  --   BASOSABS 0.1  --     Chemistries  Recent Labs  Lab 12/07/17 1458 12/09/17 0334  NA 139 138  K 4.2 2.9*  CL 103 104  CO2 21* 31  GLUCOSE 114* 113*  BUN 14 8  CREATININE 0.71 0.57*  CALCIUM 9.7 8.3*  MG  --  1.9  AST 27  --   ALT 18  --   ALKPHOS 90  --   BILITOT 0.6  --    ------------------------------------------------------------------------------------------------------------------ No results for input(s): CHOL, HDL, LDLCALC, TRIG, CHOLHDL, LDLDIRECT in the last 72 hours.  No results found for: HGBA1C ------------------------------------------------------------------------------------------------------------------ No results for input(s): TSH, T4TOTAL, T3FREE, THYROIDAB in the last 72 hours.  Invalid input(s): FREET3 ------------------------------------------------------------------------------------------------------------------ No results for input(s): VITAMINB12, FOLATE, FERRITIN, TIBC, IRON, RETICCTPCT in the last 72 hours.  Coagulation profile Recent Labs  Lab 12/07/17 1458  INR 1.03    No results for input(s): DDIMER in the last 72 hours.  Cardiac Enzymes No results for input(s): CKMB, TROPONINI, MYOGLOBIN in the last 168 hours.  Invalid input(s): CK ------------------------------------------------------------------------------------------------------------------ No results found for: BNP  Micro Results Recent Results (from the past 240 hour(s))  Culture, blood (Routine x 2)     Status: None (Preliminary result)   Collection Time: 12/07/17  2:56 PM  Result Value Ref Range Status   Specimen Description BLOOD RIGHT HAND  Final   Special Requests   Final    BOTTLES DRAWN  AEROBIC AND ANAEROBIC Blood Culture adequate volume   Culture   Final    NO GROWTH 2 DAYS Performed at [facility], [place] [address], [place], [place] [address]    Report Status PENDING  Incomplete  Culture, blood (Routine x 2)     Status: None (Preliminary result)   Collection Time: 12/07/17  3:07 PM  Result Value Ref Range Status   Specimen Description BLOOD LEFT HAND  Final   Special Requests   Final    BOTTLES DRAWN AEROBIC ONLY Blood Culture results may not be optimal due to an inadequate volume of blood received in culture bottles   Culture   Final    NO GROWTH 2 DAYS Performed at [facility], [place] [address], [place], [place] [address]    Report Status PENDING  Incomplete    Radiology Reports Dg Chest Portable 1 View  Result Date: 12/07/2017 CLINICAL DATA:  Evaluate central line placement EXAM: PORTABLE CHEST 1 VIEW COMPARISON:  12/07/2017 FINDINGS: Right IJ catheter tip projects over the mid SVC. No pneumothorax identified. Heart size normal. No  pleural effusion or edema. No airspace opacities. Diffuse gaseous distension of the bowel loops noted within the right abdomen. IMPRESSION: 1. Right IJ catheter tip projects over the mid SVC. 2. No pneumothorax. Electronically Signed   By: Kerby Moors M.D.   On: 12/07/2017 17:22   Dg Chest Portable 1 View  Result Date: 12/07/2017 CLINICAL DATA:  Fever and lethargy. EXAM: PORTABLE CHEST 1 VIEW COMPARISON:  07/07/2017, 03/03/2015 FINDINGS: Lungs are hypoinflated without focal airspace consolidation or effusion. Moderate air under the right hemidiaphragm likely within colon and similar to 03/03/2015. Cardiomediastinal silhouette and remainder of the exam is unchanged. IMPRESSION: No acute cardiopulmonary disease. Air under the right hemidiaphragm likely within prominent colonic loops and not significantly changed from 03/03/2015. Electronically Signed   By: Marin Olp M.D.   On: 12/07/2017 15:25   Ct Renal Stone  Study  Result Date: 12/08/2017 CLINICAL DATA:  Neurogenic bladder and sepsis likely originating from the urinary tract. EXAM: CT ABDOMEN AND PELVIS WITHOUT CONTRAST TECHNIQUE: Multidetector CT imaging of the abdomen and pelvis was performed following the standard protocol without IV contrast. COMPARISON:  CT abdomen and pelvis 03/03/2015. FINDINGS: Lower chest: No pleural or pericardial effusion. Dependent atelectasis noted. Hepatobiliary: Willette Alma type stones are seen layering dependently within the gallbladder. No evidence of cholecystitis. Liver and biliary tree are unremarkable. Pancreas: Unremarkable. No pancreatic ductal dilatation or surrounding inflammatory changes. Spleen: Normal in size without focal abnormality. Adrenals/Urinary Tract: Adrenal glands are unremarkable. Kidneys are normal, without renal calculi, focal lesion, or hydronephrosis. Suprapubic catheter is in place in a decompressed urinary bladder. Stomach/Bowel: Very large volume of stool is present the rectosigmoid colon. The colon is otherwise unremarkable. The appendix, stomach and small bowel appear normal. Vascular/Lymphatic: Aortic atherosclerosis. No enlarged abdominal or pelvic lymph nodes. Reproductive: Prostate is unremarkable. Other: None. Musculoskeletal: No acute bony abnormality is identified. 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 The appears to be a decubitus ulcer overlying the left ischium. IMPRESSION: No acute abnormality.  Negative for urinary tract stone. Gallstones without evidence of cholecystitis. Large volume of stool in the rectosigmoid colon. The patient appears to have a decubitus ulcer over the left ischium. Electronically Signed   By: Inge Rise M.D.   On: 12/08/2017 14:09    Time Spent in minutes  30   Lala Lund M.D on 12/09/2017 at 8:55 AM  To page go to [email or web address] - password [facility]

## 2017-12-09 NOTE — Consult Note (Signed)
Consultation Note Date: 12/09/2017   Patient Name: Terry Palmer  DOB: 01-24-1975  MRN: [id number]  Age / Sex: 42 y.o., male  PCP: Terry Jacobsen, MD Referring Physician: Thurnell Lose, MD  Reason for Consultation: Establishing goals of care  HPI/Patient Profile: 42 y.o. male  with past medical history of advanced multiple sclerosis, nonverbal at baseline, functional quadriplegic with contractures, dementia, suprapubic catheter, history of bladder stones and neurogenic bladder, PEG tube, history of seizure disorder, history of aspiration pneumonia in 2015, dysphasia, admitted on 12/07/2017 with sepsis due to UTI due to chronic indwelling suprapubic catheter, changed by urology 12/1.  Palliative medicine team consulted for goals of care.  Clinical Assessment and Goals of Care: Terry Palmer is lying quietly in bed.  He does not look in my direction or try to communicate with me in any meaningful way.  I asked him questions requiring yes or no responses, and give him time to blink once for yes.  He does not respond at this time.  There is no family at bedside at this time.  Conference with case management related to goals of care, home situation/services in place, disposition needs.  Call to wife, Terry Palmer.  She tells me that she saw him yesterday, had his mother on the telephone as they interacted.  She shares that he was not responding to her questions yesterday, but she considered this was because of his acute illness.  We talked about his response of yes is one blink, she tells me this is correct.  Terry Palmer shares that she is Terry Palmer legal wife, married 21 years.  They have 2 children ages 89 and 43.     Terry Palmer shares that her husband was first diagnosed at age 59.  He has been like this since 2012, but prior to then was able to talk.  We talk about his slow recovery and at least a few more days  in the hospital is expected.  We reviewed the treatment plan in detail.  Terry Palmer shares that she expects for him to return to their home upon discharge.  She shares that he was in SNF from 2012 to beginning of 2015, and during that time his MS progressed, and his health declined.    Contact with hospitalist related to goals of care discussion. PMT will continue to follow.  HCPOA NEXT OF KIN -wife, Terry Palmer.  Document section of epic chart shows DURABLE POWER OF ATTORNEY paperwork from 2013 naming Terry Palmer as durable power of attorney.  There is no healthcare power of attorney paperwork noted.  Terry Palmer tells me that she does not have guardianship through the court or healthcare power of attorney.  I share that she does not need paperwork for healthcare power of attorney as they are legally married, healthcare power of attorney, by law, is her right.  SUMMARY OF RECOMMENDATIONS   Continue to treat the treatable.  Continue CODE STATUS discussions after wife has had an opportunity to speak with Terry Palmer mother who is currently in  [place].  Code Status/Advance Care Planning:  Full code  We discuss code status, and concept of treat the treatable.  Terry Palmer tells me that she wouldn't want to have him on life support, connected to tubes and machines.   She wants to discuss CODE STATUS with his mother who is currently in [place], but returning to [place] now that Terry Palmer is hospitalized.  (He is her only child). We talk about treat the treatable, but allow a natural death.   Symptom Management:   Per hospitalist, no additional needs at this time.  Palliative Prophylaxis:   Aspiration, Palliative Wound Care and Turn Reposition  Additional Recommendations (Limitations, Scope, Preferences):  Full Scope Treatment  Psycho-social/Spiritual:   Desire for further Chaplaincy support:no  Additional Recommendations: Caregiving  Support/Resources and Education on Hospice  Prognosis:   Unable  to determine, based on outcomes.  Complicated by relatively young age, 26 to 78 months would not be surprising based on  functional quad status, recurrent infection, frailty, bedbound/contracted status.   Discharge Planning: To be determined, anticipate return to family home with services already in place.      Primary Diagnoses: Present on Admission: . Quadriparesis (muscle weakness) ([place]) . Multiple sclerosis ([place]) . FTT (failure to thrive) in adult . Neurogenic bladder . Sepsis secondary to UTI ([place]) . Protein-calorie malnutrition, severe ([place]) . 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 Sepsis ([place])   I have reviewed the medical record, interviewed the patient and family, and examined the patient. The following aspects are pertinent.  Past Medical History:  Diagnosis Date  . Aspiration pneumonia ([place]) 01/20/2013  . Bladder calculi   . Childhood asthma   . 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 Depression   . Dysphagia   . 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 MS (multiple sclerosis) ([place])   . Neurogenic bladder   . Neuromuscular disorder ([place])    Quadraperesis  . Normocytic anemia 05/28/2011  . Quadriparesis (muscle weakness) ([place]) 03/12/2011  . Quadriplegia and quadriparesis ([place]) 12/24/2014  . Recurrent upper respiratory infection (URI)   . Recurrent UTI   . Seizure disorder ([place])   . Shortness of breath    Social History   Socioeconomic History  . Marital status: Married    Spouse name: Not on file  . Number of children: 2  . Years of education: Not on file  . Highest education level: Not on file  Occupational History  . Not on file  Social Needs  . Financial resource strain: Not on file  . Food insecurity:    Worry: Not on file    Inability: Not on file  . Transportation needs:    Medical: Not on file    Non-medical: Not on file  Tobacco Use  . Smoking status: Former Smoker    Types:  Cigarettes    Last attempt to quit: 02/02/1999    Years since quitting: 18.8  . Smokeless tobacco: Former Network engineer and Sexual Activity  . Alcohol use: No  . Drug use: Not on file    Comment: Former use  . Sexual activity: Never  Lifestyle  . Physical activity:    Days per week: Not on file    Minutes per session: Not on file  . Stress: Not on file  Relationships  . Social connections:    Talks on phone: Not on file    Gets together: Not on file    Attends religious  service: Not on file    Active member of club or organization: Not on file    Attends meetings of clubs or organizations: Not on file    Relationship status: Not on file  Other Topics Concern  . Not on file  Social History Narrative   The patient currently lives at home with his wife and kids, who help him with his ADL's.  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  The patient currently has [facility], and is in the process of reapplying for Medicaid (12/2010).    Yuto Cajuste (cell) [phone]; (home) [phone],     Crispin Vogel (cell) [phone] (home) [phone]   Family History  Problem Relation Age of Onset  . Asthma Mother    Scheduled Meds: . baclofen  20 mg Per Tube QID  . buPROPion  75 mg Per Tube TID  . dantrolene  50 mg Per Tube QID  . enoxaparin (LOVENOX) injection  40 mg Subcutaneous Q24H  . [START ON 12/10/2017] feeding supplement (JEVITY 1.5 CAL/FIBER)  1,000 mL Per Tube Q24H  . free water  240 mL Per Tube Q4H  . ipratropium-albuterol  3 mL Nebulization BID  . levETIRAcetam  1,500 mg Per Tube BID  . mouth rinse  15 mL Mouth Rinse BID  . multivitamin  15 mL Oral Daily  . pantoprazole sodium  40 mg Per Tube Daily  . polyethylene glycol  17 g Per Tube QPM   Continuous Infusions: . cefTRIAXone (ROCEPHIN)  IV 1 g (12/09/17 1034)  . sodium chloride     PRN Meds:.acetaminophen, bisacodyl, diazepam,  HYDROcodone-acetaminophen, [DISCONTINUED] ondansetron **OR** ondansetron (ZOFRAN) IV, senna, sodium chloride flush, tiZANidine Medications Prior to Admission:  Prior to Admission medications   Medication Sig Start Date End Date Taking? Authorizing Provider  acetaminophen (TYLENOL) 500 MG tablet Take 1 tablet (500 mg total) by mouth every 8 (eight) hours as needed for fever (or pain). Patient taking differently: Take 500 mg by mouth 2 (two) times daily.  03/05/15  Yes Debbe Odea, MD  baclofen (LIORESAL) 20 MG tablet Place 1 tablet (20 mg total) into feeding tube 4 (four) times daily. 07/13/15  Yes Kathrynn Ducking, MD  bisacodyl (DULCOLAX) 10 MG suppository Place 10 mg rectally once as needed for moderate constipation.   Yes [provider]  buPROPion (WELLBUTRIN) 75 MG tablet Place 1 tablet (75 mg total) into feeding tube 3 (three) times daily. 01/24/13  Yes Bonnielee Haff, MD  Cobalamin Combinations (OPURITY B12/FOLIC ACID) 9983-382 MCG TABS Take 0.5 tablets by mouth daily.   Yes [provider]  dantrolene (DANTRIUM) 50 MG capsule Give 50 mg by tube 4 (four) times daily. At 0800, 1200, 1600, 2000   Yes [provider]  diazepam (VALIUM) 5 MG tablet Place 5 mg into feeding tube at bedtime as needed for anxiety (sleep).    Yes [provider]  fexofenadine (ALLEGRA) 180 MG tablet Place 180 mg into feeding tube daily.   Yes [provider]  guaifenesin (ROBITUSSIN) 100 MG/5ML syrup Take 200 mg by mouth 3 (three) times daily as needed for cough.   Yes [provider]  HYDROcodone-acetaminophen (NORCO) 7.5-325 MG per tablet Place 1 tablet into feeding tube every 6 (six) hours as needed (pain).    Yes [provider]  ipratropium-albuterol (DUONEB) 0.5-2.5 (3) MG/3ML SOLN Take 3 mLs by nebulization See admin instructions. Four times daily and as needed for  shortness of breath   Yes [provider]  levETIRAcetam (KEPPRA) 500 MG  tablet Take 3 tablets (1,500 mg total) by mouth 2 (two) times daily. 05/29/15  Yes Jule Ser, DO  Multiple Vitamin (MULTIVITAMIN WITH MINERALS) TABS tablet Place 1 tablet into feeding tube daily. 01/24/13  Yes Bonnielee Haff, MD  Nutritional Supplements (FEEDING SUPPLEMENT, JEVITY 1.5 CAL,) LIQD Place 1,000 mLs into feeding tube See admin instructions. 39mL/1hr over 12 hrs   Yes [provider]  omeprazole (PRILOSEC) 2 mg/mL SUSP Place 20 mLs (40 mg total) into feeding tube daily. 01/24/13  Yes Bonnielee Haff, MD  polyethylene glycol [facility] / GLYCOLAX) packet Place 17 g into feeding tube daily. Patient taking differently: Place 17 g into feeding tube every evening.  01/24/13  Yes Bonnielee Haff, MD  polyvinyl alcohol (LIQUIFILM TEARS) 1.4 % ophthalmic solution Place 1 drop into both eyes 3 (three) times daily. Patient taking differently: Place 1 drop into both eyes 2 (two) times daily.  01/24/13  Yes Bonnielee Haff, MD  senna (SENOKOT) 8.6 MG TABS tablet Take 2 tablets by mouth at bedtime.    Yes [provider]  sodium phosphate (FLEET) enema Place 1 enema rectally once as needed (for constipation). follow package directions   Yes [provider]  tiZANidine (ZANAFLEX) 2 MG tablet Take 1 tablet (2 mg total) by mouth every 6 (six) hours as needed for muscle spasms. Patient taking differently: Place 2 mg into feeding tube 3 (three) times daily.  11/25/15  Yes Kathrynn Ducking, MD  vitamin C (ASCORBIC ACID) 500 MG tablet Place 500 mg into feeding tube daily.    Yes [provider]  Water For Irrigation, Sterile (FREE WATER) SOLN Place 240 mLs into feeding tube every 4 (four) hours. 01/24/13  Yes Bonnielee Haff, MD   No Known Allergies Review of Systems  Unable to perform ROS: Patient nonverbal    Physical Exam  Constitutional:  Appears chronically/acutely ill, frail, contracted, does not respond in any meaningful way  HENT:  Head: Atraumatic.  Some  temporal wasting  Cardiovascular: Normal rate.  Pulmonary/Chest: Effort normal. No respiratory distress.  Abdominal: Soft. He exhibits no distension.  PEG tube  Musculoskeletal: He exhibits no edema.  Contracted  Neurological:  Eyes are open but known to be nonverbal.  He will slowly blink occasionally, but I cannot correlate that he understands my question  Skin: Skin is warm and dry.  Nursing note and vitals reviewed.   Vital Signs: BP 118/73   Pulse 88   Temp 99.5 F (37.5 C) (Axillary)   Resp 18   Ht 6\' 4"  (1.93 m)   Wt 79.3 kg   SpO2 98%   BMI 21.28 kg/m  Pain Scale: Faces   Pain Score: 0-No pain   SpO2: SpO2: 98 % O2 Device:SpO2: 98 % O2 Flow Rate: .O2 Flow Rate (L/min): 3 L/min  IO: Intake/output summary:   Intake/Output Summary (Last 24 hours) at 12/09/2017 1504 Last data filed at 12/09/2017 1106 Gross per 24 hour  Intake 2080 ml  Output 3750 ml  Net -1670 ml    LBM: Last BM Date: 12/09/17 Baseline Weight: Weight: 79.3 kg Most recent weight: Weight: 79.3 kg     Palliative Assessment/Data:   Flowsheet Rows     Most Recent Value  Intake Tab  Referral Department  Hospitalist  Unit at Time of Referral  Intermediate Care Unit  Palliative Care Primary Diagnosis  Neurology  Date Notified  12/07/17  Palliative Care  Type  Return patient Palliative Care  Reason for referral  Clarify Goals of Care  Date of Admission  12/07/17  Date first seen by Palliative Care  12/09/17  # of days Palliative referral response time  2 Day(s)  # of days IP prior to Palliative referral  0  Clinical Assessment  Palliative Performance Scale Score  10%  Pain Max last 24 hours  Not able to report  Pain Min Last 24 hours  Not able to report  Dyspnea Max Last 24 Hours  Not able to report  Dyspnea Min Last 24 hours  Not able to report  Psychosocial & Spiritual Assessment  Palliative Care Outcomes  Patient/Family meeting held?  Yes  Palliative Care Outcomes  Clarified goals of  care      Time In: 1440 Time Out: 1550 Time Total: 70 minutes Greater than 50%  of this time was spent counseling and coordinating care related to the above assessment and plan.  Signed by: Drue Novel, NP   Please contact Palliative Medicine Team phone at [phone] for questions and concerns.  For individual provider: See Shea Evans

## 2017-12-09 NOTE — Progress Notes (Signed)
Initial Nutrition Assessment  DOCUMENTATION CODES:   Not applicable  INTERVENTION:    Jevity 1.5 @ 45 ml/hr (1080 ml) via PEG   Free water of 240 ml q4 hours   Provide liquid MVI daily  Provides: 1620 kcals, 69 grams protein, 821 ml free water (2261 ml with flushes).   If volume becomes an issue would recommend decreasing flush amount as this exceeds his needs. Recommend checking phosphorus.   NUTRITION DIAGNOSIS:   Inadequate oral intake related to inability to eat as evidenced by NPO status.  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 Presents this admission with sepsis from UTI.    No family at bedside to provide nutrition history. Upon assessment RD observed Jevity 1.2 being infused at 42 ml/hr despite order being in for Jevity 1.5 @ 42 ml. Jevity 1.5 at 42 ml/hr provides 1512 kcal, 64 grams protein, and 760 ml free water.  Will adjust to Jevity 1.5 at 45 ml/hr to better meet needs. Will attempt to speak with family if possible.   Admission wt looks to be stated as last reading in July is exactly the same. Would recommend obtaining actual wt to monitor trends.   Nutrition-focused physical exam not completed; muscle depletion expected with chronic musculoskeletal disease.  Medications reviewed and include: miralax Labs reviewed: K 2.9 (L)   Diet Order:   Diet Order            Diet NPO time specified  Diet effective now              EDUCATION NEEDS:   Not appropriate for education at this time  Skin:  Skin Assessment: Reviewed RN Assessment(Per WOC, pressure injuries have healed completely)  Last BM:   12/09/17  Height:   Ht Readings from Last 1 Encounters:  12/07/17 6\' 4"  (1.93 m)    Weight:   Wt Readings from Last 1 Encounters:  12/07/17 79.3 kg    Ideal Body Weight:  82 kg(adjusted for quadriparesis)  BMI:  Body mass index is 21.28 kg/m.  Estimated Nutritional Needs:   Kcal:  1600-1800 kcal  Protein:  80-95 grams   Fluid:  >/= 1.6  L/day    Mariana Single RD, LDN Clinical Nutrition Pager # - [phone]

## 2017-12-10 LAB — BASIC METABOLIC PANEL
Anion gap: 10 (ref 5–15)
BUN: 6 mg/dL (ref 6–20)
CO2: 24 mmol/L (ref 22–32)
Calcium: 8.6 mg/dL — ABNORMAL LOW (ref 8.9–10.3)
Chloride: 104 mmol/L (ref 98–111)
Creatinine, Ser: 0.42 mg/dL — ABNORMAL LOW (ref 0.61–1.24)
GFR calc Af Amer: >60 mL/min (ref >60)
GLUCOSE: 114 mg/dL — AB (ref 70–99)
Potassium: 3.1 mmol/L — ABNORMAL LOW (ref 3.5–5.1)
Sodium: 138 mmol/L (ref 135–145)

## 2017-12-10 LAB — GLUCOSE, CAPILLARY
GLUCOSE-CAPILLARY: 111 mg/dL — AB (ref 70–99)
GLUCOSE-CAPILLARY: 115 mg/dL — AB (ref 70–99)
Glucose-Capillary: 105 mg/dL — ABNORMAL HIGH (ref 70–99)
Glucose-Capillary: 113 mg/dL — ABNORMAL HIGH (ref 70–99)
Glucose-Capillary: 118 mg/dL — ABNORMAL HIGH (ref 70–99)
Glucose-Capillary: 97 mg/dL (ref 70–99)

## 2017-12-10 LAB — LACTIC ACID, PLASMA: Lactic Acid, Venous: 1.3 mmol/L (ref 0.5–1.9)

## 2017-12-10 MED ORDER — CEPHALEXIN 500 MG PO CAPS: 500.0000 mg | ORAL_CAPSULE | Freq: Three times a day (TID) | ORAL | 0 refills | Status: AC

## 2017-12-10 MED ORDER — POTASSIUM CHLORIDE 10 MEQ/100ML IV SOLN
10.0000 meq | INTRAVENOUS | Status: AC
  Administered 2017-12-10 (×4): 10 meq via INTRAVENOUS
  Filled 2017-12-10 (×4): qty 100

## 2017-12-10 MED ORDER — CEPHALEXIN 500 MG PO CAPS: 500.0000 mg | ORAL_CAPSULE | Freq: Three times a day (TID) | ORAL | 0 refills | Status: DC

## 2017-12-10 MED ORDER — CHLORHEXIDINE GLUCONATE 0.12 % MT SOLN
15.0000 mL | Freq: Two times a day (BID) | OROMUCOSAL | Status: DC
  Administered 2017-12-10 (×2): 15 mL via OROMUCOSAL
  Filled 2017-12-10 (×2): qty 15

## 2017-12-10 MED ORDER — IPRATROPIUM-ALBUTEROL 0.5-2.5 (3) MG/3ML IN SOLN: 3.0000 mL | RESPIRATORY_TRACT | Status: DC | PRN

## 2017-12-10 MED ORDER — SODIUM CHLORIDE 0.9 % IV BOLUS
500.0000 mL | Freq: Once | INTRAVENOUS | Status: AC
  Administered 2017-12-10: 500 mL via INTRAVENOUS

## 2017-12-10 MED ORDER — POTASSIUM CHLORIDE 20 MEQ/15ML (10%) PO SOLN
40.0000 meq | Freq: Once | ORAL | Status: AC
  Administered 2017-12-10: 40 meq
  Filled 2017-12-10: qty 30

## 2017-12-10 NOTE — Discharge Instructions (Signed)
Follow with Primary MD [name], MD in 7 days   Get CBC, CMP, 2 view Chest X ray -  checked  by Primary MD  in 5-7 days  Activity: As tolerated with Full fall precautions use walker/cane & assistance as needed  Disposition Home   Diet: Diet and medications via PEG tube as before.   Special Instructions: If you have smoked or chewed Tobacco  in the last 2 yrs please stop smoking, stop any regular Alcohol  and or any Recreational drug use.  On your next visit with your primary care physician please Get Medicines reviewed and adjusted.  Please request your Prim.MD to go over all Hospital Tests and Procedure/Radiological results at the follow up, please get all Hospital records sent to your Prim MD by signing hospital release before you go home.  If you experience worsening of your admission symptoms, develop shortness of breath, life threatening emergency, suicidal or homicidal thoughts you must seek medical attention immediately by calling 911 or calling your MD immediately  if symptoms less severe.  You Must read complete instructions/literature along with all the possible adverse reactions/side effects for all the Medicines you take and that have been prescribed to you. Take any new Medicines after you have completely understood and accpet all the possible adverse reactions/side effects.

## 2017-12-10 NOTE — Progress Notes (Signed)
NCM called and arranged pt's transportation to home with [facility]. NCM made wife and nurse aware. Whitman Hero RN,BSN,CM

## 2017-12-10 NOTE — Progress Notes (Signed)
Patient being discharged home with home health. Discharge instructions and prescriptions given to EMS. Central Line was removed earlier today.  Tube feeds stopped and PEG tube flushed and clamped.  Patient being transported home by EMS.

## 2017-12-10 NOTE — Discharge Summary (Signed)
Terry Palmer TMY:[id number] DOB: 26-Feb-1975 DOA: 12/07/2017  PCP: [name], MD  Admit date: 12/07/2017  Discharge date: 12/10/2017  Admitted From: Home   Disposition:  Home ( refused placement by wife)   Recommendations for Outpatient Follow-up:   Follow up with PCP in 1-2 weeks  PCP Please obtain BMP/CBC, 2 view CXR in 1week,  (see Discharge instructions)   PCP Please follow up on the following pending results:    Home Health: PT,RN, S work   Equipment/Devices:   Consultations: Pall.Care Discharge Condition: Guarded   CODE STATUS: Full   Diet Recommendation: All diet and medications via PEG tube as before.  N.p.o. by mouth.     Chief Complaint  Patient presents with  . Fever  . Fatigue     Brief history of present illness from the day of admission and additional interim summary    Terry Palmer a 42 y.o.malewith medical historyofadvanced multiple sclerosis, nonverbal at baseline, quadriplegic with contractures, dementia, suprapubic catheter, PEG tube, history of seizures brought to the emergency room by his family due to worsening lethargy x1 day.  He had recently finished a 10-day course of antibiotics for UTI, this time he was admitted for sepsis with likely source again being urine.                                                                 Hospital Course   1.  Sepsis due to UTI due to chronic indwelling Foley catheter present on admission.  Supportive care which included IV antibiotics and IV fluids sepsis pathophysiology has resolved, suprapubic catheter was changed by urologist Dr. Jeffie Pollock on 12/08/2017.  Clinically stable will transition to IV Rocephin.    He likely is close to baseline and will be transition to 5 more days of Keflex via PEG tube for complex UTI with sepsis  on 12/10/2017.  Long-term prognosis again remains very guarded.  CT stone protocol unremarkable.  2.  Advanced underlying MS causing functional quadriplegia, dysphagia with chronic indwelling PEG, neurogenic bladder with chronic suprapubic catheter.  Supportive care.  We had palliative care evaluate the patient, he has been evaluated by palliative care before as well, however despite his extremely poor condition and quality of life wife wants aggressive measures to be taken if necessary and wants him to be full code.  3.  History of seizures.  On Keppra continue..  4.  Toxic metabolic encephalopathy in a patient with underlying advanced dementia as well.  Baseline nonverbal but communicates with family with blinking of eyes, treat underlying cause as a #1 and monitor.  5.  Severe protein calorie malnutrition.  Tube feeds via PEG tube continued.  6.  Steroid-induced adrenal insufficiency.  Will this admission did not require stress dose steroids.     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[place])   Sepsis ([place])   Steroid-induced adrenal suppression ([place])   Palliative care encounter   Goals of care, counseling/discussion   Palliative care by specialist   DNR (do not resuscitate) discussion    Discharge instructions    Discharge Instructions    Discharge instructions   Complete by:  As directed    Follow with Primary MD [name], MD in 7 days   Get CBC, CMP, 2 view Chest X ray -  checked  by Primary MD  in 5-7 days  Activity: As tolerated with Full fall precautions use walker/cane & assistance as needed  Disposition Home   Diet: Diet and medications via PEG tube as before.   Special Instructions: If you  have smoked or chewed Tobacco  in the last 2 yrs please stop smoking, stop any regular Alcohol  and or any Recreational drug use.  On your next visit with your primary care physician please Get Medicines reviewed and adjusted.  Please request your Prim.MD to go over all Hospital Tests and Procedure/Radiological results at the follow up, please get all Hospital records sent to your Prim MD by signing hospital release before you go home.  If you experience worsening of your admission symptoms, develop shortness of breath, life threatening emergency, suicidal or homicidal thoughts you must seek medical attention immediately by calling 911 or calling your MD immediately  if symptoms less severe.  You Must read complete instructions/literature along with all the possible adverse reactions/side effects for all the Medicines you take and that have been prescribed to you. Take any new Medicines after you have completely understood and accpet all the possible adverse reactions/side effects.   Increase activity slowly   Complete by:  As directed       Discharge Medications   Allergies as of 12/10/2017   No Known Allergies     Medication List    TAKE these medications   acetaminophen 500 MG tablet Commonly known as:  TYLENOL Take 1 tablet (500 mg total) by mouth every 8 (eight) hours as needed for fever (or pain). What changed:  when to take this   baclofen 20 MG tablet Commonly known as:  LIORESAL Place 1 tablet (20 mg total) into feeding tube 4 (four) times daily.   bisacodyl 10 MG suppository Commonly known as:  DULCOLAX Place 10 mg rectally once as needed for moderate constipation.   buPROPion 75 MG tablet Commonly known as:  WELLBUTRIN Place 1 tablet (75 mg total) into feeding tube 3 (three) times daily.   cephALEXin 500 MG capsule Commonly known as:  KEFLEX Place 1 capsule (500 mg total) into feeding tube 3 (three) times daily for 4 days.   dantrolene 50 MG capsule Commonly  known as:  DANTRIUM Give 50 mg by tube 4 (four) times daily. At 0800, 1200, 1600, 2000   diazepam 5 MG tablet Commonly known as:  VALIUM Place 5 mg into feeding tube at bedtime as needed for anxiety (sleep).   feeding supplement (JEVITY 1.5 CAL) Liqd Place 1,000 mLs into feeding tube See admin instructions. 52mL/1hr over 12 hrs   fexofenadine 180 MG tablet Commonly known as:  ALLEGRA Place 180 mg into  feeding tube daily.   free water Soln Place 240 mLs into feeding tube every 4 (four) hours.   guaifenesin 100 MG/5ML syrup Commonly known as:  ROBITUSSIN Take 200 mg by mouth 3 (three) times daily as needed for cough.   HYDROcodone-acetaminophen 7.5-325 MG tablet Commonly known as:  [place] 1 tablet into feeding tube every 6 (six) hours as needed (pain).   ipratropium-albuterol 0.5-2.5 (3) MG/3ML Soln Commonly known as:  DUONEB Take 3 mLs by nebulization See admin instructions. Four times daily and as needed for shortness of breath   levETIRAcetam 500 MG tablet Commonly known as:  KEPPRA Take 3 tablets (1,500 mg total) by mouth 2 (two) times daily.   multivitamin with minerals Tabs tablet Place 1 tablet into feeding tube daily.   omeprazole 2 mg/mL Susp Commonly known as:  PRILOSEC Place 20 mLs (40 mg total) into feeding tube daily.   OPURITY B12/FOLIC ACID 1031-594 MCG Tabs Take 0.5 tablets by mouth daily.   polyethylene glycol packet Commonly known as:  MIRALAX / GLYCOLAX Place 17 g into feeding tube daily. What changed:  when to take this   polyvinyl alcohol 1.4 % ophthalmic solution Commonly known as:  LIQUIFILM TEARS Place 1 drop into both eyes 3 (three) times daily. What changed:  when to take this   senna 8.6 MG Tabs tablet Commonly known as:  SENOKOT Take 2 tablets by mouth at bedtime.   sodium phosphate enema Place 1 enema rectally once as needed (for constipation). follow package directions   tiZANidine 2 MG tablet Commonly known as:   ZANAFLEX Take 1 tablet (2 mg total) by mouth every 6 (six) hours as needed for muscle spasms. What changed:    how to take this  when to take this   vitamin C 500 MG tablet Commonly known as:  ASCORBIC ACID Place 500 mg into feeding tube daily.       Follow-up Information    [name], MD. Schedule an appointment as soon as possible for a visit in 1 week(s).   Specialty:  Family Medicine Contact information: [place] [address] [phone]           Major procedures and Radiology Reports - PLEASE review detailed and final reports thoroughly  -        Dg Chest Portable 1 View  Result Date: 12/07/2017 CLINICAL DATA:  Evaluate central line placement EXAM: PORTABLE CHEST 1 VIEW COMPARISON:  12/07/2017 FINDINGS: Right IJ catheter tip projects over the mid SVC. No pneumothorax identified. Heart size normal. No pleural effusion or edema. No airspace opacities. Diffuse gaseous distension of the bowel loops noted within the right abdomen. IMPRESSION: 1. Right IJ catheter tip projects over the mid SVC. 2. No pneumothorax. Electronically Signed   By: Kerby Moors M.D.   On: 12/07/2017 17:22   Dg Chest Portable 1 View  Result Date: 12/07/2017 CLINICAL DATA:  Fever and lethargy. EXAM: PORTABLE CHEST 1 VIEW COMPARISON:  07/07/2017, 03/03/2015 FINDINGS: Lungs are hypoinflated without focal airspace consolidation or effusion. Moderate air under the right hemidiaphragm likely within colon and similar to 03/03/2015. Cardiomediastinal silhouette and remainder of the exam is unchanged. IMPRESSION: No acute cardiopulmonary disease. Air under the right hemidiaphragm likely within prominent colonic loops and not significantly changed from 03/03/2015. Electronically Signed   By: Marin Olp M.D.   On: 12/07/2017 15:25   Ct Renal Stone Study  Result Date: 12/08/2017 CLINICAL DATA:  Neurogenic bladder and sepsis likely originating  from the urinary tract. EXAM: CT ABDOMEN  AND PELVIS WITHOUT CONTRAST TECHNIQUE: Multidetector CT imaging of the abdomen and pelvis was performed following the standard protocol without IV contrast. COMPARISON:  CT abdomen and pelvis 03/03/2015. FINDINGS: Lower chest: No pleural or pericardial effusion. Dependent atelectasis noted. Hepatobiliary: Willette [place] type stones are seen layering dependently within the gallbladder. No evidence of cholecystitis. Liver and biliary tree are unremarkable. Pancreas: Unremarkable. No pancreatic ductal dilatation or surrounding inflammatory changes. Spleen: Normal in size without focal abnormality. Adrenals/Urinary Tract: Adrenal glands are unremarkable. Kidneys are normal, without renal calculi, focal lesion, or hydronephrosis. Suprapubic catheter is in place in a decompressed urinary bladder. Stomach/Bowel: Very large volume of stool is present the rectosigmoid colon. The colon is otherwise unremarkable. The appendix, stomach and small bowel appear normal. Vascular/Lymphatic: Aortic atherosclerosis. No enlarged abdominal or pelvic lymph nodes. Reproductive: Prostate is unremarkable. Other: None. Musculoskeletal: No acute bony abnormality is identified. 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 The appears to be a decubitus ulcer overlying the left ischium. IMPRESSION: No acute abnormality.  Negative for urinary tract stone. Gallstones without evidence of cholecystitis. Large volume of stool in the rectosigmoid colon. The patient appears to have a decubitus ulcer over the left ischium. Electronically Signed   By: Inge Rise M.D.   On: 12/08/2017 14:09    Micro Results    Recent Results (from the past 240 hour(s))  Culture, blood (Routine x 2)     Status: None (Preliminary result)   Collection Time: 12/07/17  2:56 PM  Result Value Ref Range Status   Specimen Description BLOOD RIGHT HAND  Final   Special Requests   Final    BOTTLES DRAWN AEROBIC AND ANAEROBIC Blood Culture adequate volume   Culture   Final     NO GROWTH 2 DAYS Performed at [facility], [place] [address], [place], [place] [address]    Report Status PENDING  Incomplete  Culture, blood (Routine x 2)     Status: None (Preliminary result)   Collection Time: 12/07/17  3:07 PM  Result Value Ref Range Status   Specimen Description BLOOD LEFT HAND  Final   Special Requests   Final    BOTTLES DRAWN AEROBIC ONLY Blood Culture results may not be optimal due to an inadequate volume of blood received in culture bottles   Culture   Final    NO GROWTH 2 DAYS Performed at [facility], [place] [address], [place], [place] [address]    Report Status PENDING  Incomplete  MRSA PCR Screening     Status: None   Collection Time: 12/09/17  8:09 PM  Result Value Ref Range Status   MRSA by PCR NEGATIVE NEGATIVE Final    Comment:        The GeneXpert MRSA Assay (FDA approved for NASAL specimens only), is one component of a comprehensive MRSA colonization surveillance program. It is not intended to diagnose MRSA infection nor to guide or monitor treatment for MRSA infections. Performed at [facility], [place] [address], [place], [place] [address]     Today   Subjective    Terry Palmer today in bed, nonverbal at baseline.   Objective   Blood pressure 108/88, pulse 89, temperature 99 F (37.2 C), temperature source Axillary, resp. rate 14, height 6\' 4"  (1.93 m), weight 78.1 kg, SpO2 94 %.   Intake/Output Summary (Last 24 hours) at 12/10/2017 0940 Last data filed at 12/10/2017 0751 Gross per 24 hour  Intake 2175.67  ml  Output 4075 ml  Net -1899.33 ml    Exam  Awake but non verbal, severe contractures leaning towards the right side, right IJ line in place, suprapubic catheter in place, PEG tube in place, functional quadriplegia [place].AT,PERRAL Supple Neck,No JVD, No cervical lymphadenopathy appriciated.  Symmetrical Chest wall movement, Good air movement bilaterally, CTAB RRR,No Gallops, Rubs or new Murmurs, No  Parasternal Heave +ve B.Sounds, Abd Soft, No tenderness, No organomegaly appriciated, No rebound - guarding or rigidity. No Cyanosis, Clubbing or edema, No new Rash or bruise   Data Review   CBC w Diff:  Lab Results  Component Value Date   WBC 14.2 (H) 12/09/2017   HGB 11.9 (L) 12/09/2017   HCT 37.0 (L) 12/09/2017   PLT 216 12/09/2017   LYMPHOPCT 4 12/07/2017   MONOPCT 2 12/07/2017   EOSPCT 0 12/07/2017   BASOPCT 0 12/07/2017    CMP:  Lab Results  Component Value Date   NA 138 12/10/2017   K 3.1 (L) 12/10/2017   CL 104 12/10/2017   CO2 24 12/10/2017   BUN 6 12/10/2017   CREATININE 0.42 (L) 12/10/2017   PROT 8.6 (H) 12/07/2017   ALBUMIN 4.4 12/07/2017   BILITOT 0.6 12/07/2017   ALKPHOS 90 12/07/2017   AST 27 12/07/2017   ALT 18 12/07/2017  .   Total Time in preparing paper work, data evaluation and todays exam - 63 minutes  Lala Lund M.D on 12/10/2017 at 9:40 AM  [facility]   Office  [phone]

## 2017-12-10 NOTE — Progress Notes (Signed)
Spoke with patient's wife about patient being discharged and going home on PO antibiotics.

## 2017-12-12 LAB — CULTURE, BLOOD (ROUTINE X 2)
CULTURE: NO GROWTH
Culture: NO GROWTH
Special Requests: ADEQUATE

## 2020-05-18 ENCOUNTER — Other Ambulatory Visit: Payer: Self-pay

## 2020-05-18 ENCOUNTER — Inpatient Hospital Stay (HOSPITAL_COMMUNITY)
Admission: EM | Admit: 2020-05-18 | Discharge: 2020-06-05 | DRG: 871 | Disposition: A | Discharge status: Home Health | Payer: Medicare HMO | Attending: Family Medicine | Admitting: Family Medicine

## 2020-05-18 ENCOUNTER — Encounter (HOSPITAL_COMMUNITY): Payer: Self-pay

## 2020-05-18 ENCOUNTER — Emergency Department (HOSPITAL_COMMUNITY): Payer: Medicare HMO

## 2020-05-18 DIAGNOSIS — Z515 Encounter for palliative care: Secondary | ICD-10-CM | POA: Diagnosis not present

## 2020-05-18 DIAGNOSIS — E1152 Type 2 diabetes mellitus with diabetic peripheral angiopathy with gangrene: Secondary | ICD-10-CM | POA: Diagnosis present

## 2020-05-18 DIAGNOSIS — J690 Pneumonitis due to inhalation of food and vomit: Secondary | ICD-10-CM | POA: Diagnosis present

## 2020-05-18 DIAGNOSIS — Z7401 Bed confinement status: Secondary | ICD-10-CM

## 2020-05-18 DIAGNOSIS — G40909 Epilepsy, unspecified, not intractable, without status epilepticus: Secondary | ICD-10-CM | POA: Diagnosis present

## 2020-05-18 DIAGNOSIS — Z87891 Personal history of nicotine dependence: Secondary | ICD-10-CM

## 2020-05-18 DIAGNOSIS — A419 Sepsis, unspecified organism: Secondary | ICD-10-CM | POA: Diagnosis not present

## 2020-05-18 DIAGNOSIS — Z20822 Contact with and (suspected) exposure to covid-19: Secondary | ICD-10-CM | POA: Diagnosis present

## 2020-05-18 DIAGNOSIS — R1310 Dysphagia, unspecified: Secondary | ICD-10-CM | POA: Diagnosis present

## 2020-05-18 DIAGNOSIS — M869 Osteomyelitis, unspecified: Secondary | ICD-10-CM

## 2020-05-18 DIAGNOSIS — N319 Neuromuscular dysfunction of bladder, unspecified: Secondary | ICD-10-CM | POA: Diagnosis present

## 2020-05-18 DIAGNOSIS — Z931 Gastrostomy status: Secondary | ICD-10-CM | POA: Diagnosis not present

## 2020-05-18 DIAGNOSIS — E1165 Type 2 diabetes mellitus with hyperglycemia: Secondary | ICD-10-CM | POA: Diagnosis present

## 2020-05-18 DIAGNOSIS — Z9359 Other cystostomy status: Secondary | ICD-10-CM

## 2020-05-18 DIAGNOSIS — E43 Unspecified severe protein-calorie malnutrition: Secondary | ICD-10-CM | POA: Diagnosis present

## 2020-05-18 DIAGNOSIS — I96 Gangrene, not elsewhere classified: Secondary | ICD-10-CM | POA: Diagnosis present

## 2020-05-18 DIAGNOSIS — L89154 Pressure ulcer of sacral region, stage 4: Secondary | ICD-10-CM | POA: Diagnosis present

## 2020-05-18 DIAGNOSIS — G35 Multiple sclerosis: Secondary | ICD-10-CM | POA: Diagnosis present

## 2020-05-18 DIAGNOSIS — E876 Hypokalemia: Secondary | ICD-10-CM | POA: Diagnosis present

## 2020-05-18 DIAGNOSIS — M4628 Osteomyelitis of vertebra, sacral and sacrococcygeal region: Secondary | ICD-10-CM | POA: Diagnosis present

## 2020-05-18 DIAGNOSIS — N390 Urinary tract infection, site not specified: Secondary | ICD-10-CM | POA: Diagnosis present

## 2020-05-18 DIAGNOSIS — Z7189 Other specified counseling: Secondary | ICD-10-CM | POA: Diagnosis not present

## 2020-05-18 DIAGNOSIS — E1169 Type 2 diabetes mellitus with other specified complication: Secondary | ICD-10-CM | POA: Diagnosis present

## 2020-05-18 DIAGNOSIS — F0280 Dementia in other diseases classified elsewhere without behavioral disturbance: Secondary | ICD-10-CM | POA: Diagnosis present

## 2020-05-18 DIAGNOSIS — I10 Essential (primary) hypertension: Secondary | ICD-10-CM | POA: Diagnosis present

## 2020-05-18 DIAGNOSIS — J189 Pneumonia, unspecified organism: Secondary | ICD-10-CM

## 2020-05-18 DIAGNOSIS — L89209 Pressure ulcer of unspecified hip, unspecified stage: Secondary | ICD-10-CM | POA: Insufficient documentation

## 2020-05-18 DIAGNOSIS — I472 Ventricular tachycardia: Secondary | ICD-10-CM | POA: Diagnosis not present

## 2020-05-18 DIAGNOSIS — Z794 Long term (current) use of insulin: Secondary | ICD-10-CM | POA: Diagnosis not present

## 2020-05-18 DIAGNOSIS — R4182 Altered mental status, unspecified: Secondary | ICD-10-CM | POA: Diagnosis not present

## 2020-05-18 DIAGNOSIS — K9423 Gastrostomy malfunction: Secondary | ICD-10-CM

## 2020-05-18 DIAGNOSIS — R532 Functional quadriplegia: Secondary | ICD-10-CM | POA: Diagnosis present

## 2020-05-18 DIAGNOSIS — L8990 Pressure ulcer of unspecified site, unspecified stage: Secondary | ICD-10-CM | POA: Insufficient documentation

## 2020-05-18 DIAGNOSIS — R0902 Hypoxemia: Secondary | ICD-10-CM

## 2020-05-18 DIAGNOSIS — Z79899 Other long term (current) drug therapy: Secondary | ICD-10-CM

## 2020-05-18 LAB — URINALYSIS, ROUTINE W REFLEX MICROSCOPIC
Bilirubin Urine: NEGATIVE
Glucose, UA: NEGATIVE mg/dL
Hgb urine dipstick: NEGATIVE
Ketones, ur: NEGATIVE mg/dL
Nitrite: NEGATIVE
Protein, ur: 30 mg/dL — AB
Specific Gravity, Urine: 1.009 (ref 1.005–1.030)
WBC, UA: >50 WBC/hpf — ABNORMAL HIGH (ref 0–5)
pH: 7 (ref 5.0–8.0)

## 2020-05-18 LAB — CBC WITH DIFFERENTIAL/PLATELET
Abs Immature Granulocytes: 0.16 10*3/uL — ABNORMAL HIGH (ref 0.00–0.07)
Basophils Absolute: 0.1 10*3/uL (ref 0.0–0.1)
Basophils Relative: 0 %
Eosinophils Absolute: 0.3 10*3/uL (ref 0.0–0.5)
Eosinophils Relative: 1 %
HCT: 37.9 % — ABNORMAL LOW (ref 39.0–52.0)
Hemoglobin: 12.1 g/dL — ABNORMAL LOW (ref 13.0–17.0)
Immature Granulocytes: 1 %
Lymphocytes Relative: 13 %
Lymphs Abs: 2.5 10*3/uL (ref 0.7–4.0)
MCH: 29.1 pg (ref 26.0–34.0)
MCHC: 31.9 g/dL (ref 30.0–36.0)
MCV: 91.1 fL (ref 80.0–100.0)
Monocytes Absolute: 1.4 10*3/uL — ABNORMAL HIGH (ref 0.1–1.0)
Monocytes Relative: 7 %
Neutro Abs: 15 10*3/uL — ABNORMAL HIGH (ref 1.7–7.7)
Neutrophils Relative %: 78 %
Platelets: 549 10*3/uL — ABNORMAL HIGH (ref 150–400)
RBC: 4.16 MIL/uL — ABNORMAL LOW (ref 4.22–5.81)
RDW: 12.8 % (ref 11.5–15.5)
WBC: 19.3 10*3/uL — ABNORMAL HIGH (ref 4.0–10.5)
nRBC: 0 % (ref 0.0–0.2)

## 2020-05-18 LAB — RESP PANEL BY RT-PCR (FLU A&B, COVID) ARPGX2
Influenza A by PCR: NEGATIVE
Influenza B by PCR: NEGATIVE
SARS Coronavirus 2 by RT PCR: NEGATIVE

## 2020-05-18 LAB — COMPREHENSIVE METABOLIC PANEL
ALT: 25 U/L (ref 0–44)
AST: 26 U/L (ref 15–41)
Albumin: 2.7 g/dL — ABNORMAL LOW (ref 3.5–5.0)
Alkaline Phosphatase: 107 U/L (ref 38–126)
Anion gap: 10 (ref 5–15)
BUN: 9 mg/dL (ref 6–20)
CO2: 23 mmol/L (ref 22–32)
Calcium: 9 mg/dL (ref 8.9–10.3)
Chloride: 100 mmol/L (ref 98–111)
Creatinine, Ser: 0.4 mg/dL — ABNORMAL LOW (ref 0.61–1.24)
GFR, Estimated: >60 mL/min (ref >60)
Glucose, Bld: 218 mg/dL — ABNORMAL HIGH (ref 70–99)
Potassium: 4.1 mmol/L (ref 3.5–5.1)
Sodium: 133 mmol/L — ABNORMAL LOW (ref 135–145)
Total Bilirubin: 0.2 mg/dL — ABNORMAL LOW (ref 0.3–1.2)
Total Protein: 7.6 g/dL (ref 6.5–8.1)

## 2020-05-18 LAB — I-STAT CHEM 8, ED
BUN: 9 mg/dL (ref 6–20)
Calcium, Ion: 1.1 mmol/L — ABNORMAL LOW (ref 1.15–1.40)
Chloride: 100 mmol/L (ref 98–111)
Creatinine, Ser: 0.3 mg/dL — ABNORMAL LOW (ref 0.61–1.24)
Glucose, Bld: 222 mg/dL — ABNORMAL HIGH (ref 70–99)
HCT: 38 % — ABNORMAL LOW (ref 39.0–52.0)
Hemoglobin: 12.9 g/dL — ABNORMAL LOW (ref 13.0–17.0)
Potassium: 4.1 mmol/L (ref 3.5–5.1)
Sodium: 135 mmol/L (ref 135–145)
TCO2: 24 mmol/L (ref 22–32)

## 2020-05-18 LAB — LACTIC ACID, PLASMA
Lactic Acid, Venous: 1.4 mmol/L (ref 0.5–1.9)
Lactic Acid, Venous: 1.3 mmol/L (ref 0.5–1.9)

## 2020-05-18 LAB — PROTIME-INR
INR: 1.1 (ref 0.8–1.2)
Prothrombin Time: 14.2 seconds (ref 11.4–15.2)

## 2020-05-18 LAB — LIPASE, BLOOD: Lipase: 28 U/L (ref 11–51)

## 2020-05-18 LAB — APTT: aPTT: 41 seconds — ABNORMAL HIGH (ref 24–36)

## 2020-05-18 LAB — CBG MONITORING, ED: Glucose-Capillary: 218 mg/dL — ABNORMAL HIGH (ref 70–99)

## 2020-05-18 MED ORDER — SODIUM CHLORIDE 0.9 % IV SOLN
500.0000 mg | INTRAVENOUS | Status: DC
  Administered 2020-05-19: 500 mg via INTRAVENOUS
  Filled 2020-05-18 (×2): qty 500

## 2020-05-18 MED ORDER — ACETAMINOPHEN 325 MG PO TABS
650.0000 mg | ORAL_TABLET | Freq: Four times a day (QID) | ORAL | Status: DC | PRN
  Administered 2020-05-20 – 2020-05-21 (×3): 650 mg via ORAL
  Filled 2020-05-18 (×3): qty 2

## 2020-05-18 MED ORDER — SODIUM CHLORIDE 0.9 % IV SOLN
2.0000 g | Freq: Three times a day (TID) | INTRAVENOUS | Status: DC
  Administered 2020-05-19 (×2): 2 g via INTRAVENOUS
  Filled 2020-05-18 (×2): qty 2

## 2020-05-18 MED ORDER — POLYETHYLENE GLYCOL 3350 17 G PO PACK: 17.0000 g | PACK | Freq: Every evening | ORAL | Status: DC

## 2020-05-18 MED ORDER — BUPROPION HCL 75 MG PO TABS
75.0000 mg | ORAL_TABLET | Freq: Three times a day (TID) | ORAL | Status: DC
  Administered 2020-05-18 – 2020-06-05 (×53): 75 mg
  Filled 2020-05-18 (×60): qty 1

## 2020-05-18 MED ORDER — OPURITY B12/FOLIC ACID 1000-200 MCG PO TABS: 0.5000 | ORAL_TABLET | Freq: Every day | ORAL | Status: DC

## 2020-05-18 MED ORDER — LACTATED RINGERS IV BOLUS (SEPSIS)
1000.0000 mL | Freq: Once | INTRAVENOUS | Status: AC
  Administered 2020-05-18: 1000 mL via INTRAVENOUS

## 2020-05-18 MED ORDER — VANCOMYCIN HCL 1500 MG/300ML IV SOLN
1500.0000 mg | Freq: Once | INTRAVENOUS | Status: AC
  Administered 2020-05-18: 1500 mg via INTRAVENOUS
  Filled 2020-05-18: qty 300

## 2020-05-18 MED ORDER — POLYVINYL ALCOHOL 1.4 % OP SOLN
1.0000 [drp] | Freq: Two times a day (BID) | OPHTHALMIC | Status: DC
  Administered 2020-05-18 – 2020-06-05 (×35): 1 [drp] via OPHTHALMIC
  Filled 2020-05-18 (×4): qty 15

## 2020-05-18 MED ORDER — JEVITY 1.5 CAL/FIBER PO LIQD
840.0000 mL | ORAL | Status: DC
  Administered 2020-05-19 – 2020-05-20 (×3): 840 mL
  Filled 2020-05-18 (×3): qty 948

## 2020-05-18 MED ORDER — LACTATED RINGERS IV SOLN: INTRAVENOUS | Status: AC

## 2020-05-18 MED ORDER — GUAIFENESIN 100 MG/5ML PO SYRP
200.0000 mg | ORAL_SOLUTION | Freq: Three times a day (TID) | ORAL | Status: DC | PRN
  Filled 2020-05-18: qty 10

## 2020-05-18 MED ORDER — HYDROCODONE-ACETAMINOPHEN 7.5-325 MG PO TABS
1.0000 | ORAL_TABLET | Freq: Four times a day (QID) | ORAL | Status: DC | PRN
  Administered 2020-05-18 – 2020-06-04 (×12): 1
  Filled 2020-05-18 (×12): qty 1

## 2020-05-18 MED ORDER — OMEPRAZOLE 2 MG/ML ORAL SUSPENSION: 40.0000 mg | Freq: Every day | ORAL | Status: DC

## 2020-05-18 MED ORDER — ASCORBIC ACID 500 MG PO TABS
500.0000 mg | ORAL_TABLET | Freq: Every day | ORAL | Status: DC
  Administered 2020-05-19 – 2020-06-05 (×18): 500 mg
  Filled 2020-05-18 (×18): qty 1

## 2020-05-18 MED ORDER — LACTATED RINGERS IV BOLUS (SEPSIS)
500.0000 mL | Freq: Once | INTRAVENOUS | Status: AC
  Administered 2020-05-18: 500 mL via INTRAVENOUS

## 2020-05-18 MED ORDER — VANCOMYCIN HCL 750 MG/150ML IV SOLN
750.0000 mg | Freq: Two times a day (BID) | INTRAVENOUS | Status: DC
  Administered 2020-05-19 – 2020-05-20 (×3): 750 mg via INTRAVENOUS
  Filled 2020-05-18 (×4): qty 150

## 2020-05-18 MED ORDER — DIAZEPAM 5 MG PO TABS
5.0000 mg | ORAL_TABLET | Freq: Every evening | ORAL | Status: DC | PRN
  Administered 2020-05-18: 5 mg
  Filled 2020-05-18: qty 1

## 2020-05-18 MED ORDER — IPRATROPIUM-ALBUTEROL 0.5-2.5 (3) MG/3ML IN SOLN
3.0000 mL | Freq: Four times a day (QID) | RESPIRATORY_TRACT | Status: DC
  Administered 2020-05-18 – 2020-05-19 (×3): 3 mL via RESPIRATORY_TRACT
  Filled 2020-05-18 (×2): qty 3

## 2020-05-18 MED ORDER — SODIUM CHLORIDE 0.9 % IV SOLN
2.0000 g | Freq: Once | INTRAVENOUS | Status: AC
  Administered 2020-05-18: 2 g via INTRAVENOUS
  Filled 2020-05-18: qty 2

## 2020-05-18 MED ORDER — FREE WATER
240.0000 mL | Status: DC
  Administered 2020-05-18 – 2020-06-05 (×104): 240 mL

## 2020-05-18 MED ORDER — VITAMIN B-12 1000 MCG PO TABS
500.0000 ug | ORAL_TABLET | Freq: Every day | ORAL | Status: DC
  Administered 2020-05-19 – 2020-06-05 (×17): 500 ug
  Filled 2020-05-18 (×18): qty 1

## 2020-05-18 MED ORDER — ACETAMINOPHEN 650 MG RE SUPP
650.0000 mg | Freq: Four times a day (QID) | RECTAL | Status: DC | PRN
  Administered 2020-05-18 – 2020-05-25 (×2): 650 mg via RECTAL
  Filled 2020-05-18 (×2): qty 1

## 2020-05-18 MED ORDER — SODIUM CHLORIDE 0.9 % IV SOLN
500.0000 mg | Freq: Once | INTRAVENOUS | Status: AC
  Administered 2020-05-18: 500 mg via INTRAVENOUS
  Filled 2020-05-18: qty 500

## 2020-05-18 MED ORDER — BACLOFEN 10 MG PO TABS
20.0000 mg | ORAL_TABLET | Freq: Four times a day (QID) | ORAL | Status: DC
  Administered 2020-05-18 – 2020-06-05 (×68): 20 mg
  Filled 2020-05-18 (×12): qty 2
  Filled 2020-05-18: qty 1
  Filled 2020-05-18 (×3): qty 2
  Filled 2020-05-18: qty 1
  Filled 2020-05-18 (×2): qty 2
  Filled 2020-05-18: qty 1
  Filled 2020-05-18: qty 2
  Filled 2020-05-18: qty 1
  Filled 2020-05-18 (×6): qty 2
  Filled 2020-05-18: qty 1
  Filled 2020-05-18 (×4): qty 2
  Filled 2020-05-18 (×3): qty 1
  Filled 2020-05-18 (×6): qty 2
  Filled 2020-05-18 (×3): qty 1
  Filled 2020-05-18 (×6): qty 2
  Filled 2020-05-18: qty 1
  Filled 2020-05-18 (×2): qty 2
  Filled 2020-05-18: qty 1
  Filled 2020-05-18 (×15): qty 2
  Filled 2020-05-18: qty 1
  Filled 2020-05-18: qty 2

## 2020-05-18 MED ORDER — LACTATED RINGERS IV SOLN: INTRAVENOUS | Status: DC

## 2020-05-18 MED ORDER — LEVETIRACETAM 100 MG/ML PO SOLN
1000.0000 mg | Freq: Three times a day (TID) | ORAL | Status: DC
  Administered 2020-05-18 – 2020-06-05 (×53): 1000 mg
  Filled 2020-05-18 (×55): qty 10

## 2020-05-18 MED ORDER — FOLIC ACID 1 MG PO TABS
1.0000 mg | ORAL_TABLET | Freq: Every day | ORAL | Status: DC
  Administered 2020-05-18 – 2020-06-05 (×19): 1 mg
  Filled 2020-05-18 (×19): qty 1

## 2020-05-18 MED ORDER — ADULT MULTIVITAMIN W/MINERALS CH
1.0000 | ORAL_TABLET | Freq: Every day | ORAL | Status: DC
  Administered 2020-05-19 – 2020-06-05 (×18): 1
  Filled 2020-05-18 (×18): qty 1

## 2020-05-18 MED ORDER — DANTROLENE SODIUM 25 MG PO CAPS
50.0000 mg | ORAL_CAPSULE | ORAL | Status: DC
  Administered 2020-05-18 – 2020-06-05 (×67): 50 mg
  Filled 2020-05-18 (×76): qty 2

## 2020-05-18 MED ORDER — PANTOPRAZOLE SODIUM 40 MG PO PACK
40.0000 mg | PACK | Freq: Every day | ORAL | Status: DC
  Administered 2020-05-19 – 2020-06-05 (×18): 40 mg
  Filled 2020-05-18 (×21): qty 20

## 2020-05-18 MED ORDER — BISACODYL 10 MG RE SUPP: 10.0000 mg | Freq: Once | RECTAL | Status: DC | PRN

## 2020-05-18 NOTE — H&P (Signed)
History and Physical    Terry Palmer QVZ:[id number] DOB: Feb 21, 1975 DOA: 05/18/2020  PCP: [name], MD Patient coming from: Home  Chief Complaint: Fevers, decubitus ulcer  HPI: Terry Palmer is a 45 y.o. male with medical history significant of advanced multiple sclerosis, nonverbal at baseline, quadriplegic with contractures, neurogenic bladder with chronic suprapubic catheter, dysphagia with chronic indwelling PEG tube, dementia, history of seizures presented to the ED via EMS for evaluation of fevers and decubitus ulcer getting worse.  History provided by wife at bedside who reports that the patient has been tachycardic and having fevers for the past 2 days.  He has been receiving Tylenol at home.  Yesterday his heart rate was up to the 150s.  This morning his temperature was 101.2 F.  He has appeared more lethargic than his baseline.  Wife states he has a chronic sacral wound which has been worsening for since March with more drainage.  States patient is not vaccinated against COVID and has been coughing.  A week ago he had a chest x-ray done and was diagnosed with pneumonia.  He was started on amoxicillin and since then is having diarrhea.  No vomiting.  Wife states patient has a history of recurrent UTIs.  In the ED, afebrile. Tachycardic with heart rate up to 120s and slightly tachypneic.  Not hypotensive.  Not hypoxic.  Labs showing WBC 19.3, hemoglobin 12.1 (stable compared to prior labs), platelet count 549K.  Sodium 133, potassium 4.1, chloride 100, bicarb 23, BUN 9, creatinine 0.4, glucose 218.  Initial lactic acid 1.4, repeat pending.  INR 1.1.  Blood culture x2 pending.  UA with negative nitrite, large amount of leukocytes, greater than 50 WBCs, and few bacteria.  Urine culture pending.  COVID and influenza panel pending.  Chest x-ray showing left lung base opacity suspicious for pneumonia.  Patient noted to have a large sacral wound.  CT showing significant progression and  soft tissue wound overlying the left ischial tuberosity with underlying bony erosive changes consistent with osteomyelitis. Patient was given vancomycin, cefepime, and azithromycin.  Review of Systems:  All systems reviewed and apart from history of presenting illness, are negative.  Past Medical History:  Diagnosis Date  . Aspiration pneumonia ([place]) 01/20/2013  . Bladder calculi   . Childhood asthma   . 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 Depression   . Dysphagia   . 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 MS (multiple sclerosis) ([place])   . Neurogenic bladder   . Neuromuscular disorder ([place])    Quadraperesis  . Normocytic anemia 05/28/2011  . 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 Quadriplegia and quadriparesis ([place]) 12/24/2014  . Recurrent upper respiratory infection (URI)   . Recurrent UTI   . Seizure disorder ([place])   . Shortness of breath     Past Surgical History:  Procedure Laterality Date  . GASTROSTOMY  04/16/2011   Procedure: GASTROSTOMY;  Surgeon: Zenovia Jarred, MD;  Location: [facility];  Service: General;  Laterality: N/A;  Open G-Tube placement  . LUMBAR PUNCTURE  10/12/2002  . SUPRAPUBIC CYSTOSTOMY       reports that he quit smoking about 21 years ago. His smoking use included cigarettes. He has quit using smokeless tobacco. He reports that he does not drink alcohol. No history on file for drug use.  No Known Allergies  Family History  Problem Relation Age of Onset  . Asthma Mother     Prior to Admission medications   Medication  Sig Start Date End Date Taking? Authorizing Provider  acetaminophen (TYLENOL) 500 MG tablet Take 1 tablet (500 mg total) by mouth every 8 (eight) hours as needed for fever (or pain). Patient taking differently: Take 500 mg by mouth 2 (two) times daily.  03/05/15   Debbe Odea, MD  baclofen (LIORESAL) 20 MG tablet Place 1 tablet (20 mg total) into feeding tube 4 (four) times daily. 07/13/15   Kathrynn Ducking, MD   bisacodyl (DULCOLAX) 10 MG suppository Place 10 mg rectally once as needed for moderate constipation.    [provider]  buPROPion (WELLBUTRIN) 75 MG tablet Place 1 tablet (75 mg total) into feeding tube 3 (three) times daily. 01/24/13   Bonnielee Haff, MD  Cobalamin Combinations (OPURITY B12/FOLIC ACID) 1610-960 MCG TABS Take 0.5 tablets by mouth daily.    [provider]  dantrolene (DANTRIUM) 50 MG capsule Give 50 mg by tube 4 (four) times daily. At 0800, 1200, 1600, 2000    [provider]  diazepam (VALIUM) 5 MG tablet Place 5 mg into feeding tube at bedtime as needed for anxiety (sleep).     [provider]  fexofenadine (ALLEGRA) 180 MG tablet Place 180 mg into feeding tube daily.    [provider]  guaifenesin (ROBITUSSIN) 100 MG/5ML syrup Take 200 mg by mouth 3 (three) times daily as needed for cough.    [provider]  HYDROcodone-acetaminophen (NORCO) 7.5-325 MG per tablet Place 1 tablet into feeding tube every 6 (six) hours as needed (pain).     [provider]  ipratropium-albuterol (DUONEB) 0.5-2.5 (3) MG/3ML SOLN Take 3 mLs by nebulization See admin instructions. Four times daily and as needed for shortness of breath    [provider]  levETIRAcetam (KEPPRA) 500 MG tablet Take 3 tablets (1,500 mg total) by mouth 2 (two) times daily. 05/29/15   Mignon Pine, DO  Multiple Vitamin (MULTIVITAMIN WITH MINERALS) TABS tablet Place 1 tablet into feeding tube daily. 01/24/13   Bonnielee Haff, MD  Nutritional Supplements (FEEDING SUPPLEMENT, JEVITY 1.5 CAL,) LIQD Place 1,000 mLs into feeding tube See admin instructions. 51mL/1hr over 12 hrs    [provider]  omeprazole (PRILOSEC) 2 mg/mL SUSP Place 20 mLs (40 mg total) into feeding tube daily. 01/24/13   Bonnielee Haff, MD  polyethylene glycol [facility] / Floria Raveling) packet Place 17 g into feeding tube daily. Patient taking differently: Place 17 g into  feeding tube every evening.  01/24/13   Bonnielee Haff, MD  polyvinyl alcohol (LIQUIFILM TEARS) 1.4 % ophthalmic solution Place 1 drop into both eyes 3 (three) times daily. Patient taking differently: Place 1 drop into both eyes 2 (two) times daily.  01/24/13   Bonnielee Haff, MD  senna (SENOKOT) 8.6 MG TABS tablet Take 2 tablets by mouth at bedtime.     [provider]  sodium phosphate (FLEET) enema Place 1 enema rectally once as needed (for constipation). follow package directions    [provider]  tiZANidine (ZANAFLEX) 2 MG tablet Take 1 tablet (2 mg total) by mouth every 6 (six) hours as needed for muscle spasms. Patient taking differently: Place 2 mg into feeding tube 3 (three) times daily.  11/25/15   Kathrynn Ducking, MD  vitamin C (ASCORBIC ACID) 500 MG tablet Place 500 mg into feeding tube daily.     [provider]  Water For Irrigation, Sterile (FREE WATER) SOLN Place 240 mLs into feeding tube every 4 (four) hours. 01/24/13   [place] Pink,  Nicki [place], MD    Physical Exam: Vitals:   05/18/20 1800 05/18/20 1910 05/18/20 1933 05/18/20 1936  BP: (!) 151/87  (!) 158/97   Pulse: (!) 112 (!) 111 (!) 109 (!) 118  Resp: (!) 22 19 16  (!) 23  Temp:      TempSrc:      SpO2: 99% 98% 97% 99%    Physical Exam Constitutional:      General: He is not in acute distress.    Appearance: He is not diaphoretic.  HENT:     Head: Normocephalic and atraumatic.     Mouth/Throat:     Mouth: Mucous membranes are dry.  Cardiovascular:     Rate and Rhythm: Normal rate and regular rhythm.     Pulses: Normal pulses.  Pulmonary:     Effort: Pulmonary effort is normal. No respiratory distress.     Breath sounds: No wheezing.  Abdominal:     General: Bowel sounds are normal. There is no distension.     Palpations: Abdomen is soft.     Tenderness: There is no abdominal tenderness. There is no guarding.  Musculoskeletal:        General: No swelling or tenderness.     Cervical  back: Normal range of motion and neck supple.     Comments: Bilateral upper and lower extremity contractures  Skin:    General: Skin is warm and dry.  Neurological:     Mental Status: Mental status is at baseline.       Labs on Admission: I have personally reviewed following labs and imaging studies  CBC: Recent Labs  Lab 05/18/20 1804 05/18/20 1811  WBC 19.3*  --   NEUTROABS 15.0*  --   HGB 12.1* 12.9*  HCT 37.9* 38.0*  MCV 91.1  --   PLT 549*  --    Basic Metabolic Panel: Recent Labs  Lab 05/18/20 1804 05/18/20 1811  NA 133* 135  K 4.1 4.1  CL 100 100  CO2 23  --   GLUCOSE 218* 222*  BUN 9 9  CREATININE 0.40* 0.30*  CALCIUM 9.0  --    GFR: CrCl cannot be calculated (Unknown ideal weight.). Liver Function Tests: Recent Labs  Lab 05/18/20 1804  AST 26  ALT 25  ALKPHOS 107  BILITOT 0.2*  PROT 7.6  ALBUMIN 2.7*   Recent Labs  Lab 05/18/20 1804  LIPASE 28   No results for input(s): AMMONIA in the last 168 hours. Coagulation Profile: Recent Labs  Lab 05/18/20 1804  INR 1.1   Cardiac Enzymes: No results for input(s): CKTOTAL, CKMB, CKMBINDEX, TROPONINI in the last 168 hours. BNP (last 3 results) No results for input(s): PROBNP in the last 8760 hours. HbA1C: No results for input(s): HGBA1C in the last 72 hours. CBG: Recent Labs  Lab 05/18/20 1751  GLUCAP 218*   Lipid Profile: No results for input(s): CHOL, HDL, LDLCALC, TRIG, CHOLHDL, LDLDIRECT in the last 72 hours. Thyroid Function Tests: No results for input(s): TSH, T4TOTAL, FREET4, T3FREE, THYROIDAB in the last 72 hours. Anemia Panel: No results for input(s): VITAMINB12, FOLATE, FERRITIN, TIBC, IRON, RETICCTPCT in the last 72 hours. Urine analysis:    Component Value Date/Time   COLORURINE YELLOW 05/18/2020 1824   APPEARANCEUR CLOUDY (A) 05/18/2020 1824   LABSPEC 1.009 05/18/2020 1824   PHURINE 7.0 05/18/2020 1824   GLUCOSEU NEGATIVE 05/18/2020 1824   HGBUR NEGATIVE 05/18/2020  1824   BILIRUBINUR NEGATIVE 05/18/2020 [place] 05/18/2020 1824  PROTEINUR 30 (A) 05/18/2020 1824   UROBILINOGEN 0.2 10/20/2014 1328   NITRITE NEGATIVE 05/18/2020 1824   LEUKOCYTESUR LARGE (A) 05/18/2020 1824    Radiological Exams on Admission: CT ABDOMEN PELVIS WO CONTRAST  Result Date: 05/18/2020 CLINICAL DATA:  Abdominal pain and ischial tuberosity wound, initial encounter EXAM: CT ABDOMEN AND PELVIS WITHOUT CONTRAST TECHNIQUE: Multidetector CT imaging of the abdomen and pelvis was performed following the standard protocol without IV contrast. COMPARISON:  12/08/2017 FINDINGS: Lower chest: Lung bases demonstrate mild scarring bilaterally stable from the prior exam. Hepatobiliary: Liver is within normal limits. Gallbladder is well distended with dependent density consistent with small stones. Pancreas: Unremarkable. No pancreatic ductal dilatation or surrounding inflammatory changes. Spleen: Normal in size without focal abnormality. Adrenals/Urinary Tract: Adrenal glands are within normal limits. Kidneys demonstrate no renal calculi or urinary tract obstructive changes. The ureters are within normal limits. The bladder is decompressed by a suprapubic catheter. Stomach/Bowel: Colon shows no obstructive or inflammatory changes. Mild retained fecal material is seen although no obstructive changes are noted. The appendix is within normal limits. Small bowel and stomach are within normal limits with the exception of a gastrostomy catheter. Vascular/Lymphatic: Aortic atherosclerosis. No enlarged abdominal or pelvic lymph nodes. Reproductive: Prostate is unremarkable. Other: No abdominal wall hernia or abnormality. No abdominopelvic ascites. Musculoskeletal: Degenerative changes of lumbar spine are noted with scoliosis concave to the right. Sacrum is within normal limits. There is a soft tissue wound overlying the left ischial tuberosity posteriorly. This is consistent with a decubitus  wound. Erosive changes of the underlying ischial tuberosity are seen posteriorly consistent with osteomyelitis. Chronic dislocation of the hips is noted bilaterally. IMPRESSION: Significant progression in soft tissue wound overlying the left ischial tuberosity with underlying bony erosive changes consistent with osteomyelitis. Cholelithiasis without complicating factors. 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 Electronically Signed   By: Inez Catalina M.D.   On: 05/18/2020 19:28   DG Chest Port 1 View  Result Date: 05/18/2020 CLINICAL DATA:  Unresponsive.  Seizure disorder EXAM: PORTABLE CHEST 1 VIEW COMPARISON:  December 07, 2017 FINDINGS: There is ill-defined airspace opacity in the left base. The lungs elsewhere are clear. The heart size and pulmonary vascularity are normal. No adenopathy. No evident bone lesions. IMPRESSION: Ill-defined airspace opacity consistent with pneumonia left base. Lungs otherwise clear. Cardiac silhouette normal. Electronically Signed   By: Lowella Grip III M.D.   On: 05/18/2020 18:34    EKG: Independently reviewed.  Sinus tachycardia, borderline T wave abnormalities.  No significant change since prior tracing.  Assessment/Plan Principal Problem:   Sepsis ([place]) Active Problems:   Multiple sclerosis ([facility])   UTI (urinary tract infection)   CAP (community acquired pneumonia)   Osteomyelitis ([place])   Large sacral wound with signs of osteomyelitis: CT showing significant progression and soft tissue wound overlying the left ischial tuberosity with underlying bony erosive changes consistent with osteomyelitis. -Continue vancomycin and cefepime.  Blood culture x2 pending.  Consult surgery in the morning.  Community-acquired pneumonia: Chest x-ray showing left lung base opacity suspicious for pneumonia in setting of cough.  Patient is not hypoxic.  Not vaccinated against COVID. -Continue cefepime and azithromycin.  COVID and influenza test pending,  airborne and contact precautions.  Continuous pulse ox, supplemental oxygen if needed.  Possible catheter associated UTI UA with signs of infection in the setting of chronic suprapubic catheter. -Continue cefepime.  Urine culture pending.  Sepsis: Likely multifactorial in the setting of osteomyelitis, CAP, and possible CAUTI.  Patient was having fevers at home and was given Tylenol.  Afebrile upon arrival to the ED, however tachycardic and tachypneic.  Labs showing leukocytosis with WBC count 19.3.  No hypotension or lactic acidosis to suggest severe sepsis.  Initial lactic acid 1.4. -Continue antibiotics.  Give 30 cc/kg fluid boluses per sepsis protocol.  Second set of lactate pending.  Blood culture x2 pending.  Urine culture pending.  Continue to monitor WBC count.  Diarrhea: Likely related to recent antibiotic use.  He is also receiving Dulcolax and MiraLAX at home.  Abdominal exam benign. -C. difficile PCR and GI pathogen panel, follow enteric precautions  Advanced underlying MS causing functional quadriplegia, dysphagia with chronic indwelling PEG, neurogenic bladder with chronic suprapubic catheter -Continue supportive care  Seizure disorder -Continue Keppra  DVT prophylaxis: SCDs at this time, pending evaluation by surgical team Code Status: Full code.  Discussed with the patient's wife at bedside. Family Communication: Wife updated. Disposition Plan: Status is: Inpatient  Remains inpatient appropriate because:IV treatments appropriate due to intensity of illness or inability to take PO and Inpatient level of care appropriate due to severity of illness   Dispo: The patient is from: Home              Anticipated d/c is to: SNF              Patient currently is not medically stable to d/c.   Difficult to place patient No  Level of care: Level of care: Telemetry Medical   The medical decision making on this patient was of high complexity and the patient is at high risk for  clinical deterioration, therefore this is a level 3 visit.  Shela Leff MD [facility]  If 7PM-7AM, please contact night-coverage [email or web address]  05/18/2020, 8:53 PM

## 2020-05-18 NOTE — Sepsis Progress Note (Signed)
eLink is monitoring this Code Sepsis. °

## 2020-05-18 NOTE — ED Provider Notes (Signed)
[facility] EMERGENCY DEPARTMENT Provider Note   CSN: [id number] Arrival date & time: 05/18/20  1744     History Chief Complaint  Patient presents with  . Code Sepsis    Terry Palmer is a 45 y.o. male presented for evaluation of fever and tachycardia.  Level 5 caveat due to patient being nonverbal.  History provided by wife.  She states that for the past week, patient has had intermittent tachycardia, with heart rate up to 150.  He is also having nightly fevers, last fever was this morning at 101.  Fever will resolve with Tylenol.  He has been less responsive than his baseline in the past couple days.  He is also having a worsening sacral wound.  Cough is noted today.  No vomiting, change in urine or bowel output.  No recent change in medications.   Additional history obtained from chart review.  Patient with a history of MS with subsequent dementia, depression, quadriparesis with contractures, seizure disorder  HPI     Past Medical History:  Diagnosis Date  . Aspiration pneumonia ([place]) 01/20/2013  . Bladder calculi   . Childhood asthma   . 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 Depression   . Dysphagia   . 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 MS (multiple sclerosis) ([place])   . Neurogenic bladder   . Neuromuscular disorder ([place])    Quadraperesis  . Normocytic anemia 05/28/2011  . 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 Quadriplegia and quadriparesis ([place]) 12/24/2014  . Recurrent upper respiratory infection (URI)   . Recurrent UTI   . Seizure disorder ([place])   . Shortness of breath     Patient Active Problem List   Diagnosis Date Noted  . Goals of care, counseling/discussion   . Palliative care by specialist   . DNR (do not resuscitate) discussion   . Palliative care encounter   . Acute metabolic encephalopathy 93/23/5573  . Pneumonia due to infectious organism   . 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  Lactic acidosis 04/27/2016  . Acute cystitis with hematuria   . Suprapubic catheter ([place])   . G tube feedings ([place])   . Flexion contractures   . Encephalopathy   . Bacteremia 05/27/2015  . Sepsis ([place]) 05/25/2015  . Altered mental state 05/25/2015  . Altered mental status 05/25/2015  . Fever, unspecified 03/04/2015  . Quadriplegia and quadriparesis ([place]) 12/24/2014  . 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 Fever   . 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 Protein-calorie malnutrition, severe ([place]) 10/21/2014  . UTI (urinary tract infection) 10/20/2014  . Sepsis secondary to UTI ([place]) 10/20/2014  . Leukocytosis 10/20/2014  . Acute respiratory failure with hypoxia ([place]) 10/20/2014  . Seizure disorder ([place]) 10/20/2014  . Aspiration pneumonitis ([place]) 10/20/2014  . Neurogenic bladder 05/28/2011  . Multiple sclerosis ([place]) 04/20/2011  . FTT (failure to thrive) in adult 04/20/2011  . Sacral decubitus ulcer, stage II ([place]) 04/20/2011  . Quadriparesis (muscle weakness) 03/12/2011    Past Surgical History:  Procedure Laterality Date  . GASTROSTOMY  04/16/2011   Procedure: GASTROSTOMY;  Surgeon: Zenovia Jarred, MD;  Location: [facility];  Service: General;  Laterality: N/A;  Open G-Tube placement  . LUMBAR PUNCTURE  10/12/2002  . SUPRAPUBIC CYSTOSTOMY         Family History  Problem Relation Age of Onset  . Asthma Mother     Social History   Tobacco Use  . Smoking status: Former Smoker  Types: Cigarettes    Quit date: 02/02/1999    Years since quitting: 21.3  . Smokeless tobacco: Former Network engineer Use Topics  . Alcohol use: No    Home Medications Prior to Admission medications   Medication Sig Start Date End Date Taking? Authorizing Provider  acetaminophen (TYLENOL) 500 MG tablet Take 1 tablet (500 mg total) by mouth every 8 (eight) hours as needed for fever (or pain). Patient taking differently: Take 500 mg by mouth 2 (two) times daily.  03/05/15    Debbe Odea, MD  baclofen (LIORESAL) 20 MG tablet Place 1 tablet (20 mg total) into feeding tube 4 (four) times daily. 07/13/15   Kathrynn Ducking, MD  bisacodyl (DULCOLAX) 10 MG suppository Place 10 mg rectally once as needed for moderate constipation.    [provider]  buPROPion (WELLBUTRIN) 75 MG tablet Place 1 tablet (75 mg total) into feeding tube 3 (three) times daily. 01/24/13   Bonnielee Haff, MD  Cobalamin Combinations (OPURITY B12/FOLIC ACID) A999333 MCG TABS Take 0.5 tablets by mouth daily.    [provider]  dantrolene (DANTRIUM) 50 MG capsule Give 50 mg by tube 4 (four) times daily. At 0800, 1200, 1600, 2000    [provider]  diazepam (VALIUM) 5 MG tablet Place 5 mg into feeding tube at bedtime as needed for anxiety (sleep).     [provider]  fexofenadine (ALLEGRA) 180 MG tablet Place 180 mg into feeding tube daily.    [provider]  guaifenesin (ROBITUSSIN) 100 MG/5ML syrup Take 200 mg by mouth 3 (three) times daily as needed for cough.    [provider]  HYDROcodone-acetaminophen (NORCO) 7.5-325 MG per tablet Place 1 tablet into feeding tube every 6 (six) hours as needed (pain).     [provider]  ipratropium-albuterol (DUONEB) 0.5-2.5 (3) MG/3ML SOLN Take 3 mLs by nebulization See admin instructions. Four times daily and as needed for shortness of breath    [provider]  levETIRAcetam (KEPPRA) 500 MG tablet Take 3 tablets (1,500 mg total) by mouth 2 (two) times daily. 05/29/15   Mignon Pine, DO  Multiple Vitamin (MULTIVITAMIN WITH MINERALS) TABS tablet Place 1 tablet into feeding tube daily. 01/24/13   Bonnielee Haff, MD  Nutritional Supplements (FEEDING SUPPLEMENT, JEVITY 1.5 CAL,) LIQD Place 1,000 mLs into feeding tube See admin instructions. 74mL/1hr over 12 hrs    [provider]  omeprazole (PRILOSEC) 2 mg/mL SUSP Place 20 mLs (40 mg total) into feeding tube daily. 01/24/13    Bonnielee Haff, MD  polyethylene glycol [facility] Floria Raveling) packet Place 17 g into feeding tube daily. Patient taking differently: Place 17 g into feeding tube every evening.  01/24/13   Bonnielee Haff, MD  polyvinyl alcohol (LIQUIFILM TEARS) 1.4 % ophthalmic solution Place 1 drop into both eyes 3 (three) times daily. Patient taking differently: Place 1 drop into both eyes 2 (two) times daily.  01/24/13   Bonnielee Haff, MD  senna (SENOKOT) 8.6 MG TABS tablet Take 2 tablets by mouth at bedtime.     [provider]  sodium phosphate (FLEET) enema Place 1 enema rectally once as needed (for constipation). follow package directions    [provider]  tiZANidine (ZANAFLEX) 2 MG tablet Take 1 tablet (2 mg total) by mouth every 6 (six) hours as needed for muscle spasms. Patient taking differently: Place 2 mg into feeding tube 3 (three) times daily.  11/25/15   Kathrynn Ducking, MD  vitamin  C (ASCORBIC ACID) 500 MG tablet Place 500 mg into feeding tube daily.     [provider]  Water For Irrigation, Sterile (FREE WATER) SOLN Place 240 mLs into feeding tube every 4 (four) hours. 01/24/13   Bonnielee Haff, MD    Allergies    Patient has no known allergies.  Review of Systems   Review of Systems  Unable to perform ROS: Patient nonverbal  Constitutional: Positive for fever.  Skin: Positive for wound.    Physical Exam Updated Vital Signs BP (!) 158/97   Pulse (!) 118   Temp 98.1 F (36.7 C) (Rectal)   Resp (!) 23   SpO2 99%   Physical Exam Vitals and nursing note reviewed. Exam conducted with a chaperone present.  Constitutional:      General: He is not in acute distress.    Appearance: He is well-developed. He is ill-appearing.     Comments: Chronically ill appearing  HENT:     Head: Normocephalic and atraumatic.  Eyes:     Pupils: Pupils are equal, round, and reactive to light.  Cardiovascular:     Rate and Rhythm: Regular rhythm. Tachycardia present.      Pulses: Normal pulses.     Comments: Tachycardic around 120 Pulmonary:     Effort: Pulmonary effort is normal. Tachypnea present. No respiratory distress.     Breath sounds: Examination of the left-lower field reveals decreased breath sounds. Decreased breath sounds present.     Comments: Diminished lung sounds in left lower base Abdominal:     General: There is no distension.     Palpations: Abdomen is soft. There is no mass.     Tenderness: There is no abdominal tenderness. There is no guarding or rebound.  Genitourinary:    Comments: Large sacral wound, see picture below.  No active discharge Musculoskeletal:     Cervical back: Normal range of motion and neck supple.     Comments: Contracture in all extremities.  Skin:    General: Skin is warm and dry.  Neurological:     Mental Status: He is alert and oriented to person, place, and time.          ED Results / Procedures / Treatments   Labs (all labs ordered are listed, but only abnormal results are displayed) Labs Reviewed  COMPREHENSIVE METABOLIC PANEL - Abnormal; Notable for the following components:      Result Value   Sodium 133 (*)    Glucose, Bld 218 (*)    Creatinine, Ser 0.40 (*)    Albumin 2.7 (*)    Total Bilirubin 0.2 (*)    All other components within normal limits  CBC WITH DIFFERENTIAL/PLATELET - Abnormal; Notable for the following components:   WBC 19.3 (*)    RBC 4.16 (*)    Hemoglobin 12.1 (*)    HCT 37.9 (*)    Platelets 549 (*)    Neutro Abs 15.0 (*)    Monocytes Absolute 1.4 (*)    Abs Immature Granulocytes 0.16 (*)    All other components within normal limits  APTT - Abnormal; Notable for the following components:   aPTT 41 (*)    All other components within normal limits  URINALYSIS, ROUTINE W REFLEX MICROSCOPIC - Abnormal; Notable for the following components:   APPearance CLOUDY (*)    Protein, ur 30 (*)    Leukocytes,Ua LARGE (*)    WBC, UA >50 (*)    Bacteria, UA FEW (*)  Non Squamous Epithelial 0-5 (*)    All other components within normal limits  I-STAT CHEM 8, ED - Abnormal; Notable for the following components:   Creatinine, Ser 0.30 (*)    Glucose, Bld 222 (*)    Calcium, Ion 1.10 (*)    Hemoglobin 12.9 (*)    HCT 38.0 (*)    All other components within normal limits  CBG MONITORING, ED - Abnormal; Notable for the following components:   Glucose-Capillary 218 (*)    All other components within normal limits  URINE CULTURE  CULTURE, BLOOD (ROUTINE X 2)  CULTURE, BLOOD (ROUTINE X 2)  RESP PANEL BY RT-PCR (FLU A&B, COVID) ARPGX2  LACTIC ACID, PLASMA  PROTIME-INR  LIPASE, BLOOD  LACTIC ACID, PLASMA    EKG None  Radiology CT ABDOMEN PELVIS WO CONTRAST  Result Date: 05/18/2020 CLINICAL DATA:  Abdominal pain and ischial tuberosity wound, initial encounter EXAM: CT ABDOMEN AND PELVIS WITHOUT CONTRAST TECHNIQUE: Multidetector CT imaging of the abdomen and pelvis was performed following the standard protocol without IV contrast. COMPARISON:  12/08/2017 FINDINGS: Lower chest: Lung bases demonstrate mild scarring bilaterally stable from the prior exam. Hepatobiliary: Liver is within normal limits. Gallbladder is well distended with dependent density consistent with small stones. Pancreas: Unremarkable. No pancreatic ductal dilatation or surrounding inflammatory changes. Spleen: Normal in size without focal abnormality. Adrenals/Urinary Tract: Adrenal glands are within normal limits. Kidneys demonstrate no renal calculi or urinary tract obstructive changes. The ureters are within normal limits. The bladder is decompressed by a suprapubic catheter. Stomach/Bowel: Colon shows no obstructive or inflammatory changes. Mild retained fecal material is seen although no obstructive changes are noted. The appendix is within normal limits. Small bowel and stomach are within normal limits with the exception of a gastrostomy catheter. Vascular/Lymphatic: Aortic  atherosclerosis. No enlarged abdominal or pelvic lymph nodes. Reproductive: Prostate is unremarkable. Other: No abdominal wall hernia or abnormality. No abdominopelvic ascites. Musculoskeletal: Degenerative changes of lumbar spine are noted with scoliosis concave to the right. Sacrum is within normal limits. There is a soft tissue wound overlying the left ischial tuberosity posteriorly. This is consistent with a decubitus wound. Erosive changes of the underlying ischial tuberosity are seen posteriorly consistent with osteomyelitis. Chronic dislocation of the hips is noted bilaterally. IMPRESSION: Significant progression in soft tissue wound overlying the left ischial tuberosity with underlying bony erosive changes consistent with osteomyelitis. Cholelithiasis without complicating factors. 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 Electronically Signed   By: Inez Catalina M.D.   On: 05/18/2020 19:28   DG Chest Port 1 View  Result Date: 05/18/2020 CLINICAL DATA:  Unresponsive.  Seizure disorder EXAM: PORTABLE CHEST 1 VIEW COMPARISON:  December 07, 2017 FINDINGS: There is ill-defined airspace opacity in the left base. The lungs elsewhere are clear. The heart size and pulmonary vascularity are normal. No adenopathy. No evident bone lesions. IMPRESSION: Ill-defined airspace opacity consistent with pneumonia left base. Lungs otherwise clear. Cardiac silhouette normal. Electronically Signed   By: Lowella Grip III M.D.   On: 05/18/2020 18:34    Procedures .Critical Care Performed by: Franchot Heidelberg, PA-C Authorized by: Franchot Heidelberg, PA-C   Critical care provider statement:    Critical care time (minutes):  50   Critical care time was exclusive of:  Separately billable procedures and treating other patients and teaching time   Critical care was necessary to treat or prevent imminent or life-threatening deterioration of the following conditions:  Sepsis   Critical care was time  spent  personally by me on the following activities:  Blood draw for specimens, development of treatment plan with patient or surrogate, evaluation of patient's response to treatment, examination of patient, obtaining history from patient or surrogate, ordering and performing treatments and interventions, ordering and review of laboratory studies, ordering and review of radiographic studies, pulse oximetry, re-evaluation of patient's condition and review of old charts   I assumed direction of critical care for this patient from another provider in my specialty: no     Care discussed with: admitting provider   Comments:     Patient admitted for sepsis.     Medications Ordered in ED Medications  lactated ringers infusion ( Intravenous New Bag/Given 05/18/20 1931)  vancomycin (VANCOREADY) IVPB 1500 mg/300 mL (1,500 mg Intravenous New Bag/Given 05/18/20 2004)  azithromycin (ZITHROMAX) 500 mg in sodium chloride 0.9 % 250 mL IVPB (500 mg Intravenous New Bag/Given 05/18/20 1930)  ceFEPIme (MAXIPIME) 2 g in sodium chloride 0.9 % 100 mL IVPB (0 g Intravenous Stopped 05/18/20 2002)    ED Course  I have reviewed the triage vital signs and the nursing notes.  Pertinent labs & imaging results that were available during my care of the patient were reviewed by me and considered in my medical decision making (see chart for details).    MDM Rules/Calculators/A&P                          Patient presented to the ER for abnormal vital signs.  On exam, patient appears chronically ill.  He is tachycardic and has reported fevers at home.  Concern for sepsis.  He has a large sacral wound, which is likely the source.  However also consider other causes including pneumonia, UTI.  Consider metabolic abnormalities.  Will obtain labs, urine, chest x-ray and continue to evaluate.  Labs show leukocytosis of 19.  Code sepsis called and antibiotics started.  Chest x-ray viewed and independently interpreted by me, shows  opacity of the left lower lobe.  In the setting of fever, tachycardia and cough, concern for pneumonia.  Urine also consistent with infection, although patient has a cath and this may be chronic.  CT shows large sacral wound with signs of osteomyelitis.  Discussed with Dr. Marlowe Sax from [facility] hospitalist service, patient to be admitted.  Final Clinical Impression(s) / ED Diagnoses Final diagnoses:  Sepsis, due to unspecified organism, unspecified whether acute organ dysfunction present [facility])  Osteomyelitis of sacroiliac region [facility])    Rx / DC Orders ED Discharge Orders    None       Franchot Heidelberg, PA-C 05/18/20 2012    Tegeler, Gwenyth Allegra, MD 05/18/20 [phone]

## 2020-05-18 NOTE — ED Notes (Signed)
Patient transported to CT 

## 2020-05-18 NOTE — Progress Notes (Signed)
Pharmacy Antibiotic Note  Terry Palmer is a 45 y.o. male admitted on 05/18/2020 with pneumonia.  Pharmacy has been consulted for cefepime and vancomycin dosing.  WBC 19.3. SCr low. LA 1.4   Plan: -Cefepime 2 gm IV Q 8 hours -Vancomycin 1500 mg IV load followed by Vancomycin 1250 mg IV Q 12 hrs. Goal AUC 400-550. Expected AUC: 486 SCr used: 0.7 -Monitor CBC, renal fx, cultures and clinical progress -Vanc levels as indicated       Temp (24hrs), Avg:98.1 F (36.7 C), Min:98.1 F (36.7 C), Max:98.1 F (36.7 C)  Recent Labs  Lab 05/18/20 1804 05/18/20 1811  WBC 19.3*  --   CREATININE 0.40* 0.30*  LATICACIDVEN 1.4  --     CrCl cannot be calculated (Unknown ideal weight.).    No Known Allergies  Antimicrobials this admission: Cefepime 5/11 >>  Vancomycin 5/11 >>   Dose adjustments this admission:  Microbiology results: 5/11 BCx:  5/11 UCx:    Thank you for allowing pharmacy to be a part of this patient's care.  Albertina Parr, PharmD., BCPS, BCCCP Clinical Pharmacist Please refer to [facility] for unit-specific pharmacist

## 2020-05-18 NOTE — ED Notes (Signed)
Received verbal report from Huntley Estelle. RN

## 2020-05-18 NOTE — ED Triage Notes (Signed)
Per EMS: PT from home with family. Decub ulcer getting worse, fevers for the last few days. More moaning and seems uncomfortable.

## 2020-05-18 NOTE — ED Notes (Signed)
Pt oral temp of 100. Dr. Marlowe Sax paged and told this RN to go ahead and give tylenol

## 2020-05-18 NOTE — ED Notes (Signed)
Urine bag and tubing were changed.

## 2020-05-18 NOTE — ED Notes (Signed)
Terry Palmer wife phone # [phone] call with update and room assignments

## 2020-05-19 DIAGNOSIS — A419 Sepsis, unspecified organism: Secondary | ICD-10-CM | POA: Diagnosis not present

## 2020-05-19 LAB — BLOOD CULTURE ID PANEL (REFLEXED) - BCID2

## 2020-05-19 LAB — C DIFFICILE QUICK SCREEN W PCR REFLEX
C Diff antigen: NEGATIVE
C Diff interpretation: NOT DETECTED
C Diff toxin: NEGATIVE

## 2020-05-19 LAB — CBC
HCT: 36.2 % — ABNORMAL LOW (ref 39.0–52.0)
Hemoglobin: 11.6 g/dL — ABNORMAL LOW (ref 13.0–17.0)
MCH: 29.5 pg (ref 26.0–34.0)
MCHC: 32 g/dL (ref 30.0–36.0)
MCV: 92.1 fL (ref 80.0–100.0)
Platelets: 499 10*3/uL — ABNORMAL HIGH (ref 150–400)
RBC: 3.93 MIL/uL — ABNORMAL LOW (ref 4.22–5.81)
RDW: 12.7 % (ref 11.5–15.5)
WBC: 17.1 10*3/uL — ABNORMAL HIGH (ref 4.0–10.5)
nRBC: 0 % (ref 0.0–0.2)

## 2020-05-19 LAB — BASIC METABOLIC PANEL
Anion gap: 11 (ref 5–15)
BUN: 6 mg/dL (ref 6–20)
CO2: 23 mmol/L (ref 22–32)
Calcium: 8.9 mg/dL (ref 8.9–10.3)
Chloride: 101 mmol/L (ref 98–111)
Creatinine, Ser: <0.3 mg/dL — ABNORMAL LOW (ref 0.61–1.24)
Glucose, Bld: 140 mg/dL — ABNORMAL HIGH (ref 70–99)
Potassium: 3.8 mmol/L (ref 3.5–5.1)
Sodium: 135 mmol/L (ref 135–145)

## 2020-05-19 LAB — HIV ANTIBODY (ROUTINE TESTING W REFLEX): HIV Screen 4th Generation wRfx: NONREACTIVE

## 2020-05-19 MED ORDER — SODIUM CHLORIDE 0.9 % IV SOLN: INTRAVENOUS | Status: DC

## 2020-05-19 MED ORDER — IPRATROPIUM-ALBUTEROL 0.5-2.5 (3) MG/3ML IN SOLN: 3.0000 mL | Freq: Four times a day (QID) | RESPIRATORY_TRACT | Status: DC | PRN

## 2020-05-19 MED ORDER — SODIUM CHLORIDE 0.9 % IV SOLN: INTRAVENOUS | Status: AC

## 2020-05-19 MED ORDER — SODIUM CHLORIDE 0.9 % IV SOLN
3.0000 g | Freq: Four times a day (QID) | INTRAVENOUS | Status: DC
  Administered 2020-05-19 – 2020-06-05 (×67): 3 g via INTRAVENOUS
  Filled 2020-05-19: qty 8
  Filled 2020-05-19 (×2): qty 3
  Filled 2020-05-19 (×2): qty 8
  Filled 2020-05-19: qty 3
  Filled 2020-05-19 (×2): qty 8
  Filled 2020-05-19 (×2): qty 3
  Filled 2020-05-19: qty 8
  Filled 2020-05-19 (×2): qty 3
  Filled 2020-05-19: qty 8
  Filled 2020-05-19: qty 3
  Filled 2020-05-19: qty 8
  Filled 2020-05-19: qty 3
  Filled 2020-05-19: qty 8
  Filled 2020-05-19 (×2): qty 3
  Filled 2020-05-19: qty 8
  Filled 2020-05-19: qty 3
  Filled 2020-05-19 (×3): qty 8
  Filled 2020-05-19: qty 3
  Filled 2020-05-19: qty 8
  Filled 2020-05-19: qty 3
  Filled 2020-05-19: qty 8
  Filled 2020-05-19 (×2): qty 3
  Filled 2020-05-19 (×2): qty 8
  Filled 2020-05-19 (×4): qty 3
  Filled 2020-05-19: qty 8
  Filled 2020-05-19 (×3): qty 3
  Filled 2020-05-19 (×3): qty 8
  Filled 2020-05-19 (×2): qty 3
  Filled 2020-05-19 (×4): qty 8
  Filled 2020-05-19: qty 3
  Filled 2020-05-19: qty 8
  Filled 2020-05-19: qty 3
  Filled 2020-05-19: qty 8
  Filled 2020-05-19 (×3): qty 3
  Filled 2020-05-19: qty 8
  Filled 2020-05-19: qty 3
  Filled 2020-05-19: qty 8
  Filled 2020-05-19 (×3): qty 3
  Filled 2020-05-19: qty 8
  Filled 2020-05-19: qty 3
  Filled 2020-05-19 (×2): qty 8
  Filled 2020-05-19 (×2): qty 3
  Filled 2020-05-19 (×2): qty 8
  Filled 2020-05-19 (×2): qty 3
  Filled 2020-05-19 (×2): qty 8

## 2020-05-19 MED ORDER — CHLORHEXIDINE GLUCONATE CLOTH 2 % EX PADS
6.0000 | MEDICATED_PAD | Freq: Every day | CUTANEOUS | Status: DC
  Administered 2020-05-19 – 2020-06-05 (×17): 6 via TOPICAL

## 2020-05-19 NOTE — ED Notes (Signed)
Lab at bedside at this time.  

## 2020-05-19 NOTE — ED Notes (Signed)
Attempted to call report-no answer 

## 2020-05-19 NOTE — Plan of Care (Signed)
  Problem: Clinical Measurements: Goal: Will remain free from infection Outcome: Progressing Goal: Diagnostic test results will improve Outcome: Progressing Goal: Respiratory complications will improve Outcome: Progressing Goal: Cardiovascular complication will be avoided Outcome: Progressing   

## 2020-05-19 NOTE — Progress Notes (Signed)
   05/19/20 0929  Assess: MEWS Score  Temp 98.8 F (37.1 C)  BP (!) 158/98  Pulse Rate (!) 120  ECG Heart Rate (!) 121  Resp 18  Level of Consciousness Responds to Pain  SpO2 97 %  O2 Device Room Air  Assess: MEWS Score  MEWS Temp 0  MEWS Systolic 0  MEWS Pulse 2  MEWS RR 0  MEWS LOC 2  MEWS Score 4  MEWS Score Color Red  Assess: if the MEWS score is Yellow or Red  Were vital signs taken at a resting state? Yes  Focused Assessment No change from prior assessment  Early Detection of Sepsis Score *See Row Information* Low  MEWS guidelines implemented *See Row Information* No, previously red, continue vital signs every 4 hours  Treat  MEWS Interventions Other (Comment) (Dr Doristine Bosworth notified and he saw Pt at bedside)  Pain Scale Faces  Faces Pain Scale 0  Notify: Charge Nurse/RN  Name of Charge Nurse/RN Notified Ben RN  Date Charge Nurse/RN Notified 05/19/20  Time Charge Nurse/RN Notified [id number]  Notify: Provider  Provider Name/Title Dr Doristine Bosworth  Date Provider Notified 05/19/20  Time Provider Notified [phone]  Notification Type Face-to-face  Notification Reason Other (Comment) (MD to discuss with Family)  Provider response No new orders  Date of Provider Response 05/19/20  Time of Provider Response [phone]  Document  Patient Outcome Other (Comment) (MD will discuss goals of care with family)  Progress note created (see row info) Yes  Per MD , pt will always trigger red MEWS and next intervention will be to discuss goals of care and code status with family

## 2020-05-19 NOTE — Progress Notes (Signed)
PHARMACY - PHYSICIAN COMMUNICATION CRITICAL VALUE ALERT - BLOOD CULTURE IDENTIFICATION (BCID)  Terry Palmer is an 45 y.o. male who presented to [facility] on 05/18/2020 with a chief complaint of osteo.   Name of physician (or Provider) Contacted: [place]  Current antibiotics: vanco + Unasyn  Changes to prescribed antibiotics recommended:  Patient is on recommended antibiotics - No changes needed  Results for orders placed or performed during the hospital encounter of 05/18/20  Blood Culture ID Panel (Reflexed) (Collected: 05/18/2020  5:53 PM)  Result Value Ref Range   Enterococcus faecalis NOT DETECTED NOT DETECTED   Enterococcus Faecium NOT DETECTED NOT DETECTED   Listeria monocytogenes NOT DETECTED NOT DETECTED   Staphylococcus species DETECTED (A) NOT DETECTED   Staphylococcus aureus (BCID) NOT DETECTED NOT DETECTED   Staphylococcus epidermidis NOT DETECTED NOT DETECTED   Staphylococcus lugdunensis NOT DETECTED NOT DETECTED   Streptococcus species NOT DETECTED NOT DETECTED   Streptococcus agalactiae NOT DETECTED NOT DETECTED   Streptococcus pneumoniae NOT DETECTED NOT DETECTED   Streptococcus pyogenes NOT DETECTED NOT DETECTED   A.calcoaceticus-baumannii NOT DETECTED NOT DETECTED   Bacteroides fragilis NOT DETECTED NOT DETECTED   Enterobacterales NOT DETECTED NOT DETECTED   Enterobacter cloacae complex NOT DETECTED NOT DETECTED   Escherichia coli NOT DETECTED NOT DETECTED   Klebsiella aerogenes NOT DETECTED NOT DETECTED   Klebsiella oxytoca NOT DETECTED NOT DETECTED   Klebsiella pneumoniae NOT DETECTED NOT DETECTED   Proteus species NOT DETECTED NOT DETECTED   Salmonella species NOT DETECTED NOT DETECTED   Serratia marcescens NOT DETECTED NOT DETECTED   Haemophilus influenzae NOT DETECTED NOT DETECTED   Neisseria meningitidis NOT DETECTED NOT DETECTED   Pseudomonas aeruginosa NOT DETECTED NOT DETECTED   Stenotrophomonas maltophilia NOT DETECTED NOT DETECTED    Candida albicans NOT DETECTED NOT DETECTED   Candida auris NOT DETECTED NOT DETECTED   Candida glabrata NOT DETECTED NOT DETECTED   Candida krusei NOT DETECTED NOT DETECTED   Candida parapsilosis NOT DETECTED NOT DETECTED   Candida tropicalis NOT DETECTED NOT DETECTED   Cryptococcus neoformans/gattii NOT DETECTED NOT DETECTED    Einar Grad 05/19/2020  6:51 PM

## 2020-05-19 NOTE — Consult Note (Signed)
[name] for Infectious Disease  Total days of antibiotics 2 vanco/cefepime               Reason for Consult: sacral osteo/wound    Referring Physician: pahwani  Principal Problem:   Sepsis ([place]) Active Problems:   Multiple sclerosis ([place])   UTI (urinary tract infection)   CAP (community acquired pneumonia)   Osteomyelitis ([place])    HPI: Terry Palmer is a 45 y.o. male with quadriplegia, and bilateral upper and lower extremity contractures from severe MS who is also non-verbal s/p peg, who is admitted on 5/12 for fevers, and lethargy. Family member reports increase coughing but also sacral wound worsening. On admit, he had WBC of 19K, SIRS criteria. cxr showing infiltrate Left lower bse. CT of abdomen/pelvis showing signs of osteomyelitis to left ischial tuberosity but has large decub ulcer/tissue defect. General surgery evaluated patient and noted large wound over left ischial tuberosity with significant necrotic tissue and palpable bone (see photo images) who recommended wound hydrotherapy, wet to dry dressing changes with abtx. ID consulted to abtx recs. Infectious work up shows gpc in clusters in one set of blood cx thus far. Identification by BCID is pending. cxr per my read shows LLL infiltrate  Past Medical History:  Diagnosis Date  . Aspiration pneumonia ([place]) 01/20/2013  . Bladder calculi   . Childhood asthma   . 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 Depression   . Dysphagia   . 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 MS (multiple sclerosis) ([place])   . Neurogenic bladder   . Neuromuscular disorder ([place])    Quadraperesis  . Normocytic anemia 05/28/2011  . 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 Quadriplegia and quadriparesis ([place]) 12/24/2014  . Recurrent upper respiratory infection (URI)   . Recurrent UTI   . Seizure disorder ([place])   . Shortness of breath     Allergies: No Known Allergies   MEDICATIONS: . vitamin C  500 mg Per Tube  Daily  . baclofen  20 mg Per Tube QID  . buPROPion  75 mg Per Tube TID  . Chlorhexidine Gluconate Cloth  6 each Topical Daily  . dantrolene  50 mg Per Tube 4 times per day  . feeding supplement (JEVITY 1.5 CAL/FIBER)  840 mL Per Tube Q24H  . folic acid  1 mg Per Tube Daily  . free water  240 mL Per Tube Q4H  . levETIRAcetam  1,000 mg Per Tube TID  . multivitamin with minerals  1 tablet Per Tube Daily  . pantoprazole sodium  40 mg Per Tube Daily  . polyvinyl alcohol  1 drop Both Eyes BID  . vitamin B-12  500 mcg Per Tube Daily    Social History   Tobacco Use  . Smoking status: Former Smoker    Types: Cigarettes    Quit date: 02/02/1999    Years since quitting: 21.3  . Smokeless tobacco: Former Network engineer Use Topics  . Alcohol use: No    Family History  Problem Relation Age of Onset  . Asthma Mother     Review of Systems -  Unable to obtain since he is non verbal  OBJECTIVE: Temp:  [98.1 F (36.7 C)-100 F (37.8 C)] 99.9 F (37.7 C) (05/12 1200) Pulse Rate:  [105-123] 123 (05/12 1200) Resp:  [16-23] 17 (05/12 1200) BP: (115-166)/(75-106) 146/84 (05/12 1200) SpO2:  [94 %-100 %] 96 % (05/12 1200) Weight:  [83.9 kg] 83.9 kg (05/11 2246) Physical  Exam  Constitutional: He is sleeping, arouses with verbal stimuli. He appears chronically ill and mal-nourished. No distress.  HENT: eyes are closed mouth opened Mouth/Throat: Oropharynx is clear and moist. No oropharyngeal exudate.  Cardiovascular: Normal rate, regular rhythm and normal heart sounds. Exam reveals no gallop and no friction rub.  No murmur heard.  Pulmonary/Chest: Effort normal and breath sounds normal. No respiratory distress. He has no wheezes.  Abdominal: Soft. Bowel sounds are normal. He exhibits no distension. There is no tenderness. Peg in place Ext: emaciated. Contracted at knees, elbows, wrists Skin: Skin is warm and dry. Left ischial wound as described above   LABS: Results for orders placed or  performed during the hospital encounter of 05/18/20 (from the past 48 hour(s))  CBG monitoring, ED     Status: Abnormal   Collection Time: 05/18/20  5:51 PM  Result Value Ref Range   Glucose-Capillary 218 (H) 70 - 99 mg/dL    Comment: Glucose reference range applies only to samples taken after fasting for at least 8 hours.  Blood Culture (routine x 2)     Status: None (Preliminary result)   Collection Time: 05/18/20  5:53 PM   Specimen: BLOOD  Result Value Ref Range   Specimen Description BLOOD SITE NOT SPECIFIED    Special Requests      BOTTLES DRAWN AEROBIC AND ANAEROBIC Blood Culture adequate volume   Culture  Setup Time      GRAM POSITIVE COCCI IN CLUSTERS AEROBIC BOTTLE ONLY Organism ID to follow Performed at [facility], [place] [address] [place], [place] [address]    Culture PENDING    Report Status PENDING   Lactic acid, plasma     Status: None   Collection Time: 05/18/20  6:04 PM  Result Value Ref Range   Lactic Acid, Venous 1.4 0.5 - 1.9 mmol/L    Comment: Performed at [facility], [place] [address] [place], [place] [address]  Comprehensive metabolic panel     Status: Abnormal   Collection Time: 05/18/20  6:04 PM  Result Value Ref Range   Sodium 133 (L) 135 - 145 mmol/L   Potassium 4.1 3.5 - 5.1 mmol/L   Chloride 100 98 - 111 mmol/L   CO2 23 22 - 32 mmol/L   Glucose, Bld 218 (H) 70 - 99 mg/dL    Comment: Glucose reference range applies only to samples taken after fasting for at least 8 hours.   BUN 9 6 - 20 mg/dL   Creatinine, Ser 0.40 (L) 0.61 - 1.24 mg/dL   Calcium 9.0 8.9 - 10.3 mg/dL   Total Protein 7.6 6.5 - 8.1 g/dL   Albumin 2.7 (L) 3.5 - 5.0 g/dL   AST 26 15 - 41 U/L   ALT 25 0 - 44 U/L   Alkaline Phosphatase 107 38 - 126 U/L   Total Bilirubin 0.2 (L) 0.3 - 1.2 mg/dL   GFR, Estimated >60 >60 mL/min    Comment: (NOTE) Calculated using the CKD-EPI Creatinine Equation (2021)    Anion gap 10 5 - 15    Comment: Performed at [facility], [place] [address] [place], [place] [address]  CBC WITH DIFFERENTIAL     Status: Abnormal   Collection Time: 05/18/20  6:04 PM  Result Value Ref Range   WBC 19.3 (H) 4.0 - 10.5 K/uL   RBC 4.16 (L) 4.22 - 5.81 MIL/uL   Hemoglobin 12.1 (L) 13.0 - 17.0 g/dL   HCT 37.9 (  L) 39.0 - 52.0 %   MCV 91.1 80.0 - 100.0 fL   MCH 29.1 26.0 - 34.0 pg   MCHC 31.9 30.0 - 36.0 g/dL   RDW 12.8 11.5 - 15.5 %   Platelets 549 (H) 150 - 400 K/uL   nRBC 0.0 0.0 - 0.2 %   Neutrophils Relative % 78 %   Neutro Abs 15.0 (H) 1.7 - 7.7 K/uL   Lymphocytes Relative 13 %   Lymphs Abs 2.5 0.7 - 4.0 K/uL   Monocytes Relative 7 %   Monocytes Absolute 1.4 (H) 0.1 - 1.0 K/uL   Eosinophils Relative 1 %   Eosinophils Absolute 0.3 0.0 - 0.5 K/uL   Basophils Relative 0 %   Basophils Absolute 0.1 0.0 - 0.1 K/uL   Immature Granulocytes 1 %   Abs Immature Granulocytes 0.16 (H) 0.00 - 0.07 K/uL    Comment: Performed at [facility] [address], [place], [place] [address]  Protime-INR     Status: None   Collection Time: 05/18/20  6:04 PM  Result Value Ref Range   Prothrombin Time 14.2 11.4 - 15.2 seconds   INR 1.1 0.8 - 1.2    Comment: (NOTE) INR goal varies based on device and disease states. Performed at [facility], [place] [address], [place], [place] [address]   APTT     Status: Abnormal   Collection Time: 05/18/20  6:04 PM  Result Value Ref Range   aPTT 41 (H) 24 - 36 seconds    Comment:        IF BASELINE aPTT IS ELEVATED, SUGGEST PATIENT RISK ASSESSMENT BE USED TO DETERMINE APPROPRIATE ANTICOAGULANT THERAPY. Performed at [facility], [place] [address] [place], [place] [address]   Lipase, blood     Status: None   Collection Time: 05/18/20  6:04 PM  Result Value Ref Range   Lipase 28 11 - 51 U/L    Comment: Performed at [facility], [place] [address], [place], [place] [address]  I-Stat Chem 8, ED     Status: Abnormal   Collection Time: 05/18/20  6:11 PM  Result Value Ref Range    Sodium 135 135 - 145 mmol/L   Potassium 4.1 3.5 - 5.1 mmol/L   Chloride 100 98 - 111 mmol/L   BUN 9 6 - 20 mg/dL   Creatinine, Ser 0.30 (L) 0.61 - 1.24 mg/dL   Glucose, Bld 222 (H) 70 - 99 mg/dL    Comment: Glucose reference range applies only to samples taken after fasting for at least 8 hours.   Calcium, Ion 1.10 (L) 1.15 - 1.40 mmol/L   TCO2 24 22 - 32 mmol/L   Hemoglobin 12.9 (L) 13.0 - 17.0 g/dL   HCT 38.0 (L) 39.0 - 52.0 %  Urinalysis, Routine w reflex microscopic     Status: Abnormal   Collection Time: 05/18/20  6:24 PM  Result Value Ref Range   Color, Urine YELLOW YELLOW   APPearance CLOUDY (A) CLEAR   Specific Gravity, Urine 1.009 1.005 - 1.030   pH 7.0 5.0 - 8.0   Glucose, UA NEGATIVE NEGATIVE mg/dL   Hgb urine dipstick NEGATIVE NEGATIVE   Bilirubin Urine NEGATIVE NEGATIVE   Ketones, ur NEGATIVE NEGATIVE mg/dL   Protein, ur 30 (A) NEGATIVE mg/dL   Nitrite NEGATIVE NEGATIVE   Leukocytes,Ua LARGE (A) NEGATIVE   RBC / HPF 0-5 0 - 5 RBC/hpf   WBC, UA >50 (H) 0 - 5 WBC/hpf  Bacteria, UA FEW (A) NONE SEEN   Squamous Epithelial / LPF 11-20 0 - 5   WBC Clumps PRESENT    Mucus PRESENT    Amorphous Crystal PRESENT    Ca Oxalate Crys, UA PRESENT    Non Squamous Epithelial 0-5 (A) NONE SEEN    Comment: Performed at [facility], [place] [address] [place], [place] [address]  Blood Culture (routine x 2)     Status: None (Preliminary result)   Collection Time: 05/18/20  6:24 PM   Specimen: BLOOD  Result Value Ref Range   Specimen Description BLOOD SITE NOT SPECIFIED    Special Requests      BOTTLES DRAWN AEROBIC AND ANAEROBIC Blood Culture adequate volume   Culture      NO GROWTH < 24 HOURS Performed at [facility], [place] [address] [place], [place] [address]    Report Status PENDING   Lactic acid, plasma     Status: None   Collection Time: 05/18/20  7:53 PM  Result Value Ref Range   Lactic Acid, Venous 1.3 0.5 - 1.9 mmol/L    Comment: Performed at [facility], [place] [address] [place], [place] [address]  Resp Panel by RT-PCR (Flu A&B, Covid) Nasopharyngeal Swab     Status: None   Collection Time: 05/18/20  8:06 PM   Specimen: Nasopharyngeal Swab; Nasopharyngeal(NP) swabs in vial transport medium  Result Value Ref Range   SARS Coronavirus 2 by RT PCR NEGATIVE NEGATIVE    Comment: (NOTE) SARS-CoV-2 target nucleic acids are NOT DETECTED.  The SARS-CoV-2 RNA is generally detectable in upper respiratory specimens during the acute phase of infection. The lowest concentration of SARS-CoV-2 viral copies this assay can detect is 138 copies/mL. A negative result does not preclude SARS-Cov-2 infection and should not be used as the sole basis for treatment or other patient management decisions. A negative result may occur with  improper specimen collection/handling, submission of specimen other than nasopharyngeal swab, presence of viral mutation(s) within the areas targeted by this assay, and inadequate number of viral copies(<138 copies/mL). A negative result must be combined with clinical observations, patient history, and epidemiological information. The expected result is Negative.  Fact Sheet for Patients:  EntrepreneurPulse.com.au  Fact Sheet for Healthcare Providers:  IncredibleEmployment.be  This test is no t yet approved or cleared by the [place] FDA and  has been authorized for detection and/or diagnosis of SARS-CoV-2 by FDA under an Emergency Use Authorization (EUA). This EUA will remain  in effect (meaning this test can be used) for the duration of the COVID-19 declaration under Section 564(b)(1) of the Act, 21 U.S.C.section 360bbb-3(b)(1), unless the authorization is terminated  or revoked sooner.       Influenza A by PCR NEGATIVE NEGATIVE   Influenza B by PCR NEGATIVE NEGATIVE    Comment: (NOTE) The Xpert Xpress SARS-CoV-2/FLU/RSV plus assay is intended as an aid in the diagnosis  of influenza from Nasopharyngeal swab specimens and should not be used as a sole basis for treatment. Nasal washings and aspirates are unacceptable for Xpert Xpress SARS-CoV-2/FLU/RSV testing.  Fact Sheet for Patients: EntrepreneurPulse.com.au  Fact Sheet for Healthcare Providers: IncredibleEmployment.be  This test is not yet approved or cleared by the [place] FDA and has been authorized for detection and/or diagnosis of SARS-CoV-2 by FDA under an Emergency Use Authorization (EUA). This EUA will remain in effect (meaning this test can be used) for the duration of the COVID-19 declaration under  Section 564(b)(1) of the Act, 21 U.S.C. section 360bbb-3(b)(1), unless the authorization is terminated or revoked.  Performed at [facility], [place] [address], [place], [place] [address]   HIV Antibody (routine testing w rflx)     Status: None   Collection Time: 05/18/20  9:07 PM  Result Value Ref Range   HIV Screen 4th Generation wRfx Non Reactive Non Reactive    Comment: Performed at [facility], [place] [address], [place], [place] [address]  C Difficile Quick Screen w PCR reflex     Status: None   Collection Time: 05/18/20 10:28 PM   Specimen: STOOL  Result Value Ref Range   C Diff antigen NEGATIVE NEGATIVE   C Diff toxin NEGATIVE NEGATIVE   C Diff interpretation No C. difficile detected.     Comment: Performed at [facility], [place] [address], [place], [place] [address]  CBC     Status: Abnormal   Collection Time: 05/19/20  2:01 AM  Result Value Ref Range   WBC 17.1 (H) 4.0 - 10.5 K/uL   RBC 3.93 (L) 4.22 - 5.81 MIL/uL   Hemoglobin 11.6 (L) 13.0 - 17.0 g/dL   HCT 36.2 (L) 39.0 - 52.0 %   MCV 92.1 80.0 - 100.0 fL   MCH 29.5 26.0 - 34.0 pg   MCHC 32.0 30.0 - 36.0 g/dL   RDW 12.7 11.5 - 15.5 %   Platelets 499 (H) 150 - 400 K/uL   nRBC 0.0 0.0 - 0.2 %    Comment: Performed at [facility], [place] [address]  [place], [place] [address]  Basic metabolic panel     Status: Abnormal   Collection Time: 05/19/20  2:01 AM  Result Value Ref Range   Sodium 135 135 - 145 mmol/L   Potassium 3.8 3.5 - 5.1 mmol/L   Chloride 101 98 - 111 mmol/L   CO2 23 22 - 32 mmol/L   Glucose, Bld 140 (H) 70 - 99 mg/dL    Comment: Glucose reference range applies only to samples taken after fasting for at least 8 hours.   BUN 6 6 - 20 mg/dL   Creatinine, Ser <0.30 (L) 0.61 - 1.24 mg/dL   Calcium 8.9 8.9 - 10.3 mg/dL   GFR, Estimated NOT CALCULATED >60 mL/min    Comment: (NOTE) Calculated using the CKD-EPI Creatinine Equation (2021)    Anion gap 11 5 - 15    Comment: Performed at [facility] [address] [place], [place] [address]   No results found for: ESRSEDRATE, POCTSEDRATE  MICRO: reviewed IMAGING: CT ABDOMEN PELVIS WO CONTRAST  Result Date: 05/18/2020 CLINICAL DATA:  Abdominal pain and ischial tuberosity wound, initial encounter EXAM: CT ABDOMEN AND PELVIS WITHOUT CONTRAST TECHNIQUE: Multidetector CT imaging of the abdomen and pelvis was performed following the standard protocol without IV contrast. COMPARISON:  12/08/2017 FINDINGS: Lower chest: Lung bases demonstrate mild scarring bilaterally stable from the prior exam. Hepatobiliary: Liver is within normal limits. Gallbladder is well distended with dependent density consistent with small stones. Pancreas: Unremarkable. No pancreatic ductal dilatation or surrounding inflammatory changes. Spleen: Normal in size without focal abnormality. Adrenals/Urinary Tract: Adrenal glands are within normal limits. Kidneys demonstrate no renal calculi or urinary tract obstructive changes. The ureters are within normal limits. The bladder is decompressed by a suprapubic catheter. Stomach/Bowel: Colon shows no obstructive or inflammatory changes. Mild retained fecal material is seen although no obstructive changes are noted. The appendix is within  normal limits. Small bowel and  stomach are within normal limits with the exception of a gastrostomy catheter. Vascular/Lymphatic: Aortic atherosclerosis. No enlarged abdominal or pelvic lymph nodes. Reproductive: Prostate is unremarkable. Other: No abdominal wall hernia or abnormality. No abdominopelvic ascites. Musculoskeletal: Degenerative changes of lumbar spine are noted with scoliosis concave to the right. Sacrum is within normal limits. There is a soft tissue wound overlying the left ischial tuberosity posteriorly. This is consistent with a decubitus wound. Erosive changes of the underlying ischial tuberosity are seen posteriorly consistent with osteomyelitis. Chronic dislocation of the hips is noted bilaterally. IMPRESSION: Significant progression in soft tissue wound overlying the left ischial tuberosity with underlying bony erosive changes consistent with osteomyelitis. Cholelithiasis without complicating factors. 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 Electronically Signed   By: Inez Catalina M.D.   On: 05/18/2020 19:28   DG Chest Port 1 View  Result Date: 05/18/2020 CLINICAL DATA:  Unresponsive.  Seizure disorder EXAM: PORTABLE CHEST 1 VIEW COMPARISON:  December 07, 2017 FINDINGS: There is ill-defined airspace opacity in the left base. The lungs elsewhere are clear. The heart size and pulmonary vascularity are normal. No adenopathy. No evident bone lesions. IMPRESSION: Ill-defined airspace opacity consistent with pneumonia left base. Lungs otherwise clear. Cardiac silhouette normal. Electronically Signed   By: Lowella Grip III M.D.   On: 05/18/2020 18:34   Assessment/Plan:  45yo M with quadriplegia, severe protein-calorie malnutrition, non-verbal from severe MS with ischial decub ulcer and osteomyelitis admitted for SIRS/sepsis - likely from aspiration pneumonia   - recommend to change abtx to vancomycin plus unasyn - will await to see what blood cx are identifying a true pathogen vs. Contaminant -  pending blood cx will decide length of therapy  Ischial osteomyelitis = can do a 2-wks of iv therapy then convert to oral regimen to give per peg along with wound care  Severe protein calorie malnutrition = continue with tube feeds.

## 2020-05-19 NOTE — ED Notes (Signed)
Contacted wife and made aware of admit status

## 2020-05-19 NOTE — Consult Note (Signed)
[facility] Surgery Consult Note  Terry Palmer 1975-08-08  [id number].    Requesting MD: Darliss Cheney Chief Complaint/Reason for Consult: wound  HPI:  AMIIR WOODING is a 45yo male PMH severe multiple sclerosis causing functional quadriplegia with BUE/BLE contractures, nonverbal at baseline, who was brought into [facility] yesterday via EMS with fevers. No family at bedside therefore the information in this note was taken from the chart. Per wife the patient has been tachycardic, having fevers, developed a cough, and appeared more lethargic for the past 2 days. She was also concerned that his sacral wound was looking worse.   In the ED the patient was afebrile, tachycardic into the 120s and tachypneic. WBC 19.3. lactic acid 1.4. U/a with large leukocytes, few bacteria, negative nitrites; Ucx pending. Bcx NGTD. Chest xray with Ill-defined airspace opacity consistent with pneumonia left base.  CT abdomen/pelvis shows significant progression in soft tissue wound overlying the left ischial tuberosity with underlying bony erosive changes consistent with osteomyelitis. General surgery asked to see.   Review of Systems  Unable to perform ROS: Patient nonverbal   Family History  Problem Relation Age of Onset  . Asthma Mother     Past Medical History:  Diagnosis Date  . Aspiration pneumonia ([place]) 01/20/2013  . Bladder calculi   . Childhood asthma   . 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 Depression   . Dysphagia   . 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 MS (multiple sclerosis) ([place])   . Neurogenic bladder   . Neuromuscular disorder ([place])    Quadraperesis  . Normocytic anemia 05/28/2011  . 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 Quadriplegia and quadriparesis ([place]) 12/24/2014  . Recurrent upper respiratory infection (URI)   . Recurrent UTI   . Seizure disorder ([place])   . Shortness of breath     Past Surgical History:  Procedure Laterality Date  .  GASTROSTOMY  04/16/2011   Procedure: GASTROSTOMY;  Surgeon: Zenovia Jarred, MD;  Location: [place];  Service: General;  Laterality: N/A;  Open G-Tube placement  . LUMBAR PUNCTURE  10/12/2002  . SUPRAPUBIC CYSTOSTOMY      Social History:  reports that he quit smoking about 21 years ago. His smoking use included cigarettes. He has quit using smokeless tobacco. He reports that he does not drink alcohol. No history on file for drug use.  Allergies: No Known Allergies  Medications Prior to Admission  Medication Sig Dispense Refill  . acetaminophen (TYLENOL) 500 MG tablet Take 1 tablet (500 mg total) by mouth every 8 (eight) hours as needed for fever (or pain). (Patient taking differently: Take 500 mg by mouth 2 (two) times daily.) 30 tablet 0  . AZO-CRANBERRY PO Take 1 tablet by mouth 2 (two) times daily.    . baclofen (LIORESAL) 20 MG tablet Place 1 tablet (20 mg total) into feeding tube 4 (four) times daily. 120 each 5  . bisacodyl (DULCOLAX) 10 MG suppository Place 10 mg rectally once as needed for moderate constipation.    Marland Kitchen buPROPion (WELLBUTRIN) 75 MG tablet Place 1 tablet (75 mg total) into feeding tube 3 (three) times daily. 90 tablet 1  . Cobalamin Combinations (OPURITY B12/FOLIC ACID) A999333 MCG TABS Take 0.5 tablets by mouth daily.    . dantrolene (DANTRIUM) 50 MG capsule Give 50 mg by tube 4 (four) times daily. At 0800, 1200, 1600, 2000    . diazepam (VALIUM) 5 MG tablet Place 5 mg into feeding tube at bedtime as needed for  anxiety (sleep).     Marland Kitchen guaifenesin (ROBITUSSIN) 100 MG/5ML syrup Take 200 mg by mouth 3 (three) times daily as needed for cough.    Marland Kitchen HYDROcodone-acetaminophen (NORCO) 7.5-325 MG per tablet Place 1 tablet into feeding tube every 6 (six) hours as needed (pain).     Marland Kitchen ipratropium-albuterol (DUONEB) 0.5-2.5 (3) MG/3ML SOLN Take 3 mLs by nebulization See admin instructions. Four times daily and as needed for shortness of breath    . levETIRAcetam (KEPPRA PO) Take 10 mLs  by mouth 3 (three) times daily.    . Multiple Vitamin (MULTIVITAMIN WITH MINERALS) TABS tablet Place 1 tablet into feeding tube daily. 30 tablet 0  . Nutritional Supplements (FEEDING SUPPLEMENT, JEVITY 1.5 CAL,) LIQD Place 1,000 mLs into feeding tube See admin instructions. 43mL/1hr over 12 hrs    . omeprazole (PRILOSEC) 2 mg/mL SUSP Place 20 mLs (40 mg total) into feeding tube daily. 600 mL 1  . polyethylene glycol (MIRALAX / GLYCOLAX) packet Place 17 g into feeding tube daily. (Patient taking differently: Place 17 g into feeding tube every evening.) 30 each 1  . polyvinyl alcohol (LIQUIFILM TEARS) 1.4 % ophthalmic solution Place 1 drop into both eyes 3 (three) times daily. (Patient taking differently: Place 1 drop into both eyes 2 (two) times daily.) 15 mL 0  . tiZANidine (ZANAFLEX) 2 MG tablet Take 1 tablet (2 mg total) by mouth every 6 (six) hours as needed for muscle spasms. (Patient taking differently: Place 2 mg into feeding tube 3 (three) times daily.) 120 tablet 0  . vitamin C (ASCORBIC ACID) 500 MG tablet Place 500 mg into feeding tube daily.     . Water For Irrigation, Sterile (FREE WATER) SOLN Place 240 mLs into feeding tube every 4 (four) hours. 1000 mL 1  . famotidine (PEPCID) 40 MG/5ML suspension Take 20 mg by mouth daily.      Prior to Admission medications   Medication Sig Start Date End Date Taking? Authorizing Provider  acetaminophen (TYLENOL) 500 MG tablet Take 1 tablet (500 mg total) by mouth every 8 (eight) hours as needed for fever (or pain). Patient taking differently: Take 500 mg by mouth 2 (two) times daily. 03/05/15  Yes Debbe Odea, MD  AZO-CRANBERRY PO Take 1 tablet by mouth 2 (two) times daily.   Yes [provider]  baclofen (LIORESAL) 20 MG tablet Place 1 tablet (20 mg total) into feeding tube 4 (four) times daily. 07/13/15  Yes Kathrynn Ducking, MD  bisacodyl (DULCOLAX) 10 MG suppository Place 10 mg rectally once as needed for moderate constipation.   Yes  [provider]  buPROPion (WELLBUTRIN) 75 MG tablet Place 1 tablet (75 mg total) into feeding tube 3 (three) times daily. 01/24/13  Yes Bonnielee Haff, MD  Cobalamin Combinations (OPURITY B12/FOLIC ACID) 1610-960 MCG TABS Take 0.5 tablets by mouth daily.   Yes [provider]  dantrolene (DANTRIUM) 50 MG capsule Give 50 mg by tube 4 (four) times daily. At 0800, 1200, 1600, 2000   Yes [provider]  diazepam (VALIUM) 5 MG tablet Place 5 mg into feeding tube at bedtime as needed for anxiety (sleep).    Yes [provider]  guaifenesin (ROBITUSSIN) 100 MG/5ML syrup Take 200 mg by mouth 3 (three) times daily as needed for cough.   Yes [provider]  HYDROcodone-acetaminophen (NORCO) 7.5-325 MG per tablet Place 1 tablet into feeding tube every 6 (six) hours as needed (pain).    Yes [provider]  ipratropium-albuterol (  DUONEB) 0.5-2.5 (3) MG/3ML SOLN Take 3 mLs by nebulization See admin instructions. Four times daily and as needed for shortness of breath   Yes [provider]  levETIRAcetam (KEPPRA PO) Take 10 mLs by mouth 3 (three) times daily.   Yes [provider]  Multiple Vitamin (MULTIVITAMIN WITH MINERALS) TABS tablet Place 1 tablet into feeding tube daily. 01/24/13  Yes Bonnielee Haff, MD  Nutritional Supplements (FEEDING SUPPLEMENT, JEVITY 1.5 CAL,) LIQD Place 1,000 mLs into feeding tube See admin instructions. 43mL/1hr over 12 hrs   Yes [provider]  omeprazole (PRILOSEC) 2 mg/mL SUSP Place 20 mLs (40 mg total) into feeding tube daily. 01/24/13  Yes Bonnielee Haff, MD  polyethylene glycol [facility] / GLYCOLAX) packet Place 17 g into feeding tube daily. Patient taking differently: Place 17 g into feeding tube every evening. 01/24/13  Yes Bonnielee Haff, MD  polyvinyl alcohol (LIQUIFILM TEARS) 1.4 % ophthalmic solution Place 1 drop into both eyes 3 (three) times daily. Patient taking differently: Place 1 drop  into both eyes 2 (two) times daily. 01/24/13  Yes Bonnielee Haff, MD  tiZANidine (ZANAFLEX) 2 MG tablet Take 1 tablet (2 mg total) by mouth every 6 (six) hours as needed for muscle spasms. Patient taking differently: Place 2 mg into feeding tube 3 (three) times daily. 11/25/15  Yes Kathrynn Ducking, MD  vitamin C (ASCORBIC ACID) 500 MG tablet Place 500 mg into feeding tube daily.    Yes [provider]  Water For Irrigation, Sterile (FREE WATER) SOLN Place 240 mLs into feeding tube every 4 (four) hours. 01/24/13  Yes Bonnielee Haff, MD  famotidine (PEPCID) 40 MG/5ML suspension Take 20 mg by mouth daily. 04/28/20   [provider]    Blood pressure (!) 158/98, pulse (!) 120, temperature 98.8 F (37.1 C), temperature source Axillary, resp. rate 18, height 6\' 4"  (1.93 m), weight 83.9 kg, SpO2 97 %. Physical Exam: General: chronically ill appearing male who is laying in bed in NAD HEENT: head is normocephalic, atraumatic.  Sclera are noninjected.  Pupils equal and round.  Ears and nose without any masses or lesions.  Mouth is dry. Dentition fair Heart: tachy. Feet WWP bilaterally Lungs: rate and effort normal. No audible wheezing or rhonchi Abd: soft, NT/ND, +BS, no masses, hernias, or organomegaly. G tube in place MS: severe BUE/BLE contractures, calves soft and nontender without edema Skin: warm and dry with no masses, lesions, or rashes Psych: unable to assess Neuro: nonverbal, not responding, snoring GU: large open wound over left ischial tuberosity with palpable bone, significant necrotic tissue but no purulent drainage or cellulitis noted     Results for orders placed or performed during the hospital encounter of 05/18/20 (from the past 48 hour(s))  CBG monitoring, ED     Status: Abnormal   Collection Time: 05/18/20  5:51 PM  Result Value Ref Range   Glucose-Capillary 218 (H) 70 - 99 mg/dL    Comment: Glucose reference range applies only to samples taken after  fasting for at least 8 hours.  Blood Culture (routine x 2)     Status: None (Preliminary result)   Collection Time: 05/18/20  5:53 PM   Specimen: BLOOD  Result Value Ref Range   Specimen Description BLOOD SITE NOT SPECIFIED    Special Requests      BOTTLES DRAWN AEROBIC AND ANAEROBIC Blood Culture adequate volume   Culture      NO GROWTH < 24 HOURS Performed at [facility], [address] [place], [place] [address]    Report Status PENDING   Lactic acid, plasma     Status: None   Collection Time: 05/18/20  6:04 PM  Result Value Ref Range   Lactic Acid, Venous 1.4 0.5 - 1.9 mmol/L    Comment: Performed at [facility], [place] [address] [place], [place] [address]  Comprehensive metabolic panel     Status: Abnormal   Collection Time: 05/18/20  6:04 PM  Result Value Ref Range   Sodium 133 (L) 135 - 145 mmol/L   Potassium 4.1 3.5 - 5.1 mmol/L   Chloride 100 98 - 111 mmol/L   CO2 23 22 - 32 mmol/L   Glucose, Bld 218 (H) 70 - 99 mg/dL    Comment: Glucose reference range applies only to samples taken after fasting for at least 8 hours.   BUN 9 6 - 20 mg/dL   Creatinine, Ser 0.40 (L) 0.61 - 1.24 mg/dL   Calcium 9.0 8.9 - 10.3 mg/dL   Total Protein 7.6 6.5 - 8.1 g/dL   Albumin 2.7 (L) 3.5 - 5.0 g/dL   AST 26 15 - 41 U/L   ALT 25 0 - 44 U/L   Alkaline Phosphatase 107 38 - 126 U/L   Total Bilirubin 0.2 (L) 0.3 - 1.2 mg/dL   GFR, Estimated >60 >60 mL/min    Comment: (NOTE) Calculated using the CKD-EPI Creatinine Equation (2021)    Anion gap 10 5 - 15    Comment: Performed at [facility], [place] [address] [place], [place] [address]  CBC WITH DIFFERENTIAL     Status: Abnormal   Collection Time: 05/18/20  6:04 PM  Result Value Ref Range   WBC 19.3 (H) 4.0 - 10.5 K/uL   RBC 4.16 (L) 4.22 - 5.81 MIL/uL   Hemoglobin 12.1 (L) 13.0 - 17.0 g/dL   HCT 37.9 (L) 39.0 - 52.0 %   MCV 91.1 80.0 - 100.0 fL   MCH 29.1 26.0 - 34.0 pg   MCHC 31.9 30.0 - 36.0 g/dL   RDW 12.8  11.5 - 15.5 %   Platelets 549 (H) 150 - 400 K/uL   nRBC 0.0 0.0 - 0.2 %   Neutrophils Relative % 78 %   Neutro Abs 15.0 (H) 1.7 - 7.7 K/uL   Lymphocytes Relative 13 %   Lymphs Abs 2.5 0.7 - 4.0 K/uL   Monocytes Relative 7 %   Monocytes Absolute 1.4 (H) 0.1 - 1.0 K/uL   Eosinophils Relative 1 %   Eosinophils Absolute 0.3 0.0 - 0.5 K/uL   Basophils Relative 0 %   Basophils Absolute 0.1 0.0 - 0.1 K/uL   Immature Granulocytes 1 %   Abs Immature Granulocytes 0.16 (H) 0.00 - 0.07 K/uL    Comment: Performed at [facility], [address], [place],  [address]  Protime-INR     Status: None   Collection Time: 05/18/20  6:04 PM  Result Value Ref Range   Prothrombin Time 14.2 11.4 - 15.2 seconds   INR 1.1 0.8 - 1.2    Comment: (NOTE) INR goal varies based on device and disease states. Performed at [facility], [place] [address] [place],  [address]   APTT     Status: Abnormal   Collection Time: 05/18/20  6:04 PM  Result Value Ref Range   aPTT 41 (H) 24 - 36 seconds    Comment:        IF  BASELINE aPTT IS ELEVATED, SUGGEST PATIENT RISK ASSESSMENT BE USED TO DETERMINE APPROPRIATE ANTICOAGULANT THERAPY. Performed at [facility], [place] [address] [place], [place] [address]   Lipase, blood     Status: None   Collection Time: 05/18/20  6:04 PM  Result Value Ref Range   Lipase 28 11 - 51 U/L    Comment: Performed at [facility], [place] [address], [place], [place] [address]  I-Stat Chem 8, ED     Status: Abnormal   Collection Time: 05/18/20  6:11 PM  Result Value Ref Range   Sodium 135 135 - 145 mmol/L   Potassium 4.1 3.5 - 5.1 mmol/L   Chloride 100 98 - 111 mmol/L   BUN 9 6 - 20 mg/dL   Creatinine, Ser 0.30 (L) 0.61 - 1.24 mg/dL   Glucose, Bld 222 (H) 70 - 99 mg/dL    Comment: Glucose reference range applies only to samples taken after fasting for at least 8 hours.   Calcium, Ion 1.10 (L) 1.15 - 1.40 mmol/L   TCO2 24 22 - 32 mmol/L   Hemoglobin  12.9 (L) 13.0 - 17.0 g/dL   HCT 38.0 (L) 39.0 - 52.0 %  Urinalysis, Routine w reflex microscopic     Status: Abnormal   Collection Time: 05/18/20  6:24 PM  Result Value Ref Range   Color, Urine YELLOW YELLOW   APPearance CLOUDY (A) CLEAR   Specific Gravity, Urine 1.009 1.005 - 1.030   pH 7.0 5.0 - 8.0   Glucose, UA NEGATIVE NEGATIVE mg/dL   Hgb urine dipstick NEGATIVE NEGATIVE   Bilirubin Urine NEGATIVE NEGATIVE   Ketones, ur NEGATIVE NEGATIVE mg/dL   Protein, ur 30 (A) NEGATIVE mg/dL   Nitrite NEGATIVE NEGATIVE   Leukocytes,Ua LARGE (A) NEGATIVE   RBC / HPF 0-5 0 - 5 RBC/hpf   WBC, UA >50 (H) 0 - 5 WBC/hpf   Bacteria, UA FEW (A) NONE SEEN   Squamous Epithelial / LPF 11-20 0 - 5   WBC Clumps PRESENT    Mucus PRESENT    Amorphous Crystal PRESENT    Ca Oxalate Crys, UA PRESENT    Non Squamous Epithelial 0-5 (A) NONE SEEN    Comment: Performed at [facility], [address] [place], [place] [address]  Blood Culture (routine x 2)     Status: None (Preliminary result)   Collection Time: 05/18/20  6:24 PM   Specimen: BLOOD  Result Value Ref Range   Specimen Description BLOOD SITE NOT SPECIFIED    Special Requests      BOTTLES DRAWN AEROBIC AND ANAEROBIC Blood Culture adequate volume   Culture      NO GROWTH < 12 HOURS Performed at [facility], [place] [address], [place], [place] [address]    Report Status PENDING   Lactic acid, plasma     Status: None   Collection Time: 05/18/20  7:53 PM  Result Value Ref Range   Lactic Acid, Venous 1.3 0.5 - 1.9 mmol/L    Comment: Performed at [facility], [place] [address], [place], [place] [address]  Resp Panel by RT-PCR (Flu A&B, Covid) Nasopharyngeal Swab     Status: None   Collection Time: 05/18/20  8:06 PM   Specimen: Nasopharyngeal Swab; Nasopharyngeal(NP) swabs in vial transport medium  Result Value Ref Range   SARS Coronavirus 2 by RT PCR NEGATIVE NEGATIVE    Comment: (NOTE) SARS-CoV-2 target nucleic acids are NOT  DETECTED.  The SARS-CoV-2 RNA is generally detectable in upper respiratory specimens during the acute phase of infection. The lowest concentration of SARS-CoV-2 viral copies this assay can detect is 138 copies/mL. A negative result does not preclude SARS-Cov-2 infection and should not be used as the sole basis for treatment or other patient management decisions. A negative result may occur with  improper specimen collection/handling, submission of specimen other than nasopharyngeal swab, presence of viral mutation(s) within the areas targeted by this assay, and inadequate number of viral copies(<138 copies/mL). A negative result must be combined with clinical observations, patient history, and epidemiological information. The expected result is Negative.  Fact Sheet for Patients:  EntrepreneurPulse.com.au  Fact Sheet for Healthcare Providers:  IncredibleEmployment.be  This test is no t yet approved or cleared by the [place] FDA and  has been authorized for detection and/or diagnosis of SARS-CoV-2 by FDA under an Emergency Use Authorization (EUA). This EUA will remain  in effect (meaning this test can be used) for the duration of the COVID-19 declaration under Section 564(b)(1) of the Act, 21 U.S.C.section 360bbb-3(b)(1), unless the authorization is terminated  or revoked sooner.       Influenza A by PCR NEGATIVE NEGATIVE   Influenza B by PCR NEGATIVE NEGATIVE    Comment: (NOTE) The Xpert Xpress SARS-CoV-2/FLU/RSV plus assay is intended as an aid in the diagnosis of influenza from Nasopharyngeal swab specimens and should not be used as a sole basis for treatment. Nasal washings and aspirates are unacceptable for Xpert Xpress SARS-CoV-2/FLU/RSV testing.  Fact Sheet for Patients: EntrepreneurPulse.com.au  Fact Sheet for Healthcare Providers: IncredibleEmployment.be  This test is not yet approved or  cleared by the [place] FDA and has been authorized for detection and/or diagnosis of SARS-CoV-2 by FDA under an Emergency Use Authorization (EUA). This EUA will remain in effect (meaning this test can be used) for the duration of the COVID-19 declaration under Section 564(b)(1) of the Act, 21 U.S.C. section 360bbb-3(b)(1), unless the authorization is terminated or revoked.  Performed at [facility], [place] [address], [place], [place] [address]   C Difficile Quick Screen w PCR reflex     Status: None   Collection Time: 05/18/20 10:28 PM   Specimen: STOOL  Result Value Ref Range   C Diff antigen NEGATIVE NEGATIVE   C Diff toxin NEGATIVE NEGATIVE   C Diff interpretation No C. difficile detected.     Comment: Performed at [facility], [place] [address] [place], [place] [address]  CBC     Status: Abnormal   Collection Time: 05/19/20  2:01 AM  Result Value Ref Range   WBC 17.1 (H) 4.0 - 10.5 K/uL   RBC 3.93 (L) 4.22 - 5.81 MIL/uL   Hemoglobin 11.6 (L) 13.0 - 17.0 g/dL   HCT 36.2 (L) 39.0 - 52.0 %   MCV 92.1 80.0 - 100.0 fL   MCH 29.5 26.0 - 34.0 pg   MCHC 32.0 30.0 - 36.0 g/dL   RDW 12.7 11.5 - 15.5 %   Platelets 499 (H) 150 - 400 K/uL   nRBC 0.0 0.0 - 0.2 %    Comment: Performed at [facility], [place] [address] [place], [place] [id number]  Basic metabolic panel     Status: Abnormal   Collection Time: 05/19/20  2:01 AM  Result Value Ref Range   Sodium 135 135 - 145 mmol/L   Potassium 3.8 3.5 - 5.1 mmol/L   Chloride 101 98 - 111 mmol/L  CO2 23 22 - 32 mmol/L   Glucose, Bld 140 (H) 70 - 99 mg/dL    Comment: Glucose reference range applies only to samples taken after fasting for at least 8 hours.   BUN 6 6 - 20 mg/dL   Creatinine, Ser <0.30 (L) 0.61 - 1.24 mg/dL   Calcium 8.9 8.9 - 10.3 mg/dL   GFR, Estimated NOT CALCULATED >60 mL/min    Comment: (NOTE) Calculated using the CKD-EPI Creatinine Equation (2021)    Anion gap 11 5 - 15    Comment: Performed  at [facility] [address] [place], [place] [address]   CT ABDOMEN PELVIS WO CONTRAST  Result Date: 05/18/2020 CLINICAL DATA:  Abdominal pain and ischial tuberosity wound, initial encounter EXAM: CT ABDOMEN AND PELVIS WITHOUT CONTRAST TECHNIQUE: Multidetector CT imaging of the abdomen and pelvis was performed following the standard protocol without IV contrast. COMPARISON:  12/08/2017 FINDINGS: Lower chest: Lung bases demonstrate mild scarring bilaterally stable from the prior exam. Hepatobiliary: Liver is within normal limits. Gallbladder is well distended with dependent density consistent with small stones. Pancreas: Unremarkable. No pancreatic ductal dilatation or surrounding inflammatory changes. Spleen: Normal in size without focal abnormality. Adrenals/Urinary Tract: Adrenal glands are within normal limits. Kidneys demonstrate no renal calculi or urinary tract obstructive changes. The ureters are within normal limits. The bladder is decompressed by a suprapubic catheter. Stomach/Bowel: Colon shows no obstructive or inflammatory changes. Mild retained fecal material is seen although no obstructive changes are noted. The appendix is within normal limits. Small bowel and stomach are within normal limits with the exception of a gastrostomy catheter. Vascular/Lymphatic: Aortic atherosclerosis. No enlarged abdominal or pelvic lymph nodes. Reproductive: Prostate is unremarkable. Other: No abdominal wall hernia or abnormality. No abdominopelvic ascites. Musculoskeletal: Degenerative changes of lumbar spine are noted with scoliosis concave to the right. Sacrum is within normal limits. There is a soft tissue wound overlying the left ischial tuberosity posteriorly. This is consistent with a decubitus wound. Erosive changes of the underlying ischial tuberosity are seen posteriorly consistent with osteomyelitis. Chronic dislocation of the hips is noted bilaterally. IMPRESSION: Significant progression in  soft tissue wound overlying the left ischial tuberosity with underlying bony erosive changes consistent with osteomyelitis. Cholelithiasis without complicating factors. 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 Electronically Signed   By: Inez Catalina M.D.   On: 05/18/2020 19:28   DG Chest Port 1 View  Result Date: 05/18/2020 CLINICAL DATA:  Unresponsive.  Seizure disorder EXAM: PORTABLE CHEST 1 VIEW COMPARISON:  December 07, 2017 FINDINGS: There is ill-defined airspace opacity in the left base. The lungs elsewhere are clear. The heart size and pulmonary vascularity are normal. No adenopathy. No evident bone lesions. IMPRESSION: Ill-defined airspace opacity consistent with pneumonia left base. Lungs otherwise clear. Cardiac silhouette normal. Electronically Signed   By: Lowella Grip III M.D.   On: 05/18/2020 18:34   Anti-infectives (From admission, onward)   Start     Dose/Rate Route Frequency Ordered Stop   05/19/20 2000  azithromycin (ZITHROMAX) 500 mg in sodium chloride 0.9 % 250 mL IVPB        500 mg 250 mL/hr over 60 Minutes Intravenous Every 24 hours 05/18/20 2114     05/19/20 0800  vancomycin (VANCOREADY) IVPB 750 mg/150 mL        750 mg 150 mL/hr over 60 Minutes Intravenous Every 12 hours 05/18/20 2056     05/19/20 0400  ceFEPIme (MAXIPIME) 2 g in sodium chloride 0.9 %  100 mL IVPB        2 g 200 mL/hr over 30 Minutes Intravenous Every 8 hours 05/18/20 2056     05/18/20 1845  vancomycin (VANCOREADY) IVPB 1500 mg/300 mL        1,500 mg 150 mL/hr over 120 Minutes Intravenous  Once 05/18/20 1844 05/18/20 2219   05/18/20 1845  ceFEPIme (MAXIPIME) 2 g in sodium chloride 0.9 % 100 mL IVPB        2 g 200 mL/hr over 30 Minutes Intravenous  Once 05/18/20 1844 05/18/20 2002   05/18/20 1845  azithromycin (ZITHROMAX) 500 mg in sodium chloride 0.9 % 250 mL IVPB        500 mg 250 mL/hr over 60 Minutes Intravenous  Once 05/18/20 1844 05/18/20 2142         Assessment/Plan Advanced underlying multiple sclerosis Functional quadriplegia, BUE/BLE contractures Dysphagia with chronic indwelling PEG Neurogenic bladder with chronic suprapubic catheter Seizure disorder Sepsis, CAP, possible catheter associated UTI   Left ischial wound - Wound with significant necrotic tissue and exposed bone, but no cellulitis or purulent drainage noted on exam. CT scan shows significant progression in soft tissue wound overlying the left ischial tuberosity with underlying bony erosive changes consistent with osteomyelitis, no definite abscess. Suspect that his sepsis is stemming more from CAP and possible UTI rather than wound. At this time would recommend local wound care and no surgical debridement. Will ask PT to see for hydrotherapy. BID wet to dry dressing changes. Air mattress ordered. Patient needs frequent repositioning to decrease pressure on wound. Consider ID consult for osteomyelitis.    ID - maxipime/ vancomycin/ azithromycin 5/11>> VTE - per primary, ok for chemical DVT prophylaxis from surgical standpoint FEN - NPO, TF  Wellington Hampshire, [facility] Surgery 05/19/2020, 9:33 AM Please see Amion for pager number during day hours 7:00am-4:30pm

## 2020-05-19 NOTE — ED Notes (Signed)
Charge nurse made aware 

## 2020-05-19 NOTE — Progress Notes (Signed)
   05/19/20 2200  Assess: MEWS Score  Temp (!) 100.8 F (38.2 C)  BP 131/81  Pulse Rate (!) 114  Resp 18  SpO2 91 %  O2 Device Room Air  Assess: MEWS Score  MEWS Temp 1  MEWS Systolic 0  MEWS Pulse 2  MEWS RR 0  MEWS LOC 2  MEWS Score 5  MEWS Score Color Red  Assess: if the MEWS score is Yellow or Red  Were vital signs taken at a resting state? Yes  Focused Assessment No change from prior assessment  Early Detection of Sepsis Score *See Row Information* Medium  MEWS guidelines implemented *See Row Information* No, previously red, continue vital signs every 4 hours  Treat  MEWS Interventions Other (Comment)  Pain Scale Faces  Faces Pain Scale 0  Document  Patient Outcome Other (Comment) (monitor)  Progress note created (see row info) Yes

## 2020-05-19 NOTE — Progress Notes (Signed)
PROGRESS NOTE    Terry Palmer  KKX:[id number] DOB: 1975-06-15 DOA: 05/18/2020 PCP: [name], MD   Brief Narrative:  HPI: Terry Palmer is a 45 y.o. male with medical history significant of advanced multiple sclerosis, nonverbal at baseline, quadriplegic with contractures, neurogenic bladder with chronic suprapubic catheter, dysphagia with chronic indwelling PEG tube, dementia, history of seizures presented to the ED via EMS for evaluation of fevers and decubitus ulcer getting worse.  History provided by wife at bedside who reports that the patient has been tachycardic and having fevers for the past 2 days.  He has been receiving Tylenol at home.  Yesterday his heart rate was up to the 150s.  This morning his temperature was 101.2 F.  He has appeared more lethargic than his baseline.  Wife states he has a chronic sacral wound which has been worsening for since March with more drainage.  States patient is not vaccinated against COVID and has been coughing.  A week ago he had a chest x-ray done and was diagnosed with pneumonia.  He was started on amoxicillin and since then is having diarrhea.  No vomiting.  Wife states patient has a history of recurrent UTIs.  In the ED, afebrile. Tachycardic with heart rate up to 120s and slightly tachypneic.  Not hypotensive.  Not hypoxic.  Labs showing WBC 19.3, hemoglobin 12.1 (stable compared to prior labs), platelet count 549K.  Sodium 133, potassium 4.1, chloride 100, bicarb 23, BUN 9, creatinine 0.4, glucose 218.  Initial lactic acid 1.4, repeat pending.  INR 1.1.  Blood culture x2 pending.  UA with negative nitrite, large amount of leukocytes, greater than 50 WBCs, and few bacteria.  Urine culture pending.  COVID and influenza panel pending.  Chest x-ray showing left lung base opacity suspicious for pneumonia.  Patient noted to have a large sacral wound.  CT showing significant progression and soft tissue wound overlying the left ischial tuberosity  with underlying bony erosive changes consistent with osteomyelitis. Patient was given vancomycin, cefepime, and azithromycin.  Assessment & Plan:   Principal Problem:   Sepsis ([place]) Active Problems:   Multiple sclerosis ([place])   UTI (urinary tract infection)   CAP (community acquired pneumonia)   Osteomyelitis ([place])  Sepsis Secondary tolarge sacral wound with signs of osteomyelitis/aspiration pneumonia: CT showing significant progression and soft tissue wound overlying the left ischial tuberosity with underlying bony erosive changes consistent with osteomyelitis. Blood culture x2 pending.  General surgery consulted.  They did not recommend any intervention.  They have ordered hydrotherapy through PT.  They recommend ID consultation.  I have consulted Dr. Graylon Good.  Continue cefepime, azithromycin and vancomycin.  Not hypoxic, patient on room air.  Continue IV fluids.  I personally doubt catheter associated UTI.  This is likely contamination due to indwelling suprapubic catheter.  Diarrhea: Likely related to recent antibiotic use.  He is also receiving Dulcolax and MiraLAX at home.  Abdominal exam benign.  C. difficile negative.  GI pathogen panel pending.  Advanced underlying MS causing functional quadriplegia, dysphagia with chronic indwelling PEG, neurogenic bladder with chronic suprapubic catheter -Continue supportive care  Seizure disorder -Continue Keppra  DVT prophylaxis: SCDs Start: 05/18/20 2108   Code Status: Full Code  Family Communication: None present at bedside.  Plan of care discussed with his wife over the phone.  She reiterated that she would like for him to be full code.  Status is: Inpatient  Remains inpatient appropriate because:Inpatient level of care appropriate due to severity  of illness   Dispo: The patient is from: Home              Anticipated d/c is to: Home              Patient currently is not medically stable to d/c.   Difficult to place patient  No        Estimated body mass index is 22.52 kg/m as calculated from the following:   Height as of this encounter: 6\' 4"  (1.93 m).   Weight as of this encounter: 83.9 kg.      Nutritional status:               Consultants:   ID  General surgery  Procedures:   None  Antimicrobials:  Anti-infectives (From admission, onward)   Start     Dose/Rate Route Frequency Ordered Stop   05/19/20 2000  azithromycin (ZITHROMAX) 500 mg in sodium chloride 0.9 % 250 mL IVPB        500 mg 250 mL/hr over 60 Minutes Intravenous Every 24 hours 05/18/20 2114     05/19/20 0800  vancomycin (VANCOREADY) IVPB 750 mg/150 mL        750 mg 150 mL/hr over 60 Minutes Intravenous Every 12 hours 05/18/20 2056     05/19/20 0400  ceFEPIme (MAXIPIME) 2 g in sodium chloride 0.9 % 100 mL IVPB        2 g 200 mL/hr over 30 Minutes Intravenous Every 8 hours 05/18/20 2056     05/18/20 1845  vancomycin (VANCOREADY) IVPB 1500 mg/300 mL        1,500 mg 150 mL/hr over 120 Minutes Intravenous  Once 05/18/20 1844 05/18/20 2219   05/18/20 1845  ceFEPIme (MAXIPIME) 2 g in sodium chloride 0.9 % 100 mL IVPB        2 g 200 mL/hr over 30 Minutes Intravenous  Once 05/18/20 1844 05/18/20 2002   05/18/20 1845  azithromycin (ZITHROMAX) 500 mg in sodium chloride 0.9 % 250 mL IVPB        500 mg 250 mL/hr over 60 Minutes Intravenous  Once 05/18/20 1844 05/18/20 2142         Subjective: Patient seen and examined.  Patient nonverbal.  He was slightly tachypneic and tachycardic on examination.  Lethargic as well.  Objective: Vitals:   05/19/20 0423 05/19/20 0625 05/19/20 0929 05/19/20 1200  BP: (!) 154/101 (!) 153/93 (!) 158/98 (!) 146/84  Pulse: (!) 113 (!) 117 (!) 120 (!) 123  Resp: 16 18 18 17   Temp: 98.9 F (37.2 C) 99.6 F (37.6 C) 98.8 F (37.1 C) 99.9 F (37.7 C)  TempSrc: Axillary Oral Axillary Axillary  SpO2: 96% 98% 97% 96%  Weight:      Height:        Intake/Output Summary (Last 24  hours) at 05/19/2020 1412 Last data filed at 05/19/2020 1242 Gross per 24 hour  Intake 6197.95 ml  Output 2800 ml  Net 3397.95 ml   Filed Weights   05/18/20 2157 05/18/20 2246  Weight: 83.9 kg 83.9 kg    Examination:  General exam: Appears slightly tachycardic and tachypneic and slightly lethargic Respiratory system: Clear to auscultation. Respiratory effort normal. Cardiovascular system: S1 & S2 heard, RRR. No JVD, murmurs, rubs, gallops or clicks. No pedal edema. Gastrointestinal system: Abdomen is nondistended, soft and nontender. No organomegaly or masses felt. Normal bowel sounds heard.  PEG tube in place. Central nervous system: Lethargic with contractures in all 4 extremities.  Does not follow any commands at baseline. Skin: Large sacral decubitus ulcer.  Data Reviewed: I have personally reviewed following labs and imaging studies  CBC: Recent Labs  Lab 05/18/20 1804 05/18/20 1811 05/19/20 0201  WBC 19.3*  --  17.1*  NEUTROABS 15.0*  --   --   HGB 12.1* 12.9* 11.6*  HCT 37.9* 38.0* 36.2*  MCV 91.1  --  92.1  PLT 549*  --  99991111*   Basic Metabolic Panel: Recent Labs  Lab 05/18/20 1804 05/18/20 1811 05/19/20 0201  NA 133* 135 135  K 4.1 4.1 3.8  CL 100 100 101  CO2 23  --  23  GLUCOSE 218* 222* 140*  BUN 9 9 6   CREATININE 0.40* 0.30* <0.30*  CALCIUM 9.0  --  8.9   GFR: CrCl cannot be calculated (This lab value cannot be used to calculate CrCl because it is not a number: <0.30). Liver Function Tests: Recent Labs  Lab 05/18/20 1804  AST 26  ALT 25  ALKPHOS 107  BILITOT 0.2*  PROT 7.6  ALBUMIN 2.7*   Recent Labs  Lab 05/18/20 1804  LIPASE 28   No results for input(s): AMMONIA in the last 168 hours. Coagulation Profile: Recent Labs  Lab 05/18/20 1804  INR 1.1   Cardiac Enzymes: No results for input(s): CKTOTAL, CKMB, CKMBINDEX, TROPONINI in the last 168 hours. BNP (last 3 results) No results for input(s): PROBNP in the last 8760  hours. HbA1C: No results for input(s): HGBA1C in the last 72 hours. CBG: Recent Labs  Lab 05/18/20 1751  GLUCAP 218*   Lipid Profile: No results for input(s): CHOL, HDL, LDLCALC, TRIG, CHOLHDL, LDLDIRECT in the last 72 hours. Thyroid Function Tests: No results for input(s): TSH, T4TOTAL, FREET4, T3FREE, THYROIDAB in the last 72 hours. Anemia Panel: No results for input(s): VITAMINB12, FOLATE, FERRITIN, TIBC, IRON, RETICCTPCT in the last 72 hours. Sepsis Labs: Recent Labs  Lab 05/18/20 1804 05/18/20 1953  LATICACIDVEN 1.4 1.3    Recent Results (from the past 240 hour(s))  Blood Culture (routine x 2)     Status: None (Preliminary result)   Collection Time: 05/18/20  5:53 PM   Specimen: BLOOD  Result Value Ref Range Status   Specimen Description BLOOD SITE NOT SPECIFIED  Final   Special Requests   Final    BOTTLES DRAWN AEROBIC AND ANAEROBIC Blood Culture adequate volume   Culture   Final    NO GROWTH < 24 HOURS Performed at [facility], [place] [address], [place], [place] [address]    Report Status PENDING  Incomplete  Blood Culture (routine x 2)     Status: None (Preliminary result)   Collection Time: 05/18/20  6:24 PM   Specimen: BLOOD  Result Value Ref Range Status   Specimen Description BLOOD SITE NOT SPECIFIED  Final   Special Requests   Final    BOTTLES DRAWN AEROBIC AND ANAEROBIC Blood Culture adequate volume   Culture   Final    NO GROWTH < 12 HOURS Performed at [facility], [place] [address], [place], [place] [address]    Report Status PENDING  Incomplete  Resp Panel by RT-PCR (Flu A&B, Covid) Nasopharyngeal Swab     Status: None   Collection Time: 05/18/20  8:06 PM   Specimen: Nasopharyngeal Swab; Nasopharyngeal(NP) swabs in vial transport medium  Result Value Ref Range Status   SARS Coronavirus 2 by RT PCR NEGATIVE NEGATIVE Final    Comment: (NOTE) SARS-CoV-2 target  nucleic acids are NOT DETECTED.  The SARS-CoV-2 RNA is generally detectable in  upper respiratory specimens during the acute phase of infection. The lowest concentration of SARS-CoV-2 viral copies this assay can detect is 138 copies/mL. A negative result does not preclude SARS-Cov-2 infection and should not be used as the sole basis for treatment or other patient management decisions. A negative result may occur with  improper specimen collection/handling, submission of specimen other than nasopharyngeal swab, presence of viral mutation(s) within the areas targeted by this assay, and inadequate number of viral copies(<138 copies/mL). A negative result must be combined with clinical observations, patient history, and epidemiological information. The expected result is Negative.  Fact Sheet for Patients:  EntrepreneurPulse.com.au  Fact Sheet for Healthcare Providers:  IncredibleEmployment.be  This test is no t yet approved or cleared by the [place] FDA and  has been authorized for detection and/or diagnosis of SARS-CoV-2 by FDA under an Emergency Use Authorization (EUA). This EUA will remain  in effect (meaning this test can be used) for the duration of the COVID-19 declaration under Section 564(b)(1) of the Act, 21 U.S.C.section 360bbb-3(b)(1), unless the authorization is terminated  or revoked sooner.       Influenza A by PCR NEGATIVE NEGATIVE Final   Influenza B by PCR NEGATIVE NEGATIVE Final    Comment: (NOTE) The Xpert Xpress SARS-CoV-2/FLU/RSV plus assay is intended as an aid in the diagnosis of influenza from Nasopharyngeal swab specimens and should not be used as a sole basis for treatment. Nasal washings and aspirates are unacceptable for Xpert Xpress SARS-CoV-2/FLU/RSV testing.  Fact Sheet for Patients: EntrepreneurPulse.com.au  Fact Sheet for Healthcare Providers: IncredibleEmployment.be  This test is not yet approved or cleared by the [place] FDA and has been  authorized for detection and/or diagnosis of SARS-CoV-2 by FDA under an Emergency Use Authorization (EUA). This EUA will remain in effect (meaning this test can be used) for the duration of the COVID-19 declaration under Section 564(b)(1) of the Act, 21 U.S.C. section 360bbb-3(b)(1), unless the authorization is terminated or revoked.  Performed at [facility], [place] [address], [place], [place] [address]   C Difficile Quick Screen w PCR reflex     Status: None   Collection Time: 05/18/20 10:28 PM   Specimen: STOOL  Result Value Ref Range Status   C Diff antigen NEGATIVE NEGATIVE Final   C Diff toxin NEGATIVE NEGATIVE Final   C Diff interpretation No C. difficile detected.  Final    Comment: Performed at [facility], [place] [address] [place], [place] [address]      Radiology Studies: CT ABDOMEN PELVIS WO CONTRAST  Result Date: 05/18/2020 CLINICAL DATA:  Abdominal pain and ischial tuberosity wound, initial encounter EXAM: CT ABDOMEN AND PELVIS WITHOUT CONTRAST TECHNIQUE: Multidetector CT imaging of the abdomen and pelvis was performed following the standard protocol without IV contrast. COMPARISON:  12/08/2017 FINDINGS: Lower chest: Lung bases demonstrate mild scarring bilaterally stable from the prior exam. Hepatobiliary: Liver is within normal limits. Gallbladder is well distended with dependent density consistent with small stones. Pancreas: Unremarkable. No pancreatic ductal dilatation or surrounding inflammatory changes. Spleen: Normal in size without focal abnormality. Adrenals/Urinary Tract: Adrenal glands are within normal limits. Kidneys demonstrate no renal calculi or urinary tract obstructive changes. The ureters are within normal limits. The bladder is decompressed by a suprapubic catheter. Stomach/Bowel: Colon shows no obstructive or inflammatory changes. Mild retained fecal material is seen although no obstructive changes are noted. The appendix  is within normal limits.  Small bowel and stomach are within normal limits with the exception of a gastrostomy catheter. Vascular/Lymphatic: Aortic atherosclerosis. No enlarged abdominal or pelvic lymph nodes. Reproductive: Prostate is unremarkable. Other: No abdominal wall hernia or abnormality. No abdominopelvic ascites. Musculoskeletal: Degenerative changes of lumbar spine are noted with scoliosis concave to the right. Sacrum is within normal limits. There is a soft tissue wound overlying the left ischial tuberosity posteriorly. This is consistent with a decubitus wound. Erosive changes of the underlying ischial tuberosity are seen posteriorly consistent with osteomyelitis. Chronic dislocation of the hips is noted bilaterally. IMPRESSION: Significant progression in soft tissue wound overlying the left ischial tuberosity with underlying bony erosive changes consistent with osteomyelitis. Cholelithiasis without complicating factors. 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 Electronically Signed   By: Inez Catalina M.D.   On: 05/18/2020 19:28   DG Chest Port 1 View  Result Date: 05/18/2020 CLINICAL DATA:  Unresponsive.  Seizure disorder EXAM: PORTABLE CHEST 1 VIEW COMPARISON:  December 07, 2017 FINDINGS: There is ill-defined airspace opacity in the left base. The lungs elsewhere are clear. The heart size and pulmonary vascularity are normal. No adenopathy. No evident bone lesions. IMPRESSION: Ill-defined airspace opacity consistent with pneumonia left base. Lungs otherwise clear. Cardiac silhouette normal. Electronically Signed   By: Lowella Grip III M.D.   On: 05/18/2020 18:34    Scheduled Meds: . vitamin C  500 mg Per Tube Daily  . baclofen  20 mg Per Tube QID  . buPROPion  75 mg Per Tube TID  . dantrolene  50 mg Per Tube 4 times per day  . feeding supplement (JEVITY 1.5 CAL/FIBER)  840 mL Per Tube Q24H  . folic acid  1 mg Per Tube Daily  . free water  240 mL Per Tube Q4H  . levETIRAcetam  1,000 mg Per  Tube TID  . multivitamin with minerals  1 tablet Per Tube Daily  . pantoprazole sodium  40 mg Per Tube Daily  . polyvinyl alcohol  1 drop Both Eyes BID  . vitamin B-12  500 mcg Per Tube Daily   Continuous Infusions: . azithromycin    . ceFEPime (MAXIPIME) IV 2 g (05/19/20 1157)  . lactated ringers 125 mL/hr at 05/19/20 1003  . vancomycin 750 mg (05/19/20 0837)     LOS: 1 day   Time spent: 39 minutes   Darliss Cheney, MD [facility]  05/19/2020, 2:12 PM   How to contact the [facility] Attending or Consulting provider [place] or covering provider during after hours [place], for this patient?  1. Check the care team in [facility] and look for a) attending/consulting [facility] provider listed and b) the [facility] team listed. Page or secure chat [phone]. 2. Log into [email or web address] and use [place]'s universal password to access. If you do not have the password, please contact the hospital operator. 3. Locate the [facility] provider you are looking for under [facility] and page to a number that you can be directly reached. 4. If you still have difficulty reaching the provider, please page the [facility] (Director on Call) for the Hospitalists listed on amion for assistance.

## 2020-05-19 NOTE — Progress Notes (Signed)
Pt laying on bed, no changes from previous assessment.  Dr Doristine Bosworth saw Pt at bedside, Charge nurse; Suezanne Jacquet RN notified.

## 2020-05-19 NOTE — Plan of Care (Signed)
Pt non-verbal will address with family. Problem: Education: Goal: Knowledge of General Education information will improve Description: Including pain rating scale, medication(s)/side effects and non-pharmacologic comfort measures Outcome: Progressing

## 2020-05-19 NOTE — ED Notes (Signed)
Attempted to call report and advised to call back they hadnt assigned pt or looked at pt

## 2020-05-19 NOTE — Progress Notes (Signed)
   05/19/20 0226  Assess: MEWS Score  Temp 98.5 F (36.9 C)  BP 132/75  Pulse Rate (!) 109  Resp 18  SpO2 97 %  O2 Device Room Air  Assess: MEWS Score  MEWS Temp 0  MEWS Systolic 0  MEWS Pulse 1  MEWS RR 0  MEWS LOC 2  MEWS Score 3  MEWS Score Color Yellow    Notified charge RN, Chartered certified accountant and on-call Hospitalist, [name] Zierle-Ghosh MD. Pt in no pain or distress at this time.

## 2020-05-19 NOTE — ED Notes (Signed)
Attempted to call floor charge number at this time and there was no answer

## 2020-05-20 ENCOUNTER — Inpatient Hospital Stay (HOSPITAL_COMMUNITY): Payer: Medicare HMO

## 2020-05-20 DIAGNOSIS — G35 Multiple sclerosis: Secondary | ICD-10-CM | POA: Diagnosis not present

## 2020-05-20 DIAGNOSIS — Z7189 Other specified counseling: Secondary | ICD-10-CM | POA: Diagnosis not present

## 2020-05-20 DIAGNOSIS — A419 Sepsis, unspecified organism: Secondary | ICD-10-CM | POA: Diagnosis not present

## 2020-05-20 DIAGNOSIS — L8990 Pressure ulcer of unspecified site, unspecified stage: Secondary | ICD-10-CM | POA: Insufficient documentation

## 2020-05-20 DIAGNOSIS — L89209 Pressure ulcer of unspecified hip, unspecified stage: Secondary | ICD-10-CM | POA: Insufficient documentation

## 2020-05-20 DIAGNOSIS — I472 Ventricular tachycardia: Secondary | ICD-10-CM

## 2020-05-20 DIAGNOSIS — M4628 Osteomyelitis of vertebra, sacral and sacrococcygeal region: Secondary | ICD-10-CM

## 2020-05-20 DIAGNOSIS — Z515 Encounter for palliative care: Secondary | ICD-10-CM

## 2020-05-20 LAB — CBC WITH DIFFERENTIAL/PLATELET
Abs Immature Granulocytes: 0.18 10*3/uL — ABNORMAL HIGH (ref 0.00–0.07)
Basophils Absolute: 0.1 10*3/uL (ref 0.0–0.1)
Basophils Relative: 0 %
Eosinophils Absolute: 0.3 10*3/uL (ref 0.0–0.5)
Eosinophils Relative: 2 %
HCT: 34.9 % — ABNORMAL LOW (ref 39.0–52.0)
Hemoglobin: 11.1 g/dL — ABNORMAL LOW (ref 13.0–17.0)
Immature Granulocytes: 1 %
Lymphocytes Relative: 16 %
Lymphs Abs: 2.7 10*3/uL (ref 0.7–4.0)
MCH: 29.2 pg (ref 26.0–34.0)
MCHC: 31.8 g/dL (ref 30.0–36.0)
MCV: 91.8 fL (ref 80.0–100.0)
Monocytes Absolute: 1.6 10*3/uL — ABNORMAL HIGH (ref 0.1–1.0)
Monocytes Relative: 9 %
Neutro Abs: 12 10*3/uL — ABNORMAL HIGH (ref 1.7–7.7)
Neutrophils Relative %: 72 %
Platelets: 478 10*3/uL — ABNORMAL HIGH (ref 150–400)
RBC: 3.8 MIL/uL — ABNORMAL LOW (ref 4.22–5.81)
RDW: 12.8 % (ref 11.5–15.5)
WBC: 16.8 10*3/uL — ABNORMAL HIGH (ref 4.0–10.5)
nRBC: 0 % (ref 0.0–0.2)

## 2020-05-20 LAB — GLUCOSE, CAPILLARY
Glucose-Capillary: 221 mg/dL — ABNORMAL HIGH (ref 70–99)
Glucose-Capillary: 210 mg/dL — ABNORMAL HIGH (ref 70–99)
Glucose-Capillary: 208 mg/dL — ABNORMAL HIGH (ref 70–99)

## 2020-05-20 LAB — GASTROINTESTINAL PANEL BY PCR, STOOL (REPLACES STOOL CULTURE)

## 2020-05-20 LAB — CULTURE, BLOOD (ROUTINE X 2): Special Requests: ADEQUATE

## 2020-05-20 LAB — ECHOCARDIOGRAM COMPLETE
Calc EF: 43.9 %
Height: 76 in
S' Lateral: 3.3 cm
Single Plane A2C EF: 43 %
Single Plane A4C EF: 43 %
Weight: 2960 oz

## 2020-05-20 LAB — TROPONIN I (HIGH SENSITIVITY)
Troponin I (High Sensitivity): 7 ng/L (ref <18)
Troponin I (High Sensitivity): 10 ng/L (ref <18)
Troponin I (High Sensitivity): 5 ng/L (ref <18)

## 2020-05-20 LAB — BASIC METABOLIC PANEL
Anion gap: 9 (ref 5–15)
BUN: 6 mg/dL (ref 6–20)
CO2: 21 mmol/L — ABNORMAL LOW (ref 22–32)
Calcium: 8.2 mg/dL — ABNORMAL LOW (ref 8.9–10.3)
Chloride: 104 mmol/L (ref 98–111)
Creatinine, Ser: 0.4 mg/dL — ABNORMAL LOW (ref 0.61–1.24)
GFR, Estimated: >60 mL/min (ref >60)
Glucose, Bld: 253 mg/dL — ABNORMAL HIGH (ref 70–99)
Potassium: 3.3 mmol/L — ABNORMAL LOW (ref 3.5–5.1)
Sodium: 134 mmol/L — ABNORMAL LOW (ref 135–145)

## 2020-05-20 LAB — HEMOGLOBIN A1C
Hgb A1c MFr Bld: 8.6 % — ABNORMAL HIGH (ref 4.8–5.6)
Mean Plasma Glucose: 200.12 mg/dL

## 2020-05-20 LAB — MAGNESIUM: Magnesium: 2.1 mg/dL (ref 1.7–2.4)

## 2020-05-20 LAB — CK: Total CK: 66 U/L (ref 49–397)

## 2020-05-20 MED ORDER — JUVEN PO PACK
1.0000 | PACK | Freq: Two times a day (BID) | ORAL | Status: DC
  Administered 2020-05-20 – 2020-06-05 (×33): 1
  Filled 2020-05-20 (×32): qty 1

## 2020-05-20 MED ORDER — POTASSIUM CHLORIDE 10 MEQ/100ML IV SOLN
10.0000 meq | INTRAVENOUS | Status: AC
  Administered 2020-05-20 (×2): 10 meq via INTRAVENOUS
  Filled 2020-05-20: qty 100

## 2020-05-20 MED ORDER — INSULIN DETEMIR 100 UNIT/ML ~~LOC~~ SOLN
10.0000 [IU] | Freq: Every day | SUBCUTANEOUS | Status: DC
  Administered 2020-05-20: 10 [IU] via SUBCUTANEOUS
  Filled 2020-05-20 (×2): qty 0.1

## 2020-05-20 MED ORDER — INSULIN ASPART 100 UNIT/ML IJ SOLN
0.0000 [IU] | INTRAMUSCULAR | Status: DC
  Administered 2020-05-20 – 2020-05-21 (×4): 5 [IU] via SUBCUTANEOUS
  Administered 2020-05-21: 2 [IU] via SUBCUTANEOUS
  Administered 2020-05-21 (×4): 5 [IU] via SUBCUTANEOUS
  Administered 2020-05-22: 3 [IU] via SUBCUTANEOUS
  Administered 2020-05-22 (×2): 5 [IU] via SUBCUTANEOUS
  Administered 2020-05-22 (×2): 3 [IU] via SUBCUTANEOUS
  Administered 2020-05-22: 5 [IU] via SUBCUTANEOUS
  Administered 2020-05-23: 3 [IU] via SUBCUTANEOUS
  Administered 2020-05-23: 8 [IU] via SUBCUTANEOUS
  Administered 2020-05-23 (×2): 3 [IU] via SUBCUTANEOUS
  Administered 2020-05-23: 2 [IU] via SUBCUTANEOUS
  Administered 2020-05-23: 3 [IU] via SUBCUTANEOUS
  Administered 2020-05-23: 5 [IU] via SUBCUTANEOUS
  Administered 2020-05-24: 3 [IU] via SUBCUTANEOUS
  Administered 2020-05-24: 2 [IU] via SUBCUTANEOUS
  Administered 2020-05-24 – 2020-05-25 (×5): 3 [IU] via SUBCUTANEOUS
  Administered 2020-05-25: 5 [IU] via SUBCUTANEOUS
  Administered 2020-05-25: 3 [IU] via SUBCUTANEOUS
  Administered 2020-05-25: 2 [IU] via SUBCUTANEOUS
  Administered 2020-05-25: 5 [IU] via SUBCUTANEOUS
  Administered 2020-05-26 (×2): 3 [IU] via SUBCUTANEOUS
  Administered 2020-05-26: 2 [IU] via SUBCUTANEOUS
  Administered 2020-05-26: 3 [IU] via SUBCUTANEOUS
  Administered 2020-05-26: 5 [IU] via SUBCUTANEOUS
  Administered 2020-05-27: 3 [IU] via SUBCUTANEOUS
  Administered 2020-05-27: 5 [IU] via SUBCUTANEOUS
  Administered 2020-05-27 (×3): 3 [IU] via SUBCUTANEOUS
  Administered 2020-05-27 (×2): 5 [IU] via SUBCUTANEOUS
  Administered 2020-05-28 (×2): 3 [IU] via SUBCUTANEOUS
  Administered 2020-05-28: 8 [IU] via SUBCUTANEOUS
  Administered 2020-05-28: 3 [IU] via SUBCUTANEOUS
  Administered 2020-05-28: 2 [IU] via SUBCUTANEOUS
  Administered 2020-05-29: 3 [IU] via SUBCUTANEOUS
  Administered 2020-05-29: 5 [IU] via SUBCUTANEOUS
  Administered 2020-05-29: 8 [IU] via SUBCUTANEOUS
  Administered 2020-05-29: 3 [IU] via SUBCUTANEOUS
  Administered 2020-05-29 (×2): 5 [IU] via SUBCUTANEOUS
  Administered 2020-05-30: 8 [IU] via SUBCUTANEOUS
  Administered 2020-05-30 (×4): 3 [IU] via SUBCUTANEOUS
  Administered 2020-05-31: 5 [IU] via SUBCUTANEOUS
  Administered 2020-05-31 (×2): 3 [IU] via SUBCUTANEOUS
  Administered 2020-05-31: 5 [IU] via SUBCUTANEOUS
  Administered 2020-05-31 – 2020-06-01 (×4): 3 [IU] via SUBCUTANEOUS
  Administered 2020-06-01: 2 [IU] via SUBCUTANEOUS
  Administered 2020-06-01: 3 [IU] via SUBCUTANEOUS
  Administered 2020-06-01 – 2020-06-02 (×3): 2 [IU] via SUBCUTANEOUS
  Administered 2020-06-02: 3 [IU] via SUBCUTANEOUS
  Administered 2020-06-02 (×3): 2 [IU] via SUBCUTANEOUS
  Administered 2020-06-02 – 2020-06-03 (×2): 3 [IU] via SUBCUTANEOUS
  Administered 2020-06-03: 2 [IU] via SUBCUTANEOUS
  Administered 2020-06-03: 3 [IU] via SUBCUTANEOUS
  Administered 2020-06-03 (×2): 2 [IU] via SUBCUTANEOUS
  Administered 2020-06-03 – 2020-06-04 (×2): 3 [IU] via SUBCUTANEOUS
  Administered 2020-06-04: 2 [IU] via SUBCUTANEOUS
  Administered 2020-06-04: 3 [IU] via SUBCUTANEOUS
  Administered 2020-06-04: 2 [IU] via SUBCUTANEOUS
  Administered 2020-06-04 (×2): 3 [IU] via SUBCUTANEOUS
  Administered 2020-06-04: 2 [IU] via SUBCUTANEOUS
  Administered 2020-06-05: 3 [IU] via SUBCUTANEOUS
  Administered 2020-06-05: 2 [IU] via SUBCUTANEOUS

## 2020-05-20 MED ORDER — PROSOURCE TF PO LIQD
90.0000 mL | Freq: Two times a day (BID) | ORAL | Status: DC
  Administered 2020-05-20 – 2020-05-31 (×23): 90 mL
  Filled 2020-05-20 (×25): qty 90

## 2020-05-20 MED ORDER — JEVITY 1.5 CAL/FIBER PO LIQD
1000.0000 mL | ORAL | Status: AC
  Administered 2020-05-20 – 2020-05-25 (×7): 1000 mL
  Filled 2020-05-20 (×9): qty 1000

## 2020-05-20 MED ORDER — POTASSIUM CHLORIDE 10 MEQ/100ML IV SOLN
10.0000 meq | INTRAVENOUS | Status: AC
  Administered 2020-05-20 (×3): 10 meq via INTRAVENOUS
  Filled 2020-05-20 (×4): qty 100

## 2020-05-20 MED ORDER — SODIUM CHLORIDE 0.9 % IV SOLN
8.0000 mg/kg | Freq: Every day | INTRAVENOUS | Status: DC
  Administered 2020-05-20 – 2020-06-04 (×16): 650 mg via INTRAVENOUS
  Filled 2020-05-20 (×18): qty 13

## 2020-05-20 NOTE — Progress Notes (Addendum)
PROGRESS NOTE    [name]  [id number] DOB: 02/09/1975 DOA: 05/18/2020 PCP: [name], MD   Brief Narrative:  Terry Palmer is a 45 y.o. male with medical history significant of advanced multiple sclerosis, nonverbal at baseline, quadriplegic with contractures, neurogenic bladder with chronic suprapubic catheter, dysphagia with chronic indwelling PEG tube, dementia, history of seizures presented to the ED via EMS for evaluation of fevers and decubitus ulcer getting worse.  Wife states he has a chronic sacral wound which has been worsening for since March with more drainage.   In the ED, afebrile. Tachycardic with heart rate up to 120s and slightly tachypneic.  Not hypotensive.  Not hypoxic.  Labs showing WBC 19.3, hemoglobin 12.1 (stable compared to prior labs), platelet count 549K.  UA with negative nitrite, large amount of leukocytes, greater than 50 WBCs, and few bacteria.  Urine culture pending.  COVID and influenza panel negative.  Chest x-ray showing left lung base opacity suspicious for pneumonia.  Patient noted to have a large sacral wound.  CT showing significant progression and soft tissue wound overlying the left ischial tuberosity with underlying bony erosive changes consistent with osteomyelitis. Patient was given vancomycin, cefepime, and azithromycin and admitted under hospitalist service.  General surgery consulted who recommended hydrotherapy and then ID was consulted.  Assessment & Plan:   Principal Problem:   Sepsis ([place]) Active Problems:   Multiple sclerosis ([place])   UTI (urinary tract infection)   CAP (community acquired pneumonia)   Osteomyelitis ([place])   Pressure injury of skin  Sepsis Secondary tolarge sacral wound with signs of osteomyelitis/aspiration pneumonia: CT showing significant progression and soft tissue wound overlying the left ischial tuberosity with underlying bony erosive changes consistent with osteomyelitis.General surgery consulted.   They did not recommend any intervention.  They have ordered hydrotherapy through PT.  They recommend ID consultation.  Continues to have intermittent fever.  ID on board, patient remains on cefepime, azithromycin as well as vancomycin.  ID recommends waiting for 24 hours of afebrile.  Before placing PICC line.  They recommend 4 weeks of IV antibiotics.  Patient still remains tachycardic persistently.  Even when he is afebrile.  We will continue with IV fluids at 100 cc/h.  EKG shows sinus tachycardia.  Type 2 diabetes mellitus: Due to hyperglycemia on the BMP, hemoglobin A1c was checked and it is 8.6.  Patient is now diagnosed with type 2 diabetes mellitus.  No prior history of diabetes.  We will start him on 10 units of Lantus as well as SSI.  Bacteremia vs contamination: Only 1 of 4 bottles is growing Staphylococcus.  Likely contamination.  Final identification pending.  Continue above antibiotics.  I personally doubt catheter associated UTI.  This is likely contamination due to indwelling suprapubic catheter.  Hypokalemia: 3.3.  Will replace.  Recheck in the morning.  Diarrhea: 1 episode of loose stool yesterday.  C. difficile negative.  Diarrhea: Likely related to recent antibiotic use.  He is also receiving Dulcolax and MiraLAX at home.  Abdominal exam benign.  C. difficile negative.  GI pathogen panel pending.  Advanced underlying MS causing functional quadriplegia, dysphagia with chronic indwelling PEG, neurogenic bladder with chronic suprapubic catheter -Continue supportive care  Seizure disorder -Continue Keppra  Goal of care: Discussed with wife yesterday about thinking about CODE STATUS.  She wanted to continue his full code.  I have consulted palliative care yesterday to have goals of care conversation with wife as patient has poor prognosis and long-term  and perhaps in short-term as well.  DVT prophylaxis: SCDs Start: 05/18/20 2108   Code Status: Full Code  Family  Communication: None present at bedside.  Discussed in detail with his wife yesterday.  Called and left voicemail for wife.  Status is: Inpatient  Remains inpatient appropriate because:Inpatient level of care appropriate due to severity of illness   Dispo: The patient is from: Home              Anticipated d/c is to: Home              Patient currently is not medically stable to d/c.   Difficult to place patient No        Estimated body mass index is 22.52 kg/m as calculated from the following:   Height as of this encounter: 6\' 4"  (1.93 m).   Weight as of this encounter: 83.9 kg.  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 (Active)  05/20/20 1346  Location: Ischial tuberosity  Location Orientation: Left  Staging: Stage 4 - Full thickness tissue loss with exposed bone, tendon or muscle.  Wound Description (Comments):   Present on Admission: Yes     Nutritional status:  Nutrition Problem: Increased nutrient needs Etiology: wound healing   Signs/Symptoms: estimated needs   Interventions: Refer to RD note for recommendations,Tube feeding,MVI    Consultants:   ID  General surgery  Procedures:   None  Antimicrobials:  Anti-infectives (From admission, onward)   Start     Dose/Rate Route Frequency Ordered Stop   05/19/20 2000  azithromycin (ZITHROMAX) 500 mg in sodium chloride 0.9 % 250 mL IVPB  Status:  Discontinued        500 mg 250 mL/hr over 60 Minutes Intravenous Every 24 hours 05/18/20 2114 05/20/20 0901   05/19/20 1800  Ampicillin-Sulbactam (UNASYN) 3 g in sodium chloride 0.9 % 100 mL IVPB        3 g 200 mL/hr over 30 Minutes Intravenous Every 6 hours 05/19/20 1712     05/19/20 0800  vancomycin (VANCOREADY) IVPB 750 mg/150 mL        750 mg 150 mL/hr over 60 Minutes Intravenous Every 12 hours 05/18/20 2056     05/19/20 0400  ceFEPIme (MAXIPIME) 2 g in sodium chloride 0.9 %  100 mL IVPB  Status:  Discontinued        2 g 200 mL/hr over 30 Minutes Intravenous Every 8 hours 05/18/20 2056 05/19/20 1711   05/18/20 1845  vancomycin (VANCOREADY) IVPB 1500 mg/300 mL        1,500 mg 150 mL/hr over 120 Minutes Intravenous  Once 05/18/20 1844 05/18/20 2219   05/18/20 1845  ceFEPIme (MAXIPIME) 2 g in sodium chloride 0.9 % 100 mL IVPB        2 g 200 mL/hr over 30 Minutes Intravenous  Once 05/18/20 1844 05/18/20 2002   05/18/20 1845  azithromycin (ZITHROMAX) 500 mg in sodium chloride 0.9 % 250 mL IVPB        500 mg 250 mL/hr over 60 Minutes Intravenous  Once 05/18/20 1844 05/18/20 2142         Subjective: Seen and examined.  Lethargic as he was yesterday.  He is nonverbal  Objective: Vitals:   05/19/20 2200 05/19/20 2357 05/20/20 0400 05/20/20 1021  BP: 131/81 (!) 141/89 (!) 145/84 (!) 142/91  Pulse: (!) 114 (!) 118 (!) 113 (!) 122  Resp: 18 18 18  18  Temp: (!) 100.8 F (38.2 C) (!) 100.9 F (38.3 C) (!) 100.6 F (38.1 C) 100.2 F (37.9 C)  TempSrc:   Axillary Axillary  SpO2: 91% 97% 97% 99%  Weight:      Height:        Intake/Output Summary (Last 24 hours) at 05/20/2020 1443 Last data filed at 05/20/2020 0612 Gross per 24 hour  Intake 1138.07 ml  Output 3875 ml  Net -2736.93 ml   Filed Weights   05/18/20 2157 05/18/20 2246  Weight: 83.9 kg 83.9 kg    Examination: General exam: Mild tachypnea. Respiratory system: Diminished breath sounds with tachypnea. Cardiovascular system: S1 & S2 heard, sinus tachycardia. No JVD, murmurs, rubs, gallops or clicks. No pedal edema. Gastrointestinal system: Abdomen is nondistended, soft and nontender. No organomegaly or masses felt. Normal bowel sounds heard. Central nervous system: Lethargic and not oriented.  Contracted extremities. Skin: Dressing at the sacral area.  Direct visualization defer to general surgery.   Data Reviewed: I have personally reviewed following labs and imaging studies  CBC: Recent  Labs  Lab 05/18/20 1804 05/18/20 1811 05/19/20 0201 05/20/20 0149  WBC 19.3*  --  17.1* 16.8*  NEUTROABS 15.0*  --   --  12.0*  HGB 12.1* 12.9* 11.6* 11.1*  HCT 37.9* 38.0* 36.2* 34.9*  MCV 91.1  --  92.1 91.8  PLT 549*  --  499* 250*   Basic Metabolic Panel: Recent Labs  Lab 05/18/20 1804 05/18/20 1811 05/19/20 0201 05/20/20 0149  NA 133* 135 135 134*  K 4.1 4.1 3.8 3.3*  CL 100 100 101 104  CO2 23  --  23 21*  GLUCOSE 218* 222* 140* 253*  BUN 9 9 6 6   CREATININE 0.40* 0.30* <0.30* 0.40*  CALCIUM 9.0  --  8.9 8.2*  MG  --   --   --  2.1   GFR: Estimated Creatinine Clearance: 136.9 mL/min (A) (by C-G formula based on SCr of 0.4 mg/dL (L)). Liver Function Tests: Recent Labs  Lab 05/18/20 1804  AST 26  ALT 25  ALKPHOS 107  BILITOT 0.2*  PROT 7.6  ALBUMIN 2.7*   Recent Labs  Lab 05/18/20 1804  LIPASE 28   No results for input(s): AMMONIA in the last 168 hours. Coagulation Profile: Recent Labs  Lab 05/18/20 1804  INR 1.1   Cardiac Enzymes: No results for input(s): CKTOTAL, CKMB, CKMBINDEX, TROPONINI in the last 168 hours. BNP (last 3 results) No results for input(s): PROBNP in the last 8760 hours. HbA1C: Recent Labs    05/20/20 0827  HGBA1C 8.6*   CBG: Recent Labs  Lab 05/18/20 1751 05/20/20 1246  GLUCAP 218* 221*   Lipid Profile: No results for input(s): CHOL, HDL, LDLCALC, TRIG, CHOLHDL, LDLDIRECT in the last 72 hours. Thyroid Function Tests: No results for input(s): TSH, T4TOTAL, FREET4, T3FREE, THYROIDAB in the last 72 hours. Anemia Panel: No results for input(s): VITAMINB12, FOLATE, FERRITIN, TIBC, IRON, RETICCTPCT in the last 72 hours. Sepsis Labs: Recent Labs  Lab 05/18/20 1804 05/18/20 1953  LATICACIDVEN 1.4 1.3    Recent Results (from the past 240 hour(s))  Blood Culture (routine x 2)     Status: Abnormal   Collection Time: 05/18/20  5:53 PM   Specimen: BLOOD  Result Value Ref Range Status   Specimen Description BLOOD  SITE NOT SPECIFIED  Final   Special Requests   Final    BOTTLES DRAWN AEROBIC AND ANAEROBIC Blood Culture adequate volume   Culture  Setup Time   Final    GRAM POSITIVE COCCI IN CLUSTERS AEROBIC BOTTLE ONLY CRITICAL RESULT CALLED TO, READ BACK BY AND VERIFIED WITH: Salome Holmes PHARMD [id number] 05/19/20 A BROWNING    Culture (A)  Final    STAPHYLOCOCCUS HOMINIS THE SIGNIFICANCE OF ISOLATING THIS ORGANISM FROM A SINGLE SET OF BLOOD CULTURES WHEN MULTIPLE SETS ARE DRAWN IS UNCERTAIN. PLEASE NOTIFY THE MICROBIOLOGY DEPARTMENT WITHIN ONE WEEK IF SPECIATION AND SENSITIVITIES ARE REQUIRED. Performed at [facility], [place] [address], [place], [place] [address]    Report Status 05/20/2020 FINAL  Final  Blood Culture ID Panel (Reflexed)     Status: Abnormal   Collection Time: 05/18/20  5:53 PM  Result Value Ref Range Status   Enterococcus faecalis NOT DETECTED NOT DETECTED Final   Enterococcus Faecium NOT DETECTED NOT DETECTED Final   Listeria monocytogenes NOT DETECTED NOT DETECTED Final   Staphylococcus species DETECTED (A) NOT DETECTED Final    Comment: CRITICAL RESULT CALLED TO, READ BACK BY AND VERIFIED WITH: Salome Holmes PHARMD [id number] 05/19/20 A BROWNING    Staphylococcus aureus (BCID) NOT DETECTED NOT DETECTED Final   Staphylococcus epidermidis NOT DETECTED NOT DETECTED Final   Staphylococcus lugdunensis NOT DETECTED NOT DETECTED Final   Streptococcus species NOT DETECTED NOT DETECTED Final   Streptococcus agalactiae NOT DETECTED NOT DETECTED Final   Streptococcus pneumoniae NOT DETECTED NOT DETECTED Final   Streptococcus pyogenes NOT DETECTED NOT DETECTED Final   A.calcoaceticus-baumannii NOT DETECTED NOT DETECTED Final   Bacteroides fragilis NOT DETECTED NOT DETECTED Final   Enterobacterales NOT DETECTED NOT DETECTED Final   Enterobacter cloacae complex NOT DETECTED NOT DETECTED Final   Escherichia coli NOT DETECTED NOT DETECTED Final   Klebsiella aerogenes NOT DETECTED NOT DETECTED Final    Klebsiella oxytoca NOT DETECTED NOT DETECTED Final   Klebsiella pneumoniae NOT DETECTED NOT DETECTED Final   Proteus species NOT DETECTED NOT DETECTED Final   Salmonella species NOT DETECTED NOT DETECTED Final   Serratia marcescens NOT DETECTED NOT DETECTED Final   Haemophilus influenzae NOT DETECTED NOT DETECTED Final   Neisseria meningitidis NOT DETECTED NOT DETECTED Final   Pseudomonas aeruginosa NOT DETECTED NOT DETECTED Final   Stenotrophomonas maltophilia NOT DETECTED NOT DETECTED Final   Candida albicans NOT DETECTED NOT DETECTED Final   Candida auris NOT DETECTED NOT DETECTED Final   Candida glabrata NOT DETECTED NOT DETECTED Final   Candida krusei NOT DETECTED NOT DETECTED Final   Candida parapsilosis NOT DETECTED NOT DETECTED Final   Candida tropicalis NOT DETECTED NOT DETECTED Final   Cryptococcus neoformans/gattii NOT DETECTED NOT DETECTED Final    Comment: Performed at [facility], [address], [place], [place] [address]  Blood Culture (routine x 2)     Status: None (Preliminary result)   Collection Time: 05/18/20  6:24 PM   Specimen: BLOOD  Result Value Ref Range Status   Specimen Description BLOOD SITE NOT SPECIFIED  Final   Special Requests   Final    BOTTLES DRAWN AEROBIC AND ANAEROBIC Blood Culture adequate volume   Culture   Final    NO GROWTH 2 DAYS Performed at [facility] [address], [place], [place] [address]    Report Status PENDING  Incomplete  Resp Panel by RT-PCR (Flu A&B, Covid) Nasopharyngeal Swab     Status: None   Collection Time: 05/18/20  8:06 PM   Specimen: Nasopharyngeal Swab; Nasopharyngeal(NP) swabs in vial transport medium  Result Value Ref Range Status  SARS Coronavirus 2 by RT PCR NEGATIVE NEGATIVE Final    Comment: (NOTE) SARS-CoV-2 target nucleic acids are NOT DETECTED.  The SARS-CoV-2 RNA is generally detectable in upper respiratory specimens during the acute phase of infection. The lowest concentration of  SARS-CoV-2 viral copies this assay can detect is 138 copies/mL. A negative result does not preclude SARS-Cov-2 infection and should not be used as the sole basis for treatment or other patient management decisions. A negative result may occur with  improper specimen collection/handling, submission of specimen other than nasopharyngeal swab, presence of viral mutation(s) within the areas targeted by this assay, and inadequate number of viral copies(<138 copies/mL). A negative result must be combined with clinical observations, patient history, and epidemiological information. The expected result is Negative.  Fact Sheet for Patients:  BloggerCourse.com  Fact Sheet for Healthcare Providers:  SeriousBroker.it  This test is no t yet approved or cleared by the [place] FDA and  has been authorized for detection and/or diagnosis of SARS-CoV-2 by FDA under an Emergency Use Authorization (EUA). This EUA will remain  in effect (meaning this test can be used) for the duration of the COVID-19 declaration under Section 564(b)(1) of the Act, 21 U.S.C.section 360bbb-3(b)(1), unless the authorization is terminated  or revoked sooner.       Influenza A by PCR NEGATIVE NEGATIVE Final   Influenza B by PCR NEGATIVE NEGATIVE Final    Comment: (NOTE) The Xpert Xpress SARS-CoV-2/FLU/RSV plus assay is intended as an aid in the diagnosis of influenza from Nasopharyngeal swab specimens and should not be used as a sole basis for treatment. Nasal washings and aspirates are unacceptable for Xpert Xpress SARS-CoV-2/FLU/RSV testing.  Fact Sheet for Patients: BloggerCourse.com  Fact Sheet for Healthcare Providers: SeriousBroker.it  This test is not yet approved or cleared by the [place] FDA and has been authorized for detection and/or diagnosis of SARS-CoV-2 by FDA under an Emergency Use  Authorization (EUA). This EUA will remain in effect (meaning this test can be used) for the duration of the COVID-19 declaration under Section 564(b)(1) of the Act, 21 U.S.C. section 360bbb-3(b)(1), unless the authorization is terminated or revoked.  Performed at [facility], [address], [place], [place] [address]   C Difficile Quick Screen w PCR reflex     Status: None   Collection Time: 05/18/20 10:28 PM   Specimen: STOOL  Result Value Ref Range Status   C Diff antigen NEGATIVE NEGATIVE Final   C Diff toxin NEGATIVE NEGATIVE Final   C Diff interpretation No C. difficile detected.  Final    Comment: Performed at [facility], [address] [place], [place] [address]  Gastrointestinal Panel by PCR , Stool     Status: None   Collection Time: 05/18/20 10:28 PM   Specimen: STOOL  Result Value Ref Range Status   Campylobacter species NOT DETECTED NOT DETECTED Final   Plesimonas shigelloides NOT DETECTED NOT DETECTED Final   Salmonella species NOT DETECTED NOT DETECTED Final   Yersinia enterocolitica NOT DETECTED NOT DETECTED Final   Vibrio species NOT DETECTED NOT DETECTED Final   Vibrio cholerae NOT DETECTED NOT DETECTED Final   Enteroaggregative E coli (EAEC) NOT DETECTED NOT DETECTED Final   Enteropathogenic E coli (EPEC) NOT DETECTED NOT DETECTED Final   Enterotoxigenic E coli (ETEC) NOT DETECTED NOT DETECTED Final   Shiga like toxin producing E coli (STEC) NOT DETECTED NOT DETECTED Final   Shigella/Enteroinvasive E coli (EIEC) NOT DETECTED NOT DETECTED  Final   Cryptosporidium NOT DETECTED NOT DETECTED Final   Cyclospora cayetanensis NOT DETECTED NOT DETECTED Final   Entamoeba histolytica NOT DETECTED NOT DETECTED Final   Giardia lamblia NOT DETECTED NOT DETECTED Final   Adenovirus F40/41 NOT DETECTED NOT DETECTED Final   Astrovirus NOT DETECTED NOT DETECTED Final   Norovirus GI/GII NOT DETECTED NOT DETECTED Final   Rotavirus A NOT DETECTED NOT DETECTED Final    Sapovirus (I, II, IV, and V) NOT DETECTED NOT DETECTED Final    Comment: Performed at [facility], [address], [place], [place] [address]      Radiology Studies: CT ABDOMEN PELVIS WO CONTRAST  Result Date: 05/18/2020 CLINICAL DATA:  Abdominal pain and ischial tuberosity wound, initial encounter EXAM: CT ABDOMEN AND PELVIS WITHOUT CONTRAST TECHNIQUE: Multidetector CT imaging of the abdomen and pelvis was performed following the standard protocol without IV contrast. COMPARISON:  12/08/2017 FINDINGS: Lower chest: Lung bases demonstrate mild scarring bilaterally stable from the prior exam. Hepatobiliary: Liver is within normal limits. Gallbladder is well distended with dependent density consistent with small stones. Pancreas: Unremarkable. No pancreatic ductal dilatation or surrounding inflammatory changes. Spleen: Normal in size without focal abnormality. Adrenals/Urinary Tract: Adrenal glands are within normal limits. Kidneys demonstrate no renal calculi or urinary tract obstructive changes. The ureters are within normal limits. The bladder is decompressed by a suprapubic catheter. Stomach/Bowel: Colon shows no obstructive or inflammatory changes. Mild retained fecal material is seen although no obstructive changes are noted. The appendix is within normal limits. Small bowel and stomach are within normal limits with the exception of a gastrostomy catheter. Vascular/Lymphatic: Aortic atherosclerosis. No enlarged abdominal or pelvic lymph nodes. Reproductive: Prostate is unremarkable. Other: No abdominal wall hernia or abnormality. No abdominopelvic ascites. Musculoskeletal: Degenerative changes of lumbar spine are noted with scoliosis concave to the right. Sacrum is within normal limits. There is a soft tissue wound overlying the left ischial tuberosity posteriorly. This is consistent with a decubitus wound. Erosive changes of the underlying ischial tuberosity are seen posteriorly consistent  with osteomyelitis. Chronic dislocation of the hips is noted bilaterally. IMPRESSION: Significant progression in soft tissue wound overlying the left ischial tuberosity with underlying bony erosive changes consistent with osteomyelitis. Cholelithiasis without complicating factors. 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 Electronically Signed   By: Inez Catalina M.D.   On: 05/18/2020 19:28   DG Chest Port 1 View  Result Date: 05/18/2020 CLINICAL DATA:  Unresponsive.  Seizure disorder EXAM: PORTABLE CHEST 1 VIEW COMPARISON:  December 07, 2017 FINDINGS: There is ill-defined airspace opacity in the left base. The lungs elsewhere are clear. The heart size and pulmonary vascularity are normal. No adenopathy. No evident bone lesions. IMPRESSION: Ill-defined airspace opacity consistent with pneumonia left base. Lungs otherwise clear. Cardiac silhouette normal. Electronically Signed   By: Lowella Grip III M.D.   On: 05/18/2020 18:34    Scheduled Meds: . vitamin C  500 mg Per Tube Daily  . baclofen  20 mg Per Tube QID  . buPROPion  75 mg Per Tube TID  . Chlorhexidine Gluconate Cloth  6 each Topical Daily  . dantrolene  50 mg Per Tube 4 times per day  . feeding supplement (PROSource TF)  90 mL Per Tube BID  . folic acid  1 mg Per Tube Daily  . free water  240 mL Per Tube Q4H  . insulin aspart  0-15 Units Subcutaneous Q4H  . insulin detemir  10 Units Subcutaneous Daily  .  levETIRAcetam  1,000 mg Per Tube TID  . multivitamin with minerals  1 tablet Per Tube Daily  . nutrition supplement (JUVEN)  1 packet Per Tube BID BM  . pantoprazole sodium  40 mg Per Tube Daily  . polyvinyl alcohol  1 drop Both Eyes BID  . vitamin B-12  500 mcg Per Tube Daily   Continuous Infusions: . sodium chloride 100 mL/hr at 05/20/20 0435  . ampicillin-sulbactam (UNASYN) IV 3 g (05/20/20 1246)  . feeding supplement (JEVITY 1.5 CAL/FIBER) 1,000 mL (05/20/20 1246)  . potassium chloride 10 mEq (05/20/20  1254)  . vancomycin 750 mg (05/20/20 1001)     LOS: 2 days   Time spent: 30 minutes   Darliss Cheney, MD [facility]  05/20/2020, 2:43 PM   How to contact the [facility] Attending or Consulting provider [place] or covering provider during after hours [place], for this patient?  1. Check the care team in [facility] and look for a) attending/consulting [facility] provider listed and b) the [facility] team listed. Page or secure chat [phone]. 2. Log into [email or web address] and use [facility] universal password to access. If you do not have the password, please contact the hospital operator. 3. Locate the [facility] provider you are looking for under [facility] and page to a number that you can be directly reached. 4. If you still have difficulty reaching the provider, please page the [facility] (Director on Call) for the Hospitalists listed on amion for assistance.

## 2020-05-20 NOTE — Progress Notes (Signed)
   05/19/20 2357  Assess: MEWS Score  Temp (!) 100.9 F (38.3 C)  BP (!) 141/89  Pulse Rate (!) 118  Resp 18  SpO2 97 %  O2 Device Room Air  Assess: MEWS Score  MEWS Temp 1  MEWS Systolic 0  MEWS Pulse 2  MEWS RR 0  MEWS LOC 2  MEWS Score 5  MEWS Score Color Red  Assess: if the MEWS score is Yellow or Red  Were vital signs taken at a resting state? Yes  Focused Assessment No change from prior assessment  Early Detection of Sepsis Score *See Row Information* Low  MEWS guidelines implemented *See Row Information* No, previously red, continue vital signs every 4 hours  Treat  MEWS Interventions Other (Comment)  Document  Patient Outcome Other (Comment) (monitor)  Progress note created (see row info) Yes

## 2020-05-20 NOTE — Progress Notes (Signed)
  Echocardiogram 2D Echocardiogram has been performed.  Terry Palmer 05/20/2020, 4:06 PM

## 2020-05-20 NOTE — Progress Notes (Signed)
Physical Therapy Wound Treatment Patient Details  Name: Terry Palmer MRN: [id number] Date of Birth: Feb 28, 1975  Today's Date: 05/20/2020 Time: 1201-1227 Time Calculation (min): 26 min  Subjective  Subjective Assessment Subjective: None - pt asleep throughout the entirety of session and nonverbal at baseline Patient and Family Stated Goals: None Date of Onset:  (Unknown) Prior Treatments:  (Dressing changes)  Pain Score:  Pt asleep throughout session. Pt did not appear to be in any pain.   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 (Active)  Wound Image   05/20/20 1325  Dressing Type ABD;Barrier Film (skin prep);Gauze (Comment);Moist to moist 05/20/20 1325  Dressing Changed;Clean;Dry;Intact 05/20/20 1325  Dressing Change Frequency Daily 05/20/20 1325  State of Healing Eschar 05/20/20 1325  Site / Wound Assessment Black;Yellow;Pink 05/20/20 1325  % Wound base Red or Granulating 10% 05/20/20 1325  % Wound base Yellow/Fibrinous Exudate 25% 05/20/20 1325  % Wound base Black/Eschar 65% 05/20/20 1325  % Wound base Other/Granulation Tissue (Comment) 0% 05/20/20 1325  Peri-wound Assessment Bleeding 05/20/20 1325  Wound Length (cm) 9 cm 05/20/20 1325  Wound Width (cm) 8.5 cm 05/20/20 1325  Wound Depth (cm) 4.5 cm 05/20/20 1325  Wound Surface Area (cm^2) 76.5 cm^2 05/20/20 1325  Wound Volume (cm^3) 344.25 cm^3 05/20/20 1325  Tunneling (cm) 5 05/20/20 1325  Undermining (cm) (varying up to 4.5 cm 05/20/20 1325  Margins Unattached edges (unapproximated) 05/20/20 1325  Drainage Amount Minimal 05/20/20 1325  Drainage Description Serosanguineous 05/20/20 1325  Treatment Debridement (Selective);Hydrotherapy (Pulse lavage);Packing (Saline gauze) 05/20/20 1325   Hydrotherapy Pulsed lavage therapy - wound location: L ischial tuberosity Pulsed Lavage with Suction (psi): 12 psi Pulsed Lavage  with Suction - Normal Saline Used: 1000 mL Pulsed Lavage Tip: Tip with splash shield Selective Debridement Selective Debridement - Location: L ischial tuberosity Selective Debridement - Tools Used: Forceps,Scalpel Selective Debridement - Tissue Removed: Black and yellow unviable tissue    Wound Assessment and Plan  Wound Therapy - Assess/Plan/Recommendations Wound Therapy - Clinical Statement: Pt presents to hydrotherapy with a stage 4 ischial tuberosity wound. Pt with liquid diarrhea 2 c-diff during session making treatment difficult at times. This patient will benefit from continued hydrotherapy for selective removal of unviable tissue, to decrease bioburden, and promote wound bed healing. Wound Therapy - Functional Problem List: Decreased strength, AROM, contractures, immobility due to MS Factors Delaying/Impairing Wound Healing: Immobility,Multiple medical problems,Incontinence Hydrotherapy Plan: Debridement,Dressing change,Pulsatile lavage with suction,Patient/family education Wound Therapy - Frequency: 6X / week Wound Therapy - Follow Up Recommendations: dressing changes by RN  Wound Therapy Goals- Improve the function of patient's integumentary system by progressing the wound(s) through the phases of wound healing (inflammation - proliferation - remodeling) by: Wound Therapy Goals - Improve the function of patient's integumentary system by progressing the wound(s) through the phases of wound healing by: Decrease Necrotic Tissue to: 20% Decrease Necrotic Tissue - Progress: Goal set today Increase Granulation Tissue to: 80% Increase Granulation Tissue - Progress: Goal set today Goals/treatment plan/discharge plan were made with and agreed upon by patient/family: Yes Time For Goal Achievement: 7 days Wound Therapy - Potential for Goals: Fair  Goals will be updated until maximal potential achieved or discharge criteria met.  Discharge criteria: when goals achieved, discharge from  hospital, MD decision/surgical intervention, no progress towards goals, refusal/missing three consecutive treatments without notification or medical reason.  GP     Charges PT Wound Care Charges $  Wound Debridement up to 20 cm: < or equal to 20 cm $ Wound Debridement each add'l 20 sqcm: 3 $PT PLS Gun and Tip: 1 Supply $PT Hydrotherapy Visit: 1 Visit       Thelma Comp 05/20/2020, 2:56 PM   Rolinda Roan, PT, DPT Acute Rehabilitation Services Pager: [phone] Office: [phone]

## 2020-05-20 NOTE — TOC Initial Note (Addendum)
Transition of Care [facility]) - Initial/Assessment Note    Patient Details  Name: Terry Palmer MRN: [id number] Date of Birth: Nov 05, 1975  Transition of Care [facility]) CM/SW Contact:    Marilu Favre, RN Phone Number: 05/20/2020, 5:03 PM  Clinical Narrative:                 Patient non verbal. Called wife [name] [phone]. Patient from home with wife. Face sheet information is correct.   Discussed discharging home on IV ABX. Patient has [facility] [phone] , LPN's and nurses daily from 8 am until 8 pm. [name] would like them to assit with IV ABX.   NCM called  [facility] [phone] ,spoke to [place]. They can assist with IV ABX but she will have to "sign off " some of her staff. Also for labs Olivia Mackie will need to draw them, which she is able to if they are once a week and she is provided all lab supplies and told where to send blood work.  The earliest they could assist with IV ABX and labs would be Monday.   Spoke to [employer] with Ilda Foil, if patient discharges on Vancomycin he will need labs twice a week, if he discharges on daptomycin he will need labs once a week. [facility] can provide lab supplies and blood work can be sent to [facility].   Will await IV ABX determination.   Patient has suction, hospital bed with air mattress and hoyer lift at home already   Expected Discharge Plan: [place] Barriers to Discharge: Continued Medical Work up   Patient Goals and CMS Choice     Choice offered to / list presented to : Spouse  Expected Discharge Plan and Services Expected Discharge Plan: [place]   Discharge Planning Services: CM Consult Post Acute Care Choice: [place] arrangements for the past 2 months: Apartment                 DME Arranged: N/A DME Agency: NA       HH Arranged: RN          Prior Living Arrangements/Services Living arrangements for the past 2 months: Apartment Lives with:: Spouse Patient  language and need for interpreter reviewed:: Yes Do you feel safe going back to the place where you live?: Yes      Need for Family Participation in Patient Care: Yes (Comment) Care giver support system in place?: Yes (comment) Current home services: DME Criminal Activity/Legal Involvement Pertinent to Current Situation/Hospitalization: No - Comment as needed  Activities of Daily Living Home Assistive Devices/Equipment: None ADL Screening (condition at time of admission) Patient's cognitive ability adequate to safely complete daily activities?: No Is the patient deaf or have difficulty hearing?: No Does the patient have difficulty seeing, even when wearing glasses/contacts?: Yes Does the patient have difficulty concentrating, remembering, or making decisions?: Yes Patient able to express need for assistance with ADLs?: No Does the patient have difficulty dressing or bathing?: Yes Independently performs ADLs?: No Communication: Dependent Is this a change from baseline?: Pre-admission baseline Dressing (OT): Dependent Is this a change from baseline?: Pre-admission baseline Grooming: Dependent Is this a change from baseline?: Pre-admission baseline Feeding: Dependent Is this a change from baseline?: Pre-admission baseline Bathing: Dependent Is this a change from baseline?: Pre-admission baseline Toileting: Dependent Is this a change from baseline?: Pre-admission baseline In/Out Bed: Dependent Is this a change from baseline?: Pre-admission baseline Walks  in Home: Dependent (Bedbound) Is this a change from baseline?: Pre-admission baseline Does the patient have difficulty walking or climbing stairs?: Yes Weakness of Legs: Both Weakness of Arms/Hands: Both  Permission Sought/Granted                  Emotional Assessment              Admission diagnosis:  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 Pressure injury of skin 05/20/2020  . CAP (community acquired pneumonia) 05/18/2020  . Osteomyelitis ([place]) 05/18/2020  . Goals of care, counseling/discussion   . Palliative care by specialist   . DNR (do not resuscitate) discussion   . Palliative care encounter   . Acute metabolic encephalopathy 73/22/0254  . Pneumonia due to infectious organism   . 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 Lactic acidosis 04/27/2016  . Acute cystitis with hematuria   . Suprapubic catheter ([place])   . G tube feedings ([place])   . Flexion contractures   . Encephalopathy   . Bacteremia 05/27/2015  . Sepsis ([place]) 05/25/2015  . Altered mental state 05/25/2015  . Altered mental status 05/25/2015  . Fever, unspecified 03/04/2015  . Quadriplegia and quadriparesis ([place]) 12/24/2014  . 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 Fever   . 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 Protein-calorie malnutrition, severe ([place]) 10/21/2014  . UTI (urinary tract infection) 10/20/2014  . Sepsis secondary to UTI ([place]) 10/20/2014  . Leukocytosis 10/20/2014  . Acute respiratory failure with hypoxia ([place]) 10/20/2014  . Seizure disorder ([place]) 10/20/2014  . Aspiration pneumonitis ([place]) 10/20/2014  . Neurogenic bladder 05/28/2011  . Multiple sclerosis ([place]) 04/20/2011  . FTT (failure to thrive) in adult 04/20/2011  . Sacral decubitus ulcer, stage II ([place]) 04/20/2011  . Quadriparesis (muscle weakness) 03/12/2011   PCP:  [name], MD Pharmacy:   [facility] [place] [address] [place] [address] Phone: [phone] Fax: [phone]     Social Determinants of Health (SDOH) Interventions    Readmission Risk Interventions No flowsheet data found.

## 2020-05-20 NOTE — Progress Notes (Signed)
Initial Nutrition Assessment  DOCUMENTATION CODES:   Not applicable  INTERVENTION:   Tube Feeding via PEG: increase TF regimen given increased needs for wound healing, infection Jevity 1.5 at 55 ml/hr (1320 mL) Pro-Source TF 90 mL BID (4 packets per day) TF regimen provides 2140 kcals, 127 g of protein and 1003 mL of free water Plan to make further recommendations regarding TF regimen based on weight changes, wound healing, etc  Total free water with increase in TF and current free water: 2450 mL  Need new measured weight  Add Juven BID via PEG, each packet provides 80 calories, 8 grams of carbohydrate, 2.5  grams of protein (collagen), 7 grams of L-arginine and 7 grams of L-glutamine; supplement contains CaHMB, Vitamins C, E, B12 and Zinc to promote wound healing   NUTRITION DIAGNOSIS:   Increased nutrient needs related to wound healing as evidenced by estimated needs.  GOAL:   Patient will meet greater than or equal to 90% of their needs  MONITOR:   TF tolerance,Skin,Weight trends  REASON FOR ASSESSMENT:   New TF    ASSESSMENT:   45 yo male admitted with sepsis due to large sacral wound with osteomyelitis. Pt with advanced MS, nonverbal at baseline, functional quad with contractures, dysphagia with PEG tube.   NPO, PEG tube in place Pt receiving Jevity 1.5 840 mL over 24 hours per order. Free water 240 mL q 4 hours Per home medication list, pt receiving Jevity 1.5 at 70 ml/hr x 12 hours providing 1260 kcals, 53 g of protein. This is not adequate to promote wound healing and given current sepsis. Plan to increase nutrition regimen  No weight loss per weight encounters but unsure if admission wt is stated or measured. Plan to obtain new weight  Pt with left ischial wound with significant necrotic tissue and exposed bone. Picture noted in chart.  Surgery recommending hydrotherapy.   Labs: reviewed Meds: MVI with Minerals, Vitamin O67, Vitamin C, folic  acid    Diet Order:   Diet Order            Diet NPO time specified  Diet effective now                 EDUCATION NEEDS:   Not appropriate for education at this time  Skin:  Skin Assessment: Reviewed RN Assessment  Last BM:  5/13  Height:   Ht Readings from Last 1 Encounters:  05/18/20 6\' 4"  (1.93 m)    Weight:   Wt Readings from Last 1 Encounters:  05/18/20 83.9 kg    BMI:  Body mass index is 22.52 kg/m.  Estimated Nutritional Needs:   Kcal:  2000-2200 kcals  Protein:  125-165 g  Fluid:  >/= 2 L   Kerman Passey MS, RDN, LDN, CNSC Registered Dietitian III Clinical Nutrition RD Pager and On-Call Pager Number Located in [place]

## 2020-05-20 NOTE — Progress Notes (Signed)
[facility]    Date of Admission:  05/18/2020   Total days of antibiotics 3   ID: Terry Palmer is a 45 y.o. male with aspiration pneumonia, and ischial osteomyelitis Principal Problem:   Sepsis ([place]) Active Problems:   Multiple sclerosis ([place])   UTI (urinary tract infection)   CAP (community acquired pneumonia)   Osteomyelitis ([place])    Subjective: Had isolated temp of 100.23F last night. Looks comfortable this morning  GYJ:EHUDJS to obtain since non verbal  Medications:  . vitamin C  500 mg Per Tube Daily  . baclofen  20 mg Per Tube QID  . buPROPion  75 mg Per Tube TID  . Chlorhexidine Gluconate Cloth  6 each Topical Daily  . dantrolene  50 mg Per Tube 4 times per day  . feeding supplement (PROSource TF)  90 mL Per Tube BID  . folic acid  1 mg Per Tube Daily  . free water  240 mL Per Tube Q4H  . insulin aspart  0-15 Units Subcutaneous Q4H  . insulin detemir  10 Units Subcutaneous Daily  . levETIRAcetam  1,000 mg Per Tube TID  . multivitamin with minerals  1 tablet Per Tube Daily  . nutrition supplement (JUVEN)  1 packet Per Tube BID BM  . pantoprazole sodium  40 mg Per Tube Daily  . polyvinyl alcohol  1 drop Both Eyes BID  . vitamin B-12  500 mcg Per Tube Daily    Objective: Vital signs in last 24 hours: Temp:  [99.9 F (37.7 C)-100.9 F (38.3 C)] 100.2 F (37.9 C) (05/13 1021) Pulse Rate:  [113-123] 122 (05/13 1021) Resp:  [17-19] 18 (05/13 1021) BP: (131-146)/(81-91) 142/91 (05/13 1021) SpO2:  [91 %-99 %] 99 % (05/13 1021) Physical Exam  Constitutional: sleeping. Does not open eyes to verbal stimuli. Appears chronically ill and under-nourished. No distress.  HENT:  Mouth/Throat: Oropharynx is clear and dry Cardiovascular: Normal rate, regular rhythm and normal heart sounds. Exam reveals no gallop and no friction rub.  No murmur heard.  Pulmonary/Chest: Effort normal and breath sounds normal. No respiratory distress. He has no  wheezes.  Abdominal: Soft. Bowel sounds are normal. He exhibits no distension. There is no tenderness. Peg in place Ext: contracted extremities Skin: Skin is warm and dry. No rash noted. No erythema.     Lab Results Recent Labs    05/19/20 0201 05/20/20 0149  WBC 17.1* 16.8*  HGB 11.6* 11.1*  HCT 36.2* 34.9*  NA 135 134*  K 3.8 3.3*  CL 101 104  CO2 23 21*  BUN 6 6  CREATININE <0.30* 0.40*   Liver Panel Recent Labs    05/18/20 1804  PROT 7.6  ALBUMIN 2.7*  AST 26  ALT 25  ALKPHOS 107  BILITOT 0.2*    Microbiology: Blood cx 5/11  With 1 of 4 bottles staph species Studies/Results: CT ABDOMEN PELVIS WO CONTRAST  Result Date: 05/18/2020 CLINICAL DATA:  Abdominal pain and ischial tuberosity wound, initial encounter EXAM: CT ABDOMEN AND PELVIS WITHOUT CONTRAST TECHNIQUE: Multidetector CT imaging of the abdomen and pelvis was performed following the standard protocol without IV contrast. COMPARISON:  12/08/2017 FINDINGS: Lower chest: Lung bases demonstrate mild scarring bilaterally stable from the prior exam. Hepatobiliary: Liver is within normal limits. Gallbladder is well distended with dependent density consistent with small stones. Pancreas: Unremarkable. No pancreatic ductal dilatation or surrounding inflammatory changes. Spleen: Normal in size without focal abnormality. Adrenals/Urinary Tract: Adrenal glands are  within normal limits. Kidneys demonstrate no renal calculi or urinary tract obstructive changes. The ureters are within normal limits. The bladder is decompressed by a suprapubic catheter. Stomach/Bowel: Colon shows no obstructive or inflammatory changes. Mild retained fecal material is seen although no obstructive changes are noted. The appendix is within normal limits. Small bowel and stomach are within normal limits with the exception of a gastrostomy catheter. Vascular/Lymphatic: Aortic atherosclerosis. No enlarged abdominal or pelvic lymph nodes. Reproductive:  Prostate is unremarkable. Other: No abdominal wall hernia or abnormality. No abdominopelvic ascites. Musculoskeletal: Degenerative changes of lumbar spine are noted with scoliosis concave to the right. Sacrum is within normal limits. There is a soft tissue wound overlying the left ischial tuberosity posteriorly. This is consistent with a decubitus wound. Erosive changes of the underlying ischial tuberosity are seen posteriorly consistent with osteomyelitis. Chronic dislocation of the hips is noted bilaterally. IMPRESSION: Significant progression in soft tissue wound overlying the left ischial tuberosity with underlying bony erosive changes consistent with osteomyelitis. Cholelithiasis without complicating factors. 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 Electronically Signed   By: Inez Catalina M.D.   On: 05/18/2020 19:28   DG Chest Port 1 View  Result Date: 05/18/2020 CLINICAL DATA:  Unresponsive.  Seizure disorder EXAM: PORTABLE CHEST 1 VIEW COMPARISON:  December 07, 2017 FINDINGS: There is ill-defined airspace opacity in the left base. The lungs elsewhere are clear. The heart size and pulmonary vascularity are normal. No adenopathy. No evident bone lesions. IMPRESSION: Ill-defined airspace opacity consistent with pneumonia left base. Lungs otherwise clear. Cardiac silhouette normal. Electronically Signed   By: Lowella Grip III M.D.   On: 05/18/2020 18:34     Assessment/Plan: Aspiration pneumonia = continue with unasyn  Ischial osteomyelitis = currently on vancomycin in addition to unasyn. Will see if patient qualifies for daptomycin and consider changing on discharge. Anticipate to treat for 4 wk with iv therapy. Continue with hydrotherapy.  Staph bacteremia = suspect it is contaminant since only 1 of 4 bottles  Isolated fever = continue to monitor over the next 24hrs before placing picc line  [facility] Cell:  [phone] Pager: [phone]  05/20/2020, 11:41 AM
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
## 2020-05-21 DIAGNOSIS — A419 Sepsis, unspecified organism: Secondary | ICD-10-CM | POA: Diagnosis not present

## 2020-05-21 LAB — CBC WITH DIFFERENTIAL/PLATELET
Abs Immature Granulocytes: 0.2 10*3/uL — ABNORMAL HIGH (ref 0.00–0.07)
Basophils Absolute: 0.1 10*3/uL (ref 0.0–0.1)
Basophils Relative: 0 %
Eosinophils Absolute: 0.4 10*3/uL (ref 0.0–0.5)
Eosinophils Relative: 2 %
HCT: 36.3 % — ABNORMAL LOW (ref 39.0–52.0)
Hemoglobin: 11.4 g/dL — ABNORMAL LOW (ref 13.0–17.0)
Immature Granulocytes: 1 %
Lymphocytes Relative: 20 %
Lymphs Abs: 3.4 10*3/uL (ref 0.7–4.0)
MCH: 28.8 pg (ref 26.0–34.0)
MCHC: 31.4 g/dL (ref 30.0–36.0)
MCV: 91.7 fL (ref 80.0–100.0)
Monocytes Absolute: 1.6 10*3/uL — ABNORMAL HIGH (ref 0.1–1.0)
Monocytes Relative: 9 %
Neutro Abs: 11.9 10*3/uL — ABNORMAL HIGH (ref 1.7–7.7)
Neutrophils Relative %: 68 %
Platelets: 437 10*3/uL — ABNORMAL HIGH (ref 150–400)
RBC: 3.96 MIL/uL — ABNORMAL LOW (ref 4.22–5.81)
RDW: 12.8 % (ref 11.5–15.5)
WBC: 17.5 10*3/uL — ABNORMAL HIGH (ref 4.0–10.5)
nRBC: 0 % (ref 0.0–0.2)

## 2020-05-21 LAB — BASIC METABOLIC PANEL
Anion gap: 11 (ref 5–15)
BUN: 11 mg/dL (ref 6–20)
CO2: 19 mmol/L — ABNORMAL LOW (ref 22–32)
Calcium: 8.4 mg/dL — ABNORMAL LOW (ref 8.9–10.3)
Chloride: 106 mmol/L (ref 98–111)
Creatinine, Ser: 0.32 mg/dL — ABNORMAL LOW (ref 0.61–1.24)
GFR, Estimated: >60 mL/min (ref >60)
Glucose, Bld: 197 mg/dL — ABNORMAL HIGH (ref 70–99)
Potassium: 3.8 mmol/L (ref 3.5–5.1)
Sodium: 136 mmol/L (ref 135–145)

## 2020-05-21 LAB — GLUCOSE, CAPILLARY
Glucose-Capillary: 176 mg/dL — ABNORMAL HIGH (ref 70–99)
Glucose-Capillary: 203 mg/dL — ABNORMAL HIGH (ref 70–99)
Glucose-Capillary: 222 mg/dL — ABNORMAL HIGH (ref 70–99)
Glucose-Capillary: 229 mg/dL — ABNORMAL HIGH (ref 70–99)
Glucose-Capillary: 242 mg/dL — ABNORMAL HIGH (ref 70–99)
Glucose-Capillary: 261 mg/dL — ABNORMAL HIGH (ref 70–99)

## 2020-05-21 MED ORDER — INSULIN DETEMIR 100 UNIT/ML ~~LOC~~ SOLN
20.0000 [IU] | Freq: Every day | SUBCUTANEOUS | Status: DC
  Administered 2020-05-21 – 2020-05-31 (×11): 20 [IU] via SUBCUTANEOUS
  Filled 2020-05-21 (×12): qty 0.2

## 2020-05-21 NOTE — Progress Notes (Signed)
Physical Therapy Wound Treatment Patient Details  Name: Terry Palmer MRN: [id number] Date of Birth: 09/18/1975  Today's Date: 05/21/2020 Time: 0211-1552 Time Calculation (min): 41 min  Subjective  Subjective Assessment Subjective: None - pt asleep throughout the entirety of session and nonverbal at baseline Patient and Family Stated Goals: None Date of Onset:  (Unknown) Prior Treatments:  (Dressing changes)  Pain Score:    Wound Assessment  Pressure Injury 05/20/20 Ischial tuberosity Left Stage 4 - Full thickness tissue loss with exposed bone, tendon or muscle. (Active)  Wound Image  05/20/20 1325  Dressing Type ABD;Barrier Film (skin prep);Gauze (Comment);Moist to moist 05/21/20 1130  Dressing Changed;Clean;Dry;Intact 05/21/20 1130  Dressing Change Frequency Twice a day 05/21/20 1130  State of Healing Eschar 05/21/20 1130  Site / Wound Assessment Bleeding;Yellow;Pink 05/21/20 1130  % Wound base Red or Granulating 20% 05/21/20 1130  % Wound base Yellow/Fibrinous Exudate 80% 05/21/20 1130  % Wound base Black/Eschar 0% 05/21/20 1130  % Wound base Other/Granulation Tissue (Comment) 0% 05/20/20 1325  Peri-wound Assessment Intact 05/21/20 1130  Wound Length (cm) 9 cm 05/20/20 1325  Wound Width (cm) 8.5 cm 05/20/20 1325  Wound Depth (cm) 4.5 cm 05/20/20 1325  Wound Surface Area (cm^2) 76.5 cm^2 05/20/20 1325  Wound Volume (cm^3) 344.25 cm^3 05/20/20 1325  Tunneling (cm) 5 05/20/20 1325  Undermining (cm) (varying up to 4.5 cm 05/20/20 1325  Margins Unattached edges (unapproximated) 05/21/20 1130  Drainage Amount Minimal 05/21/20 1130  Drainage Description Serosanguineous 05/21/20 1130  Treatment Debridement (Selective);Hydrotherapy (Pulse lavage);Packing (Saline gauze) 05/21/20 1130   Hydrotherapy Pulsed lavage therapy - wound location: L ischial tuberosity Pulsed Lavage with Suction (psi): 8 psi (to 12) Pulsed Lavage with Suction - Normal Saline Used: 1000 mL Pulsed Lavage  Tip: Tip with splash shield Selective Debridement Selective Debridement - Location: L ischial tuberosity Selective Debridement - Tools Used: Forceps,Scalpel Selective Debridement - Tissue Removed: yellow unviable tissue    Wound Assessment and Plan  Wound Therapy - Assess/Plan/Recommendations Wound Therapy - Clinical Statement: Pt presents to hydrotherapy with a stage 4 ischial tuberosity wound. His LE contractures make positioning for debridement challenging. Pt with liquid diarrhea during session making treatment difficult at times. This patient will benefit from continued hydrotherapy for selective removal of unviable tissue, to decrease bioburden, and promote wound bed healing. Wound Therapy - Functional Problem List: Decreased strength, AROM, contractures, immobility due to MS Factors Delaying/Impairing Wound Healing: Immobility,Multiple medical problems,Incontinence Hydrotherapy Plan: Debridement,Dressing change,Pulsatile lavage with suction,Patient/family education Wound Therapy - Frequency: 6X / week Wound Therapy - Follow Up Recommendations: dressing changes by RN  Wound Therapy Goals- Improve the function of patient's integumentary system by progressing the wound(s) through the phases of wound healing (inflammation - proliferation - remodeling) by: Wound Therapy Goals - Improve the function of patient's integumentary system by progressing the wound(s) through the phases of wound healing by: Decrease Necrotic Tissue to: 20% Decrease Necrotic Tissue - Progress: Progressing toward goal Increase Granulation Tissue to: 80% Increase Granulation Tissue - Progress: Progressing toward goal Goals/treatment plan/discharge plan were made with and agreed upon by patient/family: Yes Time For Goal Achievement: 7 days Wound Therapy - Potential for Goals: Fair  Goals will be updated until maximal potential achieved or discharge criteria met.  Discharge criteria: when goals achieved, discharge  from hospital, MD decision/surgical intervention, no progress towards goals, refusal/missing three consecutive treatments without notification or medical reason.  GP     Charges PT Wound Care Charges $Wound Debridement up to 20 cm: <  or equal to 20 cm $ Wound Debridement each add'l 20 sqcm: 3 $PT PLS Gun and Tip: 1 Supply $PT Hydrotherapy Visit: 1 Visit      Arby Barrette, PT Pager [phone]   Rexanne Mano 05/21/2020, 11:37 AM

## 2020-05-21 NOTE — Progress Notes (Signed)
PROGRESS NOTE    Terry Palmer  ZYS:[id number] DOB: [birth year] DOA: 05/18/2020 PCP: [name], MD   Brief Narrative:  Terry Palmer is a 45 y.o. male with medical history significant of advanced multiple sclerosis, nonverbal at baseline, quadriplegic with contractures, neurogenic bladder with chronic suprapubic catheter, dysphagia with chronic indwelling PEG tube, dementia, history of seizures presented to the ED via EMS for evaluation of fevers and decubitus ulcer getting worse.  Wife states he has a chronic sacral wound which has been worsening for since March with more drainage.   In the ED, afebrile. Tachycardic with heart rate up to 120s and slightly tachypneic.  Not hypotensive.  Not hypoxic.  Labs showing WBC 19.3, hemoglobin 12.1 (stable compared to prior labs), platelet count 549K.  UA with negative nitrite, large amount of leukocytes, greater than 50 WBCs, and few bacteria.  Urine culture pending.  COVID and influenza panel negative.  Chest x-ray showing left lung base opacity suspicious for pneumonia.  Patient noted to have a large sacral wound.  CT showing significant progression and soft tissue wound overlying the left ischial tuberosity with underlying bony erosive changes consistent with osteomyelitis. Patient was given vancomycin, cefepime, and azithromycin and admitted under hospitalist service.  General surgery consulted who recommended hydrotherapy and then ID was consulted.  Assessment & Plan:   Principal Problem:   Sepsis ([place]) Active Problems:   Multiple sclerosis ([place])   UTI (urinary tract infection)   CAP (community acquired pneumonia)   Osteomyelitis ([place])   Pressure injury of skin  Sepsis Secondary to large sacral wound with signs of osteomyelitis/aspiration pneumonia: CT showing significant progression and soft tissue wound overlying the left ischial tuberosity with underlying bony erosive changes consistent with osteomyelitis.General surgery consulted.   They did not recommend any intervention.  They have ordered hydrotherapy through PT.  They recommend ID consultation.  Continues to have intermittent fever.  ID on board,  ID recommends waiting for 24 hours of afebrile.  Before placing PICC line.  Last time he was febrile was around 3 PM yesterday with a temperature of 1-1.3.  Would watch another day before placing PICC line.  They recommend 4 weeks of IV antibiotics.  Patient still remains tachycardic persistently.  However less so when he is afebrile.  We will continue with IV fluids at 100 cc/h.  EKG shows sinus tachycardia.  Patient remains on Zosyn and daptomycin.  Hydrotherapy ongoing every day per general surgery recommendations.  Type 2 diabetes mellitus: Due to hyperglycemia on the BMP, hemoglobin A1c was checked and it is 8.6.  Patient is now diagnosed with type 2 diabetes mellitus.  No prior history of diabetes.  Started on 10 units of Lantus yesterday but is still hyperglycemic.  Will increase to 20 units and continue SSI.  Bacteremia vs contamination: Only 1 of 4 bottles is growing Staphylococcus.  Likely contamination.  Final identification pending.  Continue above antibiotics.  I personally doubt catheter associated UTI.  This is likely contamination due to indwelling suprapubic catheter.  Hypokalemia: Resolved.  Diarrhea: Likely related to recent antibiotic use.  He was also receiving Dulcolax and MiraLAX at home.  Abdominal exam benign.  C. difficile negative.  GI pathogen panel negative  Advanced underlying MS causing functional quadriplegia, dysphagia with chronic indwelling PEG, neurogenic bladder with chronic suprapubic catheter -Continue supportive care  Seizure disorder -Continue Keppra  Goal of care: Discussed with wife yesterday about thinking about CODE STATUS.  She wanted to continue his full code.  I have consulted palliative care yesterday to have goals of care conversation with wife as patient has poor prognosis and  long-term and perhaps in short-term as well.  DVT prophylaxis: SCDs Start: 05/18/20 2108   Code Status: Full Code  Family Communication: None present at bedside.  Discussed with his wife over the phone.  Status is: Inpatient  Remains inpatient appropriate because:Inpatient level of care appropriate due to severity of illness   Dispo: The patient is from: Home              Anticipated d/c is to: Home              Patient currently is not medically stable to d/c.   Difficult to place patient No        Estimated body mass index is 22.52 kg/m as calculated from the following:   Height as of this encounter: 6\' 4"  (1.93 m).   Weight as of this encounter: 83.9 kg.  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 (Active)  05/20/20 1346  Location: Ischial tuberosity  Location Orientation: Left  Staging: Stage 4 - Full thickness tissue loss with exposed bone, tendon or muscle.  Wound Description (Comments):   Present on Admission: Yes     Nutritional status:  Nutrition Problem: Increased nutrient needs Etiology: wound healing   Signs/Symptoms: estimated needs   Interventions: Refer to RD note for recommendations,Tube feeding,MVI    Consultants:   ID  General surgery  Procedures:   None  Antimicrobials:  Anti-infectives (From admission, onward)   Start     Dose/Rate Route Frequency Ordered Stop   05/20/20 2000  DAPTOmycin (CUBICIN) 650 mg in sodium chloride 0.9 % IVPB        8 mg/kg  83.9 kg 126 mL/hr over 30 Minutes Intravenous Daily 05/20/20 1544     05/19/20 2000  azithromycin (ZITHROMAX) 500 mg in sodium chloride 0.9 % 250 mL IVPB  Status:  Discontinued        500 mg 250 mL/hr over 60 Minutes Intravenous Every 24 hours 05/18/20 2114 05/20/20 0901   05/19/20 1800  Ampicillin-Sulbactam (UNASYN) 3 g in sodium chloride 0.9 % 100 mL IVPB        3 g 200 mL/hr over 30 Minutes  Intravenous Every 6 hours 05/19/20 1712     05/19/20 0800  vancomycin (VANCOREADY) IVPB 750 mg/150 mL  Status:  Discontinued        750 mg 150 mL/hr over 60 Minutes Intravenous Every 12 hours 05/18/20 2056 05/20/20 1544   05/19/20 0400  ceFEPIme (MAXIPIME) 2 g in sodium chloride 0.9 % 100 mL IVPB  Status:  Discontinued        2 g 200 mL/hr over 30 Minutes Intravenous Every 8 hours 05/18/20 2056 05/19/20 1711   05/18/20 1845  vancomycin (VANCOREADY) IVPB 1500 mg/300 mL        1,500 mg 150 mL/hr over 120 Minutes Intravenous  Once 05/18/20 1844 05/18/20 2219   05/18/20 1845  ceFEPIme (MAXIPIME) 2 g in sodium chloride 0.9 % 100 mL IVPB        2 g 200 mL/hr over 30 Minutes Intravenous  Once 05/18/20 1844 05/18/20 2002   05/18/20 1845  azithromycin (ZITHROMAX) 500 mg in sodium chloride 0.9 % 250 mL IVPB        500 mg 250 mL/hr over 60 Minutes Intravenous  Once 05/18/20 1844 05/18/20 2142  Subjective: Seen and examined.  Slightly more awake today.  Nonverbal.  Looks comfortable.  Objective: Vitals:   05/21/20 0008 05/21/20 0435 05/21/20 0818 05/21/20 1152  BP: (!) 155/96 (!) 156/98 136/88 (!) 149/92  Pulse: (!) 105 (!) 110 (!) 109 (!) 110  Resp: 18 17 18 18   Temp: 98.7 F (37.1 C) 99.9 F (37.7 C) 99.6 F (37.6 C) 98.4 F (36.9 C)  TempSrc: Oral Oral Axillary Axillary  SpO2: 100% 100% 99% 98%  Weight:      Height:        Intake/Output Summary (Last 24 hours) at 05/21/2020 1440 Last data filed at 05/21/2020 1333 Gross per 24 hour  Intake 2688.72 ml  Output 3050 ml  Net -361.28 ml   Filed Weights   05/18/20 2157 05/18/20 2246  Weight: 83.9 kg 83.9 kg    Examination: General exam: Appears calm and comfortable  Respiratory system: Clear to auscultation. Respiratory effort normal. Cardiovascular system: S1 & S2 heard, RRR. No JVD, murmurs, rubs, gallops or clicks. No pedal edema. Gastrointestinal system: Abdomen is nondistended, soft and nontender. No organomegaly or  masses felt. Normal bowel sounds heard. Central nervous system: Less lethargic, not oriented.  Contracted extremities. Extremities: Symmetric 5 x 5 power. Skin: Dressing at the sacral wound.  Data Reviewed: I have personally reviewed following labs and imaging studies  CBC: Recent Labs  Lab 05/18/20 1804 05/18/20 1811 05/19/20 0201 05/20/20 0149 05/21/20 0125  WBC 19.3*  --  17.1* 16.8* 17.5*  NEUTROABS 15.0*  --   --  12.0* 11.9*  HGB 12.1* 12.9* 11.6* 11.1* 11.4*  HCT 37.9* 38.0* 36.2* 34.9* 36.3*  MCV 91.1  --  92.1 91.8 91.7  PLT 549*  --  499* 478* [id number]*   Basic Metabolic Panel: Recent Labs  Lab 05/18/20 1804 05/18/20 1811 05/19/20 0201 05/20/20 0149 05/21/20 0125  NA 133* 135 135 134* 136  K 4.1 4.1 3.8 3.3* 3.8  CL 100 100 101 104 106  CO2 23  --  23 21* 19*  GLUCOSE 218* 222* 140* 253* 197*  BUN 9 9 6 6 11   CREATININE 0.40* 0.30* <0.30* 0.40* 0.32*  CALCIUM 9.0  --  8.9 8.2* 8.4*  MG  --   --   --  2.1  --    GFR: Estimated Creatinine Clearance: 136.9 mL/min (A) (by C-G formula based on SCr of 0.32 mg/dL (L)). Liver Function Tests: Recent Labs  Lab 05/18/20 1804  AST 26  ALT 25  ALKPHOS 107  BILITOT 0.2*  PROT 7.6  ALBUMIN 2.7*   Recent Labs  Lab 05/18/20 1804  LIPASE 28   No results for input(s): AMMONIA in the last 168 hours. Coagulation Profile: Recent Labs  Lab 05/18/20 1804  INR 1.1   Cardiac Enzymes: Recent Labs  Lab 05/20/20 1529  CKTOTAL 66   BNP (last 3 results) No results for input(s): PROBNP in the last 8760 hours. HbA1C: Recent Labs    05/20/20 0827  HGBA1C 8.6*   CBG: Recent Labs  Lab 05/20/20 2016 05/21/20 0004 05/21/20 0431 05/21/20 0809 05/21/20 1147  GLUCAP 208* 222* 229* 203* 176*   Lipid Profile: No results for input(s): CHOL, HDL, LDLCALC, TRIG, CHOLHDL, LDLDIRECT in the last 72 hours. Thyroid Function Tests: No results for input(s): TSH, T4TOTAL, FREET4, T3FREE, THYROIDAB in the last 72  hours. Anemia Panel: No results for input(s): VITAMINB12, FOLATE, FERRITIN, TIBC, IRON, RETICCTPCT in the last 72 hours. Sepsis Labs: Recent Labs  Lab 05/18/20 1804 05/18/20  1953  LATICACIDVEN 1.4 1.3    Recent Results (from the past 240 hour(s))  Blood Culture (routine x 2)     Status: Abnormal   Collection Time: 05/18/20  5:53 PM   Specimen: BLOOD  Result Value Ref Range Status   Specimen Description BLOOD SITE NOT SPECIFIED  Final   Special Requests   Final    BOTTLES DRAWN AEROBIC AND ANAEROBIC Blood Culture adequate volume   Culture  Setup Time   Final    GRAM POSITIVE COCCI IN CLUSTERS AEROBIC BOTTLE ONLY CRITICAL RESULT CALLED TO, READ BACK BY AND VERIFIED WITH: Salome Holmes PHARMD [id number] 05/19/20 A BROWNING    Culture (A)  Final    STAPHYLOCOCCUS HOMINIS THE SIGNIFICANCE OF ISOLATING THIS ORGANISM FROM A SINGLE SET OF BLOOD CULTURES WHEN MULTIPLE SETS ARE DRAWN IS UNCERTAIN. PLEASE NOTIFY THE MICROBIOLOGY DEPARTMENT WITHIN ONE WEEK IF SPECIATION AND SENSITIVITIES ARE REQUIRED. Performed at [facility], [place] [address] [place], [place] [address]    Report Status 05/20/2020 FINAL  Final  Blood Culture ID Panel (Reflexed)     Status: Abnormal   Collection Time: 05/18/20  5:53 PM  Result Value Ref Range Status   Enterococcus faecalis NOT DETECTED NOT DETECTED Final   Enterococcus Faecium NOT DETECTED NOT DETECTED Final   Listeria monocytogenes NOT DETECTED NOT DETECTED Final   Staphylococcus species DETECTED (A) NOT DETECTED Final    Comment: CRITICAL RESULT CALLED TO, READ BACK BY AND VERIFIED WITH: Salome Holmes PHARMD [id number] 05/19/20 A BROWNING    Staphylococcus aureus (BCID) NOT DETECTED NOT DETECTED Final   Staphylococcus epidermidis NOT DETECTED NOT DETECTED Final   Staphylococcus lugdunensis NOT DETECTED NOT DETECTED Final   Streptococcus species NOT DETECTED NOT DETECTED Final   Streptococcus agalactiae NOT DETECTED NOT DETECTED Final   Streptococcus pneumoniae NOT  DETECTED NOT DETECTED Final   Streptococcus pyogenes NOT DETECTED NOT DETECTED Final   A.calcoaceticus-baumannii NOT DETECTED NOT DETECTED Final   Bacteroides fragilis NOT DETECTED NOT DETECTED Final   Enterobacterales NOT DETECTED NOT DETECTED Final   Enterobacter cloacae complex NOT DETECTED NOT DETECTED Final   Escherichia coli NOT DETECTED NOT DETECTED Final   Klebsiella aerogenes NOT DETECTED NOT DETECTED Final   Klebsiella oxytoca NOT DETECTED NOT DETECTED Final   Klebsiella pneumoniae NOT DETECTED NOT DETECTED Final   Proteus species NOT DETECTED NOT DETECTED Final   Salmonella species NOT DETECTED NOT DETECTED Final   Serratia marcescens NOT DETECTED NOT DETECTED Final   Haemophilus influenzae NOT DETECTED NOT DETECTED Final   Neisseria meningitidis NOT DETECTED NOT DETECTED Final   Pseudomonas aeruginosa NOT DETECTED NOT DETECTED Final   Stenotrophomonas maltophilia NOT DETECTED NOT DETECTED Final   Candida albicans NOT DETECTED NOT DETECTED Final   Candida auris NOT DETECTED NOT DETECTED Final   Candida glabrata NOT DETECTED NOT DETECTED Final   Candida krusei NOT DETECTED NOT DETECTED Final   Candida parapsilosis NOT DETECTED NOT DETECTED Final   Candida tropicalis NOT DETECTED NOT DETECTED Final   Cryptococcus neoformans/gattii NOT DETECTED NOT DETECTED Final    Comment: Performed at [facility], [address] [place], [place] [address]  Blood Culture (routine x 2)     Status: None (Preliminary result)   Collection Time: 05/18/20  6:24 PM   Specimen: BLOOD  Result Value Ref Range Status   Specimen Description BLOOD SITE NOT SPECIFIED  Final   Special Requests   Final    BOTTLES DRAWN AEROBIC AND ANAEROBIC Blood  Culture adequate volume   Culture   Final    NO GROWTH 2 DAYS Performed at [facility], [place] [address], [place], [place] [address]    Report Status PENDING  Incomplete  Resp Panel by RT-PCR (Flu A&B, Covid) Nasopharyngeal Swab     Status: None    Collection Time: 05/18/20  8:06 PM   Specimen: Nasopharyngeal Swab; Nasopharyngeal(NP) swabs in vial transport medium  Result Value Ref Range Status   SARS Coronavirus 2 by RT PCR NEGATIVE NEGATIVE Final    Comment: (NOTE) SARS-CoV-2 target nucleic acids are NOT DETECTED.  The SARS-CoV-2 RNA is generally detectable in upper respiratory specimens during the acute phase of infection. The lowest concentration of SARS-CoV-2 viral copies this assay can detect is 138 copies/mL. A negative result does not preclude SARS-Cov-2 infection and should not be used as the sole basis for treatment or other patient management decisions. A negative result may occur with  improper specimen collection/handling, submission of specimen other than nasopharyngeal swab, presence of viral mutation(s) within the areas targeted by this assay, and inadequate number of viral copies(<138 copies/mL). A negative result must be combined with clinical observations, patient history, and epidemiological information. The expected result is Negative.  Fact Sheet for Patients:  EntrepreneurPulse.com.au  Fact Sheet for Healthcare Providers:  IncredibleEmployment.be  This test is no t yet approved or cleared by the [place] FDA and  has been authorized for detection and/or diagnosis of SARS-CoV-2 by FDA under an Emergency Use Authorization (EUA). This EUA will remain  in effect (meaning this test can be used) for the duration of the COVID-19 declaration under Section 564(b)(1) of the Act, 21 U.S.C.section 360bbb-3(b)(1), unless the authorization is terminated  or revoked sooner.       Influenza A by PCR NEGATIVE NEGATIVE Final   Influenza B by PCR NEGATIVE NEGATIVE Final    Comment: (NOTE) The Xpert Xpress SARS-CoV-2/FLU/RSV plus assay is intended as an aid in the diagnosis of influenza from Nasopharyngeal swab specimens and should not be used as a sole basis for treatment.  Nasal washings and aspirates are unacceptable for Xpert Xpress SARS-CoV-2/FLU/RSV testing.  Fact Sheet for Patients: EntrepreneurPulse.com.au  Fact Sheet for Healthcare Providers: IncredibleEmployment.be  This test is not yet approved or cleared by the [place] FDA and has been authorized for detection and/or diagnosis of SARS-CoV-2 by FDA under an Emergency Use Authorization (EUA). This EUA will remain in effect (meaning this test can be used) for the duration of the COVID-19 declaration under Section 564(b)(1) of the Act, 21 U.S.C. section 360bbb-3(b)(1), unless the authorization is terminated or revoked.  Performed at [facility], [place] [address], [place], [place] [address]   C Difficile Quick Screen w PCR reflex     Status: None   Collection Time: 05/18/20 10:28 PM   Specimen: STOOL  Result Value Ref Range Status   C Diff antigen NEGATIVE NEGATIVE Final   C Diff toxin NEGATIVE NEGATIVE Final   C Diff interpretation No C. difficile detected.  Final    Comment: Performed at [facility], [place] [address], [place],  [address]  Gastrointestinal Panel by PCR , Stool     Status: None   Collection Time: 05/18/20 10:28 PM   Specimen: STOOL  Result Value Ref Range Status   Campylobacter species NOT DETECTED NOT DETECTED Final   Plesimonas shigelloides NOT DETECTED NOT DETECTED Final   Salmonella species NOT DETECTED NOT DETECTED Final   Yersinia enterocolitica  NOT DETECTED NOT DETECTED Final   Vibrio species NOT DETECTED NOT DETECTED Final   Vibrio cholerae NOT DETECTED NOT DETECTED Final   Enteroaggregative E coli (EAEC) NOT DETECTED NOT DETECTED Final   Enteropathogenic E coli (EPEC) NOT DETECTED NOT DETECTED Final   Enterotoxigenic E coli (ETEC) NOT DETECTED NOT DETECTED Final   Shiga like toxin producing E coli (STEC) NOT DETECTED NOT DETECTED Final   Shigella/Enteroinvasive E coli (EIEC) NOT DETECTED NOT DETECTED  Final   Cryptosporidium NOT DETECTED NOT DETECTED Final   Cyclospora cayetanensis NOT DETECTED NOT DETECTED Final   Entamoeba histolytica NOT DETECTED NOT DETECTED Final   Giardia lamblia NOT DETECTED NOT DETECTED Final   Adenovirus F40/41 NOT DETECTED NOT DETECTED Final   Astrovirus NOT DETECTED NOT DETECTED Final   Norovirus GI/GII NOT DETECTED NOT DETECTED Final   Rotavirus A NOT DETECTED NOT DETECTED Final   Sapovirus (I, II, IV, and V) NOT DETECTED NOT DETECTED Final    Comment: Performed at [facility], [place] [place], [place] [address]      Radiology Studies: ECHOCARDIOGRAM COMPLETE  Result Date: 05/20/2020    ECHOCARDIOGRAM REPORT   Patient Name:   JERL NOWELS [facility] Date of Exam: 05/20/2020 Medical Rec #:  [id number]        Height:       76.0 in Accession #:    [id number]       Weight:       185.0 lb Date of Birth:  Feb 18, 1975       BSA:          2.143 m Patient Age:    77 years         BP:           142/91 mmHg Patient Gender: M                HR:           119 bpm. Exam Location:  Inpatient Procedure: 2D Echo Indications:    Ventricular Tachycardia  History:        Patient has prior history of Echocardiogram examinations, most                 recent 08/09/2005. Multiple sclerosis. Sepsis.  Sonographer:    Johny Chess Referring Phys: [id number] Sudie Bandel Rooks  1. Left ventricular ejection fraction, by estimation, is 55%. The left ventricle has normal function. The left ventricle has no regional wall motion abnormalities. Left ventricular diastolic parameters are consistent with Grade I diastolic dysfunction (impaired relaxation).  2. Right ventricular systolic function is normal. The right ventricular size is normal. Tricuspid regurgitation signal is inadequate for assessing PA pressure.  3. The mitral valve is normal in structure. No evidence of mitral valve regurgitation. No evidence of mitral stenosis.  4. The aortic valve is tricuspid. Aortic valve  regurgitation is not visualized. No aortic stenosis is present.  5. The IVC was not visualized. FINDINGS  Left Ventricle: Left ventricular ejection fraction, by estimation, is 55%. The left ventricle has normal function. The left ventricle has no regional wall motion abnormalities. The left ventricular internal cavity size was normal in size. There is no left ventricular hypertrophy. Left ventricular diastolic parameters are consistent with Grade I diastolic dysfunction (impaired relaxation). Right Ventricle: The right ventricular size is normal. No increase in right ventricular wall thickness. Right ventricular systolic function is normal. Tricuspid regurgitation signal is inadequate for assessing PA pressure. Left Atrium: Left atrial size was normal  in size. Right Atrium: Right atrial size was normal in size. Pericardium: There is no evidence of pericardial effusion. Mitral Valve: The mitral valve is normal in structure. No evidence of mitral valve regurgitation. No evidence of mitral valve stenosis. Tricuspid Valve: The tricuspid valve is normal in structure. Tricuspid valve regurgitation is not demonstrated. Aortic Valve: The aortic valve is tricuspid. Aortic valve regurgitation is not visualized. No aortic stenosis is present. Pulmonic Valve: The pulmonic valve was normal in structure. Pulmonic valve regurgitation is not visualized. Aorta: The aortic root is normal in size and structure. Venous: The inferior vena cava was not well visualized. IAS/Shunts: No atrial level shunt detected by color flow Doppler.  LEFT VENTRICLE PLAX 2D LVIDd:         4.80 cm     Diastology LVIDs:         3.30 cm     LV e' medial: 7.62 cm/s LV PW:         0.90 cm LV IVS:        0.80 cm LVOT diam:     2.50 cm LV SV:         67 LV SV Index:   31 LVOT Area:     4.91 cm  LV Volumes (MOD) LV vol d, MOD A2C: 85.2 ml LV vol d, MOD A4C: 80.3 ml LV vol s, MOD A2C: 48.6 ml LV vol s, MOD A4C: 45.8 ml LV SV MOD A2C:     36.6 ml LV SV MOD A4C:      80.3 ml LV SV MOD BP:      36.9 ml RIGHT VENTRICLE RV S prime:     18.20 cm/s TAPSE (M-mode): 1.7 cm LEFT ATRIUM           Index       RIGHT ATRIUM          Index LA diam:      2.30 cm 1.07 cm/m  RA Area:     9.01 cm LA Vol (A2C): 33.5 ml 15.63 ml/m RA Volume:   14.50 ml 6.77 ml/m LA Vol (A4C): 31.2 ml 14.56 ml/m  AORTIC VALVE LVOT Vmax:   91.40 cm/s LVOT Vmean:  58.900 cm/s LVOT VTI:    0.137 m  AORTA Ao Root diam: 3.10 cm  SHUNTS Systemic VTI:  0.14 m Systemic Diam: 2.50 cm Loralie Champagne MD Electronically signed by Loralie Champagne MD Signature Date/Time: 05/20/2020/4:18:51 PM    Final     Scheduled Meds: . vitamin C  500 mg Per Tube Daily  . baclofen  20 mg Per Tube QID  . buPROPion  75 mg Per Tube TID  . Chlorhexidine Gluconate Cloth  6 each Topical Daily  . dantrolene  50 mg Per Tube 4 times per day  . feeding supplement (PROSource TF)  90 mL Per Tube BID  . folic acid  1 mg Per Tube Daily  . free water  240 mL Per Tube Q4H  . insulin aspart  0-15 Units Subcutaneous Q4H  . insulin detemir  20 Units Subcutaneous Daily  . levETIRAcetam  1,000 mg Per Tube TID  . multivitamin with minerals  1 tablet Per Tube Daily  . nutrition supplement (JUVEN)  1 packet Per Tube BID BM  . pantoprazole sodium  40 mg Per Tube Daily  . polyvinyl alcohol  1 drop Both Eyes BID  . vitamin B-12  500 mcg Per Tube Daily   Continuous Infusions: . sodium chloride 100 mL/hr at 05/21/20  9379  . ampicillin-sulbactam (UNASYN) IV 3 g (05/21/20 1236)  . DAPTOmycin (CUBICIN)  IV Stopped (05/21/20 0455)  . feeding supplement (JEVITY 1.5 CAL/FIBER) 1,000 mL (05/21/20 0455)     LOS: 3 days   Time spent: 29 minutes   Darliss Cheney, MD [facility]  05/21/2020, 2:40 PM   How to contact the [facility] Attending or Consulting provider [place] or covering provider during after hours [place], for this patient?  1. Check the care team in [facility] and look for a) attending/consulting [facility] provider listed and b) the [facility] team  listed. Page or secure chat [phone]. 2. Log into [email or web address] and use [place]'s universal password to access. If you do not have the password, please contact the hospital operator. 3. Locate the [facility] provider you are looking for under [facility] and page to a number that you can be directly reached. 4. If you still have difficulty reaching the provider, please page the [facility] (Director on Call) for the Hospitalists listed on amion for assistance.

## 2020-05-22 ENCOUNTER — Inpatient Hospital Stay (HOSPITAL_COMMUNITY): Payer: Medicare HMO

## 2020-05-22 DIAGNOSIS — J189 Pneumonia, unspecified organism: Secondary | ICD-10-CM | POA: Diagnosis not present

## 2020-05-22 DIAGNOSIS — M869 Osteomyelitis, unspecified: Secondary | ICD-10-CM | POA: Diagnosis not present

## 2020-05-22 DIAGNOSIS — A419 Sepsis, unspecified organism: Secondary | ICD-10-CM | POA: Diagnosis not present

## 2020-05-22 DIAGNOSIS — R4182 Altered mental status, unspecified: Secondary | ICD-10-CM

## 2020-05-22 DIAGNOSIS — Z7189 Other specified counseling: Secondary | ICD-10-CM | POA: Diagnosis not present

## 2020-05-22 LAB — BASIC METABOLIC PANEL
Anion gap: 9 (ref 5–15)
BUN: 10 mg/dL (ref 6–20)
CO2: 22 mmol/L (ref 22–32)
Calcium: 8.3 mg/dL — ABNORMAL LOW (ref 8.9–10.3)
Chloride: 106 mmol/L (ref 98–111)
Creatinine, Ser: <0.3 mg/dL — ABNORMAL LOW (ref 0.61–1.24)
Glucose, Bld: 216 mg/dL — ABNORMAL HIGH (ref 70–99)
Potassium: 3.2 mmol/L — ABNORMAL LOW (ref 3.5–5.1)
Sodium: 137 mmol/L (ref 135–145)

## 2020-05-22 LAB — CBC WITH DIFFERENTIAL/PLATELET
Abs Immature Granulocytes: 0.16 10*3/uL — ABNORMAL HIGH (ref 0.00–0.07)
Basophils Absolute: 0.1 10*3/uL (ref 0.0–0.1)
Basophils Relative: 1 %
Eosinophils Absolute: 0.5 10*3/uL (ref 0.0–0.5)
Eosinophils Relative: 3 %
HCT: 32.7 % — ABNORMAL LOW (ref 39.0–52.0)
Hemoglobin: 10.5 g/dL — ABNORMAL LOW (ref 13.0–17.0)
Immature Granulocytes: 1 %
Lymphocytes Relative: 19 %
Lymphs Abs: 2.9 10*3/uL (ref 0.7–4.0)
MCH: 29.3 pg (ref 26.0–34.0)
MCHC: 32.1 g/dL (ref 30.0–36.0)
MCV: 91.3 fL (ref 80.0–100.0)
Monocytes Absolute: 1.1 10*3/uL — ABNORMAL HIGH (ref 0.1–1.0)
Monocytes Relative: 7 %
Neutro Abs: 10.4 10*3/uL — ABNORMAL HIGH (ref 1.7–7.7)
Neutrophils Relative %: 69 %
Platelets: 472 10*3/uL — ABNORMAL HIGH (ref 150–400)
RBC: 3.58 MIL/uL — ABNORMAL LOW (ref 4.22–5.81)
RDW: 12.9 % (ref 11.5–15.5)
WBC: 15.1 10*3/uL — ABNORMAL HIGH (ref 4.0–10.5)
nRBC: 0.1 % (ref 0.0–0.2)

## 2020-05-22 LAB — GLUCOSE, CAPILLARY
Glucose-Capillary: 153 mg/dL — ABNORMAL HIGH (ref 70–99)
Glucose-Capillary: 155 mg/dL — ABNORMAL HIGH (ref 70–99)
Glucose-Capillary: 175 mg/dL — ABNORMAL HIGH (ref 70–99)
Glucose-Capillary: 187 mg/dL — ABNORMAL HIGH (ref 70–99)
Glucose-Capillary: 205 mg/dL — ABNORMAL HIGH (ref 70–99)
Glucose-Capillary: 209 mg/dL — ABNORMAL HIGH (ref 70–99)
Glucose-Capillary: 213 mg/dL — ABNORMAL HIGH (ref 70–99)
Glucose-Capillary: 213 mg/dL — ABNORMAL HIGH (ref 70–99)
Glucose-Capillary: 281 mg/dL — ABNORMAL HIGH (ref 70–99)

## 2020-05-22 LAB — BLOOD GAS, ARTERIAL
Acid-base deficit: 0.1 mmol/L (ref 0.0–2.0)
Bicarbonate: 24 mmol/L (ref 20.0–28.0)
FIO2: 32
O2 Saturation: 99.8 %
Patient temperature: 36.9
pCO2 arterial: 38.7 mmHg (ref 32.0–48.0)
pH, Arterial: 7.409 (ref 7.350–7.450)
pO2, Arterial: 349 mmHg — ABNORMAL HIGH (ref 83.0–108.0)

## 2020-05-22 MED ORDER — WHITE PETROLATUM EX OINT
TOPICAL_OINTMENT | CUTANEOUS | Status: AC
  Administered 2020-05-22: 0.2
  Filled 2020-05-22: qty 28.35

## 2020-05-22 MED ORDER — POTASSIUM CHLORIDE 10 MEQ/100ML IV SOLN
10.0000 meq | INTRAVENOUS | Status: AC
  Administered 2020-05-22 (×5): 10 meq via INTRAVENOUS
  Filled 2020-05-22 (×5): qty 100

## 2020-05-22 NOTE — Progress Notes (Addendum)
Subjective:   Patient nonverbal  Antibiotics:  Anti-infectives (From admission, onward)   Start     Dose/Rate Route Frequency Ordered Stop   05/20/20 2000  DAPTOmycin (CUBICIN) 650 mg in sodium chloride 0.9 % IVPB        8 mg/kg  83.9 kg 126 mL/hr over 30 Minutes Intravenous Daily 05/20/20 1544     05/19/20 2000  azithromycin (ZITHROMAX) 500 mg in sodium chloride 0.9 % 250 mL IVPB  Status:  Discontinued        500 mg 250 mL/hr over 60 Minutes Intravenous Every 24 hours 05/18/20 2114 05/20/20 0901   05/19/20 1800  Ampicillin-Sulbactam (UNASYN) 3 g in sodium chloride 0.9 % 100 mL IVPB        3 g 200 mL/hr over 30 Minutes Intravenous Every 6 hours 05/19/20 1712     05/19/20 0800  vancomycin (VANCOREADY) IVPB 750 mg/150 mL  Status:  Discontinued        750 mg 150 mL/hr over 60 Minutes Intravenous Every 12 hours 05/18/20 2056 05/20/20 1544   05/19/20 0400  ceFEPIme (MAXIPIME) 2 g in sodium chloride 0.9 % 100 mL IVPB  Status:  Discontinued        2 g 200 mL/hr over 30 Minutes Intravenous Every 8 hours 05/18/20 2056 05/19/20 1711   05/18/20 1845  vancomycin (VANCOREADY) IVPB 1500 mg/300 mL        1,500 mg 150 mL/hr over 120 Minutes Intravenous  Once 05/18/20 1844 05/18/20 2219   05/18/20 1845  ceFEPIme (MAXIPIME) 2 g in sodium chloride 0.9 % 100 mL IVPB        2 g 200 mL/hr over 30 Minutes Intravenous  Once 05/18/20 1844 05/18/20 2002   05/18/20 1845  azithromycin (ZITHROMAX) 500 mg in sodium chloride 0.9 % 250 mL IVPB        500 mg 250 mL/hr over 60 Minutes Intravenous  Once 05/18/20 1844 05/18/20 2142      Medications: Scheduled Meds: . vitamin C  500 mg Per Tube Daily  . baclofen  20 mg Per Tube QID  . buPROPion  75 mg Per Tube TID  . Chlorhexidine Gluconate Cloth  6 each Topical Daily  . dantrolene  50 mg Per Tube 4 times per day  . feeding supplement (PROSource TF)  90 mL Per Tube BID  . folic acid  1 mg Per Tube Daily  . free water  240 mL Per Tube Q4H  .  insulin aspart  0-15 Units Subcutaneous Q4H  . insulin detemir  20 Units Subcutaneous Daily  . levETIRAcetam  1,000 mg Per Tube TID  . multivitamin with minerals  1 tablet Per Tube Daily  . nutrition supplement (JUVEN)  1 packet Per Tube BID BM  . pantoprazole sodium  40 mg Per Tube Daily  . polyvinyl alcohol  1 drop Both Eyes BID  . vitamin B-12  500 mcg Per Tube Daily   Continuous Infusions: . sodium chloride 100 mL/hr at 05/22/20 0600  . ampicillin-sulbactam (UNASYN) IV 3 g (05/22/20 0631)  . DAPTOmycin (CUBICIN)  IV Stopped (05/21/20 2047)  . feeding supplement (JEVITY 1.5 CAL/FIBER) 1,000 mL (05/22/20 0930)  . potassium chloride 10 mEq (05/22/20 1042)   PRN Meds:.acetaminophen **OR** acetaminophen, diazepam, guaifenesin, HYDROcodone-acetaminophen, ipratropium-albuterol    Objective: Weight change:   Intake/Output Summary (Last 24 hours) at 05/22/2020 1206 Last data filed at 05/22/2020 1100 Gross per 24 hour  Intake 2309.75 ml  Output 5150  ml  Net -2840.25 ml   Blood pressure (!) 148/100, pulse (!) 113, temperature 99.4 F (37.4 C), temperature source Axillary, resp. rate (!) 24, height 6\' 4"  (1.93 m), weight 83.9 kg, SpO2 99 %. Temp:  [98.1 F (36.7 C)-99.4 F (37.4 C)] 99.4 F (37.4 C) (05/15 1143) Pulse Rate:  [101-113] 113 (05/15 0600) Resp:  [17-24] 24 (05/15 0600) BP: (143-154)/(89-100) 148/100 (05/15 0600) SpO2:  [97 %-99 %] 99 % (05/15 0600)  Physical Exam: Physical Exam Constitutional:      Appearance: He is ill-appearing.  HENT:     Head: Atraumatic.  Cardiovascular:     Rate and Rhythm: Tachycardia present.  Pulmonary:     Effort: No respiratory distress.     Breath sounds: Rhonchi present.  Abdominal:     General: Abdomen is flat. There is no distension.     Palpations: Abdomen is soft.   Patient is nonverbal with contractures.  Wounds not examined  CBC:    BMET Recent Labs    05/21/20 0125 05/22/20 0219  NA 136 137  K 3.8 3.2*  CL  106 106  CO2 19* 22  GLUCOSE 197* 216*  BUN 11 10  CREATININE 0.32* <0.30*  CALCIUM 8.4* 8.3*     Liver Panel  No results for input(s): PROT, ALBUMIN, AST, ALT, ALKPHOS, BILITOT, BILIDIR, IBILI in the last 72 hours.     Sedimentation Rate No results for input(s): ESRSEDRATE in the last 72 hours. C-Reactive Protein No results for input(s): CRP in the last 72 hours.  Micro Results: Recent Results (from the past 720 hour(s))  Blood Culture (routine x 2)     Status: Abnormal   Collection Time: 05/18/20  5:53 PM   Specimen: BLOOD  Result Value Ref Range Status   Specimen Description BLOOD SITE NOT SPECIFIED  Final   Special Requests   Final    BOTTLES DRAWN AEROBIC AND ANAEROBIC Blood Culture adequate volume   Culture  Setup Time   Final    GRAM POSITIVE COCCI IN CLUSTERS AEROBIC BOTTLE ONLY CRITICAL RESULT CALLED TO, READ BACK BY AND VERIFIED WITH: Salome Holmes PHARMD [id number] 05/19/20 A BROWNING    Culture (A)  Final    STAPHYLOCOCCUS HOMINIS THE SIGNIFICANCE OF ISOLATING THIS ORGANISM FROM A SINGLE SET OF BLOOD CULTURES WHEN MULTIPLE SETS ARE DRAWN IS UNCERTAIN. PLEASE NOTIFY THE MICROBIOLOGY DEPARTMENT WITHIN ONE WEEK IF SPECIATION AND SENSITIVITIES ARE REQUIRED. Performed at [facility], [place] [address] [place], [place] [address]    Report Status 05/20/2020 FINAL  Final  Blood Culture ID Panel (Reflexed)     Status: Abnormal   Collection Time: 05/18/20  5:53 PM  Result Value Ref Range Status   Enterococcus faecalis NOT DETECTED NOT DETECTED Final   Enterococcus Faecium NOT DETECTED NOT DETECTED Final   Listeria monocytogenes NOT DETECTED NOT DETECTED Final   Staphylococcus species DETECTED (A) NOT DETECTED Final    Comment: CRITICAL RESULT CALLED TO, READ BACK BY AND VERIFIED WITH: Salome Holmes PHARMD [id number] 05/19/20 A BROWNING    Staphylococcus aureus (BCID) NOT DETECTED NOT DETECTED Final   Staphylococcus epidermidis NOT DETECTED NOT DETECTED Final   Staphylococcus  lugdunensis NOT DETECTED NOT DETECTED Final   Streptococcus species NOT DETECTED NOT DETECTED Final   Streptococcus agalactiae NOT DETECTED NOT DETECTED Final   Streptococcus pneumoniae NOT DETECTED NOT DETECTED Final   Streptococcus pyogenes NOT DETECTED NOT DETECTED Final   A.calcoaceticus-baumannii NOT DETECTED NOT DETECTED Final   Bacteroides fragilis NOT  DETECTED NOT DETECTED Final   Enterobacterales NOT DETECTED NOT DETECTED Final   Enterobacter cloacae complex NOT DETECTED NOT DETECTED Final   Escherichia coli NOT DETECTED NOT DETECTED Final   Klebsiella aerogenes NOT DETECTED NOT DETECTED Final   Klebsiella oxytoca NOT DETECTED NOT DETECTED Final   Klebsiella pneumoniae NOT DETECTED NOT DETECTED Final   Proteus species NOT DETECTED NOT DETECTED Final   Salmonella species NOT DETECTED NOT DETECTED Final   Serratia marcescens NOT DETECTED NOT DETECTED Final   Haemophilus influenzae NOT DETECTED NOT DETECTED Final   Neisseria meningitidis NOT DETECTED NOT DETECTED Final   Pseudomonas aeruginosa NOT DETECTED NOT DETECTED Final   Stenotrophomonas maltophilia NOT DETECTED NOT DETECTED Final   Candida albicans NOT DETECTED NOT DETECTED Final   Candida auris NOT DETECTED NOT DETECTED Final   Candida glabrata NOT DETECTED NOT DETECTED Final   Candida krusei NOT DETECTED NOT DETECTED Final   Candida parapsilosis NOT DETECTED NOT DETECTED Final   Candida tropicalis NOT DETECTED NOT DETECTED Final   Cryptococcus neoformans/gattii NOT DETECTED NOT DETECTED Final    Comment: Performed at [facility], [place] [address], [place], [place] [address]  Blood Culture (routine x 2)     Status: None (Preliminary result)   Collection Time: 05/18/20  6:24 PM   Specimen: BLOOD  Result Value Ref Range Status   Specimen Description BLOOD SITE NOT SPECIFIED  Final   Special Requests   Final    BOTTLES DRAWN AEROBIC AND ANAEROBIC Blood Culture adequate volume   Culture   Final    NO GROWTH 4  DAYS Performed at [facility], [place] [address], [place], [place] [address]    Report Status PENDING  Incomplete  Resp Panel by RT-PCR (Flu A&B, Covid) Nasopharyngeal Swab     Status: None   Collection Time: 05/18/20  8:06 PM   Specimen: Nasopharyngeal Swab; Nasopharyngeal(NP) swabs in vial transport medium  Result Value Ref Range Status   SARS Coronavirus 2 by RT PCR NEGATIVE NEGATIVE Final    Comment: (NOTE) SARS-CoV-2 target nucleic acids are NOT DETECTED.  The SARS-CoV-2 RNA is generally detectable in upper respiratory specimens during the acute phase of infection. The lowest concentration of SARS-CoV-2 viral copies this assay can detect is 138 copies/mL. A negative result does not preclude SARS-Cov-2 infection and should not be used as the sole basis for treatment or other patient management decisions. A negative result may occur with  improper specimen collection/handling, submission of specimen other than nasopharyngeal swab, presence of viral mutation(s) within the areas targeted by this assay, and inadequate number of viral copies(<138 copies/mL). A negative result must be combined with clinical observations, patient history, and epidemiological information. The expected result is Negative.  Fact Sheet for Patients:  EntrepreneurPulse.com.au  Fact Sheet for Healthcare Providers:  IncredibleEmployment.be  This test is no t yet approved or cleared by the [place] FDA and  has been authorized for detection and/or diagnosis of SARS-CoV-2 by FDA under an Emergency Use Authorization (EUA). This EUA will remain  in effect (meaning this test can be used) for the duration of the COVID-19 declaration under Section 564(b)(1) of the Act, 21 U.S.C.section 360bbb-3(b)(1), unless the authorization is terminated  or revoked sooner.       Influenza A by PCR NEGATIVE NEGATIVE Final   Influenza B by PCR NEGATIVE NEGATIVE Final    Comment:  (NOTE) The Xpert Xpress SARS-CoV-2/FLU/RSV plus assay is intended as an aid in the diagnosis of  influenza from Nasopharyngeal swab specimens and should not be used as a sole basis for treatment. Nasal washings and aspirates are unacceptable for Xpert Xpress SARS-CoV-2/FLU/RSV testing.  Fact Sheet for Patients: EntrepreneurPulse.com.au  Fact Sheet for Healthcare Providers: IncredibleEmployment.be  This test is not yet approved or cleared by the [place] FDA and has been authorized for detection and/or diagnosis of SARS-CoV-2 by FDA under an Emergency Use Authorization (EUA). This EUA will remain in effect (meaning this test can be used) for the duration of the COVID-19 declaration under Section 564(b)(1) of the Act, 21 U.S.C. section 360bbb-3(b)(1), unless the authorization is terminated or revoked.  Performed at [facility], [place] [address], [place], [place] [address]   C Difficile Quick Screen w PCR reflex     Status: None   Collection Time: 05/18/20 10:28 PM   Specimen: STOOL  Result Value Ref Range Status   C Diff antigen NEGATIVE NEGATIVE Final   C Diff toxin NEGATIVE NEGATIVE Final   C Diff interpretation No C. difficile detected.  Final    Comment: Performed at [facility], [place] [address], [place], [place] [address]  Gastrointestinal Panel by PCR , Stool     Status: None   Collection Time: 05/18/20 10:28 PM   Specimen: STOOL  Result Value Ref Range Status   Campylobacter species NOT DETECTED NOT DETECTED Final   Plesimonas shigelloides NOT DETECTED NOT DETECTED Final   Salmonella species NOT DETECTED NOT DETECTED Final   Yersinia enterocolitica NOT DETECTED NOT DETECTED Final   Vibrio species NOT DETECTED NOT DETECTED Final   Vibrio cholerae NOT DETECTED NOT DETECTED Final   Enteroaggregative E coli (EAEC) NOT DETECTED NOT DETECTED Final   Enteropathogenic E coli (EPEC) NOT DETECTED NOT DETECTED Final    Enterotoxigenic E coli (ETEC) NOT DETECTED NOT DETECTED Final   Shiga like toxin producing E coli (STEC) NOT DETECTED NOT DETECTED Final   Shigella/Enteroinvasive E coli (EIEC) NOT DETECTED NOT DETECTED Final   Cryptosporidium NOT DETECTED NOT DETECTED Final   Cyclospora cayetanensis NOT DETECTED NOT DETECTED Final   Entamoeba histolytica NOT DETECTED NOT DETECTED Final   Giardia lamblia NOT DETECTED NOT DETECTED Final   Adenovirus F40/41 NOT DETECTED NOT DETECTED Final   Astrovirus NOT DETECTED NOT DETECTED Final   Norovirus GI/GII NOT DETECTED NOT DETECTED Final   Rotavirus A NOT DETECTED NOT DETECTED Final   Sapovirus (I, II, IV, and V) NOT DETECTED NOT DETECTED Final    Comment: Performed at [facility], [address] [place], [place] [address]    Studies/Results: [place] CHEST PORT 1 VIEW  Result Date: 05/22/2020 CLINICAL DATA:  Hypoxia. EXAM: PORTABLE CHEST 1 VIEW COMPARISON:  05/18/2020 FINDINGS: Lung volumes are low. Heart size and mediastinal contours are unremarkable. Atelectasis is identified within the left base. No pleural effusion or interstitial edema identified. No airspace consolidation. IMPRESSION: Low lung volumes and left base atelectasis. Electronically Signed   By: Kerby Moors M.D.   On: 05/22/2020 05:22   ECHOCARDIOGRAM COMPLETE  Result Date: 05/20/2020    ECHOCARDIOGRAM REPORT   Patient Name:   Terry Palmer Date of Exam: 05/20/2020 Medical Rec #:  [id number]        Height:       76.0 in Accession #:    [id number]       Weight:       185.0 lb Date of Birth:  [birth year]       BSA:  2.143 m Patient Age:    86 years         BP:           142/91 mmHg Patient Gender: M                HR:           119 bpm. Exam Location:  Inpatient Procedure: 2D Echo Indications:    Ventricular Tachycardia  History:        Patient has prior history of Echocardiogram examinations, most                 recent 08/09/2005. Multiple sclerosis. Sepsis.  Sonographer:    Johny Chess Referring Phys: [id number] [facility] [place]  1. Left ventricular ejection fraction, by estimation, is 55%. The left ventricle has normal function. The left ventricle has no regional wall motion abnormalities. Left ventricular diastolic parameters are consistent with Grade I diastolic dysfunction (impaired relaxation).  2. Right ventricular systolic function is normal. The right ventricular size is normal. Tricuspid regurgitation signal is inadequate for assessing PA pressure.  3. The mitral valve is normal in structure. No evidence of mitral valve regurgitation. No evidence of mitral stenosis.  4. The aortic valve is tricuspid. Aortic valve regurgitation is not visualized. No aortic stenosis is present.  5. The IVC was not visualized. FINDINGS  Left Ventricle: Left ventricular ejection fraction, by estimation, is 55%. The left ventricle has normal function. The left ventricle has no regional wall motion abnormalities. The left ventricular internal cavity size was normal in size. There is no left ventricular hypertrophy. Left ventricular diastolic parameters are consistent with Grade I diastolic dysfunction (impaired relaxation). Right Ventricle: The right ventricular size is normal. No increase in right ventricular wall thickness. Right ventricular systolic function is normal. Tricuspid regurgitation signal is inadequate for assessing PA pressure. Left Atrium: Left atrial size was normal in size. Right Atrium: Right atrial size was normal in size. Pericardium: There is no evidence of pericardial effusion. Mitral Valve: The mitral valve is normal in structure. No evidence of mitral valve regurgitation. No evidence of mitral valve stenosis. Tricuspid Valve: The tricuspid valve is normal in structure. Tricuspid valve regurgitation is not demonstrated. Aortic Valve: The aortic valve is tricuspid. Aortic valve regurgitation is not visualized. No aortic stenosis is present. Pulmonic Valve: The pulmonic  valve was normal in structure. Pulmonic valve regurgitation is not visualized. Aorta: The aortic root is normal in size and structure. Venous: The inferior vena cava was not well visualized. IAS/Shunts: No atrial level shunt detected by color flow Doppler.  LEFT VENTRICLE PLAX 2D LVIDd:         4.80 cm     Diastology LVIDs:         3.30 cm     LV e' medial: 7.62 cm/s LV PW:         0.90 cm LV IVS:        0.80 cm LVOT diam:     2.50 cm LV SV:         67 LV SV Index:   31 LVOT Area:     4.91 cm  LV Volumes (MOD) LV vol d, MOD A2C: 85.2 ml LV vol d, MOD A4C: 80.3 ml LV vol s, MOD A2C: 48.6 ml LV vol s, MOD A4C: 45.8 ml LV SV MOD A2C:     36.6 ml LV SV MOD A4C:     80.3 ml LV SV MOD BP:  36.9 ml RIGHT VENTRICLE RV S prime:     18.20 cm/s TAPSE (M-mode): 1.7 cm LEFT ATRIUM           Index       RIGHT ATRIUM          Index LA diam:      2.30 cm 1.07 cm/m  RA Area:     9.01 cm LA Vol (A2C): 33.5 ml 15.63 ml/m RA Volume:   14.50 ml 6.77 ml/m LA Vol (A4C): 31.2 ml 14.56 ml/m  AORTIC VALVE LVOT Vmax:   91.40 cm/s LVOT Vmean:  58.900 cm/s LVOT VTI:    0.137 m  AORTA Ao Root diam: 3.10 cm  SHUNTS Systemic VTI:  0.14 m Systemic Diam: 2.50 cm Loralie Champagne MD Electronically signed by Loralie Champagne MD Signature Date/Time: 05/20/2020/4:18:51 PM    Final       Assessment/Plan:  INTERVAL HISTORY: Patient was moved to the ICU due to concerns about his respiratory status and apparently the fact that he was having sonorous aspirations and becoming hypoxic.  He also was becoming less responsive.  By the time he was moved to the ICU critical care saw him and he had become responsive though still nonverbal he is being worked up for seizures   Principal Problem:   Sepsis ([place]) Active Problems:   Multiple sclerosis ([place])   UTI (urinary tract infection)   CAP (community acquired pneumonia)   Osteomyelitis ([place])   Pressure injury of skin    Terry Palmer is a 45 y.o. 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  He is currently on vancomycin and Unasyn.  #1 ischial osteomyelitis:   We are going to be aggressive (which I actually think is the wrong approach) we will give him 6 weeks of vancomycin (or daptomycin if he goes home and this is covered) along with Unasyn.  #2 aspiration pneumonia: Covered with Unasyn  #3 change in his level of responsiveness likely multifactorial but being worked up for seizures  #4 goals of care: I would strongly encourage pivot to pure palliative care.  We will sign off for now please call back as he nears discharge   I spent greater than 35 minutes with the patient including greater than 50% of time in face to face counsel of the patient personal review of his x-rays radiographs, review of his laboratory and microbiology data and in coordination of his care.   LOS: 4 days   Alcide Evener 05/22/2020, 12:06 PM

## 2020-05-22 NOTE — Progress Notes (Signed)
Upon changing patient, writer noted that patient has continuous loud snores.V/S BP149/90 T98.5 PR101 RR17. 02sat 92%RA. HOB was elevated and initiated 3L/02-went up to 96-97%. Called CN to assess patient as well. Patient's pupil was sluggish, he doesn't respond to painful stimuli and has no gag reflex. Rapid Response & on call TRH( Hall,MD) was paged. Both came in to assess patient. & PCCM MD was paged afterwards. Patient was then transferred to 2M02 for close monitoring. Report given to 2MRN.

## 2020-05-22 NOTE — Progress Notes (Signed)
EEG complete - results pending 

## 2020-05-22 NOTE — Consult Note (Signed)
NAME:  Terry Palmer, MRN:  [id number], DOB:  June 13, 1975, LOS: 4 ADMISSION DATE:  05/18/2020, CONSULTATION DATE: 06/01/2020 REFERRING MD: Dr. Eddie Dibbles CHIEF COMPLAINT: Unresponsive  History of Present Illness:  This is a 45 year old male that has been admitted to medical floor for sepsis related to community acquired pneumonia and osteomyelitis.  Patient's baseline is bedbound and contracted.  Does not generally interact with his surroundings and was dysphasic.  This morning patient became even less responsive and was unable to protect his airway.  He had developed sonorous respirations and became hypoxic.  On my arrival, the patient became more responsive and eyes were wandering.  He has gag and cough that started to improve.  He no longer had sonorous respirations.  No overt seizure activity seen but this may represent postictal state.  Pertinent  Medical History  Multiple sclerosis Diabetes mellitus type II Seizure disorder Malignant hyperthermia Aspiration pneumonia Dementia related multiple sclerosis   Significant Hospital Events: Including procedures, antibiotic start and stop dates in addition to other pertinent events   .    Objective   Blood pressure (!) 149/90, pulse (!) 101, temperature 98.5 F (36.9 C), temperature source Axillary, resp. rate 17, height 6\' 4"  (1.93 m), weight 83.9 kg, SpO2 98 %.        Intake/Output Summary (Last 24 hours) at 05/22/2020 0603 Last data filed at 05/22/2020 0500 Gross per 24 hour  Intake 4924 ml  Output 4550 ml  Net 374 ml   Filed Weights   05/18/20 2157 05/18/20 2246  Weight: 83.9 kg 83.9 kg    Examination: General: No acute distress HENT: Atraumatic/normocephalic mucous membranes are moist Lungs: Bilateral rales mild.  No wheezing or rales Cardiovascular: Regular rate Abdomen: Soft, nondistended, positive bowel sounds Extremities: Very contracted extremities. distal pulse intact x4.  No significant edema. Neuro: Eyes are open  but not tracking.  He is in a chronic contracted state.  He currently has a cough and a gag but is extremely weak   Labs/imaging that I havepersonally reviewed  (right click and "Reselect all SmartList Selections" daily)     Assessment & Plan:  ALOC Seizure with postictal state Multiple sclerosis Community-acquired pneumonia Osteomyelitis Diarrhea Diabetes mellitus type 2  Plan: We will transfer patient to the intensive care unit for further monitoring. Patient seems to be returning back to his limited baseline.  Currently able to protect his airway and does not require intubation. Due to the patient's very poor baseline functional status and limited ability to protect his airway we will transfer to the intensive better neurologic evaluation. EEG is pending. Continue current medical therapy   Best practice (right click and "Reselect all SmartList Selections" daily)  Diet:  Tube Feed  Pain/Anxiety/Delirium protocol (if indicated): No VAP protocol (if indicated): Not indicated DVT prophylaxis: SCD GI prophylaxis: PPI Glucose control:  SSI Yes Central venous access:  N/A Arterial line:  N/A Foley:  Yes, and it is still needed Mobility:  bed rest  PT consulted: N/A Last date of multidisciplinary goals of care discussion [ Code Status:  full code Disposition: Transfer to ICU  Labs   CBC: Recent Labs  Lab 05/18/20 1804 05/18/20 1811 05/19/20 0201 05/20/20 0149 05/21/20 0125 05/22/20 0219  WBC 19.3*  --  17.1* 16.8* 17.5* 15.1*  NEUTROABS 15.0*  --   --  12.0* 11.9* 10.4*  HGB 12.1* 12.9* 11.6* 11.1* 11.4* 10.5*  HCT 37.9* 38.0* 36.2* 34.9* 36.3* 32.7*  MCV 91.1  --  92.1 91.8  91.7 91.3  PLT 549*  --  499* 478* 437* 472*    Basic Metabolic Panel: Recent Labs  Lab 05/18/20 1804 05/18/20 1811 05/19/20 0201 05/20/20 0149 05/21/20 0125 05/22/20 0219  NA 133* 135 135 134* 136 137  K 4.1 4.1 3.8 3.3* 3.8 3.2*  CL 100 100 101 104 106 106  CO2 23  --  23 21*  19* 22  GLUCOSE 218* 222* 140* 253* 197* 216*  BUN 9 9 6 6 11 10   CREATININE 0.40* 0.30* <0.30* 0.40* 0.32* <0.30*  CALCIUM 9.0  --  8.9 8.2* 8.4* 8.3*  MG  --   --   --  2.1  --   --    GFR: CrCl cannot be calculated (This lab value cannot be used to calculate CrCl because it is not a number: <0.30). Recent Labs  Lab 05/18/20 1804 05/18/20 1953 05/19/20 0201 05/20/20 0149 05/21/20 0125 05/22/20 0219  WBC 19.3*  --  17.1* 16.8* 17.5* 15.1*  LATICACIDVEN 1.4 1.3  --   --   --   --     Liver Function Tests: Recent Labs  Lab 05/18/20 1804  AST 26  ALT 25  ALKPHOS 107  BILITOT 0.2*  PROT 7.6  ALBUMIN 2.7*   Recent Labs  Lab 05/18/20 1804  LIPASE 28   No results for input(s): AMMONIA in the last 168 hours.  ABG    Component Value Date/Time   PHART 7.409 05/22/2020 0528   PCO2ART 38.7 05/22/2020 0528   PO2ART 349 (H) 05/22/2020 0528   HCO3 24.0 05/22/2020 0528   TCO2 24 05/18/2020 1811   ACIDBASEDEF 0.1 05/22/2020 0528   O2SAT 99.8 05/22/2020 0528     Coagulation Profile: Recent Labs  Lab 05/18/20 1804  INR 1.1    Cardiac Enzymes: Recent Labs  Lab 05/20/20 1529  CKTOTAL 66    HbA1C: Hgb A1c MFr Bld  Date/Time Value Ref Range Status  05/20/2020 08:27 AM 8.6 (H) 4.8 - 5.6 % Final    Comment:    (NOTE) Pre diabetes:          5.7%-6.4%  Diabetes:              >6.4%  Glycemic control for   <7.0% adults with diabetes     CBG: Recent Labs  Lab 05/21/20 2008 05/22/20 0020 05/22/20 0344 05/22/20 0423 05/22/20 0552  GLUCAP 261* 209* 205* 213* 175*    Review of Systems:   Unable to complete secondary to patient's neurologic condition.  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   Surgical History:   Past Surgical History:  Procedure Laterality Date  . GASTROSTOMY  04/16/2011   Procedure: GASTROSTOMY;  Surgeon: Zenovia Jarred, MD;  Location: [facility];  Service: General;  Laterality: N/A;  Open G-Tube placement  . LUMBAR PUNCTURE  10/12/2002  . SUPRAPUBIC CYSTOSTOMY       Social History:   reports that he quit smoking about 21 years ago. His smoking use included cigarettes. He has quit using smokeless tobacco. He reports that he does not drink alcohol.   Family History:  His family history includes  Asthma in his mother.   Allergies No Known Allergies   Home Medications  Prior to Admission medications   Medication Sig Start Date End Date Taking? Authorizing Provider  acetaminophen (TYLENOL) 500 MG tablet Take 1 tablet (500 mg total) by mouth every 8 (eight) hours as needed for fever (or pain). Patient taking differently: Take 500 mg by mouth 2 (two) times daily. 03/05/15  Yes Debbe Odea, MD  AZO-CRANBERRY PO Take 1 tablet by mouth 2 (two) times daily.   Yes [provider]  baclofen (LIORESAL) 20 MG tablet Place 1 tablet (20 mg total) into feeding tube 4 (four) times daily. 07/13/15  Yes Kathrynn Ducking, MD  bisacodyl (DULCOLAX) 10 MG suppository Place 10 mg rectally once as needed for moderate constipation.   Yes [provider]  buPROPion (WELLBUTRIN) 75 MG tablet Place 1 tablet (75 mg total) into feeding tube 3 (three) times daily. 01/24/13  Yes Bonnielee Haff, MD  Cobalamin Combinations (OPURITY B12/FOLIC ACID) A999333 MCG TABS Take 0.5 tablets by mouth daily.   Yes [provider]  dantrolene (DANTRIUM) 50 MG capsule Give 50 mg by tube 4 (four) times daily. At 0800, 1200,  1600, 2000   Yes [provider]  diazepam (VALIUM) 5 MG tablet Place 5 mg into feeding tube at bedtime as needed for anxiety (sleep).    Yes [provider]  guaifenesin (ROBITUSSIN) 100 MG/5ML syrup Take 200 mg by mouth 3 (three) times daily as needed for cough.   Yes [provider]  HYDROcodone-acetaminophen (NORCO) 7.5-325 MG per tablet Place 1 tablet into feeding tube every 6 (six) hours as needed (pain).    Yes [provider]  ipratropium-albuterol (DUONEB) 0.5-2.5 (3) MG/3ML SOLN Take 3 mLs by nebulization See admin instructions. Four times daily and as needed for shortness of breath   Yes [provider]  levETIRAcetam (KEPPRA) 100 MG/ML solution Take 1,200 mg by mouth in the morning, at noon, and at bedtime. Wife gives differently:  38mL TID (= 1000mg  per dose)   Yes [provider]  Multiple Vitamin (MULTIVITAMIN WITH MINERALS) TABS tablet Place 1 tablet into feeding tube daily. 01/24/13  Yes Bonnielee Haff, MD  Nutritional Supplements (FEEDING SUPPLEMENT, JEVITY 1.5 CAL,) LIQD Place 1,000 mLs into feeding tube See admin instructions. 88mL/1hr over 12 hrs   Yes [provider]  omeprazole (PRILOSEC) 2 mg/mL SUSP Place 20 mLs (40 mg total) into feeding tube daily. 01/24/13  Yes Bonnielee Haff, MD  polyethylene glycol [facility] / GLYCOLAX) packet Place 17 g into feeding tube daily. Patient taking differently: Place 17 g into feeding tube every evening. 01/24/13  Yes Bonnielee Haff, MD  polyvinyl alcohol (LIQUIFILM TEARS) 1.4 % ophthalmic solution Place 1 drop into both eyes 3 (three) times daily. Patient taking differently: Place 1 drop into both eyes 2 (two) times daily. 01/24/13  Yes Bonnielee Haff, MD  tiZANidine (ZANAFLEX) 2 MG tablet Take 1 tablet (2 mg total) by mouth every 6 (six) hours as needed for muscle spasms. Patient taking differently: Place 2 mg into feeding tube 3 (three) times daily. 11/25/15  Yes Kathrynn Ducking, MD  vitamin C (ASCORBIC ACID) 500 MG tablet Place 500 mg into feeding tube daily.    Yes [provider]  Water For Irrigation, Sterile (FREE WATER) SOLN Place 240 mLs into feeding tube every 4 (four) hours. 01/24/13  Yes Bonnielee Haff, MD  famotidine (PEPCID) 40 MG/5ML suspension Take 20 mg by mouth daily. 04/28/20  [provider]     Critical care time: 45 minutes

## 2020-05-22 NOTE — Significant Event (Signed)
Rapid Response Event Note   Reason for Call :  Mental status change. Pt is non-verbal and responds to verbal stimulation with his eyes per charting.  Initial Focused Assessment:  Pt lying in bed, snoring, with no gag reflex. Pt does resist eye opening but that's the only thing he's responsive to. Pupils 3, equal, and sluggish. Lungs rhonchi t/o, however, it is difficult to hear over his snoring respirations. ABD large and distended, soft.  Skin warm and diaphoretic.  T-98.5, HR-102, BP-149/90, RR-14, SpO2-98% on 3L [place], CBG-213.   Interventions:  TF turned off NRB ABG-7.40/38.7/349/24 PCXR-low lung volumes and L bases atelectasis EKG-ST PCCM consult-pt txed to 2M02  Plan of Care:  Tx to 2M02  Event Summary:   MD Notified: Dr. Nevada Crane notified and came to bedside, Dr. Dorothy(PCCM MD) consulted and came to beside Call [place] [id number]  Dillard Essex, RN

## 2020-05-22 NOTE — Progress Notes (Signed)
Received a call from bedside RN regarding patient being difficult to arouse with no gag reflex.  Came to bedside, patient does not respond to painful stimuli.  Tube feedings held.  Placed on nonrebreather.  Due to concern for inability to protect his airway PCCM was consulted.  Ordered stat chest x-ray, ABG and EEG.  Patient has a history of seizure and is on Keppra.  Appreciate PCCM's assistance.

## 2020-05-22 NOTE — Progress Notes (Signed)
PROGRESS NOTE    Terry Palmer  [id number] DOB: Nov 08, 1975 DOA: 05/18/2020 PCP: [name], MD   Brief Narrative:  Terry Palmer is a 45 y.o. male with medical history significant of advanced multiple sclerosis, nonverbal at baseline, quadriplegic with contractures, neurogenic bladder with chronic suprapubic catheter, dysphagia with chronic indwelling PEG tube, dementia, history of seizures presented to the ED via EMS for evaluation of fevers and decubitus ulcer getting worse.  Wife states he has a chronic sacral wound which has been worsening for since March with more drainage.   In the ED, afebrile. Tachycardic with heart rate up to 120s and slightly tachypneic.  Not hypotensive.  Not hypoxic.  Labs showing WBC 19.3, hemoglobin 12.1 (stable compared to prior labs), platelet count 549K.  UA with negative nitrite, large amount of leukocytes, greater than 50 WBCs, and few bacteria.  Urine culture pending.  COVID and influenza panel negative.  Chest x-ray showing left lung base opacity suspicious for pneumonia.  Patient noted to have a large sacral wound.  CT showing significant progression and soft tissue wound overlying the left ischial tuberosity with underlying bony erosive changes consistent with osteomyelitis. Patient was given vancomycin, cefepime, and azithromycin and admitted under hospitalist service.  General surgery consulted who recommended hydrotherapy and then ID was consulted.  Assessment & Plan:   Principal Problem:   Sepsis ([place]) Active Problems:   Multiple sclerosis ([place])   UTI (urinary tract infection)   CAP (community acquired pneumonia)   Osteomyelitis ([place])   Pressure injury of skin  Sepsis Secondary to large sacral wound with signs of osteomyelitis/aspiration pneumonia: CT showing significant progression and soft tissue wound overlying the left ischial tuberosity with underlying bony erosive changes consistent with osteomyelitis.General surgery consulted.   They did not recommend any intervention.  They have ordered hydrotherapy through PT. Last time he was febrile was around 3 PM yesterday with a temperature of 101.3.  ID on board they recommend 6 weeks of IV antibiotics combined Zosyn and daptomycin.  Patient still remains tachycardic persistently but improved.  Acute change in mental status: Events from last night noted where patient was noted to have acute change in mental status and less responsiveness.  He was moved to ICU as he was at risk of not being able to protect his airway needing intubation however fortunately he did not require 1.  He was doing well this morning, eyes open but nonverbal and not responding to anything, at his baseline.  EEG is done but results pending.  Type 2 diabetes mellitus: Due to hyperglycemia on the BMP, hemoglobin A1c was checked and it is 8.6.  Patient is now diagnosed with type 2 diabetes mellitus.  No prior history of diabetes.  Blood sugar controlled.  Continue Lantus 20 units and SSI.  Bacteremia vs contamination: Only 1 of 4 bottles is growing Staphylococcus.  Likely contamination.  Final identification pending.  Continue above antibiotics.  Hypokalemia: Down again.  Will replace  Diarrhea: Likely related to recent antibiotic use.  He was also receiving Dulcolax and MiraLAX at home.  Abdominal exam benign.  C. difficile negative.  GI pathogen panel negative  Advanced underlying MS causing functional quadriplegia, dysphagia with chronic indwelling PEG, neurogenic bladder with chronic suprapubic catheter -Continue supportive care  Seizure disorder -Continue Keppra  Goal of care: Discussed with wife yesterday about thinking about CODE STATUS.  She wanted to continue his full code.  I have consulted palliative care yesterday to have goals of care conversation  with wife as patient has poor prognosis and long-term and perhaps in short-term as well.  DVT prophylaxis: SCDs Start: 05/18/20 2108   Code Status:  Full Code  Family Communication: None present at bedside.  Discussed with his wife over the phone.  Status is: Inpatient  Remains inpatient appropriate because:Inpatient level of care appropriate due to severity of illness   Dispo: The patient is from: Home              Anticipated d/c is to: Home              Patient currently is not medically stable to d/c.   Difficult to place patient No        Estimated body mass index is 22.52 kg/m as calculated from the following:   Height as of this encounter: 6\' 4"  (1.93 m).   Weight as of this encounter: 83.9 kg.  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 (Active)  05/20/20 1346  Location: Ischial tuberosity  Location Orientation: Left  Staging: Stage 4 - Full thickness tissue loss with exposed bone, tendon or muscle.  Wound Description (Comments):   Present on Admission: Yes     Nutritional status:  Nutrition Problem: Increased nutrient needs Etiology: wound healing   Signs/Symptoms: estimated needs   Interventions: Refer to RD note for recommendations,Tube feeding,MVI    Consultants:   ID  General surgery  PCCM  Procedures:   None  Antimicrobials:  Anti-infectives (From admission, onward)   Start     Dose/Rate Route Frequency Ordered Stop   05/20/20 2000  DAPTOmycin (CUBICIN) 650 mg in sodium chloride 0.9 % IVPB        8 mg/kg  83.9 kg 126 mL/hr over 30 Minutes Intravenous Daily 05/20/20 1544     05/19/20 2000  azithromycin (ZITHROMAX) 500 mg in sodium chloride 0.9 % 250 mL IVPB  Status:  Discontinued        500 mg 250 mL/hr over 60 Minutes Intravenous Every 24 hours 05/18/20 2114 05/20/20 0901   05/19/20 1800  Ampicillin-Sulbactam (UNASYN) 3 g in sodium chloride 0.9 % 100 mL IVPB        3 g 200 mL/hr over 30 Minutes Intravenous Every 6 hours 05/19/20 1712     05/19/20 0800  vancomycin (VANCOREADY) IVPB 750 mg/150 mL   Status:  Discontinued        750 mg 150 mL/hr over 60 Minutes Intravenous Every 12 hours 05/18/20 2056 05/20/20 1544   05/19/20 0400  ceFEPIme (MAXIPIME) 2 g in sodium chloride 0.9 % 100 mL IVPB  Status:  Discontinued        2 g 200 mL/hr over 30 Minutes Intravenous Every 8 hours 05/18/20 2056 05/19/20 1711   05/18/20 1845  vancomycin (VANCOREADY) IVPB 1500 mg/300 mL        1,500 mg 150 mL/hr over 120 Minutes Intravenous  Once 05/18/20 1844 05/18/20 2219   05/18/20 1845  ceFEPIme (MAXIPIME) 2 g in sodium chloride 0.9 % 100 mL IVPB        2 g 200 mL/hr over 30 Minutes Intravenous  Once 05/18/20 1844 05/18/20 2002   05/18/20 1845  azithromycin (ZITHROMAX) 500 mg in sodium chloride 0.9 % 250 mL IVPB        500 mg 250 mL/hr over 60 Minutes Intravenous  Once 05/18/20 1844 05/18/20 2142         Subjective: Seen and examined, now  in ICU.  Eyes open and awake but nonverbal and not responding, at his baseline.  Objective: Vitals:   05/22/20 0600 05/22/20 0621 05/22/20 0736 05/22/20 1143  BP: (!) 148/100     Pulse: (!) 113     Resp: (!) 24     Temp:  98.1 F (36.7 C) 99.2 F (37.3 C) 99.4 F (37.4 C)  TempSrc:  Axillary Axillary Axillary  SpO2: 99%     Weight:      Height:        Intake/Output Summary (Last 24 hours) at 05/22/2020 1318 Last data filed at 05/22/2020 1100 Gross per 24 hour  Intake 2309.75 ml  Output 4300 ml  Net -1990.25 ml   Filed Weights   05/18/20 2157 05/18/20 2246  Weight: 83.9 kg 83.9 kg    Examination: General exam: Appears calm and comfortable  Respiratory system: Clear to auscultation. Respiratory effort normal. Cardiovascular system: S1 & S2 heard, RRR. No JVD, murmurs, rubs, gallops or clicks. No pedal edema. Gastrointestinal system: Abdomen is slightly distended, positive ventral hernia which is reducible, soft and nontender. No organomegaly or masses felt. Normal bowel sounds heard. Central nervous system: Awake but not oriented.  Nonverbal.   Contractures in all 4 extremities.  Data Reviewed: I have personally reviewed following labs and imaging studies  CBC: Recent Labs  Lab 05/18/20 1804 05/18/20 1811 05/19/20 0201 05/20/20 0149 05/21/20 0125 05/22/20 0219  WBC 19.3*  --  17.1* 16.8* 17.5* 15.1*  NEUTROABS 15.0*  --   --  12.0* 11.9* 10.4*  HGB 12.1* 12.9* 11.6* 11.1* 11.4* 10.5*  HCT 37.9* 38.0* 36.2* 34.9* 36.3* 32.7*  MCV 91.1  --  92.1 91.8 91.7 91.3  PLT 549*  --  499* 478* 437* [id number]*   Basic Metabolic Panel: Recent Labs  Lab 05/18/20 1804 05/18/20 1811 05/19/20 0201 05/20/20 0149 05/21/20 0125 05/22/20 0219  NA 133* 135 135 134* 136 137  K 4.1 4.1 3.8 3.3* 3.8 3.2*  CL 100 100 101 104 106 106  CO2 23  --  23 21* 19* 22  GLUCOSE 218* 222* 140* 253* 197* 216*  BUN 9 9 6 6 11 10   CREATININE 0.40* 0.30* <0.30* 0.40* 0.32* <0.30*  CALCIUM 9.0  --  8.9 8.2* 8.4* 8.3*  MG  --   --   --  2.1  --   --    GFR: CrCl cannot be calculated (This lab value cannot be used to calculate CrCl because it is not a number: <0.30). Liver Function Tests: Recent Labs  Lab 05/18/20 1804  AST 26  ALT 25  ALKPHOS 107  BILITOT 0.2*  PROT 7.6  ALBUMIN 2.7*   Recent Labs  Lab 05/18/20 1804  LIPASE 28   No results for input(s): AMMONIA in the last 168 hours. Coagulation Profile: Recent Labs  Lab 05/18/20 1804  INR 1.1   Cardiac Enzymes: Recent Labs  Lab 05/20/20 1529  CKTOTAL 66   BNP (last 3 results) No results for input(s): PROBNP in the last 8760 hours. HbA1C: Recent Labs    05/20/20 0827  HGBA1C 8.6*   CBG: Recent Labs  Lab 05/22/20 0344 05/22/20 0423 05/22/20 0552 05/22/20 0730 05/22/20 1140  GLUCAP 205* 213* 175* 153* 155*   Lipid Profile: No results for input(s): CHOL, HDL, LDLCALC, TRIG, CHOLHDL, LDLDIRECT in the last 72 hours. Thyroid Function Tests: No results for input(s): TSH, T4TOTAL, FREET4, T3FREE, THYROIDAB in the last 72 hours. Anemia Panel: No results for input(s):  VITAMINB12,  FOLATE, FERRITIN, TIBC, IRON, RETICCTPCT in the last 72 hours. Sepsis Labs: Recent Labs  Lab 05/18/20 1804 05/18/20 1953  LATICACIDVEN 1.4 1.3    Recent Results (from the past 240 hour(s))  Blood Culture (routine x 2)     Status: Abnormal   Collection Time: 05/18/20  5:53 PM   Specimen: BLOOD  Result Value Ref Range Status   Specimen Description BLOOD SITE NOT SPECIFIED  Final   Special Requests   Final    BOTTLES DRAWN AEROBIC AND ANAEROBIC Blood Culture adequate volume   Culture  Setup Time   Final    GRAM POSITIVE COCCI IN CLUSTERS AEROBIC BOTTLE ONLY CRITICAL RESULT CALLED TO, READ BACK BY AND VERIFIED WITH: Salome Holmes PHARMD [id number] 05/19/20 A BROWNING    Culture (A)  Final    STAPHYLOCOCCUS HOMINIS THE SIGNIFICANCE OF ISOLATING THIS ORGANISM FROM A SINGLE SET OF BLOOD CULTURES WHEN MULTIPLE SETS ARE DRAWN IS UNCERTAIN. PLEASE NOTIFY THE MICROBIOLOGY DEPARTMENT WITHIN ONE WEEK IF SPECIATION AND SENSITIVITIES ARE REQUIRED. Performed at [facility], [place] [address], [place], [place] [address]    Report Status 05/20/2020 FINAL  Final  Blood Culture ID Panel (Reflexed)     Status: Abnormal   Collection Time: 05/18/20  5:53 PM  Result Value Ref Range Status   Enterococcus faecalis NOT DETECTED NOT DETECTED Final   Enterococcus Faecium NOT DETECTED NOT DETECTED Final   Listeria monocytogenes NOT DETECTED NOT DETECTED Final   Staphylococcus species DETECTED (A) NOT DETECTED Final    Comment: CRITICAL RESULT CALLED TO, READ BACK BY AND VERIFIED WITH: Salome Holmes PHARMD 1914 05/19/20 A BROWNING    Staphylococcus aureus (BCID) NOT DETECTED NOT DETECTED Final   Staphylococcus epidermidis NOT DETECTED NOT DETECTED Final   Staphylococcus lugdunensis NOT DETECTED NOT DETECTED Final   Streptococcus species NOT DETECTED NOT DETECTED Final   Streptococcus agalactiae NOT DETECTED NOT DETECTED Final   Streptococcus pneumoniae NOT DETECTED NOT DETECTED Final   Streptococcus  pyogenes NOT DETECTED NOT DETECTED Final   A.calcoaceticus-baumannii NOT DETECTED NOT DETECTED Final   Bacteroides fragilis NOT DETECTED NOT DETECTED Final   Enterobacterales NOT DETECTED NOT DETECTED Final   Enterobacter cloacae complex NOT DETECTED NOT DETECTED Final   Escherichia coli NOT DETECTED NOT DETECTED Final   Klebsiella aerogenes NOT DETECTED NOT DETECTED Final   Klebsiella oxytoca NOT DETECTED NOT DETECTED Final   Klebsiella pneumoniae NOT DETECTED NOT DETECTED Final   Proteus species NOT DETECTED NOT DETECTED Final   Salmonella species NOT DETECTED NOT DETECTED Final   Serratia marcescens NOT DETECTED NOT DETECTED Final   Haemophilus influenzae NOT DETECTED NOT DETECTED Final   Neisseria meningitidis NOT DETECTED NOT DETECTED Final   Pseudomonas aeruginosa NOT DETECTED NOT DETECTED Final   Stenotrophomonas maltophilia NOT DETECTED NOT DETECTED Final   Candida albicans NOT DETECTED NOT DETECTED Final   Candida auris NOT DETECTED NOT DETECTED Final   Candida glabrata NOT DETECTED NOT DETECTED Final   Candida krusei NOT DETECTED NOT DETECTED Final   Candida parapsilosis NOT DETECTED NOT DETECTED Final   Candida tropicalis NOT DETECTED NOT DETECTED Final   Cryptococcus neoformans/gattii NOT DETECTED NOT DETECTED Final    Comment: Performed at [facility], [address], [place], [place] [address]  Blood Culture (routine x 2)     Status: None (Preliminary result)   Collection Time: 05/18/20  6:24 PM   Specimen: BLOOD  Result Value Ref Range Status   Specimen Description BLOOD SITE NOT  SPECIFIED  Final   Special Requests   Final    BOTTLES DRAWN AEROBIC AND ANAEROBIC Blood Culture adequate volume   Culture   Final    NO GROWTH 4 DAYS Performed at [facility], [address], [place], [place] [address]    Report Status PENDING  Incomplete  Resp Panel by RT-PCR (Flu A&B, Covid) Nasopharyngeal Swab     Status: None   Collection Time: 05/18/20  8:06 PM    Specimen: Nasopharyngeal Swab; Nasopharyngeal(NP) swabs in vial transport medium  Result Value Ref Range Status   SARS Coronavirus 2 by RT PCR NEGATIVE NEGATIVE Final    Comment: (NOTE) SARS-CoV-2 target nucleic acids are NOT DETECTED.  The SARS-CoV-2 RNA is generally detectable in upper respiratory specimens during the acute phase of infection. The lowest concentration of SARS-CoV-2 viral copies this assay can detect is 138 copies/mL. A negative result does not preclude SARS-Cov-2 infection and should not be used as the sole basis for treatment or other patient management decisions. A negative result may occur with  improper specimen collection/handling, submission of specimen other than nasopharyngeal swab, presence of viral mutation(s) within the areas targeted by this assay, and inadequate number of viral copies(<138 copies/mL). A negative result must be combined with clinical observations, patient history, and epidemiological information. The expected result is Negative.  Fact Sheet for Patients:  EntrepreneurPulse.com.au  Fact Sheet for Healthcare Providers:  IncredibleEmployment.be  This test is no t yet approved or cleared by the [place] FDA and  has been authorized for detection and/or diagnosis of SARS-CoV-2 by FDA under an Emergency Use Authorization (EUA). This EUA will remain  in effect (meaning this test can be used) for the duration of the COVID-19 declaration under Section 564(b)(1) of the Act, 21 U.S.C.section 360bbb-3(b)(1), unless the authorization is terminated  or revoked sooner.       Influenza A by PCR NEGATIVE NEGATIVE Final   Influenza B by PCR NEGATIVE NEGATIVE Final    Comment: (NOTE) The Xpert Xpress SARS-CoV-2/FLU/RSV plus assay is intended as an aid in the diagnosis of influenza from Nasopharyngeal swab specimens and should not be used as a sole basis for treatment. Nasal washings and aspirates are  unacceptable for Xpert Xpress SARS-CoV-2/FLU/RSV testing.  Fact Sheet for Patients: EntrepreneurPulse.com.au  Fact Sheet for Healthcare Providers: IncredibleEmployment.be  This test is not yet approved or cleared by the [place] FDA and has been authorized for detection and/or diagnosis of SARS-CoV-2 by FDA under an Emergency Use Authorization (EUA). This EUA will remain in effect (meaning this test can be used) for the duration of the COVID-19 declaration under Section 564(b)(1) of the Act, 21 U.S.C. section 360bbb-3(b)(1), unless the authorization is terminated or revoked.  Performed at [facility], [place] [address] [place], [place] [address]   C Difficile Quick Screen w PCR reflex     Status: None   Collection Time: 05/18/20 10:28 PM   Specimen: STOOL  Result Value Ref Range Status   C Diff antigen NEGATIVE NEGATIVE Final   C Diff toxin NEGATIVE NEGATIVE Final   C Diff interpretation No C. difficile detected.  Final    Comment: Performed at [facility], [place] [address] [place], [place] [address]  Gastrointestinal Panel by PCR , Stool     Status: None   Collection Time: 05/18/20 10:28 PM   Specimen: STOOL  Result Value Ref Range Status   Campylobacter species NOT DETECTED NOT DETECTED Final   Plesimonas  shigelloides NOT DETECTED NOT DETECTED Final   Salmonella species NOT DETECTED NOT DETECTED Final   Yersinia enterocolitica NOT DETECTED NOT DETECTED Final   Vibrio species NOT DETECTED NOT DETECTED Final   Vibrio cholerae NOT DETECTED NOT DETECTED Final   Enteroaggregative E coli (EAEC) NOT DETECTED NOT DETECTED Final   Enteropathogenic E coli (EPEC) NOT DETECTED NOT DETECTED Final   Enterotoxigenic E coli (ETEC) NOT DETECTED NOT DETECTED Final   Shiga like toxin producing E coli (STEC) NOT DETECTED NOT DETECTED Final   Shigella/Enteroinvasive E coli (EIEC) NOT DETECTED NOT DETECTED Final   Cryptosporidium NOT DETECTED  NOT DETECTED Final   Cyclospora cayetanensis NOT DETECTED NOT DETECTED Final   Entamoeba histolytica NOT DETECTED NOT DETECTED Final   Giardia lamblia NOT DETECTED NOT DETECTED Final   Adenovirus F40/41 NOT DETECTED NOT DETECTED Final   Astrovirus NOT DETECTED NOT DETECTED Final   Norovirus GI/GII NOT DETECTED NOT DETECTED Final   Rotavirus A NOT DETECTED NOT DETECTED Final   Sapovirus (I, II, IV, and V) NOT DETECTED NOT DETECTED Final    Comment: Performed at [facility], [address] [place], [place] [address]      Radiology Studies: DG CHEST PORT 1 VIEW  Result Date: 05/22/2020 CLINICAL DATA:  Hypoxia. EXAM: PORTABLE CHEST 1 VIEW COMPARISON:  05/18/2020 FINDINGS: Lung volumes are low. Heart size and mediastinal contours are unremarkable. Atelectasis is identified within the left base. No pleural effusion or interstitial edema identified. No airspace consolidation. IMPRESSION: Low lung volumes and left base atelectasis. Electronically Signed   By: Kerby Moors M.D.   On: 05/22/2020 05:22   ECHOCARDIOGRAM COMPLETE  Result Date: 05/20/2020    ECHOCARDIOGRAM REPORT   Patient Name:   Terry Palmer Date of Exam: 05/20/2020 Medical Rec #:  [id number]        Height:       76.0 in Accession #:    [id number]       Weight:       185.0 lb Date of Birth:  [birth year]       BSA:          2.143 m Patient Age:    30 years         BP:           142/91 mmHg Patient Gender: M                HR:           119 bpm. Exam Location:  Inpatient Procedure: 2D Echo Indications:    Ventricular Tachycardia  History:        Patient has prior history of Echocardiogram examinations, most                 recent 08/09/2005. Multiple sclerosis. Sepsis.  Sonographer:    Johny Chess Referring Phys: [id number] Anistyn Graddy Sharpes  1. Left ventricular ejection fraction, by estimation, is 55%. The left ventricle has normal function. The left ventricle has no regional wall motion abnormalities. Left ventricular  diastolic parameters are consistent with Grade I diastolic dysfunction (impaired relaxation).  2. Right ventricular systolic function is normal. The right ventricular size is normal. Tricuspid regurgitation signal is inadequate for assessing PA pressure.  3. The mitral valve is normal in structure. No evidence of mitral valve regurgitation. No evidence of mitral stenosis.  4. The aortic valve is tricuspid. Aortic valve regurgitation is not visualized. No aortic stenosis is present.  5. The IVC was not visualized.  FINDINGS  Left Ventricle: Left ventricular ejection fraction, by estimation, is 55%. The left ventricle has normal function. The left ventricle has no regional wall motion abnormalities. The left ventricular internal cavity size was normal in size. There is no left ventricular hypertrophy. Left ventricular diastolic parameters are consistent with Grade I diastolic dysfunction (impaired relaxation). Right Ventricle: The right ventricular size is normal. No increase in right ventricular wall thickness. Right ventricular systolic function is normal. Tricuspid regurgitation signal is inadequate for assessing PA pressure. Left Atrium: Left atrial size was normal in size. Right Atrium: Right atrial size was normal in size. Pericardium: There is no evidence of pericardial effusion. Mitral Valve: The mitral valve is normal in structure. No evidence of mitral valve regurgitation. No evidence of mitral valve stenosis. Tricuspid Valve: The tricuspid valve is normal in structure. Tricuspid valve regurgitation is not demonstrated. Aortic Valve: The aortic valve is tricuspid. Aortic valve regurgitation is not visualized. No aortic stenosis is present. Pulmonic Valve: The pulmonic valve was normal in structure. Pulmonic valve regurgitation is not visualized. Aorta: The aortic root is normal in size and structure. Venous: The inferior vena cava was not well visualized. IAS/Shunts: No atrial level shunt detected by color  flow Doppler.  LEFT VENTRICLE PLAX 2D LVIDd:         4.80 cm     Diastology LVIDs:         3.30 cm     LV e' medial: 7.62 cm/s LV PW:         0.90 cm LV IVS:        0.80 cm LVOT diam:     2.50 cm LV SV:         67 LV SV Index:   31 LVOT Area:     4.91 cm  LV Volumes (MOD) LV vol d, MOD A2C: 85.2 ml LV vol d, MOD A4C: 80.3 ml LV vol s, MOD A2C: 48.6 ml LV vol s, MOD A4C: 45.8 ml LV SV MOD A2C:     36.6 ml LV SV MOD A4C:     80.3 ml LV SV MOD BP:      36.9 ml RIGHT VENTRICLE RV S prime:     18.20 cm/s TAPSE (M-mode): 1.7 cm LEFT ATRIUM           Index       RIGHT ATRIUM          Index LA diam:      2.30 cm 1.07 cm/m  RA Area:     9.01 cm LA Vol (A2C): 33.5 ml 15.63 ml/m RA Volume:   14.50 ml 6.77 ml/m LA Vol (A4C): 31.2 ml 14.56 ml/m  AORTIC VALVE LVOT Vmax:   91.40 cm/s LVOT Vmean:  58.900 cm/s LVOT VTI:    0.137 m  AORTA Ao Root diam: 3.10 cm  SHUNTS Systemic VTI:  0.14 m Systemic Diam: 2.50 cm Loralie Champagne MD Electronically signed by Loralie Champagne MD Signature Date/Time: 05/20/2020/4:18:51 PM    Final     Scheduled Meds: . vitamin C  500 mg Per Tube Daily  . baclofen  20 mg Per Tube QID  . buPROPion  75 mg Per Tube TID  . Chlorhexidine Gluconate Cloth  6 each Topical Daily  . dantrolene  50 mg Per Tube 4 times per day  . feeding supplement (PROSource TF)  90 mL Per Tube BID  . folic acid  1 mg Per Tube Daily  . free water  240 mL Per  Tube Q4H  . insulin aspart  0-15 Units Subcutaneous Q4H  . insulin detemir  20 Units Subcutaneous Daily  . levETIRAcetam  1,000 mg Per Tube TID  . multivitamin with minerals  1 tablet Per Tube Daily  . nutrition supplement (JUVEN)  1 packet Per Tube BID BM  . pantoprazole sodium  40 mg Per Tube Daily  . polyvinyl alcohol  1 drop Both Eyes BID  . vitamin B-12  500 mcg Per Tube Daily   Continuous Infusions: . sodium chloride 100 mL/hr at 05/22/20 0600  . ampicillin-sulbactam (UNASYN) IV 3 g (05/22/20 0631)  . DAPTOmycin (CUBICIN)  IV Stopped (05/21/20 2047)   . feeding supplement (JEVITY 1.5 CAL/FIBER) 1,000 mL (05/22/20 0930)  . potassium chloride 10 mEq (05/22/20 1042)     LOS: 4 days   Time spent: 35 minutes   Darliss Cheney, MD [facility]  05/22/2020, 1:18 PM   How to contact the [facility] Attending or Consulting provider [place] or covering provider during after hours [place], for this patient?  1. Check the care team in [facility] and look for a) attending/consulting [facility] provider listed and b) the [facility] team listed. Page or secure chat [phone]. 2. Log into [email or web address] and use [place]'s universal password to access. If you do not have the password, please contact the hospital operator. 3. Locate the [facility] provider you are looking for under [facility] and page to a number that you can be directly reached. 4. If you still have difficulty reaching the provider, please page the [facility] (Director on Call) for the Hospitalists listed on amion for assistance.

## 2020-05-22 NOTE — Procedures (Signed)
Patient Name: NAZAIAH NAVARRETE  MRN: [id number]  Epilepsy Attending: Lora Havens  Referring Physician/Provider: Dr Irene Pap Date: 05/22/2020  Duration: 23.45 mins  Patient history: 45yo M with h/o seizures, worsening mentation. EEG to evaluate for seizure  Level of alertness:  lethargic   AEDs during EEG study: LEV  Technical aspects: This EEG study was done with scalp electrodes positioned according to the 10-20 International system of electrode placement. Electrical activity was acquired at a sampling rate of 500Hz  and reviewed with a high frequency filter of 70Hz  and a low frequency filter of 1Hz . EEG data were recorded continuously and digitally stored.   Description: No posterior dominant rhythm was seen. EEG showed continuous generalized 3 to 6 Hz theta-delta slowing. Hyperventilation and photic stimulation were not performed.     ABNORMALITY - Continuous slow, generalized  IMPRESSION: This study is suggestive of moderate diffuse encephalopathy, nonspecific etiology. No seizures or epileptiform discharges were seen throughout the recording.   Taveon Enyeart [name]

## 2020-05-22 NOTE — Progress Notes (Signed)
eLink Physician-Brief Progress Note Patient Name: Terry Palmer DOB: 04/12/1975 MRN: [id number]   Date of Service  05/22/2020  HPI/Events of Note  37M with advanced MS, quadriplegia c/b contractures, neurogenic bladder with chronic suprapubic catheter, PEG tube, and history of seizures who was transferred to ICU this evening for RN report of change in mental status.   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 He has been treated with unasyn for pneumonia and vancomycin for osteo (per ID recommendations).  This evening, he was not rousable by his RN. Ground team evaluated patient and noted snoring and hypoxia and absent gag reflex initially, but this resolved spontaneously shortly thereafter. He then started having "wandering eye movements". ABG obtained which was normal. Due to concern for seizure activity with subsequent post-ictal state the patient was transferred to the ICU for monitoring and possible EEG.  On exam, the patient is spontaneously opening his eyes. He is non-verbal (his baseline). Intermittently responsive to painful stimuli. Vitals: Sinus tachycardia to 115 bpm. BP 157/99. SpO2 99% on room air.   eICU Interventions  # Neuro: - EEG and ICU monitoring for now - Continue home Keppra  # ID: - Osteomyelitis: Cont vanc per ID recs - Pneumonia: Cont Unasyn per ID recs  DVT PPX: SCDs GI PPX: Protonix  Remainder of plan as per Dr. Arvil Persons note.     Intervention Category Evaluation Type: New Patient Evaluation  Charlott Rakes 05/22/2020, 6:08 AM

## 2020-05-23 DIAGNOSIS — A419 Sepsis, unspecified organism: Secondary | ICD-10-CM | POA: Diagnosis not present

## 2020-05-23 LAB — BASIC METABOLIC PANEL
Anion gap: 6 (ref 5–15)
BUN: 10 mg/dL (ref 6–20)
CO2: 25 mmol/L (ref 22–32)
Calcium: 8.3 mg/dL — ABNORMAL LOW (ref 8.9–10.3)
Chloride: 106 mmol/L (ref 98–111)
Creatinine, Ser: <0.3 mg/dL — ABNORMAL LOW (ref 0.61–1.24)
Glucose, Bld: 137 mg/dL — ABNORMAL HIGH (ref 70–99)
Potassium: 3.5 mmol/L (ref 3.5–5.1)
Sodium: 137 mmol/L (ref 135–145)

## 2020-05-23 LAB — CBC WITH DIFFERENTIAL/PLATELET
Abs Immature Granulocytes: 0.16 10*3/uL — ABNORMAL HIGH (ref 0.00–0.07)
Basophils Absolute: 0.1 10*3/uL (ref 0.0–0.1)
Basophils Relative: 0 %
Eosinophils Absolute: 0.4 10*3/uL (ref 0.0–0.5)
Eosinophils Relative: 3 %
HCT: 33 % — ABNORMAL LOW (ref 39.0–52.0)
Hemoglobin: 10.5 g/dL — ABNORMAL LOW (ref 13.0–17.0)
Immature Granulocytes: 1 %
Lymphocytes Relative: 20 %
Lymphs Abs: 3.1 10*3/uL (ref 0.7–4.0)
MCH: 29 pg (ref 26.0–34.0)
MCHC: 31.8 g/dL (ref 30.0–36.0)
MCV: 91.2 fL (ref 80.0–100.0)
Monocytes Absolute: 1.1 10*3/uL — ABNORMAL HIGH (ref 0.1–1.0)
Monocytes Relative: 7 %
Neutro Abs: 11 10*3/uL — ABNORMAL HIGH (ref 1.7–7.7)
Neutrophils Relative %: 69 %
Platelets: 494 10*3/uL — ABNORMAL HIGH (ref 150–400)
RBC: 3.62 MIL/uL — ABNORMAL LOW (ref 4.22–5.81)
RDW: 13 % (ref 11.5–15.5)
WBC: 15.8 10*3/uL — ABNORMAL HIGH (ref 4.0–10.5)
nRBC: 0 % (ref 0.0–0.2)

## 2020-05-23 LAB — GLUCOSE, CAPILLARY
Glucose-Capillary: 159 mg/dL — ABNORMAL HIGH (ref 70–99)
Glucose-Capillary: 130 mg/dL — ABNORMAL HIGH (ref 70–99)
Glucose-Capillary: 172 mg/dL — ABNORMAL HIGH (ref 70–99)
Glucose-Capillary: 182 mg/dL — ABNORMAL HIGH (ref 70–99)
Glucose-Capillary: 203 mg/dL — ABNORMAL HIGH (ref 70–99)
Glucose-Capillary: 183 mg/dL — ABNORMAL HIGH (ref 70–99)

## 2020-05-23 LAB — CULTURE, BLOOD (ROUTINE X 2)
Culture: NO GROWTH
Special Requests: ADEQUATE

## 2020-05-23 MED ORDER — COLLAGENASE 250 UNIT/GM EX OINT
TOPICAL_OINTMENT | Freq: Every day | CUTANEOUS | Status: DC
  Filled 2020-05-23 (×2): qty 30

## 2020-05-23 NOTE — Progress Notes (Signed)
Physical Therapy Wound Treatment Patient Details  Name: Terry Palmer MRN: [id number] Date of Birth: December 22, 1975  Today's Date: 05/23/2020 Time: 4166-0630 Time Calculation (min): 38 min  Subjective  Subjective Assessment Subjective: None - pt asleep throughout the entirety of session and nonverbal at baseline Patient and Family Stated Goals: None Date of Onset:  (Unknown) Prior Treatments:  (Dressing changes)  Pain Score:  Occasionally jerks during debridement but overall does not appear to be uncomfortable or in pain.   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 (Active)  Dressing Type ABD;Barrier Film (skin prep);Gauze (Comment);Moist to moist 05/23/20 1000  Dressing Changed;Clean;Dry;Intact 05/23/20 1000  Dressing Change Frequency Daily 05/23/20 1000  State of Healing Early/partial granulation 05/23/20 1000  Site / Wound Assessment Pink;Yellow;Brown;Black 05/23/20 1000  % Wound base Red or Granulating 40% 05/23/20 1000  % Wound base Yellow/Fibrinous Exudate 50% 05/23/20 1000  % Wound base Black/Eschar 10% 05/23/20 1000  % Wound base Other/Granulation Tissue (Comment) 0% 05/23/20 1000  Peri-wound Assessment Intact;Bleeding 05/23/20 1000  Wound Length (cm) 9 cm 05/20/20 1325  Wound Width (cm) 8.5 cm 05/20/20 1325  Wound Depth (cm) 4.5 cm 05/20/20 1325  Wound Surface Area (cm^2) 76.5 cm^2 05/20/20 1325  Wound Volume (cm^3) 344.25 cm^3 05/20/20 1325  Tunneling (cm) 5 05/20/20 1325  Undermining (cm) (varying up to 4.5 cm 05/20/20 1325  Margins Unattached edges (unapproximated) 05/23/20 1000  Drainage Amount Minimal 05/23/20 1000  Drainage Description Serosanguineous 05/23/20 1000  Treatment Debridement (Selective);Hydrotherapy (Pulse lavage);Packing (Saline gauze) 05/23/20 1000   Hydrotherapy Pulsed lavage therapy - wound location: L ischial tuberosity Pulsed Lavage with Suction  (psi): 8 psi (to 12) Pulsed Lavage with Suction - Normal Saline Used: 1000 mL Pulsed Lavage Tip: Tip with splash shield Selective Debridement Selective Debridement - Location: L ischial tuberosity Selective Debridement - Tools Used: Forceps,Scalpel Selective Debridement - Tissue Removed: yellow unviable tissue    Wound Assessment and Plan  Wound Therapy - Assess/Plan/Recommendations Wound Therapy - Clinical Statement: Progressing with debridement this session. Occasionally jerks during debridement however overall does not appear to be in pain as he is asleep throughout session. This patient will benefit from continued hydrotherapy for selective removal of unviable tissue, to decrease bioburden, and promote wound bed healing. Wound Therapy - Functional Problem List: Decreased strength, AROM, contractures, immobility due to MS Factors Delaying/Impairing Wound Healing: Immobility,Multiple medical problems,Incontinence Hydrotherapy Plan: Debridement,Dressing change,Pulsatile lavage with suction,Patient/family education Wound Therapy - Frequency: 6X / week Wound Therapy - Follow Up Recommendations: dressing changes by RN  Wound Therapy Goals- Improve the function of patient's integumentary system by progressing the wound(s) through the phases of wound healing (inflammation - proliferation - remodeling) by: Wound Therapy Goals - Improve the function of patient's integumentary system by progressing the wound(s) through the phases of wound healing by: Decrease Necrotic Tissue to: 20% Decrease Necrotic Tissue - Progress: Progressing toward goal Increase Granulation Tissue to: 80% Increase Granulation Tissue - Progress: Progressing toward goal Goals/treatment plan/discharge plan were made with and agreed upon by patient/family: Yes Time For Goal Achievement: 7 days Wound Therapy - Potential for Goals: Fair  Goals will be updated until maximal potential achieved or discharge criteria met.   Discharge criteria: when goals achieved, discharge from hospital, MD decision/surgical intervention, no progress towards goals, refusal/missing three consecutive treatments without notification or medical reason.  GP     Charges PT Wound Care Charges $Wound Debridement up to  20 cm: < or equal to 20 cm $ Wound Debridement each add'l 20 sqcm: 2 $PT PLS Gun and Tip: 1 Supply $PT Hydrotherapy Visit: 1 Visit       Thelma Comp 05/23/2020, 10:29 AM   Rolinda Roan, PT, DPT Acute Rehabilitation Services Pager: [phone] Office: [phone]

## 2020-05-23 NOTE — Progress Notes (Signed)
[facility] Progress Note     Subjective: CC-  Seen with PT during hydrotherapy  Objective: Vital signs in last 24 hours: Temp:  [98.5 F (36.9 C)-100.1 F (37.8 C)] 99.3 F (37.4 C) (05/16 0721) Pulse Rate:  [105-227] 111 (05/16 0800) Resp:  [14-29] 15 (05/16 0800) BP: (118-175)/(76-110) 124/77 (05/16 0800) SpO2:  [91 %-100 %] 100 % (05/16 0800) Last BM Date: 05/21/20  Intake/Output from previous day: 05/15 0701 - 05/16 0700 In: 5349.6 [I.V.:2294; NG/GT:1820; IV Piggyback:1235.5] Out: 4475 [Urine:4475] Intake/Output this shift: No intake/output data recorded.  PE: Gen:  Snoring, NAD GU: large open wound over left ischial tuberosity with palpable bone, some fibrinous exudate and necrotic tissue (significantly improved), no purulent drainage or cellulitis noted     Lab Results:  Recent Labs    05/22/20 0219 05/23/20 0534  WBC 15.1* 15.8*  HGB 10.5* 10.5*  HCT 32.7* 33.0*  PLT 472* 494*   BMET Recent Labs    05/22/20 0219 05/23/20 0534  NA 137 137  K 3.2* 3.5  CL 106 106  CO2 22 25  GLUCOSE 216* 137*  BUN 10 10  CREATININE <0.30* <0.30*  CALCIUM 8.3* 8.3*   PT/INR No results for input(s): LABPROT, INR in the last 72 hours. CMP     Component Value Date/Time   NA 137 05/23/2020 0534   K 3.5 05/23/2020 0534   CL 106 05/23/2020 0534   CO2 25 05/23/2020 0534   GLUCOSE 137 (H) 05/23/2020 0534   BUN 10 05/23/2020 0534   CREATININE <0.30 (L) 05/23/2020 0534   CALCIUM 8.3 (L) 05/23/2020 0534   PROT 7.6 05/18/2020 1804   ALBUMIN 2.7 (L) 05/18/2020 1804   AST 26 05/18/2020 1804   ALT 25 05/18/2020 1804   ALKPHOS 107 05/18/2020 1804   BILITOT 0.2 (L) 05/18/2020 1804   GFRNONAA NOT CALCULATED 05/23/2020 0534   GFRAA >60 12/10/2017 0442   Lipase     Component Value Date/Time   LIPASE 28 05/18/2020 1804       Studies/Results: DG CHEST PORT 1 VIEW  Result Date: 05/22/2020 CLINICAL DATA:  Hypoxia. EXAM: PORTABLE CHEST 1 VIEW  COMPARISON:  05/18/2020 FINDINGS: Lung volumes are low. Heart size and mediastinal contours are unremarkable. Atelectasis is identified within the left base. No pleural effusion or interstitial edema identified. No airspace consolidation. IMPRESSION: Low lung volumes and left base atelectasis. Electronically Signed   By: Kerby Moors M.D.   On: 05/22/2020 05:22   EEG adult  Result Date: 05/22/2020 Lora Havens, MD     05/22/2020  2:08 PM Patient Name: Terry Palmer MRN: [id number] Epilepsy Attending: Lora Havens Referring Physician/Provider: Dr Irene Pap Date: 05/22/2020 Duration: 23.45 mins Patient history: 45yo M with h/o seizures, worsening mentation. EEG to evaluate for seizure Level of alertness:  lethargic AEDs during EEG study: LEV Technical aspects: This EEG study was done with scalp electrodes positioned according to the 10-20 International system of electrode placement. Electrical activity was acquired at a sampling rate of 500Hz  and reviewed with a high frequency filter of 70Hz  and a low frequency filter of 1Hz . EEG data were recorded continuously and digitally stored. Description: No posterior dominant rhythm was seen. EEG showed continuous generalized 3 to 6 Hz theta-delta slowing. Hyperventilation and photic stimulation were not performed.   ABNORMALITY - Continuous slow, generalized IMPRESSION: This study is suggestive of moderate diffuse encephalopathy, nonspecific etiology. No seizures or epileptiform discharges were seen throughout the recording. Priyanka [name]  Anti-infectives: Anti-infectives (From admission, onward)   Start     Dose/Rate Route Frequency Ordered Stop   05/20/20 2000  DAPTOmycin (CUBICIN) 650 mg in sodium chloride 0.9 % IVPB        8 mg/kg  83.9 kg 126 mL/hr over 30 Minutes Intravenous Daily 05/20/20 1544     05/19/20 2000  azithromycin (ZITHROMAX) 500 mg in sodium chloride 0.9 % 250 mL IVPB  Status:  Discontinued        500 mg 250 mL/hr over 60  Minutes Intravenous Every 24 hours 05/18/20 2114 05/20/20 0901   05/19/20 1800  Ampicillin-Sulbactam (UNASYN) 3 g in sodium chloride 0.9 % 100 mL IVPB        3 g 200 mL/hr over 30 Minutes Intravenous Every 6 hours 05/19/20 1712     05/19/20 0800  vancomycin (VANCOREADY) IVPB 750 mg/150 mL  Status:  Discontinued        750 mg 150 mL/hr over 60 Minutes Intravenous Every 12 hours 05/18/20 2056 05/20/20 1544   05/19/20 0400  ceFEPIme (MAXIPIME) 2 g in sodium chloride 0.9 % 100 mL IVPB  Status:  Discontinued        2 g 200 mL/hr over 30 Minutes Intravenous Every 8 hours 05/18/20 2056 05/19/20 1711   05/18/20 1845  vancomycin (VANCOREADY) IVPB 1500 mg/300 mL        1,500 mg 150 mL/hr over 120 Minutes Intravenous  Once 05/18/20 1844 05/18/20 2219   05/18/20 1845  ceFEPIme (MAXIPIME) 2 g in sodium chloride 0.9 % 100 mL IVPB        2 g 200 mL/hr over 30 Minutes Intravenous  Once 05/18/20 1844 05/18/20 2002   05/18/20 1845  azithromycin (ZITHROMAX) 500 mg in sodium chloride 0.9 % 250 mL IVPB        500 mg 250 mL/hr over 60 Minutes Intravenous  Once 05/18/20 1844 05/18/20 2142       Assessment/Plan Advanced underlying multiple sclerosis Functional quadriplegia, BUE/BLE contractures Dysphagia with chronic indwelling PEG Neurogenic bladder with chronic suprapubic catheter Seizure disorder Sepsis, CAP, possible catheter associated UTI, bacteremia vs contamination  Left ischial wound with osteomyelitis - Wound is cleaning up well. No purulent drainage or cellulitis on exam. Would not recommend surgical debridement. Continue hydrotherapy and BID dressing changes. Antibiotics for osteomyelitis per ID. Agree with continued goals of care discussions. We will sign off, call with questions or concerns.     LOS: 5 days    Wellington Hampshire, [facility] 05/23/2020, 10:21 AM Please see Amion for pager number during day hours 7:00am-4:30pm

## 2020-05-23 NOTE — Progress Notes (Signed)
PROGRESS NOTE    Terry Palmer  [id number] DOB: 20-Nov-1975 DOA: 05/18/2020 PCP: [name], MD   Brief Narrative:  Terry Palmer is a 45 y.o. male with medical history significant of advanced multiple sclerosis, nonverbal at baseline, quadriplegic with contractures, neurogenic bladder with chronic suprapubic catheter, dysphagia with chronic indwelling PEG tube, dementia, history of seizures presented to the ED via EMS for evaluation of fevers and decubitus ulcer getting worse.  Wife states he has a chronic sacral wound which has been worsening for since March with more drainage.   In the ED, afebrile. Tachycardic with heart rate up to 120s and slightly tachypneic.  Not hypotensive.  Not hypoxic.  Labs showing WBC 19.3, hemoglobin 12.1 (stable compared to prior labs), platelet count 549K.  UA with negative nitrite, large amount of leukocytes, greater than 50 WBCs, and few bacteria.  Urine culture pending.  COVID and influenza panel negative.  Chest x-ray showing left lung base opacity suspicious for pneumonia.  Patient noted to have a large sacral wound.  CT showing significant progression and soft tissue wound overlying the left ischial tuberosity with underlying bony erosive changes consistent with osteomyelitis. Patient was given vancomycin, cefepime, and azithromycin and admitted under hospitalist service.  General surgery consulted who recommended hydrotherapy and then ID was consulted.  Early morning of 05/22/2020, he had acute change of mental status and he was not arousable.  He was transferred to ICU.  Did not require intubation or anything else.  Tube feedings were held.  He recovered soon.  Assessment & Plan:   Principal Problem:   Sepsis ([place]) Active Problems:   Multiple sclerosis ([place])   UTI (urinary tract infection)   CAP (community acquired pneumonia)   Osteomyelitis ([place])   Pressure injury of skin  Sepsis Secondary to large sacral wound with signs of  osteomyelitis/aspiration pneumonia: CT showing significant progression and soft tissue wound overlying the left ischial tuberosity with underlying bony erosive changes consistent with osteomyelitis.General surgery consulted.  They did not recommend any intervention.  They have ordered hydrotherapy through PT. Last time he was febrile was around 3 PM yesterday with a temperature of 101.3.  ID on board they recommend 6 weeks of IV antibiotics combined Zosyn and daptomycin.  Patient remains tachycardiac but it is improving.  Acute change in mental status: Patient back to baseline, awake and arousable but nonverbal at baseline.  EEG negative for any seizure activity.  Type 2 diabetes mellitus: Due to hyperglycemia on the BMP, hemoglobin A1c was checked and it is 8.6.  Patient is now diagnosed with type 2 diabetes mellitus.  No prior history of diabetes.  Blood sugar controlled.  Continue Lantus 20 units and SSI.  Bacteremia vs contamination: Only 1 of 4 bottles is growing Staphylococcus.  Likely contamination.  Final identification pending.  Continue above antibiotics.  Hypokalemia: Resolved  Diarrhea: Likely related to recent antibiotic use.  He was also receiving Dulcolax and MiraLAX at home.  Abdominal exam benign.  C. difficile negative.  GI pathogen panel negative  Advanced underlying MS causing functional quadriplegia, dysphagia with chronic indwelling PEG, neurogenic bladder with chronic suprapubic catheter -Continue supportive care  Seizure disorder -Continue Keppra  Goal of care: Discussed with wife yesterday about thinking about CODE STATUS.  She wanted to continue his full code.  I have consulted palliative care yesterday to have goals of care conversation with wife as patient has poor prognosis and long-term and perhaps in short-term as well.  DVT prophylaxis: SCDs  Start: 05/18/20 2108   Code Status: Full Code  Family Communication: None present at bedside.  Discussed with his wife  over the phone.  Status is: Inpatient  Remains inpatient appropriate because:Inpatient level of care appropriate due to severity of illness   Dispo: The patient is from: Home              Anticipated d/c is to: Home              Patient currently is not medically stable to d/c.   Difficult to place patient No        Estimated body mass index is 22.52 kg/m as calculated from the following:   Height as of this encounter: 6\' 4"  (1.93 m).   Weight as of this encounter: 83.9 kg.  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 (Active)  05/20/20 1346  Location: Ischial tuberosity  Location Orientation: Left  Staging: Stage 4 - Full thickness tissue loss with exposed bone, tendon or muscle.  Wound Description (Comments):   Present on Admission: Yes     Nutritional status:  Nutrition Problem: Increased nutrient needs Etiology: wound healing   Signs/Symptoms: estimated needs   Interventions: Refer to RD note for recommendations,Tube feeding,MVI    Consultants:   ID  General surgery  PCCM  Procedures:   None  Antimicrobials:  Anti-infectives (From admission, onward)   Start     Dose/Rate Route Frequency Ordered Stop   05/20/20 2000  DAPTOmycin (CUBICIN) 650 mg in sodium chloride 0.9 % IVPB        8 mg/kg  83.9 kg 126 mL/hr over 30 Minutes Intravenous Daily 05/20/20 1544     05/19/20 2000  azithromycin (ZITHROMAX) 500 mg in sodium chloride 0.9 % 250 mL IVPB  Status:  Discontinued        500 mg 250 mL/hr over 60 Minutes Intravenous Every 24 hours 05/18/20 2114 05/20/20 0901   05/19/20 1800  Ampicillin-Sulbactam (UNASYN) 3 g in sodium chloride 0.9 % 100 mL IVPB        3 g 200 mL/hr over 30 Minutes Intravenous Every 6 hours 05/19/20 1712     05/19/20 0800  vancomycin (VANCOREADY) IVPB 750 mg/150 mL  Status:  Discontinued        750 mg 150 mL/hr over 60 Minutes Intravenous Every 12  hours 05/18/20 2056 05/20/20 1544   05/19/20 0400  ceFEPIme (MAXIPIME) 2 g in sodium chloride 0.9 % 100 mL IVPB  Status:  Discontinued        2 g 200 mL/hr over 30 Minutes Intravenous Every 8 hours 05/18/20 2056 05/19/20 1711   05/18/20 1845  vancomycin (VANCOREADY) IVPB 1500 mg/300 mL        1,500 mg 150 mL/hr over 120 Minutes Intravenous  Once 05/18/20 1844 05/18/20 2219   05/18/20 1845  ceFEPIme (MAXIPIME) 2 g in sodium chloride 0.9 % 100 mL IVPB        2 g 200 mL/hr over 30 Minutes Intravenous  Once 05/18/20 1844 05/18/20 2002   05/18/20 1845  azithromycin (ZITHROMAX) 500 mg in sodium chloride 0.9 % 250 mL IVPB        500 mg 250 mL/hr over 60 Minutes Intravenous  Once 05/18/20 1844 05/18/20 2142         Subjective: Seen and examined.  Patient awake and arousable.  Nonverbal.  At his baseline.  Objective: Vitals:   05/23/20 0600 05/23/20 0700  05/23/20 0721 05/23/20 0800  BP: 118/83 119/76  124/77  Pulse: (!) 109 (!) 112  (!) 111  Resp: 15 17  15   Temp:   99.3 F (37.4 C)   TempSrc:   Oral   SpO2: 100% 100%  100%  Weight:      Height:        Intake/Output Summary (Last 24 hours) at 05/23/2020 0942 Last data filed at 05/23/2020 0600 Gross per 24 hour  Intake 5045.43 ml  Output 4475 ml  Net 570.43 ml   Filed Weights   05/18/20 2157 05/18/20 2246  Weight: 83.9 kg 83.9 kg    Examination: General exam: Appears calm and comfortable, nonverbal but arousable Respiratory system: Clear to auscultation. Respiratory effort normal. Cardiovascular system: S1 & S2 heard, sinus tachycardia. No JVD, murmurs, rubs, gallops or clicks. No pedal edema. Gastrointestinal system: Abdomen is slightly distended but easily reducible ventral hernia, soft and nontender. No organomegaly or masses felt. Normal bowel sounds heard. Central nervous system: Awake but not oriented.  Contracted extremities all 4 of them. Skin: Dressing at the sacral area  Data Reviewed: I have personally reviewed  following labs and imaging studies  CBC: Recent Labs  Lab 05/18/20 1804 05/18/20 1811 05/19/20 0201 05/20/20 0149 05/21/20 0125 05/22/20 0219 05/23/20 0534  WBC 19.3*  --  17.1* 16.8* 17.5* 15.1* 15.8*  NEUTROABS 15.0*  --   --  12.0* 11.9* 10.4* 11.0*  HGB 12.1*   < > 11.6* 11.1* 11.4* 10.5* 10.5*  HCT 37.9*   < > 36.2* 34.9* 36.3* 32.7* 33.0*  MCV 91.1  --  92.1 91.8 91.7 91.3 91.2  PLT 549*  --  499* 478* 437* 472* 494*   < > = values in this interval not displayed.   Basic Metabolic Panel: Recent Labs  Lab 05/19/20 0201 05/20/20 0149 05/21/20 0125 05/22/20 0219 05/23/20 0534  NA 135 134* 136 137 137  K 3.8 3.3* 3.8 3.2* 3.5  CL 101 104 106 106 106  CO2 23 21* 19* 22 25  GLUCOSE 140* 253* 197* 216* 137*  BUN 6 6 11 10 10   CREATININE <0.30* 0.40* 0.32* <0.30* <0.30*  CALCIUM 8.9 8.2* 8.4* 8.3* 8.3*  MG  --  2.1  --   --   --    GFR: CrCl cannot be calculated (This lab value cannot be used to calculate CrCl because it is not a number: <0.30). Liver Function Tests: Recent Labs  Lab 05/18/20 1804  AST 26  ALT 25  ALKPHOS 107  BILITOT 0.2*  PROT 7.6  ALBUMIN 2.7*   Recent Labs  Lab 05/18/20 1804  LIPASE 28   No results for input(s): AMMONIA in the last 168 hours. Coagulation Profile: Recent Labs  Lab 05/18/20 1804  INR 1.1   Cardiac Enzymes: Recent Labs  Lab 05/20/20 1529  CKTOTAL 66   BNP (last 3 results) No results for input(s): PROBNP in the last 8760 hours. HbA1C: No results for input(s): HGBA1C in the last 72 hours. CBG: Recent Labs  Lab 05/22/20 1602 05/22/20 1922 05/22/20 2315 05/23/20 0306 05/23/20 0720  GLUCAP 187* 213* 281* 159* 130*   Lipid Profile: No results for input(s): CHOL, HDL, LDLCALC, TRIG, CHOLHDL, LDLDIRECT in the last 72 hours. Thyroid Function Tests: No results for input(s): TSH, T4TOTAL, FREET4, T3FREE, THYROIDAB in the last 72 hours. Anemia Panel: No results for input(s): VITAMINB12, FOLATE, FERRITIN,  TIBC, IRON, RETICCTPCT in the last 72 hours. Sepsis Labs: Recent Labs  Lab 05/18/20  1804 05/18/20 1953  LATICACIDVEN 1.4 1.3    Recent Results (from the past 240 hour(s))  Blood Culture (routine x 2)     Status: Abnormal   Collection Time: 05/18/20  5:53 PM   Specimen: BLOOD  Result Value Ref Range Status   Specimen Description BLOOD SITE NOT SPECIFIED  Final   Special Requests   Final    BOTTLES DRAWN AEROBIC AND ANAEROBIC Blood Culture adequate volume   Culture  Setup Time   Final    GRAM POSITIVE COCCI IN CLUSTERS AEROBIC BOTTLE ONLY CRITICAL RESULT CALLED TO, READ BACK BY AND VERIFIED WITH: Salome Holmes PHARMD [id number] 05/19/20 A BROWNING    Culture (A)  Final    STAPHYLOCOCCUS HOMINIS THE SIGNIFICANCE OF ISOLATING THIS ORGANISM FROM A SINGLE SET OF BLOOD CULTURES WHEN MULTIPLE SETS ARE DRAWN IS UNCERTAIN. PLEASE NOTIFY THE MICROBIOLOGY DEPARTMENT WITHIN ONE WEEK IF SPECIATION AND SENSITIVITIES ARE REQUIRED. Performed at [facility], [place] [address] [place], [place] [address]    Report Status 05/20/2020 FINAL  Final  Blood Culture ID Panel (Reflexed)     Status: Abnormal   Collection Time: 05/18/20  5:53 PM  Result Value Ref Range Status   Enterococcus faecalis NOT DETECTED NOT DETECTED Final   Enterococcus Faecium NOT DETECTED NOT DETECTED Final   Listeria monocytogenes NOT DETECTED NOT DETECTED Final   Staphylococcus species DETECTED (A) NOT DETECTED Final    Comment: CRITICAL RESULT CALLED TO, READ BACK BY AND VERIFIED WITH: Salome Holmes PHARMD [id number] 05/19/20 A BROWNING    Staphylococcus aureus (BCID) NOT DETECTED NOT DETECTED Final   Staphylococcus epidermidis NOT DETECTED NOT DETECTED Final   Staphylococcus lugdunensis NOT DETECTED NOT DETECTED Final   Streptococcus species NOT DETECTED NOT DETECTED Final   Streptococcus agalactiae NOT DETECTED NOT DETECTED Final   Streptococcus pneumoniae NOT DETECTED NOT DETECTED Final   Streptococcus pyogenes NOT DETECTED NOT  DETECTED Final   A.calcoaceticus-baumannii NOT DETECTED NOT DETECTED Final   Bacteroides fragilis NOT DETECTED NOT DETECTED Final   Enterobacterales NOT DETECTED NOT DETECTED Final   Enterobacter cloacae complex NOT DETECTED NOT DETECTED Final   Escherichia coli NOT DETECTED NOT DETECTED Final   Klebsiella aerogenes NOT DETECTED NOT DETECTED Final   Klebsiella oxytoca NOT DETECTED NOT DETECTED Final   Klebsiella pneumoniae NOT DETECTED NOT DETECTED Final   Proteus species NOT DETECTED NOT DETECTED Final   Salmonella species NOT DETECTED NOT DETECTED Final   Serratia marcescens NOT DETECTED NOT DETECTED Final   Haemophilus influenzae NOT DETECTED NOT DETECTED Final   Neisseria meningitidis NOT DETECTED NOT DETECTED Final   Pseudomonas aeruginosa NOT DETECTED NOT DETECTED Final   Stenotrophomonas maltophilia NOT DETECTED NOT DETECTED Final   Candida albicans NOT DETECTED NOT DETECTED Final   Candida auris NOT DETECTED NOT DETECTED Final   Candida glabrata NOT DETECTED NOT DETECTED Final   Candida krusei NOT DETECTED NOT DETECTED Final   Candida parapsilosis NOT DETECTED NOT DETECTED Final   Candida tropicalis NOT DETECTED NOT DETECTED Final   Cryptococcus neoformans/gattii NOT DETECTED NOT DETECTED Final    Comment: Performed at [facility], [address], [place], [place] [address]  Blood Culture (routine x 2)     Status: None (Preliminary result)   Collection Time: 05/18/20  6:24 PM   Specimen: BLOOD  Result Value Ref Range Status   Specimen Description BLOOD SITE NOT SPECIFIED  Final   Special Requests   Final    BOTTLES DRAWN AEROBIC AND  ANAEROBIC Blood Culture adequate volume   Culture   Final    NO GROWTH 4 DAYS Performed at [place] [address] [place], [place] [address]    Report Status PENDING  Incomplete  Resp Panel by RT-PCR (Flu A&B, Covid) Nasopharyngeal Swab     Status: None   Collection Time: 05/18/20  8:06 PM   Specimen: Nasopharyngeal Swab;  Nasopharyngeal(NP) swabs in vial transport medium  Result Value Ref Range Status   SARS Coronavirus 2 by RT PCR NEGATIVE NEGATIVE Final    Comment: (NOTE) SARS-CoV-2 target nucleic acids are NOT DETECTED.  The SARS-CoV-2 RNA is generally detectable in upper respiratory specimens during the acute phase of infection. The lowest concentration of SARS-CoV-2 viral copies this assay can detect is 138 copies/mL. A negative result does not preclude SARS-Cov-2 infection and should not be used as the sole basis for treatment or other patient management decisions. A negative result may occur with  improper specimen collection/handling, submission of specimen other than nasopharyngeal swab, presence of viral mutation(s) within the areas targeted by this assay, and inadequate number of viral copies(<138 copies/mL). A negative result must be combined with clinical observations, patient history, and epidemiological information. The expected result is Negative.  Fact Sheet for Patients:  EntrepreneurPulse.com.au  Fact Sheet for Healthcare Providers:  IncredibleEmployment.be  This test is no t yet approved or cleared by the [place] FDA and  has been authorized for detection and/or diagnosis of SARS-CoV-2 by FDA under an Emergency Use Authorization (EUA). This EUA will remain  in effect (meaning this test can be used) for the duration of the COVID-19 declaration under Section 564(b)(1) of the Act, 21 U.S.C.section 360bbb-3(b)(1), unless the authorization is terminated  or revoked sooner.       Influenza A by PCR NEGATIVE NEGATIVE Final   Influenza B by PCR NEGATIVE NEGATIVE Final    Comment: (NOTE) The Xpert Xpress SARS-CoV-2/FLU/RSV plus assay is intended as an aid in the diagnosis of influenza from Nasopharyngeal swab specimens and should not be used as a sole basis for treatment. Nasal washings and aspirates are unacceptable for Xpert Xpress  SARS-CoV-2/FLU/RSV testing.  Fact Sheet for Patients: EntrepreneurPulse.com.au  Fact Sheet for Healthcare Providers: IncredibleEmployment.be  This test is not yet approved or cleared by the [place] FDA and has been authorized for detection and/or diagnosis of SARS-CoV-2 by FDA under an Emergency Use Authorization (EUA). This EUA will remain in effect (meaning this test can be used) for the duration of the COVID-19 declaration under Section 564(b)(1) of the Act, 21 U.S.C. section 360bbb-3(b)(1), unless the authorization is terminated or revoked.  Performed at [facility], [place] [address] [place], [place] [address]   C Difficile Quick Screen w PCR reflex     Status: None   Collection Time: 05/18/20 10:28 PM   Specimen: STOOL  Result Value Ref Range Status   C Diff antigen NEGATIVE NEGATIVE Final   C Diff toxin NEGATIVE NEGATIVE Final   C Diff interpretation No C. difficile detected.  Final    Comment: Performed at [facility], [place] [address], [place], [place] [address]  Gastrointestinal Panel by PCR , Stool     Status: None   Collection Time: 05/18/20 10:28 PM   Specimen: STOOL  Result Value Ref Range Status   Campylobacter species NOT DETECTED NOT DETECTED Final   Plesimonas shigelloides NOT DETECTED NOT DETECTED Final   Salmonella species NOT DETECTED NOT DETECTED Final  Yersinia enterocolitica NOT DETECTED NOT DETECTED Final   Vibrio species NOT DETECTED NOT DETECTED Final   Vibrio cholerae NOT DETECTED NOT DETECTED Final   Enteroaggregative E coli (EAEC) NOT DETECTED NOT DETECTED Final   Enteropathogenic E coli (EPEC) NOT DETECTED NOT DETECTED Final   Enterotoxigenic E coli (ETEC) NOT DETECTED NOT DETECTED Final   Shiga like toxin producing E coli (STEC) NOT DETECTED NOT DETECTED Final   Shigella/Enteroinvasive E coli (EIEC) NOT DETECTED NOT DETECTED Final   Cryptosporidium NOT DETECTED NOT DETECTED Final    Cyclospora cayetanensis NOT DETECTED NOT DETECTED Final   Entamoeba histolytica NOT DETECTED NOT DETECTED Final   Giardia lamblia NOT DETECTED NOT DETECTED Final   Adenovirus F40/41 NOT DETECTED NOT DETECTED Final   Astrovirus NOT DETECTED NOT DETECTED Final   Norovirus GI/GII NOT DETECTED NOT DETECTED Final   Rotavirus A NOT DETECTED NOT DETECTED Final   Sapovirus (I, II, IV, and V) NOT DETECTED NOT DETECTED Final    Comment: Performed at [facility], [address], [place], [place] [address]      Radiology Studies: DG CHEST PORT 1 VIEW  Result Date: 05/22/2020 CLINICAL DATA:  Hypoxia. EXAM: PORTABLE CHEST 1 VIEW COMPARISON:  05/18/2020 FINDINGS: Lung volumes are low. Heart size and mediastinal contours are unremarkable. Atelectasis is identified within the left base. No pleural effusion or interstitial edema identified. No airspace consolidation. IMPRESSION: Low lung volumes and left base atelectasis. Electronically Signed   By: Kerby Moors M.D.   On: 05/22/2020 05:22   EEG adult  Result Date: 05/22/2020 Lora Havens, MD     05/22/2020  2:08 PM Patient Name: Terry Palmer MRN: [id number] Epilepsy Attending: Lora Havens Referring Physician/Provider: Dr Irene Pap Date: 05/22/2020 Duration: 23.45 mins Patient history: 45yo M with h/o seizures, worsening mentation. EEG to evaluate for seizure Level of alertness:  lethargic AEDs during EEG study: LEV Technical aspects: This EEG study was done with scalp electrodes positioned according to the 10-20 International system of electrode placement. Electrical activity was acquired at a sampling rate of 500Hz  and reviewed with a high frequency filter of 70Hz  and a low frequency filter of 1Hz . EEG data were recorded continuously and digitally stored. Description: No posterior dominant rhythm was seen. EEG showed continuous generalized 3 to 6 Hz theta-delta slowing. Hyperventilation and photic stimulation were not performed.    ABNORMALITY - Continuous slow, generalized IMPRESSION: This study is suggestive of moderate diffuse encephalopathy, nonspecific etiology. No seizures or epileptiform discharges were seen throughout the recording. Priyanka [name]    Scheduled Meds: . vitamin C  500 mg Per Tube Daily  . baclofen  20 mg Per Tube QID  . buPROPion  75 mg Per Tube TID  . Chlorhexidine Gluconate Cloth  6 each Topical Daily  . dantrolene  50 mg Per Tube 4 times per day  . feeding supplement (PROSource TF)  90 mL Per Tube BID  . folic acid  1 mg Per Tube Daily  . free water  240 mL Per Tube Q4H  . insulin aspart  0-15 Units Subcutaneous Q4H  . insulin detemir  20 Units Subcutaneous Daily  . levETIRAcetam  1,000 mg Per Tube TID  . multivitamin with minerals  1 tablet Per Tube Daily  . nutrition supplement (JUVEN)  1 packet Per Tube BID BM  . pantoprazole sodium  40 mg Per Tube Daily  . polyvinyl alcohol  1 drop Both Eyes BID  . vitamin B-12  500  mcg Per Tube Daily   Continuous Infusions: . sodium chloride 100 mL/hr at 05/23/20 0600  . ampicillin-sulbactam (UNASYN) IV 200 mL/hr at 05/23/20 0600  . DAPTOmycin (CUBICIN)  IV Stopped (05/22/20 2041)  . feeding supplement (JEVITY 1.5 CAL/FIBER) 1,000 mL (05/22/20 0930)     LOS: 5 days   Time spent: 30 minutes   Darliss Cheney, MD [facility]  05/23/2020, 9:42 AM   How to contact the [facility] Attending or Consulting provider [place] or covering provider during after hours [place], for this patient?  1. Check the care team in [facility] and look for a) attending/consulting [facility] provider listed and b) the [facility] team listed. Page or secure chat [phone]. 2. Log into [email or web address] and use [place]'s universal password to access. If you do not have the password, please contact the hospital operator. 3. Locate the [facility] provider you are looking for under [facility] and page to a number that you can be directly reached. 4. If you still have difficulty reaching the  provider, please page the [facility] (Director on Call) for the Hospitalists listed on amion for assistance.

## 2020-05-24 ENCOUNTER — Inpatient Hospital Stay (HOSPITAL_COMMUNITY): Payer: Medicare HMO

## 2020-05-24 ENCOUNTER — Inpatient Hospital Stay: Payer: Self-pay

## 2020-05-24 DIAGNOSIS — A419 Sepsis, unspecified organism: Secondary | ICD-10-CM | POA: Diagnosis not present

## 2020-05-24 LAB — CBC WITH DIFFERENTIAL/PLATELET
Abs Immature Granulocytes: 0.17 10*3/uL — ABNORMAL HIGH (ref 0.00–0.07)
Basophils Absolute: 0.1 10*3/uL (ref 0.0–0.1)
Basophils Relative: 1 %
Eosinophils Absolute: 0.4 10*3/uL (ref 0.0–0.5)
Eosinophils Relative: 3 %
HCT: 33.4 % — ABNORMAL LOW (ref 39.0–52.0)
Hemoglobin: 10.6 g/dL — ABNORMAL LOW (ref 13.0–17.0)
Immature Granulocytes: 1 %
Lymphocytes Relative: 22 %
Lymphs Abs: 3 10*3/uL (ref 0.7–4.0)
MCH: 29.5 pg (ref 26.0–34.0)
MCHC: 31.7 g/dL (ref 30.0–36.0)
MCV: 93 fL (ref 80.0–100.0)
Monocytes Absolute: 1 10*3/uL (ref 0.1–1.0)
Monocytes Relative: 8 %
Neutro Abs: 8.9 10*3/uL — ABNORMAL HIGH (ref 1.7–7.7)
Neutrophils Relative %: 65 %
Platelets: 457 10*3/uL — ABNORMAL HIGH (ref 150–400)
RBC: 3.59 MIL/uL — ABNORMAL LOW (ref 4.22–5.81)
RDW: 13.4 % (ref 11.5–15.5)
WBC: 13.7 10*3/uL — ABNORMAL HIGH (ref 4.0–10.5)
nRBC: 0 % (ref 0.0–0.2)

## 2020-05-24 LAB — GLUCOSE, CAPILLARY
Glucose-Capillary: 156 mg/dL — ABNORMAL HIGH (ref 70–99)
Glucose-Capillary: 151 mg/dL — ABNORMAL HIGH (ref 70–99)
Glucose-Capillary: 137 mg/dL — ABNORMAL HIGH (ref 70–99)
Glucose-Capillary: 157 mg/dL — ABNORMAL HIGH (ref 70–99)
Glucose-Capillary: 195 mg/dL — ABNORMAL HIGH (ref 70–99)
Glucose-Capillary: 190 mg/dL — ABNORMAL HIGH (ref 70–99)

## 2020-05-24 MED ORDER — ALPRAZOLAM 0.25 MG PO TABS
0.2500 mg | ORAL_TABLET | Freq: Three times a day (TID) | ORAL | Status: DC
  Administered 2020-05-24 – 2020-05-27 (×10): 0.25 mg via ORAL
  Filled 2020-05-24 (×10): qty 1

## 2020-05-24 MED ORDER — DIATRIZOATE MEGLUMINE & SODIUM 66-10 % PO SOLN
ORAL | Status: AC
  Administered 2020-05-24: 30 mL via GASTROSTOMY
  Filled 2020-05-24: qty 30

## 2020-05-24 MED ORDER — SODIUM CHLORIDE 0.9 % IV SOLN: INTRAVENOUS | Status: DC | PRN

## 2020-05-24 NOTE — Progress Notes (Signed)
Physical Therapy Wound Treatment Patient Details  Name: Terry Palmer MRN: [id number] Date of Birth: Aug 19, 1975  Today's Date: 05/24/2020 Time: 3383-2919 Time Calculation (min): 41 min  Subjective  Subjective Assessment Subjective: None - pt asleep throughout the entirety of session and nonverbal at baseline Patient and Family Stated Goals: None Date of Onset:  (Unknown) Prior Treatments:  (Dressing changes)  Pain Score:  Pt tolerated treatment well without any indication of pain.    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 (Active)  Dressing Type ABD;Barrier Film (skin prep);Gauze (Comment);Moist to moist;Santyl 05/24/20 1315  Dressing Changed;Clean;Dry;Intact 05/24/20 1315  Dressing Change Frequency Daily 05/24/20 1315  State of Healing Early/partial granulation 05/24/20 1315  Site / Wound Assessment Black;Yellow;Pink 05/24/20 1315  % Wound base Red or Granulating 40% 05/24/20 1315  % Wound base Yellow/Fibrinous Exudate 40% 05/24/20 1315  % Wound base Black/Eschar 20% 05/24/20 1315  % Wound base Other/Granulation Tissue (Comment) 0% 05/24/20 1315  Peri-wound Assessment Intact;Bleeding 05/24/20 1315  Wound Length (cm) 9 cm 05/20/20 1325  Wound Width (cm) 8.5 cm 05/20/20 1325  Wound Depth (cm) 4.5 cm 05/20/20 1325  Wound Surface Area (cm^2) 76.5 cm^2 05/20/20 1325  Wound Volume (cm^3) 344.25 cm^3 05/20/20 1325  Tunneling (cm) 5 05/20/20 1325  Undermining (cm) (varying up to 4.5 cm 05/20/20 1325  Margins Unattached edges (unapproximated) 05/24/20 1315  Drainage Amount Minimal 05/24/20 1315  Drainage Description Serosanguineous 05/24/20 1315  Treatment Debridement (Selective);Hydrotherapy (Pulse lavage);Packing (Saline gauze) 05/24/20 1315   Hydrotherapy Pulsed lavage therapy - wound location: L ischial tuberosity Pulsed Lavage with Suction (psi): 8 psi (to 12) Pulsed Lavage  with Suction - Normal Saline Used: 1000 mL Pulsed Lavage Tip: Tip with splash shield Selective Debridement Selective Debridement - Location: L ischial tuberosity Selective Debridement - Tools Used: Forceps,Scalpel Selective Debridement - Tissue Removed: yellow unviable tissue    Wound Assessment and Plan  Wound Therapy - Assess/Plan/Recommendations Wound Therapy - Clinical Statement: Noted slightly more black necrotic tissue this session. Santyl added to [employer] plan of care. This patient will benefit from continued hydrotherapy for selective removal of unviable tissue, to decrease bioburden, and promote wound bed healing. Wound Therapy - Functional Problem List: Decreased strength, AROM, contractures, immobility due to MS Factors Delaying/Impairing Wound Healing: Immobility,Multiple medical problems,Incontinence Hydrotherapy Plan: Debridement,Dressing change,Pulsatile lavage with suction,Patient/family education Wound Therapy - Frequency: 6X / week Wound Therapy - Follow Up Recommendations: dressing changes by RN  Wound Therapy Goals- Improve the function of patient's integumentary system by progressing the wound(s) through the phases of wound healing (inflammation - proliferation - remodeling) by: Wound Therapy Goals - Improve the function of patient's integumentary system by progressing the wound(s) through the phases of wound healing by: Decrease Necrotic Tissue to: 20% Decrease Necrotic Tissue - Progress: Progressing toward goal Increase Granulation Tissue to: 80% Increase Granulation Tissue - Progress: Progressing toward goal Goals/treatment plan/discharge plan were made with and agreed upon by patient/family: Yes Time For Goal Achievement: 7 days Wound Therapy - Potential for Goals: Fair  Goals will be updated until maximal potential achieved or discharge criteria met.  Discharge criteria: when goals achieved, discharge from hospital, MD decision/surgical intervention, no progress  towards goals, refusal/missing three consecutive treatments without notification or medical reason.  GP     Charges PT Wound Care Charges $Wound Debridement up to 20 cm: < or equal to 20 cm $ Wound Debridement each add'l 20  sqcm: 2 $PT PLS Gun and Tip: 1 Supply $PT Hydrotherapy Visit: 1 Visit       Thelma Comp 05/24/2020, 1:19 PM   Rolinda Roan, PT, DPT Acute Rehabilitation Services Pager: [phone] Office: [phone]

## 2020-05-24 NOTE — Progress Notes (Signed)
PROGRESS NOTE    QUIENTIN Palmer  BDZ:[id number] DOB: 08-27-1975 DOA: 05/18/2020 PCP: [name], MD   Brief Narrative:  Terry Palmer is a 45 y.o. male with medical history significant of advanced multiple sclerosis, nonverbal at baseline, quadriplegic with contractures, neurogenic bladder with chronic suprapubic catheter, dysphagia with chronic indwelling PEG tube, dementia, history of seizures presented to the ED via EMS for evaluation of fevers and decubitus ulcer getting worse.  Wife states he has a chronic sacral wound which has been worsening for since March with more drainage.   In the ED, afebrile. Tachycardic with heart rate up to 120s and slightly tachypneic.  Not hypotensive.  Not hypoxic.  Labs showing WBC 19.3, hemoglobin 12.1 (stable compared to prior labs), platelet count 549K.  UA with negative nitrite, large amount of leukocytes, greater than 50 WBCs, and few bacteria.  Urine culture pending.  COVID and influenza panel negative.  Chest x-ray showing left lung base opacity suspicious for pneumonia.  Patient noted to have a large sacral wound.  CT showing significant progression and soft tissue wound overlying the left ischial tuberosity with underlying bony erosive changes consistent with osteomyelitis. Patient was given vancomycin, cefepime, and azithromycin and admitted under hospitalist service.  General surgery consulted who recommended hydrotherapy and then ID was consulted.  Early morning of 05/22/2020, he had acute change of mental status and he was not arousable.  He was transferred to ICU.  Did not require intubation or anything else.  Tube feedings were held.  He recovered soon.  General surgery signed off with no further surgical needs but recommended continuing hydrotherapy.  ID was consulted.  Assessment & Plan:   Principal Problem:   Sepsis ([place]) Active Problems:   Multiple sclerosis ([place])   UTI (urinary tract infection)   CAP (community acquired  pneumonia)   Osteomyelitis ([place])   Pressure injury of skin  Sepsis Secondary to large sacral wound with signs of osteomyelitis/aspiration pneumonia: CT showing significant progression and soft tissue wound overlying the left ischial tuberosity with underlying bony erosive changes consistent with osteomyelitis.General surgery consulted.  They did not recommend any intervention.  They have ordered hydrotherapy through PT. Last time he was febrile was around 4 days ago.  ID on board they recommend 6 weeks of IV antibiotics combined Zosyn and daptomycin.  Patient remains tachycardiac but it is improving.  Spoke to the wife Terry Palmer and according to her, his heart rate usually remains between 80 and 90.  No fever but persistent tachycardia.  According to wife, patient does have the capability to understand his surroundings and he tends to make an effort to communicate 1 way or the other but he is nonverbal.  I wonder if patient has underlying anxiety due to being in unfamiliar environment around unfamiliar people.  I will try Xanax scheduled to see if his heart rate comes down.  Now that he has been afebrile, I will order PICC line.  Patient should be ready for discharge in next 1 to 2 days.  Acute change in mental status: Patient back to baseline, awake and arousable but nonverbal at baseline.  EEG negative for any seizure activity.  Neurology signed off.  Type 2 diabetes mellitus: Due to hyperglycemia on the BMP, hemoglobin A1c was checked and it is 8.6.  Patient is now diagnosed with type 2 diabetes mellitus.  No prior history of diabetes.  Blood sugar controlled.  Continue Lantus 20 units and SSI.  Bacteremia vs contamination: Only 1 of  4 bottles is growing Staphylococcus.  Likely contamination.  Final identification pending.  Continue above antibiotics.  Hypokalemia: Resolved  Diarrhea: Likely related to recent antibiotic use.  He was also receiving Dulcolax and MiraLAX at home.  Abdominal exam benign.   C. difficile negative.  GI pathogen panel negative  Advanced underlying MS causing functional quadriplegia, dysphagia with chronic indwelling PEG, neurogenic bladder with chronic suprapubic catheter -Continue supportive care, Nurse suspects possible dislodgment of the PEG tube placement.  Will order abdominal x-ray to check position.  Seizure disorder -Continue Keppra  Goal of care: Discussed with wife about  CODE STATUS.  She wanted to continue his full code.  Palliative care also discussed with her but she wants to continue full code.  DVT prophylaxis: SCDs Start: 05/18/20 2108   Code Status: Full Code  Family Communication: None present at bedside.  Discussed with his wife over the phone.  Status is: Inpatient  Remains inpatient appropriate because:Inpatient level of care appropriate due to severity of illness   Dispo: The patient is from: Home              Anticipated d/c is to: Home              Patient currently is not medically stable to d/c.   Difficult to place patient No        Estimated body mass index is 22.52 kg/m as calculated from the following:   Height as of this encounter: 6\' 4"  (1.93 m).   Weight as of this encounter: 83.9 kg.  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 (Active)  05/20/20 1346  Location: Ischial tuberosity  Location Orientation: Left  Staging: Stage 4 - Full thickness tissue loss with exposed bone, tendon or muscle.  Wound Description (Comments):   Present on Admission: Yes     Nutritional status:  Nutrition Problem: Increased nutrient needs Etiology: wound healing   Signs/Symptoms: estimated needs   Interventions: Refer to RD note for recommendations,Tube feeding,MVI    Consultants:   ID  General surgery  PCCM  Neurology  Procedures:   None  Antimicrobials:  Anti-infectives (From admission, onward)   Start     Dose/Rate Route  Frequency Ordered Stop   05/20/20 2000  DAPTOmycin (CUBICIN) 650 mg in sodium chloride 0.9 % IVPB        8 mg/kg  83.9 kg 126 mL/hr over 30 Minutes Intravenous Daily 05/20/20 1544     05/19/20 2000  azithromycin (ZITHROMAX) 500 mg in sodium chloride 0.9 % 250 mL IVPB  Status:  Discontinued        500 mg 250 mL/hr over 60 Minutes Intravenous Every 24 hours 05/18/20 2114 05/20/20 0901   05/19/20 1800  Ampicillin-Sulbactam (UNASYN) 3 g in sodium chloride 0.9 % 100 mL IVPB        3 g 200 mL/hr over 30 Minutes Intravenous Every 6 hours 05/19/20 1712     05/19/20 0800  vancomycin (VANCOREADY) IVPB 750 mg/150 mL  Status:  Discontinued        750 mg 150 mL/hr over 60 Minutes Intravenous Every 12 hours 05/18/20 2056 05/20/20 1544   05/19/20 0400  ceFEPIme (MAXIPIME) 2 g in sodium chloride 0.9 % 100 mL IVPB  Status:  Discontinued        2 g 200 mL/hr over 30 Minutes Intravenous Every 8 hours 05/18/20 2056 05/19/20 1711   05/18/20 1845  vancomycin (VANCOREADY) IVPB  1500 mg/300 mL        1,500 mg 150 mL/hr over 120 Minutes Intravenous  Once 05/18/20 1844 05/18/20 2219   05/18/20 1845  ceFEPIme (MAXIPIME) 2 g in sodium chloride 0.9 % 100 mL IVPB        2 g 200 mL/hr over 30 Minutes Intravenous  Once 05/18/20 1844 05/18/20 2002   05/18/20 1845  azithromycin (ZITHROMAX) 500 mg in sodium chloride 0.9 % 250 mL IVPB        500 mg 250 mL/hr over 60 Minutes Intravenous  Once 05/18/20 1844 05/18/20 2142         Subjective:  Patient seen and examined, still in the ICU.  Patient awake but nonverbal at his baseline.  Slightly tachycardic but looks comfortable.  Objective: Vitals:   05/24/20 0400 05/24/20 0500 05/24/20 0600 05/24/20 0725  BP: 119/74 99/67 110/67   Pulse: (!) 107 (!) 105 (!) 108   Resp: 18 16 18    Temp: 98.6 F (37 C)   98 F (36.7 C)  TempSrc: Axillary   Oral  SpO2: 100% 100% 100%   Weight:      Height:        Intake/Output Summary (Last 24 hours) at 05/24/2020 1018 Last data  filed at 05/24/2020 0934 Gross per 24 hour  Intake 1383.1 ml  Output 4625 ml  Net -3241.9 ml   Filed Weights   05/18/20 2157 05/18/20 2246  Weight: 83.9 kg 83.9 kg    Examination: General exam: Appears calm and comfortable, nonverbal but arousable Respiratory system: Clear to auscultation. Respiratory effort normal. Cardiovascular system: S1 & S2 heard, sinus tachycardia. No JVD, murmurs, rubs, gallops or clicks. No pedal edema. Gastrointestinal system: Abdomen is nondistended, soft and nontender with reducible ventral hernia. No organomegaly or masses felt. Normal bowel sounds heard. Central nervous system: Alert but not oriented, contracted extremities. Skin: Dressing in the sacral area.  Data Reviewed: I have personally reviewed following labs and imaging studies  CBC: Recent Labs  Lab 05/20/20 0149 05/21/20 0125 05/22/20 0219 05/23/20 0534 05/24/20 0119  WBC 16.8* 17.5* 15.1* 15.8* 13.7*  NEUTROABS 12.0* 11.9* 10.4* 11.0* 8.9*  HGB 11.1* 11.4* 10.5* 10.5* 10.6*  HCT 34.9* 36.3* 32.7* 33.0* 33.4*  MCV 91.8 91.7 91.3 91.2 93.0  PLT 478* 437* 472* 494* 485*   Basic Metabolic Panel: Recent Labs  Lab 05/19/20 0201 05/20/20 0149 05/21/20 0125 05/22/20 0219 05/23/20 0534  NA 135 134* 136 137 137  K 3.8 3.3* 3.8 3.2* 3.5  CL 101 104 106 106 106  CO2 23 21* 19* 22 25  GLUCOSE 140* 253* 197* 216* 137*  BUN 6 6 11 10 10   CREATININE <0.30* 0.40* 0.32* <0.30* <0.30*  CALCIUM 8.9 8.2* 8.4* 8.3* 8.3*  MG  --  2.1  --   --   --    GFR: CrCl cannot be calculated (This lab value cannot be used to calculate CrCl because it is not a number: <0.30). Liver Function Tests: Recent Labs  Lab 05/18/20 1804  AST 26  ALT 25  ALKPHOS 107  BILITOT 0.2*  PROT 7.6  ALBUMIN 2.7*   Recent Labs  Lab 05/18/20 1804  LIPASE 28   No results for input(s): AMMONIA in the last 168 hours. Coagulation Profile: Recent Labs  Lab 05/18/20 1804  INR 1.1   Cardiac Enzymes: Recent  Labs  Lab 05/20/20 1529  CKTOTAL 66   BNP (last 3 results) No results for input(s): PROBNP in the last 8760  hours. HbA1C: No results for input(s): HGBA1C in the last 72 hours. CBG: Recent Labs  Lab 05/23/20 1505 05/23/20 1926 05/23/20 2321 05/24/20 0345 05/24/20 0723  GLUCAP 182* 203* 183* 156* 151*   Lipid Profile: No results for input(s): CHOL, HDL, LDLCALC, TRIG, CHOLHDL, LDLDIRECT in the last 72 hours. Thyroid Function Tests: No results for input(s): TSH, T4TOTAL, FREET4, T3FREE, THYROIDAB in the last 72 hours. Anemia Panel: No results for input(s): VITAMINB12, FOLATE, FERRITIN, TIBC, IRON, RETICCTPCT in the last 72 hours. Sepsis Labs: Recent Labs  Lab 05/18/20 1804 05/18/20 1953  LATICACIDVEN 1.4 1.3    Recent Results (from the past 240 hour(s))  Blood Culture (routine x 2)     Status: Abnormal   Collection Time: 05/18/20  5:53 PM   Specimen: BLOOD  Result Value Ref Range Status   Specimen Description BLOOD SITE NOT SPECIFIED  Final   Special Requests   Final    BOTTLES DRAWN AEROBIC AND ANAEROBIC Blood Culture adequate volume   Culture  Setup Time   Final    GRAM POSITIVE COCCI IN CLUSTERS AEROBIC BOTTLE ONLY CRITICAL RESULT CALLED TO, READ BACK BY AND VERIFIED WITH: Salome Holmes PHARMD [id number] 05/19/20 A BROWNING    Culture (A)  Final    STAPHYLOCOCCUS HOMINIS THE SIGNIFICANCE OF ISOLATING THIS ORGANISM FROM A SINGLE SET OF BLOOD CULTURES WHEN MULTIPLE SETS ARE DRAWN IS UNCERTAIN. PLEASE NOTIFY THE MICROBIOLOGY DEPARTMENT WITHIN ONE WEEK IF SPECIATION AND SENSITIVITIES ARE REQUIRED. Performed at [facility], [place] [address], [place], [place] [address]    Report Status 05/20/2020 FINAL  Final  Blood Culture ID Panel (Reflexed)     Status: Abnormal   Collection Time: 05/18/20  5:53 PM  Result Value Ref Range Status   Enterococcus faecalis NOT DETECTED NOT DETECTED Final   Enterococcus Faecium NOT DETECTED NOT DETECTED Final   Listeria monocytogenes NOT  DETECTED NOT DETECTED Final   Staphylococcus species DETECTED (A) NOT DETECTED Final    Comment: CRITICAL RESULT CALLED TO, READ BACK BY AND VERIFIED WITH: Salome Holmes PHARMD [id number] 05/19/20 A BROWNING    Staphylococcus aureus (BCID) NOT DETECTED NOT DETECTED Final   Staphylococcus epidermidis NOT DETECTED NOT DETECTED Final   Staphylococcus lugdunensis NOT DETECTED NOT DETECTED Final   Streptococcus species NOT DETECTED NOT DETECTED Final   Streptococcus agalactiae NOT DETECTED NOT DETECTED Final   Streptococcus pneumoniae NOT DETECTED NOT DETECTED Final   Streptococcus pyogenes NOT DETECTED NOT DETECTED Final   A.calcoaceticus-baumannii NOT DETECTED NOT DETECTED Final   Bacteroides fragilis NOT DETECTED NOT DETECTED Final   Enterobacterales NOT DETECTED NOT DETECTED Final   Enterobacter cloacae complex NOT DETECTED NOT DETECTED Final   Escherichia coli NOT DETECTED NOT DETECTED Final   Klebsiella aerogenes NOT DETECTED NOT DETECTED Final   Klebsiella oxytoca NOT DETECTED NOT DETECTED Final   Klebsiella pneumoniae NOT DETECTED NOT DETECTED Final   Proteus species NOT DETECTED NOT DETECTED Final   Salmonella species NOT DETECTED NOT DETECTED Final   Serratia marcescens NOT DETECTED NOT DETECTED Final   Haemophilus influenzae NOT DETECTED NOT DETECTED Final   Neisseria meningitidis NOT DETECTED NOT DETECTED Final   Pseudomonas aeruginosa NOT DETECTED NOT DETECTED Final   Stenotrophomonas maltophilia NOT DETECTED NOT DETECTED Final   Candida albicans NOT DETECTED NOT DETECTED Final   Candida auris NOT DETECTED NOT DETECTED Final   Candida glabrata NOT DETECTED NOT DETECTED Final   Candida krusei NOT DETECTED NOT DETECTED Final   Candida parapsilosis NOT DETECTED  NOT DETECTED Final   Candida tropicalis NOT DETECTED NOT DETECTED Final   Cryptococcus neoformans/gattii NOT DETECTED NOT DETECTED Final    Comment: Performed at [facility], [address] [place], [place] [address]   Blood Culture (routine x 2)     Status: None   Collection Time: 05/18/20  6:24 PM   Specimen: BLOOD  Result Value Ref Range Status   Specimen Description BLOOD SITE NOT SPECIFIED  Final   Special Requests   Final    BOTTLES DRAWN AEROBIC AND ANAEROBIC Blood Culture adequate volume   Culture   Final    NO GROWTH 5 DAYS Performed at [facility], [address] [place], [place] [address]    Report Status 05/23/2020 FINAL  Final  Resp Panel by RT-PCR (Flu A&B, Covid) Nasopharyngeal Swab     Status: None   Collection Time: 05/18/20  8:06 PM   Specimen: Nasopharyngeal Swab; Nasopharyngeal(NP) swabs in vial transport medium  Result Value Ref Range Status   SARS Coronavirus 2 by RT PCR NEGATIVE NEGATIVE Final    Comment: (NOTE) SARS-CoV-2 target nucleic acids are NOT DETECTED.  The SARS-CoV-2 RNA is generally detectable in upper respiratory specimens during the acute phase of infection. The lowest concentration of SARS-CoV-2 viral copies this assay can detect is 138 copies/mL. A negative result does not preclude SARS-Cov-2 infection and should not be used as the sole basis for treatment or other patient management decisions. A negative result may occur with  improper specimen collection/handling, submission of specimen other than nasopharyngeal swab, presence of viral mutation(s) within the areas targeted by this assay, and inadequate number of viral copies(<138 copies/mL). A negative result must be combined with clinical observations, patient history, and epidemiological information. The expected result is Negative.  Fact Sheet for Patients:  BloggerCourse.com  Fact Sheet for Healthcare Providers:  SeriousBroker.it  This test is no t yet approved or cleared by the [place] FDA and  has been authorized for detection and/or diagnosis of SARS-CoV-2 by FDA under an Emergency Use Authorization (EUA). This EUA will remain   in effect (meaning this test can be used) for the duration of the COVID-19 declaration under Section 564(b)(1) of the Act, 21 U.S.C.section 360bbb-3(b)(1), unless the authorization is terminated  or revoked sooner.       Influenza A by PCR NEGATIVE NEGATIVE Final   Influenza B by PCR NEGATIVE NEGATIVE Final    Comment: (NOTE) The Xpert Xpress SARS-CoV-2/FLU/RSV plus assay is intended as an aid in the diagnosis of influenza from Nasopharyngeal swab specimens and should not be used as a sole basis for treatment. Nasal washings and aspirates are unacceptable for Xpert Xpress SARS-CoV-2/FLU/RSV testing.  Fact Sheet for Patients: BloggerCourse.com  Fact Sheet for Healthcare Providers: SeriousBroker.it  This test is not yet approved or cleared by the [place] FDA and has been authorized for detection and/or diagnosis of SARS-CoV-2 by FDA under an Emergency Use Authorization (EUA). This EUA will remain in effect (meaning this test can be used) for the duration of the COVID-19 declaration under Section 564(b)(1) of the Act, 21 U.S.C. section 360bbb-3(b)(1), unless the authorization is terminated or revoked.  Performed at [facility], [address] [place], [place] [address]   C Difficile Quick Screen w PCR reflex     Status: None   Collection Time: 05/18/20 10:28 PM   Specimen: STOOL  Result Value Ref Range Status   C Diff antigen NEGATIVE NEGATIVE Final  C Diff toxin NEGATIVE NEGATIVE Final   C Diff interpretation No C. difficile detected.  Final    Comment: Performed at [facility], [place] [address], [place], [place] [address]  Gastrointestinal Panel by PCR , Stool     Status: None   Collection Time: 05/18/20 10:28 PM   Specimen: STOOL  Result Value Ref Range Status   Campylobacter species NOT DETECTED NOT DETECTED Final   Plesimonas shigelloides NOT DETECTED NOT DETECTED Final   Salmonella species NOT  DETECTED NOT DETECTED Final   Yersinia enterocolitica NOT DETECTED NOT DETECTED Final   Vibrio species NOT DETECTED NOT DETECTED Final   Vibrio cholerae NOT DETECTED NOT DETECTED Final   Enteroaggregative E coli (EAEC) NOT DETECTED NOT DETECTED Final   Enteropathogenic E coli (EPEC) NOT DETECTED NOT DETECTED Final   Enterotoxigenic E coli (ETEC) NOT DETECTED NOT DETECTED Final   Shiga like toxin producing E coli (STEC) NOT DETECTED NOT DETECTED Final   Shigella/Enteroinvasive E coli (EIEC) NOT DETECTED NOT DETECTED Final   Cryptosporidium NOT DETECTED NOT DETECTED Final   Cyclospora cayetanensis NOT DETECTED NOT DETECTED Final   Entamoeba histolytica NOT DETECTED NOT DETECTED Final   Giardia lamblia NOT DETECTED NOT DETECTED Final   Adenovirus F40/41 NOT DETECTED NOT DETECTED Final   Astrovirus NOT DETECTED NOT DETECTED Final   Norovirus GI/GII NOT DETECTED NOT DETECTED Final   Rotavirus A NOT DETECTED NOT DETECTED Final   Sapovirus (I, II, IV, and V) NOT DETECTED NOT DETECTED Final    Comment: Performed at [facility], [address], [place], [place] [address]      Radiology Studies: No results found.  Scheduled Meds: . vitamin C  500 mg Per Tube Daily  . baclofen  20 mg Per Tube QID  . buPROPion  75 mg Per Tube TID  . Chlorhexidine Gluconate Cloth  6 each Topical Daily  . collagenase   Topical Daily  . dantrolene  50 mg Per Tube 4 times per day  . diatrizoate meglumine-sodium      . feeding supplement (PROSource TF)  90 mL Per Tube BID  . folic acid  1 mg Per Tube Daily  . free water  240 mL Per Tube Q4H  . insulin aspart  0-15 Units Subcutaneous Q4H  . insulin detemir  20 Units Subcutaneous Daily  . levETIRAcetam  1,000 mg Per Tube TID  . multivitamin with minerals  1 tablet Per Tube Daily  . nutrition supplement (JUVEN)  1 packet Per Tube BID BM  . pantoprazole sodium  40 mg Per Tube Daily  . polyvinyl alcohol  1 drop Both Eyes BID  . vitamin B-12  500 mcg  Per Tube Daily   Continuous Infusions: . sodium chloride    . ampicillin-sulbactam (UNASYN) IV 3 g (05/24/20 0603)  . DAPTOmycin (CUBICIN)  IV Stopped (05/23/20 1959)  . feeding supplement (JEVITY 1.5 CAL/FIBER) 1,000 mL (05/23/20 1532)     LOS: 6 days   Time spent: 29 minutes   Darliss Cheney, MD [facility]  05/24/2020, 10:18 AM   How to contact the [facility] Attending or Consulting provider [place] or covering provider during after hours [place], for this patient?  1. Check the care team in [facility] and look for a) attending/consulting [facility] provider listed and b) the [facility] team listed. Page or secure chat [phone]. 2. Log into [email or web address] and use [place]'s universal password to access. If you do not have the password, please  contact the hospital operator. 3. Locate the [facility] provider you are looking for under [facility] and page to a number that you can be directly reached. 4. If you still have difficulty reaching the provider, please page the [facility] (Director on Call) for the Hospitalists listed on amion for assistance.

## 2020-05-24 NOTE — Progress Notes (Signed)
Assessed patient fo PICC insertion.Unable to place at bedside due to contracture of both arms.Unable to move either arm out into a position to access vein for PICC. If PICC is still desired recommend referral  to Interventional Radiology.RN aware.

## 2020-05-24 NOTE — Progress Notes (Signed)
   05/24/20 1244  Assess: MEWS Score  Temp 99.2 F (37.3 C)  BP 130/78  Pulse Rate (!) 115  SpO2 95 %  Assess: MEWS Score  MEWS Temp 0  MEWS Systolic 0  MEWS Pulse 2  MEWS RR 0  MEWS LOC 2  MEWS Score 4  MEWS Score Color Red  Assess: if the MEWS score is Yellow or Red  Were vital signs taken at a resting state? Yes  Focused Assessment No change from prior assessment  Early Detection of Sepsis Score *See Row Information* Low  MEWS guidelines implemented *See Row Information* No, previously red, continue vital signs every 4 hours  Notify: Charge Nurse/RN  Name of Charge Nurse/RN Notified Gaspar Bidding, RN  Date Charge Nurse/RN Notified 05/24/20  Time Charge Nurse/RN Notified 1244  Document  Patient Outcome Stabilized after interventions  Progress note created (see row info) Yes   Patient remains red mews at his baseline. Previously on 6N before transferring to 16M. Patient will be q4 VS for the duration of his stay.

## 2020-05-24 NOTE — Progress Notes (Signed)
   05/24/20 1801  Vitals  Temp 99.2 F (37.3 C)  BP 115/73  MAP (mmHg) 83  BP Method Automatic  Pulse Rate (!) 115  Pulse Rate Source Monitor  MEWS COLOR  MEWS Score Color Yellow  Oxygen Therapy  SpO2 100 %  PAINAD (Pain Assessment in Advanced Dementia)  Breathing 0  Negative Vocalization 0  Facial Expression 0  Body Language 0  Consolability 0  PAINAD Score 0  Critical Care Pain Observation Tool (CPOT)  Facial Expression 0  Body Movements 0  Muscle Tension 0  Compliance with ventilator (intubated pts.) N/A  Vocalization (extubated pts.) N/A  CPOT Total 0  MEWS Score  MEWS Temp 0  MEWS Systolic 0  MEWS Pulse 2  MEWS RR 0  MEWS LOC 1  MEWS Score 3  Patient was red mews at his baseline. Patient will be q4 VS for the duration of his stay.

## 2020-05-25 DIAGNOSIS — G35 Multiple sclerosis: Secondary | ICD-10-CM | POA: Diagnosis not present

## 2020-05-25 DIAGNOSIS — M869 Osteomyelitis, unspecified: Secondary | ICD-10-CM | POA: Diagnosis not present

## 2020-05-25 LAB — BASIC METABOLIC PANEL
Anion gap: 10 (ref 5–15)
BUN: 14 mg/dL (ref 6–20)
CO2: 24 mmol/L (ref 22–32)
Calcium: 8.6 mg/dL — ABNORMAL LOW (ref 8.9–10.3)
Chloride: 102 mmol/L (ref 98–111)
Creatinine, Ser: 0.35 mg/dL — ABNORMAL LOW (ref 0.61–1.24)
GFR, Estimated: >60 mL/min (ref >60)
Glucose, Bld: 180 mg/dL — ABNORMAL HIGH (ref 70–99)
Potassium: 4.3 mmol/L (ref 3.5–5.1)
Sodium: 136 mmol/L (ref 135–145)

## 2020-05-25 LAB — GLUCOSE, CAPILLARY
Glucose-Capillary: 212 mg/dL — ABNORMAL HIGH (ref 70–99)
Glucose-Capillary: 186 mg/dL — ABNORMAL HIGH (ref 70–99)
Glucose-Capillary: 150 mg/dL — ABNORMAL HIGH (ref 70–99)
Glucose-Capillary: 184 mg/dL — ABNORMAL HIGH (ref 70–99)
Glucose-Capillary: 203 mg/dL — ABNORMAL HIGH (ref 70–99)

## 2020-05-25 LAB — CBC WITH DIFFERENTIAL/PLATELET
Abs Immature Granulocytes: 0.15 10*3/uL — ABNORMAL HIGH (ref 0.00–0.07)
Basophils Absolute: 0.1 10*3/uL (ref 0.0–0.1)
Basophils Relative: 1 %
Eosinophils Absolute: 0.6 10*3/uL — ABNORMAL HIGH (ref 0.0–0.5)
Eosinophils Relative: 5 %
HCT: 32.6 % — ABNORMAL LOW (ref 39.0–52.0)
Hemoglobin: 10.6 g/dL — ABNORMAL LOW (ref 13.0–17.0)
Immature Granulocytes: 1 %
Lymphocytes Relative: 23 %
Lymphs Abs: 3.1 10*3/uL (ref 0.7–4.0)
MCH: 29.4 pg (ref 26.0–34.0)
MCHC: 32.5 g/dL (ref 30.0–36.0)
MCV: 90.3 fL (ref 80.0–100.0)
Monocytes Absolute: 1 10*3/uL (ref 0.1–1.0)
Monocytes Relative: 8 %
Neutro Abs: 8.2 10*3/uL — ABNORMAL HIGH (ref 1.7–7.7)
Neutrophils Relative %: 62 %
Platelets: 527 10*3/uL — ABNORMAL HIGH (ref 150–400)
RBC: 3.61 MIL/uL — ABNORMAL LOW (ref 4.22–5.81)
RDW: 13.4 % (ref 11.5–15.5)
WBC: 13.1 10*3/uL — ABNORMAL HIGH (ref 4.0–10.5)
nRBC: 0 % (ref 0.0–0.2)

## 2020-05-25 MED ORDER — JEVITY 1.5 CAL/FIBER PO LIQD
1000.0000 mL | ORAL | Status: DC
  Administered 2020-05-26 – 2020-05-31 (×8): 1000 mL
  Filled 2020-05-25 (×8): qty 1000

## 2020-05-25 NOTE — Progress Notes (Signed)
Physical Therapy Wound Treatment Patient Details  Name: Terry Palmer MRN: [id number] Date of Birth: 22-Jan-1975  Today's Date: 05/25/2020 Time: 0930-1015 Time Calculation (min): 45 min  Subjective  Subjective Assessment Subjective: None - pt obtunded throughout the entirety of session and nonverbal at baseline Patient and Family Stated Goals: None Date of Onset:  (Unknown) Prior Treatments:  (Dressing changes)  Pain Score:  Pt appeared to jerk a couple times during debridement, but overall did not appear to be in pain and appeared to be asleep throughout session.  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 (Active)  Dressing Type ABD;Barrier Film (skin prep);Gauze (Comment);Moist to moist;Santyl 05/25/20 1100  Dressing Changed;Clean;Dry;Intact 05/25/20 1100  Dressing Change Frequency Daily 05/25/20 1100  State of Healing Early/partial granulation 05/25/20 1100  Site / Wound Assessment Bleeding;Red;Black;Yellow 05/25/20 1100  % Wound base Red or Granulating 40% 05/25/20 1100  % Wound base Yellow/Fibrinous Exudate 40% 05/25/20 1100  % Wound base Black/Eschar 20% 05/25/20 1100  % Wound base Other/Granulation Tissue (Comment) 0% 05/25/20 1100  Peri-wound Assessment Intact;Bleeding 05/25/20 1100  Wound Length (cm) 9 cm 05/20/20 1325  Wound Width (cm) 8.5 cm 05/20/20 1325  Wound Depth (cm) 4.5 cm 05/20/20 1325  Wound Surface Area (cm^2) 76.5 cm^2 05/20/20 1325  Wound Volume (cm^3) 344.25 cm^3 05/20/20 1325  Tunneling (cm) 5 05/20/20 1325  Undermining (cm) (varying up to 4.5 cm 05/20/20 1325  Margins Unattached edges (unapproximated) 05/25/20 1100  Drainage Amount Minimal 05/25/20 1100  Drainage Description Serosanguineous 05/25/20 1100  Treatment Debridement (Selective);Hydrotherapy (Pulse lavage);Packing (Saline gauze) 05/25/20 1100   Hydrotherapy Pulsed lavage therapy - wound  location: L ischial tuberosity Pulsed Lavage with Suction (psi): 8 psi (to 12) Pulsed Lavage with Suction - Normal Saline Used: 1000 mL Pulsed Lavage Tip: Tip with splash shield Selective Debridement Selective Debridement - Location: L ischial tuberosity Selective Debridement - Tools Used: Forceps,Scalpel Selective Debridement - Tissue Removed: yellow and black unviable tissue    Wound Assessment and Plan  Wound Therapy - Assess/Plan/Recommendations Wound Therapy - Clinical Statement: Progressing with debridement. This patient will benefit from continued hydrotherapy for selective removal of unviable tissue, to decrease bioburden, and promote wound bed healing. Wound Therapy - Functional Problem List: Decreased strength, AROM, contractures, immobility due to MS Factors Delaying/Impairing Wound Healing: Immobility,Multiple medical problems,Incontinence Hydrotherapy Plan: Debridement,Dressing change,Pulsatile lavage with suction,Patient/family education Wound Therapy - Frequency: 6X / week Wound Therapy - Follow Up Recommendations: dressing changes by RN  Wound Therapy Goals- Improve the function of patient's integumentary system by progressing the wound(s) through the phases of wound healing (inflammation - proliferation - remodeling) by: Wound Therapy Goals - Improve the function of patient's integumentary system by progressing the wound(s) through the phases of wound healing by: Decrease Necrotic Tissue to: 20% Decrease Necrotic Tissue - Progress: Progressing toward goal Increase Granulation Tissue to: 80% Increase Granulation Tissue - Progress: Progressing toward goal Goals/treatment plan/discharge plan were made with and agreed upon by patient/family: Yes Time For Goal Achievement: 7 days Wound Therapy - Potential for Goals: Fair  Goals will be updated until maximal potential achieved or discharge criteria met.  Discharge criteria: when goals achieved, discharge from hospital, MD  decision/surgical intervention, no progress towards goals, refusal/missing three consecutive treatments without notification or medical reason.  GP     Charges PT Wound Care Charges $Wound Debridement up to 20 cm: < or equal to 20 cm $ Wound  Debridement each add'l 20 sqcm: 2 $PT PLS Gun and Tip: 1 Supply $PT Hydrotherapy Visit: 1 Visit       Thelma Comp 05/25/2020, 12:05 PM   Rolinda Roan, PT, DPT Acute Rehabilitation Services Pager: [phone] Office: [phone]

## 2020-05-25 NOTE — Progress Notes (Signed)
Called Terry Palmer, to let her know we can't weigh pt on the air mattress bed.  She said not to worry about the order to weigh him, then.

## 2020-05-25 NOTE — Progress Notes (Signed)
Pt YELLOW MEWS at beginning of shift. Pt now RED MEWS d/t heart rate and LOC.  Pt was a RED MEWS when on 6N last week, was YELLOW and RED in ICU.  Previous MD note stated pt likely to continue to be RED MEWS d/t LOC and pulse, no need to notify if these are the only causes and there is no change in pt assessment.

## 2020-05-25 NOTE — Care Management Important Message (Signed)
Important Message  Patient Details  Name: Terry Palmer MRN: [id number] Date of Birth: 1975-06-20   Medicare Important Message Given:  Yes     Orbie Pyo 05/25/2020, 2:54 PM

## 2020-05-25 NOTE — Progress Notes (Signed)
   05/25/20 1013  Assess: MEWS Score  Temp 98.8 F (37.1 C)  BP 130/84  Pulse Rate (!) 118  Resp 16  SpO2 100 %  O2 Device Nasal Cannula  Assess: MEWS Score  MEWS Temp 0  MEWS Systolic 0  MEWS Pulse 2  MEWS RR 0  MEWS LOC 2  MEWS Score 4  MEWS Score Color Red  Assess: if the MEWS score is Yellow or Red  Were vital signs taken at a resting state? Yes  Focused Assessment No change from prior assessment  Early Detection of Sepsis Score *See Row Information* Low  MEWS guidelines implemented *See Row Information* No, previously red, continue vital signs every 4 hours  Notify: Charge Nurse/RN  Name of Charge Nurse/RN Notified Daron Offer., RN  Date Charge Nurse/RN Notified 05/25/20  Time Charge Nurse/RN Notified 1028

## 2020-05-25 NOTE — Progress Notes (Signed)
Nutrition Follow-up  DOCUMENTATION CODES:   Not applicable  INTERVENTION:   Need new measured weight  Tube Feeding via G-tube:  Change to Intermittent feeding per Wife request Osmolite 1.5 at 80 ml/hr x 16 hours per day Continue Pro-Source 90 mL BID Provides 2080 kcals, 124 g of protein and 973 mL of free water  Continue Juven BID, each packet provides 80 calories, 8 grams of carbohydrate, 2.5  grams of protein (collagen), 7 grams of L-arginine and 7 grams of L-glutamine; supplement contains CaHMB, Vitamins C, E, B12 and Zinc to promote wound healing. Recommend Juven BID being continued at discharge   NUTRITION DIAGNOSIS:   Increased nutrient needs related to wound healing as evidenced by estimated needs.  Being addressed via TF   GOAL:   Patient will meet greater than or equal to 90% of their needs  Progressing  MONITOR:   TF tolerance,Skin,Weight trends  REASON FOR ASSESSMENT:   New TF    ASSESSMENT:   45 yo male admitted with sepsis due to large sacral wound with osteomyelitis. Pt with advanced MS, nonverbal at baseline, functional quad with contractures, dysphagia with PEG tube.   Noted concern regarding possible PEG tube dislodgement. Per abd xray 5/17, PEG tube in good position  Tolerating Osmolite 1.5 at 55 ml/hr over 24 hours via PEG  Wife, [name], at bedside and able to clarify home TF order. Wife reports TF at regimen is Osmolite 1.5 at 70 ml/hr which starts at 8am and usually finishes around midnight (16 hours). This provides 1680 kcals, 70 g of protein. Wife reports pt tolerating TF well at home. Wife also indicates she has been asking the MD for a prescription for ProMod (protein modular) but has not been able to get.   Receiving hydrotherapy for wound 9 cm x 8.5 cm x 4.5 cm. 40% wound bed red or granulating, 40% yellow/fibronous exudate, 20% black/eschar  Wife unsure if patient has lost or gained weight recently. Wife reports she believes his UBW to be  around 180-185 pounds. Current weight from this admission is 185 pounds from 5/11; unsure of this is measured or stated. No weight since admission weight. Request new weight  Discussed providing increased nutrition to patient secondary to increased needs related to sepsis and wound healing. Wife understands and is agreeable to plan.   Labs: reviewed Meds: ss novolog, levemir, MVI with Minerals, B-12, Vit C   Diet Order:   Diet Order            Diet NPO time specified  Diet effective now                 EDUCATION NEEDS:   Not appropriate for education at this time  Skin:  Skin Assessment: Reviewed RN Assessment  Last BM:  5/15  Height:   Ht Readings from Last 1 Encounters:  05/18/20 6\' 4"  (1.93 m)    Weight:   Wt Readings from Last 1 Encounters:  05/18/20 83.9 kg   BMI:  Body mass index is 22.52 kg/m.  Estimated Nutritional Needs:   Kcal:  2000-2200 kcals  Protein:  125-165 g  Fluid:  >/= 2 L   Kerman Passey MS, RDN, LDN, CNSC Registered Dietitian III Clinical Nutrition RD Pager and On-Call Pager Number Located in [place]

## 2020-05-25 NOTE — Progress Notes (Signed)
Terry Palmer  [id number] DOB: [birth year] DOA: 05/18/2020 PCP: [name], MD    Brief Narrative:  45 year old with a history of advanced MS with quadriplegia plegia/contractures neurogenic bladder and nonverbal at baseline, chronic suprapubic catheter, chronic indwelling PEG tube, dementia, and seizure disorder who presented to the ER with fevers and worsening of a known decubitus ulcer.  In the ER he was found to be tachycardic in the 120s but was not hypotensive.  WBC was elevated.  A large sacral wound was confirmed and CT of the area noted progression of the soft tissue wound overlying the left ischial tuberosity with underlying bony erosion consistent with osteomyelitis.  He has been evaluated by general surgery who did not feel there was a role for a procedure.  ID is following and empiric antibiotics are being dosed.  Consultants:  General surgery ID  Code Status: FULL CODE  Antimicrobials:  Cefepime 5/11 > 5/12  vancomycin 5/11 > 5/13  Unasyn 5/12 > Daptomycin 5/13 >  DVT prophylaxis: SCDs  Subjective: Afebrile.  Tachycardia persists.  Assessment & Plan:  Sepsis POA due to large sacral wound with osteomyelitis Continue hydrotherapy -ID recommended 6 weeks of IV antibiotic therapy with Zosyn and daptomycin  Acute altered mental status Patient was much less interactive at time of admission -EEG negative for acute seizure activity -has returned to baseline awake arousable but nonverbal status  DM2 A1c 8.6 -this is a new formal diagnosis for the patient -CBG slightly elevated above goal -adjust treatment and follow  Bacteremia versus contamination 1 of 4 bottles positive for staph -most consistent with contamination  Hypokalemia Corrected with supplementation  Diarrhea C. difficile negative -GI pathogen panel negative  Advanced chronic MS with functional quadriplegia, dysphagia/PEG tube dependence, neurogenic bladder with chronic suprapubic  catheter  Seizure disorder Continue Keppra  Goals of care Palliative care has been consulted but the patient's wife insist that he would wish to remain full code    Family Communication:  Status is: Inpatient  Remains inpatient appropriate because:Inpatient level of care appropriate due to severity of illness   Dispo: The patient is from: Home              Anticipated d/c is to: unclear              Patient currently is not medically stable to d/c.   Difficult to place patient No     Objective: Blood pressure 130/84, pulse (!) 118, temperature 98.8 F (37.1 C), temperature source Oral, resp. rate 16, height 6\' 4"  (1.93 m), weight 83.9 kg, SpO2 100 %.  Intake/Output Summary (Last 24 hours) at 05/25/2020 1021 Last data filed at 05/25/2020 0743 Gross per 24 hour  Intake 1400 ml  Output 1700 ml  Net -300 ml   Filed Weights   05/18/20 2157 05/18/20 2246  Weight: 83.9 kg 83.9 kg    Examination: General: No acute respiratory distress Lungs: Clear to auscultation bilaterally without wheezes or crackles Cardiovascular: Regular rate and rhythm without murmur gallop or rub normal S1 and S2 Abdomen: Nontender, nondistended, soft, bowel sounds positive, no rebound, no ascites, no appreciable mass Extremities: No significant cyanosis, clubbing, or edema bilateral lower extremities  CBC: Recent Labs  Lab 05/23/20 0534 05/24/20 0119 05/25/20 0711  WBC 15.8* 13.7* 13.1*  NEUTROABS 11.0* 8.9* 8.2*  HGB 10.5* 10.6* 10.6*  HCT 33.0* 33.4* 32.6*  MCV 91.2 93.0 90.3  PLT 494* 457* 123456*   Basic Metabolic Panel: Recent Labs  Lab 05/20/20  0149 05/21/20 0125 05/22/20 0219 05/23/20 0534 05/25/20 0711  NA 134*   < > 137 137 136  K 3.3*   < > 3.2* 3.5 4.3  CL 104   < > 106 106 102  CO2 21*   < > 22 25 24   GLUCOSE 253*   < > 216* 137* 180*  BUN 6   < > 10 10 14   CREATININE 0.40*   < > <0.30* <0.30* 0.35*  CALCIUM 8.2*   < > 8.3* 8.3* 8.6*  MG 2.1  --   --   --   --    < >  = values in this interval not displayed.   GFR: Estimated Creatinine Clearance: 136.9 mL/min (A) (by C-G formula based on SCr of 0.35 mg/dL (L)).  Liver Function Tests: Recent Labs  Lab 05/18/20 1804  AST 26  ALT 25  ALKPHOS 107  BILITOT 0.2*  PROT 7.6  ALBUMIN 2.7*   Recent Labs  Lab 05/18/20 1804  LIPASE 28    Coagulation Profile: Recent Labs  Lab 05/18/20 1804  INR 1.1    Cardiac Enzymes: Recent Labs  Lab 05/20/20 1529  CKTOTAL 66    HbA1C: Hgb A1c MFr Bld  Date/Time Value Ref Range Status  05/20/2020 08:27 AM 8.6 (H) 4.8 - 5.6 % Final    Comment:    (NOTE) Pre diabetes:          5.7%-6.4%  Diabetes:              >6.4%  Glycemic control for   <7.0% adults with diabetes     CBG: Recent Labs  Lab 05/24/20 1551 05/24/20 2007 05/24/20 2320 05/25/20 0431 05/25/20 0812  GLUCAP 157* 195* 190* 212* 186*    Recent Results (from the past 240 hour(s))  Blood Culture (routine x 2)     Status: Abnormal   Collection Time: 05/18/20  5:53 PM   Specimen: BLOOD  Result Value Ref Range Status   Specimen Description BLOOD SITE NOT SPECIFIED  Final   Special Requests   Final    BOTTLES DRAWN AEROBIC AND ANAEROBIC Blood Culture adequate volume   Culture  Setup Time   Final    GRAM POSITIVE COCCI IN CLUSTERS AEROBIC BOTTLE ONLY CRITICAL RESULT CALLED TO, READ BACK BY AND VERIFIED WITH: Salome Holmes PHARMD [id number] 05/19/20 A BROWNING    Culture (A)  Final    STAPHYLOCOCCUS HOMINIS THE SIGNIFICANCE OF ISOLATING THIS ORGANISM FROM A SINGLE SET OF BLOOD CULTURES WHEN MULTIPLE SETS ARE DRAWN IS UNCERTAIN. PLEASE NOTIFY THE MICROBIOLOGY DEPARTMENT WITHIN ONE WEEK IF SPECIATION AND SENSITIVITIES ARE REQUIRED. Performed at [facility], [place] [address] [place], [place] [address]    Report Status 05/20/2020 FINAL  Final  Blood Culture ID Panel (Reflexed)     Status: Abnormal   Collection Time: 05/18/20  5:53 PM  Result Value Ref Range Status   Enterococcus  faecalis NOT DETECTED NOT DETECTED Final   Enterococcus Faecium NOT DETECTED NOT DETECTED Final   Listeria monocytogenes NOT DETECTED NOT DETECTED Final   Staphylococcus species DETECTED (A) NOT DETECTED Final    Comment: CRITICAL RESULT CALLED TO, READ BACK BY AND VERIFIED WITH: Salome Holmes PHARMD [id number] 05/19/20 A BROWNING    Staphylococcus aureus (BCID) NOT DETECTED NOT DETECTED Final   Staphylococcus epidermidis NOT DETECTED NOT DETECTED Final   Staphylococcus lugdunensis NOT DETECTED NOT DETECTED Final   Streptococcus species NOT DETECTED NOT DETECTED Final   Streptococcus agalactiae NOT  DETECTED NOT DETECTED Final   Streptococcus pneumoniae NOT DETECTED NOT DETECTED Final   Streptococcus pyogenes NOT DETECTED NOT DETECTED Final   A.calcoaceticus-baumannii NOT DETECTED NOT DETECTED Final   Bacteroides fragilis NOT DETECTED NOT DETECTED Final   Enterobacterales NOT DETECTED NOT DETECTED Final   Enterobacter cloacae complex NOT DETECTED NOT DETECTED Final   Escherichia coli NOT DETECTED NOT DETECTED Final   Klebsiella aerogenes NOT DETECTED NOT DETECTED Final   Klebsiella oxytoca NOT DETECTED NOT DETECTED Final   Klebsiella pneumoniae NOT DETECTED NOT DETECTED Final   Proteus species NOT DETECTED NOT DETECTED Final   Salmonella species NOT DETECTED NOT DETECTED Final   Serratia marcescens NOT DETECTED NOT DETECTED Final   Haemophilus influenzae NOT DETECTED NOT DETECTED Final   Neisseria meningitidis NOT DETECTED NOT DETECTED Final   Pseudomonas aeruginosa NOT DETECTED NOT DETECTED Final   Stenotrophomonas maltophilia NOT DETECTED NOT DETECTED Final   Candida albicans NOT DETECTED NOT DETECTED Final   Candida auris NOT DETECTED NOT DETECTED Final   Candida glabrata NOT DETECTED NOT DETECTED Final   Candida krusei NOT DETECTED NOT DETECTED Final   Candida parapsilosis NOT DETECTED NOT DETECTED Final   Candida tropicalis NOT DETECTED NOT DETECTED Final   Cryptococcus neoformans/gattii  NOT DETECTED NOT DETECTED Final    Comment: Performed at [facility], [address], [place], [place] [address]  Blood Culture (routine x 2)     Status: None   Collection Time: 05/18/20  6:24 PM   Specimen: BLOOD  Result Value Ref Range Status   Specimen Description BLOOD SITE NOT SPECIFIED  Final   Special Requests   Final    BOTTLES DRAWN AEROBIC AND ANAEROBIC Blood Culture adequate volume   Culture   Final    NO GROWTH 5 DAYS Performed at [facility], [address], [place], [place] [address]    Report Status 05/23/2020 FINAL  Final  Resp Panel by RT-PCR (Flu A&B, Covid) Nasopharyngeal Swab     Status: None   Collection Time: 05/18/20  8:06 PM   Specimen: Nasopharyngeal Swab; Nasopharyngeal(NP) swabs in vial transport medium  Result Value Ref Range Status   SARS Coronavirus 2 by RT PCR NEGATIVE NEGATIVE Final    Comment: (NOTE) SARS-CoV-2 target nucleic acids are NOT DETECTED.  The SARS-CoV-2 RNA is generally detectable in upper respiratory specimens during the acute phase of infection. The lowest concentration of SARS-CoV-2 viral copies this assay can detect is 138 copies/mL. A negative result does not preclude SARS-Cov-2 infection and should not be used as the sole basis for treatment or other patient management decisions. A negative result may occur with  improper specimen collection/handling, submission of specimen other than nasopharyngeal swab, presence of viral mutation(s) within the areas targeted by this assay, and inadequate number of viral copies(<138 copies/mL). A negative result must be combined with clinical observations, patient history, and epidemiological information. The expected result is Negative.  Fact Sheet for Patients:  EntrepreneurPulse.com.au  Fact Sheet for Healthcare Providers:  IncredibleEmployment.be  This test is no t yet approved or cleared by the [place] FDA and  has been authorized  for detection and/or diagnosis of SARS-CoV-2 by FDA under an Emergency Use Authorization (EUA). This EUA will remain  in effect (meaning this test can be used) for the duration of the COVID-19 declaration under Section 564(b)(1) of the Act, 21 U.S.C.section 360bbb-3(b)(1), unless the authorization is terminated  or revoked sooner.       Influenza A  by PCR NEGATIVE NEGATIVE Final   Influenza B by PCR NEGATIVE NEGATIVE Final    Comment: (NOTE) The Xpert Xpress SARS-CoV-2/FLU/RSV plus assay is intended as an aid in the diagnosis of influenza from Nasopharyngeal swab specimens and should not be used as a sole basis for treatment. Nasal washings and aspirates are unacceptable for Xpert Xpress SARS-CoV-2/FLU/RSV testing.  Fact Sheet for Patients: EntrepreneurPulse.com.au  Fact Sheet for Healthcare Providers: IncredibleEmployment.be  This test is not yet approved or cleared by the [place] FDA and has been authorized for detection and/or diagnosis of SARS-CoV-2 by FDA under an Emergency Use Authorization (EUA). This EUA will remain in effect (meaning this test can be used) for the duration of the COVID-19 declaration under Section 564(b)(1) of the Act, 21 U.S.C. section 360bbb-3(b)(1), unless the authorization is terminated or revoked.  Performed at [facility], [place] [address] [place], [place] [address]   C Difficile Quick Screen w PCR reflex     Status: None   Collection Time: 05/18/20 10:28 PM   Specimen: STOOL  Result Value Ref Range Status   C Diff antigen NEGATIVE NEGATIVE Final   C Diff toxin NEGATIVE NEGATIVE Final   C Diff interpretation No C. difficile detected.  Final    Comment: Performed at [facility], [place] [address], [place], [place] [address]  Gastrointestinal Panel by PCR , Stool     Status: None   Collection Time: 05/18/20 10:28 PM   Specimen: STOOL  Result Value Ref Range Status   Campylobacter species  NOT DETECTED NOT DETECTED Final   Plesimonas shigelloides NOT DETECTED NOT DETECTED Final   Salmonella species NOT DETECTED NOT DETECTED Final   Yersinia enterocolitica NOT DETECTED NOT DETECTED Final   Vibrio species NOT DETECTED NOT DETECTED Final   Vibrio cholerae NOT DETECTED NOT DETECTED Final   Enteroaggregative E coli (EAEC) NOT DETECTED NOT DETECTED Final   Enteropathogenic E coli (EPEC) NOT DETECTED NOT DETECTED Final   Enterotoxigenic E coli (ETEC) NOT DETECTED NOT DETECTED Final   Shiga like toxin producing E coli (STEC) NOT DETECTED NOT DETECTED Final   Shigella/Enteroinvasive E coli (EIEC) NOT DETECTED NOT DETECTED Final   Cryptosporidium NOT DETECTED NOT DETECTED Final   Cyclospora cayetanensis NOT DETECTED NOT DETECTED Final   Entamoeba histolytica NOT DETECTED NOT DETECTED Final   Giardia lamblia NOT DETECTED NOT DETECTED Final   Adenovirus F40/41 NOT DETECTED NOT DETECTED Final   Astrovirus NOT DETECTED NOT DETECTED Final   Norovirus GI/GII NOT DETECTED NOT DETECTED Final   Rotavirus A NOT DETECTED NOT DETECTED Final   Sapovirus (I, II, IV, and V) NOT DETECTED NOT DETECTED Final    Comment: Performed at [facility], [address] [place], [place] [address]     Scheduled Meds: . ALPRAZolam  0.25 mg Oral TID  . vitamin C  500 mg Per Tube Daily  . baclofen  20 mg Per Tube QID  . buPROPion  75 mg Per Tube TID  . Chlorhexidine Gluconate Cloth  6 each Topical Daily  . collagenase   Topical Daily  . dantrolene  50 mg Per Tube 4 times per day  . feeding supplement (PROSource TF)  90 mL Per Tube BID  . folic acid  1 mg Per Tube Daily  . free water  240 mL Per Tube Q4H  . insulin aspart  0-15 Units Subcutaneous Q4H  . insulin detemir  20 Units Subcutaneous Daily  . levETIRAcetam  1,000 mg  Per Tube TID  . multivitamin with minerals  1 tablet Per Tube Daily  . nutrition supplement (JUVEN)  1 packet Per Tube BID BM  . pantoprazole sodium  40 mg Per Tube Daily   . polyvinyl alcohol  1 drop Both Eyes BID  . vitamin B-12  500 mcg Per Tube Daily   Continuous Infusions: . sodium chloride    . ampicillin-sulbactam (UNASYN) IV 3 g (05/25/20 0442)  . DAPTOmycin (CUBICIN)  IV 650 mg (05/24/20 2120)  . feeding supplement (JEVITY 1.5 CAL/FIBER) 1,000 mL (05/24/20 2120)     LOS: 7 days   Cherene Altes, MD [facility] Office  [phone] Pager - Text Page per Amion  If 7PM-7AM, please contact night-coverage per Amion 05/25/2020, 10:21 AM

## 2020-05-25 NOTE — Progress Notes (Signed)
   05/25/20 1400  Assess: MEWS Score  Temp 98.2 F (36.8 C)  BP 108/67  Pulse Rate (!) 116  Resp 16  Level of Consciousness Responds to Voice  SpO2 100 %  O2 Device Nasal Cannula  Assess: MEWS Score  MEWS Temp 0  MEWS Systolic 0  MEWS Pulse 2  MEWS RR 0  MEWS LOC 1  MEWS Score 3  MEWS Score Color Yellow  Assess: if the MEWS score is Yellow or Red  Were vital signs taken at a resting state? Yes  Focused Assessment No change from prior assessment  Early Detection of Sepsis Score *See Row Information* Low  MEWS guidelines implemented *See Row Information* No, previously yellow, continue vital signs every 4 hours

## 2020-05-26 DIAGNOSIS — G35 Multiple sclerosis: Secondary | ICD-10-CM | POA: Diagnosis not present

## 2020-05-26 DIAGNOSIS — M869 Osteomyelitis, unspecified: Secondary | ICD-10-CM | POA: Diagnosis not present

## 2020-05-26 LAB — CBC WITH DIFFERENTIAL/PLATELET
Abs Immature Granulocytes: 0.11 10*3/uL — ABNORMAL HIGH (ref 0.00–0.07)
Basophils Absolute: 0.1 10*3/uL (ref 0.0–0.1)
Basophils Relative: 1 %
Eosinophils Absolute: 0.6 10*3/uL — ABNORMAL HIGH (ref 0.0–0.5)
Eosinophils Relative: 4 %
HCT: 33.8 % — ABNORMAL LOW (ref 39.0–52.0)
Hemoglobin: 10.6 g/dL — ABNORMAL LOW (ref 13.0–17.0)
Immature Granulocytes: 1 %
Lymphocytes Relative: 18 %
Lymphs Abs: 2.6 10*3/uL (ref 0.7–4.0)
MCH: 28.7 pg (ref 26.0–34.0)
MCHC: 31.4 g/dL (ref 30.0–36.0)
MCV: 91.6 fL (ref 80.0–100.0)
Monocytes Absolute: 1 10*3/uL (ref 0.1–1.0)
Monocytes Relative: 7 %
Neutro Abs: 10.5 10*3/uL — ABNORMAL HIGH (ref 1.7–7.7)
Neutrophils Relative %: 69 %
Platelets: 554 10*3/uL — ABNORMAL HIGH (ref 150–400)
RBC: 3.69 MIL/uL — ABNORMAL LOW (ref 4.22–5.81)
RDW: 13.2 % (ref 11.5–15.5)
WBC: 14.9 10*3/uL — ABNORMAL HIGH (ref 4.0–10.5)
nRBC: 0 % (ref 0.0–0.2)

## 2020-05-26 LAB — GLUCOSE, CAPILLARY
Glucose-Capillary: 116 mg/dL — ABNORMAL HIGH (ref 70–99)
Glucose-Capillary: 173 mg/dL — ABNORMAL HIGH (ref 70–99)
Glucose-Capillary: 130 mg/dL — ABNORMAL HIGH (ref 70–99)
Glucose-Capillary: 182 mg/dL — ABNORMAL HIGH (ref 70–99)
Glucose-Capillary: 215 mg/dL — ABNORMAL HIGH (ref 70–99)
Glucose-Capillary: 190 mg/dL — ABNORMAL HIGH (ref 70–99)

## 2020-05-26 NOTE — Progress Notes (Signed)
Physical Therapy Wound Treatment Patient Details  Name: Terry Palmer MRN: [id number] Date of Birth: 04/03/1975  Today's Date: 05/26/2020 Time: 0037-0488 Time Calculation (min): 44 min  Subjective  Subjective Assessment Subjective: None - pt obtunded throughout the entirety of session and nonverbal at baseline Patient and Family Stated Goals: None Date of Onset:  (Unknown) Prior Treatments:  (Dressing changes)  Pain Score:  2  With debridement  Wound Assessment  Pressure Injury 05/20/20 Ischial tuberosity Left Stage 4 - Full thickness tissue loss with exposed bone, tendon or muscle. (Active)  Wound Image   05/23/20 1000  Dressing Type ABD;Barrier Film (skin prep);Gauze (Comment);Normal saline moist dressing 05/26/20 1133  Dressing Clean;Dry;Intact 05/25/20 2025  Dressing Change Frequency Twice a day 05/26/20 1133  State of Healing Eschar 05/26/20 1133  Site / Wound Assessment Pink;Red;Yellow;Brown 05/26/20 1133  % Wound base Red or Granulating 50% 05/26/20 1133  % Wound base Yellow/Fibrinous Exudate 30% 05/26/20 1133  % Wound base Black/Eschar 15% 05/26/20 1133  % Wound base Other/Granulation Tissue (Comment) 5% 05/26/20 1133  Peri-wound Assessment Intact 05/26/20 1133  Wound Length (cm) 9 cm 05/20/20 1325  Wound Width (cm) 8.5 cm 05/20/20 1325  Wound Depth (cm) 4.5 cm 05/20/20 1325  Wound Surface Area (cm^2) 76.5 cm^2 05/20/20 1325  Wound Volume (cm^3) 344.25 cm^3 05/20/20 1325  Tunneling (cm) 5 05/20/20 1325  Undermining (cm) (varying up to 4.5 cm 05/20/20 1325  Margins Unattached edges (unapproximated) 05/25/20 1100  Drainage Amount Minimal 05/26/20 1133  Drainage Description Serous;Sanguineous 05/26/20 1133  Treatment Cleansed;Debridement (Selective);Hydrotherapy (Pulse lavage);Packing (Saline gauze) 05/26/20 1133  Santyl applied to wound bed prior to applying dressing.  Hydrotherapy Pulsed lavage therapy - wound location: L ischial tuberosity Pulsed Lavage with  Suction (psi): 8 psi (to 12) Pulsed Lavage with Suction - Normal Saline Used: 1000 mL Pulsed Lavage Tip: Tip with splash shield Selective Debridement Selective Debridement - Location: L ischial tuberosity Selective Debridement - Tools Used: Forceps,Scalpel Selective Debridement - Tissue Removed: yellow and black unviable tissue    Wound Assessment and Plan  Wound Therapy - Assess/Plan/Recommendations Wound Therapy - Clinical Statement: Progressing with debridement. This patient will benefit from continued hydrotherapy for selective removal of unviable tissue, to decrease bioburden, and promote wound bed healing. Wound Therapy - Functional Problem List: Decreased strength, AROM, contractures, immobility due to MS Factors Delaying/Impairing Wound Healing: Immobility,Multiple medical problems,Incontinence Hydrotherapy Plan: Debridement,Dressing change,Pulsatile lavage with suction,Patient/family education Wound Therapy - Frequency: 6X / week Wound Therapy - Follow Up Recommendations: dressing changes by RN  Wound Therapy Goals- Improve the function of patient's integumentary system by progressing the wound(s) through the phases of wound healing (inflammation - proliferation - remodeling) by: Wound Therapy Goals - Improve the function of patient's integumentary system by progressing the wound(s) through the phases of wound healing by: Decrease Necrotic Tissue to: 20% Decrease Necrotic Tissue - Progress: Progressing toward goal Increase Granulation Tissue to: 80% Increase Granulation Tissue - Progress: Progressing toward goal Goals/treatment plan/discharge plan were made with and agreed upon by patient/family: Yes Time For Goal Achievement: 7 days Wound Therapy - Potential for Goals: Fair  Goals will be updated until maximal potential achieved or discharge criteria met.  Discharge criteria: when goals achieved, discharge from hospital, MD decision/surgical intervention, no progress towards  goals, refusal/missing three consecutive treatments without notification or medical reason.  GP     Charges PT Wound Care Charges $Wound Debridement up to 20 cm: < or equal to 20 cm $ Wound Debridement each add'l  20 sqcm: 2 $PT PLS Gun and Tip: 1 Supply $PT Hydrotherapy Visit: 1 Visit     05/26/2020  Ken M., PT Acute Rehabilitation Services [phone]  (pager) [phone]  (office)   V  05/26/2020, 11:35 AM   

## 2020-05-26 NOTE — Progress Notes (Signed)
   05/25/20 2025  Assess: MEWS Score  Temp 99.1 F (37.3 C)  BP 117/76  Pulse Rate (!) 107  Resp 17  Level of Consciousness Responds to Voice  SpO2 100 %  O2 Device Room Air  Assess: MEWS Score  MEWS Temp 0  MEWS Systolic 0  MEWS Pulse 1  MEWS RR 0  MEWS LOC 1  MEWS Score 2  MEWS Score Color Yellow  Assess: if the MEWS score is Yellow or Red  Were vital signs taken at a resting state? Yes  Focused Assessment No change from prior assessment  Early Detection of Sepsis Score *See Row Information* Low  MEWS guidelines implemented *See Row Information* No, previously yellow, continue vital signs every 4 hours  Treat  Pain Scale PAINAD  Breathing 0  Negative Vocalization 0  Facial Expression 0  Body Language 0  Consolability 0  PAINAD Score 0

## 2020-05-26 NOTE — Plan of Care (Signed)
  Problem: Nutrition: Goal: Adequate nutrition will be maintained Outcome: Progressing   

## 2020-05-26 NOTE — Progress Notes (Signed)
Terry Palmer  XIH:[id number] DOB: [birth year] DOA: 05/18/2020 PCP: [name], MD    Brief Narrative:  [phone] with a history of advanced MS with quadriplegia/contractures neurogenic bladder and nonverbal at baseline, chronic suprapubic catheter, chronic indwelling PEG tube, dementia, and seizure disorder who presented to the ER with fevers and worsening of a known decubitus ulcer.  In the ER he was found to be tachycardic in the 120s but was not hypotensive.  WBC was elevated.  A large sacral wound was confirmed and CT of the area noted progression of the soft tissue wound overlying the left ischial tuberosity with underlying bony erosion consistent with osteomyelitis.  He has been evaluated by general surgery who did not feel there was a role for a procedure.  ID is following and empiric antibiotics are being dosed.  Consultants:  General surgery ID  Code Status: FULL CODE  Antimicrobials:  Cefepime 5/11 > 5/12  vancomycin 5/11 > 5/13  Unasyn 5/12 > Daptomycin 5/13 >  DVT prophylaxis: SCDs  Subjective: Afebrile.  Tachycardia persists but is not excessive.  Sleeping soundly at time of my visit with no evidence of distress.  Assessment & Plan:  Sepsis POA due to large sacral wound with osteomyelitis Continue hydrotherapy -ID recommends 6 weeks of IV antibiotic therapy with Zosyn and daptomycin  Acute altered mental status Patient was much less interactive at time of admission -EEG negative for acute seizure activity -has returned to baseline awake arousable but nonverbal status  DM2 A1c 8.6 -this is a new formal diagnosis for the patient -CBG better controlled with adjustment in treatment yesterday  Bacteremia versus contamination 1 of 4 bottles positive for staph -most consistent with contamination  Hypokalemia Corrected with supplementation  Diarrhea C. difficile negative -GI pathogen panel negative  Advanced chronic MS with functional quadriplegia, dysphagia/PEG  tube dependence, neurogenic bladder with chronic suprapubic catheter  Seizure disorder Continue Keppra  Goals of care Palliative care has been consulted but the patient's wife insist that he would wish to remain full code    Family Communication:  Status is: Inpatient  Remains inpatient appropriate because:Inpatient level of care appropriate due to severity of illness   Dispo: The patient is from: Home              Anticipated d/c is to: unclear              Patient currently is not medically stable to d/c.   Difficult to place patient No     Objective: Blood pressure 105/72, pulse (!) 110, temperature 99.9 F (37.7 C), temperature source Oral, resp. rate 16, height 6\' 4"  (1.93 m), weight 83.9 kg, SpO2 98 %.  Intake/Output Summary (Last 24 hours) at 05/26/2020 1005 Last data filed at 05/26/2020 7915 Gross per 24 hour  Intake 0 ml  Output 2800 ml  Net -2800 ml   Filed Weights   05/18/20 2157 05/18/20 2246  Weight: 83.9 kg 83.9 kg    Examination: General: No acute respiratory distress Lungs: Clear to auscultation bilaterally  Cardiovascular: Regular rhythm without murmur  Abdomen: Nondistended, soft, bowel sounds positive Extremities: No signif edema bilateral lower extremities  CBC: Recent Labs  Lab 05/24/20 0119 05/25/20 0711 05/26/20 0320  WBC 13.7* 13.1* 14.9*  NEUTROABS 8.9* 8.2* 10.5*  HGB 10.6* 10.6* 10.6*  HCT 33.4* 32.6* 33.8*  MCV 93.0 90.3 91.6  PLT 457* 527* 056*   Basic Metabolic Panel: Recent Labs  Lab 05/20/20 0149 05/21/20 0125 05/22/20 0219 05/23/20 0534  05/25/20 0711  NA 134*   < > 137 137 136  K 3.3*   < > 3.2* 3.5 4.3  CL 104   < > 106 106 102  CO2 21*   < > 22 25 24   GLUCOSE 253*   < > 216* 137* 180*  BUN 6   < > 10 10 14   CREATININE 0.40*   < > <0.30* <0.30* 0.35*  CALCIUM 8.2*   < > 8.3* 8.3* 8.6*  MG 2.1  --   --   --   --    < > = values in this interval not displayed.   GFR: Estimated Creatinine Clearance: 136.9  mL/min (A) (by C-G formula based on SCr of 0.35 mg/dL (L)).  Liver Function Tests: No results for input(s): AST, ALT, ALKPHOS, BILITOT, PROT, ALBUMIN in the last 168 hours. No results for input(s): LIPASE, AMYLASE in the last 168 hours.  Coagulation Profile: No results for input(s): INR, PROTIME in the last 168 hours.  Cardiac Enzymes: Recent Labs  Lab 05/20/20 1529  CKTOTAL 66    HbA1C: Hgb A1c MFr Bld  Date/Time Value Ref Range Status  05/20/2020 08:27 AM 8.6 (H) 4.8 - 5.6 % Final    Comment:    (NOTE) Pre diabetes:          5.7%-6.4%  Diabetes:              >6.4%  Glycemic control for   <7.0% adults with diabetes     CBG: Recent Labs  Lab 05/25/20 1603 05/25/20 2020 05/26/20 0011 05/26/20 0412 05/26/20 0755  GLUCAP 184* 203* 173* 116* 130*    Recent Results (from the past 240 hour(s))  Blood Culture (routine x 2)     Status: Abnormal   Collection Time: 05/18/20  5:53 PM   Specimen: BLOOD  Result Value Ref Range Status   Specimen Description BLOOD SITE NOT SPECIFIED  Final   Special Requests   Final    BOTTLES DRAWN AEROBIC AND ANAEROBIC Blood Culture adequate volume   Culture  Setup Time   Final    GRAM POSITIVE COCCI IN CLUSTERS AEROBIC BOTTLE ONLY CRITICAL RESULT CALLED TO, READ BACK BY AND VERIFIED WITH: Salome Holmes PHARMD [id number] 05/19/20 A BROWNING    Culture (A)  Final    STAPHYLOCOCCUS HOMINIS THE SIGNIFICANCE OF ISOLATING THIS ORGANISM FROM A SINGLE SET OF BLOOD CULTURES WHEN MULTIPLE SETS ARE DRAWN IS UNCERTAIN. PLEASE NOTIFY THE MICROBIOLOGY DEPARTMENT WITHIN ONE WEEK IF SPECIATION AND SENSITIVITIES ARE REQUIRED. Performed at [facility], [place] [address] [place], [place] [address]    Report Status 05/20/2020 FINAL  Final  Blood Culture ID Panel (Reflexed)     Status: Abnormal   Collection Time: 05/18/20  5:53 PM  Result Value Ref Range Status   Enterococcus faecalis NOT DETECTED NOT DETECTED Final   Enterococcus Faecium NOT DETECTED NOT  DETECTED Final   Listeria monocytogenes NOT DETECTED NOT DETECTED Final   Staphylococcus species DETECTED (A) NOT DETECTED Final    Comment: CRITICAL RESULT CALLED TO, READ BACK BY AND VERIFIED WITH: Salome Holmes PHARMD [id number] 05/19/20 A BROWNING    Staphylococcus aureus (BCID) NOT DETECTED NOT DETECTED Final   Staphylococcus epidermidis NOT DETECTED NOT DETECTED Final   Staphylococcus lugdunensis NOT DETECTED NOT DETECTED Final   Streptococcus species NOT DETECTED NOT DETECTED Final   Streptococcus agalactiae NOT DETECTED NOT DETECTED Final   Streptococcus pneumoniae NOT DETECTED NOT DETECTED Final   Streptococcus pyogenes NOT  DETECTED NOT DETECTED Final   A.calcoaceticus-baumannii NOT DETECTED NOT DETECTED Final   Bacteroides fragilis NOT DETECTED NOT DETECTED Final   Enterobacterales NOT DETECTED NOT DETECTED Final   Enterobacter cloacae complex NOT DETECTED NOT DETECTED Final   Escherichia coli NOT DETECTED NOT DETECTED Final   Klebsiella aerogenes NOT DETECTED NOT DETECTED Final   Klebsiella oxytoca NOT DETECTED NOT DETECTED Final   Klebsiella pneumoniae NOT DETECTED NOT DETECTED Final   Proteus species NOT DETECTED NOT DETECTED Final   Salmonella species NOT DETECTED NOT DETECTED Final   Serratia marcescens NOT DETECTED NOT DETECTED Final   Haemophilus influenzae NOT DETECTED NOT DETECTED Final   Neisseria meningitidis NOT DETECTED NOT DETECTED Final   Pseudomonas aeruginosa NOT DETECTED NOT DETECTED Final   Stenotrophomonas maltophilia NOT DETECTED NOT DETECTED Final   Candida albicans NOT DETECTED NOT DETECTED Final   Candida auris NOT DETECTED NOT DETECTED Final   Candida glabrata NOT DETECTED NOT DETECTED Final   Candida krusei NOT DETECTED NOT DETECTED Final   Candida parapsilosis NOT DETECTED NOT DETECTED Final   Candida tropicalis NOT DETECTED NOT DETECTED Final   Cryptococcus neoformans/gattii NOT DETECTED NOT DETECTED Final    Comment: Performed at [facility],  [address] [place], [place] [address]  Blood Culture (routine x 2)     Status: None   Collection Time: 05/18/20  6:24 PM   Specimen: BLOOD  Result Value Ref Range Status   Specimen Description BLOOD SITE NOT SPECIFIED  Final   Special Requests   Final    BOTTLES DRAWN AEROBIC AND ANAEROBIC Blood Culture adequate volume   Culture   Final    NO GROWTH 5 DAYS Performed at [facility], [address] [place], [place] [address]    Report Status 05/23/2020 FINAL  Final  Resp Panel by RT-PCR (Flu A&B, Covid) Nasopharyngeal Swab     Status: None   Collection Time: 05/18/20  8:06 PM   Specimen: Nasopharyngeal Swab; Nasopharyngeal(NP) swabs in vial transport medium  Result Value Ref Range Status   SARS Coronavirus 2 by RT PCR NEGATIVE NEGATIVE Final    Comment: (NOTE) SARS-CoV-2 target nucleic acids are NOT DETECTED.  The SARS-CoV-2 RNA is generally detectable in upper respiratory specimens during the acute phase of infection. The lowest concentration of SARS-CoV-2 viral copies this assay can detect is 138 copies/mL. A negative result does not preclude SARS-Cov-2 infection and should not be used as the sole basis for treatment or other patient management decisions. A negative result may occur with  improper specimen collection/handling, submission of specimen other than nasopharyngeal swab, presence of viral mutation(s) within the areas targeted by this assay, and inadequate number of viral copies(<138 copies/mL). A negative result must be combined with clinical observations, patient history, and epidemiological information. The expected result is Negative.  Fact Sheet for Patients:  EntrepreneurPulse.com.au  Fact Sheet for Healthcare Providers:  IncredibleEmployment.be  This test is no t yet approved or cleared by the [place] FDA and  has been authorized for detection and/or diagnosis of SARS-CoV-2 by FDA under an Emergency Use  Authorization (EUA). This EUA will remain  in effect (meaning this test can be used) for the duration of the COVID-19 declaration under Section 564(b)(1) of the Act, 21 U.S.C.section 360bbb-3(b)(1), unless the authorization is terminated  or revoked sooner.       Influenza A by PCR NEGATIVE NEGATIVE Final   Influenza B by PCR NEGATIVE NEGATIVE Final    Comment: (  NOTE) The Xpert Xpress SARS-CoV-2/FLU/RSV plus assay is intended as an aid in the diagnosis of influenza from Nasopharyngeal swab specimens and should not be used as a sole basis for treatment. Nasal washings and aspirates are unacceptable for Xpert Xpress SARS-CoV-2/FLU/RSV testing.  Fact Sheet for Patients: EntrepreneurPulse.com.au  Fact Sheet for Healthcare Providers: IncredibleEmployment.be  This test is not yet approved or cleared by the [place] FDA and has been authorized for detection and/or diagnosis of SARS-CoV-2 by FDA under an Emergency Use Authorization (EUA). This EUA will remain in effect (meaning this test can be used) for the duration of the COVID-19 declaration under Section 564(b)(1) of the Act, 21 U.S.C. section 360bbb-3(b)(1), unless the authorization is terminated or revoked.  Performed at [facility], [place] [address], [place], [place] [address]   C Difficile Quick Screen w PCR reflex     Status: None   Collection Time: 05/18/20 10:28 PM   Specimen: STOOL  Result Value Ref Range Status   C Diff antigen NEGATIVE NEGATIVE Final   C Diff toxin NEGATIVE NEGATIVE Final   C Diff interpretation No C. difficile detected.  Final    Comment: Performed at [facility], [place] [address] [place], [place] [address]  Gastrointestinal Panel by PCR , Stool     Status: None   Collection Time: 05/18/20 10:28 PM   Specimen: STOOL  Result Value Ref Range Status   Campylobacter species NOT DETECTED NOT DETECTED Final   Plesimonas shigelloides NOT DETECTED NOT  DETECTED Final   Salmonella species NOT DETECTED NOT DETECTED Final   Yersinia enterocolitica NOT DETECTED NOT DETECTED Final   Vibrio species NOT DETECTED NOT DETECTED Final   Vibrio cholerae NOT DETECTED NOT DETECTED Final   Enteroaggregative E coli (EAEC) NOT DETECTED NOT DETECTED Final   Enteropathogenic E coli (EPEC) NOT DETECTED NOT DETECTED Final   Enterotoxigenic E coli (ETEC) NOT DETECTED NOT DETECTED Final   Shiga like toxin producing E coli (STEC) NOT DETECTED NOT DETECTED Final   Shigella/Enteroinvasive E coli (EIEC) NOT DETECTED NOT DETECTED Final   Cryptosporidium NOT DETECTED NOT DETECTED Final   Cyclospora cayetanensis NOT DETECTED NOT DETECTED Final   Entamoeba histolytica NOT DETECTED NOT DETECTED Final   Giardia lamblia NOT DETECTED NOT DETECTED Final   Adenovirus F40/41 NOT DETECTED NOT DETECTED Final   Astrovirus NOT DETECTED NOT DETECTED Final   Norovirus GI/GII NOT DETECTED NOT DETECTED Final   Rotavirus A NOT DETECTED NOT DETECTED Final   Sapovirus (I, II, IV, and V) NOT DETECTED NOT DETECTED Final    Comment: Performed at [facility], [address], [place], [place] [address]     Scheduled Meds: . ALPRAZolam  0.25 mg Oral TID  . vitamin C  500 mg Per Tube Daily  . baclofen  20 mg Per Tube QID  . buPROPion  75 mg Per Tube TID  . Chlorhexidine Gluconate Cloth  6 each Topical Daily  . collagenase   Topical Daily  . dantrolene  50 mg Per Tube 4 times per day  . feeding supplement (JEVITY 1.5 CAL/FIBER)  1,000 mL Per Tube Q24H  . feeding supplement (PROSource TF)  90 mL Per Tube BID  . folic acid  1 mg Per Tube Daily  . free water  240 mL Per Tube Q4H  . insulin aspart  0-15 Units Subcutaneous Q4H  . insulin detemir  20 Units Subcutaneous Daily  . levETIRAcetam  1,000 mg Per Tube TID  .  multivitamin with minerals  1 tablet Per Tube Daily  . nutrition supplement (JUVEN)  1 packet Per Tube BID BM  . pantoprazole sodium  40 mg Per Tube Daily  .  polyvinyl alcohol  1 drop Both Eyes BID  . vitamin B-12  500 mcg Per Tube Daily   Continuous Infusions: . sodium chloride    . ampicillin-sulbactam (UNASYN) IV 3 g (05/26/20 0615)  . DAPTOmycin (CUBICIN)  IV 650 mg (05/25/20 2051)     LOS: 8 days   Cherene Altes, MD [facility] Office  [phone] Pager - Text Page per Amion  If 7PM-7AM, please contact night-coverage per Amion 05/26/2020, 10:05 AM

## 2020-05-26 NOTE — Progress Notes (Signed)
   05/26/20 1831  Vitals  Temp 99.4 F (37.4 C)  Temp Source Oral  BP 103/63  MAP (mmHg) 76  BP Location Right Arm  BP Method Automatic  Patient Position (if appropriate) Lying  Pulse Rate (!) 112  Pulse Rate Source Monitor  Resp 18  MEWS COLOR  MEWS Score Color Red  Oxygen Therapy  SpO2 96 %  O2 Device Nasal Cannula  PAINAD (Pain Assessment in Advanced Dementia)  Breathing 0  Negative Vocalization 0  Facial Expression 0  Body Language 0  Consolability 0  PAINAD Score 0  Glasgow Coma Scale  Eye Opening 3  Best Verbal Response (NON-intubated) 1  Modified Verbal Response (INTUBATED) 3  Best Motor Response 4  Glasgow Coma Scale Score 11  MEWS Score  MEWS Temp 0  MEWS Systolic 0  MEWS Pulse 2  MEWS RR 0  MEWS LOC 2  MEWS Score 4  Note  Observations pt resting no distress noted   Pt has been fluctuating between yellow and red M[place]s. MD aware. Will continue to be Q4 and monitor pt

## 2020-05-27 DIAGNOSIS — A419 Sepsis, unspecified organism: Secondary | ICD-10-CM | POA: Diagnosis not present

## 2020-05-27 LAB — CBC
HCT: 33.5 % — ABNORMAL LOW (ref 39.0–52.0)
Hemoglobin: 10.5 g/dL — ABNORMAL LOW (ref 13.0–17.0)
MCH: 29.1 pg (ref 26.0–34.0)
MCHC: 31.3 g/dL (ref 30.0–36.0)
MCV: 92.8 fL (ref 80.0–100.0)
Platelets: 562 10*3/uL — ABNORMAL HIGH (ref 150–400)
RBC: 3.61 MIL/uL — ABNORMAL LOW (ref 4.22–5.81)
RDW: 13.6 % (ref 11.5–15.5)
WBC: 14.9 10*3/uL — ABNORMAL HIGH (ref 4.0–10.5)
nRBC: 0.1 % (ref 0.0–0.2)

## 2020-05-27 LAB — COMPREHENSIVE METABOLIC PANEL
ALT: 23 U/L (ref 0–44)
AST: 25 U/L (ref 15–41)
Albumin: 2.6 g/dL — ABNORMAL LOW (ref 3.5–5.0)
Alkaline Phosphatase: 95 U/L (ref 38–126)
Anion gap: 9 (ref 5–15)
BUN: 17 mg/dL (ref 6–20)
CO2: 24 mmol/L (ref 22–32)
Calcium: 8.7 mg/dL — ABNORMAL LOW (ref 8.9–10.3)
Chloride: 103 mmol/L (ref 98–111)
Creatinine, Ser: 0.34 mg/dL — ABNORMAL LOW (ref 0.61–1.24)
GFR, Estimated: >60 mL/min (ref >60)
Glucose, Bld: 230 mg/dL — ABNORMAL HIGH (ref 70–99)
Potassium: 4.2 mmol/L (ref 3.5–5.1)
Sodium: 136 mmol/L (ref 135–145)
Total Bilirubin: 0.3 mg/dL (ref 0.3–1.2)
Total Protein: 7 g/dL (ref 6.5–8.1)

## 2020-05-27 LAB — GLUCOSE, CAPILLARY
Glucose-Capillary: 179 mg/dL — ABNORMAL HIGH (ref 70–99)
Glucose-Capillary: 179 mg/dL — ABNORMAL HIGH (ref 70–99)
Glucose-Capillary: 185 mg/dL — ABNORMAL HIGH (ref 70–99)
Glucose-Capillary: 186 mg/dL — ABNORMAL HIGH (ref 70–99)
Glucose-Capillary: 228 mg/dL — ABNORMAL HIGH (ref 70–99)
Glucose-Capillary: 233 mg/dL — ABNORMAL HIGH (ref 70–99)

## 2020-05-27 LAB — CK: Total CK: 69 U/L (ref 49–397)

## 2020-05-27 MED ORDER — ACETAMINOPHEN 650 MG RE SUPP: 650.0000 mg | Freq: Four times a day (QID) | RECTAL | Status: DC | PRN

## 2020-05-27 MED ORDER — ACETAMINOPHEN 325 MG PO TABS
650.0000 mg | ORAL_TABLET | Freq: Four times a day (QID) | ORAL | Status: DC | PRN
  Administered 2020-06-01: 650 mg
  Filled 2020-05-27: qty 2

## 2020-05-27 MED ORDER — ALPRAZOLAM 0.25 MG PO TABS
0.2500 mg | ORAL_TABLET | Freq: Three times a day (TID) | ORAL | Status: DC
  Administered 2020-05-27 – 2020-05-30 (×9): 0.25 mg
  Filled 2020-05-27 (×9): qty 1

## 2020-05-27 NOTE — Progress Notes (Signed)
Terry Palmer  [id number] DOB: 1975-11-01 DOA: 05/18/2020 PCP: [name], MD    Brief Narrative:  [phone] with a history of advanced MS with quadriplegia/contractures neurogenic bladder and nonverbal at baseline, chronic suprapubic catheter, chronic indwelling PEG tube, dementia, and seizure disorder who presented to the ER with fevers and worsening of a known decubitus ulcer.  In the ER he was found to be tachycardic in the 120s but was not hypotensive.  WBC was elevated.  A large sacral wound was confirmed and CT of the area noted progression of the soft tissue wound overlying the left ischial tuberosity with underlying bony erosion consistent with osteomyelitis.  He has been evaluated by general surgery who did not feel there was a role for a procedure.  ID is following and empiric antibiotics are being dosed.  Consultants:  General surgery ID  Code Status: FULL CODE  Antimicrobials:  Cefepime 5/11 > 5/12  vancomycin 5/11 > 5/13  Unasyn 5/12 > Daptomycin 5/13 >  DVT prophylaxis: SCDs  Subjective: Afebrile.  Heart rate slowing down some.  Blood pressure stable.  Saturations 100% on room air.  Assessment & Plan:  Sepsis POA due to large sacral wound with osteomyelitis Continue hydrotherapy -ID recommends 6 weeks of IV antibiotic therapy with Zosyn and daptomycin -will need access placed by IR early next week to facilitate this  Acute altered mental status Patient was much less interactive at time of admission -EEG negative for acute seizure activity -has returned to baseline awake arousable but nonverbal status  DM2 A1c 8.6 -this is a new formal diagnosis for the patient -CBG better controlled with adjustment in treatment -follow without change in treatment today  Bacteremia versus contamination 1 of 4 bottles positive for staph -most consistent with contamination  Hypokalemia Corrected with supplementation  Diarrhea C. difficile negative -GI pathogen panel  negative  Advanced chronic MS with functional quadriplegia, dysphagia/PEG tube dependence, neurogenic bladder with chronic suprapubic catheter  Seizure disorder Continue Keppra  Goals of care Palliative care has been consulted but the patient's wife insist that he would wish to remain full code    Family Communication:  Status is: Inpatient  Remains inpatient appropriate because:Inpatient level of care appropriate due to severity of illness   Dispo: The patient is from: Home              Anticipated d/c is to: unclear              Patient currently is not medically stable to d/c.   Difficult to place patient No     Objective: Blood pressure 127/75, pulse (!) 110, temperature 99.8 F (37.7 C), temperature source Oral, resp. rate 18, height 6\' 4"  (1.93 m), weight 83.9 kg, SpO2 100 %.  Intake/Output Summary (Last 24 hours) at 05/27/2020 0946 Last data filed at 05/27/2020 [id number] Gross per 24 hour  Intake 0 ml  Output 2150 ml  Net -2150 ml   Filed Weights   05/18/20 2157 05/18/20 2246  Weight: 83.9 kg 83.9 kg    Examination: General: No acute respiratory distress Lungs: Clear to auscultation B Cardiovascular: Regular rhythm without murmur  Abdomen: Nondistended, soft, bowel sounds positive Extremities: No signif edema B LE   CBC: Recent Labs  Lab 05/24/20 0119 05/25/20 0711 05/26/20 0320 05/27/20 0034  WBC 13.7* 13.1* 14.9* 14.9*  NEUTROABS 8.9* 8.2* 10.5*  --   HGB 10.6* 10.6* 10.6* 10.5*  HCT 33.4* 32.6* 33.8* 33.5*  MCV 93.0 90.3 91.6 92.8  PLT 457* 527* 554* [id number]*   Basic Metabolic Panel: Recent Labs  Lab 05/23/20 0534 05/25/20 0711 05/27/20 0034  NA 137 136 136  K 3.5 4.3 4.2  CL 106 102 103  CO2 25 24 24   GLUCOSE 137* 180* 230*  BUN 10 14 17   CREATININE <0.30* 0.35* 0.34*  CALCIUM 8.3* 8.6* 8.7*   GFR: Estimated Creatinine Clearance: 136.9 mL/min (A) (by C-G formula based on SCr of 0.34 mg/dL (L)).  Liver Function Tests: Recent Labs  Lab  05/27/20 0034  AST 25  ALT 23  ALKPHOS 95  BILITOT 0.3  PROT 7.0  ALBUMIN 2.6*    Cardiac Enzymes: Recent Labs  Lab 05/20/20 1529 05/27/20 0034  CKTOTAL 66 69    HbA1C: Hgb A1c MFr Bld  Date/Time Value Ref Range Status  05/20/2020 08:27 AM 8.6 (H) 4.8 - 5.6 % Final    Comment:    (NOTE) Pre diabetes:          5.7%-6.4%  Diabetes:              >6.4%  Glycemic control for   <7.0% adults with diabetes     CBG: Recent Labs  Lab 05/26/20 1654 05/26/20 2005 05/27/20 0020 05/27/20 0407 05/27/20 0744  GLUCAP 215* 190* 228* 179* 179*    Recent Results (from the past 240 hour(s))  Blood Culture (routine x 2)     Status: Abnormal   Collection Time: 05/18/20  5:53 PM   Specimen: BLOOD  Result Value Ref Range Status   Specimen Description BLOOD SITE NOT SPECIFIED  Final   Special Requests   Final    BOTTLES DRAWN AEROBIC AND ANAEROBIC Blood Culture adequate volume   Culture  Setup Time   Final    GRAM POSITIVE COCCI IN CLUSTERS AEROBIC BOTTLE ONLY CRITICAL RESULT CALLED TO, READ BACK BY AND VERIFIED WITH: Salome Holmes PHARMD [id number] 05/19/20 A BROWNING    Culture (A)  Final    STAPHYLOCOCCUS HOMINIS THE SIGNIFICANCE OF ISOLATING THIS ORGANISM FROM A SINGLE SET OF BLOOD CULTURES WHEN MULTIPLE SETS ARE DRAWN IS UNCERTAIN. PLEASE NOTIFY THE MICROBIOLOGY DEPARTMENT WITHIN ONE WEEK IF SPECIATION AND SENSITIVITIES ARE REQUIRED. Performed at [facility], [place] [address], [place], [place] [address]    Report Status 05/20/2020 FINAL  Final  Blood Culture ID Panel (Reflexed)     Status: Abnormal   Collection Time: 05/18/20  5:53 PM  Result Value Ref Range Status   Enterococcus faecalis NOT DETECTED NOT DETECTED Final   Enterococcus Faecium NOT DETECTED NOT DETECTED Final   Listeria monocytogenes NOT DETECTED NOT DETECTED Final   Staphylococcus species DETECTED (A) NOT DETECTED Final    Comment: CRITICAL RESULT CALLED TO, READ BACK BY AND VERIFIED WITH: Salome Holmes PHARMD  [id number] 05/19/20 A BROWNING    Staphylococcus aureus (BCID) NOT DETECTED NOT DETECTED Final   Staphylococcus epidermidis NOT DETECTED NOT DETECTED Final   Staphylococcus lugdunensis NOT DETECTED NOT DETECTED Final   Streptococcus species NOT DETECTED NOT DETECTED Final   Streptococcus agalactiae NOT DETECTED NOT DETECTED Final   Streptococcus pneumoniae NOT DETECTED NOT DETECTED Final   Streptococcus pyogenes NOT DETECTED NOT DETECTED Final   A.calcoaceticus-baumannii NOT DETECTED NOT DETECTED Final   Bacteroides fragilis NOT DETECTED NOT DETECTED Final   Enterobacterales NOT DETECTED NOT DETECTED Final   Enterobacter cloacae complex NOT DETECTED NOT DETECTED Final   Escherichia coli NOT DETECTED NOT DETECTED Final   Klebsiella aerogenes NOT DETECTED NOT DETECTED Final  Klebsiella oxytoca NOT DETECTED NOT DETECTED Final   Klebsiella pneumoniae NOT DETECTED NOT DETECTED Final   Proteus species NOT DETECTED NOT DETECTED Final   Salmonella species NOT DETECTED NOT DETECTED Final   Serratia marcescens NOT DETECTED NOT DETECTED Final   Haemophilus influenzae NOT DETECTED NOT DETECTED Final   Neisseria meningitidis NOT DETECTED NOT DETECTED Final   Pseudomonas aeruginosa NOT DETECTED NOT DETECTED Final   Stenotrophomonas maltophilia NOT DETECTED NOT DETECTED Final   Candida albicans NOT DETECTED NOT DETECTED Final   Candida auris NOT DETECTED NOT DETECTED Final   Candida glabrata NOT DETECTED NOT DETECTED Final   Candida krusei NOT DETECTED NOT DETECTED Final   Candida parapsilosis NOT DETECTED NOT DETECTED Final   Candida tropicalis NOT DETECTED NOT DETECTED Final   Cryptococcus neoformans/gattii NOT DETECTED NOT DETECTED Final    Comment: Performed at [facility], [place] [address], [place], [place] [address]  Blood Culture (routine x 2)     Status: None   Collection Time: 05/18/20  6:24 PM   Specimen: BLOOD  Result Value Ref Range Status   Specimen Description BLOOD SITE NOT  SPECIFIED  Final   Special Requests   Final    BOTTLES DRAWN AEROBIC AND ANAEROBIC Blood Culture adequate volume   Culture   Final    NO GROWTH 5 DAYS Performed at [facility], [address] [place], [place] [address]    Report Status 05/23/2020 FINAL  Final  Resp Panel by RT-PCR (Flu A&B, Covid) Nasopharyngeal Swab     Status: None   Collection Time: 05/18/20  8:06 PM   Specimen: Nasopharyngeal Swab; Nasopharyngeal(NP) swabs in vial transport medium  Result Value Ref Range Status   SARS Coronavirus 2 by RT PCR NEGATIVE NEGATIVE Final    Comment: (NOTE) SARS-CoV-2 target nucleic acids are NOT DETECTED.  The SARS-CoV-2 RNA is generally detectable in upper respiratory specimens during the acute phase of infection. The lowest concentration of SARS-CoV-2 viral copies this assay can detect is 138 copies/mL. A negative result does not preclude SARS-Cov-2 infection and should not be used as the sole basis for treatment or other patient management decisions. A negative result may occur with  improper specimen collection/handling, submission of specimen other than nasopharyngeal swab, presence of viral mutation(s) within the areas targeted by this assay, and inadequate number of viral copies(<138 copies/mL). A negative result must be combined with clinical observations, patient history, and epidemiological information. The expected result is Negative.  Fact Sheet for Patients:  EntrepreneurPulse.com.au  Fact Sheet for Healthcare Providers:  IncredibleEmployment.be  This test is no t yet approved or cleared by the [place] FDA and  has been authorized for detection and/or diagnosis of SARS-CoV-2 by FDA under an Emergency Use Authorization (EUA). This EUA will remain  in effect (meaning this test can be used) for the duration of the COVID-19 declaration under Section 564(b)(1) of the Act, 21 U.S.C.section 360bbb-3(b)(1), unless the  authorization is terminated  or revoked sooner.       Influenza A by PCR NEGATIVE NEGATIVE Final   Influenza B by PCR NEGATIVE NEGATIVE Final    Comment: (NOTE) The Xpert Xpress SARS-CoV-2/FLU/RSV plus assay is intended as an aid in the diagnosis of influenza from Nasopharyngeal swab specimens and should not be used as a sole basis for treatment. Nasal washings and aspirates are unacceptable for Xpert Xpress SARS-CoV-2/FLU/RSV testing.  Fact Sheet for Patients: EntrepreneurPulse.com.au  Fact Sheet for Healthcare Providers: IncredibleEmployment.be  This test  is not yet approved or cleared by the [place] and has been authorized for detection and/or diagnosis of SARS-CoV-2 by FDA under an Emergency Use Authorization (EUA). This EUA will remain in effect (meaning this test can be used) for the duration of the COVID-19 declaration under Section 564(b)(1) of the Act, 21 U.S.C. section 360bbb-3(b)(1), unless the authorization is terminated or revoked.  Performed at [facility], [place] [address], [place], [place] [address]   C Difficile Quick Screen w PCR reflex     Status: None   Collection Time: 05/18/20 10:28 PM   Specimen: STOOL  Result Value Ref Range Status   C Diff antigen NEGATIVE NEGATIVE Final   C Diff toxin NEGATIVE NEGATIVE Final   C Diff interpretation No C. difficile detected.  Final    Comment: Performed at [facility], [place] [address], [place], [place] [address]  Gastrointestinal Panel by PCR , Stool     Status: None   Collection Time: 05/18/20 10:28 PM   Specimen: STOOL  Result Value Ref Range Status   Campylobacter species NOT DETECTED NOT DETECTED Final   Plesimonas shigelloides NOT DETECTED NOT DETECTED Final   Salmonella species NOT DETECTED NOT DETECTED Final   Yersinia enterocolitica NOT DETECTED NOT DETECTED Final   Vibrio species NOT DETECTED NOT DETECTED Final   Vibrio cholerae NOT DETECTED NOT  DETECTED Final   Enteroaggregative E coli (EAEC) NOT DETECTED NOT DETECTED Final   Enteropathogenic E coli (EPEC) NOT DETECTED NOT DETECTED Final   Enterotoxigenic E coli (ETEC) NOT DETECTED NOT DETECTED Final   Shiga like toxin producing E coli (STEC) NOT DETECTED NOT DETECTED Final   Shigella/Enteroinvasive E coli (EIEC) NOT DETECTED NOT DETECTED Final   Cryptosporidium NOT DETECTED NOT DETECTED Final   Cyclospora cayetanensis NOT DETECTED NOT DETECTED Final   Entamoeba histolytica NOT DETECTED NOT DETECTED Final   Giardia lamblia NOT DETECTED NOT DETECTED Final   Adenovirus F40/41 NOT DETECTED NOT DETECTED Final   Astrovirus NOT DETECTED NOT DETECTED Final   Norovirus GI/GII NOT DETECTED NOT DETECTED Final   Rotavirus A NOT DETECTED NOT DETECTED Final   Sapovirus (I, II, IV, and V) NOT DETECTED NOT DETECTED Final    Comment: Performed at [facility], [address], [place], [place] [address]     Scheduled Meds: . ALPRAZolam  0.25 mg Oral TID  . vitamin C  500 mg Per Tube Daily  . baclofen  20 mg Per Tube QID  . buPROPion  75 mg Per Tube TID  . Chlorhexidine Gluconate Cloth  6 each Topical Daily  . collagenase   Topical Daily  . dantrolene  50 mg Per Tube 4 times per day  . feeding supplement (JEVITY 1.5 CAL/FIBER)  1,000 mL Per Tube Q24H  . feeding supplement (PROSource TF)  90 mL Per Tube BID  . folic acid  1 mg Per Tube Daily  . free water  240 mL Per Tube Q4H  . insulin aspart  0-15 Units Subcutaneous Q4H  . insulin detemir  20 Units Subcutaneous Daily  . levETIRAcetam  1,000 mg Per Tube TID  . multivitamin with minerals  1 tablet Per Tube Daily  . nutrition supplement (JUVEN)  1 packet Per Tube BID BM  . pantoprazole sodium  40 mg Per Tube Daily  . polyvinyl alcohol  1 drop Both Eyes BID  . vitamin B-12  500 mcg Per Tube Daily   Continuous Infusions: . sodium chloride    .  ampicillin-sulbactam (UNASYN) IV 3 g (05/27/20 0601)  . DAPTOmycin (CUBICIN)  IV 650  mg (05/26/20 2021)     LOS: 9 days   Cherene Altes, MD [facility] Office  [phone] Pager - Text Page per Amion  If 7PM-7AM, please contact night-coverage per Amion 05/27/2020, 9:46 AM

## 2020-05-27 NOTE — Progress Notes (Signed)
Physical Therapy Wound Treatment Patient Details  Name: Terry Palmer MRN: [id number] Date of Birth: 1975/02/23  Today's Date: 05/27/2020 Time: 1201-1246 Time Calculation (min): 45 min  Subjective  Subjective Assessment Subjective: None - pt obtunded throughout the entirety of session and nonverbal at baseline Patient and Family Stated Goals: None Date of Onset:  (Unknown) Prior Treatments:  (Dressing changes)  Pain Score: No indication of pain throughout session.   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 (Active)  Wound Image   05/27/20 1435  Dressing Type ABD;Barrier Film (skin prep);Gauze (Comment);Moist to moist;Santyl 05/27/20 1435  Dressing Changed;Clean;Dry;Intact 05/27/20 1435  Dressing Change Frequency Daily 05/27/20 1435  State of Healing Early/partial granulation 05/27/20 1435  Site / Wound Assessment Black;Yellow;Pink;Red 05/27/20 1435  % Wound base Red or Granulating 60% 05/27/20 1435  % Wound base Yellow/Fibrinous Exudate 25% 05/27/20 1435  % Wound base Black/Eschar 10% 05/27/20 1435  % Wound base Other/Granulation Tissue (Comment) 5% 05/27/20 1435  Peri-wound Assessment Intact 05/27/20 1435  Wound Length (cm) 13 cm 05/27/20 1200  Wound Width (cm) 11.5 cm 05/27/20 1200  Wound Depth (cm) 4.3 cm 05/27/20 1200  Wound Surface Area (cm^2) 149.5 cm^2 05/27/20 1200  Wound Volume (cm^3) 642.85 cm^3 05/27/20 1200  Tunneling (cm) 5 05/20/20 1325  Undermining (cm) 6.1 at 12:00 05/27/20 1200  Margins Unattached edges (unapproximated) 05/27/20 1435  Drainage Amount Minimal 05/27/20 1435  Drainage Description Serosanguineous 05/27/20 1435  Treatment Debridement (Selective);Hydrotherapy (Pulse lavage);Packing (Saline gauze) 05/27/20 1435   Hydrotherapy Pulsed lavage therapy - wound location: L ischial tuberosity Pulsed Lavage with Suction (psi): 8 psi (to 12) Pulsed  Lavage with Suction - Normal Saline Used: 1000 mL Pulsed Lavage Tip: Tip with splash shield Selective Debridement Selective Debridement - Location: L ischial tuberosity Selective Debridement - Tools Used: Forceps,Scalpel Selective Debridement - Tissue Removed: yellow and black unviable tissue    Wound Assessment and Plan  Wound Therapy - Assess/Plan/Recommendations Wound Therapy - Clinical Statement: Progressing with debridement. This patient will benefit from continued hydrotherapy for selective removal of unviable tissue, to decrease bioburden, and promote wound bed healing. Wound Therapy - Functional Problem List: Decreased strength, AROM, contractures, immobility due to MS Factors Delaying/Impairing Wound Healing: Immobility,Multiple medical problems,Incontinence Hydrotherapy Plan: Debridement,Dressing change,Pulsatile lavage with suction,Patient/family education Wound Therapy - Frequency: 6X / week Wound Therapy - Follow Up Recommendations: dressing changes by RN  Wound Therapy Goals- Improve the function of patient's integumentary system by progressing the wound(s) through the phases of wound healing (inflammation - proliferation - remodeling) by: Wound Therapy Goals - Improve the function of patient's integumentary system by progressing the wound(s) through the phases of wound healing by: Decrease Necrotic Tissue to: 20% Decrease Necrotic Tissue - Progress: Progressing toward goal Increase Granulation Tissue to: 80% Increase Granulation Tissue - Progress: Progressing toward goal Goals/treatment plan/discharge plan were made with and agreed upon by patient/family: Yes Time For Goal Achievement: 7 days Wound Therapy - Potential for Goals: Fair  Goals will be updated until maximal potential achieved or discharge criteria met.  Discharge criteria: when goals achieved, discharge from hospital, MD decision/surgical intervention, no progress towards goals, refusal/missing three  consecutive treatments without notification or medical reason.  GP     Charges PT Wound Care Charges $Wound Debridement up to 20 cm: < or equal to 20 cm $ Wound Debridement each add'l 20 sqcm: 2 $PT PLS Gun and Tip: 1 Supply $PT  Hydrotherapy Visit: 1 Visit       Thelma Comp 05/27/2020, 2:39 PM  Rolinda Roan, PT, DPT Acute Rehabilitation Services Pager: [phone] Office: [phone]

## 2020-05-27 NOTE — Plan of Care (Signed)

## 2020-05-28 DIAGNOSIS — G35 Multiple sclerosis: Secondary | ICD-10-CM | POA: Diagnosis not present

## 2020-05-28 DIAGNOSIS — M869 Osteomyelitis, unspecified: Secondary | ICD-10-CM | POA: Diagnosis not present

## 2020-05-28 LAB — GLUCOSE, CAPILLARY
Glucose-Capillary: 148 mg/dL — ABNORMAL HIGH (ref 70–99)
Glucose-Capillary: 166 mg/dL — ABNORMAL HIGH (ref 70–99)
Glucose-Capillary: 179 mg/dL — ABNORMAL HIGH (ref 70–99)
Glucose-Capillary: 198 mg/dL — ABNORMAL HIGH (ref 70–99)
Glucose-Capillary: 219 mg/dL — ABNORMAL HIGH (ref 70–99)
Glucose-Capillary: 268 mg/dL — ABNORMAL HIGH (ref 70–99)
Glucose-Capillary: 281 mg/dL — ABNORMAL HIGH (ref 70–99)

## 2020-05-28 MED ORDER — GERHARDT'S BUTT CREAM
TOPICAL_CREAM | Freq: Three times a day (TID) | CUTANEOUS | Status: DC
  Administered 2020-06-03: 1 via TOPICAL
  Filled 2020-05-28: qty 1

## 2020-05-28 MED ORDER — CLONIDINE ORAL SUSPENSION 10 MCG/ML
0.1000 mg | Freq: Three times a day (TID) | ORAL | Status: DC
  Filled 2020-05-28: qty 10

## 2020-05-28 MED ORDER — CLONIDINE HCL 0.1 MG PO TABS
0.1000 mg | ORAL_TABLET | Freq: Three times a day (TID) | ORAL | Status: DC
  Administered 2020-05-28 – 2020-06-05 (×24): 0.1 mg
  Filled 2020-05-28 (×25): qty 1

## 2020-05-28 NOTE — Progress Notes (Signed)
Physical Therapy Wound Treatment Patient Details  Name: Terry Palmer MRN: [id number] Date of Birth: 1975/11/14  Today's Date: 05/28/2020 Time: 2122-4825 Time Calculation (min): 30 min  Subjective  Subjective Assessment Subjective: None - pt obtunded throughout the entirety of session and nonverbal at baseline Patient and Family Stated Goals: None Date of Onset:  (Unknown) Prior Treatments:  (Dressing changes)  Pain Score:  Pt did not appear to be in any distress throughout session  Wound Assessment  Pressure Injury 05/20/20 Ischial tuberosity Left Stage 4 - Full thickness tissue loss with exposed bone, tendon or muscle. (Active)  Dressing Type ABD;Barrier Film (skin prep);Gauze (Comment);Moist to moist;Santyl 05/28/20 1321  Dressing Changed;Clean;Dry;Intact 05/28/20 1321  Dressing Change Frequency Daily 05/28/20 1321  State of Healing Early/partial granulation 05/28/20 1321  Site / Wound Assessment Red;Yellow;Black 05/28/20 1321  % Wound base Red or Granulating 65% 05/28/20 1321  % Wound base Yellow/Fibrinous Exudate 20% 05/28/20 1321  % Wound base Black/Eschar 10% 05/28/20 1321  % Wound base Other/Granulation Tissue (Comment) 5% 05/28/20 1321  Peri-wound Assessment Bleeding 05/28/20 1321  Wound Length (cm) 13 cm 05/27/20 1200  Wound Width (cm) 11.5 cm 05/27/20 1200  Wound Depth (cm) 4.3 cm 05/27/20 1200  Wound Surface Area (cm^2) 149.5 cm^2 05/27/20 1200  Wound Volume (cm^3) 642.85 cm^3 05/27/20 1200  Tunneling (cm) 5 05/20/20 1325  Undermining (cm) 6.1 at 12:00 05/27/20 1200  Margins Unattached edges (unapproximated) 05/28/20 1321  Drainage Amount Minimal 05/28/20 1321  Drainage Description Serosanguineous 05/28/20 1321  Treatment Debridement (Selective);Hydrotherapy (Pulse lavage);Packing (Saline gauze) 05/28/20 1321   Hydrotherapy Pulsed lavage therapy - wound location: L ischial tuberosity Pulsed Lavage with Suction (psi): 8 psi (to 12) Pulsed Lavage with Suction -  Normal Saline Used: 1000 mL Pulsed Lavage Tip: Tip with splash shield Selective Debridement Selective Debridement - Location: L ischial tuberosity Selective Debridement - Tools Used: Forceps,Scalpel Selective Debridement - Tissue Removed: yellow and black unviable tissue    Wound Assessment and Plan  Wound Therapy - Assess/Plan/Recommendations Wound Therapy - Clinical Statement: Progressing with debridement. This patient will benefit from continued hydrotherapy for selective removal of unviable tissue, to decrease bioburden, and promote wound bed healing. Wound Therapy - Functional Problem List: Decreased strength, AROM, contractures, immobility due to MS Factors Delaying/Impairing Wound Healing: Immobility,Multiple medical problems,Incontinence Hydrotherapy Plan: Debridement,Dressing change,Pulsatile lavage with suction,Patient/family education Wound Therapy - Frequency: 6X / week Wound Therapy - Follow Up Recommendations: dressing changes by RN  Wound Therapy Goals- Improve the function of patient's integumentary system by progressing the wound(s) through the phases of wound healing (inflammation - proliferation - remodeling) by: Wound Therapy Goals - Improve the function of patient's integumentary system by progressing the wound(s) through the phases of wound healing by: Decrease Necrotic Tissue to: 20% Decrease Necrotic Tissue - Progress: Progressing toward goal Increase Granulation Tissue to: 80% Increase Granulation Tissue - Progress: Progressing toward goal Goals/treatment plan/discharge plan were made with and agreed upon by patient/family: Yes Time For Goal Achievement: 7 days Wound Therapy - Potential for Goals: Fair  Goals will be updated until maximal potential achieved or discharge criteria met.  Discharge criteria: when goals achieved, discharge from hospital, MD decision/surgical intervention, no progress towards goals, refusal/missing three consecutive treatments without  notification or medical reason.  GP     Charges PT Wound Care Charges $Wound Debridement up to 20 cm: < or equal to 20 cm $ Wound Debridement each add'l 20 sqcm: 2 $PT PLS Gun and Tip: 1 Supply $PT Hydrotherapy Visit:  1 Visit       Thelma Comp 05/28/2020, 1:34 PM   Rolinda Roan, PT, DPT Acute Rehabilitation Services Pager: [phone] Office: [phone]

## 2020-05-28 NOTE — Progress Notes (Signed)
Terry Palmer  EGB:[id number] DOB: 01-15-1975 DOA: 05/18/2020 PCP: [name], MD    Brief Narrative:  [phone] with a history of advanced MS with quadriplegia/contractures neurogenic bladder and nonverbal at baseline, chronic suprapubic catheter, chronic indwelling PEG tube, dementia, and seizure disorder who presented to the ER with fevers and worsening of a known decubitus ulcer.  In the ER he was found to be tachycardic in the 120s but was not hypotensive.  WBC was elevated.  A large sacral wound was confirmed and CT of the area noted progression of the soft tissue wound overlying the left ischial tuberosity with underlying bony erosion consistent with osteomyelitis.  He has been evaluated by general surgery who did not feel there was a role for a procedure.  ID is following and empiric antibiotics are being dosed.  Consultants:  General surgery ID  Code Status: FULL CODE  Antimicrobials:  Cefepime 5/11 > 5/12  vancomycin 5/11 > 5/13  Unasyn 5/12 > Daptomycin 5/13 >  DVT prophylaxis: SCDs  Subjective: Mild tachycardia persists.  Afebrile.  Vital signs otherwise stable.  Appears to be resting comfortably in bed.  Assessment & Plan:  Sepsis POA due to large sacral wound with osteomyelitis Continue hydrotherapy -ID recommends 6 weeks of IV antibiotic therapy with Zosyn and daptomycin -will need access placed by IR early next week to facilitate this  Acute altered mental status Patient was much less interactive at time of admission -EEG negative for acute seizure activity -has returned to baseline awake arousable but nonverbal status  DM2 A1c 8.6 -this is a new formal diagnosis for the patient -CBG better controlled with adjustment in treatment -follow without change in treatment today   Bacteremia versus contamination 1 of 4 bottles positive for staph -most consistent with contamination  Hypokalemia Corrected with supplementation  Diarrhea C. difficile negative -GI  pathogen panel negative  Advanced chronic MS with functional quadriplegia, dysphagia/PEG tube dependence, neurogenic bladder with chronic suprapubic catheter  Seizure disorder Continue Keppra  Goals of care Palliative care has been consulted but the patient's wife insist that he would wish to remain full code    Family Communication:  Status is: Inpatient  Remains inpatient appropriate because:Inpatient level of care appropriate due to severity of illness   Dispo: The patient is from: Home              Anticipated d/c is to: unclear              Patient currently is not medically stable to d/c.   Difficult to place patient No     Objective: Blood pressure 111/80, pulse (!) 110, temperature 98.9 F (37.2 C), temperature source Axillary, resp. rate 17, height 6\' 4"  (1.93 m), weight 83.9 kg, SpO2 100 %.  Intake/Output Summary (Last 24 hours) at 05/28/2020 0947 Last data filed at 05/28/2020 0659 Gross per 24 hour  Intake 2169.6 ml  Output 2325 ml  Net -155.4 ml   Filed Weights   05/18/20 2157 05/18/20 2246  Weight: 83.9 kg 83.9 kg    Examination: General: No acute respiratory distress Lungs: Clear to auscultation B Cardiovascular: Regular rhythm  Abdomen: Nondistended, soft, bowel sounds positive Extremities: No signif edema B LE   CBC: Recent Labs  Lab 05/24/20 0119 05/25/20 0711 05/26/20 0320 05/27/20 0034  WBC 13.7* 13.1* 14.9* 14.9*  NEUTROABS 8.9* 8.2* 10.5*  --   HGB 10.6* 10.6* 10.6* 10.5*  HCT 33.4* 32.6* 33.8* 33.5*  MCV 93.0 90.3 91.6 92.8  PLT 457* 527* 554* [id number]*   Basic Metabolic Panel: Recent Labs  Lab 05/23/20 0534 05/25/20 0711 05/27/20 0034  NA 137 136 136  K 3.5 4.3 4.2  CL 106 102 103  CO2 25 24 24   GLUCOSE 137* 180* 230*  BUN 10 14 17   CREATININE <0.30* 0.35* 0.34*  CALCIUM 8.3* 8.6* 8.7*   GFR: Estimated Creatinine Clearance: 136.9 mL/min (A) (by C-G formula based on SCr of 0.34 mg/dL (L)).  Liver Function Tests: Recent  Labs  Lab 05/27/20 0034  AST 25  ALT 23  ALKPHOS 95  BILITOT 0.3  PROT 7.0  ALBUMIN 2.6*    Cardiac Enzymes: Recent Labs  Lab 05/27/20 0034  CKTOTAL 69    HbA1C: Hgb A1c MFr Bld  Date/Time Value Ref Range Status  05/20/2020 08:27 AM 8.6 (H) 4.8 - 5.6 % Final    Comment:    (NOTE) Pre diabetes:          5.7%-6.4%  Diabetes:              >6.4%  Glycemic control for   <7.0% adults with diabetes     CBG: Recent Labs  Lab 05/27/20 1551 05/27/20 2011 05/27/20 2347 05/28/20 0352 05/28/20 0824  GLUCAP 186* 233* 219* 179* 148*    Recent Results (from the past 240 hour(s))  Blood Culture (routine x 2)     Status: Abnormal   Collection Time: 05/18/20  5:53 PM   Specimen: BLOOD  Result Value Ref Range Status   Specimen Description BLOOD SITE NOT SPECIFIED  Final   Special Requests   Final    BOTTLES DRAWN AEROBIC AND ANAEROBIC Blood Culture adequate volume   Culture  Setup Time   Final    GRAM POSITIVE COCCI IN CLUSTERS AEROBIC BOTTLE ONLY CRITICAL RESULT CALLED TO, READ BACK BY AND VERIFIED WITH: Salome Holmes PHARMD [id number] 05/19/20 A BROWNING    Culture (A)  Final    STAPHYLOCOCCUS HOMINIS THE SIGNIFICANCE OF ISOLATING THIS ORGANISM FROM A SINGLE SET OF BLOOD CULTURES WHEN MULTIPLE SETS ARE DRAWN IS UNCERTAIN. PLEASE NOTIFY THE MICROBIOLOGY DEPARTMENT WITHIN ONE WEEK IF SPECIATION AND SENSITIVITIES ARE REQUIRED. Performed at [facility], [place] [address] [place], [place] [address]    Report Status 05/20/2020 FINAL  Final  Blood Culture ID Panel (Reflexed)     Status: Abnormal   Collection Time: 05/18/20  5:53 PM  Result Value Ref Range Status   Enterococcus faecalis NOT DETECTED NOT DETECTED Final   Enterococcus Faecium NOT DETECTED NOT DETECTED Final   Listeria monocytogenes NOT DETECTED NOT DETECTED Final   Staphylococcus species DETECTED (A) NOT DETECTED Final    Comment: CRITICAL RESULT CALLED TO, READ BACK BY AND VERIFIED WITH: Salome Holmes PHARMD [id number]  05/19/20 A BROWNING    Staphylococcus aureus (BCID) NOT DETECTED NOT DETECTED Final   Staphylococcus epidermidis NOT DETECTED NOT DETECTED Final   Staphylococcus lugdunensis NOT DETECTED NOT DETECTED Final   Streptococcus species NOT DETECTED NOT DETECTED Final   Streptococcus agalactiae NOT DETECTED NOT DETECTED Final   Streptococcus pneumoniae NOT DETECTED NOT DETECTED Final   Streptococcus pyogenes NOT DETECTED NOT DETECTED Final   A.calcoaceticus-baumannii NOT DETECTED NOT DETECTED Final   Bacteroides fragilis NOT DETECTED NOT DETECTED Final   Enterobacterales NOT DETECTED NOT DETECTED Final   Enterobacter cloacae complex NOT DETECTED NOT DETECTED Final   Escherichia coli NOT DETECTED NOT DETECTED Final   Klebsiella aerogenes NOT DETECTED NOT DETECTED Final   Klebsiella oxytoca NOT  DETECTED NOT DETECTED Final   Klebsiella pneumoniae NOT DETECTED NOT DETECTED Final   Proteus species NOT DETECTED NOT DETECTED Final   Salmonella species NOT DETECTED NOT DETECTED Final   Serratia marcescens NOT DETECTED NOT DETECTED Final   Haemophilus influenzae NOT DETECTED NOT DETECTED Final   Neisseria meningitidis NOT DETECTED NOT DETECTED Final   Pseudomonas aeruginosa NOT DETECTED NOT DETECTED Final   Stenotrophomonas maltophilia NOT DETECTED NOT DETECTED Final   Candida albicans NOT DETECTED NOT DETECTED Final   Candida auris NOT DETECTED NOT DETECTED Final   Candida glabrata NOT DETECTED NOT DETECTED Final   Candida krusei NOT DETECTED NOT DETECTED Final   Candida parapsilosis NOT DETECTED NOT DETECTED Final   Candida tropicalis NOT DETECTED NOT DETECTED Final   Cryptococcus neoformans/gattii NOT DETECTED NOT DETECTED Final    Comment: Performed at [facility], [place] [address], [place], [place] [address]  Blood Culture (routine x 2)     Status: None   Collection Time: 05/18/20  6:24 PM   Specimen: BLOOD  Result Value Ref Range Status   Specimen Description BLOOD SITE NOT SPECIFIED   Final   Special Requests   Final    BOTTLES DRAWN AEROBIC AND ANAEROBIC Blood Culture adequate volume   Culture   Final    NO GROWTH 5 DAYS Performed at [facility], [address], [place], [place] [address]    Report Status 05/23/2020 FINAL  Final  Resp Panel by RT-PCR (Flu A&B, Covid) Nasopharyngeal Swab     Status: None   Collection Time: 05/18/20  8:06 PM   Specimen: Nasopharyngeal Swab; Nasopharyngeal(NP) swabs in vial transport medium  Result Value Ref Range Status   SARS Coronavirus 2 by RT PCR NEGATIVE NEGATIVE Final    Comment: (NOTE) SARS-CoV-2 target nucleic acids are NOT DETECTED.  The SARS-CoV-2 RNA is generally detectable in upper respiratory specimens during the acute phase of infection. The lowest concentration of SARS-CoV-2 viral copies this assay can detect is 138 copies/mL. A negative result does not preclude SARS-Cov-2 infection and should not be used as the sole basis for treatment or other patient management decisions. A negative result may occur with  improper specimen collection/handling, submission of specimen other than nasopharyngeal swab, presence of viral mutation(s) within the areas targeted by this assay, and inadequate number of viral copies(<138 copies/mL). A negative result must be combined with clinical observations, patient history, and epidemiological information. The expected result is Negative.  Fact Sheet for Patients:  EntrepreneurPulse.com.au  Fact Sheet for Healthcare Providers:  IncredibleEmployment.be  This test is no t yet approved or cleared by the [place] FDA and  has been authorized for detection and/or diagnosis of SARS-CoV-2 by FDA under an Emergency Use Authorization (EUA). This EUA will remain  in effect (meaning this test can be used) for the duration of the COVID-19 declaration under Section 564(b)(1) of the Act, 21 U.S.C.section 360bbb-3(b)(1), unless the authorization is  terminated  or revoked sooner.       Influenza A by PCR NEGATIVE NEGATIVE Final   Influenza B by PCR NEGATIVE NEGATIVE Final    Comment: (NOTE) The Xpert Xpress SARS-CoV-2/FLU/RSV plus assay is intended as an aid in the diagnosis of influenza from Nasopharyngeal swab specimens and should not be used as a sole basis for treatment. Nasal washings and aspirates are unacceptable for Xpert Xpress SARS-CoV-2/FLU/RSV testing.  Fact Sheet for Patients: EntrepreneurPulse.com.au  Fact Sheet for Healthcare Providers: IncredibleEmployment.be  This test is not yet  approved or cleared by the [place] and has been authorized for detection and/or diagnosis of SARS-CoV-2 by FDA under an Emergency Use Authorization (EUA). This EUA will remain in effect (meaning this test can be used) for the duration of the COVID-19 declaration under Section 564(b)(1) of the Act, 21 U.S.C. section 360bbb-3(b)(1), unless the authorization is terminated or revoked.  Performed at [facility], [place] [address], [place], [place] [address]   C Difficile Quick Screen w PCR reflex     Status: None   Collection Time: 05/18/20 10:28 PM   Specimen: STOOL  Result Value Ref Range Status   C Diff antigen NEGATIVE NEGATIVE Final   C Diff toxin NEGATIVE NEGATIVE Final   C Diff interpretation No C. difficile detected.  Final    Comment: Performed at [facility], [place] [address], [place], [place] [address]  Gastrointestinal Panel by PCR , Stool     Status: None   Collection Time: 05/18/20 10:28 PM   Specimen: STOOL  Result Value Ref Range Status   Campylobacter species NOT DETECTED NOT DETECTED Final   Plesimonas shigelloides NOT DETECTED NOT DETECTED Final   Salmonella species NOT DETECTED NOT DETECTED Final   Yersinia enterocolitica NOT DETECTED NOT DETECTED Final   Vibrio species NOT DETECTED NOT DETECTED Final   Vibrio cholerae NOT DETECTED NOT DETECTED Final    Enteroaggregative E coli (EAEC) NOT DETECTED NOT DETECTED Final   Enteropathogenic E coli (EPEC) NOT DETECTED NOT DETECTED Final   Enterotoxigenic E coli (ETEC) NOT DETECTED NOT DETECTED Final   Shiga like toxin producing E coli (STEC) NOT DETECTED NOT DETECTED Final   Shigella/Enteroinvasive E coli (EIEC) NOT DETECTED NOT DETECTED Final   Cryptosporidium NOT DETECTED NOT DETECTED Final   Cyclospora cayetanensis NOT DETECTED NOT DETECTED Final   Entamoeba histolytica NOT DETECTED NOT DETECTED Final   Giardia lamblia NOT DETECTED NOT DETECTED Final   Adenovirus F40/41 NOT DETECTED NOT DETECTED Final   Astrovirus NOT DETECTED NOT DETECTED Final   Norovirus GI/GII NOT DETECTED NOT DETECTED Final   Rotavirus A NOT DETECTED NOT DETECTED Final   Sapovirus (I, II, IV, and V) NOT DETECTED NOT DETECTED Final    Comment: Performed at [facility], [address] [place], [place] [address]     Scheduled Meds: . ALPRAZolam  0.25 mg Per Tube TID  . vitamin C  500 mg Per Tube Daily  . baclofen  20 mg Per Tube QID  . buPROPion  75 mg Per Tube TID  . Chlorhexidine Gluconate Cloth  6 each Topical Daily  . collagenase   Topical Daily  . dantrolene  50 mg Per Tube 4 times per day  . feeding supplement (JEVITY 1.5 CAL/FIBER)  1,000 mL Per Tube Q24H  . feeding supplement (PROSource TF)  90 mL Per Tube BID  . folic acid  1 mg Per Tube Daily  . free water  240 mL Per Tube Q4H  . insulin aspart  0-15 Units Subcutaneous Q4H  . insulin detemir  20 Units Subcutaneous Daily  . levETIRAcetam  1,000 mg Per Tube TID  . multivitamin with minerals  1 tablet Per Tube Daily  . nutrition supplement (JUVEN)  1 packet Per Tube BID BM  . pantoprazole sodium  40 mg Per Tube Daily  . polyvinyl alcohol  1 drop Both Eyes BID  . vitamin B-12  500 mcg Per Tube Daily   Continuous Infusions: . sodium chloride    .  ampicillin-sulbactam (UNASYN) IV 200 mL/hr at 05/28/20 0659  . DAPTOmycin (CUBICIN)  IV Stopped  (05/27/20 2035)     LOS: 10 days   Cherene Altes, MD [facility] Office  [phone] Pager - Text Page per Amion  If 7PM-7AM, please contact night-coverage per Amion 05/28/2020, 9:47 AM

## 2020-05-29 DIAGNOSIS — M869 Osteomyelitis, unspecified: Secondary | ICD-10-CM | POA: Diagnosis not present

## 2020-05-29 DIAGNOSIS — G35 Multiple sclerosis: Secondary | ICD-10-CM | POA: Diagnosis not present

## 2020-05-29 LAB — GLUCOSE, CAPILLARY
Glucose-Capillary: 100 mg/dL — ABNORMAL HIGH (ref 70–99)
Glucose-Capillary: 132 mg/dL — ABNORMAL HIGH (ref 70–99)
Glucose-Capillary: 167 mg/dL — ABNORMAL HIGH (ref 70–99)
Glucose-Capillary: 178 mg/dL — ABNORMAL HIGH (ref 70–99)
Glucose-Capillary: 209 mg/dL — ABNORMAL HIGH (ref 70–99)
Glucose-Capillary: 212 mg/dL — ABNORMAL HIGH (ref 70–99)
Glucose-Capillary: 247 mg/dL — ABNORMAL HIGH (ref 70–99)

## 2020-05-29 NOTE — Progress Notes (Signed)
   05/29/20 1930  Vitals  Temp 98.9 F (37.2 C)  Temp Source Oral  BP 90/64  MAP (mmHg) 74  BP Location Right Arm  BP Method Automatic  Patient Position (if appropriate) Lying  Pulse Rate (!) 101  Pulse Rate Source Monitor  Resp 18  MEWS COLOR  MEWS Score Color Yellow  Oxygen Therapy  SpO2 99 %  MEWS Score  MEWS Temp 0  MEWS Systolic 1  MEWS Pulse 1  MEWS RR 0  MEWS LOC 1  MEWS Score 3  Non verbal, Painad 0, Pt consistently remained in yellow mews during the day, Bid dressing changes performed. MD aware of yellow Mews, continue monitoring pt on Q4 vitals

## 2020-05-29 NOTE — Progress Notes (Signed)
Terry Palmer  ZOX:[id number] DOB: 08/25/1975 DOA: 05/18/2020 PCP: [name], MD    Brief Narrative:  [phone] with a history of advanced MS with quadriplegia/contractures neurogenic bladder and nonverbal at baseline, chronic suprapubic catheter, chronic indwelling PEG tube, dementia, and seizure disorder who presented to the ER with fevers and worsening of a known decubitus ulcer. In the ER he was found to be tachycardic in the 120s but was not hypotensive. WBC was elevated. A large sacral wound was confirmed and CT of the area noted progression of the soft tissue wound overlying the left ischial tuberosity with underlying bony erosion consistent with osteomyelitis. He has been evaluated by general surgery who did not feel there was a role for a procedure.  ID is following and empiric antibiotics are being dosed.  Consultants:  General surgery ID  Code Status: FULL CODE  Antimicrobials:  Cefepime 5/11 > 5/12  vancomycin 5/11 > 5/13  Unasyn 5/12 > Daptomycin 5/13 >  DVT prophylaxis: SCDs  Subjective: Tachycardia has improved with addition of clonidine.  Vital signs otherwise stable.  Appears to be resting comfortably at the time of my visit.  Assessment & Plan:  Sepsis POA due to large sacral wound with osteomyelitis Continue hydrotherapy - ID recommends 6 weeks of IV antibiotic therapy with Unasyn and daptomycin -will need access placed by IR to facilitate this - HHRN arrangements in place for IV abx and required labs to monitor   Acute altered mental status Patient was much less interactive at time of admission -EEG negative for acute seizure activity -has returned to baseline intermittent awake arousable but nonverbal status  DM2 A1c 8.6 -this is a new formal diagnosis for the patient -CBG better controlled with adjustment in treatment   Bacteremia versus contamination 1 of 4 bottles positive for staph - most consistent with contamination  Hypokalemia Corrected with  supplementation  Diarrhea C. difficile negative -GI pathogen panel negative  Advanced chronic MS with functional quadriplegia, dysphagia/PEG tube dependence, neurogenic bladder with chronic suprapubic catheter  Seizure disorder Continue Keppra  Goals of care Palliative care has been consulted but the patient's wife insist that he would wish to remain full code - medically it appears ongoing aggressive care of this patient would be futile     Family Communication: No family present at time of exam Status is: Inpatient  Remains inpatient appropriate because:Inpatient level of care appropriate due to severity of illness   Dispo: The patient is from: Home              Anticipated d/c is to: unclear              Patient currently is not medically stable to d/c.   Difficult to place patient No     Objective: Blood pressure 99/69, pulse (!) 101, temperature 98.5 F (36.9 C), temperature source Oral, resp. rate 18, height 6\' 4"  (1.93 m), weight 83.9 kg, SpO2 100 %.  Intake/Output Summary (Last 24 hours) at 05/29/2020 0929 Last data filed at 05/29/2020 0740 Gross per 24 hour  Intake 482.39 ml  Output 1875 ml  Net -1392.61 ml   Filed Weights   05/18/20 2157 05/18/20 2246  Weight: 83.9 kg 83.9 kg    Examination: General: No acute respiratory distress Lungs: Clear to auscultation B Cardiovascular: Regular rhythm without murmur Abdomen: Nondistended, soft, bowel sounds positive Extremities: No signif edema B LE   CBC: Recent Labs  Lab 05/24/20 0119 05/25/20 0711 05/26/20 0320 05/27/20 0034  WBC  13.7* 13.1* 14.9* 14.9*  NEUTROABS 8.9* 8.2* 10.5*  --   HGB 10.6* 10.6* 10.6* 10.5*  HCT 33.4* 32.6* 33.8* 33.5*  MCV 93.0 90.3 91.6 92.8  PLT 457* 527* 554* 354*   Basic Metabolic Panel: Recent Labs  Lab 05/23/20 0534 05/25/20 0711 05/27/20 0034  NA 137 136 136  K 3.5 4.3 4.2  CL 106 102 103  CO2 25 24 24   GLUCOSE 137* 180* 230*  BUN 10 14 17   CREATININE <0.30*  0.35* 0.34*  CALCIUM 8.3* 8.6* 8.7*   GFR: Estimated Creatinine Clearance: 136.9 mL/min (A) (by C-G formula based on SCr of 0.34 mg/dL (L)).  Liver Function Tests: Recent Labs  Lab 05/27/20 0034  AST 25  ALT 23  ALKPHOS 95  BILITOT 0.3  PROT 7.0  ALBUMIN 2.6*    Cardiac Enzymes: Recent Labs  Lab 05/27/20 0034  CKTOTAL 69    HbA1C: Hgb A1c MFr Bld  Date/Time Value Ref Range Status  05/20/2020 08:27 AM 8.6 (H) 4.8 - 5.6 % Final    Comment:    (NOTE) Pre diabetes:          5.7%-6.4%  Diabetes:              >6.4%  Glycemic control for   <7.0% adults with diabetes     CBG: Recent Labs  Lab 05/28/20 1543 05/28/20 2007 05/28/20 2347 05/29/20 0349 05/29/20 0753  GLUCAP 198* 268* 281* 178* 100*     Scheduled Meds: . ALPRAZolam  0.25 mg Per Tube TID  . vitamin C  500 mg Per Tube Daily  . baclofen  20 mg Per Tube QID  . buPROPion  75 mg Per Tube TID  . Chlorhexidine Gluconate Cloth  6 each Topical Daily  . cloNIDine  0.1 mg Per Tube TID  . collagenase   Topical Daily  . dantrolene  50 mg Per Tube 4 times per day  . feeding supplement (JEVITY 1.5 CAL/FIBER)  1,000 mL Per Tube Q24H  . feeding supplement (PROSource TF)  90 mL Per Tube BID  . folic acid  1 mg Per Tube Daily  . free water  240 mL Per Tube Q4H  . Gerhardt's butt cream   Topical TID  . insulin aspart  0-15 Units Subcutaneous Q4H  . insulin detemir  20 Units Subcutaneous Daily  . levETIRAcetam  1,000 mg Per Tube TID  . multivitamin with minerals  1 tablet Per Tube Daily  . nutrition supplement (JUVEN)  1 packet Per Tube BID BM  . pantoprazole sodium  40 mg Per Tube Daily  . polyvinyl alcohol  1 drop Both Eyes BID  . vitamin B-12  500 mcg Per Tube Daily   Continuous Infusions: . sodium chloride    . ampicillin-sulbactam (UNASYN) IV 3 g (05/29/20 0529)  . DAPTOmycin (CUBICIN)  IV Stopped (05/28/20 2153)     LOS: 11 days   Cherene Altes, MD [facility] Office   [phone] Pager - Text Page per Amion  If 7PM-7AM, please contact night-coverage per Amion 05/29/2020, 9:29 AM

## 2020-05-30 ENCOUNTER — Inpatient Hospital Stay (HOSPITAL_COMMUNITY): Payer: Medicare HMO

## 2020-05-30 DIAGNOSIS — M869 Osteomyelitis, unspecified: Secondary | ICD-10-CM | POA: Diagnosis not present

## 2020-05-30 DIAGNOSIS — G35 Multiple sclerosis: Secondary | ICD-10-CM | POA: Diagnosis not present

## 2020-05-30 LAB — CBC
HCT: 33.9 % — ABNORMAL LOW (ref 39.0–52.0)
Hemoglobin: 10.7 g/dL — ABNORMAL LOW (ref 13.0–17.0)
MCH: 28.8 pg (ref 26.0–34.0)
MCHC: 31.6 g/dL (ref 30.0–36.0)
MCV: 91.1 fL (ref 80.0–100.0)
Platelets: 514 10*3/uL — ABNORMAL HIGH (ref 150–400)
RBC: 3.72 MIL/uL — ABNORMAL LOW (ref 4.22–5.81)
RDW: 13.4 % (ref 11.5–15.5)
WBC: 15.2 10*3/uL — ABNORMAL HIGH (ref 4.0–10.5)
nRBC: 0 % (ref 0.0–0.2)

## 2020-05-30 LAB — BASIC METABOLIC PANEL
Anion gap: 8 (ref 5–15)
BUN: 16 mg/dL (ref 6–20)
CO2: 23 mmol/L (ref 22–32)
Calcium: 8.7 mg/dL — ABNORMAL LOW (ref 8.9–10.3)
Chloride: 103 mmol/L (ref 98–111)
Creatinine, Ser: 0.36 mg/dL — ABNORMAL LOW (ref 0.61–1.24)
GFR, Estimated: >60 mL/min (ref >60)
Glucose, Bld: 236 mg/dL — ABNORMAL HIGH (ref 70–99)
Potassium: 4.4 mmol/L (ref 3.5–5.1)
Sodium: 134 mmol/L — ABNORMAL LOW (ref 135–145)

## 2020-05-30 LAB — GLUCOSE, CAPILLARY
Glucose-Capillary: 155 mg/dL — ABNORMAL HIGH (ref 70–99)
Glucose-Capillary: 166 mg/dL — ABNORMAL HIGH (ref 70–99)
Glucose-Capillary: 170 mg/dL — ABNORMAL HIGH (ref 70–99)
Glucose-Capillary: 179 mg/dL — ABNORMAL HIGH (ref 70–99)
Glucose-Capillary: 198 mg/dL — ABNORMAL HIGH (ref 70–99)
Glucose-Capillary: 254 mg/dL — ABNORMAL HIGH (ref 70–99)

## 2020-05-30 MED ORDER — ALPRAZOLAM 0.25 MG PO TABS
0.2500 mg | ORAL_TABLET | Freq: Two times a day (BID) | ORAL | Status: DC
  Administered 2020-05-31 – 2020-06-05 (×11): 0.25 mg
  Filled 2020-05-30 (×11): qty 1

## 2020-05-30 MED ORDER — HEPARIN SOD (PORK) LOCK FLUSH 100 UNIT/ML IV SOLN
INTRAVENOUS | Status: AC
  Filled 2020-05-30: qty 5

## 2020-05-30 MED ORDER — DIAZEPAM 5 MG PO TABS: 5.0000 mg | ORAL_TABLET | Freq: Every evening | ORAL | Status: DC | PRN

## 2020-05-30 MED ORDER — LIDOCAINE HCL (PF) 1 % IJ SOLN
INTRAMUSCULAR | Status: AC
  Administered 2020-05-30: 2 mL via SUBCUTANEOUS
  Filled 2020-05-30: qty 30

## 2020-05-30 MED ORDER — LIVING WELL WITH DIABETES BOOK
Freq: Once | Status: AC
  Filled 2020-05-30: qty 1

## 2020-05-30 NOTE — Progress Notes (Signed)
Terry Palmer  HFW:[id number] DOB: 1975-09-03 DOA: 05/18/2020 PCP: [name], MD    Brief Narrative:  [phone] with a history of advanced MS with quadriplegia/contractures neurogenic bladder and nonverbal at baseline, chronic suprapubic catheter, chronic indwelling PEG tube, dementia, and seizure disorder who presented to the ER with fevers and worsening of a known decubitus ulcer. In the ER he was found to be tachycardic in the 120s but was not hypotensive. WBC was elevated. A large sacral wound was confirmed and CT of the area noted progression of the soft tissue wound overlying the left ischial tuberosity with underlying bony erosion consistent with osteomyelitis. He has been evaluated by general surgery who did not feel there was a role for a procedure.  ID is following and empiric antibiotics are being dosed.  Consultants:  General surgery ID  Code Status: FULL CODE  Antimicrobials:  Cefepime 5/11 > 5/12  vancomycin 5/11 > 5/13  Unasyn 5/12 > Daptomycin 5/13 >  DVT prophylaxis: SCDs  Subjective: Afebrile.  Vital signs are stable.  Appears to be resting comfortably in bed.  No evidence of distress or discomfort.  Does not interact with the examiner.  Assessment & Plan:  Sepsis POA due to large sacral wound with osteomyelitis Continue hydrotherapy - ID recommends 6 weeks of IV antibiotic therapy with Unasyn and daptomycin -order placed for IR to insert PICC line - HHRN arrangements in place for IV abx and required labs to monitor   Acute altered mental status Patient was much less interactive at time of admission -EEG negative for acute seizure activity -has returned to baseline intermittent awake arousable but nonverbal status -wean benzo as patient is not on this at home chronically  DM2 A1c 8.6 -this is a new formal diagnosis for the patient -CBG better controlled with adjustment in treatment   Bacteremia versus contamination 1 of 4 bottles positive for staph - most  consistent with contamination  Hypokalemia Corrected with supplementation  Diarrhea C. difficile negative -GI pathogen panel negative  Advanced chronic MS with functional quadriplegia, dysphagia/PEG tube dependence, neurogenic bladder with chronic suprapubic catheter  Seizure disorder Continue Keppra  Goals of care Palliative Care has been consulted but the patient's wife insists that he would wish to remain full code - medically it appears ongoing aggressive care of this patient would be futile     Family Communication: No family present at time of exam Status is: Inpatient  Remains inpatient appropriate because:Inpatient level of care appropriate due to severity of illness   Dispo: The patient is from: Home              Anticipated d/c is to: unclear              Patient currently is not medically stable to d/c.   Difficult to place patient No     Objective: Blood pressure 111/68, pulse (!) 103, temperature 98.5 F (36.9 C), temperature source Axillary, resp. rate 19, height 6\' 4"  (1.93 m), weight 83.9 kg, SpO2 100 %.  Intake/Output Summary (Last 24 hours) at 05/30/2020 0933 Last data filed at 05/30/2020 0559 Gross per 24 hour  Intake 296.26 ml  Output 1440 ml  Net -1143.74 ml   Filed Weights   05/18/20 2157 05/18/20 2246  Weight: 83.9 kg 83.9 kg    Examination: General: No acute respiratory distress Lungs: Clear to auscultation B without wheezing Cardiovascular: Regular rhythm without murmur Abdomen: Nondistended, soft, bowel sounds positive -feeding tube insertion clean and dry Extremities:  No signif edema B LE   CBC: Recent Labs  Lab 05/24/20 0119 05/25/20 0711 05/26/20 0320 05/27/20 0034 05/30/20 0143  WBC 13.7* 13.1* 14.9* 14.9* 15.2*  NEUTROABS 8.9* 8.2* 10.5*  --   --   HGB 10.6* 10.6* 10.6* 10.5* 10.7*  HCT 33.4* 32.6* 33.8* 33.5* 33.9*  MCV 93.0 90.3 91.6 92.8 91.1  PLT 457* 527* 554* 562* 417*   Basic Metabolic Panel: Recent Labs  Lab  05/25/20 0711 05/27/20 0034 05/30/20 0143  NA 136 136 134*  K 4.3 4.2 4.4  CL 102 103 103  CO2 24 24 23   GLUCOSE 180* 230* 236*  BUN 14 17 16   CREATININE 0.35* 0.34* 0.36*  CALCIUM 8.6* 8.7* 8.7*   GFR: Estimated Creatinine Clearance: 136.9 mL/min (A) (by C-G formula based on SCr of 0.36 mg/dL (L)).  Liver Function Tests: Recent Labs  Lab 05/27/20 0034  AST 25  ALT 23  ALKPHOS 95  BILITOT 0.3  PROT 7.0  ALBUMIN 2.6*    Cardiac Enzymes: Recent Labs  Lab 05/27/20 0034  CKTOTAL 69    HbA1C: Hgb A1c MFr Bld  Date/Time Value Ref Range Status  05/20/2020 08:27 AM 8.6 (H) 4.8 - 5.6 % Final    Comment:    (NOTE) Pre diabetes:          5.7%-6.4%  Diabetes:              >6.4%  Glycemic control for   <7.0% adults with diabetes     CBG: Recent Labs  Lab 05/29/20 1718 05/29/20 2003 05/29/20 2354 05/30/20 0405 05/30/20 0742  GLUCAP 209* 212* 247* 170* 155*     Scheduled Meds: . ALPRAZolam  0.25 mg Per Tube TID  . vitamin C  500 mg Per Tube Daily  . baclofen  20 mg Per Tube QID  . buPROPion  75 mg Per Tube TID  . Chlorhexidine Gluconate Cloth  6 each Topical Daily  . cloNIDine  0.1 mg Per Tube TID  . collagenase   Topical Daily  . dantrolene  50 mg Per Tube 4 times per day  . feeding supplement (JEVITY 1.5 CAL/FIBER)  1,000 mL Per Tube Q24H  . feeding supplement (PROSource TF)  90 mL Per Tube BID  . folic acid  1 mg Per Tube Daily  . free water  240 mL Per Tube Q4H  . Gerhardt's butt cream   Topical TID  . insulin aspart  0-15 Units Subcutaneous Q4H  . insulin detemir  20 Units Subcutaneous Daily  . levETIRAcetam  1,000 mg Per Tube TID  . multivitamin with minerals  1 tablet Per Tube Daily  . nutrition supplement (JUVEN)  1 packet Per Tube BID BM  . pantoprazole sodium  40 mg Per Tube Daily  . polyvinyl alcohol  1 drop Both Eyes BID  . vitamin B-12  500 mcg Per Tube Daily   Continuous Infusions: . sodium chloride    . ampicillin-sulbactam  (UNASYN) IV 200 mL/hr at 05/30/20 0559  . DAPTOmycin (CUBICIN)  IV 650 mg (05/29/20 2003)     LOS: 12 days   Cherene Altes, MD [facility] Office  [phone] Pager - Text Page per Amion  If 7PM-7AM, please contact night-coverage per Amion 05/30/2020, 9:33 AM

## 2020-05-30 NOTE — Procedures (Signed)
Interventional Radiology Procedure Note  Procedure: PICC placement  Findings: Please refer to procedural dictation for full description. Right basilic vein, 40 cm, 5 Fr dual lumen PICC.  Tip at cavoatrial junction.  Complications: None immediate  Estimated Blood Loss: < 5 mL  Recommendations: PICC ready for immediate use.   Ruthann Cancer, MD

## 2020-05-30 NOTE — Progress Notes (Signed)
Inpatient Diabetes Program Recommendations  AACE/ADA: New Consensus Statement on Inpatient Glycemic Control (2015)  Target Ranges:  Prepandial:   less than 140 mg/dL      Peak postprandial:   less than 180 mg/dL (1-2 hours)      Critically ill patients:  140 - 180 mg/dL   Lab Results  Component Value Date   GLUCAP 155 (H) 05/30/2020   HGBA1C 8.6 (H) 05/20/2020    Review of Glycemic Control Results for Terry Palmer, Terry Palmer (MRN [id number]) as of 05/30/2020 10:41  Ref. Range 05/29/2020 07:53 05/29/2020 11:59 05/29/2020 13:53 05/29/2020 17:18 05/29/2020 20:03 05/29/2020 23:54 05/30/2020 04:05 05/30/2020 07:42  Glucose-Capillary Latest Ref Range: 70 - 99 mg/dL 100 (H) 132 (H) 167 (H) 209 (H) 212 (H) 247 (H) 170 (H) 155 (H)   Diabetes history: No prior hx DM Outpatient Diabetes medications: None Current orders for Inpatient glycemic control: Levemir 20 units + Novolog 0-15 units q 4 hrs.  Inpatient Diabetes Program Recommendations:   Noted patient is new onset DM2. Ordered Living Well With Diabetes, dietician consult, and will plan to see patient and wife today.  Thank you, Nani Gasser. Temekia Caskey, RN, MSN, CDE  Diabetes Coordinator Inpatient Glycemic Control Team Team Pager [phone] (8am-5pm) 05/30/2020 10:43 AM

## 2020-05-30 NOTE — Progress Notes (Signed)
Physical Therapy Wound Treatment Patient Details  Name: Terry Palmer MRN: [id number] Date of Birth: [birth year]  Today's Date: 05/30/2020 Time: 2683-4196 Time Calculation (min): 45 min  Subjective  Subjective Assessment Subjective: None - pt obtunded throughout the entirety of session and nonverbal at baseline Patient and Family Stated Goals: None Date of Onset:  (Unknown) Prior Treatments:  (Dressing changes)  Pain Score:  Pt did not appear to be in pain throughout treatment.   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 (Active)  Dressing Type ABD;Barrier Film (skin prep);Gauze (Comment);Moist to moist 05/30/20 1301  Dressing Changed;Clean;Dry;Intact 05/30/20 1301  Dressing Change Frequency Daily 05/30/20 1301  State of Healing Early/partial granulation 05/30/20 0408  Site / Wound Assessment Yellow;Pink;Red;Brown 05/30/20 1301  % Wound base Red or Granulating 65% 05/30/20 1301  % Wound base Yellow/Fibrinous Exudate 25% 05/30/20 1301  % Wound base Black/Eschar 5% 05/30/20 1301  % Wound base Other/Granulation Tissue (Comment) 5% 05/30/20 1301  Peri-wound Assessment Bleeding 05/30/20 1301  Wound Length (cm) 13 cm 05/27/20 1200  Wound Width (cm) 11.5 cm 05/27/20 1200  Wound Depth (cm) 4.3 cm 05/27/20 1200  Wound Surface Area (cm^2) 149.5 cm^2 05/27/20 1200  Wound Volume (cm^3) 642.85 cm^3 05/27/20 1200  Tunneling (cm) 5 05/20/20 1325  Undermining (cm) 6.1 at 12:00 05/27/20 1200  Margins Unattached edges (unapproximated) 05/30/20 1301  Drainage Amount Moderate 05/30/20 1301  Drainage Description Serosanguineous 05/30/20 1301  Treatment Debridement (Selective);Hydrotherapy (Pulse lavage);Packing (Saline gauze) 05/30/20 1301   Hydrotherapy Pulsed lavage therapy - wound location: L ischial tuberosity Pulsed Lavage with Suction (psi): 8 psi (to 12) Pulsed Lavage with Suction -  Normal Saline Used: 1000 mL Pulsed Lavage Tip: Tip with splash shield Selective Debridement Selective Debridement - Location: L ischial tuberosity Selective Debridement - Tools Used: Forceps,Scalpel,Scissors Selective Debridement - Tissue Removed: yellow and brown unviable tissue    Wound Assessment and Plan  Wound Therapy - Assess/Plan/Recommendations Wound Therapy - Clinical Statement: Progressing with debridement. New connection between original wound and area superior to wound that is also necrotic. Now that area is opened and connected, we are able to visualize more necrosis deep into the original wound. This patient will benefit from continued hydrotherapy for selective removal of unviable tissue, to decrease bioburden, and promote wound bed healing. Wound Therapy - Functional Problem List: Decreased strength, AROM, contractures, immobility due to MS Factors Delaying/Impairing Wound Healing: Immobility,Multiple medical problems,Incontinence Hydrotherapy Plan: Debridement,Dressing change,Pulsatile lavage with suction,Patient/family education Wound Therapy - Frequency: 6X / week Wound Therapy - Follow Up Recommendations: dressing changes by RN  Wound Therapy Goals- Improve the function of patient's integumentary system by progressing the wound(s) through the phases of wound healing (inflammation - proliferation - remodeling) by: Wound Therapy Goals - Improve the function of patient's integumentary system by progressing the wound(s) through the phases of wound healing by: Decrease Necrotic Tissue to: 20% Decrease Necrotic Tissue - Progress: Progressing toward goal Increase Granulation Tissue to: 80% Increase Granulation Tissue - Progress: Progressing toward goal Goals/treatment plan/discharge plan were made with and agreed upon by patient/family: Yes Time For Goal Achievement: 7 days Wound Therapy - Potential for Goals: Fair  Goals will be updated until maximal potential achieved or  discharge criteria met.  Discharge criteria: when goals achieved, discharge from hospital, MD decision/surgical intervention, no progress towards goals, refusal/missing three consecutive treatments without notification or medical reason.  GP     Charges PT  Wound Care Charges $Wound Debridement up to 20 cm: < or equal to 20 cm $ Wound Debridement each add'l 20 sqcm: 2 $PT PLS Gun and Tip: 1 Supply $PT Hydrotherapy Visit: 1 Visit       Thelma Comp 05/30/2020, 1:08 PM  Rolinda Roan, PT, DPT Acute Rehabilitation Services Pager: [phone] Office: [phone]

## 2020-05-31 DIAGNOSIS — M869 Osteomyelitis, unspecified: Secondary | ICD-10-CM | POA: Diagnosis not present

## 2020-05-31 DIAGNOSIS — G35 Multiple sclerosis: Secondary | ICD-10-CM | POA: Diagnosis not present

## 2020-05-31 LAB — GLUCOSE, CAPILLARY
Glucose-Capillary: 151 mg/dL — ABNORMAL HIGH (ref 70–99)
Glucose-Capillary: 173 mg/dL — ABNORMAL HIGH (ref 70–99)
Glucose-Capillary: 183 mg/dL — ABNORMAL HIGH (ref 70–99)
Glucose-Capillary: 188 mg/dL — ABNORMAL HIGH (ref 70–99)
Glucose-Capillary: 227 mg/dL — ABNORMAL HIGH (ref 70–99)
Glucose-Capillary: 236 mg/dL — ABNORMAL HIGH (ref 70–99)

## 2020-05-31 MED ORDER — PROSOURCE TF PO LIQD
45.0000 mL | Freq: Three times a day (TID) | ORAL | Status: DC
  Administered 2020-05-31 – 2020-06-05 (×14): 45 mL
  Filled 2020-05-31 (×14): qty 45

## 2020-05-31 MED ORDER — GLUCERNA 1.5 CAL PO LIQD
1000.0000 mL | ORAL | Status: DC
  Administered 2020-06-01 – 2020-06-02 (×2): 1000 mL
  Filled 2020-05-31 (×2): qty 1000

## 2020-05-31 MED ORDER — INSULIN DETEMIR 100 UNIT/ML ~~LOC~~ SOLN
22.0000 [IU] | Freq: Every day | SUBCUTANEOUS | Status: DC
  Administered 2020-06-01 – 2020-06-05 (×5): 22 [IU] via SUBCUTANEOUS
  Filled 2020-05-31 (×5): qty 0.22

## 2020-05-31 NOTE — Progress Notes (Signed)
Physical Therapy Wound Treatment Patient Details  Name: Terry Palmer MRN: [id number] Date of Birth: 09-08-1975  Today's Date: 05/31/2020 Time: 1122-1200 Time Calculation (min): 38 min  Subjective  Subjective Assessment Subjective: None - pt obtunded throughout the entirety of session and nonverbal at baseline Patient and Family Stated Goals: None Date of Onset:  (Unknown) Prior Treatments:  (Dressing changes)  Pain Score:  Pt did not appear to be in pain throughout session.  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 (Active)  Dressing Type ABD;Barrier Film (skin prep);Gauze (Comment);Moist to moist 05/31/20 1250  Dressing Changed;Clean;Dry;Intact 05/31/20 1250  Dressing Change Frequency Monday, Wednesday, Friday 05/31/20 1250  State of Healing Early/partial granulation 05/31/20 1250  Site / Wound Assessment Yellow;Pink;Red;Brown 05/31/20 1250  % Wound base Red or Granulating 70% 05/31/20 1250  % Wound base Yellow/Fibrinous Exudate 20% 05/31/20 1250  % Wound base Black/Eschar 5% 05/31/20 1250  % Wound base Other/Granulation Tissue (Comment) 5% 05/31/20 1250  Peri-wound Assessment Intact 05/31/20 1250  Wound Length (cm) 13 cm 05/27/20 1200  Wound Width (cm) 11.5 cm 05/27/20 1200  Wound Depth (cm) 4.3 cm 05/27/20 1200  Wound Surface Area (cm^2) 149.5 cm^2 05/27/20 1200  Wound Volume (cm^3) 642.85 cm^3 05/27/20 1200  Tunneling (cm) 5 05/20/20 1325  Undermining (cm) 6.1 at 12:00 05/27/20 1200  Margins Unattached edges (unapproximated) 05/31/20 1250  Drainage Amount Moderate 05/30/20 2029  Drainage Description Serosanguineous 05/31/20 1250  Treatment Debridement (Selective);Hydrotherapy (Pulse lavage);Packing (Saline gauze) 05/31/20 1250   Hydrotherapy Pulsed lavage therapy - wound location: L ischial tuberosity Pulsed Lavage with Suction (psi): 8 psi (to 12) Pulsed Lavage with  Suction - Normal Saline Used: 1000 mL Pulsed Lavage Tip: Tip with splash shield Selective Debridement Selective Debridement - Location: L ischial tuberosity Selective Debridement - Tools Used: Forceps,Scalpel,Scissors Selective Debridement - Tissue Removed: yellow and brown unviable tissue    Wound Assessment and Plan  Wound Therapy - Assess/Plan/Recommendations Wound Therapy - Clinical Statement: Wound appearance continues to improve. Feel this wound is now appropriate for hydrotherapy intervention 3x/week. We will perform hydrotherapy again on Friday of this week (5/27) and begin a MWF schedule next week.  This patient will benefit from continued hydrotherapy for selective removal of unviable tissue, to decrease bioburden, and promote wound bed healing. Wound Therapy - Functional Problem List: Decreased strength, AROM, contractures, immobility due to MS Factors Delaying/Impairing Wound Healing: Immobility,Multiple medical problems,Incontinence Hydrotherapy Plan: Debridement,Dressing change,Pulsatile lavage with suction,Patient/family education Wound Therapy - Frequency: 3X / week Wound Therapy - Follow Up Recommendations: dressing changes by RN  Wound Therapy Goals- Improve the function of patient's integumentary system by progressing the wound(s) through the phases of wound healing (inflammation - proliferation - remodeling) by: Wound Therapy Goals - Improve the function of patient's integumentary system by progressing the wound(s) through the phases of wound healing by: Decrease Necrotic Tissue to: 20% Decrease Necrotic Tissue - Progress: Progressing toward goal Increase Granulation Tissue to: 80% Increase Granulation Tissue - Progress: Progressing toward goal Goals/treatment plan/discharge plan were made with and agreed upon by patient/family: Yes Time For Goal Achievement: 7 days Wound Therapy - Potential for Goals: Fair  Goals will be updated until maximal potential achieved or  discharge criteria met.  Discharge criteria: when goals achieved, discharge from hospital, MD decision/surgical intervention, no progress towards goals, refusal/missing three consecutive treatments without notification or medical reason.  GP     Charges PT Wound  Care Charges $Wound Debridement up to 20 cm: < or equal to 20 cm $ Wound Debridement each add'l 20 sqcm: 1 $PT Hydrotherapy Dressing: 1 dressing $PT PLS Gun and Tip: 1 Supply $PT Hydrotherapy Visit: 1 Visit       Thelma Comp 05/31/2020, 12:58 PM   Rolinda Roan, PT, DPT Acute Rehabilitation Services Pager: [phone] Office: [phone]

## 2020-05-31 NOTE — Progress Notes (Signed)
Nutrition Follow-up  DOCUMENTATION CODES:   Not applicable  INTERVENTION:   Tube Feeding:  Tube Feeding via G-tube: Trial of formula change to Glucerna 1.5 to evaluate if this improves blood sugar management. If not, recommend resuming Osmolite 1.5   Glucerna 1.5 at 85 ml/hr x 16 hours per day Continue Pro-Source 45 mL TID Provides 2040 kcals, 139 g of protein and 973 mL of free water  Continue Juven BID, each packet provides 80 calories, 8 grams of carbohydrate, 2.5  grams of protein (collagen), 7 grams of L-arginine and 7 grams of L-glutamine; supplement contains CaHMB, Vitamins C, E, B12 and Zinc to promote wound healing. Recommend Juven BID being continued at discharge    NUTRITION DIAGNOSIS:   Increased nutrient needs related to wound healing as evidenced by estimated needs.  Being addressed via TF   GOAL:   Patient will meet greater than or equal to 90% of their needs  Progressing  MONITOR:   TF tolerance,Skin,Weight trends  REASON FOR ASSESSMENT:   New TF    ASSESSMENT:   45 yo male admitted with sepsis due to large sacral wound with osteomyelitis. Pt with advanced MS, nonverbal at baseline, functional quad with contractures, dysphagia with PEG tube.  Tolerating Jevity 1.5 at 80 ml/hr, Pro-Source TF 90 mL BID via PEG  New dx of DM, HgbA1c 8.6. Diet education not appropriate give pt is NPO and sole nutrition comes from TF.   Continues to receive hydrotherapy to stage IV ischial tuberosity  Current bed does not weight patient; unable to obtain new weight at this time  Labs: CBGs 151-254 Meds: Vit C, ss novolog, levemir, MVI with Minerals, Vit B-12   Diet Order:   Diet Order            Diet NPO time specified  Diet effective now                 EDUCATION NEEDS:   Not appropriate for education at this time  Skin:  Skin Assessment: Reviewed RN Assessment  Last BM:  5/15  Height:   Ht Readings from Last 1 Encounters:  05/18/20 6\' 4"  (1.93  m)    Weight:   Wt Readings from Last 1 Encounters:  05/18/20 83.9 kg    Ideal Body Weight:     BMI:  Body mass index is 22.52 kg/m.  Estimated Nutritional Needs:   Kcal:  2000-2200 kcals  Protein:  125-165 g  Fluid:  >/= 2 L   Kerman Passey MS, RDN, LDN, CNSC Registered Dietitian III Clinical Nutrition RD Pager and On-Call Pager Number Located in [place]

## 2020-05-31 NOTE — Progress Notes (Signed)
Terry Palmer  XTA:[id number] DOB: [birth year] DOA: 05/18/2020 PCP: [name], MD    Brief Narrative:  [phone] with a history of advanced MS with quadriplegia/contractures neurogenic bladder and nonverbal at baseline, chronic suprapubic catheter, chronic indwelling PEG tube, dementia, and seizure disorder who presented to the ER with fevers and worsening of a known decubitus ulcer. In the ER he was found to be tachycardic in the 120s but was not hypotensive. WBC was elevated. A large sacral wound was confirmed and CT of the area noted progression of the soft tissue wound overlying the left ischial tuberosity with underlying bony erosion consistent with osteomyelitis. He has been evaluated by general surgery who did not feel there was a role for a procedure.  ID is following and empiric antibiotics are being dosed.  Consultants:  General surgery ID  Code Status: FULL CODE  Antimicrobials:  Cefepime 5/11 > 5/12  vancomycin 5/11 > 5/13  Unasyn 5/12 > Daptomycin 5/13 >  DVT prophylaxis: SCDs  Subjective: PICC line able to be placed by IR 5/23.  Ongoing hydrotherapy with significant necrotic tissue still being noted at time of treatment.  Afebrile.  Vital signs otherwise stable.  Appears to be resting comfortably in bed at the time of my visit.  Assessment & Plan:  Sepsis POA due to large sacral wound with osteomyelitis Continue hydrotherapy until burden of necrotic tissue is further improved - ID recommends 6 weeks of IV antibiotic therapy with Unasyn and daptomycin - PICC line in place to facilitate this - HHRN arrangements in place for IV abx and required labs to monitor   Acute altered mental status Patient was much less interactive at time of admission -EEG negative for acute seizure activity -has returned to baseline intermittent awake arousable but nonverbal status -weaning benzo to off as patient is not on this at home chronically  DM2 A1c 8.6 -this is a new formal  diagnosis for the patient -CBG trending upward - adjust tx today and follow   Bacteremia versus contamination 1 of 4 bottles positive for staph - most consistent with contamination  Hypokalemia Corrected with supplementation  Diarrhea C. difficile negative -GI pathogen panel negative  Advanced chronic MS with functional quadriplegia, dysphagia/PEG tube dependence, neurogenic bladder with chronic suprapubic catheter  Seizure disorder Continue Keppra  Goals of care Palliative Care has been consulted but the patient's wife insists that he would wish to remain full code - medically it appears ongoing aggressive care of this patient would be futile     Family Communication: No family present at time of exam Status is: Inpatient  Remains inpatient appropriate because:Inpatient level of care appropriate due to severity of illness   Dispo: The patient is from: Home              Anticipated d/c is to: unclear              Patient currently is not medically stable to d/c.   Difficult to place patient No     Objective: Blood pressure 107/71, pulse (!) 104, temperature 98.7 F (37.1 C), temperature source Oral, resp. rate 16, height 6\' 4"  (1.93 m), weight 83.9 kg, SpO2 100 %.  Intake/Output Summary (Last 24 hours) at 05/31/2020 1118 Last data filed at 05/31/2020 0439 Gross per 24 hour  Intake 102.33 ml  Output 2400 ml  Net -2297.67 ml   Filed Weights   05/18/20 2157 05/18/20 2246  Weight: 83.9 kg 83.9 kg    Examination: General: No  acute respiratory distress Lungs: Clear to auscultation B  Cardiovascular: Regular rhythm  Abdomen: Nondistended, soft, bowel sounds positive  Extremities: No signif edema B LE - arms and legs contracted   CBC: Recent Labs  Lab 05/25/20 0711 05/26/20 0320 05/27/20 0034 05/30/20 0143  WBC 13.1* 14.9* 14.9* 15.2*  NEUTROABS 8.2* 10.5*  --   --   HGB 10.6* 10.6* 10.5* 10.7*  HCT 32.6* 33.8* 33.5* 33.9*  MCV 90.3 91.6 92.8 91.1  PLT 527*  554* 562* 314*   Basic Metabolic Panel: Recent Labs  Lab 05/25/20 0711 05/27/20 0034 05/30/20 0143  NA 136 136 134*  K 4.3 4.2 4.4  CL 102 103 103  CO2 24 24 23   GLUCOSE 180* 230* 236*  BUN 14 17 16   CREATININE 0.35* 0.34* 0.36*  CALCIUM 8.6* 8.7* 8.7*   GFR: Estimated Creatinine Clearance: 136.9 mL/min (A) (by C-G formula based on SCr of 0.36 mg/dL (L)).  Liver Function Tests: Recent Labs  Lab 05/27/20 0034  AST 25  ALT 23  ALKPHOS 95  BILITOT 0.3  PROT 7.0  ALBUMIN 2.6*    Cardiac Enzymes: Recent Labs  Lab 05/27/20 0034  CKTOTAL 69    HbA1C: Hgb A1c MFr Bld  Date/Time Value Ref Range Status  05/20/2020 08:27 AM 8.6 (H) 4.8 - 5.6 % Final    Comment:    (NOTE) Pre diabetes:          5.7%-6.4%  Diabetes:              >6.4%  Glycemic control for   <7.0% adults with diabetes     CBG: Recent Labs  Lab 05/30/20 1600 05/30/20 2020 05/30/20 2356 05/31/20 0408 05/31/20 0746  GLUCAP 179* 254* 198* 183* 151*     Scheduled Meds: . ALPRAZolam  0.25 mg Per Tube BID  . vitamin C  500 mg Per Tube Daily  . baclofen  20 mg Per Tube QID  . buPROPion  75 mg Per Tube TID  . Chlorhexidine Gluconate Cloth  6 each Topical Daily  . cloNIDine  0.1 mg Per Tube TID  . collagenase   Topical Daily  . dantrolene  50 mg Per Tube 4 times per day  . feeding supplement (JEVITY 1.5 CAL/FIBER)  1,000 mL Per Tube Q24H  . feeding supplement (PROSource TF)  90 mL Per Tube BID  . folic acid  1 mg Per Tube Daily  . free water  240 mL Per Tube Q4H  . Gerhardt's butt cream   Topical TID  . insulin aspart  0-15 Units Subcutaneous Q4H  . insulin detemir  20 Units Subcutaneous Daily  . levETIRAcetam  1,000 mg Per Tube TID  . multivitamin with minerals  1 tablet Per Tube Daily  . nutrition supplement (JUVEN)  1 packet Per Tube BID BM  . pantoprazole sodium  40 mg Per Tube Daily  . polyvinyl alcohol  1 drop Both Eyes BID  . vitamin B-12  500 mcg Per Tube Daily   Continuous  Infusions: . sodium chloride    . ampicillin-sulbactam (UNASYN) IV 3 g (05/31/20 0552)  . DAPTOmycin (CUBICIN)  IV 650 mg (05/30/20 2036)     LOS: 13 days   Cherene Altes, MD [facility] Office  [phone] Pager - Text Page per Amion  If 7PM-7AM, please contact night-coverage per Amion 05/31/2020, 11:18 AM

## 2020-06-01 DIAGNOSIS — A419 Sepsis, unspecified organism: Secondary | ICD-10-CM | POA: Diagnosis not present

## 2020-06-01 DIAGNOSIS — Z931 Gastrostomy status: Secondary | ICD-10-CM

## 2020-06-01 DIAGNOSIS — M869 Osteomyelitis, unspecified: Secondary | ICD-10-CM | POA: Diagnosis not present

## 2020-06-01 DIAGNOSIS — G35 Multiple sclerosis: Secondary | ICD-10-CM | POA: Diagnosis not present

## 2020-06-01 LAB — GLUCOSE, CAPILLARY
Glucose-Capillary: 122 mg/dL — ABNORMAL HIGH (ref 70–99)
Glucose-Capillary: 129 mg/dL — ABNORMAL HIGH (ref 70–99)
Glucose-Capillary: 129 mg/dL — ABNORMAL HIGH (ref 70–99)
Glucose-Capillary: 155 mg/dL — ABNORMAL HIGH (ref 70–99)
Glucose-Capillary: 191 mg/dL — ABNORMAL HIGH (ref 70–99)
Glucose-Capillary: 235 mg/dL — ABNORMAL HIGH (ref 70–99)

## 2020-06-01 NOTE — Progress Notes (Signed)
PROGRESS NOTE    Terry Palmer  [id number]:[id number] DOB: Oct 28, 1975 DOA: 05/18/2020 PCP: [name], MD   Brief Narrative: Terry Palmer is a 45 y.o. male with a history of advanced multiple sclerosis with quadriplegia, contractures, neurogenic bladder in addition to chronic suprapubic catheter, chronic indwelling PEG tube, dementia, seizures.  Patient presented secondary to fevers and worsening of known sacral decubitus ulcer.  Sacral wound was noted to have significant necrotic tissue.  General surgery was consulted and recommended no surgical debridement and instead recommended hydrotherapy.  Patient was also evaluated for osteomyelitis secondary to sacral decubitus ulcer also was started on empiric antibiotics.  Infectious was consulted as well for management.  While admitted, patient had an event of decreased level of consciousness requiring transient ICU stay very.  EEG at that time was obtained and was negative for active seizures.  PICC line placed for anticipated long course of IV antibiotics as an outpatient.   Assessment & Plan:   Principal Problem:   Sepsis ([place]) Active Problems:   Multiple sclerosis ([place])   UTI (urinary tract infection)   CAP (community acquired pneumonia)   Osteomyelitis ([place])   Pressure injury of skin   Sepsis Present on admission.  Secondary to large sacral wound with osteomyelitis.  Blood cultures significant for 1 out of 4 positive for Staph hominis.  Management as mentioned below.  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  Associated necrotic tissue.  Infectious disease was consulted as mentioned above.  Patient was empirically treated with vancomycin, cefepime, azithromycin on admission and was transitioned to Unasyn with the addition of daptomycin per infectious recommendations.  Patient started on hydrotherapy via PT services. PICC line placed on 05/30/2020 -ID recommendations: Daptomycin IV/Unasyn IV x6 weeks total. End date of  6/25  Positive blood culture Infectious disease was on board.  Blood cultures significant for 1 out of 4 Staph hominis.  Per infectious's recommendations, likely contaminant and are currently not treating.  Unlikely true bacteremia.  Patient received a transthoracic echo which was significant for no evidence of valvular vegetations.  Diabetes mellitus type 2 Hemoglobin A1c of 8.6%.  New diagnosis this admission.  Patient started on Levemir and sliding scale insulin.  Possibly exacerbated by ongoing infection. -Continue Levemir 22 units daily and SSI  Acute mental status change Unspecified.  Unsure of etiology.  Neurology was consulted.  EEG was obtained and was negative for seizure activity. Patient was transiently transferred to ICU, but did not require prolonged stay.  Hypokalemia Repletion as needed.  Diarrhea C. difficile and GI pathogen panel were negative.  Improved.  Multiple sclerosis Advanced.  Patient with functional quadriplegia, dysphagia currently PEG tube dependent, neurogenic bladder with chronic suprapubic catheter.  Palliative care was consulted on admission.  At this time, desire is for full scope, full code. -Continue baclofen, dantrolene -Continue tube feeds  Primary hypertension -Continue clonidine  Dementia Noted on chart.  Secondary to multiple sclerosis. -Continue Xanax, Wellbutrin  Seizure disorder -Continue Keppra  Pressure injury Left ischial tuberosity and sacral.  Present on admission.   DVT prophylaxis: SCDs Code Status:   Code Status: Full Code Family Communication: None at bedside.  Called wife, no response. Disposition Plan: Discharge home in 1 to 2 days pending last session of hydrotherapy   Consultants:   Infectious disease  Critical care  Palliative care  General surgery  Procedures:   EEG (5/15)  Antimicrobials:  Vancomycin IV  Daptomycin IV  Unasyn IV  Azithromycin IV  Cefepime IV  Subjective: Patient is  nonverbal.  Currently moaning while I am in the room.  Objective: Vitals:   05/31/20 1022 05/31/20 2019 06/01/20 0003 06/01/20 0342  BP: 107/71 106/65 114/74 115/75  Pulse: (!) 104 (!) 101 (!) 101 96  Resp: 16 16 18 15   Temp: 98.7 F (37.1 C) 98.2 F (36.8 C) 98.3 F (36.8 C) 98.7 F (37.1 C)  TempSrc: Oral Oral Oral Oral  SpO2: 100% 100% 100% 100%  Weight:      Height:        Intake/Output Summary (Last 24 hours) at 06/01/2020 1443 Last data filed at 06/01/2020 0659 Gross per 24 hour  Intake 1473 ml  Output 1800 ml  Net -327 ml   Filed Weights   05/18/20 2157 05/18/20 2246  Weight: 83.9 kg 83.9 kg    Examination:  General exam: Appears calm Respiratory system: Clear to auscultation. Respiratory effort normal. Cardiovascular system: S1 & S2 heard, RRR. No murmurs, rubs, gallops or clicks. Gastrointestinal system: Abdomen is nondistended, soft and nontender. No organomegaly or masses felt. Normal bowel sounds heard. Central nervous system: Alert.  Multiple contractures of upper and lower extremities Musculoskeletal: No edema. No calf tenderness Skin: No cyanosis. No rashes Psychiatry: Nonverbal    Data Reviewed: I have personally reviewed following labs and imaging studies  CBC Lab Results  Component Value Date   WBC 15.2 (H) 05/30/2020   RBC 3.72 (L) 05/30/2020   HGB 10.7 (L) 05/30/2020   HCT 33.9 (L) 05/30/2020   MCV 91.1 05/30/2020   MCH 28.8 05/30/2020   PLT 514 (H) 05/30/2020   MCHC 31.6 05/30/2020   RDW 13.4 05/30/2020   LYMPHSABS 2.6 05/26/2020   MONOABS 1.0 05/26/2020   EOSABS 0.6 (H) 05/26/2020   BASOSABS 0.1 59/56/3875     Last metabolic panel Lab Results  Component Value Date   NA 134 (L) 05/30/2020   K 4.4 05/30/2020   CL 103 05/30/2020   CO2 23 05/30/2020   BUN 16 05/30/2020   CREATININE 0.36 (L) 05/30/2020   GLUCOSE 236 (H) 05/30/2020   GFRNONAA >60 05/30/2020   GFRAA >60 12/10/2017   CALCIUM 8.7 (L) 05/30/2020   PHOS 2.6  02/13/2016   PROT 7.0 05/27/2020   ALBUMIN 2.6 (L) 05/27/2020   BILITOT 0.3 05/27/2020   ALKPHOS 95 05/27/2020   AST 25 05/27/2020   ALT 23 05/27/2020   ANIONGAP 8 05/30/2020    CBG (last 3)  Recent Labs    06/01/20 0337 06/01/20 0850 06/01/20 1241  GLUCAP 191* 129* 129*     GFR: Estimated Creatinine Clearance: 136.9 mL/min (A) (by C-G formula based on SCr of 0.36 mg/dL (L)).  Coagulation Profile: No results for input(s): INR, PROTIME in the last 168 hours.  No results found for this or any previous visit (from the past 240 hour(s)).      Radiology Studies: No results found.      Scheduled Meds: . ALPRAZolam  0.25 mg Per Tube BID  . vitamin C  500 mg Per Tube Daily  . baclofen  20 mg Per Tube QID  . buPROPion  75 mg Per Tube TID  . Chlorhexidine Gluconate Cloth  6 each Topical Daily  . cloNIDine  0.1 mg Per Tube TID  . collagenase   Topical Daily  . dantrolene  50 mg Per Tube 4 times per day  . feeding supplement (GLUCERNA 1.5 CAL)  1,000 mL Per Tube Q24H  . feeding supplement (PROSource TF)  45 mL Per  Tube TID  . folic acid  1 mg Per Tube Daily  . free water  240 mL Per Tube Q4H  . Gerhardt's butt cream   Topical TID  . insulin aspart  0-15 Units Subcutaneous Q4H  . insulin detemir  22 Units Subcutaneous Daily  . levETIRAcetam  1,000 mg Per Tube TID  . multivitamin with minerals  1 tablet Per Tube Daily  . nutrition supplement (JUVEN)  1 packet Per Tube BID BM  . pantoprazole sodium  40 mg Per Tube Daily  . polyvinyl alcohol  1 drop Both Eyes BID  . vitamin B-12  500 mcg Per Tube Daily   Continuous Infusions: . sodium chloride    . ampicillin-sulbactam (UNASYN) IV 3 g (06/01/20 1255)  . DAPTOmycin (CUBICIN)  IV Stopped (05/31/20 2056)     LOS: 14 days     Cordelia Poche, MD [facility] 06/01/2020, 2:43 PM  If 7PM-7AM, please contact night-coverage [email or web address]

## 2020-06-02 ENCOUNTER — Other Ambulatory Visit (HOSPITAL_COMMUNITY): Payer: Self-pay

## 2020-06-02 DIAGNOSIS — A419 Sepsis, unspecified organism: Secondary | ICD-10-CM | POA: Diagnosis not present

## 2020-06-02 DIAGNOSIS — Z931 Gastrostomy status: Secondary | ICD-10-CM | POA: Diagnosis not present

## 2020-06-02 DIAGNOSIS — G35 Multiple sclerosis: Secondary | ICD-10-CM | POA: Diagnosis not present

## 2020-06-02 DIAGNOSIS — M869 Osteomyelitis, unspecified: Secondary | ICD-10-CM | POA: Diagnosis not present

## 2020-06-02 LAB — GLUCOSE, CAPILLARY
Glucose-Capillary: 163 mg/dL — ABNORMAL HIGH (ref 70–99)
Glucose-Capillary: 135 mg/dL — ABNORMAL HIGH (ref 70–99)
Glucose-Capillary: 137 mg/dL — ABNORMAL HIGH (ref 70–99)
Glucose-Capillary: 134 mg/dL — ABNORMAL HIGH (ref 70–99)
Glucose-Capillary: 134 mg/dL — ABNORMAL HIGH (ref 70–99)
Glucose-Capillary: 164 mg/dL — ABNORMAL HIGH (ref 70–99)

## 2020-06-02 MED ORDER — ACCU-CHEK GUIDE W/DEVICE KIT
1.0000 | PACK | 0 refills | Status: DC | PRN
  Filled 2020-06-02: qty 1, 1d supply, fill #0

## 2020-06-02 MED ORDER — DAPTOMYCIN IV (FOR PTA / DISCHARGE USE ONLY): 650.0000 mg | INTRAVENOUS | 0 refills | Status: AC

## 2020-06-02 MED ORDER — ALPRAZOLAM 0.25 MG PO TABS
ORAL_TABLET | ORAL | 0 refills | Status: DC
  Filled 2020-06-02: qty 19, 16d supply, fill #0

## 2020-06-02 MED ORDER — BLOOD GLUCOSE MONITOR KIT
PACK | 0 refills | Status: DC
  Filled 2020-06-02: qty 1, fill #0

## 2020-06-02 MED ORDER — ACCU-CHEK SOFTCLIX LANCETS MISC
5 refills | Status: DC
  Filled 2020-06-02: qty 100, 25d supply, fill #0

## 2020-06-02 MED ORDER — ALPRAZOLAM 0.25 MG PO TABS: ORAL_TABLET | ORAL | 0 refills | Status: DC

## 2020-06-02 MED ORDER — AMPICILLIN-SULBACTAM IV (FOR PTA / DISCHARGE USE ONLY): 3.0000 g | Freq: Four times a day (QID) | INTRAVENOUS | 0 refills | Status: AC

## 2020-06-02 MED ORDER — ACCU-CHEK GUIDE VI STRP
ORAL_STRIP | 5 refills | Status: DC
  Filled 2020-06-02: qty 100, 25d supply, fill #0

## 2020-06-02 MED ORDER — GLUCERNA 1.5 CAL PO LIQD
1280.0000 mL | ORAL | Status: DC
  Administered 2020-06-02 – 2020-06-05 (×5): 1280 mL
  Filled 2020-06-02 (×5): qty 2000

## 2020-06-02 NOTE — Progress Notes (Signed)
PROGRESS NOTE    Terry Palmer  FYB:[id number] DOB: [birth year] DOA: 05/18/2020 PCP: [name], MD   Brief Narrative: Terry Palmer is a 45 y.o. male with a history of advanced multiple sclerosis with quadriplegia, contractures, neurogenic bladder in addition to chronic suprapubic catheter, chronic indwelling PEG tube, dementia, seizures.  Patient presented secondary to fevers and worsening of known sacral decubitus ulcer.  Sacral wound was noted to have significant necrotic tissue.  General surgery was consulted and recommended no surgical debridement and instead recommended hydrotherapy.  Patient was also evaluated for osteomyelitis secondary to sacral decubitus ulcer also was started on empiric antibiotics.  Infectious was consulted as well for management.  While admitted, patient had an event of decreased level of consciousness requiring transient ICU stay very.  EEG at that time was obtained and was negative for active seizures.  PICC line placed for anticipated long course of IV antibiotics as an outpatient.   Assessment & Plan:   Principal Problem:   Sepsis ([place]) Active Problems:   Multiple sclerosis ([place])   UTI (urinary tract infection)   CAP (community acquired pneumonia)   Osteomyelitis ([place])   Pressure injury of skin   Sepsis Present on admission.  Secondary to large sacral wound with osteomyelitis.  Blood cultures significant for 1 out of 4 positive for Staph hominis.  Management as mentioned below.  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  Associated necrotic tissue.  Infectious disease was consulted as mentioned above.  Patient was empirically treated with vancomycin, cefepime, azithromycin on admission and was transitioned to Unasyn with the addition of daptomycin per infectious recommendations.  Patient started on hydrotherapy via PT services. PICC line placed on 05/30/2020 -ID recommendations: Daptomycin IV/Unasyn IV x6 weeks total. End date of  6/25  Positive blood culture Infectious disease was on board.  Blood cultures significant for 1 out of 4 Staph hominis.  Per infectious's recommendations, likely contaminant and are currently not treating.  Unlikely true bacteremia.  Patient received a transthoracic echo which was significant for no evidence of valvular vegetations.  Diabetes mellitus type 2 Hemoglobin A1c of 8.6%.  New diagnosis this admission.  Patient started on Levemir and sliding scale insulin.  Possibly exacerbated by ongoing infection. -Continue Levemir 22 units daily and SSI  Acute mental status change Unspecified.  Unsure of etiology.  Neurology was consulted.  EEG was obtained and was negative for seizure activity. Patient was transiently transferred to ICU, but did not require prolonged stay.  Hypokalemia Repletion as needed.  Diarrhea C. difficile and GI pathogen panel were negative.  Improved.  Multiple sclerosis Advanced.  Patient with functional quadriplegia, dysphagia currently PEG tube dependent, neurogenic bladder with chronic suprapubic catheter.  Palliative care was consulted on admission.  At this time, desire is for full scope, full code. -Continue baclofen, dantrolene -Continue tube feeds  Primary hypertension -Continue clonidine  Dementia Noted on chart.  Secondary to multiple sclerosis. -Continue Xanax, Wellbutrin  Seizure disorder -Continue Keppra  Pressure injury Left ischial tuberosity and sacral.  Present on admission.   DVT prophylaxis: SCDs Code Status:   Code Status: Full Code Family Communication: None at bedside.  Called wife, no response (06/02/2020) Disposition Plan: Discharge home in 24 hours pending [place] arrangements and last hydrotherapy session   Consultants:   Infectious disease  Critical care  Palliative care  General surgery  Procedures:   EEG (5/15)  Antimicrobials:  Vancomycin IV  Daptomycin IV  Unasyn IV  Azithromycin IV  Cefepime  IV  Subjective: Non verbal.  Objective: Vitals:   06/02/20 0000 06/02/20 0431 06/02/20 0941 06/02/20 1349  BP: 113/76 106/61 125/77 121/75  Pulse: 96 (!) 101 100 100  Resp: 18 18 14 18   Temp: 98.5 F (36.9 C) 98.6 F (37 C) 98.4 F (36.9 C) 98.8 F (37.1 C)  TempSrc: Axillary Axillary Oral Oral  SpO2: 100% 100% 100% 100%  Weight:      Height:        Intake/Output Summary (Last 24 hours) at 06/02/2020 1539 Last data filed at 06/02/2020 1500 Gross per 24 hour  Intake 1380 ml  Output 2400 ml  Net -1020 ml   Filed Weights   05/18/20 2157 05/18/20 2246  Weight: 83.9 kg 83.9 kg    Examination:  General exam: Appears calm Respiratory system: Clear to auscultation. Respiratory effort normal. Cardiovascular system: S1 & S2 heard, RRR. No murmurs, rubs, gallops or clicks. Gastrointestinal system: Abdomen is nondistended, soft and nontender. No organomegaly or masses felt. Normal bowel sounds heard. Central nervous system: Alert Musculoskeletal: No edema. No calf tenderness. Significant contractures Skin: No cyanosis. No rashes   Data Reviewed: I have personally reviewed following labs and imaging studies  CBC Lab Results  Component Value Date   WBC 15.2 (H) 05/30/2020   RBC 3.72 (L) 05/30/2020   HGB 10.7 (L) 05/30/2020   HCT 33.9 (L) 05/30/2020   MCV 91.1 05/30/2020   MCH 28.8 05/30/2020   PLT 514 (H) 05/30/2020   MCHC 31.6 05/30/2020   RDW 13.4 05/30/2020   LYMPHSABS 2.6 05/26/2020   MONOABS 1.0 05/26/2020   EOSABS 0.6 (H) 05/26/2020   BASOSABS 0.1 37/34/2876     Last metabolic panel Lab Results  Component Value Date   NA 134 (L) 05/30/2020   K 4.4 05/30/2020   CL 103 05/30/2020   CO2 23 05/30/2020   BUN 16 05/30/2020   CREATININE 0.36 (L) 05/30/2020   GLUCOSE 236 (H) 05/30/2020   GFRNONAA >60 05/30/2020   GFRAA >60 12/10/2017   CALCIUM 8.7 (L) 05/30/2020   PHOS 2.6 02/13/2016   PROT 7.0 05/27/2020   ALBUMIN 2.6 (L) 05/27/2020   BILITOT 0.3  05/27/2020   ALKPHOS 95 05/27/2020   AST 25 05/27/2020   ALT 23 05/27/2020   ANIONGAP 8 05/30/2020    CBG (last 3)  Recent Labs    06/02/20 0400 06/02/20 0737 06/02/20 1218  GLUCAP 135* 137* 134*     GFR: Estimated Creatinine Clearance: 136.9 mL/min (A) (by C-G formula based on SCr of 0.36 mg/dL (L)).  Coagulation Profile: No results for input(s): INR, PROTIME in the last 168 hours.  No results found for this or any previous visit (from the past 240 hour(s)).      Radiology Studies: No results found.      Scheduled Meds: . ALPRAZolam  0.25 mg Per Tube BID  . vitamin C  500 mg Per Tube Daily  . baclofen  20 mg Per Tube QID  . buPROPion  75 mg Per Tube TID  . Chlorhexidine Gluconate Cloth  6 each Topical Daily  . cloNIDine  0.1 mg Per Tube TID  . collagenase   Topical Daily  . dantrolene  50 mg Per Tube 4 times per day  . [START ON 06/03/2020] feeding supplement (GLUCERNA 1.5 CAL)  1,280 mL Per Tube Q24H  . feeding supplement (PROSource TF)  45 mL Per Tube TID  . folic acid  1 mg Per Tube Daily  . free water  240 mL Per Tube Q4H  . Gerhardt's butt cream   Topical TID  . insulin aspart  0-15 Units Subcutaneous Q4H  . insulin detemir  22 Units Subcutaneous Daily  . levETIRAcetam  1,000 mg Per Tube TID  . multivitamin with minerals  1 tablet Per Tube Daily  . nutrition supplement (JUVEN)  1 packet Per Tube BID BM  . pantoprazole sodium  40 mg Per Tube Daily  . polyvinyl alcohol  1 drop Both Eyes BID  . vitamin B-12  500 mcg Per Tube Daily   Continuous Infusions: . sodium chloride    . ampicillin-sulbactam (UNASYN) IV 3 g (06/02/20 1228)  . DAPTOmycin (CUBICIN)  IV 650 mg (06/01/20 2057)     LOS: 15 days     Cordelia Poche, MD [facility] 06/02/2020, 3:39 PM  If 7PM-7AM, please contact night-coverage [email or web address]

## 2020-06-02 NOTE — TOC Progression Note (Addendum)
Transition of Care [facility]) - Progression Note    Patient Details  Name: Terry Palmer MRN: [id number] Date of Birth: Oct 17, 1975  [facility] CM/SW Contact  Terry Lefevre Edson Snowball, RN Phone Number: 06/02/2020, 11:18 AM  Clinical Narrative:     Possible discharge tomorrow to home on IV antibiotics.   NCM called Terry Palmer (wife) [phone] and made aware . Patient will require ambulance transportation home. NCM confirmed address.   NCM spoke to [facility] with [employer] She will coordinate with patient's home [place] [phone] for lab draws etc.   NCM called [facility] [phone] spoke to Terry Palmer regarding discharge tomorrow on IV ABX/ lab draws. Terry Palmer will have [facility] call NCM back. Awaiting call back.   Terry Palmer with  [employer] returned call. She is aware of discharge tomorrow. Wound care orders given to Terry Palmer , per Terry Palmer they were providing the same wound  care prior to admission and have supplies on hand . Terry Palmer's LPN's at home have been signed off on adm IV ABX. Wife will need to provide blood sugar machine. NCM called TOC pharmacy for blood sugar machine, they requested script for Accucheck meter , test strips and lancets. NCM secure chatted MD for same.  Discussed with Terry Palmer, they can provide [facility] teaching on using the Accucheck and giving insulin. NCM called Terry Palmer regarding same and left voicemail. Terry Palmer has NCM direct cell number.    Terry Palmer requesting discharge summary be faxed to her at [phone] . Terry Palmer will discuss ABX and lab draws with [facility]     Expected Discharge Plan: [place] Barriers to Discharge: Continued Medical Work up  Expected Discharge Plan and Services Expected Discharge Plan: [place]   Discharge Planning Services: CM Consult Post Acute Care Choice: [place] arrangements for the past 2 months: Apartment                 DME Arranged: N/A DME Agency: NA        HH Arranged: RN           Social Determinants of Health (SDOH) Interventions    Readmission Risk Interventions No flowsheet data found.

## 2020-06-03 ENCOUNTER — Other Ambulatory Visit (HOSPITAL_COMMUNITY): Payer: Self-pay

## 2020-06-03 DIAGNOSIS — M869 Osteomyelitis, unspecified: Secondary | ICD-10-CM | POA: Diagnosis not present

## 2020-06-03 DIAGNOSIS — A419 Sepsis, unspecified organism: Secondary | ICD-10-CM | POA: Diagnosis not present

## 2020-06-03 DIAGNOSIS — Z931 Gastrostomy status: Secondary | ICD-10-CM | POA: Diagnosis not present

## 2020-06-03 DIAGNOSIS — G35 Multiple sclerosis: Secondary | ICD-10-CM | POA: Diagnosis not present

## 2020-06-03 LAB — GLUCOSE, CAPILLARY
Glucose-Capillary: 129 mg/dL — ABNORMAL HIGH (ref 70–99)
Glucose-Capillary: 133 mg/dL — ABNORMAL HIGH (ref 70–99)
Glucose-Capillary: 136 mg/dL — ABNORMAL HIGH (ref 70–99)
Glucose-Capillary: 138 mg/dL — ABNORMAL HIGH (ref 70–99)
Glucose-Capillary: 153 mg/dL — ABNORMAL HIGH (ref 70–99)
Glucose-Capillary: 155 mg/dL — ABNORMAL HIGH (ref 70–99)
Glucose-Capillary: 193 mg/dL — ABNORMAL HIGH (ref 70–99)

## 2020-06-03 LAB — CK: Total CK: 98 U/L (ref 49–397)

## 2020-06-03 NOTE — Plan of Care (Signed)
  Problem: Nutrition: Goal: Adequate nutrition will be maintained Outcome: Progressing   

## 2020-06-03 NOTE — Progress Notes (Signed)
Physical Therapy Wound Treatment and Discharge Patient Details  Name: Terry Palmer MRN: [id number] Date of Birth: April 16, 1975  Today's Date: 06/03/2020 Time: 6294-7654 Time Calculation (min): 33 min  Subjective  Subjective Assessment Subjective: None - pt obtunded throughout the entirety of session and nonverbal at baseline Patient and Family Stated Goals: None Date of Onset:  (Unknown) Prior Treatments:  (Dressing changes)  Pain Score:  Pt did not indicate he was in pain throughout session.   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 (Active)  Wound Image  Before debridement 06/03/20 1248  Dressing Type ABD;Barrier Film (skin prep);Gauze (Comment);Moist to moist 06/03/20 1248  Dressing Changed;Clean;Dry;Intact 06/03/20 1248  Dressing Change Frequency Monday, Wednesday, Friday 06/03/20 1248  State of Healing Early/partial granulation 06/03/20 1248  Site / Wound Assessment Red;Yellow;Black 06/03/20 1248  % Wound base Red or Granulating 80% 06/03/20 1248  % Wound base Yellow/Fibrinous Exudate 10% 06/03/20 1248  % Wound base Black/Eschar 5% 06/03/20 1248  % Wound base Other/Granulation Tissue (Comment) 5% 06/03/20 1248  Peri-wound Assessment Intact 06/03/20 1248  Wound Length (cm) 10 cm 06/03/20 1100  Wound Width (cm) 14 cm 06/03/20 1100  Wound Depth (cm) 3.8 cm 06/03/20 1100  Wound Surface Area (cm^2) 140 cm^2 06/03/20 1100  Wound Volume (cm^3) 532 cm^3 06/03/20 1100  Tunneling (cm) 5 05/20/20 1325  Undermining (cm) up to 4.5 10:00-12:00 06/03/20 1100  Margins Unattached edges (unapproximated) 06/03/20 1248  Drainage Amount Minimal 06/03/20 1248  Drainage Description Serosanguineous 06/03/20 1248  Treatment Debridement (Selective);Hydrotherapy (Pulse lavage);Packing (Saline gauze) 06/03/20 1248   Hydrotherapy Pulsed lavage therapy - wound location: L ischial tuberosity Pulsed  Lavage with Suction (psi): 8 psi (to 12) Pulsed Lavage with Suction - Normal Saline Used: 1000 mL Pulsed Lavage Tip: Tip with splash shield Selective Debridement Selective Debridement - Location: L ischial tuberosity Selective Debridement - Tools Used: Forceps,Scalpel,Scissors Selective Debridement - Tissue Removed: yellow and brown unviable tissue    Wound Assessment and Plan  Wound Therapy - Assess/Plan/Recommendations Wound Therapy - Clinical Statement: Wound bed appearance continues to improve and hydrotherapy goals are met this session. Updated picture and measurements provided above. Santyl has not been a part of our hydrotherapy POC per a verbal conversation with surgical PA after initial evaluation. I do think Santyl would be beneficial on the 10:00 area of the wound where necrotic tissue remains, and saline dressings to the rest of the wound bed moving forward. MD, please update orders if you agree. Hydrotherapy will sign off at this time. If needs change, please reconsult. Wound Therapy - Functional Problem List: Decreased strength, AROM, contractures, immobility due to MS Factors Delaying/Impairing Wound Healing: Immobility,Multiple medical problems,Incontinence Hydrotherapy Plan: Debridement,Dressing change,Pulsatile lavage with suction,Patient/family education Wound Therapy - Frequency: 3X / week Wound Therapy - Follow Up Recommendations: dressing changes by RN  Wound Therapy Goals- Improve the function of patient's integumentary system by progressing the wound(s) through the phases of wound healing (inflammation - proliferation - remodeling) by: Wound Therapy Goals - Improve the function of patient's integumentary system by progressing the wound(s) through the phases of wound healing by: Decrease Necrotic Tissue to: 20% Decrease Necrotic Tissue - Progress: Met Increase Granulation Tissue to: 80% Increase Granulation Tissue - Progress: Met Goals/treatment plan/discharge plan  were made with and agreed upon by patient/family: Yes Time For Goal Achievement: 7 days Wound Therapy - Potential for Goals: Fair  Goals will be updated until  maximal potential achieved or discharge criteria met.  Discharge criteria: when goals achieved, discharge from hospital, MD decision/surgical intervention, no progress towards goals, refusal/missing three consecutive treatments without notification or medical reason.  GP     Charges PT Wound Care Charges $Wound Debridement up to 20 cm: < or equal to 20 cm $PT Hydrotherapy Dressing: 1 dressing $PT PLS Gun and Tip: 1 Supply $PT Hydrotherapy Visit: 1 Visit       Thelma Comp 06/03/2020, 1:09 PM   Rolinda Roan, PT, DPT Acute Rehabilitation Services Pager: [phone] Office: [phone]

## 2020-06-03 NOTE — Progress Notes (Signed)
PROGRESS NOTE    Terry Palmer  RKY:[id number] DOB: Jan 18, 1975 DOA: 05/18/2020 PCP: [name], MD   Brief Narrative: Terry Palmer is a 45 y.o. male with a history of advanced multiple sclerosis with quadriplegia, contractures, neurogenic bladder in addition to chronic suprapubic catheter, chronic indwelling PEG tube, dementia, seizures.  Patient presented secondary to fevers and worsening of known sacral decubitus ulcer.  Sacral wound was noted to have significant necrotic tissue.  General surgery was consulted and recommended no surgical debridement and instead recommended hydrotherapy.  Patient was also evaluated for osteomyelitis secondary to sacral decubitus ulcer also was started on empiric antibiotics.  Infectious was consulted as well for management.  While admitted, patient had an event of decreased level of consciousness requiring transient ICU stay very.  EEG at that time was obtained and was negative for active seizures.  PICC line placed for anticipated long course of IV antibiotics as an outpatient.   Assessment & Plan:   Principal Problem:   Sepsis ([place]) Active Problems:   Multiple sclerosis ([place])   UTI (urinary tract infection)   CAP (community acquired pneumonia)   Osteomyelitis ([place])   Pressure injury of skin   Sepsis Present on admission.  Secondary to large sacral wound with osteomyelitis.  Blood cultures significant for 1 out of 4 positive for Staph hominis.  Management as mentioned below.  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  Associated necrotic tissue.  Infectious disease was consulted as mentioned above.  Patient was empirically treated with vancomycin, cefepime, azithromycin on admission and was transitioned to Unasyn with the addition of daptomycin per infectious recommendations.  Patient started on hydrotherapy via PT services. PICC line placed on 05/30/2020 -ID recommendations: Daptomycin IV/Unasyn IV x6 weeks total. End date of  6/25  Positive blood culture Infectious disease was on board.  Blood cultures significant for 1 out of 4 Staph hominis.  Per infectious's recommendations, likely contaminant and are currently not treating.  Unlikely true bacteremia.  Patient received a transthoracic echo which was significant for no evidence of valvular vegetations.  Diabetes mellitus type 2 Hemoglobin A1c of 8.6%.  New diagnosis this admission.  Patient started on Levemir and sliding scale insulin.  Possibly exacerbated by ongoing infection. -Continue Levemir 22 units daily and SSI  Acute mental status change Unspecified.  Unsure of etiology.  Neurology was consulted.  EEG was obtained and was negative for seizure activity. Patient was transiently transferred to ICU, but did not require prolonged stay.  Hypokalemia Repletion as needed.  Diarrhea C. difficile and GI pathogen panel were negative.  Improved.  Multiple sclerosis Advanced.  Patient with functional quadriplegia, dysphagia currently PEG tube dependent, neurogenic bladder with chronic suprapubic catheter.  Palliative care was consulted on admission.  At this time, desire is for full scope, full code. -Continue baclofen, dantrolene -Continue tube feeds  Primary hypertension -Continue clonidine  Dementia Noted on chart.  Secondary to multiple sclerosis. -Continue Xanax, Wellbutrin  Seizure disorder -Continue Keppra  Pressure injury Left ischial tuberosity and sacral.  Present on admission.   DVT prophylaxis: SCDs Code Status:   Code Status: Full Code Family Communication: None at bedside. Disposition Plan: Discharge home in 2 days pending home health availability. Medically stable for discharge.   Consultants:   Infectious disease  Critical care  Palliative care  General surgery  Procedures:   EEG (5/15)  Antimicrobials:  Vancomycin IV  Daptomycin IV  Unasyn IV  Azithromycin IV  Cefepime  IV  Subjective: Non-verbal  Objective: Vitals:   06/03/20 0009 06/03/20 0321 06/03/20 0800 06/03/20 1200  BP: 97/64 99/64 96/64  98/64  Pulse: 98 (!) 105 (!) 103 (!) 101  Resp: 18 19 19 18   Temp: 97.8 F (36.6 C) 98.6 F (37 C) 98.2 F (36.8 C) 98 F (36.7 C)  TempSrc: Oral Oral Oral Oral  SpO2: 100% 100%    Weight:      Height:        Intake/Output Summary (Last 24 hours) at 06/03/2020 1401 Last data filed at 06/03/2020 0948 Gross per 24 hour  Intake 1740 ml  Output 1750 ml  Net -10 ml   Filed Weights   05/18/20 2157 05/18/20 2246  Weight: 83.9 kg 83.9 kg    Examination:  General exam: Appears calm and comfortable Respiratory system: Clear to auscultation. Respiratory effort normal. Cardiovascular system: S1 & S2 heard, RRR. No murmurs, rubs, gallops or clicks. Gastrointestinal system: Abdomen is nondistended, soft and nontender. No organomegaly or masses felt. Normal bowel sounds heard. Central nervous system: Alert. Musculoskeletal: No edema. No calf tenderness. Severe contractures. Skin: No cyanosis. No rashes   Data Reviewed: I have personally reviewed following labs and imaging studies  CBC Lab Results  Component Value Date   WBC 15.2 (H) 05/30/2020   RBC 3.72 (L) 05/30/2020   HGB 10.7 (L) 05/30/2020   HCT 33.9 (L) 05/30/2020   MCV 91.1 05/30/2020   MCH 28.8 05/30/2020   PLT 514 (H) 05/30/2020   MCHC 31.6 05/30/2020   RDW 13.4 05/30/2020   LYMPHSABS 2.6 05/26/2020   MONOABS 1.0 05/26/2020   EOSABS 0.6 (H) 05/26/2020   BASOSABS 0.1 44/01/270     Last metabolic panel Lab Results  Component Value Date   NA 134 (L) 05/30/2020   K 4.4 05/30/2020   CL 103 05/30/2020   CO2 23 05/30/2020   BUN 16 05/30/2020   CREATININE 0.36 (L) 05/30/2020   GLUCOSE 236 (H) 05/30/2020   GFRNONAA >60 05/30/2020   GFRAA >60 12/10/2017   CALCIUM 8.7 (L) 05/30/2020   PHOS 2.6 02/13/2016   PROT 7.0 05/27/2020   ALBUMIN 2.6 (L) 05/27/2020   BILITOT 0.3  05/27/2020   ALKPHOS 95 05/27/2020   AST 25 05/27/2020   ALT 23 05/27/2020   ANIONGAP 8 05/30/2020    CBG (last 3)  Recent Labs    06/03/20 0736 06/03/20 1209 06/03/20 1226  GLUCAP 133* 155* 138*     GFR: Estimated Creatinine Clearance: 136.9 mL/min (A) (by C-G formula based on SCr of 0.36 mg/dL (L)).  Coagulation Profile: No results for input(s): INR, PROTIME in the last 168 hours.  No results found for this or any previous visit (from the past 240 hour(s)).      Radiology Studies: No results found.      Scheduled Meds: . ALPRAZolam  0.25 mg Per Tube BID  . vitamin C  500 mg Per Tube Daily  . baclofen  20 mg Per Tube QID  . buPROPion  75 mg Per Tube TID  . Chlorhexidine Gluconate Cloth  6 each Topical Daily  . cloNIDine  0.1 mg Per Tube TID  . collagenase   Topical Daily  . dantrolene  50 mg Per Tube 4 times per day  . feeding supplement (GLUCERNA 1.5 CAL)  1,280 mL Per Tube Q24H  . feeding supplement (PROSource TF)  45 mL Per Tube TID  . folic acid  1 mg Per Tube Daily  . free water  240 mL Per Tube Q4H  .  Gerhardt's butt cream   Topical TID  . insulin aspart  0-15 Units Subcutaneous Q4H  . insulin detemir  22 Units Subcutaneous Daily  . levETIRAcetam  1,000 mg Per Tube TID  . multivitamin with minerals  1 tablet Per Tube Daily  . nutrition supplement (JUVEN)  1 packet Per Tube BID BM  . pantoprazole sodium  40 mg Per Tube Daily  . polyvinyl alcohol  1 drop Both Eyes BID  . vitamin B-12  500 mcg Per Tube Daily   Continuous Infusions: . sodium chloride    . ampicillin-sulbactam (UNASYN) IV 3 g (06/03/20 1203)  . DAPTOmycin (CUBICIN)  IV 650 mg (06/02/20 2005)     LOS: 16 days     Cordelia Poche, MD [facility] 06/03/2020, 2:01 PM  If 7PM-7AM, please contact night-coverage [email or web address]

## 2020-06-03 NOTE — Plan of Care (Signed)
  Problem: Clinical Measurements: Goal: Will remain free from infection Outcome: Progressing Goal: Respiratory complications will improve Outcome: Progressing Goal: Cardiovascular complication will be avoided Outcome: Progressing   

## 2020-06-03 NOTE — TOC Progression Note (Addendum)
Transition of Care [facility]) - Progression Note    Patient Details  Name: Terry Palmer MRN: [id number] Date of Birth: 05/07/1975  [facility] CM/SW Contact  Jacalyn Lefevre Edson Snowball, RN Phone Number: 06/03/2020, 10:24 AM  Clinical Narrative:     Patient for discharge today.   Domenic Schwab [phone] from [facility] called. Vonna Kotyk stated patient cannot discharge today, they cannot support discharge today.     NCM spoke to [place] with [facility] yesterday and was told they were ready for a discharge 06/03/20. Vonna Kotyk stated Linus Orn was miss informed, wife will not have nursing support until Sunday. Wife will need to be taught how to check patient's blood sugar and give insulin prior to discharge . Accu check machine in patient's room. Bedside nurse will teach wife on checking blood sugar and giving insulin.   Pam with [facility] will call Domenic Schwab at [facility] directly regarding IV ABX and labs. Pam will also call wife for education.   NCM called [name] and explained above. [name] willing to come to hospital today for education. Pam will call [name] directly to arrange a time.   [name] came in for teaching with Pam regarding IV ABX . Patient will need to stay until Sunday when [facility] can provide more support .  Bedside nurse Remo Lipps will provide Accu check and adm insulin education.     Expected Discharge Plan: [place] Barriers to Discharge: Continued Medical Work up  Expected Discharge Plan and Services Expected Discharge Plan: [place]   Discharge Planning Services: CM Consult Post Acute Care Choice: [place] arrangements for the past 2 months: Apartment Expected Discharge Date: 06/03/20               DME Arranged: N/A DME Agency: NA       HH Arranged: RN           Social Determinants of Health (SDOH) Interventions    Readmission Risk Interventions No flowsheet data found.

## 2020-06-03 NOTE — Progress Notes (Signed)
Brief Nutrition Follow-up:  Blood sugars seem well controlled on current insulin regimen with change in feedings to Glucerna 1.5  TF discharge recommendations:  Glucerna 1.5 at 85 ml/hr x 16 hours per day (1360 mL/day) Pro-Source 45 mL TID Provides 2040 kcals, 139 g of protein and 973 mL of free water  Continue Juven BID, each packet provides 80 calories, 8 grams of carbohydrate, 2.5  grams of protein (collagen), 7 grams of L-arginine and 7 grams of L-glutamine; supplement contains CaHMB, Vitamins C, E, B12 and Zinc to promote wound healing   Recommend patient continue Glucerna 1.5 formula at discharge. If for some reason, insurance will not pay for or patient cannot get Glucerna 1.5 formula then recommend pt resuming Osmolite 1.5 formula at same rate of 85 ml/hr over 16 hours daily  Kerman Passey MS, RDN, LDN, CNSC Registered Dietitian III Clinical Nutrition RD Pager and On-Call Pager Number Located in [place]

## 2020-06-04 DIAGNOSIS — A419 Sepsis, unspecified organism: Secondary | ICD-10-CM | POA: Diagnosis not present

## 2020-06-04 LAB — GLUCOSE, CAPILLARY
Glucose-Capillary: 130 mg/dL — ABNORMAL HIGH (ref 70–99)
Glucose-Capillary: 139 mg/dL — ABNORMAL HIGH (ref 70–99)
Glucose-Capillary: 145 mg/dL — ABNORMAL HIGH (ref 70–99)
Glucose-Capillary: 151 mg/dL — ABNORMAL HIGH (ref 70–99)
Glucose-Capillary: 170 mg/dL — ABNORMAL HIGH (ref 70–99)
Glucose-Capillary: 171 mg/dL — ABNORMAL HIGH (ref 70–99)
Glucose-Capillary: 179 mg/dL — ABNORMAL HIGH (ref 70–99)

## 2020-06-04 NOTE — Progress Notes (Signed)
PROGRESS NOTE    Terry Palmer  FXT:[id number] DOB: [birth year] DOA: 05/18/2020 PCP: [name], MD   Brief Narrative: Terry Palmer is a 45 y.o. male with a history of advanced multiple sclerosis with quadriplegia, contractures, neurogenic bladder in addition to chronic suprapubic catheter, chronic indwelling PEG tube, dementia, seizures.  Patient presented secondary to fevers and worsening of known sacral decubitus ulcer.  Sacral wound was noted to have significant necrotic tissue.  General surgery was consulted and recommended no surgical debridement and instead recommended hydrotherapy.  Patient was also evaluated for osteomyelitis secondary to sacral decubitus ulcer also was started on empiric antibiotics.  Infectious was consulted as well for management.  While admitted, patient had an event of decreased level of consciousness requiring transient ICU stay very.  EEG at that time was obtained and was negative for active seizures.  PICC line placed for anticipated long course of IV antibiotics as an outpatient.   Assessment & Plan:   Principal Problem:   Sepsis ([place]) Active Problems:   Multiple sclerosis ([place])   UTI (urinary tract infection)   CAP (community acquired pneumonia)   Osteomyelitis ([place])   Pressure injury of skin   Sepsis Present on admission.  Secondary to large sacral wound with osteomyelitis.  Blood cultures significant for 1 out of 4 positive for Staph hominis.  Management as mentioned below.  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  Associated necrotic tissue.  Infectious disease was consulted as mentioned above.  Patient was empirically treated with vancomycin, cefepime, azithromycin on admission and was transitioned to Unasyn with the addition of daptomycin per infectious recommendations.  Patient started on hydrotherapy via PT services. PICC line placed on 05/30/2020 -ID recommendations: Daptomycin IV/Unasyn IV x6 weeks total. End date of  6/25  Positive blood culture Infectious disease was on board.  Blood cultures significant for 1 out of 4 Staph hominis.  Per infectious's recommendations, likely contaminant and are currently not treating.  Unlikely true bacteremia.  Patient received a transthoracic echo which was significant for no evidence of valvular vegetations.  Diabetes mellitus type 2 Hemoglobin A1c of 8.6%.  New diagnosis this admission.  Patient started on Levemir and sliding scale insulin.  Possibly exacerbated by ongoing infection. -Continue Levemir 22 units daily and SSI  Acute mental status change Unspecified.  Unsure of etiology.  Neurology was consulted.  EEG was obtained and was negative for seizure activity. Patient was transiently transferred to ICU, but did not require prolonged stay.  Hypokalemia Repletion as needed.  Diarrhea C. difficile and GI pathogen panel were negative.  Improved.  Multiple sclerosis Advanced.  Patient with functional quadriplegia, dysphagia currently PEG tube dependent, neurogenic bladder with chronic suprapubic catheter.  Palliative care was consulted on admission.  At this time, desire is for full scope, full code. -Continue baclofen, dantrolene -Continue tube feeds  Primary hypertension -Continue clonidine  Dementia Noted on chart.  Secondary to multiple sclerosis. -Continue Xanax, Wellbutrin  Seizure disorder -Continue Keppra  Pressure injury Left ischial tuberosity and sacral.  Present on admission.   DVT prophylaxis: SCDs Code Status:   Code Status: Full Code Family Communication: None at bedside. Disposition Plan: Discharge home in 1 day pending home health availability. Medically stable for discharge.   Consultants:   Infectious disease  Critical care  Palliative care  General surgery  Procedures:   EEG (5/15)  Antimicrobials:  Vancomycin IV  Daptomycin IV  Unasyn IV  Azithromycin IV  Cefepime  IV  Subjective: Non-verbal  Objective: Vitals:   06/04/20 0001 06/04/20 0428 06/04/20 1001 06/04/20 1437  BP: 92/63 102/64 128/79 136/79  Pulse: 93 97 95 (!) 101  Resp: 17 17 17 17   Temp: 97.9 F (36.6 C) 98.7 F (37.1 C) 98.4 F (36.9 C) 98.5 F (36.9 C)  TempSrc:  Oral Oral Oral  SpO2: 100% 99% 100% 100%  Weight:      Height:        Intake/Output Summary (Last 24 hours) at 06/04/2020 1445 Last data filed at 06/04/2020 1440 Gross per 24 hour  Intake 2028 ml  Output 2450 ml  Net -422 ml   Filed Weights   05/18/20 2157 05/18/20 2246  Weight: 83.9 kg 83.9 kg    Examination:  General exam: Appears calm and comfortable Respiratory system: Clear to auscultation. Respiratory effort normal. Cardiovascular system: S1 & S2 heard, RRR. No murmurs, rubs, gallops or clicks. Gastrointestinal system: Abdomen is nondistended, soft and nontender. No organomegaly or masses felt. Normal bowel sounds heard. Central nervous system: Alert Musculoskeletal: Severe contractures Skin: No cyanosis. No rashes   Data Reviewed: I have personally reviewed following labs and imaging studies  CBC Lab Results  Component Value Date   WBC 15.2 (H) 05/30/2020   RBC 3.72 (L) 05/30/2020   HGB 10.7 (L) 05/30/2020   HCT 33.9 (L) 05/30/2020   MCV 91.1 05/30/2020   MCH 28.8 05/30/2020   PLT 514 (H) 05/30/2020   MCHC 31.6 05/30/2020   RDW 13.4 05/30/2020   LYMPHSABS 2.6 05/26/2020   MONOABS 1.0 05/26/2020   EOSABS 0.6 (H) 05/26/2020   BASOSABS 0.1 41/63/8453     Last metabolic panel Lab Results  Component Value Date   NA 134 (L) 05/30/2020   K 4.4 05/30/2020   CL 103 05/30/2020   CO2 23 05/30/2020   BUN 16 05/30/2020   CREATININE 0.36 (L) 05/30/2020   GLUCOSE 236 (H) 05/30/2020   GFRNONAA >60 05/30/2020   GFRAA >60 12/10/2017   CALCIUM 8.7 (L) 05/30/2020   PHOS 2.6 02/13/2016   PROT 7.0 05/27/2020   ALBUMIN 2.6 (L) 05/27/2020   BILITOT 0.3 05/27/2020   ALKPHOS 95 05/27/2020    AST 25 05/27/2020   ALT 23 05/27/2020   ANIONGAP 8 05/30/2020    CBG (last 3)  Recent Labs    06/04/20 0429 06/04/20 0756 06/04/20 1200  GLUCAP 139* 171* 130*     GFR: Estimated Creatinine Clearance: 136.9 mL/min (A) (by C-G formula based on SCr of 0.36 mg/dL (L)).  Coagulation Profile: No results for input(s): INR, PROTIME in the last 168 hours.  No results found for this or any previous visit (from the past 240 hour(s)).      Radiology Studies: No results found.      Scheduled Meds: . ALPRAZolam  0.25 mg Per Tube BID  . vitamin C  500 mg Per Tube Daily  . baclofen  20 mg Per Tube QID  . buPROPion  75 mg Per Tube TID  . Chlorhexidine Gluconate Cloth  6 each Topical Daily  . cloNIDine  0.1 mg Per Tube TID  . collagenase   Topical Daily  . dantrolene  50 mg Per Tube 4 times per day  . feeding supplement (GLUCERNA 1.5 CAL)  1,280 mL Per Tube Q24H  . feeding supplement (PROSource TF)  45 mL Per Tube TID  . folic acid  1 mg Per Tube Daily  . free water  240 mL Per Tube Q4H  . Gerhardt's butt cream  Topical TID  . insulin aspart  0-15 Units Subcutaneous Q4H  . insulin detemir  22 Units Subcutaneous Daily  . levETIRAcetam  1,000 mg Per Tube TID  . multivitamin with minerals  1 tablet Per Tube Daily  . nutrition supplement (JUVEN)  1 packet Per Tube BID BM  . pantoprazole sodium  40 mg Per Tube Daily  . polyvinyl alcohol  1 drop Both Eyes BID  . vitamin B-12  500 mcg Per Tube Daily   Continuous Infusions: . sodium chloride    . ampicillin-sulbactam (UNASYN) IV 3 g (06/04/20 1344)  . DAPTOmycin (CUBICIN)  IV 650 mg (06/03/20 2016)     LOS: 17 days     Cordelia Poche, MD [facility] 06/04/2020, 2:45 PM  If 7PM-7AM, please contact night-coverage [email or web address]

## 2020-06-04 NOTE — Plan of Care (Signed)
  Problem: Education: Goal: Knowledge of General Education information will improve Description: Including pain rating scale, medication(s)/side effects and non-pharmacologic comfort measures Outcome: Progressing   Problem: Health Behavior/Discharge Planning: Goal: Ability to manage health-related needs will improve Outcome: Progressing   Problem: Clinical Measurements: Goal: Will remain free from infection Outcome: Progressing   Problem: Nutrition: Goal: Adequate nutrition will be maintained Outcome: Progressing   Problem: Elimination: Goal: Will not experience complications related to bowel motility Outcome: Progressing Goal: Will not experience complications related to urinary retention Outcome: Progressing   Problem: Pain Managment: Goal: General experience of comfort will improve Outcome: Progressing   Problem: Safety: Goal: Ability to remain free from injury will improve Outcome: Progressing   Problem: Skin Integrity: Goal: Risk for impaired skin integrity will decrease Outcome: Progressing

## 2020-06-05 DIAGNOSIS — A419 Sepsis, unspecified organism: Secondary | ICD-10-CM | POA: Diagnosis not present

## 2020-06-05 LAB — GLUCOSE, CAPILLARY
Glucose-Capillary: 136 mg/dL — ABNORMAL HIGH (ref 70–99)
Glucose-Capillary: 163 mg/dL — ABNORMAL HIGH (ref 70–99)

## 2020-06-05 MED ORDER — DIAZEPAM 5 MG PO TABS: 5.0000 mg | ORAL_TABLET | Freq: Every evening | ORAL | 0 refills | Status: AC | PRN

## 2020-06-05 MED ORDER — CLONIDINE HCL 0.1 MG PO TABS: 0.1000 mg | ORAL_TABLET | Freq: Three times a day (TID) | ORAL | 2 refills | Status: DC

## 2020-06-05 MED ORDER — JUVEN PO PACK: 1.0000 | PACK | Freq: Two times a day (BID) | ORAL | 0 refills | Status: DC

## 2020-06-05 MED ORDER — "INSULIN SYRINGE 31G X 5/16"" 0.3 ML MISC": 0 refills | Status: DC

## 2020-06-05 MED ORDER — PROSOURCE TF PO LIQD: 45.0000 mL | Freq: Three times a day (TID) | ORAL | 2 refills | Status: AC

## 2020-06-05 MED ORDER — GLUCERNA 1.5 CAL PO LIQD: 1280.0000 mL | ORAL | 2 refills | Status: AC

## 2020-06-05 MED ORDER — INSULIN DETEMIR 100 UNIT/ML ~~LOC~~ SOLN: 22.0000 [IU] | Freq: Every day | SUBCUTANEOUS | 2 refills | Status: DC

## 2020-06-05 MED ORDER — HEPARIN SOD (PORK) LOCK FLUSH 100 UNIT/ML IV SOLN
250.0000 [IU] | INTRAVENOUS | Status: DC | PRN
  Filled 2020-06-05: qty 2.5

## 2020-06-05 NOTE — Discharge Summary (Signed)
Physician Discharge Summary  Terry Palmer VZS:[id number] DOB: Jul 07, 1975 DOA: 05/18/2020  PCP: [name], MD  Admit date: 05/18/2020 Discharge date: 06/05/2020  Admitted From: Home Disposition: Home  Recommendations for Outpatient Follow-up:  1. Follow up with PCP in 1 week 2. Follow-up with infectious disease 3. Please obtain BMP/CBC in one week 4. Please follow up on the following pending results: None  Home Health: RN  Discharge Condition: Stable CODE STATUS: Full code Diet recommendation: Tube feeds   Brief/Interim Summary:  Admission HPI written by Shela Leff, MD  HPI: Terry Palmer is a 45 y.o. male with medical history significant of advanced multiple sclerosis, nonverbal at baseline, quadriplegic with contractures, neurogenic bladder with chronic suprapubic catheter, dysphagia with chronic indwelling PEG tube, dementia, history of seizures presented to the ED via EMS for evaluation of fevers and decubitus ulcer getting worse.  History provided by wife at bedside who reports that the patient has been tachycardic and having fevers for the past 2 days.  He has been receiving Tylenol at home.  Yesterday his heart rate was up to the 150s.  This morning his temperature was 101.2 F.  He has appeared more lethargic than his baseline.  Wife states he has a chronic sacral wound which has been worsening for since March with more drainage.  States patient is not vaccinated against COVID and has been coughing.  A week ago he had a chest x-ray done and was diagnosed with pneumonia.  He was started on amoxicillin and since then is having diarrhea.  No vomiting.  Wife states patient has a history of recurrent UTIs.   Hospital course:  Sepsis Present on admission.  Secondary to large sacral wound with osteomyelitis.  Blood cultures significant for 1 out of 4 positive for Staph hominis.  Management as mentioned below.  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   Associated necrotic tissue.  Infectious disease was consulted as mentioned above.  Patient was empirically treated with vancomycin, cefepime, azithromycin on admission and was transitioned to Unasyn with the addition of daptomycin per infectious recommendations.  Patient started on hydrotherapy via PT services. PICC line placed on 05/30/2020. ID recommendations: Daptomycin IV/Unasyn IV x6 weeks total. End date of 6/25  Positive blood culture Infectious disease was on board.  Blood cultures significant for 1 out of 4 Staph hominis.  Per infectious's recommendations, likely contaminant and are currently not treating.  Unlikely true bacteremia.  Patient received a transthoracic echo which was significant for no evidence of valvular vegetations.  Diabetes mellitus type 2 Hemoglobin A1c of 8.6%.  New diagnosis this admission.  Patient started on Levemir and sliding scale insulin.  Possibly exacerbated by ongoing infection. Continue Levemir 22 units daily on discharge.  Acute mental status change Unspecified.  Unsure of etiology.  Neurology was consulted.  EEG was obtained and was negative for seizure activity. Patient was transiently transferred to ICU, but did not require prolonged stay.  Hypokalemia Repletion as needed.  Diarrhea C. difficile and GI pathogen panel were negative.  Improved.  Multiple sclerosis Advanced.  Patient with functional quadriplegia, dysphagia currently PEG tube dependent, neurogenic bladder with chronic suprapubic catheter.  Palliative care was consulted on admission.  At this time, desire is for full scope, full code.  Primary hypertension Continue Clonidine  Dementia Noted on chart.  Secondary to multiple sclerosis. Continue Ativan with taper, Wellbutrin  Seizure disorder Continue Keppra  Pressure injury Left ischial tuberosity and sacral.  Present on admission.  Discharge Diagnoses:  Principal Problem:   Sepsis ([place]) Active Problems:   Multiple  sclerosis ([place])   UTI (urinary tract infection)   CAP (community acquired pneumonia)   Osteomyelitis ([place])   Pressure injury of skin    Discharge Instructions  Discharge Instructions    [facility] pharmacist to adjust dose for Vancomycin, Aminoglycosides and other anti-infective therapies as requested by physician.   Complete by: As directed    [facility] to provide Cath Flo 3109m   Complete by: As directed    Administer for PICC line occlusion and as ordered by physician for other access device issues.   Anaphylaxis Kit: Provided to treat any anaphylactic reaction to the medication being provided to the patient if First Dose or when requested by physician   Complete by: As directed    Epinephrine 136mml vial / amp: Administer 0.109m39m0.109ml15mubcutaneously once for moderate to severe anaphylaxis, nurse to call physician and pharmacy when reaction occurs and call 911 if needed for immediate care   Diphenhydramine 50mg34mIV vial: Administer 25-50mg 82mM PRN for first dose reaction, rash, itching, mild reaction, nurse to call physician and pharmacy when reaction occurs   Sodium Chloride 0.9% NS 500ml [id number] if needed for hypovolemic blood pressure drop or as ordered by physician after call to physician with anaphylactic reaction   Change dressing on IV access line weekly and PRN   Complete by: As directed    Flush IV access with Sodium Chloride 0.9% and Heparin 10 units/ml or 100 units/ml   Complete by: As directed    Home infusion instructions - [facility]   Complete by: As directed    Instructions: Flush IV access with Sodium Chloride 0.9% and Heparin 10units/ml or 100units/ml   Change dressing on IV access line: Weekly and PRN   Instructions Cath Flo 2mg: Ad35mister for PICC Line occlusion and as ordered by physician for other access device   [facility] pharmacist to adjust dose for: Vancomycin, Aminoglycosides and other  anti-infective therapies as requested by physician   Method of administration may be changed at the discretion of home infusion pharmacist based upon assessment of the patient and/or caregiver's ability to self-administer the medication ordered   Complete by: As directed      Allergies as of 06/05/2020   No Known Allergies     Medication List    TAKE these medications   Accu-Chek Guide test strip Generic drug: glucose blood Use to check blood sugar up to four times daily   Accu-Chek Guide w/Device Kit 1 each by Does not apply route as needed.   Accu-Chek Softclix Lancets lancets use as directed to check blood sugar up to 4 times daily   acetaminophen 500 MG tablet Commonly known as: TYLENOL Take 1 tablet (500 mg total) by mouth every 8 (eight) hours as needed for fever (or pain). What changed: when to take this   ALPRAZolam 0.25 MG tablet Commonly known as: Xanax From 5/26 to 5/30 Give 0.25mg per68me two times daily; then from 5/31 to 6/6 decrease to 0.25mg per 84m at bedtime. Then on 6/7 to 6/11, decrease to 0.25mg per t106mat bedtime every OTHER day. Then STOP   ampicillin-sulbactam  IVPB Commonly known as: UNASYN Inject 3 g into the vein every 6 (six) hours. Indication:  ischial osteomyelitis First Dose: Yes Last Day of Therapy:  07/02/20 Labs - Once weekly:  CBC/D and BMP, Labs - Every other week:  ESR  and CRP Method of administration: Mini-Bag Plus / Gravity Method of administration may be changed at the discretion of home infusion pharmacist based upon assessment of the patient and/or caregiver's ability to self-administer the medication ordered.   AZO-CRANBERRY PO Take 1 tablet by mouth 2 (two) times daily.   baclofen 20 MG tablet Commonly known as: LIORESAL Place 1 tablet (20 mg total) into feeding tube 4 (four) times daily.   bisacodyl 10 MG suppository Commonly known as: DULCOLAX Place 10 mg rectally once as needed for moderate constipation.   buPROPion  75 MG tablet Commonly known as: WELLBUTRIN Place 1 tablet (75 mg total) into feeding tube 3 (three) times daily.   cloNIDine 0.1 MG tablet Commonly known as: CATAPRES Place 1 tablet (0.1 mg total) into feeding tube 3 (three) times daily.   dantrolene 50 MG capsule Commonly known as: DANTRIUM Give 50 mg by tube 4 (four) times daily. At 0800, 1200, 1600, 2000   daptomycin  IVPB Commonly known as: CUBICIN Inject 650 mg into the vein daily. Indication:  ischial osteomyelitis First Dose: Yes Last Day of Therapy:  07/02/20 Labs - Once weekly:  CBC/D, BMP, and CPK Labs - Every other week:  ESR and CRP Method of administration: IV Push Method of administration may be changed at the discretion of home infusion pharmacist based upon assessment of the patient and/or caregiver's ability to self-administer the medication ordered.   diazepam 5 MG tablet Commonly known as: VALIUM Place 1 tablet (5 mg total) into feeding tube at bedtime as needed for anxiety (sleep). Hold until completion of Ativan What changed: additional instructions   famotidine 40 MG/5ML suspension Commonly known as: PEPCID Take 20 mg by mouth daily.   feeding supplement (PROSource TF) liquid Place 45 mLs into feeding tube 3 (three) times daily. What changed:   how much to take  when to take this  additional instructions   nutrition supplement (JUVEN) Pack Place 1 packet into feeding tube 2 (two) times daily between meals. What changed: You were already taking a medication with the same name, and this prescription was added. Make sure you understand how and when to take each.   feeding supplement (GLUCERNA 1.5 CAL) Liqd Place 1,280 mLs into feeding tube daily. Start taking on: Jun 06, 2020 What changed: You were already taking a medication with the same name, and this prescription was added. Make sure you understand how and when to take each.   free water Soln Place 240 mLs into feeding tube every 4 (four)  hours.   guaifenesin 100 MG/5ML syrup Commonly known as: ROBITUSSIN Take 200 mg by mouth 3 (three) times daily as needed for cough.   HYDROcodone-acetaminophen 7.5-325 MG tablet Commonly known as: [place] 1 tablet into feeding tube every 6 (six) hours as needed (pain).   insulin detemir 100 UNIT/ML injection Commonly known as: LEVEMIR Inject 0.22 mLs (22 Units total) into the skin daily. Start taking on: Jun 06, 2020   INSULIN SYRINGE .3CC/31GX5/16" 31G X 5/16" 0.3 ML Misc Use with Levemir prescription.   ipratropium-albuterol 0.5-2.5 (3) MG/3ML Soln Commonly known as: DUONEB Take 3 mLs by nebulization See admin instructions. Four times daily and as needed for shortness of breath   levETIRAcetam 100 MG/ML solution Commonly known as: KEPPRA Take 1,200 mg by mouth in the morning, at noon, and at bedtime. Wife gives differently:  67m TID (= 10068mper dose)   multivitamin with minerals Tabs tablet Place 1 tablet into feeding tube daily.  omeprazole 2 mg/mL Susp oral suspension Commonly known as: FIRST-Omeprazole Place 20 mLs (40 mg total) into feeding tube daily.   Opurity B12/Folic Acid 8841-660 MCG Tabs Take 0.5 tablets by mouth daily.   polyethylene glycol 17 g packet Commonly known as: MIRALAX / GLYCOLAX Place 17 g into feeding tube daily. What changed: when to take this   polyvinyl alcohol 1.4 % ophthalmic solution Commonly known as: LIQUIFILM TEARS Place 1 drop into both eyes 3 (three) times daily. What changed: when to take this   tiZANidine 2 MG tablet Commonly known as: ZANAFLEX Take 1 tablet (2 mg total) by mouth every 6 (six) hours as needed for muscle spasms. What changed:   how to take this  when to take this   vitamin C 500 MG tablet Commonly known as: ASCORBIC ACID Place 500 mg into feeding tube daily.            Discharge Care Instructions  (From admission, onward)         Start     Ordered   06/02/20 0000  Change dressing on IV  access line weekly and PRN  (Home infusion instructions - [facility] )        06/02/20 1518          Follow-up Information    [name], MD. Schedule an appointment as soon as possible for a visit in 1 week(s).   Specialty: Family Medicine Contact information: [place] [address] [phone]              No Known Allergies  Consultations:  Infectious disease  Critical care  Palliative care  General surgery     Procedures/Studies: CT ABDOMEN PELVIS WO CONTRAST  Result Date: 05/18/2020 CLINICAL DATA:  Abdominal pain and ischial tuberosity wound, initial encounter EXAM: CT ABDOMEN AND PELVIS WITHOUT CONTRAST TECHNIQUE: Multidetector CT imaging of the abdomen and pelvis was performed following the standard protocol without IV contrast. COMPARISON:  12/08/2017 FINDINGS: Lower chest: Lung bases demonstrate mild scarring bilaterally stable from the prior exam. Hepatobiliary: Liver is within normal limits. Gallbladder is well distended with dependent density consistent with small stones. Pancreas: Unremarkable. No pancreatic ductal dilatation or surrounding inflammatory changes. Spleen: Normal in size without focal abnormality. Adrenals/Urinary Tract: Adrenal glands are within normal limits. Kidneys demonstrate no renal calculi or urinary tract obstructive changes. The ureters are within normal limits. The bladder is decompressed by a suprapubic catheter. Stomach/Bowel: Colon shows no obstructive or inflammatory changes. Mild retained fecal material is seen although no obstructive changes are noted. The appendix is within normal limits. Small bowel and stomach are within normal limits with the exception of a gastrostomy catheter. Vascular/Lymphatic: Aortic atherosclerosis. No enlarged abdominal or pelvic lymph nodes. Reproductive: Prostate is unremarkable. Other: No abdominal wall hernia or abnormality. No abdominopelvic ascites. Musculoskeletal:  Degenerative changes of lumbar spine are noted with scoliosis concave to the right. Sacrum is within normal limits. There is a soft tissue wound overlying the left ischial tuberosity posteriorly. This is consistent with a decubitus wound. Erosive changes of the underlying ischial tuberosity are seen posteriorly consistent with osteomyelitis. Chronic dislocation of the hips is noted bilaterally. IMPRESSION: Significant progression in soft tissue wound overlying the left ischial tuberosity with underlying bony erosive changes consistent with osteomyelitis. Cholelithiasis without complicating factors. 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 Electronically Signed   By: Inez Catalina M.D.   On: 05/18/2020 19:28   DG ABDOMEN PEG TUBE LOCATION  Result Date: 05/24/2020 CLINICAL DATA:  History of G-tube malfunction. 30 mL of Gastrografin was injected through the G-tube. EXAM: ABDOMEN - 1 VIEW COMPARISON:  11/02/2015 and CT abdomen 05/18/2020 FINDINGS: Single view of the abdomen demonstrates contrast within the stomach predominantly at the gastric fundus. Nonobstructive bowel gas pattern. No evidence for contrast outside of the stomach. IMPRESSION: G-tube appears to be positioned within the stomach. Electronically Signed   By: Markus Daft M.D.   On: 05/24/2020 12:25   DG CHEST PORT 1 VIEW  Result Date: 05/22/2020 CLINICAL DATA:  Hypoxia. EXAM: PORTABLE CHEST 1 VIEW COMPARISON:  05/18/2020 FINDINGS: Lung volumes are low. Heart size and mediastinal contours are unremarkable. Atelectasis is identified within the left base. No pleural effusion or interstitial edema identified. No airspace consolidation. IMPRESSION: Low lung volumes and left base atelectasis. Electronically Signed   By: Kerby Moors M.D.   On: 05/22/2020 05:22   DG Chest Port 1 View  Result Date: 05/18/2020 CLINICAL DATA:  Unresponsive.  Seizure disorder EXAM: PORTABLE CHEST 1 VIEW COMPARISON:  December 07, 2017 FINDINGS: There is  ill-defined airspace opacity in the left base. The lungs elsewhere are clear. The heart size and pulmonary vascularity are normal. No adenopathy. No evident bone lesions. IMPRESSION: Ill-defined airspace opacity consistent with pneumonia left base. Lungs otherwise clear. Cardiac silhouette normal. Electronically Signed   By: Lowella Grip III M.D.   On: 05/18/2020 18:34   EEG adult  Result Date: 05/22/2020 Lora Havens, MD     05/22/2020  2:08 PM Patient Name: Terry Palmer MRN: [id number] Epilepsy Attending: Lora Havens Referring Physician/Provider: Dr Irene Pap Date: 05/22/2020 Duration: 23.45 mins Patient history: 45yo M with h/o seizures, worsening mentation. EEG to evaluate for seizure Level of alertness:  lethargic AEDs during EEG study: LEV Technical aspects: This EEG study was done with scalp electrodes positioned according to the 10-20 International system of electrode placement. Electrical activity was acquired at a sampling rate of 500Hz  and reviewed with a high frequency filter of 70Hz  and a low frequency filter of 1Hz . EEG data were recorded continuously and digitally stored. Description: No posterior dominant rhythm was seen. EEG showed continuous generalized 3 to 6 Hz theta-delta slowing. Hyperventilation and photic stimulation were not performed.   ABNORMALITY - Continuous slow, generalized IMPRESSION: This study is suggestive of moderate diffuse encephalopathy, nonspecific etiology. No seizures or epileptiform discharges were seen throughout the recording. Lora Havens   ECHOCARDIOGRAM COMPLETE  Result Date: 05/20/2020    ECHOCARDIOGRAM REPORT   Patient Name:   Terry Palmer Date of Exam: 05/20/2020 Medical Rec #:  [id number]        Height:       76.0 in Accession #:    [id number]       Weight:       185.0 lb Date of Birth:  1975-06-23       BSA:          2.143 m Patient Age:    69 years         BP:           142/91 mmHg Patient Gender: M                HR:            119 bpm. Exam Location:  Inpatient Procedure: 2D Echo Indications:    Ventricular Tachycardia  History:        Patient has prior history of Echocardiogram examinations, most  recent 08/09/2005. Multiple sclerosis. Sepsis.  Sonographer:    Johny Chess Referring Phys: [id number] RAVI Valencia  1. Left ventricular ejection fraction, by estimation, is 55%. The left ventricle has normal function. The left ventricle has no regional wall motion abnormalities. Left ventricular diastolic parameters are consistent with Grade I diastolic dysfunction (impaired relaxation).  2. Right ventricular systolic function is normal. The right ventricular size is normal. Tricuspid regurgitation signal is inadequate for assessing PA pressure.  3. The mitral valve is normal in structure. No evidence of mitral valve regurgitation. No evidence of mitral stenosis.  4. The aortic valve is tricuspid. Aortic valve regurgitation is not visualized. No aortic stenosis is present.  5. The IVC was not visualized. FINDINGS  Left Ventricle: Left ventricular ejection fraction, by estimation, is 55%. The left ventricle has normal function. The left ventricle has no regional wall motion abnormalities. The left ventricular internal cavity size was normal in size. There is no left ventricular hypertrophy. Left ventricular diastolic parameters are consistent with Grade I diastolic dysfunction (impaired relaxation). Right Ventricle: The right ventricular size is normal. No increase in right ventricular wall thickness. Right ventricular systolic function is normal. Tricuspid regurgitation signal is inadequate for assessing PA pressure. Left Atrium: Left atrial size was normal in size. Right Atrium: Right atrial size was normal in size. Pericardium: There is no evidence of pericardial effusion. Mitral Valve: The mitral valve is normal in structure. No evidence of mitral valve regurgitation. No evidence of mitral valve stenosis.  Tricuspid Valve: The tricuspid valve is normal in structure. Tricuspid valve regurgitation is not demonstrated. Aortic Valve: The aortic valve is tricuspid. Aortic valve regurgitation is not visualized. No aortic stenosis is present. Pulmonic Valve: The pulmonic valve was normal in structure. Pulmonic valve regurgitation is not visualized. Aorta: The aortic root is normal in size and structure. Venous: The inferior vena cava was not well visualized. IAS/Shunts: No atrial level shunt detected by color flow Doppler.  LEFT VENTRICLE PLAX 2D LVIDd:         4.80 cm     Diastology LVIDs:         3.30 cm     LV e' medial: 7.62 cm/s LV PW:         0.90 cm LV IVS:        0.80 cm LVOT diam:     2.50 cm LV SV:         67 LV SV Index:   31 LVOT Area:     4.91 cm  LV Volumes (MOD) LV vol d, MOD A2C: 85.2 ml LV vol d, MOD A4C: 80.3 ml LV vol s, MOD A2C: 48.6 ml LV vol s, MOD A4C: 45.8 ml LV SV MOD A2C:     36.6 ml LV SV MOD A4C:     80.3 ml LV SV MOD BP:      36.9 ml RIGHT VENTRICLE RV S prime:     18.20 cm/s TAPSE (M-mode): 1.7 cm LEFT ATRIUM           Index       RIGHT ATRIUM          Index LA diam:      2.30 cm 1.07 cm/m  RA Area:     9.01 cm LA Vol (A2C): 33.5 ml 15.63 ml/m RA Volume:   14.50 ml 6.77 ml/m LA Vol (A4C): 31.2 ml 14.56 ml/m  AORTIC VALVE LVOT Vmax:   91.40 cm/s LVOT Vmean:  58.900 cm/s LVOT VTI:  0.137 m  AORTA Ao Root diam: 3.10 cm  SHUNTS Systemic VTI:  0.14 m Systemic Diam: 2.50 cm Loralie Champagne MD Electronically signed by Loralie Champagne MD Signature Date/Time: 05/20/2020/4:18:51 PM    Final    IR PICC PLACEMENT RIGHT >5 YRS INC IMG GUIDE  Result Date: 05/30/2020 INDICATION: 45 year old male with history of osteomyelitis requiring central venous access for long-term antibiotics. EXAM: ULTRASOUND AND FLUOROSCOPIC GUIDED PICC LINE INSERTION MEDICATIONS: None. CONTRAST:  None FLUOROSCOPY TIME:  Six seconds (1 mGy) COMPLICATIONS: None immediate. TECHNIQUE: The procedure, risks, benefits, and  alternatives were explained to the patient and informed written consent was obtained. A timeout was performed prior to the initiation of the procedure. The right upper extremity was prepped with chlorhexidine in a sterile fashion, and a sterile drape was applied covering the operative field. Maximum barrier sterile technique with sterile gowns and gloves were used for the procedure. A timeout was performed prior to the initiation of the procedure. Local anesthesia was provided with 1% lidocaine. Under direct ultrasound guidance, the basilic vein was accessed with a micropuncture kit after the overlying soft tissues were anesthetized with 1% lidocaine. After the overlying soft tissues were anesthetized, a small venotomy incision was created and a micropuncture kit was utilized to access the right basilic vein. Real-time ultrasound guidance was utilized for vascular access including the acquisition of a permanent ultrasound image documenting patency of the accessed vessel. A guidewire was advanced to the level of the superior caval-atrial junction for measurement purposes and the PICC line was cut to length. A peel-away sheath was placed and a 40 cm, 5 [place], dual lumen was inserted to level of the superior caval-atrial junction. A post procedure spot fluoroscopic was obtained. The catheter easily aspirated and flushed and was secured in place with stat lock device. A dressing was applied. The patient tolerated the procedure well without immediate post procedural complication. FINDINGS: After catheter placement, the tip lies within the superior cavoatrial junction. The catheter aspirates and flushes normally and is ready for immediate use. IMPRESSION: Successful ultrasound and fluoroscopic guided placement of a right basilic vein approach, 40 cm, 5 French, dual lumen PICC with tip at the superior caval-atrial junction. The PICC line is ready for immediate use. Ruthann Cancer, MD Vascular and Interventional Radiology  [facility] Electronically Signed   By: Ruthann Cancer MD   On: 05/30/2020 11:35   [place] EKG SITE RITE  Result Date: 05/24/2020 If Site Rite image not attached, placement could not be confirmed due to current cardiac rhythm.    Subjective: Non-verbal  Discharge Exam: Vitals:   06/05/20 0348 06/05/20 0745  BP: 123/78 125/80  Pulse: 100 (!) 103  Resp: 18 19  Temp: 98.6 F (37 C) 98.2 F (36.8 C)  SpO2: 100% 100%   Vitals:   06/04/20 1938 06/05/20 0133 06/05/20 0348 06/05/20 0745  BP: 116/67 106/74 123/78 125/80  Pulse: 99 97 100 (!) 103  Resp: 18 18 18 19   Temp: 98.6 F (37 C) 98.3 F (36.8 C) 98.6 F (37 C) 98.2 F (36.8 C)  TempSrc: Oral Oral Oral Oral  SpO2: 100% 100% 100% 100%  Weight:      Height:        General: Pt is resting. No distress Cardiovascular: RRR, S1/S2 +, no rubs, no gallops Respiratory: CTA bilaterally, no wheezing, no rhonchi Abdominal: Soft, NT, ND, bowel sounds + Extremities: multiple contractures    The results of significant diagnostics from this hospitalization (including imaging, microbiology,  ancillary and laboratory) are listed below for reference.     Labs: Basic Metabolic Panel: Recent Labs  Lab 05/30/20 0143  NA 134*  K 4.4  CL 103  CO2 23  GLUCOSE 236*  BUN 16  CREATININE 0.36*  CALCIUM 8.7*   CBC: Recent Labs  Lab 05/30/20 0143  WBC 15.2*  HGB 10.7*  HCT 33.9*  MCV 91.1  PLT 514*   Cardiac Enzymes: Recent Labs  Lab 06/03/20 0141  CKTOTAL 98   CBG: Recent Labs  Lab 06/04/20 1701 06/04/20 1936 06/04/20 2349 06/05/20 0346 06/05/20 0744  GLUCAP 151* 145* 170* 136* 163*   Urinalysis    Component Value Date/Time   COLORURINE YELLOW 05/18/2020 1824   APPEARANCEUR CLOUDY (A) 05/18/2020 1824   LABSPEC 1.009 05/18/2020 1824   PHURINE 7.0 05/18/2020 1824   GLUCOSEU NEGATIVE 05/18/2020 1824   HGBUR NEGATIVE 05/18/2020 1824   BILIRUBINUR NEGATIVE 05/18/2020 1824   KETONESUR NEGATIVE  05/18/2020 1824   PROTEINUR 30 (A) 05/18/2020 1824   UROBILINOGEN 0.2 10/20/2014 1328   NITRITE NEGATIVE 05/18/2020 1824   LEUKOCYTESUR LARGE (A) 05/18/2020 1824    Time coordinating discharge: 35 minutes  SIGNED:   Cordelia Poche, MD [facility] 06/05/2020, 11:35 AM

## 2020-06-05 NOTE — Progress Notes (Signed)
Pt discharged home via ptar. Condition stable for discharge

## 2020-06-05 NOTE — TOC Transition Note (Signed)
Transition of Care [facility]) - CM/SW Discharge Note   Patient Details  Name: Terry Palmer MRN: [id number] Date of Birth: 1975/06/28  [facility] CM/SW Contact:  Bartholomew Crews, RN Phone Number: [phone] 06/05/2020, 9:55 AM   Clinical Narrative:     DC order written. Notified MD of DC summary needing to be faxed to [facility].   Spoke with Vonna Kotyk at [place] to confirm readiness for discharge home.   Spoke with Pam at [employer] [facility] to confirm IV antibiotic needs in place.   Spoke with [name] (patient's spouse) - she is ready to receive patient at home.   Nursing ready for transport to be arranged. PTAR notified. Medical transport paperwork completed - printed to 6N nurses station.   No further TOC needs identified at this time.  Final next level of care: [place] Barriers to Discharge: No Barriers Identified   Patient Goals and CMS Choice Patient states their goals for this hospitalization and ongoing recovery are:: return home CMS Medicare.gov Compare Post Acute Care list provided to:: Patient Represenative (must comment) Choice offered to / list presented to : Spouse  Discharge Placement                       Discharge Plan and Services   Discharge Planning Services: CM Consult Post Acute Care Choice: Home Health          DME Arranged: N/A DME Agency: NA       HH Arranged: RN   Date HH Agency Contacted: 06/05/20 Time [place]: [phone] Representative spoke with at [place]: Domenic Schwab at [place] Determinants of Health ([place]) Interventions     Readmission Risk Interventions No flowsheet data found.

## 2020-07-15 ENCOUNTER — Emergency Department (HOSPITAL_COMMUNITY): Payer: Medicare HMO

## 2020-07-15 ENCOUNTER — Emergency Department (HOSPITAL_COMMUNITY)
Admission: EM | Admit: 2020-07-15 | Discharge: 2020-07-16 | Disposition: A | Discharge status: Home / Self Care | Payer: Medicare HMO | Attending: Emergency Medicine | Admitting: Emergency Medicine

## 2020-07-15 DIAGNOSIS — K9423 Gastrostomy malfunction: Secondary | ICD-10-CM | POA: Insufficient documentation

## 2020-07-15 DIAGNOSIS — N390 Urinary tract infection, site not specified: Secondary | ICD-10-CM | POA: Diagnosis not present

## 2020-07-15 DIAGNOSIS — Y848 Other medical procedures as the cause of abnormal reaction of the patient, or of later complication, without mention of misadventure at the time of the procedure: Secondary | ICD-10-CM | POA: Diagnosis not present

## 2020-07-15 DIAGNOSIS — T85528A Displacement of other gastrointestinal prosthetic devices, implants and grafts, initial encounter: Secondary | ICD-10-CM

## 2020-07-15 DIAGNOSIS — T83098A Other mechanical complication of other indwelling urethral catheter, initial encounter: Secondary | ICD-10-CM | POA: Insufficient documentation

## 2020-07-15 DIAGNOSIS — Z794 Long term (current) use of insulin: Secondary | ICD-10-CM | POA: Insufficient documentation

## 2020-07-15 DIAGNOSIS — J45909 Unspecified asthma, uncomplicated: Secondary | ICD-10-CM | POA: Insufficient documentation

## 2020-07-15 DIAGNOSIS — F0390 Unspecified dementia without behavioral disturbance: Secondary | ICD-10-CM | POA: Insufficient documentation

## 2020-07-15 DIAGNOSIS — Z87891 Personal history of nicotine dependence: Secondary | ICD-10-CM | POA: Insufficient documentation

## 2020-07-15 DIAGNOSIS — Z20822 Contact with and (suspected) exposure to covid-19: Secondary | ICD-10-CM | POA: Diagnosis not present

## 2020-07-15 DIAGNOSIS — Z431 Encounter for attention to gastrostomy: Secondary | ICD-10-CM

## 2020-07-15 DIAGNOSIS — R52 Pain, unspecified: Secondary | ICD-10-CM

## 2020-07-15 DIAGNOSIS — T839XXA Unspecified complication of genitourinary prosthetic device, implant and graft, initial encounter: Secondary | ICD-10-CM

## 2020-07-15 MED ORDER — DIATRIZOATE MEGLUMINE & SODIUM 66-10 % PO SOLN
ORAL | Status: AC
  Filled 2020-07-15: qty 30

## 2020-07-15 MED ORDER — DIATRIZOATE MEGLUMINE & SODIUM 66-10 % PO SOLN
30.0000 mL | Freq: Once | ORAL | Status: AC
  Administered 2020-07-15: 30 mL
  Filled 2020-07-15: qty 30

## 2020-07-15 NOTE — ED Notes (Signed)
Assume care of the pt, on assessment noticed that pt's HR up on the 120 bpm, when reviewing the chart pt is been with that HR since early afternoon at 13:00 pt is up for dc awaiting for PTAR, ED providers notified for further assessment.

## 2020-07-15 NOTE — ED Notes (Signed)
PTAR called, 28 ahead

## 2020-07-15 NOTE — ED Triage Notes (Signed)
Pt via EMS from home after g tube came out 24 hours ago. Pt also has not had any urine output from his suprapubic catheter in 16 hours. Hx of MS. Caregiver reports normal mental status.

## 2020-07-15 NOTE — ED Provider Notes (Signed)
[place] EMERGENCY DEPARTMENT Provider Note   CSN: [id number] Arrival date & time: 07/15/20  1305     History No chief complaint on file.   Terry Palmer is a 45 y.o. male.  HPI Patient sent to the emergency department for placement of gastrostomy tube.  Tube size not included in description.  This apparently came out 24 hours ago.  Also report of no urine output from suprapubic catheter.  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   Recurrent upper respiratory infection (URI)    Recurrent UTI    Seizure disorder ([place])    Shortness of breath     Patient Active Problem List   Diagnosis Date Noted   Pressure injury of skin 05/20/2020   CAP (community acquired pneumonia) 05/18/2020   Osteomyelitis ([place]) 05/18/2020   Goals of care, counseling/discussion    Palliative care by specialist    DNR (do not resuscitate) discussion    Palliative care encounter    Acute metabolic encephalopathy 72/53/6644   Pneumonia due to infectious organism    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   Lactic acidosis 04/27/2016   Acute cystitis with hematuria    Suprapubic catheter ([place])    G tube feedings ([place])    Flexion contractures    Encephalopathy    Bacteremia 05/27/2015   Sepsis ([place]) 05/25/2015   Altered mental state 05/25/2015   Altered mental status 05/25/2015   Fever, unspecified 03/04/2015   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   Dementia due to  multiple sclerosis ([place]) 12/24/2014   Fever    Hyperthermia, 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   Protein-calorie malnutrition, severe ([place]) 10/21/2014   UTI (urinary tract infection) 10/20/2014   Sepsis secondary to UTI ([place]) 10/20/2014   Leukocytosis 10/20/2014   Acute respiratory failure with hypoxia ([place]) 10/20/2014   Seizure disorder ([place]) 10/20/2014   Aspiration pneumonitis ([place]) 10/20/2014   Neurogenic bladder 05/28/2011   Multiple sclerosis ([place]) 04/20/2011   FTT (failure to thrive) in adult 04/20/2011   Sacral decubitus ulcer, stage II ([place]) 04/20/2011   Quadriparesis (muscle weakness) 03/12/2011    Past Surgical History:  Procedure Laterality Date   GASTROSTOMY  04/16/2011   Procedure: GASTROSTOMY;  Surgeon: Zenovia Jarred, MD;  Location: [facility];  Service: General;  Laterality: N/A;  Open G-Tube placement   LUMBAR PUNCTURE  10/12/2002   SUPRAPUBIC CYSTOSTOMY         Family History  Problem Relation Age of Onset   Asthma Mother     Social History   Tobacco Use   Smoking status: Former    Pack years: 0.00    Types: Cigarettes    Quit date: 02/02/1999    Years since quitting: 21.4   Smokeless tobacco: Former  Substance Use Topics   Alcohol use: No    Home Medications Prior to Admission medications   Medication Sig Start Date End Date Taking? Authorizing Provider  Accu-Chek Softclix Lancets lancets use as directed to check blood sugar  up to 4 times daily 06/02/20   Mariel Aloe, MD  acetaminophen (TYLENOL) 500 MG tablet Take 1 tablet (500 mg total) by mouth every 8 (eight) hours as needed for fever (or pain). Patient taking differently: Take 500 mg by mouth 2 (two) times daily. 03/05/15   Debbe Odea, MD  ALPRAZolam Duanne Moron) 0.25 MG tablet From 5/26 to 5/30 Give 0.36m per tube two times daily; then from 5/31 to 6/6 decrease to 0.260mper tube at bedtime. Then on 6/7 to 6/11, decrease to 0.255mer tube at bedtime every OTHER day. Then STOP 06/02/20   NetMariel AloeD   AZO-CRANBERRY PO Take 1 tablet by mouth 2 (two) times daily.    [provider]  baclofen (LIORESAL) 20 MG tablet Place 1 tablet (20 mg total) into feeding tube 4 (four) times daily. 07/13/15   WilKathrynn DuckingD  bisacodyl (DULCOLAX) 10 MG suppository Place 10 mg rectally once as needed for moderate constipation.    [provider]  Blood Glucose Monitoring Suppl (ACCU-CHEK GUIDE) w/Device KIT 1 each by Does not apply route as needed. 06/02/20   NetMariel AloeD  buPROPion (WELLBUTRIN) 75 MG tablet Place 1 tablet (75 mg total) into feeding tube 3 (three) times daily. 01/24/13   KriBonnielee HaffD  cloNIDine (CATAPRES) 0.1 MG tablet Place 1 tablet (0.1 mg total) into feeding tube 3 (three) times daily. 06/05/20 09/03/20  NetMariel AloeD  Cobalamin Combinations (OPURITY B12/FOLIC ACID) 1007867-672G TABS Take 0.5 tablets by mouth daily.    [provider]  dantrolene (DANTRIUM) 50 MG capsule Give 50 mg by tube 4 (four) times daily. At 0800, 1200, 1600, 2000    [provider]  diazepam (VALIUM) 5 MG tablet Place 1 tablet (5 mg total) into feeding tube at bedtime as needed for anxiety (sleep). Hold until completion of Ativan 06/05/20   NetMariel AloeD  famotidine (PEPCID) 40 MG/5ML suspension Take 20 mg by mouth daily. 04/28/20   [provider]  glucose blood (ACCU-CHEK GUIDE) test strip Use to check blood sugar up to four times daily 06/02/20   NetMariel AloeD  guaifenesin (ROBITUSSIN) 100 MG/5ML syrup Take 200 mg by mouth 3 (three) times daily as needed for cough.    [provider]  HYDROcodone-acetaminophen (NORCO) 7.5-325 MG per tablet Place 1 tablet into feeding tube every 6 (six) hours as needed (pain).     [provider]  insulin detemir (LEVEMIR) 100 UNIT/ML injection Inject 0.22 mLs (22 Units total) into the skin daily. 06/06/20 09/04/20  NetMariel AloeD  Insulin Syringe-Needle U-100 (INSULIN SYRINGE .3CC/31GX5/16")  31G X 5/16" 0.3 ML MISC Use with Levemir prescription. 06/05/20   NetMariel AloeD  ipratropium-albuterol (DUONEB) 0.5-2.5 (3) MG/3ML SOLN Take 3 mLs by nebulization See admin instructions. Four times daily and as needed for shortness of breath    [provider]  levETIRAcetam (KEPPRA) 100 MG/ML solution Take 1,200 mg by mouth in the morning, at noon, and at bedtime. Wife gives differently:  37m22mD (= 1000mg28m dose)    [provider]  Multiple Vitamin (MULTIVITAMIN WITH MINERALS) TABS tablet Place 1 tablet into feeding tube daily. 01/24/13   KrishBonnielee Haff nutrition supplement, JUVEN, (JUVEFanny DanceK Place 1 packet into feeding tube 2 (two) times daily between meals. 06/05/20 09/03/20  NetteMariel Aloe Nutritional Supplements (FEEDING SUPPLEMENT, GLUCERNA 1.5 CAL,) LIQD Place 1,280 mLs into feeding  tube daily. 06/06/20 09/04/20  Mariel Aloe, MD  Nutritional Supplements (FEEDING SUPPLEMENT, PROSOURCE TF,) liquid Place 45 mLs into feeding tube 3 (three) times daily. 06/05/20 09/03/20  Mariel Aloe, MD  omeprazole (PRILOSEC) 2 mg/mL SUSP Place 20 mLs (40 mg total) into feeding tube daily. 01/24/13   Bonnielee Haff, MD  polyethylene glycol [facility] / Floria Raveling) packet Place 17 g into feeding tube daily. Patient taking differently: Place 17 g into feeding tube every evening. 01/24/13   Bonnielee Haff, MD  polyvinyl alcohol (LIQUIFILM TEARS) 1.4 % ophthalmic solution Place 1 drop into both eyes 3 (three) times daily. Patient taking differently: Place 1 drop into both eyes 2 (two) times daily. 01/24/13   Bonnielee Haff, MD  tiZANidine (ZANAFLEX) 2 MG tablet Take 1 tablet (2 mg total) by mouth every 6 (six) hours as needed for muscle spasms. Patient taking differently: Place 2 mg into feeding tube 3 (three) times daily. 11/25/15   Kathrynn Ducking, MD  vitamin C (ASCORBIC ACID) 500 MG tablet Place 500 mg into feeding tube daily.     [provider]  Water For  Irrigation, Sterile (FREE WATER) SOLN Place 240 mLs into feeding tube every 4 (four) hours. 01/24/13   Bonnielee Haff, MD    Allergies    Patient has no known allergies.  Review of Systems   Review of Systems Level 5 caveat cannot obtain review of systems due to patient condition, nonverbal. Physical Exam Updated Vital Signs BP (!) 145/96   Pulse (!) 128   Temp 98.3 F (36.8 C) (Axillary) Comment: Simultaneous filing. User may not have seen previous data.  Resp (!) 21   SpO2 98%   Physical Exam Constitutional:      Comments: Patient is alert.  He is gazing about.  HENT:     Mouth/Throat:     Pharynx: Oropharynx is clear.  Cardiovascular:     Comments: Borderline tachycardia  Pulmonary:     Comments: No respiratory distress.  Anterior auscultation grossly clear Abdominal:     Comments: Abdomen soft nondistended.  Gastrostomy site has frothy stomach secretions periodically extruding  Musculoskeletal:     Comments: Patient has severe contractures of both upper and lower extremities.  Skin:    General: Skin is warm and dry.  Neurological:     Comments: Patient is nonverbal.  He cannot follow any commands.  Severe upper and lower extremity contractures.  Psychiatric:     Comments: Patient is calm.  He is awake.  Eyes are gazing about    ED Results / Procedures / Treatments   Labs (all labs ordered are listed, but only abnormal results are displayed) Labs Reviewed - No data to display  EKG None  Radiology DG ABDOMEN PEG TUBE LOCATION  Result Date: 07/15/2020 CLINICAL DATA:  Status post replacement of PEG tube. EXAM: ABDOMEN - 1 VIEW COMPARISON:  In single-view of the abdomen 05/24/2020. FINDINGS: Contrast injection of the patient's PEG tube opacifies the stomach. IMPRESSION: Peg tube in good position. Electronically Signed   By: Inge Rise M.D.   On: 07/15/2020 15:03    Procedures Gastrostomy tube replacement  Date/Time: 07/15/2020 2:11 PM Performed by: Charlesetta Shanks, MD Authorized by: Charlesetta Shanks, MD  Patient identity confirmed: arm band Preparation: Patient was prepped and draped in the usual sterile fashion. Local anesthesia used: no  Anesthesia: Local anesthesia used: no  Sedation: Patient sedated: no  Patient tolerance: patient tolerated the procedure well with no immediate complications Comments: Patient  has well-established gastrostomy site.  Patient had bubbly gastric secretions coming from the site.  This was cleaned and prepped.  18 French angled G-tube placed without difficulty.  Balloon inflated to 6 mL and bolster positioned.  Patient tolerated well.   BLADDER CATHETERIZATION  Date/Time: 07/15/2020 4:17 PM Performed by: Charlesetta Shanks, MD Authorized by: Charlesetta Shanks, MD   Consent:    Consent obtained:  Verbal   Consent given by:  Guardian Pre-procedure details:    Procedure purpose:  Therapeutic   Preparation: Patient was prepped and draped in usual sterile fashion   Anesthesia:    Anesthesia method:  Topical application Procedure details:    Catheter type:  Coude   Number of attempts:  1   Urine characteristics:  Clear Post-procedure details:    Procedure completion:  Tolerated well, no immediate complications Comments:     Suprapubic catheter changed.  Catheter was occluded and could not be flushed.  Area was prepped with Betadine and a 22 [place] coud passed without difficulty.  Return of clear urine.   Medications Ordered in ED Medications  diatrizoate meglumine-sodium (GASTROGRAFIN) 66-10 % solution 30 mL (30 mLs Per Tube Given 07/15/20 1429)    ED Course  I have reviewed the triage vital signs and the nursing notes.  Pertinent labs & imaging results that were available during my care of the patient were reviewed by me and considered in my medical decision making (see chart for details).    MDM Rules/Calculators/A&P                          Patient sent with history of gastrostomy tube pulled out  greater 24 hours ago.  I was able to pass a 18 [place] gastrostomy without significant difficulty.  No trauma associated with passage of the tube.  Stomach contents immediately returned.  No blood.  We will confirm with Gastrografin x-ray.  There is report of no output from urine however the Foley catheter has urine in it at this time.  Nurse tried irrigating the Foley catheter but was occluded and could not be cleared.  Suprapubic catheter replaced with a 22 [place] coud without difficulty.  This time stable for return to home care. Final Clinical Impression(s) / ED Diagnoses Final diagnoses:  Dislodged gastrostomy tube  Problem with Foley catheter, initial encounter [facility])    Rx / DC Orders ED Discharge Orders     None        Charlesetta Shanks, MD 07/15/20 1622

## 2020-07-15 NOTE — Discharge Instructions (Addendum)
1.  An 41 French gastrostomy tube was successfully placed.  You may continue to use per previous instructions.  Follow-up with your primary care provider for recheck.  If you need additional assistance with your tube or changes contact your primary provider for follow-up. 2.  A 22 French suprapubic catheter replaced.  Continue to use as usual.

## 2020-07-16 ENCOUNTER — Emergency Department (HOSPITAL_COMMUNITY): Payer: Medicare HMO

## 2020-07-16 ENCOUNTER — Other Ambulatory Visit: Payer: Self-pay

## 2020-07-16 DIAGNOSIS — K9423 Gastrostomy malfunction: Secondary | ICD-10-CM | POA: Diagnosis not present

## 2020-07-16 LAB — CBC WITH DIFFERENTIAL/PLATELET
Abs Immature Granulocytes: 0.08 10*3/uL — ABNORMAL HIGH (ref 0.00–0.07)
Basophils Absolute: 0.1 10*3/uL (ref 0.0–0.1)
Basophils Relative: 1 %
Eosinophils Absolute: 0.1 10*3/uL (ref 0.0–0.5)
Eosinophils Relative: 1 %
HCT: 46.1 % (ref 39.0–52.0)
Hemoglobin: 14.6 g/dL (ref 13.0–17.0)
Immature Granulocytes: 1 %
Lymphocytes Relative: 17 %
Lymphs Abs: 2.8 10*3/uL (ref 0.7–4.0)
MCH: 27.4 pg (ref 26.0–34.0)
MCHC: 31.7 g/dL (ref 30.0–36.0)
MCV: 86.7 fL (ref 80.0–100.0)
Monocytes Absolute: 1.2 10*3/uL — ABNORMAL HIGH (ref 0.1–1.0)
Monocytes Relative: 7 %
Neutro Abs: 12 10*3/uL — ABNORMAL HIGH (ref 1.7–7.7)
Neutrophils Relative %: 73 %
Platelets: 605 10*3/uL — ABNORMAL HIGH (ref 150–400)
RBC: 5.32 MIL/uL (ref 4.22–5.81)
RDW: 14.7 % (ref 11.5–15.5)
WBC: 16.3 10*3/uL — ABNORMAL HIGH (ref 4.0–10.5)
nRBC: 0 % (ref 0.0–0.2)

## 2020-07-16 LAB — URINALYSIS, ROUTINE W REFLEX MICROSCOPIC
Bilirubin Urine: NEGATIVE
Glucose, UA: NEGATIVE mg/dL
Hgb urine dipstick: NEGATIVE
Ketones, ur: 80 mg/dL — AB
Nitrite: POSITIVE — AB
Protein, ur: 30 mg/dL — AB
RBC / HPF: >50 RBC/hpf — ABNORMAL HIGH (ref 0–5)
Specific Gravity, Urine: 1.026 (ref 1.005–1.030)
WBC, UA: >50 WBC/hpf — ABNORMAL HIGH (ref 0–5)
pH: 5 (ref 5.0–8.0)

## 2020-07-16 LAB — COMPREHENSIVE METABOLIC PANEL
ALT: 22 U/L (ref 0–44)
AST: 24 U/L (ref 15–41)
Albumin: 3.5 g/dL (ref 3.5–5.0)
Alkaline Phosphatase: 130 U/L — ABNORMAL HIGH (ref 38–126)
Anion gap: 12 (ref 5–15)
BUN: 16 mg/dL (ref 6–20)
CO2: 23 mmol/L (ref 22–32)
Calcium: 10 mg/dL (ref 8.9–10.3)
Chloride: 109 mmol/L (ref 98–111)
Creatinine, Ser: 0.45 mg/dL — ABNORMAL LOW (ref 0.61–1.24)
GFR, Estimated: >60 mL/min (ref >60)
Glucose, Bld: 88 mg/dL (ref 70–99)
Potassium: 3.5 mmol/L (ref 3.5–5.1)
Sodium: 144 mmol/L (ref 135–145)
Total Bilirubin: 0.9 mg/dL (ref 0.3–1.2)
Total Protein: 8.7 g/dL — ABNORMAL HIGH (ref 6.5–8.1)

## 2020-07-16 LAB — LACTIC ACID, PLASMA: Lactic Acid, Venous: 1.5 mmol/L (ref 0.5–1.9)

## 2020-07-16 LAB — RESP PANEL BY RT-PCR (FLU A&B, COVID) ARPGX2
Influenza A by PCR: NEGATIVE
Influenza B by PCR: NEGATIVE
SARS Coronavirus 2 by RT PCR: NEGATIVE

## 2020-07-16 MED ORDER — SODIUM CHLORIDE 0.9 % IV BOLUS
1000.0000 mL | Freq: Once | INTRAVENOUS | Status: AC
  Administered 2020-07-16: 1000 mL via INTRAVENOUS

## 2020-07-16 MED ORDER — CEFPODOXIME PROXETIL 200 MG PO TABS: 200.0000 mg | ORAL_TABLET | Freq: Two times a day (BID) | ORAL | 0 refills | Status: AC

## 2020-07-16 MED ORDER — SODIUM CHLORIDE 0.9 % IV SOLN
1.0000 g | Freq: Once | INTRAVENOUS | Status: AC
  Administered 2020-07-16: 1 g via INTRAVENOUS
  Filled 2020-07-16: qty 10

## 2020-07-16 MED ORDER — KETOROLAC TROMETHAMINE 15 MG/ML IJ SOLN
15.0000 mg | Freq: Once | INTRAMUSCULAR | Status: AC
  Administered 2020-07-16: 15 mg via INTRAVENOUS
  Filled 2020-07-16: qty 1

## 2020-07-16 NOTE — ED Provider Notes (Signed)
Terry Palmer presents to the ER for G-tube dislodgment and was pending discharge after G-tube was replaced successfully.  Nursing staff approached me stating the patient has been persistently tachycardic.  I initiated a work-up including labs imaging and urinalysis.  Source of tachycardia unclear.  Patient has an indwelling Foley catheter suprapubic, with tachycardia and leukocytosis.  This is sent for urinalysis and not surprisingly positive for potential UTI versus colonization.  Given tachycardia and leukocytosis, elected to treat.  After IV fluids antibiotics and Toradol, patient's heart rate appears improved.  We will clear him for discharge back to his facility.   Luna Fuse, MD 07/16/20 [phone]

## 2020-07-16 NOTE — ED Notes (Signed)
Removed from [place] list

## 2020-07-16 NOTE — ED Notes (Signed)
Called PTAR for patient to be transported back home

## 2020-07-18 LAB — URINE CULTURE: Culture: 50000 — AB

## 2020-07-19 ENCOUNTER — Other Ambulatory Visit (HOSPITAL_COMMUNITY): Payer: Self-pay

## 2020-07-19 ENCOUNTER — Telehealth: Payer: Self-pay | Admitting: *Deleted

## 2020-07-19 NOTE — Telephone Encounter (Signed)
Post ED Visit - Positive Culture Follow-up  Culture report reviewed by antimicrobial stewardship pharmacist: [place] Team []  Elenor Quinones, Pharm.D. []  Heide Guile, Pharm.D., BCPS AQ-ID []  Parks Neptune, Pharm.D., BCPS []  Alycia Rossetti, Pharm.D., BCPS []  [place], [place].D., BCPS, AAHIVP []  Legrand Como, Pharm.D., BCPS, AAHIVP []  Salome Arnt, PharmD, BCPS []  [place]nette Gourd, PharmD, BCPS []  Hughes Better, PharmD, BCPS []  Leeroy Cha, PharmD []  Laqueta Linden, PharmD, BCPS []  Albertina Parr, PharmD  [place] Team []  Leodis Sias, PharmD []  Lindell Spar, PharmD []  Royetta Asal, PharmD []  Graylin Shiver, Rph []  Rema Fendt) Glennon Mac, PharmD []  Arlyn Dunning, PharmD []  Netta Cedars, PharmD []  Dia Sitter, PharmD []  Leone Haven, PharmD []  Gretta Arab, PharmD []  Theodis Shove, PharmD []  Peggyann Juba, PharmD []  Reuel Boom, PharmD   Positive urine culture Treated with Cefpodoxime Proxetil, organism sensitive to the same and no further patient follow-up is required at this time. Joetta Manners, PharmD Harlon Flor Talley 07/19/2020, 11:52 AM

## 2020-07-21 LAB — CULTURE, BLOOD (ROUTINE X 2)
Culture: NO GROWTH
Culture: NO GROWTH
Special Requests: ADEQUATE

## 2020-08-10 ENCOUNTER — Emergency Department (HOSPITAL_COMMUNITY): Payer: Medicare HMO

## 2020-08-10 ENCOUNTER — Inpatient Hospital Stay (HOSPITAL_COMMUNITY)
Admission: EM | Admit: 2020-08-10 | Discharge: 2020-08-17 | DRG: 871 | Disposition: A | Discharge status: Hospice | Payer: Medicare HMO | Attending: Internal Medicine | Admitting: Internal Medicine

## 2020-08-10 DIAGNOSIS — M86652 Other chronic osteomyelitis, left thigh: Secondary | ICD-10-CM | POA: Diagnosis present

## 2020-08-10 DIAGNOSIS — G40909 Epilepsy, unspecified, not intractable, without status epilepticus: Secondary | ICD-10-CM | POA: Diagnosis present

## 2020-08-10 DIAGNOSIS — Z20822 Contact with and (suspected) exposure to covid-19: Secondary | ICD-10-CM | POA: Diagnosis present

## 2020-08-10 DIAGNOSIS — Z7401 Bed confinement status: Secondary | ICD-10-CM | POA: Diagnosis not present

## 2020-08-10 DIAGNOSIS — G8250 Quadriplegia, unspecified: Secondary | ICD-10-CM | POA: Diagnosis present

## 2020-08-10 DIAGNOSIS — A419 Sepsis, unspecified organism: Secondary | ICD-10-CM | POA: Diagnosis not present

## 2020-08-10 DIAGNOSIS — D6489 Other specified anemias: Secondary | ICD-10-CM | POA: Diagnosis present

## 2020-08-10 DIAGNOSIS — F0280 Dementia in other diseases classified elsewhere without behavioral disturbance: Secondary | ICD-10-CM | POA: Diagnosis present

## 2020-08-10 DIAGNOSIS — E876 Hypokalemia: Secondary | ICD-10-CM | POA: Diagnosis present

## 2020-08-10 DIAGNOSIS — G9341 Metabolic encephalopathy: Secondary | ICD-10-CM | POA: Diagnosis present

## 2020-08-10 DIAGNOSIS — L89152 Pressure ulcer of sacral region, stage 2: Secondary | ICD-10-CM | POA: Diagnosis present

## 2020-08-10 DIAGNOSIS — R6520 Severe sepsis without septic shock: Secondary | ICD-10-CM | POA: Diagnosis present

## 2020-08-10 DIAGNOSIS — Z794 Long term (current) use of insulin: Secondary | ICD-10-CM | POA: Diagnosis not present

## 2020-08-10 DIAGNOSIS — Z66 Do not resuscitate: Secondary | ICD-10-CM | POA: Diagnosis not present

## 2020-08-10 DIAGNOSIS — Z87891 Personal history of nicotine dependence: Secondary | ICD-10-CM

## 2020-08-10 DIAGNOSIS — R7881 Bacteremia: Secondary | ICD-10-CM | POA: Diagnosis not present

## 2020-08-10 DIAGNOSIS — G35 Multiple sclerosis: Secondary | ICD-10-CM | POA: Diagnosis present

## 2020-08-10 DIAGNOSIS — L89310 Pressure ulcer of right buttock, unstageable: Secondary | ICD-10-CM | POA: Diagnosis present

## 2020-08-10 DIAGNOSIS — Z6822 Body mass index (BMI) 22.0-22.9, adult: Secondary | ICD-10-CM

## 2020-08-10 DIAGNOSIS — Z931 Gastrostomy status: Secondary | ICD-10-CM

## 2020-08-10 DIAGNOSIS — R1310 Dysphagia, unspecified: Secondary | ICD-10-CM | POA: Diagnosis present

## 2020-08-10 DIAGNOSIS — S7290XA Unspecified fracture of unspecified femur, initial encounter for closed fracture: Secondary | ICD-10-CM | POA: Diagnosis present

## 2020-08-10 DIAGNOSIS — E119 Type 2 diabetes mellitus without complications: Secondary | ICD-10-CM | POA: Diagnosis present

## 2020-08-10 DIAGNOSIS — E871 Hypo-osmolality and hyponatremia: Secondary | ICD-10-CM | POA: Diagnosis present

## 2020-08-10 DIAGNOSIS — N319 Neuromuscular dysfunction of bladder, unspecified: Secondary | ICD-10-CM | POA: Diagnosis present

## 2020-08-10 DIAGNOSIS — A4101 Sepsis due to Methicillin susceptible Staphylococcus aureus: Secondary | ICD-10-CM | POA: Diagnosis present

## 2020-08-10 DIAGNOSIS — N3000 Acute cystitis without hematuria: Secondary | ICD-10-CM | POA: Diagnosis not present

## 2020-08-10 DIAGNOSIS — Z7189 Other specified counseling: Secondary | ICD-10-CM | POA: Diagnosis not present

## 2020-08-10 DIAGNOSIS — E872 Acidosis: Secondary | ICD-10-CM | POA: Diagnosis present

## 2020-08-10 DIAGNOSIS — Z79899 Other long term (current) drug therapy: Secondary | ICD-10-CM | POA: Diagnosis not present

## 2020-08-10 DIAGNOSIS — N3001 Acute cystitis with hematuria: Secondary | ICD-10-CM

## 2020-08-10 DIAGNOSIS — R64 Cachexia: Secondary | ICD-10-CM | POA: Diagnosis present

## 2020-08-10 DIAGNOSIS — Z9359 Other cystostomy status: Secondary | ICD-10-CM

## 2020-08-10 DIAGNOSIS — Z515 Encounter for palliative care: Secondary | ICD-10-CM | POA: Diagnosis not present

## 2020-08-10 DIAGNOSIS — G928 Other toxic encephalopathy: Secondary | ICD-10-CM | POA: Diagnosis present

## 2020-08-10 DIAGNOSIS — R5381 Other malaise: Secondary | ICD-10-CM | POA: Diagnosis present

## 2020-08-10 DIAGNOSIS — B9561 Methicillin susceptible Staphylococcus aureus infection as the cause of diseases classified elsewhere: Secondary | ICD-10-CM | POA: Diagnosis not present

## 2020-08-10 DIAGNOSIS — N390 Urinary tract infection, site not specified: Secondary | ICD-10-CM | POA: Diagnosis present

## 2020-08-10 LAB — COMPREHENSIVE METABOLIC PANEL
ALT: 24 U/L (ref 0–44)
AST: 28 U/L (ref 15–41)
Albumin: 1.7 g/dL — ABNORMAL LOW (ref 3.5–5.0)
Alkaline Phosphatase: 106 U/L (ref 38–126)
Anion gap: 12 (ref 5–15)
BUN: 17 mg/dL (ref 6–20)
CO2: 22 mmol/L (ref 22–32)
Calcium: 8 mg/dL — ABNORMAL LOW (ref 8.9–10.3)
Chloride: 97 mmol/L — ABNORMAL LOW (ref 98–111)
Creatinine, Ser: 0.37 mg/dL — ABNORMAL LOW (ref 0.61–1.24)
GFR, Estimated: >60 mL/min (ref >60)
Glucose, Bld: 144 mg/dL — ABNORMAL HIGH (ref 70–99)
Potassium: 2.9 mmol/L — ABNORMAL LOW (ref 3.5–5.1)
Sodium: 131 mmol/L — ABNORMAL LOW (ref 135–145)
Total Bilirubin: 0.5 mg/dL (ref 0.3–1.2)
Total Protein: 6 g/dL — ABNORMAL LOW (ref 6.5–8.1)

## 2020-08-10 LAB — CBC WITH DIFFERENTIAL/PLATELET
Abs Immature Granulocytes: 0.26 10*3/uL — ABNORMAL HIGH (ref 0.00–0.07)
Basophils Absolute: 0 10*3/uL (ref 0.0–0.1)
Basophils Relative: 0 %
Eosinophils Absolute: 0 10*3/uL (ref 0.0–0.5)
Eosinophils Relative: 0 %
HCT: 33.6 % — ABNORMAL LOW (ref 39.0–52.0)
Hemoglobin: 10.7 g/dL — ABNORMAL LOW (ref 13.0–17.0)
Immature Granulocytes: 2 %
Lymphocytes Relative: 4 %
Lymphs Abs: 0.7 10*3/uL (ref 0.7–4.0)
MCH: 26.1 pg (ref 26.0–34.0)
MCHC: 31.8 g/dL (ref 30.0–36.0)
MCV: 82 fL (ref 80.0–100.0)
Monocytes Absolute: 0.6 10*3/uL (ref 0.1–1.0)
Monocytes Relative: 3 %
Neutro Abs: 15.7 10*3/uL — ABNORMAL HIGH (ref 1.7–7.7)
Neutrophils Relative %: 91 %
Platelets: 418 10*3/uL — ABNORMAL HIGH (ref 150–400)
RBC: 4.1 MIL/uL — ABNORMAL LOW (ref 4.22–5.81)
RDW: 15.9 % — ABNORMAL HIGH (ref 11.5–15.5)
WBC: 17.3 10*3/uL — ABNORMAL HIGH (ref 4.0–10.5)
nRBC: 0 % (ref 0.0–0.2)

## 2020-08-10 LAB — URINALYSIS, ROUTINE W REFLEX MICROSCOPIC
Bilirubin Urine: NEGATIVE
Glucose, UA: NEGATIVE mg/dL
Ketones, ur: NEGATIVE mg/dL
Nitrite: NEGATIVE
Protein, ur: 30 mg/dL — AB
Specific Gravity, Urine: 1.024 (ref 1.005–1.030)
pH: 7 (ref 5.0–8.0)

## 2020-08-10 LAB — HEMOGLOBIN A1C
Hgb A1c MFr Bld: 7.1 % — ABNORMAL HIGH (ref 4.8–5.6)
Mean Plasma Glucose: 157.07 mg/dL

## 2020-08-10 LAB — LACTIC ACID, PLASMA
Lactic Acid, Venous: 2.4 mmol/L (ref 0.5–1.9)
Lactic Acid, Venous: 1.4 mmol/L (ref 0.5–1.9)

## 2020-08-10 LAB — RESP PANEL BY RT-PCR (FLU A&B, COVID) ARPGX2
Influenza A by PCR: NEGATIVE
Influenza B by PCR: NEGATIVE
SARS Coronavirus 2 by RT PCR: NEGATIVE

## 2020-08-10 LAB — PROCALCITONIN: Procalcitonin: 5.3 ng/mL

## 2020-08-10 LAB — PROTIME-INR
INR: 1.2 (ref 0.8–1.2)
Prothrombin Time: 15.4 seconds — ABNORMAL HIGH (ref 11.4–15.2)

## 2020-08-10 LAB — APTT: aPTT: 35 seconds (ref 24–36)

## 2020-08-10 LAB — MAGNESIUM: Magnesium: 1.8 mg/dL (ref 1.7–2.4)

## 2020-08-10 LAB — PHOSPHORUS: Phosphorus: 3.2 mg/dL (ref 2.5–4.6)

## 2020-08-10 MED ORDER — IPRATROPIUM-ALBUTEROL 0.5-2.5 (3) MG/3ML IN SOLN: 3.0000 mL | RESPIRATORY_TRACT | Status: DC

## 2020-08-10 MED ORDER — ENOXAPARIN SODIUM 40 MG/0.4ML IJ SOSY
40.0000 mg | PREFILLED_SYRINGE | INTRAMUSCULAR | Status: DC
  Administered 2020-08-11 – 2020-08-16 (×6): 40 mg via SUBCUTANEOUS
  Filled 2020-08-10 (×7): qty 0.4

## 2020-08-10 MED ORDER — LEVETIRACETAM 100 MG/ML PO SOLN
1200.0000 mg | Freq: Three times a day (TID) | ORAL | Status: DC
  Administered 2020-08-11 – 2020-08-16 (×19): 1200 mg
  Filled 2020-08-10 (×20): qty 15

## 2020-08-10 MED ORDER — ACETAMINOPHEN 650 MG RE SUPP
650.0000 mg | Freq: Four times a day (QID) | RECTAL | Status: DC | PRN
  Filled 2020-08-10: qty 1

## 2020-08-10 MED ORDER — INSULIN ASPART 100 UNIT/ML IJ SOLN
0.0000 [IU] | INTRAMUSCULAR | Status: DC
  Administered 2020-08-11 (×2): 1 [IU] via SUBCUTANEOUS
  Administered 2020-08-12: 3 [IU] via SUBCUTANEOUS
  Administered 2020-08-12: 2 [IU] via SUBCUTANEOUS
  Administered 2020-08-12: 1 [IU] via SUBCUTANEOUS
  Administered 2020-08-12 (×2): 2 [IU] via SUBCUTANEOUS
  Administered 2020-08-13: 1 [IU] via SUBCUTANEOUS
  Administered 2020-08-13 (×2): 2 [IU] via SUBCUTANEOUS
  Administered 2020-08-14 (×3): 1 [IU] via SUBCUTANEOUS
  Administered 2020-08-14: 2 [IU] via SUBCUTANEOUS
  Administered 2020-08-15: 1 [IU] via SUBCUTANEOUS
  Administered 2020-08-15: 2 [IU] via SUBCUTANEOUS
  Administered 2020-08-15: 1 [IU] via SUBCUTANEOUS
  Administered 2020-08-16: 2 [IU] via SUBCUTANEOUS
  Administered 2020-08-16 (×5): 1 [IU] via SUBCUTANEOUS

## 2020-08-10 MED ORDER — VANCOMYCIN HCL 10 G IV SOLR
2250.0000 mg | Freq: Three times a day (TID) | INTRAVENOUS | Status: DC
  Filled 2020-08-10 (×2): qty 2250

## 2020-08-10 MED ORDER — PROSOURCE TF PO LIQD
45.0000 mL | Freq: Three times a day (TID) | ORAL | Status: DC
  Administered 2020-08-11 – 2020-08-16 (×18): 45 mL
  Filled 2020-08-10 (×19): qty 45

## 2020-08-10 MED ORDER — BACLOFEN 10 MG PO TABS
20.0000 mg | ORAL_TABLET | Freq: Four times a day (QID) | ORAL | Status: DC
  Administered 2020-08-11 – 2020-08-16 (×25): 20 mg
  Filled 2020-08-10: qty 2
  Filled 2020-08-10: qty 1
  Filled 2020-08-10 (×9): qty 2
  Filled 2020-08-10: qty 1
  Filled 2020-08-10: qty 2
  Filled 2020-08-10: qty 1
  Filled 2020-08-10 (×12): qty 2
  Filled 2020-08-10 (×2): qty 1

## 2020-08-10 MED ORDER — FREE WATER
240.0000 mL | Status: DC
  Administered 2020-08-11 – 2020-08-12 (×9): 240 mL

## 2020-08-10 MED ORDER — INSULIN DETEMIR 100 UNIT/ML ~~LOC~~ SOLN: 22.0000 [IU] | Freq: Every day | SUBCUTANEOUS | Status: DC

## 2020-08-10 MED ORDER — ACETAMINOPHEN 650 MG RE SUPP
650.0000 mg | Freq: Once | RECTAL | Status: AC
  Administered 2020-08-10: 650 mg via RECTAL
  Filled 2020-08-10: qty 1

## 2020-08-10 MED ORDER — POLYVINYL ALCOHOL 1.4 % OP SOLN
1.0000 [drp] | Freq: Two times a day (BID) | OPHTHALMIC | Status: DC
  Administered 2020-08-11 – 2020-08-16 (×12): 1 [drp] via OPHTHALMIC
  Filled 2020-08-10: qty 15

## 2020-08-10 MED ORDER — LACTATED RINGERS IV BOLUS
1000.0000 mL | Freq: Once | INTRAVENOUS | Status: AC
  Administered 2020-08-10: 1000 mL via INTRAVENOUS

## 2020-08-10 MED ORDER — LACTATED RINGERS IV BOLUS
1500.0000 mL | Freq: Once | INTRAVENOUS | Status: AC
  Administered 2020-08-10: 1500 mL via INTRAVENOUS

## 2020-08-10 MED ORDER — VANCOMYCIN HCL 1750 MG/350ML IV SOLN
1750.0000 mg | Freq: Once | INTRAVENOUS | Status: AC
  Administered 2020-08-10: 1750 mg via INTRAVENOUS
  Filled 2020-08-10: qty 350

## 2020-08-10 MED ORDER — SODIUM CHLORIDE 0.9 % IV SOLN: INTRAVENOUS | Status: DC

## 2020-08-10 MED ORDER — SODIUM CHLORIDE 0.9 % IV SOLN
2.0000 g | Freq: Once | INTRAVENOUS | Status: AC
  Administered 2020-08-10: 2 g via INTRAVENOUS
  Filled 2020-08-10: qty 2

## 2020-08-10 MED ORDER — METRONIDAZOLE 500 MG/100ML IV SOLN
500.0000 mg | Freq: Once | INTRAVENOUS | Status: AC
  Administered 2020-08-10: 500 mg via INTRAVENOUS
  Filled 2020-08-10: qty 100

## 2020-08-10 MED ORDER — FAMOTIDINE 40 MG/5ML PO SUSR
20.0000 mg | Freq: Every day | ORAL | Status: DC
  Administered 2020-08-11 – 2020-08-16 (×6): 20 mg
  Filled 2020-08-10 (×7): qty 2.5

## 2020-08-10 MED ORDER — BUPROPION HCL 75 MG PO TABS
75.0000 mg | ORAL_TABLET | Freq: Three times a day (TID) | ORAL | Status: DC
  Administered 2020-08-11 – 2020-08-16 (×19): 75 mg
  Filled 2020-08-10 (×21): qty 1

## 2020-08-10 MED ORDER — SODIUM CHLORIDE 0.9 % IV SOLN
2.0000 g | Freq: Three times a day (TID) | INTRAVENOUS | Status: DC
  Administered 2020-08-11 – 2020-08-12 (×5): 2 g via INTRAVENOUS
  Filled 2020-08-10 (×6): qty 2

## 2020-08-10 MED ORDER — DANTROLENE SODIUM 25 MG PO CAPS
50.0000 mg | ORAL_CAPSULE | Freq: Four times a day (QID) | ORAL | Status: DC
  Administered 2020-08-11 – 2020-08-16 (×24): 50 mg via JEJUNOSTOMY
  Filled 2020-08-10 (×27): qty 2

## 2020-08-10 MED ORDER — POTASSIUM CHLORIDE 10 MEQ/100ML IV SOLN
10.0000 meq | INTRAVENOUS | Status: AC
  Administered 2020-08-10 – 2020-08-11 (×3): 10 meq via INTRAVENOUS
  Filled 2020-08-10 (×3): qty 100

## 2020-08-10 MED ORDER — TIZANIDINE HCL 4 MG PO TABS
2.0000 mg | ORAL_TABLET | Freq: Three times a day (TID) | ORAL | Status: DC
  Administered 2020-08-11 – 2020-08-16 (×17): 2 mg
  Filled 2020-08-10 (×21): qty 1

## 2020-08-10 MED ORDER — ACETAMINOPHEN 325 MG PO TABS: 650.0000 mg | ORAL_TABLET | Freq: Four times a day (QID) | ORAL | Status: DC | PRN

## 2020-08-10 MED ORDER — POTASSIUM CHLORIDE 10 MEQ/100ML IV SOLN
10.0000 meq | INTRAVENOUS | Status: DC
Start: 2020-08-10 — End: 2020-08-10

## 2020-08-10 MED ORDER — MAGNESIUM SULFATE IN D5W 1-5 GM/100ML-% IV SOLN
1.0000 g | Freq: Once | INTRAVENOUS | Status: AC
  Administered 2020-08-10: 1 g via INTRAVENOUS
  Filled 2020-08-10: qty 100

## 2020-08-10 MED ORDER — HYDROCODONE-ACETAMINOPHEN 5-325 MG PO TABS: 1.0000 | ORAL_TABLET | ORAL | Status: DC | PRN

## 2020-08-10 MED ORDER — GLUCERNA 1.5 CAL PO LIQD
1280.0000 mL | ORAL | Status: DC
  Filled 2020-08-10: qty 2000

## 2020-08-10 MED ORDER — OMEPRAZOLE 2 MG/ML ORAL SUSPENSION: 40.0000 mg | Freq: Every day | ORAL | Status: DC

## 2020-08-10 MED ORDER — VANCOMYCIN HCL 1750 MG/350ML IV SOLN
1750.0000 mg | Freq: Two times a day (BID) | INTRAVENOUS | Status: DC
  Administered 2020-08-11: 1750 mg via INTRAVENOUS
  Filled 2020-08-10: qty 350

## 2020-08-10 NOTE — ED Notes (Signed)
Pt rolled to check rectal temp and view wounds.  Pt moved loose stool.  Pt cleaned and bedding changed.  One stage 3 and one stage 4 pressure wound were noted on pt's buttocks. The stag 4 is packed with moist gauze and covered with an ABD pad.  One stage 2 pressure wound was noted to left upper back near scapula.

## 2020-08-10 NOTE — ED Triage Notes (Signed)
Pt BIB by [facility] for possible Sepsis.  Pt has hx of pressure wounds and frequent UTI's.  PT has chronic foley use.  Pt pressure wounds on bottom were tx with antibiotics and hydrotherapy approx. 6 wks ago but have been worsening recently,.  Pt was tachycardic and tachepneic for EMS.  Family reports fever being tx with tylenol at [facility].   Pt has hx of MS and is contracted in all 4 limbs.

## 2020-08-10 NOTE — Progress Notes (Addendum)
Pharmacy Antibiotic Note  [name] is a 45 y.o. male admitted on 08/10/2020 with sepsis.  Pharmacy has been consulted for vancomycin and cefepime dosing.  Plan: Vanc '1750mg'$  x1 then '1750mg'$  IV q12h (eAUC 490, Cr rounded up to 0.'8mg'$ /dL Cefepime 2g IV q8h -Monitor renal function, clinical status, and antibiotic plan  Weight: 83.9 kg (184 lb 15.5 oz)  Temp (24hrs), Avg:101 F (38.3 C), Min:99.9 F (37.7 C), Max:102.3 F (39.1 C)  Recent Labs  Lab 08/10/20 1742 08/10/20 1952  WBC 17.3*  --   CREATININE  --  0.37*  LATICACIDVEN 2.4* 1.4    Estimated Creatinine Clearance: 136.9 mL/min (A) (by C-G formula based on SCr of 0.37 mg/dL (L)).    No Known Allergies  Antimicrobials this admission: Vanc 8/3 >>  Cefepime 8/3 >>  Flagyl x1 per MD  Dose adjustments this admission: N/A  Microbiology results: 8/3 BCx:  8/3 UCx:   Thank you for allowing pharmacy to be a part of this patient's care.  Joetta Manners, PharmD, [facility] Emergency Medicine Clinical Pharmacist ED RPh Phone: [place]: [phone]

## 2020-08-10 NOTE — ED Notes (Signed)
Pt is extremely difficult stick with hx of PICC access in past.  IV team has been consulted for labs and med administration.

## 2020-08-10 NOTE — H&P (Addendum)
Terry Palmer BVQ:[id number] DOB: 1975/07/09 DOA: 08/10/2020     PCP: Reymundo Poll, MD      Patient arrived to ER on 08/10/20 at 1541 Referred by Attending Long, Wonda Olds, MD   Patient coming from: home Lives  With family    Chief Complaint:  fever, confusion    HPI: Terry Palmer is a 45 y.o. male with medical history significant of  MS with quadriparesis sp suprapubic catheter    Presented with   fever, foul smelling urine Family reports similar presentation in the past due to UTIs Has suprapubic catheter Family noted some thick urine lately.  has chronic ulcers Patient at baseline unable to communicate lately he seems to be more somnolent. Bedbound after the multiple sclerosis is decompensated in 2012      Initial COVID TEST  NEGATIVE   Lab Results  Component Value Date   [place] 08/10/2020   [place] NEGATIVE 07/16/2020   [place] NEGATIVE 05/18/2020     Regarding pertinent Chronic problems:     HTN on clonidine   DM 2 -  Lab Results  Component Value Date   HGBA1C 8.6 (H) 05/20/2020   on insulin,      Chronic anemia - baseline hg Hemoglobin & Hematocrit  Recent Labs    05/30/20 0143 07/16/20 0012 08/10/20 1742  HGB 10.7* 14.6 10.7*     While in ER: Appears septic started on broad spectrum IV antibiotics UA appears to be the source    ED Triage Vitals  Enc Vitals Group     BP 08/10/20 1617 (!) 142/89     Pulse Rate 08/10/20 1617 (!) 153     Resp 08/10/20 1617 (!) 23     Temp 08/10/20 1617 (!) 100.5 F (38.1 C)     Temp Source 08/10/20 1617 Rectal     SpO2 08/10/20 1617 92 %     Weight 08/10/20 1600 184 lb 15.5 oz (83.9 kg)     Height --      Head Circumference --      Peak Flow --      Pain Score --      Pain Loc --      Pain Edu? --      Excl. in [place]? --   TMAX(24)@     _________________________________________ Significant initial  Findings: Abnormal Labs Reviewed  LACTIC ACID, PLASMA - Abnormal; Notable  for the following components:      Result Value   Lactic Acid, Venous 2.4 (*)    All other components within normal limits  CBC WITH DIFFERENTIAL/PLATELET - Abnormal; Notable for the following components:   WBC 17.3 (*)    RBC 4.10 (*)    Hemoglobin 10.7 (*)    HCT 33.6 (*)    RDW 15.9 (*)    Platelets 418 (*)    Neutro Abs 15.7 (*)    Abs Immature Granulocytes 0.26 (*)    All other components within normal limits  URINALYSIS, ROUTINE W REFLEX MICROSCOPIC - Abnormal; Notable for the following components:   Color, Urine AMBER (*)    APPearance CLOUDY (*)    Hgb urine dipstick SMALL (*)    Protein, ur 30 (*)    Leukocytes,Ua LARGE (*)    Bacteria, UA MANY (*)    All other components within normal limits  COMPREHENSIVE METABOLIC PANEL - Abnormal; Notable for the following components:   Sodium 131 (*)    Potassium 2.9 (*)  Chloride 97 (*)    Glucose, Bld 144 (*)    Creatinine, Ser 0.37 (*)    Calcium 8.0 (*)    Total Protein 6.0 (*)    Albumin 1.7 (*)    All other components within normal limits  PROTIME-INR - Abnormal; Notable for the following components:   Prothrombin Time 15.4 (*)    All other components within normal limits   ____________________________________________ Ordered    CXR -  NON acute     ECG: Ordered Personally reviewed by me showing: HR : 151 Rhythm:   Sinus tachycardia   , no evidence of ischemic changes QTC 419 ____________________ This patient meets SIRS Criteria and may be septic.      The recent clinical data is shown below. Vitals:   08/10/20 2045 08/10/20 2100 08/10/20 2115 08/10/20 2130  BP: 105/73 96/65 100/64 105/60  Pulse: (!) 126 (!) 124 84 (!) 124  Resp: (!) 21 19 (!) 21 (!) 23  Temp:      TempSrc:      SpO2: 98% 97% 95% 97%  Weight:        WBC     Component Value Date/Time   WBC 17.3 (H) 08/10/2020 1742   LYMPHSABS 0.7 08/10/2020 1742   MONOABS 0.6 08/10/2020 1742   EOSABS 0.0 08/10/2020 1742   BASOSABS 0.0  08/10/2020 1742    Lactic Acid, Venous    Component Value Date/Time   LATICACIDVEN 1.4 08/10/2020 1952     Procalcitonin  Ordered    UA   evidence of UTI      Urine analysis:    Component Value Date/Time   COLORURINE AMBER (A) 08/10/2020 1635   APPEARANCEUR CLOUDY (A) 08/10/2020 1635   LABSPEC 1.024 08/10/2020 1635   PHURINE 7.0 08/10/2020 1635   GLUCOSEU NEGATIVE 08/10/2020 1635   HGBUR SMALL (A) 08/10/2020 1635   BILIRUBINUR NEGATIVE 08/10/2020 1635   KETONESUR NEGATIVE 08/10/2020 1635   PROTEINUR 30 (A) 08/10/2020 1635   UROBILINOGEN 0.2 10/20/2014 1328   NITRITE NEGATIVE 08/10/2020 1635   LEUKOCYTESUR LARGE (A) 08/10/2020 1635    Results for orders placed or performed during the hospital encounter of 08/10/20  Resp Panel by RT-PCR (Flu A&B, Covid) Nasopharyngeal Swab     Status: None   Collection Time: 08/10/20  3:53 PM   Specimen: Nasopharyngeal Swab; Nasopharyngeal(NP) swabs in vial transport medium  Result Value Ref Range Status   SARS Coronavirus 2 by RT PCR NEGATIVE NEGATIVE Final         Influenza A by PCR NEGATIVE NEGATIVE Final   Influenza B by PCR NEGATIVE NEGATIVE Final          _______________________________________________ Hospitalist was called for admission for  Sepsis and UTI  The following Work up has been ordered so far:  Orders Placed This Encounter  Procedures   Critical Care   Urine Culture   Blood Culture (routine x 2)   Resp Panel by RT-PCR (Flu A&B, Covid) Nasopharyngeal Swab   DG Chest Port 1 View   Lactic acid, plasma   CBC WITH DIFFERENTIAL   Urinalysis, Routine w reflex microscopic   Miscellaneous Genetic Test (non-interfaced send-out)   Comprehensive metabolic panel   Protime-INR   APTT   Comprehensive metabolic panel   Magnesium   Diet NPO time specified   Cardiac monitoring   Document height and weight   Assess and Document Glasgow Coma Scale   Document vital signs within 1-hour of fluid bolus completion.  Notify  provider of abnormal vital signs despite fluid resuscitation.   Refer to Sidebar Report: Sepsis Bundle ED/IP   Notify provider for difficulties obtaining IV access   Initiate Carrier Fluid Protocol   DO NOT delay antibiotics if unable to obtain blood culture.   Code Sepsis activation.  This occurs automatically when order is signed and prioritizes pharmacy, lab, and radiology services for STAT collections and interventions.  If [facility] downtime, call [facility] [phone]) to activate Code Sepsis.   ceFEPime (MAXIPIME) per pharmacy consult            vancomycin per pharmacy consult   Consult for [facility] Admission   Airborne and Contact precautions   Pulse oximetry, continuous   ED EKG 12-Lead   EKG 12-Lead   Insert peripheral IV X 1   Insert 2nd peripheral IV if not already present.      Following Medications were ordered in ER: Medications  potassium chloride 10 mEq in 100 mL IVPB (has no administration in time range)  ceFEPIme (MAXIPIME) 2 g in sodium chloride 0.9 % 100 mL IVPB (has no administration in time range)  vancomycin (VANCOCIN) 2,250 mg in sodium chloride 0.9 % 500 mL IVPB (has no administration in time range)  lactated ringers bolus 1,000 mL (0 mLs Intravenous Stopped 08/10/20 1951)  ceFEPIme (MAXIPIME) 2 g in sodium chloride 0.9 % 100 mL IVPB (0 g Intravenous Stopped 08/10/20 1911)  metroNIDAZOLE (FLAGYL) IVPB 500 mg (0 mg Intravenous Stopped 08/10/20 1939)  vancomycin (VANCOREADY) IVPB 1750 mg/350 mL (0 mg Intravenous Stopped 08/10/20 2039)  acetaminophen (TYLENOL) suppository 650 mg (650 mg Rectal Given 08/10/20 1919)  lactated ringers bolus 1,500 mL (0 mLs Intravenous Stopped 08/10/20 2031)       OTHER Significant initial  Findings:  labs showing:    Recent Labs  Lab 08/10/20 1952  NA 131*  K 2.9*  CO2 22  GLUCOSE 144*  BUN 17  CREATININE 0.37*  CALCIUM 8.0*    Cr   stable,  Lab Results  Component Value Date   CREATININE 0.37 (L) 08/10/2020    CREATININE 0.45 (L) 07/16/2020   CREATININE 0.36 (L) 05/30/2020    Recent Labs  Lab 08/10/20 1952  AST 28  ALT 24  ALKPHOS 106  BILITOT 0.5  PROT 6.0*  ALBUMIN 1.7*   Lab Results  Component Value Date   CALCIUM 8.0 (L) 08/10/2020   PHOS 2.6 02/13/2016    Plt: Lab Results  Component Value Date   PLT 418 (H) 08/10/2020       Recent Labs  Lab 08/10/20 1742  WBC 17.3*  NEUTROABS 15.7*  HGB 10.7*  HCT 33.6*  MCV 82.0  PLT 418*    Down *Up from baseline see below    Component Value Date/Time   HGB 10.7 (L) 08/10/2020 1742   HCT 33.6 (L) 08/10/2020 1742   MCV 82.0 08/10/2020 1742       DM  labs:  HbA1C: Recent Labs    05/20/20 0827  HGBA1C 8.6*       CBG (last 3)  No results for input(s): GLUCAP in the last 72 hours.        Cultures:    Component Value Date/Time   SDES BLOOD RIGHT HAND 07/16/2020 0059   SPECREQUEST  07/16/2020 0059    BOTTLES DRAWN AEROBIC AND ANAEROBIC Blood Culture adequate volume   CULT  07/16/2020 0059    NO GROWTH 5 DAYS Performed at [facility], [place],  [place] [address]    REPTSTATUS 07/21/2020 FINAL 07/16/2020 0059     Radiological Exams on Admission: DG Chest Port 1 View  Result Date: 08/10/2020 CLINICAL DATA:  Questionable sepsis. EXAM: PORTABLE CHEST 1 VIEW.  Patient is rotated. COMPARISON:  Chest x-ray 07/16/2020, chest x-ray 10/28/2012 FINDINGS: The heart size and mediastinal contours are unchanged. Persistent elevation of left hemidiaphragm. Low lung volumes. Left base linear atelectasis versus scarring. No focal consolidation. No pulmonary edema. No pleural effusion. No pneumothorax. No acute osseous abnormality. IMPRESSION: No active disease. Electronically Signed   By: Iven Finn M.D.   On: 08/10/2020 17:06   _______________________________________________________________________________________________________ Latest  Blood pressure 105/60, pulse (!) 124, temperature 99.9 F (37.7 C),  temperature source Axillary, resp. rate (!) 23, weight 83.9 kg, SpO2 97 %.   Review of Systems:    Pertinent positives include:  Fevers, chills,confusion  change in color of urine,  Constitutional:  No weight loss, night sweats,  fatigue, weight loss  HEENT:  No headaches, Difficulty swallowing,Tooth/dental problems,Sore throat,  No sneezing, itching, ear ache, nasal congestion, post nasal drip,  Cardio-vascular:  No chest pain, Orthopnea, PND, anasarca, dizziness, palpitations.no Bilateral lower extremity swelling  GI:  No heartburn, indigestion, abdominal pain, nausea, vomiting, diarrhea, change in bowel habits, loss of appetite, melena, blood in stool, hematemesis Resp:  no shortness of breath at rest. No dyspnea on exertion, No excess mucus, no productive cough, No non-productive cough, No coughing up of blood.No change in color of mucus.No wheezing. Skin:  no rash or lesions. No jaundice GU:  no dysuria,no urgency or frequency. No straining to urinate.  No flank pain.  Musculoskeletal:  No joint pain or no joint swelling. No decreased range of motion. No back pain.  Psych:  No change in mood or affect. No depression or anxiety. No memory loss.  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   MS (multiple sclerosis) (HCC)    Neurogenic bladder    Neuromuscular disorder (HCC)    Quadraperesis   Normocytic anemia 05/28/2011   Quadriparesis (muscle weakness) 03/12/2011    Quadriplegia and quadriparesis ([place]) 12/24/2014   Recurrent upper respiratory infection (URI)    Recurrent UTI    Seizure disorder ([place])    Shortness of breath       Past Surgical History:  Procedure Laterality Date   GASTROSTOMY  04/16/2011   Procedure: GASTROSTOMY;  Surgeon: Zenovia Jarred, MD;  Location: [facility];  Service: General;  Laterality: N/A;  Open G-Tube placement   LUMBAR PUNCTURE  10/12/2002   SUPRAPUBIC CYSTOSTOMY      Social History:  Ambulatory bed bound     reports that he quit smoking about 21 years ago. His smoking use included cigarettes. He has quit using smokeless tobacco. He reports that he does not drink alcohol. No history on file for drug use.     Family History:   Family History  Problem Relation Age of Onset   Asthma Mother    ______________________________________________________________________________________________ Allergies: No Known Allergies   Prior to Admission medications   Medication Sig  Start Date End Date Taking? Authorizing Provider  Accu-Chek Softclix Lancets lancets use as directed to check blood sugar up to 4 times daily 06/02/20   Mariel Aloe, MD  acetaminophen (TYLENOL) 500 MG tablet Take 1 tablet (500 mg total) by mouth every 8 (eight) hours as needed for fever (or pain). Patient taking differently: Take 500 mg by mouth 2 (two) times daily. 03/05/15   Debbe Odea, MD  ALPRAZolam Duanne Moron) 0.25 MG tablet From 5/26 to 5/30 Give 0.57m per tube two times daily; then from 5/31 to 6/6 decrease to 0.231mper tube at bedtime. Then on 6/7 to 6/11, decrease to 0.2537mer tube at bedtime every OTHER day. Then STOP 06/02/20   NetMariel AloeD  AZO-CRANBERRY PO Take 1 tablet by mouth 2 (two) times daily.    [provider]  baclofen (LIORESAL) 20 MG tablet Place 1 tablet (20 mg total) into feeding tube 4 (four) times daily. 07/13/15   WilKathrynn DuckingD  bisacodyl (DULCOLAX) 10 MG suppository Place 10 mg rectally once as  needed for moderate constipation.    [provider]  Blood Glucose Monitoring Suppl (ACCU-CHEK GUIDE) w/Device KIT 1 each by Does not apply route as needed. 06/02/20   NetMariel AloeD  buPROPion (WELLBUTRIN) 75 MG tablet Place 1 tablet (75 mg total) into feeding tube 3 (three) times daily. 01/24/13   KriBonnielee HaffD  cloNIDine (CATAPRES) 0.1 MG tablet Place 1 tablet (0.1 mg total) into feeding tube 3 (three) times daily. 06/05/20 09/03/20  NetMariel AloeD  Cobalamin Combinations (OPURITY B12/FOLIC ACID) 1005364-680G TABS Take 0.5 tablets by mouth daily.    [provider]  dantrolene (DANTRIUM) 50 MG capsule Give 50 mg by tube 4 (four) times daily. At 0800, 1200, 1600, 2000    [provider]  diazepam (VALIUM) 5 MG tablet Place 1 tablet (5 mg total) into feeding tube at bedtime as needed for anxiety (sleep). Hold until completion of Ativan 06/05/20   NetMariel AloeD  famotidine (PEPCID) 40 MG/5ML suspension Take 20 mg by mouth daily. 04/28/20   [provider]  glucose blood (ACCU-CHEK GUIDE) test strip Use to check blood sugar up to four times daily 06/02/20   NetMariel AloeD  guaifenesin (ROBITUSSIN) 100 MG/5ML syrup Take 200 mg by mouth 3 (three) times daily as needed for cough.    [provider]  HYDROcodone-acetaminophen (NORCO) 7.5-325 MG per tablet Place 1 tablet into feeding tube every 6 (six) hours as needed (pain).     [provider]  insulin detemir (LEVEMIR) 100 UNIT/ML injection Inject 0.22 mLs (22 Units total) into the skin daily. 06/06/20 09/04/20  NetMariel AloeD  Insulin Syringe-Needle U-100 (INSULIN SYRINGE .3CC/31GX5/16") 31G X 5/16" 0.3 ML MISC Use with Levemir prescription. 06/05/20   NetMariel AloeD  ipratropium-albuterol (DUONEB) 0.5-2.5 (3) MG/3ML SOLN Take 3 mLs by nebulization See admin instructions. Four times daily and as needed for shortness of breath    [provider]  levETIRAcetam  (KEPPRA) 100 MG/ML solution Take 1,200 mg by mouth in the morning, at noon, and at bedtime. Wife gives differently:  38m55mD (= 1000mg42m dose)    [provider]  Multiple Vitamin (MULTIVITAMIN WITH MINERALS) TABS tablet Place 1 tablet into feeding tube daily. 01/24/13   KrishBonnielee Haff nutrition supplement, JUVEN, (JUVEFanny DanceK Place 1 packet into feeding tube 2 (two) times daily between meals. 06/05/20 09/03/20  Mariel Aloe, MD  Nutritional Supplements (FEEDING SUPPLEMENT, GLUCERNA 1.5 CAL,) LIQD Place 1,280 mLs into feeding tube daily. 06/06/20 09/04/20  Mariel Aloe, MD  Nutritional Supplements (FEEDING SUPPLEMENT, PROSOURCE TF,) liquid Place 45 mLs into feeding tube 3 (three) times daily. 06/05/20 09/03/20  Mariel Aloe, MD  omeprazole (PRILOSEC) 2 mg/mL SUSP Place 20 mLs (40 mg total) into feeding tube daily. 01/24/13   Bonnielee Haff, MD  polyethylene glycol [facility] / Floria Raveling) packet Place 17 g into feeding tube daily. Patient taking differently: Place 17 g into feeding tube every evening. 01/24/13   Bonnielee Haff, MD  polyvinyl alcohol (LIQUIFILM TEARS) 1.4 % ophthalmic solution Place 1 drop into both eyes 3 (three) times daily. Patient taking differently: Place 1 drop into both eyes 2 (two) times daily. 01/24/13   Bonnielee Haff, MD  tiZANidine (ZANAFLEX) 2 MG tablet Take 1 tablet (2 mg total) by mouth every 6 (six) hours as needed for muscle spasms. Patient taking differently: Place 2 mg into feeding tube 3 (three) times daily. 11/25/15   Kathrynn Ducking, MD  vitamin C (ASCORBIC ACID) 500 MG tablet Place 500 mg into feeding tube daily.     [provider]  Water For Irrigation, Sterile (FREE WATER) SOLN Place 240 mLs into feeding tube every 4 (four) hours. 01/24/13   Bonnielee Haff, MD    ___________________________________________________________________________________________________ Physical Exam: Vitals with BMI 08/10/2020 08/10/2020 08/10/2020  Height - -  -  Weight - - -  BMI - - -  Systolic 825 053 96  Diastolic 60 64 65  Pulse 976 84 124     1. General:  in No  Acute distress    Chronically ill   -appearing 2. Psychological: somnolent not oriented 3. Head/ENT:   Dry Mucous Membranes                          Head Non traumatic, neck supple                      Poor Dentition 4. SKIN:  decreased Skin turgor,  Skin clean Dry and intact no rash 5. Heart: Regular rate and rhythm no  Murmur, no Rub or gallop 6. Lungs:  no wheezes or crackles   7. Abdomen: Soft,  non-tender,  distended   8. Lower extremities: no clubbing, cyanosis, no   edema 9. Neurologically diminished strength through out with contractures 10. MSK: Normal range of motion    Chart has been reviewed  ______________________________________________________________________________________________  Assessment/Plan 45 y.o. male with medical history significant of  MS with quadriparesis sp suprapubic catheter     Admitted for sepsis due to UTI  Present on Admission:   Sepsis ([place]) -  -SIRS criteria met with  elevated white blood cell count,       Component Value Date/Time   WBC 17.3 (H) 08/10/2020 1742   LYMPHSABS 0.7 08/10/2020 1742     tachycardia    RR >20 Today's Vitals   08/10/20 2100 08/10/20 2115 08/10/20 2130 08/10/20 2145  BP: 96/65 100/64 105/60 97/64  Pulse: (!) 124 84 (!) 124 (!) 125  Resp: 19 (!) 21 (!) 23 (!) 0  Temp:      TempSrc:      SpO2: 97% 95% 97% 98%  Weight:        This patient meets SIRS Criteria and may be septic.   The recent clinical data is shown below. Vitals:  08/10/20 2100 08/10/20 2115 08/10/20 2130 08/10/20 2145  BP: 96/65 100/64 105/60 97/64  Pulse: (!) 124 84 (!) 124 (!) 125  Resp: 19 (!) 21 (!) 23 (!) 0  Temp:      TempSrc:      SpO2: 97% 95% 97% 98%  Weight:        -Most likely source being:  urinary,     Patient meeting criteria for Severe sepsis with    evidence of end organ damage/organ dysfunction  such as   acute metabolic encephalopathy      - Obtain serial lactic acid and procalcitonin level.  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 PT OT assessment prior to discharge   Multiple sclerosis ([place]) -chronic multiple sclerosis supportive management   Acute metabolic encephalopathy -most likely in the setting of sepsis treat underlying infection continue to follow   Hyponatremia -obtain urine electrolytes and rehydrate and follow sodium levels   Hypokalemia - - will replace and repeat in AM,  check magnesium level and replace as needed   Seizure DO - continue Keppra  DM2 -  - Order Sensitive SSI   - continue home insulin regimen    -  check TSH and HgA1C  - Hold by mouth medications      Hypertnsion - hold clonidine given soft bp   Other plan as per orders.  DVT prophylaxis:   Lovenox       Code Status:  limited code, no CPR or intubation of to shock, pressors and BiPAP as per family  I had personally discussed CODE STATUS with  family      Family Communication:   Family  at  Bedside  plan of care was discussed   with  Wife,     Disposition Plan:     To home once workup is complete and patient is stable   Following barriers for discharge:                             Electrolytes corrected                               Anemia stable                                                         Afebrile, white count improving able to transition to PO antibiotics                                                Would benefit from PT/OT eval prior to DC   Ordered  Swallow eval - SLP ordered                                      Transition of care consulted                   Nutrition    consulted                  Wound care  consulted                   Palliative care    consulted                                   Consults called: none  Admission status:  ED Disposition     ED Disposition  Admit   Condition  --   [place]: [place] [[id number]]  Level of Care: Progressive [102]  Admit to Progressive based on following criteria: CARDIOVASCULAR & THORACIC of moderate stability with acute coronary syndrome symptoms/low risk myocardial infarction/hypertensive urgency/arrhythmias/heart failure potentially compromising stability and stable post cardiovascular intervention patients.  May admit patient to [facility] or [facility] if equivalent level of care is available:: No  Covid Evaluation: Confirmed COVID Negative  Diagnosis: Femur fracture [facility]) [[id number]]  Admitting Physician: Toy Baker [[id number]]  Attending Physician: Toy Baker [[id number]]  Estimated length of stay: past midnight tomorrow  Certification:: I certify this patient will need inpatient services for at least 2 midnights          inpatient     I Expect 2 midnight stay secondary to severity of patient's current illness need for inpatient interventions justified by the following:  hemodynamic instability despite optimal treatment (tachycardia  hypotension tachypnea  )   Severe lab/radiological/exam abnormalities including:    sepsis and extensive comorbidities including: MS chronic quadroplegia  That are currently affecting medical management.   I expect  patient to be hospitalized for 2 midnights requiring inpatient medical care.  Patient is at high risk for adverse outcome (such as loss of life or disability) if not treated.  Indication for inpatient stay as follows:  Severe change from baseline  regarding mental status Hemodynamic instability despite maximal medical therapy,    inability to maintain oral hydration     Need for IV antibiotics, IV fluids,     Level of care   progressive tele indefinitely please discontinue once patient no longer qualifies COVID-19 Labs    Lab Results  Component Value Date   [place] NEGATIVE 08/10/2020     Precautions: admitted as Covid Negative   PPE: Used by the provider:   N95  eye Goggles,  Gloves     Kristyne Woodring 08/10/2020, 10:32 PM    [facility]     after 2 AM please page floor coverage PA If 7AM-7PM, please contact the day team taking care of the patient using [email or web address]   Patient was evaluated in the context of the global COVID-19 pandemic, which necessitated consideration that the patient might be at risk for infection with the SARS-CoV-2 virus that causes COVID-19. Institutional protocols and algorithms that pertain to the evaluation of patients at risk for COVID-19 are in a state of rapid change based on information released by regulatory bodies including the CDC and federal and state organizations. These policies  and algorithms were followed during the patient's care.

## 2020-08-10 NOTE — Sepsis Progress Note (Signed)
Sepsis protocol is being followed by [facility]. 

## 2020-08-10 NOTE — ED Provider Notes (Addendum)
[facility] EMERGENCY DEPARTMENT Provider Note   CSN: [id number] Arrival date & time: 08/10/20  1541     History No chief complaint on file.   Terry Palmer is a 45 y.o. male.  HPI Terry Palmer is a 45 y.o. male with medical history significant of advanced multiple sclerosis, nonverbal at baseline, quadriplegic with contractures, neurogenic bladder with chronic suprapubic catheter, dysphagia with chronic indwelling PEG tube, dementia, history of seizures presented to the ED via EMS due to possible sepsis.  Patient has a PEG tube as well as a suprapubic catheter.  History of recurrent UTIs with multiple admissions for sepsis.  Patient is nonverbal and presents tachycardic with a rectal temperature of 100.5 F.  Code sepsis initiated.  Catheter bag with about 200 cc of dark yellow urine.  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   MS (multiple sclerosis) (HCC)    Neurogenic bladder    Neuromuscular disorder (HCC)    Quadraperesis   Normocytic anemia 05/28/2011   Quadriparesis (muscle weakness) 03/12/2011   Quadriplegia and quadriparesis ([place]) 12/24/2014   Recurrent upper respiratory infection (URI)    Recurrent UTI    Seizure disorder ([place])    Shortness of breath     Patient Active Problem List   Diagnosis Date Noted   Pressure injury of skin 05/20/2020   CAP (community acquired pneumonia) 05/18/2020   Osteomyelitis ([place]) 05/18/2020   Goals of care, counseling/discussion    Palliative care by specialist    DNR (do not resuscitate) discussion    Palliative care encounter    Acute metabolic encephalopathy 38/10/1749   Pneumonia due to infectious organism    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   Lactic  acidosis 04/27/2016   Acute cystitis with hematuria    Suprapubic catheter ([place])    G tube feedings ([place])    Flexion contractures    Encephalopathy    Bacteremia 05/27/2015   Sepsis ([place]) 05/25/2015   Altered mental state 05/25/2015   Altered mental status 05/25/2015   Fever, unspecified 03/04/2015   Quadriplegia and quadriparesis ([place]) 12/24/2014   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[place]) 12/24/2014   Fever    Hyperthermia, 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   Protein-calorie malnutrition, severe ([place]) 10/21/2014   UTI (urinary tract infection) 10/20/2014   Sepsis secondary to UTI ([place]) 10/20/2014   Leukocytosis 10/20/2014   Acute respiratory failure with hypoxia ([place]) 10/20/2014   Seizure disorder ([place]) 10/20/2014   Aspiration pneumonitis ([place]) 10/20/2014   Neurogenic bladder 05/28/2011   Multiple sclerosis ([place]) 04/20/2011   FTT (failure to thrive) in adult 04/20/2011   Sacral decubitus ulcer, stage II ([place]) 04/20/2011   Quadriparesis (muscle weakness) 03/12/2011    Past Surgical History:  Procedure Laterality Date   GASTROSTOMY  04/16/2011   Procedure: GASTROSTOMY;  Surgeon: Zenovia Jarred, MD;  Location: [facility];  Service: General;  Laterality: N/A;  Open G-Tube placement   LUMBAR PUNCTURE  10/12/2002   SUPRAPUBIC CYSTOSTOMY         Family History  Problem Relation Age of Onset   Asthma Mother     Social History   Tobacco Use   Smoking status: Former    Types: Cigarettes    Quit date:  02/02/1999    Years since quitting: 21.5   Smokeless tobacco: Former  Substance Use Topics   Alcohol use: No    Home Medications Prior to Admission medications   Medication Sig Start Date End Date Taking? Authorizing Provider  Accu-Chek Softclix Lancets lancets use as directed to check blood sugar up to 4 times daily 06/02/20   Mariel Aloe, MD  acetaminophen (TYLENOL) 500 MG tablet Take 1 tablet (500 mg total) by mouth every 8 (eight) hours as needed for fever (or pain). Patient taking  differently: Take 500 mg by mouth 2 (two) times daily. 03/05/15   Debbe Odea, MD  ALPRAZolam Duanne Moron) 0.25 MG tablet From 5/26 to 5/30 Give 0.38m per tube two times daily; then from 5/31 to 6/6 decrease to 0.293mper tube at bedtime. Then on 6/7 to 6/11, decrease to 0.2565mer tube at bedtime every OTHER day. Then STOP 06/02/20   NetMariel AloeD  AZO-CRANBERRY PO Take 1 tablet by mouth 2 (two) times daily.    [provider]  baclofen (LIORESAL) 20 MG tablet Place 1 tablet (20 mg total) into feeding tube 4 (four) times daily. 07/13/15   WilKathrynn DuckingD  bisacodyl (DULCOLAX) 10 MG suppository Place 10 mg rectally once as needed for moderate constipation.    [provider]  Blood Glucose Monitoring Suppl (ACCU-CHEK GUIDE) w/Device KIT 1 each by Does not apply route as needed. 06/02/20   NetMariel AloeD  buPROPion (WELLBUTRIN) 75 MG tablet Place 1 tablet (75 mg total) into feeding tube 3 (three) times daily. 01/24/13   KriBonnielee HaffD  cloNIDine (CATAPRES) 0.1 MG tablet Place 1 tablet (0.1 mg total) into feeding tube 3 (three) times daily. 06/05/20 09/03/20  NetMariel AloeD  Cobalamin Combinations (OPURITY B12/FOLIC ACID) 1009211-941G TABS Take 0.5 tablets by mouth daily.    [provider]  dantrolene (DANTRIUM) 50 MG capsule Give 50 mg by tube 4 (four) times daily. At 0800, 1200, 1600, 2000    [provider]  diazepam (VALIUM) 5 MG tablet Place 1 tablet (5 mg total) into feeding tube at bedtime as needed for anxiety (sleep). Hold until completion of Ativan 06/05/20   NetMariel AloeD  famotidine (PEPCID) 40 MG/5ML suspension Take 20 mg by mouth daily. 04/28/20   [provider]  glucose blood (ACCU-CHEK GUIDE) test strip Use to check blood sugar up to four times daily 06/02/20   NetMariel AloeD  guaifenesin (ROBITUSSIN) 100 MG/5ML syrup Take 200 mg by mouth 3 (three) times daily as needed for cough.    [provider]   HYDROcodone-acetaminophen (NORCO) 7.5-325 MG per tablet Place 1 tablet into feeding tube every 6 (six) hours as needed (pain).     [provider]  insulin detemir (LEVEMIR) 100 UNIT/ML injection Inject 0.22 mLs (22 Units total) into the skin daily. 06/06/20 09/04/20  NetMariel AloeD  Insulin Syringe-Needle U-100 (INSULIN SYRINGE .3CC/31GX5/16") 31G X 5/16" 0.3 ML MISC Use with Levemir prescription. 06/05/20   NetMariel AloeD  ipratropium-albuterol (DUONEB) 0.5-2.5 (3) MG/3ML SOLN Take 3 mLs by nebulization See admin instructions. Four times daily and as needed for shortness of breath    [provider]  levETIRAcetam (KEPPRA) 100 MG/ML solution Take 1,200 mg by mouth in the morning, at noon, and at bedtime. Wife gives differently:  62m60mD (= 1000mg43m dose)    [provider]  Multiple Vitamin (MULTIVITAMIN WITH MINERALS)  TABS tablet Place 1 tablet into feeding tube daily. 01/24/13   Bonnielee Haff, MD  nutrition supplement, JUVEN, Fanny Dance) PACK Place 1 packet into feeding tube 2 (two) times daily between meals. 06/05/20 09/03/20  Mariel Aloe, MD  Nutritional Supplements (FEEDING SUPPLEMENT, GLUCERNA 1.5 CAL,) LIQD Place 1,280 mLs into feeding tube daily. 06/06/20 09/04/20  Mariel Aloe, MD  Nutritional Supplements (FEEDING SUPPLEMENT, PROSOURCE TF,) liquid Place 45 mLs into feeding tube 3 (three) times daily. 06/05/20 09/03/20  Mariel Aloe, MD  omeprazole (PRILOSEC) 2 mg/mL SUSP Place 20 mLs (40 mg total) into feeding tube daily. 01/24/13   Bonnielee Haff, MD  polyethylene glycol [facility] / Floria Raveling) packet Place 17 g into feeding tube daily. Patient taking differently: Place 17 g into feeding tube every evening. 01/24/13   Bonnielee Haff, MD  polyvinyl alcohol (LIQUIFILM TEARS) 1.4 % ophthalmic solution Place 1 drop into both eyes 3 (three) times daily. Patient taking differently: Place 1 drop into both eyes 2 (two) times daily. 01/24/13   Bonnielee Haff, MD   tiZANidine (ZANAFLEX) 2 MG tablet Take 1 tablet (2 mg total) by mouth every 6 (six) hours as needed for muscle spasms. Patient taking differently: Place 2 mg into feeding tube 3 (three) times daily. 11/25/15   Kathrynn Ducking, MD  vitamin C (ASCORBIC ACID) 500 MG tablet Place 500 mg into feeding tube daily.     [provider]  Water For Irrigation, Sterile (FREE WATER) SOLN Place 240 mLs into feeding tube every 4 (four) hours. 01/24/13   Bonnielee Haff, MD    Allergies    Patient has no known allergies.  Review of Systems   Review of Systems  Unable to perform ROS: Patient nonverbal   Physical Exam Updated Vital Signs BP 96/65   Pulse (!) 124   Temp 99.9 F (37.7 C) (Axillary)   Resp 19   Wt 83.9 kg   SpO2 97%   BMI 22.51 kg/m   Physical Exam Vitals and nursing note reviewed.  Constitutional:      General: He is not in acute distress.    Appearance: He is not ill-appearing, toxic-appearing or diaphoretic.  HENT:     Head: Normocephalic and atraumatic.     Right Ear: External ear normal.     Left Ear: External ear normal.     Nose: Nose normal.     Mouth/Throat:     Mouth: Mucous membranes are moist.     Pharynx: Oropharynx is clear. No oropharyngeal exudate or posterior oropharyngeal erythema.  Eyes:     General: No scleral icterus.       Right eye: No discharge.        Left eye: No discharge.     Extraocular Movements: Extraocular movements intact.     Conjunctiva/sclera: Conjunctivae normal.  Cardiovascular:     Rate and Rhythm: Regular rhythm. Tachycardia present.     Pulses: Normal pulses.     Heart sounds: Normal heart sounds. No murmur heard.   No friction rub. No gallop.  Pulmonary:     Effort: Pulmonary effort is normal. No respiratory distress.     Breath sounds: Normal breath sounds. No stridor. No wheezing, rhonchi or rales.  Abdominal:     General: Abdomen is flat.     Palpations: Abdomen is soft.     Comments: Protuberant abdomen that  is soft.  Suprapubic Catheter noted.  No erythema around the catheter site.  200 cc of dark yellow urine noted in  the catheter bag.  PEG tube in place.  No erythema surrounding the PEG tube site.  Musculoskeletal:        General: Normal range of motion.     Cervical back: Normal range of motion and neck supple. No tenderness.  Skin:    General: Skin is warm and dry.  Neurological:     Comments: History of advanced multiple sclerosis.  All 4 extremities are in forced contracture.  Patient nonverbal.    ED Results / Procedures / Treatments   Labs (all labs ordered are listed, but only abnormal results are displayed) Labs Reviewed  LACTIC ACID, PLASMA - Abnormal; Notable for the following components:      Result Value   Lactic Acid, Venous 2.4 (*)    All other components within normal limits  CBC WITH DIFFERENTIAL/PLATELET - Abnormal; Notable for the following components:   WBC 17.3 (*)    RBC 4.10 (*)    Hemoglobin 10.7 (*)    HCT 33.6 (*)    RDW 15.9 (*)    Platelets 418 (*)    Neutro Abs 15.7 (*)    Abs Immature Granulocytes 0.26 (*)    All other components within normal limits  URINALYSIS, ROUTINE W REFLEX MICROSCOPIC - Abnormal; Notable for the following components:   Color, Urine AMBER (*)    APPearance CLOUDY (*)    Hgb urine dipstick SMALL (*)    Protein, ur 30 (*)    Leukocytes,Ua LARGE (*)    Bacteria, UA MANY (*)    All other components within normal limits  COMPREHENSIVE METABOLIC PANEL - Abnormal; Notable for the following components:   Sodium 131 (*)    Potassium 2.9 (*)    Chloride 97 (*)    Glucose, Bld 144 (*)    Creatinine, Ser 0.37 (*)    Calcium 8.0 (*)    Total Protein 6.0 (*)    Albumin 1.7 (*)    All other components within normal limits  PROTIME-INR - Abnormal; Notable for the following components:   Prothrombin Time 15.4 (*)    All other components within normal limits  RESP PANEL BY RT-PCR (FLU A&B, COVID) ARPGX2  URINE CULTURE  CULTURE, BLOOD  (ROUTINE X 2)  CULTURE, BLOOD (ROUTINE X 2)  LACTIC ACID, PLASMA  APTT  MISCELLANEOUS GENETIC TEST  COMPREHENSIVE METABOLIC PANEL  MAGNESIUM   EKG None  Radiology DG Chest Port 1 View  Result Date: 08/10/2020 CLINICAL DATA:  Questionable sepsis. EXAM: PORTABLE CHEST 1 VIEW.  Patient is rotated. COMPARISON:  Chest x-ray 07/16/2020, chest x-ray 10/28/2012 FINDINGS: The heart size and mediastinal contours are unchanged. Persistent elevation of left hemidiaphragm. Low lung volumes. Left base linear atelectasis versus scarring. No focal consolidation. No pulmonary edema. No pleural effusion. No pneumothorax. No acute osseous abnormality. IMPRESSION: No active disease. Electronically Signed   By: Iven Finn M.D.   On: 08/10/2020 17:06    Procedures .Critical Care  Date/Time: 08/10/2020 9:26 PM Performed by: Rayna Sexton, PA-C Authorized by: Rayna Sexton, PA-C   Critical care provider statement:    Critical care time (minutes):  45   Critical care was necessary to treat or prevent imminent or life-threatening deterioration of the following conditions:  Sepsis   Critical care was time spent personally by me on the following activities:  Discussions with consultants, evaluation of patient's response to treatment, examination of patient, ordering and performing treatments and interventions, ordering and review of laboratory studies, ordering and review of radiographic studies, pulse  oximetry, re-evaluation of patient's condition, obtaining history from patient or surrogate and review of old charts   Medications Ordered in ED Medications  potassium chloride 10 mEq in 100 mL IVPB (has no administration in time range)  lactated ringers bolus 1,000 mL (0 mLs Intravenous Stopped 08/10/20 1951)  ceFEPIme (MAXIPIME) 2 g in sodium chloride 0.9 % 100 mL IVPB (0 g Intravenous Stopped 08/10/20 1911)  metroNIDAZOLE (FLAGYL) IVPB 500 mg (0 mg Intravenous Stopped 08/10/20 1939)  vancomycin (VANCOREADY)  IVPB 1750 mg/350 mL (0 mg Intravenous Stopped 08/10/20 2039)  acetaminophen (TYLENOL) suppository 650 mg (650 mg Rectal Given 08/10/20 1919)  lactated ringers bolus 1,500 mL (0 mLs Intravenous Stopped 08/10/20 2031)    ED Course  I have reviewed the triage vital signs and the nursing notes.  Pertinent labs & imaging results that were available during my care of the patient were reviewed by me and considered in my medical decision making (see chart for details).  Clinical Course as of 08/10/20 2127  Wed Aug 10, 2020  1740 Temp(!): 100.5 F (38.1 C) rectal [LJ]    Clinical Course User Index [LJ] Rayna Sexton, PA-C   MDM Rules/Calculators/A&P                          Pt is a 45 y.o. male who presents to the emergency department due to what appears to be sepsis secondary to a likely UTI.  Labs: CBC with a white blood cell count of 17.3, neutrophils of 15.7, absolute immature granulocytes of 0.26. CMP with a sodium of 131, potassium of 2.9, chloride of 97, glucose of 144, creatinine of 0.37, calcium of 8, total protein of 6, albumin of 1.7. Lactic acid of 2.4 with a repeat of 1.4. UA showing small hemoglobin, 30 protein, large leukocytes, many bacteria, 21-50 white blood cells. Blood cultures obtained.  Imaging: Chest x-ray shows no active disease.  I, Rayna Sexton, PA-C, personally reviewed and evaluated these images and lab results as part of my medical decision-making.  Patient presented septic upon arrival.  Code sepsis was initiated.  Patient given IV fluid bolus, broad-spectrum antibiotics, as well as antipyretics.  Heart rate and temperature have improved.  UA concerning for likely urosepsis.  Patient also found to be hypokalemia at 2.9.  Magnesium level is pending.  We will start on IV potassium.  Given patient's septic presentation as well as his complicated medical history he will require admission for further work-up.  Will discuss with the medicine team.  Note:  Portions of this report may have been transcribed using voice recognition software. Every effort was made to ensure accuracy; however, inadvertent computerized transcription errors may be present.   Final Clinical Impression(s) / ED Diagnoses Final diagnoses:  Sepsis, due to unspecified organism, unspecified whether acute organ dysfunction present [facility])  Hypokalemia  Acute cystitis with hematuria    Rx / DC Orders ED Discharge Orders     None        Rayna Sexton, PA-C 08/10/20 2127    Rayna Sexton, PA-C 08/10/20 2128    Margette Fast, MD 08/11/20 1118

## 2020-08-11 DIAGNOSIS — E876 Hypokalemia: Secondary | ICD-10-CM | POA: Diagnosis not present

## 2020-08-11 DIAGNOSIS — L89152 Pressure ulcer of sacral region, stage 2: Secondary | ICD-10-CM

## 2020-08-11 DIAGNOSIS — G8250 Quadriplegia, unspecified: Secondary | ICD-10-CM

## 2020-08-11 DIAGNOSIS — G35 Multiple sclerosis: Secondary | ICD-10-CM | POA: Diagnosis not present

## 2020-08-11 DIAGNOSIS — G9341 Metabolic encephalopathy: Secondary | ICD-10-CM | POA: Diagnosis not present

## 2020-08-11 DIAGNOSIS — R7881 Bacteremia: Secondary | ICD-10-CM

## 2020-08-11 DIAGNOSIS — B9561 Methicillin susceptible Staphylococcus aureus infection as the cause of diseases classified elsewhere: Secondary | ICD-10-CM

## 2020-08-11 LAB — CBC WITH DIFFERENTIAL/PLATELET
Abs Immature Granulocytes: 0.22 10*3/uL — ABNORMAL HIGH (ref 0.00–0.07)
Basophils Absolute: 0 10*3/uL (ref 0.0–0.1)
Basophils Relative: 0 %
Eosinophils Absolute: 0 10*3/uL (ref 0.0–0.5)
Eosinophils Relative: 0 %
HCT: 28.4 % — ABNORMAL LOW (ref 39.0–52.0)
Hemoglobin: 9 g/dL — ABNORMAL LOW (ref 13.0–17.0)
Immature Granulocytes: 1 %
Lymphocytes Relative: 9 %
Lymphs Abs: 1.6 10*3/uL (ref 0.7–4.0)
MCH: 25.9 pg — ABNORMAL LOW (ref 26.0–34.0)
MCHC: 31.7 g/dL (ref 30.0–36.0)
MCV: 81.8 fL (ref 80.0–100.0)
Monocytes Absolute: 1.1 10*3/uL — ABNORMAL HIGH (ref 0.1–1.0)
Monocytes Relative: 6 %
Neutro Abs: 14.8 10*3/uL — ABNORMAL HIGH (ref 1.7–7.7)
Neutrophils Relative %: 84 %
Platelets: 370 10*3/uL (ref 150–400)
RBC: 3.47 MIL/uL — ABNORMAL LOW (ref 4.22–5.81)
RDW: 15.6 % — ABNORMAL HIGH (ref 11.5–15.5)
WBC: 17.7 10*3/uL — ABNORMAL HIGH (ref 4.0–10.5)
nRBC: 0 % (ref 0.0–0.2)

## 2020-08-11 LAB — COMPREHENSIVE METABOLIC PANEL
ALT: 24 U/L (ref 0–44)
AST: 28 U/L (ref 15–41)
Albumin: 1.6 g/dL — ABNORMAL LOW (ref 3.5–5.0)
Alkaline Phosphatase: 116 U/L (ref 38–126)
Anion gap: 11 (ref 5–15)
BUN: 12 mg/dL (ref 6–20)
CO2: 21 mmol/L — ABNORMAL LOW (ref 22–32)
Calcium: 8 mg/dL — ABNORMAL LOW (ref 8.9–10.3)
Chloride: 102 mmol/L (ref 98–111)
Creatinine, Ser: 0.32 mg/dL — ABNORMAL LOW (ref 0.61–1.24)
GFR, Estimated: >60 mL/min (ref >60)
Glucose, Bld: 92 mg/dL (ref 70–99)
Potassium: 2.9 mmol/L — ABNORMAL LOW (ref 3.5–5.1)
Sodium: 134 mmol/L — ABNORMAL LOW (ref 135–145)
Total Bilirubin: 0.5 mg/dL (ref 0.3–1.2)
Total Protein: 5.9 g/dL — ABNORMAL LOW (ref 6.5–8.1)

## 2020-08-11 LAB — BLOOD CULTURE ID PANEL (REFLEXED) - BCID2

## 2020-08-11 LAB — CREATININE, URINE, RANDOM: Creatinine, Urine: 50.16 mg/dL

## 2020-08-11 LAB — GLUCOSE, CAPILLARY
Glucose-Capillary: 81 mg/dL (ref 70–99)
Glucose-Capillary: 129 mg/dL — ABNORMAL HIGH (ref 70–99)
Glucose-Capillary: 126 mg/dL — ABNORMAL HIGH (ref 70–99)

## 2020-08-11 LAB — CBG MONITORING, ED
Glucose-Capillary: 101 mg/dL — ABNORMAL HIGH (ref 70–99)
Glucose-Capillary: 86 mg/dL (ref 70–99)
Glucose-Capillary: 83 mg/dL (ref 70–99)
Glucose-Capillary: 96 mg/dL (ref 70–99)

## 2020-08-11 LAB — SURGICAL PCR SCREEN
MRSA, PCR: NEGATIVE
Staphylococcus aureus: POSITIVE — AB

## 2020-08-11 LAB — FERRITIN: Ferritin: 1102 ng/mL — ABNORMAL HIGH (ref 24–336)

## 2020-08-11 LAB — RETICULOCYTES
Immature Retic Fract: 7 % (ref 2.3–15.9)
RBC.: 3.38 MIL/uL — ABNORMAL LOW (ref 4.22–5.81)
Retic Count, Absolute: 13.9 10*3/uL — ABNORMAL LOW (ref 19.0–186.0)
Retic Ct Pct: 0.4 % (ref 0.4–3.1)

## 2020-08-11 LAB — FOLATE: Folate: 18.3 ng/mL (ref >5.9)

## 2020-08-11 LAB — IRON AND TIBC
Iron: 8 ug/dL — ABNORMAL LOW (ref 45–182)
Saturation Ratios: 5 % — ABNORMAL LOW (ref 17.9–39.5)
TIBC: 167 ug/dL — ABNORMAL LOW (ref 250–450)
UIBC: 159 ug/dL

## 2020-08-11 LAB — OSMOLALITY: Osmolality: 277 mOsm/kg (ref 275–295)

## 2020-08-11 LAB — VITAMIN B12: Vitamin B-12: 1081 pg/mL — ABNORMAL HIGH (ref 180–914)

## 2020-08-11 LAB — SODIUM, URINE, RANDOM: Sodium, Ur: <10 mmol/L

## 2020-08-11 LAB — MAGNESIUM: Magnesium: 2.1 mg/dL (ref 1.7–2.4)

## 2020-08-11 LAB — TSH: TSH: 0.973 u[IU]/mL (ref 0.350–4.500)

## 2020-08-11 LAB — OSMOLALITY, URINE: Osmolality, Ur: 773 mOsm/kg (ref 300–900)

## 2020-08-11 LAB — PREALBUMIN: Prealbumin: 7.5 mg/dL — ABNORMAL LOW (ref 18–38)

## 2020-08-11 LAB — PHOSPHORUS: Phosphorus: 3.1 mg/dL (ref 2.5–4.6)

## 2020-08-11 MED ORDER — ORAL CARE MOUTH RINSE
15.0000 mL | Freq: Two times a day (BID) | OROMUCOSAL | Status: DC
  Administered 2020-08-11 – 2020-08-16 (×11): 15 mL via OROMUCOSAL

## 2020-08-11 MED ORDER — IPRATROPIUM-ALBUTEROL 0.5-2.5 (3) MG/3ML IN SOLN: 3.0000 mL | Freq: Four times a day (QID) | RESPIRATORY_TRACT | Status: DC | PRN

## 2020-08-11 MED ORDER — CHLORHEXIDINE GLUCONATE 0.12 % MT SOLN
15.0000 mL | Freq: Two times a day (BID) | OROMUCOSAL | Status: DC
  Administered 2020-08-11 – 2020-08-16 (×11): 15 mL via OROMUCOSAL
  Filled 2020-08-11 (×9): qty 15

## 2020-08-11 MED ORDER — IBUPROFEN 100 MG/5ML PO SUSP
400.0000 mg | Freq: Three times a day (TID) | ORAL | Status: AC | PRN
  Administered 2020-08-12 – 2020-08-13 (×2): 400 mg
  Filled 2020-08-11 (×4): qty 20

## 2020-08-11 MED ORDER — POTASSIUM CHLORIDE 20 MEQ PO PACK
40.0000 meq | PACK | Freq: Two times a day (BID) | ORAL | Status: AC
  Administered 2020-08-11 (×2): 40 meq
  Filled 2020-08-11 (×2): qty 2

## 2020-08-11 MED ORDER — GLUCERNA 1.5 CAL PO LIQD
1000.0000 mL | ORAL | Status: DC
  Administered 2020-08-11: 1000 mL
  Filled 2020-08-11 (×4): qty 1000

## 2020-08-11 MED ORDER — INSULIN DETEMIR 100 UNIT/ML ~~LOC~~ SOLN
10.0000 [IU] | Freq: Every day | SUBCUTANEOUS | Status: DC
  Administered 2020-08-12 – 2020-08-16 (×5): 10 [IU] via SUBCUTANEOUS
  Filled 2020-08-11 (×8): qty 0.1

## 2020-08-11 MED ORDER — SODIUM CHLORIDE 0.9 % IV BOLUS
500.0000 mL | Freq: Once | INTRAVENOUS | Status: AC
  Administered 2020-08-11: 500 mL via INTRAVENOUS

## 2020-08-11 MED ORDER — ACETAMINOPHEN 650 MG RE SUPP
650.0000 mg | Freq: Four times a day (QID) | RECTAL | Status: DC | PRN
  Administered 2020-08-11: 650 mg via RECTAL

## 2020-08-11 MED ORDER — POTASSIUM CHLORIDE CRYS ER 20 MEQ PO TBCR: 40.0000 meq | EXTENDED_RELEASE_TABLET | ORAL | Status: DC

## 2020-08-11 MED ORDER — IPRATROPIUM-ALBUTEROL 0.5-2.5 (3) MG/3ML IN SOLN
3.0000 mL | Freq: Four times a day (QID) | RESPIRATORY_TRACT | Status: DC
  Administered 2020-08-11: 3 mL via RESPIRATORY_TRACT
  Filled 2020-08-11: qty 3

## 2020-08-11 MED ORDER — POTASSIUM CHLORIDE CRYS ER 20 MEQ PO TBCR
40.0000 meq | EXTENDED_RELEASE_TABLET | ORAL | Status: AC
Start: 2020-08-11 — End: 2020-08-11

## 2020-08-11 MED ORDER — ACETAMINOPHEN 160 MG/5ML PO SOLN
650.0000 mg | Freq: Four times a day (QID) | ORAL | Status: DC | PRN
  Administered 2020-08-12 – 2020-08-16 (×3): 650 mg
  Filled 2020-08-11 (×3): qty 20.3

## 2020-08-11 MED ORDER — MUPIROCIN 2 % EX OINT
1.0000 "application " | TOPICAL_OINTMENT | Freq: Two times a day (BID) | CUTANEOUS | Status: AC
  Administered 2020-08-11 – 2020-08-16 (×10): 1 via NASAL
  Filled 2020-08-11 (×2): qty 22

## 2020-08-11 MED ORDER — IPRATROPIUM-ALBUTEROL 0.5-2.5 (3) MG/3ML IN SOLN: 3.0000 mL | RESPIRATORY_TRACT | Status: DC | PRN

## 2020-08-11 MED ORDER — HYDROCODONE-ACETAMINOPHEN 5-325 MG PO TABS
1.0000 | ORAL_TABLET | ORAL | Status: DC | PRN
  Administered 2020-08-11 – 2020-08-12 (×6): 2
  Administered 2020-08-13: 1
  Administered 2020-08-13 – 2020-08-16 (×10): 2
  Filled 2020-08-11 (×9): qty 2
  Filled 2020-08-11: qty 1
  Filled 2020-08-11 (×8): qty 2

## 2020-08-11 MED ORDER — CHLORHEXIDINE GLUCONATE CLOTH 2 % EX PADS
6.0000 | MEDICATED_PAD | Freq: Every day | CUTANEOUS | Status: AC
  Administered 2020-08-11 – 2020-08-15 (×4): 6 via TOPICAL

## 2020-08-11 NOTE — Progress Notes (Signed)
PHARMACY - PHYSICIAN COMMUNICATION CRITICAL VALUE ALERT - BLOOD CULTURE IDENTIFICATION (BCID)  Terry Palmer is an 45 y.o. male who presented to [facility] on 08/10/2020 with a chief complaint of fever and confusion.  Assessment:  2/4 Bcx: Gram Positive  BCID detected S.aureus No resistance detected  Name of physician (or Provider) Contacted: Domenic Polite, MD  Current antibiotics:  Cefepime 2g IV q8h  Changes to prescribed antibiotics recommended:  Suggested Cefazolin for MSSA bacteremia. No new orders received.   Results for orders placed or performed during the hospital encounter of 08/10/20  Blood Culture ID Panel (Reflexed) (Collected: 08/10/2020  3:57 PM)  Result Value Ref Range   Enterococcus faecalis NOT DETECTED NOT DETECTED   Enterococcus Faecium NOT DETECTED NOT DETECTED   Listeria monocytogenes NOT DETECTED NOT DETECTED   Staphylococcus species DETECTED (A) NOT DETECTED   Staphylococcus aureus (BCID) DETECTED (A) NOT DETECTED   Staphylococcus epidermidis NOT DETECTED NOT DETECTED   Staphylococcus lugdunensis NOT DETECTED NOT DETECTED   Streptococcus species NOT DETECTED NOT DETECTED   Streptococcus agalactiae NOT DETECTED NOT DETECTED   Streptococcus pneumoniae NOT DETECTED NOT DETECTED   Streptococcus pyogenes NOT DETECTED NOT DETECTED   A.calcoaceticus-baumannii NOT DETECTED NOT DETECTED   Bacteroides fragilis NOT DETECTED NOT DETECTED   Enterobacterales NOT DETECTED NOT DETECTED   Enterobacter cloacae complex NOT DETECTED NOT DETECTED   Escherichia coli NOT DETECTED NOT DETECTED   Klebsiella aerogenes NOT DETECTED NOT DETECTED   Klebsiella oxytoca NOT DETECTED NOT DETECTED   Klebsiella pneumoniae NOT DETECTED NOT DETECTED   Proteus species NOT DETECTED NOT DETECTED   Salmonella species NOT DETECTED NOT DETECTED   Serratia marcescens NOT DETECTED NOT DETECTED   Haemophilus influenzae NOT DETECTED NOT DETECTED   Neisseria meningitidis NOT DETECTED NOT  DETECTED   Pseudomonas aeruginosa NOT DETECTED NOT DETECTED   Stenotrophomonas maltophilia NOT DETECTED NOT DETECTED   Candida albicans NOT DETECTED NOT DETECTED   Candida auris NOT DETECTED NOT DETECTED   Candida glabrata NOT DETECTED NOT DETECTED   Candida krusei NOT DETECTED NOT DETECTED   Candida parapsilosis NOT DETECTED NOT DETECTED   Candida tropicalis NOT DETECTED NOT DETECTED   Cryptococcus neoformans/gattii NOT DETECTED NOT DETECTED   Meth resistant mecA/C and MREJ NOT DETECTED NOT [place], PharmD PGY2 Infectious Diseases Pharmacy Resident   Please check [email or web address] for unit-specific pharmacy phone numbers

## 2020-08-11 NOTE — Consult Note (Signed)
[facility]  Total days of antibiotics 2               Reason for Consult: staph aureus bacteremia   Referring Physician: [email or web address]  Active Problems:   Multiple sclerosis ([place])   Sacral decubitus ulcer, stage II ([place])   UTI (urinary tract infection)   Seizure disorder ([place])   Quadriplegia and quadriparesis (HCC)   Sepsis ([place])   Suprapubic catheter ([place])   Acute metabolic encephalopathy   Femur fracture (HCC)   Hyponatremia   Hypokalemia    HPI: Terry Palmer is a 45 y.o. male with history of MS with quadriparesis/plegia, multiple contractures, neurogenic bladder s/p suprapubic catheter, admitted to ED yesterday for fever, increasing somnolence, foul smelling urine concern for urinary sepsis. In ED, fever, tachycardia, Labs showing leukocytosis of 17K, LA of 2.4,  hypokalemia. Exam is known for having sacral wound. Not having significant drainage Ua bactuiria. Infectious work up - blood cx +MSSA. He was started on cefepime nad vancomycin. Cxr not suggestive of infiltrate per my read.   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   MS (multiple sclerosis) (HCC)    Neurogenic bladder    Neuromuscular disorder (HCC)    Quadraperesis   Normocytic anemia 05/28/2011   Quadriparesis (muscle weakness) 03/12/2011   Quadriplegia and quadriparesis ([place]) 12/24/2014   Recurrent upper respiratory infection (URI)    Recurrent UTI    Seizure disorder (HCC)    Shortness of breath     Allergies: No Known Allergies   MEDICATIONS:  baclofen  20 mg Per Tube QID   buPROPion  75 mg Per Tube TID   chlorhexidine  15 mL Mouth Rinse BID   dantrolene  50 mg Per J Tube QID   enoxaparin (LOVENOX) injection  40 mg Subcutaneous Q24H   famotidine  20 mg Per Tube Daily   feeding  supplement (PROSource TF)  45 mL Per Tube TID   free water  240 mL Per Tube Q4H   insulin aspart  0-9 Units Subcutaneous Q4H   insulin detemir  10 Units Subcutaneous Daily   levETIRAcetam  1,200 mg Per Tube TID   mouth rinse  15 mL Mouth Rinse q12n4p   mupirocin ointment  1 application Nasal BID   polyvinyl alcohol  1 drop Both Eyes BID   potassium chloride  40 mEq Per Tube BID   tiZANidine  2 mg Per Tube TID    Social History   Tobacco Use   Smoking status: Former    Types: Cigarettes    Quit date: 02/02/1999    Years since quitting: 21.5   Smokeless tobacco: Former  Substance Use Topics   Alcohol use: No    Family History  Problem Relation Age of Onset   Asthma Mother     Review of Systems - non verbal patient. Unable to obtain. See hpi   OBJECTIVE: Temp:  [99 F (37.2 C)-103.2 F (39.6 C)] 99 F (37.2 C) (08/04 1544) Pulse Rate:  [84-160] 118 (08/04 1558) Resp:  [0-32] 24 (08/04 1558) BP: (96-168)/(60-89) 138/87 (08/04 1544) SpO2:  [92 %-100 %] 100 % (08/04 1558) FiO2 (%):  [28 %] 28 % (08/04 1558) Physical Exam  Constitutional:  He appears chronically ill and under-nourished,  contracture from MS HENT:  Mouth/Throat: Oropharynx is clear and moist. No oropharyngeal exudate.  Cardiovascular: Normal rate, regular rhythm and normal heart sounds. Exam reveals no gallop and no friction rub.  No murmur heard.  Pulmonary/Chest: Effort normal and breath sounds normal. No respiratory distress. He has no wheezes.  Abdominal: Soft. Bowel sounds are normal. He exhibits no distension. There is no tenderness.   Neurological: contracture of arms and legs ; (right leg over the left - P shape) and muscle wasting Skin: Skin is warm and dry. No rash noted. No erythema.     LABS: Results for orders placed or performed during the hospital encounter of 08/10/20 (from the past 48 hour(s))  Urine Culture     Status: Abnormal (Preliminary result)   Collection Time: 08/10/20  3:52 PM    Specimen: In/Out Cath Urine  Result Value Ref Range   Specimen Description IN/OUT CATH URINE    Special Requests NONE    Culture (A)     70,000 COLONIES/mL PROTEUS MIRABILIS SUSCEPTIBILITIES TO FOLLOW Performed at [facility], [place] [address] [place], [place] [address]    Report Status PENDING   Blood Culture (routine x 2)     Status: None (Preliminary result)   Collection Time: 08/10/20  3:52 PM   Specimen: BLOOD RIGHT ARM  Result Value Ref Range   Specimen Description BLOOD RIGHT ARM    Special Requests      BOTTLES DRAWN AEROBIC AND ANAEROBIC Blood Culture adequate volume   Culture  Setup Time      GRAM POSITIVE COCCI IN BOTH AEROBIC AND ANAEROBIC BOTTLES CRITICAL VALUE NOTED.  VALUE IS CONSISTENT WITH PREVIOUSLY REPORTED AND CALLED VALUE. Performed at [facility], [place] [address], [place], [place] [address]    Culture GRAM POSITIVE COCCI    Report Status PENDING   Resp Panel by RT-PCR (Flu A&B, Covid) Nasopharyngeal Swab     Status: None   Collection Time: 08/10/20  3:53 PM   Specimen: Nasopharyngeal Swab; Nasopharyngeal(NP) swabs in vial transport medium  Result Value Ref Range   SARS Coronavirus 2 by RT PCR NEGATIVE NEGATIVE    Comment: (NOTE) SARS-CoV-2 target nucleic acids are NOT DETECTED.  The SARS-CoV-2 RNA is generally detectable in upper respiratory specimens during the acute phase of infection. The lowest concentration of SARS-CoV-2 viral copies this assay can detect is 138 copies/mL. A negative result does not preclude SARS-Cov-2 infection and should not be used as the sole basis for treatment or other patient management decisions. A negative result may occur with  improper specimen collection/handling, submission of specimen other than nasopharyngeal swab, presence of viral mutation(s) within the areas targeted by this assay, and inadequate number of viral copies(<138 copies/mL). A negative result must be combined with clinical observations,  patient history, and epidemiological information. The expected result is Negative.  Fact Sheet for Patients:  EntrepreneurPulse.com.au  Fact Sheet for Healthcare Providers:  IncredibleEmployment.be  This test is no t yet approved or cleared by the [place] FDA and  has been authorized for detection and/or diagnosis of SARS-CoV-2 by FDA under an Emergency Use Authorization (EUA). This EUA will remain  in effect (meaning this test can be used) for the duration of the COVID-19 declaration under Section 564(b)(1) of the Act, 21 U.S.C.section 360bbb-3(b)(1), unless the authorization is terminated  or revoked sooner.       Influenza A by PCR NEGATIVE NEGATIVE   Influenza B by PCR NEGATIVE NEGATIVE  Comment: (NOTE) The Xpert Xpress SARS-CoV-2/FLU/RSV plus assay is intended as an aid in the diagnosis of influenza from Nasopharyngeal swab specimens and should not be used as a sole basis for treatment. Nasal washings and aspirates are unacceptable for Xpert Xpress SARS-CoV-2/FLU/RSV testing.  Fact Sheet for Patients: EntrepreneurPulse.com.au  Fact Sheet for Healthcare Providers: IncredibleEmployment.be  This test is not yet approved or cleared by the [place] FDA and has been authorized for detection and/or diagnosis of SARS-CoV-2 by FDA under an Emergency Use Authorization (EUA). This EUA will remain in effect (meaning this test can be used) for the duration of the COVID-19 declaration under Section 564(b)(1) of the Act, 21 U.S.C. section 360bbb-3(b)(1), unless the authorization is terminated or revoked.  Performed at [facility], [place] [address] [place], [place] [address]   Blood Culture (routine x 2)     Status: None (Preliminary result)   Collection Time: 08/10/20  3:57 PM   Specimen: BLOOD LEFT FOREARM  Result Value Ref Range   Specimen Description BLOOD LEFT FOREARM    Special  Requests      BOTTLES DRAWN AEROBIC AND ANAEROBIC Blood Culture adequate volume   Culture  Setup Time      GRAM POSITIVE COCCI IN BOTH AEROBIC AND ANAEROBIC BOTTLES Organism ID to follow CRITICAL RESULT CALLED TO, READ BACK BY AND VERIFIED WITH: A LAWLESS PHARMD [id number] 08/11/20 A BROWNING Performed at [facility], [place] [address], [place], [place] [address]    Culture PENDING    Report Status PENDING   Blood Culture ID Panel (Reflexed)     Status: Abnormal   Collection Time: 08/10/20  3:57 PM  Result Value Ref Range   Enterococcus faecalis NOT DETECTED NOT DETECTED   Enterococcus Faecium NOT DETECTED NOT DETECTED   Listeria monocytogenes NOT DETECTED NOT DETECTED   Staphylococcus species DETECTED (A) NOT DETECTED    Comment: CRITICAL RESULT CALLED TO, READ BACK BY AND VERIFIED WITH: A LAWLESS PHARMD 1358 08/11/20 A BROWNING    Staphylococcus aureus (BCID) DETECTED (A) NOT DETECTED    Comment: CRITICAL RESULT CALLED TO, READ BACK BY AND VERIFIED WITH: A LAWLESS PHARMD 1358 08/11/20 A BROWNING    Staphylococcus epidermidis NOT DETECTED NOT DETECTED   Staphylococcus lugdunensis NOT DETECTED NOT DETECTED   Streptococcus species NOT DETECTED NOT DETECTED   Streptococcus agalactiae NOT DETECTED NOT DETECTED   Streptococcus pneumoniae NOT DETECTED NOT DETECTED   Streptococcus pyogenes NOT DETECTED NOT DETECTED   A.calcoaceticus-baumannii NOT DETECTED NOT DETECTED   Bacteroides fragilis NOT DETECTED NOT DETECTED   Enterobacterales NOT DETECTED NOT DETECTED   Enterobacter cloacae complex NOT DETECTED NOT DETECTED   Escherichia coli NOT DETECTED NOT DETECTED   Klebsiella aerogenes NOT DETECTED NOT DETECTED   Klebsiella oxytoca NOT DETECTED NOT DETECTED   Klebsiella pneumoniae NOT DETECTED NOT DETECTED   Proteus species NOT DETECTED NOT DETECTED   Salmonella species NOT DETECTED NOT DETECTED   Serratia marcescens NOT DETECTED NOT DETECTED   Haemophilus influenzae NOT DETECTED NOT  DETECTED   Neisseria meningitidis NOT DETECTED NOT DETECTED   Pseudomonas aeruginosa NOT DETECTED NOT DETECTED   Stenotrophomonas maltophilia NOT DETECTED NOT DETECTED   Candida albicans NOT DETECTED NOT DETECTED   Candida auris NOT DETECTED NOT DETECTED   Candida glabrata NOT DETECTED NOT DETECTED   Candida krusei NOT DETECTED NOT DETECTED   Candida parapsilosis NOT DETECTED NOT DETECTED   Candida tropicalis NOT DETECTED NOT DETECTED   Cryptococcus neoformans/gattii NOT DETECTED NOT DETECTED  Meth resistant mecA/C and MREJ NOT DETECTED NOT DETECTED    Comment: Performed at [facility], [place] [address] [place], [place] [address]  Urinalysis, Routine w reflex microscopic     Status: Abnormal   Collection Time: 08/10/20  4:35 PM  Result Value Ref Range   Color, Urine AMBER (A) YELLOW    Comment: BIOCHEMICALS MAY BE AFFECTED BY COLOR   APPearance CLOUDY (A) CLEAR   Specific Gravity, Urine 1.024 1.005 - 1.030   pH 7.0 5.0 - 8.0   Glucose, UA NEGATIVE NEGATIVE mg/dL   Hgb urine dipstick SMALL (A) NEGATIVE   Bilirubin Urine NEGATIVE NEGATIVE   Ketones, ur NEGATIVE NEGATIVE mg/dL   Protein, ur 30 (A) NEGATIVE mg/dL   Nitrite NEGATIVE NEGATIVE   Leukocytes,Ua LARGE (A) NEGATIVE   RBC / HPF 21-50 0 - 5 RBC/hpf   WBC, UA 21-50 0 - 5 WBC/hpf   Bacteria, UA MANY (A) NONE SEEN   Squamous Epithelial / LPF 0-5 0 - 5   Mucus PRESENT    Triple Phosphate Crystal PRESENT    Ca Oxalate Crys, UA PRESENT     Comment: Performed at [facility], [address], [place], [place] [address]  Osmolality, urine     Status: None   Collection Time: 08/10/20  4:35 PM  Result Value Ref Range   Osmolality, Ur 773 300 - 900 mOsm/kg    Comment: Performed at [facility] [address] [place], [place] [address]  Creatinine, urine, random     Status: None   Collection Time: 08/10/20  4:35 PM  Result Value Ref Range   Creatinine, Urine 50.16 mg/dL    Comment: Performed at [facility], [place] [address], [place], [place] [address]  Sodium, urine, random     Status: None   Collection Time: 08/10/20  4:35 PM  Result Value Ref Range   Sodium, Ur <10 mmol/L    Comment: Performed at [facility] [address] [place], [place] [address]  Lactic acid, plasma     Status: Abnormal   Collection Time: 08/10/20  5:42 PM  Result Value Ref Range   Lactic Acid, Venous 2.4 (HH) 0.5 - 1.9 mmol/L    Comment: CRITICAL RESULT CALLED TO, READ BACK BY AND VERIFIED WITH: Shon Hale RN [phone] K FORSYTH Performed at [facility], [address] [place], [place] [address]   CBC WITH DIFFERENTIAL     Status: Abnormal   Collection Time: 08/10/20  5:42 PM  Result Value Ref Range   WBC 17.3 (H) 4.0 - 10.5 K/uL   RBC 4.10 (L) 4.22 - 5.81 MIL/uL   Hemoglobin 10.7 (L) 13.0 - 17.0 g/dL   HCT 33.6 (L) 39.0 - 52.0 %   MCV 82.0 80.0 - 100.0 fL   MCH 26.1 26.0 - 34.0 pg   MCHC 31.8 30.0 - 36.0 g/dL   RDW 15.9 (H) 11.5 - 15.5 %   Platelets 418 (H) 150 - 400 K/uL   nRBC 0.0 0.0 - 0.2 %   Neutrophils Relative % 91 %   Neutro Abs 15.7 (H) 1.7 - 7.7 K/uL   Lymphocytes Relative 4 %   Lymphs Abs 0.7 0.7 - 4.0 K/uL   Monocytes Relative 3 %   Monocytes Absolute 0.6 0.1 - 1.0 K/uL   Eosinophils Relative 0 %   Eosinophils Absolute 0.0 0.0 - 0.5 K/uL   Basophils Relative 0 %  Basophils Absolute 0.0 0.0 - 0.1 K/uL   WBC Morphology WHITE COUNT CONFIRMED ON SMEAR    RBC Morphology MORPHOLOGY UNREMARKABLE    Smear Review PLATELET COUNT CONFIRMED BY SMEAR    Immature Granulocytes 2 %   Abs Immature Granulocytes 0.26 (H) 0.00 - 0.07 K/uL    Comment: Performed at [facility] [address] [place], [place] [address]  Hemoglobin A1c     Status: Abnormal   Collection Time: 08/10/20  5:42 PM  Result Value Ref Range   Hgb A1c MFr Bld 7.1 (H) 4.8 - 5.6 %    Comment: (NOTE) Pre diabetes:          5.7%-6.4%  Diabetes:              >6.4%  Glycemic control for   <7.0% adults  with diabetes    Mean Plasma Glucose 157.07 mg/dL    Comment: Performed at [facility] [address] [place], [place] [address]  Lactic acid, plasma     Status: None   Collection Time: 08/10/20  7:52 PM  Result Value Ref Range   Lactic Acid, Venous 1.4 0.5 - 1.9 mmol/L    Comment: Performed at [facility] [address] [place], [place] [address]  Comprehensive metabolic panel     Status: Abnormal   Collection Time: 08/10/20  7:52 PM  Result Value Ref Range   Sodium 131 (L) 135 - 145 mmol/L   Potassium 2.9 (L) 3.5 - 5.1 mmol/L   Chloride 97 (L) 98 - 111 mmol/L   CO2 22 22 - 32 mmol/L   Glucose, Bld 144 (H) 70 - 99 mg/dL    Comment: Glucose reference range applies only to samples taken after fasting for at least 8 hours.   BUN 17 6 - 20 mg/dL   Creatinine, Ser 0.37 (L) 0.61 - 1.24 mg/dL   Calcium 8.0 (L) 8.9 - 10.3 mg/dL   Total Protein 6.0 (L) 6.5 - 8.1 g/dL   Albumin 1.7 (L) 3.5 - 5.0 g/dL   AST 28 15 - 41 U/L   ALT 24 0 - 44 U/L   Alkaline Phosphatase 106 38 - 126 U/L   Total Bilirubin 0.5 0.3 - 1.2 mg/dL   GFR, Estimated >60 >60 mL/min    Comment: (NOTE) Calculated using the CKD-EPI Creatinine Equation (2021)    Anion gap 12 5 - 15    Comment: Performed at [facility], [place] [address], [place], [place] [address]  Magnesium     Status: None   Collection Time: 08/10/20  7:52 PM  Result Value Ref Range   Magnesium 1.8 1.7 - 2.4 mg/dL    Comment: Performed at [facility], [place] [address] [place],  [address]  Procalcitonin     Status: None   Collection Time: 08/10/20  7:52 PM  Result Value Ref Range   Procalcitonin 5.30 ng/mL    Comment:        Interpretation: PCT > 2 ng/mL: Systemic infection (sepsis) is likely, unless other causes are known. (NOTE)       Sepsis PCT Algorithm           Lower Respiratory Tract                                      Infection PCT Algorithm    ----------------------------      ----------------------------  PCT < 0.25 ng/mL                PCT < 0.10 ng/mL          Strongly encourage             Strongly discourage   discontinuation of antibiotics    initiation of antibiotics    ----------------------------     -----------------------------       PCT 0.25 - 0.50 ng/mL            PCT 0.10 - 0.25 ng/mL               OR       >80% decrease in PCT            Discourage initiation of                                            antibiotics      Encourage discontinuation           of antibiotics    ----------------------------     -----------------------------         PCT >= 0.50 ng/mL              PCT 0.26 - 0.50 ng/mL               AND       <80% decrease in PCT              Encourage initiation of                                             antibiotics       Encourage continuation           of antibiotics    ----------------------------     -----------------------------        PCT >= 0.50 ng/mL                  PCT > 0.50 ng/mL               AND         increase in PCT                  Strongly encourage                                      initiation of antibiotics    Strongly encourage escalation           of antibiotics                                     -----------------------------                                           PCT <= 0.25 ng/mL  OR                                        > 80% decrease in PCT                                      Discontinue / Do not initiate                                             antibiotics  Performed at [facility], [place] [address], [place], [place] [address]   Phosphorus     Status: None   Collection Time: 08/10/20  7:52 PM  Result Value Ref Range   Phosphorus 3.2 2.5 - 4.6 mg/dL    Comment: Performed at [facility], [place] [address] [place], [place] [address]  Protime-INR     Status: Abnormal   Collection Time: 08/10/20  8:18 PM  Result Value  Ref Range   Prothrombin Time 15.4 (H) 11.4 - 15.2 seconds   INR 1.2 0.8 - 1.2    Comment: (NOTE) INR goal varies based on device and disease states. Performed at [facility], [place] [address] [place], [place] [address]   APTT     Status: None   Collection Time: 08/10/20  8:18 PM  Result Value Ref Range   aPTT 35 24 - 36 seconds    Comment: Performed at [facility] [address] [place], [place] [address]  CBG monitoring, ED     Status: Abnormal   Collection Time: 08/11/20  2:33 AM  Result Value Ref Range   Glucose-Capillary 101 (H) 70 - 99 mg/dL    Comment: Glucose reference range applies only to samples taken after fasting for at least 8 hours.  Reticulocytes     Status: Abnormal   Collection Time: 08/11/20  3:39 AM  Result Value Ref Range   Retic Ct Pct 0.4 0.4 - 3.1 %   RBC. 3.38 (L) 4.22 - 5.81 MIL/uL   Retic Count, Absolute 13.9 (L) 19.0 - 186.0 K/uL   Immature Retic Fract 7.0 2.3 - 15.9 %    Comment: Performed at [facility] [address] [place], [place] [address]  TSH     Status: None   Collection Time: 08/11/20  3:39 AM  Result Value Ref Range   TSH 0.973 0.350 - 4.500 uIU/mL    Comment: Performed by a 3rd Generation assay with a functional sensitivity of <=0.01 uIU/mL. Performed at [facility], [place] [address], [place], [place] [address]   Magnesium     Status: None   Collection Time: 08/11/20  3:40 AM  Result Value Ref Range   Magnesium 2.1 1.7 - 2.4 mg/dL    Comment: Performed at [facility] [address], [place], [place] [address]  Phosphorus     Status: None   Collection Time: 08/11/20  3:40 AM  Result Value Ref Range   Phosphorus 3.1 2.5 - 4.6 mg/dL    Comment: Performed at [facility] [address] [place],  [address]  CBC WITH DIFFERENTIAL     Status: Abnormal   Collection  Time: 08/11/20  3:40 AM  Result Value Ref Range   WBC 17.7 (H) 4.0 - 10.5 K/uL   RBC 3.47 (L) 4.22 - 5.81 MIL/uL    Hemoglobin 9.0 (L) 13.0 - 17.0 g/dL   HCT 28.4 (L) 39.0 - 52.0 %   MCV 81.8 80.0 - 100.0 fL   MCH 25.9 (L) 26.0 - 34.0 pg   MCHC 31.7 30.0 - 36.0 g/dL   RDW 15.6 (H) 11.5 - 15.5 %   Platelets 370 150 - 400 K/uL   nRBC 0.0 0.0 - 0.2 %   Neutrophils Relative % 84 %   Neutro Abs 14.8 (H) 1.7 - 7.7 K/uL   Lymphocytes Relative 9 %   Lymphs Abs 1.6 0.7 - 4.0 K/uL   Monocytes Relative 6 %   Monocytes Absolute 1.1 (H) 0.1 - 1.0 K/uL   Eosinophils Relative 0 %   Eosinophils Absolute 0.0 0.0 - 0.5 K/uL   Basophils Relative 0 %   Basophils Absolute 0.0 0.0 - 0.1 K/uL   Immature Granulocytes 1 %   Abs Immature Granulocytes 0.22 (H) 0.00 - 0.07 K/uL    Comment: Performed at [facility], [address] [place], [place] [address]  Comprehensive metabolic panel     Status: Abnormal   Collection Time: 08/11/20  3:40 AM  Result Value Ref Range   Sodium 134 (L) 135 - 145 mmol/L   Potassium 2.9 (L) 3.5 - 5.1 mmol/L   Chloride 102 98 - 111 mmol/L   CO2 21 (L) 22 - 32 mmol/L   Glucose, Bld 92 70 - 99 mg/dL    Comment: Glucose reference range applies only to samples taken after fasting for at least 8 hours.   BUN 12 6 - 20 mg/dL   Creatinine, Ser 0.32 (L) 0.61 - 1.24 mg/dL   Calcium 8.0 (L) 8.9 - 10.3 mg/dL   Total Protein 5.9 (L) 6.5 - 8.1 g/dL   Albumin 1.6 (L) 3.5 - 5.0 g/dL   AST 28 15 - 41 U/L   ALT 24 0 - 44 U/L   Alkaline Phosphatase 116 38 - 126 U/L   Total Bilirubin 0.5 0.3 - 1.2 mg/dL   GFR, Estimated >60 >60 mL/min    Comment: (NOTE) Calculated using the CKD-EPI Creatinine Equation (2021)    Anion gap 11 5 - 15    Comment: Performed at [facility], [place] [address] [place], [place] [address]  Vitamin B12     Status: Abnormal   Collection Time: 08/11/20  3:41 AM  Result Value Ref Range   Vitamin B-12 1,081 (H) 180 - 914 pg/mL    Comment: (NOTE) This assay is not validated for testing neonatal or myeloproliferative syndrome specimens for Vitamin B12  levels. Performed at [facility], [place] [address], [place], [place] [address]   Iron and TIBC     Status: Abnormal   Collection Time: 08/11/20  3:41 AM  Result Value Ref Range   Iron 8 (L) 45 - 182 ug/dL   TIBC 167 (L) 250 - 450 ug/dL   Saturation Ratios 5 (L) 17.9 - 39.5 %   UIBC 159 ug/dL    Comment: Performed at [facility], [place] [address], [place], [place] [address]  Ferritin     Status: Abnormal   Collection Time: 08/11/20  3:41 AM  Result Value Ref Range   Ferritin 1,102 (H) 24 - 336 ng/mL    Comment: Performed at [facility], [address], [place], [place] [address]  Prealbumin     Status: Abnormal   Collection Time: 08/11/20  3:41 AM  Result Value Ref Range   Prealbumin 7.5 (L) 18 - 38 mg/dL    Comment: Performed at [facility] [address], [place], [place] [address]  Osmolality     Status: None   Collection Time: 08/11/20  3:41 AM  Result Value Ref Range   Osmolality 277 275 - 295 mOsm/kg    Comment: Performed at [facility], [place] [address], [place], [place] [address]  Folate     Status: None   Collection Time: 08/11/20  3:43 AM  Result Value Ref Range   Folate 18.3 >5.9 ng/mL    Comment: Performed at [facility] [address], [place], [place] [address]  CBG monitoring, ED     Status: None   Collection Time: 08/11/20  4:20 AM  Result Value Ref Range   Glucose-Capillary 86 70 - 99 mg/dL    Comment: Glucose reference range applies only to samples taken after fasting for at least 8 hours.  CBG monitoring, ED     Status: None   Collection Time: 08/11/20  8:19 AM  Result Value Ref Range   Glucose-Capillary 83 70 - 99 mg/dL    Comment: Glucose reference range applies only to samples taken after fasting for at least 8 hours.   Comment 1 Notify RN    Comment 2 Document in Chart   CBG monitoring, ED     Status: None   Collection Time: 08/11/20 11:56 AM  Result Value Ref Range   Glucose-Capillary 96 70 - 99 mg/dL     Comment: Glucose reference range applies only to samples taken after fasting for at least 8 hours.  Glucose, capillary     Status: None   Collection Time: 08/11/20  3:45 PM  Result Value Ref Range   Glucose-Capillary 81 70 - 99 mg/dL    Comment: Glucose reference range applies only to samples taken after fasting for at least 8 hours.    MICRO: Blood cx 8/3 MSSA IMAGING: DG Chest Port 1 View  Result Date: 08/10/2020 CLINICAL DATA:  Questionable sepsis. EXAM: PORTABLE CHEST 1 VIEW.  Patient is rotated. COMPARISON:  Chest x-ray 07/16/2020, chest x-ray 10/28/2012 FINDINGS: The heart size and mediastinal contours are unchanged. Persistent elevation of left hemidiaphragm. Low lung volumes. Left base linear atelectasis versus scarring. No focal consolidation. No pulmonary edema. No pleural effusion. No pneumothorax. No acute osseous abnormality. IMPRESSION: No active disease. Electronically Signed   By: Iven Finn M.D.   On: 08/10/2020 17:06     Assessment/Plan:  45yo M with severe MS, admitted for sepsis for possible UTI, however found to have MSSA bacteremia. Could be spontaneous vs uti vs. Sacral wound.  - recommend to narrow abtx from vanco/cefepime to cefazolin. At minimum, would stop vancomycin - not sure if can do mri of hip due to severe contracture. Would continue with wound care. Had recent  ct in July. - repeat blood cx tomorrow to see if bacteremia has cleared. - recommend TTE. - likely will need 4 wk of iv treatment for complicated bacteremia.

## 2020-08-11 NOTE — ED Notes (Signed)
Md informed that pt foley was changed out yesterday.  Spoke to spouse and confirmed that pt. At baseline is non-verbal on his best day states that he may blink once for yes.  Also confirmed with spouse that when pt. Moans a lot that that means he is in pain.  She states that she usually gives him 7.5 mg norco when in pain. Baseline pt is only on o2 2L and places it when saturations are 93% or below.

## 2020-08-11 NOTE — Progress Notes (Signed)
OT Cancellation Note  Patient Details Name: Terry Palmer MRN: [id number] DOB: 04/06/1975   Cancelled Treatment:    Reason Eval/Treat Not Completed: OT screened, no needs identified, will sign off. Patient is bed bound with pressure wounds and contractures. He is total assist for all care and nonverbal. He is not appropriate for acute therapy and has no appreciable rehab potential.  Would recommend long term care at local SNF if family unable to take care of patient.    Yoko Mcgahee L Talise Sligh 08/11/2020, 2:45 PM

## 2020-08-11 NOTE — Consult Note (Signed)
[facility] Nurse Consult Note: Reason for Consult:patient admitted with fever and confusion with dark urine.  Wound type:chronic nonhealing stage 4 sacral wound (anatomy altered due to contractions)  Right ischial unstageable pressure injury to upper gluteal area. Meatus is split from previous injury Pressure Injury POA: Yes Measurement: Right ischium:  2 open wounds 1 cm x 2 cm x 0.3 cm with scattered slough to wound bed Sacrum:  5 cm x 10 cm x 4 cm pale pink nongranulating wound bed Wound bed:see above Drainage (amount, consistency, odor) moderate serosanguinous  Musty odor.  Periwound: scarred from previous wounds.  Dressing procedure/placement/frequency: Cleanse wounds to sacrum and right ischium with NS and pat dry.  Apply Aquacel AG ([facility] # [id number]) to wound bed. Fill in sacral wound with fluffed gauze. Cover with silicone foam.  Change every other day and PRN soilage.   Will not follow at this time.  Please re-consult if needed.  Domenic Moras MSN, RN, FNP-BC CWON Wound, Ostomy, Continence Nurse Pager [phone]

## 2020-08-11 NOTE — Progress Notes (Addendum)
PROGRESS NOTE    Terry Palmer  [id number] DOB: [birth year] DOA: 08/10/2020 PCP: Reymundo Poll, MD  Brief Narrative: 45 year old male with advanced MS, nonverbal, multiple contractures, quadriplegic, with suprapubic catheter, dysphagia with PEG tube, total care was brought to the ED by his family due to fevers, foul-smelling urine, increasing lethargy and somnolence. In the emergency room he was febrile tachycardic, tachypneic with lactic acidosis, and abnormal urinalysis  Assessment & Plan:   Severe sepsis, poa Toxic encephalopathy Catheter associated infection -Discontinue vancomycin, continue cefepime -Follow-up urine cultures and blood cultures -Continue IV fluids -Will need to exchange suprapubic catheter  Sacral decubitus wounds, poa -Has 1 ischial and 1 sacral ulcer, does not appear acutely infected -Continue wound care, [facility] consult  Advanced multiple sclerosis Quadriplegia, dysphagia Neurogenic bladder Nonverbal -Continue home regimen with muscle relaxers  Hyponatremia -Monitor with hydration  Hypokalemia -Replace  Type 2 diabetes mellitus -Resume tube feeds, continue low-dose Lantus  History of seizures -Continue Keppra  DVT prophylaxis: Lovenox Code Status: Limited code, per admitting MD Dr. Roel Cluck family wanted defibrillation pressors and BiPAP but no CPR or intubation Family Communication: No family at bedside, will attempt to update spouse today Disposition Plan:  Status is: Inpatient  Remains inpatient appropriate because:Inpatient level of care appropriate due to severity of illness  Dispo: The patient is from: Home              Anticipated d/c is to: Home              Patient currently is not medically stable to d/c.   Difficult to place patient No        Consultants:    Procedures:   Antimicrobials:    Subjective: -Febrile this morning, remains obtunded  Objective: Vitals:   08/11/20 0830 08/11/20 1030 08/11/20 1100  08/11/20 1130  BP: 121/78 134/76 128/82 127/80  Pulse: (!) 115  (!) 115   Resp: '20 18 18 20  '$ Temp:      TempSrc:      SpO2: 100%  100%   Weight:        Intake/Output Summary (Last 24 hours) at 08/11/2020 1141 Last data filed at 08/11/2020 0605 Gross per 24 hour  Intake 3050 ml  Output 1200 ml  Net 1850 ml   Filed Weights   08/10/20 1600  Weight: 83.9 kg    Examination:  Chronically ill cachectic, contracted obtunded male laying in bed, nonverbal HEENT: No JVD CVS: S1-S2, tachycardic Lungs: Decreased breath sounds to bases Abdomen: Soft, nontender, PEG tube noted, suprapubic catheter noted Extremities, both lower extremities contracted Skin: Right ischial ulcer and large sacral decubitus wound    Data Reviewed:   CBC: Recent Labs  Lab 08/10/20 1742 08/11/20 0340  WBC 17.3* 17.7*  NEUTROABS 15.7* 14.8*  HGB 10.7* 9.0*  HCT 33.6* 28.4*  MCV 82.0 81.8  PLT 418* 0000000   Basic Metabolic Panel: Recent Labs  Lab 08/10/20 1952 08/11/20 0340  NA 131* 134*  K 2.9* 2.9*  CL 97* 102  CO2 22 21*  GLUCOSE 144* 92  BUN 17 12  CREATININE 0.37* 0.32*  CALCIUM 8.0* 8.0*  MG 1.8 2.1  PHOS 3.2 3.1   GFR: Estimated Creatinine Clearance: 136.9 mL/min (A) (by C-G formula based on SCr of 0.32 mg/dL (L)). Liver Function Tests: Recent Labs  Lab 08/10/20 1952 08/11/20 0340  AST 28 28  ALT 24 24  ALKPHOS 106 116  BILITOT 0.5 0.5  PROT 6.0* 5.9*  ALBUMIN 1.7* 1.6*  No results for input(s): LIPASE, AMYLASE in the last 168 hours. No results for input(s): AMMONIA in the last 168 hours. Coagulation Profile: Recent Labs  Lab 08/10/20 2018  INR 1.2   Cardiac Enzymes: No results for input(s): CKTOTAL, CKMB, CKMBINDEX, TROPONINI in the last 168 hours. BNP (last 3 results) No results for input(s): PROBNP in the last 8760 hours. HbA1C: Recent Labs    08/10/20 1742  HGBA1C 7.1*   CBG: Recent Labs  Lab 08/11/20 0233 08/11/20 0420 08/11/20 0819  GLUCAP 101* 86 83    Lipid Profile: No results for input(s): CHOL, HDL, LDLCALC, TRIG, CHOLHDL, LDLDIRECT in the last 72 hours. Thyroid Function Tests: Recent Labs    08/11/20 0339  TSH 0.973   Anemia Panel: Recent Labs    08/11/20 0339 08/11/20 0341 08/11/20 0343  VITAMINB12  --  1,081*  --   FOLATE  --   --  18.3  FERRITIN  --  1,102*  --   TIBC  --  167*  --   IRON  --  8*  --   RETICCTPCT 0.4  --   --    Urine analysis:    Component Value Date/Time   COLORURINE AMBER (A) 08/10/2020 1635   APPEARANCEUR CLOUDY (A) 08/10/2020 1635   LABSPEC 1.024 08/10/2020 1635   PHURINE 7.0 08/10/2020 1635   GLUCOSEU NEGATIVE 08/10/2020 1635   HGBUR SMALL (A) 08/10/2020 1635   BILIRUBINUR NEGATIVE 08/10/2020 1635   KETONESUR NEGATIVE 08/10/2020 1635   PROTEINUR 30 (A) 08/10/2020 1635   UROBILINOGEN 0.2 10/20/2014 1328   NITRITE NEGATIVE 08/10/2020 1635   LEUKOCYTESUR LARGE (A) 08/10/2020 1635   Sepsis Labs: '@LABRCNTIP'$ (procalcitonin:4,lacticidven:4)  ) Recent Results (from the past 240 hour(s))  Blood Culture (routine x 2)     Status: None (Preliminary result)   Collection Time: 08/10/20  3:52 PM   Specimen: BLOOD RIGHT ARM  Result Value Ref Range Status   Specimen Description BLOOD RIGHT ARM  Final   Special Requests   Final    BOTTLES DRAWN AEROBIC AND ANAEROBIC Blood Culture adequate volume   Culture   Final    NO GROWTH < 24 HOURS Performed at [facility], [place] [address] [place], [place] [address]    Report Status PENDING  Incomplete  Resp Panel by RT-PCR (Flu A&B, Covid) Nasopharyngeal Swab     Status: None   Collection Time: 08/10/20  3:53 PM   Specimen: Nasopharyngeal Swab; Nasopharyngeal(NP) swabs in vial transport medium  Result Value Ref Range Status   SARS Coronavirus 2 by RT PCR NEGATIVE NEGATIVE Final    Comment: (NOTE) SARS-CoV-2 target nucleic acids are NOT DETECTED.  The SARS-CoV-2 RNA is generally detectable in upper respiratory specimens during the acute phase  of infection. The lowest concentration of SARS-CoV-2 viral copies this assay can detect is 138 copies/mL. A negative result does not preclude SARS-Cov-2 infection and should not be used as the sole basis for treatment or other patient management decisions. A negative result may occur with  improper specimen collection/handling, submission of specimen other than nasopharyngeal swab, presence of viral mutation(s) within the areas targeted by this assay, and inadequate number of viral copies(<138 copies/mL). A negative result must be combined with clinical observations, patient history, and epidemiological information. The expected result is Negative.  Fact Sheet for Patients:  EntrepreneurPulse.com.au  Fact Sheet for Healthcare Providers:  IncredibleEmployment.be  This test is no t yet approved or cleared by the [place] FDA and  has been  authorized for detection and/or diagnosis of SARS-CoV-2 by FDA under an Emergency Use Authorization (EUA). This EUA will remain  in effect (meaning this test can be used) for the duration of the COVID-19 declaration under Section 564(b)(1) of the Act, 21 U.S.C.section 360bbb-3(b)(1), unless the authorization is terminated  or revoked sooner.       Influenza A by PCR NEGATIVE NEGATIVE Final   Influenza B by PCR NEGATIVE NEGATIVE Final    Comment: (NOTE) The Xpert Xpress SARS-CoV-2/FLU/RSV plus assay is intended as an aid in the diagnosis of influenza from Nasopharyngeal swab specimens and should not be used as a sole basis for treatment. Nasal washings and aspirates are unacceptable for Xpert Xpress SARS-CoV-2/FLU/RSV testing.  Fact Sheet for Patients: EntrepreneurPulse.com.au  Fact Sheet for Healthcare Providers: IncredibleEmployment.be  This test is not yet approved or cleared by the [place] FDA and has been authorized for detection and/or diagnosis of  SARS-CoV-2 by FDA under an Emergency Use Authorization (EUA). This EUA will remain in effect (meaning this test can be used) for the duration of the COVID-19 declaration under Section 564(b)(1) of the Act, 21 U.S.C. section 360bbb-3(b)(1), unless the authorization is terminated or revoked.  Performed at [facility], [place] [address] [place], [place] [address]   Blood Culture (routine x 2)     Status: None (Preliminary result)   Collection Time: 08/10/20  3:57 PM   Specimen: BLOOD LEFT FOREARM  Result Value Ref Range Status   Specimen Description BLOOD LEFT FOREARM  Final   Special Requests   Final    BOTTLES DRAWN AEROBIC AND ANAEROBIC Blood Culture adequate volume   Culture   Final    NO GROWTH < 24 HOURS Performed at [facility], [place] [address], [place], [place] [address]    Report Status PENDING  Incomplete         Radiology Studies: DG Chest Port 1 View  Result Date: 08/10/2020 CLINICAL DATA:  Questionable sepsis. EXAM: PORTABLE CHEST 1 VIEW.  Patient is rotated. COMPARISON:  Chest x-ray 07/16/2020, chest x-ray 10/28/2012 FINDINGS: The heart size and mediastinal contours are unchanged. Persistent elevation of left hemidiaphragm. Low lung volumes. Left base linear atelectasis versus scarring. No focal consolidation. No pulmonary edema. No pleural effusion. No pneumothorax. No acute osseous abnormality. IMPRESSION: No active disease. Electronically Signed   By: Iven Finn M.D.   On: 08/10/2020 17:06        Scheduled Meds:  baclofen  20 mg Per Tube QID   buPROPion  75 mg Per Tube TID   dantrolene  50 mg Per J Tube QID   enoxaparin (LOVENOX) injection  40 mg Subcutaneous Q24H   famotidine  20 mg Per Tube Daily   feeding supplement (PROSource TF)  45 mL Per Tube TID   free water  240 mL Per Tube Q4H   insulin aspart  0-9 Units Subcutaneous Q4H   insulin detemir  10 Units Subcutaneous Daily   ipratropium-albuterol  3 mL Nebulization QID   levETIRAcetam   1,200 mg Per Tube TID   omeprazole  40 mg Per Tube Daily   polyvinyl alcohol  1 drop Both Eyes BID   potassium chloride  40 mEq Oral Q2H   tiZANidine  2 mg Per Tube TID   Continuous Infusions:  sodium chloride 100 mL/hr at 08/11/20 1123   ceFEPime (MAXIPIME) IV 2 g (08/11/20 1128)   feeding supplement (GLUCERNA 1.5 CAL)     vancomycin Stopped (08/11/20 0813)  LOS: 1 day    Time spent: 79mn  PDomenic Polite MD [facility]   08/11/2020, 11:41 AM

## 2020-08-11 NOTE — Progress Notes (Signed)
Inpatient Diabetes Program Recommendations  AACE/ADA: New Consensus Statement on Inpatient Glycemic Control (2015)  Target Ranges:  Prepandial:   less than 140 mg/dL      Peak postprandial:   less than 180 mg/dL (1-2 hours)      Critically ill patients:  140 - 180 mg/dL   Lab Results  Component Value Date   GLUCAP 83 08/11/2020   HGBA1C 7.1 (H) 08/10/2020    Review of Glycemic Control  Diabetes history: DM2 (diagnosed previous admission May 22) Outpatient Diabetes medications: Levemir 22 units qd Current orders for Inpatient glycemic control: Levemir 10 units qd, Novolog 0-9 units q 4 hrs.  Inpatient Diabetes Program Recommendations:   A1c decreased from 8.6 05/20/20 to current 7.0. Agree with inpatient orders.  Thank you, Nani Gasser. Raymone Pembroke, RN, MSN, CDE  Diabetes Coordinator Inpatient Glycemic Control Team Team Pager [phone] (8am-5pm) 08/11/2020 10:44 AM

## 2020-08-11 NOTE — Consult Note (Signed)
Consultation Note Date: 08/12/2020   Patient Name: Terry Palmer  DOB: 13-Jul-1975  MRN: [id number]  Age / Sex: 45 y.o., male  PCP: Reymundo Poll, MD Referring Physician: Domenic Polite, MD  Reason for Consultation: Establishing goals of care  HPI/Patient Profile: 45 y.o. 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 He presented to the emergency department on 08/10/2020 with fever and foul-smelling urine. In the ED, he appeared to be septic with fever and tachycardia. UA appears to be the source. Started on broad-spectrum IV antibiotics. He was admitted to TRN with sepsis secondary to UTI.  Clinical Assessment and Goals of Care: I have reviewed medical records including EPIC notes, labs and imaging, discussed with bedside RN, and examined the patient. His eyes are open but he does not track.    I spoke with patient's wife [name] by phone to discuss diagnosis, prognosis, GOC, EOL wishes, disposition, and options.  Patient has been seen by Palliative Medicine several times over the years dating as far back as 2013 and most recently seen in May 2022. I re-introduced Palliative Medicine as specialized medical care for people living with serious illness. It focuses on providing relief from the symptoms and stress of a serious illness.   We discussed a brief life review of the patient. Alois and [name] have been married for more than 21 years. They met in [place]; Maddax is originally from [place] is from [place]. They have 2 adult children (ages 73 and 33) that live in the home with them. He was diagnosed with multiple sclerosis at age 58. 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   As far as functional status, Carlyn has been bed-bound since 2012. He has home health nurses 7 days a week, 12 hours per day.  We discussed his current illness and what it means in the larger context of his ongoing co-morbidities.  Natural disease trajectory of advanced MS was discussed. We reviewed that MS is a progressive, non-curable disease underlying the patient's current acute medical conditions. We reviewed specific indicators that patient's disease is very advanced.   [name] verbalizes understanding of MS trajectory. She share that when Sorin declined very rapidly back in 2012 and family was told his prognosis was only a few weeks. His mother then requested he have a feeding tube, as she feared he would "starve to death". We discussed that artificial feeding is a life-prolonging intervention and that if this was not started he would have died from the underlying condition of [place] expresses feeling "frustrated" and "hopeless" lately. She feels that Benjamen has suffered from more complications including the pressure wound and recurrent UTIs. Discussed that these complications would continue to occur, even with excellent care. I spent time acknowledging the excellent care [facility] and the home health  staff has provided for Shadrick over the past 10 years.   [name] shares that she recently looked into palliative services through [facility]. I provided education and counseling on the difference between Outpatient Palliative and Hospice care. Palliative care and hospice have similar goals of managing symptoms, promoting comfort, improving quality of life. However, palliative care may be offered during any phase of a serious illness, while hospice care is usually offered when a person is expected to live for 6 months or less.   Provided education that that hospice offers a holistic approach to care in the setting of end-stage illness/disease, and is about supporting the  patient where they are while allowing a natural course to occur. Discussed the hospice team includes RNs, physicians, social workers, and chaplains. They can provide personal care, support for the family, and help keep patient out of the hospital. I emphasized that hospice offered much more support compared to outpatient palliative.   We did discuss code status as patient is currently listed as a partial code. Encouraged [name] to consider full DNR/DNI status understanding evidenced based poor outcomes in similar hospitalized patients, as the cause of the arrest is likely associated with chronic/terminal disease rather than a reversible acute cardio-pulmonary event.  I explained that DNR/DNI does not change the medical plan and it only comes into effect after a person has arrested (died).  It is a protective measure to keep [place] from harming the patient in their last moments of life. [name] is agreeable to DNR/DNI with understanding that in the event of cardiopulmonary arrest, patient would not receive CPR, defibrillation, ACLS medications, or intubation.    Questions and concerns were addressed. [name] is agreeable to learn more about hospice services. I have reached out to Oliver, liaison for [facility] and she is willing to call [name] to discuss further.   Dr. Broadus John and [facility] updated on plan of care.     Primary decision maker: Wife, Yaser Harvill    SUMMARY OF RECOMMENDATIONS   Code status changed from limited code to DNR/DNI Continue current supportive care Wife is interested in learning more about hospice services - have reached out to liaison for [facility] to discuss further PMT will continue to follow  Code Status/Advance Care Planning: DNR  Palliative Prophylaxis:  Oral Care and Turn Reposition  Additional Recommendations (Limitations, Scope, Preferences): Full Scope Treatment  Psycho-social/Spiritual:  Created space and opportunity for family to express thoughts  and feelings regarding patient's current medical situation.  Emotional support provided   Prognosis:  < 6 months  Discharge Planning: To Be Determined      Primary Diagnoses: Present on Admission:  Femur fracture ([employer])  UTI (urinary tract infection)  Sepsis ([place])  Sacral decubitus ulcer, stage II ([place])  Quadriplegia and quadriparesis ([place])  Multiple sclerosis ([place])  Acute metabolic encephalopathy  Hyponatremia  Hypokalemia   I have reviewed the medical record, interviewed the patient and family, and examined the patient. The following aspects are pertinent.  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   MS (multiple sclerosis) (H[facility])    Neurogenic bladder    Neuromuscular disorder (H[facility])    Quadraperesis   Normocytic anemia 05/28/2011   Quadriparesis (muscle weakness) 03/12/2011   Quadriplegia and quadriparesis ([place])  12/24/2014   Recurrent upper respiratory infection (URI)    Recurrent UTI    Seizure disorder (HCC)    Shortness of breath    Social History   Socioeconomic History   Marital status: Married    Spouse name: Not on file   Number of children: 2   Years of education: Not on file   Highest education level: Not on file  Occupational History   Not on file  Tobacco Use   Smoking status: Former    Types: Cigarettes    Quit date: 02/02/1999    Years since quitting: 21.5   Smokeless tobacco: Former  Substance and Sexual Activity   Alcohol use: No   Drug use: Not on file    Comment: Former use   Sexual activity: Never  Other Topics Concern   Not on file  Social History Narrative   The patient currently lives at home with his wife and kids, who help him with his ADL's.  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  The patient currently has [facility], and is in the process of reapplying for Medicaid (12/2010).    Kaylon Hitz (cell) [phone]; (home) [phone],     Yader Criger (cell) [phone] (home) [phone]   Social Determinants of Health   Financial Resource Strain: Not on file  Food Insecurity: Not on file  Transportation Needs: Not on file  Physical Activity: Not on file  Stress: Not on file  Social Connections: Not on file   Family History  Problem Relation Age of Onset   Asthma Mother    Scheduled Meds:  baclofen  20 mg Per Tube QID   buPROPion  75 mg Per Tube TID   chlorhexidine  15 mL Mouth Rinse BID   Chlorhexidine Gluconate Cloth  6 each Topical Daily   dantrolene  50 mg Per J Tube QID   enoxaparin (LOVENOX) injection  40 mg Subcutaneous Q24H   famotidine  20 mg Per Tube Daily   feeding supplement (PROSource TF)  45 mL Per Tube TID   free water  150 mL Per Tube Q4H   insulin aspart  0-9 Units Subcutaneous Q4H   insulin detemir  10 Units Subcutaneous Daily   levETIRAcetam  1,200 mg Per Tube TID   mouth rinse  15 mL Mouth Rinse q12n4p   multivitamin with minerals  1 tablet Per Tube Daily   mupirocin ointment  1 application Nasal BID   nutrition supplement (JUVEN)  1 packet Per Tube BID BM   polyvinyl alcohol  1 drop Both Eyes BID   tiZANidine  2 mg Per Tube TID   Continuous Infusions:  sodium chloride 75 mL/hr at 08/12/20 1447   ampicillin-sulbactam (UNASYN) IV     feeding supplement (GLUCERNA 1.5 CAL) 1,000 mL (08/12/20 1521)   PRN Meds:.acetaminophen **OR** acetaminophen, HYDROcodone-acetaminophen, ibuprofen, ipratropium-albuterol   No Known Allergies Review of Systems  Unable to perform ROS: Patient nonverbal   Physical Exam Constitutional:      Appearance: He is ill-appearing.  Cardiovascular:     Rate and Rhythm: Tachycardia present.  Neurological:     Comments: Quadriplegia, contracted  Psychiatric:         Speech: He is noncommunicative.    Vital Signs: BP (!) 144/94 (BP Location: Right Wrist)   Pulse (!) 106   Temp 98.4 F (36.9 C) (Axillary)   Resp 14   Wt 83.9 kg  SpO2 100%   BMI 22.51 kg/m  Pain Scale: Faces     SpO2: SpO2: 100 % O2 Device:SpO2: 100 % O2 Flow Rate: .O2 Flow Rate (L/min): 2 L/min  IO: Intake/output summary:  Intake/Output Summary (Last 24 hours) at 08/12/2020 1555 Last data filed at 08/12/2020 1500 Gross per 24 hour  Intake 887.78 ml  Output 2743 ml  Net -1855.22 ml     Palliative Assessment/Data: PPS 30%      Time In: 1200 Time Out: 1255 Time Total: 55 minutes Greater than 50%  of this time was spent counseling and coordinating care related to the above assessment and plan.  Signed by: Lavena Bullion, NP   Please contact Palliative Medicine Team phone at [phone] for questions and concerns.  For individual provider: See Shea Evans

## 2020-08-11 NOTE — Progress Notes (Signed)
PT Cancellation Note  Patient Details Name: Terry Palmer MRN: [id number] DOB: [birth year]   Cancelled Treatment:    Reason Eval/Treat Not Completed: PT screened, no needs identified, will sign off.  Pt is chronically bed bound with pressure wounds and contractures.  He is not appropriate for acute therapy.  He is total assist for all care and would be a total lift for OOB mobility (not that I recommend given the severity and location of his wounds.  If family is struggling with his care he would be appropriate for residential SNF.  Please reach out with questions.   Thanks,  Verdene Lennert, PT, DPT  Acute Rehabilitation Ortho Tech Supervisor [phone] pager #[phone] office      Terry Palmer 08/11/2020, 10:32 AM

## 2020-08-12 DIAGNOSIS — Z7189 Other specified counseling: Secondary | ICD-10-CM

## 2020-08-12 DIAGNOSIS — Z515 Encounter for palliative care: Secondary | ICD-10-CM

## 2020-08-12 LAB — CBC
HCT: 28.9 % — ABNORMAL LOW (ref 39.0–52.0)
Hemoglobin: 9.2 g/dL — ABNORMAL LOW (ref 13.0–17.0)
MCH: 26.1 pg (ref 26.0–34.0)
MCHC: 31.8 g/dL (ref 30.0–36.0)
MCV: 81.9 fL (ref 80.0–100.0)
Platelets: 378 10*3/uL (ref 150–400)
RBC: 3.53 MIL/uL — ABNORMAL LOW (ref 4.22–5.81)
RDW: 15.8 % — ABNORMAL HIGH (ref 11.5–15.5)
WBC: 14.5 10*3/uL — ABNORMAL HIGH (ref 4.0–10.5)
nRBC: 0 % (ref 0.0–0.2)

## 2020-08-12 LAB — BASIC METABOLIC PANEL
Anion gap: 9 (ref 5–15)
BUN: 10 mg/dL (ref 6–20)
CO2: 23 mmol/L (ref 22–32)
Calcium: 8.3 mg/dL — ABNORMAL LOW (ref 8.9–10.3)
Chloride: 103 mmol/L (ref 98–111)
Creatinine, Ser: <0.3 mg/dL — ABNORMAL LOW (ref 0.61–1.24)
Glucose, Bld: 149 mg/dL — ABNORMAL HIGH (ref 70–99)
Potassium: 3.6 mmol/L (ref 3.5–5.1)
Sodium: 135 mmol/L (ref 135–145)

## 2020-08-12 LAB — URINE CULTURE: Culture: 70000 — AB

## 2020-08-12 LAB — GLUCOSE, CAPILLARY
Glucose-Capillary: 151 mg/dL — ABNORMAL HIGH (ref 70–99)
Glucose-Capillary: 150 mg/dL — ABNORMAL HIGH (ref 70–99)
Glucose-Capillary: 218 mg/dL — ABNORMAL HIGH (ref 70–99)
Glucose-Capillary: 188 mg/dL — ABNORMAL HIGH (ref 70–99)
Glucose-Capillary: 89 mg/dL (ref 70–99)
Glucose-Capillary: 164 mg/dL — ABNORMAL HIGH (ref 70–99)

## 2020-08-12 MED ORDER — JUVEN PO PACK
1.0000 | PACK | Freq: Two times a day (BID) | ORAL | Status: DC
  Administered 2020-08-12 – 2020-08-16 (×8): 1
  Filled 2020-08-12 (×8): qty 1

## 2020-08-12 MED ORDER — SODIUM CHLORIDE 0.9 % IV SOLN
3.0000 g | Freq: Four times a day (QID) | INTRAVENOUS | Status: DC
  Administered 2020-08-12 – 2020-08-16 (×17): 3 g via INTRAVENOUS
  Filled 2020-08-12: qty 8
  Filled 2020-08-12 (×5): qty 3
  Filled 2020-08-12 (×2): qty 8
  Filled 2020-08-12: qty 3
  Filled 2020-08-12: qty 8
  Filled 2020-08-12: qty 3
  Filled 2020-08-12 (×2): qty 8
  Filled 2020-08-12: qty 3
  Filled 2020-08-12 (×4): qty 8
  Filled 2020-08-12: qty 3
  Filled 2020-08-12: qty 8

## 2020-08-12 MED ORDER — ADULT MULTIVITAMIN W/MINERALS CH
1.0000 | ORAL_TABLET | Freq: Every day | ORAL | Status: DC
  Administered 2020-08-12 – 2020-08-16 (×5): 1
  Filled 2020-08-12 (×4): qty 1

## 2020-08-12 MED ORDER — FREE WATER
150.0000 mL | Status: DC
  Administered 2020-08-12 – 2020-08-16 (×27): 150 mL

## 2020-08-12 MED ORDER — GLUCERNA 1.5 CAL PO LIQD
1000.0000 mL | ORAL | Status: DC
  Administered 2020-08-12 – 2020-08-15 (×4): 1000 mL
  Filled 2020-08-12 (×7): qty 1000

## 2020-08-12 NOTE — Progress Notes (Signed)
Initial Nutrition Assessment  DOCUMENTATION CODES:   Not applicable  INTERVENTION:   Tube feeding via PEG: - Glucerna 1.5 @ 50 ml/hr (1200 ml/day) - ProSource TF 45 ml TID - Free water flushes of 150  ml q 4 hours  Tube feeding regimen provides 1920 kcal, 132 grams of protein, and 911 ml of H2O.   Total free water with flushes: 1811 ml  - MVI with minerals daily per tube  - 1 packet Juven BID, each packet provides 95 calories, 2.5 grams of protein, and 9.8 grams of carbohydrate; also contains 7 grams of L-arginine and L-glutamine, 300 mg vitamin C, 15 mg vitamin E, 1.2 mcg vitamin B-12, 9.5 mg zinc, 200 mg calcium, and 1.5 g calcium Beta-hydroxy-Beta-methylbutyrate to support wound healing  NUTRITION DIAGNOSIS:   Increased nutrient needs related to wound healing as evidenced by estimated needs.  GOAL:   Patient will meet greater than or equal to 90% of their needs  MONITOR:   Labs, Weight trends, TF tolerance, Skin, I & O's  REASON FOR ASSESSMENT:   Consult Enteral/tube feeding initiation and management, Assessment of nutrition requirement/status  ASSESSMENT:   45 year old male who presented to the ED on 8/03 with sepsis. PMH of MS with quadriplegia, neurogenic bladder s/p suprapubic catheter, chronically bedbound, multiple contractures, chronic pressure injuries, T2DM, HTN, seizures, dysphagia with PEG tube. Pt found to have MSSA bacteremia.  Met with pt in room. No family present at time of RD visit. Pt sleeping with tube feeds infusing at 53 ml/hr.  Spoke with pt's wife via phone call at number listed in chart. Pt's wife provided excellent nutrition history. Pt on Glucerna 1.5 tube feeds at home. He receives a total of 4 237-ml cartons of Glucerna 1.5 formula infusing continuously at 70 ml/hr via PEG. Pt's home nurse starts tube feeds between 8:00-9:00 am and the tube feeds are typically done infusing by 2:00-3:00 am the next day. Tube feeds are held periodically  throughout the day if pt with a high blood sugar reading or if pt has a bowel movement. Pt always receiving 100% of the 4 cartons of tube feeds. He also receives 3 ProSource TF daily. Pt also receives 240 ml of free water flushes at 8am, 12pm, 4pm, 8pm, and 12am.  This full regimen provides 1544 kcal, 111 grams of protein, and 1920 ml of free water. Pt also receives a daily MVI, vitamin C, vitamin D, and omega-3 supplements at home. RD will order daily MVI while pt is admitted.  Pt's wife reports that they were recommended to administer Juven to pt via tube but that this was too expensive. She is agreeable to pt receiving Juven during admission to aid in wound healing.  Pt's wife reports that pt typically has very regular bowel movements, about 2 per day. These are normally liquid in consistency.  Reviewed weight history in chart. Weight up slightly compared to weights from 2018 and 2019 and appears stable compared to weight from May of this year.  Spoke with pt's RN via phone call who reports pt has been having multiple bowel movements today.  Current TF: Glucerna 1.5 @ 53 ml/hr, ProSource TF 45 ml TID, free water flushes of 240 ml q 4 hours  Medications reviewed and include: pepcid, SSI q 4 hours, levemir 10 units daily, IV abx IVF: NS @ 100 ml/hr  Labs reviewed: iron 8, hemoglobin 9.2, hemoglobin A1C 7.1 CBG's: 81-151 x 24 hours  UOP: 3100 ml x 24 hours I/O's: 560 ml since  admit  NUTRITION - FOCUSED PHYSICAL EXAM:  Flowsheet Row Most Recent Value  Orbital Region Mild depletion  Upper Arm Region Mild depletion  Thoracic and Lumbar Region Moderate depletion  Buccal Region Mild depletion  Temple Region Mild depletion  Clavicle Bone Region Moderate depletion  Clavicle and Acromion Bone Region Moderate depletion  Scapular Bone Region Unable to assess  Dorsal Hand Moderate depletion  Patellar Region Severe depletion  Anterior Thigh Region Severe depletion  Posterior Calf Region  Severe depletion  Edema (RD Assessment) None  Hair Reviewed  Eyes Reviewed  Mouth Reviewed  Skin Reviewed  Nails Reviewed      Pt with moderate to severe muscle depletions; suspect this is related to bedbound status.  Diet Order:   Diet Order             Diet NPO time specified  Diet effective now                   EDUCATION NEEDS:   No education needs have been identified at this time  Skin:  Skin Assessment: Skin Integrity Issues: Stage III: upper back Stage IV: sacrum Unstageable: L ischial tuberosity  Last BM:  no documented BM  Height:   Ht Readings from Last 1 Encounters:  05/18/20 6' 4"  (1.93 m)    Weight:   Wt Readings from Last 1 Encounters:  08/10/20 83.9 kg    BMI:  Body mass index is 22.51 kg/m.  Estimated Nutritional Needs:   Kcal:  1850-2050  Protein:  115-130 grams  Fluid:  1.8-2.0 L    Gustavus Bryant, MS, RD, LDN Inpatient Clinical Dietitian Please see AMiON for contact information.

## 2020-08-12 NOTE — Progress Notes (Signed)
   08/12/20 0750 08/12/20 0900  Assess: MEWS Score  Temp (!) 101.6 F (38.7 C) (!) 101.7 F (38.7 C)  BP (!) 149/97  --   Pulse Rate (!) 141  --   ECG Heart Rate (!) 142  --   Resp (!) 23  --   Level of Consciousness Alert Alert  SpO2 100 %  --   O2 Device Nasal Cannula  --   O2 Flow Rate (L/min) 2 L/min  --   Assess: if the MEWS score is Yellow or Red  Were vital signs taken at a resting state? Yes  --   Focused Assessment No change from prior assessment  --   Early Detection of Sepsis Score *See Row Information* High  --   MEWS guidelines implemented *See Row Information* Yes  --   Treat  MEWS Interventions Administered prn meds/treatments;Escalated (See documentation below)  --   Pain Scale Faces  --   Faces Pain Scale 6  --   Take Vital Signs  Increase Vital Sign Frequency  Red: Q 1hr X 4 then Q 4hr X 4, if remains red, continue Q 4hrs  --   Escalate  MEWS: Escalate Red: discuss with charge nurse/RN and provider, consider discussing with RRT  --   Notify: Charge Nurse/RN  Name of Charge Nurse/RN Notified Elmyra Ricks  --   Date Charge Nurse/RN Notified 08/12/20  --   Time Charge Nurse/RN Notified 0800  --   Notify: Provider  Provider Name/Title Dr. Broadus John  --   Date Provider Notified 08/12/20  --   Time Provider Notified 0800  --   Notification Type Face-to-face  --   Notification Reason Change in status  --   Provider response At bedside;No new orders  --   Date of Provider Response 08/12/20  --   Time of Provider Response 0800  --   Document  Patient Outcome Stabilized after interventions  --

## 2020-08-12 NOTE — Progress Notes (Signed)
PROGRESS NOTE    Terry Palmer  [id number] DOB: 12-Feb-1975 DOA: 08/10/2020 PCP: Reymundo Poll, MD  Brief Narrative: 45 year old male with advanced MS, nonverbal, multiple contractures, quadriplegic, with suprapubic catheter, dysphagia with PEG tube, total care was brought to the ED by his family due to fevers, foul-smelling urine, increasing lethargy and somnolence. In the emergency room he was febrile tachycardic, tachypneic with lactic acidosis, and abnormal urinalysis  Assessment & Plan:   Severe sepsis, poa Toxic encephalopathy MSSA bacteremia ? Catheter associated infection -Blood cultures with MSSA, urine culture with 70,000 K. Proteus, susceptibilities pending -Continue IV cefepime today, continue IV fluids -Left voicemail for wife today, need to discuss goals of care and overall poor prognosis and high risk of complications -Has large sacral decubitus ulcer,, CT in May noted erosive changes in the left ischial tuberosity consistent with osteomyelitis  -Appreciate infectious disease input -Reportedly suprapubic catheter was exchanged day of admission, will confirm this with spouse  Sacral decubitus wounds, poa -Has 1 ischial and a large sacral ulcer,  -Continue wound care, [facility] consult appreciated -See discussion above, regarding erosive changes involving left ischial tuberosity noted on CT abdomen pelvis in May 22  Advanced multiple sclerosis Quadriplegia, dysphagia Neurogenic bladder Nonverbal -Continue home regimen with muscle relaxers -Continue tube feeds  Hyponatremia -Monitor with hydration  Hypokalemia -Replaced  Type 2 diabetes mellitus -continue  tube feeds, continue low-dose Lantus  History of seizures -Continue Keppra  DVT prophylaxis: Lovenox Code Status: Limited code, per admitting MD Dr. Roel Cluck family wanted defibrillation pressors and BiPAP but no CPR or intubation Family Communication: No family at bedside, left voicemail for wife  [name] Disposition Plan:  Status is: Inpatient  Remains inpatient appropriate because:Inpatient level of care appropriate due to severity of illness  Dispo: The patient is from: Home              Anticipated d/c is to: Home              Patient currently is not medically stable to d/c.   Difficult to place patient No   Consultants:  ID  Procedures:   Antimicrobials:    Subjective: -Febrile this morning, blood pressure is up, remains obtunded  Objective: Vitals:   08/11/20 2344 08/12/20 0344 08/12/20 0714 08/12/20 0730  BP: 126/86 137/83 (!) 180/99 (!) 160/96  Pulse: (!) 109 (!) 117 (!) 142 (!) 140  Resp: '16 16 19 17  '$ Temp:  99.6 F (37.6 C) (!) 102.8 F (39.3 C) (!) 102.8 F (39.3 C)  TempSrc:  Axillary Axillary Axillary  SpO2: 99% 95% 100% 100%  Weight:        Intake/Output Summary (Last 24 hours) at 08/12/2020 1137 Last data filed at 08/12/2020 0600 Gross per 24 hour  Intake 1007.78 ml  Output 3418 ml  Net -2410.22 ml   Filed Weights   08/10/20 1600  Weight: 83.9 kg    Examination:  General exam: Chronically ill cachectic, contracted obtunded male laying in bed, nonverbal HEENT: No JVD CVS: S1-S2, tachycardic Lungs: Decreased breath sounds to bases Abdomen: Soft, nontender, PEG tube noted, suprapubic catheter noted Extremities, both lower extremities contracted  Skin: Right ischial ulcer and large sacral decubitus wound    Data Reviewed:   CBC: Recent Labs  Lab 08/10/20 1742 08/11/20 0340 08/12/20 0331  WBC 17.3* 17.7* 14.5*  NEUTROABS 15.7* 14.8*  --   HGB 10.7* 9.0* 9.2*  HCT 33.6* 28.4* 28.9*  MCV 82.0 81.8 81.9  PLT 418* 370 378  Basic Metabolic Panel: Recent Labs  Lab 08/10/20 1952 08/11/20 0340 08/12/20 0331  NA 131* 134* 135  K 2.9* 2.9* 3.6  CL 97* 102 103  CO2 22 21* 23  GLUCOSE 144* 92 149*  BUN '17 12 10  '$ CREATININE 0.37* 0.32* <0.30*  CALCIUM 8.0* 8.0* 8.3*  MG 1.8 2.1  --   PHOS 3.2 3.1  --    GFR: CrCl cannot be  calculated (This lab value cannot be used to calculate CrCl because it is not a number: <0.30). Liver Function Tests: Recent Labs  Lab 08/10/20 1952 08/11/20 0340  AST 28 28  ALT 24 24  ALKPHOS 106 116  BILITOT 0.5 0.5  PROT 6.0* 5.9*  ALBUMIN 1.7* 1.6*   No results for input(s): LIPASE, AMYLASE in the last 168 hours. No results for input(s): AMMONIA in the last 168 hours. Coagulation Profile: Recent Labs  Lab 08/10/20 2018  INR 1.2   Cardiac Enzymes: No results for input(s): CKTOTAL, CKMB, CKMBINDEX, TROPONINI in the last 168 hours. BNP (last 3 results) No results for input(s): PROBNP in the last 8760 hours. HbA1C: Recent Labs    08/10/20 1742  HGBA1C 7.1*   CBG: Recent Labs  Lab 08/11/20 1545 08/11/20 1945 08/11/20 2325 08/12/20 0335 08/12/20 0724  GLUCAP 81 129* 126* 151* 150*   Lipid Profile: No results for input(s): CHOL, HDL, LDLCALC, TRIG, CHOLHDL, LDLDIRECT in the last 72 hours. Thyroid Function Tests: Recent Labs    08/11/20 0339  TSH 0.973   Anemia Panel: Recent Labs    08/11/20 0339 08/11/20 0341 08/11/20 0343  VITAMINB12  --  1,081*  --   FOLATE  --   --  18.3  FERRITIN  --  1,102*  --   TIBC  --  167*  --   IRON  --  8*  --   RETICCTPCT 0.4  --   --    Urine analysis:    Component Value Date/Time   COLORURINE AMBER (A) 08/10/2020 1635   APPEARANCEUR CLOUDY (A) 08/10/2020 1635   LABSPEC 1.024 08/10/2020 1635   PHURINE 7.0 08/10/2020 1635   GLUCOSEU NEGATIVE 08/10/2020 1635   HGBUR SMALL (A) 08/10/2020 1635   BILIRUBINUR NEGATIVE 08/10/2020 1635   KETONESUR NEGATIVE 08/10/2020 1635   PROTEINUR 30 (A) 08/10/2020 1635   UROBILINOGEN 0.2 10/20/2014 1328   NITRITE NEGATIVE 08/10/2020 1635   LEUKOCYTESUR LARGE (A) 08/10/2020 1635   Sepsis Labs: '@LABRCNTIP'$ (procalcitonin:4,lacticidven:4)  ) Recent Results (from the past 240 hour(s))  Urine Culture     Status: Abnormal (Preliminary result)   Collection Time: 08/10/20  3:52 PM    Specimen: In/Out Cath Urine  Result Value Ref Range Status   Specimen Description IN/OUT CATH URINE  Final   Special Requests NONE  Final   Culture (A)  Final    70,000 COLONIES/mL PROTEUS MIRABILIS SUSCEPTIBILITIES TO FOLLOW Performed at [facility], [place] [address] [place], [place] [address]    Report Status PENDING  Incomplete  Blood Culture (routine x 2)     Status: Abnormal (Preliminary result)   Collection Time: 08/10/20  3:52 PM   Specimen: BLOOD RIGHT ARM  Result Value Ref Range Status   Specimen Description BLOOD RIGHT ARM  Final   Special Requests   Final    BOTTLES DRAWN AEROBIC AND ANAEROBIC Blood Culture adequate volume   Culture  Setup Time   Final    GRAM POSITIVE COCCI IN BOTH AEROBIC AND ANAEROBIC BOTTLES CRITICAL VALUE NOTED.  VALUE IS CONSISTENT WITH PREVIOUSLY REPORTED AND CALLED VALUE.    Culture (A)  Final    STAPHYLOCOCCUS AUREUS CULTURE REINCUBATED FOR BETTER GROWTH Performed at [facility], [place] [address] [place], [place] [address]    Report Status PENDING  Incomplete  Resp Panel by RT-PCR (Flu A&B, Covid) Nasopharyngeal Swab     Status: None   Collection Time: 08/10/20  3:53 PM   Specimen: Nasopharyngeal Swab; Nasopharyngeal(NP) swabs in vial transport medium  Result Value Ref Range Status   SARS Coronavirus 2 by RT PCR NEGATIVE NEGATIVE Final    Comment: (NOTE) SARS-CoV-2 target nucleic acids are NOT DETECTED.  The SARS-CoV-2 RNA is generally detectable in upper respiratory specimens during the acute phase of infection. The lowest concentration of SARS-CoV-2 viral copies this assay can detect is 138 copies/mL. A negative result does not preclude SARS-Cov-2 infection and should not be used as the sole basis for treatment or other patient management decisions. A negative result may occur with  improper specimen collection/handling, submission of specimen other than nasopharyngeal swab, presence of viral mutation(s) within the areas  targeted by this assay, and inadequate number of viral copies(<138 copies/mL). A negative result must be combined with clinical observations, patient history, and epidemiological information. The expected result is Negative.  Fact Sheet for Patients:  EntrepreneurPulse.com.au  Fact Sheet for Healthcare Providers:  IncredibleEmployment.be  This test is no t yet approved or cleared by the [place] FDA and  has been authorized for detection and/or diagnosis of SARS-CoV-2 by FDA under an Emergency Use Authorization (EUA). This EUA will remain  in effect (meaning this test can be used) for the duration of the COVID-19 declaration under Section 564(b)(1) of the Act, 21 U.S.C.section 360bbb-3(b)(1), unless the authorization is terminated  or revoked sooner.       Influenza A by PCR NEGATIVE NEGATIVE Final   Influenza B by PCR NEGATIVE NEGATIVE Final    Comment: (NOTE) The Xpert Xpress SARS-CoV-2/FLU/RSV plus assay is intended as an aid in the diagnosis of influenza from Nasopharyngeal swab specimens and should not be used as a sole basis for treatment. Nasal washings and aspirates are unacceptable for Xpert Xpress SARS-CoV-2/FLU/RSV testing.  Fact Sheet for Patients: EntrepreneurPulse.com.au  Fact Sheet for Healthcare Providers: IncredibleEmployment.be  This test is not yet approved or cleared by the [place] FDA and has been authorized for detection and/or diagnosis of SARS-CoV-2 by FDA under an Emergency Use Authorization (EUA). This EUA will remain in effect (meaning this test can be used) for the duration of the COVID-19 declaration under Section 564(b)(1) of the Act, 21 U.S.C. section 360bbb-3(b)(1), unless the authorization is terminated or revoked.  Performed at [facility], [place] [address], [place], [place] [address]   Blood Culture (routine x 2)     Status: Abnormal (Preliminary  result)   Collection Time: 08/10/20  3:57 PM   Specimen: BLOOD LEFT FOREARM  Result Value Ref Range Status   Specimen Description BLOOD LEFT FOREARM  Final   Special Requests   Final    BOTTLES DRAWN AEROBIC AND ANAEROBIC Blood Culture adequate volume   Culture  Setup Time   Final    GRAM POSITIVE COCCI IN BOTH AEROBIC AND ANAEROBIC BOTTLES Organism ID to follow CRITICAL RESULT CALLED TO, READ BACK BY AND VERIFIED WITH: A LAWLESS PHARMD 1358 08/11/20 A BROWNING    Culture (A)  Final    STAPHYLOCOCCUS AUREUS SUSCEPTIBILITIES TO FOLLOW Performed at [facility], [address] [place], [place] [address]    Report Status PENDING  Incomplete  Blood Culture ID Panel (Reflexed)     Status: Abnormal   Collection Time: 08/10/20  3:57 PM  Result Value Ref Range Status   Enterococcus faecalis NOT DETECTED NOT DETECTED Final   Enterococcus Faecium NOT DETECTED NOT DETECTED Final   Listeria monocytogenes NOT DETECTED NOT DETECTED Final   Staphylococcus species DETECTED (A) NOT DETECTED Final    Comment: CRITICAL RESULT CALLED TO, READ BACK BY AND VERIFIED WITH: A LAWLESS PHARMD 1358 08/11/20 A BROWNING    Staphylococcus aureus (BCID) DETECTED (A) NOT DETECTED Final    Comment: CRITICAL RESULT CALLED TO, READ BACK BY AND VERIFIED WITH: A LAWLESS PHARMD 1358 08/11/20 A BROWNING    Staphylococcus epidermidis NOT DETECTED NOT DETECTED Final   Staphylococcus lugdunensis NOT DETECTED NOT DETECTED Final   Streptococcus species NOT DETECTED NOT DETECTED Final   Streptococcus agalactiae NOT DETECTED NOT DETECTED Final   Streptococcus pneumoniae NOT DETECTED NOT DETECTED Final   Streptococcus pyogenes NOT DETECTED NOT DETECTED Final   A.calcoaceticus-baumannii NOT DETECTED NOT DETECTED Final   Bacteroides fragilis NOT DETECTED NOT DETECTED Final   Enterobacterales NOT DETECTED NOT DETECTED Final   Enterobacter cloacae complex NOT DETECTED NOT DETECTED Final   Escherichia coli NOT DETECTED NOT  DETECTED Final   Klebsiella aerogenes NOT DETECTED NOT DETECTED Final   Klebsiella oxytoca NOT DETECTED NOT DETECTED Final   Klebsiella pneumoniae NOT DETECTED NOT DETECTED Final   Proteus species NOT DETECTED NOT DETECTED Final   Salmonella species NOT DETECTED NOT DETECTED Final   Serratia marcescens NOT DETECTED NOT DETECTED Final   Haemophilus influenzae NOT DETECTED NOT DETECTED Final   Neisseria meningitidis NOT DETECTED NOT DETECTED Final   Pseudomonas aeruginosa NOT DETECTED NOT DETECTED Final   Stenotrophomonas maltophilia NOT DETECTED NOT DETECTED Final   Candida albicans NOT DETECTED NOT DETECTED Final   Candida auris NOT DETECTED NOT DETECTED Final   Candida glabrata NOT DETECTED NOT DETECTED Final   Candida krusei NOT DETECTED NOT DETECTED Final   Candida parapsilosis NOT DETECTED NOT DETECTED Final   Candida tropicalis NOT DETECTED NOT DETECTED Final   Cryptococcus neoformans/gattii NOT DETECTED NOT DETECTED Final   Meth resistant mecA/C and MREJ NOT DETECTED NOT DETECTED Final    Comment: Performed at [facility], [address], [place], [place] [address]  Surgical PCR screen     Status: Abnormal   Collection Time: 08/11/20  3:54 PM   Specimen: Nasal Mucosa; Nasal Swab  Result Value Ref Range Status   MRSA, PCR NEGATIVE NEGATIVE Final   Staphylococcus aureus POSITIVE (A) NEGATIVE Final    Comment: (NOTE) The Xpert SA Assay (FDA approved for NASAL specimens in patients 50 years of age and older), is one component of a comprehensive surveillance program. It is not intended to diagnose infection nor to guide or monitor treatment. Performed at [facility], [place] [address], [place], [place] [address]          Radiology Studies: DG Chest Port 1 View  Result Date: 08/10/2020 CLINICAL DATA:  Questionable sepsis. EXAM: PORTABLE CHEST 1 VIEW.  Patient is rotated. COMPARISON:  Chest x-ray 07/16/2020, chest x-ray 10/28/2012 FINDINGS: The heart size and  mediastinal contours are unchanged. Persistent elevation of left hemidiaphragm. Low lung volumes. Left base linear atelectasis versus scarring. No focal consolidation. No pulmonary edema. No pleural effusion. No pneumothorax. No acute osseous abnormality. IMPRESSION: No active disease. Electronically Signed  By: Iven Finn M.D.   On: 08/10/2020 17:06        Scheduled Meds:  baclofen  20 mg Per Tube QID   buPROPion  75 mg Per Tube TID   chlorhexidine  15 mL Mouth Rinse BID   Chlorhexidine Gluconate Cloth  6 each Topical Daily   dantrolene  50 mg Per J Tube QID   enoxaparin (LOVENOX) injection  40 mg Subcutaneous Q24H   famotidine  20 mg Per Tube Daily   feeding supplement (PROSource TF)  45 mL Per Tube TID   free water  240 mL Per Tube Q4H   insulin aspart  0-9 Units Subcutaneous Q4H   insulin detemir  10 Units Subcutaneous Daily   levETIRAcetam  1,200 mg Per Tube TID   mouth rinse  15 mL Mouth Rinse q12n4p   multivitamin with minerals  1 tablet Per Tube Daily   mupirocin ointment  1 application Nasal BID   nutrition supplement (JUVEN)  1 packet Per Tube BID BM   polyvinyl alcohol  1 drop Both Eyes BID   tiZANidine  2 mg Per Tube TID   Continuous Infusions:  sodium chloride 100 mL/hr at 08/12/20 0600   ceFEPime (MAXIPIME) IV 2 g (08/12/20 1006)   feeding supplement (GLUCERNA 1.5 CAL) 53 mL/hr at 08/12/20 0600     LOS: 2 days    Time spent: 11mn  PDomenic Polite MD [facility]   08/12/2020, 11:37 AM

## 2020-08-12 NOTE — Progress Notes (Addendum)
[facility] for [facility]ious Disease    Date of Admission:  08/10/2020   Total days of antibiotics 3   ID: Terry Palmer is a 44 y.o. male with severe MS, hx of sacral decub admitted for fevers found to have MSSA bacteremia Active Problems:   Multiple sclerosis ([place])   Sacral decubitus ulcer, stage II ([place])   UTI (urinary tract infection)   Seizure disorder (HCC)   Quadriplegia and quadriparesis (HCC)   Sepsis ([place])   Suprapubic catheter ([place])   Acute metabolic encephalopathy   Femur fracture ([place])   Hyponatremia   Hypokalemia    Subjective: Still having high fever this morning up to 102F with tachycardia  Addendum: micro lab confirmed e.faecalis and strep mitis on blood cx  Medications:   baclofen  20 mg Per Tube QID   buPROPion  75 mg Per Tube TID   chlorhexidine  15 mL Mouth Rinse BID   Chlorhexidine Gluconate Cloth  6 each Topical Daily   dantrolene  50 mg Per J Tube QID   enoxaparin (LOVENOX) injection  40 mg Subcutaneous Q24H   famotidine  20 mg Per Tube Daily   feeding supplement (PROSource TF)  45 mL Per Tube TID   free water  240 mL Per Tube Q4H   insulin aspart  0-9 Units Subcutaneous Q4H   insulin detemir  10 Units Subcutaneous Daily   levETIRAcetam  1,200 mg Per Tube TID   mouth rinse  15 mL Mouth Rinse q12n4p   multivitamin with minerals  1 tablet Per Tube Daily   mupirocin ointment  1 application Nasal BID   nutrition supplement (JUVEN)  1 packet Per Tube BID BM   polyvinyl alcohol  1 drop Both Eyes BID   tiZANidine  2 mg Per Tube TID    Objective: Vital signs in last 24 hours: Temp:  [98.8 F (37.1 C)-102.8 F (39.3 C)] 99 F (37.2 C) (08/05 1150) Pulse Rate:  [105-142] 106 (08/05 1150) Resp:  [15-24] 23 (08/05 0750) BP: (116-180)/(78-99) 149/97 (08/05 0750) SpO2:  [95 %-100 %] 100 % (08/05 0750) FiO2 (%):  [28 %] 28 % (08/04 1558)  Physical Exam  Constitutional: He is diaphoretic. Contractures to arms and legs and under-nourished. No  distress.  HENT: disconjugate gaze Mouth/Throat: Oropharynx is clear and dry  Cardiovascular: tachy, regular rhythm and normal heart sounds. Exam reveals no gallop and no friction rub.  No murmur heard.  Pulmonary/Chest: Effort normal and breath sounds normal. No respiratory distress. He has no wheezes.  Abdominal: Soft. Bowel sounds are normal. He exhibits no distension. There is no tenderness.  Ext": contractures Skin: Skin is warm and dry. No rash noted. No erythema.     Lab Results Recent Labs    08/11/20 0340 08/12/20 0331  WBC 17.7* 14.5*  HGB 9.0* 9.2*  HCT 28.4* 28.9*  NA 134* 135  K 2.9* 3.6  CL 102 103  CO2 21* 23  BUN 12 10  CREATININE 0.32* <0.30*   Liver Panel Recent Labs    08/10/20 1952 08/11/20 0340  PROT 6.0* 5.9*  ALBUMIN 1.7* 1.6*  AST 28 28  ALT 24 24  ALKPHOS 106 116  BILITOT 0.5 0.5   Sedimentation Rate No results for input(s): ESRSEDRATE in the last 72 hours. C-Reactive Protein No results for input(s): CRP in the last 72 hours.  Microbiology: +MSSA blood cx Studies/Results: DG Chest Port 1 View  Result Date: 08/10/2020 CLINICAL DATA:  Questionable sepsis. EXAM: PORTABLE  CHEST 1 VIEW.  Patient is rotated. COMPARISON:  Chest x-ray 07/16/2020, chest x-ray 10/28/2012 FINDINGS: The heart size and mediastinal contours are unchanged. Persistent elevation of left hemidiaphragm. Low lung volumes. Left base linear atelectasis versus scarring. No focal consolidation. No pulmonary edema. No pleural effusion. No pneumothorax. No acute osseous abnormality. IMPRESSION: No active disease. Electronically Signed   By: Iven Finn M.D.   On: 08/10/2020 17:06     Assessment/Plan: Polymicrobial bacteremia (MSSA, e.faecalis, and strep mitis) initially thought source maybe left ischial osteomyelitis as source . Recommend to change abtx to amp/sub to cover all pathogens identified. We Will repeat blood cx today. Due to underlying MS, unlikely to be candidate for  TEE. Recommend to treat for minimum of 4 wks.  Recommend to repeat abd/pelvic CT to see if any concern for GI source.  Fevers = continue to treat with anti-pyretics  Leukocytosis = improving  Proteus urine cx+ = only 70,000 CFU. But would be covered with cefazolin  Severe MS = continue with his home regimen  [facility] Cell: [phone] Pager: [phone]  08/12/2020, 1:04 PM

## 2020-08-12 NOTE — Progress Notes (Signed)
Patient ID: Terry Palmer, male   DOB: 15-Nov-1975, 45 y.o.   MRN: [id number]       Thank you for this consult. Chart reviewed. Patient briefly seen earlier in the ED. No family at bedside.  Called patient's spouse in efforts to arrange [place] meeting. No answer - secure voicemail left requesting a return call. This NP will follow-up tomorrow to arrange [place] discussion.     Elie Confer, NP-C Palliative Medicine   Please call Palliative Medicine team phone with any questions [phone]. For individual providers please see AMION.   No charge

## 2020-08-13 ENCOUNTER — Inpatient Hospital Stay (HOSPITAL_COMMUNITY): Payer: Medicare HMO

## 2020-08-13 LAB — CBC
HCT: 27.9 % — ABNORMAL LOW (ref 39.0–52.0)
Hemoglobin: 8.9 g/dL — ABNORMAL LOW (ref 13.0–17.0)
MCH: 26.1 pg (ref 26.0–34.0)
MCHC: 31.9 g/dL (ref 30.0–36.0)
MCV: 81.8 fL (ref 80.0–100.0)
Platelets: 407 10*3/uL — ABNORMAL HIGH (ref 150–400)
RBC: 3.41 MIL/uL — ABNORMAL LOW (ref 4.22–5.81)
RDW: 15.8 % — ABNORMAL HIGH (ref 11.5–15.5)
WBC: 19 10*3/uL — ABNORMAL HIGH (ref 4.0–10.5)
nRBC: 0 % (ref 0.0–0.2)

## 2020-08-13 LAB — BASIC METABOLIC PANEL
Anion gap: 11 (ref 5–15)
BUN: 13 mg/dL (ref 6–20)
CO2: 22 mmol/L (ref 22–32)
Calcium: 8.1 mg/dL — ABNORMAL LOW (ref 8.9–10.3)
Chloride: 102 mmol/L (ref 98–111)
Creatinine, Ser: 0.33 mg/dL — ABNORMAL LOW (ref 0.61–1.24)
GFR, Estimated: >60 mL/min (ref >60)
Glucose, Bld: 157 mg/dL — ABNORMAL HIGH (ref 70–99)
Potassium: 3.1 mmol/L — ABNORMAL LOW (ref 3.5–5.1)
Sodium: 135 mmol/L (ref 135–145)

## 2020-08-13 LAB — GLUCOSE, CAPILLARY
Glucose-Capillary: 161 mg/dL — ABNORMAL HIGH (ref 70–99)
Glucose-Capillary: 164 mg/dL — ABNORMAL HIGH (ref 70–99)
Glucose-Capillary: 151 mg/dL — ABNORMAL HIGH (ref 70–99)
Glucose-Capillary: 74 mg/dL (ref 70–99)
Glucose-Capillary: 112 mg/dL — ABNORMAL HIGH (ref 70–99)
Glucose-Capillary: 126 mg/dL — ABNORMAL HIGH (ref 70–99)

## 2020-08-13 LAB — CULTURE, BLOOD (ROUTINE X 2): Special Requests: ADEQUATE

## 2020-08-13 MED ORDER — POTASSIUM CHLORIDE 20 MEQ PO PACK
60.0000 meq | PACK | Freq: Once | ORAL | Status: AC
  Administered 2020-08-13: 60 meq
  Filled 2020-08-13: qty 3

## 2020-08-13 MED ORDER — IOHEXOL 350 MG/ML SOLN
100.0000 mL | Freq: Once | INTRAVENOUS | Status: AC | PRN
  Administered 2020-08-13: 100 mL via INTRAVENOUS

## 2020-08-13 MED ORDER — IOHEXOL 9 MG/ML PO SOLN
ORAL | Status: AC
  Administered 2020-08-13: 500 mL
  Filled 2020-08-13: qty 1000

## 2020-08-13 NOTE — Progress Notes (Signed)
PROGRESS NOTE    Terry Palmer  [id number] DOB: 08/25/1975 DOA: 08/10/2020 PCP: Reymundo Poll, MD  Brief Narrative: 45 year old male with advanced MS, nonverbal, multiple contractures, quadriplegic, with suprapubic catheter, dysphagia with PEG tube, total care was brought to the ED by his family due to fevers, foul-smelling urine, increasing lethargy and somnolence. 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 -Appreciate ID input, CT abdomen pelvis recommended to rule out GI source -Urine appears highly less likely at this time -Antibiotics switched to Unasyn to cover polymicrobial bacteremia -Suprapubic catheter reportedly exchanged the day of admission  Sacral decubitus wounds, poa Left ischial osteomyelitis -Has 1 ischial and a large sacral ulcer,  -Continue wound care, [facility] consult appreciated -See discussion above, regarding erosive changes involving left ischial tuberosity noted on CT abdomen pelvis in May 22  Advanced multiple sclerosis Quadriplegia, dysphagia Neurogenic bladder Nonverbal -Continue home regimen with muscle relaxers -Continue tube feeds -Palliative care consulted, continue DNR, continue conversations regarding hospice and need for comfort focused care  Hyponatremia -Monitor with hydration  Hypokalemia -Replaced  Type 2 diabetes mellitus -continue  tube feeds, continue low-dose Lantus  History of seizures -Continue Keppra  DVT prophylaxis: Lovenox Code Status: Now DNR Family Communication: No family at bedside, left voicemail for wife [name] yesterday and  today Disposition Plan:  Status is: Inpatient  Remains inpatient appropriate because:Inpatient level of care appropriate due to severity of illness  Dispo: The patient is from: Home              Anticipated d/c is to: Home              Patient currently is not medically stable to d/c.   Difficult to place patient No   Consultants:  ID  Procedures:   Antimicrobials:    Subjective: -Afebrile today, otherwise appears unchanged  Objective: Vitals:   08/13/20 0715 08/13/20 0800 08/13/20 0900 08/13/20 1015  BP: 114/73   118/76  Pulse: 99 99 96 (!) 105  Resp: '13 14 13 15  '$ Temp: 98.8 F (37.1 C)     TempSrc: Axillary     SpO2: 100% 100% 99% 99%  Weight:        Intake/Output Summary (Last 24 hours) at 08/13/2020 1105 Last data filed at 08/13/2020 0900 Gross per 24 hour  Intake --  Output 3025 ml  Net -3025 ml   Filed Weights   08/10/20 1600  Weight: 83.9 kg    Examination:  General exam: Chronically ill cachectic contracted obtunded male laying in bed, nonverbal HEENT: No JVD CVS: S1-S2, tachycardic Lungs: Few scattered rhonchi, decreased breath sounds to bases Abdomen: Soft, nontender, PEG tube and suprapubic catheter noted  Extremities, both lower extremities contracted  Skin: Right ischial ulcer and large sacral decubitus wound    Data Reviewed:   CBC: Recent Labs  Lab 08/10/20 1742 08/11/20 0340 08/12/20 0331 08/13/20 0329  WBC 17.3* 17.7* 14.5* 19.0*  NEUTROABS 15.7* 14.8*  --   --   HGB 10.7* 9.0* 9.2* 8.9*  HCT 33.6* 28.4* 28.9* 27.9*  MCV 82.0 81.8 81.9 81.8  PLT 418* 370 378 [id number]*   Basic Metabolic Panel: Recent Labs  Lab 08/10/20 1952 08/11/20 0340  08/12/20 0331 08/13/20 0329  NA 131* 134* 135 135  K 2.9* 2.9* 3.6 3.1*  CL 97* 102 103 102  CO2 22 21* 23 22  GLUCOSE 144* 92 149* 157*  BUN '17 12 10 13  '$ CREATININE 0.37* 0.32* <0.30* 0.33*  CALCIUM 8.0* 8.0* 8.3* 8.1*  MG 1.8 2.1  --   --   PHOS 3.2 3.1  --   --    GFR: Estimated  Creatinine Clearance: 136.9 mL/min (A) (by C-G formula based on SCr of 0.33 mg/dL (L)). Liver Function Tests: Recent Labs  Lab 08/10/20 1952 08/11/20 0340  AST 28 28  ALT 24 24  ALKPHOS 106 116  BILITOT 0.5 0.5  PROT 6.0* 5.9*  ALBUMIN 1.7* 1.6*   No results for input(s): LIPASE, AMYLASE in the last 168 hours. No results for input(s): AMMONIA in the last 168 hours. Coagulation Profile: Recent Labs  Lab 08/10/20 2018  INR 1.2   Cardiac Enzymes: No results for input(s): CKTOTAL, CKMB, CKMBINDEX, TROPONINI in the last 168 hours. BNP (last 3 results) No results for input(s): PROBNP in the last 8760 hours. HbA1C: Recent Labs    08/10/20 1742  HGBA1C 7.1*   CBG: Recent Labs  Lab 08/12/20 1514 08/12/20 1938 08/12/20 2326 08/13/20 0350 08/13/20 0816  GLUCAP 188* 89 164* 161* 164*   Lipid Profile: No results for input(s): CHOL, HDL, LDLCALC, TRIG, CHOLHDL, LDLDIRECT in the last 72 hours. Thyroid Function Tests: Recent Labs    08/11/20 0339  TSH 0.973   Anemia Panel: Recent Labs    08/11/20 0339 08/11/20 0341 08/11/20 0343  VITAMINB12  --  1,081*  --   FOLATE  --   --  18.3  FERRITIN  --  1,102*  --   TIBC  --  167*  --   IRON  --  8*  --   RETICCTPCT 0.4  --   --    Urine analysis:    Component Value Date/Time   COLORURINE AMBER (A) 08/10/2020 1635   APPEARANCEUR CLOUDY (A) 08/10/2020 1635   LABSPEC 1.024 08/10/2020 1635   PHURINE 7.0 08/10/2020 1635   GLUCOSEU NEGATIVE 08/10/2020 1635   HGBUR SMALL (A) 08/10/2020 1635   BILIRUBINUR NEGATIVE 08/10/2020 1635   KETONESUR NEGATIVE 08/10/2020 1635   PROTEINUR 30 (A) 08/10/2020 1635   UROBILINOGEN 0.2 10/20/2014 1328   NITRITE NEGATIVE 08/10/2020 1635   LEUKOCYTESUR LARGE (A) 08/10/2020 1635   Sepsis Labs: '@LABRCNTIP'$ (procalcitonin:4,lacticidven:4)  ) Recent Results (from the past 240 hour(s))  Urine Culture     Status: Abnormal   Collection Time: 08/10/20  3:52 PM   Specimen: In/Out Cath Urine   Result Value Ref Range Status   Specimen Description IN/OUT CATH URINE  Final   Special Requests   Final    NONE Performed at [facility], [place] [address], [place], [place] [address]    Culture 70,000 COLONIES/mL PROTEUS MIRABILIS (A)  Final   Report Status 08/12/2020 FINAL  Final   Organism ID, Bacteria PROTEUS MIRABILIS (A)  Final      Susceptibility   Proteus mirabilis - MIC*    AMPICILLIN <=2 SENSITIVE Sensitive     CEFAZOLIN <=4 SENSITIVE Sensitive     CEFEPIME <=0.12 SENSITIVE Sensitive     CEFTRIAXONE <=0.25 SENSITIVE Sensitive     CIPROFLOXACIN >=4 RESISTANT Resistant     GENTAMICIN 8 INTERMEDIATE Intermediate     IMIPENEM 2 SENSITIVE Sensitive     NITROFURANTOIN 128 RESISTANT Resistant  TRIMETH/SULFA >=320 RESISTANT Resistant     AMPICILLIN/SULBACTAM <=2 SENSITIVE Sensitive     PIP/TAZO <=4 SENSITIVE Sensitive     * 70,000 COLONIES/mL PROTEUS MIRABILIS  Blood Culture (routine x 2)     Status: Abnormal (Preliminary result)   Collection Time: 08/10/20  3:52 PM   Specimen: BLOOD RIGHT ARM  Result Value Ref Range Status   Specimen Description BLOOD RIGHT ARM  Final   Special Requests   Final    BOTTLES DRAWN AEROBIC AND ANAEROBIC Blood Culture adequate volume   Culture  Setup Time   Final    GRAM POSITIVE COCCI IN BOTH AEROBIC AND ANAEROBIC BOTTLES CRITICAL VALUE NOTED.  VALUE IS CONSISTENT WITH PREVIOUSLY REPORTED AND CALLED VALUE. Performed at [name], [place] [address], [place], [place] [address]    Culture (A)  Final    STAPHYLOCOCCUS AUREUS SUSCEPTIBILITIES PERFORMED ON PREVIOUS CULTURE WITHIN THE LAST 5 DAYS. ENTEROCOCCUS FAECALIS SUSCEPTIBILITIES TO FOLLOW STREPTOCOCCUS MITIS/ORALIS    Report Status PENDING  Incomplete  Resp Panel by RT-PCR (Flu A&B, Covid) Nasopharyngeal Swab     Status: None   Collection Time: 08/10/20  3:53 PM   Specimen: Nasopharyngeal Swab; Nasopharyngeal(NP) swabs in vial transport medium  Result Value Ref Range  Status   SARS Coronavirus 2 by RT PCR NEGATIVE NEGATIVE Final    Comment: (NOTE) SARS-CoV-2 target nucleic acids are NOT DETECTED.  The SARS-CoV-2 RNA is generally detectable in upper respiratory specimens during the acute phase of infection. The lowest concentration of SARS-CoV-2 viral copies this assay can detect is 138 copies/mL. A negative result does not preclude SARS-Cov-2 infection and should not be used as the sole basis for treatment or other patient management decisions. A negative result may occur with  improper specimen collection/handling, submission of specimen other than nasopharyngeal swab, presence of viral mutation(s) within the areas targeted by this assay, and inadequate number of viral copies(<138 copies/mL). A negative result must be combined with clinical observations, patient history, and epidemiological information. The expected result is Negative.  Fact Sheet for Patients:  EntrepreneurPulse.com.au  Fact Sheet for Healthcare Providers:  IncredibleEmployment.be  This test is no t yet approved or cleared by the [place] FDA and  has been authorized for detection and/or diagnosis of SARS-CoV-2 by FDA under an Emergency Use Authorization (EUA). This EUA will remain  in effect (meaning this test can be used) for the duration of the COVID-19 declaration under Section 564(b)(1) of the Act, 21 U.S.C.section 360bbb-3(b)(1), unless the authorization is terminated  or revoked sooner.       Influenza A by PCR NEGATIVE NEGATIVE Final   Influenza B by PCR NEGATIVE NEGATIVE Final    Comment: (NOTE) The Xpert Xpress SARS-CoV-2/FLU/RSV plus assay is intended as an aid in the diagnosis of influenza from Nasopharyngeal swab specimens and should not be used as a sole basis for treatment. Nasal washings and aspirates are unacceptable for Xpert Xpress SARS-CoV-2/FLU/RSV testing.  Fact Sheet for  Patients: EntrepreneurPulse.com.au  Fact Sheet for Healthcare Providers: IncredibleEmployment.be  This test is not yet approved or cleared by the [place] FDA and has been authorized for detection and/or diagnosis of SARS-CoV-2 by FDA under an Emergency Use Authorization (EUA). This EUA will remain in effect (meaning this test can be used) for the duration of the COVID-19 declaration under Section 564(b)(1) of the Act, 21 U.S.C. section 360bbb-3(b)(1), unless the authorization is terminated or revoked.  Performed at [facility], [place] [address], [place], [place]  [address]   Blood Culture (routine x 2)     Status: Abnormal   Collection Time: 08/10/20  3:57 PM   Specimen: BLOOD LEFT FOREARM  Result Value Ref Range Status   Specimen Description BLOOD LEFT FOREARM  Final   Special Requests   Final    BOTTLES DRAWN AEROBIC AND ANAEROBIC Blood Culture adequate volume   Culture  Setup Time   Final    GRAM POSITIVE COCCI IN BOTH AEROBIC AND ANAEROBIC BOTTLES Organism ID to follow CRITICAL RESULT CALLED TO, READ BACK BY AND VERIFIED WITH: A LAWLESS PHARMD 1358 08/11/20 A BROWNING Performed at [facility], [place] [address], [place], [place] [address]    Culture STAPHYLOCOCCUS AUREUS (A)  Final   Report Status 08/13/2020 FINAL  Final   Organism ID, Bacteria STAPHYLOCOCCUS AUREUS  Final      Susceptibility   Staphylococcus aureus - MIC*    CIPROFLOXACIN <=0.5 SENSITIVE Sensitive     ERYTHROMYCIN >=8 RESISTANT Resistant     GENTAMICIN <=0.5 SENSITIVE Sensitive     OXACILLIN <=0.25 SENSITIVE Sensitive     TETRACYCLINE <=1 SENSITIVE Sensitive     VANCOMYCIN <=0.5 SENSITIVE Sensitive     TRIMETH/SULFA <=10 SENSITIVE Sensitive     CLINDAMYCIN RESISTANT Resistant     RIFAMPIN <=0.5 SENSITIVE Sensitive     Inducible Clindamycin POSITIVE Resistant     * STAPHYLOCOCCUS AUREUS  Blood Culture ID Panel (Reflexed)     Status: Abnormal    Collection Time: 08/10/20  3:57 PM  Result Value Ref Range Status   Enterococcus faecalis NOT DETECTED NOT DETECTED Final   Enterococcus Faecium NOT DETECTED NOT DETECTED Final   Listeria monocytogenes NOT DETECTED NOT DETECTED Final   Staphylococcus species DETECTED (A) NOT DETECTED Final    Comment: CRITICAL RESULT CALLED TO, READ BACK BY AND VERIFIED WITH: A LAWLESS PHARMD 1358 08/11/20 A BROWNING    Staphylococcus aureus (BCID) DETECTED (A) NOT DETECTED Final    Comment: CRITICAL RESULT CALLED TO, READ BACK BY AND VERIFIED WITH: A LAWLESS PHARMD 1358 08/11/20 A BROWNING    Staphylococcus epidermidis NOT DETECTED NOT DETECTED Final   Staphylococcus lugdunensis NOT DETECTED NOT DETECTED Final   Streptococcus species NOT DETECTED NOT DETECTED Final   Streptococcus agalactiae NOT DETECTED NOT DETECTED Final   Streptococcus pneumoniae NOT DETECTED NOT DETECTED Final   Streptococcus pyogenes NOT DETECTED NOT DETECTED Final   A.calcoaceticus-baumannii NOT DETECTED NOT DETECTED Final   Bacteroides fragilis NOT DETECTED NOT DETECTED Final   Enterobacterales NOT DETECTED NOT DETECTED Final   Enterobacter cloacae complex NOT DETECTED NOT DETECTED Final   Escherichia coli NOT DETECTED NOT DETECTED Final   Klebsiella aerogenes NOT DETECTED NOT DETECTED Final   Klebsiella oxytoca NOT DETECTED NOT DETECTED Final   Klebsiella pneumoniae NOT DETECTED NOT DETECTED Final   Proteus species NOT DETECTED NOT DETECTED Final   Salmonella species NOT DETECTED NOT DETECTED Final   Serratia marcescens NOT DETECTED NOT DETECTED Final   Haemophilus influenzae NOT DETECTED NOT DETECTED Final   Neisseria meningitidis NOT DETECTED NOT DETECTED Final   Pseudomonas aeruginosa NOT DETECTED NOT DETECTED Final   Stenotrophomonas maltophilia NOT DETECTED NOT DETECTED Final   Candida albicans NOT DETECTED NOT DETECTED Final   Candida auris NOT DETECTED NOT DETECTED Final   Candida glabrata NOT DETECTED NOT DETECTED  Final   Candida krusei NOT DETECTED NOT DETECTED Final   Candida parapsilosis NOT DETECTED NOT DETECTED Final   Candida tropicalis NOT DETECTED NOT DETECTED Final  Cryptococcus neoformans/gattii NOT DETECTED NOT DETECTED Final   Meth resistant mecA/C and MREJ NOT DETECTED NOT DETECTED Final    Comment: Performed at [facility], [place] [address] [place], [place] [address]  Surgical PCR screen     Status: Abnormal   Collection Time: 08/11/20  3:54 PM   Specimen: Nasal Mucosa; Nasal Swab  Result Value Ref Range Status   MRSA, PCR NEGATIVE NEGATIVE Final   Staphylococcus aureus POSITIVE (A) NEGATIVE Final    Comment: (NOTE) The Xpert SA Assay (FDA approved for NASAL specimens in patients 34 years of age and older), is one component of a comprehensive surveillance program. It is not intended to diagnose infection nor to guide or monitor treatment. Performed at [facility], [place] [address] [place], [place] [address]   Culture, blood (Routine X 2) w Reflex to ID Panel     Status: None (Preliminary result)   Collection Time: 08/12/20 11:33 AM   Specimen: BLOOD RIGHT HAND  Result Value Ref Range Status   Specimen Description BLOOD RIGHT HAND  Final   Special Requests   Final    BOTTLES DRAWN AEROBIC ONLY Blood Culture adequate volume   Culture   Final    NO GROWTH < 24 HOURS Performed at [facility], [place] [address] [place], [place] [address]    Report Status PENDING  Incomplete  Culture, blood (Routine X 2) w Reflex to ID Panel     Status: None (Preliminary result)   Collection Time: 08/12/20 11:35 AM   Specimen: BLOOD LEFT HAND  Result Value Ref Range Status   Specimen Description BLOOD LEFT HAND  Final   Special Requests   Final    BOTTLES DRAWN AEROBIC ONLY Blood Culture adequate volume   Culture   Final    NO GROWTH < 24 HOURS Performed at [facility], [place] [address] [place], [place] [address]    Report Status PENDING  Incomplete          Radiology Studies: No results found.      Scheduled Meds:  baclofen  20 mg Per Tube QID   buPROPion  75 mg Per Tube TID   chlorhexidine  15 mL Mouth Rinse BID   Chlorhexidine Gluconate Cloth  6 each Topical Daily   dantrolene  50 mg Per J Tube QID   enoxaparin (LOVENOX) injection  40 mg Subcutaneous Q24H   famotidine  20 mg Per Tube Daily   feeding supplement (PROSource TF)  45 mL Per Tube TID   free water  150 mL Per Tube Q4H   insulin aspart  0-9 Units Subcutaneous Q4H   insulin detemir  10 Units Subcutaneous Daily   levETIRAcetam  1,200 mg Per Tube TID   mouth rinse  15 mL Mouth Rinse q12n4p   multivitamin with minerals  1 tablet Per Tube Daily   mupirocin ointment  1 application Nasal BID   nutrition supplement (JUVEN)  1 packet Per Tube BID BM   polyvinyl alcohol  1 drop Both Eyes BID   tiZANidine  2 mg Per Tube TID   Continuous Infusions:  sodium chloride 75 mL/hr at 08/12/20 2233   ampicillin-sulbactam (UNASYN) IV 3 g (08/13/20 1005)   feeding supplement (GLUCERNA 1.5 CAL) 50 mL/hr at 08/13/20 0440     LOS: 3 days    Time spent: 32mn  PDomenic Polite MD [facility]   08/13/2020, 11:05 AM

## 2020-08-14 LAB — CBC
HCT: 29.9 % — ABNORMAL LOW (ref 39.0–52.0)
Hemoglobin: 9.4 g/dL — ABNORMAL LOW (ref 13.0–17.0)
MCH: 25.8 pg — ABNORMAL LOW (ref 26.0–34.0)
MCHC: 31.4 g/dL (ref 30.0–36.0)
MCV: 82.1 fL (ref 80.0–100.0)
Platelets: 546 10*3/uL — ABNORMAL HIGH (ref 150–400)
RBC: 3.64 MIL/uL — ABNORMAL LOW (ref 4.22–5.81)
RDW: 16 % — ABNORMAL HIGH (ref 11.5–15.5)
WBC: 12.6 10*3/uL — ABNORMAL HIGH (ref 4.0–10.5)
nRBC: 0 % (ref 0.0–0.2)

## 2020-08-14 LAB — GLUCOSE, CAPILLARY
Glucose-Capillary: 137 mg/dL — ABNORMAL HIGH (ref 70–99)
Glucose-Capillary: 144 mg/dL — ABNORMAL HIGH (ref 70–99)
Glucose-Capillary: 157 mg/dL — ABNORMAL HIGH (ref 70–99)
Glucose-Capillary: 163 mg/dL — ABNORMAL HIGH (ref 70–99)
Glucose-Capillary: 139 mg/dL — ABNORMAL HIGH (ref 70–99)
Glucose-Capillary: 112 mg/dL — ABNORMAL HIGH (ref 70–99)
Glucose-Capillary: 117 mg/dL — ABNORMAL HIGH (ref 70–99)

## 2020-08-14 LAB — BASIC METABOLIC PANEL
Anion gap: 11 (ref 5–15)
BUN: 12 mg/dL (ref 6–20)
CO2: 22 mmol/L (ref 22–32)
Calcium: 8.1 mg/dL — ABNORMAL LOW (ref 8.9–10.3)
Chloride: 103 mmol/L (ref 98–111)
Creatinine, Ser: 0.31 mg/dL — ABNORMAL LOW (ref 0.61–1.24)
GFR, Estimated: >60 mL/min (ref >60)
Glucose, Bld: 133 mg/dL — ABNORMAL HIGH (ref 70–99)
Potassium: 4 mmol/L (ref 3.5–5.1)
Sodium: 136 mmol/L (ref 135–145)

## 2020-08-14 LAB — CULTURE, BLOOD (ROUTINE X 2): Special Requests: ADEQUATE

## 2020-08-14 NOTE — Progress Notes (Signed)
PROGRESS NOTE    Terry Palmer  [id number] DOB: 12-Jul-1975 DOA: 08/10/2020 PCP: Reymundo Poll, MD  Brief Narrative: 45 year old male with advanced MS, nonverbal, multiple contractures, quadriplegic, with suprapubic catheter, dysphagia with PEG tube, total care was brought to the ED by his family due to fevers, foul-smelling urine, increasing lethargy and somnolence. 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 -Appreciate ID input, CT abdomen pelvis recommended to rule out GI source, CT notes osteomyelitis -Urinary source less likely at this time -Antibiotics switched to Unasyn to cover polymicrobial bacteremia -Blood cultures repeated today -He will be unable to have a TEE -Palliative consulted as well and following, now DNR, had a long discussion with wife regarding consideration of hospice services  Sacral decubitus wounds, poa Left ischial osteomyelitis, acetabular osteomyelitis -Has 1 ischial and a large sacral ulcer,  -Continue wound care, [facility] consult appreciated -See discussion above, regarding erosive changes involving left ischial tuberosity noted on CT abdomen pelvis in May 22 -Repeat CT 8/6: Enlarged LEFT decubitus ulcer is now contiguous with the LEFT hip and femoral head. Fragmentation of the acetabulum is suspicious for osteomyelitis  Advanced multiple sclerosis Quadriplegia, dysphagia Neurogenic bladder Nonverbal -Continue home regimen with muscle relaxers -Continue tube feeds -Palliative care consulted, continue DNR, continue conversations regarding hospice and need for comfort  focused care -Suprapubic catheter was changed on 8/3 per spouse  Hyponatremia -Monitor with hydration  Hypokalemia -Replaced  Type 2 diabetes mellitus -continue  tube feeds, continue low-dose Lantus  History of seizures -Continue Keppra  DVT prophylaxis: Lovenox Code Status: Now DNR Family Communication: No family at bedside, left voicemail for wife [name] yesterday and today Disposition Plan:  Status is: Inpatient  Remains inpatient appropriate because:Inpatient level of care appropriate due to severity of illness  Dispo: The patient is from: Home              Anticipated d/c is to: Home              Patient currently is not medically stable to d/c.   Difficult to place patient No   Consultants:  ID  Procedures:   Antimicrobials:    Subjective: -Heart rate remains elevated, afebrile today, otherwise unchanged, tolerating tube feeds  Objective: Vitals:   08/13/20 2328 08/14/20 0345 08/14/20 0659 08/14/20 1035  BP: 96/60 119/80 123/75 137/90  Pulse: (!) 103 (!) 110 (!) 106 (!) 112  Resp: '16 15 16 20  '$ Temp: 99.1 F (37.3 C) 98.8 F (37.1 C) 98.5 F (36.9 C) 99.3 F (37.4 C)  TempSrc: Axillary Axillary Oral Axillary  SpO2:   98% 100%  Weight:        Intake/Output Summary (Last 24 hours) at 08/14/2020 1103 Last data filed at 08/14/2020 0415 Gross per 24 hour  Intake 7077.62 ml  Output 4275 ml  Net 2802.62 ml   Filed Weights   08/10/20 1600  Weight: 83.9 kg    Examination:  General exam: Chronically ill debilitated cachectic contracted male, nonverbal, laying in bed HEENT: No JVD CVS: S1-S2, tachycardic Lungs: Few scattered rhonchi, decreased breath sounds the bases Abdomen: Soft, nontender, PEG tube and suprapubic catheter noted Extremities: Both lower extremities are contracted  Skin: Right ischial  ulcer and large sacral decubitus wound    Data Reviewed:   CBC: Recent Labs  Lab 08/10/20 1742 08/11/20 0340 08/12/20 0331 08/13/20 0329  08/14/20 0156  WBC 17.3* 17.7* 14.5* 19.0* 12.6*  NEUTROABS 15.7* 14.8*  --   --   --   HGB 10.7* 9.0* 9.2* 8.9* 9.4*  HCT 33.6* 28.4* 28.9* 27.9* 29.9*  MCV 82.0 81.8 81.9 81.8 82.1  PLT 418* 370 378 407* 0000000*   Basic Metabolic Panel: Recent Labs  Lab 08/10/20 1952 08/11/20 0340 08/12/20 0331 08/13/20 0329 08/14/20 0156  NA 131* 134* 135 135 136  K 2.9* 2.9* 3.6 3.1* 4.0  CL 97* 102 103 102 103  CO2 22 21* '23 22 22  '$ GLUCOSE 144* 92 149* 157* 133*  BUN '17 12 10 13 12  '$ CREATININE 0.37* 0.32* <0.30* 0.33* 0.31*  CALCIUM 8.0* 8.0* 8.3* 8.1* 8.1*  MG 1.8 2.1  --   --   --   PHOS 3.2 3.1  --   --   --    GFR: Estimated Creatinine Clearance: 136.9 mL/min (A) (by C-G formula based on SCr of 0.31 mg/dL (L)). Liver Function Tests: Recent Labs  Lab 08/10/20 1952 08/11/20 0340  AST 28 28  ALT 24 24  ALKPHOS 106 116  BILITOT 0.5 0.5  PROT 6.0* 5.9*  ALBUMIN 1.7* 1.6*   No results for input(s): LIPASE, AMYLASE in the last 168 hours. No results for input(s): AMMONIA in the last 168 hours. Coagulation Profile: Recent Labs  Lab 08/10/20 2018  INR 1.2   Cardiac Enzymes: No results for input(s): CKTOTAL, CKMB, CKMBINDEX, TROPONINI in the last 168 hours. BNP (last 3 results) No results for input(s): PROBNP in the last 8760 hours. HbA1C: No results for input(s): HGBA1C in the last 72 hours.  CBG: Recent Labs  Lab 08/13/20 1933 08/13/20 2325 08/14/20 0326 08/14/20 0752 08/14/20 1052  GLUCAP 112* 126* 137* 144* 157*   Lipid Profile: No results for input(s): CHOL, HDL, LDLCALC, TRIG, CHOLHDL, LDLDIRECT in the last 72 hours. Thyroid Function Tests: No results for input(s): TSH, T4TOTAL, FREET4, T3FREE, THYROIDAB in the last 72 hours.  Anemia Panel: No results for input(s): VITAMINB12, FOLATE, FERRITIN, TIBC, IRON, RETICCTPCT in the last 72 hours.  Urine analysis:    Component Value Date/Time   COLORURINE AMBER (A) 08/10/2020 1635   APPEARANCEUR CLOUDY (A)  08/10/2020 1635   LABSPEC 1.024 08/10/2020 1635   PHURINE 7.0 08/10/2020 1635   GLUCOSEU NEGATIVE 08/10/2020 1635   HGBUR SMALL (A) 08/10/2020 1635   BILIRUBINUR NEGATIVE 08/10/2020 1635   KETONESUR NEGATIVE 08/10/2020 1635   PROTEINUR 30 (A) 08/10/2020 1635   UROBILINOGEN 0.2 10/20/2014 1328   NITRITE NEGATIVE 08/10/2020 1635   LEUKOCYTESUR LARGE (A) 08/10/2020 1635   Sepsis Labs: '@LABRCNTIP'$ (procalcitonin:4,lacticidven:4)  ) Recent Results (from the past 240 hour(s))  Urine Culture     Status: Abnormal   Collection Time: 08/10/20  3:52 PM   Specimen: In/Out Cath Urine  Result Value Ref Range Status   Specimen Description IN/OUT CATH URINE  Final   Special Requests   Final    NONE Performed at [facility], [place] [address] [place], [place] [address]    Culture 70,000 COLONIES/mL PROTEUS MIRABILIS (A)  Final   Report Status 08/12/2020 FINAL  Final   Organism ID, Bacteria PROTEUS MIRABILIS (A)  Final      Susceptibility   Proteus mirabilis - MIC*    AMPICILLIN <=2 SENSITIVE Sensitive  CEFAZOLIN <=4 SENSITIVE Sensitive     CEFEPIME <=0.12 SENSITIVE Sensitive     CEFTRIAXONE <=0.25 SENSITIVE Sensitive     CIPROFLOXACIN >=4 RESISTANT Resistant     GENTAMICIN 8 INTERMEDIATE Intermediate     IMIPENEM 2 SENSITIVE Sensitive     NITROFURANTOIN 128 RESISTANT Resistant     TRIMETH/SULFA >=320 RESISTANT Resistant     AMPICILLIN/SULBACTAM <=2 SENSITIVE Sensitive     PIP/TAZO <=4 SENSITIVE Sensitive     * 70,000 COLONIES/mL PROTEUS MIRABILIS  Blood Culture (routine x 2)     Status: Abnormal   Collection Time: 08/10/20  3:52 PM   Specimen: BLOOD RIGHT ARM  Result Value Ref Range Status   Specimen Description BLOOD RIGHT ARM  Final   Special Requests   Final    BOTTLES DRAWN AEROBIC AND ANAEROBIC Blood Culture adequate volume   Culture  Setup Time   Final    GRAM POSITIVE COCCI IN BOTH AEROBIC AND ANAEROBIC BOTTLES CRITICAL VALUE NOTED.  VALUE IS CONSISTENT WITH  PREVIOUSLY REPORTED AND CALLED VALUE.    Culture (A)  Final    STAPHYLOCOCCUS AUREUS SUSCEPTIBILITIES PERFORMED ON PREVIOUS CULTURE WITHIN THE LAST 5 DAYS. ENTEROCOCCUS FAECALIS STREPTOCOCCUS MITIS/ORALIS THE SIGNIFICANCE OF ISOLATING THIS ORGANISM FROM A SINGLE SET OF BLOOD CULTURES WHEN MULTIPLE SETS ARE DRAWN IS UNCERTAIN. PLEASE NOTIFY THE MICROBIOLOGY DEPARTMENT WITHIN ONE WEEK IF SPECIATION AND SENSITIVITIES ARE REQUIRED. Performed at [facility], [place] [address] [place], [place] [address]    Report Status 08/14/2020 FINAL  Final   Organism ID, Bacteria ENTEROCOCCUS FAECALIS  Final      Susceptibility   Enterococcus faecalis - MIC*    AMPICILLIN <=2 SENSITIVE Sensitive     VANCOMYCIN 1 SENSITIVE Sensitive     GENTAMICIN SYNERGY RESISTANT Resistant     * ENTEROCOCCUS FAECALIS  Resp Panel by RT-PCR (Flu A&B, Covid) Nasopharyngeal Swab     Status: None   Collection Time: 08/10/20  3:53 PM   Specimen: Nasopharyngeal Swab; Nasopharyngeal(NP) swabs in vial transport medium  Result Value Ref Range Status   SARS Coronavirus 2 by RT PCR NEGATIVE NEGATIVE Final    Comment: (NOTE) SARS-CoV-2 target nucleic acids are NOT DETECTED.  The SARS-CoV-2 RNA is generally detectable in upper respiratory specimens during the acute phase of infection. The lowest concentration of SARS-CoV-2 viral copies this assay can detect is 138 copies/mL. A negative result does not preclude SARS-Cov-2 infection and should not be used as the sole basis for treatment or other patient management decisions. A negative result may occur with  improper specimen collection/handling, submission of specimen other than nasopharyngeal swab, presence of viral mutation(s) within the areas targeted by this assay, and inadequate number of viral copies(<138 copies/mL). A negative result must be combined with clinical observations, patient history, and epidemiological information. The expected result is Negative.  Fact  Sheet for Patients:  EntrepreneurPulse.com.au  Fact Sheet for Healthcare Providers:  IncredibleEmployment.be  This test is no t yet approved or cleared by the [place] FDA and  has been authorized for detection and/or diagnosis of SARS-CoV-2 by FDA under an Emergency Use Authorization (EUA). This EUA will remain  in effect (meaning this test can be used) for the duration of the COVID-19 declaration under Section 564(b)(1) of the Act, 21 U.S.C.section 360bbb-3(b)(1), unless the authorization is terminated  or revoked sooner.       Influenza A by PCR NEGATIVE NEGATIVE Final   Influenza B by PCR NEGATIVE NEGATIVE Final    Comment: (  NOTE) The Xpert Xpress SARS-CoV-2/FLU/RSV plus assay is intended as an aid in the diagnosis of influenza from Nasopharyngeal swab specimens and should not be used as a sole basis for treatment. Nasal washings and aspirates are unacceptable for Xpert Xpress SARS-CoV-2/FLU/RSV testing.  Fact Sheet for Patients: EntrepreneurPulse.com.au  Fact Sheet for Healthcare Providers: IncredibleEmployment.be  This test is not yet approved or cleared by the [place] FDA and has been authorized for detection and/or diagnosis of SARS-CoV-2 by FDA under an Emergency Use Authorization (EUA). This EUA will remain in effect (meaning this test can be used) for the duration of the COVID-19 declaration under Section 564(b)(1) of the Act, 21 U.S.C. section 360bbb-3(b)(1), unless the authorization is terminated or revoked.  Performed at [facility], [place] [address] [place], [place] [address]   Blood Culture (routine x 2)     Status: Abnormal   Collection Time: 08/10/20  3:57 PM   Specimen: BLOOD LEFT FOREARM  Result Value Ref Range Status   Specimen Description BLOOD LEFT FOREARM  Final   Special Requests   Final    BOTTLES DRAWN AEROBIC AND ANAEROBIC Blood Culture adequate volume    Culture  Setup Time   Final    GRAM POSITIVE COCCI IN BOTH AEROBIC AND ANAEROBIC BOTTLES Organism ID to follow CRITICAL RESULT CALLED TO, READ BACK BY AND VERIFIED WITH: A LAWLESS PHARMD 1358 08/11/20 A BROWNING Performed at [facility], [place] [address], [place], [place] [address]    Culture STAPHYLOCOCCUS AUREUS (A)  Final   Report Status 08/13/2020 FINAL  Final   Organism ID, Bacteria STAPHYLOCOCCUS AUREUS  Final      Susceptibility   Staphylococcus aureus - MIC*    CIPROFLOXACIN <=0.5 SENSITIVE Sensitive     ERYTHROMYCIN >=8 RESISTANT Resistant     GENTAMICIN <=0.5 SENSITIVE Sensitive     OXACILLIN <=0.25 SENSITIVE Sensitive     TETRACYCLINE <=1 SENSITIVE Sensitive     VANCOMYCIN <=0.5 SENSITIVE Sensitive     TRIMETH/SULFA <=10 SENSITIVE Sensitive     CLINDAMYCIN RESISTANT Resistant     RIFAMPIN <=0.5 SENSITIVE Sensitive     Inducible Clindamycin POSITIVE Resistant     * STAPHYLOCOCCUS AUREUS  Blood Culture ID Panel (Reflexed)     Status: Abnormal   Collection Time: 08/10/20  3:57 PM  Result Value Ref Range Status   Enterococcus faecalis NOT DETECTED NOT DETECTED Final   Enterococcus Faecium NOT DETECTED NOT DETECTED Final   Listeria monocytogenes NOT DETECTED NOT DETECTED Final   Staphylococcus species DETECTED (A) NOT DETECTED Final    Comment: CRITICAL RESULT CALLED TO, READ BACK BY AND VERIFIED WITH: A LAWLESS PHARMD 1358 08/11/20 A BROWNING    Staphylococcus aureus (BCID) DETECTED (A) NOT DETECTED Final    Comment: CRITICAL RESULT CALLED TO, READ BACK BY AND VERIFIED WITH: A LAWLESS PHARMD 1358 08/11/20 A BROWNING    Staphylococcus epidermidis NOT DETECTED NOT DETECTED Final   Staphylococcus lugdunensis NOT DETECTED NOT DETECTED Final   Streptococcus species NOT DETECTED NOT DETECTED Final   Streptococcus agalactiae NOT DETECTED NOT DETECTED Final   Streptococcus pneumoniae NOT DETECTED NOT DETECTED Final   Streptococcus pyogenes NOT DETECTED NOT DETECTED Final    A.calcoaceticus-baumannii NOT DETECTED NOT DETECTED Final   Bacteroides fragilis NOT DETECTED NOT DETECTED Final   Enterobacterales NOT DETECTED NOT DETECTED Final   Enterobacter cloacae complex NOT DETECTED NOT DETECTED Final   Escherichia coli NOT DETECTED NOT DETECTED Final   Klebsiella aerogenes NOT DETECTED NOT  DETECTED Final   Klebsiella oxytoca NOT DETECTED NOT DETECTED Final   Klebsiella pneumoniae NOT DETECTED NOT DETECTED Final   Proteus species NOT DETECTED NOT DETECTED Final   Salmonella species NOT DETECTED NOT DETECTED Final   Serratia marcescens NOT DETECTED NOT DETECTED Final   Haemophilus influenzae NOT DETECTED NOT DETECTED Final   Neisseria meningitidis NOT DETECTED NOT DETECTED Final   Pseudomonas aeruginosa NOT DETECTED NOT DETECTED Final   Stenotrophomonas maltophilia NOT DETECTED NOT DETECTED Final   Candida albicans NOT DETECTED NOT DETECTED Final   Candida auris NOT DETECTED NOT DETECTED Final   Candida glabrata NOT DETECTED NOT DETECTED Final   Candida krusei NOT DETECTED NOT DETECTED Final   Candida parapsilosis NOT DETECTED NOT DETECTED Final   Candida tropicalis NOT DETECTED NOT DETECTED Final   Cryptococcus neoformans/gattii NOT DETECTED NOT DETECTED Final   Meth resistant mecA/C and MREJ NOT DETECTED NOT DETECTED Final    Comment: Performed at [facility], [place] [address], [place], [place] [address]  Surgical PCR screen     Status: Abnormal   Collection Time: 08/11/20  3:54 PM   Specimen: Nasal Mucosa; Nasal Swab  Result Value Ref Range Status   MRSA, PCR NEGATIVE NEGATIVE Final   Staphylococcus aureus POSITIVE (A) NEGATIVE Final    Comment: (NOTE) The Xpert SA Assay (FDA approved for NASAL specimens in patients 35 years of age and older), is one component of a comprehensive surveillance program. It is not intended to diagnose infection nor to guide or monitor treatment. Performed at [facility], [place] [address], [place],  [place] [address]   Culture, blood (Routine X 2) w Reflex to ID Panel     Status: None (Preliminary result)   Collection Time: 08/12/20 11:33 AM   Specimen: BLOOD RIGHT HAND  Result Value Ref Range Status   Specimen Description BLOOD RIGHT HAND  Final   Special Requests   Final    BOTTLES DRAWN AEROBIC ONLY Blood Culture adequate volume   Culture  Setup Time   Final    GRAM POSITIVE COCCI IN CLUSTERS AEROBIC BOTTLE ONLY CRITICAL VALUE NOTED.  VALUE IS CONSISTENT WITH PREVIOUSLY REPORTED AND CALLED VALUE. Performed at [facility], [place] [address], [place], [place] [address]    Culture GRAM POSITIVE COCCI  Final   Report Status PENDING  Incomplete  Culture, blood (Routine X 2) w Reflex to ID Panel     Status: None (Preliminary result)   Collection Time: 08/12/20 11:35 AM   Specimen: BLOOD LEFT HAND  Result Value Ref Range Status   Specimen Description BLOOD LEFT HAND  Final   Special Requests   Final    BOTTLES DRAWN AEROBIC ONLY Blood Culture adequate volume   Culture  Setup Time   Final    GRAM POSITIVE COCCI IN CLUSTERS AEROBIC BOTTLE ONLY CRITICAL VALUE NOTED.  VALUE IS CONSISTENT WITH PREVIOUSLY REPORTED AND CALLED VALUE. Performed at [facility], [place] [address] [place], [place] [address]    Culture [facility] POSITIVE COCCI  Final   Report Status PENDING  Incomplete         Radiology Studies: CT ABDOMEN PELVIS W CONTRAST  Result Date: 08/13/2020 CLINICAL DATA:  Sepsis. On acromial bacteremia. Evaluate for GI source. Chronic osteomyelitis from chronic sacral decubitus wounds. EXAM: CT ABDOMEN AND PELVIS WITH CONTRAST TECHNIQUE: Multidetector CT imaging of the abdomen and pelvis was performed using the standard protocol following bolus administration of intravenous contrast. CONTRAST:  162m OMNIPAQUE IOHEXOL 350 MG/ML SOLN COMPARISON:  CT of the abdomen and pelvis on 05/18/2020 FINDINGS: Lower chest: Bibasilar atelectasis or scarring. The heart size is normal.  Hepatobiliary: The liver is unremarkable. Layering gallstones are identified in the gallbladder which is otherwise normal in appearance. Pancreas: Unremarkable. No pancreatic ductal dilatation or surrounding inflammatory changes. Spleen: Normal in size without focal abnormality. Adrenals/Urinary Tract: Adrenal glands are normal. Kidneys are unremarkable. No hydronephrosis. Punctate 1-2 millimeter intrarenal calculi are identified in the UPPER pole of the RIGHT kidney. A 2 millimeter calculus is identified within the distal LEFT ureter, image 100 of series 3 and image 79 of series 6. A suprapubic catheter is identified within the urinary bladder which is otherwise decompressed. Stomach/Bowel: Stomach is normal in appearance. Gastrostomy tube is in good position. Small bowel loops are unremarkable. The appendix is well seen and has a normal appearance. Colon is unremarkable. No bowel wall thickening or evidence for bowel inflammation. Vascular/Lymphatic: There is atherosclerotic calcification of the abdominal aorta. No aneurysm. Small LEFT iliac lymph nodes are present, likely reactive. Otherwise, no significant adenopathy. Reproductive: Prostate is unremarkable. Other: Large LEFT sacral decubitus ulcer extends to LEFT hip and has increased in size. There is increased soft tissue density surrounding the LEFT hip. Large decubitus wound is now contiguous with the LEFT hip, measures 6.2 x 2.2 centimeters and extends to the LEFT femoral head. There is increased fragmentation of the LEFT acetabulum, suspicious for osteomyelitis. Bilateral chronic hip dysplasia. There is scarring in the region of the RIGHT ischium, consistent with remote or developing decubitus ulcer. Musculoskeletal: Bilateral hip dysplasia. There is increased soft tissue density surrounding the LEFT hip. Large decubitus wound is now contiguous with the LEFT hip, measures 6.2 x 2.2 centimeters and extends to the LEFT femoral head. There is increased  fragmentation of the LEFT acetabulum, suspicious for osteomyelitis. Bilateral chronic hip dysplasia. There is scarring in the region of the RIGHT ischium, consistent with remote or developing decubitus ulcer. IMPRESSION: 1. Interval development of distal LEFT ureteral calculus, measuring 2 millimeters but not associated with significant hydronephrosis, perinephric, or ureteral stranding. The significance of this finding is not known. 2. No evidence for bowel obstruction. 3. Enlarged LEFT decubitus ulcer is now contiguous with the LEFT hip and femoral head. Ulcer does not extend to the bowel. Fragmentation of the acetabulum is suspicious for osteomyelitis. Electronically Signed   By: ENolon NationsM.D.   On: 08/13/2020 17:49    Scheduled Meds:  baclofen  20 mg Per Tube QID   buPROPion  75 mg Per Tube TID   chlorhexidine  15 mL Mouth Rinse BID   Chlorhexidine Gluconate Cloth  6 each Topical Daily   dantrolene  50 mg Per J Tube QID   enoxaparin (LOVENOX) injection  40 mg Subcutaneous Q24H   famotidine  20 mg Per Tube Daily   feeding supplement (PROSource TF)  45 mL Per Tube TID   free water  150 mL Per Tube Q4H   insulin aspart  0-9 Units Subcutaneous Q4H   insulin detemir  10 Units Subcutaneous Daily   levETIRAcetam  1,200 mg Per Tube TID   mouth rinse  15 mL Mouth Rinse q12n4p   multivitamin with minerals  1 tablet Per Tube Daily   mupirocin ointment  1 application Nasal BID   nutrition supplement (JUVEN)  1 packet Per Tube BID BM   polyvinyl alcohol  1 drop Both Eyes BID   tiZANidine  2 mg Per Tube TID  Continuous Infusions:  sodium chloride 75 mL/hr at 08/13/20 1428   ampicillin-sulbactam (UNASYN) IV 3 g (08/14/20 0905)   feeding supplement (GLUCERNA 1.5 CAL) 50 mL/hr at 08/14/20 0415     LOS: 4 days    Time spent: 57mn  PDomenic Polite MD [facility]   08/14/2020, 11:03 AM

## 2020-08-14 NOTE — Progress Notes (Signed)
ID Brief Note  Blood cultures 8/5 both sets growing GPC  2 sets of blood cultures ordered today   CT abdomen/pelvis 08/14/20 findings noted  IMPRESSION: 1. Interval development of distal LEFT ureteral calculus, measuring 2 millimeters but not associated with significant hydronephrosis, perinephric, or ureteral stranding. The significance of this finding is not known. 2. No evidence for bowel obstruction. 3. Enlarged LEFT decubitus ulcer is now contiguous with the LEFT hip and femoral head. Ulcer does not extend to the bowel. Fragmentation of the acetabulum is suspicious for osteomyelitis.   Recommendations Continue Unasyn as is Monitor CBC and BMP Follow up blood cultures  Thought to be a poor candidate for TEE per Dr Storm Frisk note given MS  Dr Gale Journey will follow from tomorrow  Rosiland Oz, MD Infectious Disease Physician [facility] [address] [place], [place] [address] Phone: [phone]  Fax: [phone]

## 2020-08-14 NOTE — Progress Notes (Signed)
Daily Progress Note   Patient Name: Terry Palmer       Date: 08/14/2020 DOB: 03/22/1975  Age: 45 y.o. MRN#: [id number] Attending Physician: Domenic Polite, MD Primary Care Physician: Reymundo Poll, MD Admit Date: 08/10/2020  Reason for Consultation/Follow-up: goals of care  Subjective: Chart reviewed. Note that blood cultures showed polymicrobial bacteremia (MSSA, enterococcus faecalis and strep mitis), could be secondary to contaminated wound. ID is recommending minimum of 4 weeks antibiotics.   I went to see patient at bedside. He does not track or attempt to blink in response to questions (reportedly can do this at baseline).   I spoke with liaison from [facility] regarding her discussion with patient's wife [name] about potential hospice care at home. [name] seemed receptive to the hospice philosophy, but was concerned about the possibility of losing her current care givers (which are 24/7).   I reached out to [place] to inquire whether patient could have hospice care in addition to current 24/7 caregivers (paid for by Medicaid). Per CSW, it depends on whether the care givers provide custodial care or skilled care.   PMT will have ongoing discussions with CSW, hospice liaison, and wife [name] over the next few days to determine if the addition of hospice care is a viable and appropriate option for the patient  Length of Stay: 4   Palliative Care Assessment & Plan   HPI/Patient Profile: 45 y.o. 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 He presented to the emergency department on 08/10/2020 with fever and foul-smelling  urine. In the ED, he appeared to be septic with fever and tachycardia. UA appeared to be the source initially. Started on broad-spectrum IV antibiotics. He was admitted to [facility] with sepsis secondary to possible UTI.   Assessment: - severe sepsis - toxic encephalopathy - polymicrobial bacteremia - sacral decubitus wound - left ischial osteomyelitis - advanced multiple sclerosis - quadriplegia, non-verbal, dysphagia - neurogenic bladder with chronic suprapubic catheter  Recommendations/Plan: DNR/DNI as previously documented Continue current supportive care PMT to continue discussions about home hospice   Goals of Care and Additional Recommendations: Limitations on Scope of Treatment: Full Scope Treatment   Prognosis:  < 6 months  Discharge Planning: To Be Determined    Thank you for allowing the Palliative Medicine Team to assist in the care of this patient.   Total Time 25 minutes Prolonged Time Billed  no       Greater than 50%  of this time was spent counseling and coordinating care related to the above assessment and plan.  Lavena Bullion, NP  Please contact Palliative Medicine Team phone at [phone] for questions and concerns.

## 2020-08-15 LAB — GLUCOSE, CAPILLARY
Glucose-Capillary: 100 mg/dL — ABNORMAL HIGH (ref 70–99)
Glucose-Capillary: 112 mg/dL — ABNORMAL HIGH (ref 70–99)
Glucose-Capillary: 118 mg/dL — ABNORMAL HIGH (ref 70–99)
Glucose-Capillary: 127 mg/dL — ABNORMAL HIGH (ref 70–99)
Glucose-Capillary: 131 mg/dL — ABNORMAL HIGH (ref 70–99)
Glucose-Capillary: 164 mg/dL — ABNORMAL HIGH (ref 70–99)

## 2020-08-15 LAB — BASIC METABOLIC PANEL
Anion gap: 8 (ref 5–15)
BUN: 15 mg/dL (ref 6–20)
CO2: 25 mmol/L (ref 22–32)
Calcium: 8.2 mg/dL — ABNORMAL LOW (ref 8.9–10.3)
Chloride: 104 mmol/L (ref 98–111)
Creatinine, Ser: 0.33 mg/dL — ABNORMAL LOW (ref 0.61–1.24)
GFR, Estimated: >60 mL/min (ref >60)
Glucose, Bld: 123 mg/dL — ABNORMAL HIGH (ref 70–99)
Potassium: 4.3 mmol/L (ref 3.5–5.1)
Sodium: 137 mmol/L (ref 135–145)

## 2020-08-15 LAB — CBC
HCT: 30.2 % — ABNORMAL LOW (ref 39.0–52.0)
Hemoglobin: 9.5 g/dL — ABNORMAL LOW (ref 13.0–17.0)
MCH: 26 pg (ref 26.0–34.0)
MCHC: 31.5 g/dL (ref 30.0–36.0)
MCV: 82.7 fL (ref 80.0–100.0)
Platelets: 602 10*3/uL — ABNORMAL HIGH (ref 150–400)
RBC: 3.65 MIL/uL — ABNORMAL LOW (ref 4.22–5.81)
RDW: 16.1 % — ABNORMAL HIGH (ref 11.5–15.5)
WBC: 12.8 10*3/uL — ABNORMAL HIGH (ref 4.0–10.5)
nRBC: 0 % (ref 0.0–0.2)

## 2020-08-15 NOTE — Progress Notes (Signed)
PROGRESS NOTE    Terry Palmer  [id number] DOB: [birth year] DOA: 08/10/2020 PCP: Reymundo Poll, MD  Brief Narrative: 45 year old male with advanced MS, nonverbal, multiple contractures, quadriplegic, with suprapubic catheter, dysphagia with PEG tube, total care was brought to the ED by his family due to fevers, foul-smelling urine, increasing lethargy and somnolence. 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 -Appreciate ID input, repeat CT notes enlargement of sacral wound, now contiguous with left hip and femoral head, left acetabular osteomyelitis -Antibiotics switched to Unasyn 8/5 to cover polymicrobial bacteremia -Blood cultures from 8/3 and 8/5 are positive, repeat from 8/7 is pending -2D echo pending, he will be unable to have a TEE, -Palliative consulted as well and following, now DNR, had a long discussion with wife regarding consideration of hospice services, spouse unclear if he will continue to get 12 hours of nursing care daily if he enrolls in hospice  Sacral decubitus wounds, poa Left ischial osteomyelitis, acetabular osteomyelitis -Has unstageable ischial ulcer and a large sacral ulcer,  -Continue wound care, [facility] consult appreciated -See discussion above, regarding erosive changes involving left ischial tuberosity noted on CT abdomen pelvis in May 22 -Repeat CT 8/6: Enlarged LEFT decubitus ulcer is now contiguous with the LEFT hip and femoral head. Fragmentation of the acetabulum is suspicious for osteomyelitis -Antibiotics as above  Advanced multiple sclerosis Quadriplegia,  dysphagia Neurogenic bladder Nonverbal -Continue home regimen with muscle relaxers -Continue tube feeds -Palliative care consulted, continue DNR, continue conversations regarding hospice and need for comfort focused care -Suprapubic catheter was changed on 8/3 by home health nurse day of admission  Hyponatremia -Improved  Hypokalemia -Replaced  Type 2 diabetes mellitus -continue  tube feeds, continue low-dose Levemir  History of seizures -Continue Keppra  DVT prophylaxis: Lovenox Code Status: Now DNR Family Communication: No family at bedside, called and updated patient's spouse [name] yesterday Disposition Plan:  Status is: Inpatient  Remains inpatient appropriate because:Inpatient level of care appropriate due to severity of illness  Dispo: The patient is from: Home              Anticipated d/c is to: Home              Patient currently is not medically stable to d/c.   Difficult to place patient No   Consultants:  ID  Procedures:   Antimicrobials:    Subjective: -Heart rate remains elevated, afebrile today, otherwise unchanged, tolerating tube feeds  Objective: Vitals:   08/14/20 1943 08/14/20 2343 08/15/20 0349 08/15/20 0700  BP: (!) 146/93 118/73 126/84 131/89  Pulse: (!) 117 100 (!) 103   Resp: '20 15 15   '$ Temp: 99.3 F (37.4 C) 98.4 F (36.9 C) 98.6 F (37 C) 99.1 F (37.3 C)  TempSrc: Axillary Oral Axillary Oral  SpO2:      Weight:        Intake/Output Summary (Last 24 hours) at 08/15/2020 1100 Last data filed at 08/15/2020 0600 Gross per 24 hour  Intake --  Output 4250 ml  Net -4250 ml   Filed Weights   08/10/20 1600  Weight: 83.9 kg    Examination:  General exam: Chronically ill debilitated cachectic contracted male, nonverbal, laying in  bed HEENT: No JVD CVS: S1-S2, tachycardic Lungs: Few scattered rhonchi, decreased breath sounds the bases Abdomen: Soft, nontender, PEG tube and suprapubic catheter noted Extremities: Both lower  extremities are contracted  Skin: large Right ischial ulcer and large sacral decubitus wound    Data Reviewed:   CBC: Recent Labs  Lab 08/10/20 1742 08/11/20 0340 08/12/20 0331 08/13/20 0329 08/14/20 0156 08/15/20 0242  WBC 17.3* 17.7* 14.5* 19.0* 12.6* 12.8*  NEUTROABS 15.7* 14.8*  --   --   --   --   HGB 10.7* 9.0* 9.2* 8.9* 9.4* 9.5*  HCT 33.6* 28.4* 28.9* 27.9* 29.9* 30.2*  MCV 82.0 81.8 81.9 81.8 82.1 82.7  PLT 418* 370 378 407* 546* [id number]*   Basic Metabolic Panel: Recent Labs  Lab 08/10/20 1952 08/11/20 0340 08/12/20 0331 08/13/20 0329 08/14/20 0156 08/15/20 0242  NA 131* 134* 135 135 136 137  K 2.9* 2.9* 3.6 3.1* 4.0 4.3  CL 97* 102 103 102 103 104  CO2 22 21* '23 22 22 25  '$ GLUCOSE 144* 92 149* 157* 133* 123*  BUN '17 12 10 13 12 15  '$ CREATININE 0.37* 0.32* <0.30* 0.33* 0.31* 0.33*  CALCIUM 8.0* 8.0* 8.3* 8.1* 8.1* 8.2*  MG 1.8 2.1  --   --   --   --   PHOS 3.2 3.1  --   --   --   --    GFR: Estimated Creatinine Clearance: 136.9 mL/min (A) (by C-G formula based on SCr of 0.33 mg/dL (L)). Liver Function Tests: Recent Labs  Lab 08/10/20 1952 08/11/20 0340  AST 28 28  ALT 24 24  ALKPHOS 106 116  BILITOT 0.5 0.5  PROT 6.0* 5.9*  ALBUMIN 1.7* 1.6*   No results for input(s): LIPASE, AMYLASE in the last 168 hours. No results for input(s): AMMONIA in the last 168 hours. Coagulation Profile: Recent Labs  Lab 08/10/20 2018  INR 1.2   Cardiac Enzymes: No results for input(s): CKTOTAL, CKMB, CKMBINDEX, TROPONINI in the last 168 hours. BNP (last 3 results) No results for input(s): PROBNP in the last 8760 hours. HbA1C: No results for input(s): HGBA1C in the last 72 hours.  CBG: Recent Labs  Lab 08/14/20 1606 08/14/20 1927 08/14/20 2313 08/15/20 0357 08/15/20 0730  GLUCAP 139* 112* 117* 100* 127*   Lipid Profile: No results for input(s): CHOL, HDL, LDLCALC, TRIG, CHOLHDL, LDLDIRECT in the last 72 hours. Thyroid Function Tests: No results for  input(s): TSH, T4TOTAL, FREET4, T3FREE, THYROIDAB in the last 72 hours.  Anemia Panel: No results for input(s): VITAMINB12, FOLATE, FERRITIN, TIBC, IRON, RETICCTPCT in the last 72 hours.  Urine analysis:    Component Value Date/Time   COLORURINE AMBER (A) 08/10/2020 1635   APPEARANCEUR CLOUDY (A) 08/10/2020 1635   LABSPEC 1.024 08/10/2020 1635   PHURINE 7.0 08/10/2020 1635   GLUCOSEU NEGATIVE 08/10/2020 1635   HGBUR SMALL (A) 08/10/2020 1635   BILIRUBINUR NEGATIVE 08/10/2020 1635   KETONESUR NEGATIVE 08/10/2020 1635   PROTEINUR 30 (A) 08/10/2020 1635   UROBILINOGEN 0.2 10/20/2014 1328   NITRITE NEGATIVE 08/10/2020 1635   LEUKOCYTESUR LARGE (A) 08/10/2020 1635   Sepsis Labs: '@LABRCNTIP'$ (procalcitonin:4,lacticidven:4)  ) Recent Results (from the past 240 hour(s))  Urine Culture     Status: Abnormal   Collection Time: 08/10/20  3:52 PM   Specimen: In/Out Cath Urine  Result Value Ref Range Status   Specimen Description IN/OUT CATH URINE  Final   Special Requests   Final    NONE Performed at [facility]  [facility], [place] [address], [place], [place] [address]    Culture 70,000 COLONIES/mL PROTEUS MIRABILIS (A)  Final   Report Status 08/12/2020 FINAL  Final   Organism ID, Bacteria PROTEUS MIRABILIS (A)  Final      Susceptibility   Proteus mirabilis - MIC*    AMPICILLIN <=2 SENSITIVE Sensitive     CEFAZOLIN <=4 SENSITIVE Sensitive     CEFEPIME <=0.12 SENSITIVE Sensitive     CEFTRIAXONE <=0.25 SENSITIVE Sensitive     CIPROFLOXACIN >=4 RESISTANT Resistant     GENTAMICIN 8 INTERMEDIATE Intermediate     IMIPENEM 2 SENSITIVE Sensitive     NITROFURANTOIN 128 RESISTANT Resistant     TRIMETH/SULFA >=320 RESISTANT Resistant     AMPICILLIN/SULBACTAM <=2 SENSITIVE Sensitive     PIP/TAZO <=4 SENSITIVE Sensitive     * 70,000 COLONIES/mL PROTEUS MIRABILIS  Blood Culture (routine x 2)     Status: Abnormal   Collection Time: 08/10/20  3:52 PM   Specimen: BLOOD RIGHT ARM  Result Value Ref  Range Status   Specimen Description BLOOD RIGHT ARM  Final   Special Requests   Final    BOTTLES DRAWN AEROBIC AND ANAEROBIC Blood Culture adequate volume   Culture  Setup Time   Final    GRAM POSITIVE COCCI IN BOTH AEROBIC AND ANAEROBIC BOTTLES CRITICAL VALUE NOTED.  VALUE IS CONSISTENT WITH PREVIOUSLY REPORTED AND CALLED VALUE.    Culture (A)  Final    STAPHYLOCOCCUS AUREUS SUSCEPTIBILITIES PERFORMED ON PREVIOUS CULTURE WITHIN THE LAST 5 DAYS. ENTEROCOCCUS FAECALIS STREPTOCOCCUS MITIS/ORALIS THE SIGNIFICANCE OF ISOLATING THIS ORGANISM FROM A SINGLE SET OF BLOOD CULTURES WHEN MULTIPLE SETS ARE DRAWN IS UNCERTAIN. PLEASE NOTIFY THE MICROBIOLOGY DEPARTMENT WITHIN ONE WEEK IF SPECIATION AND SENSITIVITIES ARE REQUIRED. Performed at [facility], [place] [address] [place], [place] [address]    Report Status 08/14/2020 FINAL  Final   Organism ID, Bacteria ENTEROCOCCUS FAECALIS  Final      Susceptibility   Enterococcus faecalis - MIC*    AMPICILLIN <=2 SENSITIVE Sensitive     VANCOMYCIN 1 SENSITIVE Sensitive     GENTAMICIN SYNERGY RESISTANT Resistant     * ENTEROCOCCUS FAECALIS  Resp Panel by RT-PCR (Flu A&B, Covid) Nasopharyngeal Swab     Status: None   Collection Time: 08/10/20  3:53 PM   Specimen: Nasopharyngeal Swab; Nasopharyngeal(NP) swabs in vial transport medium  Result Value Ref Range Status   SARS Coronavirus 2 by RT PCR NEGATIVE NEGATIVE Final    Comment: (NOTE) SARS-CoV-2 target nucleic acids are NOT DETECTED.  The SARS-CoV-2 RNA is generally detectable in upper respiratory specimens during the acute phase of infection. The lowest concentration of SARS-CoV-2 viral copies this assay can detect is 138 copies/mL. A negative result does not preclude SARS-Cov-2 infection and should not be used as the sole basis for treatment or other patient management decisions. A negative result may occur with  improper specimen collection/handling, submission of specimen other than  nasopharyngeal swab, presence of viral mutation(s) within the areas targeted by this assay, and inadequate number of viral copies(<138 copies/mL). A negative result must be combined with clinical observations, patient history, and epidemiological information. The expected result is Negative.  Fact Sheet for Patients:  EntrepreneurPulse.com.au  Fact Sheet for Healthcare Providers:  IncredibleEmployment.be  This test is no t yet approved or cleared by the [place] FDA and  has been authorized for detection and/or diagnosis of SARS-CoV-2 by FDA under an Emergency Use Authorization (EUA). This EUA  will remain  in effect (meaning this test can be used) for the duration of the COVID-19 declaration under Section 564(b)(1) of the Act, 21 U.S.C.section 360bbb-3(b)(1), unless the authorization is terminated  or revoked sooner.       Influenza A by PCR NEGATIVE NEGATIVE Final   Influenza B by PCR NEGATIVE NEGATIVE Final    Comment: (NOTE) The Xpert Xpress SARS-CoV-2/FLU/RSV plus assay is intended as an aid in the diagnosis of influenza from Nasopharyngeal swab specimens and should not be used as a sole basis for treatment. Nasal washings and aspirates are unacceptable for Xpert Xpress SARS-CoV-2/FLU/RSV testing.  Fact Sheet for Patients: EntrepreneurPulse.com.au  Fact Sheet for Healthcare Providers: IncredibleEmployment.be  This test is not yet approved or cleared by the [place] FDA and has been authorized for detection and/or diagnosis of SARS-CoV-2 by FDA under an Emergency Use Authorization (EUA). This EUA will remain in effect (meaning this test can be used) for the duration of the COVID-19 declaration under Section 564(b)(1) of the Act, 21 U.S.C. section 360bbb-3(b)(1), unless the authorization is terminated or revoked.  Performed at [facility], [place] [address], [place], [place] [address]    Blood Culture (routine x 2)     Status: Abnormal   Collection Time: 08/10/20  3:57 PM   Specimen: BLOOD LEFT FOREARM  Result Value Ref Range Status   Specimen Description BLOOD LEFT FOREARM  Final   Special Requests   Final    BOTTLES DRAWN AEROBIC AND ANAEROBIC Blood Culture adequate volume   Culture  Setup Time   Final    GRAM POSITIVE COCCI IN BOTH AEROBIC AND ANAEROBIC BOTTLES Organism ID to follow CRITICAL RESULT CALLED TO, READ BACK BY AND VERIFIED WITH: A LAWLESS PHARMD 1358 08/11/20 A BROWNING Performed at [facility], [place] [address] [place], [place] [address]    Culture STAPHYLOCOCCUS AUREUS (A)  Final   Report Status 08/13/2020 FINAL  Final   Organism ID, Bacteria STAPHYLOCOCCUS AUREUS  Final      Susceptibility   Staphylococcus aureus - MIC*    CIPROFLOXACIN <=0.5 SENSITIVE Sensitive     ERYTHROMYCIN >=8 RESISTANT Resistant     GENTAMICIN <=0.5 SENSITIVE Sensitive     OXACILLIN <=0.25 SENSITIVE Sensitive     TETRACYCLINE <=1 SENSITIVE Sensitive     VANCOMYCIN <=0.5 SENSITIVE Sensitive     TRIMETH/SULFA <=10 SENSITIVE Sensitive     CLINDAMYCIN RESISTANT Resistant     RIFAMPIN <=0.5 SENSITIVE Sensitive     Inducible Clindamycin POSITIVE Resistant     * STAPHYLOCOCCUS AUREUS  Blood Culture ID Panel (Reflexed)     Status: Abnormal   Collection Time: 08/10/20  3:57 PM  Result Value Ref Range Status   Enterococcus faecalis NOT DETECTED NOT DETECTED Final   Enterococcus Faecium NOT DETECTED NOT DETECTED Final   Listeria monocytogenes NOT DETECTED NOT DETECTED Final   Staphylococcus species DETECTED (A) NOT DETECTED Final    Comment: CRITICAL RESULT CALLED TO, READ BACK BY AND VERIFIED WITH: A LAWLESS PHARMD 1358 08/11/20 A BROWNING    Staphylococcus aureus (BCID) DETECTED (A) NOT DETECTED Final    Comment: CRITICAL RESULT CALLED TO, READ BACK BY AND VERIFIED WITH: A LAWLESS PHARMD 1358 08/11/20 A BROWNING    Staphylococcus epidermidis NOT DETECTED NOT DETECTED  Final   Staphylococcus lugdunensis NOT DETECTED NOT DETECTED Final   Streptococcus species NOT DETECTED NOT DETECTED Final   Streptococcus agalactiae NOT DETECTED NOT DETECTED Final   Streptococcus pneumoniae NOT DETECTED NOT  DETECTED Final   Streptococcus pyogenes NOT DETECTED NOT DETECTED Final   A.calcoaceticus-baumannii NOT DETECTED NOT DETECTED Final   Bacteroides fragilis NOT DETECTED NOT DETECTED Final   Enterobacterales NOT DETECTED NOT DETECTED Final   Enterobacter cloacae complex NOT DETECTED NOT DETECTED Final   Escherichia coli NOT DETECTED NOT DETECTED Final   Klebsiella aerogenes NOT DETECTED NOT DETECTED Final   Klebsiella oxytoca NOT DETECTED NOT DETECTED Final   Klebsiella pneumoniae NOT DETECTED NOT DETECTED Final   Proteus species NOT DETECTED NOT DETECTED Final   Salmonella species NOT DETECTED NOT DETECTED Final   Serratia marcescens NOT DETECTED NOT DETECTED Final   Haemophilus influenzae NOT DETECTED NOT DETECTED Final   Neisseria meningitidis NOT DETECTED NOT DETECTED Final   Pseudomonas aeruginosa NOT DETECTED NOT DETECTED Final   Stenotrophomonas maltophilia NOT DETECTED NOT DETECTED Final   Candida albicans NOT DETECTED NOT DETECTED Final   Candida auris NOT DETECTED NOT DETECTED Final   Candida glabrata NOT DETECTED NOT DETECTED Final   Candida krusei NOT DETECTED NOT DETECTED Final   Candida parapsilosis NOT DETECTED NOT DETECTED Final   Candida tropicalis NOT DETECTED NOT DETECTED Final   Cryptococcus neoformans/gattii NOT DETECTED NOT DETECTED Final   Meth resistant mecA/C and MREJ NOT DETECTED NOT DETECTED Final    Comment: Performed at [facility], [address] [place], [place] [address]  Surgical PCR screen     Status: Abnormal   Collection Time: 08/11/20  3:54 PM   Specimen: Nasal Mucosa; Nasal Swab  Result Value Ref Range Status   MRSA, PCR NEGATIVE NEGATIVE Final   Staphylococcus aureus POSITIVE (A) NEGATIVE Final    Comment:  (NOTE) The Xpert SA Assay (FDA approved for NASAL specimens in patients 72 years of age and older), is one component of a comprehensive surveillance program. It is not intended to diagnose infection nor to guide or monitor treatment. Performed at [facility], [place] [address] [place], [place] [address]   Culture, blood (Routine X 2) w Reflex to ID Panel     Status: None (Preliminary result)   Collection Time: 08/12/20 11:33 AM   Specimen: BLOOD RIGHT HAND  Result Value Ref Range Status   Specimen Description BLOOD RIGHT HAND  Final   Special Requests   Final    BOTTLES DRAWN AEROBIC ONLY Blood Culture adequate volume   Culture  Setup Time   Final    GRAM POSITIVE COCCI IN CLUSTERS AEROBIC BOTTLE ONLY CRITICAL VALUE NOTED.  VALUE IS CONSISTENT WITH PREVIOUSLY REPORTED AND CALLED VALUE. Performed at [facility], [place] [address] [place], [place] [address]    Culture GRAM POSITIVE COCCI  Final   Report Status PENDING  Incomplete  Culture, blood (Routine X 2) w Reflex to ID Panel     Status: None (Preliminary result)   Collection Time: 08/12/20 11:35 AM   Specimen: BLOOD LEFT HAND  Result Value Ref Range Status   Specimen Description BLOOD LEFT HAND  Final   Special Requests   Final    BOTTLES DRAWN AEROBIC ONLY Blood Culture adequate volume   Culture  Setup Time   Final    GRAM POSITIVE COCCI IN CLUSTERS AEROBIC BOTTLE ONLY CRITICAL VALUE NOTED.  VALUE IS CONSISTENT WITH PREVIOUSLY REPORTED AND CALLED VALUE.    Culture   Final    GRAM POSITIVE COCCI CULTURE REINCUBATED FOR BETTER GROWTH Performed at [facility], [place] [address], [place], [place] [address]  Report Status PENDING  Incomplete         Radiology Studies: CT ABDOMEN PELVIS W CONTRAST  Result Date: 08/13/2020 CLINICAL DATA:  Sepsis. On acromial bacteremia. Evaluate for GI source. Chronic osteomyelitis from chronic sacral decubitus wounds. EXAM: CT ABDOMEN AND PELVIS WITH CONTRAST TECHNIQUE:  Multidetector CT imaging of the abdomen and pelvis was performed using the standard protocol following bolus administration of intravenous contrast. CONTRAST:  177m OMNIPAQUE IOHEXOL 350 MG/ML SOLN COMPARISON:  CT of the abdomen and pelvis on 05/18/2020 FINDINGS: Lower chest: Bibasilar atelectasis or scarring. The heart size is normal. Hepatobiliary: The liver is unremarkable. Layering gallstones are identified in the gallbladder which is otherwise normal in appearance. Pancreas: Unremarkable. No pancreatic ductal dilatation or surrounding inflammatory changes. Spleen: Normal in size without focal abnormality. Adrenals/Urinary Tract: Adrenal glands are normal. Kidneys are unremarkable. No hydronephrosis. Punctate 1-2 millimeter intrarenal calculi are identified in the UPPER pole of the RIGHT kidney. A 2 millimeter calculus is identified within the distal LEFT ureter, image 100 of series 3 and image 79 of series 6. A suprapubic catheter is identified within the urinary bladder which is otherwise decompressed. Stomach/Bowel: Stomach is normal in appearance. Gastrostomy tube is in good position. Small bowel loops are unremarkable. The appendix is well seen and has a normal appearance. Colon is unremarkable. No bowel wall thickening or evidence for bowel inflammation. Vascular/Lymphatic: There is atherosclerotic calcification of the abdominal aorta. No aneurysm. Small LEFT iliac lymph nodes are present, likely reactive. Otherwise, no significant adenopathy. Reproductive: Prostate is unremarkable. Other: Large LEFT sacral decubitus ulcer extends to LEFT hip and has increased in size. There is increased soft tissue density surrounding the LEFT hip. Large decubitus wound is now contiguous with the LEFT hip, measures 6.2 x 2.2 centimeters and extends to the LEFT femoral head. There is increased fragmentation of the LEFT acetabulum, suspicious for osteomyelitis. Bilateral chronic hip dysplasia. There is scarring in the  region of the RIGHT ischium, consistent with remote or developing decubitus ulcer. Musculoskeletal: Bilateral hip dysplasia. There is increased soft tissue density surrounding the LEFT hip. Large decubitus wound is now contiguous with the LEFT hip, measures 6.2 x 2.2 centimeters and extends to the LEFT femoral head. There is increased fragmentation of the LEFT acetabulum, suspicious for osteomyelitis. Bilateral chronic hip dysplasia. There is scarring in the region of the RIGHT ischium, consistent with remote or developing decubitus ulcer. IMPRESSION: 1. Interval development of distal LEFT ureteral calculus, measuring 2 millimeters but not associated with significant hydronephrosis, perinephric, or ureteral stranding. The significance of this finding is not known. 2. No evidence for bowel obstruction. 3. Enlarged LEFT decubitus ulcer is now contiguous with the LEFT hip and femoral head. Ulcer does not extend to the bowel. Fragmentation of the acetabulum is suspicious for osteomyelitis. Electronically Signed   By: ENolon NationsM.D.   On: 08/13/2020 17:49    Scheduled Meds:  baclofen  20 mg Per Tube QID   buPROPion  75 mg Per Tube TID   chlorhexidine  15 mL Mouth Rinse BID   Chlorhexidine Gluconate Cloth  6 each Topical Daily   dantrolene  50 mg Per J Tube QID   enoxaparin (LOVENOX) injection  40 mg Subcutaneous Q24H   famotidine  20 mg Per Tube Daily   feeding supplement (PROSource TF)  45 mL Per Tube TID   free water  150 mL Per Tube Q4H   insulin aspart  0-9 Units Subcutaneous Q4H   insulin detemir  10 Units  Subcutaneous Daily   levETIRAcetam  1,200 mg Per Tube TID   mouth rinse  15 mL Mouth Rinse q12n4p   multivitamin with minerals  1 tablet Per Tube Daily   mupirocin ointment  1 application Nasal BID   nutrition supplement (JUVEN)  1 packet Per Tube BID BM   polyvinyl alcohol  1 drop Both Eyes BID   tiZANidine  2 mg Per Tube TID   Continuous Infusions:  sodium chloride 75 mL/hr at  08/14/20 2143   ampicillin-sulbactam (UNASYN) IV 3 g (08/15/20 0923)   feeding supplement (GLUCERNA 1.5 CAL) 1,000 mL (08/14/20 1330)     LOS: 5 days    Time spent: 78mn  PDomenic Polite MD [facility]   08/15/2020, 11:00 AM

## 2020-08-15 NOTE — Care Management Important Message (Signed)
Important Message  Patient Details  Name: Terry Palmer MRN: [id number] Date of Birth: 03/02/1975   Medicare Important Message Given:  Yes     Orbie Pyo 08/15/2020, 4:36 PM

## 2020-08-16 ENCOUNTER — Inpatient Hospital Stay (HOSPITAL_COMMUNITY): Payer: Medicare HMO

## 2020-08-16 LAB — GLUCOSE, CAPILLARY
Glucose-Capillary: 123 mg/dL — ABNORMAL HIGH (ref 70–99)
Glucose-Capillary: 124 mg/dL — ABNORMAL HIGH (ref 70–99)
Glucose-Capillary: 124 mg/dL — ABNORMAL HIGH (ref 70–99)
Glucose-Capillary: 132 mg/dL — ABNORMAL HIGH (ref 70–99)
Glucose-Capillary: 140 mg/dL — ABNORMAL HIGH (ref 70–99)
Glucose-Capillary: 163 mg/dL — ABNORMAL HIGH (ref 70–99)

## 2020-08-16 LAB — BASIC METABOLIC PANEL
Anion gap: 10 (ref 5–15)
BUN: 14 mg/dL (ref 6–20)
CO2: 24 mmol/L (ref 22–32)
Calcium: 8.7 mg/dL — ABNORMAL LOW (ref 8.9–10.3)
Chloride: 100 mmol/L (ref 98–111)
Creatinine, Ser: 0.31 mg/dL — ABNORMAL LOW (ref 0.61–1.24)
GFR, Estimated: >60 mL/min (ref >60)
Glucose, Bld: 117 mg/dL — ABNORMAL HIGH (ref 70–99)
Potassium: 4.3 mmol/L (ref 3.5–5.1)
Sodium: 134 mmol/L — ABNORMAL LOW (ref 135–145)

## 2020-08-16 LAB — CULTURE, BLOOD (ROUTINE X 2)
Special Requests: ADEQUATE
Special Requests: ADEQUATE

## 2020-08-16 LAB — CBC
HCT: 33.7 % — ABNORMAL LOW (ref 39.0–52.0)
Hemoglobin: 10.5 g/dL — ABNORMAL LOW (ref 13.0–17.0)
MCH: 25.5 pg — ABNORMAL LOW (ref 26.0–34.0)
MCHC: 31.2 g/dL (ref 30.0–36.0)
MCV: 82 fL (ref 80.0–100.0)
Platelets: 750 10*3/uL — ABNORMAL HIGH (ref 150–400)
RBC: 4.11 MIL/uL — ABNORMAL LOW (ref 4.22–5.81)
RDW: 16.1 % — ABNORMAL HIGH (ref 11.5–15.5)
WBC: 14.8 10*3/uL — ABNORMAL HIGH (ref 4.0–10.5)
nRBC: 0.1 % (ref 0.0–0.2)

## 2020-08-16 LAB — MAGNESIUM: Magnesium: 2.3 mg/dL (ref 1.7–2.4)

## 2020-08-16 MED ORDER — CHLORHEXIDINE GLUCONATE CLOTH 2 % EX PADS
6.0000 | MEDICATED_PAD | Freq: Every day | CUTANEOUS | Status: DC
  Administered 2020-08-16: 6 via TOPICAL

## 2020-08-16 MED ORDER — IBUPROFEN 200 MG PO TABS
400.0000 mg | ORAL_TABLET | Freq: Four times a day (QID) | ORAL | Status: DC | PRN
  Administered 2020-08-16: 400 mg
  Filled 2020-08-16: qty 2

## 2020-08-16 MED ORDER — AMOXICILLIN-POT CLAVULANATE 250-62.5 MG/5ML PO SUSR: 500.0000 mg | Freq: Two times a day (BID) | ORAL | 0 refills | Status: AC

## 2020-08-16 MED ORDER — HYDROMORPHONE HCL 1 MG/ML IJ SOLN
0.5000 mg | Freq: Once | INTRAMUSCULAR | Status: AC
Start: 2020-08-16 — End: 2020-08-16
  Administered 2020-08-16: 0.5 mg via INTRAVENOUS
  Filled 2020-08-16: qty 1

## 2020-08-16 MED ORDER — LACTATED RINGERS IV BOLUS
250.0000 mL | Freq: Once | INTRAVENOUS | Status: AC
  Administered 2020-08-16: 250 mL via INTRAVENOUS

## 2020-08-16 MED ORDER — METOPROLOL TARTRATE 5 MG/5ML IV SOLN
2.5000 mg | Freq: Once | INTRAVENOUS | Status: AC
  Administered 2020-08-16: 2.5 mg via INTRAVENOUS
  Filled 2020-08-16: qty 5

## 2020-08-16 NOTE — Progress Notes (Signed)
Daily Progress Note   Patient Name: Terry Palmer       Date: 08/16/2020 DOB: 1975-06-26  Age: 45 y.o. MRN#: [id number] Attending Physician: Domenic Polite, MD Primary Care Physician: Reymundo Poll, MD Admit Date: 08/10/2020  Reason for Consultation/Follow-up: goals of care  Subjective: I spoke with wife Terry Palmer by phone to discuss plan of care. Reviewed the difference between outpatient palliative and Hospice care. Discussed that palliative care may be offered during any phase of a serious illness, while hospice care is usually offered when a person is expected to live for 6 months or less. In Terry Palmer's case, the main decision is regarding antibiotics.   Discussed that Infectious Disease had initially recommended 4 weeks of IV antibiotics. Per ID note 8/9 "If hospice decided, reasonable to given oral abx amox-clav if family desires, can plan for duration as above until 9/02".   Provided education that osteomyelitis is difficult to treat and likely to be recurrent because the wound is chronic at this point. Discussed that IV antibiotics would be "stronger" and more effective at treating the infection, but are not going to improve Terry Palmer's quality of life. Explained that under hospice care, he could receive oral antibiotics but not IV antibiotics. If she wants Terry Palmer to have IV antibiotics for 4 weeks, would need to do outpatient palliative and then transition to hospice when the IV antibiotics were complete. Terry Palmer verbalizes understanding and has decided on home hospice with oral antibiotics.   Additional time spent coordinating care and discussing plan of care with attending MD, [facility] team, and hospice liaison.    Length of Stay: 6     Vital Signs: BP (!) 143/91 (BP Location: Left Arm)   Pulse (!)  128   Temp 98.4 F (36.9 C)   Resp 16   Wt 83.9 kg   SpO2 100%   BMI 22.51 kg/m  SpO2: SpO2: 100 % O2 Device: O2 Device: Room Air O2 Flow Rate: O2 Flow Rate (L/min): 2 L/min   Palliative Assessment/Data: PPS 30% (with tube feeds)      Palliative Care Assessment & Plan   HPI/Patient Profile: 45 y.o. 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 He presented to the emergency  department on 08/10/2020 with fever and foul-smelling urine. In the ED, he appeared to be septic with fever and tachycardia. UA appeared to be the source initially. Started on broad-spectrum IV antibiotics. He was admitted to [facility] with sepsis secondary to possible UTI.   Assessment: - severe sepsis - toxic encephalopathy - polymicrobial bacteremia - sacral decubitus wound - left ischial osteomyelitis - advanced multiple sclerosis - quadriplegia, non-verbal, dysphagia - neurogenic bladder with chronic suprapubic catheter   Recommendations/Plan: Plan for discharge home with hospice ([facility])  Transition to oral antibiotics per ID recommendations (amoxicillin-clavulanate) until 09/09/20   Code Status: DNR/DNI  Prognosis:  < 6 months  Discharge Planning: Home with Hospice    Thank you for allowing the Palliative Medicine Team to assist in the care of this patient.   Total Time 35 minutes Prolonged Time Billed  no       Greater than 50%  of this time was spent counseling and coordinating care related to the above assessment and plan.  Lavena Bullion, NP  Please contact Palliative Medicine Team phone at [phone] for questions and concerns.

## 2020-08-16 NOTE — Progress Notes (Addendum)
Id briefnote    Polymicrobial bacteremia including strep mitis, mssa, e faecalis MS Decub ulcer  8/03 bcx positive 8/07 bcx negative   8/05 staph epi bacteremia likely contaminant  8/06 ct abd pelv 1. Interval development of distal LEFT ureteral calculus, measuring 2 millimeters but not associated with significant hydronephrosis, perinephric, or ureteral stranding. The significance of this finding is not known. 2. No evidence for bowel obstruction. 3. Enlarged LEFT decubitus ulcer is now contiguous with the LEFT hip and femoral head. Ulcer does not extend to the bowel. Fragmentation of the acetabulum is suspicious for osteomyelitis.    Abx: 8/05-c amp-sulbactam  8/03-04 vanc/cefepime/flagyl   -getting TTE; if this is negative, would treat 2 weeks iv mssa bacteremia and 2 weeks PO  -if tte negative, then plan is as below the 2 weeks treatment with amp-sulbactam (8/5-8/19)  on 8/19 would transition to linezolid 600 mg po bid to finish treatment course on 9/02  -will formalize recs once tte done (ordered)  ----------- Addendum If hospice decided, reasonable to given oral abx amox-clav if family desires, can plan for duration as above until 9/02

## 2020-08-16 NOTE — TOC Transition Note (Addendum)
Transition of Care [facility]) - CM/SW Discharge Note   Patient Details  Name: Terry Palmer MRN: [id number] Date of Birth: [birth year]  Transition of Care [facility]) CM/SW Contact:  Ella Bodo, RN Phone Number: 08/16/2020, 2:14 PM   Clinical Narrative:   Male with advanced multiple sclerosis, nonverbal, multiple contractures, quadriplegic admitted on 08/10/2020 due to fever, UTI and increased lethargy.  Patient found to have severe sepsis, toxic encephalopathy and bacteremia.  He has a chronic suprapubic catheter and is fed via PEG tube.  Patient resides at home with his wife, and requires total care; he has a caregiver (LPN) 12 hours daily, per wife, 8 AM to 8 PM.  Palliative care was consulted for goals of care; recommendation made for home hospice services, as long as he can continue with his home health caregiver.  Wife has elected [facility] [place] for hospice services, and plan is to discharge home today.  We will arrange ambulance transport via nonemergent ambulance transport. ([facility]).  Verified patient address with wife; MD notified of need for out of facility DNR.  Addendum: 1507pm [facility] notified for transport home.      Final next level of care: Home w Hospice Care Barriers to Discharge: Barriers Resolved   Patient Goals and CMS Choice   CMS Medicare.gov Compare Post Acute Care list provided to:: Patient Represenative (must comment) (wife) Choice offered to / list presented to : Spouse                        Discharge Plan and Services   Discharge Planning Services: CM Consult Post Acute Care Choice: Hospice                    HH Arranged: RN, Social Work, Nurse's Aide [place] Agency: [place] Date [place]: 08/16/20 Time [place]: 1410 Representative spoke with at [place]: [place] ([place]) Interventions     Readmission Risk Interventions No flowsheet data found.  Reinaldo Raddle,  RN, BSN  Trauma/Neuro ICU Case Manager [phone]

## 2020-08-16 NOTE — TOC Transition Note (Signed)
Transition of Care [facility]) - CM/SW Discharge Note   Patient Details  Name: Terry Palmer MRN: [id number] Date of Birth: 03/05/1975  Transition of Care [facility]) CM/SW Contact:  Ella Bodo, RN Phone Number: 08/16/2020, 3:42 PM   Clinical Narrative:   Male with advanced multiple sclerosis, nonverbal, multiple contractures, quadriplegic admitted on 08/10/2020 due to fever, UTI and increased lethargy.  Patient found to have severe sepsis, toxic encephalopathy and bacteremia.  He has a chronic suprapubic catheter and is fed via PEG tube.  Patient resides at home with his wife, and requires total care; he has a caregiver (LPN) 12 hours daily, per wife, 8 AM to 8 PM.    Palliative care was consulted for goals of care; recommendation made for home hospice services, as long as he can continue with his home health caregiver.  Wife has elected [facility] [place] for hospice services, and plan is to discharge home today.  Cheri with [facility] has confirmed that patient may have continued daily caregivers in addition to [employer].  We will arrange ambulance transport via nonemergent ambulance transport. ([facility]).  Verified patient address with wife; MD notified of need for out of facility DNR.  Addendum: 1507pm [facility] notified for transport home.      Final next level of care: Home w Hospice Care Barriers to Discharge: Barriers Resolved   Patient Goals and CMS Choice   CMS Medicare.gov Compare Post Acute Care list provided to:: Patient Represenative (must comment) (wife) Choice offered to / list presented to : Spouse                        Discharge Plan and Services   Discharge Planning Services: CM Consult Post Acute Care Choice: Hospice                    HH Arranged: RN, Social Work, Nurse's Aide [place] Agency: [place] Date [place]: 08/16/20 Time [place]: 1410 Representative spoke with at [place]: [place] ([place]) Interventions     Readmission Risk Interventions No flowsheet data found.  Reinaldo Raddle, RN, BSN  Trauma/Neuro ICU Case Manager [phone]

## 2020-08-16 NOTE — Discharge Summary (Signed)
Physician Discharge Summary  Terry Palmer SWN:[id number] DOB: 07-18-1975 DOA: 08/10/2020  PCP: Reymundo Poll, MD  Admit date: 08/10/2020 Discharge date: 08/16/2020  Time spent: 35 minutes  Recommendations for Outpatient Follow-up:  Discharge home with hospice services to focus on comfort and symptom focused care   Discharge Diagnoses:  Active Problems: Advanced multiple sclerosis ([place]) Quadriplegic Nonverbal Dysphagia with PEG tube Suprapubic catheter Sacral decubitus wounds Pelvic osteomyelitis   Sacral decubitus ulcer, unstageable   Seizure disorder (HCC)   Quadriplegia and quadriparesis (HCC)   Sepsis (HCC)   Suprapubic catheter ([place])   Acute metabolic encephalopathy   Hyponatremia   Hypokalemia   Discharge Condition: Poor  Diet recommendation: Tube feeds  Filed Weights   08/10/20 1600  Weight: 83.9 kg    History of present illness:   45 year old male with advanced MS, nonverbal, multiple contractures, quadriplegic, with suprapubic catheter, dysphagia with PEG tube, total care at home was brought to the ED by his family due to fevers, foul-smelling urine, increasing lethargy and somnolence. In the emergency room he was febrile tachycardic, tachypneic with lactic acidosis, and abnormal urinalysis -Blood cultures positive for MSSA, Enterococcus faecalis and Streptococcus mitis  Hospital Course:   Severe sepsis, poa Toxic encephalopathy Polymicrobial bacteremia including MSSA, Enterococcus faecalis and strep mitis -Could be secondary to contaminated wound, chronic left ischial osteomyelitis noted on CT in 5/22 -repeat CT notes enlargement of sacral wound, now contiguous with left hip and femoral head, left acetabular osteomyelitis -Seen by infectious disease in consultation, broad-spectrum antibiotics were switched to Unasyn 8/5 to cover polymicrobial bacteremia -Blood cultures from 8/3 and 8/5 are positive, repeat from 8/7 is negative -he will be unable to have a  TEE, -Palliative consulted, now DNR, had a long discussion with wife regarding consideration of hospice services, had family meeting with palliative team, now decision made for discharge home with hospice services, he will be switched to oral Augmentin at discharge. -Home hospice services set up  Sacral decubitus wounds, poa Left ischial osteomyelitis, acetabular osteomyelitis -Has unstageable ischial ulcer and a large sacral ulcer, -Continue wound care, [facility] consult appreciated -See discussion above, regarding erosive changes involving left ischial tuberosity noted on CT abdomen pelvis in May 22 -Repeat CT 8/6: Enlarged LEFT decubitus ulcer is now contiguous with the LEFT hip and femoral head. Fragmentation of the acetabulum is suspicious for osteomyelitis -Antibiotics as above   Advanced multiple sclerosis Quadriplegia, dysphagia Neurogenic bladder Nonverbal -Continue home regimen with muscle relaxers -Continue tube feeds -Palliative care consulted, now DNR, discussed very poor prognosis with wife, recommended consideration of hospice, family is accepting of this and he will be discharged home with hospice services -Suprapubic catheter was changed on 8/3 by home health nurse day of admission   Hyponatremia -Improved   Hypokalemia -Replaced   Type 2 diabetes mellitus -continue  tube feeds, continue Levemir   History of seizures -Continue Keppra  Consultations: Infectious disease Palliative medicine  Discharge Exam: Vitals:   08/16/20 0650 08/16/20 1127  BP: 138/83 (!) 143/91  Pulse: (!) 109 (!) 128  Resp: 16 16  Temp: 98.9 F (37.2 C) 98.4 F (36.9 C)  SpO2: 96% 100%   General exam: Chronically ill debilitated cachectic contracted male, nonverbal, laying in bed HEENT: No JVD CVS: S1-S2, tachycardic Lungs: Few scattered rhonchi, decreased breath sounds the bases Abdomen: Soft, nontender, PEG tube and suprapubic catheter noted Extremities: Both lower extremities  are contracted Skin: large Right ischial ulcer and large sacral decubitus wound     Discharge  Instructions   Discharge Instructions     Diet - low sodium heart healthy   Complete by: As directed    Discharge wound care:   Complete by: As directed    Cleanse wounds to sacrum and right ischium with NS and pat dry.  Apply Aquacel AG (LAWSON # [id number]) to wound bed. Fill in sacral wound with fluffed gauze. Cover with silicone foam.  Change every other day and PRN soilage.   Increase activity slowly   Complete by: As directed       Allergies as of 08/16/2020   No Known Allergies      Medication List     STOP taking these medications    omeprazole 2 mg/mL Susp oral suspension Commonly known as: FIRST-Omeprazole   Opurity B12/Folic Acid 7793-903 MCG Tabs       TAKE these medications    Accu-Chek Guide test strip Generic drug: glucose blood Use to check blood sugar up to four times daily   Accu-Chek Guide w/Device Kit 1 each by Does not apply route as needed.   Accu-Chek Softclix Lancets lancets use as directed to check blood sugar up to 4 times daily   ALPRAZolam 0.25 MG tablet Commonly known as: Xanax From 5/26 to 5/30 Give 0.21m per tube two times daily; then from 5/31 to 6/6 decrease to 0.222mper tube at bedtime. Then on 6/7 to 6/11, decrease to 0.2518mer tube at bedtime every OTHER day. Then STOP   amoxicillin-clavulanate 250-62.5 MG/5ML suspension Commonly known as: AUGMENTIN Take 10 mLs (500 mg total) by mouth 2 (two) times daily for 26 days.   AZO-CRANBERRY PO Take 1 tablet by mouth 2 (two) times daily.   baclofen 20 MG tablet Commonly known as: LIORESAL Place 1 tablet (20 mg total) into feeding tube 4 (four) times daily.   bisacodyl 10 MG suppository Commonly known as: DULCOLAX Place 10 mg rectally once as needed for moderate constipation.   buPROPion 75 MG tablet Commonly known as: WELLBUTRIN Place 1 tablet (75 mg total) into feeding tube 3  (three) times daily.   cloNIDine 0.1 MG tablet Commonly known as: CATAPRES Place 1 tablet (0.1 mg total) into feeding tube 3 (three) times daily.   dantrolene 50 MG capsule Commonly known as: DANTRIUM Give 50 mg by tube 4 (four) times daily. At 0800, 1200, 1600, 2000   diazepam 5 MG tablet Commonly known as: VALIUM Place 1 tablet (5 mg total) into feeding tube at bedtime as needed for anxiety (sleep). Hold until completion of Ativan   famotidine 40 MG/5ML suspension Commonly known as: PEPCID Take 20 mg by mouth daily.   feeding supplement (PROSource TF) liquid Place 45 mLs into feeding tube 3 (three) times daily. What changed: Another medication with the same name was removed. Continue taking this medication, and follow the directions you see here.   feeding supplement (GLUCERNA 1.5 CAL) Liqd Place 1,280 mLs into feeding tube daily. What changed: Another medication with the same name was removed. Continue taking this medication, and follow the directions you see here.   free water Soln Place 240 mLs into feeding tube every 4 (four) hours.   guaifenesin 100 MG/5ML syrup Commonly known as: ROBITUSSIN Take 200 mg by mouth 3 (three) times daily as needed for cough.   HYDROcodone-acetaminophen 7.5-325 MG tablet Commonly known as: NORChoptanktablet into feeding tube every 6 (six) hours as needed (pain).   insulin detemir 100 UNIT/ML injection Commonly known as: LEVEMIR Inject 0.22 mLs (  22 Units total) into the skin daily.   INSULIN SYRINGE .3CC/31GX5/16" 31G X 5/16" 0.3 ML Misc Use with Levemir prescription.   ipratropium-albuterol 0.5-2.5 (3) MG/3ML Soln Commonly known as: DUONEB Take 3 mLs by nebulization See admin instructions. Four times daily and as needed for shortness of breath   levETIRAcetam 100 MG/ML solution Commonly known as: KEPPRA Take 1,200 mg by mouth in the morning, at noon, and at bedtime. 12 ml   multivitamin with minerals Tabs tablet Place 1  tablet into feeding tube daily.   omega-3 acid ethyl esters 1 g capsule Commonly known as: LOVAZA 1 capsule daily. tube   vitamin C 500 MG tablet Commonly known as: ASCORBIC ACID Place 500 mg into feeding tube daily.       ASK your doctor about these medications    acetaminophen 500 MG tablet Commonly known as: TYLENOL Take 1 tablet (500 mg total) by mouth every 8 (eight) hours as needed for fever (or pain).   polyethylene glycol 17 g packet Commonly known as: MIRALAX / GLYCOLAX Place 17 g into feeding tube daily.   polyvinyl alcohol 1.4 % ophthalmic solution Commonly known as: LIQUIFILM TEARS Place 1 drop into both eyes 3 (three) times daily.   tiZANidine 2 MG tablet Commonly known as: ZANAFLEX Take 1 tablet (2 mg total) by mouth every 6 (six) hours as needed for muscle spasms.               Discharge Care Instructions  (From admission, onward)           Start     Ordered   08/16/20 0000  Discharge wound care:       Comments: Cleanse wounds to sacrum and right ischium with NS and pat dry.  Apply Aquacel AG (LAWSON # [id number]) to wound bed. Fill in sacral wound with fluffed gauze. Cover with silicone foam.  Change every other day and PRN soilage.   08/16/20 1358           No Known Allergies  Follow-up Information     Reymundo Poll, MD Follow up.   Specialty: Family Medicine Why: As needed Contact information: [address] [place] [id number] [phone]                  The results of significant diagnostics from this hospitalization (including imaging, microbiology, ancillary and laboratory) are listed below for reference.    Significant Diagnostic Studies: CT ABDOMEN PELVIS W CONTRAST  Result Date: 08/13/2020 CLINICAL DATA:  Sepsis. On acromial bacteremia. Evaluate for GI source. Chronic osteomyelitis from chronic sacral decubitus wounds. EXAM: CT ABDOMEN AND PELVIS WITH CONTRAST TECHNIQUE: Multidetector CT imaging of  the abdomen and pelvis was performed using the standard protocol following bolus administration of intravenous contrast. CONTRAST:  173m OMNIPAQUE IOHEXOL 350 MG/ML SOLN COMPARISON:  CT of the abdomen and pelvis on 05/18/2020 FINDINGS: Lower chest: Bibasilar atelectasis or scarring. The heart size is normal. Hepatobiliary: The liver is unremarkable. Layering gallstones are identified in the gallbladder which is otherwise normal in appearance. Pancreas: Unremarkable. No pancreatic ductal dilatation or surrounding inflammatory changes. Spleen: Normal in size without focal abnormality. Adrenals/Urinary Tract: Adrenal glands are normal. Kidneys are unremarkable. No hydronephrosis. Punctate 1-2 millimeter intrarenal calculi are identified in the UPPER pole of the RIGHT kidney. A 2 millimeter calculus is identified within the distal LEFT ureter, image 100 of series 3 and image 79 of series 6. A suprapubic catheter is identified within the urinary bladder  which is otherwise decompressed. Stomach/Bowel: Stomach is normal in appearance. Gastrostomy tube is in good position. Small bowel loops are unremarkable. The appendix is well seen and has a normal appearance. Colon is unremarkable. No bowel wall thickening or evidence for bowel inflammation. Vascular/Lymphatic: There is atherosclerotic calcification of the abdominal aorta. No aneurysm. Small LEFT iliac lymph nodes are present, likely reactive. Otherwise, no significant adenopathy. Reproductive: Prostate is unremarkable. Other: Large LEFT sacral decubitus ulcer extends to LEFT hip and has increased in size. There is increased soft tissue density surrounding the LEFT hip. Large decubitus wound is now contiguous with the LEFT hip, measures 6.2 x 2.2 centimeters and extends to the LEFT femoral head. There is increased fragmentation of the LEFT acetabulum, suspicious for osteomyelitis. Bilateral chronic hip dysplasia. There is scarring in the region of the RIGHT ischium,  consistent with remote or developing decubitus ulcer. Musculoskeletal: Bilateral hip dysplasia. There is increased soft tissue density surrounding the LEFT hip. Large decubitus wound is now contiguous with the LEFT hip, measures 6.2 x 2.2 centimeters and extends to the LEFT femoral head. There is increased fragmentation of the LEFT acetabulum, suspicious for osteomyelitis. Bilateral chronic hip dysplasia. There is scarring in the region of the RIGHT ischium, consistent with remote or developing decubitus ulcer. IMPRESSION: 1. Interval development of distal LEFT ureteral calculus, measuring 2 millimeters but not associated with significant hydronephrosis, perinephric, or ureteral stranding. The significance of this finding is not known. 2. No evidence for bowel obstruction. 3. Enlarged LEFT decubitus ulcer is now contiguous with the LEFT hip and femoral head. Ulcer does not extend to the bowel. Fragmentation of the acetabulum is suspicious for osteomyelitis. Electronically Signed   By: Nolon Nations M.D.   On: 08/13/2020 17:49   DG Chest Port 1 View  Result Date: 08/10/2020 CLINICAL DATA:  Questionable sepsis. EXAM: PORTABLE CHEST 1 VIEW.  Patient is rotated. COMPARISON:  Chest x-ray 07/16/2020, chest x-ray 10/28/2012 FINDINGS: The heart size and mediastinal contours are unchanged. Persistent elevation of left hemidiaphragm. Low lung volumes. Left base linear atelectasis versus scarring. No focal consolidation. No pulmonary edema. No pleural effusion. No pneumothorax. No acute osseous abnormality. IMPRESSION: No active disease. Electronically Signed   By: Iven Finn M.D.   On: 08/10/2020 17:06    Microbiology: Recent Results (from the past 240 hour(s))  Urine Culture     Status: Abnormal   Collection Time: 08/10/20  3:52 PM   Specimen: In/Out Cath Urine  Result Value Ref Range Status   Specimen Description IN/OUT CATH URINE  Final   Special Requests   Final    NONE Performed at [facility], [address] [place], [place] [address]    Culture 70,000 COLONIES/mL PROTEUS MIRABILIS (A)  Final   Report Status 08/12/2020 FINAL  Final   Organism ID, Bacteria PROTEUS MIRABILIS (A)  Final      Susceptibility   Proteus mirabilis - MIC*    AMPICILLIN <=2 SENSITIVE Sensitive     CEFAZOLIN <=4 SENSITIVE Sensitive     CEFEPIME <=0.12 SENSITIVE Sensitive     CEFTRIAXONE <=0.25 SENSITIVE Sensitive     CIPROFLOXACIN >=4 RESISTANT Resistant     GENTAMICIN 8 INTERMEDIATE Intermediate     IMIPENEM 2 SENSITIVE Sensitive     NITROFURANTOIN 128 RESISTANT Resistant     TRIMETH/SULFA >=320 RESISTANT Resistant     AMPICILLIN/SULBACTAM <=2 SENSITIVE Sensitive     PIP/TAZO <=4 SENSITIVE Sensitive     * 70,000 COLONIES/mL PROTEUS MIRABILIS  Blood Culture (routine x 2)     Status: Abnormal   Collection Time: 08/10/20  3:52 PM   Specimen: BLOOD RIGHT ARM  Result Value Ref Range Status   Specimen Description BLOOD RIGHT ARM  Final   Special Requests   Final    BOTTLES DRAWN AEROBIC AND ANAEROBIC Blood Culture adequate volume   Culture  Setup Time   Final    GRAM POSITIVE COCCI IN BOTH AEROBIC AND ANAEROBIC BOTTLES CRITICAL VALUE NOTED.  VALUE IS CONSISTENT WITH PREVIOUSLY REPORTED AND CALLED VALUE.    Culture (A)  Final    STAPHYLOCOCCUS AUREUS SUSCEPTIBILITIES PERFORMED ON PREVIOUS CULTURE WITHIN THE LAST 5 DAYS. ENTEROCOCCUS FAECALIS STREPTOCOCCUS MITIS/ORALIS THE SIGNIFICANCE OF ISOLATING THIS ORGANISM FROM A SINGLE SET OF BLOOD CULTURES WHEN MULTIPLE SETS ARE DRAWN IS UNCERTAIN. PLEASE NOTIFY THE MICROBIOLOGY DEPARTMENT WITHIN ONE WEEK IF SPECIATION AND SENSITIVITIES ARE REQUIRED. Performed at [facility], [place] [address], [place], [place] [address]    Report Status 08/14/2020 FINAL  Final   Organism ID, Bacteria ENTEROCOCCUS FAECALIS  Final      Susceptibility   Enterococcus faecalis - MIC*    AMPICILLIN <=2 SENSITIVE Sensitive     VANCOMYCIN 1 SENSITIVE Sensitive      GENTAMICIN SYNERGY RESISTANT Resistant     * ENTEROCOCCUS FAECALIS  Resp Panel by RT-PCR (Flu A&B, Covid) Nasopharyngeal Swab     Status: None   Collection Time: 08/10/20  3:53 PM   Specimen: Nasopharyngeal Swab; Nasopharyngeal(NP) swabs in vial transport medium  Result Value Ref Range Status   SARS Coronavirus 2 by RT PCR NEGATIVE NEGATIVE Final    Comment: (NOTE) SARS-CoV-2 target nucleic acids are NOT DETECTED.  The SARS-CoV-2 RNA is generally detectable in upper respiratory specimens during the acute phase of infection. The lowest concentration of SARS-CoV-2 viral copies this assay can detect is 138 copies/mL. A negative result does not preclude SARS-Cov-2 infection and should not be used as the sole basis for treatment or other patient management decisions. A negative result may occur with  improper specimen collection/handling, submission of specimen other than nasopharyngeal swab, presence of viral mutation(s) within the areas targeted by this assay, and inadequate number of viral copies(<138 copies/mL). A negative result must be combined with clinical observations, patient history, and epidemiological information. The expected result is Negative.  Fact Sheet for Patients:  EntrepreneurPulse.com.au  Fact Sheet for Healthcare Providers:  IncredibleEmployment.be  This test is no t yet approved or cleared by the [place] FDA and  has been authorized for detection and/or diagnosis of SARS-CoV-2 by FDA under an Emergency Use Authorization (EUA). This EUA will remain  in effect (meaning this test can be used) for the duration of the COVID-19 declaration under Section 564(b)(1) of the Act, 21 U.S.C.section 360bbb-3(b)(1), unless the authorization is terminated  or revoked sooner.       Influenza A by PCR NEGATIVE NEGATIVE Final   Influenza B by PCR NEGATIVE NEGATIVE Final    Comment: (NOTE) The Xpert Xpress SARS-CoV-2/FLU/RSV plus  assay is intended as an aid in the diagnosis of influenza from Nasopharyngeal swab specimens and should not be used as a sole basis for treatment. Nasal washings and aspirates are unacceptable for Xpert Xpress SARS-CoV-2/FLU/RSV testing.  Fact Sheet for Patients: EntrepreneurPulse.com.au  Fact Sheet for Healthcare Providers: IncredibleEmployment.be  This test is not yet approved or cleared by the [place] FDA and has been authorized for detection and/or diagnosis of SARS-CoV-2 by FDA under an Emergency Use  Authorization (EUA). This EUA will remain in effect (meaning this test can be used) for the duration of the COVID-19 declaration under Section 564(b)(1) of the Act, 21 U.S.C. section 360bbb-3(b)(1), unless the authorization is terminated or revoked.  Performed at [facility], [place] [address] [place], [place] [address]   Blood Culture (routine x 2)     Status: Abnormal   Collection Time: 08/10/20  3:57 PM   Specimen: BLOOD LEFT FOREARM  Result Value Ref Range Status   Specimen Description BLOOD LEFT FOREARM  Final   Special Requests   Final    BOTTLES DRAWN AEROBIC AND ANAEROBIC Blood Culture adequate volume   Culture  Setup Time   Final    GRAM POSITIVE COCCI IN BOTH AEROBIC AND ANAEROBIC BOTTLES Organism ID to follow CRITICAL RESULT CALLED TO, READ BACK BY AND VERIFIED WITH: A LAWLESS PHARMD 1358 08/11/20 A BROWNING Performed at [facility], [place] [address] [place],  [address]    Culture STAPHYLOCOCCUS AUREUS (A)  Final   Report Status 08/13/2020 FINAL  Final   Organism ID, Bacteria STAPHYLOCOCCUS AUREUS  Final      Susceptibility   Staphylococcus aureus - MIC*    CIPROFLOXACIN <=0.5 SENSITIVE Sensitive     ERYTHROMYCIN >=8 RESISTANT Resistant     GENTAMICIN <=0.5 SENSITIVE Sensitive     OXACILLIN <=0.25 SENSITIVE Sensitive     TETRACYCLINE <=1 SENSITIVE Sensitive     VANCOMYCIN <=0.5 SENSITIVE Sensitive      TRIMETH/SULFA <=10 SENSITIVE Sensitive     CLINDAMYCIN RESISTANT Resistant     RIFAMPIN <=0.5 SENSITIVE Sensitive     Inducible Clindamycin POSITIVE Resistant     * STAPHYLOCOCCUS AUREUS  Blood Culture ID Panel (Reflexed)     Status: Abnormal   Collection Time: 08/10/20  3:57 PM  Result Value Ref Range Status   Enterococcus faecalis NOT DETECTED NOT DETECTED Final   Enterococcus Faecium NOT DETECTED NOT DETECTED Final   Listeria monocytogenes NOT DETECTED NOT DETECTED Final   Staphylococcus species DETECTED (A) NOT DETECTED Final    Comment: CRITICAL RESULT CALLED TO, READ BACK BY AND VERIFIED WITH: A LAWLESS PHARMD 1358 08/11/20 A BROWNING    Staphylococcus aureus (BCID) DETECTED (A) NOT DETECTED Final    Comment: CRITICAL RESULT CALLED TO, READ BACK BY AND VERIFIED WITH: A LAWLESS PHARMD 1358 08/11/20 A BROWNING    Staphylococcus epidermidis NOT DETECTED NOT DETECTED Final   Staphylococcus lugdunensis NOT DETECTED NOT DETECTED Final   Streptococcus species NOT DETECTED NOT DETECTED Final   Streptococcus agalactiae NOT DETECTED NOT DETECTED Final   Streptococcus pneumoniae NOT DETECTED NOT DETECTED Final   Streptococcus pyogenes NOT DETECTED NOT DETECTED Final   A.calcoaceticus-baumannii NOT DETECTED NOT DETECTED Final   Bacteroides fragilis NOT DETECTED NOT DETECTED Final   Enterobacterales NOT DETECTED NOT DETECTED Final   Enterobacter cloacae complex NOT DETECTED NOT DETECTED Final   Escherichia coli NOT DETECTED NOT DETECTED Final   Klebsiella aerogenes NOT DETECTED NOT DETECTED Final   Klebsiella oxytoca NOT DETECTED NOT DETECTED Final   Klebsiella pneumoniae NOT DETECTED NOT DETECTED Final   Proteus species NOT DETECTED NOT DETECTED Final   Salmonella species NOT DETECTED NOT DETECTED Final   Serratia marcescens NOT DETECTED NOT DETECTED Final   Haemophilus influenzae NOT DETECTED NOT DETECTED Final   Neisseria meningitidis NOT DETECTED NOT DETECTED Final   Pseudomonas  aeruginosa NOT DETECTED NOT DETECTED Final   Stenotrophomonas maltophilia NOT DETECTED NOT DETECTED Final   Candida  albicans NOT DETECTED NOT DETECTED Final   Candida auris NOT DETECTED NOT DETECTED Final   Candida glabrata NOT DETECTED NOT DETECTED Final   Candida krusei NOT DETECTED NOT DETECTED Final   Candida parapsilosis NOT DETECTED NOT DETECTED Final   Candida tropicalis NOT DETECTED NOT DETECTED Final   Cryptococcus neoformans/gattii NOT DETECTED NOT DETECTED Final   Meth resistant mecA/C and MREJ NOT DETECTED NOT DETECTED Final    Comment: Performed at [facility], [place] [address], [place], [place] [address]  Surgical PCR screen     Status: Abnormal   Collection Time: 08/11/20  3:54 PM   Specimen: Nasal Mucosa; Nasal Swab  Result Value Ref Range Status   MRSA, PCR NEGATIVE NEGATIVE Final   Staphylococcus aureus POSITIVE (A) NEGATIVE Final    Comment: (NOTE) The Xpert SA Assay (FDA approved for NASAL specimens in patients 89 years of age and older), is one component of a comprehensive surveillance program. It is not intended to diagnose infection nor to guide or monitor treatment. Performed at [facility], [place] [address] [place], [place] [address]   Culture, blood (Routine X 2) w Reflex to ID Panel     Status: Abnormal   Collection Time: 08/12/20 11:33 AM   Specimen: BLOOD RIGHT HAND  Result Value Ref Range Status   Specimen Description BLOOD RIGHT HAND  Final   Special Requests   Final    BOTTLES DRAWN AEROBIC ONLY Blood Culture adequate volume   Culture  Setup Time   Final    GRAM POSITIVE COCCI IN CLUSTERS AEROBIC BOTTLE ONLY CRITICAL VALUE NOTED.  VALUE IS CONSISTENT WITH PREVIOUSLY REPORTED AND CALLED VALUE.    Culture (A)  Final    STAPHYLOCOCCUS EPIDERMIDIS SUSCEPTIBILITIES PERFORMED ON PREVIOUS CULTURE WITHIN THE LAST 5 DAYS. Performed at [facility], [place] [address] [place], [place] [address]    Report Status 08/16/2020 FINAL  Final   Culture, blood (Routine X 2) w Reflex to ID Panel     Status: Abnormal   Collection Time: 08/12/20 11:35 AM   Specimen: BLOOD LEFT HAND  Result Value Ref Range Status   Specimen Description BLOOD LEFT HAND  Final   Special Requests   Final    BOTTLES DRAWN AEROBIC ONLY Blood Culture adequate volume   Culture  Setup Time   Final    GRAM POSITIVE COCCI IN CLUSTERS AEROBIC BOTTLE ONLY CRITICAL VALUE NOTED.  VALUE IS CONSISTENT WITH PREVIOUSLY REPORTED AND CALLED VALUE. Performed at [facility], [place] [address] [place], [place] [address]    Culture STAPHYLOCOCCUS EPIDERMIDIS (A)  Final   Report Status 08/16/2020 FINAL  Final   Organism ID, Bacteria STAPHYLOCOCCUS EPIDERMIDIS  Final      Susceptibility   Staphylococcus epidermidis - MIC*    CIPROFLOXACIN >=8 RESISTANT Resistant     ERYTHROMYCIN >=8 RESISTANT Resistant     GENTAMICIN <=0.5 SENSITIVE Sensitive     OXACILLIN >=4 RESISTANT Resistant     TETRACYCLINE >=16 RESISTANT Resistant     VANCOMYCIN 1 SENSITIVE Sensitive     TRIMETH/SULFA 80 RESISTANT Resistant     CLINDAMYCIN >=8 RESISTANT Resistant     RIFAMPIN <=0.5 SENSITIVE Sensitive     Inducible Clindamycin NEGATIVE Sensitive     * STAPHYLOCOCCUS EPIDERMIDIS  Culture, blood (routine x 2)     Status: None (Preliminary result)   Collection Time: 08/14/20  8:07 AM   Specimen: BLOOD  Result Value Ref Range Status  Specimen Description BLOOD SITE NOT SPECIFIED  Final   Special Requests   Final    BOTTLES DRAWN AEROBIC ONLY Blood Culture results may not be optimal due to an inadequate volume of blood received in culture bottles   Culture   Final    NO GROWTH 2 DAYS Performed at [facility], [place] [address], [place], [place] [address]    Report Status PENDING  Incomplete  Culture, blood (routine x 2)     Status: None (Preliminary result)   Collection Time: 08/14/20  8:07 AM   Specimen: BLOOD  Result Value Ref Range Status   Specimen Description BLOOD SITE NOT  SPECIFIED  Final   Special Requests   Final    BOTTLES DRAWN AEROBIC ONLY Blood Culture results may not be optimal due to an inadequate volume of blood received in culture bottles   Culture   Final    NO GROWTH 2 DAYS Performed at [facility], [place] [address], [place], [place] [address]    Report Status PENDING  Incomplete     Labs: Basic Metabolic Panel: Recent Labs  Lab 08/10/20 1952 08/11/20 0340 08/12/20 0331 08/13/20 0329 08/14/20 0156 08/15/20 0242 08/16/20 0322  NA 131* 134* 135 135 136 137 134*  K 2.9* 2.9* 3.6 3.1* 4.0 4.3 4.3  CL 97* 102 103 102 103 104 100  CO2 22 21* 23 22 22 25 24   GLUCOSE 144* 92 149* 157* 133* 123* 117*  BUN 17 12 10 13 12 15 14   CREATININE 0.37* 0.32* <0.30* 0.33* 0.31* 0.33* 0.31*  CALCIUM 8.0* 8.0* 8.3* 8.1* 8.1* 8.2* 8.7*  MG 1.8 2.1  --   --   --   --  2.3  PHOS 3.2 3.1  --   --   --   --   --    Liver Function Tests: Recent Labs  Lab 08/10/20 1952 08/11/20 0340  AST 28 28  ALT 24 24  ALKPHOS 106 116  BILITOT 0.5 0.5  PROT 6.0* 5.9*  ALBUMIN 1.7* 1.6*   No results for input(s): LIPASE, AMYLASE in the last 168 hours. No results for input(s): AMMONIA in the last 168 hours. CBC: Recent Labs  Lab 08/10/20 1742 08/11/20 0340 08/12/20 0331 08/13/20 0329 08/14/20 0156 08/15/20 0242 08/16/20 0322  WBC 17.3* 17.7* 14.5* 19.0* 12.6* 12.8* 14.8*  NEUTROABS 15.7* 14.8*  --   --   --   --   --   HGB 10.7* 9.0* 9.2* 8.9* 9.4* 9.5* 10.5*  HCT 33.6* 28.4* 28.9* 27.9* 29.9* 30.2* 33.7*  MCV 82.0 81.8 81.9 81.8 82.1 82.7 82.0  PLT 418* 370 378 407* 546* 602* 750*   Cardiac Enzymes: No results for input(s): CKTOTAL, CKMB, CKMBINDEX, TROPONINI in the last 168 hours. BNP: BNP (last 3 results) No results for input(s): BNP in the last 8760 hours.  ProBNP (last 3 results) No results for input(s): PROBNP in the last 8760 hours.  CBG: Recent Labs  Lab 08/15/20 1935 08/15/20 2340 08/16/20 0357 08/16/20 0805 08/16/20 1129   GLUCAP 112* 131* 123* 140* 163*       Signed:  Domenic Polite MD.  [facility] 08/16/2020, 2:00 PM

## 2020-08-16 NOTE — Progress Notes (Addendum)
   I have reached out to the pt wife [name] to set up a good time to meet and discuss options for her and her husband. I would like to discuss stopping all treatments and moving to inpatient facility for end of life care, Possibly going home with hospice and stopping IV antibiotics or our [facility] where she could continue IV antibiotics and take pt home with additional support. I have left a VM for her to return my call when she can.   TC from [place] pt's wife. We discussed the options mentioned above with her. Answered many questions and offered out assistance to help with d/c plan. She wants to discuss with the pt mother the option between Palliative Care and hospice care at home. She states that she will call us back to update [place]..   200pm TC from [place] who states MD wants to d/c pt today and family has chosen hospice care at home.  We will follow at home once d/c is completed. Pt will need to go home by ambulance.  Webb Silversmith RN [phone]

## 2020-08-16 NOTE — Progress Notes (Signed)
   08/16/20 0350  Assess: MEWS Score  Temp 100.3 F (37.9 C)  BP (!) 150/99  Pulse Rate (!) 138  ECG Heart Rate (!) 138  Resp (!) 21  Level of Consciousness Alert  SpO2 99 %  O2 Device Room Air  Assess: MEWS Score  MEWS Temp 0  MEWS Systolic 0  MEWS Pulse 3  MEWS RR 1  MEWS LOC 0  MEWS Score 4  MEWS Score Color Red  Assess: if the MEWS score is Yellow or Red  Were vital signs taken at a resting state? Yes  Focused Assessment Change from prior assessment (see assessment flowsheet)  Early Detection of Sepsis Score *See Row Information* High  MEWS guidelines implemented *See Row Information* Yes  Treat  MEWS Interventions Administered scheduled meds/treatments;Administered prn meds/treatments;Escalated (See documentation below)  Pain Scale Faces  Faces Pain Scale 6  Pain Type Chronic pain  Pain Location Generalized  Take Vital Signs  Increase Vital Sign Frequency  Red: Q 1hr X 4 then Q 4hr X 4, if remains red, continue Q 4hrs  Escalate  MEWS: Escalate Red: discuss with charge nurse/RN and provider, consider discussing with RRT  Notify: Charge Nurse/RN  Name of Charge Nurse/RN Notified Gladys  Date Charge Nurse/RN Notified 08/16/20  Time Charge Nurse/RN Notified [id number]  Notify: Provider  Provider Name/Title Gershon Cull NP  Date Provider Notified 08/16/20  Time Provider Notified [phone]  Notification Type Page  Notification Reason Change in status  Provider response Evaluate remotely;See new orders  Date of Provider Response 08/16/20  Time of Provider Response 0350   Pt stable and mews green after interventions. Will continue to closely monitor pt and report off to oncoming RN. Delia Heady RN

## 2020-08-17 NOTE — Progress Notes (Signed)
Pt discharge education completed. Pt IV and telemetry removed. Pt picked up by [facility] to be transported off to disposition. Pt sacral wounds dressings changed prior to discharge. Pt urine drainage bag emptied prior to transported off unit, abd PEG flushed and clamped. Pt transported off unit via stretcher with belongings to the side and AVS printed off and handed to [place]. Delia Heady RN

## 2020-08-19 LAB — CULTURE, BLOOD (ROUTINE X 2)
Culture: NO GROWTH
Culture: NO GROWTH

## 2021-06-06 ENCOUNTER — Telehealth: Payer: Self-pay

## 2021-06-06 NOTE — Telephone Encounter (Signed)
Spoke with patient's spouse Terry Palmer and scheduled a Mychart Palliative Consult for 06/12/21 @ 1:30 PM.   Consent obtained; updated Netsmart, Team List and Epic.

## 2021-06-06 NOTE — Telephone Encounter (Signed)
Attempted to contact patient's spouse [name] to schedule a Palliative Care consult appointment. No answer left a message to return call.

## 2021-06-12 ENCOUNTER — Encounter: Payer: Self-pay | Admitting: Internal Medicine

## 2021-06-12 ENCOUNTER — Telehealth: Payer: Medicaid Other | Admitting: Internal Medicine

## 2021-06-12 VITALS — Temp 99.5°F

## 2021-06-12 DIAGNOSIS — J189 Pneumonia, unspecified organism: Secondary | ICD-10-CM

## 2021-06-12 DIAGNOSIS — G35 Multiple sclerosis: Secondary | ICD-10-CM

## 2021-06-12 DIAGNOSIS — Z515 Encounter for palliative care: Secondary | ICD-10-CM

## 2021-06-12 MED ORDER — AMOXICILLIN-POT CLAVULANATE 875-125 MG PO TABS: 1.0000 | ORAL_TABLET | Freq: Two times a day (BID) | ORAL | 0 refills | Status: DC

## 2021-06-12 MED ORDER — BACLOFEN 20 MG PO TABS: 20.0000 mg | ORAL_TABLET | Freq: Four times a day (QID) | ORAL | 5 refills | Status: AC

## 2021-06-12 MED ORDER — IPRATROPIUM-ALBUTEROL 0.5-2.5 (3) MG/3ML IN SOLN: 3.0000 mL | RESPIRATORY_TRACT | 5 refills | Status: AC

## 2021-06-12 NOTE — Progress Notes (Signed)
[facility] Consult Note Telephone: [phone]  Fax: [phone]   Date of encounter: 06/12/21 9:23 AM PATIENT NAME: Terry Palmer [place] [address]   [id number] (home)  DOB: 01-28-1975 MRN: [id number] PRIMARY CARE PROVIDER:    Dr. Alvester Chou  REFERRING PROVIDER:   Dr. Alvester Chou  RESPONSIBLE PARTY:    Contact Information     Name Relation Home Work Mobile   [place] Spouse [phone]  [phone]   Ramsay,[name] Mother [phone]  [phone]   [place], Shannahan Father   [phone]        Due to the COVID-19 crisis, this visit was done via telemedicine from my office and it was initiated and consent by this patient and or family.  I connected with  Alvera Singh OR PROXY on 06/12/21 by a video enabled telemedicine application and verified that I am speaking with the correct person using two identifiers.   I discussed the limitations of evaluation and management by telemedicine. The patient expressed understanding and agreed to proceed.  Palliative Care was asked to follow this patient by consultation request of  Dr. Alvester Chou to address advance care planning and complex medical decision making. This is the initial visit.                                     ASSESSMENT AND PLAN / RECOMMENDATIONS:   Advance Care Planning/Goals of Care: Goals include to maximize quality of life and symptom management. Patient/health care surrogate gave his/her permission to discuss.Our advance care planning conversation included a discussion about:    The value and importance of advance care planning  Experiences with loved ones who have been seriously ill or have died  Exploration of personal, cultural or spiritual beliefs that might influence medical decisions  Exploration of goals of care in the event of a sudden injury or illness  Identification  of a healthcare agent  Review and  updating or creation of an  advance directive document . Decision not to resuscitate or to de-escalate disease focused treatments due to poor prognosis. CODE STATUS:  DNR  Symptom Management/Plan: 1. Pneumonia of upper lobe due to infectious organism, unspecified laterality -reviewed goals at this time with [name] and she reports that she would like Quillian Quince treated for his infection with antibiotics -try to avoid hospitalization Continue home health nursing monitoring each day -aspiration precautions -education provided that it is a recurrent issue that becomes more common as MS and dementia progress - amoxicillin-clavulanate (AUGMENTIN) 875-125 MG tablet; Place 1 tablet into feeding tube 2 (two) times daily.  Dispense: 20 tablet; Refill: 0 per family request -recommended pulmonary toilet with nebs, suction, as well  2. 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 Palliative care encounter -goals are still to treat infections with abx so done as above -they're not aware of having a MOST form so will plan to do this with them  when I visit 06/29/2021 -does have DNR reportedly  Follow up Palliative Care Visit: Palliative care will continue to follow for complex medical decision making, advance care planning, and clarification of goals. Return 06/29/21 in person  This visit was coded based on medical decision making (MDM).  PPS: 20%  HOSPICE ELIGIBILITY/DIAGNOSIS: nearing/secondary progressive MS with dementia,  dysphagia/aspiration  Chief Complaint: initial palliative care consult  HISTORY OF PRESENT ILLNESS:  [name] is a 46 y.o. year old male  with secondary progressive MS, protein calorie malnutrition with peg, pressure injuries, dementia, neurogenic bladder with suprapubic cath seen via video visit for initial palliative consult after discharge from [facility] hospice last week.    Wife, [name], is with him on camera.  Saturday am, he had drainage and red swollen eyes with discharge, he woke up with a cold--has a lot of congestion.  Not sob.  He did require oxygen b/c his O2 was 88%--on 3L this am.  He's very gurgly.  Urine also red in color.    He had done "well" for 2 weeks with good urine output that was yellow.  He had a lot of wounds on his left shoulder--healed, buttocks still open.  Wounds have almost healed per [name].  She expects them to heal in a week or two.    Before he lost his ability to speak, his MS happened very quickly.  He was working in [employer], but lost ability to speak over 2 weeks.  They never got to discuss.  [name] and his mother have been making decisions for him.  He went from relapsing-remitting to progressive very quickly--has been bedridden since 2012.  He has contractures of his UE and LEs.    He is having more hospitalizations than he used to.  He's had UTIs due to his suprapubic catheter--nurse daily for 12 hrs who changes it monthly and prn.  He gets chronic UTIs.    He was discharged home with hospice care in 8/22 b/c he had so many wounds and the infection was so severe and he was not responding to the IV abx.  [name] says he's very up and down.  [facility]--they thought he was back to baseline--alert and wide awake.    He blinks once for yes and had been back to that on Friday.  They would still liil like for him to have abx if needed.  She's been using warm compresses on his eyes.  She does have suction to use  for him.    He's on glucerna 1.5cal--9am-11pm.  All meds are via tube.  No recent seizures.  He may moan here and there, but no clear signs of pain.    He is having regular bms--daily lately (used to be every 3 days).      No known allergies to abx  History obtained from review of EMR, discussion with primary team, and interview with family, facility staff/caregiver and/or Mr. Walz.   I reviewed available labs, medications, imaging, studies and related documents from the EMR.  Records reviewed and summarized above.   ROS  Review of Systems  Constitutional:  Positive for activity change.  HENT:  Positive for trouble swallowing.        Nonverbal, blinks once for yes  Eyes:  Positive for discharge and redness.  Respiratory:  Positive for cough and shortness of breath.   Cardiovascular:  Negative for chest pain.  Gastrointestinal:  Negative for abdominal pain.  PEG  Genitourinary:        Suprapubic with reddish urine  Musculoskeletal:  Positive for gait problem.       Bedbound x years, contractures of arms and legs  Neurological:  Positive for speech difficulty and weakness.  Psychiatric/Behavioral:  Positive for agitation and confusion. Negative for sleep disturbance.        Dementia   Physical Exam: There were no vitals filed for this visit. There is no height or weight on file to calculate BMI. Wt Readings from Last 500 Encounters:  08/10/20 184 lb 15.5 oz (83.9 kg)  05/18/20 185 lb (83.9 kg)  12/10/17 172 lb 2.9 oz (78.1 kg)  07/08/17 174 lb 13.2 oz (79.3 kg)  04/27/16 177 lb (80.3 kg)  02/12/16 177 lb 6.4 oz (80.5 kg)  05/28/15 168 lb 1.6 oz (76.2 kg)  03/03/15 170 lb (77.1 kg)  11/02/14 154 lb 12.2 oz (70.2 kg)  07/03/13 182 lb 8.7 oz (82.8 kg)  01/26/13 210 lb 1.6 oz (95.3 kg)  12/15/12 195 lb 8.8 oz (88.7 kg)  01/29/12 183 lb 3.2 oz (83.1 kg)  05/31/11 145 lb 15.1 oz (66.2 kg)  04/23/11 166 lb (75.3 kg)  04/18/11 160 lb 15 oz (73 kg)  03/26/11 186 lb  15.2 oz (84.8 kg)  02/01/11 155 lb 13.8 oz (70.7 kg)  12/26/10 166 lb 14.4 oz (75.7 kg)   Physical Exam Vitals reviewed.  HENT:     Head: Normocephalic and atraumatic.     Right Ear: External ear normal.     Left Ear: External ear normal.     Mouth/Throat:     Comments: Mouth open Eyes:     Comments: Erythema of both conjunctivae with yellow drainage  Pulmonary:     Breath sounds: Rhonchi present.     Comments: Hypoxic to 88 on RA, improved into 90s with 3L Abdominal:     Comments: PEG in place  Musculoskeletal:     Comments: Contractures of upper and lower extremities  Skin:    Comments: Pressure injury to buttocks  Neurological:     Mental Status: He is alert.     Motor: Weakness present.    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   Lactic acidosis 04/27/2016   Acute cystitis with hematuria    Suprapubic catheter ([place])    G tube feedings ([place])    Flexion contractures    Encephalopathy    Bacteremia 05/27/2015   Sepsis ([place]) 05/25/2015   Altered mental state 05/25/2015   Altered mental status 05/25/2015   Fever, unspecified 03/04/2015   Quadriplegia and quadriparesis ([place]) 12/24/2014   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[place]) 12/24/2014   Fever    Hyperthermia, 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   Protein-calorie malnutrition, severe ([place]) 10/21/2014   UTI (urinary tract infection) 10/20/2014   Sepsis secondary to UTI ([place]) 10/20/2014   Leukocytosis 10/20/2014    Acute respiratory failure with hypoxia ([place]) 10/20/2014   Seizure disorder ([place]) 10/20/2014   Aspiration  pneumonitis ([place]) 10/20/2014   Neurogenic bladder 05/28/2011   Multiple sclerosis ([place]) 04/20/2011   FTT (failure to thrive) in adult 04/20/2011   Sacral decubitus ulcer, stage II ([place]) 04/20/2011   Quadriparesis (muscle weakness) 03/12/2011   PAST MEDICAL HISTORY:  Active Ambulatory Problems    Diagnosis Date Noted   Quadriparesis (muscle weakness) 03/12/2011   Multiple sclerosis ([place]) 04/20/2011   FTT (failure to thrive) in adult 04/20/2011   Sacral decubitus ulcer, stage II ([place]) 04/20/2011   Neurogenic bladder 05/28/2011   UTI (urinary tract infection) 10/20/2014   Sepsis secondary to UTI ([place]) 10/20/2014   Leukocytosis 10/20/2014   Acute respiratory failure with hypoxia ([place]) 10/20/2014   Seizure disorder ([place]) 10/20/2014   Aspiration pneumonitis ([place]) 10/20/2014   Hyperthermia, 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   Protein-calorie malnutrition, severe ([place]) 10/21/2014   Fever    Quadriplegia and quadriparesis ([place]) 12/24/2014   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[place]) 12/24/2014   Fever, unspecified 03/04/2015   Sepsis ([place]) 05/25/2015   Altered mental state 05/25/2015   Altered mental status 05/25/2015   Bacteremia 05/27/2015   Encephalopathy    Acute cystitis with hematuria    Suprapubic catheter ([place])    G tube feedings (HCC)    Flexion contractures    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   Lactic acidosis 04/27/2016   Pneumonia due to infectious organism    Acute metabolic encephalopathy 98/92/1194   Palliative care encounter    Goals of care, counseling/discussion    Palliative care by specialist    DNR (do not resuscitate) discussion    CAP (community acquired pneumonia) 05/18/2020   Osteomyelitis ([place]) 05/18/2020   Pressure injury of skin 05/20/2020   Femur fracture ([place]) 08/10/2020   Hyponatremia 08/10/2020   Hypokalemia 08/10/2020   Resolved  Ambulatory Problems    Diagnosis Date Noted   Multiple sclerosis exacerbation ([place]) 12/26/2010   UTI (urinary tract infection) 12/26/2010   Asthma 12/26/2010   Leukocytosis 02/01/2011   Weakness generalized 02/01/2011   Encephalopathy 03/12/2011   Hematuria 03/12/2011   Enterococcus UTI 03/20/2011   Malnutrition ([place]) 03/20/2011   Feeding difficulties 03/20/2011   UTI (lower urinary tract infection) 04/20/2011   Sepsis(995.91) 04/20/2011   Protein calorie malnutrition ([place]) 04/20/2011   Bladder calculi 05/28/2011   Severe malnutrition ([place]) 05/28/2011   Normocytic anemia 05/28/2011   Elbow pain 05/29/2011   Encephalopathy, toxic 02/01/2012   Hypokalemia 02/01/2012   Sepsis ([place]) 12/12/2012   Vomiting 12/12/2012   Bowel obstruction ([place]) 01/20/2013   Aspiration pneumonia ([place]) 01/20/2013   Hematemesis 01/20/2013   UTI (urinary tract infection) due to urinary indwelling Foley catheter ([place]) 10/20/2014   Pressure ulcer 10/20/2014   Pressure ulcer stage II 10/20/2014   Mucus plugging of bronchi    Malnutrition of moderate degree 10/29/2014   Acute respiratory failure (HCC)    UTI (lower urinary tract infection) 03/03/2015   Past Medical History:  Diagnosis Date   Childhood asthma    Depression    Dysphagia    MS (multiple sclerosis) ([place])    Neuromuscular disorder ([place])    Recurrent upper respiratory infection (URI)    Recurrent UTI    Shortness of breath    SOCIAL HX:  Social History   Tobacco Use   Smoking status: Former    Types: Cigarettes    Quit date: 02/02/1999    Years since quitting: 22.3   Smokeless tobacco: Former  Substance Use Topics   Alcohol use: No   FAMILY HX:  Family  History  Problem Relation Age of Onset   Asthma Mother       ALLERGIES: No Known Allergies    PERTINENT MEDICATIONS:  Outpatient Encounter Medications as of 06/12/2021  Medication Sig   Accu-Chek Softclix Lancets lancets use as directed to check blood sugar up to 4 times  daily   acetaminophen (TYLENOL) 500 MG tablet Take 1 tablet (500 mg total) by mouth every 8 (eight) hours as needed for fever (or pain). (Patient taking differently: Take 500 mg by mouth 2 (two) times daily.)   ALPRAZolam (XANAX) 0.25 MG tablet From 5/26 to 5/30 Give 0.18m per tube two times daily; then from 5/31 to 6/6 decrease to 0.271mper tube at bedtime. Then on 6/7 to 6/11, decrease to 0.2590mer tube at bedtime every OTHER day. Then STOP   AZO-CRANBERRY PO Take 1 tablet by mouth 2 (two) times daily.   baclofen (LIORESAL) 20 MG tablet Place 1 tablet (20 mg total) into feeding tube 4 (four) times daily.   bisacodyl (DULCOLAX) 10 MG suppository Place 10 mg rectally once as needed for moderate constipation.   Blood Glucose Monitoring Suppl (ACCU-CHEK GUIDE) w/Device KIT 1 each by Does not apply route as needed.   buPROPion (WELLBUTRIN) 75 MG tablet Place 1 tablet (75 mg total) into feeding tube 3 (three) times daily.   cloNIDine (CATAPRES) 0.1 MG tablet Place 1 tablet (0.1 mg total) into feeding tube 3 (three) times daily.   dantrolene (DANTRIUM) 50 MG capsule Give 50 mg by tube 4 (four) times daily. At 0800, 1200, 1600, 2000   diazepam (VALIUM) 5 MG tablet Place 1 tablet (5 mg total) into feeding tube at bedtime as needed for anxiety (sleep). Hold until completion of Ativan   famotidine (PEPCID) 40 MG/5ML suspension Take 20 mg by mouth daily.   glucose blood (ACCU-CHEK GUIDE) test strip Use to check blood sugar up to four times daily   guaifenesin (ROBITUSSIN) 100 MG/5ML syrup Take 200 mg by mouth 3 (three) times daily as needed for cough.   HYDROcodone-acetaminophen (NORCO) 7.5-325 MG per tablet Place 1 tablet into feeding tube every 6 (six) hours as needed (pain).    insulin detemir (LEVEMIR) 100 UNIT/ML injection Inject 0.22 mLs (22 Units total) into the skin daily.   Insulin Syringe-Needle U-100 (INSULIN SYRINGE .3CC/31GX5/16") 31G X 5/16" 0.3 ML MISC Use with Levemir prescription.    ipratropium-albuterol (DUONEB) 0.5-2.5 (3) MG/3ML SOLN Take 3 mLs by nebulization See admin instructions. Four times daily and as needed for shortness of breath   levETIRAcetam (KEPPRA) 100 MG/ML solution Take 1,200 mg by mouth in the morning, at noon, and at bedtime. 12 ml   Multiple Vitamin (MULTIVITAMIN WITH MINERALS) TABS tablet Place 1 tablet into feeding tube daily.   omega-3 acid ethyl esters (LOVAZA) 1 g capsule 1 capsule daily. tube   polyethylene glycol (MIRALAX / GLYCOLAX) packet Place 17 g into feeding tube daily. (Patient taking differently: Place 17 g into feeding tube every evening.)   polyvinyl alcohol (LIQUIFILM TEARS) 1.4 % ophthalmic solution Place 1 drop into both eyes 3 (three) times daily. (Patient taking differently: Place 1 drop into both eyes 2 (two) times daily.)   tiZANidine (ZANAFLEX) 2 MG tablet Take 1 tablet (2 mg total) by mouth every 6 (six) hours as needed for muscle spasms. (Patient taking differently: Place 2 mg into feeding tube 3 (three) times daily.)   vitamin C (ASCORBIC ACID) 500 MG tablet Place 500 mg into feeding tube daily.  Water For Irrigation, Sterile (FREE WATER) SOLN Place 240 mLs into feeding tube every 4 (four) hours.   No facility-administered encounter medications on file as of 06/12/2021.    Thank you for the opportunity to participate in the care of Mr. Devera.  The palliative care team will continue to follow. Please call our office at [phone] if we can be of additional assistance.   Hollace Kinnier, DO  COVID-19 PATIENT SCREENING TOOL Asked and negative response unless otherwise noted:  Have you had symptoms of covid, tested positive or been in contact with someone with symptoms/positive test in the past 5-10 days?  NO

## 2021-06-23 ENCOUNTER — Telehealth: Payer: Self-pay

## 2021-06-23 NOTE — Telephone Encounter (Signed)
325 pm.  Request received from Alvester Chou, NP needing assistance with dietician consult possibly from [place].  Spoke with Kerri Perches at [employer] and she advised in-home consults are not completed but telephonic consults could be done.  Contact information provided to [facility] who have [facility] dietician contact provider directly.

## 2021-06-29 ENCOUNTER — Other Ambulatory Visit: Payer: Medicaid Other | Admitting: Internal Medicine

## 2021-06-29 DIAGNOSIS — L8993 Pressure ulcer of unspecified site, stage 3: Secondary | ICD-10-CM

## 2021-06-29 DIAGNOSIS — E43 Unspecified severe protein-calorie malnutrition: Secondary | ICD-10-CM

## 2021-06-29 DIAGNOSIS — G35 Multiple sclerosis: Secondary | ICD-10-CM

## 2021-06-29 DIAGNOSIS — Z515 Encounter for palliative care: Secondary | ICD-10-CM

## 2021-06-29 DIAGNOSIS — J189 Pneumonia, unspecified organism: Secondary | ICD-10-CM

## 2021-06-29 NOTE — Progress Notes (Signed)
[facility] Visit Telephone: [phone]  Fax: [phone]   Date of encounter: 06/29/21 7:50 AM PATIENT NAME: Terry Palmer Terry Palmer [id number]   [phone] (home)  DOB: 1975-08-10 MRN: [id number] PRIMARY CARE PROVIDER:    Alvester Chou, NP,  [address] [place] [address] [phone]  REFERRING PROVIDER:   Alvester Chou, NP  RESPONSIBLE PARTY:    Contact Information     Name Relation Home Work [place] Spouse [phone]  [phone]   Ramsay,[name] Mother [phone]  [phone]   Molly, Maselli Father   [phone]        I met face to face with patient and family in his home/facility. Palliative Care was asked to follow this patient by consultation request of  Alvester Chou, NP to address advance care planning and complex medical decision making. This is follow-up visit.                                     ASSESSMENT AND PLAN / RECOMMENDATIONS:   Advance Care Planning/Goals of Care: Goals include to maximize quality of life and symptom management. Patient/health care surrogate gave his/her permission to discuss.Our advance care planning conversation included a discussion about:    The value and importance of advance care planning  Experiences with loved ones who have been seriously ill or have died  Exploration of personal, cultural or spiritual beliefs that might influence medical decisions  Exploration of goals of care in the event of a sudden injury or illness  Identification  of a healthcare agent--his wife, [name], is his HCPOA Review and updating or creation of an  advance directive document . Decision not to resuscitate or to de-escalate disease focused treatments due to poor prognosis. CODE STATUS:  Reviewed MOST today with [name]. She does want CPR performed and defibrillation if needed, but does not want Lavone to be intubated or placed on a  ventilator.  She wants comfort care in that instance and wants to avoid hospitalizations if at all possible otherwise.  She also requests he receive antibiotics for any infections he has and IVFs.  He is already on tube feeding and she has no desire to discontinue this at this time.  She wanted the DNR form previously completed shredded I did again provide education about small survival rates for out of hospital cardiac arrest, described the process and risks including rib fractures, pneumothorax, need for intubation and mechanical ventilation to sustain life, brain damage from anoxia, etc.  [name] would still like an attempt made at resuscitating Terry Palmer if he has a cardiac arrest.  She does not want subsequent intubation and mechanical ventilation should that be required.   She is not ready for him to die and wants infections treated.  She may reconsider this in the event that he is entirely unresponsive.  He currently communicates by blinking his eyes for yes responses.  He is otherwise nonverbal except moaning.  Symptom Management/Plan: 1. Pneumonia of upper lobe due to infectious organism, unspecified laterality -lungs now clear after suction today -continue nebulizer treatments, regular suction, aspiration precautions, good oral hygiene with toothbrushing and swabs, rinsing of mouth  2. 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--[name] describes his gradual functional decline he first had with use of different assistive devices until he lost the use of his legs,  as well, and became quadriplegic and nonverbal around 2012 -he is fully ADL dependent, has wounds, receiving tube feeding, meds in liquid form via peg, total care, nonverbal, communicates by blinking -cont in home care with 24x7 care shared between regular caregiver who is long-term and excellent with him and his wife on weekends -cont gabapentin titration per Alvester Chou, DNP for pain mgt which  has been helpful--did have minimal groaning during repositioning but med is going to be upped to tid for next week  3. Severe protein-calorie malnutrition ([name]) -to receive jevity 3 cans per day to cover 8-12 hrs continuously, prostat 75m po bid and juven 2x per day plus 720cc water daily--urine output has been good with light colored urine (does have considerable sediment) -no issues with residual reported  4. 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 Palliative care encounter -MOST completed today and education about interventions and risks provided -updated according to HCPOA wishes--pt/family is aChief Strategy Officerof their own story -continue visits alternating with RN from palliative and NP PCP [place]  Follow up Palliative Care Visit: Palliative care will continue to follow for complex medical decision making, advance care planning, and clarification of goals. Return 8/17 at 10am (me) and will ask RN to see patient late July as JAlmyra Freeis coming again in second week of July.  Also address needs prn.  This visit was coded based on medical decision making (MDM).  35 mins spent on ACP.  PPS: 10%  HOSPICE ELIGIBILITY/DIAGNOSIS: TBD  Chief Complaint: Follow-up palliative visit  HISTORY OF PRESENT ILLNESS:  DDIEM PAGNOTTAis a 46y.o. year old male with secondary progressive MS, dysphagia with recurrent aspiration, quadriplegia,  prior osteomyelitis, multiple pressure injuries, severe protein calorie malnutrition seen in his home with his wife, JLuvenia Starchand regular caregiver of 20+ yrs.    He's recovered well from his aspiration pneumonia.    History obtained from review of EMR, discussion with primary team, and interview with family, facility staff/caregiver and/or Mr.  [name]   I reviewed available labs, medications, imaging, studies and related documents from the EMR.  Records reviewed and summarized above.   ROS see hpi and a/p Review of Systems  Physical Exam: There were no vitals filed for this visit. There is no height or weight on file to calculate BMI. Wt Readings from Last 500 Encounters:  08/10/20 184 lb 15.5 oz (83.9 kg)  05/18/20 185 lb (83.9 kg)  12/10/17 172 lb 2.9 oz (78.1 kg)  07/08/17 174 lb 13.2 oz (79.3 kg)  04/27/16 177 lb (80.3 kg)  02/12/16 177 lb 6.4 oz (80.5 kg)  05/28/15 168 lb 1.6 oz (76.2 kg)  03/03/15 170 lb (77.1 kg)  11/02/14 154 lb 12.2 oz (70.2 kg)  07/03/13 182 lb 8.7 oz (82.8 kg)  01/26/13 210 lb 1.6 oz (95.3 kg)  12/15/12 195 lb 8.8 oz (88.7 kg)  01/29/12 183 lb 3.2 oz (83.1 kg)  05/31/11 145 lb 15.1 oz (66.2 kg)  04/23/11 166 lb (75.3 kg)  04/18/11 160 lb 15 oz (73 kg)  03/26/11 186 lb 15.2 oz (84.8 kg)  02/01/11 155 lb 13.8 oz (70.7 kg)  12/26/10 166 lb 14.4 oz (75.7 kg)   Physical Exam Constitutional:      Comments: Communicates with blinking of eyes  for yes  HENT:     Head: Normocephalic and atraumatic.     Mouth/Throat:     Mouth: Mucous membranes are moist.     Pharynx: Oropharynx is clear.  Cardiovascular:     Rate and Rhythm: Normal rate and regular rhythm.     Pulses: Normal pulses.     Heart sounds: Normal heart sounds.  Pulmonary:     Effort: Pulmonary effort is normal.     Breath sounds: Rhonchi present.  Abdominal:     General: Bowel sounds are normal. There is no distension.     Palpations: Abdomen is soft.     Tenderness: There is no abdominal tenderness.     Comments: PEG in place without surrounding erythema, warmth or drainage  Genitourinary:    Comments: Suprapubic cath in place with small volume light yellow urine and quite a bit of sediment in tubing Musculoskeletal:     Right lower leg: No edema.     Left lower leg: No edema.     Comments: Extensive contractures of  lower extremities and moderate in upper extremities, bedbound   Skin:    Comments: Stage 3 sacral pressure injury (initially was stage 4 with bone exposed), split in skin at scrotum, small stage 2 on left lower back, one of right lower back has healed   Neurological:     Mental Status: He is alert.     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   Lactic acidosis 04/27/2016   Acute cystitis with hematuria    Suprapubic catheter ([place])    G tube feedings ([place])    Flexion contractures    Encephalopathy    Bacteremia 05/27/2015   Sepsis ([place]) 05/25/2015   Altered mental state 05/25/2015   Altered mental status 05/25/2015   Fever, unspecified 03/04/2015   Quadriplegia and quadriparesis ([place]) 12/24/2014   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   Fever    Hyperthermia, 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   Protein-calorie malnutrition, severe ([place]) 10/21/2014   UTI (urinary tract infection) 10/20/2014   Sepsis secondary to UTI ([place]) 10/20/2014   Leukocytosis 10/20/2014   Acute respiratory failure with hypoxia ([place]) 10/20/2014   Seizure disorder ([place]) 10/20/2014   Aspiration pneumonitis ([place]) 10/20/2014   Neurogenic bladder 05/28/2011   Multiple sclerosis ([place]) 04/20/2011   FTT (failure to thrive) in adult 04/20/2011   Sacral decubitus ulcer, stage II ([place]) 04/20/2011   Quadriparesis (muscle weakness)  03/12/2011    PAST MEDICAL HISTORY:  Active Ambulatory Problems    Diagnosis Date Noted   Quadriparesis (muscle weakness) 03/12/2011   Multiple sclerosis ([place]) 04/20/2011   FTT (failure to thrive) in adult 04/20/2011   Sacral decubitus ulcer, stage II ([place]) 04/20/2011   Neurogenic bladder 05/28/2011   UTI (urinary tract infection) 10/20/2014   Sepsis secondary to UTI ([place]) 10/20/2014   Leukocytosis 10/20/2014   Acute respiratory failure with hypoxia ([place]) 10/20/2014  Seizure disorder ([place]) 10/20/2014   Aspiration pneumonitis ([place]) 10/20/2014   Hyperthermia, 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   Protein-calorie malnutrition, severe ([place]) 10/21/2014   Fever    Quadriplegia and quadriparesis ([place]) 12/24/2014   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   Fever, unspecified 03/04/2015   Sepsis ([place]) 05/25/2015   Altered mental state 05/25/2015   Altered mental status 05/25/2015   Bacteremia 05/27/2015   Encephalopathy    Acute cystitis with hematuria    Suprapubic catheter ([place])    G tube feedings (HCC)    Flexion contractures    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   Lactic acidosis 04/27/2016   Pneumonia due to infectious organism    Acute metabolic encephalopathy 11/94/1740   Palliative care encounter    Goals of care, counseling/discussion    Palliative care by specialist    DNR (do not resuscitate) discussion    CAP (community acquired pneumonia) 05/18/2020   Osteomyelitis ([place]) 05/18/2020   Pressure injury of skin 05/20/2020   Femur fracture ([place]) 08/10/2020   Hyponatremia 08/10/2020   Hypokalemia 08/10/2020   Resolved Ambulatory Problems    Diagnosis Date Noted   Multiple sclerosis exacerbation ([place]) 12/26/2010   UTI (urinary tract infection) 12/26/2010   Asthma 12/26/2010   Leukocytosis 02/01/2011   Weakness generalized 02/01/2011   Encephalopathy 03/12/2011   Hematuria 03/12/2011   Enterococcus UTI 03/20/2011   Malnutrition ([place]) 03/20/2011    Feeding difficulties 03/20/2011   UTI (lower urinary tract infection) 04/20/2011   Sepsis(995.91) 04/20/2011   Protein calorie malnutrition ([place]) 04/20/2011   Bladder calculi 05/28/2011   Severe malnutrition ([place]) 05/28/2011   Normocytic anemia 05/28/2011   Elbow pain 05/29/2011   Encephalopathy, toxic 02/01/2012   Hypokalemia 02/01/2012   Sepsis ([place]) 12/12/2012   Vomiting 12/12/2012   Bowel obstruction ([place]) 01/20/2013   Aspiration pneumonia ([place]) 01/20/2013   Hematemesis 01/20/2013   UTI (urinary tract infection) due to urinary indwelling Foley catheter ([place]) 10/20/2014   Pressure ulcer 10/20/2014   Pressure ulcer stage II 10/20/2014   Mucus plugging of bronchi    Malnutrition of moderate degree 10/29/2014   Acute respiratory failure (HCC)    UTI (lower urinary tract infection) 03/03/2015   Past Medical History:  Diagnosis Date   Childhood asthma    Depression    Dysphagia    MS (multiple sclerosis) ([place])    Neuromuscular disorder ([place])    Recurrent upper respiratory infection (URI)    Recurrent UTI    Shortness of breath     SOCIAL HX:  Social History   Tobacco Use   Smoking status: Former    Types: Cigarettes    Quit date: 02/02/1999    Years since quitting: 22.4   Smokeless tobacco: Former  Substance Use Topics   Alcohol use: No     ALLERGIES: No Known Allergies    PERTINENT MEDICATIONS:  Outpatient Encounter Medications as of 06/29/2021  Medication Sig   Accu-Chek Softclix Lancets lancets use as directed to check blood sugar up to 4 times daily   acetaminophen (TYLENOL) 500 MG tablet Take 1 tablet (500 mg total) by mouth every 8 (eight) hours as needed for fever (or pain). (Patient taking differently: Take 500 mg by mouth 2 (two) times daily.)   ALPRAZolam (XANAX) 0.25 MG tablet From 5/26 to 5/30 Give 0.65m per tube two times daily; then from 5/31 to 6/6 decrease to 0.272mper tube at bedtime. Then on 6/7 to 6/11, decrease to 0.2569mer tube at bedtime  every OTHER  day. Then STOP   amoxicillin-clavulanate (AUGMENTIN) 875-125 MG tablet Place 1 tablet into feeding tube 2 (two) times daily.   AZO-CRANBERRY PO Take 1 tablet by mouth 2 (two) times daily.   baclofen (LIORESAL) 20 MG tablet Place 1 tablet (20 mg total) into feeding tube 4 (four) times daily.   bisacodyl (DULCOLAX) 10 MG suppository Place 10 mg rectally once as needed for moderate constipation.   Blood Glucose Monitoring Suppl (ACCU-CHEK GUIDE) w/Device KIT 1 each by Does not apply route as needed.   buPROPion (WELLBUTRIN) 75 MG tablet Place 1 tablet (75 mg total) into feeding tube 3 (three) times daily.   cloNIDine (CATAPRES) 0.1 MG tablet Place 1 tablet (0.1 mg total) into feeding tube 3 (three) times daily.   dantrolene (DANTRIUM) 50 MG capsule Give 50 mg by tube 4 (four) times daily. At 0800, 1200, 1600, 2000   diazepam (VALIUM) 5 MG tablet Place 1 tablet (5 mg total) into feeding tube at bedtime as needed for anxiety (sleep). Hold until completion of Ativan   famotidine (PEPCID) 40 MG/5ML suspension Take 20 mg by mouth daily.   glucose blood (ACCU-CHEK GUIDE) test strip Use to check blood sugar up to four times daily   guaifenesin (ROBITUSSIN) 100 MG/5ML syrup Take 200 mg by mouth 3 (three) times daily as needed for cough.   HYDROcodone-acetaminophen (NORCO) 7.5-325 MG per tablet Place 1 tablet into feeding tube every 6 (six) hours as needed (pain).    insulin detemir (LEVEMIR) 100 UNIT/ML injection Inject 0.22 mLs (22 Units total) into the skin daily.   Insulin Syringe-Needle U-100 (INSULIN SYRINGE .3CC/31GX5/16") 31G X 5/16" 0.3 ML MISC Use with Levemir prescription.   ipratropium-albuterol (DUONEB) 0.5-2.5 (3) MG/3ML SOLN Take 3 mLs by nebulization See admin instructions. Four times daily and as needed for shortness of breath   levETIRAcetam (KEPPRA) 100 MG/ML solution Take 1,200 mg by mouth in the morning, at noon, and at bedtime. 12 ml   Multiple Vitamin (MULTIVITAMIN WITH  MINERALS) TABS tablet Place 1 tablet into feeding tube daily.   omega-3 acid ethyl esters (LOVAZA) 1 g capsule 1 capsule daily. tube   polyethylene glycol (MIRALAX / GLYCOLAX) packet Place 17 g into feeding tube daily. (Patient taking differently: Place 17 g into feeding tube every evening.)   polyvinyl alcohol (LIQUIFILM TEARS) 1.4 % ophthalmic solution Place 1 drop into both eyes 3 (three) times daily. (Patient taking differently: Place 1 drop into both eyes 2 (two) times daily.)   tiZANidine (ZANAFLEX) 2 MG tablet Take 1 tablet (2 mg total) by mouth every 6 (six) hours as needed for muscle spasms. (Patient taking differently: Place 2 mg into feeding tube 3 (three) times daily.)   vitamin C (ASCORBIC ACID) 500 MG tablet Place 500 mg into feeding tube daily.    Water For Irrigation, Sterile (FREE WATER) SOLN Place 240 mLs into feeding tube every 4 (four) hours.   No facility-administered encounter medications on file as of 06/29/2021.    Thank you for the opportunity to participate in the care of Mr. Kelemen.  The palliative care team will continue to follow. Please call our office at [phone] if we can be of additional assistance.   Hollace Kinnier, DO  COVID-19 PATIENT SCREENING TOOL Asked and negative response unless otherwise noted:  Have you had symptoms of covid, tested positive or been in contact with someone with symptoms/positive test in the past 5-10 days? no

## 2021-07-11 ENCOUNTER — Encounter: Payer: Self-pay | Admitting: Internal Medicine

## 2021-07-18 ENCOUNTER — Emergency Department (HOSPITAL_COMMUNITY): Payer: Medicare HMO

## 2021-07-18 ENCOUNTER — Inpatient Hospital Stay (HOSPITAL_COMMUNITY): Payer: Medicare HMO

## 2021-07-18 ENCOUNTER — Inpatient Hospital Stay (HOSPITAL_COMMUNITY)
Admission: EM | Admit: 2021-07-18 | Discharge: 2021-08-04 | DRG: 698 | Disposition: A | Discharge status: Home / Self Care | Payer: Medicare HMO | Attending: Internal Medicine | Admitting: Internal Medicine

## 2021-07-18 ENCOUNTER — Encounter (HOSPITAL_COMMUNITY): Payer: Self-pay | Admitting: Internal Medicine

## 2021-07-18 DIAGNOSIS — Z515 Encounter for palliative care: Secondary | ICD-10-CM | POA: Diagnosis not present

## 2021-07-18 DIAGNOSIS — Z23 Encounter for immunization: Secondary | ICD-10-CM

## 2021-07-18 DIAGNOSIS — G9341 Metabolic encephalopathy: Secondary | ICD-10-CM | POA: Diagnosis present

## 2021-07-18 DIAGNOSIS — Z7401 Bed confinement status: Secondary | ICD-10-CM

## 2021-07-18 DIAGNOSIS — G35 Multiple sclerosis: Secondary | ICD-10-CM | POA: Diagnosis present

## 2021-07-18 DIAGNOSIS — L89223 Pressure ulcer of left hip, stage 3: Secondary | ICD-10-CM | POA: Diagnosis present

## 2021-07-18 DIAGNOSIS — E870 Hyperosmolality and hypernatremia: Secondary | ICD-10-CM | POA: Diagnosis not present

## 2021-07-18 DIAGNOSIS — G40909 Epilepsy, unspecified, not intractable, without status epilepticus: Secondary | ICD-10-CM

## 2021-07-18 DIAGNOSIS — R7401 Elevation of levels of liver transaminase levels: Secondary | ICD-10-CM | POA: Diagnosis present

## 2021-07-18 DIAGNOSIS — N319 Neuromuscular dysfunction of bladder, unspecified: Secondary | ICD-10-CM | POA: Diagnosis present

## 2021-07-18 DIAGNOSIS — N390 Urinary tract infection, site not specified: Secondary | ICD-10-CM | POA: Diagnosis present

## 2021-07-18 DIAGNOSIS — R579 Shock, unspecified: Secondary | ICD-10-CM | POA: Diagnosis present

## 2021-07-18 DIAGNOSIS — D61818 Other pancytopenia: Secondary | ICD-10-CM | POA: Diagnosis present

## 2021-07-18 DIAGNOSIS — F0283 Dementia in other diseases classified elsewhere, unspecified severity, with mood disturbance: Secondary | ICD-10-CM | POA: Diagnosis present

## 2021-07-18 DIAGNOSIS — I10 Essential (primary) hypertension: Secondary | ICD-10-CM | POA: Diagnosis present

## 2021-07-18 DIAGNOSIS — G928 Other toxic encephalopathy: Secondary | ICD-10-CM | POA: Diagnosis present

## 2021-07-18 DIAGNOSIS — E871 Hypo-osmolality and hyponatremia: Secondary | ICD-10-CM | POA: Diagnosis present

## 2021-07-18 DIAGNOSIS — R6521 Severe sepsis with septic shock: Secondary | ICD-10-CM | POA: Diagnosis present

## 2021-07-18 DIAGNOSIS — A419 Sepsis, unspecified organism: Secondary | ICD-10-CM | POA: Diagnosis present

## 2021-07-18 DIAGNOSIS — J9602 Acute respiratory failure with hypercapnia: Secondary | ICD-10-CM | POA: Diagnosis present

## 2021-07-18 DIAGNOSIS — Z6823 Body mass index (BMI) 23.0-23.9, adult: Secondary | ICD-10-CM

## 2021-07-18 DIAGNOSIS — Z20822 Contact with and (suspected) exposure to covid-19: Secondary | ICD-10-CM | POA: Diagnosis present

## 2021-07-18 DIAGNOSIS — Y846 Urinary catheterization as the cause of abnormal reaction of the patient, or of later complication, without mention of misadventure at the time of the procedure: Secondary | ICD-10-CM | POA: Diagnosis present

## 2021-07-18 DIAGNOSIS — E43 Unspecified severe protein-calorie malnutrition: Secondary | ICD-10-CM | POA: Diagnosis present

## 2021-07-18 DIAGNOSIS — K9423 Gastrostomy malfunction: Secondary | ICD-10-CM | POA: Diagnosis not present

## 2021-07-18 DIAGNOSIS — R569 Unspecified convulsions: Secondary | ICD-10-CM | POA: Diagnosis not present

## 2021-07-18 DIAGNOSIS — J189 Pneumonia, unspecified organism: Secondary | ICD-10-CM | POA: Diagnosis present

## 2021-07-18 DIAGNOSIS — D72829 Elevated white blood cell count, unspecified: Secondary | ICD-10-CM | POA: Diagnosis not present

## 2021-07-18 DIAGNOSIS — Z87891 Personal history of nicotine dependence: Secondary | ICD-10-CM

## 2021-07-18 DIAGNOSIS — L89213 Pressure ulcer of right hip, stage 3: Secondary | ICD-10-CM | POA: Diagnosis present

## 2021-07-18 DIAGNOSIS — J9601 Acute respiratory failure with hypoxia: Secondary | ICD-10-CM | POA: Diagnosis present

## 2021-07-18 DIAGNOSIS — L89154 Pressure ulcer of sacral region, stage 4: Secondary | ICD-10-CM | POA: Diagnosis present

## 2021-07-18 DIAGNOSIS — R1310 Dysphagia, unspecified: Secondary | ICD-10-CM | POA: Diagnosis present

## 2021-07-18 DIAGNOSIS — B964 Proteus (mirabilis) (morganii) as the cause of diseases classified elsewhere: Secondary | ICD-10-CM | POA: Diagnosis present

## 2021-07-18 DIAGNOSIS — G8250 Quadriplegia, unspecified: Secondary | ICD-10-CM | POA: Diagnosis present

## 2021-07-18 DIAGNOSIS — N179 Acute kidney failure, unspecified: Secondary | ICD-10-CM | POA: Diagnosis present

## 2021-07-18 DIAGNOSIS — Z79899 Other long term (current) drug therapy: Secondary | ICD-10-CM

## 2021-07-18 DIAGNOSIS — R680 Hypothermia, not associated with low environmental temperature: Secondary | ICD-10-CM | POA: Diagnosis present

## 2021-07-18 DIAGNOSIS — Z87440 Personal history of urinary (tract) infections: Secondary | ICD-10-CM

## 2021-07-18 DIAGNOSIS — T83510A Infection and inflammatory reaction due to cystostomy catheter, initial encounter: Principal | ICD-10-CM | POA: Diagnosis present

## 2021-07-18 DIAGNOSIS — Z7189 Other specified counseling: Secondary | ICD-10-CM

## 2021-07-18 DIAGNOSIS — L89209 Pressure ulcer of unspecified hip, unspecified stage: Secondary | ICD-10-CM

## 2021-07-18 DIAGNOSIS — E119 Type 2 diabetes mellitus without complications: Secondary | ICD-10-CM | POA: Diagnosis present

## 2021-07-18 DIAGNOSIS — J9600 Acute respiratory failure, unspecified whether with hypoxia or hypercapnia: Secondary | ICD-10-CM | POA: Diagnosis not present

## 2021-07-18 DIAGNOSIS — G40901 Epilepsy, unspecified, not intractable, with status epilepticus: Secondary | ICD-10-CM | POA: Diagnosis present

## 2021-07-18 DIAGNOSIS — Z794 Long term (current) use of insulin: Secondary | ICD-10-CM

## 2021-07-18 LAB — I-STAT VENOUS BLOOD GAS, ED
Acid-Base Excess: 0 mmol/L (ref 0.0–2.0)
Bicarbonate: 30 mmol/L — ABNORMAL HIGH (ref 20.0–28.0)
Calcium, Ion: 1.27 mmol/L (ref 1.15–1.40)
HCT: 32 % — ABNORMAL LOW (ref 39.0–52.0)
Hemoglobin: 10.9 g/dL — ABNORMAL LOW (ref 13.0–17.0)
O2 Saturation: 99 %
Potassium: 4.6 mmol/L (ref 3.5–5.1)
Sodium: 132 mmol/L — ABNORMAL LOW (ref 135–145)
TCO2: 32 mmol/L (ref 22–32)
pCO2, Ven: 77.2 mmHg (ref 44–60)
pH, Ven: 7.198 — CL (ref 7.25–7.43)
pO2, Ven: 174 mmHg — ABNORMAL HIGH (ref 32–45)

## 2021-07-18 LAB — I-STAT ARTERIAL BLOOD GAS, ED
Acid-base deficit: 2 mmol/L (ref 0.0–2.0)
Bicarbonate: 25.8 mmol/L (ref 20.0–28.0)
Calcium, Ion: 1.4 mmol/L (ref 1.15–1.40)
HCT: 34 % — ABNORMAL LOW (ref 39.0–52.0)
Hemoglobin: 11.6 g/dL — ABNORMAL LOW (ref 13.0–17.0)
O2 Saturation: 100 %
Patient temperature: 82
Potassium: 4.2 mmol/L (ref 3.5–5.1)
Sodium: 135 mmol/L (ref 135–145)
TCO2: 27 mmol/L (ref 22–32)
pCO2 arterial: 36.7 mmHg (ref 32–48)
pH, Arterial: 7.407 (ref 7.35–7.45)
pO2, Arterial: 353 mmHg — ABNORMAL HIGH (ref 83–108)

## 2021-07-18 LAB — CBC WITH DIFFERENTIAL/PLATELET
Abs Immature Granulocytes: 0.05 10*3/uL (ref 0.00–0.07)
Basophils Absolute: 0 10*3/uL (ref 0.0–0.1)
Basophils Relative: 0 %
Eosinophils Absolute: 0 10*3/uL (ref 0.0–0.5)
Eosinophils Relative: 1 %
HCT: 33.9 % — ABNORMAL LOW (ref 39.0–52.0)
Hemoglobin: 9.9 g/dL — ABNORMAL LOW (ref 13.0–17.0)
Immature Granulocytes: 1 %
Lymphocytes Relative: 12 %
Lymphs Abs: 0.5 10*3/uL — ABNORMAL LOW (ref 0.7–4.0)
MCH: 26.5 pg (ref 26.0–34.0)
MCHC: 29.2 g/dL — ABNORMAL LOW (ref 30.0–36.0)
MCV: 90.9 fL (ref 80.0–100.0)
Monocytes Absolute: 0.1 10*3/uL (ref 0.1–1.0)
Monocytes Relative: 3 %
Neutro Abs: 3.1 10*3/uL (ref 1.7–7.7)
Neutrophils Relative %: 83 %
Platelets: 113 10*3/uL — ABNORMAL LOW (ref 150–400)
RBC: 3.73 MIL/uL — ABNORMAL LOW (ref 4.22–5.81)
RDW: 21.9 % — ABNORMAL HIGH (ref 11.5–15.5)
WBC: 3.7 10*3/uL — ABNORMAL LOW (ref 4.0–10.5)
nRBC: 0 % (ref 0.0–0.2)

## 2021-07-18 LAB — I-STAT CREATININE, ED: Creatinine, Ser: 1.3 mg/dL — ABNORMAL HIGH (ref 0.61–1.24)

## 2021-07-18 LAB — URINALYSIS, ROUTINE W REFLEX MICROSCOPIC
Bilirubin Urine: NEGATIVE
Glucose, UA: NEGATIVE mg/dL
Ketones, ur: NEGATIVE mg/dL
Nitrite: NEGATIVE
Protein, ur: NEGATIVE mg/dL
Specific Gravity, Urine: 1.005 (ref 1.005–1.030)
pH: 7 (ref 5.0–8.0)

## 2021-07-18 LAB — RAPID URINE DRUG SCREEN, HOSP PERFORMED
Amphetamines: NOT DETECTED
Barbiturates: NOT DETECTED
Benzodiazepines: POSITIVE — AB
Cocaine: NOT DETECTED
Opiates: NOT DETECTED
Tetrahydrocannabinol: NOT DETECTED

## 2021-07-18 LAB — GLUCOSE, CAPILLARY: Glucose-Capillary: 148 mg/dL — ABNORMAL HIGH (ref 70–99)

## 2021-07-18 LAB — HEMOGLOBIN A1C
Hgb A1c MFr Bld: 5.1 % (ref 4.8–5.6)
Mean Plasma Glucose: 99.67 mg/dL

## 2021-07-18 LAB — RESP PANEL BY RT-PCR (FLU A&B, COVID) ARPGX2
Influenza A by PCR: NEGATIVE
Influenza B by PCR: NEGATIVE
SARS Coronavirus 2 by RT PCR: NEGATIVE

## 2021-07-18 LAB — COMPREHENSIVE METABOLIC PANEL
ALT: 77 U/L — ABNORMAL HIGH (ref 0–44)
AST: 51 U/L — ABNORMAL HIGH (ref 15–41)
Albumin: 2.6 g/dL — ABNORMAL LOW (ref 3.5–5.0)
Alkaline Phosphatase: 141 U/L — ABNORMAL HIGH (ref 38–126)
Anion gap: 8 (ref 5–15)
BUN: 64 mg/dL — ABNORMAL HIGH (ref 6–20)
CO2: 26 mmol/L (ref 22–32)
Calcium: 8.9 mg/dL (ref 8.9–10.3)
Chloride: 97 mmol/L — ABNORMAL LOW (ref 98–111)
Creatinine, Ser: 1.14 mg/dL (ref 0.61–1.24)
GFR, Estimated: >60 mL/min (ref >60)
Glucose, Bld: 95 mg/dL (ref 70–99)
Potassium: 4.6 mmol/L (ref 3.5–5.1)
Sodium: 131 mmol/L — ABNORMAL LOW (ref 135–145)
Total Bilirubin: 0.2 mg/dL — ABNORMAL LOW (ref 0.3–1.2)
Total Protein: 6.3 g/dL — ABNORMAL LOW (ref 6.5–8.1)

## 2021-07-18 LAB — PROTIME-INR
INR: 1.1 (ref 0.8–1.2)
Prothrombin Time: 14.4 seconds (ref 11.4–15.2)

## 2021-07-18 LAB — CBG MONITORING, ED: Glucose-Capillary: 93 mg/dL (ref 70–99)

## 2021-07-18 LAB — TROPONIN I (HIGH SENSITIVITY): Troponin I (High Sensitivity): 8 ng/L (ref <18)

## 2021-07-18 LAB — LACTIC ACID, PLASMA: Lactic Acid, Venous: 1 mmol/L (ref 0.5–1.9)

## 2021-07-18 MED ORDER — INSULIN ASPART 100 UNIT/ML IJ SOLN
0.0000 [IU] | INTRAMUSCULAR | Status: DC
  Administered 2021-07-18: 1 [IU] via SUBCUTANEOUS
  Administered 2021-07-19: 2 [IU] via SUBCUTANEOUS
  Administered 2021-07-19 – 2021-07-20 (×2): 1 [IU] via SUBCUTANEOUS
  Administered 2021-07-20: 2 [IU] via SUBCUTANEOUS
  Administered 2021-07-20 (×3): 1 [IU] via SUBCUTANEOUS
  Administered 2021-07-21: 2 [IU] via SUBCUTANEOUS
  Administered 2021-07-21: 1 [IU] via SUBCUTANEOUS
  Administered 2021-07-21 (×2): 2 [IU] via SUBCUTANEOUS
  Administered 2021-07-22: 1 [IU] via SUBCUTANEOUS
  Administered 2021-07-22: 2 [IU] via SUBCUTANEOUS
  Administered 2021-07-22 – 2021-08-02 (×20): 1 [IU] via SUBCUTANEOUS

## 2021-07-18 MED ORDER — LACTATED RINGERS IV BOLUS: 500.0000 mL | Freq: Once | INTRAVENOUS | Status: DC

## 2021-07-18 MED ORDER — PANTOPRAZOLE 2 MG/ML SUSPENSION
40.0000 mg | Freq: Every day | ORAL | Status: DC
Start: 2021-07-18 — End: 2021-08-04
  Administered 2021-07-18 – 2021-08-04 (×18): 40 mg
  Filled 2021-07-18 (×17): qty 20

## 2021-07-18 MED ORDER — ATROPINE SULFATE 1 MG/10ML IJ SOSY
PREFILLED_SYRINGE | INTRAMUSCULAR | Status: AC
  Administered 2021-07-18: 1 mg via INTRAVENOUS
  Filled 2021-07-18: qty 10

## 2021-07-18 MED ORDER — METRONIDAZOLE 500 MG/100ML IV SOLN
500.0000 mg | Freq: Once | INTRAVENOUS | Status: AC
  Administered 2021-07-18: 500 mg via INTRAVENOUS
  Filled 2021-07-18: qty 100

## 2021-07-18 MED ORDER — SODIUM CHLORIDE 0.9 % IV SOLN
2.0000 g | Freq: Three times a day (TID) | INTRAVENOUS | Status: DC
  Administered 2021-07-19 (×2): 2 g via INTRAVENOUS
  Filled 2021-07-18 (×2): qty 12.5

## 2021-07-18 MED ORDER — VANCOMYCIN HCL IN DEXTROSE 1-5 GM/200ML-% IV SOLN
1000.0000 mg | Freq: Once | INTRAVENOUS | Status: AC
Start: 2021-07-18 — End: 2021-07-18
  Administered 2021-07-18: 1000 mg via INTRAVENOUS
  Filled 2021-07-18: qty 200

## 2021-07-18 MED ORDER — EPINEPHRINE 0.1 MG/10ML (10 MCG/ML) SYRINGE FOR IV PUSH (FOR BLOOD PRESSURE SUPPORT): 5.0000 ug | PREFILLED_SYRINGE | Freq: Once | INTRAVENOUS | Status: DC | PRN

## 2021-07-18 MED ORDER — FENTANYL CITRATE PF 50 MCG/ML IJ SOSY: 50.0000 ug | PREFILLED_SYRINGE | INTRAMUSCULAR | Status: DC | PRN

## 2021-07-18 MED ORDER — MIDAZOLAM HCL 2 MG/2ML IJ SOLN: 2.0000 mg | INTRAMUSCULAR | Status: DC | PRN

## 2021-07-18 MED ORDER — IPRATROPIUM-ALBUTEROL 0.5-2.5 (3) MG/3ML IN SOLN: 3.0000 mL | RESPIRATORY_TRACT | Status: DC | PRN

## 2021-07-18 MED ORDER — DOCUSATE SODIUM 50 MG/5ML PO LIQD: 100.0000 mg | Freq: Two times a day (BID) | ORAL | Status: DC | PRN

## 2021-07-18 MED ORDER — HEPARIN SODIUM (PORCINE) 5000 UNIT/ML IJ SOLN
5000.0000 [IU] | Freq: Three times a day (TID) | INTRAMUSCULAR | Status: DC
  Administered 2021-07-18 – 2021-07-21 (×8): 5000 [IU] via SUBCUTANEOUS
  Filled 2021-07-18 (×8): qty 1

## 2021-07-18 MED ORDER — NOREPINEPHRINE 4 MG/250ML-% IV SOLN
INTRAVENOUS | Status: AC
  Filled 2021-07-18: qty 250

## 2021-07-18 MED ORDER — NOREPINEPHRINE 4 MG/250ML-% IV SOLN
0.0000 ug/min | INTRAVENOUS | Status: DC
  Administered 2021-07-19: 25 ug/min via INTRAVENOUS
  Administered 2021-07-19: 22 ug/min via INTRAVENOUS
  Filled 2021-07-18: qty 250

## 2021-07-18 MED ORDER — PANTOPRAZOLE 2 MG/ML SUSPENSION
40.0000 mg | Freq: Every day | ORAL | Status: DC
Start: 2021-07-18 — End: 2021-07-19
  Filled 2021-07-18 (×2): qty 20

## 2021-07-18 MED ORDER — LEVETIRACETAM 100 MG/ML PO SOLN
1200.0000 mg | Freq: Two times a day (BID) | ORAL | Status: DC
  Administered 2021-07-18 – 2021-07-19 (×2): 1200 mg
  Filled 2021-07-18 (×3): qty 15

## 2021-07-18 MED ORDER — CALCIUM CHLORIDE 10 % IV SOLN
1.0000 g | Freq: Once | INTRAVENOUS | Status: AC
  Administered 2021-07-18: 1 g via INTRAVENOUS

## 2021-07-18 MED ORDER — SODIUM CHLORIDE 0.9 % IV BOLUS
1000.0000 mL | Freq: Once | INTRAVENOUS | Status: AC
  Administered 2021-07-18: 1000 mL via INTRAVENOUS

## 2021-07-18 MED ORDER — SODIUM CHLORIDE 0.9 % IV SOLN
2.0000 g | Freq: Once | INTRAVENOUS | Status: AC
  Administered 2021-07-18: 2 g via INTRAVENOUS
  Filled 2021-07-18: qty 12.5

## 2021-07-18 MED ORDER — VANCOMYCIN HCL IN DEXTROSE 1-5 GM/200ML-% IV SOLN
1000.0000 mg | Freq: Two times a day (BID) | INTRAVENOUS | Status: DC
  Administered 2021-07-19: 1000 mg via INTRAVENOUS
  Filled 2021-07-18: qty 200

## 2021-07-18 MED ORDER — SODIUM CHLORIDE 0.9 % IV SOLN
250.0000 mL | INTRAVENOUS | Status: DC
  Administered 2021-07-19: 10 mL/h via INTRAVENOUS
  Administered 2021-07-21: 250 mL via INTRAVENOUS

## 2021-07-18 MED ORDER — ATROPINE SULFATE 1 MG/10ML IJ SOSY
1.0000 mg | PREFILLED_SYRINGE | Freq: Once | INTRAMUSCULAR | Status: AC
Start: 2021-07-18 — End: 2021-07-18

## 2021-07-18 MED ORDER — CHLORHEXIDINE GLUCONATE CLOTH 2 % EX PADS
6.0000 | MEDICATED_PAD | Freq: Every day | CUTANEOUS | Status: DC
Start: 2021-07-18 — End: 2021-08-04
  Administered 2021-07-18 – 2021-08-04 (×18): 6 via TOPICAL

## 2021-07-18 MED ORDER — NOREPINEPHRINE 4 MG/250ML-% IV SOLN
2.0000 ug/min | INTRAVENOUS | Status: DC
  Administered 2021-07-18: 15 ug/min via INTRAVENOUS
  Administered 2021-07-18: 2 ug/min via INTRAVENOUS
  Filled 2021-07-18: qty 250

## 2021-07-18 MED ORDER — LACTATED RINGERS IV BOLUS
1000.0000 mL | Freq: Once | INTRAVENOUS | Status: AC
  Administered 2021-07-18: 1000 mL via INTRAVENOUS

## 2021-07-18 MED ORDER — ETOMIDATE 2 MG/ML IV SOLN
INTRAVENOUS | Status: DC | PRN
  Administered 2021-07-18: 20 mg via INTRAVENOUS

## 2021-07-18 MED ORDER — ROCURONIUM BROMIDE 50 MG/5ML IV SOLN
INTRAVENOUS | Status: DC | PRN
  Administered 2021-07-18: 80 mg via INTRAVENOUS

## 2021-07-18 MED ORDER — POLYETHYLENE GLYCOL 3350 17 G PO PACK: 17.0000 g | PACK | Freq: Every day | ORAL | Status: DC | PRN

## 2021-07-18 NOTE — ED Notes (Signed)
Pt has 3cmx3cm wound on right hip and 3x3 wound on left hip. Supra pubic cath and feeding tube in place from home.

## 2021-07-18 NOTE — ED Notes (Signed)
Unable to obtain blood cultures  

## 2021-07-18 NOTE — Progress Notes (Signed)
Attempting blood cultures but patient is difficult stick per ED RN

## 2021-07-18 NOTE — Progress Notes (Signed)
Antibiotics given, unable to draw blood cultures

## 2021-07-18 NOTE — Progress Notes (Addendum)
Pharmacy Antibiotic Note  Terry Palmer is a 46 y.o. male with MS who is quadriplegic and nonverbal admitted on 07/18/2021 with pneumonia. Patient previously had staph epidermidis bacteremia and proteus UTI in Aug 2022. Pharmacy has been consulted for vancomycin/cefepime dosing.   Last weight 184 lb from 08/2020. RN unable to obtain weight (Bed in ED doesn't have scale and patient is bedbound). Wife not available. Visual estimate is less than 184 lb. Will use 70 kg as an estimate. Estimated ClCr ~ 65 ml/min.  7/11 Vancomycin '1000mg'$  Q 12 hr Scr used: 1.3 mg/dL  Weight: 70 kg Vd coeff: 0.72 L/kg Est AUC: 522  Plan: Vancomycin '1000mg'$  q12 hr Cefepime 2g q 8hr Metronidazole per team  Monitor cultures, clinical status, renal function, vancomycin level Narrow abx as able and f/u duration    Height: '6\' 4"'$  (193 cm) IBW/kg (Calculated) : 86.8  No data recorded.  Recent Labs  Lab 07/18/21 1708 07/18/21 1730  WBC 3.7*  --   CREATININE  --  1.30*    CrCl cannot be calculated (Unknown ideal weight.).    No Known Allergies  Antimicrobials this admission: Vanc 7/11>>  Cefe 7/11 >>  MTZ 7/11 x1   Dose adjustments this admission: N/a  Microbiology results: 7/11 BCx: pend 7/11 UCx: pend  08/11/20 MRSA neg   Thank you for allowing pharmacy to be a part of this patient's care.  Benetta Spar, PharmD, BCPS, BCCP Clinical Pharmacist  Please check AMION for all [place] phone numbers After 10:00 PM, call [place] [phone]

## 2021-07-18 NOTE — Procedures (Addendum)
Central Venous Catheter Insertion Procedure Note  Terry Palmer  [id number]  1975/05/04  Date:07/18/21  Time:9:29 PM   Provider Performing:Aiven Kampe Jerilynn Mages Ayesha Rumpf   Procedure: Insertion of Non-tunneled Central Venous [phone]) with US guidance (97416)   Indication(s) Medication administration and Difficult access  Consent Risks of the procedure as well as the alternatives and risks of each were explained to the patient and/or caregiver.  Verbal consent was obtained due to the urgent nature of the procedure.  Anesthesia Topical only with 1% lidocaine   Timeout Verified patient identification, verified procedure, site/side was marked, verified correct patient position, special equipment/implants available, medications/allergies/relevant history reviewed, required imaging and test results available.  Sterile Technique Maximal sterile technique including full sterile barrier drape, hand hygiene, sterile gown, sterile gloves, mask, hair covering, sterile ultrasound probe cover (if used).     Procedure Description Area of catheter insertion was cleaned with chlorhexidine and draped in sterile fashion.  With real-time ultrasound guidance a central venous catheter was placed into the right internal jugular vein. Nonpulsatile blood flow and easy flushing noted in all ports.  The catheter was sutured in place and sterile dressing applied.  Complications/Tolerance None; patient tolerated the procedure well. Chest X-ray is ordered to verify placement for internal jugular or subclavian cannulation.   Chest x-ray is not ordered for femoral cannulation.  EBL Minimal  Specimen(s) None  Lestine Mount, [facility] 07/18/21 9:30 PM  Please see [email or web address] for pager details.  From 7A-7P if no response, please call [phone] After hours, please call [facility]Link [phone]

## 2021-07-18 NOTE — H&P (Signed)
NAME:  Terry Palmer, MRN:  [id number], DOB:  11/30/1975, LOS: 0 ADMISSION DATE:  07/18/2021, CONSULTATION DATE:  7/11 REFERRING MD:  Dr. Regenia Skeeter, CHIEF COMPLAINT:  AMS, Bradycardia   History of Present Illness:  46 y/o M who presented to [facility] ER on 7/11 with reports of low UOP.    At baseline, he lives at home with his wife and has a home Doctor, general practice.  He has MS and is bed bound / quad since at least 2013 with PEG in place.  [name] home health RN noted decreased UOP of <200 ml in 48h from his suprapubic catheter.  Almost no UOP on day of presentation. The RN changed the catheter with no change in output. At baseline, he is bed bound and blinks yes at baseline. He has been less alert. Family also reported a "rattling in his chest" for a few days. EMS was activated and patient was found to have a HR of 30.  Pacing was initiated by EMS at a rate of 70.  Initial BP 80/50. On arrival, the patient was activated as a possible sepsis. He was treated with 2.5L IVF. He was hypothermic and bair hugger was placed. The patient was noted to have a 3x3cm wound on the right and a 3x3 wound on the left hip. Per family, his wounds are actually improved.  His temperature on arrival was 82 degrees.  He was intubated in the ER per EDP. Initial labs - Na 131, K 4.6, Cl 97, CO2 26, glucose 95, BUN 64, cr 1.14, alk phos 141, albumin 2.6, AST 51, ALT 77, WBC 3.7, Hgb 9.9, and platelets 113.  ETT in good position on CXR, ? Basilar infiltrates on left.   PCCM called for ICU admission.   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 rate 18, height 6' 4"  (1.93 m), SpO2 100 %.        Intake/Output Summary (Last 24 hours) at 07/18/2021 Bosie Helper Last data filed at 07/18/2021 1732 Gross per 24 hour  Intake 1.21 ml  Output --  Net 1.21 ml   There were no vitals filed for this visit.  Examination: General: chronically ill appearing adult male on vent HENT: ETT in place, small thick secretions Lungs: Rhonci BL, passive on vent Cardiovascular: RRR, ext cool to touch, paced on monitor Abdomen: soft, hypoactive BS Extremities: contracted, global anasarca Neuro: currently sedated/paralyzed, baseline bedbound and blinks  VBG noted, ABG post intubation looks good CXR ?LLL PNA  Resolved Hospital Problem list     Assessment & Plan:   Acute respiratory failure w/ hypercarbia Possible LLL pneumonia P: -LTVV strategy with tidal volumes of 6-8 cc/kg ideal body weight -Goal plateau pressures less than 30 and driving pressures less than 15 -Wean PEEP/FiO2 for SpO2 >90% -VAP bundle in place -Daily SAT and SBT -PAD protocol in place -wean sedation for RASS goal 0 to -1 -Follow intermittent CXR and ABG PRN   Acute Metabolic Encephalopathy Hx of multiple  sclerosis: non-communicative; blinks at baseline for yes Quadriplegic P: -limit sedating meds -hold home baclofen, dantrolene, tizanidine   Hypothermia Sepsis Bradycardia P: -telemetry monitoring -continue pacing -continue bair hugger -broad spectrum abx -follow cultures -trend LA and troponin   DMT2 P: -check a1c -ssi and cbg monitoring   AKI Neurogenic bladder P: -Trend BMP / urinary output -Replace electrolytes as indicated -Avoid nephrotoxic agents, ensure adequate renal perfusion   Pancytopenia P: -trend CBC -transfuse for hgb <7   Mildly elevated LFTs P: -trend LFTs   Mild hyponatremia P: -trend na -consider  further work up pending rewarming   Hx of seizure disorder P: -continue keppra   Hx of HTN P: -hold clonidine   Sacral Wound POA P: -WOC consult -alleviate as able   Hx of depression P: -hold home xanax, valium, wellbutrin for now  Best Practice (right click and "Reselect all SmartList Selections" daily)  Diet/type: tubefeeds DVT prophylaxis: prophylactic heparin  GI prophylaxis: PPI Lines: N/A Foley:  N/A Code Status:  full code Last date of multidisciplinary goals of care discussion: confirmed full code with wife at bedside 7/11; does not want prolonged life support  Labs   CBC: Recent Labs  Lab 07/18/21 1708 07/18/21 1730  WBC 3.7*  --   NEUTROABS 3.1  --   HGB 9.9* 10.9*  HCT 33.9* 32.0*  MCV 90.9  --   PLT 113*  --     Basic Metabolic Panel: Recent Labs  Lab 07/18/21 1708 07/18/21 1730  NA 131* 132*  K 4.6 4.6  CL 97*  --   CO2 26  --   GLUCOSE 95  --   BUN 64*  --   CREATININE 1.14 1.30*  CALCIUM 8.9  --    GFR: CrCl cannot be calculated (Unknown ideal weight.). Recent Labs  Lab 07/18/21 1708  WBC 3.7*  LATICACIDVEN 1.0    Liver Function Tests: Recent Labs  Lab 07/18/21 1708  AST 51*  ALT 77*  ALKPHOS 141*  BILITOT 0.2*  PROT 6.3*  ALBUMIN 2.6*   No results for input(s): "LIPASE", "AMYLASE" in the last 168 hours. No results for input(s): "AMMONIA" in the last 168 hours.  ABG    Component Value Date/Time   PHART 7.409 05/22/2020 0528   PCO2ART 38.7 05/22/2020 0528   PO2ART 349 (H) 05/22/2020 0528   HCO3 30.0 (H) 07/18/2021 1730   TCO2 32 07/18/2021 1730   ACIDBASEDEF 0.1 05/22/2020 0528   O2SAT 99 07/18/2021 1730     Coagulation Profile: Recent Labs  Lab 07/18/21 1708  INR 1.1    Cardiac Enzymes: No results for input(s): "CKTOTAL", "CKMB", "CKMBINDEX", "TROPONINI" in the last 168 hours.  HbA1C: Hgb A1c MFr Bld  Date/Time Value Ref Range Status  08/10/2020 05:42 PM 7.1 (H) 4.8 - 5.6 % Final    Comment:     (NOTE) Pre diabetes:          5.7%-6.4%  Diabetes:              >6.4%  Glycemic control for   <7.0% adults with diabetes   05/20/2020 08:27 AM 8.6 (H) 4.8 - 5.6 % Final    Comment:    (NOTE) Pre diabetes:          5.7%-6.4%  Diabetes:              >6.4%  Glycemic control for   <7.0% adults with diabetes     CBG: Recent Labs  Lab 07/18/21 [place]  93    Review of Systems:   Unable to complete as patient is altered on mechanical ventilation.   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   Surgical History:   Past Surgical History:  Procedure Laterality Date   GASTROSTOMY  04/16/2011   Procedure: GASTROSTOMY;  Surgeon: Zenovia Jarred, MD;  Location: [facility];  Service: General;  Laterality: N/A;  Open G-Tube placement   LUMBAR PUNCTURE  10/12/2002   SUPRAPUBIC CYSTOSTOMY       Social History:   reports that he quit smoking about 22 years ago. His smoking use included cigarettes. He has quit using smokeless tobacco. He reports that he does not drink alcohol.   Family History:  His family history includes Asthma in his mother.   Allergies No Known Allergies   Home Medications  Prior to Admission medications   Medication Sig Start Date End Date Taking? Authorizing Provider  Accu-Chek Softclix Lancets lancets use as directed to check blood sugar up to 4 times daily 06/02/20   Mariel Aloe, MD  acetaminophen (TYLENOL) 500 MG tablet Take 1 tablet (500 mg  total) by mouth every 8 (eight) hours as needed for fever (or pain). Patient taking differently: Take 500 mg by mouth 2 (two) times daily. 03/05/15   Debbe Odea, MD  ALPRAZolam Duanne Moron) 0.25 MG tablet From 5/26 to 5/30 Give 0.10m per tube two times daily; then from 5/31 to 6/6 decrease to 0.234mper tube at bedtime. Then on 6/7 to 6/11, decrease to 0.2510mer tube at bedtime every OTHER day. Then STOP 06/02/20   NetMariel AloeD  amoxicillin-clavulanate (AUGMENTIN) 875-125 MG tablet Place 1 tablet into feeding tube 2 (two) times daily. 06/12/21   Reed, Tiffany L, DO  AZO-CRANBERRY PO Take 1 tablet by mouth 2 (two) times daily.    [provider]  baclofen (LIORESAL) 20 MG tablet Place 1 tablet (20 mg total) into feeding tube 4 (four) times daily. 06/12/21   Reed, Tiffany L, DO  bisacodyl (DULCOLAX) 10 MG suppository Place 10 mg rectally once as needed for moderate constipation.    [provider]  Blood Glucose Monitoring Suppl (ACCU-CHEK GUIDE) w/Device KIT 1 each by Does not apply route as needed. 06/02/20   NetMariel AloeD  buPROPion (WELLBUTRIN) 75 MG tablet Place 1 tablet (75 mg total) into feeding tube 3 (three) times daily. 01/24/13   KriBonnielee HaffD  cloNIDine (CATAPRES) 0.1 MG tablet Place 1 tablet (0.1 mg total) into feeding tube 3 (three) times daily. 06/05/20 09/03/20  NetMariel AloeD  dantrolene (DANTRIUM) 50 MG capsule Give 50 mg by tube 4 (four) times daily. At 0800, 1200, 1600, 2000    [provider]  diazepam (VALIUM) 5 MG tablet Place 1 tablet (5 mg total) into feeding tube at bedtime as needed for anxiety (sleep). Hold until completion of Ativan 06/05/20   NetMariel AloeD  famotidine (PEPCID) 40 MG/5ML suspension Take 20 mg by mouth daily. 04/28/20   [provider]  glucose blood (ACCU-CHEK GUIDE) test strip Use to check blood sugar up to four times daily 06/02/20  Mariel Aloe, MD  guaifenesin (ROBITUSSIN) 100 MG/5ML syrup Take 200  mg by mouth 3 (three) times daily as needed for cough.    [provider]  HYDROcodone-acetaminophen (NORCO) 7.5-325 MG per tablet Place 1 tablet into feeding tube every 6 (six) hours as needed (pain).     [provider]  insulin detemir (LEVEMIR) 100 UNIT/ML injection Inject 0.22 mLs (22 Units total) into the skin daily. 06/06/20 09/04/20  Mariel Aloe, MD  Insulin Syringe-Needle U-100 (INSULIN SYRINGE .3CC/31GX5/16") 31G X 5/16" 0.3 ML MISC Use with Levemir prescription. 06/05/20   Mariel Aloe, MD  ipratropium-albuterol (DUONEB) 0.5-2.5 (3) MG/3ML SOLN Take 3 mLs by nebulization See admin instructions. Four times daily and as needed for shortness of breath 06/12/21   Reed, Tiffany L, DO  levETIRAcetam (KEPPRA) 100 MG/ML solution Take 1,200 mg by mouth in the morning, at noon, and at bedtime. 12 ml    [provider]  Multiple Vitamin (MULTIVITAMIN WITH MINERALS) TABS tablet Place 1 tablet into feeding tube daily. 01/24/13   Bonnielee Haff, MD  omega-3 acid ethyl esters (LOVAZA) 1 g capsule 1 capsule daily. tube 06/13/20   [provider]  polyethylene glycol (MIRALAX / GLYCOLAX) packet Place 17 g into feeding tube daily. Patient taking differently: Place 17 g into feeding tube every evening. 01/24/13   Bonnielee Haff, MD  polyvinyl alcohol (LIQUIFILM TEARS) 1.4 % ophthalmic solution Place 1 drop into both eyes 3 (three) times daily. Patient taking differently: Place 1 drop into both eyes 2 (two) times daily. 01/24/13   Bonnielee Haff, MD  tiZANidine (ZANAFLEX) 2 MG tablet Take 1 tablet (2 mg total) by mouth every 6 (six) hours as needed for muscle spasms. Patient taking differently: Place 2 mg into feeding tube 3 (three) times daily. 11/25/15   Kathrynn Ducking, MD  vitamin C (ASCORBIC ACID) 500 MG tablet Place 500 mg into feeding tube daily.     [provider]  Water For Irrigation, Sterile (FREE WATER) SOLN Place 240 mLs into feeding tube every 4  (four) hours. 01/24/13   Bonnielee Haff, MD     Critical care time: 32 min     Erskine Emery MD PCCM

## 2021-07-18 NOTE — Progress Notes (Signed)
Notified bedside nurse of need to draw and administer antibiotics and blood cultures.  

## 2021-07-18 NOTE — Progress Notes (Signed)
Notified provider and bedside nurse of need to order and administer fluid bolus, pt needs 2517 cc fluid.Marland Kitchen

## 2021-07-18 NOTE — ED Notes (Signed)
Bair hugger placed on pt.

## 2021-07-18 NOTE — Progress Notes (Signed)
Elink following code sepsis °

## 2021-07-18 NOTE — ED Provider Notes (Addendum)
[facility] Provider Note   CSN: [id number] Arrival date & time: 07/18/21  1648     History  No chief complaint on file.   Terry Palmer is a 46 y.o. male.  HPI 46 year old male with history of severe MS presents with altered mental status.  History is from EMS and then later the wife.  He has had rattling in his chest for a few days and she is concerned about pneumonia.  Decreased urine output yesterday to his suprapubic catheter.  Almost none today and the nurse that went to his house change the catheter but there was essentially no output.  Normally blinks yes at baseline but currently is not responding which is abnormal for him.  Gets recurrent UTIs.  Wounds on his buttock which are chronic seem to be improving.  EMS found him to be significantly bradycardic and hypotensive and he is currently being paced with good response.  Initial heart rate was in the 30s.   Home Medications Prior to Admission medications   Medication Sig Start Date End Date Taking? Authorizing Provider  Accu-Chek Softclix Lancets lancets use as directed to check blood sugar up to 4 times daily 06/02/20   Mariel Aloe, MD  acetaminophen (TYLENOL) 500 MG tablet Take 1 tablet (500 mg total) by mouth every 8 (eight) hours as needed for fever (or pain). Patient taking differently: Take 500 mg by mouth 2 (two) times daily. 03/05/15   Debbe Odea, MD  ALPRAZolam Duanne Moron) 0.25 MG tablet From 5/26 to 5/30 Give 0.54m per tube two times daily; then from 5/31 to 6/6 decrease to 0.278mper tube at bedtime. Then on 6/7 to 6/11, decrease to 0.252mer tube at bedtime every OTHER day. Then STOP 06/02/20   NetMariel AloeD  amoxicillin-clavulanate (AUGMENTIN) 875-125 MG tablet Place 1 tablet into feeding tube 2 (two) times daily. 06/12/21   Reed, Tiffany L, DO  AZO-CRANBERRY PO Take 1 tablet by mouth 2 (two) times daily.    [provider]  baclofen (LIORESAL) 20 MG tablet  Place 1 tablet (20 mg total) into feeding tube 4 (four) times daily. 06/12/21   Reed, Tiffany L, DO  bisacodyl (DULCOLAX) 10 MG suppository Place 10 mg rectally once as needed for moderate constipation.    [provider]  Blood Glucose Monitoring Suppl (ACCU-CHEK GUIDE) w/Device KIT 1 each by Does not apply route as needed. 06/02/20   NetMariel AloeD  buPROPion (WELLBUTRIN) 75 MG tablet Place 1 tablet (75 mg total) into feeding tube 3 (three) times daily. 01/24/13   KriBonnielee HaffD  cloNIDine (CATAPRES) 0.1 MG tablet Place 1 tablet (0.1 mg total) into feeding tube 3 (three) times daily. 06/05/20 09/03/20  NetMariel AloeD  dantrolene (DANTRIUM) 50 MG capsule Give 50 mg by tube 4 (four) times daily. At 0800, 1200, 1600, 2000    [provider]  diazepam (VALIUM) 5 MG tablet Place 1 tablet (5 mg total) into feeding tube at bedtime as needed for anxiety (sleep). Hold until completion of Ativan 06/05/20   NetMariel AloeD  famotidine (PEPCID) 40 MG/5ML suspension Take 20 mg by mouth daily. 04/28/20   [provider]  glucose blood (ACCU-CHEK GUIDE) test strip Use to check blood sugar up to four times daily 06/02/20   NetMariel AloeD  guaifenesin (ROBITUSSIN) 100 MG/5ML syrup Take 200 mg by mouth 3 (three) times daily as needed for cough.    [provider]  HYDROcodone-acetaminophen (NORCO) 7.5-325 MG per tablet Place 1 tablet into feeding tube every 6 (six) hours as needed (pain).     [provider]  insulin detemir (LEVEMIR) 100 UNIT/ML injection Inject 0.22 mLs (22 Units total) into the skin daily. 06/06/20 09/04/20  Mariel Aloe, MD  Insulin Syringe-Needle U-100 (INSULIN SYRINGE .3CC/31GX5/16") 31G X 5/16" 0.3 ML MISC Use with Levemir prescription. 06/05/20   Mariel Aloe, MD  ipratropium-albuterol (DUONEB) 0.5-2.5 (3) MG/3ML SOLN Take 3 mLs by nebulization See admin instructions. Four times daily and as needed for shortness of breath 06/12/21    Reed, Tiffany L, DO  levETIRAcetam (KEPPRA) 100 MG/ML solution Take 1,200 mg by mouth in the morning, at noon, and at bedtime. 12 ml    [provider]  Multiple Vitamin (MULTIVITAMIN WITH MINERALS) TABS tablet Place 1 tablet into feeding tube daily. 01/24/13   Bonnielee Haff, MD  omega-3 acid ethyl esters (LOVAZA) 1 g capsule 1 capsule daily. tube 06/13/20   [provider]  polyethylene glycol (MIRALAX / GLYCOLAX) packet Place 17 g into feeding tube daily. Patient taking differently: Place 17 g into feeding tube every evening. 01/24/13   Bonnielee Haff, MD  polyvinyl alcohol (LIQUIFILM TEARS) 1.4 % ophthalmic solution Place 1 drop into both eyes 3 (three) times daily. Patient taking differently: Place 1 drop into both eyes 2 (two) times daily. 01/24/13   Bonnielee Haff, MD  tiZANidine (ZANAFLEX) 2 MG tablet Take 1 tablet (2 mg total) by mouth every 6 (six) hours as needed for muscle spasms. Patient taking differently: Place 2 mg into feeding tube 3 (three) times daily. 11/25/15   Kathrynn Ducking, MD  vitamin C (ASCORBIC ACID) 500 MG tablet Place 500 mg into feeding tube daily.     [provider]  Water For Irrigation, Sterile (FREE WATER) SOLN Place 240 mLs into feeding tube every 4 (four) hours. 01/24/13   Bonnielee Haff, MD      Allergies    Patient has no known allergies.    Review of Systems   Review of Systems  Unable to perform ROS: Patient unresponsive    Physical Exam Updated Vital Signs BP 113/88   Pulse 80   Temp (!) 83.5 F (28.6 C) (Rectal)   Resp 18   Ht 6' 4"  (1.93 m)   Wt 69.9 kg   SpO2 99%   BMI 18.76 kg/m  Physical Exam Vitals and nursing note reviewed.  Constitutional:      Appearance: He is well-developed. He is ill-appearing.  HENT:     Head: Normocephalic and atraumatic.  Cardiovascular:     Rate and Rhythm: Normal rate and regular rhythm.     Heart sounds: Normal heart sounds.  Pulmonary:     Effort: Tachypnea present. No  accessory muscle usage.     Breath sounds: Rhonchi present.  Abdominal:     General: There is no distension.     Palpations: Abdomen is soft.     Tenderness: There is no abdominal tenderness.     Comments: PEG tube and suprapubic catheter in place  Skin:    General: Skin is warm.     Comments: Cool extremities diffusely  Neurological:     Mental Status: He is lethargic.     Comments: Diffuse contractures     ED Results / Procedures / Treatments   Labs (all labs ordered are listed, but only abnormal results are displayed) Labs Reviewed  COMPREHENSIVE METABOLIC PANEL - Abnormal;  Notable for the following components:      Result Value   Sodium 131 (*)    Chloride 97 (*)    BUN 64 (*)    Total Protein 6.3 (*)    Albumin 2.6 (*)    AST 51 (*)    ALT 77 (*)    Alkaline Phosphatase 141 (*)    Total Bilirubin 0.2 (*)    All other components within normal limits  URINALYSIS, ROUTINE W REFLEX MICROSCOPIC - Abnormal; Notable for the following components:   Color, Urine STRAW (*)    Hgb urine dipstick MODERATE (*)    Leukocytes,Ua LARGE (*)    Bacteria, UA RARE (*)    All other components within normal limits  RAPID URINE DRUG SCREEN, HOSP PERFORMED - Abnormal; Notable for the following components:   Benzodiazepines POSITIVE (*)    All other components within normal limits  CBC WITH DIFFERENTIAL/PLATELET - Abnormal; Notable for the following components:   WBC 3.7 (*)    RBC 3.73 (*)    Hemoglobin 9.9 (*)    HCT 33.9 (*)    MCHC 29.2 (*)    RDW 21.9 (*)    Platelets 113 (*)    Lymphs Abs 0.5 (*)    All other components within normal limits  GLUCOSE, CAPILLARY - Abnormal; Notable for the following components:   Glucose-Capillary 148 (*)    All other components within normal limits  I-STAT VENOUS BLOOD GAS, ED - Abnormal; Notable for the following components:   pH, Ven 7.198 (*)    pCO2, Ven 77.2 (*)    pO2, Ven 174 (*)    Bicarbonate 30.0 (*)    Sodium 132 (*)    HCT  32.0 (*)    Hemoglobin 10.9 (*)    All other components within normal limits  I-STAT CREATININE, ED - Abnormal; Notable for the following components:   Creatinine, Ser 1.30 (*)    All other components within normal limits  I-STAT ARTERIAL BLOOD GAS, ED - Abnormal; Notable for the following components:   pO2, Arterial 353 (*)    HCT 34.0 (*)    Hemoglobin 11.6 (*)    All other components within normal limits  RESP PANEL BY RT-PCR (FLU A&B, COVID) ARPGX2  URINE CULTURE  CULTURE, BLOOD (ROUTINE X 2)  CULTURE, BLOOD (ROUTINE X 2)  CULTURE, RESPIRATORY W GRAM STAIN  MRSA NEXT GEN BY PCR, NASAL  LACTIC ACID, PLASMA  PROTIME-INR  HEMOGLOBIN A1C  LACTIC ACID, PLASMA  T4, FREE  BLOOD GAS, ARTERIAL  HIV ANTIBODY (ROUTINE TESTING W REFLEX)  BLOOD GAS, ARTERIAL  CBC  COMPREHENSIVE METABOLIC PANEL  MAGNESIUM  PHOSPHORUS  LACTIC ACID, PLASMA  PROCALCITONIN  PROCALCITONIN  AMMONIA  RAPID URINE DRUG SCREEN, HOSP PERFORMED  TSH  CBG MONITORING, ED  TYPE AND SCREEN  TROPONIN I (HIGH SENSITIVITY)  TROPONIN I (HIGH SENSITIVITY)    EKG None  Radiology DG CHEST PORT 1 VIEW  Result Date: 07/18/2021 CLINICAL DATA:  Central line placement. EXAM: PORTABLE CHEST 1 VIEW COMPARISON:  Radiograph earlier today. FINDINGS: New right internal jugular central line tip overlies the atrial caval junction. No pneumothorax. Endotracheal and enteric tubes remain in place. Persistent low lung volumes. Left lung base and perihilar opacities are stable. Additional right infrahilar opacity. Cannot exclude left pleural effusion IMPRESSION: 1. New right internal jugular central line with tip overlying the atrial caval junction. No pneumothorax. 2. Appropriate positioning of the endotracheal and enteric tubes. 3. Stable bibasilar and left  perihilar opacities suspicious for multifocal pneumonia. Electronically Signed   By: Keith Rake M.D.   On: 07/18/2021 21:42   DG Chest Portable 1 View  Result Date:  07/18/2021 CLINICAL DATA:  Shortness of breath, status post intubation EXAM: PORTABLE CHEST 1 VIEW COMPARISON:  Previous studies including the examination done earlier today FINDINGS: There is interval placement of endotracheal tube with its tip 2.2 cm above the carina. Enteric tube is noted traversing the esophagus with its distal portion in the stomach. Transverse diameter of heart is slightly increased. There is poor inspiration. Central pulmonary vessels are less prominent. There are patchy infiltrates in the left parahilar region and both lower lung fields. There is blunting of left lateral CP angle. There is no pneumothorax. There is possible gaseous distention of hepatic flexure in right upper quadrant of abdomen. IMPRESSION: There are infiltrates in left mid and both lower lung fields suggesting atelectasis/pneumonia. Electronically Signed   By: Elmer Picker M.D.   On: 07/18/2021 19:11   DG Abdomen 1 View  Result Date: 07/18/2021 CLINICAL DATA:  NG tube placement. EXAM: ABDOMEN - 1 VIEW COMPARISON:  CT abdomen and pelvis 08/13/2020. FINDINGS: Nasogastric tube tip is at the level of the mid stomach. There is diffuse gaseous distention of the colon similar to the prior exam. IMPRESSION: Nasogastric tube tip at the level of the mid stomach. Diffuse gaseous distention of the colon. Please correlate clinically for colonic ileus or obstruction. Electronically Signed   By: Ronney Asters M.D.   On: 07/18/2021 18:45   DG Chest Portable 1 View  Result Date: 07/18/2021 CLINICAL DATA:  Altered unresponsive EXAM: PORTABLE CHEST 1 VIEW COMPARISON:  08/10/2020 FINDINGS: Low lung volumes. Central bronchovascular crowding. Consolidation at the left base with patchy airspace disease in the left perihilar region. Partially obscured cardiomediastinal silhouette. No pneumothorax. Amorphous opacity overlying right upper quadrant, possible artifact. IMPRESSION: 1. Hypoventilatory change. Consolidation at left lung  base with heterogeneous left greater than right airspace disease, potentially due to pneumonia. Electronically Signed   By: Donavan Foil M.D.   On: 07/18/2021 17:29    Procedures .Critical Care  Performed by: Sherwood Gambler, MD Authorized by: Sherwood Gambler, MD   Critical care provider statement:    Critical care time (minutes):  75   Critical care time was exclusive of:  Separately billable procedures and treating other patients   Critical care was necessary to treat or prevent imminent or life-threatening deterioration of the following conditions:  Sepsis, respiratory failure, CNS failure or compromise and shock   Critical care was time spent personally by me on the following activities:  Development of treatment plan with patient or surrogate, discussions with consultants, evaluation of patient's response to treatment, examination of patient, ordering and review of laboratory studies, ordering and review of radiographic studies, ordering and performing treatments and interventions, pulse oximetry, re-evaluation of patient's condition, review of old charts and vascular access procedures Ultrasound ED Peripheral IV (Provider)  Date/Time: 07/18/2021 11:44 PM  Performed by: Sherwood Gambler, MD Authorized by: Sherwood Gambler, MD   Procedure details:    Indications: multiple failed IV attempts and poor IV access     Skin Prep: chlorhexidine gluconate     Location: left IJ.   Angiocath:  18 G   Bedside Ultrasound Guided: Yes     Patient tolerated procedure without complications: Yes     Dressing applied: Yes   Procedure Name: Intubation Date/Time: 07/18/2021 11:49 PM  Performed by: Sherwood Gambler, MDPre-anesthesia Checklist: Patient identified,  Patient being monitored, Emergency Drugs available, Timeout performed and Suction available Oxygen Delivery Method: Non-rebreather mask Preoxygenation: Pre-oxygenation with 100% oxygen Induction Type: Rapid sequence Ventilation: Mask ventilation  without difficulty Laryngoscope Size: Glidescope and 3 Grade View: Grade III Tube size: 7.5 mm Airway Equipment and Method: Video-laryngoscopy Placement Confirmation: ETT inserted through vocal cords under direct vision, CO2 detector and Breath sounds checked- equal and bilateral Secured at: 23 cm Tube secured with: ETT holder Dental Injury: Teeth and Oropharynx as per pre-operative assessment  Difficulty Due To: Difficulty was anticipated        Medications Ordered in ED Medications  0.9 %  sodium chloride infusion (has no administration in time range)  vancomycin (VANCOCIN) IVPB 1000 mg/200 mL premix (has no administration in time range)  ceFEPIme (MAXIPIME) 2 g in sodium chloride 0.9 % 100 mL IVPB (has no administration in time range)  lactated ringers bolus 500 mL (has no administration in time range)  midazolam (VERSED) injection 2 mg (has no administration in time range)  midazolam (VERSED) injection 2 mg (has no administration in time range)  fentaNYL (SUBLIMAZE) injection 50 mcg (has no administration in time range)  fentaNYL (SUBLIMAZE) injection 50-200 mcg (has no administration in time range)  etomidate (AMIDATE) injection (20 mg Intravenous Given 07/18/21 1741)  rocuronium (ZEMURON) injection (80 mg Intravenous Given 07/18/21 1803)  docusate (COLACE) 50 MG/5ML liquid 100 mg (has no administration in time range)  polyethylene glycol (MIRALAX / GLYCOLAX) packet 17 g (has no administration in time range)  heparin injection 5,000 Units (5,000 Units Subcutaneous Given 07/18/21 2133)  pantoprazole sodium (PROTONIX) 40 mg/20 mL oral suspension 40 mg (40 mg Per Tube Not Given 07/18/21 2134)  pantoprazole sodium (PROTONIX) 40 mg/20 mL oral suspension 40 mg (40 mg Per Tube Given 07/18/21 2113)  insulin aspart (novoLOG) injection 0-9 Units (1 Units Subcutaneous Given 07/18/21 2134)  levETIRAcetam (KEPPRA) 100 MG/ML solution 1,200 mg (1,200 mg Per Tube Given 07/18/21 2133)   ipratropium-albuterol (DUONEB) 0.5-2.5 (3) MG/3ML nebulizer solution 3 mL (has no administration in time range)  Chlorhexidine Gluconate Cloth 2 % PADS 6 each (6 each Topical Given 07/18/21 2112)  norepinephrine (LEVOPHED) 27m in 2560m(0.016 mg/mL) premix infusion (has no administration in time range)  sodium chloride 0.9 % bolus 1,000 mL (0 mLs Intravenous Stopped 07/18/21 1809)  atropine 1 MG/10ML injection 1 mg (1 mg Intravenous Given 07/18/21 1706)  calcium chloride injection 1 g (1 g Intravenous Given 07/18/21 1708)  ceFEPIme (MAXIPIME) 2 g in sodium chloride 0.9 % 100 mL IVPB (0 g Intravenous Stopped 07/18/21 1905)  metroNIDAZOLE (FLAGYL) IVPB 500 mg (0 mg Intravenous Stopped 07/18/21 1937)  vancomycin (VANCOCIN) IVPB 1000 mg/200 mL premix (0 mg Intravenous Stopped 07/18/21 2009)  lactated ringers bolus 1,000 mL (0 mLs Intravenous Stopped 07/18/21 1835)    ED Course/ Medical Decision Making/ A&P                           Medical Decision Making Amount and/or Complexity of Data Reviewed Labs: ordered. Radiology: ordered.  Risk Prescription drug management. Decision regarding hospitalization.   Patient is critically ill on arrival.  Poor IV access with 1 IV in his foot.  He is currently being paced.  Seems to have good capture on the pace but then his blood pressure has started to bottom out and he was put on Levophed.  I did place a quick ultrasound 18-gauge IV in his left IJ as there  was not of time to set up for a full central line and he needed access immediately.  This will probably need to be changed out in the ICU.  Otherwise due to other critically ill patients he did not have time for a central line in the emergency department.  However he was started on pressors, broad antibiotics, and given 30 cc/KG IV fluids.  Atropine seem to have no effect.  I suspect that bradycardia is related to the severe hypothermia, which is at least probably related to sepsis but I will also throw on  TSH and T4 testing.  Discussed with wife and updated on his critical illness.  She does indicate that he is a full code.  He was intubated based on first VBG results which showed significant hypercapnia and with his worsening mental status and secretions in his airway I do not feel like he is protecting his airway.  Discussed with Dr. Tamala Julian who will admit to the ICU.        Final Clinical Impression(s) / ED Diagnoses Final diagnoses:  Septic shock ([place])  Acute respiratory failure, unspecified whether with hypoxia or hypercapnia ([facility])  Acute kidney injury ([place])  Pneumonia of left lower lobe due to infectious organism  Hypothermia of newborn    Rx / DC Orders ED Discharge Orders     None         Sherwood Gambler, MD 07/18/21 2349    Sherwood Gambler, MD 07/18/21 2353

## 2021-07-18 NOTE — ED Triage Notes (Signed)
Pt bib from home where he has a home health nurse and only replies to questions with blinking his eyes. The nurse called EMS for low urine output 29m in the last 48 hours. Upon ems arrival, pt found to have heart rate of 30. EMS began pacing the patient at 720  EMS initial bp 80/50

## 2021-07-19 ENCOUNTER — Inpatient Hospital Stay (HOSPITAL_COMMUNITY): Payer: Medicare HMO

## 2021-07-19 DIAGNOSIS — G928 Other toxic encephalopathy: Secondary | ICD-10-CM

## 2021-07-19 DIAGNOSIS — R569 Unspecified convulsions: Secondary | ICD-10-CM | POA: Diagnosis not present

## 2021-07-19 DIAGNOSIS — R579 Shock, unspecified: Secondary | ICD-10-CM | POA: Diagnosis not present

## 2021-07-19 LAB — GLUCOSE, CAPILLARY
Glucose-Capillary: 103 mg/dL — ABNORMAL HIGH (ref 70–99)
Glucose-Capillary: 112 mg/dL — ABNORMAL HIGH (ref 70–99)
Glucose-Capillary: 122 mg/dL — ABNORMAL HIGH (ref 70–99)
Glucose-Capillary: 155 mg/dL — ABNORMAL HIGH (ref 70–99)
Glucose-Capillary: 60 mg/dL — ABNORMAL LOW (ref 70–99)
Glucose-Capillary: 85 mg/dL (ref 70–99)
Glucose-Capillary: 95 mg/dL (ref 70–99)

## 2021-07-19 LAB — POCT I-STAT 7, (LYTES, BLD GAS, ICA,H+H)
Acid-base deficit: 1 mmol/L (ref 0.0–2.0)
Acid-base deficit: 4 mmol/L — ABNORMAL HIGH (ref 0.0–2.0)
Bicarbonate: 23.5 mmol/L (ref 20.0–28.0)
Bicarbonate: 21.1 mmol/L (ref 20.0–28.0)
Calcium, Ion: 1.32 mmol/L (ref 1.15–1.40)
Calcium, Ion: 1.29 mmol/L (ref 1.15–1.40)
HCT: 36 % — ABNORMAL LOW (ref 39.0–52.0)
HCT: 30 % — ABNORMAL LOW (ref 39.0–52.0)
Hemoglobin: 12.2 g/dL — ABNORMAL LOW (ref 13.0–17.0)
Hemoglobin: 10.2 g/dL — ABNORMAL LOW (ref 13.0–17.0)
O2 Saturation: 96 %
O2 Saturation: 96 %
Patient temperature: 34.5
Patient temperature: 35
Potassium: 3.8 mmol/L (ref 3.5–5.1)
Potassium: 3.9 mmol/L (ref 3.5–5.1)
Sodium: 139 mmol/L (ref 135–145)
Sodium: 138 mmol/L (ref 135–145)
TCO2: 25 mmol/L (ref 22–32)
TCO2: 22 mmol/L (ref 22–32)
pCO2 arterial: 32.1 mmHg (ref 32–48)
pCO2 arterial: 32.8 mmHg (ref 32–48)
pH, Arterial: 7.463 — ABNORMAL HIGH (ref 7.35–7.45)
pH, Arterial: 7.407 (ref 7.35–7.45)
pO2, Arterial: 66 mmHg — ABNORMAL LOW (ref 83–108)
pO2, Arterial: 73 mmHg — ABNORMAL LOW (ref 83–108)

## 2021-07-19 LAB — COMPREHENSIVE METABOLIC PANEL
ALT: 84 U/L — ABNORMAL HIGH (ref 0–44)
AST: 45 U/L — ABNORMAL HIGH (ref 15–41)
Albumin: 2.5 g/dL — ABNORMAL LOW (ref 3.5–5.0)
Alkaline Phosphatase: 146 U/L — ABNORMAL HIGH (ref 38–126)
Anion gap: 10 (ref 5–15)
BUN: 55 mg/dL — ABNORMAL HIGH (ref 6–20)
CO2: 21 mmol/L — ABNORMAL LOW (ref 22–32)
Calcium: 9.3 mg/dL (ref 8.9–10.3)
Chloride: 106 mmol/L (ref 98–111)
Creatinine, Ser: 0.95 mg/dL (ref 0.61–1.24)
GFR, Estimated: >60 mL/min (ref >60)
Glucose, Bld: 109 mg/dL — ABNORMAL HIGH (ref 70–99)
Potassium: 3.7 mmol/L (ref 3.5–5.1)
Sodium: 137 mmol/L (ref 135–145)
Total Bilirubin: 0.4 mg/dL (ref 0.3–1.2)
Total Protein: 6.6 g/dL (ref 6.5–8.1)

## 2021-07-19 LAB — CBC
HCT: 36 % — ABNORMAL LOW (ref 39.0–52.0)
Hemoglobin: 11.4 g/dL — ABNORMAL LOW (ref 13.0–17.0)
MCH: 26.8 pg (ref 26.0–34.0)
MCHC: 31.7 g/dL (ref 30.0–36.0)
MCV: 84.7 fL (ref 80.0–100.0)
Platelets: 191 10*3/uL (ref 150–400)
RBC: 4.25 MIL/uL (ref 4.22–5.81)
RDW: 21.7 % — ABNORMAL HIGH (ref 11.5–15.5)
WBC: 7.9 10*3/uL (ref 4.0–10.5)
nRBC: 1.5 % — ABNORMAL HIGH (ref 0.0–0.2)

## 2021-07-19 LAB — TSH: TSH: 3.218 u[IU]/mL (ref 0.350–4.500)

## 2021-07-19 LAB — LACTIC ACID, PLASMA
Lactic Acid, Venous: 2.1 mmol/L (ref 0.5–1.9)
Lactic Acid, Venous: 2 mmol/L (ref 0.5–1.9)

## 2021-07-19 LAB — TROPONIN I (HIGH SENSITIVITY): Troponin I (High Sensitivity): 26 ng/L — ABNORMAL HIGH (ref <18)

## 2021-07-19 LAB — PROCALCITONIN
Procalcitonin: 0.19 ng/mL
Procalcitonin: 0.79 ng/mL

## 2021-07-19 LAB — T4, FREE: Free T4: 0.71 ng/dL (ref 0.61–1.12)

## 2021-07-19 LAB — TYPE AND SCREEN
ABO/RH(D): A POS
Antibody Screen: NEGATIVE

## 2021-07-19 LAB — AMMONIA: Ammonia: 20 umol/L (ref 9–35)

## 2021-07-19 LAB — PHOSPHORUS: Phosphorus: 2.6 mg/dL (ref 2.5–4.6)

## 2021-07-19 LAB — CORTISOL-AM, BLOOD: Cortisol - AM: 17.2 ug/dL (ref 6.7–22.6)

## 2021-07-19 LAB — MAGNESIUM: Magnesium: 2.2 mg/dL (ref 1.7–2.4)

## 2021-07-19 LAB — VITAMIN D 25 HYDROXY (VIT D DEFICIENCY, FRACTURES): Vit D, 25-Hydroxy: 52.61 ng/mL (ref 30–100)

## 2021-07-19 LAB — HIV ANTIBODY (ROUTINE TESTING W REFLEX): HIV Screen 4th Generation wRfx: NONREACTIVE

## 2021-07-19 LAB — VITAMIN B12: Vitamin B-12: 3900 pg/mL — ABNORMAL HIGH (ref 180–914)

## 2021-07-19 LAB — MRSA NEXT GEN BY PCR, NASAL: MRSA by PCR Next Gen: NOT DETECTED

## 2021-07-19 MED ORDER — LORAZEPAM 2 MG/ML IJ SOLN
2.0000 mg | INTRAMUSCULAR | Status: AC
  Administered 2021-07-19: 2 mg via INTRAVENOUS
  Filled 2021-07-19: qty 1

## 2021-07-19 MED ORDER — HYDROCORTISONE SOD SUC (PF) 100 MG IJ SOLR
100.0000 mg | Freq: Two times a day (BID) | INTRAMUSCULAR | Status: DC
  Administered 2021-07-19 – 2021-07-21 (×6): 100 mg via INTRAVENOUS
  Filled 2021-07-19 (×7): qty 2

## 2021-07-19 MED ORDER — SODIUM CHLORIDE 0.9 % IV SOLN
3000.0000 mg | Freq: Once | INTRAVENOUS | Status: AC
  Administered 2021-07-19: 3000 mg via INTRAVENOUS
  Filled 2021-07-19: qty 30

## 2021-07-19 MED ORDER — DEXTROSE 50 % IV SOLN
INTRAVENOUS | Status: AC
  Administered 2021-07-19: 50 mL
  Filled 2021-07-19: qty 50

## 2021-07-19 MED ORDER — PROSOURCE TF PO LIQD
45.0000 mL | Freq: Four times a day (QID) | ORAL | Status: DC
  Administered 2021-07-19 – 2021-08-04 (×64): 45 mL
  Filled 2021-07-19 (×64): qty 45

## 2021-07-19 MED ORDER — SODIUM CHLORIDE 0.9 % IV BOLUS
1000.0000 mL | Freq: Once | INTRAVENOUS | Status: AC
  Administered 2021-07-19: 1000 mL via INTRAVENOUS

## 2021-07-19 MED ORDER — ATROPINE SULFATE 1 MG/10ML IJ SOSY
PREFILLED_SYRINGE | INTRAMUSCULAR | Status: AC
  Filled 2021-07-19: qty 10

## 2021-07-19 MED ORDER — VANCOMYCIN HCL 1250 MG/250ML IV SOLN
1250.0000 mg | Freq: Two times a day (BID) | INTRAVENOUS | Status: DC
Start: 2021-07-19 — End: 2021-07-19

## 2021-07-19 MED ORDER — VITAL 1.5 CAL PO LIQD
1000.0000 mL | ORAL | Status: DC
  Administered 2021-07-19 – 2021-07-24 (×4): 1000 mL

## 2021-07-19 MED ORDER — ORAL CARE MOUTH RINSE
15.0000 mL | OROMUCOSAL | Status: DC
  Administered 2021-07-19 – 2021-08-04 (×191): 15 mL via OROMUCOSAL

## 2021-07-19 MED ORDER — VASOPRESSIN 20 UNITS/100 ML INFUSION FOR SHOCK
0.0000 [IU]/min | INTRAVENOUS | Status: DC
  Filled 2021-07-19: qty 100

## 2021-07-19 MED ORDER — ORAL CARE MOUTH RINSE: 15.0000 mL | OROMUCOSAL | Status: DC | PRN

## 2021-07-19 MED ORDER — BUPROPION HCL 75 MG PO TABS
75.0000 mg | ORAL_TABLET | Freq: Three times a day (TID) | ORAL | Status: DC
  Filled 2021-07-19 (×2): qty 1

## 2021-07-19 MED ORDER — PIPERACILLIN-TAZOBACTAM 3.375 G IVPB
3.3750 g | Freq: Three times a day (TID) | INTRAVENOUS | Status: AC
  Administered 2021-07-19 – 2021-07-22 (×10): 3.375 g via INTRAVENOUS
  Filled 2021-07-19 (×9): qty 50

## 2021-07-19 MED ORDER — PHENYLEPHRINE CONCENTRATED 100MG/250ML (0.4 MG/ML) INFUSION SIMPLE
0.0000 ug/min | INTRAVENOUS | Status: DC
  Administered 2021-07-19: 10 ug/min via INTRAVENOUS
  Filled 2021-07-19: qty 250

## 2021-07-19 MED ORDER — LEVETIRACETAM IN NACL 1500 MG/100ML IV SOLN
1500.0000 mg | Freq: Two times a day (BID) | INTRAVENOUS | Status: DC
  Administered 2021-07-20 – 2021-07-30 (×22): 1500 mg via INTRAVENOUS
  Filled 2021-07-19 (×24): qty 100

## 2021-07-19 MED ORDER — NOREPINEPHRINE 16 MG/250ML-% IV SOLN
0.0000 ug/min | INTRAVENOUS | Status: DC
  Administered 2021-07-19: 40 ug/min via INTRAVENOUS
  Administered 2021-07-19: 32 ug/min via INTRAVENOUS
  Administered 2021-07-19: 42 ug/min via INTRAVENOUS
  Administered 2021-07-20: 20 ug/min via INTRAVENOUS
  Administered 2021-07-21: 8 ug/min via INTRAVENOUS
  Administered 2021-07-21: 2 ug/min via INTRAVENOUS
  Filled 2021-07-19 (×4): qty 250

## 2021-07-19 NOTE — Progress Notes (Signed)
eLink Physician-Brief Progress Note Patient Name: Terry Palmer DOB: 1975-09-02 MRN: [id number]   Date of Service  07/19/2021  HPI/Events of Note  Hypotension - BP = 89/61. Now almost on ceiling dose of Norepinephrine IV infusion.   eICU Interventions  Plan: Monitor CVP now and Q 4 hours. Increase ceiling on Norepinephrine IV infusion to 60 mcg/min. ABG STAT.     Intervention Category Major Interventions: Hypotension - evaluation and management  Rorik Vespa Eugene 07/19/2021, 4:40 AM

## 2021-07-19 NOTE — Consult Note (Addendum)
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 Healed pressure injury to sacrum. Patient known to our team from previous admissions. Anatomy altered due to contractures. Wound type:Pressure Pressure Injury POA: Yes Measurement: Right IT:  3cm x 3cm x 0.4cm with smooth pink, nongranulating wound bed. Moderate serous drainage.Intact periwound skin Left hip: 2cm x 2cm x 1.2cm with red, moist wound bed and contracted, puckered periwound Wound bed:As noted above Drainage (amount, consistency, odor) As noted above Periwound:As noted above Dressing procedure/placement/frequency:I have provided Nursing with guidance for the topical care of these lesions using once daily silver hydrofiber to the wounds and topping with dry gauze, securing with silicone foam. Turning and repositioning is in place as able due to contractures of the arms and LEs. 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  POC communicated to Bedside RN who assisted me with repositioning the patient.  [facility] nursing team will not follow, but will remain available to this patient, the nursing and medical teams.  Please re-consult if needed.  Thank you for inviting [place] to participate in this patient's Plan of Care.  Maudie Flakes, MSN, RN, CNS, [place], Serita Grammes, [employer], [employer] phone:  [phone]

## 2021-07-19 NOTE — Progress Notes (Signed)
EEG completed, results pending. 

## 2021-07-19 NOTE — Progress Notes (Signed)
NAME:  Terry Palmer, MRN:  [id number], DOB:  03-Jul-1975, LOS: 1 ADMISSION DATE:  07/18/2021, CONSULTATION DATE:  07/18/2021 REFERRING MD:  Dr. Regenia Skeeter, CHIEF COMPLAINT:  AMS, bradycardia   History of Present Illness:  46 y/o M who presented to [facility] ER on 7/11 with reports of low UOP.     At baseline, he lives at home with his wife and has a home Doctor, general practice.  He has MS and is bed bound / quad since at least 2013 with PEG in place.  [name] home health RN noted decreased UOP of <200 ml in 48h from his suprapubic catheter.  Almost no UOP on day of presentation. The RN changed the catheter with no change in output. At baseline, he is bed bound and blinks yes at baseline. He has been less alert. Family also reported a "rattling in his chest" for a few days. EMS was activated and patient was found to have a HR of 30.  Pacing was initiated by EMS at a rate of 70.  Initial BP 80/50. On arrival, the patient was activated as a possible sepsis. He was treated with 2.5L IVF. He was hypothermic and bair hugger was placed. The patient was noted to have a 3x3cm wound on the right and a 3x3 wound on the left hip. Per family, his wounds are actually improved.  His temperature on arrival was 82 degrees.  He was intubated in the ER per EDP. Initial labs - Na 131, K 4.6, Cl 97, CO2 26, glucose 95, BUN 64, cr 1.14, alk phos 141, albumin 2.6, AST 51, ALT 77, WBC 3.7, Hgb 9.9, and platelets 113.  ETT in good position on CXR, ? Basilar infiltrates on left.    PCCM called for ICU admission.   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 He has no gag reflex and BP is very sensitive, dropping with minimal movement.  Objective   Blood pressure (!) 89/59, pulse 99, temperature (!) 95.5 F (35.3 C), resp. rate 18, height 6' 4"  (1.93 m), weight 69.9 kg, SpO2 96 %. CVP:  [5 mmHg] 5 mmHg  Vent Mode: PRVC FiO2 (%):  [40 %-60 %] 40 % Set Rate:  [18 bmp] 18 bmp Vt Set:  [500 mL] 500 mL PEEP:  [5 cmH20] 5 cmH20 Plateau Pressure:  [16 cmH20-21 cmH20] 16 cmH20   Intake/Output Summary (Last 24 hours) at 07/19/2021 0750 Last data filed at 07/19/2021 0600 Gross per 24 hour  Intake 1849.48 ml  Output 2500 ml  Net -650.52 ml   Filed Weights   07/18/21 2002 07/19/21 0457  Weight: 69.9 kg 69.9 kg    Examination: General: Chronically ill appearing gentleman. No acute distress. HENT: ETT in place. Lungs: Clear to bilateral auscultation anteriorly. Cardiovascular: Regular rate and rhythm. No murmurs, rubs, gallops. Abdomen: Soft, nontender, nondistended. Extremities: Bilateral LE are contracted, without distal edema. Neuro: Unconscious. No gag reflex.  Resolved Hospital Problem list     Assessment & Plan:  Acute respiratory failure w/ hypercarbia Possible LLL pneumonia Repeat CXR this morning showed  cardiomegaly and mild pulmonary vascular congestion, improving L perihilar opacity, and persistent bibasilar opacities. He does not appear volume overloaded however with his hypotension he would not tolerate any type of diuresis. -Continue full vent support, LTVV strategy with tidal volumes of 6-8 cc/kg ideal body weight, goal plateau pressures less than 30 and driving pressures less than 15 -Wean PEEP/FiO2 for SpO2 >90% -VAP bundle in place -PAD protocol in place -Wean sedation for RASS goal 0 to -1 -Follow intermittent CXR and ABG PRN   Acute Metabolic Encephalopathy Hx of multiple sclerosis Quadriplegic TSH, ammonia are WNL. Patient has no gag reflex this  morning and is not responsive though he is not on any sedation. Due to concern for acute intracranial process, CT head was obtained which showed severe, markedly advanced for age generalized cerebral atrophy with no evidence of superimposed acute intracranial abnormality. -Hold home baclofen, dantrolene, tizanidine   Hypothermia Sepsis Bradycardia Persistently hypothermic overnight, temperature now up to 94.1. Bradycardia has resolved. He received 1L IVF with persistent hypotension at 40 mcg levophed, requiring increase of levophed ceiling to 60 mcg/min. STAT ABG obtained showed 7.463/32.1/66/23.5. He received an additional IVF bolus, 1L, and vent settings were adjusted based on ABG results. Currently struggling to maintain MAP>65 on levophed 60 mcg. Antibiotics include vancomycin, cefepime, metronidazole. Procalcitonin 0.79, LA stable at 2.0, no leukocytosis. Blood culture unable to be obtained, urine culture and sputum cultures pending at this time. He does have a sacral wound that is reportedly improved from prior and not thought thus far to be driving his presentation. -Give additional 1L IVF -Levophed, phenylephrine, vasopressin titrated as needed for MAP>65 -If no improvement by evening 07/13 despite antibiotics, consider sacral MRI to assess for osteomyelitis -Tele monitoring -Continue pacing -Continue bair hugger -Continue cefepime, d/c vancomycin -F/u urine, sputum cultures -Trend procalcitonin   Type 2 diabetes mellitus A1c 5.1%. -CBG, SSI   AKI Neurogenic bladder AKI resolved, serum creatinine 0.95 (1.30 07/11). Electrolytes are WNL. -Trend BMP, replete electrolytes as indicated -Monitor UOP -Avoid nephrotoxic agents, ensure adequate renal perfusion   Pancytopenia Resolved with exception of hemoglobin which is stable at 11.4  -Trend CBC, transfuse for Hgb <7   Mildly elevated LFTs Stable. -Trend CMP   Mild hyponatremia Resolved. -Trend BMP   Hx of seizure  disorder Stable. -Continue keppra   Hx of HTN On pressor support at this time. -Hold clonidine   Sacral Wound POA Is reportedly improved from prior. No imaging since CT abdomen/pelvis in August 2022, at which time there was concern for osteomyelitis. -MRI evening 07/13 if no clinical improvement despite current efforts to rule out osteomyelitis; if osteomyelitis is present, will need to consult palliative -Wound care -Offload as able   Hx of depression Given encephalopathy, currently holding home regimen. -Continue to hold home xanax, valium, wellbutrin for now  Best Practice (right click and "Reselect all SmartList Selections" daily)   Diet/type: NPO DVT prophylaxis: prophylactic heparin  GI prophylaxis: PPI Lines: Central line Foley:  N/A Code Status:  full code Last date of multidisciplinary goals of care discussion [07/11]  Labs   CBC: Recent Labs  Lab 07/18/21 1708 07/18/21 1730 07/18/21 1837 07/19/21 0348 07/19/21 0515  WBC 3.7*  --   --  7.9  --   NEUTROABS 3.1  --   --   --   --   HGB 9.9* 10.9* 11.6* 11.4* 12.2*  HCT 33.9* 32.0* 34.0* 36.0* 36.0*  MCV 90.9  --   --  84.7  --  PLT 113*  --   --  191  --     Basic Metabolic Panel: Recent Labs  Lab 07/18/21 1708 07/18/21 1730 07/18/21 1837 07/19/21 0348 07/19/21 0515  NA 131* 132* 135 137 139  K 4.6 4.6 4.2 3.7 3.8  CL 97*  --   --  106  --   CO2 26  --   --  21*  --   GLUCOSE 95  --   --  109*  --   BUN 64*  --   --  55*  --   CREATININE 1.14 1.30*  --  0.95  --   CALCIUM 8.9  --   --  9.3  --   MG  --   --   --  2.2  --   PHOS  --   --   --  2.6  --    GFR: Estimated Creatinine Clearance: 95 mL/min (by C-G formula based on SCr of 0.95 mg/dL). Recent Labs  Lab 07/18/21 1708 07/18/21 2311 07/19/21 0348  PROCALCITON  --  0.19 0.79  WBC 3.7*  --  7.9  LATICACIDVEN 1.0 2.1* 2.0*    Liver Function Tests: Recent Labs  Lab 07/18/21 1708 07/19/21 0348  AST 51* 45*  ALT 77* 84*   ALKPHOS 141* 146*  BILITOT 0.2* 0.4  PROT 6.3* 6.6  ALBUMIN 2.6* 2.5*   No results for input(s): "LIPASE", "AMYLASE" in the last 168 hours. Recent Labs  Lab 07/18/21 2311  AMMONIA 20    ABG    Component Value Date/Time   PHART 7.463 (H) 07/19/2021 0515   PCO2ART 32.1 07/19/2021 0515   PO2ART 66 (L) 07/19/2021 0515   HCO3 23.5 07/19/2021 0515   TCO2 25 07/19/2021 0515   ACIDBASEDEF 1.0 07/19/2021 0515   O2SAT 96 07/19/2021 0515     Coagulation Profile: Recent Labs  Lab 07/18/21 1708  INR 1.1    Cardiac Enzymes: No results for input(s): "CKTOTAL", "CKMB", "CKMBINDEX", "TROPONINI" in the last 168 hours.  HbA1C: Hgb A1c MFr Bld  Date/Time Value Ref Range Status  07/18/2021 11:11 PM 5.1 4.8 - 5.6 % Final    Comment:    (NOTE) Pre diabetes:          5.7%-6.4%  Diabetes:              >6.4%  Glycemic control for   <7.0% adults with diabetes   08/10/2020 05:42 PM 7.1 (H) 4.8 - 5.6 % Final    Comment:    (NOTE) Pre diabetes:          5.7%-6.4%  Diabetes:              >6.4%  Glycemic control for   <7.0% adults with diabetes     CBG: Recent Labs  Lab 07/18/21 1656 07/18/21 2002 07/19/21 0000 07/19/21 0321  GLUCAP 93 148* 155* 122*    Review of Systems:   Unable to obtain due to encephalopathy.  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   Surgical History:   Past Surgical History:  Procedure Laterality Date   GASTROSTOMY  04/16/2011   Procedure:  GASTROSTOMY;  Surgeon: Lavone Neri  Ellison Carwin, MD;  Location: [facility];  Service: General;  Laterality: N/A;  Open G-Tube placement   LUMBAR PUNCTURE  10/12/2002   SUPRAPUBIC CYSTOSTOMY       Social History:   reports that he quit smoking about 22 years ago. His smoking use included cigarettes. He has quit using smokeless tobacco. He reports that he does not drink alcohol.   Family History:  His family history includes Asthma in his mother.   Allergies No Known Allergies   Home Medications  Prior to Admission medications   Medication Sig Start Date End Date Taking? Authorizing Provider  Accu-Chek Softclix Lancets lancets use as directed to check blood sugar up to 4 times daily 06/02/20   Mariel Aloe, MD  acetaminophen (TYLENOL) 500 MG tablet Take 1 tablet (500 mg total) by mouth every 8 (eight) hours as needed for fever (or pain). Patient taking differently: Take 500 mg by mouth 2 (two) times daily. 03/05/15   Debbe Odea, MD  ALPRAZolam Duanne Moron) 0.25 MG tablet From 5/26 to 5/30 Give 0.67m per tube two times daily; then from 5/31 to 6/6 decrease to 0.210mper tube at bedtime. Then on 6/7 to 6/11, decrease to 0.2553mer tube at bedtime every OTHER day. Then STOP 06/02/20   NetMariel AloeD  amoxicillin-clavulanate (AUGMENTIN) 875-125 MG tablet Place 1 tablet into feeding tube 2 (two) times daily. 06/12/21   Reed, Tiffany L, DO  AZO-CRANBERRY PO Take 1 tablet by mouth 2 (two) times daily.    [provider]  baclofen (LIORESAL) 20 MG tablet Place 1 tablet (20 mg total) into feeding tube 4 (four) times daily. 06/12/21   Reed, Tiffany L, DO  bisacodyl (DULCOLAX) 10 MG suppository Place 10 mg rectally once as needed for moderate constipation.    [provider]  Blood Glucose Monitoring Suppl (ACCU-CHEK GUIDE) w/Device KIT 1 each by Does not apply route as needed. 06/02/20   NetMariel AloeD  buPROPion (WELLBUTRIN) 75 MG tablet Place 1 tablet (75 mg total) into feeding tube 3 (three)  times daily. 01/24/13   KriBonnielee HaffD  cloNIDine (CATAPRES) 0.1 MG tablet Place 1 tablet (0.1 mg total) into feeding tube 3 (three) times daily. 06/05/20 09/03/20  NetMariel AloeD  dantrolene (DANTRIUM) 50 MG capsule Give 50 mg by tube 4 (four) times daily. At 0800, 1200, 1600, 2000    [provider]  diazepam (VALIUM) 5 MG tablet Place 1 tablet (5 mg total) into feeding tube at bedtime as needed for anxiety (sleep). Hold until completion of Ativan 06/05/20   NetMariel AloeD  famotidine (PEPCID) 40 MG/5ML suspension Take 20 mg by mouth daily. 04/28/20   [provider]  glucose blood (ACCU-CHEK GUIDE) test strip Use to check blood sugar up to four times daily 06/02/20   NetMariel AloeD  guaifenesin (ROBITUSSIN) 100 MG/5ML syrup Take 200 mg by mouth 3 (three) times daily as needed for cough.    [provider]  HYDROcodone-acetaminophen (NORCO) 7.5-325 MG per tablet Place 1 tablet into feeding tube every 6 (six) hours as needed (pain).     [provider]  insulin detemir (LEVEMIR) 100 UNIT/ML injection Inject 0.22 mLs (22 Units total) into the skin daily. 06/06/20 09/04/20  NetMariel AloeD  Insulin Syringe-Needle U-100 (INSULIN SYRINGE .3CC/31GX5/16") 31G X 5/16" 0.3 ML MISC Use with Levemir prescription. 06/05/20   NetMariel AloeD  ipratropium-albuterol (DUONEB) 0.5-2.5 (3) MG/3ML SOLN Take 3  mLs by nebulization See admin instructions. Four times daily and as needed for shortness of breath 06/12/21   Reed, Tiffany L, DO  levETIRAcetam (KEPPRA) 100 MG/ML solution Take 1,200 mg by mouth in the morning, at noon, and at bedtime. 12 ml    [provider]  Multiple Vitamin (MULTIVITAMIN WITH MINERALS) TABS tablet Place 1 tablet into feeding tube daily. 01/24/13   Bonnielee Haff, MD  omega-3 acid ethyl esters (LOVAZA) 1 g capsule 1 capsule daily. tube 06/13/20   [provider]  polyethylene glycol (MIRALAX / GLYCOLAX) packet Place 17 g into  feeding tube daily. Patient taking differently: Place 17 g into feeding tube every evening. 01/24/13   Bonnielee Haff, MD  polyvinyl alcohol (LIQUIFILM TEARS) 1.4 % ophthalmic solution Place 1 drop into both eyes 3 (three) times daily. Patient taking differently: Place 1 drop into both eyes 2 (two) times daily. 01/24/13   Bonnielee Haff, MD  tiZANidine (ZANAFLEX) 2 MG tablet Take 1 tablet (2 mg total) by mouth every 6 (six) hours as needed for muscle spasms. Patient taking differently: Place 2 mg into feeding tube 3 (three) times daily. 11/25/15   Kathrynn Ducking, MD  vitamin C (ASCORBIC ACID) 500 MG tablet Place 500 mg into feeding tube daily.     [provider]  Water For Irrigation, Sterile (FREE WATER) SOLN Place 240 mLs into feeding tube every 4 (four) hours. 01/24/13   Bonnielee Haff, MD     Critical care time: [place], DO Internal Medicine PGY-2

## 2021-07-19 NOTE — Consult Note (Signed)
Neurology Consultation  Reason for Consult: Altered mental status, abnormal EEG Referring Physician: Dr. Tamala Julian, CCM  CC: Altered mental status, abnormal EEG  History is obtained from: Chart review  HPI: Terry Palmer is a 46 y.o. 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  Per chart review he lives at home and has home health RN-has had MS for a while and family takes care of him at home with the assistance of the home and RN.  He has had decreased output of less than 200 cc in the 48 hours preceding the follow-up.  Per chart review, he blinks yes and no at baseline and that is his only mode of communication.  He has been less alert than usual.  Family also reports a rattling sound in his chest for the past few days.  EMS was called.  They noted the patient to have a heart rate of 30.  Pacing was initiated, heart rate up to 78 with initial BP 80/50.  On arrival he was treated as a possible sepsis-treated with aggressive fluid hydration.  He was hypothermic and Bair hugger was placed.  Noted to have a 3 x 3 cm wound on the right and a 3 x 3 cm on the left hip.  Per family these wounds are actually improved.  His temperature on arrival was 82 degrees.  He was intubated in the ER.  Initial labs sodium 131 potassium 4.6 chloride 97 carbon oxide 26 glucose 95 BUN 64 creatinine 1.14 alk phos 141 albumin 2.6 AST 51 ALT 77 WBC 3.7 along with CBC showing hemoglobin of 9.9 and platelet count of 113. Question basilar infiltrates on the left on chest x-ray.  Admitted to critical care and not being on sedation remains unarousable for which an EEG was obtained.  EEG revealed periodic discharges with  overriding fast activity in the left hemisphere along with continuous slowing which is generalized and lateralized to the left hemisphere as well.  This study was highly suggestive of epileptogenicity and cortical dysfunction from the left hemisphere with increased risk of seizure recurrence.   ROS: Full ROS was performed and is negative except as noted in the HPI.  Unable to obtain due to altered mental status.   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 His smoking use included cigarettes. He has quit using smokeless tobacco. He reports that he does not drink alcohol. No history on file for drug use.  Medications  Current Facility-Administered Medications:  0.9 %  sodium chloride infusion, 250 mL, Intravenous, Continuous, Sherwood Gambler, MD, Stopped at 07/19/21 0650   Chlorhexidine Gluconate Cloth 2 % PADS 6 each, 6 each, Topical, Q0600, Candee Furbish, MD, 6 each at 07/18/21 2112   docusate (COLACE) 50 MG/5ML liquid 100 mg, 100 mg, Per Tube, BID PRN, Candee Furbish, MD   etomidate (AMIDATE) injection, , Intravenous, PRN, Sherwood Gambler, MD, 20 mg at 07/18/21 1741   feeding supplement (PROSource TF) liquid 45 mL, 45 mL, Per Tube, QID, Candee Furbish, MD, 45 mL at 07/19/21 1337   feeding supplement (VITAL 1.5 CAL) liquid 1,000  mL, 1,000 mL, Per Tube, Continuous, Candee Furbish, MD   heparin injection 5,000 Units, 5,000 Units, Subcutaneous, Q8H, Candee Furbish, MD, 5,000 Units at 07/19/21 1322   hydrocortisone sodium succinate (SOLU-CORTEF) 100 MG injection 100 mg, 100 mg, Intravenous, Q12H, Candee Furbish, MD, 100 mg at 07/19/21 0855   insulin aspart (novoLOG) injection 0-9 Units, 0-9 Units, Subcutaneous, Q4H, Gleason, Otilio Carpen, PA-C, 1 Units at 07/19/21 0346   ipratropium-albuterol (DUONEB) 0.5-2.5 (3) MG/3ML nebulizer solution 3 mL, 3 mL, Nebulization, Q4H PRN, Rollene Rotunda, John D, PA-C   lactated ringers bolus 500 mL, 500 mL, Intravenous, Once, Sherwood Gambler, MD   levETIRAcetam (KEPPRA) 100 MG/ML solution 1,200 mg, 1,200 mg, Per Tube, BID, Andres Labrum D, PA-C, 1,200 mg at 07/19/21 1003   norepinephrine (LEVOPHED) 16 mg in 251m premix infusion, 0-60 mcg/min, Intravenous, Titrated, SAnders Simmonds MD, Last Rate: 51.6 mL/hr at 07/19/21 1200, 55 mcg/min at 07/19/21 1200   Oral care mouth rinse, 15 mL, Mouth Rinse, Q2H, SCandee Furbish MD, 15 mL at 07/19/21 1415   Oral care mouth rinse, 15 mL, Mouth Rinse, PRN, SCandee Furbish MD   pantoprazole sodium (PROTONIX) 40 mg/20 mL oral suspension 40 mg, 40 mg, Per Tube, Daily, Gleason, LOtilio Carpen PA-C, 40 mg at 07/19/21 1002   phenylephrine CONCENTRATED 1053min sodium chloride 0.9% 25048m0.4mg51m) premix infusion, 0-400 mcg/min, Intravenous, Titrated, DeanFarrel Gordon, Stopped at 07/19/21 1002   piperacillin-tazobactam (ZOSYN) IVPB 3.375 g, 3.375 g, Intravenous, Q8H, Jardin, Carla G, RPH   polyethylene glycol (MIRALAX / GLYCOLAX) packet 17 g, 17 g, Per Tube, Daily PRN, SmitCandee Furbish   vasopressin (PITRESSIN) 20 Units in sodium chloride 0.9 % 100 mL infusion-*FOR SHOCK*, 0-0.04 Units/min, Intravenous, Continuous, DeanFarrel Gordon   Exam: Current vital signs: BP 113/64 (BP Location: Left Arm)   Pulse (!) 102   Temp 98.8 F (37.1 C) (Esophageal)   Resp 18   Ht 6' 4"   (1.93 m)   Wt 69.9 kg   SpO2 97%   BMI 18.76 kg/m  Vital signs in last 24 hours: Temp:  [82 F (27.8 C)-98.8 F (37.1 C)] 98.8 F (37.1 C) (07/12 1200) Pulse Rate:  [73-104] 102 (07/12 1200) Resp:  [16-61] 18 (07/12 1200) BP: (59-140)/(10-100) 113/64 (07/12 1200) SpO2:  [85 %-100 %] 97 % (07/12 1200) FiO2 (%):  [40 %-60 %] 40 % (07/12 1200) Weight:  [69.9 kg] 69.9 kg (07/12 0457) General: Intubated on no sedation HEENT: Normocephalic atraumatic Lungs: Vented Cardiovascular: Regular rate rhythm Abdomen nondistended nontender Neurological exam Intubated on no sedation No spontaneous movement To noxious stimulation, no response Cranial nerves: Pupils are equal sluggishly reactive to light, present corneals, breathing with the ventilator. Motor examination with contractures noted in all 4 extremities with reduction of muscle mass.  No withdrawal to noxious stimulation. Sensory examination: As above  Labs I have reviewed labs in epic and the results pertinent to this consultation are:  CBC    Component Value Date/Time   WBC 7.9 07/19/2021 0348   RBC 4.25 07/19/2021 0348   HGB 10.2 (L) 07/19/2021 0820   HCT 30.0 (L) 07/19/2021 0820   PLT 191 07/19/2021 0348   MCV 84.7 07/19/2021 0348   MCH 26.8 07/19/2021 0348   MCHC 31.7 07/19/2021 0348   RDW 21.7 (H) 07/19/2021 0348   LYMPHSABS 0.5 (L) 07/18/2021 1708   MONOABS 0.1 07/18/2021 1708   EOSABS 0.0 07/18/2021 1708   BASOSABS 0.0 07/18/2021 1708    CMP     Component Value Date/Time   NA 138 07/19/2021 0820   K 3.9 07/19/2021 0820   CL 106 07/19/2021 0348   CO2 21 (L) 07/19/2021 0348   GLUCOSE 109 (H) 07/19/2021 0348   BUN 55 (H) 07/19/2021 0348   CREATININE 0.95 07/19/2021 0348   CALCIUM 9.3 07/19/2021 0348   PROT 6.6 07/19/2021 0348   ALBUMIN 2.5 (L) 07/19/2021 0348   AST 45 (H) 07/19/2021 0348   ALT 84 (H) 07/19/2021 0348   ALKPHOS 146 (H) 07/19/2021 0348   BILITOT 0.4 07/19/2021 0348   GFRNONAA >60  07/19/2021 0348   GFRAA >60 12/10/2017 0442    Lipid Panel     Component Value Date/Time   CHOL  08/21/2009 0706    151        ATP III CLASSIFICATION:  <200     mg/dL   Desirable  200-239  mg/dL   Borderline High  >=240    mg/dL   High          TRIG 57 08/21/2009 0706   HDL 49 08/21/2009 0706   CHOLHDL 3.1 08/21/2009 0706   VLDL 11 08/21/2009 0706   LDLCALC  08/21/2009 0706    91        Total Cholesterol/HDL:CHD Risk Coronary Heart Disease Risk Table                     Men   Women  1/2 Average Risk   3.4   3.3  Average Risk       5.0   4.4  2 X Average Risk   9.6   7.1  3 X Average Risk  23.4   11.0        Use the calculated Patient Ratio above and the CHD Risk Table to determine the patient's CHD Risk.        ATP III CLASSIFICATION (LDL):  <100     mg/dL   Optimal  100-129  mg/dL   Near or Above                    Optimal  130-159  mg/dL   Borderline  160-189  mg/dL   High  >190     mg/dL   Very High     Imaging I have reviewed the images obtained: CT-head: Extremely advanced atrophy for age.    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 Clinical picture concerning for sepsis.  Given poor baseline brain reserve, systemic illness is likely causing this profound encephalopathy.  That  said, EEG also revealed consideration for epileptogenicity arising from the left hemisphere. 2014 to watch him on EEG and increase his antiepileptics to see if that affects his mentation. Given his poor baseline and extremely devastating neurological and neuroimaging pictures as above, I suspect that any recovery that he has given the severe sepsis  presentation, will be extremely protracted.  Impression: Multifactorial toxic metabolic encephalopathy due to possible sepsis  Recommendations: -Discontinue cefepime and use alternative so as to not confound EEG (cefepime has CNS toxicity) -LTM EEG -1 dose of benzo with some improvement in the EEG pattern. -Keppra load 3 g followed by Keppra 1500 twice daily-home dose was 1200 twice daily which she was getting through tube-change all medications to IV formulation for now. -Continue LTM -Maintain seizure precautions -Correction of metabolic derangements as you are. -Prognosis remains guarded.  Discussed with Dr. Tamala Julian. -- Amie Portland, MD Neurologist [facility] Pager: [phone]   CRITICAL CARE ATTESTATION Performed by: Amie Portland, MD Total critical care time: 41 minutes Critical care time was exclusive of separately billable procedures and treating other patients and/or supervising APPs/Residents/Students Critical care was necessary to treat or prevent imminent or life-threatening deterioration due to toxic metabolic encephalopathy This patient is critically ill and at significant risk for neurological worsening and/or death and care requires constant monitoring. Critical care was time spent personally by me on the following activities: development of treatment plan with patient and/or surrogate as well as nursing, discussions with consultants, evaluation of patient's response to treatment, examination of patient, obtaining history from patient or surrogate, ordering and performing treatments and interventions, ordering and review of laboratory studies, ordering and review of radiographic studies, pulse oximetry, re-evaluation of patient's condition, participation in multidisciplinary rounds and medical decision making of high complexity in the care of this patient.

## 2021-07-19 NOTE — Progress Notes (Signed)
eLink Physician-Brief Progress Note Patient Name: Terry Palmer DOB: 01-Dec-1975 MRN: [id number]   Date of Service  07/19/2021  HPI/Events of Note  Lactic Acid = 2.1. LVEF = 55%. Remains hypothermic at 90.7 F.  eICU Interventions  Plan: Bolus with 0.9 NaCl 1 liter IV over 1 hour now.      Intervention Category Major Interventions: Acid-Base disturbance - evaluation and management  Terry Palmer 07/19/2021, 1:41 AM

## 2021-07-19 NOTE — Progress Notes (Signed)
Patient transported to CT & back to room 3M11 on the ventilator with no problems.

## 2021-07-19 NOTE — Procedures (Signed)
Patient Name: Terry Palmer  MRN: [id number]  Epilepsy Attending: Lora Havens  Referring Physician/Provider: Candee Furbish, MD  Date: 07/19/2021 Duration: 21.51 mins  Patient history: 46yo m with h/o seizure, ams. EEG to evaluate for seizure.   Level of alertness:  lethargic   AEDs during EEG study: LEV  Technical aspects: This EEG study was done with scalp electrodes positioned according to the 10-20 International system of electrode placement. Electrical activity was acquired at a sampling rate of '500Hz'$  and reviewed with a high frequency filter of '70Hz'$  and a low frequency filter of '1Hz'$ . EEG data were recorded continuously and digitally stored.   Description: EEG showed 6 to 8 Hz theta-alpha activity in right hemisphere and low amplitude 2 to 3 Hz delta slowing in left hemisphere. Additionally there are qasi-periodic discharges with overriding fast activity in left hemisphere every 2 -9 seconds.  hyperventilation and photic stimulation were not performed.     ABNORMALITY -Periodic discharges with overriding fast activity, left hemisphere -Continuous slow, generalized and lateralized left hemisphere  IMPRESSION: This study showed evidence of epileptogenicity and cortical dysfunction arising from left hemisphere with increased risk of seizure recurrence.  Additionally there is moderate to severe diffuse encephalopathy, nonspecific etiology.  Dr. Tamala Julian and Dr. Rory Percy were notified.  Recommend LTM EEG monitoring.  Derrick Orris [name]

## 2021-07-19 NOTE — Progress Notes (Signed)
ABG obtained on ventilator settings of VT: 500, RR: 18, FIO2: 40%, and PEEP: 5.  Results given to MD.  No further changes at this time.    Latest Reference Range & Units 07/19/21 08:20  Sample type  ARTERIAL  pH, Arterial 7.35 - 7.45  7.407  pCO2 arterial 32 - 48 mmHg 32.8  pO2, Arterial 83 - 108 mmHg 73 (L)  TCO2 22 - 32 mmol/L 22  Acid-base deficit 0.0 - 2.0 mmol/L 4.0 (H)  Bicarbonate 20.0 - 28.0 mmol/L 21.1  O2 Saturation % 96  Patient temperature  35.0 C  Collection site  RADIAL, ALLEN'S TEST ACCEPTABLE

## 2021-07-19 NOTE — Progress Notes (Signed)
Pharmacy Antibiotic Note  Terry Palmer is a 46 y.o. male with MS who is quadriplegic and nonverbal admitted on 07/18/2021 with pneumonia. Patient previously had staph epidermidis bacteremia and proteus UTI in Aug 2022. Empirically started of cefepime and vancomycin. Pharmacy has been consulted for piperacillin-tazobactam (Zosyn) dosing.  7/12 EEG with evidence of epileptogenicity and cortical dysfunction.  Cefepime discontinued per neuro recs and transitioned to Zosyn MRSA PCR is negative - vancomycin discontinued  Plan: Start Zosyn 3.375g IV q8h extended infusion at 1800  Monitor cultures, clinical status, and renal function Narrow abx as able and f/u duration   Height: '6\' 4"'$  (193 cm) Weight: 69.9 kg (154 lb 1.6 oz) IBW/kg (Calculated) : 86.8  Temp (24hrs), Avg:90.2 F (32.3 C), Min:82 F (27.8 C), Max:98.8 F (37.1 C)  Recent Labs  Lab 07/18/21 1708 07/18/21 1730 07/18/21 2311 07/19/21 0348  WBC 3.7*  --   --  7.9  CREATININE 1.14 1.30*  --  0.95  LATICACIDVEN 1.0  --  2.1* 2.0*    Estimated Creatinine Clearance: 95 mL/min (by C-G formula based on SCr of 0.95 mg/dL).    No Known Allergies  Antimicrobials this admission: Vanc 7/11>> 7/12 Cefe 7/11 >> 7/12 MTZ 7/11 x1  Zosyn 7/12 >>   Dose adjustments this admission: N/a  Microbiology results: 7/11 BCx: pend 7/11 UCx: pend  08/11/20 MRSA neg   Thank you for allowing pharmacy to be a part of this patient's care.  Luisa Hart, PharmD, BCPS Clinical Pharmacist 07/19/2021 2:20 PM   Please refer to AMION for pharmacy phone number

## 2021-07-19 NOTE — Progress Notes (Signed)
[facility] Progress Note Patient Name: Terry Palmer DOB: 1975/05/23 MRN: [id number]   Date of Service  07/19/2021  HPI/Events of Note  Multiple issues: 1. Hypotension - CVP = 5. 2. ABG on 40%/PRVC 18/TV 500/P 5 = 7.463/32.1/66/23.5  eICU Interventions  Plan: Bolus with 0.9 NaCl 1 liter IV over 1 hour now. Decrease PRVC rate to 15. Repeat ABG at 8 AM. Titrate FiO2 to keep sat >= 92%.     Intervention Category Major Interventions: Hypotension - evaluation and management  Shondrea Steinert [place] 07/19/2021, 5:54 AM

## 2021-07-19 NOTE — Progress Notes (Addendum)
Initial Nutrition Assessment  DOCUMENTATION CODES:   Not applicable  INTERVENTION:   Initiate tube feeding via PEG: Vital 1.5 at 25 ml/h, increase by 10 ml every 4 hours to goal rate of 55 ml/h (1320 ml per day) Prosource TF 45 ml QID  Provides 2140 kcal, 133 gm protein, 1008 ml free water daily.  Juven 1 packet BID via tube, each packet provides 80 calories, 8 grams of carbohydrate, 2.5  grams of protein (collagen), 7 grams of L-arginine and 7 grams of L-glutamine; supplement contains CaHMB, Vitamins C, E, B12 and Zinc to promote wound healing.   MVI with minerals daily via tube.  Check vitamin B12, vitamin C, vitamin D, thiamin, zinc, and copper levels and replete as needed.   NUTRITION DIAGNOSIS:   Increased nutrient needs related to wound healing as evidenced by estimated needs.  GOAL:   Patient will meet greater than or equal to 90% of their needs  MONITOR:   Vent status, TF tolerance, Labs, Skin  REASON FOR ASSESSMENT:   Ventilator, Consult Enteral/tube feeding initiation and management  ASSESSMENT:   46 yo male admitted with AMS, bradycardia, low UOP. PMH includes multiple sclerosis, quadriparesis, bed bound, PEG, recurrent UTI, dysphagia, dementia, seizure disorder.  Discussed patient in ICU rounds and with RN today. Okay to begin tube feeding today via PEG. No family available to provide nutrition hx. Medication history reviewed. Patient receives MVI and vitamin C daily via tube. Unsure of usual TF regimen.   Will check vitamin/mineral panel d/t wounds and acute comatose state.  Patient is currently intubated on ventilator support. MV: 9 L/min Temp (24hrs), Avg:90.2 F (32.3 C), Min:82 F (27.8 C), Max:98.8 F (37.1 C)   Labs reviewed.  CBG: 60-103  Medications reviewed and include solu-cortef, Novolog, Keppra, Protonix, Levophed, vasopressin.  Weight history reviewed. Patient with 17% weight loss within the past year, which is concerning, but not  significant for the time frame. Weight loss likely related to muscle loss with progressive MS.  NUTRITION - FOCUSED PHYSICAL EXAM:  Deferred, patient quadriplegic and contracted.  Diet Order:   Diet Order             Diet NPO time specified  Diet effective now                   EDUCATION NEEDS:   No education needs have been identified at this time  Skin:  Skin Assessment: Skin Integrity Issues: Skin Integrity Issues:: Stage III Stage III: R ischial tuberosity, L hip  Last BM:  7/11  Height:   Ht Readings from Last 1 Encounters:  07/18/21 '6\' 4"'$  (1.93 m)    Weight:   Wt Readings from Last 1 Encounters:  07/19/21 69.9 kg     BMI:  Body mass index is 18.76 kg/m.  Estimated Nutritional Needs:   Kcal:  1900-2100  Protein:  120-140 gm  Fluid:  >/= 2 L    Lucas Mallow RD, LDN, CNSC Please refer to Amion for contact information.

## 2021-07-19 NOTE — Progress Notes (Signed)
LTM EEG hooked up and running - no initial skin breakdown - push button tested - neuro notified. [facility] monitoring.  

## 2021-07-20 DIAGNOSIS — R579 Shock, unspecified: Secondary | ICD-10-CM | POA: Diagnosis not present

## 2021-07-20 DIAGNOSIS — R569 Unspecified convulsions: Secondary | ICD-10-CM | POA: Diagnosis not present

## 2021-07-20 LAB — HEPATIC FUNCTION PANEL
ALT: 61 U/L — ABNORMAL HIGH (ref 0–44)
AST: 35 U/L (ref 15–41)
Albumin: 2 g/dL — ABNORMAL LOW (ref 3.5–5.0)
Alkaline Phosphatase: 139 U/L — ABNORMAL HIGH (ref 38–126)
Bilirubin, Direct: <0.1 mg/dL (ref 0.0–0.2)
Total Bilirubin: 0.5 mg/dL (ref 0.3–1.2)
Total Protein: 5.9 g/dL — ABNORMAL LOW (ref 6.5–8.1)

## 2021-07-20 LAB — GLUCOSE, CAPILLARY
Glucose-Capillary: 147 mg/dL — ABNORMAL HIGH (ref 70–99)
Glucose-Capillary: 131 mg/dL — ABNORMAL HIGH (ref 70–99)
Glucose-Capillary: 121 mg/dL — ABNORMAL HIGH (ref 70–99)
Glucose-Capillary: 193 mg/dL — ABNORMAL HIGH (ref 70–99)
Glucose-Capillary: 140 mg/dL — ABNORMAL HIGH (ref 70–99)
Glucose-Capillary: 165 mg/dL — ABNORMAL HIGH (ref 70–99)

## 2021-07-20 LAB — PHOSPHORUS: Phosphorus: 1.9 mg/dL — ABNORMAL LOW (ref 2.5–4.6)

## 2021-07-20 LAB — BASIC METABOLIC PANEL
Anion gap: 10 (ref 5–15)
BUN: 35 mg/dL — ABNORMAL HIGH (ref 6–20)
CO2: 22 mmol/L (ref 22–32)
Calcium: 8.5 mg/dL — ABNORMAL LOW (ref 8.9–10.3)
Chloride: 110 mmol/L (ref 98–111)
Creatinine, Ser: 0.78 mg/dL (ref 0.61–1.24)
GFR, Estimated: >60 mL/min (ref >60)
Glucose, Bld: 132 mg/dL — ABNORMAL HIGH (ref 70–99)
Potassium: 3.9 mmol/L (ref 3.5–5.1)
Sodium: 142 mmol/L (ref 135–145)

## 2021-07-20 LAB — CBC
HCT: 28.1 % — ABNORMAL LOW (ref 39.0–52.0)
Hemoglobin: 9.2 g/dL — ABNORMAL LOW (ref 13.0–17.0)
MCH: 26.7 pg (ref 26.0–34.0)
MCHC: 32.7 g/dL (ref 30.0–36.0)
MCV: 81.7 fL (ref 80.0–100.0)
Platelets: 76 10*3/uL — ABNORMAL LOW (ref 150–400)
RBC: 3.44 MIL/uL — ABNORMAL LOW (ref 4.22–5.81)
RDW: 22 % — ABNORMAL HIGH (ref 11.5–15.5)
WBC: 12.5 10*3/uL — ABNORMAL HIGH (ref 4.0–10.5)
nRBC: 0.2 % (ref 0.0–0.2)

## 2021-07-20 LAB — PROCALCITONIN: Procalcitonin: 2.1 ng/mL

## 2021-07-20 LAB — MAGNESIUM: Magnesium: 2 mg/dL (ref 1.7–2.4)

## 2021-07-20 MED ORDER — SODIUM CHLORIDE 0.9% FLUSH: 10.0000 mL | INTRAVENOUS | Status: DC | PRN

## 2021-07-20 MED ORDER — SODIUM CHLORIDE 0.9% FLUSH
10.0000 mL | Freq: Two times a day (BID) | INTRAVENOUS | Status: DC
  Administered 2021-07-20 (×2): 10 mL
  Administered 2021-07-21: 20 mL
  Administered 2021-07-21 – 2021-08-03 (×26): 10 mL

## 2021-07-20 MED ORDER — LORAZEPAM 2 MG/ML IJ SOLN
2.0000 mg | Freq: Once | INTRAMUSCULAR | Status: AC
Start: 2021-07-20 — End: 2021-07-20
  Administered 2021-07-20: 2 mg via INTRAVENOUS
  Filled 2021-07-20: qty 1

## 2021-07-20 MED ORDER — SODIUM CHLORIDE 0.9 % IV SOLN
1500.0000 mg | Freq: Once | INTRAVENOUS | Status: AC
  Administered 2021-07-20: 1500 mg via INTRAVENOUS
  Filled 2021-07-20: qty 30

## 2021-07-20 MED ORDER — PHENYTOIN SODIUM 50 MG/ML IJ SOLN
100.0000 mg | Freq: Three times a day (TID) | INTRAMUSCULAR | Status: DC
  Administered 2021-07-20 – 2021-07-31 (×32): 100 mg via INTRAVENOUS
  Filled 2021-07-20 (×34): qty 2

## 2021-07-20 NOTE — Procedures (Addendum)
Patient Name: Terry Palmer  MRN: [id number]  Epilepsy Attending: Lora Havens  Referring Physician/Provider: Lora Havens, MD Duration: 07/19/2021 1329 to 07/20/2021 1329   Patient history: 46yo m with h/o seizure, ams. EEG to evaluate for seizure.    Level of alertness:  lethargic    AEDs during EEG study: LEV   Technical aspects: This EEG study was done with scalp electrodes positioned according to the 10-20 International system of electrode placement. Electrical activity was acquired at a sampling rate of '500Hz'$  and reviewed with a high frequency filter of '70Hz'$  and a low frequency filter of '1Hz'$ . EEG data were recorded continuously and digitally stored.    Description: EEG showed 6 to 8 Hz theta-alpha activity in right hemisphere and low amplitude 2 to 3 Hz delta slowing in left hemisphere.There are qasi-periodic discharges with overriding fast activity in left hemisphere every 2 -9 seconds. Seizures without clinical Signs were also noted over right frontal region, average 2 seizures per hour, lasting about 4 minutes each.  During the seizure, EEG initially showed 9 to 10 Hz alpha activity in right frontal region which then involved all of right hemisphere and left frontal region and evolved in frequency to 5 to 6 Hz theta slowing.  Hyperventilation and photic stimulation were not performed.      ABNORMALITY -Seizure without clinical signs, right frontal region -Periodic discharges with overriding fast activity, left hemisphere -Continuous slow, generalized and lateralized left hemisphere   IMPRESSION: This study showed seizures without clinical signs arising from right frontal region, average 2 seizures per hour, lasting about 4 minutes each.  Additionally there is evidence of epileptogenicity and cortical dysfunction arising from left hemisphere with increased risk of seizure recurrence.  Lastly, There is moderate to severe diffuse encephalopathy, nonspecific etiology.  [name]  [name]

## 2021-07-20 NOTE — TOC Progression Note (Signed)
Transition of Care [facility]) - Initial/Assessment Note    Patient Details  Name: Terry Palmer MRN: [id number] Date of Birth: [birth year]  Transition of Care [facility]) CM/SW Contact:    Milinda Antis, LCSWA Phone Number: 07/20/2021, 10:44 AM  Clinical Narrative:                  Transition of Care Department [facility]) has reviewed patient.  Patient is from home with wife and has a home Doctor, general practice. Patient has MS and is bed bound / quad since at least 2013 with PEG in place.  Patient is currently intubated.  We will continue to monitor patient advancement through interdisciplinary progression rounds.      Patient Goals and CMS Choice        Expected Discharge Plan and Services                                                Prior Living Arrangements/Services                       Activities of Daily Living      Permission Sought/Granted                  Emotional Assessment              Admission diagnosis:  Shock [facility]) [R57.9] Patient Active Problem List   Diagnosis Date Noted   Shock ([place]) 07/18/2021   Femur fracture ([place]) 08/10/2020   Hyponatremia 08/10/2020   Hypokalemia 08/10/2020   Pressure injury of skin 05/20/2020   CAP (community acquired pneumonia) 05/18/2020   Osteomyelitis ([place]) 05/18/2020   Goals of care, counseling/discussion    Palliative care by specialist    DNR (do not resuscitate) discussion    Palliative care encounter    Acute metabolic encephalopathy 74/25/9563   Pneumonia due to infectious organism    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   Lactic acidosis 04/27/2016   Acute cystitis with hematuria    Suprapubic catheter ([place])    G tube feedings ([place])    Flexion contractures    Encephalopathy    Bacteremia 05/27/2015   Sepsis ([place]) 05/25/2015   Altered mental state 05/25/2015   Altered mental status 05/25/2015   Fever, unspecified 03/04/2015   Quadriplegia and quadriparesis ([place]) 12/24/2014    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   Fever    Hyperthermia, 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   Protein-calorie malnutrition, severe ([place]) 10/21/2014   UTI (urinary tract infection) 10/20/2014   Sepsis secondary to UTI ([place]) 10/20/2014   Leukocytosis 10/20/2014   Acute respiratory failure with hypoxia ([place]) 10/20/2014   Seizure disorder ([place]) 10/20/2014   Aspiration pneumonitis ([place]) 10/20/2014   Neurogenic bladder 05/28/2011   Multiple sclerosis ([place]) 04/20/2011   FTT (failure to thrive) in adult 04/20/2011   Sacral decubitus ulcer, stage II ([place]) 04/20/2011   Quadriparesis (muscle weakness) 03/12/2011   PCP:  Alvester Chou, NP Pharmacy:   [place], [place] [address] [place] [address] Phone: [phone] Fax: [phone]  [facility] [address] [address] Phone: [phone] Fax: [phone]  [id number]: [phone]: [phone]    Social  Determinants of Health (SDOH) Interventions    Readmission Risk Interventions     No data to display

## 2021-07-20 NOTE — Progress Notes (Signed)
Performed maintenance.  All under 10.  No skin breakdown

## 2021-07-20 NOTE — Progress Notes (Signed)
Neurology Progress Note   S:// Seen and examined this AM.  No significant change overnight.   O:// Current vital signs: BP 111/69   Pulse 97   Temp 99.7 F (37.6 C) (Esophageal)   Resp 18   Ht '6\' 4"'$  (1.93 m)   Wt 75.2 kg   SpO2 99%   BMI 20.18 kg/m  Vital signs in last 24 hours: Temp:  [98.1 F (36.7 C)-99.7 F (37.6 C)] 99.7 F (37.6 C) (07/12 2349) Pulse Rate:  [92-118] 97 (07/13 0700) Resp:  [16-22] 18 (07/13 0700) BP: (84-150)/(54-89) 111/69 (07/13 0700) SpO2:  [97 %-100 %] 99 % (07/13 0700) FiO2 (%):  [40 %] 40 % (07/13 0345) Weight:  [75.2 kg] 75.2 kg (07/13 0500) General: Intubated on no sedation HEENT: Normocephalic atraumatic Lungs: Vented Cardiovascular: Regular rate rhythm Abdomen nondistended nontender Neurological exam Intubated on no sedation No spontaneous movement To noxious stimulation, question flicker grimace - that is the only change from yesterady Cranial nerves: Pupils are equal sluggishly reactive to light, present corneals, breathing with the ventilator. Motor examination with contractures noted in all 4 extremities with reduction of muscle mass.  No withdrawal to noxious stimulation. Sensory examination: As above  Medications  Current Facility-Administered Medications:    0.9 %  sodium chloride infusion, 250 mL, Intravenous, Continuous, Sherwood Gambler, MD, Stopped at 07/19/21 0650   Chlorhexidine Gluconate Cloth 2 % PADS 6 each, 6 each, Topical, Q0600, Candee Furbish, MD, 6 each at 07/20/21 0926   docusate (COLACE) 50 MG/5ML liquid 100 mg, 100 mg, Per Tube, BID PRN, Candee Furbish, MD   etomidate (AMIDATE) injection, , Intravenous, PRN, Sherwood Gambler, MD, 20 mg at 07/18/21 1741   feeding supplement (PROSource TF) liquid 45 mL, 45 mL, Per Tube, QID, Candee Furbish, MD, 45 mL at 07/20/21 0925   feeding supplement (VITAL 1.5 CAL) liquid 1,000 mL, 1,000 mL, Per Tube, Continuous, Candee Furbish, MD, Last Rate: 40 mL/hr at 07/20/21 0600,  Infusion Verify at 07/20/21 0600   heparin injection 5,000 Units, 5,000 Units, Subcutaneous, Q8H, Candee Furbish, MD, 5,000 Units at 07/20/21 0541   hydrocortisone sodium succinate (SOLU-CORTEF) 100 MG injection 100 mg, 100 mg, Intravenous, Q12H, Candee Furbish, MD, 100 mg at 07/20/21 0925   insulin aspart (novoLOG) injection 0-9 Units, 0-9 Units, Subcutaneous, Q4H, Gleason, Otilio Carpen, PA-C, 1 Units at 07/20/21 0819   ipratropium-albuterol (DUONEB) 0.5-2.5 (3) MG/3ML nebulizer solution 3 mL, 3 mL, Nebulization, Q4H PRN, Andres Labrum D, PA-C   lactated ringers bolus 500 mL, 500 mL, Intravenous, Once, Sherwood Gambler, MD   levETIRAcetam (KEPPRA) IVPB 1500 mg/ 100 mL premix, 1,500 mg, Intravenous, Q12H, Amie Portland, MD, Stopped at 07/20/21 0408   norepinephrine (LEVOPHED) 16 mg in 269m premix infusion, 0-60 mcg/min, Intravenous, Titrated, SAnders Simmonds MD, Last Rate: 18.75 mL/hr at 07/20/21 0600, 20 mcg/min at 07/20/21 0600   Oral care mouth rinse, 15 mL, Mouth Rinse, Q2H, SCandee Furbish MD, 15 mL at 07/20/21 09628  Oral care mouth rinse, 15 mL, Mouth Rinse, PRN, SCandee Furbish MD   pantoprazole sodium (PROTONIX) 40 mg/20 mL oral suspension 40 mg, 40 mg, Per Tube, Daily, Gleason, LOtilio Carpen PA-C, 40 mg at 07/20/21 03662  phenylephrine CONCENTRATED '100mg'$  in sodium chloride 0.9% 2574m(0.'4mg'$ /mL) premix infusion, 0-400 mcg/min, Intravenous, Titrated, DeFarrel GordonDO, Stopped at 07/19/21 1002   piperacillin-tazobactam (ZOSYN) IVPB 3.375 g, 3.375 g, Intravenous, Q8H, DeFarrel GordonDO, Last Rate: 12.5 mL/hr at 07/20/21 0928,  3.375 g at 07/20/21 0928   polyethylene glycol (MIRALAX / GLYCOLAX) packet 17 g, 17 g, Per Tube, Daily PRN, Candee Furbish, MD   sodium chloride flush (NS) 0.9 % injection 10-40 mL, 10-40 mL, Intracatheter, Q12H, Icard, Bradley L, DO, 10 mL at 07/20/21 0926   sodium chloride flush (NS) 0.9 % injection 10-40 mL, 10-40 mL, Intracatheter, PRN, Icard, Bradley L, DO   vasopressin  (PITRESSIN) 20 Units in sodium chloride 0.9 % 100 mL infusion-*FOR SHOCK*, 0-0.04 Units/min, Intravenous, Continuous, Farrel Gordon, DO  Labs CBC    Component Value Date/Time   WBC 12.5 (H) 07/20/2021 0534   RBC 3.44 (L) 07/20/2021 0534   HGB 9.2 (L) 07/20/2021 0534   HCT 28.1 (L) 07/20/2021 0534   PLT 76 (L) 07/20/2021 0534   MCV 81.7 07/20/2021 0534   MCH 26.7 07/20/2021 0534   MCHC 32.7 07/20/2021 0534   RDW 22.0 (H) 07/20/2021 0534   LYMPHSABS 0.5 (L) 07/18/2021 1708   MONOABS 0.1 07/18/2021 1708   EOSABS 0.0 07/18/2021 1708   BASOSABS 0.0 07/18/2021 1708    CMP     Component Value Date/Time   NA 142 07/20/2021 0534   K 3.9 07/20/2021 0534   CL 110 07/20/2021 0534   CO2 22 07/20/2021 0534   GLUCOSE 132 (H) 07/20/2021 0534   BUN 35 (H) 07/20/2021 0534   CREATININE 0.78 07/20/2021 0534   CALCIUM 8.5 (L) 07/20/2021 0534   PROT 5.9 (L) 07/20/2021 0534   ALBUMIN 2.0 (L) 07/20/2021 0534   AST 35 07/20/2021 0534   ALT 61 (H) 07/20/2021 0534   ALKPHOS 139 (H) 07/20/2021 0534   BILITOT 0.5 07/20/2021 0534   GFRNONAA >60 07/20/2021 0534   GFRAA >60 12/10/2017 0442    Imaging I have reviewed images in epic and the results pertinent to this consultation are: Severely advanced atrophy for age on the CT head.  Overnight EEG -concerning for on average 2 seizures per hour lasting for about 4 minutes. EEG from yesterday had PLEDs.  Assessment:  46 year old man past history of end-stage MS with baseline quadriplegia dysphagia PEG tube, dementia due to MS, seizure disorder on Keppra at home, recurrent UTIs with neurogenic bladder, initially presented for evaluation of low urinary output and decreased mentation and continues to be extremely encephalopathic and unable to be awake off of sedation. Clinical picture is concerning for sepsis.  Given poor baseline reserve and end-stage neurodegenerative process, the sepsis is likely causing this profound encephalopathy and I would expect  some improvement although protracted with treatment of the sepsis. That said the EEG also had concern for epileptogenicity from the left hemisphere hence it was deemed prudent to watch him on EEG.  There was some improvement on the EEG after giving him benzodiazepines and an additional load of Keppra.  He was at Dr. Delsa Bern EEG. Overnight LTM EEG shows electrographic seizures as above. We will add second antiepileptic.  Impression: Subclinical status epilepticus Possible sepsis causing toxic metabolic encephalopathy.  Recommendations: Currently on Keppra 1500 twice daily. Ativan 2 mg x 1. We will add additional antiepileptics-load with fosphenytoin '20mg'$ /kg current on equivalent and start Dilantin 100 mg 3 times daily. If he continues to seize, will add phenobarb or Vimpat. We will also consider starting him on continuous sedation with propofol or Versed at that time. PCCM will have family discussions about goals of care and how aggressive they want treatment.  Based on EEG, will need to consider sedation and additional antiepileptics. We will continue  to follow Plan discussed with Dr. Tamala Julian. -- Amie Portland, MD Neurologist [facility] Pager: [phone]  CRITICAL CARE ATTESTATION Performed by: Amie Portland, MD Total critical care time: 33 minutes Critical care time was exclusive of separately billable procedures and treating other patients and/or supervising APPs/Residents/Students Critical care was necessary to treat or prevent imminent or life-threatening deterioration due to frequent seizures, toxic metabolic encephalopathy This patient is critically ill and at significant risk for neurological worsening and/or death and care requires constant monitoring. Critical care was time spent personally by me on the following activities: development of treatment plan with patient and/or surrogate as well as nursing, discussions with consultants, evaluation of patient's response to  treatment, examination of patient, obtaining history from patient or surrogate, ordering and performing treatments and interventions, ordering and review of laboratory studies, ordering and review of radiographic studies, pulse oximetry, re-evaluation of patient's condition, participation in multidisciplinary rounds and medical decision making of high complexity in the care of this patient.

## 2021-07-20 NOTE — Progress Notes (Signed)
NAME:  Terry Palmer, MRN:  [id number], DOB:  29-Mar-1975, LOS: 2 ADMISSION DATE:  07/18/2021, CONSULTATION DATE:  07/18/2021 REFERRING MD:  Dr. Regenia Skeeter, CHIEF COMPLAINT:  AMS, bradycardia   History of Present Illness:  46 y/o M who presented to [facility] ER on 7/11 with reports of low UOP.     At baseline, he lives at home with his wife and has a home Doctor, general practice.  He has MS and is bed bound / quad since at least 2013 with PEG in place.  [name] home health RN noted decreased UOP of <200 ml in 48h from his suprapubic catheter.  Almost no UOP on day of presentation. The RN changed the catheter with no change in output. At baseline, he is bed bound and blinks yes at baseline. He has been less alert. Family also reported a "rattling in his chest" for a few days. EMS was activated and patient was found to have a HR of 30.  Pacing was initiated by EMS at a rate of 70.  Initial BP 80/50. On arrival, the patient was activated as a possible sepsis. He was treated with 2.5L IVF. He was hypothermic and bair hugger was placed. The patient was noted to have a 3x3cm wound on the right and a 3x3 wound on the left hip. Per family, his wounds are actually improved.  His temperature on arrival was 82 degrees.  He was intubated in the ER per EDP. Initial labs - Na 131, K 4.6, Cl 97, CO2 26, glucose 95, BUN 64, cr 1.14, alk phos 141, albumin 2.6, AST 51, ALT 77, WBC 3.7, Hgb 9.9, and platelets 113.  ETT in good position on CXR, ? Basilar infiltrates on left.    PCCM called for ICU admission.   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 Neurological status unchanged. Vitals have stabilized somewhat, now requiring less pressor support.  Objective   Blood pressure 111/69, pulse 97, temperature 99.7 F (37.6 C), temperature source Esophageal, resp. rate 18, height _0  (1.93 m), weight 75.2 kg, SpO2 99 %. CVP:  [5 mmHg-11 mmHg] 11 mmHg  Vent Mode: PRVC FiO2 (%):  [40 %] 40 % Set Rate:  [18 bmp] 18 bmp Vt Set:  [500 mL] 500 mL PEEP:  [5 cmH20] 5 cmH20 Plateau Pressure:  [15 cmH20-17 cmH20] 16 cmH20   Intake/Output Summary (Last 24 hours) at 07/20/2021 0734 Last data filed at 07/20/2021 0706 Gross per 24 hour  Intake 2035.74 ml  Output 2775 ml  Net -739.26 ml    Filed Weights   07/18/21 2002 07/19/21 0457 07/20/21 0500  Weight: 69.9 kg 69.9 kg 75.2 kg    Examination: General: Chronically ill appearing gentleman. No acute distress. HENT: ETT in place. Lungs: Mild diffuse rhonchi on anterior auscultation. Cardiovascular: Regular rate and rhythm. No murmurs, rubs, gallops. Abdomen: Soft, nontender, nondistended. Extremities: Bilateral LE are contracted, without distal edema. Neuro: Unconscious. No blink, some gag/cough reflex.  Resolved Hospital Problem list   Hypothermia Bradycardia  Assessment & Plan:  Acute respiratory failure w/ hypercarbia  Possible LLL pneumonia Sepsis New leukocytosis overnight, 12.5 (7.9 07/12). Still on full vent support and requiring levophed for pressures, otherwise VSS. Procalcitonin pending at this time, 0.79 yesterday (0.19 at admission). Current coverage with zosyn, started yesterday--was previously on vancomycin, cefepime, metronidazole. MRSA nasal swab negative on admission. Urine culture reincubated for better growth, respiratory culture not collected yet. -Continue full vent support -LTVV strategy with tidal volumes of 6-8 cc/kg ideal body weight, goal plateau pressures less than 30 and driving pressures less  than 15 -Wean PEEP/FiO2 for SpO2 >90% -Wean sedation for RASS goal 0 to -1 -Continue zosyn -Levophed, phenylephrine, vasopressin titrated for MAP>65 -Follow intermittent CXR and ABG PRN -Consider palliative consult -Tele monitoring -Continue bair hugger -F/u urine, sputum cultures -Trend procalcitonin   Acute Metabolic Encephalopathy Hx of multiple sclerosis Quadriplegic Hx seizure disorder Patient remains unconscious, unresponsive to me on exam. -Neurology consulted, appreciate their recommendations  -Keppra 3g load yesterday, now on 1500 mg IV BID -Hold home baclofen, dantrolene, tizanidine   Type 2 diabetes mellitus A1c 5.1%. -CBG, SSI  Neurogenic bladder -Monitor UOP -Avoid nephrotoxic agents, ensure adequate renal perfusion   Pancytopenia Hgb lowered further to 10.2 (9.2 07/12), platelets drastically decreased to 76 (191 07/12). Could be secondary to infection/inflammation however I would not expect such a large decrease. No new skin rashes or lesions, or overt signs of thrombi elsewhere. He did receive several liters of IVF resuscitation during the day 07/12 so it may well be due to hemodilution as all cell lines (except WBC) are down. No signs of thrombi, bleeding, or end organ dysfunction, decreasing suspicion for DIC. 4Ts score of 2-3 making HIT very unlikely. -Trend CBC, transfuse for Hgb <7 and PLTs <20,000   Mildly elevated LFTs Stable. -Trend CMP   Hx of HTN On pressor support at this time. -Hold clonidine   Sacral Wound POA Is reportedly improved from prior. No imaging since CT abdomen/pelvis in August 2022, at which time there was concern for osteomyelitis. -Consider MRI if no continued clinical improvement despite current efforts to rule out osteomyelitis; if osteomyelitis is present, will need to consult palliative -Wound care -Offload as able   Hx of depression Given encephalopathy, currently holding home regimen. -Continue to hold home xanax,  valium, wellbutrin for now  Best Practice (right click and "Reselect all SmartList Selections" daily)   Diet/type: NPO DVT prophylaxis: prophylactic heparin  GI prophylaxis: PPI Lines: Central line Foley:  N/A Code Status:  full code Last date of multidisciplinary goals of care discussion [07/11]  Labs   CBC: Recent Labs  Lab 07/18/21 1708 07/18/21 1730 07/18/21 1837 07/19/21 0348 07/19/21 0515 07/19/21 0820 07/20/21 0534  WBC 3.7*  --   --  7.9  --   --  12.5*  NEUTROABS 3.1  --   --   --   --   --   --   HGB 9.9*   < > 11.6* 11.4* 12.2* 10.2* 9.2*  HCT 33.9*   < > 34.0* 36.0* 36.0* 30.0* 28.1*  MCV 90.9  --   --  84.7  --   --  81.7  PLT 113*  --   --  191  --   --  76*   < > = values in this interval not displayed.     Basic Metabolic Panel: Recent Labs  Lab 07/18/21 1708 07/18/21 1730 07/18/21 1837 07/19/21 0348 07/19/21 0515 07/19/21 0820  NA 131* 132* 135 137 139 138  K 4.6 4.6 4.2 3.7  3.8 3.9  CL 97*  --   --  106  --   --   CO2 26  --   --  21*  --   --   GLUCOSE 95  --   --  109*  --   --   BUN 64*  --   --  55*  --   --   CREATININE 1.14 1.30*  --  0.95  --   --   CALCIUM 8.9  --   --  9.3  --   --   MG  --   --   --  2.2  --   --   PHOS  --   --   --  2.6  --   --     GFR: Estimated Creatinine Clearance: 102.2 mL/min (by C-G formula based on SCr of 0.95 mg/dL). Recent Labs  Lab 07/18/21 1708 07/18/21 2311 07/19/21 0348 07/20/21 0534  PROCALCITON  --  0.19 0.79  --   WBC 3.7*  --  7.9 12.5*  LATICACIDVEN 1.0 2.1* 2.0*  --      Liver Function Tests: Recent Labs  Lab 07/18/21 1708 07/19/21 0348  AST 51* 45*  ALT 77* 84*  ALKPHOS 141* 146*  BILITOT 0.2* 0.4  PROT 6.3* 6.6  ALBUMIN 2.6* 2.5*    No results for input(s): "LIPASE", "AMYLASE" in the last 168 hours. Recent Labs  Lab 07/18/21 2311  AMMONIA 20     ABG    Component Value Date/Time   PHART 7.407 07/19/2021 0820   PCO2ART 32.8 07/19/2021 0820   PO2ART 73 (L)  07/19/2021 0820   HCO3 21.1 07/19/2021 0820   TCO2 22 07/19/2021 0820   ACIDBASEDEF 4.0 (H) 07/19/2021 0820   O2SAT 96 07/19/2021 0820     Coagulation Profile: Recent Labs  Lab 07/18/21 1708  INR 1.1     Cardiac Enzymes: No results for input(s): "CKTOTAL", "CKMB", "CKMBINDEX", "TROPONINI" in the last 168 hours.  HbA1C: Hgb A1c MFr Bld  Date/Time Value Ref Range Status  07/18/2021 11:11 PM 5.1 4.8 - 5.6 % Final    Comment:    (NOTE) Pre diabetes:          5.7%-6.4%  Diabetes:              >6.4%  Glycemic control for   <7.0% adults with diabetes   08/10/2020 05:42 PM 7.1 (H) 4.8 - 5.6 % Final    Comment:    (NOTE) Pre diabetes:          5.7%-6.4%  Diabetes:              >6.4%  Glycemic control for   <7.0% adults with diabetes     CBG: Recent Labs  Lab 07/19/21 1148 07/19/21 1558 07/19/21 1940 07/19/21 2330 07/20/21 0331  GLUCAP 103* 85 95 112* 147*     Review of Systems:   Unable to obtain due to encephalopathy.  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   Surgical History:   Past Surgical History:  Procedure Laterality Date   GASTROSTOMY  04/16/2011   Procedure: GASTROSTOMY;  Surgeon: Zenovia Jarred, MD;  Location: [facility] OR;  Service: General;  Laterality: N/A;  Open G-Tube placement   LUMBAR PUNCTURE  10/12/2002   SUPRAPUBIC CYSTOSTOMY       Social History:   reports that he quit smoking about 22 years ago. His smoking use included cigarettes. He has  quit using smokeless tobacco. He reports that he does not drink alcohol.   Family History:  His family history includes Asthma in his mother.   Allergies No Known Allergies   Home Medications  Prior to Admission medications   Medication Sig Start Date End Date Taking? Authorizing Provider  Accu-Chek Softclix Lancets lancets use as directed to check blood sugar up to 4 times daily 06/02/20   Mariel Aloe, MD  acetaminophen (TYLENOL) 500 MG tablet Take 1 tablet (500 mg total) by mouth every 8 (eight) hours as needed for fever (or pain). Patient taking differently: Take 500 mg by mouth 2 (two) times daily. 03/05/15   Debbe Odea, MD  ALPRAZolam Duanne Moron) 0.25 MG tablet From 5/26 to 5/30 Give 0.103m per tube two times daily; then from 5/31 to 6/6 decrease to 0.252mper tube at bedtime. Then on 6/7 to 6/11, decrease to 0.2512mer tube at bedtime every OTHER day. Then STOP 06/02/20   NetMariel AloeD  amoxicillin-clavulanate (AUGMENTIN) 875-125 MG tablet Place 1 tablet into feeding tube 2 (two) times daily. 06/12/21   Reed, Tiffany L, DO  AZO-CRANBERRY PO Take 1 tablet by mouth 2 (two) times daily.    [provider]  baclofen (LIORESAL) 20 MG tablet Place 1 tablet (20 mg total) into feeding tube 4 (four) times daily. 06/12/21   Reed, Tiffany L, DO  bisacodyl (DULCOLAX) 10 MG suppository Place 10 mg rectally once as needed for moderate constipation.    [provider]  Blood Glucose Monitoring Suppl (ACCU-CHEK GUIDE) w/Device KIT 1 each by Does not apply route as needed. 06/02/20   NetMariel AloeD  buPROPion (WELLBUTRIN) 75 MG tablet Place 1 tablet (75 mg total) into feeding tube 3 (three) times daily. 01/24/13   KriBonnielee HaffD  cloNIDine (CATAPRES) 0.1 MG tablet Place 1 tablet (0.1 mg total) into feeding tube 3 (three) times daily. 06/05/20 09/03/20  NetMariel AloeD  dantrolene (DANTRIUM) 50 MG capsule Give 50 mg by tube 4 (four) times daily. At 0800, 1200, 1600, 2000     [provider]  diazepam (VALIUM) 5 MG tablet Place 1 tablet (5 mg total) into feeding tube at bedtime as needed for anxiety (sleep). Hold until completion of Ativan 06/05/20   NetMariel AloeD  famotidine (PEPCID) 40 MG/5ML suspension Take 20 mg by mouth daily. 04/28/20   [provider]  glucose blood (ACCU-CHEK GUIDE) test strip Use to check blood sugar up to four times daily 06/02/20   NetMariel AloeD  guaifenesin (ROBITUSSIN) 100 MG/5ML syrup Take 200 mg by mouth 3 (three) times daily as needed for cough.    [provider]  HYDROcodone-acetaminophen (NORCO) 7.5-325 MG per tablet Place 1 tablet into feeding tube every 6 (six) hours as needed (pain).     [provider]  insulin detemir (LEVEMIR) 100 UNIT/ML injection Inject 0.22 mLs (22 Units total) into the skin daily. 06/06/20 09/04/20  NetMariel AloeD  Insulin Syringe-Needle U-100 (INSULIN SYRINGE .3CC/31GX5/16") 31G X 5/16" 0.3 ML MISC Use with Levemir prescription. 06/05/20   NetMariel AloeD  ipratropium-albuterol (DUONEB) 0.5-2.5 (3) MG/3ML SOLN Take 3 mLs by nebulization  See admin instructions. Four times daily and as needed for shortness of breath 06/12/21   Reed, Tiffany L, DO  levETIRAcetam (KEPPRA) 100 MG/ML solution Take 1,200 mg by mouth in the morning, at noon, and at bedtime. 12 ml    [provider]  Multiple Vitamin (MULTIVITAMIN WITH MINERALS) TABS tablet Place 1 tablet into feeding tube daily. 01/24/13   Bonnielee Haff, MD  omega-3 acid ethyl esters (LOVAZA) 1 g capsule 1 capsule daily. tube 06/13/20   [provider]  polyethylene glycol (MIRALAX / GLYCOLAX) packet Place 17 g into feeding tube daily. Patient taking differently: Place 17 g into feeding tube every evening. 01/24/13   Bonnielee Haff, MD  polyvinyl alcohol (LIQUIFILM TEARS) 1.4 % ophthalmic solution Place 1 drop into both eyes 3 (three) times daily. Patient taking differently: Place 1 drop into both eyes  2 (two) times daily. 01/24/13   Bonnielee Haff, MD  tiZANidine (ZANAFLEX) 2 MG tablet Take 1 tablet (2 mg total) by mouth every 6 (six) hours as needed for muscle spasms. Patient taking differently: Place 2 mg into feeding tube 3 (three) times daily. 11/25/15   Kathrynn Ducking, MD  vitamin C (ASCORBIC ACID) 500 MG tablet Place 500 mg into feeding tube daily.     [provider]  Water For Irrigation, Sterile (FREE WATER) SOLN Place 240 mLs into feeding tube every 4 (four) hours. 01/24/13   Bonnielee Haff, MD     Critical care time: [place], DO Internal Medicine PGY-2

## 2021-07-20 NOTE — Progress Notes (Addendum)
Dr. Tamala Julian communicated with family. They would like to treat seizures aggressively. Will review EEG and proceed to burst suppression if needed although he has not had technically status epilepticus but has had frequent seizures.  The goal would be to do seizure suppression.  I have added a second antiepileptic.  We will watch him on EEG and make determinations on adding a third antiepileptic going forward.  Discussed the plan with the ICU team.  -- Amie Portland, MD Neurologist [facility] Pager: [phone]

## 2021-07-20 NOTE — Progress Notes (Signed)
Ongoing seizures.  Reached out to wife to discuss burst suppression trial vs. Comfort care, she will discuss with his mother and let us know.  In meantime additional AEDs added per Dr. Rory Percy.  Erskine Emery MD PCCM

## 2021-07-21 DIAGNOSIS — R569 Unspecified convulsions: Secondary | ICD-10-CM | POA: Diagnosis not present

## 2021-07-21 DIAGNOSIS — R579 Shock, unspecified: Secondary | ICD-10-CM | POA: Diagnosis not present

## 2021-07-21 LAB — BASIC METABOLIC PANEL
Anion gap: 9 (ref 5–15)
BUN: 35 mg/dL — ABNORMAL HIGH (ref 6–20)
CO2: 25 mmol/L (ref 22–32)
Calcium: 8.5 mg/dL — ABNORMAL LOW (ref 8.9–10.3)
Chloride: 110 mmol/L (ref 98–111)
Creatinine, Ser: 0.69 mg/dL (ref 0.61–1.24)
GFR, Estimated: >60 mL/min (ref >60)
Glucose, Bld: 207 mg/dL — ABNORMAL HIGH (ref 70–99)
Potassium: 4 mmol/L (ref 3.5–5.1)
Sodium: 144 mmol/L (ref 135–145)

## 2021-07-21 LAB — URINE CULTURE: Culture: 2000 — AB

## 2021-07-21 LAB — CBC
HCT: 28.5 % — ABNORMAL LOW (ref 39.0–52.0)
Hemoglobin: 9 g/dL — ABNORMAL LOW (ref 13.0–17.0)
MCH: 26.2 pg (ref 26.0–34.0)
MCHC: 31.6 g/dL (ref 30.0–36.0)
MCV: 83.1 fL (ref 80.0–100.0)
Platelets: 44 10*3/uL — ABNORMAL LOW (ref 150–400)
RBC: 3.43 MIL/uL — ABNORMAL LOW (ref 4.22–5.81)
RDW: 22 % — ABNORMAL HIGH (ref 11.5–15.5)
WBC: 10.2 10*3/uL (ref 4.0–10.5)
nRBC: 0 % (ref 0.0–0.2)

## 2021-07-21 LAB — GLUCOSE, CAPILLARY
Glucose-Capillary: 193 mg/dL — ABNORMAL HIGH (ref 70–99)
Glucose-Capillary: 146 mg/dL — ABNORMAL HIGH (ref 70–99)
Glucose-Capillary: 159 mg/dL — ABNORMAL HIGH (ref 70–99)
Glucose-Capillary: 157 mg/dL — ABNORMAL HIGH (ref 70–99)
Glucose-Capillary: 103 mg/dL — ABNORMAL HIGH (ref 70–99)
Glucose-Capillary: 128 mg/dL — ABNORMAL HIGH (ref 70–99)

## 2021-07-21 LAB — PHOSPHORUS: Phosphorus: 1.9 mg/dL — ABNORMAL LOW (ref 2.5–4.6)

## 2021-07-21 LAB — MAGNESIUM: Magnesium: 2.1 mg/dL (ref 1.7–2.4)

## 2021-07-21 LAB — VITAMIN B1: Vitamin B1 (Thiamine): 247.9 nmol/L — ABNORMAL HIGH (ref 66.5–200.0)

## 2021-07-21 LAB — VITAMIN C: Vitamin C: 0.6 mg/dL (ref 0.4–2.0)

## 2021-07-21 MED ORDER — SODIUM PHOSPHATES 45 MMOLE/15ML IV SOLN
30.0000 mmol | Freq: Once | INTRAVENOUS | Status: AC
  Administered 2021-07-21: 30 mmol via INTRAVENOUS
  Filled 2021-07-21: qty 10

## 2021-07-21 NOTE — Progress Notes (Signed)
NAME:  YVAN DORITY, MRN:  [id number], DOB:  11-24-1975, LOS: 3 ADMISSION DATE:  07/18/2021, CONSULTATION DATE:  07/18/2021 REFERRING MD:  Dr. Regenia Skeeter, CHIEF COMPLAINT:  AMS, bradycardia   History of Present Illness:  46 y/o M who presented to [facility] ER on 7/11 with reports of low UOP.     At baseline, he lives at home with his wife and has a home Doctor, general practice.  He has MS and is bed bound / quad since at least 2013 with PEG in place.  [name] home health RN noted decreased UOP of <200 ml in 48h from his suprapubic catheter.  Almost no UOP on day of presentation. The RN changed the catheter with no change in output. At baseline, he is bed bound and blinks yes at baseline. He has been less alert. Family also reported a "rattling in his chest" for a few days. EMS was activated and patient was found to have a HR of 30.  Pacing was initiated by EMS at a rate of 70.  Initial BP 80/50. On arrival, the patient was activated as a possible sepsis. He was treated with 2.5L IVF. He was hypothermic and bair hugger was placed. The patient was noted to have a 3x3cm wound on the right and a 3x3 wound on the left hip. Per family, his wounds are actually improved.  His temperature on arrival was 82 degrees.  He was intubated in the ER per EDP. Initial labs - Na 131, K 4.6, Cl 97, CO2 26, glucose 95, BUN 64, cr 1.14, alk phos 141, albumin 2.6, AST 51, ALT 77, WBC 3.7, Hgb 9.9, and platelets 113.  ETT in good position on CXR, ? Basilar infiltrates on left.    PCCM called for ICU admission.   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 On vanc,  cefepime, metronidazole 7/12 Started zosyn  Interim History / Subjective:  Patient is on vent support, resting comfortably in bed. Family wishes to pursue aggressive seizure suppression.  Objective   Blood pressure 120/77, pulse 80, temperature (!) 96.3 F (35.7 C), temperature source Bladder, resp. rate 18, height 6' 4"  (1.93 m), weight 75.3 kg, SpO2 96 %. CVP:  [2 mmHg-16 mmHg] 2 mmHg  Vent Mode: PRVC FiO2 (%):  [40 %] 40 % Set Rate:  [18 bmp] 18 bmp Vt Set:  [500 mL] 500 mL PEEP:  [5 cmH20] 5 cmH20 Plateau Pressure:  [15 cmH20-18 cmH20] 17 cmH20   Intake/Output Summary (Last 24 hours) at 07/21/2021 0809 Last data filed at 07/21/2021 0528 Gross per 24 hour  Intake 1628.35 ml  Output 1600 ml  Net 28.35 ml    Filed Weights   07/19/21 0457 07/20/21 0500 07/21/21 0357  Weight: 69.9 kg 75.2 kg 75.3 kg    Examination: General: Chronically ill appearing gentleman. No acute distress. HENT: ETT in place. Lungs: Minimal rhonchi on anterior auscultation. Cardiovascular: Regular rate and rhythm. No murmurs, rubs, gallops. Abdomen: Soft, nontender, nondistended. Extremities: Bilateral LE are contracted, without distal edema. Skin: Skin is warm and dry. No lesions or rashes noted. Does have healing sacral wounds, on the R with ~6PV ovalic open  wound that has pink tissue beneath, on the L with small circular opening that has drainage. No increased erythema surrounding these wounds. Neuro: Unconscious. Blink, cough, gag reflexes present. Grimaces to pain. Remains very contracted in bilateral UE and LE.   Resolved Hospital Problem list   Hypothermia Bradycardia  Assessment & Plan:  Acute respiratory failure w/ hypercarbia Possible LLL pneumonia Sepsis Leukocytosis has resolved, 10.2. His temperature remains labile, at time requiring bair hugger. Still on full vent support and previously requiring levophed for pressures, now actively trialing being off of it; otherwise VSS. Current  coverage with zosyn, started 07/12. Urine culture with >200 colonies proteus, respiratory culture not collected. -Continue full vent support -LTVV strategy with tidal volumes of 6-8 cc/kg ideal body weight, goal plateau pressures less than 30 and driving pressures less than 15 -Wean PEEP/FiO2 for SpO2 >90% -Wean sedation for RASS goal 0 to -1 -Continue zosyn, day 3/5 -Levophed, phenylephrine, vasopressin titrated for MAP>65 -Follow intermittent CXR, ABG PRN, procalcitonin -Consider palliative consult if plateau in progress or further decomp -Tele monitoring -Continue bair hugger -Follow urine  Pancytopenia Cell lines are stable with exception of platelets which are now 44 from 76. End-organ function is stable at this time and there are no signs of active bleed, no new lesions or rashes, Hgb stable. I would expect if 2/2 hemodilution, all cell lines would reflect a decrease which is not the case. He is on heparin though 4Ts score remains low, lowering suspicion for HIT. He remains in an acute inflammatory state and was septic on admission, so thrombocytopenia could be a delayed response to this. -Trend CBC, transfuse for Hgb <7 and PLTs <20,000 -Hold heparin   Acute Metabolic Encephalopathy Hx of multiple sclerosis Quadriplegic Hx seizure disorder Patient has gag, cough, blink reflexes and response to pain. Family discussion yesterday concluded with decision to pursue aggressive seizure suppression measures. Hopefully, keppra and phenytoin will help achieve this, but if not plan will be to attempt burst suppression. -Neurology consulted, appreciate their recommendations  -Keppra 3g load yesterday, now on 1500 mg IV BID -Hold home baclofen, dantrolene, tizanidine   Type 2 diabetes mellitus A1c 5.1%. -CBG, SSI  Neurogenic bladder -Monitor UOP -Avoid nephrotoxic agents, ensure adequate renal perfusion   Mildly elevated LFTs Stable. -Trend CMP   Hx of HTN -Hold clonidine   Sacral  Wound POA Is reportedly improved from prior. On my exam the wounds do not appear acutely infected.  -Wound care -Offload as able   Hx of depression Given encephalopathy, currently holding home regimen. -Continue to hold home xanax, valium, wellbutrin for now  Best Practice (right click and "Reselect all SmartList Selections" daily)   Diet/type: Tube feeds DVT prophylaxis: SCDs GI prophylaxis: PPI Lines: Central line Foley:  N/A Code Status:  full code Last date of multidisciplinary goals of care discussion [07/13]  Labs   CBC: Recent Labs  Lab 07/18/21 1708 07/18/21 1730 07/19/21 0348 07/19/21 0515 07/19/21 0820 07/20/21 0534 07/21/21 0355  WBC 3.7*  --  7.9  --   --  12.5* 10.2  NEUTROABS 3.1  --   --   --   --   --   --   HGB 9.9*   < > 11.4* 12.2* 10.2* 9.2* 9.0*  HCT 33.9*   < > 36.0* 36.0* 30.0* 28.1* 28.5*  MCV 90.9  --  84.7  --   --  81.7 83.1  PLT 113*  --  191  --   --  76*  44*   < > = values in this interval not displayed.     Basic Metabolic Panel: Recent Labs  Lab 07/18/21 1708 07/18/21 1730 07/18/21 1837 07/19/21 0348 07/19/21 0515 07/19/21 0820 07/20/21 0534 07/21/21 0355  NA 131* 132*   < > 137 139 138 142 144  K 4.6 4.6   < > 3.7 3.8 3.9 3.9 4.0  CL 97*  --   --  106  --   --  110 110  CO2 26  --   --  21*  --   --  22 25  GLUCOSE 95  --   --  109*  --   --  132* 207*  BUN 64*  --   --  55*  --   --  35* 35*  CREATININE 1.14 1.30*  --  0.95  --   --  0.78 0.69  CALCIUM 8.9  --   --  9.3  --   --  8.5* 8.5*  MG  --   --   --  2.2  --   --  2.0 2.1  PHOS  --   --   --  2.6  --   --  1.9* 1.9*   < > = values in this interval not displayed.    GFR: Estimated Creatinine Clearance: 121.6 mL/min (by C-G formula based on SCr of 0.69 mg/dL). Recent Labs  Lab 07/18/21 1708 07/18/21 2311 07/19/21 0348 07/20/21 0534 07/21/21 0355  PROCALCITON  --  0.19 0.79 2.10  --   WBC 3.7*  --  7.9 12.5* 10.2  LATICACIDVEN 1.0 2.1* 2.0*  --   --       Liver Function Tests: Recent Labs  Lab 07/18/21 1708 07/19/21 0348 07/20/21 0534  AST 51* 45* 35  ALT 77* 84* 61*  ALKPHOS 141* 146* 139*  BILITOT 0.2* 0.4 0.5  PROT 6.3* 6.6 5.9*  ALBUMIN 2.6* 2.5* 2.0*    No results for input(s): "LIPASE", "AMYLASE" in the last 168 hours. Recent Labs  Lab 07/18/21 2311  AMMONIA 20     ABG    Component Value Date/Time   PHART 7.407 07/19/2021 0820   PCO2ART 32.8 07/19/2021 0820   PO2ART 73 (L) 07/19/2021 0820   HCO3 21.1 07/19/2021 0820   TCO2 22 07/19/2021 0820   ACIDBASEDEF 4.0 (H) 07/19/2021 0820   O2SAT 96 07/19/2021 0820     Coagulation Profile: Recent Labs  Lab 07/18/21 1708  INR 1.1     Cardiac Enzymes: No results for input(s): "CKTOTAL", "CKMB", "CKMBINDEX", "TROPONINI" in the last 168 hours.  HbA1C: Hgb A1c MFr Bld  Date/Time Value Ref Range Status  07/18/2021 11:11 PM 5.1 4.8 - 5.6 % Final    Comment:    (NOTE) Pre diabetes:          5.7%-6.4%  Diabetes:              >6.4%  Glycemic control for   <7.0% adults with diabetes   08/10/2020 05:42 PM 7.1 (H) 4.8 - 5.6 % Final    Comment:    (NOTE) Pre diabetes:          5.7%-6.4%  Diabetes:              >6.4%  Glycemic control for   <7.0% adults with diabetes     CBG: Recent Labs  Lab 07/20/21 1539 07/20/21 2014 07/20/21 2323 07/21/21 0345 07/21/21 0752  GLUCAP 193* 140* 165* 193* 146*  Review of Systems:   Unable to obtain due to encephalopathy.  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   Surgical History:   Past Surgical History:  Procedure Laterality Date   GASTROSTOMY  04/16/2011   Procedure: GASTROSTOMY;  Surgeon: Zenovia Jarred, MD;  Location: [place];  Service: General;  Laterality: N/A;  Open G-Tube placement   LUMBAR PUNCTURE  10/12/2002   SUPRAPUBIC CYSTOSTOMY       Social History:   reports that he quit smoking about 22 years ago. His smoking use included cigarettes. He has quit using smokeless tobacco. He reports that he does not drink alcohol.   Family History:  His family history includes Asthma in his mother.   Allergies No Known Allergies   Home Medications  Prior to Admission medications   Medication Sig Start Date End Date Taking? Authorizing Provider  Accu-Chek Softclix Lancets lancets use as directed to check blood sugar up to 4 times daily 06/02/20   Mariel Aloe, MD  acetaminophen (TYLENOL) 500 MG tablet Take 1 tablet (500 mg total) by mouth every 8 (eight) hours as needed for fever (or pain). Patient taking differently: Take 500 mg by mouth 2 (two) times daily. 03/05/15   Debbe Odea, MD  ALPRAZolam Duanne Moron) 0.25 MG tablet From 5/26 to 5/30 Give 0.46m per tube two times daily; then from 5/31 to 6/6 decrease to 0.229mper tube at bedtime. Then on 6/7 to 6/11, decrease to 0.2580mer tube at bedtime every OTHER day. Then STOP 06/02/20   NetMariel AloeD  amoxicillin-clavulanate (AUGMENTIN) 875-125 MG tablet Place 1 tablet into feeding tube 2 (two) times daily. 06/12/21   Reed, Tiffany L, DO  AZO-CRANBERRY PO Take 1 tablet by mouth 2 (two) times daily.    [provider]  baclofen (LIORESAL) 20 MG tablet Place 1 tablet (20 mg total) into feeding tube 4 (four) times daily. 06/12/21   Reed, Tiffany L, DO  bisacodyl (DULCOLAX) 10 MG suppository Place 10 mg rectally once as needed for moderate constipation.    [provider]  Blood Glucose Monitoring Suppl (ACCU-CHEK GUIDE)  w/Device KIT 1 each by Does not apply route as needed. 06/02/20   NetMariel AloeD  buPROPion (WELLBUTRIN) 75 MG tablet Place 1 tablet (75 mg total) into feeding tube 3 (three) times daily. 01/24/13   KriBonnielee HaffD  cloNIDine (CATAPRES) 0.1 MG tablet Place 1 tablet (0.1 mg total) into feeding tube 3 (three) times daily. 06/05/20 09/03/20  NetMariel AloeD  dantrolene (DANTRIUM) 50 MG capsule Give 50 mg by tube 4 (four) times daily. At 0800, 1200, 1600, 2000    [provider]  diazepam (VALIUM) 5 MG tablet Place 1 tablet (5 mg total) into feeding tube at bedtime as needed for anxiety (sleep). Hold until completion of Ativan 06/05/20   NetMariel AloeD  famotidine (PEPCID) 40 MG/5ML suspension Take 20 mg by mouth daily. 04/28/20   [provider]  glucose blood (ACCU-CHEK GUIDE) test strip Use to check blood sugar up to four times daily 06/02/20   NetMariel AloeD  guaifenesin (ROBITUSSIN) 100 MG/5ML syrup Take  200 mg by mouth 3 (three) times daily as needed for cough.    [provider]  HYDROcodone-acetaminophen (NORCO) 7.5-325 MG per tablet Place 1 tablet into feeding tube every 6 (six) hours as needed (pain).     [provider]  insulin detemir (LEVEMIR) 100 UNIT/ML injection Inject 0.22 mLs (22 Units total) into the skin daily. 06/06/20 09/04/20  Mariel Aloe, MD  Insulin Syringe-Needle U-100 (INSULIN SYRINGE .3CC/31GX5/16") 31G X 5/16" 0.3 ML MISC Use with Levemir prescription. 06/05/20   Mariel Aloe, MD  ipratropium-albuterol (DUONEB) 0.5-2.5 (3) MG/3ML SOLN Take 3 mLs by nebulization See admin instructions. Four times daily and as needed for shortness of breath 06/12/21   Reed, Tiffany L, DO  levETIRAcetam (KEPPRA) 100 MG/ML solution Take 1,200 mg by mouth in the morning, at noon, and at bedtime. 12 ml    [provider]  Multiple Vitamin (MULTIVITAMIN WITH MINERALS) TABS tablet Place 1 tablet into feeding tube daily. 01/24/13   Bonnielee Haff, MD  omega-3 acid ethyl esters (LOVAZA) 1 g capsule 1 capsule daily. tube 06/13/20   [provider]  polyethylene glycol (MIRALAX / GLYCOLAX) packet Place 17 g into feeding tube daily. Patient taking differently: Place 17 g into feeding tube every evening. 01/24/13   Bonnielee Haff, MD  polyvinyl alcohol (LIQUIFILM TEARS) 1.4 % ophthalmic solution Place 1 drop into both eyes 3 (three) times daily. Patient taking differently: Place 1 drop into both eyes 2 (two) times daily. 01/24/13   Bonnielee Haff, MD  tiZANidine (ZANAFLEX) 2 MG tablet Take 1 tablet (2 mg total) by mouth every 6 (six) hours as needed for muscle spasms. Patient taking differently: Place 2 mg into feeding tube 3 (three) times daily. 11/25/15   Kathrynn Ducking, MD  vitamin C (ASCORBIC ACID) 500 MG tablet Place 500 mg into feeding tube daily.     [provider]  Water For Irrigation, Sterile (FREE WATER) SOLN Place 240 mLs into feeding tube every 4 (four) hours. 01/24/13   Bonnielee Haff, MD     Critical care time: [place], DO Internal Medicine PGY-2

## 2021-07-21 NOTE — Progress Notes (Signed)
[facility] ADULT ICU REPLACEMENT PROTOCOL   The patient does apply for the [facility] Adult ICU Electrolyte Replacment Protocol based on the criteria listed below:   1.Exclusion criteria: TCTS patients, ECMO patients, and Dialysis patients 2. Is GFR >/= 30 ml/min? Yes.    Patient's GFR today is >60 3. Is SCr </= 2? Yes.   Patient's SCr is 0.69 mg/dL 4. Did SCr increase >/= 0.5 in 24 hours? No. 5.Pt's weight >40kg  Yes.   6. Abnormal electrolyte(s):   Phos 1.9  7. Electrolytes replaced per protocol 8.  Call MD STAT for K+ </= 2.5, Phos </= 1, or Mag </= 1 Physician:  S. Rosie Fate R Sargon Scouten 07/21/2021 5:45 AM

## 2021-07-21 NOTE — Progress Notes (Signed)
[facility]) Hospital Liaison note:  This patient is currently enrolled in [facility] outpatient-based Palliative Care. Will continue to follow for disposition.  Please call with any outpatient palliative questions or concerns.  Thank you, Lorelee Market, LPN [facility] Liaison [phone]

## 2021-07-21 NOTE — Progress Notes (Signed)
Performed maintenance.  All under 10.  No skin breakdown.

## 2021-07-21 NOTE — Progress Notes (Signed)
Neurology Progress Note   S:// Seen and examined this AM.  No significant change overnight in terms of clinical status/exam.  O:// Current vital signs: BP 120/77   Pulse 80   Temp (!) 97.4 F (36.3 C) (Oral)   Resp 18   Ht '6\' 4"'$  (1.93 m)   Wt 75.3 kg   SpO2 96%   BMI 20.21 kg/m  Vital signs in last 24 hours: Temp:  [96.3 F (35.7 C)-99.7 F (37.6 C)] 97.4 F (36.3 C) (07/14 0800) Pulse Rate:  [75-101] 80 (07/14 0500) Resp:  [18-19] 18 (07/14 0500) BP: (104-140)/(69-90) 120/77 (07/14 0500) SpO2:  [96 %-100 %] 96 % (07/14 0730) FiO2 (%):  [40 %] 40 % (07/14 0730) Weight:  [75.3 kg] 75.3 kg (07/14 0357) General: Intubated on no sedation HEENT: Normocephalic atraumatic Lungs: Vented Cardiovascular: Regular rate rhythm Abdomen nondistended nontender Neurological exam Intubated on no sedation No spontaneous movement To noxious stimulation, question flicker grimace inconsistently Cranial nerves: Pupils are equal sluggishly reactive to light, present corneals, breathing with the ventilator. Motor examination with contractures noted in all 4 extremities with reduction of muscle mass.  No withdrawal to noxious stimulation. Sensory examination: As above  Medications  Current Facility-Administered Medications:    0.9 %  sodium chloride infusion, 250 mL, Intravenous, Continuous, Sherwood Gambler, MD, Last Rate: 10 mL/hr at 07/21/21 0146, 250 mL at 07/21/21 0146   Chlorhexidine Gluconate Cloth 2 % PADS 6 each, 6 each, Topical, Q0600, Candee Furbish, MD, 6 each at 07/21/21 0858   docusate (COLACE) 50 MG/5ML liquid 100 mg, 100 mg, Per Tube, BID PRN, Candee Furbish, MD   etomidate (AMIDATE) injection, , Intravenous, PRN, Sherwood Gambler, MD, 20 mg at 07/18/21 1741   feeding supplement (PROSource TF) liquid 45 mL, 45 mL, Per Tube, QID, Candee Furbish, MD, 45 mL at 07/21/21 0930   feeding supplement (VITAL 1.5 CAL) liquid 1,000 mL, 1,000 mL, Per Tube, Continuous, Candee Furbish, MD,  Last Rate: 55 mL/hr at 07/20/21 2009, 1,000 mL at 07/20/21 2009   heparin injection 5,000 Units, 5,000 Units, Subcutaneous, Q8H, Candee Furbish, MD, 5,000 Units at 07/21/21 0531   hydrocortisone sodium succinate (SOLU-CORTEF) 100 MG injection 100 mg, 100 mg, Intravenous, Q12H, Candee Furbish, MD, 100 mg at 07/20/21 2239   insulin aspart (novoLOG) injection 0-9 Units, 0-9 Units, Subcutaneous, Q4H, Gleason, Otilio Carpen, PA-C, 1 Units at 07/21/21 4481   ipratropium-albuterol (DUONEB) 0.5-2.5 (3) MG/3ML nebulizer solution 3 mL, 3 mL, Nebulization, Q4H PRN, Andres Labrum D, PA-C   lactated ringers bolus 500 mL, 500 mL, Intravenous, Once, Sherwood Gambler, MD   levETIRAcetam (KEPPRA) IVPB 1500 mg/ 100 mL premix, 1,500 mg, Intravenous, Q12H, Amie Portland, MD, Last Rate: 400 mL/hr at 07/21/21 0228, 1,500 mg at 07/21/21 0228   norepinephrine (LEVOPHED) 16 mg in 222m premix infusion, 0-60 mcg/min, Intravenous, Titrated, SAnders Simmonds MD, Last Rate: 1.41 mL/hr at 07/21/21 0645, 1.5 mcg/min at 07/21/21 0645   Oral care mouth rinse, 15 mL, Mouth Rinse, Q2H, SCandee Furbish MD, 15 mL at 07/21/21 08563  Oral care mouth rinse, 15 mL, Mouth Rinse, PRN, SCandee Furbish MD   pantoprazole sodium (PROTONIX) 40 mg/20 mL oral suspension 40 mg, 40 mg, Per Tube, Daily, Gleason, LOtilio Carpen PA-C, 40 mg at 07/20/21 01497  phenylephrine CONCENTRATED '100mg'$  in sodium chloride 0.9% 2575m(0.'4mg'$ /mL) premix infusion, 0-400 mcg/min, Intravenous, Titrated, DeFarrel GordonDO, Stopped at 07/19/21 1002   phenytoin (DILANTIN) injection 100 mg,  100 mg, Intravenous, Q8H, Amie Portland, MD, 100 mg at 07/21/21 0352   piperacillin-tazobactam (ZOSYN) IVPB 3.375 g, 3.375 g, Intravenous, Q8H, Farrel Gordon, DO, Last Rate: 12.5 mL/hr at 07/21/21 0152, 3.375 g at 07/21/21 0152   polyethylene glycol (MIRALAX / GLYCOLAX) packet 17 g, 17 g, Per Tube, Daily PRN, Candee Furbish, MD   sodium chloride flush (NS) 0.9 % injection 10-40 mL, 10-40 mL,  Intracatheter, Q12H, Icard, Bradley L, DO, 10 mL at 07/20/21 2241   sodium chloride flush (NS) 0.9 % injection 10-40 mL, 10-40 mL, Intracatheter, PRN, Icard, Bradley L, DO   sodium phosphate 30 mmol in dextrose 5 % 250 mL infusion, 30 mmol, Intravenous, Once, Anders Simmonds, MD, Last Rate: 43 mL/hr at 07/21/21 0644, 30 mmol at 07/21/21 0644   vasopressin (PITRESSIN) 20 Units in sodium chloride 0.9 % 100 mL infusion-*FOR SHOCK*, 0-0.04 Units/min, Intravenous, Continuous, Farrel Gordon, DO  Labs CBC    Component Value Date/Time   WBC 10.2 07/21/2021 0355   RBC 3.43 (L) 07/21/2021 0355   HGB 9.0 (L) 07/21/2021 0355   HCT 28.5 (L) 07/21/2021 0355   PLT 44 (L) 07/21/2021 0355   MCV 83.1 07/21/2021 0355   MCH 26.2 07/21/2021 0355   MCHC 31.6 07/21/2021 0355   RDW 22.0 (H) 07/21/2021 0355   LYMPHSABS 0.5 (L) 07/18/2021 1708   MONOABS 0.1 07/18/2021 1708   EOSABS 0.0 07/18/2021 1708   BASOSABS 0.0 07/18/2021 1708    CMP     Component Value Date/Time   NA 144 07/21/2021 0355   K 4.0 07/21/2021 0355   CL 110 07/21/2021 0355   CO2 25 07/21/2021 0355   GLUCOSE 207 (H) 07/21/2021 0355   BUN 35 (H) 07/21/2021 0355   CREATININE 0.69 07/21/2021 0355   CALCIUM 8.5 (L) 07/21/2021 0355   PROT 5.9 (L) 07/20/2021 0534   ALBUMIN 2.0 (L) 07/20/2021 0534   AST 35 07/20/2021 0534   ALT 61 (H) 07/20/2021 0534   ALKPHOS 139 (H) 07/20/2021 0534   BILITOT 0.5 07/20/2021 0534   GFRNONAA >60 07/21/2021 0355   GFRAA >60 12/10/2017 0442    Imaging I have reviewed images in epic and the results pertinent to this consultation are: Severely advanced atrophy for age on the CT head.  Overnight EEG -only 1 seizure after midnight.  Much improved from the prior night. Prior night's EEG- concerning for on average 2 seizures per hour lasting for about 4 minutes.  Assessment:  46 year old man past history of end-stage MS with baseline quadriplegia dysphagia PEG tube, dementia due to MS, seizure disorder on  Keppra at home, recurrent UTIs with neurogenic bladder, initially presented for evaluation of low urinary output and decreased mentation and continues to be extremely encephalopathic and unable to be awake off of sedation. Clinical picture is concerning for sepsis.  Given poor baseline reserve and end-stage neurodegenerative process, the sepsis is likely causing this profound encephalopathy and I would expect some improvement although protracted with treatment of the sepsis. That said the EEG also had concern for epileptogenicity from the left hemisphere hence it was deemed prudent to watch him on EEG.  There was some improvement on the EEG after giving him benzodiazepines and an additional load of Keppra.   Hooked up to LTM EEG which showed frequent seizures.  Dilantin added to the second agent.  After addition of the second agent, only 1 seizure noted overnight. We will continue to hold course  Impression: Frequent subclinical seizures/status epilepticus Multifactorial toxic  metabolic encephalopathy  Recommendations: Continue Keppra and Dilantin Maintain seizure precautions Continue correction of toxic metabolic derangements as you are Goal would continue to be seizure suppression. Discussed with his wife at bedside this evening. Plan relayed to Dr. Kimber Relic over secure chat    -- Amie Portland, MD Neurologist [facility] Pager: [phone]  CRITICAL CARE ATTESTATION Performed by: Amie Portland, MD Total critical care time: 33 minutes Critical care time was exclusive of separately billable procedures and treating other patients and/or supervising APPs/Residents/Students Critical care was necessary to treat or prevent imminent or life-threatening deterioration due to frequent seizures, toxic metabolic encephalopathy This patient is critically ill and at significant risk for neurological worsening and/or death and care requires constant monitoring. Critical care was time spent  personally by me on the following activities: development of treatment plan with patient and/or surrogate as well as nursing, discussions with consultants, evaluation of patient's response to treatment, examination of patient, obtaining history from patient or surrogate, ordering and performing treatments and interventions, ordering and review of laboratory studies, ordering and review of radiographic studies, pulse oximetry, re-evaluation of patient's condition, participation in multidisciplinary rounds and medical decision making of high complexity in the care of this patient.

## 2021-07-22 DIAGNOSIS — Z7189 Other specified counseling: Secondary | ICD-10-CM | POA: Diagnosis not present

## 2021-07-22 DIAGNOSIS — N179 Acute kidney failure, unspecified: Secondary | ICD-10-CM | POA: Diagnosis not present

## 2021-07-22 DIAGNOSIS — A419 Sepsis, unspecified organism: Secondary | ICD-10-CM | POA: Diagnosis not present

## 2021-07-22 DIAGNOSIS — Z515 Encounter for palliative care: Secondary | ICD-10-CM | POA: Diagnosis not present

## 2021-07-22 DIAGNOSIS — R6521 Severe sepsis with septic shock: Secondary | ICD-10-CM

## 2021-07-22 DIAGNOSIS — R569 Unspecified convulsions: Secondary | ICD-10-CM | POA: Diagnosis not present

## 2021-07-22 DIAGNOSIS — J9600 Acute respiratory failure, unspecified whether with hypoxia or hypercapnia: Secondary | ICD-10-CM | POA: Diagnosis not present

## 2021-07-22 LAB — GLUCOSE, CAPILLARY
Glucose-Capillary: 157 mg/dL — ABNORMAL HIGH (ref 70–99)
Glucose-Capillary: 143 mg/dL — ABNORMAL HIGH (ref 70–99)
Glucose-Capillary: 111 mg/dL — ABNORMAL HIGH (ref 70–99)
Glucose-Capillary: 121 mg/dL — ABNORMAL HIGH (ref 70–99)
Glucose-Capillary: 114 mg/dL — ABNORMAL HIGH (ref 70–99)
Glucose-Capillary: 126 mg/dL — ABNORMAL HIGH (ref 70–99)

## 2021-07-22 LAB — CBC
HCT: 25.1 % — ABNORMAL LOW (ref 39.0–52.0)
Hemoglobin: 8.2 g/dL — ABNORMAL LOW (ref 13.0–17.0)
MCH: 27 pg (ref 26.0–34.0)
MCHC: 32.7 g/dL (ref 30.0–36.0)
MCV: 82.6 fL (ref 80.0–100.0)
Platelets: 39 10*3/uL — ABNORMAL LOW (ref 150–400)
RBC: 3.04 MIL/uL — ABNORMAL LOW (ref 4.22–5.81)
RDW: 22 % — ABNORMAL HIGH (ref 11.5–15.5)
WBC: 8.7 10*3/uL (ref 4.0–10.5)
nRBC: 0 % (ref 0.0–0.2)

## 2021-07-22 LAB — DIC (DISSEMINATED INTRAVASCULAR COAGULATION)PANEL
D-Dimer, Quant: 0.98 ug/mL-FEU — ABNORMAL HIGH (ref 0.00–0.50)
Fibrinogen: 724 mg/dL — ABNORMAL HIGH (ref 210–475)
INR: 1 (ref 0.8–1.2)
Platelets: 43 10*3/uL — ABNORMAL LOW (ref 150–400)
Prothrombin Time: 13.2 seconds (ref 11.4–15.2)
Smear Review: NONE SEEN
aPTT: 35 seconds (ref 24–36)

## 2021-07-22 LAB — PHOSPHORUS: Phosphorus: 2.5 mg/dL (ref 2.5–4.6)

## 2021-07-22 LAB — COPPER, SERUM: Copper: 105 ug/dL (ref 69–132)

## 2021-07-22 LAB — BASIC METABOLIC PANEL
Anion gap: 9 (ref 5–15)
BUN: 34 mg/dL — ABNORMAL HIGH (ref 6–20)
CO2: 25 mmol/L (ref 22–32)
Calcium: 8.3 mg/dL — ABNORMAL LOW (ref 8.9–10.3)
Chloride: 113 mmol/L — ABNORMAL HIGH (ref 98–111)
Creatinine, Ser: 0.62 mg/dL (ref 0.61–1.24)
GFR, Estimated: >60 mL/min (ref >60)
Glucose, Bld: 155 mg/dL — ABNORMAL HIGH (ref 70–99)
Potassium: 3.6 mmol/L (ref 3.5–5.1)
Sodium: 147 mmol/L — ABNORMAL HIGH (ref 135–145)

## 2021-07-22 LAB — ZINC: Zinc: 42 ug/dL — ABNORMAL LOW (ref 44–115)

## 2021-07-22 LAB — MAGNESIUM: Magnesium: 2.2 mg/dL (ref 1.7–2.4)

## 2021-07-22 MED ORDER — FREE WATER
100.0000 mL | Freq: Four times a day (QID) | Status: DC
Start: 2021-07-22 — End: 2021-07-22
  Administered 2021-07-22: 100 mL

## 2021-07-22 MED ORDER — FREE WATER
200.0000 mL | Freq: Four times a day (QID) | Status: DC
Start: 2021-07-22 — End: 2021-08-04
  Administered 2021-07-22 – 2021-08-04 (×51): 200 mL

## 2021-07-22 MED ORDER — HYDROCORTISONE SOD SUC (PF) 100 MG IJ SOLR
100.0000 mg | INTRAMUSCULAR | Status: DC
  Administered 2021-07-22: 100 mg via INTRAVENOUS
  Filled 2021-07-22: qty 2

## 2021-07-22 NOTE — Procedures (Addendum)
Patient Name: DIARRA KOS  MRN: [id number]  Epilepsy Attending: Lora Havens  Referring Physician/Provider: Lora Havens, MD Duration: 07/20/2021 1329 to 07/21/2021 1329   Patient history: 46yo m with h/o seizure, ams. EEG to evaluate for seizure.    Level of alertness:  lethargic    AEDs during EEG study: LEV, PHT   Technical aspects: This EEG study was done with scalp electrodes positioned according to the 10-20 International system of electrode placement. Electrical activity was acquired at a sampling rate of '500Hz'$  and reviewed with a high frequency filter of '70Hz'$  and a low frequency filter of '1Hz'$ . EEG data were recorded continuously and digitally stored.    Description: EEG showed 6 to 8 Hz theta-alpha activity in right hemisphere and low amplitude 2 to 3 Hz delta slowing in left hemisphere.There were polyspikes in left hemisphere which appear qasi-periodic every few seconds.  Twelve seizures without clinical signs were noted over right frontal region, average duration 3.5 minutes each.  During the seizure, EEG initially showed 9 to 10 Hz alpha activity in right frontal region which then involved all of right hemisphere and left frontal region and evolved in frequency to 5 to 6 Hz theta slowing. Last seizure was noted on 07/21/2021 at 0144.   Hyperventilation and photic stimulation were not performed.      ABNORMALITY -Seizure without clinical signs, right frontal region -Polyspikes, left hemisphere -Continuous slow, generalized and lateralized left hemisphere   IMPRESSION: This study showed twelve seizures without clinical signs arising from right frontal region, average duration 3.67mnutes each. Last seizure was noted on 07/21/2021 at 0144. Additionally there is evidence of epileptogenicity and cortical dysfunction arising from left hemisphere likely due to underlying structural abnormality.  Lastly, There is moderate to severe diffuse encephalopathy, nonspecific etiology.    Mayela Bullard OBarbra Sarks

## 2021-07-22 NOTE — Progress Notes (Signed)
NAME:  Terry Palmer, MRN:  [id number], DOB:  1975-10-14, LOS: 4 ADMISSION DATE:  07/18/2021, CONSULTATION DATE:  07/18/2021 REFERRING MD:  Dr. Regenia Skeeter, CHIEF COMPLAINT:  AMS, bradycardia   History of Present Illness:  46 y/o M who presented to [facility] ER on 7/11 with reports of low UOP.     At baseline, he lives at home with his wife and has a home Doctor, general practice.  He has MS and is bed bound / quad since at least 2013 with PEG in place.  [name] home health RN noted decreased UOP of <200 ml in 48h from his suprapubic catheter.  Almost no UOP on day of presentation. The RN changed the catheter with no change in output. At baseline, he is bed bound and blinks yes at baseline. He has been less alert. Family also reported a "rattling in his chest" for a few days. EMS was activated and patient was found to have a HR of 30.  Pacing was initiated by EMS at a rate of 70.  Initial BP 80/50. On arrival, the patient was activated as a possible sepsis. He was treated with 2.5L IVF. He was hypothermic and bair hugger was placed. The patient was noted to have a 3x3cm wound on the right and a 3x3 wound on the left hip. Per family, his wounds are actually improved.  His temperature on arrival was 82 degrees.  He was intubated in the ER per EDP. Initial labs - Na 131, K 4.6, Cl 97, CO2 26, glucose 95, BUN 64, cr 1.14, alk phos 141, albumin 2.6, AST 51, ALT 77, WBC 3.7, Hgb 9.9, and platelets 113.  ETT in good position on CXR, Basilar infiltrates on left.   While in ICU stay complicated by continued hypercarbic respiratory failure, concern for LLL PNA as well as continued encephalopathy. CXR with cardiomegaly and mild pulmonary vascular congestion, improving L perihilar opacity, and persistent bibasilar opacities. He does not appear volume overloaded however with his hypotension he would not tolerate any type of diuresis. Ammonia and TSH WNL. CT head no acute. 7/12 Neurology consulted. EEG with consideration for  epileptogenicity arising from the left hemisphere. Loaded with 3 g of keppra and continued 1500 mg BID. Cefepime D/C. 7/13 EEG with status epilepticus. Started on dilantin 100 mg TID. Continued goals of care with family. Wish to continue all measures.   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 On vanc, cefepime, metronidazole 7/12 Started zosyn  Interim History / Subjective:  EEG with generalized slowing and lateralized left hemisphere. Off vasopressors this AM    Objective   Blood pressure 105/67, pulse 98, temperature (!) 97.3 F (36.3 C), resp. rate 18, height 6' 4"  (1.93 m), weight 74.7 kg, SpO2 95 %. CVP:  [2 mmHg-24 mmHg] 5 mmHg  Vent Mode: PRVC FiO2 (%):  [40 %] 40 % Set Rate:  [18 bmp] 18 bmp Vt Set:  [500 mL] 500 mL PEEP:  [5 cmH20] 5 cmH20 Pressure Support:  [10 cmH20] 10 cmH20 Plateau Pressure:  [14 cmH20-20 cmH20] 20 cmH20   Intake/Output Summary (Last 24 hours) at 07/22/2021 0801 Last data filed at 07/22/2021  0600 Gross per 24 hour  Intake 1822.52 ml  Output 1300 ml  Net 522.52 ml   Filed Weights   07/20/21 0500 07/21/21 0357 07/22/21 0407  Weight: 75.2 kg 75.3 kg 74.7 kg    Examination: General: Chronically ill appearing gentleman on vent  HENT: ETT in place. Lungs: coarse breath sounds, no crackles/wheeze  Cardiovascular: Regular rate and rhythm. No murmurs, rubs, gallops. Abdomen: Soft, nontender, nondistended. PEG in place  Extremities: Bilateral LE are contracted, without distal edema. Skin: Skin is warm and dry. No lesions or rashes noted. Does have healing sacral wounds, on the R with  ~1MB ovalic open wound that has pink tissue beneath, on the L with small circular opening that has drainage. No increased erythema surrounding these wounds. Neuro: Does not open eyes link, withdrawals uppers with nailbed pressures, Grimaces to pain. Contracted in bilateral UE and LE. +Cough/Gag   Resolved Hospital Problem list   Hypothermia Bradycardia  Assessment & Plan:   Acute Metabolic Encephalopathy Frequent subclinical seizures vs status  Hx of multiple sclerosis Quadriplegic Hx seizure disorder Patient has gag, cough, blink reflexes and response to pain. Family discussion concluded with decision to pursue aggressive seizure suppression measures.  Plan -Neurology Following > Plans to D/C LTM today  -Continue Keppra and Dilantin  -Hold home baclofen, dantrolene, tizanidine  Acute respiratory failure w/hypercarbia with encephalopathy requiring intubation  Sepsis in setting of Possible LLL pneumonia Plan -Continue full vent support > can attempted PS with improved mentation -Continue zosyn for planned 5 days total  -Send Resp Quant   Hypotension in setting of sepsis  Plan -Cardiac Monitoring  -Remains off vasopressors with MAP goal >65  -Decrease solu-cortef from 100 BID to 100 daily   Pancytopenia -Continue to decrease, question related to septic shock, no signs of active bleed, no new lesions or rashes, was on heparin though 4Ts score remains low, lowering suspicion for HIT. He remains in an acute inflammatory state and was septic on admission, so thrombocytopenia could be a delayed response to this. Plan -Trend CBC, transfuse for Hgb <7 and PLTs <20,000 -Hold heparin -Send HIT and DIC panel   Hypernatremia  Plan -Start FW  -Trend BMP   Type 2 diabetes mellitus A1c 5.1%. -CBG, SSI  Neurogenic bladder -Monitor UOP -Avoid nephrotoxic agents, ensure adequate renal perfusion   Mildly elevated LFTs > improving  -Trend CMP   Hx of HTN -Hold clonidine   Sacral  Wound POA Is reportedly improved from prior. On my exam the wounds do not appear acutely infected.  -Wound care -Offload as able   Hx of depression Given encephalopathy, currently holding home regimen. -Continue to hold home xanax, valium, wellbutrin for now  Best Practice (right click and "Reselect all SmartList Selections" daily)   Diet/type: Tube feeds DVT prophylaxis: SCDs GI prophylaxis: PPI Lines: Central line Foley:  N/A Code Status:  full code Last date of multidisciplinary goals of care discussion [07/13]. Ongoing. Consult Palliative Care   Labs   CBC: Recent Labs  Lab 07/18/21 1708 07/18/21 1730 07/19/21 0348 07/19/21 0515 07/19/21 0820 07/20/21 0534 07/21/21 0355 07/22/21 0340  WBC 3.7*  --  7.9  --   --  12.5* 10.2 8.7  NEUTROABS 3.1  --   --   --   --   --   --   --   HGB 9.9*   < > 11.4* 12.2* 10.2* 9.2* 9.0* 8.2*  HCT 33.9*   < > 36.0* 36.0* 30.0* 28.1* 28.5* 25.1*  MCV 90.9  --  84.7  --   --  81.7 83.1 82.6  PLT 113*  --  191  --   --  76* 44* 39*   < > = values in this interval not displayed.    Basic Metabolic Panel: Recent Labs  Lab 07/18/21 1708 07/18/21 1730 07/18/21 1837 07/19/21 0348 07/19/21 0515 07/19/21 0820 07/20/21 0534 07/21/21 0355 07/22/21 0340  NA 131* 132*   < > 137 139 138 142 144 147*  K 4.6 4.6   < > 3.7 3.8 3.9 3.9 4.0 3.6  CL 97*  --   --  106  --   --  110 110 113*  CO2 26  --   --  21*  --   --  22 25 25   GLUCOSE 95  --   --  109*  --   --  132* 207* 155*  BUN 64*  --   --  55*  --   --  35* 35* 34*  CREATININE 1.14 1.30*  --  0.95  --   --  0.78 0.69 0.62  CALCIUM 8.9  --   --  9.3  --   --  8.5* 8.5* 8.3*  MG  --   --   --  2.2  --   --  2.0 2.1 2.2  PHOS  --   --   --  2.6  --   --  1.9* 1.9* 2.5   < > = values in this interval not displayed.   GFR: Estimated Creatinine Clearance: 120.6 mL/min (by C-G formula based on SCr of 0.62 mg/dL). Recent Labs  Lab 07/18/21 1708 07/18/21 2311 07/19/21 0348  07/20/21 0534 07/21/21 0355 07/22/21 0340  PROCALCITON  --  0.19 0.79 2.10  --   --   WBC 3.7*  --  7.9 12.5* 10.2 8.7  LATICACIDVEN 1.0 2.1* 2.0*  --   --   --     Liver Function Tests: Recent Labs  Lab 07/18/21 1708 07/19/21 0348 07/20/21 0534  AST 51* 45* 35  ALT 77* 84* 61*  ALKPHOS 141* 146* 139*  BILITOT 0.2* 0.4 0.5  PROT 6.3* 6.6 5.9*  ALBUMIN 2.6* 2.5* 2.0*   No results for input(s): "LIPASE", "AMYLASE" in the last 168 hours. Recent Labs  Lab 07/18/21 2311  AMMONIA 20    ABG    Component Value Date/Time   PHART 7.407 07/19/2021 0820   PCO2ART 32.8 07/19/2021 0820   PO2ART 73 (L) 07/19/2021 0820   HCO3 21.1 07/19/2021 0820   TCO2 22 07/19/2021 0820   ACIDBASEDEF 4.0 (H) 07/19/2021 0820   O2SAT 96 07/19/2021 0820     Coagulation Profile: Recent Labs  Lab 07/18/21 1708  INR 1.1    Cardiac Enzymes: No results for input(s): "CKTOTAL", "CKMB", "CKMBINDEX", "TROPONINI" in the last 168 hours.  HbA1C: Hgb A1c MFr Bld  Date/Time Value Ref Range Status  07/18/2021 11:11 PM 5.1 4.8 - 5.6 % Final    Comment:    (NOTE) Pre diabetes:          5.7%-6.4%  Diabetes:              >6.4%  Glycemic control for   <7.0% adults with diabetes   08/10/2020 05:42 PM 7.1 (H) 4.8 - 5.6 % Final    Comment:    (NOTE) Pre diabetes:          5.7%-6.4%  Diabetes:              >  6.4%  Glycemic control for   <7.0% adults with diabetes     CBG: Recent Labs  Lab 07/21/21 1530 07/21/21 1924 07/21/21 2312 07/22/21 0318 07/22/21 0710  GLUCAP 157* 103* 128* 157* 143*    Review of Systems:   Unable to obtain due to encephalopathy.  Critical care time: 4    CRITICAL CARE Performed by: Omar Person   Total critical care time: 45 minutes  Critical care time was exclusive of separately billable procedures and treating other patients.  Critical care was necessary to treat or prevent imminent or life-threatening deterioration.  Critical care was  time spent personally by me on the following activities: development of treatment plan with patient and/or surrogate as well as nursing, discussions with consultants, evaluation of patient's response to treatment, examination of patient, obtaining history from patient or surrogate, ordering and performing treatments and interventions, ordering and review of laboratory studies, ordering and review of radiographic studies, pulse oximetry and re-evaluation of patient's condition.  Hayden Pedro, AGACNP-BC [facility]  PCCM Pgr: [phone]

## 2021-07-22 NOTE — Progress Notes (Signed)
eLink Physician-Brief Progress Note Patient Name: Terry Palmer DOB: [birth year] MRN: [id number]   Date of Service  07/22/2021  HPI/Events of Note  Bedside RN asking for [facility] for loose stools.  eICU Interventions  Platelet count is 44 K, relatively contraindicating placement of a Flexiseal.        Kyera Felan U Louretta Tantillo 07/22/2021, 1:42 AM

## 2021-07-22 NOTE — Progress Notes (Signed)
LTM EEG discontinued - no skin breakdown at unhook.   

## 2021-07-22 NOTE — Consult Note (Signed)
Palliative Medicine Inpatient Consult Note  Consulting Provider: Omar Person, NP  Reason for consult:   [facility] Palliative Medicine Consult  Reason for Consult? End of Life   07/22/2021  HPI:  Per intake H&P --> 46 y/o M who presented to [facility] ER on 7/11 with reports of low UOP.  At baseline, he lives at home with his wife and has a home Doctor, general practice.  He has MS and is bed bound / quad since at least 2013 with PEG in place.  [name] home health RN noted decreased UOP of <200 ml in 48h from his suprapubic catheter.  Admitted to the ICU in the setting of sepsis from LLL PNA - requiring mechanical support.  Ongoing seizures causing encephalopathic state.  Palliative care was asked to get involved to further address goals of care in the setting of patient's multiple chronic comorbid conditions and acute illness.  Have been asked more specifically to help identify if family would desire a tracheostomy or not.  Clinical Assessment/Goals of Care:  *Please note that this is a verbal dictation therefore any spelling or grammatical errors are due to the "[place]" system interpretation.  I have reviewed medical records including EPIC notes, labs and imaging, received report from bedside RN, assessed the patient.    I called patient's wife though left a VM and later called patients mother, [name] Ramsay to further discuss diagnosis prognosis, GOC, EOL wishes, disposition and options.   I introduced Palliative Medicine as specialized medical care for people living with serious illness. It focuses on providing relief from the symptoms and stress of a serious illness. The goal is to improve quality of life for both the patient and the family.  Medical History Review and Understanding:  We reviewed that Sherry has suffered from multiple sclerosis for over 20 years now.  Dementia, dysphagia w. A PEG, seizures, suprapubic catheter.  Social History:  Debra  and [name] have been married for more than 22 years. They met in [place]; Quinn is originally from [place] is from [place]. They have 2 adult children (ages 65 and 53) that live in the home with them. He was diagnosed with multiple sclerosis at age 45. 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 He is of the Muslim faith.   Functional and Nutritional State:  Cornelio requires total care-he has a caregiver 4 days a week with him and his family trusts as well as his wife who fills in the sequential 3 days.  He has artificial nutrition through gastrostomy tube.  Advance Directives:  A detailed discussion was had today regarding advanced directives.  Per patient's mother she and patient's wife, gave the decisions collectively.  Code Status:  Reviewed concerns regarding patient's present resuscitation status.  I shared that often can be more difficult once these measures are instituted to start feeling them away.  Discussed that at this time Tarris is intubated.  Patient's mother remains optimistic for him to "breathe on his own".  She shares that he has been breathing over the vent per what her daughter-in-law shared with her yesterday.  At this time the plan will be to continue with full interventions though this will be an ongoing dialogue.  Discussion:  Reviewed that Bryson is identified is a very strong and resilient man.  We discussed his long-term diagnosis and the prognosis associated with it.  Patient's mother expresses that he has been in very tenuous circumstances before and he is "pulled through".  She shares very openly and honestly that if Shannon does not have his mental faculties then she would not want him to continue aggressive measures.  At baseline Mackie is able to open his eyes and blink in response, he is able to track family throughout the room, and he is able to recognize familiar faces  and voices.  We discussed that at this point in time he can do none of the above due to his overt sepsis.  Patient's mother expresses that she would like him to have some more time to see if eventually he will become more coherent.  I reviewed what timeframe would be reasonable from her purview and she shares that she does not want to set a limit on it though if it were not looking reassuring she would desire to hear that from the ICU doctor as well as the neurologist directly.  Discussed the importance of continued conversation with family and their  medical providers regarding overall plan of care and treatment options, ensuring decisions are within the context of the patients values and GOCs. _______________________________ Addendum:  I spoke to [facility] to gain more insights on why he was discharged from their services.  Started August 2022 - bed bound with multiple pressure wounds. Dependent all bADLs then worsened for 3 weeks unable to tolerate tube feedings then he started to improve. Had x12 pressure wounds which decreased down to two. HOP shares they did give antibiotics when needed for a UTI. Apparently, conditions improved vastly therefore it was discussed that he would be discharged from [facility] on May 25th, 2023. Per conversation it is unclear if Palliative care was offered though patient was a DNAR/DNI while under their services.   Decision Maker: Dyann Kief [phone]   [place] Mother [phone]   SUMMARY OF RECOMMENDATIONS   Fulll code --> will continue to discuss in the oncoming days; More specifically if continued to fail weaning trials if a tracheostomy would be desired  Family requests more time to see if Arnav can improve to his neurological baseline of "tracking" --> If he is unable to do this then patient family share they would not want to continue present measures  Was taken off Hospice on May  25th --> had been under services with [facility] since August 2022. Unclear why was not placed on Community Palliative Program  Appreciate Dr. Mariea Clonts of [facility]'s input --> Has been enrolling in [facility] since June 2023  Ongoing support, very complicated situation  Code Status/Advance Care Planning: FULL CODE  Palliative Prophylaxis:  Aspiration, Bowel Regimen, Delirium Protocol, Frequent Pain Assessment, Oral Care, Palliative Wound Care, and Turn Reposition  Additional Recommendations (Limitations, Scope, Preferences): Continue current care for the time being  Psycho-social/Spiritual:  Desire for further Chaplaincy support: Not presently Additional Recommendations: Discussion of disease trajectory   Prognosis: Terribly poor. Hospice is appropriate.  Discharge Planning: Unclear presently.   Vitals:   07/22/21 0800 07/22/21 0815  BP: 100/67   Pulse: 97 95  Resp: 18 18  Temp:    SpO2: 95% 100%    Intake/Output Summary (Last 24 hours) at 07/22/2021 0935 Last data filed at 07/22/2021 0800 Gross per 24 hour  Intake 1864.11 ml  Output 1300 ml  Net 564.11 ml   Last Weight  Most recent update: 07/22/2021  4:07 AM    Weight  74.7 kg (164 lb 10.9 oz)            Gen:  Exceptionally ill appearing M HEENT: ETT, OGT, dry mucous membranes CV: Regular rate and rhythm  PULM: Mechanical ventilatory ABD: (+)GT EXT: Contracted Neuro: Somnolent, no response  PPS: 10%   This conversation/these recommendations were discussed with patient primary care team, Dr. Tacy Learn  Billing based on MDM: High  Problems Addressed: One acute or chronic illness or injury that poses a threat to life or bodily function  Amount and/or Complexity of Data: Category 3:Discussion of management or test interpretation with external physician/other qualified health care professional/appropriate source (not separately reported)  Risks: Decision regarding elective major  surgery with identified patient or procedure risk factors ______________________________________________________ [place] Team Team Cell Phone: [phone] Please utilize secure chat with additional questions, if there is no response within 30 minutes please call the above phone number  Palliative Medicine Team providers are available by phone from 7am to 7pm daily and can be reached through the team cell phone.  Should this patient require assistance outside of these hours, please call the patient's attending physician.

## 2021-07-22 NOTE — Procedures (Signed)
Patient Name: Terry Palmer  MRN: [id number]  Epilepsy Attending: Lora Havens  Referring Physician/Provider: Lora Havens, MD Duration: 07/21/2021 1329 to 07/22/2021  0950   Patient history: 46yo m with h/o seizure, ams. EEG to evaluate for seizure.    Level of alertness:  lethargic    AEDs during EEG study: LEV, PHT   Technical aspects: This EEG study was done with scalp electrodes positioned according to the 10-20 International system of electrode placement. Electrical activity was acquired at a sampling rate of '500Hz'$  and reviewed with a high frequency filter of '70Hz'$  and a low frequency filter of '1Hz'$ . EEG data were recorded continuously and digitally stored.    Description: EEG showed continuous generalized and lateralized left hemisphere 3-'5Hz'$  theta-delta slowing.Marland Kitchen Hyperventilation and photic stimulation were not performed.      ABNORMALITY -Continuous slow, generalized and lateralized left hemisphere   IMPRESSION: This study is suggestive of  cortical dysfunction arising from left hemisphere likely due to underlying structural abnormality. Additionally there is moderate to severe diffuse encephalopathy, nonspecific etiology. No seizures or definite epileptiform discharges were seen during this study.  Whit Bruni [name]

## 2021-07-22 NOTE — Progress Notes (Signed)
Neurology Progress Note   S:// Seen and examined this AM.  No seizures on LTM overnight  O:// Current vital signs: BP 100/67   Pulse 95   Temp (!) 97.3 F (36.3 C)   Resp 18   Ht '6\' 4"'$  (1.93 m)   Wt 74.7 kg   SpO2 99%   BMI 20.05 kg/m  Vital signs in last 24 hours: Temp:  [97.3 F (36.3 C)-98.3 F (36.8 C)] 97.3 F (36.3 C) (07/15 0713) Pulse Rate:  [81-102] 95 (07/15 0815) Resp:  [12-20] 18 (07/15 0815) BP: (72-116)/(46-86) 100/67 (07/15 0800) SpO2:  [90 %-100 %] 99 % (07/15 1006) FiO2 (%):  [40 %] 40 % (07/15 1006) Weight:  [74.7 kg] 74.7 kg (07/15 0407) General: Intubated on no sedation HEENT: Normocephalic atraumatic Lungs: Vented Cardiovascular: Regular rate rhythm Abdomen nondistended nontender Neurological exam Intubated on no sedation No spontaneous movement To noxious stimulation, slowly opens eyes.  Grimaces. Cranial nerves: Pupils are equal sluggishly reactive to light, present corneals, breathing with the ventilator. Motor examination with contractures noted in all 4 extremities with reduction of muscle mass.  No withdrawal to noxious stimulation. Sensory examination: As above  Medications  Current Facility-Administered Medications:    Chlorhexidine Gluconate Cloth 2 % PADS 6 each, 6 each, Topical, Q0600, Candee Furbish, MD, 6 each at 07/22/21 1002   docusate (COLACE) 50 MG/5ML liquid 100 mg, 100 mg, Per Tube, BID PRN, Candee Furbish, MD   feeding supplement (PROSource TF) liquid 45 mL, 45 mL, Per Tube, QID, Candee Furbish, MD, 45 mL at 07/22/21 1003   feeding supplement (VITAL 1.5 CAL) liquid 1,000 mL, 1,000 mL, Per Tube, Continuous, Candee Furbish, MD, Last Rate: 55 mL/hr at 07/22/21 0800, Infusion Verify at 07/22/21 0800   free water 100 mL, 100 mL, Per Tube, Q6H, Eubanks, Danie Chandler, NP   hydrocortisone sodium succinate (SOLU-CORTEF) 100 MG injection 100 mg, 100 mg, Intravenous, Q24H, Eubanks, Katalina M, NP   insulin aspart (novoLOG) injection  0-9 Units, 0-9 Units, Subcutaneous, Q4H, Gleason, Otilio Carpen, PA-C, 1 Units at 07/22/21 0801   ipratropium-albuterol (DUONEB) 0.5-2.5 (3) MG/3ML nebulizer solution 3 mL, 3 mL, Nebulization, Q4H PRN, Rollene Rotunda, John D, PA-C   levETIRAcetam (KEPPRA) IVPB 1500 mg/ 100 mL premix, 1,500 mg, Intravenous, Q12H, Amie Portland, MD, Last Rate: 400 mL/hr at 07/22/21 0941, 1,500 mg at 07/22/21 0941   norepinephrine (LEVOPHED) 16 mg in 264m premix infusion, 0-60 mcg/min, Intravenous, Titrated, SAnders Simmonds MD, Stopped at 07/21/21 2351   Oral care mouth rinse, 15 mL, Mouth Rinse, Q2H, SCandee Furbish MD, 15 mL at 07/22/21 1003   Oral care mouth rinse, 15 mL, Mouth Rinse, PRN, SCandee Furbish MD   pantoprazole sodium (PROTONIX) 40 mg/20 mL oral suspension 40 mg, 40 mg, Per Tube, Daily, Gleason, LOtilio Carpen PA-C, 40 mg at 07/22/21 1003   phenytoin (DILANTIN) injection 100 mg, 100 mg, Intravenous, Q8H, AAmie Portland MD, 100 mg at 07/22/21 0335   piperacillin-tazobactam (ZOSYN) IVPB 3.375 g, 3.375 g, Intravenous, Q8H, DFarrel Gordon DO, Last Rate: 12.5 mL/hr at 07/22/21 1005, 3.375 g at 07/22/21 1005   polyethylene glycol (MIRALAX / GLYCOLAX) packet 17 g, 17 g, Per Tube, Daily PRN, SCandee Furbish MD   sodium chloride flush (NS) 0.9 % injection 10-40 mL, 10-40 mL, Intracatheter, Q12H, Icard, Bradley L, DO, 10 mL at 07/22/21 1004   sodium chloride flush (NS) 0.9 % injection 10-40 mL, 10-40 mL, Intracatheter, PRN, Icard, Bradley L, DO  Labs CBC    Component Value Date/Time   WBC 8.7 07/22/2021 0340   RBC 3.04 (L) 07/22/2021 0340   HGB 8.2 (L) 07/22/2021 0340   HCT 25.1 (L) 07/22/2021 0340   PLT 39 (L) 07/22/2021 0340   MCV 82.6 07/22/2021 0340   MCH 27.0 07/22/2021 0340   MCHC 32.7 07/22/2021 0340   RDW 22.0 (H) 07/22/2021 0340   LYMPHSABS 0.5 (L) 07/18/2021 1708   MONOABS 0.1 07/18/2021 1708   EOSABS 0.0 07/18/2021 1708   BASOSABS 0.0 07/18/2021 1708    CMP     Component Value Date/Time   NA 147 (H)  07/22/2021 0340   K 3.6 07/22/2021 0340   CL 113 (H) 07/22/2021 0340   CO2 25 07/22/2021 0340   GLUCOSE 155 (H) 07/22/2021 0340   BUN 34 (H) 07/22/2021 0340   CREATININE 0.62 07/22/2021 0340   CALCIUM 8.3 (L) 07/22/2021 0340   PROT 5.9 (L) 07/20/2021 0534   ALBUMIN 2.0 (L) 07/20/2021 0534   AST 35 07/20/2021 0534   ALT 61 (H) 07/20/2021 0534   ALKPHOS 139 (H) 07/20/2021 0534   BILITOT 0.5 07/20/2021 0534   GFRNONAA >60 07/22/2021 0340   GFRAA >60 12/10/2017 0442    Imaging I have reviewed images in epic and the results pertinent to this consultation are: Severely advanced atrophy for age on the CT head.  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 Clinical picture is concerning for sepsis.  Given poor baseline reserve and end-stage neurodegenerative process, the sepsis is likely causing this profound encephalopathy and I would expect some improvement although protracted with treatment of the sepsis. That said the EEG also had concern for epileptogenicity from the left hemisphere hence it was deemed prudent to watch him on LTM EEG.  There was some improvement on the EEG after giving him benzodiazepines and an additional load of Keppra.   Hooked up to LTM EEG which showed frequent seizures.  Dilantin added to the second agent.  After addition of the second agent, only 1 seizure noted overnight the night prior and no seizure over the last over 24 hours..  Impression: Frequent subclinical seizures/status epilepticus-resolved Multifactorial toxic metabolic encephalopathy-expect slow resolution given extremely poor brain  reserve  Recommendations: Continue Keppra and Dilantin Maintain seizure precautions Continue correction of toxic metabolic derangements as you are Discontinue LTM EEG Neurology will be available as needed Plan discussed with Hayden Pedro, PCCM provider  -- Amie Portland, MD Neurologist [facility] Pager: [phone]  CRITICAL CARE ATTESTATION Performed by: Amie Portland, MD Total critical care time: 31 minutes Critical care time was exclusive of separately billable procedures and treating other patients and/or supervising APPs/Residents/Students Critical care was necessary to treat or prevent imminent or life-threatening deterioration due to frequent seizures, toxic metabolic encephalopathy This patient is critically ill and at significant risk for neurological worsening and/or death and care requires constant monitoring. Critical care was time spent personally by me on the following activities: development of treatment plan with patient and/or surrogate as well as nursing, discussions with consultants, evaluation of patient's response to treatment, examination of patient, obtaining history from patient or surrogate, ordering and performing treatments and interventions, ordering and review of  laboratory studies, ordering and review of radiographic studies, pulse oximetry, re-evaluation of patient's condition, participation in multidisciplinary rounds and medical decision making of high complexity in the care of this patient.

## 2021-07-22 NOTE — Progress Notes (Signed)
4 episodes of liquid stools, large amount. Attempted rectal pouch without success. Patient has 2 stage 3 wounds and is contracted :Contacted [facility] for flexiseal.

## 2021-07-22 NOTE — Progress Notes (Signed)
LTM maint complete - no skin breakdown  Serviced F7 F4 Cz C3 [facility] monitored, Event button test confirmed by [facility].

## 2021-07-23 ENCOUNTER — Other Ambulatory Visit: Payer: Self-pay

## 2021-07-23 DIAGNOSIS — A419 Sepsis, unspecified organism: Secondary | ICD-10-CM | POA: Diagnosis not present

## 2021-07-23 DIAGNOSIS — R579 Shock, unspecified: Secondary | ICD-10-CM | POA: Diagnosis not present

## 2021-07-23 DIAGNOSIS — J9600 Acute respiratory failure, unspecified whether with hypoxia or hypercapnia: Secondary | ICD-10-CM | POA: Diagnosis not present

## 2021-07-23 DIAGNOSIS — N179 Acute kidney failure, unspecified: Secondary | ICD-10-CM | POA: Diagnosis not present

## 2021-07-23 DIAGNOSIS — Z515 Encounter for palliative care: Secondary | ICD-10-CM | POA: Diagnosis not present

## 2021-07-23 LAB — GLUCOSE, CAPILLARY
Glucose-Capillary: 103 mg/dL — ABNORMAL HIGH (ref 70–99)
Glucose-Capillary: 105 mg/dL — ABNORMAL HIGH (ref 70–99)
Glucose-Capillary: 108 mg/dL — ABNORMAL HIGH (ref 70–99)
Glucose-Capillary: 112 mg/dL — ABNORMAL HIGH (ref 70–99)
Glucose-Capillary: 119 mg/dL — ABNORMAL HIGH (ref 70–99)
Glucose-Capillary: 135 mg/dL — ABNORMAL HIGH (ref 70–99)

## 2021-07-23 LAB — MAGNESIUM: Magnesium: 2.2 mg/dL (ref 1.7–2.4)

## 2021-07-23 LAB — BASIC METABOLIC PANEL
Anion gap: 7 (ref 5–15)
BUN: 30 mg/dL — ABNORMAL HIGH (ref 6–20)
CO2: 25 mmol/L (ref 22–32)
Calcium: 8.3 mg/dL — ABNORMAL LOW (ref 8.9–10.3)
Chloride: 114 mmol/L — ABNORMAL HIGH (ref 98–111)
Creatinine, Ser: 0.5 mg/dL — ABNORMAL LOW (ref 0.61–1.24)
GFR, Estimated: >60 mL/min (ref >60)
Glucose, Bld: 139 mg/dL — ABNORMAL HIGH (ref 70–99)
Potassium: 3.6 mmol/L (ref 3.5–5.1)
Sodium: 146 mmol/L — ABNORMAL HIGH (ref 135–145)

## 2021-07-23 LAB — CBC
HCT: 26.4 % — ABNORMAL LOW (ref 39.0–52.0)
Hemoglobin: 8.3 g/dL — ABNORMAL LOW (ref 13.0–17.0)
MCH: 26.8 pg (ref 26.0–34.0)
MCHC: 31.4 g/dL (ref 30.0–36.0)
MCV: 85.2 fL (ref 80.0–100.0)
Platelets: 49 10*3/uL — ABNORMAL LOW (ref 150–400)
RBC: 3.1 MIL/uL — ABNORMAL LOW (ref 4.22–5.81)
RDW: 21.8 % — ABNORMAL HIGH (ref 11.5–15.5)
WBC: 8.8 10*3/uL (ref 4.0–10.5)
nRBC: 0 % (ref 0.0–0.2)

## 2021-07-23 LAB — PHOSPHORUS: Phosphorus: 2.3 mg/dL — ABNORMAL LOW (ref 2.5–4.6)

## 2021-07-23 LAB — HEPARIN INDUCED PLATELET AB (HIT ANTIBODY): Heparin Induced Plt Ab: 0.063 OD (ref 0.000–0.400)

## 2021-07-23 MED ORDER — SODIUM CHLORIDE 0.9 % IV SOLN: INTRAVENOUS | Status: DC | PRN

## 2021-07-23 MED ORDER — GABAPENTIN 250 MG/5ML PO SOLN
100.0000 mg | Freq: Two times a day (BID) | ORAL | Status: DC
  Administered 2021-07-23 – 2021-08-04 (×25): 100 mg
  Filled 2021-07-23 (×26): qty 2

## 2021-07-23 MED ORDER — POTASSIUM PHOSPHATES 15 MMOLE/5ML IV SOLN
15.0000 mmol | Freq: Once | INTRAVENOUS | Status: AC
  Administered 2021-07-23: 15 mmol via INTRAVENOUS
  Filled 2021-07-23: qty 5

## 2021-07-23 MED ORDER — BACLOFEN 10 MG PO TABS
20.0000 mg | ORAL_TABLET | Freq: Four times a day (QID) | ORAL | Status: DC
Start: 2021-07-23 — End: 2021-08-04
  Administered 2021-07-23 – 2021-08-04 (×48): 20 mg
  Filled 2021-07-23 (×49): qty 2

## 2021-07-23 MED ORDER — GABAPENTIN 100 MG PO CAPS: 100.0000 mg | ORAL_CAPSULE | Freq: Two times a day (BID) | ORAL | Status: DC

## 2021-07-23 NOTE — Progress Notes (Signed)
[facility] ADULT ICU REPLACEMENT PROTOCOL   The patient does apply for the [facility] Adult ICU Electrolyte Replacment Protocol based on the criteria listed below:   1.Exclusion criteria: TCTS patients, ECMO patients, and Dialysis patients 2. Is GFR >/= 30 ml/min? Yes.    Patient's GFR today is >60 3. Is SCr </= 2? Yes.   Patient's SCr is 0.50 mg/dL 4. Did SCr increase >/= 0.5 in 24 hours? No. 5.Pt's weight >40kg  Yes.   6. Abnormal electrolyte(s): K+3.6, Phos 2.3  7. Electrolytes replaced per protocol 8.  Call MD STAT for K+ </= 2.5, Phos </= 1, or Mag </= 1 Physician:  Shari Heritage 07/23/2021 5:38 AM

## 2021-07-23 NOTE — Progress Notes (Addendum)
NAME:  Terry Palmer, MRN:  [id number], DOB:  10/05/1975, LOS: 5 ADMISSION DATE:  07/18/2021, CONSULTATION DATE:  07/18/2021 REFERRING MD:  Dr. Regenia Skeeter, CHIEF COMPLAINT:  AMS, bradycardia   History of Present Illness:  46 y/o M who presented to [facility] ER on 7/11 with reports of low UOP.     At baseline, he lives at home with his wife and has a home Doctor, general practice.  He has MS and is bed bound / quad since at least 2013 with PEG in place.  [name] home health RN noted decreased UOP of <200 ml in 48h from his suprapubic catheter.  Almost no UOP on day of presentation. The RN changed the catheter with no change in output. At baseline, he is bed bound and blinks yes at baseline. He has been less alert. Family also reported a "rattling in his chest" for a few days. EMS was activated and patient was found to have a HR of 30.  Pacing was initiated by EMS at a rate of 70.  Initial BP 80/50. On arrival, the patient was activated as a possible sepsis. He was treated with 2.5L IVF. He was hypothermic and bair hugger was placed. The patient was noted to have a 3x3cm wound on the right and a 3x3 wound on the left hip. Per family, his wounds are actually improved.  His temperature on arrival was 82 degrees.  He was intubated in the ER per EDP. Initial labs - Na 131, K 4.6, Cl 97, CO2 26, glucose 95, BUN 64, cr 1.14, alk phos 141, albumin 2.6, AST 51, ALT 77, WBC 3.7, Hgb 9.9, and platelets 113.  ETT in good position on CXR, Basilar infiltrates on left.   While in ICU stay complicated by continued hypercarbic respiratory failure, concern for LLL PNA as well as continued encephalopathy. CXR with cardiomegaly and mild pulmonary vascular congestion, improving L perihilar opacity, and persistent bibasilar opacities. He does not appear volume overloaded however with his hypotension he would not tolerate any type of diuresis. Ammonia and TSH WNL. CT head no acute. 7/12 Neurology consulted. EEG with consideration for  epileptogenicity arising from the left hemisphere. Loaded with 3 g of keppra and continued 1500 mg BID. Cefepime D/C. 7/13 EEG with status epilepticus. Started on dilantin 100 mg TID. Continued goals of care with family. Wish to continue all measures.   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 On vanc, cefepime, metronidazole 7/12 Started zosyn 7/16 No further seizures on LTM in last 24hrs so d/c'd, on Zosyn, weaning of vent and off pressors  Interim History / Subjective:  LTM off, no pressor requirement, does not appear to be back at neurologic baseline, is not tracking, able to tolerate SBT Met with palliative yesterday, family wants more time to see if patient will return to baseline   Objective   Blood pressure 132/82, pulse 93, temperature 97.9 F (36.6 C), temperature source Esophageal, resp. rate (!) 23, height 6' 4"  (1.93 m), weight 75.1 kg, SpO2 99 %. CVP:  [1 mmHg-9 mmHg] 4 mmHg  Vent Mode: PSV;CPAP FiO2 (%):  [40 %] 40 % Set Rate:  [26  bmp] 18 bmp Vt Set:  [500 mL] 500 mL PEEP:  [5 cmH20] 5 cmH20 Pressure Support:  [8 cmH20-10 cmH20] 8 cmH20 Plateau Pressure:  [13 cmH20-16 cmH20] 15 cmH20   Intake/Output Summary (Last 24 hours) at 07/23/2021 4174 Last data filed at 07/23/2021 0814 Gross per 24 hour  Intake 1574.53 ml  Output 1900 ml  Net -325.47 ml    Filed Weights   07/21/21 0357 07/22/21 0407 07/23/21 0500  Weight: 75.3 kg 74.7 kg 75.1 kg     General:  contracted, chronically and acutely ill-appearing M HEENT: MM pink/moist, sclera anicteric  Neuro: not on sedation, blinks slightly to  stimulation, not tracking or otherwise responding, triggering vent, pupils equal, + cough CV: s1s2 rrr, no m/r/g PULM:  mechanically ventilated without significant wheezing or rhonchi, on PSV 8/5 GI: soft, non-distended, G-tube in place  Extremities: warm/dry, healing sacral wounds noted previously, severely contracted, no edema  Skin: no rashes or lesions   Labs reviewed: Na 146 Creatinine 0.50 WBC 8.3 Platelets 49  Resolved Hospital Problem list   Hypothermia Bradycardia  Assessment & Plan:   Acute Metabolic Encephalopathy Frequent subclinical seizures vs status  Hx of multiple sclerosis Quadriplegic Hx seizure disorder Patient has gag, cough, blink reflexes and response to pain. Family discussion concluded with decision to pursue aggressive seizure suppression measures.  Plan -Appreciate Neurology recs, LTM d/c'd after >24hrs without sz activity, continue seizure precautions,  Keppra and Dilantin  -resume home baclofen, Neurontin -continue [place] discussions with family, mental status precluding extubation today  Acute respiratory failure w/hypercarbia with encephalopathy requiring intubation  Sepsis in setting of Proteus UTI and Possible LLL pneumonia Plan -Continue full vent support > can attempted PS with improved mentation -Family wants more time on full support before further GOC -Continue zosyn for planned 5 days total  -Send Resp Quant   Hypotension in setting of sepsis  Plan -improved off pressors  Pancytopenia Now improving, 49 today,  no schistocytes, fibrinogen high doubt DIC and likely secondary to sepsis/critical illness Plan -continue to trend CBC, transfuse for Hgb <7 and PLTs <20,000 -Hold heparin -HIT panel pending  Hypernatremia  Plan -continue FW  -Trend BMP   Type 2 diabetes mellitus A1c 5.1%. -continue CBG, SSI  Neurogenic bladder -suprapubic catheter in place -Monitor UOP -Avoid nephrotoxic agents, ensure adequate renal perfusion    Mildly elevated LFTs > improving  -Trend CMP intermittently    Hx of HTN -Hold clonidine   Sacral Wound POA Is reportedly improved from prior. On my exam the wounds do not appear acutely infected.  -Wound care -Offload as able   Hx of depression Given encephalopathy, currently holding home regimen. -Continue to hold home xanax, valium, wellbutrin for now  Best Practice (right click and "Reselect all SmartList Selections" daily)   Diet/type: Tube feeds DVT prophylaxis: SCDs GI prophylaxis: PPI Lines: Central line Foley:  N/A Code Status:  full code Last date of multidisciplinary goals of care discussion [07/15]. Ongoing. Consult Palliative Care involved  Labs   CBC: Recent Labs  Lab 07/18/21 1708 07/18/21 1730 07/19/21 0348 07/19/21 0515 07/19/21 0820 07/20/21 0534 07/21/21 0355 07/22/21 0340 07/22/21 0843 07/23/21 0355  WBC 3.7*  --  7.9  --   --  12.5* 10.2 8.7  --  8.8  NEUTROABS 3.1  --   --   --   --   --   --   --   --   --   HGB 9.9*   < > 11.4*   < >  10.2* 9.2* 9.0* 8.2*  --  8.3*  HCT 33.9*   < > 36.0*   < > 30.0* 28.1* 28.5* 25.1*  --  26.4*  MCV 90.9  --  84.7  --   --  81.7 83.1 82.6  --  85.2  PLT 113*  --  191  --   --  76* 44* 39* 43* 49*   < > = values in this interval not displayed.     Basic Metabolic Panel: Recent Labs  Lab 07/19/21 0348 07/19/21 0515 07/19/21 0820 07/20/21 0534 07/21/21 0355 07/22/21 0340 07/23/21 0355  NA 137   < > 138 142 144 147* 146*  K 3.7   < > 3.9 3.9 4.0 3.6 3.6  CL 106  --   --  110 110 113* 114*  CO2 21*  --   --  22 25 25 25   GLUCOSE 109*  --   --  132* 207* 155* 139*  BUN 55*  --   --  35* 35* 34* 30*  CREATININE 0.95  --   --  0.78 0.69 0.62 0.50*  CALCIUM 9.3  --   --  8.5* 8.5* 8.3* 8.3*  MG 2.2  --   --  2.0 2.1 2.2 2.2  PHOS 2.6  --   --  1.9* 1.9* 2.5 2.3*   < > = values in this interval not displayed.    GFR: Estimated Creatinine Clearance: 121.3 mL/min (A) (by C-G formula based on SCr of  0.5 mg/dL (L)). Recent Labs  Lab 07/18/21 1708 07/18/21 2311 07/19/21 0348 07/20/21 0534 07/21/21 0355 07/22/21 0340 07/23/21 0355  PROCALCITON  --  0.19 0.79 2.10  --   --   --   WBC 3.7*  --  7.9 12.5* 10.2 8.7 8.8  LATICACIDVEN 1.0 2.1* 2.0*  --   --   --   --      Liver Function Tests: Recent Labs  Lab 07/18/21 1708 07/19/21 0348 07/20/21 0534  AST 51* 45* 35  ALT 77* 84* 61*  ALKPHOS 141* 146* 139*  BILITOT 0.2* 0.4 0.5  PROT 6.3* 6.6 5.9*  ALBUMIN 2.6* 2.5* 2.0*    No results for input(s): "LIPASE", "AMYLASE" in the last 168 hours. Recent Labs  Lab 07/18/21 2311  AMMONIA 20     ABG    Component Value Date/Time   PHART 7.407 07/19/2021 0820   PCO2ART 32.8 07/19/2021 0820   PO2ART 73 (L) 07/19/2021 0820   HCO3 21.1 07/19/2021 0820   TCO2 22 07/19/2021 0820   ACIDBASEDEF 4.0 (H) 07/19/2021 0820   O2SAT 96 07/19/2021 0820     Coagulation Profile: Recent Labs  Lab 07/18/21 1708 07/22/21 0843  INR 1.1 1.0     Cardiac Enzymes: No results for input(s): "CKTOTAL", "CKMB", "CKMBINDEX", "TROPONINI" in the last 168 hours.  HbA1C: Hgb A1c MFr Bld  Date/Time Value Ref Range Status  07/18/2021 11:11 PM 5.1 4.8 - 5.6 % Final    Comment:    (NOTE) Pre diabetes:          5.7%-6.4%  Diabetes:              >6.4%  Glycemic control for   <7.0% adults with diabetes   08/10/2020 05:42 PM 7.1 (H) 4.8 - 5.6 % Final    Comment:    (NOTE) Pre diabetes:          5.7%-6.4%  Diabetes:              >  6.4%  Glycemic control for   <7.0% adults with diabetes     CBG: Recent Labs  Lab 07/22/21 1518 07/22/21 1920 07/22/21 2322 07/23/21 0320 07/23/21 0736  GLUCAP 121* 114* 126* 135* 119*     Review of Systems:   Unable to obtain due to encephalopathy.  Critical care time: 35 minutes   CRITICAL CARE Performed by: Otilio Carpen Billiejean Schimek   Total critical care time: 35 minutes  Critical care time was exclusive of separately billable procedures and  treating other patients.  Critical care was necessary to treat or prevent imminent or life-threatening deterioration.  Critical care was time spent personally by me on the following activities: development of treatment plan with patient and/or surrogate as well as nursing, discussions with consultants, evaluation of patient's response to treatment, examination of patient, obtaining history from patient or surrogate, ordering and performing treatments and interventions, ordering and review of laboratory studies, ordering and review of radiographic studies, pulse oximetry and re-evaluation of patient's condition.  Otilio Carpen Daniyah Fohl, PA-C [facility] See Amion for pager If no response to pager , please call [phone] until 7pm After 7:00 pm call [facility]  [phone]?[place]

## 2021-07-23 NOTE — Progress Notes (Signed)
Palliative Medicine Inpatient Follow Up Note  HPI: 46 y/o M who presented to [facility] ER on 7/11 with reports of low UOP.  At baseline, he lives at home with his wife and has a home Doctor, general practice.  He has MS and is bed bound / quad since at least 2013 with PEG in place.  [name] home health RN noted decreased UOP of <200 ml in 48h from his suprapubic catheter.  Admitted to the ICU in the setting of sepsis from LLL PNA - requiring mechanical support.  Ongoing seizures causing encephalopathic state.  Palliative care was asked to get involved to further address goals of care in the setting of patient's multiple chronic comorbid conditions and acute illness.  Have been asked more specifically to help identify if family would desire a tracheostomy or not.  Today's Discussion 07/23/2021  *Please note that this is a verbal dictation therefore any spelling or grammatical errors are due to the "[employer]" system interpretation.  Chart reviewed inclusive of vital signs, progress notes, laboratory results, and diagnostic images.   I met with Quillian Quince and his RN, Shinita at bedside this morning. Per Rhina Brackett continues to wean off of the vent and was able to open his eyes when his name was called today.   Upon my assessment at bedside, Kaye did not open his eyes for me though he had just been cleaned up so may gotten fatigues from this per nursing.   I called patients mother. [name] and update her. She remains to hope for I[place]pa to return to his baseline though is open to additional conversation if he is unable to respond through use of his eyes to name or track as this would be a neurological decline.  At this moment, Lorraine Lax is focused on the improvements Massiah has made. She would like to continue to allow time for outcomes and make no other major decisions until a few days have passed on the present measures to further see what progress can be made.  Questions and concerns addressed/Palliative  Support Provided   Objective Assessment: Vital Signs Vitals:   07/23/21 1128 07/23/21 1130  BP:  126/82  Pulse:  87  Resp:  15  Temp: (!) 97.3 F (36.3 C)   SpO2:  100%    Intake/Output Summary (Last 24 hours) at 07/23/2021 1208 Last data filed at 07/23/2021 1100 Gross per 24 hour  Intake 1219.4 ml  Output 2250 ml  Net -1030.6 ml   Last Weight  Most recent update: 07/23/2021  5:41 AM    Weight  75.1 kg (165 lb 9.1 oz)            Gen:  Exceptionally ill appearing M HEENT: ETT, OGT, dry mucous membranes CV: Regular rate and rhythm  PULM: Mechanical ventilatory ABD: (+)GT EXT: Contracted Neuro: Somnolent,  SUMMARY OF RECOMMENDATIONS    [place]ulll code     Allow time for outcomes   Was taken off Hospice on May 25th --> had been under services with [facility] since August 2022. Unclear why was not placed on [facility]   Appreciate Dr. Mariea Clonts of [facility]'s input --> Has been enrolling in [facility] since June 2023, per Dr. Mariea Clonts appropriate for hospice but ongoing conversations with family. They have not been ready   Ongoing incremental support support, very complicated situation  Billing based on MDM: High  Problems Addressed: One acute or chronic illness or injury that poses a threat to life  or bodily function  Amount and/or Complexity of Data: Category 3:Discussion of management or test interpretation with external physician/other qualified health care professional/appropriate source (not separately reported)  Risks: Decision regarding hospitalization or escalation of hospital care ______________________________________________________________________________________ [place] Team Team Cell Phone: [phone] Please utilize secure chat with additional questions, if there is no response within 30 minutes please call the above phone number  Palliative Medicine Team providers are  available by phone from 7am to 7pm daily and can be reached through the team cell phone.  Should this patient require assistance outside of these hours, please call the patient's attending physician.

## 2021-07-24 DIAGNOSIS — J9600 Acute respiratory failure, unspecified whether with hypoxia or hypercapnia: Secondary | ICD-10-CM | POA: Diagnosis not present

## 2021-07-24 DIAGNOSIS — A419 Sepsis, unspecified organism: Secondary | ICD-10-CM | POA: Diagnosis not present

## 2021-07-24 DIAGNOSIS — J189 Pneumonia, unspecified organism: Secondary | ICD-10-CM | POA: Diagnosis not present

## 2021-07-24 DIAGNOSIS — R6521 Severe sepsis with septic shock: Secondary | ICD-10-CM | POA: Diagnosis not present

## 2021-07-24 LAB — BASIC METABOLIC PANEL
Anion gap: 8 (ref 5–15)
BUN: 26 mg/dL — ABNORMAL HIGH (ref 6–20)
CO2: 25 mmol/L (ref 22–32)
Calcium: 8.2 mg/dL — ABNORMAL LOW (ref 8.9–10.3)
Chloride: 113 mmol/L — ABNORMAL HIGH (ref 98–111)
Creatinine, Ser: 0.5 mg/dL — ABNORMAL LOW (ref 0.61–1.24)
GFR, Estimated: >60 mL/min (ref >60)
Glucose, Bld: 110 mg/dL — ABNORMAL HIGH (ref 70–99)
Potassium: 3.8 mmol/L (ref 3.5–5.1)
Sodium: 146 mmol/L — ABNORMAL HIGH (ref 135–145)

## 2021-07-24 LAB — CBC
HCT: 24.8 % — ABNORMAL LOW (ref 39.0–52.0)
Hemoglobin: 7.8 g/dL — ABNORMAL LOW (ref 13.0–17.0)
MCH: 27.1 pg (ref 26.0–34.0)
MCHC: 31.5 g/dL (ref 30.0–36.0)
MCV: 86.1 fL (ref 80.0–100.0)
Platelets: 63 10*3/uL — ABNORMAL LOW (ref 150–400)
RBC: 2.88 MIL/uL — ABNORMAL LOW (ref 4.22–5.81)
RDW: 21.6 % — ABNORMAL HIGH (ref 11.5–15.5)
WBC: 9.6 10*3/uL (ref 4.0–10.5)
nRBC: 0 % (ref 0.0–0.2)

## 2021-07-24 LAB — MAGNESIUM: Magnesium: 2.1 mg/dL (ref 1.7–2.4)

## 2021-07-24 LAB — GLUCOSE, CAPILLARY
Glucose-Capillary: 105 mg/dL — ABNORMAL HIGH (ref 70–99)
Glucose-Capillary: 86 mg/dL (ref 70–99)
Glucose-Capillary: 115 mg/dL — ABNORMAL HIGH (ref 70–99)
Glucose-Capillary: 123 mg/dL — ABNORMAL HIGH (ref 70–99)
Glucose-Capillary: 72 mg/dL (ref 70–99)
Glucose-Capillary: 106 mg/dL — ABNORMAL HIGH (ref 70–99)

## 2021-07-24 LAB — PHOSPHORUS: Phosphorus: 2.8 mg/dL (ref 2.5–4.6)

## 2021-07-24 MED ORDER — JEVITY 1.2 CAL PO LIQD
1000.0000 mL | ORAL | Status: DC
  Administered 2021-07-24 – 2021-08-04 (×11): 1000 mL
  Filled 2021-07-24 (×20): qty 1000

## 2021-07-24 MED ORDER — ZINC SULFATE 220 (50 ZN) MG PO CAPS
220.0000 mg | ORAL_CAPSULE | Freq: Every day | ORAL | Status: DC
Start: 2021-07-24 — End: 2021-08-04
  Administered 2021-07-24 – 2021-08-04 (×12): 220 mg
  Filled 2021-07-24 (×12): qty 1

## 2021-07-24 NOTE — Progress Notes (Addendum)
NAME:  Terry Palmer, MRN:  [id number], DOB:  1975-01-09, LOS: 6 ADMISSION DATE:  07/18/2021, CONSULTATION DATE:  07/18/2021 REFERRING MD:  Dr. Regenia Skeeter, CHIEF COMPLAINT:  AMS, bradycardia   History of Present Illness:  46 y/o M who presented to [facility] ER on 7/11 with reports of low UOP.     At baseline, he lives at home with his wife and has a home Doctor, general practice.  He has MS and is bed bound / quad since at least 2013 with PEG in place.  [name] home health RN noted decreased UOP of <200 ml in 48h from his suprapubic catheter.  Almost no UOP on day of presentation. The RN changed the catheter with no change in output. At baseline, he is bed bound and blinks yes at baseline. He has been less alert. Family also reported a "rattling in his chest" for a few days. EMS was activated and patient was found to have a HR of 30.  Pacing was initiated by EMS at a rate of 70.  Initial BP 80/50. On arrival, the patient was activated as a possible sepsis. He was treated with 2.5L IVF. He was hypothermic and bair hugger was placed. The patient was noted to have a 3x3cm wound on the right and a 3x3 wound on the left hip. Per family, his wounds are actually improved.  His temperature on arrival was 82 degrees.  He was intubated in the ER per EDP. Initial labs - Na 131, K 4.6, Cl 97, CO2 26, glucose 95, BUN 64, cr 1.14, alk phos 141, albumin 2.6, AST 51, ALT 77, WBC 3.7, Hgb 9.9, and platelets 113.  ETT in good position on CXR, Basilar infiltrates on left.   While in ICU stay complicated by continued hypercarbic respiratory failure, concern for LLL PNA as well as continued encephalopathy. CXR with cardiomegaly and mild pulmonary vascular congestion, improving L perihilar opacity, and persistent bibasilar opacities. He does not appear volume overloaded however with his hypotension he would not tolerate any type of diuresis. Ammonia and TSH WNL. CT head no acute. 7/12 Neurology consulted. EEG with consideration for  epileptogenicity arising from the left hemisphere. Loaded with 3 g of keppra and continued 1500 mg BID. Cefepime D/C. 7/13 EEG with status epilepticus. Started on dilantin 100 mg TID. Continued goals of care with family. Wish to continue all measures.   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 On vanc, cefepime, metronidazole 7/12 Started zosyn 7/16 No further seizures on LTM in last 24hrs so d/c'd, on Zosyn, weaning of vent and off pressors  Interim History / Subjective:  Patient blinks this morning which is his apparent neurological baseline. Does not follow commands however or respond to pain. Remains on full vent support.  Objective   Blood pressure 120/75, pulse (!) 102, temperature 98.6 F (37 C), temperature source Esophageal, resp. rate 18, height 6' 4" (1.93 m), weight 73.5 kg, SpO2 98 %. CVP:  [1 mmHg-11 mmHg] 11 mmHg  Vent Mode: PRVC FiO2 (%):  [40 %] 40 % Set Rate:  [18 bmp] 18 bmp Vt Set:  [500 mL] 500 mL PEEP:  [5 cmH20]  5 cmH20 Pressure Support:  [8 cmH20] 8 cmH20 Plateau Pressure:  [12 cmH20-19 cmH20] 19 cmH20   Intake/Output Summary (Last 24 hours) at 07/24/2021 0827 Last data filed at 07/24/2021 0800 Gross per 24 hour  Intake 1807.56 ml  Output 2675 ml  Net -867.44 ml    Filed Weights   07/22/21 0407 07/23/21 0500 07/24/21 0413  Weight: 74.7 kg 75.1 kg 73.5 kg    Constitutional:Chronically-ill appearing M in no acute distress. Cardio:Regular rate and rhythm. No murmurs, rubs, or gallops. Pulm:Decreased breath sounds over RUL with rhonchi over RML and RLL; coarse breath sounds on  L. Abdomen:Soft, nontender, nondistended. JGG:EZMOQHUT contracted extremities with 0-1+ pitting edema on bilateral hands and feet. Skin:Warm and dry. No rashes or lesions noted. Neuro:Blinks but does not follow commands, no response to pain.   Resolved Hospital Problem list   Hypothermia Bradycardia  Assessment & Plan:   Acute Metabolic Encephalopathy Status epilepticus  Hx seizure disorder Hx of multiple sclerosis Quadriplegic Back at apparent baseline with blinking however he does not follow commands, does not respond to pain. Family would like to give him more time to return to baseline at this time. -Neurology consulted, appreciate their recommendations  -Continue seizure precautions  -Continue keppra, dilantin -Continue home baclofen, Neurontin -Continue GOC with family. Unfortunately prognosis is poor at this time and procedures to enable him to go home like tracheostomy would not be appropriate given his overall disease burden.  Acute respiratory failure w/hypercarbia with encephalopathy requiring intubation  Sepsis in setting of Proteus UTI and Possible LLL pneumonia Plan Patient has required increases in FiO2 to 60% to maintain O2 saturations >90% but largely tolerating FiO2 40%. New on exam this morning is decreased breath sounds over RUL with rhonchi over RML and RLL; L lung sounds are course. His clinical picture is not convincing of volume overload and there is not an indication for diuresis at this time. He has completed a 5-day course of Zoysn for LLL pneumonia and urine culture grew 2000 cultures of Proteus. Respiratory culture has been reincubated for better growth. -Continue full vent support; mental status is not adequate for extubation at this time -Tracheostomy would not be appropriate for this patient -F/u respiratory culture -Continue GOC conversations  Hypotension in setting of sepsis, resolved Resolved. BP stable.  Pancytopenia Platelets continue to  improve, now 63. Interestingly however Hgb is down to 7.8, Hct 24.8. No signs of bleeding at this time and no new rashes or lesions. HIT antibody negative. Given down-trend in Hct I suspect his Hgb is falsely low due to hemodilution. -Trend CBC, transfuse for Hgb <7, PLTs <20,000 -Continue to hold heparin  Hypernatremia  Minimal elevation of Na this morning to 146. Free water deficit calculated at 1.9L. He is getting free water flushes and Na has been stable WNL over last several days. -Continue FW -Trend BMP  Type 2 diabetes mellitus A1c 5.1%. -Continue CBG, SSI  Neurogenic bladder UOP is adequate at this time with suprapubic catheter in place. -Monitor UOP -Avoid nephrotoxic agents, ensure adequate renal perfusion   Mildly elevated LFTs > improving  -Trend CMP intermittently    Hx of HTN -Hold clonidine   Sacral Wound POA Is reportedly improved from prior. On my exam the wounds do not appear acutely infected.  -Wound care -Offload as able   Hx of depression Given encephalopathy, currently holding home regimen. -Continue to hold home xanax, valium, wellbutrin for now  Best Practice (right click and "Reselect all SmartList Selections" daily)  Diet/type: Tube feeds DVT prophylaxis: SCDs GI prophylaxis: PPI Lines: Central line Foley:  N/A Code Status:  full code Last date of multidisciplinary goals of care discussion [07/15]. Ongoing. Consult Palliative Care involved  Labs   CBC: Recent Labs  Lab 07/18/21 1708 07/18/21 1730 07/20/21 0534 07/21/21 0355 07/22/21 0340 07/22/21 0843 07/23/21 0355 07/24/21 0414  WBC 3.7*   < > 12.5* 10.2 8.7  --  8.8 9.6  NEUTROABS 3.1  --   --   --   --   --   --   --   HGB 9.9*   < > 9.2* 9.0* 8.2*  --  8.3* 7.8*  HCT 33.9*   < > 28.1* 28.5* 25.1*  --  26.4* 24.8*  MCV 90.9   < > 81.7 83.1 82.6  --  85.2 86.1  PLT 113*   < > 76* 44* 39* 43* 49* 63*   < > = values in this interval not displayed.     Basic Metabolic  Panel: Recent Labs  Lab 07/20/21 0534 07/21/21 0355 07/22/21 0340 07/23/21 0355 07/24/21 0414  NA 142 144 147* 146* 146*  K 3.9 4.0 3.6 3.6 3.8  CL 110 110 113* 114* 113*  CO2 _0 GLUCOSE 132* 207* 155* 139* 110*  BUN 35* 35* 34* 30* 26*  CREATININE 0.78 0.69 0.62 0.50* 0.50*  CALCIUM 8.5* 8.5* 8.3* 8.3* 8.2*  MG 2.0 2.1 2.2 2.2 2.1  PHOS 1.9* 1.9* 2.5 2.3* 2.8    GFR: Estimated Creatinine Clearance: 118.7 mL/min (A) (by C-G formula based on SCr of 0.5 mg/dL (L)). Recent Labs  Lab 07/18/21 1708 07/18/21 2311 07/19/21 0348 07/20/21 0534 07/21/21 0355 07/22/21 0340 07/23/21 0355 07/24/21 0414  PROCALCITON  --  0.19 0.79 2.10  --   --   --   --   WBC 3.7*  --  7.9 12.5* 10.2 8.7 8.8 9.6  LATICACIDVEN 1.0 2.1* 2.0*  --   --   --   --   --      Liver Function Tests: Recent Labs  Lab 07/18/21 1708 07/19/21 0348 07/20/21 0534  AST 51* 45* 35  ALT 77* 84* 61*  ALKPHOS 141* 146* 139*  BILITOT 0.2* 0.4 0.5  PROT 6.3* 6.6 5.9*  ALBUMIN 2.6* 2.5* 2.0*    No results for input(s): "LIPASE", "AMYLASE" in the last 168 hours. Recent Labs  Lab 07/18/21 2311  AMMONIA 20     ABG    Component Value Date/Time   PHART 7.407 07/19/2021 0820   PCO2ART 32.8 07/19/2021 0820   PO2ART 73 (L) 07/19/2021 0820   HCO3 21.1 07/19/2021 0820   TCO2 22 07/19/2021 0820   ACIDBASEDEF 4.0 (H) 07/19/2021 0820   O2SAT 96 07/19/2021 0820     Coagulation Profile: Recent Labs  Lab 07/18/21 1708 07/22/21 0843  INR 1.1 1.0     Cardiac Enzymes: No results for input(s): "CKTOTAL", "CKMB", "CKMBINDEX", "TROPONINI" in the last 168 hours.  HbA1C: Hgb A1c MFr Bld  Date/Time Value Ref Range Status  07/18/2021 11:11 PM 5.1 4.8 - 5.6 % Final    Comment:    (NOTE) Pre diabetes:          5.7%-6.4%  Diabetes:              >6.4%  Glycemic control for   <7.0% adults with diabetes   08/10/2020 05:42 PM 7.1 (H) 4.8 - 5.6 % Final    Comment:    (  NOTE) Pre diabetes:           5.7%-6.4%  Diabetes:              >6.4%  Glycemic control for   <7.0% adults with diabetes     CBG: Recent Labs  Lab 07/23/21 1525 07/23/21 2028 07/23/21 2343 07/24/21 0409 07/24/21 0744  GLUCAP 108* 112* 105* 105* 86     Review of Systems:   Unable to obtain due to encephalopathy.  Critical care time: 35 minutes   CRITICAL CARE Performed by: Farrel Gordon

## 2021-07-24 NOTE — Progress Notes (Signed)
Nutrition Follow-up  DOCUMENTATION CODES:   Not applicable  INTERVENTION:   Tube feeding via PEG: Change to Jevity 1.2 at 65 ml/h (1560 ml per day) Prosource TF 45 ml QID  Provides 2032 kcal, 130 gm protein, 1264 ml free water daily.  Free water flushes 200 ml every 6 hours for a total of 2064 ml daily.   Continue Juven 1 packet BID via tube, each packet provides 80 calories, 8 grams of carbohydrate, 2.5  grams of protein (collagen), 7 grams of L-arginine and 7 grams of L-glutamine; supplement contains CaHMB, Vitamins C, E, B12 and Zinc to promote wound healing.   Continue MVI with minerals daily via tube.  Add Zinc sulfate 220 mg daily x 14 days for repletion, discussed with CCM physician resident.   NUTRITION DIAGNOSIS:   Increased nutrient needs related to wound healing as evidenced by estimated needs.  Ongoing   GOAL:   Patient will meet greater than or equal to 90% of their needs  Met with TF  MONITOR:   Vent status, TF tolerance, Labs, Skin  REASON FOR ASSESSMENT:   Ventilator, Consult Enteral/tube feeding initiation and management  ASSESSMENT:   46 yo male admitted with AMS, bradycardia, low UOP. PMH includes multiple sclerosis, quadriparesis, bed bound, PEG, recurrent UTI, dysphagia, dementia, seizure disorder.  Discussed patient in ICU rounds and with RN today. Patient is receiving TF via PEG, tolerating well. Having loose stools. Rectal tube in place. Receiving free water flushes 200 ml every 6 hours.   Vitamin / Mineral Panel (7/12): Vitamin B1 (thiamine) 247.9 (H) Vitamin B12 3,900 (H) Vitamin C 0.6 WNL Vitamin D 52.61 WNL Copper 105 WNL Zinc 42 (L) PCT 0.79 WNL  Patient remains intubated on ventilator support. MV: 9 L/min Temp (24hrs), Avg:98.6 F (37 C), Min:97.7 F (36.5 C), Max:99.3 F (37.4 C)   Labs reviewed. Na 146, BUN 26 CBG: [phone]  Medications reviewed and include Novolog, Protonix, Dilantin, Keppra.  Net IO Since  Admission: -794.31 mL [07/24/21 1147] UOP 2475 ml x 24 hours Stool output 550 ml x 24 hours via rectal tube  Given hypernatremia and loose stools will change to a less concentrated formula with fiber.  Diet Order:   Diet Order             Diet NPO time specified  Diet effective now                   EDUCATION NEEDS:   No education needs have been identified at this time  Skin:  Skin Assessment: Skin Integrity Issues: Skin Integrity Issues:: Stage III Stage III: R ischial tuberosity, L hip  Last BM:  7/17 rectal pouch  Height:   Ht Readings from Last 1 Encounters:  07/18/21 _0  (1.93 m)    Weight:   Wt Readings from Last 1 Encounters:  07/24/21 73.5 kg     BMI:  Body mass index is 19.72 kg/m.  Estimated Nutritional Needs:   Kcal:  1900-2100  Protein:  120-140 gm  Fluid:  >/= 2 L    Lucas Mallow RD, LDN, CNSC Please refer to Amion for contact information.

## 2021-07-25 DIAGNOSIS — J189 Pneumonia, unspecified organism: Secondary | ICD-10-CM | POA: Diagnosis not present

## 2021-07-25 DIAGNOSIS — J9600 Acute respiratory failure, unspecified whether with hypoxia or hypercapnia: Secondary | ICD-10-CM | POA: Diagnosis not present

## 2021-07-25 DIAGNOSIS — R579 Shock, unspecified: Secondary | ICD-10-CM | POA: Diagnosis not present

## 2021-07-25 DIAGNOSIS — G35 Multiple sclerosis: Secondary | ICD-10-CM

## 2021-07-25 DIAGNOSIS — A419 Sepsis, unspecified organism: Secondary | ICD-10-CM | POA: Diagnosis not present

## 2021-07-25 DIAGNOSIS — N179 Acute kidney failure, unspecified: Secondary | ICD-10-CM | POA: Diagnosis not present

## 2021-07-25 LAB — BASIC METABOLIC PANEL
Anion gap: 8 (ref 5–15)
BUN: 26 mg/dL — ABNORMAL HIGH (ref 6–20)
CO2: 24 mmol/L (ref 22–32)
Calcium: 8.3 mg/dL — ABNORMAL LOW (ref 8.9–10.3)
Chloride: 111 mmol/L (ref 98–111)
Creatinine, Ser: 0.49 mg/dL — ABNORMAL LOW (ref 0.61–1.24)
GFR, Estimated: >60 mL/min (ref >60)
Glucose, Bld: 122 mg/dL — ABNORMAL HIGH (ref 70–99)
Potassium: 3.7 mmol/L (ref 3.5–5.1)
Sodium: 143 mmol/L (ref 135–145)

## 2021-07-25 LAB — GLUCOSE, CAPILLARY
Glucose-Capillary: 105 mg/dL — ABNORMAL HIGH (ref 70–99)
Glucose-Capillary: 122 mg/dL — ABNORMAL HIGH (ref 70–99)
Glucose-Capillary: 123 mg/dL — ABNORMAL HIGH (ref 70–99)
Glucose-Capillary: 133 mg/dL — ABNORMAL HIGH (ref 70–99)
Glucose-Capillary: 87 mg/dL (ref 70–99)
Glucose-Capillary: 95 mg/dL (ref 70–99)

## 2021-07-25 LAB — CBC
HCT: 25.2 % — ABNORMAL LOW (ref 39.0–52.0)
Hemoglobin: 7.8 g/dL — ABNORMAL LOW (ref 13.0–17.0)
MCH: 26.5 pg (ref 26.0–34.0)
MCHC: 31 g/dL (ref 30.0–36.0)
MCV: 85.7 fL (ref 80.0–100.0)
Platelets: 89 10*3/uL — ABNORMAL LOW (ref 150–400)
RBC: 2.94 MIL/uL — ABNORMAL LOW (ref 4.22–5.81)
RDW: 21.5 % — ABNORMAL HIGH (ref 11.5–15.5)
WBC: 10.3 10*3/uL (ref 4.0–10.5)
nRBC: 0 % (ref 0.0–0.2)

## 2021-07-25 MED ORDER — SODIUM BICARBONATE 650 MG PO TABS
650.0000 mg | ORAL_TABLET | Freq: Once | ORAL | Status: AC
Start: 2021-07-25 — End: 2021-07-25
  Administered 2021-07-25: 650 mg
  Filled 2021-07-25: qty 1

## 2021-07-25 MED ORDER — PANCRELIPASE (LIP-PROT-AMYL) 10440-39150 UNITS PO TABS
20880.0000 [IU] | ORAL_TABLET | Freq: Once | ORAL | Status: AC
  Administered 2021-07-25: 20880 [IU]
  Filled 2021-07-25: qty 2

## 2021-07-25 MED ORDER — PANCRELIPASE (LIP-PROT-AMYL) 10440-39150 UNITS PO TABS
20880.0000 [IU] | ORAL_TABLET | Freq: Once | ORAL | Status: AC
Start: 2021-07-25 — End: 2021-07-25
  Administered 2021-07-25: 20880 [IU]
  Filled 2021-07-25: qty 2

## 2021-07-25 NOTE — Progress Notes (Signed)
Palliative Medicine Progress Note   Patient Name: Terry Palmer       Date: 07/25/2021 DOB: [birth year]  Age: 46 y.o. MRN#: [id number] Attending Physician: Jacky Kindle, MD Primary Care Physician: Alvester Chou, NP Admit Date: 07/18/2021  Reason for Consultation/Follow-up: {Reason for Consult:23484}  HPI/Patient Profile: 46 y/o M who presented to [facility] ER on 7/11 with reports of low UOP.  At baseline, he lives at home with his wife and has a home Doctor, general practice.  He has MS and is bed bound / quad since at least 2013 with PEG in place.  [name] home health RN noted decreased UOP of <200 ml in 48h from his suprapubic catheter.  Admitted to the ICU in the setting of sepsis from LLL PNA - requiring mechanical support.  Ongoing seizures causing encephalopathic state.  Palliative care was asked to get involved to further address goals of care in the setting of patient's multiple chronic comorbid conditions and acute illness.  Have been asked more specifically to help identify if family would desire a tracheostomy or not.  Subjective: Chart reviewed and updates received from patient's RN.  Objective:  Physical Exam          Vital Signs: BP (!) 142/89 (BP Location: Left Arm)   Pulse 83   Temp (!) 97.3 F (36.3 C) (Esophageal)   Resp 13   Ht '6\' 4"'$  (1.93 m)   Wt 72.2 kg   SpO2 100%   BMI 19.38 kg/m  SpO2: SpO2: 100 % O2 Device: O2 Device: Ventilator O2 Flow Rate:    Intake/output summary:  Intake/Output Summary (Last 24 hours) at 07/25/2021 1124 Last data filed at 07/25/2021 1100 Gross per 24 hour  Intake 3225.99 ml  Output 2225 ml  Net 1000.99 ml    LBM: Last BM Date : 07/24/21 (rectal pouch)     Palliative Assessment/Data: ***     Palliative Medicine Assessment & Plan    Assessment: Principal Problem:   Shock ([place]) Active Problems:   Acute respiratory failure (HCC)    Recommendations/Plan: ***  Goals of Care and Additional Recommendations: Limitations on Scope of Treatment: {Recommended Scope and Preferences:21019}  Code Status:   Prognosis:  {Palliative Care Prognosis:23504}  Discharge Planning: {Palliative dispostion:23505}  Care plan was discussed with ***  Thank you for allowing the Palliative Medicine  Team to assist in the care of this patient.   ***   Lavena Bullion, NP   Please contact Palliative Medicine Team phone at [phone] for questions and concerns.  For individual providers, please see AMION.

## 2021-07-25 NOTE — Progress Notes (Signed)
NAME:  Terry Palmer, MRN:  [id number], DOB:  1975/10/18, LOS: 7 ADMISSION DATE:  07/18/2021, CONSULTATION DATE:  07/18/2021 REFERRING MD:  Dr. Regenia Skeeter, CHIEF COMPLAINT:  AMS, bradycardia   History of Present Illness:  46 y/o M who presented to [facility] ER on 7/11 with reports of low UOP.     At baseline, he lives at home with his wife and has a home Doctor, general practice.  He has MS and is bed bound / quad since at least 2013 with PEG in place.  [name] home health RN noted decreased UOP of <200 ml in 48h from his suprapubic catheter.  Almost no UOP on day of presentation. The RN changed the catheter with no change in output. At baseline, he is bed bound and blinks yes at baseline. He has been less alert. Family also reported a "rattling in his chest" for a few days. EMS was activated and patient was found to have a HR of 30.  Pacing was initiated by EMS at a rate of 70.  Initial BP 80/50. On arrival, the patient was activated as a possible sepsis. He was treated with 2.5L IVF. He was hypothermic and bair hugger was placed. The patient was noted to have a 3x3cm wound on the right and a 3x3 wound on the left hip. Per family, his wounds are actually improved.  His temperature on arrival was 82 degrees.  He was intubated in the ER per EDP. Initial labs - Na 131, K 4.6, Cl 97, CO2 26, glucose 95, BUN 64, cr 1.14, alk phos 141, albumin 2.6, AST 51, ALT 77, WBC 3.7, Hgb 9.9, and platelets 113.  ETT in good position on CXR, Basilar infiltrates on left.   While in ICU stay complicated by continued hypercarbic respiratory failure, concern for LLL PNA as well as continued encephalopathy. CXR with cardiomegaly and mild pulmonary vascular congestion, improving L perihilar opacity, and persistent bibasilar opacities. He does not appear volume overloaded however with his hypotension he would not tolerate any type of diuresis. Ammonia and TSH WNL. CT head no acute. 7/12 Neurology consulted. EEG with consideration for  epileptogenicity arising from the left hemisphere. Loaded with 3 g of keppra and continued 1500 mg BID. Cefepime D/C. 7/13 EEG with status epilepticus. Started on dilantin 100 mg TID. Continued goals of care with family. Wish to continue all measures.   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 On vanc, cefepime, metronidazole 7/12 Started zosyn 7/16 No further seizures on LTM in last 24hrs so d/c'd, on Zosyn, weaning of vent and off pressors  Interim History / Subjective:  No change in neurological status this morning. Currently trialing him on pressure support and he appears to be tolerating this well. There are questions about MOST form uploaded in his chart and POA--MOST form has his wife listed as POA but Epic notes his mom as POA. MOST form also has him listed as full code, comfort measures otherwise, and IV antibiotics + IV fluids as okay--conflicting requests.  Objective   Blood pressure 128/84, pulse 90, temperature (!) 97.3 F (36.3 C), temperature source Esophageal, resp. rate 16, height 6' 4"  (  1.93 m), weight 72.2 kg, SpO2 98 %. CVP:  [8 mmHg-14 mmHg] 14 mmHg  Vent Mode: CPAP;PSV FiO2 (%):  [40 %] 40 % Set Rate:  [18 bmp] 18 bmp Vt Set:  [500 mL] 500 mL PEEP:  [5 cmH20] 5 cmH20 Pressure Support:  [15 cmH20] 15 cmH20 Plateau Pressure:  [15 cmH20-19 cmH20] 19 cmH20   Intake/Output Summary (Last 24 hours) at 07/25/2021 0928 Last data filed at 07/25/2021 0900 Gross per 24 hour  Intake 3090.97 ml  Output 2350 ml  Net 740.97 ml    Filed Weights   07/23/21 0500 07/24/21 0413 07/25/21 0359  Weight: 75.1 kg 73.5  kg 72.2 kg    Constitutional:Chronically-ill appearing M in no acute distress. Cardio:Regular rate and rhythm. No murmurs, rubs, or gallops. Pulm:Decreased breath sounds over RUL minimally improved with diffuse rhonchi. Abdomen:Soft, nontender, nondistended. FGH:[id number] contracted extremities with 0-1+ pitting edema on bilateral hands and feet. Skin:Warm and dry. No rashes or lesions noted. Neuro:Blinks but does not follow commands, no response to pain.   Resolved Hospital Problem list   Hypothermia Bradycardia  Assessment & Plan:   Acute Metabolic Encephalopathy Status epilepticus  Hx seizure disorder Hx of multiple sclerosis Quadriplegic No change in neurological status at this time. Attempt to contact patient's wife to request in-person [place] conversation 07/17 was unsuccessful. He has a MOST form uploaded that has conflicting requests--full code but comfort measures if not actively coding, IV antibiotics and IV fluids okay. -Neurology consulted, appreciate their recommendations  -Continue seizure precautions  -Continue keppra, dilantin -Continue home baclofen, Neurontin -I will discuss above with our palliative team who have talked with the family previously and request assistance clarifying their wishes/continuing [place] conversations  Acute respiratory failure w/hypercarbia with encephalopathy requiring intubation  Sepsis in setting of Proteus UTI and Possible LLL pneumonia Plan Patient currently tolerating pressure support though mental status continues to preclude extubation. Mild improvement in aeration of RUL but now diffuse rhonchi noted. Respiratory culture has been reincubated for better growth. -Continue pressure support as tolerated; mental status is not adequate for extubation at this time and tracheostomy would not be appropriate -F/u respiratory culture -Continue GOC conversations  Hypotension in setting of sepsis, resolved Resolved. BP  stable.  Pancytopenia Platelets continue to improve, now 84. Hgb/HCT stable, no signs of bleeding at this time and no new rashes or lesions. HIT antibody negative.  -Trend CBC, transfuse for Hgb <7, PLTs <20,000 -Continue to hold heparin  Hypernatremia, resolved -Continue FW -Trend BMP  Type 2 diabetes mellitus A1c 5.1%. -Continue CBG, SSI  Neurogenic bladder UOP is adequate at this time with suprapubic catheter in place. -Monitor UOP -Avoid nephrotoxic agents, ensure adequate renal perfusion   Mildly elevated LFTs, improving  -Trend CMP intermittently    Hx of HTN -Hold clonidine   Sacral Wound POA Is reportedly improved from prior. No signs of acute infection at this time. -Wound care -Offload as able   Hx of depression Given encephalopathy, currently holding home regimen. -Continue to hold home xanax, valium, wellbutrin for now  Best Practice (right click and "Reselect all SmartList Selections" daily)   Diet/type: Tube feeds DVT prophylaxis: SCDs GI prophylaxis: PPI Lines: Central line Foley:  N/A Code Status:  full code Last date of multidisciplinary goals of care discussion [07/15]. Ongoing. Consult Palliative Care involved  Labs   CBC: Recent Labs  Lab 07/18/21 1708 07/18/21 1730 07/21/21 0355 07/22/21 0340 07/22/21 0843 07/23/21 0355 07/24/21 0414 07/25/21 0358  WBC 3.7*   < >  10.2 8.7  --  8.8 9.6 10.3  NEUTROABS 3.1  --   --   --   --   --   --   --   HGB 9.9*   < > 9.0* 8.2*  --  8.3* 7.8* 7.8*  HCT 33.9*   < > 28.5* 25.1*  --  26.4* 24.8* 25.2*  MCV 90.9   < > 83.1 82.6  --  85.2 86.1 85.7  PLT 113*   < > 44* 39* 43* 49* 63* 89*   < > = values in this interval not displayed.     Basic Metabolic Panel: Recent Labs  Lab 07/20/21 0534 07/21/21 0355 07/22/21 0340 07/23/21 0355 07/24/21 0414 07/25/21 0358  NA 142 144 147* 146* 146* 143  K 3.9 4.0 3.6 3.6 3.8 3.7  CL 110 110 113* 114* 113* 111  CO2 22 25 25 25 25 24   GLUCOSE 132*  207* 155* 139* 110* 122*  BUN 35* 35* 34* 30* 26* 26*  CREATININE 0.78 0.69 0.62 0.50* 0.50* 0.49*  CALCIUM 8.5* 8.5* 8.3* 8.3* 8.2* 8.3*  MG 2.0 2.1 2.2 2.2 2.1  --   PHOS 1.9* 1.9* 2.5 2.3* 2.8  --     GFR: Estimated Creatinine Clearance: 116.6 mL/min (A) (by C-G formula based on SCr of 0.49 mg/dL (L)). Recent Labs  Lab 07/18/21 1708 07/18/21 2311 07/19/21 0348 07/20/21 0534 07/21/21 0355 07/22/21 0340 07/23/21 0355 07/24/21 0414 07/25/21 0358  PROCALCITON  --  0.19 0.79 2.10  --   --   --   --   --   WBC 3.7*  --  7.9 12.5*   < > 8.7 8.8 9.6 10.3  LATICACIDVEN 1.0 2.1* 2.0*  --   --   --   --   --   --    < > = values in this interval not displayed.     Liver Function Tests: Recent Labs  Lab 07/18/21 1708 07/19/21 0348 07/20/21 0534  AST 51* 45* 35  ALT 77* 84* 61*  ALKPHOS 141* 146* 139*  BILITOT 0.2* 0.4 0.5  PROT 6.3* 6.6 5.9*  ALBUMIN 2.6* 2.5* 2.0*    No results for input(s): "LIPASE", "AMYLASE" in the last 168 hours. Recent Labs  Lab 07/18/21 2311  AMMONIA 20     ABG    Component Value Date/Time   PHART 7.407 07/19/2021 0820   PCO2ART 32.8 07/19/2021 0820   PO2ART 73 (L) 07/19/2021 0820   HCO3 21.1 07/19/2021 0820   TCO2 22 07/19/2021 0820   ACIDBASEDEF 4.0 (H) 07/19/2021 0820   O2SAT 96 07/19/2021 0820     Coagulation Profile: Recent Labs  Lab 07/18/21 1708 07/22/21 0843  INR 1.1 1.0     Cardiac Enzymes: No results for input(s): "CKTOTAL", "CKMB", "CKMBINDEX", "TROPONINI" in the last 168 hours.  HbA1C: Hgb A1c MFr Bld  Date/Time Value Ref Range Status  07/18/2021 11:11 PM 5.1 4.8 - 5.6 % Final    Comment:    (NOTE) Pre diabetes:          5.7%-6.4%  Diabetes:              >6.4%  Glycemic control for   <7.0% adults with diabetes   08/10/2020 05:42 PM 7.1 (H) 4.8 - 5.6 % Final    Comment:    (NOTE) Pre diabetes:          5.7%-6.4%  Diabetes:              >  6.4%  Glycemic control for   <7.0% adults with diabetes      CBG: Recent Labs  Lab 07/24/21 1537 07/24/21 1915 07/24/21 2324 07/25/21 0354 07/25/21 0725  GLUCAP 123* 72 106* 87 123*     Review of Systems:   Unable to obtain due to encephalopathy.  Critical care time: 35 minutes   CRITICAL CARE Performed by: Farrel Gordon

## 2021-07-25 NOTE — Progress Notes (Signed)
Attempted to unclog patient's PEG tubes multiple times with plain water as well as the medications in the unclogging protocol with no success at unclogging patient's PEG. MD notified. Patient does have OGT in place that is currently in use for medications and nutrition.

## 2021-07-26 ENCOUNTER — Inpatient Hospital Stay (HOSPITAL_COMMUNITY): Payer: Medicare HMO

## 2021-07-26 DIAGNOSIS — R579 Shock, unspecified: Secondary | ICD-10-CM | POA: Diagnosis not present

## 2021-07-26 HISTORY — PX: IR REPLACE G-TUBE SIMPLE WO FLUORO: IMG2323

## 2021-07-26 LAB — CBC
HCT: 23.4 % — ABNORMAL LOW (ref 39.0–52.0)
Hemoglobin: 7.6 g/dL — ABNORMAL LOW (ref 13.0–17.0)
MCH: 27.5 pg (ref 26.0–34.0)
MCHC: 32.5 g/dL (ref 30.0–36.0)
MCV: 84.8 fL (ref 80.0–100.0)
Platelets: 146 10*3/uL — ABNORMAL LOW (ref 150–400)
RBC: 2.76 MIL/uL — ABNORMAL LOW (ref 4.22–5.81)
RDW: 21.5 % — ABNORMAL HIGH (ref 11.5–15.5)
WBC: 8.7 10*3/uL (ref 4.0–10.5)
nRBC: 0 % (ref 0.0–0.2)

## 2021-07-26 LAB — BASIC METABOLIC PANEL
Anion gap: 10 (ref 5–15)
BUN: 23 mg/dL — ABNORMAL HIGH (ref 6–20)
CO2: 23 mmol/L (ref 22–32)
Calcium: 8.3 mg/dL — ABNORMAL LOW (ref 8.9–10.3)
Chloride: 107 mmol/L (ref 98–111)
Creatinine, Ser: 0.38 mg/dL — ABNORMAL LOW (ref 0.61–1.24)
GFR, Estimated: >60 mL/min (ref >60)
Glucose, Bld: 121 mg/dL — ABNORMAL HIGH (ref 70–99)
Potassium: 4 mmol/L (ref 3.5–5.1)
Sodium: 140 mmol/L (ref 135–145)

## 2021-07-26 LAB — GLUCOSE, CAPILLARY
Glucose-Capillary: 104 mg/dL — ABNORMAL HIGH (ref 70–99)
Glucose-Capillary: 117 mg/dL — ABNORMAL HIGH (ref 70–99)
Glucose-Capillary: 119 mg/dL — ABNORMAL HIGH (ref 70–99)
Glucose-Capillary: 87 mg/dL (ref 70–99)
Glucose-Capillary: 87 mg/dL (ref 70–99)
Glucose-Capillary: 87 mg/dL (ref 70–99)

## 2021-07-26 LAB — CULTURE, RESPIRATORY W GRAM STAIN

## 2021-07-26 MED ORDER — SODIUM CHLORIDE 0.9 % IV SOLN
2.0000 g | Freq: Three times a day (TID) | INTRAVENOUS | Status: AC
  Administered 2021-07-26 – 2021-07-28 (×8): 2 g via INTRAVENOUS
  Filled 2021-07-26 (×8): qty 2

## 2021-07-26 MED ORDER — SODIUM CHLORIDE 0.9 % IV SOLN
2.0000 g | Freq: Three times a day (TID) | INTRAVENOUS | Status: DC
  Administered 2021-07-26: 2 g via INTRAVENOUS
  Filled 2021-07-26: qty 12.5

## 2021-07-26 MED ORDER — LIDOCAINE VISCOUS HCL 2 % MT SOLN
OROMUCOSAL | Status: AC
  Filled 2021-07-26: qty 15

## 2021-07-26 MED ORDER — DIATRIZOATE MEGLUMINE & SODIUM 66-10 % PO SOLN
ORAL | Status: AC
  Filled 2021-07-26: qty 30

## 2021-07-26 MED ORDER — DIATRIZOATE MEGLUMINE & SODIUM 66-10 % PO SOLN
30.0000 mL | Freq: Once | ORAL | Status: DC
  Filled 2021-07-26: qty 30

## 2021-07-26 NOTE — Procedures (Signed)
Pre procedural Dx: Dysphagia, poorly functioning feeding tube. Post procedural Dx: Same  Successful bedside replacement of exisitng 18 Fr gastrostomy tube with a 20 Fr balloon retention gastrostomy tube.   KUB pending to confirm location, may use for feeds/meds/water once imaging confirms within gastric lumen.   EBL: None  Complications: None immediate.  Candiss Norse, PA-C

## 2021-07-26 NOTE — Progress Notes (Signed)
 NAME:  Terry Palmer, MRN:  [id number], DOB:  08/16/1975, LOS: 8 ADMISSION DATE:  07/18/2021, CONSULTATION DATE:  07/18/2021 REFERRING MD:  Dr. Goldston, CHIEF COMPLAINT:  AMS, bradycardia   History of Present Illness:  46 y/o M who presented to [facility] ER on 7/11 with reports of low UOP.     At baseline, he lives at home with his wife and has a home health RN.  He has MS and is bed bound / quad since at least 2013 with PEG in place.  [name] home health RN noted decreased UOP of <200 ml in 48h from his suprapubic catheter.  Almost no UOP on day of presentation. The RN changed the catheter with no change in output. At baseline, he is bed bound and blinks yes at baseline. He has been less alert. Family also reported a "rattling in his chest" for a few days. EMS was activated and patient was found to have a HR of 30.  Pacing was initiated by EMS at a rate of 70.  Initial BP 80/50. On arrival, the patient was activated as a possible sepsis. He was treated with 2.5L IVF. He was hypothermic and bair hugger was placed. The patient was noted to have a 3x3cm wound on the right and a 3x3 wound on the left hip. Per family, his wounds are actually improved.  His temperature on arrival was 82 degrees.  He was intubated in the ER per EDP. Initial labs - Na 131, K 4.6, Cl 97, CO2 26, glucose 95, BUN 64, cr 1.14, alk phos 141, albumin 2.6, AST 51, ALT 77, WBC 3.7, Hgb 9.9, and platelets 113.  ETT in good position on CXR, Basilar infiltrates on left.   While in ICU stay complicated by continued hypercarbic respiratory failure, concern for LLL PNA as well as continued encephalopathy. CXR with cardiomegaly and mild pulmonary vascular congestion, improving L perihilar opacity, and persistent bibasilar opacities. He does not appear volume overloaded however with his hypotension he would not tolerate any type of diuresis. Ammonia and TSH WNL. CT head no acute. 7/12 Neurology consulted. EEG with consideration for  epileptogenicity arising from the left hemisphere. Loaded with 3 g of keppra and continued 1500 mg BID. Cefepime D/C. 7/13 EEG with status epilepticus. Started on dilantin 100 mg TID. Continued goals of care with family. Wish to continue all measures.   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 On vanc, cefepime, metronidazole 7/12 Started zosyn 7/15 Finished Zosyn 7/16 No further seizures on LTM in last 24hrs so d/c'd, weaning of vent and off pressors  Interim History / Subjective:  Patient is stable at this time, still on full vent support but tolerated PS yesterday.   Objective   Blood pressure 135/84, pulse 98, temperature (!) 97 F (36.1 C), temperature source Esophageal, resp. rate 19, height 6' 4" (1.93 m), weight 72.5 kg, SpO2 99 %. CVP:  [1 mmHg-14 mmHg] 5 mmHg  Vent Mode: PRVC FiO2 (%):  [30 %-40 %] 40 % Set Rate:  [18 bmp] 18 bmp Vt Set:  [500 mL] 500 mL PEEP:  [5 cmH20] 5 cmH20 Pressure Support:  [10   [place] Pressure:  [14 cmH20-16 cmH20] 16 cmH20   Intake/Output Summary (Last 24 hours) at 07/26/2021 0829 Last data filed at 07/26/2021 0800 Gross per 24 hour  Intake 3095.89 ml  Output 2575 ml  Net 520.89 ml    Filed Weights   07/24/21 0413 07/25/21 0359 07/26/21 0500  Weight: 73.5 kg 72.2 kg 72.5 kg    Constitutional:Chronically-ill appearing M in no acute distress. Cardio:Regular rate and rhythm. No murmurs, rubs, or gallops. Pulm:Decreased breath sounds over RUL minimally improved with diffuse rhonchi. Abdomen:Soft, nontender, nondistended. QQI:[id number] contracted  extremities with 0-1+ pitting edema on bilateral hands and feet. Skin:Warm and dry. No rashes or lesions noted. Neuro:Blinks but does not follow commands, no response to pain. Gag reflex is present.  Resolved Hospital Problem list   Hypothermia Bradycardia  Assessment & Plan:   Acute Metabolic Encephalopathy Status epilepticus  Hx seizure disorder Hx of multiple sclerosis Quadriplegic Stable. Patient tolerated pressure support on vent yesterday. [place] conversation with his wife 07/18 to explain that he is likely at his baseline at this point, with blinking but no purposeful response and not able to follow commands. She does not want him to suffer but does not want to be responsible for his passing. In conversations with staff it seems that she may be more understanding of the reality of his illness and end-stage disease whereas other family members may have hope for greater improvements as he has had those in the past.  -Neurology consulted, appreciate their recommendations  -Continue seizure precautions  -Continue keppra, dilantin -Continue home baclofen, Neurontin -Will request in-person [place] conversation once patient's mom has had the ability to fly in from [place].  Acute respiratory failure w/hypercarbia with encephalopathy requiring intubation  Sepsis in setting of Proteus UTI and Possible LLL pneumonia Plan Patient tolerated pressure support on vent yesterday for some time. Lung exam is unchanged and respiratory culture resulted positive for few gram negative rods; susceptibilities are pending. He did receive coverage with zosyn earlier in this admission but not a full 5 days. Tracheostomy would not be appropriate for this patient. At this point in disease process, our team would recommend treating with cefepime for 2-3 days given respiratory culture results and performing one-way extubation once we have been able to have further discussions with family. -Will treat with cefepime for 2-3  days -Continue pressure support on vent as tolerated -F/u respiratory culture susceptibilities  -Continue GOC conversations  PEG tube  Patient had previously been receiving feeds via PEG tube until it unfortunately clogged late in the day 07/18 despite several attempts at clearing it. He does of OG tube which he has been receiving feeds through since. -Holding tube feeds at this time -IR tube exchange ordered  Hypotension in setting of sepsis, resolved Resolved. BP stable.  Pancytopenia Platelets continue to improve, now 146. Hgb/HCT stable, no signs of bleeding at this time and no new rashes or lesions.  -Trend CBC, transfuse for Hgb <7, PLTs <20,000 -Continue to hold heparin  Hypernatremia, resolved -Continue FW -Trend BMP  Type 2 diabetes mellitus A1c 5.1%. -Continue CBG, SSI  Neurogenic bladder UOP is adequate at this time with suprapubic catheter in place. -Monitor UOP -Avoid nephrotoxic agents, ensure adequate renal perfusion   Mildly elevated LFTs, improving  -Trend CMP intermittently    Hx of HTN -Hold clonidine   Sacral Wound POA Is reportedly improved from prior. No signs of acute infection at this time. -Wound care -Offload as able  Hx of depression Given encephalopathy, currently holding home regimen. -Continue to hold home xanax, valium, wellbutrin for now  Best Practice (right click and "Reselect all SmartList Selections" daily)   Diet/type: Tube feeds DVT prophylaxis: SCDs GI prophylaxis: PPI Lines: Central line Foley:  N/A Code Status:  full code Last date of multidisciplinary goals of care discussion [07/18]. Ongoing. Palliative Care involved.  Labs   CBC: Recent Labs  Lab 07/22/21 0340 07/22/21 0843 07/23/21 0355 07/24/21 0414 07/25/21 0358 07/26/21 0351  WBC 8.7  --  8.8 9.6 10.3 8.7  HGB 8.2*  --  8.3* 7.8* 7.8* 7.6*  HCT 25.1*  --  26.4* 24.8* 25.2* 23.4*  MCV 82.6  --  85.2 86.1 85.7 84.8  PLT 39* 43* 49* 63* 89* 146*      Basic Metabolic Panel: Recent Labs  Lab 07/20/21 0534 07/21/21 0355 07/22/21 0340 07/23/21 0355 07/24/21 0414 07/25/21 0358 07/26/21 0351  NA 142 144 147* 146* 146* 143 140  K 3.9 4.0 3.6 3.6 3.8 3.7 4.0  CL 110 110 113* 114* 113* 111 107  CO2 22 25 25 25 25 24 23  GLUCOSE 132* 207* 155* 139* 110* 122* 121*  BUN 35* 35* 34* 30* 26* 26* 23*  CREATININE 0.78 0.69 0.62 0.50* 0.50* 0.49* 0.38*  CALCIUM 8.5* 8.5* 8.3* 8.3* 8.2* 8.3* 8.3*  MG 2.0 2.1 2.2 2.2 2.1  --   --   PHOS 1.9* 1.9* 2.5 2.3* 2.8  --   --     GFR: Estimated Creatinine Clearance: 117.1 mL/min (A) (by C-G formula based on SCr of 0.38 mg/dL (L)). Recent Labs  Lab 07/20/21 0534 07/21/21 0355 07/23/21 0355 07/24/21 0414 07/25/21 0358 07/26/21 0351  PROCALCITON 2.10  --   --   --   --   --   WBC 12.5*   < > 8.8 9.6 10.3 8.7   < > = values in this interval not displayed.     Liver Function Tests: Recent Labs  Lab 07/20/21 0534  AST 35  ALT 61*  ALKPHOS 139*  BILITOT 0.5  PROT 5.9*  ALBUMIN 2.0*    No results for input(s): "LIPASE", "AMYLASE" in the last 168 hours. No results for input(s): "AMMONIA" in the last 168 hours.   ABG    Component Value Date/Time   PHART 7.407 07/19/2021 0820   PCO2ART 32.8 07/19/2021 0820   PO2ART 73 (L) 07/19/2021 0820   HCO3 21.1 07/19/2021 0820   TCO2 22 07/19/2021 0820   ACIDBASEDEF 4.0 (H) 07/19/2021 0820   O2SAT 96 07/19/2021 0820     Coagulation Profile: Recent Labs  Lab 07/22/21 0843  INR 1.0     Cardiac Enzymes: No results for input(s): "CKTOTAL", "CKMB", "CKMBINDEX", "TROPONINI" in the last 168 hours.  HbA1C: Hgb A1c MFr Bld  Date/Time Value Ref Range Status  07/18/2021 11:11 PM 5.1 4.8 - 5.6 % Final    Comment:    (NOTE) Pre diabetes:          5.7%-6.4%  Diabetes:              >6.4%  Glycemic control for   <7.0% adults with diabetes   08/10/2020 05:42 PM 7.1 (H) 4.8 - 5.6 % Final    Comment:    (NOTE) Pre diabetes:           5.7%-6.4%  Diabetes:              >6.4%  Glycemic control for   <7.0% adults   with diabetes     CBG: Recent Labs  Lab 07/25/21 1524 07/25/21 1912 07/25/21 2337 07/26/21 0326 07/26/21 0759  GLUCAP 122* 133* 105* 117* 87     Review of Systems:   Unable to obtain due to encephalopathy.  Critical care time: 35 minutes   CRITICAL CARE Performed by:    

## 2021-07-26 NOTE — Progress Notes (Addendum)
Pharmacy Antibiotic Note  Terry Palmer is a 46 y.o. male with hx MS, bedbound/quadriplegic since 2013 admitted on 07/18/2021 with pneumonia.  Pharmacy has been consulted for cefepime dosing - planning short-course per CCM. AKI on presentation resolving - SCr down to 0.38.  Plan: Cefepime 2g IV q8h Monitor clinical progress, c/s, renal function F/u de-escalation plan/LOT  Height: '6\' 4"'$  (193 cm) Weight: 72.5 kg (159 lb 13.3 oz) IBW/kg (Calculated) : 86.8  Temp (24hrs), Avg:97.1 F (36.2 C), Min:96.3 F (35.7 C), Max:99.1 F (37.3 C)  Recent Labs  Lab 07/22/21 0340 07/23/21 0355 07/24/21 0414 07/25/21 0358 07/26/21 0351  WBC 8.7 8.8 9.6 10.3 8.7  CREATININE 0.62 0.50* 0.50* 0.49* 0.38*    Estimated Creatinine Clearance: 117.1 mL/min (A) (by C-G formula based on SCr of 0.38 mg/dL (L)).    No Known Allergies   Arturo Morton, PharmD, BCPS Please check AMION for all [facility] contact numbers Clinical Pharmacist 07/26/2021 8:55 AM

## 2021-07-27 ENCOUNTER — Other Ambulatory Visit: Payer: Self-pay

## 2021-07-27 DIAGNOSIS — J9600 Acute respiratory failure, unspecified whether with hypoxia or hypercapnia: Secondary | ICD-10-CM | POA: Diagnosis not present

## 2021-07-27 DIAGNOSIS — J189 Pneumonia, unspecified organism: Secondary | ICD-10-CM | POA: Diagnosis not present

## 2021-07-27 DIAGNOSIS — R6521 Severe sepsis with septic shock: Secondary | ICD-10-CM | POA: Diagnosis not present

## 2021-07-27 DIAGNOSIS — N179 Acute kidney failure, unspecified: Secondary | ICD-10-CM | POA: Diagnosis not present

## 2021-07-27 DIAGNOSIS — J9601 Acute respiratory failure with hypoxia: Secondary | ICD-10-CM

## 2021-07-27 DIAGNOSIS — Z515 Encounter for palliative care: Secondary | ICD-10-CM | POA: Diagnosis not present

## 2021-07-27 DIAGNOSIS — A419 Sepsis, unspecified organism: Secondary | ICD-10-CM | POA: Diagnosis not present

## 2021-07-27 LAB — CBC
HCT: 24.3 % — ABNORMAL LOW (ref 39.0–52.0)
Hemoglobin: 7.7 g/dL — ABNORMAL LOW (ref 13.0–17.0)
MCH: 27 pg (ref 26.0–34.0)
MCHC: 31.7 g/dL (ref 30.0–36.0)
MCV: 85.3 fL (ref 80.0–100.0)
Platelets: 252 10*3/uL (ref 150–400)
RBC: 2.85 MIL/uL — ABNORMAL LOW (ref 4.22–5.81)
RDW: 21.5 % — ABNORMAL HIGH (ref 11.5–15.5)
WBC: 7.8 10*3/uL (ref 4.0–10.5)
nRBC: 0 % (ref 0.0–0.2)

## 2021-07-27 LAB — BASIC METABOLIC PANEL
Anion gap: 7 (ref 5–15)
BUN: 20 mg/dL (ref 6–20)
CO2: 23 mmol/L (ref 22–32)
Calcium: 8.3 mg/dL — ABNORMAL LOW (ref 8.9–10.3)
Chloride: 111 mmol/L (ref 98–111)
Creatinine, Ser: 0.54 mg/dL — ABNORMAL LOW (ref 0.61–1.24)
GFR, Estimated: >60 mL/min (ref >60)
Glucose, Bld: 148 mg/dL — ABNORMAL HIGH (ref 70–99)
Potassium: 3.8 mmol/L (ref 3.5–5.1)
Sodium: 141 mmol/L (ref 135–145)

## 2021-07-27 LAB — GLUCOSE, CAPILLARY
Glucose-Capillary: 99 mg/dL (ref 70–99)
Glucose-Capillary: 147 mg/dL — ABNORMAL HIGH (ref 70–99)
Glucose-Capillary: 117 mg/dL — ABNORMAL HIGH (ref 70–99)
Glucose-Capillary: 123 mg/dL — ABNORMAL HIGH (ref 70–99)
Glucose-Capillary: 105 mg/dL — ABNORMAL HIGH (ref 70–99)
Glucose-Capillary: 114 mg/dL — ABNORMAL HIGH (ref 70–99)

## 2021-07-27 MED ORDER — ACETAMINOPHEN 160 MG/5ML PO SOLN
650.0000 mg | ORAL | Status: DC | PRN
  Administered 2021-07-27 – 2021-08-03 (×3): 650 mg
  Filled 2021-07-27 (×3): qty 20.3

## 2021-07-27 MED ORDER — PNEUMOCOCCAL VAC POLYVALENT 25 MCG/0.5ML IJ INJ
0.5000 mL | INJECTION | INTRAMUSCULAR | Status: AC
  Administered 2021-07-29: 0.5 mL via INTRAMUSCULAR
  Filled 2021-07-27: qty 0.5

## 2021-07-27 NOTE — Progress Notes (Signed)
Palliative Medicine Progress Note   Patient Name: Terry Palmer       Date: 07/27/2021 DOB: August 18, 1975  Age: 45 y.o. MRN#: [id number] Attending Physician: Jacky Kindle, MD Primary Care Physician: Alvester Chou, NP Admit Date: 07/18/2021    HPI/Patient Profile: 46 y/o M who presented to [facility] ER on 7/11 with reports of low UOP.  At baseline, he lives at home with his wife and has a home Doctor, general practice.  He has MS and is bed bound / quad since at least 2013 with PEG in place.  [name] home health RN noted decreased UOP of <200 ml in 48h from his suprapubic catheter.  Admitted to the ICU in the setting of sepsis from LLL PNA - requiring mechanical support.  Ongoing seizures causing encephalopathic state.  Palliative care was asked to get involved to further address goals of care in the setting of patient's multiple chronic comorbid conditions and acute illness.   Subjective: Chart reviewed and patient assessed at bedside. Neurologic status remains poor. Patient's father is at bedside. He verbalizes understanding of plan to remove ventilator support when patient's mother returns from [place]. He states "it is time" and indicates he is accepting of the situation.  I spoke with patient's wife Terry Palmer by phone. She reports that patient's mother is arriving from [place] on Saturday 7/22. She confirms plan to remove ventilator support and transition to comfort care. Emotional support provided.    Objective:  Physical Exam Vitals reviewed.  Constitutional:      General: He is not in acute distress.    Appearance: He is ill-appearing.     Comments: contracted  Pulmonary:     Comments: intubated Neurological:     Comments: Does not follow commands Does not track             Vital Signs: BP 135/86    Pulse 93   Temp 98.5 F (36.9 C) (Axillary)   Resp 17   Ht '6\' 4"'$  (1.93 m)   Wt 70.1 kg   SpO2 100%   BMI 18.81 kg/m  SpO2: SpO2: 100 % O2 Device: O2 Device: Ventilator O2 Flow Rate:       LBM: Last BM Date : 07/27/21      Palliative Medicine Assessment & Plan   Assessment: Principal Problem:  Shock [facility]) Active Problems:   Acute respiratory failure ([place])    Recommendations/Plan: Continue current interventions Plan to transition to comfort care on Saturday 7/22 PMT will be available to support as needed   Prognosis:  poor    Thank you for allowing the Palliative Medicine Team to assist in the care of this patient.   MDM - moderate   Lavena Bullion, NP   Please contact Palliative Medicine Team phone at [phone] for questions and concerns.  For individual providers, please see AMION.

## 2021-07-27 NOTE — Progress Notes (Signed)
NAME:  Terry Palmer, MRN:  [id number], DOB:  1975-10-19, LOS: 9 ADMISSION DATE:  07/18/2021, CONSULTATION DATE:  07/18/2021 REFERRING MD:  Dr. Regenia Skeeter, CHIEF COMPLAINT:  AMS, bradycardia   History of Present Illness:  46 y/o M who presented to [facility] ER on 7/11 with reports of low UOP.     At baseline, he lives at home with his wife and has a home Doctor, general practice.  He has MS and is bed bound / quad since at least 2013 with PEG in place.  [name] home health RN noted decreased UOP of <200 ml in 48h from his suprapubic catheter.  Almost no UOP on day of presentation. The RN changed the catheter with no change in output. At baseline, he is bed bound and blinks yes at baseline. He has been less alert. Family also reported a "rattling in his chest" for a few days. EMS was activated and patient was found to have a HR of 30.  Pacing was initiated by EMS at a rate of 70.  Initial BP 80/50. On arrival, the patient was activated as a possible sepsis. He was treated with 2.5L IVF. He was hypothermic and bair hugger was placed. The patient was noted to have a 3x3cm wound on the right and a 3x3 wound on the left hip. Per family, his wounds are actually improved.  His temperature on arrival was 82 degrees.  He was intubated in the ER per EDP. Initial labs - Na 131, K 4.6, Cl 97, CO2 26, glucose 95, BUN 64, cr 1.14, alk phos 141, albumin 2.6, AST 51, ALT 77, WBC 3.7, Hgb 9.9, and platelets 113.  ETT in good position on CXR, Basilar infiltrates on left.   While in ICU stay complicated by continued hypercarbic respiratory failure, concern for LLL PNA as well as continued encephalopathy. CXR with cardiomegaly and mild pulmonary vascular congestion, improving L perihilar opacity, and persistent bibasilar opacities. He does not appear volume overloaded however with his hypotension he would not tolerate any type of diuresis. Ammonia and TSH WNL. CT head no acute. 7/12 Neurology consulted. EEG with consideration for  epileptogenicity arising from the left hemisphere. Loaded with 3 g of keppra and continued 1500 mg BID. Cefepime D/C. 7/13 EEG with status epilepticus. Started on dilantin 100 mg TID. Continued goals of care with family. Wish to continue all measures.   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 On vanc, cefepime, metronidazole 7/12 Started zosyn 7/15 Finished Zosyn 7/16 No further seizures on LTM in last 24hrs so d/c'd, weaning of vent and off pressors 7/19 Started ceftazidime  Interim History / Subjective:  No interval changes clinically. Patient's mom will be here Saturday at which time intention is for further [place] conversation, possible one-way extubation.  Objective   Blood pressure 118/76, pulse (!) 105, temperature 98.5 F (36.9 C), temperature source Axillary, resp. rate 20, height _0  (1.93 m), weight 70.1 kg, SpO2 97 %. CVP:  [4 mmHg-10 mmHg] 7 mmHg  Vent Mode: PSV;CPAP FiO2 (%):  [30 %] 30 % Set Rate:  [18 bmp] 18 bmp Vt Set:  [500 mL] 500 mL PEEP:  [  5 cmH20] 5 cmH20 Pressure Support:  [10 cmH20] 10 cmH20 Plateau Pressure:  [12 cmH20-16 cmH20] 15 cmH20   Intake/Output Summary (Last 24 hours) at 07/27/2021 0904 Last data filed at 07/27/2021 0600 Gross per 24 hour  Intake 1562.88 ml  Output 3000 ml  Net -1437.12 ml    Filed Weights   07/25/21 0359 07/26/21 0500 07/27/21 0400  Weight: 72.2 kg 72.5 kg 70.1 kg    Constitutional:Chronically-ill appearing M in no acute distress. Cardio:Regular rate and rhythm. No murmurs, rubs, or gallops. Pulm:Decreased breath sounds over RUL with diffuse rhonchi  otherwise. Abdomen:Soft, nontender, nondistended. EHU:DJSHFWYO contracted extremities with 0-1+ pitting edema on bilateral hands and feet. Skin:Warm and dry. No rashes or lesions noted. Neuro:Blinks but does not follow commands, no response to pain.   Resolved Hospital Problem list   Hypothermia Bradycardia Status epilepticus Hypotension Thrombocytopenia Hypernatremia  Assessment & Plan:   Acute Metabolic Encephalopathy Hx seizure disorder Hx of multiple sclerosis Quadriplegic Stable. He is apparently at his home baseline at this time.  -Neurology consulted, appreciate their recommendations  -Continue seizure precautions  -Continue keppra, dilantin -Continue home baclofen, Neurontin  Acute respiratory failure w/hypercarbia with encephalopathy requiring intubation  Sepsis in setting of Proteus UTI and Possible LLL pneumonia Plan Patient continues to tolerate pressure support on vent during the day. Patient's mother will be here Saturday at which time further [place] conversations will be held. Our team made recommendation for one-way extubation to patient's wife on 07/19 after a short-course of antibiotics in an attempt to improve his heavy secretions which she seemed in agreement with. According to his wife, it is not uncommon for him to have heavy secretions at home. Ceftazidime started 07/19 with respiratory culture results showing gram negative rods. -Ceftazidime day 2/3 -Continue pressure support on vent as tolerated -Continue GOC conversations  PEG tube  PEG tube replaced 07/19 by IR without complications.  Anemia Thrombocytopenia has resolved, platelets now 252. Normocytic anemia with Hgb 7.8 but stable. No evidence of bleeding at this time.  -Trend CBC, transfuse for Hgb <7, PLTs <20,000 -Continue to hold heparin  Type 2 diabetes mellitus A1c 5.1%. -Continue CBG, SSI  Neurogenic bladder UOP is adequate at this time with suprapubic catheter in place. -Monitor  UOP -Avoid nephrotoxic agents, ensure adequate renal perfusion   Mildly elevated LFTs, improving  -Trend CMP intermittently    Hx of HTN -Hold clonidine   Sacral Wound POA Is reportedly improved from prior. No signs of acute infection at this time. -Wound care -Offload as able   Hx of depression Given encephalopathy, currently holding home regimen. -Continue to hold home xanax, valium, wellbutrin for now  Best Practice (right click and "Reselect all SmartList Selections" daily)   Diet/type: Tube feeds DVT prophylaxis: SCDs GI prophylaxis: PPI Lines: Central line Foley:  N/A Code Status:  full code Last date of multidisciplinary goals of care discussion [07/18]. Ongoing. Palliative Care involved.  Labs   CBC: Recent Labs  Lab 07/23/21 0355 07/24/21 0414 07/25/21 0358 07/26/21 0351 07/27/21 0507  WBC 8.8 9.6 10.3 8.7 7.8  HGB 8.3* 7.8* 7.8* 7.6* 7.7*  HCT 26.4* 24.8* 25.2* 23.4* 24.3*  MCV 85.2 86.1 85.7 84.8 85.3  PLT 49* 63* 89* 146* 252     Basic Metabolic Panel: Recent Labs  Lab 07/21/21 0355 07/22/21 0340 07/23/21 0355 07/24/21 0414 07/25/21 0358 07/26/21 0351 07/27/21 0507  NA 144 147* 146* 146* 143 140 141  K 4.0 3.6 3.6 3.8 3.7 4.0 3.8  CL  110 113* 114* 113* 111 107 111  CO2 _0 GLUCOSE 207* 155* 139* 110* 122* 121* 148*  BUN 35* 34* 30* 26* 26* 23* 20  CREATININE 0.69 0.62 0.50* 0.50* 0.49* 0.38* 0.54*  CALCIUM 8.5* 8.3* 8.3* 8.2* 8.3* 8.3* 8.3*  MG 2.1 2.2 2.2 2.1  --   --   --   PHOS 1.9* 2.5 2.3* 2.8  --   --   --     GFR: Estimated Creatinine Clearance: 113.2 mL/min (A) (by C-G formula based on SCr of 0.54 mg/dL (L)). Recent Labs  Lab 07/24/21 0414 07/25/21 0358 07/26/21 0351 07/27/21 0507  WBC 9.6 10.3 8.7 7.8     Liver Function Tests: No results for input(s): "AST", "ALT", "ALKPHOS", "BILITOT", "PROT", "ALBUMIN" in the last 168 hours.  No results for input(s): "LIPASE", "AMYLASE" in the last 168  hours. No results for input(s): "AMMONIA" in the last 168 hours.   ABG    Component Value Date/Time   PHART 7.407 07/19/2021 0820   PCO2ART 32.8 07/19/2021 0820   PO2ART 73 (L) 07/19/2021 0820   HCO3 21.1 07/19/2021 0820   TCO2 22 07/19/2021 0820   ACIDBASEDEF 4.0 (H) 07/19/2021 0820   O2SAT 96 07/19/2021 0820     Coagulation Profile: Recent Labs  Lab 07/22/21 0843  INR 1.0     Cardiac Enzymes: No results for input(s): "CKTOTAL", "CKMB", "CKMBINDEX", "TROPONINI" in the last 168 hours.  HbA1C: Hgb A1c MFr Bld  Date/Time Value Ref Range Status  07/18/2021 11:11 PM 5.1 4.8 - 5.6 % Final    Comment:    (NOTE) Pre diabetes:          5.7%-6.4%  Diabetes:              >6.4%  Glycemic control for   <7.0% adults with diabetes   08/10/2020 05:42 PM 7.1 (H) 4.8 - 5.6 % Final    Comment:    (NOTE) Pre diabetes:          5.7%-6.4%  Diabetes:              >6.4%  Glycemic control for   <7.0% adults with diabetes     CBG: Recent Labs  Lab 07/26/21 1525 07/26/21 1939 07/26/21 2331 07/27/21 0334 07/27/21 0722  GLUCAP 87 104* 119* 99 147*     Review of Systems:   Unable to obtain due to encephalopathy.  Critical care time: 35 minutes   CRITICAL CARE Performed by: Farrel Gordon

## 2021-07-27 NOTE — Progress Notes (Signed)
This chaplain reviewed the chart notes and consulted with PMT NP-Julia before responding to the consult for creating/updating the Pt. Advance Directive.   The chaplain understands the Pt. is unable to make decisions about an Advance Directive at this time.  This chaplain is available for F/U spiritual care as needed.  Chaplain Sallyanne Kuster [phone]

## 2021-07-28 DIAGNOSIS — A419 Sepsis, unspecified organism: Secondary | ICD-10-CM | POA: Diagnosis not present

## 2021-07-28 DIAGNOSIS — J9600 Acute respiratory failure, unspecified whether with hypoxia or hypercapnia: Secondary | ICD-10-CM | POA: Diagnosis not present

## 2021-07-28 DIAGNOSIS — J189 Pneumonia, unspecified organism: Secondary | ICD-10-CM | POA: Diagnosis not present

## 2021-07-28 DIAGNOSIS — N179 Acute kidney failure, unspecified: Secondary | ICD-10-CM | POA: Diagnosis not present

## 2021-07-28 LAB — GLUCOSE, CAPILLARY
Glucose-Capillary: 109 mg/dL — ABNORMAL HIGH (ref 70–99)
Glucose-Capillary: 117 mg/dL — ABNORMAL HIGH (ref 70–99)
Glucose-Capillary: 118 mg/dL — ABNORMAL HIGH (ref 70–99)
Glucose-Capillary: 123 mg/dL — ABNORMAL HIGH (ref 70–99)
Glucose-Capillary: 132 mg/dL — ABNORMAL HIGH (ref 70–99)
Glucose-Capillary: 96 mg/dL (ref 70–99)

## 2021-07-28 LAB — HEPATIC FUNCTION PANEL
ALT: 21 U/L (ref 0–44)
AST: 17 U/L (ref 15–41)
Albumin: 2.4 g/dL — ABNORMAL LOW (ref 3.5–5.0)
Alkaline Phosphatase: 132 U/L — ABNORMAL HIGH (ref 38–126)
Bilirubin, Direct: <0.1 mg/dL (ref 0.0–0.2)
Total Bilirubin: 0.5 mg/dL (ref 0.3–1.2)
Total Protein: 7.4 g/dL (ref 6.5–8.1)

## 2021-07-28 LAB — CBC
HCT: 26.8 % — ABNORMAL LOW (ref 39.0–52.0)
Hemoglobin: 8.3 g/dL — ABNORMAL LOW (ref 13.0–17.0)
MCH: 26.9 pg (ref 26.0–34.0)
MCHC: 31 g/dL (ref 30.0–36.0)
MCV: 86.7 fL (ref 80.0–100.0)
Platelets: 388 10*3/uL (ref 150–400)
RBC: 3.09 MIL/uL — ABNORMAL LOW (ref 4.22–5.81)
RDW: 21.6 % — ABNORMAL HIGH (ref 11.5–15.5)
WBC: 9.2 10*3/uL (ref 4.0–10.5)
nRBC: 0 % (ref 0.0–0.2)

## 2021-07-28 LAB — BASIC METABOLIC PANEL
Anion gap: 8 (ref 5–15)
BUN: 19 mg/dL (ref 6–20)
CO2: 24 mmol/L (ref 22–32)
Calcium: 8.6 mg/dL — ABNORMAL LOW (ref 8.9–10.3)
Chloride: 110 mmol/L (ref 98–111)
Creatinine, Ser: 0.43 mg/dL — ABNORMAL LOW (ref 0.61–1.24)
GFR, Estimated: >60 mL/min (ref >60)
Glucose, Bld: 110 mg/dL — ABNORMAL HIGH (ref 70–99)
Potassium: 3.8 mmol/L (ref 3.5–5.1)
Sodium: 142 mmol/L (ref 135–145)

## 2021-07-28 MED ORDER — ENOXAPARIN SODIUM 40 MG/0.4ML IJ SOSY
40.0000 mg | PREFILLED_SYRINGE | INTRAMUSCULAR | Status: DC
  Administered 2021-07-28 – 2021-08-04 (×8): 40 mg via SUBCUTANEOUS
  Filled 2021-07-28 (×8): qty 0.4

## 2021-07-28 NOTE — TOC Progression Note (Signed)
Transition of Care [facility]) - Initial/Assessment Note    Patient Details  Name: Terry Palmer MRN: [id number] Date of Birth: 05-22-1975  Transition of [facility]) CM/SW Contact:    Milinda Antis, LCSWA Phone Number: 07/28/2021, 3:26 PM  Clinical Narrative:                 [place] meeting to be held tomorrow to discuss next steps.    TOC will continue to follow for any d/c needs.        Patient Goals and CMS Choice        Expected Discharge Plan and Services                                                Prior Living Arrangements/Services                       Activities of Daily Living Home Assistive Devices/Equipment: Enteral Feeding Supplies ADL Screening (condition at time of admission) Patient's cognitive ability adequate to safely complete daily activities?: Yes Is the patient deaf or have difficulty hearing?: No Does the patient have difficulty seeing, even when wearing glasses/contacts?: No Does the patient have difficulty concentrating, remembering, or making decisions?: Yes Patient able to express need for assistance with ADLs?: No Does the patient have difficulty dressing or bathing?: Yes Independently performs ADLs?: No Communication: Dependent Is this a change from baseline?: Pre-admission baseline Dressing (OT): Dependent Is this a change from baseline?: Pre-admission baseline Grooming: Dependent Is this a change from baseline?: Pre-admission baseline Feeding: Dependent Is this a change from baseline?: Pre-admission baseline Bathing: Dependent Is this a change from baseline?: Pre-admission baseline Toileting: Dependent Is this a change from baseline?: Pre-admission baseline In/Out Bed: Dependent Is this a change from baseline?: Pre-admission baseline Walks in Home: Dependent Is this a change from baseline?: Pre-admission baseline Does the patient have difficulty walking or climbing stairs?: Yes Weakness of Legs: Both Weakness  of Arms/Hands: Both  Permission Sought/Granted                  Emotional Assessment              Admission diagnosis:  Shock [facility]) [R57.9] Patient Active Problem List   Diagnosis Date Noted   Shock ([place]) 07/18/2021   Femur fracture ([place]) 08/10/2020   Hyponatremia 08/10/2020   Hypokalemia 08/10/2020   Pressure injury of skin 05/20/2020   CAP (community acquired pneumonia) 05/18/2020   Osteomyelitis ([place]) 05/18/2020   Goals of care, counseling/discussion    Palliative care by specialist    DNR (do not resuscitate) discussion    Palliative care encounter    Acute metabolic encephalopathy 95/63/8756   Pneumonia due to infectious organism    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   Lactic acidosis 04/27/2016   Acute cystitis with hematuria    Suprapubic catheter ([place])    G tube feedings ([place])    Flexion contractures    Encephalopathy    Bacteremia 05/27/2015   Sepsis ([place]) 05/25/2015   Altered mental state 05/25/2015   Altered mental status 05/25/2015   Fever, unspecified 03/04/2015   Quadriplegia and quadriparesis ([place]) 12/24/2014   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[place]) 12/24/2014   Acute respiratory failure ([place])    Fever    Hyperthermia, 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   Protein-calorie malnutrition, severe ([place]) 10/21/2014  UTI (urinary tract infection) 10/20/2014   Sepsis secondary to UTI ([place]) 10/20/2014   Leukocytosis 10/20/2014   Acute respiratory failure with hypoxia ([place]) 10/20/2014   Seizure disorder ([place]) 10/20/2014   Aspiration pneumonitis ([place]) 10/20/2014   Neurogenic bladder 05/28/2011   Multiple sclerosis ([place]) 04/20/2011   FTT (failure to thrive) in adult 04/20/2011   Sacral decubitus ulcer, stage II ([place]) 04/20/2011   Quadriparesis (muscle weakness) 03/12/2011   PCP:  Alvester Chou, NP Pharmacy:   [facility] [address] [place] [address] Phone: [phone] Fax:  [phone]  [facility] [address] [place] [address] Phone: [phone] Fax: [phone]  [id number]: [phone]: [phone]    Social Determinants of Health (SDOH) Interventions    Readmission Risk Interventions     No data to display

## 2021-07-28 NOTE — Plan of Care (Signed)
  Problem: Respiratory: Goal: Ability to maintain a clear airway and adequate ventilation will improve Outcome: Progressing   Problem: Health Behavior/Discharge Planning: Goal: Ability to manage health-related needs will improve Outcome: Progressing   Problem: Clinical Measurements: Goal: Ability to maintain clinical measurements within normal limits will improve Outcome: Progressing Goal: Will remain free from infection Outcome: Progressing Goal: Diagnostic test results will improve Outcome: Progressing Goal: Respiratory complications will improve Outcome: Progressing   Problem: Activity: Goal: Risk for activity intolerance will decrease Outcome: Progressing   Problem: Nutrition: Goal: Adequate nutrition will be maintained Outcome: Progressing   Problem: Coping: Goal: Level of anxiety will decrease Outcome: Progressing   Problem: Elimination: Goal: Will not experience complications related to bowel motility Outcome: Progressing Goal: Will not experience complications related to urinary retention Outcome: Progressing   Problem: Pain Managment: Goal: General experience of comfort will improve Outcome: Progressing   Problem: Safety: Goal: Ability to remain free from injury will improve Outcome: Progressing   Problem: Skin Integrity: Goal: Risk for impaired skin integrity will decrease Outcome: Progressing

## 2021-07-28 NOTE — Progress Notes (Signed)
NAME:  Terry Palmer, MRN:  [id number], DOB:  1975-06-21, LOS: 60 ADMISSION DATE:  07/18/2021, CONSULTATION DATE:  07/18/2021 REFERRING MD:  Dr. Regenia Skeeter, CHIEF COMPLAINT:  AMS, bradycardia   History of Present Illness:  46 y/o M who presented to [facility] ER on 7/11 with reports of low UOP.     At baseline, he lives at home with his wife and has a home Doctor, general practice.  He has MS and is bed bound / quad since at least 2013 with PEG in place.  [name] home health RN noted decreased UOP of <200 ml in 48h from his suprapubic catheter.  Almost no UOP on day of presentation. The RN changed the catheter with no change in output. At baseline, he is bed bound and blinks yes at baseline. He has been less alert. Family also reported a "rattling in his chest" for a few days. EMS was activated and patient was found to have a HR of 30.  Pacing was initiated by EMS at a rate of 70.  Initial BP 80/50. On arrival, the patient was activated as a possible sepsis. He was treated with 2.5L IVF. He was hypothermic and bair hugger was placed. The patient was noted to have a 3x3cm wound on the right and a 3x3 wound on the left hip. Per family, his wounds are actually improved.  His temperature on arrival was 82 degrees.  He was intubated in the ER per EDP. Initial labs - Na 131, K 4.6, Cl 97, CO2 26, glucose 95, BUN 64, cr 1.14, alk phos 141, albumin 2.6, AST 51, ALT 77, WBC 3.7, Hgb 9.9, and platelets 113.  ETT in good position on CXR, Basilar infiltrates on left.   While in ICU stay complicated by continued hypercarbic respiratory failure, concern for LLL PNA as well as continued encephalopathy. CXR with cardiomegaly and mild pulmonary vascular congestion, improving L perihilar opacity, and persistent bibasilar opacities. He does not appear volume overloaded however with his hypotension he would not tolerate any type of diuresis. Ammonia and TSH WNL. CT head no acute. 7/12 Neurology consulted. EEG with consideration for  epileptogenicity arising from the left hemisphere. Loaded with 3 g of keppra and continued 1500 mg BID. Cefepime D/C. 7/13 EEG with status epilepticus. Started on dilantin 100 mg TID. Continued goals of care with family. Wish to continue all measures.   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 On vanc, cefepime, metronidazole 7/12 Started zosyn 7/15 Finished Zosyn 7/16 No further seizures on LTM in last 24hrs so d/c'd, weaning of vent and off pressors 7/19 Started ceftazidime 7/19 PEG tube exchanged 7/21 Finished ceftazidime  Interim History / Subjective:  No interval changes.  Objective   Blood pressure 123/75, pulse 94, temperature 98.6 F (37 C), temperature source Axillary, resp. rate 18, height 6' 4"  (1.93 m), weight 69.6 kg, SpO2 98 %. CVP:  [7 mmHg-39 mmHg] 12 mmHg  Vent Mode: PRVC FiO2 (%):  [30 %] 30 % Set Rate:  [18 bmp] 18 bmp Vt Set:  [500 mL] 500 mL PEEP:  [5 cmH20] 5 cmH20 Pressure Support:  [8 cmH20] 8 cmH20 Plateau Pressure:  [  12 cmH20-16 cmH20] 12 cmH20   Intake/Output Summary (Last 24 hours) at 07/28/2021 0900 Last data filed at 07/28/2021 0800 Gross per 24 hour  Intake 2864.99 ml  Output 3226 ml  Net -361.01 ml    Filed Weights   07/26/21 0500 07/27/21 0400 07/28/21 0419  Weight: 72.5 kg 70.1 kg 69.6 kg    Constitutional:Chronically-ill appearing M in no acute distress. Cardio:Regular rate and rhythm. No murmurs, rubs, or gallops. Pulm:Decreased breath sounds over RUL with diffuse rhonchi otherwise.  Abdomen:Soft, nontender, nondistended. XKG:YJEHUDJS contracted extremities with 0-1+  pitting edema on bilateral hands and feet. R foot is cooler to touch than L. Skin:Warm and dry. No rashes or lesions noted. Neuro:Blinks eyes, possibly in response to commands--did appear to look up, down, squeeze shut, and open back up when asked to but difficult to definitively say.   Resolved Hospital Problem list   Hypothermia Bradycardia Status epilepticus Hypotension Thrombocytopenia Hypernatremia Acute metabolic encephalopathy Septic shock 2/2 LLL pneumonia and proteus UTI PEG tube malfunction Transaminitis  Assessment & Plan:   Hx seizure disorder Hx of multiple sclerosis Quadriplegic Stable. He is apparently at his home baseline at this time.  -Neurology consulted, appreciate their recommendations  -Continue seizure precautions  -Continue keppra, dilantin -Continue home baclofen, Neurontin  Acute hypercarbic respiratory failure requiring intubation Today is day 3/3 of ceftazidime. Exam is largely unchanged and he continues to tolerate pressure support on vent during the day. Tomorrow his mom should be able to come to the hospital with his wife, at which point I anticipate possible one-way extubation based on prior conversations.  -Ceftazidime day 3/3 -Continue pressure support on vent as tolerated -Continue GOC conversations  Anemia Normocytic anemia with Hgb 8.3, stable. -Trend CBC, transfuse for Hgb <7 -Start lovenox for DVT prophylaxis  Type 2 diabetes mellitus A1c 5.1%. -Continue CBG, SSI  Neurogenic bladder UOP is adequate at this time with suprapubic catheter in place. -Monitor UOP -Avoid nephrotoxic agents, ensure adequate renal perfusion   Hx of HTN -Hold clonidine   Sacral Wound POA Stable. No signs of acute infection at this time. -Wound care -Offload as able   Hx of depression Given encephalopathy, currently holding home regimen. -Continue to hold home xanax, valium, wellbutrin for now  Best Practice (right click and "Reselect all  SmartList Selections" daily)   Diet/type: Tube feeds DVT prophylaxis: SCDs GI prophylaxis: PPI Lines: Central line Foley:  N/A Code Status:  full code Last date of multidisciplinary goals of care discussion [07/18]. Ongoing. Palliative Care involved.  Labs   CBC: Recent Labs  Lab 07/24/21 0414 07/25/21 0358 07/26/21 0351 07/27/21 0507 07/28/21 0428  WBC 9.6 10.3 8.7 7.8 9.2  HGB 7.8* 7.8* 7.6* 7.7* 8.3*  HCT 24.8* 25.2* 23.4* 24.3* 26.8*  MCV 86.1 85.7 84.8 85.3 86.7  PLT 63* 89* 146* 252 388     Basic Metabolic Panel: Recent Labs  Lab 07/22/21 0340 07/23/21 0355 07/24/21 0414 07/25/21 0358 07/26/21 0351 07/27/21 0507 07/28/21 0428  NA 147* 146* 146* 143 140 141 142  K 3.6 3.6 3.8 3.7 4.0 3.8 3.8  CL 113* 114* 113* 111 107 111 110  CO2 25 25 25 24 23 23 24   GLUCOSE 155* 139* 110* 122* 121* 148* 110*  BUN 34* 30* 26* 26* 23* 20 19  CREATININE 0.62 0.50* 0.50* 0.49* 0.38* 0.54* 0.43*  CALCIUM 8.3* 8.3* 8.2* 8.3* 8.3* 8.3* 8.6*  MG 2.2 2.2 2.1  --   --   --   --  PHOS 2.5 2.3* 2.8  --   --   --   --     GFR: Estimated Creatinine Clearance: 112.4 mL/min (A) (by C-G formula based on SCr of 0.43 mg/dL (L)). Recent Labs  Lab 07/25/21 0358 07/26/21 0351 07/27/21 0507 07/28/21 0428  WBC 10.3 8.7 7.8 9.2     Liver Function Tests: Recent Labs  Lab 07/28/21 0428  AST 17  ALT 21  ALKPHOS 132*  BILITOT 0.5  PROT 7.4  ALBUMIN 2.4*    No results for input(s): "LIPASE", "AMYLASE" in the last 168 hours. No results for input(s): "AMMONIA" in the last 168 hours.   ABG    Component Value Date/Time   PHART 7.407 07/19/2021 0820   PCO2ART 32.8 07/19/2021 0820   PO2ART 73 (L) 07/19/2021 0820   HCO3 21.1 07/19/2021 0820   TCO2 22 07/19/2021 0820   ACIDBASEDEF 4.0 (H) 07/19/2021 0820   O2SAT 96 07/19/2021 0820     Coagulation Profile: Recent Labs  Lab 07/22/21 0843  INR 1.0     Cardiac Enzymes: No results for input(s): "CKTOTAL", "CKMB",  "CKMBINDEX", "TROPONINI" in the last 168 hours.  HbA1C: Hgb A1c MFr Bld  Date/Time Value Ref Range Status  07/18/2021 11:11 PM 5.1 4.8 - 5.6 % Final    Comment:    (NOTE) Pre diabetes:          5.7%-6.4%  Diabetes:              >6.4%  Glycemic control for   <7.0% adults with diabetes   08/10/2020 05:42 PM 7.1 (H) 4.8 - 5.6 % Final    Comment:    (NOTE) Pre diabetes:          5.7%-6.4%  Diabetes:              >6.4%  Glycemic control for   <7.0% adults with diabetes     CBG: Recent Labs  Lab 07/27/21 1508 07/27/21 1930 07/27/21 2318 07/28/21 0327 07/28/21 0721  GLUCAP 123* 105* 114* 117* 118*     Review of Systems:   Unable to obtain due to encephalopathy.  Critical care time: 35 minutes   CRITICAL CARE Performed by: Farrel Gordon

## 2021-07-29 DIAGNOSIS — Z515 Encounter for palliative care: Secondary | ICD-10-CM | POA: Diagnosis not present

## 2021-07-29 DIAGNOSIS — G35 Multiple sclerosis: Secondary | ICD-10-CM

## 2021-07-29 DIAGNOSIS — J9601 Acute respiratory failure with hypoxia: Secondary | ICD-10-CM | POA: Diagnosis not present

## 2021-07-29 DIAGNOSIS — A419 Sepsis, unspecified organism: Secondary | ICD-10-CM | POA: Diagnosis not present

## 2021-07-29 DIAGNOSIS — R579 Shock, unspecified: Secondary | ICD-10-CM | POA: Diagnosis not present

## 2021-07-29 LAB — CBC
HCT: 26.1 % — ABNORMAL LOW (ref 39.0–52.0)
Hemoglobin: 8.1 g/dL — ABNORMAL LOW (ref 13.0–17.0)
MCH: 26.8 pg (ref 26.0–34.0)
MCHC: 31 g/dL (ref 30.0–36.0)
MCV: 86.4 fL (ref 80.0–100.0)
Platelets: 484 10*3/uL — ABNORMAL HIGH (ref 150–400)
RBC: 3.02 MIL/uL — ABNORMAL LOW (ref 4.22–5.81)
RDW: 21.4 % — ABNORMAL HIGH (ref 11.5–15.5)
WBC: 8.5 10*3/uL (ref 4.0–10.5)
nRBC: 0 % (ref 0.0–0.2)

## 2021-07-29 LAB — BASIC METABOLIC PANEL
Anion gap: 9 (ref 5–15)
BUN: 21 mg/dL — ABNORMAL HIGH (ref 6–20)
CO2: 23 mmol/L (ref 22–32)
Calcium: 8.5 mg/dL — ABNORMAL LOW (ref 8.9–10.3)
Chloride: 105 mmol/L (ref 98–111)
Creatinine, Ser: 0.44 mg/dL — ABNORMAL LOW (ref 0.61–1.24)
GFR, Estimated: >60 mL/min (ref >60)
Glucose, Bld: 121 mg/dL — ABNORMAL HIGH (ref 70–99)
Potassium: 4 mmol/L (ref 3.5–5.1)
Sodium: 137 mmol/L (ref 135–145)

## 2021-07-29 LAB — GLUCOSE, CAPILLARY
Glucose-Capillary: 118 mg/dL — ABNORMAL HIGH (ref 70–99)
Glucose-Capillary: 119 mg/dL — ABNORMAL HIGH (ref 70–99)
Glucose-Capillary: 109 mg/dL — ABNORMAL HIGH (ref 70–99)
Glucose-Capillary: 112 mg/dL — ABNORMAL HIGH (ref 70–99)
Glucose-Capillary: 117 mg/dL — ABNORMAL HIGH (ref 70–99)
Glucose-Capillary: 117 mg/dL — ABNORMAL HIGH (ref 70–99)

## 2021-07-29 MED ORDER — GUAIFENESIN 100 MG/5ML PO LIQD
5.0000 mL | Freq: Two times a day (BID) | ORAL | Status: DC | PRN
Start: 2021-07-29 — End: 2021-08-04
  Administered 2021-07-29 – 2021-08-02 (×4): 5 mL
  Filled 2021-07-29 (×5): qty 10

## 2021-07-29 NOTE — Progress Notes (Addendum)
Pt extubated at 14:55 with RT Sarah, placed on Non-rebreather intially then transitioned to nasal cannula at 5L.  BP 137/76 HR 85 RR 14 SpO2 100%.  Lung sounds coarse/diminished.

## 2021-07-29 NOTE — Progress Notes (Signed)
Palliative Medicine Progress Note   Patient Name: Terry Palmer       Date: 07/29/2021 DOB: Jun 03, 1975  Age: 46 y.o. MRN#: [id number] Attending Physician: Terry Coder, MD Primary Care Physician: Terry Chou, NP Admit Date: 07/18/2021   HPI/Patient Profile: 46 y/o M who presented to [facility] ER on 7/11 with reports of low UOP.  At baseline, he lives at home with his wife and has a home Doctor, general practice.  He has MS and is bed bound / quad since at least 2013 with PEG in place.  [name] home health RN noted decreased UOP of <200 ml in 48h from his suprapubic catheter.  Admitted to the ICU in the setting of sepsis from LLL PNA - requiring mechanical support.  Ongoing seizures causing encephalopathic state.  Palliative care was asked to get involved to further address goals of care in the setting of patient's multiple chronic comorbid conditions and acute illness.    Subjective: Reviewed and updates received from patient's bedside RN.  It seems that patient's mental status has somewhat improved in the last several days.  He is more awake and has regained ability to blink purposefully in response to questions.  I spoke with Dr. Ander Slade outside the room.  He has already discussed with family and the plan is for extubation.  If patient does not tolerate extubation, plan is to reintubate if needed.   I met with patient's wife and mother at bedside.  Confirmed they do not wish to transition to comfort care at this time in the setting of patient's improved mental status.  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  They do verbalize understanding of the progressive nature of MS, and that at some point there will be a situation he cannot recover from.  Discussed the  importance of continued conversation with family and the medical team regarding plan of care and treatment options, ensuring decisions are within the context of overall goals of care   Objective:  Physical Exam Vitals reviewed.  Constitutional:      General: He is not in acute distress.    Appearance: He is ill-appearing.     Comments: contracted  Pulmonary:     Comments: intubated Neurological:     Comments: Blinks purposefully in response to  questions             Vital Signs: BP 98/72   Pulse 91   Temp 99.6 F (37.6 C) (Axillary)   Resp 18   Ht 6' 4"  (1.93 m)   Wt 67.2 kg   SpO2 100%   BMI 18.03 kg/m  SpO2: SpO2: 100 % O2 Device: O2 Device: Ventilator O2 Flow Rate:      LBM: Last BM Date : 07/28/21      Palliative Medicine Assessment & Plan   Assessment: Principal Problem:   Shock ([place]) Active Problems:   Acute respiratory failure ([place])    Recommendations/Plan: Full code Continue current full scope interventions Family not ready to transition to comfort care in the setting of patient's improved mental status Ongoing palliative support   Prognosis: Overall poor    Thank you for allowing the Palliative Medicine Team to assist in the care of this patient.   MDM - High   Lavena Bullion, NP   Please contact Palliative Medicine Team phone at [phone] for questions and concerns.  For individual providers, please see AMION.

## 2021-07-29 NOTE — Procedures (Signed)
Extubation Procedure Note  Patient Details:   Name: RAZI HICKLE DOB: 06-Jun-1975 MRN: [id number]   Airway Documentation:   + cuff leak test prior to extubation. ETT sx prior and oral sx prior to extubation.     Vent end date: 07/29/21 Vent end time: 1455   Evaluation  O2 sats: stable throughout Complications: No apparent complications Patient did tolerate procedure well. Bilateral Breath Sounds: Clear, Diminished   No stridor noted, No distress currently noted, sx for oral secretions s/p extubation.  BBSH coarse clear diminished.   Lenna Sciara 07/29/2021, 3:07 PM

## 2021-07-29 NOTE — Progress Notes (Signed)
NAME:  Terry Palmer, MRN:  [id number], DOB:  [birth year], LOS: 69 ADMISSION DATE:  07/18/2021, CONSULTATION DATE:  07/18/2021 REFERRING MD:  Dr. Regenia Skeeter, CHIEF COMPLAINT:  AMS, bradycardia   History of Present Illness:  46 y/o M who presented to [facility] ER on 7/11 with reports of low UOP.     At baseline, he lives at home with his wife and has a home Doctor, general practice.  He has MS and is bed bound / quad since at least 2013 with PEG in place.  [name] home health RN noted decreased UOP of <200 ml in 48h from his suprapubic catheter.  Almost no UOP on day of presentation. The RN changed the catheter with no change in output. At baseline, he is bed bound and blinks yes at baseline. He has been less alert. Family also reported a "rattling in his chest" for a few days. EMS was activated and patient was found to have a HR of 30.  Pacing was initiated by EMS at a rate of 70.  Initial BP 80/50. On arrival, the patient was activated as a possible sepsis. He was treated with 2.5L IVF. He was hypothermic and bair hugger was placed. The patient was noted to have a 3x3cm wound on the right and a 3x3 wound on the left hip. Per family, his wounds are actually improved.  His temperature on arrival was 82 degrees.  He was intubated in the ER per EDP. Initial labs - Na 131, K 4.6, Cl 97, CO2 26, glucose 95, BUN 64, cr 1.14, alk phos 141, albumin 2.6, AST 51, ALT 77, WBC 3.7, Hgb 9.9, and platelets 113.  ETT in good position on CXR, Basilar infiltrates on left.   While in ICU stay complicated by continued hypercarbic respiratory failure, concern for LLL PNA as well as continued encephalopathy. CXR with cardiomegaly and mild pulmonary vascular congestion, improving L perihilar opacity, and persistent bibasilar opacities. He does not appear volume overloaded however with his hypotension he would not tolerate any type of diuresis. Ammonia and TSH WNL. CT head no acute. 7/12 Neurology consulted. EEG with consideration for  epileptogenicity arising from the left hemisphere. Loaded with 3 g of keppra and continued 1500 mg BID. Cefepime D/C. 7/13 EEG with status epilepticus. Started on dilantin 100 mg TID. Continued goals of care with family. Wish to continue all measures.   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 On vanc, cefepime, metronidazole 7/12 Started zosyn 7/15 Finished Zosyn 7/16 No further seizures on LTM in last 24hrs so d/c'd, weaning of vent and off pressors 7/19 Started ceftazidime 7/19 PEG tube exchanged 7/21 Finished ceftazidime  Interim History / Subjective:  No interval changes. No overnight events Weaning on vent  Objective   Blood pressure 106/66, pulse 98, temperature 97.8 F (36.6 C), temperature source Oral, resp. rate 18, height 6' 4"  (1.93 m), weight 67.2 kg, SpO2 98 %. CVP:  [1 mmHg-12 mmHg] 6 mmHg  Vent Mode: PRVC FiO2 (%):  [30 %] 30 % Set Rate:  [18 bmp] 18 bmp Vt Set:  [500 mL] 500 mL PEEP:  [5 cmH20] 5 cmH20 Pressure Support:  [  [place] Pressure:  [15 cmH20] 15 cmH20   Intake/Output Summary (Last 24 hours) at 07/29/2021 0749 Last data filed at 07/29/2021 0615 Gross per 24 hour  Intake 2525.07 ml  Output 1900 ml  Net 625.07 ml   Filed Weights   07/27/21 0400 07/28/21 0419 07/29/21 0409  Weight: 70.1 kg 69.6 kg 67.2 kg    Constitutional: Chronically ill-appearing, does not appear to be in distress Cardio: S1-S2 appreciated Pulm: Bilateral rhonchi Abdomen:Soft, nontender, nondistended. MSK: Contractures  skin:Warm and dry. No rashes or lesions noted. Neuro: Spontaneously blinks,  not following commands  Resolved Hospital Problem list   Hypothermia Bradycardia Status epilepticus Hypotension Thrombocytopenia Hypernatremia Acute metabolic encephalopathy Septic shock 2/2 LLL pneumonia and proteus UTI PEG tube malfunction Transaminitis  Assessment & Plan:   History of seizure disorder History of multiple sclerosis Quadriplegic -Continue seizure medications -Continue Keppra, Dilantin -Continue home baclofen, Neurontin  Acute hypercapnic respiratory failure requiring intubation -He does tolerate pressure support during the day -Goal is one-way extubation today -Goals of care discussions as documented in record noted -Mom is to come in today and will have further discussions with her -Completed ceftazidime  Anemia -Trend CBC -Lovenox for DVT prophylaxis  Type 2 diabetes -SSI, CBG  Neurogenic bladder -Avoid nephrotoxic agents -Ensure adequate renal perfusion -Monitor urine output, has suprapubic catheter in place  History of hypertension -Hold clonidine  Sacral wound present on admission -Wound care -Offload as able  Hx of depression Given encephalopathy, currently holding home regimen. -Continue to hold home xanax, valium, wellbutrin for now  Best Practice (right click and "Reselect all SmartList Selections" daily)   Diet/type: Tube feeds DVT prophylaxis: SCDs GI prophylaxis: PPI Lines: Central line Foley:  N/A Code Status:  full code Last date of multidisciplinary goals of care discussion [07/18]. Ongoing. Palliative Care involved.  Labs   CBC: Recent Labs  Lab 07/25/21 0358 07/26/21 0351 07/27/21 0507 07/28/21 0428 07/29/21 0450  WBC 10.3 8.7 7.8 9.2 8.5  HGB 7.8* 7.6* 7.7* 8.3* 8.1*  HCT 25.2* 23.4* 24.3* 26.8* 26.1*  MCV 85.7 84.8 85.3 86.7 86.4  PLT 89* 146* 252 388 484*    Basic Metabolic Panel: Recent Labs  Lab 07/23/21 0355 07/24/21 0414 07/25/21 0358 07/26/21 0351 07/27/21 0507 07/28/21 0428  07/29/21 0450  NA 146* 146* 143 140 141 142 137  K 3.6 3.8 3.7 4.0 3.8 3.8 4.0  CL 114* 113* 111 107 111 110 105  CO2 25 25 24 23 23 24 23   GLUCOSE 139* 110* 122* 121* 148* 110* 121*  BUN 30* 26* 26* 23* 20 19 21*  CREATININE 0.50* 0.50* 0.49* 0.38* 0.54* 0.43* 0.44*  CALCIUM 8.3* 8.2* 8.3* 8.3* 8.3* 8.6* 8.5*  MG 2.2 2.1  --   --   --   --   --   PHOS 2.3* 2.8  --   --   --   --   --    GFR: Estimated Creatinine Clearance: 108.5 mL/min (A) (by C-G formula based on SCr of 0.44 mg/dL (L)). Recent Labs  Lab 07/26/21 0351 07/27/21 0507 07/28/21 0428 07/29/21 0450  WBC 8.7 7.8 9.2 8.5    Liver Function Tests: Recent Labs  Lab 07/28/21 0428  AST 17  ALT 21  ALKPHOS 132*  BILITOT 0.5  PROT 7.4  ALBUMIN 2.4*   No results for input(s): "LIPASE", "AMYLASE" in the last 168 hours. No results for input(s): "AMMONIA" in the last 168 hours.   ABG  Component Value Date/Time   PHART 7.407 07/19/2021 0820   PCO2ART 32.8 07/19/2021 0820   PO2ART 73 (L) 07/19/2021 0820   HCO3 21.1 07/19/2021 0820   TCO2 22 07/19/2021 0820   ACIDBASEDEF 4.0 (H) 07/19/2021 0820   O2SAT 96 07/19/2021 0820     Coagulation Profile: Recent Labs  Lab 07/22/21 0843  INR 1.0    Cardiac Enzymes: No results for input(s): "CKTOTAL", "CKMB", "CKMBINDEX", "TROPONINI" in the last 168 hours.  HbA1C: Hgb A1c MFr Bld  Date/Time Value Ref Range Status  07/18/2021 11:11 PM 5.1 4.8 - 5.6 % Final    Comment:    (NOTE) Pre diabetes:          5.7%-6.4%  Diabetes:              >6.4%  Glycemic control for   <7.0% adults with diabetes   08/10/2020 05:42 PM 7.1 (H) 4.8 - 5.6 % Final    Comment:    (NOTE) Pre diabetes:          5.7%-6.4%  Diabetes:              >6.4%  Glycemic control for   <7.0% adults with diabetes     CBG: Recent Labs  Lab 07/28/21 1114 07/28/21 1551 07/28/21 1935 07/28/21 2331 07/29/21 0326  GLUCAP 123* 132* 109* 96 118*    Review of Systems:   Unable to obtain  due to encephalopathy.  The patient is critically ill with multiple organ systems failure and requires high complexity decision making for assessment and support, frequent evaluation and titration of therapies, application of advanced monitoring technologies and extensive interpretation of multiple databases. Critical Care Time devoted to patient care services described in this note independent of APP/resident time (if applicable)  is 32 minutes.   Sherrilyn Rist MD [facility] Personal pager: See Amion If unanswered, please page CCM On-call: [phone]

## 2021-07-29 NOTE — Plan of Care (Signed)
  Problem: Respiratory: Goal: Ability to maintain a clear airway and adequate ventilation will improve Outcome: Progressing   Problem: Nutritional: Goal: Maintenance of adequate nutrition will improve Outcome: Progressing   Problem: Nutrition: Goal: Adequate nutrition will be maintained Outcome: Progressing   Problem: Safety: Goal: Ability to remain free from injury will improve Outcome: Progressing

## 2021-07-29 NOTE — Plan of Care (Signed)
  Problem: Respiratory: Goal: Ability to maintain a clear airway and adequate ventilation will improve Outcome: Not Progressing   Problem: Role Relationship: Goal: Method of communication will improve Outcome: Not Progressing   Problem: Education: Goal: Ability to describe self-care measures that may prevent or decrease complications (Diabetes Survival Skills Education) will improve Outcome: Not Progressing Goal: Individualized Educational Video(s) Outcome: Not Progressing   Problem: Coping: Goal: Ability to adjust to condition or change in health will improve Outcome: Not Progressing   Problem: Fluid Volume: Goal: Ability to maintain a balanced intake and output will improve Outcome: Not Progressing   Problem: Health Behavior/Discharge Planning: Goal: Ability to identify and utilize available resources and services will improve Outcome: Not Progressing Goal: Ability to manage health-related needs will improve Outcome: Not Progressing   Problem: Metabolic: Goal: Ability to maintain appropriate glucose levels will improve Outcome: Not Progressing   Problem: Nutritional: Goal: Maintenance of adequate nutrition will improve Outcome: Not Progressing Goal: Progress toward achieving an optimal weight will improve Outcome: Not Progressing   Problem: Skin Integrity: Goal: Risk for impaired skin integrity will decrease Outcome: Not Progressing   Problem: Tissue Perfusion: Goal: Adequacy of tissue perfusion will improve Outcome: Not Progressing   Problem: Education: Goal: Knowledge of General Education information will improve Description: Including pain rating scale, medication(s)/side effects and non-pharmacologic comfort measures Outcome: Not Progressing   Problem: Health Behavior/Discharge Planning: Goal: Ability to manage health-related needs will improve Outcome: Not Progressing   Problem: Clinical Measurements: Goal: Ability to maintain clinical measurements  within normal limits will improve Outcome: Not Progressing Goal: Will remain free from infection Outcome: Not Progressing Goal: Diagnostic test results will improve Outcome: Not Progressing Goal: Respiratory complications will improve Outcome: Not Progressing Goal: Cardiovascular complication will be avoided Outcome: Not Progressing   Problem: Activity: Goal: Risk for activity intolerance will decrease Outcome: Not Progressing   Problem: Nutrition: Goal: Adequate nutrition will be maintained Outcome: Not Progressing   Problem: Coping: Goal: Level of anxiety will decrease Outcome: Not Progressing   Problem: Elimination: Goal: Will not experience complications related to bowel motility Outcome: Not Progressing Goal: Will not experience complications related to urinary retention Outcome: Not Progressing   Problem: Pain Managment: Goal: General experience of comfort will improve Outcome: Not Progressing   Problem: Safety: Goal: Ability to remain free from injury will improve Outcome: Not Progressing   Problem: Skin Integrity: Goal: Risk for impaired skin integrity will decrease Outcome: Not Progressing

## 2021-07-30 DIAGNOSIS — R579 Shock, unspecified: Secondary | ICD-10-CM | POA: Diagnosis not present

## 2021-07-30 DIAGNOSIS — J189 Pneumonia, unspecified organism: Secondary | ICD-10-CM | POA: Diagnosis not present

## 2021-07-30 DIAGNOSIS — A419 Sepsis, unspecified organism: Secondary | ICD-10-CM | POA: Diagnosis not present

## 2021-07-30 DIAGNOSIS — J9600 Acute respiratory failure, unspecified whether with hypoxia or hypercapnia: Secondary | ICD-10-CM | POA: Diagnosis not present

## 2021-07-30 DIAGNOSIS — G35 Multiple sclerosis: Secondary | ICD-10-CM | POA: Diagnosis not present

## 2021-07-30 LAB — CBC
HCT: 25.7 % — ABNORMAL LOW (ref 39.0–52.0)
Hemoglobin: 8 g/dL — ABNORMAL LOW (ref 13.0–17.0)
MCH: 26.9 pg (ref 26.0–34.0)
MCHC: 31.1 g/dL (ref 30.0–36.0)
MCV: 86.5 fL (ref 80.0–100.0)
Platelets: 554 10*3/uL — ABNORMAL HIGH (ref 150–400)
RBC: 2.97 MIL/uL — ABNORMAL LOW (ref 4.22–5.81)
RDW: 21.2 % — ABNORMAL HIGH (ref 11.5–15.5)
WBC: 8.2 10*3/uL (ref 4.0–10.5)
nRBC: 0 % (ref 0.0–0.2)

## 2021-07-30 LAB — BASIC METABOLIC PANEL
Anion gap: 6 (ref 5–15)
BUN: 22 mg/dL — ABNORMAL HIGH (ref 6–20)
CO2: 24 mmol/L (ref 22–32)
Calcium: 8.5 mg/dL — ABNORMAL LOW (ref 8.9–10.3)
Chloride: 110 mmol/L (ref 98–111)
Creatinine, Ser: 0.37 mg/dL — ABNORMAL LOW (ref 0.61–1.24)
GFR, Estimated: >60 mL/min (ref >60)
Glucose, Bld: 114 mg/dL — ABNORMAL HIGH (ref 70–99)
Potassium: 4.1 mmol/L (ref 3.5–5.1)
Sodium: 140 mmol/L (ref 135–145)

## 2021-07-30 LAB — PHOSPHORUS: Phosphorus: 3.3 mg/dL (ref 2.5–4.6)

## 2021-07-30 LAB — GLUCOSE, CAPILLARY
Glucose-Capillary: 107 mg/dL — ABNORMAL HIGH (ref 70–99)
Glucose-Capillary: 116 mg/dL — ABNORMAL HIGH (ref 70–99)
Glucose-Capillary: 136 mg/dL — ABNORMAL HIGH (ref 70–99)
Glucose-Capillary: 105 mg/dL — ABNORMAL HIGH (ref 70–99)
Glucose-Capillary: 109 mg/dL — ABNORMAL HIGH (ref 70–99)
Glucose-Capillary: 109 mg/dL — ABNORMAL HIGH (ref 70–99)

## 2021-07-30 LAB — MAGNESIUM: Magnesium: 2.2 mg/dL (ref 1.7–2.4)

## 2021-07-30 MED ORDER — LEVETIRACETAM 500 MG PO TABS
1500.0000 mg | ORAL_TABLET | Freq: Two times a day (BID) | ORAL | Status: DC
  Administered 2021-07-30 – 2021-08-04 (×10): 1500 mg
  Filled 2021-07-30 (×11): qty 3

## 2021-07-30 NOTE — Progress Notes (Signed)
PHARMACIST - PHYSICIAN COMMUNICATION  DR:   Gala Murdoch  CONCERNING: IV to Oral Route Change Policy  RECOMMENDATION: This patient is receiving Keppra by the intravenous route.  Based on criteria approved by the Pharmacy and Therapeutics Committee, the intravenous medication(s) is/are being converted to the equivalent oral dose form(s).   DESCRIPTION: These criteria include: The patient is eating (either orally or via tube) and/or has been taking other orally administered medications for a least 24 hours The patient has no evidence of active gastrointestinal bleeding or impaired GI absorption (gastrectomy, short bowel, patient on TNA or NPO).  If you have questions about this conversion, please contact the Pharmacy Department  '[]'$   [phone] )  [facility] '[]'$   [phone] )  [facility] '[x]'$   [phone] )  [facility] '[]'$   [phone] )  [facility] '[]'$   [phone] )  [place], [facility] 07/30/2021 10:26 AM

## 2021-07-30 NOTE — Progress Notes (Signed)
NAME:  Terry Palmer, MRN:  [id number], DOB:  05/03/1975, LOS: 12 ADMISSION DATE:  07/18/2021, CONSULTATION DATE:  07/18/2021 REFERRING MD:  Dr. Regenia Skeeter, CHIEF COMPLAINT:  AMS, bradycardia   History of Present Illness:  46 y/o M who presented to [facility] ER on 7/11 with reports of low UOP.     At baseline, he lives at home with his wife and has a home Doctor, general practice.  He has MS and is bed bound / quad since at least 2013 with PEG in place.  [name] home health RN noted decreased UOP of <200 ml in 48h from his suprapubic catheter.  Almost no UOP on day of presentation. The RN changed the catheter with no change in output. At baseline, he is bed bound and blinks yes at baseline. He has been less alert. Family also reported a "rattling in his chest" for a few days. EMS was activated and patient was found to have a HR of 30.  Pacing was initiated by EMS at a rate of 70.  Initial BP 80/50. On arrival, the patient was activated as a possible sepsis. He was treated with 2.5L IVF. He was hypothermic and bair hugger was placed. The patient was noted to have a 3x3cm wound on the right and a 3x3 wound on the left hip. Per family, his wounds are actually improved.  His temperature on arrival was 82 degrees.  He was intubated in the ER per EDP. Initial labs - Na 131, K 4.6, Cl 97, CO2 26, glucose 95, BUN 64, cr 1.14, alk phos 141, albumin 2.6, AST 51, ALT 77, WBC 3.7, Hgb 9.9, and platelets 113.  ETT in good position on CXR, Basilar infiltrates on left.   While in ICU stay complicated by continued hypercarbic respiratory failure, concern for LLL PNA as well as continued encephalopathy. CXR with cardiomegaly and mild pulmonary vascular congestion, improving L perihilar opacity, and persistent bibasilar opacities. He does not appear volume overloaded however with his hypotension he would not tolerate any type of diuresis. Ammonia and TSH WNL. CT head no acute. 7/12 Neurology consulted. EEG with consideration for  epileptogenicity arising from the left hemisphere. Loaded with 3 g of keppra and continued 1500 mg BID. Cefepime D/C. 7/13 EEG with status epilepticus. Started on dilantin 100 mg TID. Continued goals of care with family. Wish to continue all measures.   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 On vanc, cefepime, metronidazole 7/12 Started zosyn 7/15 Finished Zosyn 7/16 No further seizures on LTM in last 24hrs so d/c'd, weaning of vent and off pressors 7/19 Started ceftazidime 7/19 PEG tube exchanged 7/21 Finished ceftazidime 7/22 extubated  Interim History / Subjective:  No overnight events Appears to be tolerating nasal cannula well  Objective   Blood pressure 136/88, pulse 90, temperature 98.3 F (36.8 C), temperature source Axillary, resp. rate 18, height 6' 4"  (1.93 m), weight 70.5 kg, SpO2 97 %. CVP:  [1 mmHg-13 mmHg] 4 mmHg  Vent Mode: PSV;CPAP FiO2 (%):  [30 %] 30 % PEEP:  [5 cmH20] 5 cmH20 Pressure Support:  [10 cmH20] 10 cmH20   Intake/Output Summary (Last 24 hours)  at 07/30/2021 1011 Last data filed at 07/30/2021 0737 Gross per 24 hour  Intake 1965 ml  Output 2775 ml  Net -810 ml   Filed Weights   07/28/21 0419 07/29/21 0409 07/30/21 0420  Weight: 69.6 kg 67.2 kg 70.5 kg    Constitutional: Chronically ill-appearing, does not appear to be in respiratory distress  cardio: S1-S2 appreciated Pulm: Rhonchi bilaterally Abdomen:Soft, nontender, nondistended. MSK: Contractures  skin:Warm and dry. No rashes or lesions noted. Neuro: Spontaneously blinks, not following commands  Resolved Hospital Problem  list   Hypothermia Bradycardia Status epilepticus Hypotension Thrombocytopenia Hypernatremia Acute metabolic encephalopathy Septic shock 2/2 LLL pneumonia and proteus UTI PEG tube malfunction Transaminitis  Assessment & Plan:   History of seizure disorder History of multiple sclerosis Quadriplegic -Continue Keppra, Dilantin -Continue baclofen and Neurontin  Acute hypercapnic respiratory failure requiring mechanical ventilation -Extubated successfully 7/22 -Tolerating oxygen by nasal cannula -Completed ceftazidime  Regarding goals of care -Patient remains a full code -To be reintubated if he fails  Type 2 diabetes -SSI, CBG  Neurogenic bladder -Ensure renal perfusion -Continue to monitor urine output  History of hypertension -No antihypertensives on home med list -Continue to monitor  Sacral wound present on admission -Continue wound care -Offload as able  History of depression -Home Xanax, Valium, Wellbutrin were on hold -We will reintroduce as needed if noted anxiety   Best Practice (right click and "Reselect all SmartList Selections" daily)   Diet/type: Tube feeds DVT prophylaxis: SCDs GI prophylaxis: PPI Lines: Central line Foley:  N/A Code Status:  full code Last date of multidisciplinary goals of care discussion [07/18]. Ongoing. Palliative Care involved.  Labs   CBC: Recent Labs  Lab 07/26/21 0351 07/27/21 0507 07/28/21 0428 07/29/21 0450 07/30/21 0421  WBC 8.7 7.8 9.2 8.5 8.2  HGB 7.6* 7.7* 8.3* 8.1* 8.0*  HCT 23.4* 24.3* 26.8* 26.1* 25.7*  MCV 84.8 85.3 86.7 86.4 86.5  PLT 146* 252 388 484* 554*    Basic Metabolic Panel: Recent Labs  Lab 07/24/21 0414 07/25/21 0358 07/26/21 0351 07/27/21 0507 07/28/21 0428 07/29/21 0450 07/30/21 0421  NA 146*   < > 140 141 142 137 140  K 3.8   < > 4.0 3.8 3.8 4.0 4.1  CL 113*   < > 107 111 110 105 110  CO2 25   < > 23 23 24 23 24   GLUCOSE 110*   < > 121* 148* 110* 121* 114*  BUN 26*   <  > 23* 20 19 21* 22*  CREATININE 0.50*   < > 0.38* 0.54* 0.43* 0.44* 0.37*  CALCIUM 8.2*   < > 8.3* 8.3* 8.6* 8.5* 8.5*  MG 2.1  --   --   --   --   --  2.2  PHOS 2.8  --   --   --   --   --  3.3   < > = values in this interval not displayed.   GFR: Estimated Creatinine Clearance: 113.8 mL/min (A) (by C-G formula based on SCr of 0.37 mg/dL (L)). Recent Labs  Lab 07/27/21 0507 07/28/21 0428 07/29/21 0450 07/30/21 0421  WBC 7.8 9.2 8.5 8.2    Liver Function Tests: Recent Labs  Lab 07/28/21 0428  AST 17  ALT 21  ALKPHOS 132*  BILITOT 0.5  PROT 7.4  ALBUMIN 2.4*   No results for input(s): "LIPASE", "AMYLASE" in the last 168 hours. No results for input(s): "AMMONIA" in the last 168 hours.   ABG  Component Value Date/Time   PHART 7.407 07/19/2021 0820   PCO2ART 32.8 07/19/2021 0820   PO2ART 73 (L) 07/19/2021 0820   HCO3 21.1 07/19/2021 0820   TCO2 22 07/19/2021 0820   ACIDBASEDEF 4.0 (H) 07/19/2021 0820   O2SAT 96 07/19/2021 0820     Coagulation Profile: No results for input(s): "INR", "PROTIME" in the last 168 hours.   Cardiac Enzymes: No results for input(s): "CKTOTAL", "CKMB", "CKMBINDEX", "TROPONINI" in the last 168 hours.  HbA1C: Hgb A1c MFr Bld  Date/Time Value Ref Range Status  07/18/2021 11:11 PM 5.1 4.8 - 5.6 % Final    Comment:    (NOTE) Pre diabetes:          5.7%-6.4%  Diabetes:              >6.4%  Glycemic control for   <7.0% adults with diabetes   08/10/2020 05:42 PM 7.1 (H) 4.8 - 5.6 % Final    Comment:    (NOTE) Pre diabetes:          5.7%-6.4%  Diabetes:              >6.4%  Glycemic control for   <7.0% adults with diabetes     CBG: Recent Labs  Lab 07/29/21 1533 07/29/21 1943 07/29/21 2321 07/30/21 0346 07/30/21 0752  GLUCAP 112* 117* 117* 107* 116*    Review of Systems:   Unable to obtain due to encephalopathy.  The patient is critically ill with multiple organ systems failure and requires high complexity decision  making for assessment and support, frequent evaluation and titration of therapies, application of advanced monitoring technologies and extensive interpretation of multiple databases. Critical Care Time devoted to patient care services described in this note independent of APP/resident time (if applicable)  is 31 minutes.   Sherrilyn Rist MD [facility] Personal pager: See Amion If unanswered, please page CCM On-call: [phone]

## 2021-07-30 NOTE — Progress Notes (Signed)
Palliative Medicine Progress Note   Patient Name: Terry Palmer       Date: 07/30/2021 DOB: December 02, 1975  Age: 46 y.o. MRN#: [id number] Attending Physician: Laurin Coder, MD Primary Care Physician: Alvester Chou, NP Admit Date: 07/18/2021    HPI/Patient Profile: 46 y/o M who presented to [place] ER on 7/11 with reports of low UOP.  At baseline, he lives at home with his wife and has a home Doctor, general practice.  He has MS and is bed bound / quad since at least 2013 with PEG in place.  [name] home health RN noted decreased UOP of <200 ml in 48h from his suprapubic catheter.  Admitted to the ICU in the setting of sepsis from LLL PNA - requiring mechanical support.  Ongoing seizures causing encephalopathic state.  Palliative care was asked to get involved to further address goals of care in the setting of patient's multiple chronic comorbid conditions and acute illness.     Subjective: Chart reviewed, update received from patient's bedside RN, and patient assessed at bedside.  His respiratory status has remained stable since he was extubated yesterday.  No excessive respiratory secretions noted on my assessment.  He does not seem as alert compared to yesterday.  He does not blink purposefully on my assessment.  I spoke with his wife [name] briefly by phone.  She expresses relief that Juma is doing okay off the ventilator.  She agrees he is not as alert as he was yesterday.  She remains hopeful for improvement.  Discussed the importance of continued conversation with family and the medical team regarding overall plan of care and treatment options, ensuring decisions are within the context of the patients values and GOCs.   Objective:  Physical Exam Vitals reviewed.  Constitutional:      General: He  is not in acute distress.    Appearance: He is ill-appearing.     Comments: contracted  Cardiovascular:     Rate and Rhythm: Normal rate.  Pulmonary:     Effort: Pulmonary effort is normal.  Neurological:     Mental Status: He is lethargic.     Comments: Non-verbal             Vital Signs: BP 126/81 (BP Location: Left Arm)   Pulse 91   Temp 97.9 F (36.6 C) (  Axillary)   Resp 18   Ht '6\' 4"'$  (1.93 m)   Wt 70.5 kg   SpO2 100%   BMI 18.92 kg/m  SpO2: SpO2: 100 % O2 Device: O2 Device: Nasal Cannula O2 Flow Rate: O2 Flow Rate (L/min): 5 L/min   LBM: Last BM Date : 07/29/21     Palliative Assessment/Data: PPS 30%     Palliative Medicine Assessment & Plan   Assessment: Principal Problem:   Shock ([place]) Active Problems:   Acute respiratory failure ([place])    Recommendations/Plan: Full code Continue current full scope interventions Ongoing palliative support Recommend outpatient palliative at discharge   Prognosis: Overall poor  Discharge Planning: To Be Determined, goal is to return home    Thank you for allowing the Palliative Medicine Team to assist in the care of this patient.   MDM - moderate   Lavena Bullion, NP   Please contact Palliative Medicine Team phone at [phone] for questions and concerns.  For individual providers, please see AMION.

## 2021-07-31 ENCOUNTER — Inpatient Hospital Stay (HOSPITAL_COMMUNITY): Payer: Medicare HMO

## 2021-07-31 DIAGNOSIS — R579 Shock, unspecified: Secondary | ICD-10-CM | POA: Diagnosis not present

## 2021-07-31 LAB — MAGNESIUM: Magnesium: 2.1 mg/dL (ref 1.7–2.4)

## 2021-07-31 LAB — PHOSPHORUS: Phosphorus: 3.3 mg/dL (ref 2.5–4.6)

## 2021-07-31 LAB — CBC
HCT: 27.3 % — ABNORMAL LOW (ref 39.0–52.0)
Hemoglobin: 8.3 g/dL — ABNORMAL LOW (ref 13.0–17.0)
MCH: 26.9 pg (ref 26.0–34.0)
MCHC: 30.4 g/dL (ref 30.0–36.0)
MCV: 88.6 fL (ref 80.0–100.0)
Platelets: 683 10*3/uL — ABNORMAL HIGH (ref 150–400)
RBC: 3.08 MIL/uL — ABNORMAL LOW (ref 4.22–5.81)
RDW: 21.2 % — ABNORMAL HIGH (ref 11.5–15.5)
WBC: 7.8 10*3/uL (ref 4.0–10.5)
nRBC: 0 % (ref 0.0–0.2)

## 2021-07-31 LAB — GLUCOSE, CAPILLARY
Glucose-Capillary: 89 mg/dL (ref 70–99)
Glucose-Capillary: 103 mg/dL — ABNORMAL HIGH (ref 70–99)
Glucose-Capillary: 118 mg/dL — ABNORMAL HIGH (ref 70–99)
Glucose-Capillary: 115 mg/dL — ABNORMAL HIGH (ref 70–99)
Glucose-Capillary: 137 mg/dL — ABNORMAL HIGH (ref 70–99)
Glucose-Capillary: 119 mg/dL — ABNORMAL HIGH (ref 70–99)

## 2021-07-31 LAB — BASIC METABOLIC PANEL
Anion gap: 7 (ref 5–15)
BUN: 28 mg/dL — ABNORMAL HIGH (ref 6–20)
CO2: 25 mmol/L (ref 22–32)
Calcium: 8.6 mg/dL — ABNORMAL LOW (ref 8.9–10.3)
Chloride: 109 mmol/L (ref 98–111)
Creatinine, Ser: 0.45 mg/dL — ABNORMAL LOW (ref 0.61–1.24)
GFR, Estimated: >60 mL/min (ref >60)
Glucose, Bld: 100 mg/dL — ABNORMAL HIGH (ref 70–99)
Potassium: 4.1 mmol/L (ref 3.5–5.1)
Sodium: 141 mmol/L (ref 135–145)

## 2021-07-31 LAB — PHENYTOIN LEVEL, TOTAL: Phenytoin Lvl: <2.5 ug/mL — ABNORMAL LOW (ref 10.0–20.0)

## 2021-07-31 MED ORDER — SODIUM CHLORIDE 0.9 % IV SOLN
200.0000 mg | Freq: Two times a day (BID) | INTRAVENOUS | Status: DC
  Administered 2021-08-01 – 2021-08-02 (×4): 200 mg via INTRAVENOUS
  Filled 2021-07-31 (×8): qty 4

## 2021-07-31 MED ORDER — SODIUM CHLORIDE 0.9 % IV SOLN
750.0000 mg | Freq: Once | INTRAVENOUS | Status: AC
  Administered 2021-07-31: 750 mg via INTRAVENOUS
  Filled 2021-07-31: qty 15

## 2021-07-31 MED ORDER — GLYCOPYRROLATE 0.2 MG/ML IJ SOLN
0.1000 mg | Freq: Three times a day (TID) | INTRAMUSCULAR | Status: DC
  Administered 2021-07-31 – 2021-08-04 (×12): 0.1 mg via INTRAVENOUS
  Filled 2021-07-31 (×13): qty 1

## 2021-07-31 NOTE — Plan of Care (Signed)
  Problem: Respiratory: Goal: Ability to maintain a clear airway and adequate ventilation will improve Outcome: Progressing   Problem: Role Relationship: Goal: Method of communication will improve Outcome: Progressing   Problem: Education: Goal: Ability to describe self-care measures that may prevent or decrease complications (Diabetes Survival Skills Education) will improve Outcome: Progressing Goal: Individualized Educational Video(s) Outcome: Progressing   Problem: Coping: Goal: Ability to adjust to condition or change in health will improve Outcome: Progressing   Problem: Fluid Volume: Goal: Ability to maintain a balanced intake and output will improve Outcome: Progressing   Problem: Health Behavior/Discharge Planning: Goal: Ability to identify and utilize available resources and services will improve Outcome: Progressing Goal: Ability to manage health-related needs will improve Outcome: Progressing   Problem: Metabolic: Goal: Ability to maintain appropriate glucose levels will improve Outcome: Progressing   Problem: Nutritional: Goal: Maintenance of adequate nutrition will improve Outcome: Progressing Goal: Progress toward achieving an optimal weight will improve Outcome: Progressing   Problem: Skin Integrity: Goal: Risk for impaired skin integrity will decrease Outcome: Progressing   Problem: Tissue Perfusion: Goal: Adequacy of tissue perfusion will improve Outcome: Progressing   Problem: Education: Goal: Knowledge of General Education information will improve Description: Including pain rating scale, medication(s)/side effects and non-pharmacologic comfort measures Outcome: Progressing   Problem: Health Behavior/Discharge Planning: Goal: Ability to manage health-related needs will improve Outcome: Progressing   Problem: Clinical Measurements: Goal: Ability to maintain clinical measurements within normal limits will improve Outcome: Progressing Goal:  Will remain free from infection Outcome: Progressing Goal: Diagnostic test results will improve Outcome: Progressing Goal: Respiratory complications will improve Outcome: Progressing Goal: Cardiovascular complication will be avoided Outcome: Progressing   Problem: Activity: Goal: Risk for activity intolerance will decrease Outcome: Progressing   Problem: Nutrition: Goal: Adequate nutrition will be maintained Outcome: Progressing   Problem: Coping: Goal: Level of anxiety will decrease Outcome: Progressing   Problem: Elimination: Goal: Will not experience complications related to bowel motility Outcome: Progressing Goal: Will not experience complications related to urinary retention Outcome: Progressing   Problem: Pain Managment: Goal: General experience of comfort will improve Outcome: Progressing   Problem: Safety: Goal: Ability to remain free from injury will improve Outcome: Progressing   Problem: Skin Integrity: Goal: Risk for impaired skin integrity will decrease Outcome: Progressing

## 2021-07-31 NOTE — Progress Notes (Signed)
Phenytoin level came back undetectable today. D/w Dr. Vaughan Browner and we will bolus and increase dose.  Vd = 49L  Dilantin '750mg'$  IV x1 then increase dose to '200mg'$  IV q12 Check another level in a few days  Onnie Boer, PharmD, [place], AAHIVP, CPP Infectious Disease Pharmacist 07/31/2021 4:09 PM

## 2021-07-31 NOTE — Progress Notes (Signed)
NAME:  Terry Palmer, MRN:  [id number], DOB:  12-19-1975, LOS: 36 ADMISSION DATE:  07/18/2021, CONSULTATION DATE:  07/18/2021 REFERRING MD:  Dr. Regenia Skeeter, CHIEF COMPLAINT:  AMS, bradycardia   History of Present Illness:  46 y/o M who presented to [facility] ER on 7/11 with reports of low UOP.     At baseline, he lives at home with his wife and has a home Doctor, general practice.  He has MS and is bed bound / quad since at least 2013 with PEG in place.  [name] home health RN noted decreased UOP of <200 ml in 48h from his suprapubic catheter.  Almost no UOP on day of presentation. The RN changed the catheter with no change in output. At baseline, he is bed bound and blinks yes at baseline. He has been less alert. Family also reported a "rattling in his chest" for a few days. EMS was activated and patient was found to have a HR of 30.  Pacing was initiated by EMS at a rate of 70.  Initial BP 80/50. On arrival, the patient was activated as a possible sepsis. He was treated with 2.5L IVF. He was hypothermic and bair hugger was placed. The patient was noted to have a 3x3cm wound on the right and a 3x3 wound on the left hip. Per family, his wounds are actually improved.  His temperature on arrival was 82 degrees.  He was intubated in the ER per EDP. Initial labs - Na 131, K 4.6, Cl 97, CO2 26, glucose 95, BUN 64, cr 1.14, alk phos 141, albumin 2.6, AST 51, ALT 77, WBC 3.7, Hgb 9.9, and platelets 113.  ETT in good position on CXR, Basilar infiltrates on left.   While in ICU stay complicated by continued hypercarbic respiratory failure, concern for LLL PNA as well as continued encephalopathy. CXR with cardiomegaly and mild pulmonary vascular congestion, improving L perihilar opacity, and persistent bibasilar opacities. He does not appear volume overloaded however with his hypotension he would not tolerate any type of diuresis. Ammonia and TSH WNL. CT head no acute. 7/12 Neurology consulted. EEG with consideration for  epileptogenicity arising from the left hemisphere. Loaded with 3 g of keppra and continued 1500 mg BID. Cefepime D/C. 7/13 EEG with status epilepticus. Started on dilantin 100 mg TID. Continued goals of care with family. Wish to continue all measures.   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 On vanc, cefepime, metronidazole 7/12 Started zosyn 7/15 Finished Zosyn 7/16 No further seizures on LTM in last 24hrs so d/c'd, weaning of vent and off pressors 7/19 Started ceftazidime 7/19 PEG tube exchanged 7/21 Finished ceftazidime 7/22 extubated  Interim History / Subjective:     Objective   Blood pressure 110/76, pulse 87, temperature (!) 97.4 F (36.3 C), temperature source Axillary, resp. rate 17, height 6' 4"  (1.93 m), weight 70.3 kg, SpO2 100 %. CVP:  [4 mmHg-5 mmHg] 4 mmHg      Intake/Output Summary (Last 24 hours) at 07/31/2021 0855 Last data filed at 07/31/2021 0600 Gross per 24 hour  Intake 743.5 ml  Output 1625 ml  Net -881.5 ml   Danley Danker  Weights   07/29/21 0409 07/30/21 0420 07/31/21 0312  Weight: 67.2 kg 70.5 kg 70.3 kg    Exam: Blood pressure 110/76, pulse 87, temperature (!) 97.4 F (36.3 C), temperature source Axillary, resp. rate 17, height 6' 4"  (1.93 m), weight 70.3 kg, SpO2 100 %. Gen:      No acute distress chronically ill-appearing HEENT:  EOMI, sclera anicteric Neck:     No masses; no thyromegaly Lungs:    Clear to auscultation bilaterally; normal respiratory effort CV:         Regular rate and rhythm; no murmurs Abd:      + bowel sounds; soft, non-tender; no palpable masses, no  distension Ext:    Contracted extremities Skin:      Warm and dry; no rash Neuro: Awake, blinks to stimuli.  Nonverbal  Labs/imaging reviewed Significant for BUN/creatinine 28/0.45 Hemoglobin 8.3, platelets 683 No new imaging   Resolved Hospital Problem list   Hypothermia Bradycardia Status epilepticus Hypotension Thrombocytopenia Hypernatremia Acute metabolic encephalopathy Septic shock 2/2 LLL pneumonia and proteus UTI PEG tube malfunction Transaminitis  Assessment & Plan:   History of seizure disorder History of multiple sclerosis Quadriplegic -Continue Keppra, Dilantin.  Checking Dilantin level -Continue baclofen and Neurontin  Acute hypercapnic respiratory failure requiring mechanical ventilation -Extubated successfully 7/22 -Tolerating oxygen by nasal cannula -Completed ceftazidime   Type 2 diabetes -SSI, CBG  Neurogenic bladder -Ensure renal perfusion -Continue to monitor urine output  History of hypertension -No antihypertensives on home med list -Continue to monitor  Sacral wound present on admission -Continue wound  History of depression -Home Xanax, Valium, Wellbutrin were on hold -We will reintroduce as needed if noted anxiety  Goals of care Palliative care is on board.  He remains full code.  Stable for transfer out of ICU and to hospitalist service.  Best Practice (right click and "Reselect all SmartList Selections" daily)   Diet/type: Tube feeds DVT prophylaxis: SCDs GI prophylaxis: PPI Lines: Central line.  Not a candidate for PICC due to arm contractures.  Will attempt peripheral IVs and remove central line Foley:  N/A Code Status:  full code Last date of multidisciplinary goals of care discussion [07/24]. Ongoing. Palliative Care involved.  Critical care time: NA   Marshell Garfinkel MD  Pulmonary & Critical care See Amion for pager  If no response to pager , please call [phone] until 7pm After 7:00 pm call Elink   [phone] 07/31/2021, 8:57 AM

## 2021-08-01 DIAGNOSIS — J189 Pneumonia, unspecified organism: Secondary | ICD-10-CM | POA: Diagnosis not present

## 2021-08-01 DIAGNOSIS — R579 Shock, unspecified: Secondary | ICD-10-CM | POA: Diagnosis not present

## 2021-08-01 DIAGNOSIS — J9601 Acute respiratory failure with hypoxia: Secondary | ICD-10-CM | POA: Diagnosis not present

## 2021-08-01 LAB — GLUCOSE, CAPILLARY
Glucose-Capillary: 124 mg/dL — ABNORMAL HIGH (ref 70–99)
Glucose-Capillary: 105 mg/dL — ABNORMAL HIGH (ref 70–99)
Glucose-Capillary: 134 mg/dL — ABNORMAL HIGH (ref 70–99)
Glucose-Capillary: 120 mg/dL — ABNORMAL HIGH (ref 70–99)
Glucose-Capillary: 109 mg/dL — ABNORMAL HIGH (ref 70–99)

## 2021-08-01 LAB — MAGNESIUM: Magnesium: 2.1 mg/dL (ref 1.7–2.4)

## 2021-08-01 LAB — BASIC METABOLIC PANEL
Anion gap: 7 (ref 5–15)
BUN: 25 mg/dL — ABNORMAL HIGH (ref 6–20)
CO2: 24 mmol/L (ref 22–32)
Calcium: 8.6 mg/dL — ABNORMAL LOW (ref 8.9–10.3)
Chloride: 108 mmol/L (ref 98–111)
Creatinine, Ser: 0.41 mg/dL — ABNORMAL LOW (ref 0.61–1.24)
GFR, Estimated: >60 mL/min (ref >60)
Glucose, Bld: 112 mg/dL — ABNORMAL HIGH (ref 70–99)
Potassium: 4 mmol/L (ref 3.5–5.1)
Sodium: 139 mmol/L (ref 135–145)

## 2021-08-01 LAB — CBC
HCT: 27.9 % — ABNORMAL LOW (ref 39.0–52.0)
Hemoglobin: 8.7 g/dL — ABNORMAL LOW (ref 13.0–17.0)
MCH: 27.2 pg (ref 26.0–34.0)
MCHC: 31.2 g/dL (ref 30.0–36.0)
MCV: 87.2 fL (ref 80.0–100.0)
Platelets: 698 10*3/uL — ABNORMAL HIGH (ref 150–400)
RBC: 3.2 MIL/uL — ABNORMAL LOW (ref 4.22–5.81)
RDW: 20.6 % — ABNORMAL HIGH (ref 11.5–15.5)
WBC: 5.7 10*3/uL (ref 4.0–10.5)
nRBC: 0 % (ref 0.0–0.2)

## 2021-08-01 LAB — PHOSPHORUS: Phosphorus: 3.2 mg/dL (ref 2.5–4.6)

## 2021-08-01 NOTE — Progress Notes (Signed)
[facility]  PROGRESS NOTE  Terry Palmer VUD:[id number] DOB: 06-05-1975 DOA: 07/18/2021 PCP: Alvester Chou, NP   Brief HPI:   46 year old male who lives at home with his wife, has home health RN.  Patient has MS and is bedbound/quadriplegic since 2013 with PEG tube in place.  Patient's home health RN noted decreased urine output of less than 200 mL 40 hours from his suprapubic catheter.  Patient has almost no urine output on day of presentation.  At baseline patient is bedbound, nonverbal and blinks yes at baseline.  He has been less alert.  Also has been having rattling in his chest for past few days.  EMS was called, he was found to have a heart rate of 30, pacing was initiated by EMS at a rate of 70 bpm.  Initial BP was 80/50. In the ED code sepsis was activated, he was given 2.5 L IV fluids.  Patient was hypothermic and Bair hugger was placed.The patient was noted to have a 3x3cm wound on the right and a 3x3 wound on the left hip. Per family, his wounds are actually improved.  His temperature on arrival was 82 degrees.  He was intubated in the ER per EDP. Initial labs - Na 131, K 4.6, Cl 97, CO2 26, glucose 95, BUN 64, cr 1.14, alk phos 141, albumin 2.6, AST 51, ALT 77, WBC 3.7, Hgb 9.9, and platelets 113.  ETT in good position on CXR, Basilar infiltrates on left.  ICU stay was complicated by continued hypercarbic respiratory failure concern for left lower lobe pneumonia as well as continued encephalopathy. Patient continued to be encephalopathic, despite being off sedation.  Neurology was consulted.  There was concern for subclinical status epilepticus.   EEG with consideration for epileptogenicity arising from the left hemisphere. Loaded with 3 g of keppra and continued 1500 mg BID. Cefepime D/C. 7/13 EEG with status epilepticus. Started on dilantin 100 mg TID. Continued goals of care with family. Wish to continue all measures.   7/11 Intubated, admitted with AMS, decreased UOP,  bradycardia. On vanc, cefepime, metronidazole 7/12 Started zosyn 7/15 Finished Zosyn; neurology signed off recommended to continue Keppra and Dilantin 7/16 No further seizures on LTM in last 24hrs so d/c'd, weaning of vent and off pressors 7/19 Started ceftazidime 7/19 PEG tube exchanged 7/21 Finished ceftazidime 7/22 extubated   Subjective   Patient continues to be nonverbal, lethargic.  Palliative care was consulted.   Assessment/Plan:   Subclinical seizures/status epilepticus- -resolved -Neurology recommended to continue with Keppra and Dilantin, -LTM EEG was discontinued -Neurology signed off  Multiple sclerosis/quadriplegia -Continue baclofen, Neurontin, Robinul  Acute hypercapnic respiratory failure requiring mechanical ventilation -Sepsis with septic shock due to left lower lobe pneumonia -Patient extubated successfully 7/22 -Completed ceftazidime -Tolerating oxygen via nasal cannula  Diabetes mellitus type 2 -Continue sliding scale insulin NovoLog -CBG well controlled  Neurogenic bladder -Continue to monitor urine output  History of hypertension -Patient not on medications at home  Sacral wound present on admission -Continue wound care  History of depression -Home dose of Xanax, Valium, Wellbutrin are on hold -Patient is very somnolent  Normocytic anemia -Hemoglobin at baseline around 9-10 -Hemoglobin has remained stable -Follow CBC in a.m.  Goals of care -Palliative care consulted, patient is full code -Full scope of care  Medications     baclofen  20 mg Per Tube QID   Chlorhexidine Gluconate Cloth  6 each Topical Q0600   enoxaparin (LOVENOX) injection  40 mg Subcutaneous Q24H  feeding supplement (PROSource TF)  45 mL Per Tube QID   free water  200 mL Per Tube Q6H   gabapentin  100 mg Per Tube Q12H   glycopyrrolate  0.1 mg Intravenous TID   insulin aspart  0-9 Units Subcutaneous Q4H   levETIRAcetam  1,500 mg Per Tube BID   mouth rinse  15  mL Mouth Rinse Q2H   pantoprazole sodium  40 mg Per Tube Daily   sodium chloride flush  10-40 mL Intracatheter Q12H   zinc sulfate  220 mg Per Tube Daily     Data Reviewed:   CBG:  Recent Labs  Lab 07/31/21 2251 08/01/21 0438 08/01/21 0732 08/01/21 1156 08/01/21 1615  GLUCAP 119* 124* 105* 134* 120*    SpO2: 99 % O2 Flow Rate (L/min): 3 L/min FiO2 (%): 30 %    Vitals:   08/01/21 0729 08/01/21 1151 08/01/21 1607 08/01/21 1611  BP: 104/66 (!) 102/91 100/71   Pulse: 87 95 95 93  Resp: 18 18 14 15   Temp: 98.4 F (36.9 C) (!) 97.4 F (36.3 C) 98.7 F (37.1 C)   TempSrc: Oral Oral Oral   SpO2: 99% 95% 100% 99%  Weight:      Height:          Data Reviewed:  Basic Metabolic Panel: Recent Labs  Lab 07/28/21 0428 07/29/21 0450 07/30/21 0421 07/31/21 0313 08/01/21 0135  NA 142 137 140 141 139  K 3.8 4.0 4.1 4.1 4.0  CL 110 105 110 109 108  CO2 24 23 24 25 24   GLUCOSE 110* 121* 114* 100* 112*  BUN 19 21* 22* 28* 25*  CREATININE 0.43* 0.44* 0.37* 0.45* 0.41*  CALCIUM 8.6* 8.5* 8.5* 8.6* 8.6*  MG  --   --  2.2 2.1 2.1  PHOS  --   --  3.3 3.3 3.2    CBC: Recent Labs  Lab 07/28/21 0428 07/29/21 0450 07/30/21 0421 07/31/21 0313 08/01/21 0135  WBC 9.2 8.5 8.2 7.8 5.7  HGB 8.3* 8.1* 8.0* 8.3* 8.7*  HCT 26.8* 26.1* 25.7* 27.3* 27.9*  MCV 86.7 86.4 86.5 88.6 87.2  PLT 388 484* 554* 683* 698*    LFT Recent Labs  Lab 07/28/21 0428  AST 17  ALT 21  ALKPHOS 132*  BILITOT 0.5  PROT 7.4  ALBUMIN 2.4*     Antibiotics: Anti-infectives (From admission, onward)    Start     Dose/Rate Route Frequency Ordered Stop   07/26/21 1230  cefTAZidime (FORTAZ) 2 g in sodium chloride 0.9 % 100 mL IVPB        2 g 200 mL/hr over 30 Minutes Intravenous Every 8 hours 07/26/21 1139 07/28/21 2010   07/26/21 0945  ceFEPIme (MAXIPIME) 2 g in sodium chloride 0.9 % 100 mL IVPB  Status:  Discontinued        2 g 200 mL/hr over 30 Minutes Intravenous Every 8 hours 07/26/21  0854 07/26/21 1139   07/19/21 2000  vancomycin (VANCOREADY) IVPB 1250 mg/250 mL  Status:  Discontinued        1,250 mg 166.7 mL/hr over 90 Minutes Intravenous Every 12 hours 07/19/21 0936 07/19/21 0937   07/19/21 1800  piperacillin-tazobactam (ZOSYN) IVPB 3.375 g        3.375 g 12.5 mL/hr over 240 Minutes Intravenous Every 8 hours 07/19/21 1413 07/22/21 2119   07/19/21 0800  vancomycin (VANCOCIN) IVPB 1000 mg/200 mL premix  Status:  Discontinued        1,000 mg 200  mL/hr over 60 Minutes Intravenous Every 12 hours 07/18/21 1725 07/19/21 0936   07/19/21 0200  ceFEPIme (MAXIPIME) 2 g in sodium chloride 0.9 % 100 mL IVPB  Status:  Discontinued        2 g 200 mL/hr over 30 Minutes Intravenous Every 8 hours 07/18/21 1725 07/19/21 1328   07/18/21 1715  ceFEPIme (MAXIPIME) 2 g in sodium chloride 0.9 % 100 mL IVPB        2 g 200 mL/hr over 30 Minutes Intravenous  Once 07/18/21 1713 07/18/21 1905   07/18/21 1715  metroNIDAZOLE (FLAGYL) IVPB 500 mg        500 mg 100 mL/hr over 60 Minutes Intravenous  Once 07/18/21 1713 07/18/21 1937   07/18/21 1715  vancomycin (VANCOCIN) IVPB 1000 mg/200 mL premix        1,000 mg 200 mL/hr over 60 Minutes Intravenous  Once 07/18/21 1713 07/18/21 2009        DVT prophylaxis: Lovenox  Code Status: Full code  Family Communication: No family at bedside   CONSULTS PCCM   Objective    Physical Examination:   General: Appears lethargic, somnolent Cardiovascular: S1-S2, regular, no murmur auscultated Respiratory: Bilateral rhonchi auscultated Abdomen: Abdomen is soft, nontender, no organomegaly Extremities: No edema in the lower extremities Neurologic: Somnolent, but arousable, nonverbal   Status is: Inpatient:      Pressure Injury 08/11/20 Back Lateral;Left;Upper Stage 3 -  Full thickness tissue loss. Subcutaneous fat may be visible but bone, tendon or muscle are NOT exposed. (Active)  08/11/20 1644  Location: Back  Location Orientation:  Lateral;Left;Upper  Staging: Stage 3 -  Full thickness tissue loss. Subcutaneous fat may be visible but bone, tendon or muscle are NOT exposed.  Wound Description (Comments):   Present on Admission: Yes     Pressure Injury 07/18/21 Hip Left Stage 3 -  Full thickness tissue loss. Subcutaneous fat may be visible but bone, tendon or muscle are NOT exposed. PInk (Active)  07/18/21 2100  Location: Hip  Location Orientation: Left  Staging: Stage 3 -  Full thickness tissue loss. Subcutaneous fat may be visible but bone, tendon or muscle are NOT exposed.  Wound Description (Comments): PInk  Present on Admission: Yes     Pressure Injury 07/18/21 Hip Right Stage 3 -  Full thickness tissue loss. Subcutaneous fat may be visible but bone, tendon or muscle are NOT exposed. pink (Active)  07/18/21 2100  Location: Hip  Location Orientation: Right  Staging: Stage 3 -  Full thickness tissue loss. Subcutaneous fat may be visible but bone, tendon or muscle are NOT exposed.  Wound Description (Comments): pink  Present on Admission: Yes        [place]   [facility] If 7PM-7AM, please contact night-coverage at [email or web address], Office  [phone]   08/01/2021, 5:35 PM  LOS: 14 days

## 2021-08-01 NOTE — Care Management Important Message (Signed)
Important Message  Patient Details  Name: Terry Palmer MRN: [id number] Date of Birth: 1975-01-17   Medicare Important Message Given:  Yes     Shelda Altes 08/01/2021, 1:05 PM

## 2021-08-01 NOTE — Progress Notes (Signed)
Nutrition Follow-up  DOCUMENTATION CODES:   Not applicable  INTERVENTION:   Tube feeding via PEG: Change to Jevity 1.2 at 65 ml/h (1560 ml per day) Prosource TF 45 ml QID   Provides 2032 kcal, 130 gm protein, 1264 ml free water daily.   Free water flushes 200 ml every 6 hours for a total of 2064 ml daily.    Continue Juven 1 packet BID via tube, each packet provides 80 calories, 8 grams of carbohydrate, 2.5  grams of protein (collagen), 7 grams of L-arginine and 7 grams of L-glutamine; supplement contains CaHMB, Vitamins C, E, B12 and Zinc to promote wound healing.    Continue MVI with minerals daily via tube.  NUTRITION DIAGNOSIS:   Increased nutrient needs related to wound healing as evidenced by estimated needs.  Ongoing  GOAL:   Patient will meet greater than or equal to 90% of their needs  Goal met-addressing needs via PEG tube feeding  MONITOR:   Vent status, TF tolerance, Labs, Skin  REASON FOR ASSESSMENT:   Ventilator, Consult Enteral/tube feeding initiation and management  ASSESSMENT:   46 yo male admitted with AMS, bradycardia, low UOP. PMH includes multiple sclerosis, quadriparesis, bed bound, PEG, recurrent UTI, dysphagia, dementia, seizure disorder.  7/11: intubated  7/19: PEG tube exchanged d/t inability to unclog  7/22: extubated  PMT continues to follow for [place]. Family wishes to continue with full scope of care at this time.   No family at bedside during visit today. Tube feed infusing at goal rate. Spoke with RN. Pt is tolerating tube feeding. No distension, emesis or diarrhea.    Admit wt: 69.9 kg Current wt: 70.3 kg  Edema: non-pitting generalized  Medications: SSI 0-9 units q4h, protonix, zinc sulfate 220 mg daily until 7/31  Labs: BUN 25, Cr 0.41, CBG's 105-137 x24 hours  Diet Order:   Diet Order             Diet NPO time specified  Diet effective now                  EDUCATION NEEDS:   No education needs have been  identified at this time  Skin:  Skin Assessment: Skin Integrity Issues: Skin Integrity Issues:: Stage III Stage III: R ischial tuberosity, L hip  Last BM:  7/24  Height:   Ht Readings from Last 1 Encounters:  07/18/21 _0  (1.93 m)    Weight:   Wt Readings from Last 1 Encounters:  08/01/21 70.3 kg   BMI:  Body mass index is 18.87 kg/m.  Estimated Nutritional Needs:   Kcal:  1900-2100  Protein:  120-140 gm  Fluid:  >/= 2 L  Terry Palmer, RDN, LDN Clinical Nutrition

## 2021-08-02 DIAGNOSIS — A419 Sepsis, unspecified organism: Secondary | ICD-10-CM | POA: Diagnosis not present

## 2021-08-02 DIAGNOSIS — J9601 Acute respiratory failure with hypoxia: Secondary | ICD-10-CM | POA: Diagnosis not present

## 2021-08-02 DIAGNOSIS — N390 Urinary tract infection, site not specified: Secondary | ICD-10-CM

## 2021-08-02 DIAGNOSIS — E43 Unspecified severe protein-calorie malnutrition: Secondary | ICD-10-CM

## 2021-08-02 DIAGNOSIS — G9341 Metabolic encephalopathy: Secondary | ICD-10-CM

## 2021-08-02 DIAGNOSIS — D72829 Elevated white blood cell count, unspecified: Secondary | ICD-10-CM

## 2021-08-02 DIAGNOSIS — G40909 Epilepsy, unspecified, not intractable, without status epilepticus: Secondary | ICD-10-CM

## 2021-08-02 DIAGNOSIS — R579 Shock, unspecified: Secondary | ICD-10-CM | POA: Diagnosis not present

## 2021-08-02 LAB — GLUCOSE, CAPILLARY
Glucose-Capillary: 104 mg/dL — ABNORMAL HIGH (ref 70–99)
Glucose-Capillary: 104 mg/dL — ABNORMAL HIGH (ref 70–99)
Glucose-Capillary: 116 mg/dL — ABNORMAL HIGH (ref 70–99)
Glucose-Capillary: 121 mg/dL — ABNORMAL HIGH (ref 70–99)
Glucose-Capillary: 125 mg/dL — ABNORMAL HIGH (ref 70–99)
Glucose-Capillary: 127 mg/dL — ABNORMAL HIGH (ref 70–99)
Glucose-Capillary: 128 mg/dL — ABNORMAL HIGH (ref 70–99)

## 2021-08-02 LAB — CBC
HCT: 28.8 % — ABNORMAL LOW (ref 39.0–52.0)
Hemoglobin: 8.7 g/dL — ABNORMAL LOW (ref 13.0–17.0)
MCH: 26.6 pg (ref 26.0–34.0)
MCHC: 30.2 g/dL (ref 30.0–36.0)
MCV: 88.1 fL (ref 80.0–100.0)
Platelets: 674 10*3/uL — ABNORMAL HIGH (ref 150–400)
RBC: 3.27 MIL/uL — ABNORMAL LOW (ref 4.22–5.81)
RDW: 20.7 % — ABNORMAL HIGH (ref 11.5–15.5)
WBC: 4.5 10*3/uL (ref 4.0–10.5)
nRBC: 0 % (ref 0.0–0.2)

## 2021-08-02 LAB — COMPREHENSIVE METABOLIC PANEL
ALT: 20 U/L (ref 0–44)
AST: 15 U/L (ref 15–41)
Albumin: 2.6 g/dL — ABNORMAL LOW (ref 3.5–5.0)
Alkaline Phosphatase: 142 U/L — ABNORMAL HIGH (ref 38–126)
Anion gap: 7 (ref 5–15)
BUN: 24 mg/dL — ABNORMAL HIGH (ref 6–20)
CO2: 23 mmol/L (ref 22–32)
Calcium: 8.5 mg/dL — ABNORMAL LOW (ref 8.9–10.3)
Chloride: 109 mmol/L (ref 98–111)
Creatinine, Ser: 0.41 mg/dL — ABNORMAL LOW (ref 0.61–1.24)
GFR, Estimated: >60 mL/min (ref >60)
Glucose, Bld: 114 mg/dL — ABNORMAL HIGH (ref 70–99)
Potassium: 4 mmol/L (ref 3.5–5.1)
Sodium: 139 mmol/L (ref 135–145)
Total Bilirubin: 0.2 mg/dL — ABNORMAL LOW (ref 0.3–1.2)
Total Protein: 6.9 g/dL (ref 6.5–8.1)

## 2021-08-02 LAB — MAGNESIUM: Magnesium: 2 mg/dL (ref 1.7–2.4)

## 2021-08-02 LAB — PHOSPHORUS: Phosphorus: 3.2 mg/dL (ref 2.5–4.6)

## 2021-08-02 MED ORDER — PHENYTOIN 125 MG/5ML PO SUSP
200.0000 mg | Freq: Two times a day (BID) | ORAL | Status: DC
  Administered 2021-08-02 – 2021-08-04 (×4): 200 mg
  Filled 2021-08-02 (×5): qty 8

## 2021-08-02 MED ORDER — JUVEN PO PACK
1.0000 | PACK | Freq: Two times a day (BID) | ORAL | Status: DC
  Administered 2021-08-03 – 2021-08-04 (×3): 1 via ORAL
  Filled 2021-08-02 (×3): qty 1

## 2021-08-02 NOTE — Progress Notes (Signed)
Pt's temp has been 99-100F over the past several hrs.  Assessment otherwise unremarkable, MD made aware. No new orders at this time.

## 2021-08-02 NOTE — Progress Notes (Addendum)
PROGRESS NOTE    Terry Palmer  FFM:[id number] DOB: 1975-08-19 DOA: 07/18/2021 PCP: Alvester Chou, NP    Brief Narrative:   46 year old male with past medical history of multiple sclerosis bedbound/quadriplegic since 2013 with PEG tube in place being cared by health care RN at home presented to the hospital with decreased urinary output from his suprapubic catheter.  At baseline patient is bedbound nonverbal and blinks eyes spontaneously.  At home patient was rattling with breathing and noted to have bradycardia by EMS with hypotension.  In the ED code sepsis was activated and patient was given IV fluids.  Patient was hypothermic and Bair hugger was placed.The patient was noted to have a 3x3cm wound on the right and a 3x3 wound on the left hip. Per family, his wounds are actually improved.  Patient was intubated in the ED.  Initial sodium was 131, albumin 2.6, AST 51, ALT 77, WBC 3.7, Hgb 9.9, and platelets 113.  ICU stay was complicated due to hypercarbic respiratory failure with left lower lobe pneumonia and continued encephalopathy.  Patient continued to be encephalopathic, despite being off sedation so neurology was consulted.  There was concern for  subclinical status epilepticus.  EEG showed epileptogenicity arising from the left hemisphere.  Patient was loaded with 3 g of keppra and continued 1500 mg BID.  7/13 EEG with status epilepticus. Started on dilantin 100 mg TID. Continued goals of care with family. Wish to continue all measures.    Sequence of events while in the hospital  7/11 Intubated, admitted with AMS, decreased UOP, bradycardia. On vanc, cefepime, metronidazole 7/12 Started zosyn 7/15 Finished Zosyn; neurology signed off recommended to continue Keppra and Dilantin 7/16 No further seizures on LTM in last 24hrs so d/c'd, weaning of vent and off pressors 7/19 Started ceftazidime 7/19 PEG tube exchanged 7/21 Finished ceftazidime 7/22 extubated    Assessment and  Plan: Principal Problem:   Shock ([place]) Active Problems:   Sepsis secondary to UTI ([place])   Leukocytosis   Acute respiratory failure with hypoxia ([place])   Seizure disorder ([place])   Protein-calorie malnutrition, severe ([place])   Acute metabolic encephalopathy   DNR (do not resuscitate) discussion   CAP (community acquired pneumonia)   Subclinical seizures/status epilepticus- Neurology followed the patient during hospitalization.  Currently on Keppra and Dilantin.  Was on long-term EEG which has been discontinued.  Neurology has signed off at this time  Multiple sclerosis/quadriplegia/bedbound status Continue supportive care with baclofen, Neurontin, Robinul   Acute hypercapnic respiratory failure requiring mechanical ventilation Patient intubated for airway protection due to metabolic encephalopathy.  Extubated 07/29/2021.  On nasal cannula oxygen at this time.  Diabetes mellitus type 2 Continue sliding insulin, Accu-Cheks.  Sepsis secondary UTI with shock. Improved. Has low grade fever today.   Neurogenic bladder On Suprapubic catheter.  Continue to monitor intake and output   Essential hypertension. Not on medications at home.   Sacral wound present on admission -Continue wound care   History of depression Patient is on Xanax, Valium, Wellbutrin at home which is currently on hold   Normocytic anemia -Hemoglobin at baseline around 9-10.  Latest hemoglobin of 8.7.   Goals of care -Palliative care consulted, patient is full code with full scope of care at this time.   DVT prophylaxis: enoxaparin (LOVENOX) injection 40 mg Start: 07/28/21 0930 SCDs Start: 07/18/21 1839   Code Status:     Code Status: Full Code  Disposition: With home health likely in 1-2 days if no fever  and remains stable.  Status is: Inpatient  Remains inpatient appropriate because: Pending clinical improvement.   Family Communication:  Spoke with the spouse on the phone and updated her about the  clinical condition of the patient and she believes that he is almost at his baseline.  Consultants:  Neurology  Procedures:  EEG Intubation, mechanical ventilation and extubation.  Antimicrobials:  None currently  Anti-infectives (From admission, onward)    Start     Dose/Rate Route Frequency Ordered Stop   07/26/21 1230  cefTAZidime (FORTAZ) 2 g in sodium chloride 0.9 % 100 mL IVPB        2 g 200 mL/hr over 30 Minutes Intravenous Every 8 hours 07/26/21 1139 07/28/21 2010   07/26/21 0945  ceFEPIme (MAXIPIME) 2 g in sodium chloride 0.9 % 100 mL IVPB  Status:  Discontinued        2 g 200 mL/hr over 30 Minutes Intravenous Every 8 hours 07/26/21 0854 07/26/21 1139   07/19/21 2000  vancomycin (VANCOREADY) IVPB 1250 mg/250 mL  Status:  Discontinued        1,250 mg 166.7 mL/hr over 90 Minutes Intravenous Every 12 hours 07/19/21 0936 07/19/21 0937   07/19/21 1800  piperacillin-tazobactam (ZOSYN) IVPB 3.375 g        3.375 g 12.5 mL/hr over 240 Minutes Intravenous Every 8 hours 07/19/21 1413 07/22/21 2119   07/19/21 0800  vancomycin (VANCOCIN) IVPB 1000 mg/200 mL premix  Status:  Discontinued        1,000 mg 200 mL/hr over 60 Minutes Intravenous Every 12 hours 07/18/21 1725 07/19/21 0936   07/19/21 0200  ceFEPIme (MAXIPIME) 2 g in sodium chloride 0.9 % 100 mL IVPB  Status:  Discontinued        2 g 200 mL/hr over 30 Minutes Intravenous Every 8 hours 07/18/21 1725 07/19/21 1328   07/18/21 1715  ceFEPIme (MAXIPIME) 2 g in sodium chloride 0.9 % 100 mL IVPB        2 g 200 mL/hr over 30 Minutes Intravenous  Once 07/18/21 1713 07/18/21 1905   07/18/21 1715  metroNIDAZOLE (FLAGYL) IVPB 500 mg        500 mg 100 mL/hr over 60 Minutes Intravenous  Once 07/18/21 1713 07/18/21 1937   07/18/21 1715  vancomycin (VANCOCIN) IVPB 1000 mg/200 mL premix        1,000 mg 200 mL/hr over 60 Minutes Intravenous  Once 07/18/21 1713 07/18/21 2009      Subjective: Today, patient was seen and examined at  bedside.  Patient is nonverbal.  No interval complaints reported.  Objective: Vitals:   08/01/21 1743 08/01/21 2020 08/02/21 0014 08/02/21 0400  BP: 102/73 113/75 (!) 103/58 107/64  Pulse: 92 87 91 87  Resp: 17 20 (!) 22 17  Temp:  99.1 F (37.3 C) 99.5 F (37.5 C) 99.8 F (37.7 C)  TempSrc:  Axillary Axillary Axillary  SpO2: 100% 100% 100% 100%  Weight:    72 kg  Height:        Intake/Output Summary (Last 24 hours) at 08/02/2021 1132 Last data filed at 08/02/2021 0435 Gross per 24 hour  Intake 1707.5 ml  Output 1550 ml  Net 157.5 ml   Filed Weights   07/31/21 0312 08/01/21 0439 08/02/21 0400  Weight: 70.3 kg 70.3 kg 72 kg    Physical Examination: Body mass index is 19.32 kg/m.   General: Thinly built, not in obvious distress, nasal cannula in place HENT:   No scleral pallor or icterus  noted. Oral mucosa is moist.  Chest:   Diminished breath sounds bilaterally. CVS: S1 &S2 heard. No murmur.  Regular rate and rhythm. Abdomen: Soft, nontender, nondistended.  Bowel sounds are heard.  PEG tube in place.  Suprapubic catheter in place. Extremities: No cyanosis, clubbing or edema.  Peripheral pulses are palpable.  Bilateral contracted extremities. Psych: Alert, awake, opens eyes spontaneously, nonverbal, CNS: Contracted extremities, opens eyes spontaneously, nonverbal Skin: Warm and dry.  No rashes noted.  Data Reviewed:   CBC: Recent Labs  Lab 07/29/21 0450 07/30/21 0421 07/31/21 0313 08/01/21 0135 08/02/21 0126  WBC 8.5 8.2 7.8 5.7 4.5  HGB 8.1* 8.0* 8.3* 8.7* 8.7*  HCT 26.1* 25.7* 27.3* 27.9* 28.8*  MCV 86.4 86.5 88.6 87.2 88.1  PLT 484* 554* 683* 698* 674*    Basic Metabolic Panel: Recent Labs  Lab 07/29/21 0450 07/30/21 0421 07/31/21 0313 08/01/21 0135 08/02/21 0126  NA 137 140 141 139 139  K 4.0 4.1 4.1 4.0 4.0  CL 105 110 109 108 109  CO2 '23 24 25 24 23  '$ GLUCOSE 121* 114* 100* 112* 114*  BUN 21* 22* 28* 25* 24*  CREATININE 0.44* 0.37* 0.45*  0.41* 0.41*  CALCIUM 8.5* 8.5* 8.6* 8.6* 8.5*  MG  --  2.2 2.1 2.1 2.0  PHOS  --  3.3 3.3 3.2 3.2    Liver Function Tests: Recent Labs  Lab 07/28/21 0428 08/02/21 0126  AST 17 15  ALT 21 20  ALKPHOS 132* 142*  BILITOT 0.5 0.2*  PROT 7.4 6.9  ALBUMIN 2.4* 2.6*     Radiology Studies: No results found.    LOS: 15 days    Flora Lipps, MD [facility] Available via Epic secure chat 7am-7pm After these hours, please refer to coverage provider listed on [email or web address] 08/02/2021, 11:32 AM

## 2021-08-02 NOTE — Progress Notes (Signed)
Dressings changed and pt moved to air mattress.  Mother called with questions regarding care plan, specifically regarding low grade fever and wound mgt.  She asked if we could please order Juven, MD paged with request. Also reassured her that we are continuing to monitor VS including temp as well as labs to determine need for further medical intervention.

## 2021-08-03 DIAGNOSIS — J9601 Acute respiratory failure with hypoxia: Secondary | ICD-10-CM | POA: Diagnosis not present

## 2021-08-03 DIAGNOSIS — A419 Sepsis, unspecified organism: Secondary | ICD-10-CM | POA: Diagnosis not present

## 2021-08-03 DIAGNOSIS — R579 Shock, unspecified: Secondary | ICD-10-CM | POA: Diagnosis not present

## 2021-08-03 DIAGNOSIS — G9341 Metabolic encephalopathy: Secondary | ICD-10-CM | POA: Diagnosis not present

## 2021-08-03 LAB — BASIC METABOLIC PANEL
Anion gap: 8 (ref 5–15)
BUN: 25 mg/dL — ABNORMAL HIGH (ref 6–20)
CO2: 23 mmol/L (ref 22–32)
Calcium: 8.8 mg/dL — ABNORMAL LOW (ref 8.9–10.3)
Chloride: 109 mmol/L (ref 98–111)
Creatinine, Ser: 0.44 mg/dL — ABNORMAL LOW (ref 0.61–1.24)
GFR, Estimated: >60 mL/min (ref >60)
Glucose, Bld: 104 mg/dL — ABNORMAL HIGH (ref 70–99)
Potassium: 3.7 mmol/L (ref 3.5–5.1)
Sodium: 140 mmol/L (ref 135–145)

## 2021-08-03 LAB — MAGNESIUM: Magnesium: 2 mg/dL (ref 1.7–2.4)

## 2021-08-03 LAB — CBC
HCT: 30.9 % — ABNORMAL LOW (ref 39.0–52.0)
Hemoglobin: 9.7 g/dL — ABNORMAL LOW (ref 13.0–17.0)
MCH: 27.1 pg (ref 26.0–34.0)
MCHC: 31.4 g/dL (ref 30.0–36.0)
MCV: 86.3 fL (ref 80.0–100.0)
Platelets: 691 10*3/uL — ABNORMAL HIGH (ref 150–400)
RBC: 3.58 MIL/uL — ABNORMAL LOW (ref 4.22–5.81)
RDW: 20.9 % — ABNORMAL HIGH (ref 11.5–15.5)
WBC: 4.1 10*3/uL (ref 4.0–10.5)
nRBC: 0 % (ref 0.0–0.2)

## 2021-08-03 LAB — PHOSPHORUS: Phosphorus: 3.1 mg/dL (ref 2.5–4.6)

## 2021-08-03 LAB — GLUCOSE, CAPILLARY
Glucose-Capillary: 114 mg/dL — ABNORMAL HIGH (ref 70–99)
Glucose-Capillary: 111 mg/dL — ABNORMAL HIGH (ref 70–99)
Glucose-Capillary: 105 mg/dL — ABNORMAL HIGH (ref 70–99)
Glucose-Capillary: 113 mg/dL — ABNORMAL HIGH (ref 70–99)
Glucose-Capillary: 86 mg/dL (ref 70–99)

## 2021-08-03 MED ORDER — LEVETIRACETAM 100 MG/ML PO SOLN: 1500.0000 mg | Freq: Two times a day (BID) | ORAL | 2 refills | Status: AC

## 2021-08-03 MED ORDER — PHENYTOIN 125 MG/5ML PO SUSP: 200.0000 mg | Freq: Two times a day (BID) | ORAL | 12 refills | Status: AC

## 2021-08-03 NOTE — Progress Notes (Addendum)
PROGRESS NOTE    Terry Palmer  TKZ:[id number] DOB: 1975/03/17 DOA: 07/18/2021 PCP: Terry Chou, NP    Brief Narrative:   46 year old male with past medical history of multiple sclerosis bedbound/quadriplegic since 2013 with PEG tube in place being cared by health care RN at home presented to the hospital with decreased urinary output from his suprapubic catheter.  At baseline patient is bedbound nonverbal and blinks eyes spontaneously.  At home patient was rattling with breathing and noted to have bradycardia by EMS with hypotension.  In the ED code sepsis was activated and patient was given IV fluids.  Patient was hypothermic and Bair hugger was placed.The patient was noted to have a 3x3cm wound on the right and a 3x3 wound on the left hip. Per family, his wounds are actually improved.  Patient was intubated in the ED.  Initial sodium was 131, albumin 2.6, AST 51, ALT 77, WBC 3.7, Hgb 9.9, and platelets 113.  ICU stay was complicated due to hypercarbic respiratory failure with left lower lobe pneumonia and continued encephalopathy.  Patient continued to be encephalopathic, despite being off sedation so neurology was consulted.  There was concern for  subclinical status epilepticus.  EEG showed epileptogenicity arising from the left hemisphere.  Patient was loaded with 3 g of keppra and continued 1500 mg BID.  7/13 EEG with status epilepticus. Started on dilantin 100 mg TID. Continued goals of care with family. Wish to continue all measures.    Sequence of events while in the hospital  7/11 Intubated, admitted with AMS, decreased UOP, bradycardia. On vanc, cefepime, metronidazole 7/12 Started zosyn 7/15 Finished Zosyn; neurology signed off recommended to continue Keppra and Dilantin 7/16 No further seizures on LTM in last 24hrs so d/c'd, weaning of vent and off pressors 7/19 Started ceftazidime 7/19 PEG tube exchanged 7/21 Finished ceftazidime 7/22 extubated    Assessment and  Plan: Principal Problem:   Shock ([place]) Active Problems:   Sepsis secondary to UTI ([place])   Leukocytosis   Acute respiratory failure with hypoxia ([place])   Seizure disorder ([place])   Protein-calorie malnutrition, severe ([place])   Acute metabolic encephalopathy   DNR (do not resuscitate) discussion   CAP (community acquired pneumonia)   Pressure injury of skin of hip   Subclinical seizures/status epilepticus- Neurology followed the patient during hospitalization.  Currently on Keppra and Dilantin.  Was on long-term EEG which has been discontinued.  Neurology has signed off at this time.  No mention of further seizures.  Multiple sclerosis/quadriplegia/bedbound status Continue supportive care with baclofen, Neurontin, Robinul   Acute hypercapnic respiratory failure requiring mechanical ventilation Patient intubated for airway protection due to metabolic encephalopathy.  Extubated 07/29/2021.  On room air at this time.  Diabetes mellitus type 2 Continue sliding insulin, Accu-Cheks.  Latest POC glucose of 105.  Sepsis secondary UTI with shock. Improved.  Completed course of antibiotic.  Temperature max of 99.8.   Neurogenic bladder On Suprapubic catheter.  Continue to monitor intake and output   Essential hypertension. Not on medications at home.  Blood pressure seems to be stable at this time.   Sacral wound present on admission -Continue wound care   History of depression Patient is on Xanax, Valium, Wellbutrin at home which is currently on hold   Normocytic anemia -Hemoglobin at baseline around 9-10.  Latest hemoglobin of 9.7.   Goals of care -Palliative care consulted, patient is full code with full scope of care at this time.  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  Present on admission.  Continue wound care.  Pressure Injury 08/11/20 Back Lateral;Left;Upper Stage 3 -  Full thickness tissue loss. Subcutaneous fat may be visible but bone, tendon or muscle are NOT  exposed. (Active)  08/11/20 1644  Location: Back  Location Orientation: Lateral;Left;Upper  Staging: Stage 3 -  Full thickness tissue loss. Subcutaneous fat may be visible but bone, tendon or muscle are NOT exposed.  Wound Description (Comments):   Present on Admission: Yes     Pressure Injury 07/18/21 Hip Left Stage 3 -  Full thickness tissue loss. Subcutaneous fat may be visible but bone, tendon or muscle are NOT exposed. PInk (Active)  07/18/21 2100  Location: Hip  Location Orientation: Left  Staging: Stage 3 -  Full thickness tissue loss. Subcutaneous fat may be visible but bone, tendon or muscle are NOT exposed.  Wound Description (Comments): PInk  Present on Admission: Yes     Pressure Injury 07/18/21 Hip Right Stage 3 -  Full thickness tissue loss. Subcutaneous fat may be visible but bone, tendon or muscle are NOT exposed. pink (Active)  07/18/21 2100  Location: Hip  Location Orientation: Right  Staging: Stage 3 -  Full thickness tissue loss. Subcutaneous fat may be visible but bone, tendon or muscle are NOT exposed.  Wound Description (Comments): pink  Present on Admission: Yes      DVT prophylaxis: enoxaparin (LOVENOX) injection 40 mg Start: 07/28/21 0930 SCDs Start: 07/18/21 1839   Code Status:     Code Status: Full Code  Disposition: Home on 08/04/2021.   TOC indicating that arrangements will be made on 08/04/2021.  Medically stable for disposition Status is: Inpatient  Remains inpatient appropriate because: Continued treatment, disposition plan Home tomorrow with arrangements.   Family Communication:   Spoke with the spouse and mother at bedside on 08/03/2021 and updated the plan for disposition and clinical condition of the patient.  Consultants:  Neurology  Procedures:  EEG Intubation, mechanical ventilation and extubation.  Antimicrobials:  None currently  Anti-infectives (From admission, onward)    Start     Dose/Rate Route Frequency Ordered Stop    07/26/21 1230  cefTAZidime (FORTAZ) 2 g in sodium chloride 0.9 % 100 mL IVPB        2 g 200 mL/hr over 30 Minutes Intravenous Every 8 hours 07/26/21 1139 07/28/21 2010   07/26/21 0945  ceFEPIme (MAXIPIME) 2 g in sodium chloride 0.9 % 100 mL IVPB  Status:  Discontinued        2 g 200 mL/hr over 30 Minutes Intravenous Every 8 hours 07/26/21 0854 07/26/21 1139   07/19/21 2000  vancomycin (VANCOREADY) IVPB 1250 mg/250 mL  Status:  Discontinued        1,250 mg 166.7 mL/hr over 90 Minutes Intravenous Every 12 hours 07/19/21 0936 07/19/21 0937   07/19/21 1800  piperacillin-tazobactam (ZOSYN) IVPB 3.375 g        3.375 g 12.5 mL/hr over 240 Minutes Intravenous Every 8 hours 07/19/21 1413 07/22/21 2119   07/19/21 0800  vancomycin (VANCOCIN) IVPB 1000 mg/200 mL premix  Status:  Discontinued        1,000 mg 200 mL/hr over 60 Minutes Intravenous Every 12 hours 07/18/21 1725 07/19/21 0936   07/19/21 0200  ceFEPIme (MAXIPIME) 2 g in sodium chloride 0.9 % 100 mL IVPB  Status:  Discontinued        2 g 200 mL/hr over 30 Minutes Intravenous Every 8 hours 07/18/21 1725 07/19/21 1328  07/18/21 1715  ceFEPIme (MAXIPIME) 2 g in sodium chloride 0.9 % 100 mL IVPB        2 g 200 mL/hr over 30 Minutes Intravenous  Once 07/18/21 1713 07/18/21 1905   07/18/21 1715  metroNIDAZOLE (FLAGYL) IVPB 500 mg        500 mg 100 mL/hr over 60 Minutes Intravenous  Once 07/18/21 1713 07/18/21 1937   07/18/21 1715  vancomycin (VANCOCIN) IVPB 1000 mg/200 mL premix        1,000 mg 200 mL/hr over 60 Minutes Intravenous  Once 07/18/21 1713 07/18/21 2009      Subjective: Today, patient was seen and examined at bedside.  Nonverbal.  No interval complaints reported.   Objective: Vitals:   08/02/21 2332 08/03/21 0100 08/03/21 0500 08/03/21 0513  BP: 137/72   117/72  Pulse: 92   93  Resp: 20   18  Temp: 98.4 F (36.9 C)   98.8 F (37.1 C)  TempSrc: Oral   Oral  SpO2: 99% 99%  97%  Weight:   86.2 kg   Height:         Intake/Output Summary (Last 24 hours) at 08/03/2021 0809 Last data filed at 08/03/2021 3875 Gross per 24 hour  Intake 3200 ml  Output 2100 ml  Net 1100 ml   Filed Weights   08/01/21 0439 08/02/21 0400 08/03/21 0500  Weight: 70.3 kg 72 kg 86.2 kg    Physical Examination: Body mass index is 23.13 kg/m.   General: Thinly built, not in obvious distress HENT:   No scleral pallor or icterus noted. Oral mucosa is moist.  Chest:   Diminished breath sounds bilaterally. No crackles or wheezes.  CVS: S1 &S2 heard. No murmur.  Regular rate and rhythm. Abdomen: Soft, nontender, nondistended.  Bowel sounds are heard.  PEG tube in place. Extremities: Contracted extremities. Psych: Alert, awake but nonverbal, opens eyes spontaneously, CNS: Opens eyes spontaneously, contracted extremities, Skin: Warm and dry, right and left hip ulcers.  Data Reviewed:   CBC: Recent Labs  Lab 07/30/21 0421 07/31/21 0313 08/01/21 0135 08/02/21 0126 08/03/21 0138  WBC 8.2 7.8 5.7 4.5 4.1  HGB 8.0* 8.3* 8.7* 8.7* 9.7*  HCT 25.7* 27.3* 27.9* 28.8* 30.9*  MCV 86.5 88.6 87.2 88.1 86.3  PLT 554* 683* 698* 674* 691*    Basic Metabolic Panel: Recent Labs  Lab 07/30/21 0421 07/31/21 0313 08/01/21 0135 08/02/21 0126 08/03/21 0138  NA 140 141 139 139 140  K 4.1 4.1 4.0 4.0 3.7  CL 110 109 108 109 109  CO2 '24 25 24 23 23  '$ GLUCOSE 114* 100* 112* 114* 104*  BUN 22* 28* 25* 24* 25*  CREATININE 0.37* 0.45* 0.41* 0.41* 0.44*  CALCIUM 8.5* 8.6* 8.6* 8.5* 8.8*  MG 2.2 2.1 2.1 2.0 2.0  PHOS 3.3 3.3 3.2 3.2 3.1    Liver Function Tests: Recent Labs  Lab 07/28/21 0428 08/02/21 0126  AST 17 15  ALT 21 20  ALKPHOS 132* 142*  BILITOT 0.5 0.2*  PROT 7.4 6.9  ALBUMIN 2.4* 2.6*     Radiology Studies: No results found.    LOS: 16 days    Flora Lipps, MD [facility] Available via Epic secure chat 7am-7pm After these hours, please refer to coverage provider listed on  [email or web address] 08/03/2021, 8:09 AM

## 2021-08-03 NOTE — Care Management Important Message (Signed)
Important Message  Patient Details  Name: Terry Palmer MRN: [id number] Date of Birth: [birth year]   Medicare Important Message Given:  Yes     Shelda Altes 08/03/2021, 12:19 PM

## 2021-08-03 NOTE — TOC Transition Note (Signed)
Transition of Care (TOC) - CM/SW Discharge Note [name] RN, BSN Transitions of Care Unit 4E- RN Case Manager See Treatment Team for direct phone #    Patient Details  Name: Terry Palmer MRN: [id number] Date of Birth: [birth year]  [facility]) CM/SW Contact:  Dawayne Patricia, RN Phone Number: 08/03/2021, 11:12 AM   Clinical Narrative:    Pt stable for transition home, from home w/ wife and has private duty nursing.  Call made to wife- [name] to discuss return home. Per wife pt has all needed DME and will transport home via EMS- address confirmed in epic for transport.  Informed wife that discharge order has been written- pt is medically stable per MD to return home. Wife states she will not have her private duty nursing in place today if pt returns home today and needs today to make sure nursing is in place for return home. Per wife she is working with current agency as well as speaking with a new agency -[facility] regarding the private duty nursing needs.  Explained to wife that she will need to contact the agencies today and make sure staffing is in place for patient to return home tomorrow- [name] voiced understanding and is in agreement to plan for pt to return home tomorrow when PD nursing can be in place.  TOC will f/u tomorrow and set up [facility] transport.   MD and bedside RN made aware of the above and need to hold discharge till tomorrow in order to give wife time to have things in place at home.    Final next level of care: Home/Self Care Barriers to Discharge: Barriers Resolved   Patient Goals and CMS Choice Patient states their goals for this hospitalization and ongoing recovery are:: return home w/ wife CMS Medicare.gov Compare Post Acute Care list provided to:: Patient Represenative (must comment) (wife- [name]) Choice offered to / list presented to : Spouse [name])  Discharge Placement               Home w/ Private duty nursing        Discharge  Plan and Services   Discharge Planning Services: CM Consult Post Acute Care Choice: Resumption of Svcs/PTA Provider (Pt has private duty nursing in the home)          DME Arranged: N/A DME Agency: NA       HH Arranged: NA [place] Agency: Other - See comment (Private duty nursing)        Social Determinants of Health (SDOH) Interventions     Readmission Risk Interventions    08/03/2021   11:12 AM  Readmission Risk Prevention Plan  Transportation Screening Complete  PCP or Specialist Appt within 3-5 Days Complete  HRI or Home Care Consult Complete  Social Work Consult for [place] Planning/Counseling Complete  Palliative Care Screening Complete  Medication Review Press photographer) Complete

## 2021-08-03 NOTE — Plan of Care (Signed)
?  Problem: Clinical Measurements: ?Goal: Will remain free from infection ?Outcome: Progressing ?  ?

## 2021-08-03 NOTE — Plan of Care (Signed)
  Problem: Coping: Goal: Ability to adjust to condition or change in health will improve Outcome: Not Progressing

## 2021-08-04 DIAGNOSIS — A419 Sepsis, unspecified organism: Secondary | ICD-10-CM | POA: Diagnosis not present

## 2021-08-04 DIAGNOSIS — R579 Shock, unspecified: Secondary | ICD-10-CM | POA: Diagnosis not present

## 2021-08-04 DIAGNOSIS — G9341 Metabolic encephalopathy: Secondary | ICD-10-CM | POA: Diagnosis not present

## 2021-08-04 DIAGNOSIS — J9601 Acute respiratory failure with hypoxia: Secondary | ICD-10-CM | POA: Diagnosis not present

## 2021-08-04 LAB — GLUCOSE, CAPILLARY
Glucose-Capillary: 114 mg/dL — ABNORMAL HIGH (ref 70–99)
Glucose-Capillary: 91 mg/dL (ref 70–99)
Glucose-Capillary: 95 mg/dL (ref 70–99)

## 2021-08-04 NOTE — TOC Transition Note (Signed)
Transition of Care (TOC) - CM/SW Discharge Note [name] RN, BSN Transitions of Care Unit 4E- RN Case Manager See Treatment Team for direct phone #    Patient Details  Name: BIRCH FARINO MRN: [id number] Date of Birth: 03-15-1975  [facility]) CM/SW Contact:  Dawayne Patricia, RN Phone Number: 08/04/2021, 10:29 AM   Clinical Narrative:    Pt remains stable for transition home, call made to wife- [name] this am to confirm private duty nursing is in place- [name] confirms she is ready for pt to return home today and transport can be called.  [name] to call [facility] to alert them to send RN.   1020-Call made to [facility] and transport has been scheduled- paperwork placed on shadow chart. ETA for transport is within the hour. Bedside RN updated so pt can be ready.   Received call from [facility] w/ [place] had provided them this CM contact #- requesting copy of d/c summary - d/c summary faxed to [facility] for private duty nursing needs- fax #[phone]   Final next level of care: Home/Self Care Barriers to Discharge: Barriers Resolved   Patient Goals and CMS Choice Patient states their goals for this hospitalization and ongoing recovery are:: return home w/ wife CMS Medicare.gov Compare Post Acute Care list provided to:: Patient Represenative (must comment) (wife- [name]) Choice offered to / list presented to : Spouse [name])  Discharge Placement                 Home w/ Private Duty Nursing      Discharge Plan and Services   Discharge Planning Services: CM Consult Post Acute Care Choice: Resumption of Svcs/PTA Provider (Pt has private duty nursing in the home)          DME Arranged: N/A DME Agency: NA       HH Arranged: NA [place] Agency: Other - See comment (Private duty nursing)        Social Determinants of Health (SDOH) Interventions     Readmission Risk Interventions    08/03/2021   11:12 AM  Readmission  Risk Prevention Plan  Transportation Screening Complete  PCP or Specialist Appt within 3-5 Days Complete  HRI or [place] Complete  Social Work Consult for [place] Planning/Counseling Complete  Palliative Care Screening Complete  Medication Review Press photographer) Complete

## 2021-08-04 NOTE — Progress Notes (Signed)
Pt discharged to home via [place].  AVS reviewed and sent home with [facility].  Pt left with all of their personal belongings.  Pt taken off telemetry and CCMD notified.

## 2021-08-04 NOTE — Discharge Summary (Signed)
Physician Discharge Summary   Patient: Terry Palmer MRN: [id number] DOB: January 03, 1976  Admit date:     07/18/2021  Discharge date: 08/04/21  Discharge Physician: Flora Lipps   PCP: Alvester Chou, NP   Recommendations at discharge:   Follow-up with your primary care provider in 1 week.  Check blood work including CBC BMP LFT in the next few Follow-up with your neurologist as outpatient.  Discharge Diagnoses: Principal Problem:   Shock ([place]) Active Problems:   Sepsis secondary to UTI ([place])   Leukocytosis   Acute respiratory failure with hypoxia (H[facility])   Seizure disorder (HCC)   Protein-calorie malnutrition, severe (HCC)   Acute metabolic encephalopathy   DNR (do not resuscitate) discussion   CAP (community acquired pneumonia)   Pressure injury of skin of hip  Resolved Problems:   * No resolved hospital problems. *  Hospital Course: 46 year old male with past medical history of multiple sclerosis bedbound/quadriplegic since 2013 with PEG tube in place being cared by health care RN at home presented to the hospital with decreased urinary output from his suprapubic catheter.  At baseline patient is bedbound nonverbal and blinks eyes spontaneously.  At home patient was rattling with breathing and noted to have bradycardia by EMS with hypotension.  In the ED code sepsis was activated and patient was given IV fluids.  Patient was hypothermic and Bair hugger was placed.The patient was noted to have a 3x3cm wound on the right and a 3x3 wound on the left hip. Per family, his wounds are actually improved.  Patient was intubated in the ED.  Initial sodium was 131, albumin 2.6, AST 51, ALT 77, WBC 3.7, Hgb 9.9, and platelets 113.  ICU stay was complicated due to hypercarbic respiratory failure with left lower lobe pneumonia and continued encephalopathy.  Patient continued to be encephalopathic, despite being off sedation so neurology was consulted.  There was concern for  subclinical status  epilepticus.  EEG showed epileptogenicity arising from the left hemisphere.  Patient was loaded with 3 g of keppra and continued 1500 mg BID.  7/13 EEG with status epilepticus. Started on dilantin 100 mg TID.   Sequence of events while in the hospital  7/11 Intubated, admitted with AMS, decreased UOP, bradycardia. On vanc, cefepime, metronidazole 7/12 Started zosyn 7/15 Finished Zosyn; neurology signed off recommended to continue Keppra and Dilantin 7/16 No further seizures on LTM in last 24hrs so d/c'd, weaning of vent and off pressors 7/19 Started ceftazidime 7/19 PEG tube exchanged 7/21 Finished ceftazidime 7/22 extubated  Assessment and Plan:  Subclinical seizures/status epilepticus- Neurology followed the patient during hospitalization.  Patient will continue Keppra and Dilantin.  Patient underwent a long-term EEG during hospitalization as well.   Multiple sclerosis/quadriplegia/bedbound status Continue supportive care with baclofen, Neurontin   Acute hypercapnic respiratory failure requiring mechanical ventilation Patient was intubated for airway protection due to metabolic encephalopathy.  Extubated 07/29/2021.  Patient was at his baseline prior to discharge.   Sepsis secondary UTI with shock. Improved.  Completed course of antibiotic.  Temperature max of 98.5 prior to discharge   Neurogenic bladder On Suprapubic catheter.  We will continue on discharge.   Essential hypertension. Not on medications at home.  Blood pressure seems to be stable at this time.   Sacral wound present on admission -Continue wound care   History of depression Patient is on Xanax, Valium, Wellbutrin at home    Normocytic anemia -Hemoglobin at baseline around 9-10.  Latest hemoglobin of 9.7.   Goals of  care -Palliative care consulted, patient is full code with full scope of care at this time.   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  Present on admission.  Continue wound  care.  Consultants:  Neurology Palliative care Critical care  Procedures performed:   EEG Intubation, mechanical ventilation and extubation.  Disposition: Home Diet recommendation:  Discharge Diet Orders (From admission, onward)     Start     Ordered   08/03/21 0000  Diet - low sodium heart healthy        08/03/21 0928           Cardiac diet DISCHARGE MEDICATION: Allergies as of 08/04/2021   No Known Allergies      Medication List     STOP taking these medications    amoxicillin-clavulanate 875-125 MG tablet Commonly known as: AUGMENTIN   doxycycline 25 MG/5ML Susr Commonly known as: VIBRAMYCIN       TAKE these medications    acetaminophen 500 MG tablet Commonly known as: TYLENOL Take 1 tablet (500 mg total) by mouth every 8 (eight) hours as needed for fever (or pain). What changed: when to take this   antiseptic oral rinse Liqd 15 mLs by Mouth Rinse route 5 (five) times daily as needed.   baclofen 20 MG tablet Commonly known as: LIORESAL Place 1 tablet (20 mg total) into feeding tube 4 (four) times daily.   bisacodyl 10 MG suppository Commonly known as: DULCOLAX Place 10 mg rectally once as needed for moderate constipation.   buPROPion 75 MG tablet Commonly known as: WELLBUTRIN Place 1 tablet (75 mg total) into feeding tube 3 (three) times daily.   diazepam 5 MG tablet Commonly known as: VALIUM Place 1 tablet (5 mg total) into feeding tube at bedtime as needed for anxiety (sleep). Hold until completion of Ativan What changed: when to take this   famotidine 40 MG/5ML suspension Commonly known as: PEPCID Take 20 mg by mouth 2 (two) times daily.   fluconazole 10 MG/ML suspension Commonly known as: DIFLUCAN Place 10 mLs into feeding tube once a week.   gabapentin 250 MG/5ML solution Commonly known as: NEURONTIN Place 2.5 mLs into feeding tube in the morning, at noon, and at bedtime.   Gas Relief Extra Strength 125 MG chewable  tablet Generic drug: simethicone 1-2 tablets by Per J Tube route 2 (two) times daily as needed for flatulence.   guaifenesin 100 MG/5ML syrup Commonly known as: ROBITUSSIN Take 200 mg by mouth 3 (three) times daily as needed for cough.   HYDROmorphone 2 MG tablet Commonly known as: DILAUDID Take 1 mg by mouth every 2 (two) hours as needed for pain.   ipratropium-albuterol 0.5-2.5 (3) MG/3ML Soln Commonly known as: DUONEB Take 3 mLs by nebulization See admin instructions. Four times daily and as needed for shortness of breath What changed:  when to take this additional instructions   levETIRAcetam 100 MG/ML solution Commonly known as: KEPPRA Place 15 mLs (1,500 mg total) into feeding tube 2 (two) times daily. What changed:  how much to take when to take this additional instructions   LORazepam Intensol 2 MG/ML concentrated solution Generic drug: LORazepam 0.25 mLs by Per J Tube route 3 (three) times daily as needed for anxiety.   metroNIDAZOLE 500 MG tablet Commonly known as: FLAGYL Take 500 mg by mouth daily as needed (UTI).   nystatin powder Commonly known as: MYCOSTATIN/NYSTOP Apply 1 Application topically 2 (two) times daily as needed (irritation).   phenytoin 125 MG/5ML  suspension Commonly known as: DILANTIN Place 8 mLs (200 mg total) into feeding tube 2 (two) times daily.   polyethylene glycol 17 g packet Commonly known as: MIRALAX / GLYCOLAX Place 17 g into feeding tube daily. What changed: when to take this   polyvinyl alcohol 1.4 % ophthalmic solution Commonly known as: LIQUIFILM TEARS Place 1 drop into both eyes 3 (three) times daily. What changed: when to take this               Discharge Care Instructions  (From admission, onward)           Start     Ordered   08/03/21 0000  Discharge wound care:       Comments: Wound care to right ischial tuberosity and left hip chronic, nonhealing Stage 3 pressure injuries: Cleanse with soap and  water, rinse and dry. Cover/fill will silver hydrofiber (Aquacel Ag+ Advantage, [employer] # [id number]), top with dry gauze and secure with silicone foam bordered dressings. Change Aquacel daily, may reuse silicone foam for up to 3 days, change PRN soiling.   08/03/21 0928            Follow-up Information     Alvester Chou, NP Follow up in 1 week(s).   Specialty: Nurse Practitioner Contact information: [address] [place] [address] [phone]                Subjective Today, patient was seen and examined at bedside.  Nonverbal.  No interval complaints reported.   Discharge Exam: Filed Weights   08/02/21 0400 08/03/21 0500 08/04/21 0552  Weight: 72 kg 86.2 kg 89.4 kg      08/04/2021    7:56 AM 08/04/2021    5:52 AM 08/04/2021    3:55 AM  Vitals with BMI  Weight  197 lbs 1 oz   BMI  24   Systolic 315  176  Diastolic 62  72  Pulse 71  85   Body mass index is 23.99 kg/m.   General: Thinly built, not in obvious distress HENT:   No scleral pallor or icterus noted. Oral mucosa is moist.  Chest:  Diminished breath sounds bilaterally. No crackles or wheezes.  CVS: S1 &S2 heard. No murmur.  Regular rate and rhythm. Abdomen: Soft, nontender, nondistended.  Bowel sounds are heard.  PEG tube in place Extremities: Contracted extremities Psych: Alert, awake and nonverbal.  Opens eyes spontaneously CNS:  No cranial nerve deficits.  Contracted extremities Skin: Warm and dry.  Bilateral hip ulcers.   Condition at discharge: good  The results of significant diagnostics from this hospitalization (including imaging, microbiology, ancillary and laboratory) are listed below for reference.   Imaging Studies: DG CHEST PORT 1 VIEW  Result Date: 07/31/2021 CLINICAL DATA:  5626 acute respiratory failure EXAM: PORTABLE CHEST 1 VIEW COMPARISON:  July 19, 2021 FINDINGS: There has been interval extubation and removal of the NG tube. Right transjugular central venous catheter with its  tip at the mid SVC region, stable. Cardiomediastinal silhouette is stable. Low lung volume. There has been near-complete resolution of the left perihilar consolidative opacity and resolution of the bibasilar atelectasis since the previous study. No focal consolidation or pleural effusion. The visualized skeletal structures are unremarkable. IMPRESSION: There has been interval extubation and removal of the NG tube. There has been near-complete resolution of the left perihilar opacity and resolution of the bibasilar atelectasis in the interim. Electronically Signed   By: Frazier Richards M.D.   On: 07/31/2021  10:20   IR REPLACE G-TUBE SIMPLE WO FLUORO  Result Date: 07/26/2021 INDICATION: Patient with 18 Fr balloon retention gastrostomy tube placed at outside hospital in 2013, per spouse recurrent clogging and is currently clogged during this admission. Request for exchange of gastrostomy tube. EXAM: BEDSIDE REPLACEMENT OF GASTROSTOMY TUBE COMPARISON:  None Available. MEDICATIONS: None. CONTRAST:  None FLUOROSCOPY TIME:  None COMPLICATIONS: None immediate. PROCEDURE: Informed written consent was obtained from the patient's wife [name] after a discussion of the risks, benefits and alternatives to treatment. Questions regarding the procedure were encouraged and answered. A timeout was performed prior to the initiation of the procedure. The external portion of the existing gastrostomy tube was cut decompressing the retention balloon. Through the existing tract a new 20-French balloon inflatable gastrostomy tube was placed. The balloon was inflated with saline t and pulled against the anterior inner lumen of the stomach and the external disc was cinched. KUB pending to confirm location prior to use. IMPRESSION: Successful bedside replacement of a new 20-French gastrostomy tube. Candiss Norse, PA-C Electronically Signed   By: Ruthann Cancer M.D.   On: 07/26/2021 16:22   DG ABDOMEN PEG TUBE LOCATION  Result Date:  07/26/2021 CLINICAL DATA:  Peg tube placement EXAM: ABDOMEN - 1 VIEW COMPARISON:  07/18/2021 FINDINGS: 30 mL Gastrografin contrast injected into patient's gastrostomy tube. Contrast opacifies the stomach and duodenum. No gross extravasation is seen. Probable descending esophageal tube with tip overlying the proximal stomach. IMPRESSION: Gastrostomy tube projects over the body of the stomach. No gross extravasation Electronically Signed   By: Donavan Foil M.D.   On: 07/26/2021 16:01   Overnight EEG with video  Result Date: 07/20/2021 Lora Havens, MD     07/22/2021  6:36 AM Patient Name: Terry Palmer MRN: [id number] Epilepsy Attending: Lora Havens Referring Physician/Provider: Lora Havens, MD Duration: 07/19/2021 1329 to 07/20/2021 1329  Patient history: 46yo m with h/o seizure, ams. EEG to evaluate for seizure.  Level of alertness:  lethargic  AEDs during EEG study: LEV  Technical aspects: This EEG study was done with scalp electrodes positioned according to the 10-20 International system of electrode placement. Electrical activity was acquired at a sampling rate of '500Hz'$  and reviewed with a high frequency filter of '70Hz'$  and a low frequency filter of '1Hz'$ . EEG data were recorded continuously and digitally stored.  Description: EEG showed 6 to 8 Hz theta-alpha activity in right hemisphere and low amplitude 2 to 3 Hz delta slowing in left hemisphere.There are qasi-periodic discharges with overriding fast activity in left hemisphere every 2 -9 seconds. Seizures without clinical Signs were also noted over right frontal region, average 2 seizures per hour, lasting about 4 minutes each.  During the seizure, EEG initially showed 9 to 10 Hz alpha activity in right frontal region which then involved all of right hemisphere and left frontal region and evolved in frequency to 5 to 6 Hz theta slowing.  Hyperventilation and photic stimulation were not performed.    ABNORMALITY -Seizure without clinical signs,  right frontal region -Periodic discharges with overriding fast activity, left hemisphere -Continuous slow, generalized and lateralized left hemisphere  IMPRESSION: This study showed seizures without clinical signs arising from right frontal region, average 2 seizures per hour, lasting about 4 minutes each.  Additionally there is evidence of epileptogenicity and cortical dysfunction arising from left hemisphere with increased risk of seizure recurrence.  Lastly, There is moderate to severe diffuse encephalopathy, nonspecific etiology. Priyanka [name]   EEG adult  Result Date: 07/19/2021 Lora Havens, MD     07/19/2021  1:29 PM Patient Name: Terry Palmer MRN: [id number] Epilepsy Attending: Lora Havens Referring Physician/Provider: Candee Furbish, MD Date: 07/19/2021 Duration: 21.51 mins Patient history: 46yo m with h/o seizure, ams. EEG to evaluate for seizure. Level of alertness:  lethargic AEDs during EEG study: LEV Technical aspects: This EEG study was done with scalp electrodes positioned according to the 10-20 International system of electrode placement. Electrical activity was acquired at a sampling rate of '500Hz'$  and reviewed with a high frequency filter of '70Hz'$  and a low frequency filter of '1Hz'$ . EEG data were recorded continuously and digitally stored. Description: EEG showed 6 to 8 Hz theta-alpha activity in right hemisphere and low amplitude 2 to 3 Hz delta slowing in left hemisphere. Additionally there are qasi-periodic discharges with overriding fast activity in left hemisphere every 2 -9 seconds.  hyperventilation and photic stimulation were not performed.   ABNORMALITY -Periodic discharges with overriding fast activity, left hemisphere -Continuous slow, generalized and lateralized left hemisphere IMPRESSION: This study showed evidence of epileptogenicity and cortical dysfunction arising from left hemisphere with increased risk of seizure recurrence.  Additionally there is moderate to  severe diffuse encephalopathy, nonspecific etiology. Dr. Tamala Julian and Dr. Rory Percy were notified.  Recommend LTM EEG monitoring. Center   CT HEAD WO CONTRAST (5MM)  Result Date: 07/19/2021 CLINICAL DATA:  Syncope/presyncope, cerebrovascular cause suspected EXAM: CT HEAD WITHOUT CONTRAST TECHNIQUE: Contiguous axial images were obtained from the base of the skull through the vertex without intravenous contrast. RADIATION DOSE REDUCTION: This exam was performed according to the departmental dose-optimization program which includes automated exposure control, adjustment of the mA and/or kV according to patient size and/or use of iterative reconstruction technique. COMPARISON:  CT 02/08/2016. FINDINGS: Brain: Severe, markedly advanced for age generalized cerebral atrophy. No evidence of acute large vascular territory infarct, mass lesion, midline shift, or acute hemorrhage. Ex vacuo ventricular dilation without evidence of hydrocephalus. Vascular: No hyperdense vessel identified. Skull: No acute fracture. Sinuses/Orbits: Scattered paranasal sinus mucosal thickening. No acute orbital findings. Other: No mastoid effusions. IMPRESSION: 1. Severe, markedly advanced for age generalized cerebral atrophy (ICD10-G31.9). 2. No evidence of superimposed acute intracranial abnormality. Electronically Signed   By: Margaretha Sheffield M.D.   On: 07/19/2021 09:53   DG CHEST PORT 1 VIEW  Result Date: 07/19/2021 CLINICAL DATA:  Pneumonia. EXAM: PORTABLE CHEST 1 VIEW COMPARISON:  One-view chest x-ray 07/18/2021 FINDINGS: Endotracheal tube terminates 2.5 cm above the carina. Right IJ line is stable. Side port of the NG tube is in the stomach. Defibrillator pads are in place. Heart is enlarged. Mild pulmonary vascular congestion is present. Focal left perihilar opacity is less consolidated than on yesterday's study. Lung volumes are low with persistent bibasilar opacities. IMPRESSION: 1. Cardiomegaly and mild pulmonary vascular  congestion. 2. Improving left perihilar opacity. 3. Persistent bibasilar opacities. Infection is not excluded. Electronically Signed   By: San Morelle M.D.   On: 07/19/2021 08:47   DG CHEST PORT 1 VIEW  Result Date: 07/18/2021 CLINICAL DATA:  Central line placement. EXAM: PORTABLE CHEST 1 VIEW COMPARISON:  Radiograph earlier today. FINDINGS: New right internal jugular central line tip overlies the atrial caval junction. No pneumothorax. Endotracheal and enteric tubes remain in place. Persistent low lung volumes. Left lung base and perihilar opacities are stable. Additional right infrahilar opacity. Cannot exclude left pleural effusion IMPRESSION: 1. New right internal jugular central line with tip overlying the atrial caval junction.  No pneumothorax. 2. Appropriate positioning of the endotracheal and enteric tubes. 3. Stable bibasilar and left perihilar opacities suspicious for multifocal pneumonia. Electronically Signed   By: Keith Rake M.D.   On: 07/18/2021 21:42   DG Chest Portable 1 View  Result Date: 07/18/2021 CLINICAL DATA:  Shortness of breath, status post intubation EXAM: PORTABLE CHEST 1 VIEW COMPARISON:  Previous studies including the examination done earlier today FINDINGS: There is interval placement of endotracheal tube with its tip 2.2 cm above the carina. Enteric tube is noted traversing the esophagus with its distal portion in the stomach. Transverse diameter of heart is slightly increased. There is poor inspiration. Central pulmonary vessels are less prominent. There are patchy infiltrates in the left parahilar region and both lower lung fields. There is blunting of left lateral CP angle. There is no pneumothorax. There is possible gaseous distention of hepatic flexure in right upper quadrant of abdomen. IMPRESSION: There are infiltrates in left mid and both lower lung fields suggesting atelectasis/pneumonia. Electronically Signed   By: Elmer Picker M.D.   On:  07/18/2021 19:11   DG Abdomen 1 View  Result Date: 07/18/2021 CLINICAL DATA:  NG tube placement. EXAM: ABDOMEN - 1 VIEW COMPARISON:  CT abdomen and pelvis 08/13/2020. FINDINGS: Nasogastric tube tip is at the level of the mid stomach. There is diffuse gaseous distention of the colon similar to the prior exam. IMPRESSION: Nasogastric tube tip at the level of the mid stomach. Diffuse gaseous distention of the colon. Please correlate clinically for colonic ileus or obstruction. Electronically Signed   By: Ronney Asters M.D.   On: 07/18/2021 18:45   DG Chest Portable 1 View  Result Date: 07/18/2021 CLINICAL DATA:  Altered unresponsive EXAM: PORTABLE CHEST 1 VIEW COMPARISON:  08/10/2020 FINDINGS: Low lung volumes. Central bronchovascular crowding. Consolidation at the left base with patchy airspace disease in the left perihilar region. Partially obscured cardiomediastinal silhouette. No pneumothorax. Amorphous opacity overlying right upper quadrant, possible artifact. IMPRESSION: 1. Hypoventilatory change. Consolidation at left lung base with heterogeneous left greater than right airspace disease, potentially due to pneumonia. Electronically Signed   By: Donavan Foil M.D.   On: 07/18/2021 17:29    Microbiology: Results for orders placed or performed during the hospital encounter of 07/18/21  Urine Culture     Status: Abnormal   Collection Time: 07/18/21  5:07 PM   Specimen: Urine, Suprapubic  Result Value Ref Range Status   Specimen Description URINE, SUPRAPUBIC  Final   Special Requests   Final    NONE Performed at [facility], [address], [place], [place] [address]    Culture 2,000 COLONIES/mL PROTEUS MIRABILIS (A)  Final   Report Status 07/21/2021 FINAL  Final   Organism ID, Bacteria PROTEUS MIRABILIS (A)  Final      Susceptibility   Proteus mirabilis - MIC*    AMPICILLIN >=32 RESISTANT Resistant     CEFAZOLIN 8 SENSITIVE Sensitive     CEFEPIME <=0.12 SENSITIVE Sensitive      CEFTRIAXONE <=0.25 SENSITIVE Sensitive     CIPROFLOXACIN 2 RESISTANT Resistant     GENTAMICIN >=16 RESISTANT Resistant     IMIPENEM 4 SENSITIVE Sensitive     NITROFURANTOIN 128 RESISTANT Resistant     TRIMETH/SULFA >=320 RESISTANT Resistant     AMPICILLIN/SULBACTAM >=32 RESISTANT Resistant     * 2,000 COLONIES/mL PROTEUS MIRABILIS  Resp Panel by RT-PCR (Flu A&B, Covid) Anterior Nasal Swab     Status: None   Collection Time:  07/18/21  5:08 PM   Specimen: Anterior Nasal Swab  Result Value Ref Range Status   SARS Coronavirus 2 by RT PCR NEGATIVE NEGATIVE Final    Comment: (NOTE) SARS-CoV-2 target nucleic acids are NOT DETECTED.  The SARS-CoV-2 RNA is generally detectable in upper respiratory specimens during the acute phase of infection. The lowest concentration of SARS-CoV-2 viral copies this assay can detect is 138 copies/mL. A negative result does not preclude SARS-Cov-2 infection and should not be used as the sole basis for treatment or other patient management decisions. A negative result may occur with  improper specimen collection/handling, submission of specimen other than nasopharyngeal swab, presence of viral mutation(s) within the areas targeted by this assay, and inadequate number of viral copies(<138 copies/mL). A negative result must be combined with clinical observations, patient history, and epidemiological information. The expected result is Negative.  Fact Sheet for Patients:  EntrepreneurPulse.com.au  Fact Sheet for Healthcare Providers:  IncredibleEmployment.be  This test is no t yet approved or cleared by the [place] FDA and  has been authorized for detection and/or diagnosis of SARS-CoV-2 by FDA under an Emergency Use Authorization (EUA). This EUA will remain  in effect (meaning this test can be used) for the duration of the COVID-19 declaration under Section 564(b)(1) of the Act, 21 U.S.C.section 360bbb-3(b)(1),  unless the authorization is terminated  or revoked sooner.       Influenza A by PCR NEGATIVE NEGATIVE Final   Influenza B by PCR NEGATIVE NEGATIVE Final    Comment: (NOTE) The Xpert Xpress SARS-CoV-2/FLU/RSV plus assay is intended as an aid in the diagnosis of influenza from Nasopharyngeal swab specimens and should not be used as a sole basis for treatment. Nasal washings and aspirates are unacceptable for Xpert Xpress SARS-CoV-2/FLU/RSV testing.  Fact Sheet for Patients: EntrepreneurPulse.com.au  Fact Sheet for Healthcare Providers: IncredibleEmployment.be  This test is not yet approved or cleared by the [place] FDA and has been authorized for detection and/or diagnosis of SARS-CoV-2 by FDA under an Emergency Use Authorization (EUA). This EUA will remain in effect (meaning this test can be used) for the duration of the COVID-19 declaration under Section 564(b)(1) of the Act, 21 U.S.C. section 360bbb-3(b)(1), unless the authorization is terminated or revoked.  Performed at [facility], [place] [address] [place], [place] [address]   MRSA Next Gen by PCR, Nasal     Status: None   Collection Time: 07/19/21  1:25 AM   Specimen: Nasal Mucosa; Nasal Swab  Result Value Ref Range Status   MRSA by PCR Next Gen NOT DETECTED NOT DETECTED Final    Comment: (NOTE) The GeneXpert MRSA Assay (FDA approved for NASAL specimens only), is one component of a comprehensive MRSA colonization surveillance program. It is not intended to diagnose MRSA infection nor to guide or monitor treatment for MRSA infections. Test performance is not FDA approved in patients less than 48 years old. Performed at [facility], [place] [address] [place], [place] [address]   Culture, Respiratory w Gram Stain     Status: None   Collection Time: 07/22/21  8:31 AM   Specimen: Tracheal Aspirate; Respiratory  Result Value Ref Range Status   Specimen Description  TRACHEAL ASPIRATE  Final   Special Requests NONE  Final   Gram Stain   Final    FEW WBC PRESENT,BOTH PMN AND MONONUCLEAR RARE GRAM NEGATIVE RODS    Culture   Final    FEW PSEUDOMONAS AERUGINOSA WITHIN MIXED ORGANISMS  Performed at [facility], [place] [address], [place], [place] [address]    Report Status 07/26/2021 FINAL  Final   Organism ID, Bacteria PSEUDOMONAS AERUGINOSA  Final      Susceptibility   Pseudomonas aeruginosa - MIC*    CEFTAZIDIME 4 SENSITIVE Sensitive     CIPROFLOXACIN >=4 RESISTANT Resistant     GENTAMICIN 4 SENSITIVE Sensitive     IMIPENEM 1 SENSITIVE Sensitive     * FEW PSEUDOMONAS AERUGINOSA    Labs: CBC: Recent Labs  Lab 07/30/21 0421 07/31/21 0313 08/01/21 0135 08/02/21 0126 08/03/21 0138  WBC 8.2 7.8 5.7 4.5 4.1  HGB 8.0* 8.3* 8.7* 8.7* 9.7*  HCT 25.7* 27.3* 27.9* 28.8* 30.9*  MCV 86.5 88.6 87.2 88.1 86.3  PLT 554* 683* 698* 674* 335*   Basic Metabolic Panel: Recent Labs  Lab 07/30/21 0421 07/31/21 0313 08/01/21 0135 08/02/21 0126 08/03/21 0138  NA 140 141 139 139 140  K 4.1 4.1 4.0 4.0 3.7  CL 110 109 108 109 109  CO2 '24 25 24 23 23  '$ GLUCOSE 114* 100* 112* 114* 104*  BUN 22* 28* 25* 24* 25*  CREATININE 0.37* 0.45* 0.41* 0.41* 0.44*  CALCIUM 8.5* 8.6* 8.6* 8.5* 8.8*  MG 2.2 2.1 2.1 2.0 2.0  PHOS 3.3 3.3 3.2 3.2 3.1   Liver Function Tests: Recent Labs  Lab 08/02/21 0126  AST 15  ALT 20  ALKPHOS 142*  BILITOT 0.2*  PROT 6.9  ALBUMIN 2.6*   CBG: Recent Labs  Lab 08/03/21 1635 08/03/21 2037 08/04/21 0043 08/04/21 0406 08/04/21 0800  GLUCAP 113* 86 114* 95 91    Discharge time spent: greater than 30 minutes.  Signed: Flora Lipps, MD [facility] 08/04/2021

## 2021-08-15 ENCOUNTER — Other Ambulatory Visit: Payer: Medicaid Other | Admitting: *Deleted

## 2021-08-15 ENCOUNTER — Other Ambulatory Visit (HOSPITAL_COMMUNITY): Payer: Self-pay

## 2021-08-15 ENCOUNTER — Other Ambulatory Visit: Payer: Medicaid Other

## 2021-08-15 DIAGNOSIS — Z515 Encounter for palliative care: Secondary | ICD-10-CM

## 2021-08-21 NOTE — Progress Notes (Signed)
[facility] COMMUNITY PALLIATIVE CARE RN NOTE  PATIENT NAME: Terry Palmer DOB: 07-03-1975 MRN: [id number]  PRIMARY CARE PROVIDER: Alvester Chou, NP  RESPONSIBLE PARTY: Lincoln Brigham (wife) Acct ID - Guarantor Home Phone Work Phone Relationship Acct Type  [id number] EMMET, MESSER* [phone]  Self P/F     [address], [place], [place], [place] [address]    RN/SW visit completed with patient, wife [name] and hired LPN in the home s/p recent hospitalization. He was hospitalized from 07/18/21 to 08/04/21 with the primary issue being septic shock.  Patient is lying in bed with eyes open. No physical indicators of pain noted. Respirations are even, regular and unlabored.  No coughing or congestion noted. He currently receives Jevity 1.5, 3 cans daily infusion via peg tube at 40cc/hr. A few days ago, nurse was getting 30-40cc's of residual from feeding tube, but has not had that issue since then. Their biggest concern is him not being able to evacuate his stools. He is taking Miralax and stool softeners. His stool is soft by hired nurse is unable to digitally remove. She has given an enema along with glycerin suppository which was ineffective. Abdomen is soft, round and non-distended. Bowel sounds present in all 4 quadrants. Recommended trying Dulcolax suppositories. Gave report to Dr. Mariea Clonts who agreed. Called patient's pharmacy, [facility] [address], and ordered. Left wife a voicemail making her aware.   CODE STATUS: Full code ADVANCED DIRECTIVES: N MOST FORM: yes PPS: 10%    Daryl Eastern, RN BSN

## 2021-08-24 NOTE — Progress Notes (Signed)
COMMUNITY PALLIATIVE CARE SW NOTE  PATIENT NAME: Terry Palmer DOB: 1975-06-16 MRN: [id number]  PRIMARY CARE PROVIDER: Alvester Chou, NP  RESPONSIBLE PARTY:  Acct ID - Guarantor Home Phone Work Phone Relationship Acct Type  [id number] Terry Palmer, Terry [phone]  Self P/F     [address], [place], [place] [address]   SOCIAL WORK ENCOUNTER: Home Visit  Palliative Care SW and RN-Terry Palmer completed a visit with patient at his home. His wife-Terry Palmer and caregiver/LPN-Terry Palmer was present with him. The team completed a follow-up visit with patient as follow-up post hospitalization 07/18/21-08/04/21.    Patient was observed in bed, with his eyes opened. He is nonverbal. Patient did not appear to be in any pain or discomfort. Patient is bedbound and total care. He receives tube feedings. His caregiver expressed concern regarding patient not having regular BM. He has been administered Miralax, stool softeners and an enema. After consultation with Dr. Mariea Clonts, the Palliative RN received an order for Dulcolax.  No psychosocial issues presented during this visit. SW provided support and reassurance of support to wife/family.     CODE STATUS: Full Code ADVANCED DIRECTIVES: No MOST FORM COMPLETE:  Yes HOSPICE EDUCATION PROVIDED: No  Duration of visit and documentation: 60 minutes  Terry Chea, LCSW

## 2021-09-08 DEATH — deceased

## 2021-09-29 ENCOUNTER — Encounter: Payer: Self-pay | Admitting: Internal Medicine
